# Patient Record
Sex: Female | Born: 1935 | Race: White | Hispanic: No
Health system: Southern US, Community
[De-identification: ages and names within clinical notes are randomized; demographics above are authoritative.]

## PROBLEM LIST (undated history)

## (undated) DIAGNOSIS — Z8719 Personal history of other diseases of the digestive system: Secondary | ICD-10-CM

## (undated) DIAGNOSIS — I4891 Unspecified atrial fibrillation: Secondary | ICD-10-CM

## (undated) DIAGNOSIS — C801 Malignant (primary) neoplasm, unspecified: Secondary | ICD-10-CM

## (undated) DIAGNOSIS — Z860100 Personal history of colon polyps, unspecified: Secondary | ICD-10-CM

## (undated) DIAGNOSIS — R600 Localized edema: Secondary | ICD-10-CM

## (undated) DIAGNOSIS — M1990 Unspecified osteoarthritis, unspecified site: Secondary | ICD-10-CM

## (undated) DIAGNOSIS — K589 Irritable bowel syndrome without diarrhea: Secondary | ICD-10-CM

## (undated) DIAGNOSIS — T7840XA Allergy, unspecified, initial encounter: Secondary | ICD-10-CM

## (undated) DIAGNOSIS — I2510 Atherosclerotic heart disease of native coronary artery without angina pectoris: Secondary | ICD-10-CM

## (undated) DIAGNOSIS — E559 Vitamin D deficiency, unspecified: Secondary | ICD-10-CM

## (undated) DIAGNOSIS — N952 Postmenopausal atrophic vaginitis: Secondary | ICD-10-CM

## (undated) DIAGNOSIS — I340 Nonrheumatic mitral (valve) insufficiency: Secondary | ICD-10-CM

## (undated) DIAGNOSIS — N390 Urinary tract infection, site not specified: Secondary | ICD-10-CM

## (undated) DIAGNOSIS — E119 Type 2 diabetes mellitus without complications: Secondary | ICD-10-CM

## (undated) DIAGNOSIS — R300 Dysuria: Secondary | ICD-10-CM

## (undated) DIAGNOSIS — K529 Noninfective gastroenteritis and colitis, unspecified: Secondary | ICD-10-CM

## (undated) DIAGNOSIS — E876 Hypokalemia: Secondary | ICD-10-CM

## (undated) DIAGNOSIS — I10 Essential (primary) hypertension: Secondary | ICD-10-CM

## (undated) DIAGNOSIS — D510 Vitamin B12 deficiency anemia due to intrinsic factor deficiency: Secondary | ICD-10-CM

## (undated) DIAGNOSIS — C4490 Unspecified malignant neoplasm of skin, unspecified: Secondary | ICD-10-CM

## (undated) DIAGNOSIS — K219 Gastro-esophageal reflux disease without esophagitis: Secondary | ICD-10-CM

## (undated) DIAGNOSIS — R3129 Other microscopic hematuria: Secondary | ICD-10-CM

## (undated) DIAGNOSIS — N6019 Diffuse cystic mastopathy of unspecified breast: Secondary | ICD-10-CM

## (undated) DIAGNOSIS — B3731 Acute candidiasis of vulva and vagina: Secondary | ICD-10-CM

## (undated) DIAGNOSIS — Z7901 Long term (current) use of anticoagulants: Secondary | ICD-10-CM

## (undated) DIAGNOSIS — K649 Unspecified hemorrhoids: Secondary | ICD-10-CM

## (undated) DIAGNOSIS — M722 Plantar fascial fibromatosis: Secondary | ICD-10-CM

## (undated) DIAGNOSIS — L8990 Pressure ulcer of unspecified site, unspecified stage: Secondary | ICD-10-CM

## (undated) DIAGNOSIS — B373 Candidiasis of vulva and vagina: Secondary | ICD-10-CM

## (undated) DIAGNOSIS — E785 Hyperlipidemia, unspecified: Secondary | ICD-10-CM

## (undated) DIAGNOSIS — H409 Unspecified glaucoma: Secondary | ICD-10-CM

## (undated) DIAGNOSIS — G4733 Obstructive sleep apnea (adult) (pediatric): Secondary | ICD-10-CM

## (undated) HISTORY — PX: REFRACTIVE SURGERY: SHX103

## (undated) HISTORY — DX: Irritable bowel syndrome, unspecified: K58.9

## (undated) HISTORY — DX: Vitamin B12 deficiency anemia due to intrinsic factor deficiency: D51.0

## (undated) HISTORY — DX: Essential (primary) hypertension: I10

## (undated) HISTORY — DX: Personal history of other diseases of the digestive system: Z87.19

## (undated) HISTORY — DX: Unspecified glaucoma: H40.9

## (undated) HISTORY — PX: BREAST BIOPSY: SHX20

## (undated) HISTORY — DX: Vitamin D deficiency, unspecified: E55.9

## (undated) HISTORY — DX: Postmenopausal atrophic vaginitis: N95.2

## (undated) HISTORY — DX: Localized edema: R60.0

## (undated) HISTORY — DX: Atherosclerotic heart disease of native coronary artery without angina pectoris: I25.10

## (undated) HISTORY — DX: Hyperlipidemia, unspecified: E78.5

## (undated) HISTORY — DX: Personal history of colon polyps, unspecified: Z86.0100

## (undated) HISTORY — PX: PACEMAKER PLACEMENT: SHX43

## (undated) HISTORY — DX: Malignant (primary) neoplasm, unspecified: C80.1

## (undated) HISTORY — DX: Hypokalemia: E87.6

## (undated) HISTORY — DX: Plantar fascial fibromatosis: M72.2

## (undated) HISTORY — DX: Candidiasis of vulva and vagina: B37.3

## (undated) HISTORY — DX: Nonrheumatic mitral (valve) insufficiency: I34.0

## (undated) HISTORY — DX: Gastro-esophageal reflux disease without esophagitis: K21.9

## (undated) HISTORY — DX: Long term (current) use of anticoagulants: Z79.01

## (undated) HISTORY — PX: OTHER SURGICAL HISTORY: SHX169

## (undated) HISTORY — PX: ABDOMINAL SURGERY: SHX537

## (undated) HISTORY — DX: Acute candidiasis of vulva and vagina: B37.31

## (undated) HISTORY — DX: Personal history of colonic polyps: Z86.010

## (undated) HISTORY — DX: Unspecified osteoarthritis, unspecified site: M19.90

## (undated) HISTORY — DX: Unspecified atrial fibrillation: I48.91

## (undated) HISTORY — PX: ABDOMINAL HYSTERECTOMY: SHX81

## (undated) HISTORY — PX: APPENDECTOMY: SHX54

## (undated) HISTORY — DX: Pressure ulcer of unspecified site, unspecified stage: L89.90

## (undated) HISTORY — DX: Other microscopic hematuria: R31.29

## (undated) HISTORY — DX: Diffuse cystic mastopathy of unspecified breast: N60.19

## (undated) HISTORY — DX: Unspecified hemorrhoids: K64.9

## (undated) HISTORY — DX: Allergy, unspecified, initial encounter: T78.40XA

## (undated) HISTORY — DX: Unspecified malignant neoplasm of skin, unspecified: C44.90

## (undated) HISTORY — DX: Obstructive sleep apnea (adult) (pediatric): G47.33

## (undated) HISTORY — DX: Type 2 diabetes mellitus without complications: E11.9

## (undated) HISTORY — DX: Urinary tract infection, site not specified: N39.0

## (undated) HISTORY — DX: Noninfective gastroenteritis and colitis, unspecified: K52.9

## (undated) HISTORY — PX: COLECTOMY: SHX59

## (undated) HISTORY — DX: Dysuria: R30.0

---
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
## 2004-02-17 ENCOUNTER — Ambulatory Visit: Payer: Self-pay | Admitting: Unknown Physician Specialty

## 2004-03-19 ENCOUNTER — Ambulatory Visit: Payer: Self-pay | Admitting: Unknown Physician Specialty

## 2004-05-10 ENCOUNTER — Ambulatory Visit: Payer: Self-pay | Admitting: Cardiology

## 2004-09-05 ENCOUNTER — Ambulatory Visit: Payer: Self-pay | Admitting: Cardiology

## 2004-09-09 ENCOUNTER — Ambulatory Visit: Payer: Self-pay | Admitting: Cardiology

## 2004-09-20 ENCOUNTER — Ambulatory Visit: Payer: Self-pay | Admitting: Unknown Physician Specialty

## 2004-10-02 ENCOUNTER — Ambulatory Visit: Payer: Self-pay | Admitting: Cardiology

## 2004-12-31 ENCOUNTER — Ambulatory Visit: Payer: Self-pay | Admitting: Internal Medicine

## 2005-10-24 ENCOUNTER — Ambulatory Visit: Payer: Self-pay | Admitting: Unknown Physician Specialty

## 2005-11-28 ENCOUNTER — Other Ambulatory Visit: Payer: Self-pay

## 2005-11-28 ENCOUNTER — Inpatient Hospital Stay: Payer: Self-pay | Admitting: Internal Medicine

## 2005-11-28 HISTORY — PX: OTHER SURGICAL HISTORY: SHX169

## 2005-11-29 ENCOUNTER — Other Ambulatory Visit: Payer: Self-pay

## 2005-11-30 ENCOUNTER — Other Ambulatory Visit: Payer: Self-pay

## 2006-01-14 ENCOUNTER — Encounter: Payer: Self-pay | Admitting: Internal Medicine

## 2006-01-17 ENCOUNTER — Encounter: Payer: Self-pay | Admitting: Internal Medicine

## 2006-01-21 ENCOUNTER — Ambulatory Visit: Payer: Self-pay | Admitting: Internal Medicine

## 2006-01-30 ENCOUNTER — Ambulatory Visit: Payer: Self-pay | Admitting: Internal Medicine

## 2006-01-30 ENCOUNTER — Ambulatory Visit (HOSPITAL_COMMUNITY): Admission: RE | Admit: 2006-01-30 | Discharge: 2006-01-31 | Payer: Self-pay | Admitting: Internal Medicine

## 2006-02-12 ENCOUNTER — Ambulatory Visit: Payer: Self-pay

## 2006-05-15 ENCOUNTER — Encounter: Payer: Self-pay | Admitting: Internal Medicine

## 2006-05-19 ENCOUNTER — Encounter: Payer: Self-pay | Admitting: Internal Medicine

## 2006-06-04 ENCOUNTER — Ambulatory Visit: Payer: Self-pay | Admitting: Unknown Physician Specialty

## 2006-06-08 ENCOUNTER — Ambulatory Visit: Payer: Self-pay | Admitting: Internal Medicine

## 2006-06-19 ENCOUNTER — Encounter: Payer: Self-pay | Admitting: Internal Medicine

## 2006-07-30 ENCOUNTER — Ambulatory Visit: Payer: Self-pay | Admitting: Unknown Physician Specialty

## 2006-08-05 ENCOUNTER — Encounter: Payer: Self-pay | Admitting: Internal Medicine

## 2006-08-19 ENCOUNTER — Encounter: Payer: Self-pay | Admitting: Internal Medicine

## 2006-09-02 ENCOUNTER — Ambulatory Visit: Payer: Self-pay | Admitting: Unknown Physician Specialty

## 2006-09-22 ENCOUNTER — Inpatient Hospital Stay: Payer: Self-pay | Admitting: *Deleted

## 2006-09-22 ENCOUNTER — Other Ambulatory Visit: Payer: Self-pay

## 2006-11-03 ENCOUNTER — Ambulatory Visit: Payer: Self-pay | Admitting: Unknown Physician Specialty

## 2007-01-28 ENCOUNTER — Ambulatory Visit: Payer: Self-pay | Admitting: General Surgery

## 2007-02-04 ENCOUNTER — Ambulatory Visit: Payer: Self-pay | Admitting: General Surgery

## 2007-10-04 ENCOUNTER — Ambulatory Visit: Payer: Self-pay | Admitting: Unknown Physician Specialty

## 2007-10-07 LAB — HM COLONOSCOPY: HM Colonoscopy: NORMAL

## 2008-01-06 ENCOUNTER — Ambulatory Visit: Payer: Self-pay | Admitting: Unknown Physician Specialty

## 2008-12-25 ENCOUNTER — Ambulatory Visit: Payer: Self-pay | Admitting: Internal Medicine

## 2009-01-01 ENCOUNTER — Ambulatory Visit: Payer: Self-pay

## 2009-01-17 ENCOUNTER — Ambulatory Visit: Payer: Self-pay | Admitting: Unknown Physician Specialty

## 2009-01-26 ENCOUNTER — Ambulatory Visit: Payer: Self-pay | Admitting: Urology

## 2010-03-05 ENCOUNTER — Ambulatory Visit: Payer: Self-pay | Admitting: Family Medicine

## 2010-05-19 LAB — HM MAMMOGRAPHY: HM Mammogram: NORMAL

## 2011-03-13 ENCOUNTER — Ambulatory Visit: Payer: Self-pay | Admitting: Unknown Physician Specialty

## 2011-04-08 LAB — BASIC METABOLIC PANEL
BUN: 19 mg/dL (ref 4–21)
Creatinine: 0.8 mg/dL (ref <=1.1)
Glucose: 119 mg/dL
Potassium: 3.7 mmol/L (ref 3.4–5.3)
Sodium: 136 mmol/L — AB (ref 137–147)

## 2011-04-08 LAB — HEMOGLOBIN A1C: Hgb A1c MFr Bld: 6.6 % — AB (ref 4.0–6.0)

## 2011-05-26 ENCOUNTER — Ambulatory Visit: Payer: Self-pay | Admitting: Internal Medicine

## 2011-05-28 DIAGNOSIS — I4891 Unspecified atrial fibrillation: Secondary | ICD-10-CM | POA: Diagnosis not present

## 2011-06-02 ENCOUNTER — Ambulatory Visit: Payer: Self-pay | Admitting: Internal Medicine

## 2011-06-04 DIAGNOSIS — D485 Neoplasm of uncertain behavior of skin: Secondary | ICD-10-CM | POA: Diagnosis not present

## 2011-06-04 DIAGNOSIS — Z85828 Personal history of other malignant neoplasm of skin: Secondary | ICD-10-CM | POA: Diagnosis not present

## 2011-06-04 DIAGNOSIS — L989 Disorder of the skin and subcutaneous tissue, unspecified: Secondary | ICD-10-CM | POA: Diagnosis not present

## 2011-06-13 ENCOUNTER — Ambulatory Visit (INDEPENDENT_AMBULATORY_CARE_PROVIDER_SITE_OTHER): Payer: Medicare Other | Admitting: Internal Medicine

## 2011-06-13 ENCOUNTER — Encounter: Payer: Self-pay | Admitting: Internal Medicine

## 2011-06-13 VITALS — BP 128/62 | HR 76 | Temp 97.8°F | Ht 62.0 in | Wt 148.0 lb

## 2011-06-13 DIAGNOSIS — I4821 Permanent atrial fibrillation: Secondary | ICD-10-CM | POA: Insufficient documentation

## 2011-06-13 DIAGNOSIS — R197 Diarrhea, unspecified: Secondary | ICD-10-CM

## 2011-06-13 DIAGNOSIS — R300 Dysuria: Secondary | ICD-10-CM

## 2011-06-13 DIAGNOSIS — D6869 Other thrombophilia: Secondary | ICD-10-CM | POA: Insufficient documentation

## 2011-06-13 DIAGNOSIS — K529 Noninfective gastroenteritis and colitis, unspecified: Secondary | ICD-10-CM | POA: Insufficient documentation

## 2011-06-13 DIAGNOSIS — I4891 Unspecified atrial fibrillation: Secondary | ICD-10-CM | POA: Diagnosis not present

## 2011-06-13 DIAGNOSIS — Z7901 Long term (current) use of anticoagulants: Secondary | ICD-10-CM | POA: Diagnosis not present

## 2011-06-13 DIAGNOSIS — N390 Urinary tract infection, site not specified: Secondary | ICD-10-CM | POA: Insufficient documentation

## 2011-06-13 DIAGNOSIS — I2510 Atherosclerotic heart disease of native coronary artery without angina pectoris: Secondary | ICD-10-CM | POA: Insufficient documentation

## 2011-06-13 LAB — POCT URINALYSIS DIPSTICK
Bilirubin, UA: NEGATIVE
Glucose, UA: NEGATIVE
Ketones, UA: NEGATIVE
Nitrite, UA: NEGATIVE
Protein, UA: NEGATIVE
Spec Grav, UA: 1.015
Urobilinogen, UA: 0.2
pH, UA: 7

## 2011-06-13 MED ORDER — CIPROFLOXACIN HCL 250 MG PO TABS: 250.0000 mg | ORAL_TABLET | Freq: Two times a day (BID) | ORAL | Status: AC

## 2011-06-13 NOTE — Assessment & Plan Note (Signed)
Patient reports stent placement in the past. Will obtain records from her previous physician. Blood pressure is well-controlled today. Will request records on recent lipid profile.

## 2011-06-13 NOTE — Assessment & Plan Note (Signed)
Symptoms and urinalysis consistent with urinary tract infection. Will send urine for culture. Will treat with Cipro. We discussed the use of antibiotics including Cipro can alter INR level. She will call immediately if any bleeding, or she has persistent dysuria, frequency, fever, or chills.

## 2011-06-13 NOTE — Assessment & Plan Note (Signed)
Symptoms seem most consistent with lactose intolerance. Patient reports extensive workup including colonoscopy and lab testing. Will obtain records from her previous physician.

## 2011-06-13 NOTE — Assessment & Plan Note (Signed)
Chronic anticoagulation with Coumadin secondary to atrial fibrillation. Goal INR 2-3. Will check INR as scheduled next week. We are starting antibiotics for urinary tract infection, and patient was cautioned to look out for bruising or bleeding.

## 2011-06-13 NOTE — Progress Notes (Signed)
Subjective:    Patient ID: Kara [name], female    DOB: September 07, 1922, 78 y.o.   MRN: [id number]  HPI 78 year old female with history of CAD, atrial fibrillation on chronic Coumadin therapy, hypertension, GERD, chronic diarrhea presents to establish care. Her physician recently left the area and she would like to transfer her care. She reports that generally she is feeling well. In regards to her atrial fibrillation, she notes that once or twice per week she has episodes of palpitations. During these episodes, she typically feels fatigued and short of breath. She does not have chest pain. She takes an extra dose of metoprolol at that time. She is currently followed by cardiology. She has a pacemaker in place. She notes that she has a history of coronary artery disease and is status post stent placement in the past. She is not sure which vessel this was in.  In regards to her hypertension, she notes that her blood pressure is generally well controlled. She denies any headache, chest pain.  In regards to her chronic diarrhea, she notes that almost on a daily basis she has loose stools. Her loose stools are made worse when she consumes dairy products. She has tried to eliminate dairy products and is using, and milk. However, she continues to eat a significant amount of cheese. She describes her stools as watery. Her stools are not bloody. She reports extensive GI evaluation including colonoscopy which was normal.  Outpatient Encounter Prescriptions as of 06/13/2011  Medication Sig Dispense Refill  . Calcium Carbonate-Vitamin D (CALCIUM-D) 600-400 MG-UNIT TABS Take 1 tablet by mouth daily.      . cholecalciferol (VITAMIN D) 1000 UNITS tablet Take 1,000 Units by mouth daily.      . cyanocobalamin (,VITAMIN B-12,) 1000 MCG/ML injection Inject 1,000 mcg into the muscle every 30 (thirty) days.      . metoprolol succinate (TOPROL-XL) 25 MG 24 hr tablet Take 25 mg by mouth daily.      . metoprolol tartrate  (LOPRESSOR) 25 MG tablet Take 25 mg by mouth daily as needed. Tachycardia      . pantoprazole (PROTONIX) 20 MG tablet Take 20 mg by mouth daily.      . potassium chloride (MICRO-K) 10 MEQ CR capsule Take 10 mEq by mouth 2 (two) times daily.      Marland Kitchen triamterene-hydrochlorothiazide (MAXZIDE-25) 37.5-25 MG per tablet Take 1 tablet by mouth daily.      Marland Kitchen warfarin (COUMADIN) 2 MG tablet Take 2 mg by mouth daily. 2 mg 4 days a week and 1 mg 3 days a week      . ciprofloxacin (CIPRO) 250 MG tablet Take 1 tablet (250 mg total) by mouth 2 (two) times daily.  14 tablet  0    Review of Systems  Constitutional: Negative for fever, chills, appetite change, fatigue and unexpected weight change.  HENT: Negative for ear pain, congestion, sore throat, trouble swallowing, neck pain, voice change and sinus pressure.   Eyes: Negative for visual disturbance.  Respiratory: Negative for cough, shortness of breath, wheezing and stridor.   Cardiovascular: Negative for chest pain, palpitations and leg swelling.  Gastrointestinal: Positive for diarrhea. Negative for nausea, vomiting, abdominal pain, constipation, blood in stool, abdominal distention and anal bleeding.  Genitourinary: Negative for dysuria and flank pain.  Musculoskeletal: Negative for myalgias, arthralgias and gait problem.  Skin: Negative for color change and rash.  Neurological: Negative for dizziness and headaches.  Hematological: Negative for adenopathy. Does not bruise/bleed easily.  Psychiatric/Behavioral:  Negative for suicidal ideas, sleep disturbance and dysphoric mood. The patient is not nervous/anxious.       BP 128/62  Pulse 76  Temp(Src) 97.8 F (36.6 C) (Oral)  Ht 5\' 2"  (1.575 m)  Wt 148 lb (67.132 kg)  BMI 27.07 kg/m2  SpO2 94%  Objective:   Physical Exam  Constitutional: She is oriented to person, place, and time. She appears well-developed and well-nourished. No distress.  HENT:  Head: Normocephalic and atraumatic.  Right  Ear: External ear normal.  Left Ear: External ear normal.  Nose: Nose normal.  Mouth/Throat: Oropharynx is clear and moist. No oropharyngeal exudate.  Eyes: Conjunctivae are normal. Pupils are equal, round, and reactive to light. Right eye exhibits no discharge. Left eye exhibits no discharge. No scleral icterus.  Neck: Normal range of motion. Neck supple. No tracheal deviation present. No thyromegaly present.  Cardiovascular: Normal rate, regular rhythm, normal heart sounds and intact distal pulses.  Exam reveals no gallop and no friction rub.   No murmur heard. Pulmonary/Chest: Effort normal and breath sounds normal. No respiratory distress. She has no wheezes. She has no rales. She exhibits no tenderness.  Abdominal: Soft. Bowel sounds are normal. She exhibits no distension and no mass. There is no tenderness. There is no rebound and no guarding.  Musculoskeletal: Normal range of motion. She exhibits no edema and no tenderness.  Lymphadenopathy:    She has no cervical adenopathy.  Neurological: She is alert and oriented to person, place, and time. No cranial nerve deficit. She exhibits normal muscle tone. Coordination normal.  Skin: Skin is warm and dry. No rash noted. She is not diaphoretic. No erythema. No pallor.  Psychiatric: She has a normal mood and affect. Her behavior is normal. Judgment and thought content normal.          Assessment & Plan:

## 2011-06-13 NOTE — Assessment & Plan Note (Signed)
Patient with intermittent atrial fibrillation. She reports this occurs once or twice per week. She is currently rate controlled with metoprolol. She is followed by cardiology. She is on warfarin. Will obtain records from her previous physician. Will continue warfarin and plan to check INR as scheduled next week.

## 2011-06-15 LAB — URINE CULTURE: Colony Count: 1000

## 2011-06-19 DIAGNOSIS — I495 Sick sinus syndrome: Secondary | ICD-10-CM | POA: Diagnosis not present

## 2011-06-23 ENCOUNTER — Other Ambulatory Visit (INDEPENDENT_AMBULATORY_CARE_PROVIDER_SITE_OTHER): Payer: Medicare Other | Admitting: *Deleted

## 2011-06-23 DIAGNOSIS — Z7901 Long term (current) use of anticoagulants: Secondary | ICD-10-CM | POA: Diagnosis not present

## 2011-06-23 LAB — PROTIME-INR
INR: 2.1 ratio — ABNORMAL HIGH (ref 0.8–1.0)
Prothrombin Time: 22.7 s — ABNORMAL HIGH (ref 10.2–12.4)

## 2011-06-25 ENCOUNTER — Telehealth: Payer: Self-pay | Admitting: Internal Medicine

## 2011-06-25 NOTE — Telephone Encounter (Signed)
Discussed w/pt, see result note

## 2011-06-25 NOTE — Telephone Encounter (Signed)
Wanting to know when she should come back for her her next pro time. Patient also wants to now her lab results.

## 2011-07-23 ENCOUNTER — Other Ambulatory Visit (INDEPENDENT_AMBULATORY_CARE_PROVIDER_SITE_OTHER): Payer: Medicare Other | Admitting: *Deleted

## 2011-07-23 DIAGNOSIS — I4891 Unspecified atrial fibrillation: Secondary | ICD-10-CM | POA: Diagnosis not present

## 2011-07-23 DIAGNOSIS — Z7901 Long term (current) use of anticoagulants: Secondary | ICD-10-CM

## 2011-07-23 LAB — PROTIME-INR
INR: 2.36 — ABNORMAL HIGH (ref <1.50)
Prothrombin Time: 26.6 seconds — ABNORMAL HIGH (ref 11.6–15.2)

## 2011-07-30 ENCOUNTER — Encounter: Payer: Self-pay | Admitting: Internal Medicine

## 2011-07-30 DIAGNOSIS — E559 Vitamin D deficiency, unspecified: Secondary | ICD-10-CM | POA: Insufficient documentation

## 2011-08-01 ENCOUNTER — Telehealth: Payer: Self-pay | Admitting: Internal Medicine

## 2011-08-01 MED ORDER — CYANOCOBALAMIN 1000 MCG/ML IJ SOLN: 1000.0000 ug | INTRAMUSCULAR | Status: DC

## 2011-08-01 NOTE — Telephone Encounter (Signed)
Done,  Patient informed

## 2011-08-01 NOTE — Telephone Encounter (Signed)
Pt went to pick up her b12 rx and it was not at the drug store.  Please advise when this is called in [facility]   Pt wanted to get the results of the urine test she had done last week [phone]

## 2011-08-14 ENCOUNTER — Other Ambulatory Visit (INDEPENDENT_AMBULATORY_CARE_PROVIDER_SITE_OTHER): Payer: Medicare Other | Admitting: *Deleted

## 2011-08-14 DIAGNOSIS — Z7901 Long term (current) use of anticoagulants: Secondary | ICD-10-CM | POA: Diagnosis not present

## 2011-08-14 LAB — PROTIME-INR
INR: 2.1 ratio — ABNORMAL HIGH (ref 0.8–1.0)
Prothrombin Time: 23.6 s — ABNORMAL HIGH (ref 10.2–12.4)

## 2011-08-20 ENCOUNTER — Telehealth: Payer: Self-pay | Admitting: *Deleted

## 2011-08-20 NOTE — Telephone Encounter (Signed)
Patient never got a call back about her PT/INR from last week. It looks like it was not routed to you. Please advise.

## 2011-08-20 NOTE — Telephone Encounter (Signed)
Patient notified. Lab appt scheduled for one month.

## 2011-08-20 NOTE — Telephone Encounter (Signed)
INR was perfect. 2.1 on 3/28.  Can repeat in 66month. It was not routed to me. Please apologize to pt for me. Not sure why not routed correctly.

## 2011-08-27 DIAGNOSIS — E782 Mixed hyperlipidemia: Secondary | ICD-10-CM | POA: Diagnosis not present

## 2011-08-27 DIAGNOSIS — I059 Rheumatic mitral valve disease, unspecified: Secondary | ICD-10-CM | POA: Diagnosis not present

## 2011-08-27 DIAGNOSIS — I119 Hypertensive heart disease without heart failure: Secondary | ICD-10-CM | POA: Diagnosis not present

## 2011-08-27 DIAGNOSIS — I4891 Unspecified atrial fibrillation: Secondary | ICD-10-CM | POA: Diagnosis not present

## 2011-09-08 ENCOUNTER — Other Ambulatory Visit (INDEPENDENT_AMBULATORY_CARE_PROVIDER_SITE_OTHER): Payer: Medicare Other | Admitting: *Deleted

## 2011-09-08 ENCOUNTER — Telehealth: Payer: Self-pay | Admitting: *Deleted

## 2011-09-08 DIAGNOSIS — Z7901 Long term (current) use of anticoagulants: Secondary | ICD-10-CM

## 2011-09-08 DIAGNOSIS — N390 Urinary tract infection, site not specified: Secondary | ICD-10-CM

## 2011-09-08 LAB — POCT URINALYSIS DIPSTICK
Bilirubin, UA: NEGATIVE
Blood, UA: NEGATIVE
Glucose, UA: NEGATIVE
Ketones, UA: NEGATIVE
Nitrite, UA: NEGATIVE
Protein, UA: NEGATIVE
Spec Grav, UA: 1.015
Urobilinogen, UA: 0.2
pH, UA: 7

## 2011-09-08 LAB — PROTIME-INR
INR: 2.7 ratio — ABNORMAL HIGH (ref 0.8–1.0)
Prothrombin Time: 29.4 s — ABNORMAL HIGH (ref 10.2–12.4)

## 2011-09-08 NOTE — Telephone Encounter (Signed)
Patient came in today for labs, while she was here she complained of having a lot burning with urination. She left urine specimen. Results are in. I sent for culture.

## 2011-09-08 NOTE — Telephone Encounter (Signed)
Please send for culture. Treat with Cipro 250mg  po bid x 5 days (confirm no allergy)

## 2011-09-08 NOTE — Telephone Encounter (Signed)
Left message asking patient to call back

## 2011-09-08 NOTE — Progress Notes (Signed)
Addended by: Jobie Quaker on: 09/08/2011 03:01 PM   Modules accepted: Orders

## 2011-09-09 ENCOUNTER — Telehealth: Payer: Self-pay | Admitting: *Deleted

## 2011-09-09 MED ORDER — CIPROFLOXACIN HCL 250 MG PO TABS: 250.0000 mg | ORAL_TABLET | Freq: Two times a day (BID) | ORAL | Status: DC

## 2011-09-09 NOTE — Telephone Encounter (Signed)
Patient notified- Rx called to pharmacy. 

## 2011-09-09 NOTE — Telephone Encounter (Signed)
Patient called back regarding her protime appt that was scheduled for Friday. She says that she was mistaken and will not be

## 2011-09-09 NOTE — Telephone Encounter (Signed)
She was mistaken and will not be leaving to go out of town until Tuesday of next week. She reschedule pt/inr for Monday of next week.

## 2011-09-10 DIAGNOSIS — I4891 Unspecified atrial fibrillation: Secondary | ICD-10-CM | POA: Diagnosis not present

## 2011-09-10 DIAGNOSIS — I059 Rheumatic mitral valve disease, unspecified: Secondary | ICD-10-CM | POA: Diagnosis not present

## 2011-09-10 LAB — URINE CULTURE: Colony Count: 8000

## 2011-09-11 ENCOUNTER — Ambulatory Visit (INDEPENDENT_AMBULATORY_CARE_PROVIDER_SITE_OTHER): Payer: Medicare Other | Admitting: Internal Medicine

## 2011-09-11 ENCOUNTER — Encounter: Payer: Self-pay | Admitting: Internal Medicine

## 2011-09-11 VITALS — BP 100/60 | HR 68 | Ht 62.0 in | Wt 148.0 lb

## 2011-09-11 DIAGNOSIS — I10 Essential (primary) hypertension: Secondary | ICD-10-CM | POA: Diagnosis not present

## 2011-09-11 DIAGNOSIS — E119 Type 2 diabetes mellitus without complications: Secondary | ICD-10-CM | POA: Diagnosis not present

## 2011-09-11 DIAGNOSIS — I4891 Unspecified atrial fibrillation: Secondary | ICD-10-CM

## 2011-09-11 DIAGNOSIS — N390 Urinary tract infection, site not specified: Secondary | ICD-10-CM

## 2011-09-11 DIAGNOSIS — Z7901 Long term (current) use of anticoagulants: Secondary | ICD-10-CM | POA: Diagnosis not present

## 2011-09-11 DIAGNOSIS — D510 Vitamin B12 deficiency anemia due to intrinsic factor deficiency: Secondary | ICD-10-CM | POA: Diagnosis not present

## 2011-09-11 NOTE — Assessment & Plan Note (Signed)
Recent Coumadin level was therapeutic. We'll plan to repeat INR with labs in May 2013.

## 2011-09-11 NOTE — Progress Notes (Signed)
Subjective:    Patient ID: Kara [name], female    DOB: [birth year], 78 y.o.   MRN: [id number]  HPI 78 year old female with history of atrial fibrillation presents for followup. She reports she is generally doing well. She notes that yesterday she had run of atrial fibrillation and was fatigued. She denies chest pain during that event. She did have palpitations. The episode has since resolved. She continues on anticoagulation with Coumadin. Her last Coumadin level was therapeutic.  Otherwise, she reports she is feeling well. She notes she is planning to travel in the upcoming weeks.  She was seen earlier this week with symptoms of dysuria. Urine culture returned and was negative. We discussed stopping Cipro today.  Outpatient Encounter Prescriptions as of 09/11/2011  Medication Sig Dispense Refill  . Calcium Carbonate-Vitamin D (CALCIUM-D) 600-400 MG-UNIT TABS Take 1 tablet by mouth daily.      . cholecalciferol (VITAMIN D) 1000 UNITS tablet Take 1,000 Units by mouth daily.      . cyanocobalamin (,VITAMIN B-12,) 1000 MCG/ML injection Inject 1 mL (1,000 mcg total) into the muscle every 30 (thirty) days.  10 mL  1  . metoprolol succinate (TOPROL-XL) 25 MG 24 hr tablet Take 25 mg by mouth daily.      . metoprolol tartrate (LOPRESSOR) 25 MG tablet Take 25 mg by mouth daily as needed. Tachycardia      . pantoprazole (PROTONIX) 20 MG tablet Take 20 mg by mouth daily.      . potassium chloride (MICRO-K) 10 MEQ CR capsule Take 10 mEq by mouth 2 (two) times daily.      Marland Kitchen triamterene-hydrochlorothiazide (MAXZIDE-25) 37.5-25 MG per tablet Take 1 tablet by mouth daily.      Marland Kitchen warfarin (COUMADIN) 2 MG tablet Take 2 mg by mouth daily. 2 mg 4 days a week and 1 mg 3 days a week      . DISCONTD: ciprofloxacin (CIPRO) 250 MG tablet Take 1 tablet (250 mg total) by mouth 2 (two) times daily.  10 tablet  0    Review of Systems  Constitutional: Positive for fatigue. Negative for fever, chills, appetite change and  unexpected weight change.  HENT: Negative for ear pain, congestion, sore throat, trouble swallowing, neck pain, voice change and sinus pressure.   Eyes: Negative for visual disturbance.  Respiratory: Negative for cough, shortness of breath, wheezing and stridor.   Cardiovascular: Positive for palpitations. Negative for chest pain and leg swelling.  Gastrointestinal: Negative for nausea, vomiting, abdominal pain, diarrhea, constipation, blood in stool, abdominal distention and anal bleeding.  Genitourinary: Negative for dysuria and flank pain.  Musculoskeletal: Negative for myalgias, arthralgias and gait problem.  Skin: Negative for color change and rash.  Neurological: Negative for dizziness and headaches.  Hematological: Negative for adenopathy. Does not bruise/bleed easily.  Psychiatric/Behavioral: Negative for suicidal ideas, sleep disturbance and dysphoric mood. The patient is not nervous/anxious.    BP 100/60  Pulse 68  Ht 5\' 2"  (1.575 m)  Wt 148 lb (67.132 kg)  BMI 27.07 kg/m2     Objective:   Physical Exam  Constitutional: She is oriented to person, place, and time. She appears well-developed and well-nourished. No distress.  HENT:  Head: Normocephalic and atraumatic.  Right Ear: External ear normal.  Left Ear: External ear normal.  Nose: Nose normal.  Mouth/Throat: Oropharynx is clear and moist. No oropharyngeal exudate.  Eyes: Conjunctivae are normal. Pupils are equal, round, and reactive to light. Right eye exhibits no discharge. Left  eye exhibits no discharge. No scleral icterus.  Neck: Normal range of motion. Neck supple. No tracheal deviation present. No thyromegaly present.  Cardiovascular: Normal rate, regular rhythm, normal heart sounds and intact distal pulses.  Exam reveals no gallop and no friction rub.   No murmur heard. Pulmonary/Chest: Effort normal and breath sounds normal. No respiratory distress. She has no wheezes. She has no rales. She exhibits no  tenderness.  Musculoskeletal: Normal range of motion. She exhibits no edema and no tenderness.  Lymphadenopathy:    She has no cervical adenopathy.  Neurological: She is alert and oriented to person, place, and time. No cranial nerve deficit. She exhibits normal muscle tone. Coordination normal.  Skin: Skin is warm and dry. No rash noted. She is not diaphoretic. No erythema. No pallor.  Psychiatric: She has a normal mood and affect. Her behavior is normal. Judgment and thought content normal.          Assessment & Plan:

## 2011-09-11 NOTE — Assessment & Plan Note (Signed)
Currently appears to be in normal sinus rhythm. However, reports episode of atrial fibrillation yesterday for which she took metoprolol with resolution. She continues on anticoagulation with Coumadin. Last Coumadin level 09/08/2011 was therapeutic. She will repeat Coumadin level in one month.

## 2011-09-11 NOTE — Patient Instructions (Signed)
Plan to recheck coumadin level on 5/22.

## 2011-09-11 NOTE — Assessment & Plan Note (Signed)
Symptoms including minimal dysuria have resolved. Urine culture was negative. Will stop Cipro. Will monitor for any recurrent symptoms.

## 2011-09-12 ENCOUNTER — Other Ambulatory Visit: Payer: Medicare Other

## 2011-09-15 ENCOUNTER — Other Ambulatory Visit: Payer: Medicare Other

## 2011-10-14 ENCOUNTER — Other Ambulatory Visit (INDEPENDENT_AMBULATORY_CARE_PROVIDER_SITE_OTHER): Payer: Medicare Other | Admitting: *Deleted

## 2011-10-14 DIAGNOSIS — I10 Essential (primary) hypertension: Secondary | ICD-10-CM

## 2011-10-14 DIAGNOSIS — D510 Vitamin B12 deficiency anemia due to intrinsic factor deficiency: Secondary | ICD-10-CM

## 2011-10-14 DIAGNOSIS — Z7901 Long term (current) use of anticoagulants: Secondary | ICD-10-CM | POA: Diagnosis not present

## 2011-10-14 DIAGNOSIS — IMO0001 Reserved for inherently not codable concepts without codable children: Secondary | ICD-10-CM | POA: Diagnosis not present

## 2011-10-14 LAB — CBC WITH DIFFERENTIAL/PLATELET
Basophils Absolute: 0 10*3/uL (ref 0.0–0.1)
Basophils Relative: 0.6 % (ref 0.0–3.0)
Eosinophils Absolute: 0.1 10*3/uL (ref 0.0–0.7)
Eosinophils Relative: 1.4 % (ref 0.0–5.0)
HCT: 42.4 % (ref 36.0–46.0)
Hemoglobin: 14.3 g/dL (ref 12.0–15.0)
Lymphocytes Relative: 39.2 % (ref 12.0–46.0)
Lymphs Abs: 2.5 10*3/uL (ref 0.7–4.0)
MCHC: 33.7 g/dL (ref 30.0–36.0)
MCV: 91.3 fl (ref 78.0–100.0)
Monocytes Absolute: 0.3 10*3/uL (ref 0.1–1.0)
Monocytes Relative: 5.5 % (ref 3.0–12.0)
Neutro Abs: 3.4 10*3/uL (ref 1.4–7.7)
Neutrophils Relative %: 53.3 % (ref 43.0–77.0)
Platelets: 194 10*3/uL (ref 150.0–400.0)
RBC: 4.65 Mil/uL (ref 3.87–5.11)
RDW: 13.1 % (ref 11.5–14.6)
WBC: 6.3 10*3/uL (ref 4.5–10.5)

## 2011-10-14 LAB — PROTIME-INR
INR: 2.1 ratio — ABNORMAL HIGH (ref 0.8–1.0)
Prothrombin Time: 23.3 s — ABNORMAL HIGH (ref 10.2–12.4)

## 2011-10-14 LAB — LIPID PANEL
Cholesterol: 218 mg/dL — ABNORMAL HIGH (ref 0–200)
HDL: 48.8 mg/dL (ref >39.00)
Total CHOL/HDL Ratio: 4
Triglycerides: 290 mg/dL — ABNORMAL HIGH (ref 0.0–149.0)
VLDL: 58 mg/dL — ABNORMAL HIGH (ref 0.0–40.0)

## 2011-10-14 LAB — COMPREHENSIVE METABOLIC PANEL
ALT: 14 U/L (ref 0–35)
AST: 21 U/L (ref 0–37)
Albumin: 3.5 g/dL (ref 3.5–5.2)
Alkaline Phosphatase: 46 U/L (ref 39–117)
BUN: 25 mg/dL — ABNORMAL HIGH (ref 6–23)
CO2: 24 mEq/L (ref 19–32)
Calcium: 8.8 mg/dL (ref 8.4–10.5)
Chloride: 103 mEq/L (ref 96–112)
Creatinine, Ser: 0.8 mg/dL (ref 0.4–1.2)
GFR: 69.11 mL/min (ref >60.00)
Glucose, Bld: 133 mg/dL — ABNORMAL HIGH (ref 70–99)
Potassium: 3.4 mEq/L — ABNORMAL LOW (ref 3.5–5.1)
Sodium: 139 mEq/L (ref 135–145)
Total Bilirubin: 1.2 mg/dL (ref 0.3–1.2)
Total Protein: 6.3 g/dL (ref 6.0–8.3)

## 2011-10-14 LAB — LDL CHOLESTEROL, DIRECT: Direct LDL: 113.7 mg/dL

## 2011-10-14 LAB — HEMOGLOBIN A1C: Hgb A1c MFr Bld: 6.4 % (ref 4.6–6.5)

## 2011-10-17 DIAGNOSIS — I10 Essential (primary) hypertension: Secondary | ICD-10-CM | POA: Diagnosis not present

## 2011-10-17 DIAGNOSIS — I739 Peripheral vascular disease, unspecified: Secondary | ICD-10-CM | POA: Diagnosis not present

## 2011-11-11 ENCOUNTER — Other Ambulatory Visit (INDEPENDENT_AMBULATORY_CARE_PROVIDER_SITE_OTHER): Payer: Medicare Other | Admitting: *Deleted

## 2011-11-11 DIAGNOSIS — Z23 Encounter for immunization: Secondary | ICD-10-CM | POA: Diagnosis not present

## 2011-11-11 DIAGNOSIS — Z7901 Long term (current) use of anticoagulants: Secondary | ICD-10-CM

## 2011-11-11 LAB — PROTIME-INR
INR: 2.2 ratio — ABNORMAL HIGH (ref 0.8–1.0)
Prothrombin Time: 24.4 s — ABNORMAL HIGH (ref 10.2–12.4)

## 2011-11-18 DIAGNOSIS — I495 Sick sinus syndrome: Secondary | ICD-10-CM | POA: Diagnosis not present

## 2011-11-24 ENCOUNTER — Other Ambulatory Visit: Payer: Self-pay | Admitting: Internal Medicine

## 2011-11-24 DIAGNOSIS — Z7901 Long term (current) use of anticoagulants: Secondary | ICD-10-CM

## 2011-11-28 ENCOUNTER — Telehealth: Payer: Self-pay | Admitting: Internal Medicine

## 2011-11-28 NOTE — Telephone Encounter (Signed)
Tylenol would be the safest option. If pain severe, then needs to be seen.

## 2011-11-28 NOTE — Telephone Encounter (Signed)
Caller: Juliza/Patient; PCP: Ronna Polio; CB#: [phone]; ; ; Call regarding Patient Twisted Her Back Yesterday and Still in Pain. Patient Is On Coumadin and Has To Be Careful What She Takes; Onset- 11/27/11 Afebrile. Pt states she twisted her back doing something in her closet and is having on/off back pain. Emergent s/s of Back s/s protocol r/o.  Pt to see provider within 2 weeks. Pt took Tylenol earlier and started to feel bad afterwards, she wants to know what she can take for pain since she is on Coumadin. [facility].

## 2011-11-28 NOTE — Telephone Encounter (Signed)
Patient advised as instructed via telephone. 

## 2011-11-29 ENCOUNTER — Emergency Department: Payer: Self-pay | Admitting: Emergency Medicine

## 2011-11-29 DIAGNOSIS — M545 Low back pain, unspecified: Secondary | ICD-10-CM | POA: Diagnosis not present

## 2011-11-29 DIAGNOSIS — R1031 Right lower quadrant pain: Secondary | ICD-10-CM | POA: Diagnosis not present

## 2011-11-29 DIAGNOSIS — K859 Acute pancreatitis without necrosis or infection, unspecified: Secondary | ICD-10-CM | POA: Diagnosis not present

## 2011-11-29 DIAGNOSIS — R197 Diarrhea, unspecified: Secondary | ICD-10-CM | POA: Diagnosis not present

## 2011-11-29 DIAGNOSIS — R112 Nausea with vomiting, unspecified: Secondary | ICD-10-CM | POA: Diagnosis not present

## 2011-11-29 DIAGNOSIS — R6889 Other general symptoms and signs: Secondary | ICD-10-CM | POA: Diagnosis not present

## 2011-11-29 LAB — URINALYSIS, COMPLETE
Bacteria: NONE SEEN
Bilirubin,UR: NEGATIVE
Glucose,UR: NEGATIVE mg/dL (ref 0–75)
Leukocyte Esterase: NEGATIVE
Nitrite: NEGATIVE
Ph: 7 (ref 4.5–8.0)
Protein: NEGATIVE
RBC,UR: 2 /HPF (ref 0–5)
Specific Gravity: 1.006 (ref 1.003–1.030)
Squamous Epithelial: <1
WBC UR: 1 /HPF (ref 0–5)

## 2011-11-29 LAB — CBC
HCT: 41.8 % (ref 35.0–47.0)
HGB: 13.8 g/dL (ref 12.0–16.0)
MCH: 30.4 pg (ref 26.0–34.0)
MCHC: 32.9 g/dL (ref 32.0–36.0)
MCV: 92 fL (ref 80–100)
Platelet: 171 10*3/uL (ref 150–440)
RBC: 4.53 10*6/uL (ref 3.80–5.20)
RDW: 13 % (ref 11.5–14.5)
WBC: 13.8 10*3/uL — ABNORMAL HIGH (ref 3.6–11.0)

## 2011-11-29 LAB — COMPREHENSIVE METABOLIC PANEL
Albumin: 3.3 g/dL — ABNORMAL LOW (ref 3.4–5.0)
Alkaline Phosphatase: 70 U/L (ref 50–136)
Anion Gap: 7 (ref 7–16)
BUN: 15 mg/dL (ref 7–18)
Bilirubin,Total: 1.9 mg/dL — ABNORMAL HIGH (ref 0.2–1.0)
Calcium, Total: 8.7 mg/dL (ref 8.5–10.1)
Chloride: 96 mmol/L — ABNORMAL LOW (ref 98–107)
Co2: 32 mmol/L (ref 21–32)
Creatinine: 0.93 mg/dL (ref 0.60–1.30)
EGFR (African American): >60
EGFR (Non-African Amer.): 55 — ABNORMAL LOW
Glucose: 141 mg/dL — ABNORMAL HIGH (ref 65–99)
Osmolality: 273 (ref 275–301)
Potassium: 3.2 mmol/L — ABNORMAL LOW (ref 3.5–5.1)
SGOT(AST): 23 U/L (ref 15–37)
SGPT (ALT): 21 U/L
Sodium: 135 mmol/L — ABNORMAL LOW (ref 136–145)
Total Protein: 7 g/dL (ref 6.4–8.2)

## 2011-11-29 LAB — LIPASE, BLOOD: Lipase: 802 U/L — ABNORMAL HIGH (ref 73–393)

## 2011-12-01 ENCOUNTER — Telehealth: Payer: Self-pay | Admitting: Internal Medicine

## 2011-12-01 ENCOUNTER — Encounter: Payer: Self-pay | Admitting: Internal Medicine

## 2011-12-01 ENCOUNTER — Ambulatory Visit (INDEPENDENT_AMBULATORY_CARE_PROVIDER_SITE_OTHER): Payer: Medicare Other | Admitting: Internal Medicine

## 2011-12-01 VITALS — BP 124/84 | HR 130 | Temp 98.6°F | Ht 62.0 in | Wt 145.5 lb

## 2011-12-01 DIAGNOSIS — R1031 Right lower quadrant pain: Secondary | ICD-10-CM | POA: Diagnosis not present

## 2011-12-01 DIAGNOSIS — K859 Acute pancreatitis without necrosis or infection, unspecified: Secondary | ICD-10-CM | POA: Diagnosis not present

## 2011-12-01 DIAGNOSIS — R109 Unspecified abdominal pain: Secondary | ICD-10-CM | POA: Diagnosis not present

## 2011-12-01 LAB — COMPREHENSIVE METABOLIC PANEL
Albumin: 2.6 g/dL — ABNORMAL LOW (ref 3.4–5.0)
Alkaline Phosphatase: 68 U/L (ref 50–136)
Anion Gap: 11 (ref 7–16)
BUN: 11 mg/dL (ref 7–18)
Bilirubin,Total: 1.8 mg/dL — ABNORMAL HIGH (ref 0.2–1.0)
Calcium, Total: 8.1 mg/dL — ABNORMAL LOW (ref 8.5–10.1)
Chloride: 100 mmol/L (ref 98–107)
Co2: 26 mmol/L (ref 21–32)
Creatinine: 0.76 mg/dL (ref 0.60–1.30)
EGFR (African American): >60
EGFR (Non-African Amer.): >60
Glucose: 151 mg/dL — ABNORMAL HIGH (ref 65–99)
Osmolality: 276 (ref 275–301)
Potassium: 3.1 mmol/L — ABNORMAL LOW (ref 3.5–5.1)
SGOT(AST): 19 U/L (ref 15–37)
SGPT (ALT): 15 U/L
Sodium: 137 mmol/L (ref 136–145)
Total Protein: 6.6 g/dL (ref 6.4–8.2)

## 2011-12-01 LAB — CBC WITH DIFFERENTIAL/PLATELET
Basophil #: 0 10*3/uL (ref 0.0–0.1)
Basophil %: 0.2 %
Eosinophil #: 0 10*3/uL (ref 0.0–0.7)
Eosinophil %: 0 %
HCT: 39.6 % (ref 35.0–47.0)
HGB: 13 g/dL (ref 12.0–16.0)
Lymphocyte #: 1.8 10*3/uL (ref 1.0–3.6)
Lymphocyte %: 12.5 %
MCH: 30.5 pg (ref 26.0–34.0)
MCHC: 32.9 g/dL (ref 32.0–36.0)
MCV: 93 fL (ref 80–100)
Monocyte #: 1.4 x10 3/mm — ABNORMAL HIGH (ref 0.2–0.9)
Monocyte %: 10 %
Neutrophil #: 10.9 10*3/uL — ABNORMAL HIGH (ref 1.4–6.5)
Neutrophil %: 77.3 %
Platelet: 163 10*3/uL (ref 150–440)
RBC: 4.26 10*6/uL (ref 3.80–5.20)
RDW: 13.2 % (ref 11.5–14.5)
WBC: 14.1 10*3/uL — ABNORMAL HIGH (ref 3.6–11.0)

## 2011-12-01 LAB — URINALYSIS, COMPLETE
Bacteria: NONE SEEN
Bilirubin,UR: NEGATIVE
Glucose,UR: NEGATIVE mg/dL (ref 0–75)
Leukocyte Esterase: NEGATIVE
Nitrite: NEGATIVE
Ph: 6 (ref 4.5–8.0)
Protein: NEGATIVE
RBC,UR: 5 /HPF (ref 0–5)
Specific Gravity: 1.011 (ref 1.003–1.030)
Squamous Epithelial: 1
WBC UR: 2 /HPF (ref 0–5)

## 2011-12-01 LAB — MAGNESIUM: Magnesium: 2 mg/dL

## 2011-12-01 LAB — LIPASE, BLOOD: Lipase: 55 U/L — ABNORMAL LOW

## 2011-12-01 LAB — PROTIME-INR
INR: 1.1
Prothrombin Time: 14.6 secs (ref 11.5–14.7)

## 2011-12-01 LAB — APTT: Activated PTT: 32.7 secs (ref 23.6–35.9)

## 2011-12-01 NOTE — Assessment & Plan Note (Signed)
With concurrent enteritis.  Patient should not have been sent home from ER on Saturday.  Have spoken with Dr. Monte Fantasia war, hospitalist on call who has arranged for immediate ER evaluation followed by admission for iv antibiotics IV fluids resuscitation and bowel rest.

## 2011-12-01 NOTE — Telephone Encounter (Signed)
Caller: Huntley Dec / patient; PCP: Ronna Polio; CB#: [phone]; Call regarding Having Abdominal Pain; Seen in ED 11/29/11.  Dx w/ pancreatic inflammation at ED. Pain up to 6 of 10 with movement.  Has not been monitoring blood glucose.  Advised see in 24 hours per Diabetes:  GI Problems protocol.  Appointment with Dr. Darrick Huntsman on 12/01/11 at 1615.

## 2011-12-01 NOTE — Progress Notes (Signed)
Patient ID: Kara [name], female   DOB: Nov 25, 1922, 78 y.o.   MRN: [id number] Patient Active Problem List  Diagnosis  . Chronic anticoagulation  . Urinary tract infection  . Atrial fibrillation  . CAD (coronary artery disease)  . Chronic diarrhea  . Vitamin d deficiency  . Pancreatitis, acute    Subjective:  CC:   Chief Complaint  Patient presents with  . Abdominal Pain    HPI:   Kara [name] a 78 y.o. female who presents Er follow up for visit on Saturday for sudden onset of nausea and vomiting  Which started on Friday afternoon.  She was Not able to keep anything down all day Friday .  woke up Saturday with RLQ pain and continued nausea and vomiting. .  Had taken tylenol and citrate of magnesium on Friday which she threw up..  Was evaluated on Saturday night in the ER and given 1/2 L NS , Was told by Dr. Shaune Pollack she had pancreatitis.,  Lipase was 806 and CT abd/pelvis showed enteritis.  Given #2 vicodin  and printout for liquid diet and sent home.  Since then has been having severe pain, abdominal distension.  Tolerating liquids.  Liquid black stools.    Past Medical History  Diagnosis Date  . Hypertension   . Atrial fibrillation   . CAD (coronary artery disease)   . Chronic diarrhea   . GERD (gastroesophageal reflux disease)   . Hyperlipidemia   . History of colon polyps   . Glaucoma   . Fibrocystic breast disease   . Cancer     UTERINE  . Allergy   . IBS (irritable bowel syndrome)   . Plantar fasciitis   . Edema of lower extremity   . Sleep apnea, obstructive   . Hematuria     Past Surgical History  Procedure Date  . Breast biopsy 1970's  . Appendectomy 1940's  . Tonsillectomy 1936  . Abdominal hysterectomy 1980's  . Refractive surgery     for bilateral glaumoma  . Pacemaker placement   . Abdominal surgery     for villous polyp,,,many years ago  . Ascad, s/p ptca 11/28/2005    MID LESION          The following portions of the patient's history were  reviewed and updated as appropriate: Allergies, current medications, and problem list.    Review of Systems:  Comprehensive  review of systems was negative except those addressed in the HPI,     History   Social History  . Marital Status: Widowed    Spouse Name: N/A    Number of Children: 3  . Years of Education: N/A   Occupational History  . Retired Estate manager/land agent - primary    Social History Main Topics  . Smoking status: Former Games developer  . Smokeless tobacco: Never Used  . Alcohol Use: Yes     Rarely  . Drug Use: No  . Sexually Active: Not on file   Other Topics Concern  . Not on file   Social History Narrative   Lives at [place] [facility] [place]. Born in [place]. Travels frequently.1 daughter, 2 step childrenRegular Exercise -  NODaily Caffeine Use:  1 coffee    Objective:  BP 124/84  Pulse 130  Temp 98.6 F (37 C) (Oral)  Ht 5\' 2"  (1.575 m)  Wt 145 lb 8 oz (65.998 kg)  BMI 26.61 kg/m2  SpO2 96%  General appearance: in moderate distress secondary to pain, alert, cooperative  and appears stated age Ears: normal TM's and external ear canals both ears Throat: lips, mucosa, and tongue normal; teeth and gums normal Neck: no adenopathy, no carotid bruit, supple, symmetrical, trachea midline and thyroid not enlarged, symmetric, no tenderness/mass/nodules Back: symmetric, no curvature. ROM normal. No CVA tenderness. Lungs: clear to auscultation bilaterally Heart: regular rate and rhythm, S1, S2 normal, no murmur, click, rub or gallop Abdomen: distended, tender diffusely,  Decreased bowel sounds  Pullses: 2+ and symmetric Skin: Skin color, texture, turgor normal. No rashes or lesions Lymph nodes: Cervical, supraclavicular, and axillary nodes normal.  Assessment and Plan:  Pancreatitis, acute With concurrent enteritis.  Patient should not have been sent home from ER on Saturday.  Have spoken with Dr. Monte Fantasia war, hospitalist on call who has arranged for immediate ER  evaluation followed by admission for iv antibiotics IV fluids resuscitation and bowel rest.    Updated Medication List Outpatient Encounter Prescriptions as of 12/01/2011  Medication Sig Dispense Refill  . Calcium Carbonate-Vitamin D (CALCIUM-D) 600-400 MG-UNIT TABS Take 1 tablet by mouth daily.      . cholecalciferol (VITAMIN D) 1000 UNITS tablet Take 1,000 Units by mouth daily.      . cyanocobalamin (,VITAMIN B-12,) 1000 MCG/ML injection Inject 1 mL (1,000 mcg total) into the muscle every 30 (thirty) days.  10 mL  1  . HYDROcodone-acetaminophen (NORCO) 5-325 MG per tablet Take 1 tablet by mouth every 6 (six) hours as needed.      . metoprolol succinate (TOPROL-XL) 25 MG 24 hr tablet Take 25 mg by mouth daily.      . metoprolol tartrate (LOPRESSOR) 25 MG tablet Take 25 mg by mouth daily as needed. Tachycardia      . pantoprazole (PROTONIX) 20 MG tablet Take 20 mg by mouth daily.      . potassium chloride (MICRO-K) 10 MEQ CR capsule Take 10 mEq by mouth 2 (two) times daily.      Marland Kitchen triamterene-hydrochlorothiazide (MAXZIDE-25) 37.5-25 MG per tablet Take 1 tablet by mouth daily.      Marland Kitchen warfarin (COUMADIN) 2 MG tablet Take 2 mg by mouth daily. 2 mg 4 days a week and 1 mg 3 days a week         No orders of the defined types were placed in this encounter.    No Follow-up on file.

## 2011-12-02 ENCOUNTER — Inpatient Hospital Stay: Payer: Self-pay | Admitting: Specialist

## 2011-12-02 DIAGNOSIS — Z452 Encounter for adjustment and management of vascular access device: Secondary | ICD-10-CM | POA: Diagnosis not present

## 2011-12-02 DIAGNOSIS — R918 Other nonspecific abnormal finding of lung field: Secondary | ICD-10-CM | POA: Diagnosis not present

## 2011-12-02 DIAGNOSIS — I2510 Atherosclerotic heart disease of native coronary artery without angina pectoris: Secondary | ICD-10-CM | POA: Diagnosis present

## 2011-12-02 DIAGNOSIS — K5289 Other specified noninfective gastroenteritis and colitis: Secondary | ICD-10-CM | POA: Diagnosis not present

## 2011-12-02 DIAGNOSIS — K219 Gastro-esophageal reflux disease without esophagitis: Secondary | ICD-10-CM | POA: Diagnosis not present

## 2011-12-02 DIAGNOSIS — G4733 Obstructive sleep apnea (adult) (pediatric): Secondary | ICD-10-CM | POA: Diagnosis present

## 2011-12-02 DIAGNOSIS — A09 Infectious gastroenteritis and colitis, unspecified: Secondary | ICD-10-CM | POA: Diagnosis not present

## 2011-12-02 DIAGNOSIS — Z9861 Coronary angioplasty status: Secondary | ICD-10-CM | POA: Diagnosis not present

## 2011-12-02 DIAGNOSIS — Z79899 Other long term (current) drug therapy: Secondary | ICD-10-CM | POA: Diagnosis not present

## 2011-12-02 DIAGNOSIS — Z888 Allergy status to other drugs, medicaments and biological substances status: Secondary | ICD-10-CM | POA: Diagnosis not present

## 2011-12-02 DIAGNOSIS — Z87891 Personal history of nicotine dependence: Secondary | ICD-10-CM | POA: Diagnosis not present

## 2011-12-02 DIAGNOSIS — Z90710 Acquired absence of both cervix and uterus: Secondary | ICD-10-CM | POA: Diagnosis not present

## 2011-12-02 DIAGNOSIS — H409 Unspecified glaucoma: Secondary | ICD-10-CM | POA: Diagnosis present

## 2011-12-02 DIAGNOSIS — K559 Vascular disorder of intestine, unspecified: Secondary | ICD-10-CM | POA: Diagnosis not present

## 2011-12-02 DIAGNOSIS — R062 Wheezing: Secondary | ICD-10-CM | POA: Diagnosis not present

## 2011-12-02 DIAGNOSIS — Z808 Family history of malignant neoplasm of other organs or systems: Secondary | ICD-10-CM | POA: Diagnosis not present

## 2011-12-02 DIAGNOSIS — I10 Essential (primary) hypertension: Secondary | ICD-10-CM | POA: Diagnosis not present

## 2011-12-02 DIAGNOSIS — R1083 Colic: Secondary | ICD-10-CM | POA: Diagnosis not present

## 2011-12-02 DIAGNOSIS — R109 Unspecified abdominal pain: Secondary | ICD-10-CM | POA: Diagnosis not present

## 2011-12-02 DIAGNOSIS — Z950 Presence of cardiac pacemaker: Secondary | ICD-10-CM | POA: Diagnosis not present

## 2011-12-02 DIAGNOSIS — Z8249 Family history of ischemic heart disease and other diseases of the circulatory system: Secondary | ICD-10-CM | POA: Diagnosis not present

## 2011-12-02 DIAGNOSIS — E876 Hypokalemia: Secondary | ICD-10-CM | POA: Diagnosis present

## 2011-12-02 DIAGNOSIS — Z7901 Long term (current) use of anticoagulants: Secondary | ICD-10-CM | POA: Diagnosis not present

## 2011-12-02 DIAGNOSIS — I4891 Unspecified atrial fibrillation: Secondary | ICD-10-CM | POA: Diagnosis not present

## 2011-12-02 DIAGNOSIS — K529 Noninfective gastroenteritis and colitis, unspecified: Secondary | ICD-10-CM | POA: Diagnosis not present

## 2011-12-02 DIAGNOSIS — I059 Rheumatic mitral valve disease, unspecified: Secondary | ICD-10-CM | POA: Diagnosis not present

## 2011-12-02 DIAGNOSIS — R1031 Right lower quadrant pain: Secondary | ICD-10-CM | POA: Diagnosis not present

## 2011-12-02 DIAGNOSIS — E785 Hyperlipidemia, unspecified: Secondary | ICD-10-CM | POA: Diagnosis present

## 2011-12-02 DIAGNOSIS — Z881 Allergy status to other antibiotic agents status: Secondary | ICD-10-CM | POA: Diagnosis not present

## 2011-12-02 DIAGNOSIS — N6019 Diffuse cystic mastopathy of unspecified breast: Secondary | ICD-10-CM | POA: Diagnosis present

## 2011-12-02 DIAGNOSIS — K589 Irritable bowel syndrome without diarrhea: Secondary | ICD-10-CM | POA: Diagnosis present

## 2011-12-02 DIAGNOSIS — R110 Nausea: Secondary | ICD-10-CM | POA: Diagnosis not present

## 2011-12-02 DIAGNOSIS — R1032 Left lower quadrant pain: Secondary | ICD-10-CM | POA: Diagnosis not present

## 2011-12-02 DIAGNOSIS — Z833 Family history of diabetes mellitus: Secondary | ICD-10-CM | POA: Diagnosis not present

## 2011-12-02 DIAGNOSIS — K5090 Crohn's disease, unspecified, without complications: Secondary | ICD-10-CM | POA: Diagnosis not present

## 2011-12-02 DIAGNOSIS — Z8542 Personal history of malignant neoplasm of other parts of uterus: Secondary | ICD-10-CM | POA: Diagnosis not present

## 2011-12-02 DIAGNOSIS — R0602 Shortness of breath: Secondary | ICD-10-CM | POA: Diagnosis not present

## 2011-12-02 LAB — CBC WITH DIFFERENTIAL/PLATELET
Basophil #: 0 10*3/uL (ref 0.0–0.1)
Basophil %: 0.3 %
Eosinophil #: 0 10*3/uL (ref 0.0–0.7)
Eosinophil %: 0.1 %
HCT: 36.6 % (ref 35.0–47.0)
HGB: 11.8 g/dL — ABNORMAL LOW (ref 12.0–16.0)
Lymphocyte #: 1.9 10*3/uL (ref 1.0–3.6)
Lymphocyte %: 12.9 %
MCH: 30.1 pg (ref 26.0–34.0)
MCHC: 32.2 g/dL (ref 32.0–36.0)
MCV: 93 fL (ref 80–100)
Monocyte #: 1.4 x10 3/mm — ABNORMAL HIGH (ref 0.2–0.9)
Monocyte %: 9.3 %
Neutrophil #: 11.6 10*3/uL — ABNORMAL HIGH (ref 1.4–6.5)
Neutrophil %: 77.4 %
Platelet: 175 10*3/uL (ref 150–440)
RBC: 3.93 10*6/uL (ref 3.80–5.20)
RDW: 13.1 % (ref 11.5–14.5)
WBC: 15 10*3/uL — ABNORMAL HIGH (ref 3.6–11.0)

## 2011-12-02 LAB — BASIC METABOLIC PANEL
Anion Gap: 8 (ref 7–16)
BUN: 11 mg/dL (ref 7–18)
Calcium, Total: 7.4 mg/dL — ABNORMAL LOW (ref 8.5–10.1)
Chloride: 103 mmol/L (ref 98–107)
Co2: 26 mmol/L (ref 21–32)
Creatinine: 0.95 mg/dL (ref 0.60–1.30)
EGFR (African American): >60
EGFR (Non-African Amer.): 54 — ABNORMAL LOW
Glucose: 172 mg/dL — ABNORMAL HIGH (ref 65–99)
Osmolality: 277 (ref 275–301)
Potassium: 3.7 mmol/L (ref 3.5–5.1)
Sodium: 137 mmol/L (ref 136–145)

## 2011-12-03 ENCOUNTER — Ambulatory Visit: Payer: Medicare Other | Admitting: Internal Medicine

## 2011-12-03 LAB — CBC WITH DIFFERENTIAL/PLATELET
Basophil #: 0 10*3/uL (ref 0.0–0.1)
Basophil %: 0.1 %
Eosinophil #: 0 10*3/uL (ref 0.0–0.7)
Eosinophil %: 0.2 %
HCT: 32.6 % — ABNORMAL LOW (ref 35.0–47.0)
HGB: 10.5 g/dL — ABNORMAL LOW (ref 12.0–16.0)
Lymphocyte #: 1.1 10*3/uL (ref 1.0–3.6)
Lymphocyte %: 12.7 %
MCH: 30.4 pg (ref 26.0–34.0)
MCHC: 32.2 g/dL (ref 32.0–36.0)
MCV: 94 fL (ref 80–100)
Monocyte #: 0.9 x10 3/mm (ref 0.2–0.9)
Monocyte %: 9.9 %
Neutrophil #: 6.9 10*3/uL — ABNORMAL HIGH (ref 1.4–6.5)
Neutrophil %: 77.1 %
Platelet: 180 10*3/uL (ref 150–440)
RBC: 3.45 10*6/uL — ABNORMAL LOW (ref 3.80–5.20)
RDW: 13.3 % (ref 11.5–14.5)
WBC: 9 10*3/uL (ref 3.6–11.0)

## 2011-12-03 LAB — PROTIME-INR
INR: 1.2
Prothrombin Time: 15.7 secs — ABNORMAL HIGH (ref 11.5–14.7)

## 2011-12-04 LAB — PROTIME-INR
INR: 2.2
Prothrombin Time: 25 secs — ABNORMAL HIGH (ref 11.5–14.7)

## 2011-12-04 LAB — OCCULT BLOOD X 1 CARD TO LAB, STOOL: Occult Blood, Feces: NEGATIVE

## 2011-12-04 LAB — CLOSTRIDIUM DIFFICILE BY PCR

## 2011-12-05 LAB — PROTIME-INR
INR: 3.2
Prothrombin Time: 32.4 secs — ABNORMAL HIGH (ref 11.5–14.7)

## 2011-12-06 LAB — PROTIME-INR
INR: 2.8
Prothrombin Time: 29.6 secs — ABNORMAL HIGH (ref 11.5–14.7)

## 2011-12-06 LAB — STOOL CULTURE

## 2011-12-07 LAB — BASIC METABOLIC PANEL
Anion Gap: 9 (ref 7–16)
BUN: 6 mg/dL — ABNORMAL LOW (ref 7–18)
Calcium, Total: 8.1 mg/dL — ABNORMAL LOW (ref 8.5–10.1)
Chloride: 104 mmol/L (ref 98–107)
Co2: 30 mmol/L (ref 21–32)
Creatinine: 0.72 mg/dL (ref 0.60–1.30)
EGFR (African American): >60
EGFR (Non-African Amer.): >60
Glucose: 122 mg/dL — ABNORMAL HIGH (ref 65–99)
Osmolality: 284 (ref 275–301)
Potassium: 3.6 mmol/L (ref 3.5–5.1)
Sodium: 143 mmol/L (ref 136–145)

## 2011-12-07 LAB — PROTIME-INR
INR: 2.7
Prothrombin Time: 29.1 secs — ABNORMAL HIGH (ref 11.5–14.7)

## 2011-12-07 LAB — HEMOGLOBIN: HGB: 11.4 g/dL — ABNORMAL LOW (ref 12.0–16.0)

## 2011-12-08 LAB — PROTIME-INR
INR: 3
Prothrombin Time: 30.9 secs — ABNORMAL HIGH (ref 11.5–14.7)

## 2011-12-09 ENCOUNTER — Telehealth: Payer: Self-pay | Admitting: Internal Medicine

## 2011-12-09 DIAGNOSIS — I10 Essential (primary) hypertension: Secondary | ICD-10-CM | POA: Diagnosis not present

## 2011-12-09 DIAGNOSIS — K219 Gastro-esophageal reflux disease without esophagitis: Secondary | ICD-10-CM | POA: Diagnosis not present

## 2011-12-09 DIAGNOSIS — K5289 Other specified noninfective gastroenteritis and colitis: Secondary | ICD-10-CM | POA: Diagnosis not present

## 2011-12-09 DIAGNOSIS — K589 Irritable bowel syndrome without diarrhea: Secondary | ICD-10-CM | POA: Diagnosis not present

## 2011-12-09 DIAGNOSIS — I4891 Unspecified atrial fibrillation: Secondary | ICD-10-CM | POA: Diagnosis not present

## 2011-12-09 NOTE — Telephone Encounter (Signed)
[facility] hospital follow up discharged 7/22 appoiintment 12/22/11

## 2011-12-10 ENCOUNTER — Telehealth: Payer: Self-pay | Admitting: Internal Medicine

## 2011-12-10 DIAGNOSIS — G4700 Insomnia, unspecified: Secondary | ICD-10-CM

## 2011-12-10 DIAGNOSIS — N760 Acute vaginitis: Secondary | ICD-10-CM

## 2011-12-10 MED ORDER — FLUCONAZOLE 150 MG PO TABS: 150.0000 mg | ORAL_TABLET | Freq: Every day | ORAL | Status: AC

## 2011-12-10 MED ORDER — ZOLPIDEM TARTRATE 5 MG PO TABS: 5.0000 mg | ORAL_TABLET | Freq: Every evening | ORAL | Status: DC | PRN

## 2011-12-10 NOTE — Telephone Encounter (Signed)
Yes both are now in epic,  The fluconzole was sent.  The zolpidem 5 mg must be phoned in.

## 2011-12-10 NOTE — Telephone Encounter (Signed)
Spoke with patient and she states that she normally get a yeast infection when she is on antibiotic, she is asking for a rx for diflucan, also she states that when she was being discharged from the hospital they told her that they would calling in a rx for ambien, but this was never done. Patient is asking if you would write rx.

## 2011-12-10 NOTE — Telephone Encounter (Signed)
Kara [name] called about Kara [name] Pt is on cypro and started to burn when urinating.  Kara [name] stated she thinks she need diflutan     Also complaining of not being able to sleep and she also told them at hospital they were going to call in rx for ambeim but didn't can you call this in.   [facility]

## 2011-12-11 DIAGNOSIS — I10 Essential (primary) hypertension: Secondary | ICD-10-CM | POA: Diagnosis not present

## 2011-12-11 DIAGNOSIS — K589 Irritable bowel syndrome without diarrhea: Secondary | ICD-10-CM | POA: Diagnosis not present

## 2011-12-11 DIAGNOSIS — K219 Gastro-esophageal reflux disease without esophagitis: Secondary | ICD-10-CM | POA: Diagnosis not present

## 2011-12-11 DIAGNOSIS — K5289 Other specified noninfective gastroenteritis and colitis: Secondary | ICD-10-CM | POA: Diagnosis not present

## 2011-12-11 DIAGNOSIS — I4891 Unspecified atrial fibrillation: Secondary | ICD-10-CM | POA: Diagnosis not present

## 2011-12-11 NOTE — Telephone Encounter (Signed)
Left message notifying patient and also left message notifying Velna Hatchet. Rx for [place] sent to pharmacy.

## 2011-12-11 NOTE — Telephone Encounter (Signed)
Requested ED discharge summary from [facility].

## 2011-12-12 DIAGNOSIS — K5289 Other specified noninfective gastroenteritis and colitis: Secondary | ICD-10-CM | POA: Diagnosis not present

## 2011-12-12 DIAGNOSIS — I4891 Unspecified atrial fibrillation: Secondary | ICD-10-CM | POA: Diagnosis not present

## 2011-12-12 DIAGNOSIS — K219 Gastro-esophageal reflux disease without esophagitis: Secondary | ICD-10-CM | POA: Diagnosis not present

## 2011-12-12 DIAGNOSIS — I10 Essential (primary) hypertension: Secondary | ICD-10-CM | POA: Diagnosis not present

## 2011-12-12 DIAGNOSIS — K589 Irritable bowel syndrome without diarrhea: Secondary | ICD-10-CM | POA: Diagnosis not present

## 2011-12-12 NOTE — Telephone Encounter (Signed)
Spoke with patient via telephone, she stated that she is doing better.  She will see Dr. Dan Humphreys at her appt on 12/22/2011.  Discharge summary left for Dr. Dan Humphreys to review.

## 2011-12-12 NOTE — Telephone Encounter (Signed)
Discharge summary from [facility] is in your red folder.

## 2011-12-15 DIAGNOSIS — K589 Irritable bowel syndrome without diarrhea: Secondary | ICD-10-CM | POA: Diagnosis not present

## 2011-12-15 DIAGNOSIS — I4891 Unspecified atrial fibrillation: Secondary | ICD-10-CM | POA: Diagnosis not present

## 2011-12-15 DIAGNOSIS — I10 Essential (primary) hypertension: Secondary | ICD-10-CM | POA: Diagnosis not present

## 2011-12-15 DIAGNOSIS — K219 Gastro-esophageal reflux disease without esophagitis: Secondary | ICD-10-CM | POA: Diagnosis not present

## 2011-12-15 DIAGNOSIS — K5289 Other specified noninfective gastroenteritis and colitis: Secondary | ICD-10-CM | POA: Diagnosis not present

## 2011-12-16 DIAGNOSIS — I4891 Unspecified atrial fibrillation: Secondary | ICD-10-CM | POA: Diagnosis not present

## 2011-12-16 DIAGNOSIS — I10 Essential (primary) hypertension: Secondary | ICD-10-CM | POA: Diagnosis not present

## 2011-12-16 DIAGNOSIS — K5289 Other specified noninfective gastroenteritis and colitis: Secondary | ICD-10-CM | POA: Diagnosis not present

## 2011-12-16 DIAGNOSIS — K219 Gastro-esophageal reflux disease without esophagitis: Secondary | ICD-10-CM | POA: Diagnosis not present

## 2011-12-16 DIAGNOSIS — K589 Irritable bowel syndrome without diarrhea: Secondary | ICD-10-CM | POA: Diagnosis not present

## 2011-12-17 DIAGNOSIS — Z86010 Personal history of colonic polyps: Secondary | ICD-10-CM | POA: Diagnosis not present

## 2011-12-17 DIAGNOSIS — K219 Gastro-esophageal reflux disease without esophagitis: Secondary | ICD-10-CM | POA: Diagnosis not present

## 2011-12-17 DIAGNOSIS — K5289 Other specified noninfective gastroenteritis and colitis: Secondary | ICD-10-CM | POA: Diagnosis not present

## 2011-12-17 DIAGNOSIS — R109 Unspecified abdominal pain: Secondary | ICD-10-CM | POA: Diagnosis not present

## 2011-12-18 DIAGNOSIS — K219 Gastro-esophageal reflux disease without esophagitis: Secondary | ICD-10-CM | POA: Diagnosis not present

## 2011-12-18 DIAGNOSIS — K589 Irritable bowel syndrome without diarrhea: Secondary | ICD-10-CM | POA: Diagnosis not present

## 2011-12-18 DIAGNOSIS — I4891 Unspecified atrial fibrillation: Secondary | ICD-10-CM | POA: Diagnosis not present

## 2011-12-18 DIAGNOSIS — K5289 Other specified noninfective gastroenteritis and colitis: Secondary | ICD-10-CM | POA: Diagnosis not present

## 2011-12-18 DIAGNOSIS — I10 Essential (primary) hypertension: Secondary | ICD-10-CM | POA: Diagnosis not present

## 2011-12-19 DIAGNOSIS — K5289 Other specified noninfective gastroenteritis and colitis: Secondary | ICD-10-CM

## 2011-12-19 DIAGNOSIS — K219 Gastro-esophageal reflux disease without esophagitis: Secondary | ICD-10-CM

## 2011-12-19 DIAGNOSIS — I4891 Unspecified atrial fibrillation: Secondary | ICD-10-CM

## 2011-12-19 DIAGNOSIS — I10 Essential (primary) hypertension: Secondary | ICD-10-CM | POA: Diagnosis not present

## 2011-12-19 DIAGNOSIS — K589 Irritable bowel syndrome without diarrhea: Secondary | ICD-10-CM

## 2011-12-22 ENCOUNTER — Encounter: Payer: Self-pay | Admitting: Internal Medicine

## 2011-12-22 ENCOUNTER — Telehealth: Payer: Self-pay | Admitting: Internal Medicine

## 2011-12-22 ENCOUNTER — Ambulatory Visit (INDEPENDENT_AMBULATORY_CARE_PROVIDER_SITE_OTHER): Payer: Medicare Other | Admitting: Internal Medicine

## 2011-12-22 VITALS — BP 130/70 | HR 68 | Temp 98.6°F | Ht 62.0 in | Wt 135.5 lb

## 2011-12-22 DIAGNOSIS — K859 Acute pancreatitis without necrosis or infection, unspecified: Secondary | ICD-10-CM | POA: Diagnosis not present

## 2011-12-22 DIAGNOSIS — R197 Diarrhea, unspecified: Secondary | ICD-10-CM

## 2011-12-22 DIAGNOSIS — B373 Candidiasis of vulva and vagina: Secondary | ICD-10-CM | POA: Diagnosis not present

## 2011-12-22 DIAGNOSIS — K529 Noninfective gastroenteritis and colitis, unspecified: Secondary | ICD-10-CM

## 2011-12-22 DIAGNOSIS — Z7901 Long term (current) use of anticoagulants: Secondary | ICD-10-CM | POA: Diagnosis not present

## 2011-12-22 DIAGNOSIS — G4700 Insomnia, unspecified: Secondary | ICD-10-CM | POA: Diagnosis not present

## 2011-12-22 DIAGNOSIS — I4891 Unspecified atrial fibrillation: Secondary | ICD-10-CM

## 2011-12-22 LAB — COMPREHENSIVE METABOLIC PANEL
ALT: 24 U/L (ref 0–35)
AST: 27 U/L (ref 0–37)
Albumin: 3.3 g/dL — ABNORMAL LOW (ref 3.5–5.2)
Alkaline Phosphatase: 51 U/L (ref 39–117)
BUN: 17 mg/dL (ref 6–23)
CO2: 25 mEq/L (ref 19–32)
Calcium: 9.2 mg/dL (ref 8.4–10.5)
Chloride: 100 mEq/L (ref 96–112)
Creatinine, Ser: 0.8 mg/dL (ref 0.4–1.2)
GFR: 69.08 mL/min (ref >60.00)
Glucose, Bld: 163 mg/dL — ABNORMAL HIGH (ref 70–99)
Potassium: 3.5 mEq/L (ref 3.5–5.1)
Sodium: 135 mEq/L (ref 135–145)
Total Bilirubin: 0.8 mg/dL (ref 0.3–1.2)
Total Protein: 6.7 g/dL (ref 6.0–8.3)

## 2011-12-22 LAB — CBC WITH DIFFERENTIAL/PLATELET
Basophils Absolute: 0.1 10*3/uL (ref 0.0–0.1)
Basophils Relative: 0.6 % (ref 0.0–3.0)
Eosinophils Absolute: 0 10*3/uL (ref 0.0–0.7)
Eosinophils Relative: 0.4 % (ref 0.0–5.0)
HCT: 42.9 % (ref 36.0–46.0)
Hemoglobin: 14.3 g/dL (ref 12.0–15.0)
Lymphocytes Relative: 37.2 % (ref 12.0–46.0)
Lymphs Abs: 3.2 10*3/uL (ref 0.7–4.0)
MCHC: 33.5 g/dL (ref 30.0–36.0)
MCV: 90.9 fl (ref 78.0–100.0)
Monocytes Absolute: 0.5 10*3/uL (ref 0.1–1.0)
Monocytes Relative: 5.7 % (ref 3.0–12.0)
Neutro Abs: 4.9 10*3/uL (ref 1.4–7.7)
Neutrophils Relative %: 56.1 % (ref 43.0–77.0)
Platelets: 281 10*3/uL (ref 150.0–400.0)
RBC: 4.71 Mil/uL (ref 3.87–5.11)
RDW: 13.4 % (ref 11.5–14.6)
WBC: 8.7 10*3/uL (ref 4.5–10.5)

## 2011-12-22 LAB — LIPASE: Lipase: 42 U/L (ref 11.0–59.0)

## 2011-12-22 LAB — PROTIME-INR
INR: 3.7 ratio — ABNORMAL HIGH (ref 0.8–1.0)
Prothrombin Time: 40.8 s — ABNORMAL HIGH (ref 10.2–12.4)

## 2011-12-22 MED ORDER — NYSTATIN-TRIAMCINOLONE 100000-0.1 UNIT/GM-% EX OINT: TOPICAL_OINTMENT | Freq: Two times a day (BID) | CUTANEOUS | Status: AC

## 2011-12-22 MED ORDER — HYDROCODONE-ACETAMINOPHEN 5-325 MG PO TABS: 1.0000 | ORAL_TABLET | Freq: Four times a day (QID) | ORAL | Status: DC | PRN

## 2011-12-22 MED ORDER — ZOLPIDEM TARTRATE 5 MG PO TABS: 5.0000 mg | ORAL_TABLET | Freq: Every evening | ORAL | Status: DC | PRN

## 2011-12-22 NOTE — Assessment & Plan Note (Signed)
Symptoms unchanged from baseline. However, improved since recent episode of colitis. Colonoscopy scheduled for next week.

## 2011-12-22 NOTE — Assessment & Plan Note (Signed)
Appears to be in normal sinus rhythm. Rate controlled with metoprolol. On Coumadin for chronic anticoagulation. Will check INR today.

## 2011-12-22 NOTE — Telephone Encounter (Signed)
I have deactivated my chart for the patient.

## 2011-12-22 NOTE — Progress Notes (Signed)
Subjective:    Patient ID: Kara [name], female    DOB: Nov 18, 1922, 78 y.o.   MRN: [id number]  HPI 78 year old female presents for followup after recent hospitalization for pancreatitis and enterocolitis. Kara [name] reports that her energy level is gradually improving. Her abdominal pain is also improving. Kara [name] continues to have some mild right upper quadrant abdominal pain. Kara [name] continues to have occasional loose stools, typically 4 per day. Kara [name] denies any blood in her stool. Kara [name] denies any fever or chills. Kara [name] denies any nausea or vomiting. Kara [name] has resumed some solid foods. Kara [name] reports that Kara [name] is tolerating these well. Kara [name] continues to have some intermittent fatigue.  Regards to her age of fibrillation and chronic anticoagulation, Kara [name] reports Kara [name] started back on Coumadin last week. Kara [name] denies any palpitations, chest pain, shortness of breath.  Outpatient Encounter Prescriptions as of 12/22/2011  Medication Sig Dispense Refill  . Calcium Carbonate-Vitamin D (CALCIUM-D) 600-400 MG-UNIT TABS Take 1 tablet by mouth daily.      . cholecalciferol (VITAMIN D) 1000 UNITS tablet Take 1,000 Units by mouth daily.      . cyanocobalamin (,VITAMIN B-12,) 1000 MCG/ML injection Inject 1 mL (1,000 mcg total) into the muscle every 30 (thirty) days.  10 mL  1  . HYDROcodone-acetaminophen (NORCO/VICODIN) 5-325 MG per tablet Take 1 tablet by mouth every 6 (six) hours as needed.  30 tablet  0  . metoprolol succinate (TOPROL-XL) 25 MG 24 hr tablet Take 25 mg by mouth daily.      . metoprolol tartrate (LOPRESSOR) 25 MG tablet Take 25 mg by mouth daily as needed. Tachycardia      . pantoprazole (PROTONIX) 20 MG tablet Take 20 mg by mouth daily.      . potassium chloride (MICRO-K) 10 MEQ CR capsule Take 10 mEq by mouth 2 (two) times daily.      Marland Kitchen triamterene-hydrochlorothiazide (MAXZIDE-25) 37.5-25 MG per tablet Take 1 tablet by mouth daily.      Marland Kitchen warfarin (COUMADIN) 2 MG tablet Take 2 mg by mouth daily. 2 mg 4 days a week and 1  mg 3 days a week      . zolpidem (AMBIEN) 5 MG tablet Take 1 tablet (5 mg total) by mouth at bedtime as needed for sleep.  30 tablet  0  . DISCONTD: HYDROcodone-acetaminophen (NORCO) 5-325 MG per tablet Take 1 tablet by mouth every 6 (six) hours as needed.      Marland Kitchen DISCONTD: zolpidem (AMBIEN) 5 MG tablet Take 1 tablet (5 mg total) by mouth at bedtime as needed for sleep.  30 tablet  0  . nystatin-triamcinolone ointment (MYCOLOG) Apply topically 2 (two) times daily.  30 g  0   BP 130/70  Pulse 68  Temp 98.6 F (37 C) (Oral)  Ht 5\' 2"  (1.575 m)  Wt 135 lb 8 oz (61.462 kg)  BMI 24.78 kg/m2  SpO2 95%  Review of Systems  Constitutional: Positive for fatigue. Negative for fever, chills, appetite change and unexpected weight change.  HENT: Negative for ear pain, congestion, sore throat, trouble swallowing, neck pain, voice change and sinus pressure.   Eyes: Negative for visual disturbance.  Respiratory: Negative for cough, shortness of breath, wheezing and stridor.   Cardiovascular: Negative for chest pain, palpitations and leg swelling.  Gastrointestinal: Positive for abdominal pain and diarrhea. Negative for nausea, vomiting, constipation, blood in stool, abdominal distention and anal bleeding.  Genitourinary: Negative for dysuria and flank pain.  Musculoskeletal: Negative for myalgias, arthralgias and gait problem.  Skin: Negative for color change and rash.  Neurological: Negative for dizziness and headaches.  Hematological: Negative for adenopathy. Does not bruise/bleed easily.  Psychiatric/Behavioral: Negative for suicidal ideas, disturbed wake/sleep cycle and dysphoric mood. The patient is not nervous/anxious.        Objective:   Physical Exam  Constitutional: Kara [name] is oriented to person, place, and time. Kara [name] appears well-developed and well-nourished. No distress.  HENT:  Head: Normocephalic and atraumatic.  Right Ear: External ear normal.  Left Ear: External ear normal.  Nose: Nose  normal.  Mouth/Throat: Oropharynx is clear and moist. No oropharyngeal exudate.  Eyes: Conjunctivae are normal. Pupils are equal, round, and reactive to light. Right eye exhibits no discharge. Left eye exhibits no discharge. No scleral icterus.  Neck: Normal range of motion. Neck supple. No tracheal deviation present. No thyromegaly present.  Cardiovascular: Normal rate, regular rhythm, normal heart sounds and intact distal pulses.  Exam reveals no gallop and no friction rub.   No murmur heard. Pulmonary/Chest: Effort normal and breath sounds normal. No respiratory distress. Kara [name] has no wheezes. Kara [name] has no rales. Kara [name] exhibits no tenderness.  Abdominal: Soft. Bowel sounds are normal. There is tenderness (RUQ).  Musculoskeletal: Normal range of motion. Kara [name] exhibits no edema and no tenderness.  Lymphadenopathy:    Kara [name] has no cervical adenopathy.  Neurological: Kara [name] is alert and oriented to person, place, and time. No cranial nerve deficit. Kara [name] exhibits normal muscle tone. Coordination normal.  Skin: Skin is warm and dry. No rash noted. Kara [name] is not diaphoretic. No erythema. No pallor.  Psychiatric: Kara [name] has a normal mood and affect. Her behavior is normal. Judgment and thought content normal.          Assessment & Plan:

## 2011-12-22 NOTE — Assessment & Plan Note (Signed)
S/p recent admission for acute pancreatitis and enterocolitis.  Symptoms improving. Will recheck CMP and lipase with labs today. Encouraged pt to advance diet gradually as tolerated. Pt also has follow up with GI for colonoscopy next week. Follow up here in 4 weeks.

## 2011-12-22 NOTE — Telephone Encounter (Signed)
Pt wanted to cancel her mychart.  Pt stated it was to confusing

## 2011-12-22 NOTE — Assessment & Plan Note (Signed)
Goal INR 2-3. Check INR with labs today.

## 2011-12-25 DIAGNOSIS — I10 Essential (primary) hypertension: Secondary | ICD-10-CM | POA: Diagnosis not present

## 2011-12-25 DIAGNOSIS — K5289 Other specified noninfective gastroenteritis and colitis: Secondary | ICD-10-CM | POA: Diagnosis not present

## 2011-12-25 DIAGNOSIS — I4891 Unspecified atrial fibrillation: Secondary | ICD-10-CM | POA: Diagnosis not present

## 2011-12-25 DIAGNOSIS — K219 Gastro-esophageal reflux disease without esophagitis: Secondary | ICD-10-CM | POA: Diagnosis not present

## 2011-12-25 DIAGNOSIS — K589 Irritable bowel syndrome without diarrhea: Secondary | ICD-10-CM | POA: Diagnosis not present

## 2011-12-26 ENCOUNTER — Telehealth: Payer: Self-pay | Admitting: *Deleted

## 2011-12-26 DIAGNOSIS — K589 Irritable bowel syndrome without diarrhea: Secondary | ICD-10-CM | POA: Diagnosis not present

## 2011-12-26 DIAGNOSIS — K5289 Other specified noninfective gastroenteritis and colitis: Secondary | ICD-10-CM | POA: Diagnosis not present

## 2011-12-26 DIAGNOSIS — K219 Gastro-esophageal reflux disease without esophagitis: Secondary | ICD-10-CM | POA: Diagnosis not present

## 2011-12-26 DIAGNOSIS — R109 Unspecified abdominal pain: Secondary | ICD-10-CM | POA: Diagnosis not present

## 2011-12-26 DIAGNOSIS — R112 Nausea with vomiting, unspecified: Secondary | ICD-10-CM | POA: Diagnosis not present

## 2011-12-26 DIAGNOSIS — I4891 Unspecified atrial fibrillation: Secondary | ICD-10-CM | POA: Diagnosis not present

## 2011-12-26 DIAGNOSIS — I10 Essential (primary) hypertension: Secondary | ICD-10-CM | POA: Diagnosis not present

## 2011-12-26 LAB — COMPREHENSIVE METABOLIC PANEL
Albumin: 3.5 g/dL (ref 3.4–5.0)
Alkaline Phosphatase: 70 U/L (ref 50–136)
Anion Gap: 13 (ref 7–16)
BUN: 21 mg/dL — ABNORMAL HIGH (ref 7–18)
Bilirubin,Total: 1.4 mg/dL — ABNORMAL HIGH (ref 0.2–1.0)
Calcium, Total: 10.5 mg/dL — ABNORMAL HIGH (ref 8.5–10.1)
Chloride: 95 mmol/L — ABNORMAL LOW (ref 98–107)
Co2: 28 mmol/L (ref 21–32)
Creatinine: 1.09 mg/dL (ref 0.60–1.30)
EGFR (African American): 53 — ABNORMAL LOW
EGFR (Non-African Amer.): 46 — ABNORMAL LOW
Glucose: 196 mg/dL — ABNORMAL HIGH (ref 65–99)
Osmolality: 280 (ref 275–301)
Potassium: 3.5 mmol/L (ref 3.5–5.1)
SGOT(AST): 27 U/L (ref 15–37)
SGPT (ALT): 30 U/L (ref 12–78)
Sodium: 136 mmol/L (ref 136–145)
Total Protein: 7.6 g/dL (ref 6.4–8.2)

## 2011-12-26 LAB — URINALYSIS, COMPLETE
Bilirubin,UR: NEGATIVE
Blood: NEGATIVE
Glucose,UR: NEGATIVE mg/dL (ref 0–75)
Granular Cast: 4
Hyaline Cast: 9
Ketone: NEGATIVE
Nitrite: NEGATIVE
Ph: 5 (ref 4.5–8.0)
Protein: NEGATIVE
RBC,UR: 1 /HPF (ref 0–5)
Specific Gravity: 1.016 (ref 1.003–1.030)
Squamous Epithelial: 6
Transitional Epi: 1
WBC UR: 6 /HPF (ref 0–5)

## 2011-12-26 LAB — CBC WITH DIFFERENTIAL/PLATELET
Basophil #: 0 10*3/uL (ref 0.0–0.1)
Basophil %: 0.1 %
Eosinophil #: 0 10*3/uL (ref 0.0–0.7)
Eosinophil %: 0.2 %
HCT: 45 % (ref 35.0–47.0)
HGB: 15.1 g/dL (ref 12.0–16.0)
Lymphocyte #: 1.6 10*3/uL (ref 1.0–3.6)
Lymphocyte %: 9.9 %
MCH: 30 pg (ref 26.0–34.0)
MCHC: 33.7 g/dL (ref 32.0–36.0)
MCV: 89 fL (ref 80–100)
Monocyte #: 0.7 x10 3/mm (ref 0.2–0.9)
Monocyte %: 4.6 %
Neutrophil #: 13.7 10*3/uL — ABNORMAL HIGH (ref 1.4–6.5)
Neutrophil %: 85.2 %
Platelet: 271 10*3/uL (ref 150–440)
RBC: 5.04 10*6/uL (ref 3.80–5.20)
RDW: 13.4 % (ref 11.5–14.5)
WBC: 16.1 10*3/uL — ABNORMAL HIGH (ref 3.6–11.0)

## 2011-12-26 LAB — LIPASE, BLOOD: Lipase: 143 U/L (ref 73–393)

## 2011-12-26 NOTE — Telephone Encounter (Signed)
Patient advised via telephone

## 2011-12-26 NOTE — Telephone Encounter (Signed)
Patient called stating that she has vomited several times today, no diarrhea or nausea but she didn't keep the insure down.  She has no fever, the nurse checked her blood sugar and it was 160 and her other vital signs were normal.  Patient stated that she doesn't think she needs to be seen in Urgent Care or ED but wanted to get our advise.  I advised patient not to try any solid foods for a while and try clear fluids like ginger ale or 7up, gatoraid,crackers, and advised of the braty diet.  Advised patient that if her symptoms changed or worsened in any way that she would need to be seen before Monday.  Patient verbalized her understanding.

## 2011-12-26 NOTE — Telephone Encounter (Signed)
Agree with plan, however if patient's nausea persist and/or unable to keep liquids down, then she needs to be seen this weekend, as she recently had pancreatitis. (labs this week were normal though)

## 2011-12-27 ENCOUNTER — Inpatient Hospital Stay: Payer: Self-pay | Admitting: Surgery

## 2011-12-27 DIAGNOSIS — M6281 Muscle weakness (generalized): Secondary | ICD-10-CM | POA: Diagnosis not present

## 2011-12-27 DIAGNOSIS — N6019 Diffuse cystic mastopathy of unspecified breast: Secondary | ICD-10-CM | POA: Diagnosis present

## 2011-12-27 DIAGNOSIS — J90 Pleural effusion, not elsewhere classified: Secondary | ICD-10-CM | POA: Diagnosis not present

## 2011-12-27 DIAGNOSIS — R1115 Cyclical vomiting syndrome unrelated to migraine: Secondary | ICD-10-CM | POA: Diagnosis not present

## 2011-12-27 DIAGNOSIS — K5090 Crohn's disease, unspecified, without complications: Secondary | ICD-10-CM | POA: Diagnosis not present

## 2011-12-27 DIAGNOSIS — R0902 Hypoxemia: Secondary | ICD-10-CM | POA: Diagnosis not present

## 2011-12-27 DIAGNOSIS — Z90710 Acquired absence of both cervix and uterus: Secondary | ICD-10-CM | POA: Diagnosis not present

## 2011-12-27 DIAGNOSIS — Z9861 Coronary angioplasty status: Secondary | ICD-10-CM | POA: Diagnosis not present

## 2011-12-27 DIAGNOSIS — I10 Essential (primary) hypertension: Secondary | ICD-10-CM | POA: Diagnosis not present

## 2011-12-27 DIAGNOSIS — K520 Gastroenteritis and colitis due to radiation: Secondary | ICD-10-CM | POA: Diagnosis not present

## 2011-12-27 DIAGNOSIS — H409 Unspecified glaucoma: Secondary | ICD-10-CM | POA: Diagnosis present

## 2011-12-27 DIAGNOSIS — D72829 Elevated white blood cell count, unspecified: Secondary | ICD-10-CM | POA: Diagnosis not present

## 2011-12-27 DIAGNOSIS — T66XXXS Radiation sickness, unspecified, sequela: Secondary | ICD-10-CM | POA: Diagnosis not present

## 2011-12-27 DIAGNOSIS — I2510 Atherosclerotic heart disease of native coronary artery without angina pectoris: Secondary | ICD-10-CM | POA: Diagnosis present

## 2011-12-27 DIAGNOSIS — J811 Chronic pulmonary edema: Secondary | ICD-10-CM | POA: Diagnosis not present

## 2011-12-27 DIAGNOSIS — K219 Gastro-esophageal reflux disease without esophagitis: Secondary | ICD-10-CM | POA: Diagnosis present

## 2011-12-27 DIAGNOSIS — R112 Nausea with vomiting, unspecified: Secondary | ICD-10-CM | POA: Diagnosis not present

## 2011-12-27 DIAGNOSIS — I059 Rheumatic mitral valve disease, unspecified: Secondary | ICD-10-CM | POA: Diagnosis not present

## 2011-12-27 DIAGNOSIS — Z5189 Encounter for other specified aftercare: Secondary | ICD-10-CM | POA: Diagnosis not present

## 2011-12-27 DIAGNOSIS — Z48815 Encounter for surgical aftercare following surgery on the digestive system: Secondary | ICD-10-CM | POA: Diagnosis not present

## 2011-12-27 DIAGNOSIS — G4733 Obstructive sleep apnea (adult) (pediatric): Secondary | ICD-10-CM | POA: Diagnosis present

## 2011-12-27 DIAGNOSIS — E860 Dehydration: Secondary | ICD-10-CM | POA: Diagnosis present

## 2011-12-27 DIAGNOSIS — E41 Nutritional marasmus: Secondary | ICD-10-CM | POA: Diagnosis present

## 2011-12-27 DIAGNOSIS — Z881 Allergy status to other antibiotic agents status: Secondary | ICD-10-CM | POA: Diagnosis not present

## 2011-12-27 DIAGNOSIS — K56609 Unspecified intestinal obstruction, unspecified as to partial versus complete obstruction: Secondary | ICD-10-CM | POA: Diagnosis not present

## 2011-12-27 DIAGNOSIS — R935 Abnormal findings on diagnostic imaging of other abdominal regions, including retroperitoneum: Secondary | ICD-10-CM | POA: Diagnosis not present

## 2011-12-27 DIAGNOSIS — K551 Chronic vascular disorders of intestine: Secondary | ICD-10-CM | POA: Diagnosis not present

## 2011-12-27 DIAGNOSIS — R188 Other ascites: Secondary | ICD-10-CM | POA: Diagnosis not present

## 2011-12-27 DIAGNOSIS — K5000 Crohn's disease of small intestine without complications: Secondary | ICD-10-CM | POA: Diagnosis not present

## 2011-12-27 DIAGNOSIS — J9819 Other pulmonary collapse: Secondary | ICD-10-CM | POA: Diagnosis present

## 2011-12-27 DIAGNOSIS — Z8542 Personal history of malignant neoplasm of other parts of uterus: Secondary | ICD-10-CM | POA: Diagnosis not present

## 2011-12-27 DIAGNOSIS — K5289 Other specified noninfective gastroenteritis and colitis: Secondary | ICD-10-CM | POA: Diagnosis not present

## 2011-12-27 DIAGNOSIS — Z8544 Personal history of malignant neoplasm of other female genital organs: Secondary | ICD-10-CM | POA: Diagnosis not present

## 2011-12-27 DIAGNOSIS — I5033 Acute on chronic diastolic (congestive) heart failure: Secondary | ICD-10-CM | POA: Diagnosis not present

## 2011-12-27 DIAGNOSIS — E785 Hyperlipidemia, unspecified: Secondary | ICD-10-CM | POA: Diagnosis present

## 2011-12-27 DIAGNOSIS — R0602 Shortness of breath: Secondary | ICD-10-CM | POA: Diagnosis not present

## 2011-12-27 DIAGNOSIS — Z888 Allergy status to other drugs, medicaments and biological substances status: Secondary | ICD-10-CM | POA: Diagnosis not present

## 2011-12-27 DIAGNOSIS — R933 Abnormal findings on diagnostic imaging of other parts of digestive tract: Secondary | ICD-10-CM | POA: Diagnosis not present

## 2011-12-27 DIAGNOSIS — R109 Unspecified abdominal pain: Secondary | ICD-10-CM | POA: Diagnosis not present

## 2011-12-27 DIAGNOSIS — Z950 Presence of cardiac pacemaker: Secondary | ICD-10-CM | POA: Diagnosis not present

## 2011-12-27 DIAGNOSIS — I4891 Unspecified atrial fibrillation: Secondary | ICD-10-CM | POA: Diagnosis not present

## 2011-12-27 DIAGNOSIS — Z452 Encounter for adjustment and management of vascular access device: Secondary | ICD-10-CM | POA: Diagnosis not present

## 2011-12-27 DIAGNOSIS — R1013 Epigastric pain: Secondary | ICD-10-CM | POA: Diagnosis not present

## 2011-12-27 DIAGNOSIS — I509 Heart failure, unspecified: Secondary | ICD-10-CM | POA: Diagnosis present

## 2011-12-27 DIAGNOSIS — M7989 Other specified soft tissue disorders: Secondary | ICD-10-CM | POA: Diagnosis not present

## 2011-12-27 DIAGNOSIS — K589 Irritable bowel syndrome without diarrhea: Secondary | ICD-10-CM | POA: Diagnosis present

## 2011-12-27 DIAGNOSIS — Z923 Personal history of irradiation: Secondary | ICD-10-CM | POA: Diagnosis not present

## 2011-12-27 LAB — PROTIME-INR
INR: 1.3
Prothrombin Time: 16.2 secs — ABNORMAL HIGH (ref 11.5–14.7)

## 2011-12-28 LAB — BASIC METABOLIC PANEL
Anion Gap: 8 (ref 7–16)
BUN: 14 mg/dL (ref 7–18)
Calcium, Total: 8.1 mg/dL — ABNORMAL LOW (ref 8.5–10.1)
Chloride: 103 mmol/L (ref 98–107)
Co2: 30 mmol/L (ref 21–32)
Creatinine: 1.11 mg/dL (ref 0.60–1.30)
EGFR (African American): 52 — ABNORMAL LOW
EGFR (Non-African Amer.): 45 — ABNORMAL LOW
Glucose: 104 mg/dL — ABNORMAL HIGH (ref 65–99)
Osmolality: 282 (ref 275–301)
Potassium: 2.9 mmol/L — ABNORMAL LOW (ref 3.5–5.1)
Sodium: 141 mmol/L (ref 136–145)

## 2011-12-28 LAB — CBC WITH DIFFERENTIAL/PLATELET
Basophil #: 0 10*3/uL (ref 0.0–0.1)
Basophil %: 0.2 %
Eosinophil #: 0.1 10*3/uL (ref 0.0–0.7)
Eosinophil %: 1.3 %
HCT: 35 % (ref 35.0–47.0)
HGB: 12 g/dL (ref 12.0–16.0)
Lymphocyte #: 2.7 10*3/uL (ref 1.0–3.6)
Lymphocyte %: 38.7 %
MCH: 30.8 pg (ref 26.0–34.0)
MCHC: 34.2 g/dL (ref 32.0–36.0)
MCV: 90 fL (ref 80–100)
Monocyte #: 0.7 x10 3/mm (ref 0.2–0.9)
Monocyte %: 9.6 %
Neutrophil #: 3.5 10*3/uL (ref 1.4–6.5)
Neutrophil %: 50.2 %
Platelet: 194 10*3/uL (ref 150–440)
RBC: 3.89 10*6/uL (ref 3.80–5.20)
RDW: 13 % (ref 11.5–14.5)
WBC: 7 10*3/uL (ref 3.6–11.0)

## 2011-12-28 LAB — HEMOGLOBIN A1C: Hemoglobin A1C: 6.9 % — ABNORMAL HIGH (ref 4.2–6.3)

## 2011-12-29 LAB — PROTIME-INR
INR: 1.3
Prothrombin Time: 16.9 secs — ABNORMAL HIGH (ref 11.5–14.7)

## 2011-12-29 LAB — POTASSIUM: Potassium: 3.3 mmol/L — ABNORMAL LOW (ref 3.5–5.1)

## 2011-12-30 LAB — PROTIME-INR
INR: 1.6
Prothrombin Time: 19.7 secs — ABNORMAL HIGH (ref 11.5–14.7)

## 2011-12-31 LAB — BASIC METABOLIC PANEL
Anion Gap: 8 (ref 7–16)
BUN: 4 mg/dL — ABNORMAL LOW (ref 7–18)
Calcium, Total: 7.5 mg/dL — ABNORMAL LOW (ref 8.5–10.1)
Chloride: 110 mmol/L — ABNORMAL HIGH (ref 98–107)
Co2: 24 mmol/L (ref 21–32)
Creatinine: 1.06 mg/dL (ref 0.60–1.30)
EGFR (African American): 55 — ABNORMAL LOW
EGFR (Non-African Amer.): 47 — ABNORMAL LOW
Glucose: 193 mg/dL — ABNORMAL HIGH (ref 65–99)
Osmolality: 285 (ref 275–301)
Potassium: 3.7 mmol/L (ref 3.5–5.1)
Sodium: 142 mmol/L (ref 136–145)

## 2011-12-31 LAB — CBC WITH DIFFERENTIAL/PLATELET
Basophil #: 0 10*3/uL (ref 0.0–0.1)
Basophil %: 0.3 %
Eosinophil #: 0.1 10*3/uL (ref 0.0–0.7)
Eosinophil %: 1 %
HCT: 35.2 % (ref 35.0–47.0)
HGB: 11.7 g/dL — ABNORMAL LOW (ref 12.0–16.0)
Lymphocyte #: 1.9 10*3/uL (ref 1.0–3.6)
Lymphocyte %: 25.4 %
MCH: 30.3 pg (ref 26.0–34.0)
MCHC: 33.1 g/dL (ref 32.0–36.0)
MCV: 91 fL (ref 80–100)
Monocyte #: 0.8 x10 3/mm (ref 0.2–0.9)
Monocyte %: 10.7 %
Neutrophil #: 4.7 10*3/uL (ref 1.4–6.5)
Neutrophil %: 62.6 %
Platelet: 218 10*3/uL (ref 150–440)
RBC: 3.85 10*6/uL (ref 3.80–5.20)
RDW: 13.5 % (ref 11.5–14.5)
WBC: 7.6 10*3/uL (ref 3.6–11.0)

## 2011-12-31 LAB — CREATININE, SERUM
Creatinine: 0.96 mg/dL (ref 0.60–1.30)
EGFR (African American): >60
EGFR (Non-African Amer.): 53 — ABNORMAL LOW

## 2011-12-31 LAB — PLATELET COUNT: Platelet: 198 10*3/uL (ref 150–440)

## 2011-12-31 LAB — PROTIME-INR
INR: 1.4
Prothrombin Time: 17.2 secs — ABNORMAL HIGH (ref 11.5–14.7)

## 2012-01-03 LAB — BASIC METABOLIC PANEL
Anion Gap: 7 (ref 7–16)
BUN: 3 mg/dL — ABNORMAL LOW (ref 7–18)
Calcium, Total: 7.4 mg/dL — ABNORMAL LOW (ref 8.5–10.1)
Chloride: 105 mmol/L (ref 98–107)
Co2: 30 mmol/L (ref 21–32)
Creatinine: 0.86 mg/dL (ref 0.60–1.30)
EGFR (African American): >60
EGFR (Non-African Amer.): >60
Glucose: 154 mg/dL — ABNORMAL HIGH (ref 65–99)
Osmolality: 283 (ref 275–301)
Potassium: 3.3 mmol/L — ABNORMAL LOW (ref 3.5–5.1)
Sodium: 142 mmol/L (ref 136–145)

## 2012-01-03 LAB — CBC WITH DIFFERENTIAL/PLATELET
Basophil #: 0 10*3/uL (ref 0.0–0.1)
Basophil %: 0.3 %
Eosinophil #: 0 10*3/uL (ref 0.0–0.7)
Eosinophil %: 0 %
HCT: 38.2 % (ref 35.0–47.0)
HGB: 12.7 g/dL (ref 12.0–16.0)
Lymphocyte #: 2.1 10*3/uL (ref 1.0–3.6)
Lymphocyte %: 14.8 %
MCH: 30.3 pg (ref 26.0–34.0)
MCHC: 33.2 g/dL (ref 32.0–36.0)
MCV: 91 fL (ref 80–100)
Monocyte #: 1.6 x10 3/mm — ABNORMAL HIGH (ref 0.2–0.9)
Monocyte %: 11.2 %
Neutrophil #: 10.5 10*3/uL — ABNORMAL HIGH (ref 1.4–6.5)
Neutrophil %: 73.7 %
Platelet: 248 10*3/uL (ref 150–440)
RBC: 4.19 10*6/uL (ref 3.80–5.20)
RDW: 13.6 % (ref 11.5–14.5)
WBC: 14.3 10*3/uL — ABNORMAL HIGH (ref 3.6–11.0)

## 2012-01-04 LAB — BASIC METABOLIC PANEL
Anion Gap: 7 (ref 7–16)
BUN: 8 mg/dL (ref 7–18)
Calcium, Total: 6.3 mg/dL — CL (ref 8.5–10.1)
Chloride: 110 mmol/L — ABNORMAL HIGH (ref 98–107)
Co2: 26 mmol/L (ref 21–32)
Creatinine: 0.85 mg/dL (ref 0.60–1.30)
EGFR (African American): >60
EGFR (Non-African Amer.): >60
Glucose: 115 mg/dL — ABNORMAL HIGH (ref 65–99)
Osmolality: 284 (ref 275–301)
Potassium: 3.2 mmol/L — ABNORMAL LOW (ref 3.5–5.1)
Sodium: 143 mmol/L (ref 136–145)

## 2012-01-04 LAB — CBC WITH DIFFERENTIAL/PLATELET
Basophil #: 0 10*3/uL (ref 0.0–0.1)
Basophil %: 0.1 %
Eosinophil #: 0.1 10*3/uL (ref 0.0–0.7)
Eosinophil %: 0.5 %
HCT: 32.8 % — ABNORMAL LOW (ref 35.0–47.0)
HGB: 10.6 g/dL — ABNORMAL LOW (ref 12.0–16.0)
Lymphocyte #: 2.2 10*3/uL (ref 1.0–3.6)
Lymphocyte %: 19.7 %
MCH: 29.5 pg (ref 26.0–34.0)
MCHC: 32.2 g/dL (ref 32.0–36.0)
MCV: 91 fL (ref 80–100)
Monocyte #: 1.6 x10 3/mm — ABNORMAL HIGH (ref 0.2–0.9)
Monocyte %: 14 %
Neutrophil #: 7.4 10*3/uL — ABNORMAL HIGH (ref 1.4–6.5)
Neutrophil %: 65.7 %
Platelet: 199 10*3/uL (ref 150–440)
RBC: 3.59 10*6/uL — ABNORMAL LOW (ref 3.80–5.20)
RDW: 13.9 % (ref 11.5–14.5)
WBC: 11.3 10*3/uL — ABNORMAL HIGH (ref 3.6–11.0)

## 2012-01-04 LAB — PHOSPHORUS: Phosphorus: 2.6 mg/dL (ref 2.5–4.9)

## 2012-01-04 LAB — MAGNESIUM: Magnesium: 0.8 mg/dL — ABNORMAL LOW

## 2012-01-05 LAB — PHOSPHORUS
Phosphorus: 1.2 mg/dL — ABNORMAL LOW (ref 2.5–4.9)
Phosphorus: 1.3 mg/dL — ABNORMAL LOW (ref 2.5–4.9)

## 2012-01-05 LAB — CBC WITH DIFFERENTIAL/PLATELET
Basophil #: 0 10*3/uL (ref 0.0–0.1)
Basophil %: 0.1 %
Eosinophil #: 0.1 10*3/uL (ref 0.0–0.7)
Eosinophil %: 0.7 %
HCT: 29.5 % — ABNORMAL LOW (ref 35.0–47.0)
HGB: 9.7 g/dL — ABNORMAL LOW (ref 12.0–16.0)
Lymphocyte #: 1.3 10*3/uL (ref 1.0–3.6)
Lymphocyte %: 15.1 %
MCH: 29.9 pg (ref 26.0–34.0)
MCHC: 33 g/dL (ref 32.0–36.0)
MCV: 91 fL (ref 80–100)
Monocyte #: 1 x10 3/mm — ABNORMAL HIGH (ref 0.2–0.9)
Monocyte %: 11.3 %
Neutrophil #: 6.4 10*3/uL (ref 1.4–6.5)
Neutrophil %: 72.8 %
Platelet: 175 10*3/uL (ref 150–440)
RBC: 3.25 10*6/uL — ABNORMAL LOW (ref 3.80–5.20)
RDW: 13.7 % (ref 11.5–14.5)
WBC: 8.8 10*3/uL (ref 3.6–11.0)

## 2012-01-05 LAB — BASIC METABOLIC PANEL
Anion Gap: 6 — ABNORMAL LOW (ref 7–16)
BUN: 11 mg/dL (ref 7–18)
Calcium, Total: 7.2 mg/dL — ABNORMAL LOW (ref 8.5–10.1)
Chloride: 104 mmol/L (ref 98–107)
Co2: 29 mmol/L (ref 21–32)
Creatinine: 0.84 mg/dL (ref 0.60–1.30)
EGFR (African American): >60
EGFR (Non-African Amer.): >60
Glucose: 287 mg/dL — ABNORMAL HIGH (ref 65–99)
Osmolality: 287 (ref 275–301)
Potassium: 3.1 mmol/L — ABNORMAL LOW (ref 3.5–5.1)
Sodium: 139 mmol/L (ref 136–145)

## 2012-01-05 LAB — ALBUMIN: Albumin: 1.5 g/dL — ABNORMAL LOW (ref 3.4–5.0)

## 2012-01-05 LAB — MAGNESIUM: Magnesium: 1.7 mg/dL — ABNORMAL LOW

## 2012-01-06 LAB — PHOSPHORUS: Phosphorus: 1.6 mg/dL — ABNORMAL LOW (ref 2.5–4.9)

## 2012-01-06 LAB — SODIUM: Sodium: 140 mmol/L (ref 136–145)

## 2012-01-06 LAB — CALCIUM: Calcium, Total: 7.2 mg/dL — ABNORMAL LOW (ref 8.5–10.1)

## 2012-01-06 LAB — POTASSIUM: Potassium: 2.8 mmol/L — ABNORMAL LOW (ref 3.5–5.1)

## 2012-01-06 LAB — MAGNESIUM: Magnesium: 1.7 mg/dL — ABNORMAL LOW

## 2012-01-07 LAB — CBC WITH DIFFERENTIAL/PLATELET
Basophil #: 0 10*3/uL (ref 0.0–0.1)
Basophil %: 0.5 %
Eosinophil #: 0.2 10*3/uL (ref 0.0–0.7)
Eosinophil %: 2 %
HCT: 32.3 % — ABNORMAL LOW (ref 35.0–47.0)
HGB: 10.5 g/dL — ABNORMAL LOW (ref 12.0–16.0)
Lymphocyte #: 1.8 10*3/uL (ref 1.0–3.6)
Lymphocyte %: 20.9 %
MCH: 29.4 pg (ref 26.0–34.0)
MCHC: 32.6 g/dL (ref 32.0–36.0)
MCV: 90 fL (ref 80–100)
Monocyte #: 1 x10 3/mm — ABNORMAL HIGH (ref 0.2–0.9)
Monocyte %: 12.2 %
Neutrophil #: 5.5 10*3/uL (ref 1.4–6.5)
Neutrophil %: 64.4 %
Platelet: 223 10*3/uL (ref 150–440)
RBC: 3.57 10*6/uL — ABNORMAL LOW (ref 3.80–5.20)
RDW: 13.6 % (ref 11.5–14.5)
WBC: 8.6 10*3/uL (ref 3.6–11.0)

## 2012-01-07 LAB — PHOSPHORUS: Phosphorus: 1.9 mg/dL — ABNORMAL LOW (ref 2.5–4.9)

## 2012-01-07 LAB — PATHOLOGY REPORT

## 2012-01-07 LAB — BASIC METABOLIC PANEL
Anion Gap: 5 — ABNORMAL LOW (ref 7–16)
BUN: 7 mg/dL (ref 7–18)
Calcium, Total: 7.6 mg/dL — ABNORMAL LOW (ref 8.5–10.1)
Chloride: 100 mmol/L (ref 98–107)
Co2: 37 mmol/L — ABNORMAL HIGH (ref 21–32)
Creatinine: 0.67 mg/dL (ref 0.60–1.30)
EGFR (African American): >60
EGFR (Non-African Amer.): >60
Glucose: 199 mg/dL — ABNORMAL HIGH (ref 65–99)
Osmolality: 287 (ref 275–301)
Potassium: 2.9 mmol/L — ABNORMAL LOW (ref 3.5–5.1)
Sodium: 142 mmol/L (ref 136–145)

## 2012-01-07 LAB — MAGNESIUM: Magnesium: 2.2 mg/dL

## 2012-01-08 LAB — PHOSPHORUS: Phosphorus: 3.1 mg/dL (ref 2.5–4.9)

## 2012-01-08 LAB — SODIUM: Sodium: 139 mmol/L (ref 136–145)

## 2012-01-08 LAB — CALCIUM: Calcium, Total: 8 mg/dL — ABNORMAL LOW (ref 8.5–10.1)

## 2012-01-08 LAB — MAGNESIUM: Magnesium: 2.1 mg/dL

## 2012-01-08 LAB — POTASSIUM: Potassium: 3.5 mmol/L (ref 3.5–5.1)

## 2012-01-09 LAB — CALCIUM: Calcium, Total: 7.9 mg/dL — ABNORMAL LOW (ref 8.5–10.1)

## 2012-01-09 LAB — POTASSIUM: Potassium: 3.8 mmol/L (ref 3.5–5.1)

## 2012-01-09 LAB — PHOSPHORUS: Phosphorus: 2.9 mg/dL (ref 2.5–4.9)

## 2012-01-09 LAB — MAGNESIUM: Magnesium: 2 mg/dL

## 2012-01-11 LAB — MAGNESIUM: Magnesium: 2.2 mg/dL

## 2012-01-11 LAB — CALCIUM: Calcium, Total: 8.2 mg/dL — ABNORMAL LOW (ref 8.5–10.1)

## 2012-01-11 LAB — POTASSIUM: Potassium: 4.3 mmol/L (ref 3.5–5.1)

## 2012-01-11 LAB — PHOSPHORUS: Phosphorus: 3.2 mg/dL (ref 2.5–4.9)

## 2012-01-12 DIAGNOSIS — I059 Rheumatic mitral valve disease, unspecified: Secondary | ICD-10-CM

## 2012-01-12 LAB — BASIC METABOLIC PANEL
Anion Gap: 2 — ABNORMAL LOW (ref 7–16)
BUN: 17 mg/dL (ref 7–18)
Calcium, Total: 8.2 mg/dL — ABNORMAL LOW (ref 8.5–10.1)
Chloride: 99 mmol/L (ref 98–107)
Co2: 40 mmol/L (ref 21–32)
Creatinine: 0.75 mg/dL (ref 0.60–1.30)
EGFR (African American): >60
EGFR (Non-African Amer.): >60
Glucose: 179 mg/dL — ABNORMAL HIGH (ref 65–99)
Osmolality: 287 (ref 275–301)
Potassium: 3.9 mmol/L (ref 3.5–5.1)
Sodium: 141 mmol/L (ref 136–145)

## 2012-01-12 LAB — CALCIUM: Calcium, Total: 8.2 mg/dL — ABNORMAL LOW (ref 8.5–10.1)

## 2012-01-12 LAB — CLOSTRIDIUM DIFFICILE BY PCR

## 2012-01-12 LAB — PHOSPHORUS: Phosphorus: 3.4 mg/dL (ref 2.5–4.9)

## 2012-01-12 LAB — POTASSIUM: Potassium: 4 mmol/L (ref 3.5–5.1)

## 2012-01-12 LAB — MAGNESIUM: Magnesium: 2.2 mg/dL

## 2012-01-13 LAB — CBC WITH DIFFERENTIAL/PLATELET
Basophil #: 0 10*3/uL (ref 0.0–0.1)
Basophil %: 0.5 %
Eosinophil #: 0.1 10*3/uL (ref 0.0–0.7)
Eosinophil %: 2.6 %
HCT: 30.6 % — ABNORMAL LOW (ref 35.0–47.0)
HGB: 9.8 g/dL — ABNORMAL LOW (ref 12.0–16.0)
Lymphocyte #: 2 10*3/uL (ref 1.0–3.6)
Lymphocyte %: 34.3 %
MCH: 29.2 pg (ref 26.0–34.0)
MCHC: 32 g/dL (ref 32.0–36.0)
MCV: 91 fL (ref 80–100)
Monocyte #: 0.6 x10 3/mm (ref 0.2–0.9)
Monocyte %: 11 %
Neutrophil #: 3 10*3/uL (ref 1.4–6.5)
Neutrophil %: 51.6 %
Platelet: 247 10*3/uL (ref 150–440)
RBC: 3.35 10*6/uL — ABNORMAL LOW (ref 3.80–5.20)
RDW: 13.6 % (ref 11.5–14.5)
WBC: 5.8 10*3/uL (ref 3.6–11.0)

## 2012-01-13 LAB — BASIC METABOLIC PANEL
Anion Gap: 2 — ABNORMAL LOW (ref 7–16)
BUN: 14 mg/dL (ref 7–18)
Calcium, Total: 8.3 mg/dL — ABNORMAL LOW (ref 8.5–10.1)
Chloride: 98 mmol/L (ref 98–107)
Co2: 41 mmol/L (ref 21–32)
Creatinine: 0.78 mg/dL (ref 0.60–1.30)
EGFR (African American): >60
EGFR (Non-African Amer.): >60
Glucose: 79 mg/dL (ref 65–99)
Osmolality: 281 (ref 275–301)
Potassium: 3.5 mmol/L (ref 3.5–5.1)
Sodium: 141 mmol/L (ref 136–145)

## 2012-01-15 DIAGNOSIS — I5033 Acute on chronic diastolic (congestive) heart failure: Secondary | ICD-10-CM | POA: Diagnosis not present

## 2012-01-15 DIAGNOSIS — R0902 Hypoxemia: Secondary | ICD-10-CM | POA: Diagnosis not present

## 2012-01-15 DIAGNOSIS — K5000 Crohn's disease of small intestine without complications: Secondary | ICD-10-CM | POA: Diagnosis not present

## 2012-01-15 DIAGNOSIS — E039 Hypothyroidism, unspecified: Secondary | ICD-10-CM | POA: Diagnosis not present

## 2012-01-15 DIAGNOSIS — R197 Diarrhea, unspecified: Secondary | ICD-10-CM | POA: Diagnosis not present

## 2012-01-15 DIAGNOSIS — E876 Hypokalemia: Secondary | ICD-10-CM | POA: Diagnosis not present

## 2012-01-15 DIAGNOSIS — K520 Gastroenteritis and colitis due to radiation: Secondary | ICD-10-CM | POA: Diagnosis not present

## 2012-01-15 DIAGNOSIS — Z5189 Encounter for other specified aftercare: Secondary | ICD-10-CM | POA: Diagnosis not present

## 2012-01-15 DIAGNOSIS — Z923 Personal history of irradiation: Secondary | ICD-10-CM | POA: Diagnosis not present

## 2012-01-15 DIAGNOSIS — D510 Vitamin B12 deficiency anemia due to intrinsic factor deficiency: Secondary | ICD-10-CM | POA: Diagnosis not present

## 2012-01-15 DIAGNOSIS — M6281 Muscle weakness (generalized): Secondary | ICD-10-CM | POA: Diagnosis not present

## 2012-01-15 DIAGNOSIS — I2510 Atherosclerotic heart disease of native coronary artery without angina pectoris: Secondary | ICD-10-CM | POA: Diagnosis not present

## 2012-01-15 DIAGNOSIS — Z8544 Personal history of malignant neoplasm of other female genital organs: Secondary | ICD-10-CM | POA: Diagnosis not present

## 2012-01-15 DIAGNOSIS — R5381 Other malaise: Secondary | ICD-10-CM | POA: Diagnosis not present

## 2012-01-15 DIAGNOSIS — D649 Anemia, unspecified: Secondary | ICD-10-CM | POA: Diagnosis not present

## 2012-01-15 DIAGNOSIS — I4891 Unspecified atrial fibrillation: Secondary | ICD-10-CM | POA: Diagnosis not present

## 2012-01-15 DIAGNOSIS — Z48815 Encounter for surgical aftercare following surgery on the digestive system: Secondary | ICD-10-CM | POA: Diagnosis not present

## 2012-01-15 DIAGNOSIS — G4730 Sleep apnea, unspecified: Secondary | ICD-10-CM | POA: Diagnosis not present

## 2012-01-15 DIAGNOSIS — K56609 Unspecified intestinal obstruction, unspecified as to partial versus complete obstruction: Secondary | ICD-10-CM | POA: Diagnosis not present

## 2012-01-15 DIAGNOSIS — I119 Hypertensive heart disease without heart failure: Secondary | ICD-10-CM | POA: Diagnosis not present

## 2012-01-15 DIAGNOSIS — K589 Irritable bowel syndrome without diarrhea: Secondary | ICD-10-CM | POA: Diagnosis not present

## 2012-01-15 DIAGNOSIS — R5383 Other fatigue: Secondary | ICD-10-CM | POA: Diagnosis not present

## 2012-01-15 DIAGNOSIS — D72829 Elevated white blood cell count, unspecified: Secondary | ICD-10-CM | POA: Diagnosis not present

## 2012-01-15 LAB — BASIC METABOLIC PANEL
Anion Gap: 9 (ref 7–16)
BUN: 13 mg/dL (ref 7–18)
Calcium, Total: 8.1 mg/dL — ABNORMAL LOW (ref 8.5–10.1)
Chloride: 105 mmol/L (ref 98–107)
Co2: 26 mmol/L (ref 21–32)
Creatinine: 0.97 mg/dL (ref 0.60–1.30)
EGFR (African American): >60
EGFR (Non-African Amer.): 53 — ABNORMAL LOW
Glucose: 128 mg/dL — ABNORMAL HIGH (ref 65–99)
Osmolality: 281 (ref 275–301)
Potassium: 3.1 mmol/L — ABNORMAL LOW (ref 3.5–5.1)
Sodium: 140 mmol/L (ref 136–145)

## 2012-01-15 LAB — PROTIME-INR
INR: 1
Prothrombin Time: 13.8 secs (ref 11.5–14.7)

## 2012-01-16 ENCOUNTER — Encounter: Payer: Self-pay | Admitting: Internal Medicine

## 2012-01-16 DIAGNOSIS — R197 Diarrhea, unspecified: Secondary | ICD-10-CM | POA: Diagnosis not present

## 2012-01-16 DIAGNOSIS — I4891 Unspecified atrial fibrillation: Secondary | ICD-10-CM | POA: Diagnosis not present

## 2012-01-18 ENCOUNTER — Encounter: Payer: Self-pay | Admitting: Internal Medicine

## 2012-01-20 LAB — PROTIME-INR
INR: 1.2
Prothrombin Time: 15.4 secs — ABNORMAL HIGH (ref 11.5–14.7)

## 2012-01-23 ENCOUNTER — Telehealth: Payer: Self-pay | Admitting: Internal Medicine

## 2012-01-23 NOTE — Telephone Encounter (Signed)
Patient needing hospital follow up scheduled on 9.18.13 @ 10:30.

## 2012-01-23 NOTE — Telephone Encounter (Signed)
Left message asking patient to call.

## 2012-01-27 LAB — PROTIME-INR
INR: 1.3
Prothrombin Time: 16.1 secs — ABNORMAL HIGH (ref 11.5–14.7)

## 2012-01-27 NOTE — Telephone Encounter (Signed)
Message left advising patient of appointment info. Instructed to call with any questions or if she needed to reschedule.

## 2012-01-28 DIAGNOSIS — I2510 Atherosclerotic heart disease of native coronary artery without angina pectoris: Secondary | ICD-10-CM | POA: Diagnosis not present

## 2012-01-28 DIAGNOSIS — I4891 Unspecified atrial fibrillation: Secondary | ICD-10-CM | POA: Diagnosis not present

## 2012-01-28 DIAGNOSIS — I119 Hypertensive heart disease without heart failure: Secondary | ICD-10-CM | POA: Diagnosis not present

## 2012-01-28 DIAGNOSIS — G4730 Sleep apnea, unspecified: Secondary | ICD-10-CM | POA: Diagnosis not present

## 2012-01-30 ENCOUNTER — Encounter: Payer: Medicare Other | Admitting: Internal Medicine

## 2012-02-03 LAB — PROTIME-INR
INR: 1.3
Prothrombin Time: 16.9 secs — ABNORMAL HIGH (ref 11.5–14.7)

## 2012-02-04 ENCOUNTER — Encounter: Payer: Self-pay | Admitting: Internal Medicine

## 2012-02-04 ENCOUNTER — Ambulatory Visit (INDEPENDENT_AMBULATORY_CARE_PROVIDER_SITE_OTHER): Payer: Medicare Other | Admitting: Internal Medicine

## 2012-02-04 VITALS — BP 130/80 | HR 69 | Temp 98.4°F | Ht 62.0 in | Wt 132.0 lb

## 2012-02-04 DIAGNOSIS — Z9889 Other specified postprocedural states: Secondary | ICD-10-CM

## 2012-02-04 DIAGNOSIS — D649 Anemia, unspecified: Secondary | ICD-10-CM

## 2012-02-04 DIAGNOSIS — D510 Vitamin B12 deficiency anemia due to intrinsic factor deficiency: Secondary | ICD-10-CM | POA: Diagnosis not present

## 2012-02-04 DIAGNOSIS — E039 Hypothyroidism, unspecified: Secondary | ICD-10-CM | POA: Diagnosis not present

## 2012-02-04 DIAGNOSIS — R5381 Other malaise: Secondary | ICD-10-CM

## 2012-02-04 DIAGNOSIS — Z7901 Long term (current) use of anticoagulants: Secondary | ICD-10-CM

## 2012-02-04 DIAGNOSIS — R5383 Other fatigue: Secondary | ICD-10-CM | POA: Diagnosis not present

## 2012-02-04 DIAGNOSIS — Z9049 Acquired absence of other specified parts of digestive tract: Secondary | ICD-10-CM | POA: Insufficient documentation

## 2012-02-04 NOTE — Assessment & Plan Note (Addendum)
Symptoms of generalized fatigue after recent hospitalization and bowel resection. Exam is normal today. Will check labs including CBC, CMP, B12, and TSH. Patient has followup with her general surgeon next week. Followup here in 4 weeks.

## 2012-02-04 NOTE — Assessment & Plan Note (Signed)
Chronic anticoagulation for atrial fibrillation. Patient reports that recent INR was drawn yesterday. She is unsure of results. We'll try to obtain results from her rehabilitation facility.

## 2012-02-04 NOTE — Assessment & Plan Note (Signed)
Will obtain notes from recent hospitalization and operative procedure. Patient appears to be recovering well abdominal exam is normal today.

## 2012-02-04 NOTE — Progress Notes (Signed)
Subjective:    Patient ID: Kara [name], female    DOB: 03/28/1923, 78 y.o.   MRN: [id number]  HPI 78 year old female presents for followup after recent hospitalization for colitis requiring bowel resection.(discharge summary and operative note not available at time of visit) she reports she is generally doing well. She notes some ongoing fatigue. She attributes this to her medications. She denies any abdominal pain, fever, change in bowel habits. She reports that she does pass gas frequently. Her stools are described as soft and brown. She has followup with her general surgeon next week. She is working on completing physical therapy and plans to return home once complete.  In regards to age of fibrillation, she reports full compliance with Coumadin. She notes that INR was drawn at her facility yesterday. She is unsure of results. She denies any easy bruising or bleeding. She denies any palpitations, chest pain, shortness of breath.  Outpatient Encounter Prescriptions as of 02/04/2012  Medication Sig Dispense Refill  . acetaZOLAMIDE (DIAMOX) 250 MG tablet Take 250 mg by mouth 4 (four) times daily.      Marland Kitchen ALPRAZolam (XANAX) 0.25 MG tablet Take 0.25 mg by mouth every 8 (eight) hours as needed.      . Calcium Carbonate-Vitamin D (CALCIUM-D) 600-400 MG-UNIT TABS Take 1 tablet by mouth daily.      . cholecalciferol (VITAMIN D) 1000 UNITS tablet Take 1,000 Units by mouth daily.      . cholestyramine (QUESTRAN) 4 G packet Take 1 packet by mouth daily.      . cyanocobalamin (,VITAMIN B-12,) 1000 MCG/ML injection Inject 1 mL (1,000 mcg total) into the muscle every 30 (thirty) days.  10 mL  1  . diltiazem (CARDIZEM CD) 180 MG 24 hr capsule Take 180 mg by mouth daily.      . Hypromellose (GENTEAL) 0.3 % SOLN Apply 1 drop to eye daily.      Marland Kitchen lisinopril (PRINIVIL,ZESTRIL) 5 MG tablet Take 5 mg by mouth daily.      Marland Kitchen loperamide (IMODIUM) 2 MG capsule Take 2 mg by mouth every 8 (eight) hours as needed.      .  metoprolol (LOPRESSOR) 50 MG tablet Take 50 mg by mouth daily.      . pantoprazole (PROTONIX) 20 MG tablet Take 20 mg by mouth daily.      . potassium chloride (MICRO-K) 10 MEQ CR capsule Take 10 mEq by mouth 2 (two) times daily.      . traMADol (ULTRAM) 50 MG tablet Take 50 mg by mouth every 6 (six) hours as needed.      . warfarin (COUMADIN) 1 MG tablet Take 1.5mg  on Monday, Wednesday, and Friday      . warfarin (COUMADIN) 2 MG tablet Take 2 mg by mouth daily. 2 mg 4 days a week and 1 mg 3 days a week      . zolpidem (AMBIEN) 5 MG tablet Take 1 tablet (5 mg total) by mouth at bedtime as needed for sleep.  30 tablet  0  . nystatin-triamcinolone ointment (MYCOLOG) Apply topically 2 (two) times daily.  30 g  0  . triamterene-hydrochlorothiazide (MAXZIDE-25) 37.5-25 MG per tablet Take 1 tablet by mouth daily.       BP 130/80  Pulse 69  Temp 98.4 F (36.9 C) (Oral)  Ht 5\' 2"  (1.575 m)  Wt 132 lb (59.875 kg)  BMI 24.14 kg/m2  SpO2 98%  Review of Systems  Constitutional: Positive for fatigue. Negative for fever, chills,  appetite change and unexpected weight change.  HENT: Negative for ear pain, congestion, sore throat, trouble swallowing, neck pain, voice change and sinus pressure.   Eyes: Negative for visual disturbance.  Respiratory: Negative for cough, shortness of breath, wheezing and stridor.   Cardiovascular: Negative for chest pain, palpitations and leg swelling.  Gastrointestinal: Negative for nausea, vomiting, abdominal pain, diarrhea, constipation, blood in stool, abdominal distention and anal bleeding.  Genitourinary: Negative for dysuria and flank pain.  Musculoskeletal: Negative for myalgias, arthralgias and gait problem.  Skin: Negative for color change and rash.  Neurological: Negative for dizziness and headaches.  Hematological: Negative for adenopathy. Does not bruise/bleed easily.  Psychiatric/Behavioral: Negative for suicidal ideas, disturbed wake/sleep cycle and  dysphoric mood. The patient is not nervous/anxious.        Objective:   Physical Exam  Constitutional: She is oriented to person, place, and time. She appears well-developed and well-nourished. No distress.  HENT:  Head: Normocephalic and atraumatic.  Right Ear: External ear normal.  Left Ear: External ear normal.  Nose: Nose normal.  Mouth/Throat: Oropharynx is clear and moist. No oropharyngeal exudate.  Eyes: Conjunctivae normal are normal. Pupils are equal, round, and reactive to light. Right eye exhibits no discharge. Left eye exhibits no discharge. No scleral icterus.  Neck: Normal range of motion. Neck supple. No tracheal deviation present. No thyromegaly present.  Cardiovascular: Normal rate, regular rhythm, normal heart sounds and intact distal pulses.  Exam reveals no gallop and no friction rub.   No murmur heard. Pulmonary/Chest: Effort normal and breath sounds normal. No respiratory distress. She has no wheezes. She has no rales. She exhibits no tenderness.  Abdominal: Soft. Bowel sounds are normal. She exhibits no distension. There is no tenderness.    Musculoskeletal: Normal range of motion. She exhibits no edema and no tenderness.  Lymphadenopathy:    She has no cervical adenopathy.  Neurological: She is alert and oriented to person, place, and time. No cranial nerve deficit. She exhibits normal muscle tone. Coordination normal.  Skin: Skin is warm and dry. No rash noted. She is not diaphoretic. No erythema. No pallor.  Psychiatric: She has a normal mood and affect. Her behavior is normal. Judgment and thought content normal.          Assessment & Plan:

## 2012-02-05 LAB — COMPREHENSIVE METABOLIC PANEL
Albumin: 2.5 g/dL — ABNORMAL LOW (ref 3.4–5.0)
Alkaline Phosphatase: 76 U/L (ref 50–136)
Anion Gap: 10 (ref 7–16)
BUN: 12 mg/dL (ref 7–18)
Bilirubin,Total: 0.7 mg/dL (ref 0.2–1.0)
Calcium, Total: 8.6 mg/dL (ref 8.5–10.1)
Chloride: 109 mmol/L — ABNORMAL HIGH (ref 98–107)
Co2: 21 mmol/L (ref 21–32)
Creatinine: 0.67 mg/dL (ref 0.60–1.30)
EGFR (African American): >60
EGFR (Non-African Amer.): >60
Glucose: 119 mg/dL — ABNORMAL HIGH (ref 65–99)
Osmolality: 280 (ref 275–301)
Potassium: 2.7 mmol/L — ABNORMAL LOW (ref 3.5–5.1)
SGOT(AST): 17 U/L (ref 15–37)
SGPT (ALT): 19 U/L (ref 12–78)
Sodium: 140 mmol/L (ref 136–145)
Total Protein: 5.7 g/dL — ABNORMAL LOW (ref 6.4–8.2)

## 2012-02-05 LAB — CBC WITH DIFFERENTIAL/PLATELET
Basophil #: 0 10*3/uL (ref 0.0–0.1)
Basophil %: 0.5 %
Eosinophil #: 0.1 10*3/uL (ref 0.0–0.7)
Eosinophil %: 2 %
HCT: 33.9 % — ABNORMAL LOW (ref 35.0–47.0)
HGB: 11.3 g/dL — ABNORMAL LOW (ref 12.0–16.0)
Lymphocyte #: 2.3 10*3/uL (ref 1.0–3.6)
Lymphocyte %: 32.9 %
MCH: 29.7 pg (ref 26.0–34.0)
MCHC: 33.3 g/dL (ref 32.0–36.0)
MCV: 90 fL (ref 80–100)
Monocyte #: 0.6 x10 3/mm (ref 0.2–0.9)
Monocyte %: 7.9 %
Neutrophil #: 4 10*3/uL (ref 1.4–6.5)
Neutrophil %: 56.7 %
Platelet: 205 10*3/uL (ref 150–440)
RBC: 3.79 10*6/uL — ABNORMAL LOW (ref 3.80–5.20)
RDW: 14.1 % (ref 11.5–14.5)
WBC: 7.1 10*3/uL (ref 3.6–11.0)

## 2012-02-05 LAB — TSH: Thyroid Stimulating Horm: 1.94 u[IU]/mL

## 2012-02-06 DIAGNOSIS — E876 Hypokalemia: Secondary | ICD-10-CM | POA: Diagnosis not present

## 2012-02-10 LAB — BASIC METABOLIC PANEL
Anion Gap: 10 (ref 7–16)
BUN: 14 mg/dL (ref 7–18)
Calcium, Total: 8.6 mg/dL (ref 8.5–10.1)
Chloride: 110 mmol/L — ABNORMAL HIGH (ref 98–107)
Co2: 23 mmol/L (ref 21–32)
Creatinine: 0.74 mg/dL (ref 0.60–1.30)
EGFR (African American): >60
EGFR (Non-African Amer.): >60
Glucose: 131 mg/dL — ABNORMAL HIGH (ref 65–99)
Osmolality: 287 (ref 275–301)
Potassium: 3.3 mmol/L — ABNORMAL LOW (ref 3.5–5.1)
Sodium: 143 mmol/L (ref 136–145)

## 2012-02-10 LAB — PROTIME-INR
INR: 1.3
Prothrombin Time: 16.3 secs — ABNORMAL HIGH (ref 11.5–14.7)

## 2012-02-10 LAB — MAGNESIUM: Magnesium: 1.8 mg/dL

## 2012-02-12 LAB — BASIC METABOLIC PANEL
Anion Gap: 9 (ref 7–16)
BUN: 12 mg/dL (ref 7–18)
Calcium, Total: 8.7 mg/dL (ref 8.5–10.1)
Chloride: 110 mmol/L — ABNORMAL HIGH (ref 98–107)
Co2: 23 mmol/L (ref 21–32)
Creatinine: 0.65 mg/dL (ref 0.60–1.30)
EGFR (African American): >60
EGFR (Non-African Amer.): >60
Glucose: 131 mg/dL — ABNORMAL HIGH (ref 65–99)
Osmolality: 285 (ref 275–301)
Potassium: 3.1 mmol/L — ABNORMAL LOW (ref 3.5–5.1)
Sodium: 142 mmol/L (ref 136–145)

## 2012-02-12 LAB — MAGNESIUM: Magnesium: 1.8 mg/dL

## 2012-02-15 DIAGNOSIS — K589 Irritable bowel syndrome without diarrhea: Secondary | ICD-10-CM | POA: Diagnosis not present

## 2012-02-15 DIAGNOSIS — Z48815 Encounter for surgical aftercare following surgery on the digestive system: Secondary | ICD-10-CM | POA: Diagnosis not present

## 2012-02-15 DIAGNOSIS — K219 Gastro-esophageal reflux disease without esophagitis: Secondary | ICD-10-CM | POA: Diagnosis not present

## 2012-02-15 DIAGNOSIS — E789 Disorder of lipoprotein metabolism, unspecified: Secondary | ICD-10-CM | POA: Diagnosis not present

## 2012-02-15 DIAGNOSIS — I2510 Atherosclerotic heart disease of native coronary artery without angina pectoris: Secondary | ICD-10-CM | POA: Diagnosis not present

## 2012-02-15 DIAGNOSIS — I4891 Unspecified atrial fibrillation: Secondary | ICD-10-CM | POA: Diagnosis not present

## 2012-02-16 DIAGNOSIS — K589 Irritable bowel syndrome without diarrhea: Secondary | ICD-10-CM | POA: Diagnosis not present

## 2012-02-16 DIAGNOSIS — I2510 Atherosclerotic heart disease of native coronary artery without angina pectoris: Secondary | ICD-10-CM | POA: Diagnosis not present

## 2012-02-16 DIAGNOSIS — E789 Disorder of lipoprotein metabolism, unspecified: Secondary | ICD-10-CM | POA: Diagnosis not present

## 2012-02-16 DIAGNOSIS — K219 Gastro-esophageal reflux disease without esophagitis: Secondary | ICD-10-CM | POA: Diagnosis not present

## 2012-02-16 DIAGNOSIS — Z48815 Encounter for surgical aftercare following surgery on the digestive system: Secondary | ICD-10-CM | POA: Diagnosis not present

## 2012-02-16 DIAGNOSIS — I4891 Unspecified atrial fibrillation: Secondary | ICD-10-CM | POA: Diagnosis not present

## 2012-02-17 DIAGNOSIS — E789 Disorder of lipoprotein metabolism, unspecified: Secondary | ICD-10-CM | POA: Diagnosis not present

## 2012-02-17 DIAGNOSIS — I4891 Unspecified atrial fibrillation: Secondary | ICD-10-CM | POA: Diagnosis not present

## 2012-02-17 DIAGNOSIS — K219 Gastro-esophageal reflux disease without esophagitis: Secondary | ICD-10-CM | POA: Diagnosis not present

## 2012-02-17 DIAGNOSIS — I2510 Atherosclerotic heart disease of native coronary artery without angina pectoris: Secondary | ICD-10-CM | POA: Diagnosis not present

## 2012-02-17 DIAGNOSIS — Z48815 Encounter for surgical aftercare following surgery on the digestive system: Secondary | ICD-10-CM | POA: Diagnosis not present

## 2012-02-17 DIAGNOSIS — K589 Irritable bowel syndrome without diarrhea: Secondary | ICD-10-CM | POA: Diagnosis not present

## 2012-02-18 DIAGNOSIS — I4891 Unspecified atrial fibrillation: Secondary | ICD-10-CM | POA: Diagnosis not present

## 2012-02-18 DIAGNOSIS — K219 Gastro-esophageal reflux disease without esophagitis: Secondary | ICD-10-CM | POA: Diagnosis not present

## 2012-02-18 DIAGNOSIS — K589 Irritable bowel syndrome without diarrhea: Secondary | ICD-10-CM | POA: Diagnosis not present

## 2012-02-18 DIAGNOSIS — I2510 Atherosclerotic heart disease of native coronary artery without angina pectoris: Secondary | ICD-10-CM | POA: Diagnosis not present

## 2012-02-18 DIAGNOSIS — E789 Disorder of lipoprotein metabolism, unspecified: Secondary | ICD-10-CM | POA: Diagnosis not present

## 2012-02-18 DIAGNOSIS — Z48815 Encounter for surgical aftercare following surgery on the digestive system: Secondary | ICD-10-CM | POA: Diagnosis not present

## 2012-02-20 DIAGNOSIS — Z48815 Encounter for surgical aftercare following surgery on the digestive system: Secondary | ICD-10-CM | POA: Diagnosis not present

## 2012-02-20 DIAGNOSIS — K589 Irritable bowel syndrome without diarrhea: Secondary | ICD-10-CM | POA: Diagnosis not present

## 2012-02-20 DIAGNOSIS — I2510 Atherosclerotic heart disease of native coronary artery without angina pectoris: Secondary | ICD-10-CM | POA: Diagnosis not present

## 2012-02-20 DIAGNOSIS — E789 Disorder of lipoprotein metabolism, unspecified: Secondary | ICD-10-CM | POA: Diagnosis not present

## 2012-02-20 DIAGNOSIS — I4891 Unspecified atrial fibrillation: Secondary | ICD-10-CM | POA: Diagnosis not present

## 2012-02-20 DIAGNOSIS — K219 Gastro-esophageal reflux disease without esophagitis: Secondary | ICD-10-CM | POA: Diagnosis not present

## 2012-02-23 ENCOUNTER — Telehealth: Payer: Self-pay | Admitting: Internal Medicine

## 2012-02-23 DIAGNOSIS — K589 Irritable bowel syndrome without diarrhea: Secondary | ICD-10-CM | POA: Diagnosis not present

## 2012-02-23 DIAGNOSIS — I4891 Unspecified atrial fibrillation: Secondary | ICD-10-CM | POA: Diagnosis not present

## 2012-02-23 DIAGNOSIS — E789 Disorder of lipoprotein metabolism, unspecified: Secondary | ICD-10-CM | POA: Diagnosis not present

## 2012-02-23 DIAGNOSIS — K219 Gastro-esophageal reflux disease without esophagitis: Secondary | ICD-10-CM | POA: Diagnosis not present

## 2012-02-23 DIAGNOSIS — I2510 Atherosclerotic heart disease of native coronary artery without angina pectoris: Secondary | ICD-10-CM | POA: Diagnosis not present

## 2012-02-23 DIAGNOSIS — Z48815 Encounter for surgical aftercare following surgery on the digestive system: Secondary | ICD-10-CM | POA: Diagnosis not present

## 2012-02-23 NOTE — Telephone Encounter (Signed)
Sounds good. Continue current dose coumadin. Repeat INR 1 month.

## 2012-02-23 NOTE — Telephone Encounter (Signed)
Nurse from [facility] says the PT was 32.4 and INR 2.7

## 2012-02-23 NOTE — Telephone Encounter (Signed)
Patient advised as instructed via telephone. 

## 2012-02-25 DIAGNOSIS — R609 Edema, unspecified: Secondary | ICD-10-CM | POA: Diagnosis not present

## 2012-02-25 DIAGNOSIS — R61 Generalized hyperhidrosis: Secondary | ICD-10-CM | POA: Diagnosis not present

## 2012-02-25 DIAGNOSIS — I10 Essential (primary) hypertension: Secondary | ICD-10-CM | POA: Diagnosis not present

## 2012-02-25 DIAGNOSIS — I4891 Unspecified atrial fibrillation: Secondary | ICD-10-CM | POA: Diagnosis not present

## 2012-02-26 DIAGNOSIS — K219 Gastro-esophageal reflux disease without esophagitis: Secondary | ICD-10-CM | POA: Diagnosis not present

## 2012-02-26 DIAGNOSIS — K589 Irritable bowel syndrome without diarrhea: Secondary | ICD-10-CM | POA: Diagnosis not present

## 2012-02-26 DIAGNOSIS — Z48815 Encounter for surgical aftercare following surgery on the digestive system: Secondary | ICD-10-CM | POA: Diagnosis not present

## 2012-02-26 DIAGNOSIS — I2510 Atherosclerotic heart disease of native coronary artery without angina pectoris: Secondary | ICD-10-CM | POA: Diagnosis not present

## 2012-02-26 DIAGNOSIS — E789 Disorder of lipoprotein metabolism, unspecified: Secondary | ICD-10-CM | POA: Diagnosis not present

## 2012-02-26 DIAGNOSIS — I4891 Unspecified atrial fibrillation: Secondary | ICD-10-CM | POA: Diagnosis not present

## 2012-02-27 DIAGNOSIS — K589 Irritable bowel syndrome without diarrhea: Secondary | ICD-10-CM | POA: Diagnosis not present

## 2012-02-27 DIAGNOSIS — I2510 Atherosclerotic heart disease of native coronary artery without angina pectoris: Secondary | ICD-10-CM | POA: Diagnosis not present

## 2012-02-27 DIAGNOSIS — E789 Disorder of lipoprotein metabolism, unspecified: Secondary | ICD-10-CM | POA: Diagnosis not present

## 2012-02-27 DIAGNOSIS — Z48815 Encounter for surgical aftercare following surgery on the digestive system: Secondary | ICD-10-CM | POA: Diagnosis not present

## 2012-02-27 DIAGNOSIS — I4891 Unspecified atrial fibrillation: Secondary | ICD-10-CM | POA: Diagnosis not present

## 2012-02-27 DIAGNOSIS — K219 Gastro-esophageal reflux disease without esophagitis: Secondary | ICD-10-CM | POA: Diagnosis not present

## 2012-03-01 DIAGNOSIS — K219 Gastro-esophageal reflux disease without esophagitis: Secondary | ICD-10-CM | POA: Diagnosis not present

## 2012-03-01 DIAGNOSIS — I4891 Unspecified atrial fibrillation: Secondary | ICD-10-CM | POA: Diagnosis not present

## 2012-03-01 DIAGNOSIS — K589 Irritable bowel syndrome without diarrhea: Secondary | ICD-10-CM | POA: Diagnosis not present

## 2012-03-01 DIAGNOSIS — I2510 Atherosclerotic heart disease of native coronary artery without angina pectoris: Secondary | ICD-10-CM | POA: Diagnosis not present

## 2012-03-01 DIAGNOSIS — Z48815 Encounter for surgical aftercare following surgery on the digestive system: Secondary | ICD-10-CM | POA: Diagnosis not present

## 2012-03-01 DIAGNOSIS — E789 Disorder of lipoprotein metabolism, unspecified: Secondary | ICD-10-CM | POA: Diagnosis not present

## 2012-03-03 DIAGNOSIS — Z48815 Encounter for surgical aftercare following surgery on the digestive system: Secondary | ICD-10-CM | POA: Diagnosis not present

## 2012-03-03 DIAGNOSIS — I2510 Atherosclerotic heart disease of native coronary artery without angina pectoris: Secondary | ICD-10-CM | POA: Diagnosis not present

## 2012-03-03 DIAGNOSIS — E789 Disorder of lipoprotein metabolism, unspecified: Secondary | ICD-10-CM | POA: Diagnosis not present

## 2012-03-03 DIAGNOSIS — K589 Irritable bowel syndrome without diarrhea: Secondary | ICD-10-CM | POA: Diagnosis not present

## 2012-03-03 DIAGNOSIS — K219 Gastro-esophageal reflux disease without esophagitis: Secondary | ICD-10-CM | POA: Diagnosis not present

## 2012-03-03 DIAGNOSIS — I4891 Unspecified atrial fibrillation: Secondary | ICD-10-CM | POA: Diagnosis not present

## 2012-03-03 DIAGNOSIS — Z48814 Encounter for surgical aftercare following surgery on the teeth or oral cavity: Secondary | ICD-10-CM

## 2012-03-04 ENCOUNTER — Ambulatory Visit: Payer: Medicare Other | Admitting: Internal Medicine

## 2012-03-08 ENCOUNTER — Encounter: Payer: Self-pay | Admitting: Internal Medicine

## 2012-03-08 ENCOUNTER — Ambulatory Visit (INDEPENDENT_AMBULATORY_CARE_PROVIDER_SITE_OTHER): Payer: Medicare Other | Admitting: Internal Medicine

## 2012-03-08 VITALS — BP 130/72 | HR 68 | Temp 98.3°F | Ht 62.0 in | Wt 128.8 lb

## 2012-03-08 DIAGNOSIS — R5381 Other malaise: Secondary | ICD-10-CM

## 2012-03-08 DIAGNOSIS — L89109 Pressure ulcer of unspecified part of back, unspecified stage: Secondary | ICD-10-CM

## 2012-03-08 DIAGNOSIS — Z48815 Encounter for surgical aftercare following surgery on the digestive system: Secondary | ICD-10-CM | POA: Diagnosis not present

## 2012-03-08 DIAGNOSIS — E789 Disorder of lipoprotein metabolism, unspecified: Secondary | ICD-10-CM | POA: Diagnosis not present

## 2012-03-08 DIAGNOSIS — Z9049 Acquired absence of other specified parts of digestive tract: Secondary | ICD-10-CM

## 2012-03-08 DIAGNOSIS — I4891 Unspecified atrial fibrillation: Secondary | ICD-10-CM | POA: Diagnosis not present

## 2012-03-08 DIAGNOSIS — R5383 Other fatigue: Secondary | ICD-10-CM

## 2012-03-08 DIAGNOSIS — Z7901 Long term (current) use of anticoagulants: Secondary | ICD-10-CM | POA: Diagnosis not present

## 2012-03-08 DIAGNOSIS — L8991 Pressure ulcer of unspecified site, stage 1: Secondary | ICD-10-CM

## 2012-03-08 DIAGNOSIS — I2510 Atherosclerotic heart disease of native coronary artery without angina pectoris: Secondary | ICD-10-CM | POA: Diagnosis not present

## 2012-03-08 DIAGNOSIS — M1990 Unspecified osteoarthritis, unspecified site: Secondary | ICD-10-CM | POA: Insufficient documentation

## 2012-03-08 DIAGNOSIS — L89151 Pressure ulcer of sacral region, stage 1: Secondary | ICD-10-CM | POA: Insufficient documentation

## 2012-03-08 DIAGNOSIS — K589 Irritable bowel syndrome without diarrhea: Secondary | ICD-10-CM | POA: Diagnosis not present

## 2012-03-08 DIAGNOSIS — K219 Gastro-esophageal reflux disease without esophagitis: Secondary | ICD-10-CM | POA: Diagnosis not present

## 2012-03-08 DIAGNOSIS — Z9889 Other specified postprocedural states: Secondary | ICD-10-CM

## 2012-03-08 DIAGNOSIS — Z1239 Encounter for other screening for malignant neoplasm of breast: Secondary | ICD-10-CM | POA: Insufficient documentation

## 2012-03-08 MED ORDER — CYANOCOBALAMIN 1000 MCG/ML IJ SOLN: 1000.0000 ug | INTRAMUSCULAR | Status: DC

## 2012-03-08 MED ORDER — A & D ZINC OXIDE EX CREA: 1.0000 "application " | TOPICAL_CREAM | Freq: Two times a day (BID) | CUTANEOUS | Status: DC | PRN

## 2012-03-08 MED ORDER — HYDROCODONE-ACETAMINOPHEN 5-325 MG PO TABS: 1.0000 | ORAL_TABLET | Freq: Three times a day (TID) | ORAL | Status: DC | PRN

## 2012-03-08 MED ORDER — NYSTATIN 100000 UNIT/GM EX POWD: Freq: Four times a day (QID) | CUTANEOUS | Status: DC

## 2012-03-08 MED ORDER — DILTIAZEM HCL ER COATED BEADS 120 MG PO CP24: 120.0000 mg | ORAL_CAPSULE | Freq: Every day | ORAL | Status: DC

## 2012-03-08 NOTE — Assessment & Plan Note (Signed)
Symptoms of osteoarthritis in the cervical and lumbar spine recently worsening with decreased physical activity. Patient will resume exercises that she learned with physical therapy. If symptoms are not improving, we discussed setting up another referral to physical therapy. Followup 4 weeks.

## 2012-03-08 NOTE — Assessment & Plan Note (Signed)
Doing very well after colectomy. Tolerating full diet. Occasionally using Imodium to help with loose bowel movements. Will continue to monitor.

## 2012-03-08 NOTE — Assessment & Plan Note (Signed)
Will check INR with labs today. Goal INR between 2 and 3. 

## 2012-03-08 NOTE — Progress Notes (Signed)
Subjective:    Patient ID: Kara [name], female    DOB: 1922-06-10, 78 y.o.   MRN: [id number]  HPI 78 year old female with history of atrial fibrillation, hypertension, congestive heart failure, recent episode of colitis status post bowel resection, and osteoarthritis presents for followup. She reports she is generally doing well. Energy level is improving. She is tolerating a full diet. She occasionally has some watery, nonbloody loose stools for which she uses Imodium periodically. She denies any constipation. She denies abdominal pain. She denies nausea or vomiting.  In regards to osteoarthritis, she notes that after recent hospitalization she had worsening symptoms of pain in her upper and lower back. She occasionally takes Tylenol or Vicodin for this. Pain is described as aching that radiates down her right shoulder and down the back of her right leg. She also underwent physical therapy in the past with some improvement but has not been recently participating in her exercises. Next  She is also concerned about an area of tenderness on her lower spine, upper buttock. This is been present since her prolonged hospitalization. She denies any open wounds. Next  In regards to atrial fibrillation, she denies any palpitations, chest pain, shortness of breath. She notes that her cardiologist recently decreased her diltiazem to 120 mg daily. She also notes that her Maxzide was stopped and she was changed to Aldactone. She is unsure if she needs to stay on her potassium supplement.  Outpatient Encounter Prescriptions as of 03/08/2012  Medication Sig Dispense Refill  . ALPRAZolam (XANAX) 0.25 MG tablet Take 0.25 mg by mouth every 8 (eight) hours as needed.      . Calcium Carbonate-Vitamin D (CALCIUM-D) 600-400 MG-UNIT TABS Take 1 tablet by mouth daily.      . cholecalciferol (VITAMIN D) 1000 UNITS tablet Take 1,000 Units by mouth daily.      . cholestyramine (QUESTRAN) 4 G packet Take 1 packet by mouth  daily.      . cyanocobalamin (,VITAMIN B-12,) 1000 MCG/ML injection Inject 1 mL (1,000 mcg total) into the muscle every 30 (thirty) days.  10 mL  1  . diltiazem (CARDIZEM CD) 120 MG 24 hr capsule Take 1 capsule (120 mg total) by mouth daily.  30 capsule  3  . Hypromellose (GENTEAL) 0.3 % SOLN Apply 1 drop to eye daily.      Marland Kitchen lisinopril (PRINIVIL,ZESTRIL) 5 MG tablet Take 5 mg by mouth daily.      Marland Kitchen loperamide (IMODIUM) 2 MG capsule Take 2 mg by mouth every 8 (eight) hours as needed.      . metoprolol (LOPRESSOR) 50 MG tablet Take 50 mg by mouth daily.      Marland Kitchen nystatin-triamcinolone ointment (MYCOLOG) Apply topically 2 (two) times daily.  30 g  0  . pantoprazole (PROTONIX) 20 MG tablet Take 20 mg by mouth daily.      . potassium chloride (MICRO-K) 10 MEQ CR capsule Take 10 mEq by mouth 2 (two) times daily.      . traMADol (ULTRAM) 50 MG tablet Take 50 mg by mouth every 6 (six) hours as needed.      . warfarin (COUMADIN) 1 MG tablet Take 1.5mg  on Monday, Wednesday, and Friday      . warfarin (COUMADIN) 2 MG tablet Take 2 mg by mouth daily. 2 mg 4 days a week and 1 mg 3 days a week      . zolpidem (AMBIEN) 5 MG tablet Take 1 tablet (5 mg total) by mouth at bedtime  as needed for sleep.  30 tablet  0  . DISCONTD: acetaZOLAMIDE (DIAMOX) 250 MG tablet Take 250 mg by mouth 4 (four) times daily.      Marland Kitchen DISCONTD: cyanocobalamin (,VITAMIN B-12,) 1000 MCG/ML injection Inject 1 mL (1,000 mcg total) into the muscle every 30 (thirty) days.  10 mL  1  . DISCONTD: diltiazem (CARDIZEM CD) 180 MG 24 hr capsule Take 180 mg by mouth daily.      Marland Kitchen DISCONTD: triamterene-hydrochlorothiazide (MAXZIDE-25) 37.5-25 MG per tablet Take 1 tablet by mouth daily.      . Dimethicone-Zinc Oxide-Vit A-D (A & D ZINC OXIDE) CREA Apply 1 application topically 2 (two) times daily as needed.  113 g  2  . HYDROcodone-acetaminophen (NORCO/VICODIN) 5-325 MG per tablet Take 1 tablet by mouth every 8 (eight) hours as needed.  90 tablet  1  .  nystatin (MYCOSTATIN) powder Apply topically 4 (four) times daily.  15 g  0  . potassium chloride SA (K-DUR,KLOR-CON) 20 MEQ tablet       . spironolactone (ALDACTONE) 25 MG tablet        BP 130/72  Pulse 68  Temp 98.3 F (36.8 C) (Oral)  Ht 5\' 2"  (1.575 m)  Wt 128 lb 12 oz (58.401 kg)  BMI 23.55 kg/m2  SpO2 96%  Review of Systems  Constitutional: Negative for fever, chills, appetite change, fatigue and unexpected weight change.  HENT: Negative for ear pain, congestion, sore throat, trouble swallowing, neck pain, voice change and sinus pressure.   Eyes: Negative for visual disturbance.  Respiratory: Negative for cough, shortness of breath, wheezing and stridor.   Cardiovascular: Negative for chest pain, palpitations and leg swelling.  Gastrointestinal: Negative for nausea, vomiting, abdominal pain, diarrhea, constipation, blood in stool, abdominal distention and anal bleeding.  Genitourinary: Negative for dysuria and flank pain.  Musculoskeletal: Positive for myalgias, back pain and arthralgias. Negative for gait problem.  Skin: Positive for color change. Negative for rash.  Neurological: Negative for dizziness and headaches.  Hematological: Negative for adenopathy. Does not bruise/bleed easily.  Psychiatric/Behavioral: Negative for suicidal ideas, disturbed wake/sleep cycle and dysphoric mood. The patient is not nervous/anxious.        Objective:   Physical Exam  Constitutional: She is oriented to person, place, and time. She appears well-developed and well-nourished. No distress.  HENT:  Head: Normocephalic and atraumatic.  Right Ear: External ear normal.  Left Ear: External ear normal.  Nose: Nose normal.  Mouth/Throat: Oropharynx is clear and moist. No oropharyngeal exudate.  Eyes: Conjunctivae normal are normal. Pupils are equal, round, and reactive to light. Right eye exhibits no discharge. Left eye exhibits no discharge. No scleral icterus.  Neck: Normal range of motion.  Neck supple. No tracheal deviation present. No thyromegaly present.  Cardiovascular: Normal rate, regular rhythm, normal heart sounds and intact distal pulses.  Exam reveals no gallop and no friction rub.   No murmur heard. Pulmonary/Chest: Effort normal and breath sounds normal. No respiratory distress. She has no wheezes. She has no rales. She exhibits no tenderness.  Abdominal: Soft. Normal appearance and bowel sounds are normal. There is no tenderness.    Musculoskeletal: Normal range of motion. She exhibits no edema and no tenderness.  Lymphadenopathy:    She has no cervical adenopathy.  Neurological: She is alert and oriented to person, place, and time. No cranial nerve deficit. She exhibits normal muscle tone. Coordination normal.  Skin: Skin is warm and dry. No rash noted. She is not diaphoretic.  There is erythema. No pallor.     Psychiatric: She has a normal mood and affect. Her behavior is normal. Judgment and thought content normal.          Assessment & Plan:  Over spent in face to face contact with patient discussing plan of care.

## 2012-03-08 NOTE — Assessment & Plan Note (Signed)
Rate well-controlled with current medications. On chronic anticoagulation with Coumadin. Doing well. Followup in 4 weeks.

## 2012-03-08 NOTE — Assessment & Plan Note (Signed)
Erythema over the sacral area consistent with early sacral decubitus ulcer. We discussed avoiding pressure to the area and patient will apply topical zinc oxide to protect overlying skin. We also discussed using nystatin powder between the buttocks to help prevent yeast infection. Patient will followup in 4 weeks. She will call sooner if symptoms are worsening.

## 2012-03-08 NOTE — Assessment & Plan Note (Signed)
Symptoms are gradually improving as patient recovers from recent hospitalization and bowel resection. Will continue to monitor. Followup in 4 weeks.

## 2012-03-09 LAB — COMPREHENSIVE METABOLIC PANEL
ALT: 20 U/L (ref 0–35)
AST: 23 U/L (ref 0–37)
Albumin: 3.4 g/dL — ABNORMAL LOW (ref 3.5–5.2)
Alkaline Phosphatase: 91 U/L (ref 39–117)
BUN: 21 mg/dL (ref 6–23)
CO2: 26 mEq/L (ref 19–32)
Calcium: 9.1 mg/dL (ref 8.4–10.5)
Chloride: 103 mEq/L (ref 96–112)
Creatinine, Ser: 1.2 mg/dL (ref 0.4–1.2)
GFR: 47.39 mL/min — ABNORMAL LOW (ref >60.00)
Glucose, Bld: 118 mg/dL — ABNORMAL HIGH (ref 70–99)
Potassium: 4.4 mEq/L (ref 3.5–5.1)
Sodium: 137 mEq/L (ref 135–145)
Total Bilirubin: 0.6 mg/dL (ref 0.3–1.2)
Total Protein: 6.8 g/dL (ref 6.0–8.3)

## 2012-03-09 LAB — CBC WITH DIFFERENTIAL/PLATELET
Basophils Absolute: 0 10*3/uL (ref 0.0–0.1)
Basophils Relative: 0.7 % (ref 0.0–3.0)
Eosinophils Absolute: 0.1 10*3/uL (ref 0.0–0.7)
Eosinophils Relative: 1.1 % (ref 0.0–5.0)
HCT: 40.7 % (ref 36.0–46.0)
Hemoglobin: 13.1 g/dL (ref 12.0–15.0)
Lymphocytes Relative: 43.8 % (ref 12.0–46.0)
Lymphs Abs: 3 10*3/uL (ref 0.7–4.0)
MCHC: 32.1 g/dL (ref 30.0–36.0)
MCV: 88.7 fl (ref 78.0–100.0)
Monocytes Absolute: 0.3 10*3/uL (ref 0.1–1.0)
Monocytes Relative: 4.9 % (ref 3.0–12.0)
Neutro Abs: 3.4 10*3/uL (ref 1.4–7.7)
Neutrophils Relative %: 49.5 % (ref 43.0–77.0)
Platelets: 213 10*3/uL (ref 150.0–400.0)
RBC: 4.58 Mil/uL (ref 3.87–5.11)
RDW: 13.3 % (ref 11.5–14.6)
WBC: 6.9 10*3/uL (ref 4.5–10.5)

## 2012-03-09 LAB — PROTIME-INR
INR: 4.2 ratio — ABNORMAL HIGH (ref 0.8–1.0)
Prothrombin Time: 43 s — ABNORMAL HIGH (ref 10.2–12.4)

## 2012-03-10 ENCOUNTER — Telehealth: Payer: Self-pay | Admitting: Internal Medicine

## 2012-03-10 NOTE — Telephone Encounter (Signed)
Addressed under result note from 03/08/2012 regarding PT/INR.

## 2012-03-10 NOTE — Telephone Encounter (Signed)
Pt is returning a call to [facility] for results.

## 2012-03-11 ENCOUNTER — Telehealth: Payer: Self-pay | Admitting: Internal Medicine

## 2012-03-11 DIAGNOSIS — E789 Disorder of lipoprotein metabolism, unspecified: Secondary | ICD-10-CM | POA: Diagnosis not present

## 2012-03-11 DIAGNOSIS — K219 Gastro-esophageal reflux disease without esophagitis: Secondary | ICD-10-CM | POA: Diagnosis not present

## 2012-03-11 DIAGNOSIS — Z48815 Encounter for surgical aftercare following surgery on the digestive system: Secondary | ICD-10-CM | POA: Diagnosis not present

## 2012-03-11 DIAGNOSIS — I4891 Unspecified atrial fibrillation: Secondary | ICD-10-CM | POA: Diagnosis not present

## 2012-03-11 DIAGNOSIS — K589 Irritable bowel syndrome without diarrhea: Secondary | ICD-10-CM | POA: Diagnosis not present

## 2012-03-11 DIAGNOSIS — I2510 Atherosclerotic heart disease of native coronary artery without angina pectoris: Secondary | ICD-10-CM | POA: Diagnosis not present

## 2012-03-11 NOTE — Telephone Encounter (Signed)
Pt was calling about her PT/INR she was wondering what she should be taking now. Lowella Bandy called her yesterday and told her stop what she was taking and wait to here what she needs to start taking. And She was wondering about her Potassium as well. They were going to tell her how much she will need to to start taking. She says that if someone calls her and she can't pick up to please leave a detail message on her machine.

## 2012-03-12 ENCOUNTER — Telehealth: Payer: Self-pay | Admitting: *Deleted

## 2012-03-12 LAB — VITAMIN B12: Vitamin B-12: 283 pg/mL (ref 211–911)

## 2012-03-12 LAB — TSH: TSH: 2.36 u[IU]/mL (ref 0.35–5.50)

## 2012-03-12 MED ORDER — WARFARIN SODIUM 1 MG PO TABS: ORAL_TABLET | ORAL | Status: DC

## 2012-03-12 NOTE — Telephone Encounter (Signed)
    Notes Recorded by Shelia Media, MD on 03/12/2012 at 8:57 AM This may have already been addressed as I have been out of town. Pt should lower to 1.5mg  coumadin daily. Repeat INR this week if possible or Monday. We don't have INR testing by fingerstick at our clinic, but can we set her up with the home health company that does this?

## 2012-03-12 NOTE — Telephone Encounter (Signed)
Pt called to find out what dosage of comdian she is to take.  Kara [name] told her to hold med Tuesday night.  Pt has not been taking her meds since Tuesday evening Pt is upset that  on one has called her back. About med dosage Pt took  2mg  daily Pt only has a few 2 mg left [employer]  pt also wanted to know dosage of potassium Pt has to get driver to take her to pharmacy They wanted to go today before 12

## 2012-03-12 NOTE — Telephone Encounter (Signed)
Pt stated you can leave detailed message about her meds on the answering machine if she does not answer

## 2012-03-12 NOTE — Telephone Encounter (Signed)
Opened in error

## 2012-03-12 NOTE — Telephone Encounter (Signed)
Called Kara [name] informed her that Dr. Dan Humphreys wants her to take Coumadin 1.5mg  po daily... She needs a script sent to the pharmacy (has no 1mg  tablets). Scheduled for repeat Protime on Monday. Script sent as directed.

## 2012-03-15 ENCOUNTER — Other Ambulatory Visit (INDEPENDENT_AMBULATORY_CARE_PROVIDER_SITE_OTHER): Payer: Medicare Other

## 2012-03-15 ENCOUNTER — Telehealth: Payer: Self-pay

## 2012-03-15 DIAGNOSIS — Z7901 Long term (current) use of anticoagulants: Secondary | ICD-10-CM | POA: Diagnosis not present

## 2012-03-15 LAB — PROTIME-INR
INR: 2.4 ratio — ABNORMAL HIGH (ref 0.8–1.0)
Prothrombin Time: 25.2 s — ABNORMAL HIGH (ref 10.2–12.4)

## 2012-03-15 NOTE — Telephone Encounter (Signed)
Patient requests a call to discuss Coumadin therapy and future appointments.

## 2012-03-15 NOTE — Telephone Encounter (Signed)
Left message on machine at home for patient to return call. 

## 2012-03-15 NOTE — Telephone Encounter (Signed)
Nikki - Can you figure out what she needs?

## 2012-03-16 ENCOUNTER — Telehealth: Payer: Self-pay | Admitting: Internal Medicine

## 2012-03-16 NOTE — Telephone Encounter (Signed)
This is being addressed under result note.

## 2012-03-16 NOTE — Telephone Encounter (Signed)
Spironolactone 25 mg tab Sig: take 1 tablet daily 90 day qty.

## 2012-03-16 NOTE — Telephone Encounter (Signed)
Refill request for metoprolol 50 mg tab Sig: take 1 tablet twice daily for hypertension Qty: 90 day

## 2012-03-16 NOTE — Telephone Encounter (Signed)
Received another fax saying * patient not sure if she is on spironolactone or Maxzide please verify and let the pharmacy know.*

## 2012-03-16 NOTE — Telephone Encounter (Signed)
This is being addressed under result note. 

## 2012-03-18 ENCOUNTER — Other Ambulatory Visit: Payer: Self-pay | Admitting: Internal Medicine

## 2012-03-18 NOTE — Telephone Encounter (Signed)
Pt called stating she is still having problem with getting her medication [facility] dileiazem metoprlol Number 10 meq potassium

## 2012-03-19 ENCOUNTER — Telehealth: Payer: Self-pay | Admitting: Internal Medicine

## 2012-03-19 MED ORDER — DILTIAZEM HCL ER COATED BEADS 120 MG PO CP24: 120.0000 mg | ORAL_CAPSULE | Freq: Every day | ORAL | Status: DC

## 2012-03-19 MED ORDER — METOPROLOL TARTRATE 50 MG PO TABS: 50.0000 mg | ORAL_TABLET | Freq: Two times a day (BID) | ORAL | Status: DC

## 2012-03-19 MED ORDER — POTASSIUM CHLORIDE ER 10 MEQ PO CPCR: 10.0000 meq | ORAL_CAPSULE | Freq: Two times a day (BID) | ORAL | Status: DC

## 2012-03-19 NOTE — Telephone Encounter (Signed)
Addressed in previous phone note.  

## 2012-03-19 NOTE — Telephone Encounter (Signed)
Pt called checking on her refill mryoptolol  [facility] fax request over 1 week   Pt only has 1 pill left  Refill potsium #[id number]

## 2012-03-19 NOTE — Telephone Encounter (Signed)
All three Rx's sent to [facility] pharmacy.

## 2012-03-22 ENCOUNTER — Other Ambulatory Visit (INDEPENDENT_AMBULATORY_CARE_PROVIDER_SITE_OTHER): Payer: Medicare Other

## 2012-03-22 DIAGNOSIS — Z7901 Long term (current) use of anticoagulants: Secondary | ICD-10-CM | POA: Diagnosis not present

## 2012-03-24 ENCOUNTER — Other Ambulatory Visit: Payer: Self-pay | Admitting: Internal Medicine

## 2012-03-24 ENCOUNTER — Ambulatory Visit: Payer: Self-pay | Admitting: Internal Medicine

## 2012-03-24 DIAGNOSIS — Z1231 Encounter for screening mammogram for malignant neoplasm of breast: Secondary | ICD-10-CM | POA: Diagnosis not present

## 2012-03-24 DIAGNOSIS — Z7901 Long term (current) use of anticoagulants: Secondary | ICD-10-CM | POA: Diagnosis not present

## 2012-03-25 ENCOUNTER — Encounter: Payer: Self-pay | Admitting: *Deleted

## 2012-03-26 LAB — PROTIME-INR
INR: 2.3 — ABNORMAL HIGH (ref 0.8–1.2)
Prothrombin Time: 24.3 s — ABNORMAL HIGH (ref 9.1–12.0)

## 2012-03-29 ENCOUNTER — Encounter: Payer: Self-pay | Admitting: Internal Medicine

## 2012-03-31 DIAGNOSIS — Z7901 Long term (current) use of anticoagulants: Secondary | ICD-10-CM | POA: Diagnosis not present

## 2012-03-31 DIAGNOSIS — I10 Essential (primary) hypertension: Secondary | ICD-10-CM | POA: Diagnosis not present

## 2012-03-31 DIAGNOSIS — I4891 Unspecified atrial fibrillation: Secondary | ICD-10-CM | POA: Diagnosis not present

## 2012-04-02 DIAGNOSIS — R197 Diarrhea, unspecified: Secondary | ICD-10-CM | POA: Diagnosis not present

## 2012-04-06 ENCOUNTER — Telehealth: Payer: Self-pay | Admitting: Internal Medicine

## 2012-04-06 DIAGNOSIS — I4891 Unspecified atrial fibrillation: Secondary | ICD-10-CM | POA: Diagnosis not present

## 2012-04-06 LAB — PROTIME-INR: INR: 3.1 — AB (ref 0.9–1.1)

## 2012-04-06 NOTE — Telephone Encounter (Signed)
Coumadin level as checked by [facility] was 3.1.  I would like for her to check again in 1 week, as this is on upper limit of goal range.

## 2012-04-08 LAB — HM MAMMOGRAPHY: HM Mammogram: NORMAL

## 2012-04-08 NOTE — Telephone Encounter (Signed)
Called patient to schedule lab appointment and line was busy. Will call this afternoon.

## 2012-04-09 NOTE — Telephone Encounter (Signed)
Patient will check level as instructed by Dr. Julianne Rice appointment is needed

## 2012-04-10 ENCOUNTER — Other Ambulatory Visit: Payer: Self-pay | Admitting: Internal Medicine

## 2012-04-13 DIAGNOSIS — R197 Diarrhea, unspecified: Secondary | ICD-10-CM | POA: Diagnosis not present

## 2012-04-13 DIAGNOSIS — K520 Gastroenteritis and colitis due to radiation: Secondary | ICD-10-CM | POA: Diagnosis not present

## 2012-04-14 ENCOUNTER — Encounter: Payer: Self-pay | Admitting: Internal Medicine

## 2012-04-14 ENCOUNTER — Ambulatory Visit (INDEPENDENT_AMBULATORY_CARE_PROVIDER_SITE_OTHER): Payer: Medicare Other | Admitting: Internal Medicine

## 2012-04-14 VITALS — BP 130/70 | HR 68 | Temp 97.5°F | Resp 16 | Wt 128.0 lb

## 2012-04-14 DIAGNOSIS — R197 Diarrhea, unspecified: Secondary | ICD-10-CM

## 2012-04-14 DIAGNOSIS — K529 Noninfective gastroenteritis and colitis, unspecified: Secondary | ICD-10-CM

## 2012-04-14 DIAGNOSIS — I4891 Unspecified atrial fibrillation: Secondary | ICD-10-CM | POA: Diagnosis not present

## 2012-04-14 DIAGNOSIS — I10 Essential (primary) hypertension: Secondary | ICD-10-CM | POA: Diagnosis not present

## 2012-04-14 DIAGNOSIS — Z7901 Long term (current) use of anticoagulants: Secondary | ICD-10-CM

## 2012-04-14 MED ORDER — WARFARIN SODIUM 1 MG PO TABS: 1.5000 mg | ORAL_TABLET | Freq: Every day | ORAL | Status: DC

## 2012-04-14 NOTE — Assessment & Plan Note (Signed)
On chronic anticoagulation for atrial fibrillation. Having some difficulty monitoring INR at home using [facility] NR. Will set up another home health evaluation to assist her in learning how to use this equipment. Will check INR today at [facility].

## 2012-04-14 NOTE — Assessment & Plan Note (Signed)
Currently well-controlled on current medications. No changes made to medications today. Continue chronic anticoagulation with Coumadin.

## 2012-04-14 NOTE — Assessment & Plan Note (Signed)
Symptoms of diarrhea and bloating were initially worse after her recent colectomy. However, she was seen by GI and started on probiotics with some improvement in symptoms. We'll continue to monitor for now. Will request notes from GI physician.

## 2012-04-14 NOTE — Progress Notes (Signed)
Subjective:    Patient ID: Kara [name], female    DOB: Feb 18, 1923, 78 y.o.   MRN: [id number]  HPI 78 year old female with history of atrial fibrillation on chronic anticoagulation, recent episode of colitis status post colectomy, chronic diarrhea presents for followup. She reports in the interim since her last visit she has had some persistent symptoms of abdominal distention, gas, and diarrhea. She was ultimately seen by GI physician who started her on antibiotics and then a probiotic with some improvement in her symptoms. She is also taking Imodium up to twice daily with some improvement. She denies any blood in her stool or abdominal pain. She denies any fever or chills.  In regards to chronic anticoagulation, she was recently set up with home health service for monitoring of Coumadin level. She reports difficulty using the machine she was given and has been unable to monitor her Coumadin level. She denies any bruising or bleeding. In regards to atrial fibrillation, she denies any chest pain, shortness of breath. She reports full compliance with medications.  Outpatient Encounter Prescriptions as of 04/14/2012  Medication Sig Dispense Refill  . ALPRAZolam (XANAX) 0.25 MG tablet Take 0.25 mg by mouth every 8 (eight) hours as needed.      . Calcium Carbonate-Vitamin D (CALCIUM-D) 600-400 MG-UNIT TABS Take 1 tablet by mouth daily.      . cholecalciferol (VITAMIN D) 1000 UNITS tablet Take 1,000 Units by mouth daily.      . cholestyramine (QUESTRAN) 4 G packet Take 1 packet by mouth daily.      Marland Kitchen COUMADIN 1 MG tablet TAKE 1.5MG  ( 1 &1/2 TABLET) BY MOUTH DAILY AS DIRECTED  45 tablet  6  . cyanocobalamin (,VITAMIN B-12,) 1000 MCG/ML injection Inject 1 mL (1,000 mcg total) into the muscle every 30 (thirty) days.  10 mL  1  . diltiazem (CARDIZEM CD) 120 MG 24 hr capsule Take 1 capsule (120 mg total) by mouth daily.  30 capsule  6  . Dimethicone-Zinc Oxide-Vit A-D (A & D ZINC OXIDE) CREA Apply 1  application topically 2 (two) times daily as needed.  113 g  2  . HYDROcodone-acetaminophen (NORCO/VICODIN) 5-325 MG per tablet Take 1 tablet by mouth every 8 (eight) hours as needed.  90 tablet  1  . Hypromellose (GENTEAL) 0.3 % SOLN Apply 1 drop to eye daily.      Marland Kitchen lisinopril (PRINIVIL,ZESTRIL) 5 MG tablet Take 5 mg by mouth daily.      Marland Kitchen loperamide (IMODIUM) 2 MG capsule Take 2 mg by mouth every 8 (eight) hours as needed.      . metoprolol (LOPRESSOR) 50 MG tablet Take 1 tablet (50 mg total) by mouth 2 (two) times daily.  60 tablet  6  . nystatin (MYCOSTATIN) powder Apply topically 4 (four) times daily.  15 g  0  . nystatin-triamcinolone ointment (MYCOLOG) Apply topically 2 (two) times daily.  30 g  0  . pantoprazole (PROTONIX) 20 MG tablet Take 20 mg by mouth daily.      Marland Kitchen spironolactone (ALDACTONE) 25 MG tablet       . traMADol (ULTRAM) 50 MG tablet Take 50 mg by mouth every 6 (six) hours as needed.      . warfarin (COUMADIN) 1 MG tablet Take 1.5 tablets (1.5 mg total) by mouth daily. Take 1.5mg  on Monday, Wednesday, and Friday  45 tablet  0   BP 130/70  Pulse 68  Temp 97.5 F (36.4 C) (Oral)  Resp 16  Wt  128 lb (58.06 kg)  SpO2 93%  Review of Systems  Constitutional: Negative for fever, chills, appetite change, fatigue and unexpected weight change.  HENT: Negative for ear pain, congestion, sore throat, trouble swallowing, neck pain, voice change and sinus pressure.   Eyes: Negative for visual disturbance.  Respiratory: Negative for cough, shortness of breath, wheezing and stridor.   Cardiovascular: Negative for chest pain, palpitations and leg swelling.  Gastrointestinal: Positive for diarrhea and abdominal distention. Negative for nausea, vomiting, abdominal pain, constipation, blood in stool and anal bleeding.  Genitourinary: Negative for dysuria and flank pain.  Musculoskeletal: Negative for myalgias, arthralgias and gait problem.  Skin: Negative for color change and rash.    Neurological: Negative for dizziness and headaches.  Hematological: Negative for adenopathy. Does not bruise/bleed easily.  Psychiatric/Behavioral: Negative for suicidal ideas, sleep disturbance and dysphoric mood. The patient is not nervous/anxious.        Objective:   Physical Exam  Constitutional: She is oriented to person, place, and time. She appears well-developed and well-nourished. No distress.  HENT:  Head: Normocephalic and atraumatic.  Right Ear: External ear normal.  Left Ear: External ear normal.  Nose: Nose normal.  Mouth/Throat: Oropharynx is clear and moist. No oropharyngeal exudate.  Eyes: Conjunctivae normal are normal. Pupils are equal, round, and reactive to light. Right eye exhibits no discharge. Left eye exhibits no discharge. No scleral icterus.  Neck: Normal range of motion. Neck supple. No tracheal deviation present. No thyromegaly present.  Cardiovascular: Normal rate, regular rhythm, normal heart sounds and intact distal pulses.  Exam reveals no gallop and no friction rub.   No murmur heard. Pulmonary/Chest: Effort normal and breath sounds normal. No respiratory distress. She has no wheezes. She has no rales. She exhibits no tenderness.  Abdominal: Soft. Bowel sounds are normal. She exhibits no distension. There is no tenderness.  Musculoskeletal: Normal range of motion. She exhibits no edema and no tenderness.  Lymphadenopathy:    She has no cervical adenopathy.  Neurological: She is alert and oriented to person, place, and time. No cranial nerve deficit. She exhibits normal muscle tone. Coordination normal.  Skin: Skin is warm and dry. No rash noted. She is not diaphoretic. No erythema. No pallor.  Psychiatric: She has a normal mood and affect. Her behavior is normal. Judgment and thought content normal.          Assessment & Plan:

## 2012-04-19 ENCOUNTER — Telehealth: Payer: Self-pay | Admitting: Internal Medicine

## 2012-04-19 ENCOUNTER — Encounter: Payer: Self-pay | Admitting: Internal Medicine

## 2012-04-19 NOTE — Telephone Encounter (Signed)
INR 11/27 was perfect at 2.5. Continue coumadin same dose, repeat INR 1 month.

## 2012-04-20 LAB — PROTIME-INR: INR: 2.6 — AB (ref 0.9–1.1)

## 2012-04-20 NOTE — Telephone Encounter (Signed)
Pt.notified

## 2012-04-26 ENCOUNTER — Encounter: Payer: Self-pay | Admitting: Internal Medicine

## 2012-04-30 ENCOUNTER — Encounter: Payer: Self-pay | Admitting: Internal Medicine

## 2012-05-10 ENCOUNTER — Other Ambulatory Visit: Payer: Self-pay | Admitting: Internal Medicine

## 2012-05-10 ENCOUNTER — Other Ambulatory Visit: Payer: Self-pay | Admitting: General Practice

## 2012-05-10 DIAGNOSIS — Z7901 Long term (current) use of anticoagulants: Secondary | ICD-10-CM

## 2012-05-10 MED ORDER — WARFARIN SODIUM 1 MG PO TABS: 1.5000 mg | ORAL_TABLET | Freq: Every day | ORAL | Status: DC

## 2012-05-10 NOTE — Telephone Encounter (Signed)
Coumadin 2 mg tab  Take 1 tablet by mouth as directed   #30

## 2012-05-10 NOTE — Telephone Encounter (Signed)
Pt called to verify dosage.

## 2012-05-10 NOTE — Telephone Encounter (Signed)
Pt takes 1.5 coumadin. Verified that pt would like the 1mg  tablets called in instead.

## 2012-05-14 ENCOUNTER — Encounter: Payer: Self-pay | Admitting: Internal Medicine

## 2012-05-20 LAB — PROTIME-INR

## 2012-05-23 ENCOUNTER — Telehealth: Payer: Self-pay | Admitting: Internal Medicine

## 2012-05-23 NOTE — Telephone Encounter (Signed)
Coumadin level from 05/20/2012 was too high at 3.5.  Can you please discuss with pt and ask: 1. What dose of Coumadin is she taking? 2. Has she recently been on any antibiotics?

## 2012-05-24 NOTE — Telephone Encounter (Signed)
This is not consistent with what we have in our records.  Can we set up a follow up for her? Can we repeat INR this week, preferrably Wednesday

## 2012-05-24 NOTE — Telephone Encounter (Signed)
Patient advised as instructed via telephone, she stated that she is taking 1.5mg  of Coumadin daily, she has not been on any antibiotics and she may have missed a dose last week.  Please advise.

## 2012-05-25 DIAGNOSIS — I495 Sick sinus syndrome: Secondary | ICD-10-CM | POA: Diagnosis not present

## 2012-05-25 DIAGNOSIS — K591 Functional diarrhea: Secondary | ICD-10-CM | POA: Diagnosis not present

## 2012-05-25 NOTE — Telephone Encounter (Signed)
Spoke with patient and she stated that she rather not come in our office to have blood work done because we always have a hard time getting her blood and they have a hard time at [facility] as well.  She would rather check her PT/INR at home on Thursday as scheduled.  Follow up appt scheduled for 06/08/2012 at 10:15 to discuss coumadin.

## 2012-06-01 DIAGNOSIS — I4891 Unspecified atrial fibrillation: Secondary | ICD-10-CM | POA: Diagnosis not present

## 2012-06-01 LAB — PROTIME-INR: INR: 2.8 — AB (ref 0.9–1.1)

## 2012-06-03 DIAGNOSIS — D1801 Hemangioma of skin and subcutaneous tissue: Secondary | ICD-10-CM | POA: Diagnosis not present

## 2012-06-03 DIAGNOSIS — Z85828 Personal history of other malignant neoplasm of skin: Secondary | ICD-10-CM | POA: Diagnosis not present

## 2012-06-03 DIAGNOSIS — L8990 Pressure ulcer of unspecified site, unspecified stage: Secondary | ICD-10-CM | POA: Diagnosis not present

## 2012-06-07 ENCOUNTER — Encounter: Payer: Self-pay | Admitting: Internal Medicine

## 2012-06-08 ENCOUNTER — Encounter: Payer: Self-pay | Admitting: Internal Medicine

## 2012-06-08 ENCOUNTER — Ambulatory Visit (INDEPENDENT_AMBULATORY_CARE_PROVIDER_SITE_OTHER): Payer: Medicare Other | Admitting: Internal Medicine

## 2012-06-08 VITALS — BP 114/60 | HR 74 | Temp 97.9°F | Ht 62.0 in | Wt 126.0 lb

## 2012-06-08 DIAGNOSIS — R42 Dizziness and giddiness: Secondary | ICD-10-CM

## 2012-06-08 DIAGNOSIS — K649 Unspecified hemorrhoids: Secondary | ICD-10-CM | POA: Diagnosis not present

## 2012-06-08 LAB — PROTIME-INR

## 2012-06-08 MED ORDER — HYDROCORTISONE ACE-PRAMOXINE 2.5-1 % RE CREA: TOPICAL_CREAM | Freq: Two times a day (BID) | RECTAL | Status: DC

## 2012-06-08 NOTE — Assessment & Plan Note (Addendum)
One month h/o intermittent lightheadedness and unsteadiness on feet. No falls. However, gait instability noted on exam. Symptoms concerning for carotid stenosis with TIA and/or basilar insufficiency. Will set up carotid dopplers and CT head with contrast (unable to have MRI brain because of pacemaker). Follow up 2 weeks and prn.

## 2012-06-08 NOTE — Assessment & Plan Note (Signed)
Chronic. Non-bleeding. Will try topical Analpram to see if any improvement in symptoms of pain.

## 2012-06-08 NOTE — Progress Notes (Signed)
Subjective:    Patient ID: Kara [name], female    DOB: 1923-01-31, 79 y.o.   MRN: [id number]  HPI 79 year old female with history of atrial fibrillation on chronic anticoagulation, recent episode of colitis status post colectomy presents for followup. Her primary concern today is 2 month history of intermittent episodes of lightheadedness and gait instability. She reports she occasionally feels that she is being "pulled" to one side. She had one fall when she was bending forward to pick something up and fell forward landing on her side. She denies any injury at that time. Aside from that she has not had any falls. However, she reports occasional unsteadiness on her feet and occasional weakness. She denies any numbness change in her speech. Denies chest pain, palpitations. Denies any recent illnesses. Recent INR has been therapeutic.  In regards to chronic diarrhea, she reports symptoms have been gradually improving. She was recently released from her GI physicians care. She continues to have some issues with hemorrhoidal discomfort. She denies any bleeding from her hemorrhoids. She reports she is tolerating a full diet.  Outpatient Encounter Prescriptions as of 06/08/2012  Medication Sig Dispense Refill  . ALPRAZolam (XANAX) 0.25 MG tablet Take 0.25 mg by mouth every 8 (eight) hours as needed.      . cholecalciferol (VITAMIN D) 1000 UNITS tablet Take 1,000 Units by mouth daily.      . cyanocobalamin (,VITAMIN B-12,) 1000 MCG/ML injection Inject 1 mL (1,000 mcg total) into the muscle every 30 (thirty) days.  10 mL  1  . diltiazem (CARDIZEM CD) 120 MG 24 hr capsule Take 1 capsule (120 mg total) by mouth daily.  30 capsule  6  . Dimethicone-Zinc Oxide-Vit A-D (A & D ZINC OXIDE) CREA Apply 1 application topically 2 (two) times daily as needed.  113 g  2  . HYDROcodone-acetaminophen (NORCO/VICODIN) 5-325 MG per tablet Take 1 tablet by mouth every 8 (eight) hours as needed.  90 tablet  1  . Hypromellose  (GENTEAL) 0.3 % SOLN Apply 1 drop to eye daily.      Marland Kitchen loperamide (IMODIUM) 2 MG capsule Take 2 mg by mouth every 8 (eight) hours as needed.      . metoprolol (LOPRESSOR) 50 MG tablet Take 1 tablet (50 mg total) by mouth 2 (two) times daily.  60 tablet  6  . pantoprazole (PROTONIX) 20 MG tablet Take 20 mg by mouth daily.      Marland Kitchen spironolactone (ALDACTONE) 25 MG tablet       . warfarin (COUMADIN) 1 MG tablet Take 1.5 tablets (1.5 mg total) by mouth daily.  45 tablet  2  . Calcium Carbonate-Vitamin D (CALCIUM-D) 600-400 MG-UNIT TABS Take 1 tablet by mouth daily.      . hydrocortisone-pramoxine (ANALPRAM-HC) 2.5-1 % rectal cream Place rectally 2 (two) times daily.  30 g  6  . lisinopril (PRINIVIL,ZESTRIL) 5 MG tablet Take 5 mg by mouth daily.      Marland Kitchen nystatin (MYCOSTATIN) powder Apply topically 4 (four) times daily as needed.      . nystatin-triamcinolone ointment (MYCOLOG) Apply topically 2 (two) times daily.  30 g  0  . traMADol (ULTRAM) 50 MG tablet Take 50 mg by mouth every 6 (six) hours as needed.       BP 114/60  Pulse 74  Temp 97.9 F (36.6 C) (Oral)  Ht 5\' 2"  (1.575 m)  Wt 126 lb (57.153 kg)  BMI 23.05 kg/m2  SpO2 95%  Review of Systems  Constitutional: Negative for fever, chills, appetite change, fatigue and unexpected weight change.  HENT: Negative for ear pain, congestion, sore throat, trouble swallowing, neck pain, voice change and sinus pressure.   Eyes: Negative for visual disturbance.  Respiratory: Negative for cough, shortness of breath, wheezing and stridor.   Cardiovascular: Negative for chest pain, palpitations and leg swelling.  Gastrointestinal: Negative for nausea, vomiting, abdominal pain, diarrhea, constipation, blood in stool, abdominal distention and anal bleeding.  Genitourinary: Negative for dysuria and flank pain.  Musculoskeletal: Negative for myalgias, arthralgias and gait problem.  Skin: Negative for color change and rash.  Neurological: Positive for  dizziness, weakness and light-headedness. Negative for tremors, seizures, syncope, speech difficulty, numbness and headaches.  Hematological: Negative for adenopathy. Does not bruise/bleed easily.  Psychiatric/Behavioral: Negative for suicidal ideas, sleep disturbance and dysphoric mood. The patient is not nervous/anxious.        Objective:   Physical Exam  Constitutional: She is oriented to person, place, and time. She appears well-developed and well-nourished. No distress.  HENT:  Head: Normocephalic and atraumatic.  Right Ear: External ear normal.  Left Ear: External ear normal.  Nose: Nose normal.  Mouth/Throat: Oropharynx is clear and moist. No oropharyngeal exudate.  Eyes: Conjunctivae normal are normal. Pupils are equal, round, and reactive to light. Right eye exhibits no discharge. Left eye exhibits no discharge. No scleral icterus.  Neck: Normal range of motion. Neck supple. Carotid bruit is not present. No tracheal deviation present. No thyromegaly present.  Cardiovascular: Normal rate, regular rhythm, normal heart sounds and intact distal pulses.  Exam reveals no gallop and no friction rub.   No murmur heard. Pulmonary/Chest: Effort normal and breath sounds normal. No respiratory distress. She has no wheezes. She has no rales. She exhibits no tenderness.  Musculoskeletal: Normal range of motion. She exhibits no edema and no tenderness.  Lymphadenopathy:    She has no cervical adenopathy.  Neurological: She is alert and oriented to person, place, and time. No cranial nerve deficit. She exhibits normal muscle tone. Coordination normal.  Skin: Skin is warm and dry. No rash noted. She is not diaphoretic. No erythema. No pallor.  Psychiatric: She has a normal mood and affect. Her behavior is normal. Judgment and thought content normal.          Assessment & Plan:

## 2012-06-09 ENCOUNTER — Ambulatory Visit: Payer: Self-pay | Admitting: Internal Medicine

## 2012-06-09 DIAGNOSIS — R42 Dizziness and giddiness: Secondary | ICD-10-CM | POA: Diagnosis not present

## 2012-06-09 DIAGNOSIS — R930 Abnormal findings on diagnostic imaging of skull and head, not elsewhere classified: Secondary | ICD-10-CM | POA: Diagnosis not present

## 2012-06-09 DIAGNOSIS — I6529 Occlusion and stenosis of unspecified carotid artery: Secondary | ICD-10-CM | POA: Diagnosis not present

## 2012-06-09 DIAGNOSIS — I658 Occlusion and stenosis of other precerebral arteries: Secondary | ICD-10-CM | POA: Diagnosis not present

## 2012-06-10 ENCOUNTER — Telehealth: Payer: Self-pay | Admitting: Internal Medicine

## 2012-06-10 ENCOUNTER — Encounter: Payer: Self-pay | Admitting: Internal Medicine

## 2012-06-10 NOTE — Telephone Encounter (Signed)
CT of the head was normal.

## 2012-06-10 NOTE — Telephone Encounter (Signed)
Patient advised.

## 2012-06-15 LAB — PROTIME-INR

## 2012-06-18 ENCOUNTER — Encounter: Payer: Self-pay | Admitting: Internal Medicine

## 2012-06-21 ENCOUNTER — Encounter: Payer: Self-pay | Admitting: Internal Medicine

## 2012-06-22 ENCOUNTER — Ambulatory Visit: Payer: Medicare Other | Admitting: Internal Medicine

## 2012-06-24 ENCOUNTER — Encounter: Payer: Self-pay | Admitting: Internal Medicine

## 2012-06-25 DIAGNOSIS — L8990 Pressure ulcer of unspecified site, unspecified stage: Secondary | ICD-10-CM | POA: Diagnosis not present

## 2012-06-29 DIAGNOSIS — I4891 Unspecified atrial fibrillation: Secondary | ICD-10-CM | POA: Diagnosis not present

## 2012-06-29 LAB — PROTIME-INR

## 2012-06-30 ENCOUNTER — Ambulatory Visit (INDEPENDENT_AMBULATORY_CARE_PROVIDER_SITE_OTHER): Payer: Medicare Other | Admitting: Internal Medicine

## 2012-06-30 ENCOUNTER — Encounter: Payer: Self-pay | Admitting: Internal Medicine

## 2012-06-30 VITALS — BP 104/60 | HR 72 | Temp 98.2°F | Ht 62.0 in | Wt 125.0 lb

## 2012-06-30 DIAGNOSIS — I4891 Unspecified atrial fibrillation: Secondary | ICD-10-CM

## 2012-06-30 DIAGNOSIS — Z7901 Long term (current) use of anticoagulants: Secondary | ICD-10-CM

## 2012-06-30 DIAGNOSIS — E785 Hyperlipidemia, unspecified: Secondary | ICD-10-CM

## 2012-06-30 DIAGNOSIS — D510 Vitamin B12 deficiency anemia due to intrinsic factor deficiency: Secondary | ICD-10-CM

## 2012-06-30 DIAGNOSIS — R42 Dizziness and giddiness: Secondary | ICD-10-CM

## 2012-06-30 DIAGNOSIS — E039 Hypothyroidism, unspecified: Secondary | ICD-10-CM

## 2012-06-30 DIAGNOSIS — K649 Unspecified hemorrhoids: Secondary | ICD-10-CM

## 2012-06-30 LAB — COMPREHENSIVE METABOLIC PANEL
ALT: 22 U/L (ref 0–35)
AST: 27 U/L (ref 0–37)
Albumin: 3.6 g/dL (ref 3.5–5.2)
Alkaline Phosphatase: 58 U/L (ref 39–117)
BUN: 20 mg/dL (ref 6–23)
CO2: 26 mEq/L (ref 19–32)
Calcium: 9.1 mg/dL (ref 8.4–10.5)
Chloride: 99 mEq/L (ref 96–112)
Creatinine, Ser: 0.8 mg/dL (ref 0.4–1.2)
GFR: 68.05 mL/min (ref >60.00)
Glucose, Bld: 114 mg/dL — ABNORMAL HIGH (ref 70–99)
Potassium: 3 mEq/L — ABNORMAL LOW (ref 3.5–5.1)
Sodium: 136 mEq/L (ref 135–145)
Total Bilirubin: 1.1 mg/dL (ref 0.3–1.2)
Total Protein: 6.9 g/dL (ref 6.0–8.3)

## 2012-06-30 LAB — CBC WITH DIFFERENTIAL/PLATELET
Basophils Absolute: 0 10*3/uL (ref 0.0–0.1)
Basophils Relative: 0.3 % (ref 0.0–3.0)
Eosinophils Absolute: 0.1 10*3/uL (ref 0.0–0.7)
Eosinophils Relative: 1.5 % (ref 0.0–5.0)
HCT: 38.1 % (ref 36.0–46.0)
Hemoglobin: 12.9 g/dL (ref 12.0–15.0)
Lymphocytes Relative: 42.9 % (ref 12.0–46.0)
Lymphs Abs: 3.1 10*3/uL (ref 0.7–4.0)
MCHC: 33.9 g/dL (ref 30.0–36.0)
MCV: 89.4 fl (ref 78.0–100.0)
Monocytes Absolute: 0.4 10*3/uL (ref 0.1–1.0)
Monocytes Relative: 5.2 % (ref 3.0–12.0)
Neutro Abs: 3.7 10*3/uL (ref 1.4–7.7)
Neutrophils Relative %: 50.1 % (ref 43.0–77.0)
Platelets: 215 10*3/uL (ref 150.0–400.0)
RBC: 4.26 Mil/uL (ref 3.87–5.11)
RDW: 13.6 % (ref 11.5–14.6)
WBC: 7.3 10*3/uL (ref 4.5–10.5)

## 2012-06-30 LAB — VITAMIN B12: Vitamin B-12: 456 pg/mL (ref 211–911)

## 2012-06-30 LAB — LIPID PANEL
Cholesterol: 189 mg/dL (ref 0–200)
HDL: 45.9 mg/dL (ref >39.00)
Total CHOL/HDL Ratio: 4
Triglycerides: 372 mg/dL — ABNORMAL HIGH (ref 0.0–149.0)
VLDL: 74.4 mg/dL — ABNORMAL HIGH (ref 0.0–40.0)

## 2012-06-30 LAB — LDL CHOLESTEROL, DIRECT: Direct LDL: 86.3 mg/dL

## 2012-06-30 LAB — TSH: TSH: 2.64 u[IU]/mL (ref 0.35–5.50)

## 2012-06-30 NOTE — Assessment & Plan Note (Signed)
Chronic. Improved with use of hydrocortisone cream. Will continue.

## 2012-06-30 NOTE — Assessment & Plan Note (Signed)
Recent INR therapeutic. Will continue coumadin, goal INR 2-3.

## 2012-06-30 NOTE — Assessment & Plan Note (Signed)
Rate well controlled with current medications. Anticoagulated with coumadin. INR therapeutic. Continue current meds. No changes today. Follow up 1 month.

## 2012-06-30 NOTE — Assessment & Plan Note (Signed)
Symptoms of mild lightheadedness and inbalance persistent.  CT head normal, carotid dopplers showed no significant plaque. Will check CBC, CMP, B12, TSH with labs. Discussed setting up PT for vestibular therapy, however pt has completed this in the past, and would like to just perform exercises on her own for now. Follow up 4 weeks and prn.

## 2012-06-30 NOTE — Progress Notes (Signed)
Subjective:    Patient ID: Kara [name], female    DOB: Mar 28, 1923, 79 y.o.   MRN: [id number]  HPI 79 year old female with history of hypertension, atrial fibrillation presents for followup after recent episodes of lightheadedness and gait instability. She reports that symptoms of mild lightheadedness and some gait instability are persistent. After her last visit, she underwent CT of the head and carotid Dopplers which were normal. She has undergone vestibular therapy in the past with minimal improvement. She denies any focal symptoms of chest pain, palpitations, shortness of breath, change in bowel habits, fever, chills, change in appetite. She is otherwise feeling well. She has not had any recent falls. She reports that hemorrhoids were improved with use of hydrocortisone cream.  Outpatient Encounter Prescriptions as of 06/30/2012  Medication Sig Dispense Refill  . ALPRAZolam (XANAX) 0.25 MG tablet Take 0.25 mg by mouth every 8 (eight) hours as needed.      . Calcium Carbonate-Vitamin D (CALCIUM-D) 600-400 MG-UNIT TABS Take 1 tablet by mouth daily.      . cholecalciferol (VITAMIN D) 1000 UNITS tablet Take 1,000 Units by mouth daily.      . cyanocobalamin (,VITAMIN B-12,) 1000 MCG/ML injection Inject 1 mL (1,000 mcg total) into the muscle every 30 (thirty) days.  10 mL  1  . diltiazem (CARDIZEM CD) 120 MG 24 hr capsule Take 1 capsule (120 mg total) by mouth daily.  30 capsule  6  . Dimethicone-Zinc Oxide-Vit A-D (A & D ZINC OXIDE) CREA Apply 1 application topically 2 (two) times daily as needed.  113 g  2  . hydrocortisone-pramoxine (ANALPRAM-HC) 2.5-1 % rectal cream Place rectally 2 (two) times daily.  30 g  6  . Hypromellose (GENTEAL) 0.3 % SOLN Apply 1 drop to eye daily.      Marland Kitchen lisinopril (PRINIVIL,ZESTRIL) 5 MG tablet Take 5 mg by mouth daily.      Marland Kitchen loperamide (IMODIUM) 2 MG capsule Take 2 mg by mouth every 8 (eight) hours as needed.      . metoprolol (LOPRESSOR) 50 MG tablet Take 1 tablet  (50 mg total) by mouth 2 (two) times daily.  60 tablet  6  . nystatin (MYCOSTATIN) powder Apply topically 4 (four) times daily as needed.      . nystatin-triamcinolone ointment (MYCOLOG) Apply topically 2 (two) times daily.  30 g  0  . pantoprazole (PROTONIX) 20 MG tablet Take 20 mg by mouth daily as needed.       . warfarin (COUMADIN) 1 MG tablet Take 1.5 tablets (1.5 mg total) by mouth daily.  45 tablet  2  . HYDROcodone-acetaminophen (NORCO/VICODIN) 5-325 MG per tablet Take 1 tablet by mouth every 8 (eight) hours as needed.  90 tablet  1  . spironolactone (ALDACTONE) 25 MG tablet       . traMADol (ULTRAM) 50 MG tablet Take 50 mg by mouth every 6 (six) hours as needed.       No facility-administered encounter medications on file as of 06/30/2012.   BP 104/60  Pulse 72  Temp(Src) 98.2 F (36.8 C) (Oral)  Ht 5\' 2"  (1.575 m)  Wt 125 lb (56.7 kg)  BMI 22.86 kg/m2  SpO2 95%  Review of Systems  Constitutional: Negative for fever, chills, appetite change, fatigue and unexpected weight change.  HENT: Negative for ear pain, congestion, sore throat, trouble swallowing, neck pain, voice change and sinus pressure.   Eyes: Negative for visual disturbance.  Respiratory: Negative for cough, shortness of breath, wheezing  and stridor.   Cardiovascular: Negative for chest pain, palpitations and leg swelling.  Gastrointestinal: Negative for nausea, vomiting, abdominal pain, diarrhea, constipation, blood in stool, abdominal distention and anal bleeding.  Genitourinary: Negative for dysuria and flank pain.  Musculoskeletal: Negative for myalgias, arthralgias and gait problem.  Skin: Negative for color change and rash.  Neurological: Positive for light-headedness. Negative for dizziness and headaches.  Hematological: Negative for adenopathy. Does not bruise/bleed easily.  Psychiatric/Behavioral: Negative for suicidal ideas, sleep disturbance and dysphoric mood. The patient is not nervous/anxious.         Objective:   Physical Exam  Constitutional: She is oriented to person, place, and time. She appears well-developed and well-nourished. No distress.  HENT:  Head: Normocephalic and atraumatic.  Right Ear: External ear normal.  Left Ear: External ear normal.  Nose: Nose normal.  Mouth/Throat: Oropharynx is clear and moist. No oropharyngeal exudate.  Eyes: Conjunctivae are normal. Pupils are equal, round, and reactive to light. Right eye exhibits no discharge. Left eye exhibits no discharge. No scleral icterus.  Neck: Normal range of motion. Neck supple. No tracheal deviation present. No thyromegaly present.  Cardiovascular: Normal rate, regular rhythm and intact distal pulses.  Exam reveals no gallop and no friction rub.   Murmur heard. Pulmonary/Chest: Effort normal and breath sounds normal. No respiratory distress. She has no wheezes. She has no rales. She exhibits no tenderness.  Musculoskeletal: Normal range of motion. She exhibits no edema and no tenderness.  Lymphadenopathy:    She has no cervical adenopathy.  Neurological: She is alert and oriented to person, place, and time. No cranial nerve deficit. She exhibits normal muscle tone. Coordination normal.  Skin: Skin is warm and dry. No rash noted. She is not diaphoretic. No erythema. No pallor.  Psychiatric: She has a normal mood and affect. Her behavior is normal. Judgment and thought content normal.          Assessment & Plan:

## 2012-07-02 ENCOUNTER — Encounter: Payer: Medicare Other | Admitting: Internal Medicine

## 2012-07-05 ENCOUNTER — Encounter: Payer: Medicare Other | Admitting: Internal Medicine

## 2012-07-06 LAB — PROTIME-INR

## 2012-07-06 NOTE — Progress Notes (Signed)
Patient verbally informed and agreed understanding.

## 2012-07-13 LAB — PROTIME-INR: INR: 2.8 — AB (ref 0.9–1.1)

## 2012-07-20 ENCOUNTER — Encounter: Payer: Self-pay | Admitting: Internal Medicine

## 2012-07-22 ENCOUNTER — Encounter: Payer: Self-pay | Admitting: Internal Medicine

## 2012-07-27 DIAGNOSIS — I4891 Unspecified atrial fibrillation: Secondary | ICD-10-CM | POA: Diagnosis not present

## 2012-07-27 LAB — PROTIME-INR: INR: 3.3 — AB (ref 0.9–1.1)

## 2012-07-28 ENCOUNTER — Telehealth: Payer: Self-pay | Admitting: Internal Medicine

## 2012-07-28 NOTE — Telephone Encounter (Signed)
Recent INR elevated 3.3. Can you confirm with pt her dose of coumadin? Any recent med changes or use of antibiotics?

## 2012-07-28 NOTE — Telephone Encounter (Signed)
Can we ask her to reduce her dose of warfarin to 1mg  daily and repeat INR on Monday.

## 2012-07-28 NOTE — Telephone Encounter (Signed)
Spoke with patient and informed her of INR levels, she is currently taking 1.5 mg of Coumadin. She has 1mg  and 2mg  tablets at home, she cut the 1 mg in half and the 2mg  in half to give her the dose of 1.5 mg. However she has had a change in her diet, she stayed in the house for over a week so she was cooking for herself rather than going down stairs to eat but when she does go downstairs to eat, she has been a lot more salads. And has not been on any abx.

## 2012-07-28 NOTE — Telephone Encounter (Signed)
LMTCB

## 2012-07-29 ENCOUNTER — Encounter: Payer: Self-pay | Admitting: Internal Medicine

## 2012-07-29 NOTE — Telephone Encounter (Signed)
Patient informed and verbalized understanding. She will have her INR checked on Tuesday, this is the day they usually come to check it

## 2012-08-03 LAB — PROTIME-INR: INR: 2.5 — AB (ref 0.9–1.1)

## 2012-08-04 ENCOUNTER — Ambulatory Visit (INDEPENDENT_AMBULATORY_CARE_PROVIDER_SITE_OTHER): Payer: Medicare Other | Admitting: Internal Medicine

## 2012-08-04 ENCOUNTER — Encounter: Payer: Self-pay | Admitting: Internal Medicine

## 2012-08-04 VITALS — BP 110/60 | HR 60 | Temp 98.3°F | Wt 125.0 lb

## 2012-08-04 DIAGNOSIS — E876 Hypokalemia: Secondary | ICD-10-CM | POA: Diagnosis not present

## 2012-08-04 DIAGNOSIS — I4891 Unspecified atrial fibrillation: Secondary | ICD-10-CM

## 2012-08-04 DIAGNOSIS — R5383 Other fatigue: Secondary | ICD-10-CM

## 2012-08-04 DIAGNOSIS — Z7901 Long term (current) use of anticoagulants: Secondary | ICD-10-CM | POA: Diagnosis not present

## 2012-08-04 DIAGNOSIS — R197 Diarrhea, unspecified: Secondary | ICD-10-CM

## 2012-08-04 DIAGNOSIS — R5381 Other malaise: Secondary | ICD-10-CM | POA: Diagnosis not present

## 2012-08-04 DIAGNOSIS — K529 Noninfective gastroenteritis and colitis, unspecified: Secondary | ICD-10-CM

## 2012-08-04 MED ORDER — POTASSIUM CHLORIDE ER 10 MEQ PO CPCR: 10.0000 meq | ORAL_CAPSULE | Freq: Every day | ORAL | Status: DC

## 2012-08-04 MED ORDER — WARFARIN SODIUM 2 MG PO TABS: 1.0000 mg | ORAL_TABLET | Freq: Every day | ORAL | Status: DC

## 2012-08-04 NOTE — Assessment & Plan Note (Signed)
Doing very well with home health monitoring of INR. Coumadin dose recently decreased to 1mg  daily and pt reports INR yesterday was 2.5. Will continue current dose and repeat next week.

## 2012-08-04 NOTE — Assessment & Plan Note (Signed)
Rate has been well-controlled with current medications. Anticoagulated with Coumadin. INR therapeutic. Followup 3 months or sooner as needed.

## 2012-08-04 NOTE — Assessment & Plan Note (Signed)
Symptoms have recently improved. Likely related to colitis and colectomy last year. Will continue probiotics. Will continue to monitor.

## 2012-08-04 NOTE — Assessment & Plan Note (Signed)
Symptoms continuing to improve. Encouraged patient to continue to participate in exercise activities where she lives. Followup 3 months or sooner as needed.

## 2012-08-04 NOTE — Progress Notes (Signed)
Subjective:    Patient ID: Kara [name], female    DOB: 28-Mar-1923, 79 y.o.   MRN: [id number]  HPI 79 year old female with history of atrial fibrillation on chronic anticoagulation, hypertension, and chronic diarrhea presents for followup. She reports she is generally feeling well. She has been able to monitor her Coumadin level at home and Coumadin level yesterday was 2.5. She is currently taking 1 mg of Coumadin daily. She has not had any recent issues with bleeding or bruising. She reports some mild ongoing fatigue but is trying to increase her physical activity and participate in physical therapy exercises where she lives. No new concerns today.  Outpatient Encounter Prescriptions as of 08/04/2012  Medication Sig Dispense Refill  . Calcium Carbonate-Vitamin D (CALCIUM-D) 600-400 MG-UNIT TABS Take 1 tablet by mouth daily.      . cholecalciferol (VITAMIN D) 1000 UNITS tablet Take 1,000 Units by mouth daily.      . cyanocobalamin (,VITAMIN B-12,) 1000 MCG/ML injection Inject 1 mL (1,000 mcg total) into the muscle every 30 (thirty) days.  10 mL  1  . diltiazem (CARDIZEM CD) 120 MG 24 hr capsule Take 1 capsule (120 mg total) by mouth daily.  30 capsule  6  . hydrocortisone-pramoxine (ANALPRAM-HC) 2.5-1 % rectal cream Place rectally 2 (two) times daily.  30 g  6  . Hypromellose (GENTEAL) 0.3 % SOLN Apply 1 drop to eye daily.      Marland Kitchen loperamide (IMODIUM) 2 MG capsule Take 2 mg by mouth every 8 (eight) hours as needed.      . metoprolol (LOPRESSOR) 50 MG tablet Take 1 tablet (50 mg total) by mouth 2 (two) times daily.  60 tablet  6  . pantoprazole (PROTONIX) 20 MG tablet Take 20 mg by mouth daily as needed.       . triamterene-hydrochlorothiazide (MAXZIDE-25) 37.5-25 MG per tablet       . warfarin (COUMADIN) 1 MG tablet Take 1 mg by mouth daily.      . [DISCONTINUED] warfarin (COUMADIN) 1 MG tablet Take 1.5 tablets (1.5 mg total) by mouth daily.  45 tablet  2  . ALPRAZolam (XANAX) 0.25 MG tablet Take  0.25 mg by mouth every 8 (eight) hours as needed.      . Dimethicone-Zinc Oxide-Vit A-D (A & D ZINC OXIDE) CREA Apply 1 application topically 2 (two) times daily as needed.  113 g  2  . HYDROcodone-acetaminophen (NORCO/VICODIN) 5-325 MG per tablet Take 1 tablet by mouth every 8 (eight) hours as needed.  90 tablet  1  . lisinopril (PRINIVIL,ZESTRIL) 5 MG tablet Take 5 mg by mouth daily.      Marland Kitchen nystatin (MYCOSTATIN) powder Apply topically 4 (four) times daily as needed.      . nystatin-triamcinolone ointment (MYCOLOG) Apply topically 2 (two) times daily.  30 g  0  . potassium chloride (MICRO-K) 10 MEQ CR capsule Take 1 capsule (10 mEq total) by mouth daily.  30 capsule  3  . spironolactone (ALDACTONE) 25 MG tablet       . traMADol (ULTRAM) 50 MG tablet Take 50 mg by mouth every 6 (six) hours as needed.      . warfarin (COUMADIN) 2 MG tablet Take 0.5 tablets (1 mg total) by mouth daily.  30 tablet  3   No facility-administered encounter medications on file as of 08/04/2012.   BP 110/60  Pulse 60  Temp(Src) 98.3 F (36.8 C) (Oral)  Wt 125 lb (56.7 kg)  BMI 22.86 kg/m2  SpO2 95%  Review of Systems  Constitutional: Positive for fatigue. Negative for fever, chills, appetite change and unexpected weight change.  HENT: Negative for ear pain, congestion, sore throat, trouble swallowing, neck pain, voice change and sinus pressure.   Eyes: Negative for visual disturbance.  Respiratory: Negative for cough, shortness of breath, wheezing and stridor.   Cardiovascular: Negative for chest pain, palpitations and leg swelling.  Gastrointestinal: Negative for nausea, vomiting, abdominal pain, diarrhea, constipation, blood in stool, abdominal distention and anal bleeding.  Genitourinary: Negative for dysuria and flank pain.  Musculoskeletal: Negative for myalgias, arthralgias and gait problem.  Skin: Negative for color change and rash.  Neurological: Negative for dizziness and headaches.  Hematological:  Negative for adenopathy. Does not bruise/bleed easily.  Psychiatric/Behavioral: Negative for suicidal ideas, sleep disturbance and dysphoric mood. The patient is not nervous/anxious.        Objective:   Physical Exam  Constitutional: She is oriented to person, place, and time. She appears well-developed and well-nourished. No distress.  HENT:  Head: Normocephalic and atraumatic.  Right Ear: External ear normal.  Left Ear: External ear normal.  Nose: Nose normal.  Mouth/Throat: Oropharynx is clear and moist. No oropharyngeal exudate.  Eyes: Conjunctivae are normal. Pupils are equal, round, and reactive to light. Right eye exhibits no discharge. Left eye exhibits no discharge. No scleral icterus.  Neck: Normal range of motion. Neck supple. No tracheal deviation present. No thyromegaly present.  Cardiovascular: Normal rate, regular rhythm, normal heart sounds and intact distal pulses.  Exam reveals no gallop and no friction rub.   No murmur heard. Pulmonary/Chest: Effort normal and breath sounds normal. No respiratory distress. She has no wheezes. She has no rales. She exhibits no tenderness.  Musculoskeletal: Normal range of motion. She exhibits no edema and no tenderness.  Lymphadenopathy:    She has no cervical adenopathy.  Neurological: She is alert and oriented to person, place, and time. No cranial nerve deficit. She exhibits normal muscle tone. Coordination normal.  Skin: Skin is warm and dry. No rash noted. She is not diaphoretic. No erythema. No pallor.  Psychiatric: She has a normal mood and affect. Her behavior is normal. Judgment and thought content normal.          Assessment & Plan:

## 2012-08-10 LAB — PROTIME-INR

## 2012-08-11 ENCOUNTER — Telehealth: Payer: Self-pay | Admitting: Internal Medicine

## 2012-08-11 DIAGNOSIS — I10 Essential (primary) hypertension: Secondary | ICD-10-CM | POA: Diagnosis not present

## 2012-08-11 DIAGNOSIS — Z7901 Long term (current) use of anticoagulants: Secondary | ICD-10-CM | POA: Diagnosis not present

## 2012-08-11 DIAGNOSIS — I4891 Unspecified atrial fibrillation: Secondary | ICD-10-CM | POA: Diagnosis not present

## 2012-08-11 DIAGNOSIS — R12 Heartburn: Secondary | ICD-10-CM | POA: Diagnosis not present

## 2012-08-11 NOTE — Telephone Encounter (Signed)
Informed patient and she verbally agreed understanding. She wrote it down and will check next week.

## 2012-08-11 NOTE — Telephone Encounter (Signed)
Coumadin level was very low with INR 1.8. Please confirm that patient is taking her medication. If she is taking 1 mg per day, we may need to alternate 1 mg with 1.5 mg every other day. She should repeat INR in one week.

## 2012-08-16 ENCOUNTER — Encounter: Payer: Self-pay | Admitting: Internal Medicine

## 2012-08-16 DIAGNOSIS — E876 Hypokalemia: Secondary | ICD-10-CM | POA: Diagnosis not present

## 2012-08-17 LAB — PROTIME-INR

## 2012-08-18 ENCOUNTER — Telehealth: Payer: Self-pay | Admitting: Internal Medicine

## 2012-08-18 ENCOUNTER — Telehealth: Payer: Self-pay | Admitting: *Deleted

## 2012-08-18 NOTE — Telephone Encounter (Signed)
[facility] called with critical reading of 1.4. Test done on 08-17-12

## 2012-08-18 NOTE — Telephone Encounter (Signed)
Yes, we have asked her to increase Coumadin dose to 1.5mg  daily and repeat INR 1 week.

## 2012-08-18 NOTE — Telephone Encounter (Signed)
Her potassium level was normal on the labs. She could try stopping potassium supplement and we can repeat potassium level in 1-2 weeks.

## 2012-08-18 NOTE — Telephone Encounter (Signed)
Patient informed and verbally agreed understanding.  

## 2012-08-18 NOTE — Telephone Encounter (Signed)
Patient state she was started back on potassium pills but she does not think they are agreeing with her. She has had diarrhea since she began taking them. She did have some labs done yesterday and will wait on those results before she just stop taking them. Would like to know the results once we get them.

## 2012-08-18 NOTE — Telephone Encounter (Signed)
She has been taking her medication as directed. 1 mg and 1.5 mg alternating the days with one another.

## 2012-08-18 NOTE — Telephone Encounter (Signed)
Coumadin level was very low. Has pt been taking her medication?

## 2012-08-18 NOTE — Telephone Encounter (Signed)
Left message to call back  

## 2012-08-18 NOTE — Telephone Encounter (Signed)
We should increase back to 1.5mg  daily and repeat INR in 1 week.

## 2012-08-19 NOTE — Telephone Encounter (Signed)
Called patient this morning and verbally informed her about the potassium. She verbally agreed understanding

## 2012-08-24 ENCOUNTER — Telehealth: Payer: Self-pay | Admitting: *Deleted

## 2012-08-24 ENCOUNTER — Encounter: Payer: Self-pay | Admitting: Internal Medicine

## 2012-08-24 DIAGNOSIS — I4891 Unspecified atrial fibrillation: Secondary | ICD-10-CM | POA: Diagnosis not present

## 2012-08-24 LAB — PROTIME-INR: INR: 2.5 — AB (ref 0.9–1.1)

## 2012-08-24 NOTE — Telephone Encounter (Signed)
Spoke with patient, we received her INR results from [facility] it was 2.5 within range. Note was given to Dr. Lorin Picket, she reviewed it and requested patient stay on the same dose she is currently taking which is 1.5 mg and recheck it on Thursday or Friday. Patient state she can not check it on 2 times in 1 week because her insurance may not pay for it, she is going to wait to check it next Tuesday on her usual schedule.

## 2012-08-26 NOTE — Telephone Encounter (Signed)
That is fine 

## 2012-08-31 ENCOUNTER — Encounter: Payer: Self-pay | Admitting: Internal Medicine

## 2012-09-01 ENCOUNTER — Encounter: Payer: Self-pay | Admitting: Internal Medicine

## 2012-09-01 ENCOUNTER — Telehealth: Payer: Self-pay | Admitting: Internal Medicine

## 2012-09-01 DIAGNOSIS — Z7901 Long term (current) use of anticoagulants: Secondary | ICD-10-CM

## 2012-09-01 LAB — PROTIME-INR

## 2012-09-01 NOTE — Telephone Encounter (Signed)
INR elevated 3.3 today. Can you confirm coumadin dose?

## 2012-09-01 NOTE — Telephone Encounter (Signed)
Spoke with patient and she is taking 1.5 mg every night as she was directed to do. Also she would like to know since she need to have her potassium checked again, could you please give the order to [facility]. Boyd Kerbs is the nurse at her assisted living home

## 2012-09-01 NOTE — Telephone Encounter (Signed)
Fine to send order to [facility] for BMP. I will place order for referral to hematology.

## 2012-09-01 NOTE — Telephone Encounter (Signed)
She is on such a minimal dose of Coumadin and has pronounced a sensitivity to minimal dose. She may have a genetic predisposition to Coumadin sensitivity. I would like to set up evaluation with hematology to see if she might be a candidate for an alternative medication. In the interim, I would prefer to have her alternate 1 mg daily with 1.5 mg daily and plan to repeat INR next week.

## 2012-09-01 NOTE — Telephone Encounter (Signed)
Spoke with patient and she verbalized understanding of instructions to taking her coumadin. Would like the order for her Potassium to be drawn by [facility] sent to [phone]

## 2012-09-02 NOTE — Telephone Encounter (Signed)
Printed, signed and faxed.  

## 2012-09-06 ENCOUNTER — Other Ambulatory Visit: Payer: Self-pay | Admitting: Internal Medicine

## 2012-09-06 NOTE — Telephone Encounter (Signed)
Rx sent to pharmacy by escript  

## 2012-09-07 LAB — PROTIME-INR

## 2012-09-08 DIAGNOSIS — E876 Hypokalemia: Secondary | ICD-10-CM | POA: Diagnosis not present

## 2012-09-10 ENCOUNTER — Telehealth: Payer: Self-pay | Admitting: Internal Medicine

## 2012-09-10 ENCOUNTER — Encounter: Payer: Self-pay | Admitting: *Deleted

## 2012-09-10 NOTE — Telephone Encounter (Signed)
Called and spoke with patient, she verbalized understanding requested letter for nurse Boyd Kerbs to draw her blood. Put letter in mail with a list of foods high in potassium.

## 2012-09-10 NOTE — Telephone Encounter (Signed)
Received labs from [facility]. Labs show that potassium is slightly low. I would recommend increased intake of potassium rich foods such as oranges, bananas. We should recheck in 1-2 weeks.

## 2012-09-13 ENCOUNTER — Ambulatory Visit: Payer: Self-pay | Admitting: Oncology

## 2012-09-13 DIAGNOSIS — Z7901 Long term (current) use of anticoagulants: Secondary | ICD-10-CM | POA: Diagnosis not present

## 2012-09-13 DIAGNOSIS — I10 Essential (primary) hypertension: Secondary | ICD-10-CM | POA: Diagnosis not present

## 2012-09-13 DIAGNOSIS — Z79899 Other long term (current) drug therapy: Secondary | ICD-10-CM | POA: Diagnosis not present

## 2012-09-13 DIAGNOSIS — I4891 Unspecified atrial fibrillation: Secondary | ICD-10-CM | POA: Diagnosis not present

## 2012-09-13 LAB — PROTIME-INR
INR: 2
Prothrombin Time: 22 secs — ABNORMAL HIGH (ref 11.5–14.7)

## 2012-09-15 ENCOUNTER — Encounter: Payer: Self-pay | Admitting: Internal Medicine

## 2012-09-16 ENCOUNTER — Ambulatory Visit: Payer: Self-pay | Admitting: Oncology

## 2012-09-21 ENCOUNTER — Encounter: Payer: Medicare Other | Admitting: Internal Medicine

## 2012-09-21 DIAGNOSIS — I4891 Unspecified atrial fibrillation: Secondary | ICD-10-CM | POA: Diagnosis not present

## 2012-09-21 LAB — PROTIME-INR

## 2012-09-23 ENCOUNTER — Encounter: Payer: Self-pay | Admitting: Internal Medicine

## 2012-09-27 LAB — PROTIME-INR

## 2012-09-29 ENCOUNTER — Telehealth: Payer: Self-pay | Admitting: Internal Medicine

## 2012-09-29 NOTE — Telephone Encounter (Signed)
CMP, CBC, TSH, lipids V70.0

## 2012-09-29 NOTE — Telephone Encounter (Signed)
No. She does not need any labs today that I know of.

## 2012-09-29 NOTE — Telephone Encounter (Signed)
Spoke with patient and she stated she has a wellness visit coming up on the 21st she would like to have her labs drawn because she has not had them drawn in a while. If this is ok then could we please send the order to [facility].

## 2012-09-29 NOTE — Telephone Encounter (Signed)
Left message on the nurse voicemail letting her know patient does not need any labs done at this time.

## 2012-09-29 NOTE — Telephone Encounter (Signed)
Boyd Kerbs from [facility] left a message stating Ms. Sage said she is to have labs done today but there is no order. Does she need any labs done by the nurse there, I did not see anything.

## 2012-09-29 NOTE — Telephone Encounter (Signed)
Pt called stated she has not had her labs done yet wanted to know if she could wait till her cpx 10/06/12.  Also she has not received the list of food with potassium. Please advise pt

## 2012-09-30 ENCOUNTER — Encounter: Payer: Self-pay | Admitting: *Deleted

## 2012-09-30 NOTE — Telephone Encounter (Signed)
Order letter printed to be signed and faxed.

## 2012-09-30 NOTE — Telephone Encounter (Signed)
Signed and faxed to [phone]

## 2012-10-03 ENCOUNTER — Other Ambulatory Visit: Payer: Self-pay | Admitting: Internal Medicine

## 2012-10-04 DIAGNOSIS — Z0000 Encounter for general adult medical examination without abnormal findings: Secondary | ICD-10-CM | POA: Diagnosis not present

## 2012-10-04 DIAGNOSIS — R5381 Other malaise: Secondary | ICD-10-CM | POA: Diagnosis not present

## 2012-10-04 DIAGNOSIS — I4891 Unspecified atrial fibrillation: Secondary | ICD-10-CM | POA: Diagnosis not present

## 2012-10-04 DIAGNOSIS — E876 Hypokalemia: Secondary | ICD-10-CM | POA: Diagnosis not present

## 2012-10-04 DIAGNOSIS — R197 Diarrhea, unspecified: Secondary | ICD-10-CM | POA: Diagnosis not present

## 2012-10-04 DIAGNOSIS — R5383 Other fatigue: Secondary | ICD-10-CM | POA: Diagnosis not present

## 2012-10-04 NOTE — Telephone Encounter (Signed)
Refill denied, Pt has refills, confirmed by pharmacy

## 2012-10-05 ENCOUNTER — Telehealth: Payer: Self-pay | Admitting: Internal Medicine

## 2012-10-05 LAB — PROTIME-INR: INR: 2.4 — AB (ref 0.9–1.1)

## 2012-10-05 NOTE — Telephone Encounter (Signed)
Labs reviewed from 10/04/2012 were essentially normal. Triglycerides were slightly high, we can discuss at visit.

## 2012-10-05 NOTE — Telephone Encounter (Signed)
Informed patient of lab results and she verbally agreed understanding.

## 2012-10-06 ENCOUNTER — Ambulatory Visit (INDEPENDENT_AMBULATORY_CARE_PROVIDER_SITE_OTHER): Payer: Medicare Other | Admitting: Internal Medicine

## 2012-10-06 ENCOUNTER — Encounter: Payer: Self-pay | Admitting: Internal Medicine

## 2012-10-06 VITALS — BP 100/58 | HR 60 | Temp 98.4°F | Ht 61.5 in | Wt 124.0 lb

## 2012-10-06 DIAGNOSIS — Z0000 Encounter for general adult medical examination without abnormal findings: Secondary | ICD-10-CM

## 2012-10-06 DIAGNOSIS — Z7901 Long term (current) use of anticoagulants: Secondary | ICD-10-CM | POA: Insufficient documentation

## 2012-10-06 MED ORDER — HYDROCORTISONE ACE-PRAMOXINE 2.5-1 % RE CREA: TOPICAL_CREAM | Freq: Three times a day (TID) | RECTAL | Status: DC

## 2012-10-06 NOTE — Progress Notes (Signed)
Subjective:    Patient ID: Kara [name], female    DOB: 01-02-1923, 79 y.o.   MRN: [id number]  HPI The patient is here for annual Medicare wellness examination and management of other chronic and acute problems.   The risk factors are reflected in the social history.  The roster of all physicians providing medical care to patient - is listed in the Snapshot section of the chart.  Activities of daily living:  The patient is 100% independent in all ADLs: dressing, toileting, feeding as well as independent mobility. Pt receives one meal per day from ALF.  Home safety : The patient has smoke detectors in the home. They wear seatbelts.  There are no firearms at home. There is no violence in the home.   There is no risks for hepatitis, STDs or HIV. There is no history of blood transfusion. They have no travel history to infectious disease endemic areas of the world.  The patient has seen their dentist in the last six month. Dentist - Dr. Beverely Pace  They have seen their eye doctor in the last year. Slight decline in visual acuity after recent hospitalization. Dr. Fransico Michael  No issues with hearing.  They have deferred audiologic testing in the last year.   They do not  have excessive sun exposure. Discussed the need for sun protection: hats, long sleeves and use of sunscreen if there is significant sun exposure. Dermatologist - Dr. Roseanne Kaufman  Diet: the importance of a healthy diet is discussed. They do have a healthy diet.  The benefits of regular aerobic exercise were discussed. She walks for exercise several times per week.  Depression screen: there are no signs or vegative symptoms of depression- irritability, change in appetite, anhedonia, sadness/tearfullness.  Cognitive assessment: the patient manages all their financial and personal affairs and is actively engaged. They could relate day,date,year and events.  The following portions of the patient's history were reviewed and updated as  appropriate: allergies, current medications, past family history, past medical history,  past surgical history, past social history  and problem list.  Visual acuity was not assessed per patient preference since she has regular follow up with her ophthalmologist. Hearing and body mass index were assessed and reviewed.   During the course of the visit the patient was educated and counseled about appropriate screening and preventive services including : fall prevention , diabetes screening, nutrition counseling, colorectal cancer screening, and recommended immunizations.    Outpatient Encounter Prescriptions as of 10/06/2012  Medication Sig Dispense Refill  . cholecalciferol (VITAMIN D) 1000 UNITS tablet Take 1,000 Units by mouth daily.      . cyanocobalamin (,VITAMIN B-12,) 1000 MCG/ML injection Inject 1 mL (1,000 mcg total) into the muscle every 30 (thirty) days.  10 mL  1  . Dimethicone-Zinc Oxide-Vit A-D (A & D ZINC OXIDE) CREA Apply 1 application topically 2 (two) times daily as needed.  113 g  2  . HYDROcodone-acetaminophen (NORCO/VICODIN) 5-325 MG per tablet Take 1 tablet by mouth every 8 (eight) hours as needed.  90 tablet  1  . hydrocortisone-pramoxine (ANALPRAM-HC) 2.5-1 % rectal cream Place rectally 2 (two) times daily.  30 g  6  . Hypromellose (GENTEAL) 0.3 % SOLN Apply 1 drop to eye daily.      Marland Kitchen lisinopril (PRINIVIL,ZESTRIL) 5 MG tablet Take 5 mg by mouth daily.      Marland Kitchen loperamide (IMODIUM) 2 MG capsule Take 2 mg by mouth every 8 (eight) hours as needed.      Marland Kitchen  metoprolol (LOPRESSOR) 50 MG tablet TAKE 1 TABLET (50 MG TOTAL) BY MOUTH 2 (TWO) TIMES DAILY.  60 tablet  5  . nystatin (MYCOSTATIN) powder Apply topically 4 (four) times daily as needed.      . nystatin-triamcinolone ointment (MYCOLOG) Apply topically 2 (two) times daily.  30 g  0  . triamterene-hydrochlorothiazide (MAXZIDE-25) 37.5-25 MG per tablet       . warfarin (COUMADIN) 1 MG tablet Take 1 mg by mouth daily.      Marland Kitchen  warfarin (COUMADIN) 2 MG tablet Take 0.5 tablets (1 mg total) by mouth daily.  30 tablet  3  . ALPRAZolam (XANAX) 0.25 MG tablet Take 0.25 mg by mouth every 8 (eight) hours as needed.      . Calcium Carbonate-Vitamin D (CALCIUM-D) 600-400 MG-UNIT TABS Take 1 tablet by mouth daily.      Marland Kitchen diltiazem (CARDIZEM CD) 120 MG 24 hr capsule Take 1 capsule (120 mg total) by mouth daily.  30 capsule  6  . pantoprazole (PROTONIX) 20 MG tablet Take 20 mg by mouth daily as needed.       . potassium chloride (MICRO-K) 10 MEQ CR capsule Take 1 capsule (10 mEq total) by mouth daily.  30 capsule  3  . spironolactone (ALDACTONE) 25 MG tablet       . traMADol (ULTRAM) 50 MG tablet Take 50 mg by mouth every 6 (six) hours as needed.       No facility-administered encounter medications on file as of 10/06/2012.   BP 100/58  Pulse 60  Temp(Src) 98.4 F (36.9 C) (Oral)  Ht 5' 1.5" (1.562 m)  Wt 124 lb (56.246 kg)  BMI 23.05 kg/m2  SpO2 97%    Review of Systems  Constitutional: Negative for fever, chills, appetite change, fatigue and unexpected weight change.  HENT: Negative for ear pain, congestion, sore throat, trouble swallowing, neck pain, voice change and sinus pressure.   Eyes: Negative for visual disturbance.  Respiratory: Negative for cough, shortness of breath, wheezing and stridor.   Cardiovascular: Positive for palpitations. Negative for chest pain and leg swelling.  Gastrointestinal: Positive for diarrhea (chronic) and abdominal distention. Negative for nausea, vomiting, abdominal pain, constipation, blood in stool and anal bleeding.  Genitourinary: Negative for dysuria and flank pain.  Musculoskeletal: Negative for myalgias, arthralgias and gait problem.  Skin: Negative for color change and rash.  Neurological: Negative for dizziness and headaches.  Hematological: Negative for adenopathy. Does not bruise/bleed easily.  Psychiatric/Behavioral: Negative for suicidal ideas, sleep disturbance and  dysphoric mood. The patient is not nervous/anxious.        Objective:   Physical Exam  Constitutional: She is oriented to person, place, and time. She appears well-developed and well-nourished. No distress.  HENT:  Head: Normocephalic and atraumatic.  Right Ear: External ear normal.  Left Ear: External ear normal.  Nose: Nose normal.  Mouth/Throat: Oropharynx is clear and moist. No oropharyngeal exudate.  Eyes: Conjunctivae are normal. Pupils are equal, round, and reactive to light. Right eye exhibits no discharge. Left eye exhibits no discharge. No scleral icterus.  Neck: Normal range of motion. Neck supple. No tracheal deviation present. No thyromegaly present.  Cardiovascular: Normal rate, regular rhythm, normal heart sounds and intact distal pulses.  Exam reveals no gallop and no friction rub.   No murmur heard. Pulmonary/Chest: Effort normal and breath sounds normal. No accessory muscle usage. Not tachypneic. No respiratory distress. She has no decreased breath sounds. She has no wheezes. She has no  rhonchi. She has no rales. She exhibits no tenderness. Right breast exhibits no inverted nipple, no mass, no nipple discharge, no skin change and no tenderness. Left breast exhibits no inverted nipple, no mass, no nipple discharge, no skin change and no tenderness. Breasts are symmetrical.  Abdominal: Soft. Bowel sounds are normal. She exhibits no distension and no mass. There is no tenderness. There is no rebound and no guarding.  Musculoskeletal: Normal range of motion. She exhibits no edema and no tenderness.  Lymphadenopathy:    She has no cervical adenopathy.  Neurological: She is alert and oriented to person, place, and time. No cranial nerve deficit. She exhibits normal muscle tone. Coordination normal.  Skin: Skin is warm and dry. No rash noted. She is not diaphoretic. No erythema. No pallor.  Psychiatric: She has a normal mood and affect. Her behavior is normal. Judgment and thought  content normal.          Assessment & Plan:

## 2012-10-06 NOTE — Assessment & Plan Note (Signed)
General medical exam including breast exam normal today. Pelvic exam and PAP deferred given age and h/o normal PAPs in past. Mammogram UTD. Colonoscopy UTD. Immunizations UTD. Appropriate screening performed. No concerns today. Follow up 3 months and prn.

## 2012-10-12 ENCOUNTER — Other Ambulatory Visit: Payer: Self-pay | Admitting: *Deleted

## 2012-10-12 LAB — PROTIME-INR

## 2012-10-12 MED ORDER — WARFARIN SODIUM 1 MG PO TABS: 1.0000 mg | ORAL_TABLET | Freq: Every day | ORAL | Status: DC

## 2012-10-12 NOTE — Telephone Encounter (Signed)
Patient called stating she would like suppositories instead of the cream, new rx called to pharmacy and a refill on her coumadin.

## 2012-10-13 ENCOUNTER — Encounter: Payer: Self-pay | Admitting: Internal Medicine

## 2012-10-14 ENCOUNTER — Telehealth: Payer: Self-pay | Admitting: *Deleted

## 2012-10-14 MED ORDER — WARFARIN SODIUM 1 MG PO TABS: ORAL_TABLET | ORAL | Status: DC

## 2012-10-14 NOTE — Telephone Encounter (Signed)
Pharmacy Note:  Coumadin 1 mg tab  Pt says she take 1 and 1/2 daily  Please verify with new Rx

## 2012-10-14 NOTE — Telephone Encounter (Signed)
Sunshine from [facility] called saying that pt gave them a hand written order and the dx code that was given was invalid    [facility] # ask for Sunshine: [phone]

## 2012-10-14 NOTE — Telephone Encounter (Signed)
[name] - Can you call them and find out who this was regarding?

## 2012-10-15 NOTE — Telephone Encounter (Signed)
Left message to call back  

## 2012-10-19 DIAGNOSIS — I4891 Unspecified atrial fibrillation: Secondary | ICD-10-CM | POA: Diagnosis not present

## 2012-10-19 DIAGNOSIS — I10 Essential (primary) hypertension: Secondary | ICD-10-CM | POA: Diagnosis not present

## 2012-10-19 DIAGNOSIS — I739 Peripheral vascular disease, unspecified: Secondary | ICD-10-CM | POA: Diagnosis not present

## 2012-10-19 DIAGNOSIS — M79609 Pain in unspecified limb: Secondary | ICD-10-CM | POA: Diagnosis not present

## 2012-10-19 LAB — PROTIME-INR: INR: 2.4 — AB (ref 0.9–1.1)

## 2012-10-19 NOTE — Telephone Encounter (Signed)
Left message to call back  

## 2012-10-21 ENCOUNTER — Encounter: Payer: Self-pay | Admitting: Internal Medicine

## 2012-10-21 NOTE — Telephone Encounter (Signed)
No return call 

## 2012-10-25 ENCOUNTER — Encounter: Payer: Self-pay | Admitting: Internal Medicine

## 2012-10-26 LAB — PROTIME-INR

## 2012-10-27 DIAGNOSIS — E119 Type 2 diabetes mellitus without complications: Secondary | ICD-10-CM | POA: Diagnosis not present

## 2012-11-02 ENCOUNTER — Encounter: Payer: Self-pay | Admitting: Internal Medicine

## 2012-11-02 LAB — PROTIME-INR: INR: 2.9 — AB (ref 0.9–1.1)

## 2012-11-04 DIAGNOSIS — I4891 Unspecified atrial fibrillation: Secondary | ICD-10-CM | POA: Diagnosis not present

## 2012-11-09 LAB — PROTIME-INR

## 2012-11-10 ENCOUNTER — Telehealth: Payer: Self-pay | Admitting: Internal Medicine

## 2012-11-10 NOTE — Telephone Encounter (Signed)
INR was 3.7 on 6/24. Has she been on any new medications such as antibiotics? Has she changed her coumadin dose? Can you confirm dose of Coumadin she is taking?

## 2012-11-11 NOTE — Telephone Encounter (Signed)
Patient informed and verbally agreed understanding.  

## 2012-11-11 NOTE — Telephone Encounter (Signed)
No she has not been on anything but she has been taking Metamucil for all the gas she been having, someone told her not to take that within 2 hours of her other medication so that threw her off on her medication schedule. Her pacemaker was adjusted and she has been trying to eat a lot more vegetables like raw vegetables. She is currently taking 1 mg. Alternating with 1.5 mg every other day. She does have 2 mg tablets that she is cutting in half so she can get rid of them but she noticed her number was off and couldn't figure out why.

## 2012-11-11 NOTE — Telephone Encounter (Signed)
Let's have her take 1mg  of coumadin daily and plan to repeat INR next week.

## 2012-11-16 DIAGNOSIS — I4891 Unspecified atrial fibrillation: Secondary | ICD-10-CM | POA: Diagnosis not present

## 2012-11-16 LAB — PROTIME-INR

## 2012-11-17 ENCOUNTER — Encounter: Payer: Self-pay | Admitting: Internal Medicine

## 2012-11-23 LAB — PROTIME-INR

## 2012-11-30 ENCOUNTER — Telehealth: Payer: Self-pay | Admitting: *Deleted

## 2012-11-30 LAB — PROTIME-INR

## 2012-11-30 NOTE — Telephone Encounter (Signed)
MD INR called with INR of 4.5 done today. Spoke with pt, she is currently taking Coumadin 1 mg daily.

## 2012-11-30 NOTE — Telephone Encounter (Signed)
Pt is not on antibiotics, denies any vomiting or bleeding, states eating well. Advised to hold Coumadin for tonight and recheck PT/INR tomorrow.

## 2012-11-30 NOTE — Telephone Encounter (Signed)
Hold coumadin.  Confirm pt not on antibiotics or sick (ie vomiting, not eating, etc).  Make sure no bleeding.  Recheck pt/inr tomorrow.

## 2012-12-01 ENCOUNTER — Encounter: Payer: Self-pay | Admitting: Internal Medicine

## 2012-12-01 ENCOUNTER — Telehealth: Payer: Self-pay | Admitting: *Deleted

## 2012-12-01 LAB — PROTIME-INR

## 2012-12-01 NOTE — Telephone Encounter (Signed)
[facility]R fax shows INR 3.8 today. Dr. Lorin Picket notified. Advised pt to hold Coumadin today and tomorrow. Advised to recheck INR on Friday 12/03/12 and we will notify her then with new regimen. Pt verbalized understanding.

## 2012-12-02 ENCOUNTER — Encounter: Payer: Self-pay | Admitting: Internal Medicine

## 2012-12-03 ENCOUNTER — Telehealth: Payer: Self-pay | Admitting: *Deleted

## 2012-12-03 LAB — PROTIME-INR

## 2012-12-03 NOTE — Telephone Encounter (Signed)
INR was 1.7 today, spoke with patient and per Dr. Lorin Picket she would like her to take 1 mg today and tomorrow, then on Sunday began taking 1/2 mg of the 1 mg tablets daily. Recheck INR on Monday or Tuesday. She will recheck it on Tuesday per patient, she verbalized understanding.

## 2012-12-07 ENCOUNTER — Telehealth: Payer: Self-pay | Admitting: Internal Medicine

## 2012-12-07 LAB — PROTIME-INR

## 2012-12-07 NOTE — Telephone Encounter (Signed)
Per Dr. Lorin Picket orders she took 1/2 mg last night and the night before. Would like to know what to do?

## 2012-12-07 NOTE — Telephone Encounter (Signed)
Per Dr. Dan Humphreys instructions, patient should take 1 mg daily and then recheck on Friday. Patient verbalized understanding.

## 2012-12-07 NOTE — Telephone Encounter (Signed)
Recent INR on 12/07/2012 was low 1.3. Can you please confirm with patient that she is taking her medication as directed.

## 2012-12-09 ENCOUNTER — Encounter: Payer: Self-pay | Admitting: Internal Medicine

## 2012-12-10 ENCOUNTER — Telehealth: Payer: Self-pay | Admitting: *Deleted

## 2012-12-10 LAB — PROTIME-INR: INR: 1.5 — AB (ref 0.9–1.1)

## 2012-12-10 NOTE — Telephone Encounter (Signed)
Patient informed and verbally agreed understanding.  

## 2012-12-10 NOTE — Telephone Encounter (Signed)
Continue 1mg  coumadin daily and repeat INR Tuesday

## 2012-12-10 NOTE — Telephone Encounter (Signed)
Left message patient INR today was out of range, it was 1.5

## 2012-12-14 ENCOUNTER — Telehealth: Payer: Self-pay | Admitting: Internal Medicine

## 2012-12-14 DIAGNOSIS — I4891 Unspecified atrial fibrillation: Secondary | ICD-10-CM | POA: Diagnosis not present

## 2012-12-14 LAB — PROTIME-INR

## 2012-12-14 NOTE — Telephone Encounter (Signed)
Called and spoke with patient appointment scheduled for Thursday

## 2012-12-14 NOTE — Telephone Encounter (Signed)
INR continues to be low at 1.5. Please bring in pt for follow up visit this week to discuss low INR and coumadin therapy.

## 2012-12-15 ENCOUNTER — Encounter: Payer: Self-pay | Admitting: Internal Medicine

## 2012-12-16 ENCOUNTER — Encounter: Payer: Self-pay | Admitting: Internal Medicine

## 2012-12-16 ENCOUNTER — Telehealth: Payer: Self-pay | Admitting: Internal Medicine

## 2012-12-16 ENCOUNTER — Ambulatory Visit (INDEPENDENT_AMBULATORY_CARE_PROVIDER_SITE_OTHER): Payer: Medicare Other | Admitting: Internal Medicine

## 2012-12-16 VITALS — BP 108/58 | HR 61 | Temp 98.2°F | Wt 127.0 lb

## 2012-12-16 DIAGNOSIS — I4891 Unspecified atrial fibrillation: Secondary | ICD-10-CM | POA: Diagnosis not present

## 2012-12-16 DIAGNOSIS — R197 Diarrhea, unspecified: Secondary | ICD-10-CM

## 2012-12-16 DIAGNOSIS — Z7901 Long term (current) use of anticoagulants: Secondary | ICD-10-CM | POA: Diagnosis not present

## 2012-12-16 DIAGNOSIS — K529 Noninfective gastroenteritis and colitis, unspecified: Secondary | ICD-10-CM

## 2012-12-16 DIAGNOSIS — R5383 Other fatigue: Secondary | ICD-10-CM

## 2012-12-16 DIAGNOSIS — R5381 Other malaise: Secondary | ICD-10-CM | POA: Diagnosis not present

## 2012-12-16 LAB — COMPREHENSIVE METABOLIC PANEL
ALT: 15 U/L (ref 0–35)
AST: 21 U/L (ref 0–37)
Albumin: 3.4 g/dL — ABNORMAL LOW (ref 3.5–5.2)
Alkaline Phosphatase: 65 U/L (ref 39–117)
BUN: 17 mg/dL (ref 6–23)
CO2: 28 mEq/L (ref 19–32)
Calcium: 9 mg/dL (ref 8.4–10.5)
Chloride: 100 mEq/L (ref 96–112)
Creatinine, Ser: 0.9 mg/dL (ref 0.4–1.2)
GFR: 66.15 mL/min (ref >60.00)
Glucose, Bld: 143 mg/dL — ABNORMAL HIGH (ref 70–99)
Potassium: 3 mEq/L — ABNORMAL LOW (ref 3.5–5.1)
Sodium: 137 mEq/L (ref 135–145)
Total Bilirubin: 0.5 mg/dL (ref 0.3–1.2)
Total Protein: 6 g/dL (ref 6.0–8.3)

## 2012-12-16 LAB — MICROALBUMIN / CREATININE URINE RATIO
Creatinine,U: 34.5 mg/dL
Microalb Creat Ratio: 0.3 mg/g (ref 0.0–30.0)
Microalb, Ur: 0.1 mg/dL (ref 0.0–1.9)

## 2012-12-16 LAB — CBC WITH DIFFERENTIAL/PLATELET
Basophils Absolute: 0 10*3/uL (ref 0.0–0.1)
Basophils Relative: 0.6 % (ref 0.0–3.0)
Eosinophils Absolute: 0.1 10*3/uL (ref 0.0–0.7)
Eosinophils Relative: 1.3 % (ref 0.0–5.0)
HCT: 37.5 % (ref 36.0–46.0)
Hemoglobin: 12.8 g/dL (ref 12.0–15.0)
Lymphocytes Relative: 42.4 % (ref 12.0–46.0)
Lymphs Abs: 3.2 10*3/uL (ref 0.7–4.0)
MCHC: 34 g/dL (ref 30.0–36.0)
MCV: 91.5 fl (ref 78.0–100.0)
Monocytes Absolute: 0.7 10*3/uL (ref 0.1–1.0)
Monocytes Relative: 8.9 % (ref 3.0–12.0)
Neutro Abs: 3.5 10*3/uL (ref 1.4–7.7)
Neutrophils Relative %: 46.8 % (ref 43.0–77.0)
Platelets: 212 10*3/uL (ref 150.0–400.0)
RBC: 4.1 Mil/uL (ref 3.87–5.11)
RDW: 12.9 % (ref 11.5–14.6)
WBC: 7.6 10*3/uL (ref 4.5–10.5)

## 2012-12-16 LAB — PROTIME-INR
INR: 1.5 ratio — ABNORMAL HIGH (ref 0.8–1.0)
Prothrombin Time: 16 s — ABNORMAL HIGH (ref 10.2–12.4)

## 2012-12-16 LAB — TSH: TSH: 2.62 u[IU]/mL (ref 0.35–5.50)

## 2012-12-16 NOTE — Assessment & Plan Note (Signed)
Rate well controlled. Anticoagulated with coumadin, however INR has been quite variable. Will have her follow up with her cardiologist, Dr. Gwen Pounds. Question if she may be candidate for Xarelto or Eliquis.

## 2012-12-16 NOTE — Progress Notes (Signed)
Subjective:    Patient ID: Kara [name], female    DOB: 1922-08-07, 79 y.o.   MRN: [id number]  HPI 80 year old female with history of atrial fibrillation, chronic diarrhea presents for followup. Over the last several months her Coumadin level has been quite variable, at times supratherapeutic and at times subtherapeutic. She reports compliance with her Coumadin. She reports a consistent diet. However, she continues to have chronic watery diarrhea, typically several episodes per day. She uses Imodium on occasion with improvement in symptoms. This has previously been evaluated by her GI physician with no clear etiology found. She denies any recent fever, chills, nausea, vomiting. She notes some ongoing fatigue. She denies any chest pain, shortness of breath.  Outpatient Encounter Prescriptions as of 12/16/2012  Medication Sig Dispense Refill  . cholecalciferol (VITAMIN D) 1000 UNITS tablet Take 1,000 Units by mouth daily.      . cyanocobalamin (,VITAMIN B-12,) 1000 MCG/ML injection Inject 1 mL (1,000 mcg total) into the muscle every 30 (thirty) days.  10 mL  1  . diltiazem (CARDIZEM CD) 120 MG 24 hr capsule Take 1 capsule (120 mg total) by mouth daily.  30 capsule  6  . Dimethicone-Zinc Oxide-Vit A-D (A & D ZINC OXIDE) CREA Apply 1 application topically 2 (two) times daily as needed.  113 g  2  . hydrocortisone-pramoxine (ANALPRAM-HC SINGLES) 2.5-1 % rectal cream Place rectally 3 (three) times daily.  30 g  0  . Hypromellose (GENTEAL) 0.3 % SOLN Apply 1 drop to eye daily.      . metoprolol (LOPRESSOR) 50 MG tablet TAKE 1 TABLET (50 MG TOTAL) BY MOUTH 2 (TWO) TIMES DAILY.  60 tablet  5  . nystatin (MYCOSTATIN) powder Apply topically 4 (four) times daily as needed.      . nystatin-triamcinolone ointment (MYCOLOG) Apply topically 2 (two) times daily.  30 g  0  . warfarin (COUMADIN) 1 MG tablet Take 1 mg by mouth daily. Take 1 mg daily      . [DISCONTINUED] warfarin (COUMADIN) 1 MG tablet Take 1 mg and  1.5 mg alternating  45 tablet  5  . Calcium Carbonate-Vitamin D (CALCIUM-D) 600-400 MG-UNIT TABS Take 1 tablet by mouth daily.      . hydrocortisone-pramoxine (ANALPRAM-HC) 2.5-1 % rectal cream Place rectally 2 (two) times daily.  30 g  6  . lisinopril (PRINIVIL,ZESTRIL) 5 MG tablet Take 5 mg by mouth daily.      Marland Kitchen loperamide (IMODIUM) 2 MG capsule Take 2 mg by mouth every 8 (eight) hours as needed.      . pantoprazole (PROTONIX) 20 MG tablet Take 20 mg by mouth daily as needed.       . potassium chloride (MICRO-K) 10 MEQ CR capsule Take 1 capsule (10 mEq total) by mouth daily.  30 capsule  3  . spironolactone (ALDACTONE) 25 MG tablet       . triamterene-hydrochlorothiazide (MAXZIDE-25) 37.5-25 MG per tablet        No facility-administered encounter medications on file as of 12/16/2012.   BP 108/58  Pulse 61  Temp(Src) 98.2 F (36.8 C) (Oral)  Wt 127 lb (57.607 kg)  BMI 23.61 kg/m2  SpO2 97%  Review of Systems  Constitutional: Positive for fatigue. Negative for fever, chills, appetite change and unexpected weight change.  HENT: Negative for ear pain, congestion, sore throat, trouble swallowing, neck pain, voice change and sinus pressure.   Eyes: Negative for visual disturbance.  Respiratory: Negative for cough, shortness of breath, wheezing  and stridor.   Cardiovascular: Negative for chest pain, palpitations and leg swelling.  Gastrointestinal: Positive for diarrhea. Negative for nausea, vomiting, abdominal pain, constipation, blood in stool, abdominal distention and anal bleeding.  Genitourinary: Negative for dysuria and flank pain.  Musculoskeletal: Negative for myalgias, arthralgias and gait problem.  Skin: Negative for color change and rash.  Neurological: Negative for dizziness and headaches.  Hematological: Negative for adenopathy. Does not bruise/bleed easily.  Psychiatric/Behavioral: Negative for suicidal ideas, sleep disturbance and dysphoric mood. The patient is not  nervous/anxious.        Objective:   Physical Exam  Constitutional: She is oriented to person, place, and time. She appears well-developed and well-nourished. No distress.  HENT:  Head: Normocephalic and atraumatic.  Right Ear: External ear normal.  Left Ear: External ear normal.  Nose: Nose normal.  Mouth/Throat: Oropharynx is clear and moist. No oropharyngeal exudate.  Eyes: Conjunctivae are normal. Pupils are equal, round, and reactive to light. Right eye exhibits no discharge. Left eye exhibits no discharge. No scleral icterus.  Neck: Normal range of motion. Neck supple. No tracheal deviation present. No thyromegaly present.  Cardiovascular: Normal rate, normal heart sounds and intact distal pulses.  An irregularly irregular rhythm present. Exam reveals no gallop and no friction rub.   No murmur heard. Pulmonary/Chest: Effort normal and breath sounds normal. No accessory muscle usage. Not tachypneic. No respiratory distress. She has no decreased breath sounds. She has no wheezes. She has no rhonchi. She has no rales. She exhibits no tenderness.  Musculoskeletal: Normal range of motion. She exhibits no edema and no tenderness.  Lymphadenopathy:    She has no cervical adenopathy.  Neurological: She is alert and oriented to person, place, and time. No cranial nerve deficit. She exhibits normal muscle tone. Coordination normal.  Skin: Skin is warm and dry. No rash noted. She is not diaphoretic. No erythema. No pallor.  Psychiatric: She has a normal mood and affect. Her behavior is normal. Judgment and thought content normal.          Assessment & Plan:

## 2012-12-16 NOTE — Telephone Encounter (Signed)
In check out screen dr walker wanted Needs follow up with Dr. Gwen Pounds at [facility] asap Appointment with dr Gwen Pounds 12/24/12 @ 11:15 Pt aware of appointment

## 2012-12-16 NOTE — Assessment & Plan Note (Signed)
Persistent symptoms of fatigue. Labs are remarkable for hypokalemia. Suspect that chronic diarrhea is playing a role. Will check to make sure that she is taking potassium supplementation and will have her increase Immodium to 2mg  up to 3 times daily for diarrrhea.

## 2012-12-16 NOTE — Assessment & Plan Note (Signed)
Poor control of INR with coumadin. Will have her follow up with cardiology to see if she may be a candidate for Xarelto or Eliquis.

## 2012-12-16 NOTE — Assessment & Plan Note (Signed)
Persistent symptoms of diarrhea. Previous evaluation by GI physician unremarkable. Encouraged her to take daily probiotic. Encouraged her to use immodium more regularly, up to 2mg  3 times daily. Discussed adding Lomotil if no improvement.

## 2012-12-20 ENCOUNTER — Encounter: Payer: Self-pay | Admitting: *Deleted

## 2012-12-21 LAB — PROTIME-INR

## 2012-12-22 DIAGNOSIS — E876 Hypokalemia: Secondary | ICD-10-CM | POA: Diagnosis not present

## 2012-12-23 ENCOUNTER — Telehealth: Payer: Self-pay | Admitting: Internal Medicine

## 2012-12-23 NOTE — Telephone Encounter (Signed)
Labs show that blood sugars are elevated and potassium is low. This is unusual for her. I would like to bring her to the office to recheck a CMP and an A1c.  I think the labs may be abnormal because of hemolysis in the sample.

## 2012-12-23 NOTE — Telephone Encounter (Signed)
Patient informed and verbally agreed, however she would like to know if she could have her labs drawn at [facility] clinic?

## 2012-12-23 NOTE — Telephone Encounter (Signed)
No. I cannot order labs there. She should have them drawn in our office.

## 2012-12-24 DIAGNOSIS — R197 Diarrhea, unspecified: Secondary | ICD-10-CM | POA: Diagnosis not present

## 2012-12-24 DIAGNOSIS — I119 Hypertensive heart disease without heart failure: Secondary | ICD-10-CM | POA: Diagnosis not present

## 2012-12-24 DIAGNOSIS — I059 Rheumatic mitral valve disease, unspecified: Secondary | ICD-10-CM | POA: Diagnosis not present

## 2012-12-24 DIAGNOSIS — I4891 Unspecified atrial fibrillation: Secondary | ICD-10-CM | POA: Diagnosis not present

## 2012-12-24 NOTE — Telephone Encounter (Signed)
Patient informed and lab appointment scheduled for Monday

## 2012-12-27 ENCOUNTER — Encounter: Payer: Self-pay | Admitting: Internal Medicine

## 2012-12-27 ENCOUNTER — Other Ambulatory Visit: Payer: Medicare Other

## 2012-12-28 ENCOUNTER — Telehealth: Payer: Self-pay | Admitting: Internal Medicine

## 2012-12-28 LAB — PROTIME-INR

## 2012-12-28 NOTE — Telephone Encounter (Signed)
That is fine. Let's have her continue the same dose coumadin and repeat INR in 1 week.

## 2012-12-28 NOTE — Telephone Encounter (Signed)
Patient INR today was 1.9

## 2012-12-29 NOTE — Telephone Encounter (Signed)
Patient informed and verbalized understanding

## 2012-12-31 ENCOUNTER — Encounter: Payer: Self-pay | Admitting: Internal Medicine

## 2013-01-04 LAB — PROTIME-INR

## 2013-01-05 ENCOUNTER — Encounter: Payer: Self-pay | Admitting: Internal Medicine

## 2013-01-05 ENCOUNTER — Telehealth: Payer: Self-pay | Admitting: Internal Medicine

## 2013-01-05 NOTE — Telephone Encounter (Signed)
INR was low at 1.5 on 01/04/2013. Can you please confirm pt dose of coumadin?

## 2013-01-05 NOTE — Telephone Encounter (Signed)
Left message to call back  

## 2013-01-06 ENCOUNTER — Encounter: Payer: Self-pay | Admitting: Internal Medicine

## 2013-01-06 ENCOUNTER — Ambulatory Visit (INDEPENDENT_AMBULATORY_CARE_PROVIDER_SITE_OTHER): Payer: Medicare Other | Admitting: Internal Medicine

## 2013-01-06 VITALS — BP 102/50 | HR 61 | Temp 98.3°F | Wt 126.0 lb

## 2013-01-06 DIAGNOSIS — I4891 Unspecified atrial fibrillation: Secondary | ICD-10-CM

## 2013-01-06 DIAGNOSIS — Z7901 Long term (current) use of anticoagulants: Secondary | ICD-10-CM

## 2013-01-06 DIAGNOSIS — R197 Diarrhea, unspecified: Secondary | ICD-10-CM | POA: Diagnosis not present

## 2013-01-06 DIAGNOSIS — K529 Noninfective gastroenteritis and colitis, unspecified: Secondary | ICD-10-CM

## 2013-01-06 MED ORDER — DIPHENOXYLATE-ATROPINE 2.5-0.025 MG PO TABS: 1.0000 | ORAL_TABLET | Freq: Four times a day (QID) | ORAL | Status: DC | PRN

## 2013-01-06 NOTE — Progress Notes (Signed)
Subjective:    Patient ID: Kara [name], female    DOB: 12-Oct-1922, 79 y.o.   MRN: [id number]  HPI 79 year old female with history of atrial fibrillation, chronic diarrhea secondary to irritable bowel syndrome presents for followup. At her last visit, we discussed changing her chronic anticoagulation from Coumadin to an alternative agent because of wide. He shouldn't and INR levels on minimal dosing of Coumadin. Patient reports she discussed this with her cardiologist and he recommended staying on Coumadin. Last INR, this week was 1.5. She denies any easy bleeding or bruising. She is compliant with her medication.  In regards to history of chronic diarrhea, she reports symptoms are relatively unchanged. She continues to have intermittent episodes of watery stools and crampy abdominal pain. She gets some improvement with use of Imodium. In the past, she had used Lomotil with better control of symptoms. Should like to restart this medication. She denies any recent fever, chills, bloody stools, nausea, change in appetite.  Outpatient Encounter Prescriptions as of 01/06/2013  Medication Sig Dispense Refill  . Calcium Carbonate-Vitamin D (CALCIUM-D) 600-400 MG-UNIT TABS Take 1 tablet by mouth daily.      . cholecalciferol (VITAMIN D) 1000 UNITS tablet Take 1,000 Units by mouth daily.      . cyanocobalamin (,VITAMIN B-12,) 1000 MCG/ML injection Inject 1 mL (1,000 mcg total) into the muscle every 30 (thirty) days.  10 mL  1  . diltiazem (CARDIZEM CD) 120 MG 24 hr capsule Take 1 capsule (120 mg total) by mouth daily.  30 capsule  6  . Dimethicone-Zinc Oxide-Vit A-D (A & D ZINC OXIDE) CREA Apply 1 application topically 2 (two) times daily as needed.  113 g  2  . hydrocortisone-pramoxine (ANALPRAM-HC SINGLES) 2.5-1 % rectal cream Place rectally 3 (three) times daily.  30 g  0  . Hypromellose (GENTEAL) 0.3 % SOLN Apply 1 drop to eye daily.      Marland Kitchen loperamide (IMODIUM) 2 MG capsule Take 2 mg by mouth every 8  (eight) hours as needed.      . metoprolol (LOPRESSOR) 50 MG tablet Take 50 mg by mouth 2 (two) times daily. Take 1/2 tablet by mouth two times a day      . potassium chloride (MICRO-K) 10 MEQ CR capsule Take 1 capsule (10 mEq total) by mouth daily.  30 capsule  3  . triamterene-hydrochlorothiazide (MAXZIDE-25) 37.5-25 MG per tablet       . warfarin (COUMADIN) 1 MG tablet Take 1 mg by mouth daily. Take 1 mg daily      . diphenoxylate-atropine (LOMOTIL) 2.5-0.025 MG per tablet Take 1 tablet by mouth 4 (four) times daily as needed for diarrhea or loose stools.  90 tablet  1  . hydrocortisone-pramoxine (ANALPRAM-HC) 2.5-1 % rectal cream Place rectally 2 (two) times daily.  30 g  6  . lisinopril (PRINIVIL,ZESTRIL) 5 MG tablet Take 5 mg by mouth daily.      Marland Kitchen nystatin (MYCOSTATIN) powder Apply topically 4 (four) times daily as needed.      . pantoprazole (PROTONIX) 20 MG tablet Take 20 mg by mouth daily as needed.       Marland Kitchen spironolactone (ALDACTONE) 25 MG tablet       . [DISCONTINUED] metoprolol (LOPRESSOR) 50 MG tablet TAKE 1 TABLET (50 MG TOTAL) BY MOUTH 2 (TWO) TIMES DAILY.  60 tablet  5   No facility-administered encounter medications on file as of 01/06/2013.   BP 102/50  Pulse 61  Temp(Src) 98.3 F (36.8  C) (Oral)  Wt 126 lb (57.153 kg)  BMI 23.42 kg/m2  SpO2 97%  Review of Systems  Constitutional: Negative for fever, chills, appetite change, fatigue and unexpected weight change.  HENT: Negative for ear pain, congestion, sore throat, trouble swallowing, neck pain, voice change and sinus pressure.   Eyes: Negative for visual disturbance.  Respiratory: Negative for cough, shortness of breath, wheezing and stridor.   Cardiovascular: Negative for chest pain, palpitations and leg swelling.  Gastrointestinal: Positive for diarrhea. Negative for nausea, vomiting, abdominal pain, constipation, blood in stool, abdominal distention and anal bleeding.  Genitourinary: Negative for dysuria and flank  pain.  Musculoskeletal: Negative for myalgias, arthralgias and gait problem.  Skin: Negative for color change and rash.  Neurological: Negative for dizziness and headaches.  Hematological: Negative for adenopathy. Does not bruise/bleed easily.  Psychiatric/Behavioral: Negative for suicidal ideas, sleep disturbance and dysphoric mood. The patient is not nervous/anxious.        Objective:   Physical Exam  Constitutional: She is oriented to person, place, and time. She appears well-developed and well-nourished. No distress.  HENT:  Head: Normocephalic and atraumatic.  Right Ear: External ear normal.  Left Ear: External ear normal.  Nose: Nose normal.  Mouth/Throat: Oropharynx is clear and moist. No oropharyngeal exudate.  Eyes: Conjunctivae are normal. Pupils are equal, round, and reactive to light. Right eye exhibits no discharge. Left eye exhibits no discharge. No scleral icterus.  Neck: Normal range of motion. Neck supple. No tracheal deviation present. No thyromegaly present.  Cardiovascular: Normal rate, regular rhythm, normal heart sounds and intact distal pulses.  Exam reveals no gallop and no friction rub.   No murmur heard. Pulmonary/Chest: Effort normal and breath sounds normal. No accessory muscle usage. Not tachypneic. No respiratory distress. She has no decreased breath sounds. She has no wheezes. She has no rhonchi. She has no rales. She exhibits no tenderness.  Abdominal: Soft. Bowel sounds are normal. She exhibits no distension and no mass. There is no tenderness. There is no rebound and no guarding.  Musculoskeletal: Normal range of motion. She exhibits no edema and no tenderness.  Lymphadenopathy:    She has no cervical adenopathy.  Neurological: She is alert and oriented to person, place, and time. No cranial nerve deficit. She exhibits normal muscle tone. Coordination normal.  Skin: Skin is warm and dry. No rash noted. She is not diaphoretic. No erythema. No pallor.   Psychiatric: She has a normal mood and affect. Her behavior is normal. Judgment and thought content normal.          Assessment & Plan:

## 2013-01-06 NOTE — Patient Instructions (Signed)
Please increase Coumadin to 1mg  alternating with 1.5mg  daily. Recheck INR next week.

## 2013-01-06 NOTE — Assessment & Plan Note (Signed)
Recent INR has been low. Patient reports she discussed with her cardiologist who recommended staying on Coumadin rather than trying Xarelto or Eliquis. Will increase Coumadin to 1 mg alternating with 1.5 mg daily. Repeat INR next week.

## 2013-01-06 NOTE — Telephone Encounter (Signed)
Patient came in for her appointment today, has been taking 1 mg. Please refer to visit notes for further information.

## 2013-01-06 NOTE — Assessment & Plan Note (Signed)
Symptomatically doing well. Rate well controlled with diltiazem and metoprolol. Followed by local cardiologist. Anticoagulated with Coumadin.

## 2013-01-06 NOTE — Assessment & Plan Note (Signed)
Symptoms of chronic diarrhea are unchanged. Will try adding Lomotil to help with symptoms.

## 2013-01-11 DIAGNOSIS — I4891 Unspecified atrial fibrillation: Secondary | ICD-10-CM | POA: Diagnosis not present

## 2013-01-11 LAB — PROTIME-INR

## 2013-01-18 LAB — PROTIME-INR

## 2013-01-24 ENCOUNTER — Telehealth: Payer: Self-pay | Admitting: Internal Medicine

## 2013-01-24 NOTE — Telephone Encounter (Signed)
Left message to call back  

## 2013-01-24 NOTE — Telephone Encounter (Signed)
Pt cannot remember what her dosage is for Coumadin.  Please advise.

## 2013-01-25 ENCOUNTER — Encounter: Payer: Self-pay | Admitting: Internal Medicine

## 2013-01-25 LAB — PROTIME-INR: INR: 2.7 — AB (ref 0.9–1.1)

## 2013-01-25 NOTE — Telephone Encounter (Signed)
Called and spoke with patient again today, verbalized understanding of directions of coumadin. She is suppose to take 1 mg and alternate days with 1.5 mg. She was just a little confused with her medications because of the way it looked.

## 2013-02-01 LAB — PROTIME-INR

## 2013-02-03 ENCOUNTER — Encounter: Payer: Self-pay | Admitting: Internal Medicine

## 2013-02-04 ENCOUNTER — Encounter: Payer: Self-pay | Admitting: Internal Medicine

## 2013-02-08 DIAGNOSIS — I4891 Unspecified atrial fibrillation: Secondary | ICD-10-CM | POA: Diagnosis not present

## 2013-02-08 LAB — PROTIME-INR: INR: 2.7 — AB (ref 0.9–1.1)

## 2013-02-15 ENCOUNTER — Encounter: Payer: Self-pay | Admitting: Internal Medicine

## 2013-02-15 LAB — PROTIME-INR: INR: 2.9 — AB (ref 0.9–1.1)

## 2013-02-21 ENCOUNTER — Encounter: Payer: Self-pay | Admitting: Internal Medicine

## 2013-02-22 LAB — PROTIME-INR

## 2013-02-28 ENCOUNTER — Encounter: Payer: Self-pay | Admitting: Internal Medicine

## 2013-02-28 DIAGNOSIS — Z23 Encounter for immunization: Secondary | ICD-10-CM | POA: Diagnosis not present

## 2013-03-01 LAB — PROTIME-INR: INR: 2.7 — AB (ref 0.9–1.1)

## 2013-03-08 DIAGNOSIS — I4891 Unspecified atrial fibrillation: Secondary | ICD-10-CM | POA: Diagnosis not present

## 2013-03-08 LAB — PROTIME-INR: INR: 1 (ref 0.9–1.1)

## 2013-03-09 ENCOUNTER — Telehealth: Payer: Self-pay | Admitting: *Deleted

## 2013-03-09 NOTE — Telephone Encounter (Signed)
MD INR left a message on voicemail stating the patient INR is out of range. It was 1.0

## 2013-03-09 NOTE — Telephone Encounter (Signed)
Please have them repeat this. It does not make sense based on her recent values.

## 2013-03-10 LAB — PROTIME-INR: INR: 2 — AB (ref 0.9–1.1)

## 2013-03-10 NOTE — Telephone Encounter (Signed)
Patient informed, she will repeat today and call it in as usual.

## 2013-03-12 ENCOUNTER — Other Ambulatory Visit: Payer: Self-pay | Admitting: Internal Medicine

## 2013-03-14 ENCOUNTER — Encounter: Payer: Self-pay | Admitting: Internal Medicine

## 2013-03-15 ENCOUNTER — Encounter: Payer: Self-pay | Admitting: Internal Medicine

## 2013-03-15 LAB — PROTIME-INR: INR: 2.3 — AB (ref 0.9–1.1)

## 2013-03-22 LAB — PROTIME-INR: INR: 2.6 — AB (ref 0.9–1.1)

## 2013-03-23 ENCOUNTER — Other Ambulatory Visit: Payer: Self-pay | Admitting: Internal Medicine

## 2013-03-28 ENCOUNTER — Encounter: Payer: Self-pay | Admitting: Internal Medicine

## 2013-03-29 LAB — PROTIME-INR: INR: 3.2 — AB (ref 0.9–1.1)

## 2013-03-30 ENCOUNTER — Encounter: Payer: Self-pay | Admitting: Internal Medicine

## 2013-03-30 ENCOUNTER — Telehealth: Payer: Self-pay | Admitting: *Deleted

## 2013-03-30 NOTE — Telephone Encounter (Signed)
Received fax from [facility] and patient's INR was 3.2 on 03/29/2013

## 2013-03-30 NOTE — Telephone Encounter (Signed)
Patient informed and verbalized understanding. Will recheck tomorrow.

## 2013-03-30 NOTE — Telephone Encounter (Signed)
Can we just have her continue current dose coumadin and recheck on Thursday? Her INR has been very stable

## 2013-03-31 LAB — PROTIME-INR: INR: 2.7 — AB (ref 0.9–1.1)

## 2013-04-04 ENCOUNTER — Encounter: Payer: Self-pay | Admitting: Internal Medicine

## 2013-04-05 DIAGNOSIS — I4891 Unspecified atrial fibrillation: Secondary | ICD-10-CM | POA: Diagnosis not present

## 2013-04-05 LAB — PROTIME-INR: INR: 2.1 — AB (ref 0.9–1.1)

## 2013-04-08 ENCOUNTER — Encounter: Payer: Self-pay | Admitting: Internal Medicine

## 2013-04-12 ENCOUNTER — Encounter: Payer: Self-pay | Admitting: Internal Medicine

## 2013-04-12 ENCOUNTER — Ambulatory Visit (INDEPENDENT_AMBULATORY_CARE_PROVIDER_SITE_OTHER): Payer: Medicare Other | Admitting: Internal Medicine

## 2013-04-12 ENCOUNTER — Other Ambulatory Visit: Payer: Self-pay | Admitting: Internal Medicine

## 2013-04-12 VITALS — BP 112/60 | HR 62 | Temp 98.2°F | Wt 126.0 lb

## 2013-04-12 DIAGNOSIS — I4891 Unspecified atrial fibrillation: Secondary | ICD-10-CM | POA: Diagnosis not present

## 2013-04-12 DIAGNOSIS — E876 Hypokalemia: Secondary | ICD-10-CM | POA: Diagnosis not present

## 2013-04-12 DIAGNOSIS — Z7901 Long term (current) use of anticoagulants: Secondary | ICD-10-CM | POA: Diagnosis not present

## 2013-04-12 LAB — PROTIME-INR

## 2013-04-12 MED ORDER — CYANOCOBALAMIN 1000 MCG/ML IJ SOLN: 1000.0000 ug | INTRAMUSCULAR | Status: DC

## 2013-04-12 MED ORDER — POTASSIUM CHLORIDE ER 10 MEQ PO CPCR: 10.0000 meq | ORAL_CAPSULE | Freq: Every day | ORAL | Status: DC

## 2013-04-12 NOTE — Progress Notes (Signed)
Pre-visit discussion using our clinic review tool. No additional management support is needed unless otherwise documented below in the visit note.  

## 2013-04-12 NOTE — Assessment & Plan Note (Signed)
Continue home monitoring with [facility]INR. Continue current dose of coumadin. INR check today.

## 2013-04-12 NOTE — Assessment & Plan Note (Signed)
Will check electrolytes with labs today. 

## 2013-04-12 NOTE — Assessment & Plan Note (Signed)
Appears to be back in NSR on exam today. Continue Diltiazem and Metoprolol for rate control. If recurrent episodes of palpitations, would consider increasing dose of Metoprolol. Pt will call if symptoms recurrent. Will continue Warfarin anticoagulation. Pt monitor's INR at home with [facility]. INRs have been therapeutic. Follow up 3 months and prn.

## 2013-04-12 NOTE — Telephone Encounter (Signed)
Will you clarify her directions at her appointment today?

## 2013-04-12 NOTE — Progress Notes (Signed)
Subjective:    Patient ID: Kara [name], female    DOB: 09/17/1922, 79 y.o.   MRN: [id number]  HPI 79 year old female with history of atrial fibrillation on chronic anticoagulation presents for followup. She reports that yesterday with difficulty for her with frequent palpitations. She rested in her symptoms have improved. She denies any palpitations today. She denies any chest pain, shortness of breath. She is compliant with medications. Coumadin levels have been therapeutic. She denies any easy bleeding or bruising. She denies any new concerns today.  Outpatient Encounter Prescriptions as of 04/12/2013  Medication Sig  . Calcium Carbonate-Vitamin D (CALCIUM-D) 600-400 MG-UNIT TABS Take 1 tablet by mouth daily.  . cholecalciferol (VITAMIN D) 1000 UNITS tablet Take 1,000 Units by mouth daily.  Marland Kitchen diltiazem (CARDIZEM CD) 120 MG 24 hr capsule Take 1 capsule (120 mg total) by mouth daily.  . Dimethicone-Zinc Oxide-Vit A-D (A & D ZINC OXIDE) CREA Apply 1 application topically 2 (two) times daily as needed.  . diphenoxylate-atropine (LOMOTIL) 2.5-0.025 MG per tablet TAKE 1 TABLET FOUR TIMES DAILY AS NEEDED FOR DIARRHEA OR LOOSE STOOLS  . hydrocortisone-pramoxine (ANALPRAM-HC SINGLES) 2.5-1 % rectal cream Place rectally 3 (three) times daily.  . hydrocortisone-pramoxine (ANALPRAM-HC) 2.5-1 % rectal cream Place rectally 2 (two) times daily.  . Hypromellose (GENTEAL) 0.3 % SOLN Apply 1 drop to eye daily.  . metoprolol (LOPRESSOR) 50 MG tablet Take 50 mg by mouth 2 (two) times daily. Take 1/2 tablet by mouth two times a day  . nystatin (MYCOSTATIN) powder Apply topically 4 (four) times daily as needed.  . potassium chloride (MICRO-K) 10 MEQ CR capsule Take 1 capsule (10 mEq total) by mouth daily.  Marland Kitchen triamterene-hydrochlorothiazide (MAXZIDE-25) 37.5-25 MG per tablet   . warfarin (COUMADIN) 1 MG tablet Take 1 mg by mouth daily. Take 1 mg every other day and 1.5 mg all other days  . cyanocobalamin (,VITAMIN  B-12,) 1000 MCG/ML injection Inject 1 mL (1,000 mcg total) into the muscle every 30 (thirty) days.  Marland Kitchen lisinopril (PRINIVIL,ZESTRIL) 5 MG tablet Take 5 mg by mouth daily.  Marland Kitchen loperamide (IMODIUM) 2 MG capsule Take 2 mg by mouth every 8 (eight) hours as needed.  . pantoprazole (PROTONIX) 20 MG tablet Take 20 mg by mouth daily as needed.   Marland Kitchen spironolactone (ALDACTONE) 25 MG tablet    BP 112/60  Pulse 62  Temp(Src) 98.2 F (36.8 C) (Oral)  Wt 126 lb (57.153 kg)  SpO2 96%  Review of Systems  Constitutional: Negative for fever, chills, appetite change, fatigue and unexpected weight change.  HENT: Negative for congestion, ear pain, sinus pressure, sore throat, trouble swallowing and voice change.   Eyes: Negative for visual disturbance.  Respiratory: Negative for cough, shortness of breath, wheezing and stridor.   Cardiovascular: Positive for palpitations. Negative for chest pain and leg swelling.  Gastrointestinal: Negative for nausea, vomiting, abdominal pain, diarrhea, constipation, blood in stool, abdominal distention and anal bleeding.  Genitourinary: Negative for dysuria and flank pain.  Musculoskeletal: Negative for arthralgias, gait problem, myalgias and neck pain.  Skin: Negative for color change and rash.  Neurological: Negative for dizziness and headaches.  Hematological: Negative for adenopathy. Does not bruise/bleed easily.  Psychiatric/Behavioral: Negative for suicidal ideas, sleep disturbance and dysphoric mood. The patient is not nervous/anxious.        Objective:   Physical Exam  Constitutional: She is oriented to person, place, and time. She appears well-developed and well-nourished. No distress.  HENT:  Head: Normocephalic and atraumatic.  Right Ear: External ear normal.  Left Ear: External ear normal.  Nose: Nose normal.  Mouth/Throat: Oropharynx is clear and moist. No oropharyngeal exudate.  Eyes: Conjunctivae are normal. Pupils are equal, round, and reactive to  light. Right eye exhibits no discharge. Left eye exhibits no discharge. No scleral icterus.  Neck: Normal range of motion. Neck supple. No tracheal deviation present. No thyromegaly present.  Cardiovascular: Normal rate, regular rhythm, normal heart sounds and intact distal pulses.  Exam reveals no gallop and no friction rub.   No murmur heard. Pulmonary/Chest: Effort normal and breath sounds normal. No accessory muscle usage. Not tachypneic. No respiratory distress. She has no decreased breath sounds. She has no wheezes. She has no rhonchi. She has no rales. She exhibits no tenderness.  Musculoskeletal: Normal range of motion. She exhibits no edema and no tenderness.  Lymphadenopathy:    She has no cervical adenopathy.  Neurological: She is alert and oriented to person, place, and time. No cranial nerve deficit. She exhibits normal muscle tone. Coordination normal.  Skin: Skin is warm and dry. No rash noted. She is not diaphoretic. No erythema. No pallor.  Psychiatric: She has a normal mood and affect. Her behavior is normal. Judgment and thought content normal.          Assessment & Plan:

## 2013-04-12 NOTE — Telephone Encounter (Signed)
She would like to leave the directions as stated even though she is not taking it that way.

## 2013-04-12 NOTE — Telephone Encounter (Signed)
Eprescribed.

## 2013-04-13 ENCOUNTER — Telehealth: Payer: Self-pay | Admitting: *Deleted

## 2013-04-13 NOTE — Telephone Encounter (Signed)
Patient INR is Low, it was  1.9

## 2013-04-13 NOTE — Telephone Encounter (Signed)
That is okay. It is very close to therapeutic and she checks it at home weekly. I would continue current dose and recheck on Tuesday of next week.

## 2013-04-13 NOTE — Telephone Encounter (Signed)
Left message to call back  

## 2013-04-13 NOTE — Telephone Encounter (Signed)
Patient informed and verbalized understanding

## 2013-04-19 LAB — PROTIME-INR: INR: 2.3 — AB (ref 0.9–1.1)

## 2013-04-20 ENCOUNTER — Telehealth: Payer: Self-pay | Admitting: *Deleted

## 2013-04-20 NOTE — Telephone Encounter (Signed)
Patient INR on 04/19/2013 was 2.3.  INR 2.3

## 2013-04-25 ENCOUNTER — Encounter: Payer: Self-pay | Admitting: Internal Medicine

## 2013-04-26 ENCOUNTER — Encounter: Payer: Self-pay | Admitting: Internal Medicine

## 2013-04-26 LAB — PROTIME-INR

## 2013-04-29 ENCOUNTER — Encounter: Payer: Self-pay | Admitting: Internal Medicine

## 2013-05-02 DIAGNOSIS — E785 Hyperlipidemia, unspecified: Secondary | ICD-10-CM | POA: Diagnosis not present

## 2013-05-02 DIAGNOSIS — I10 Essential (primary) hypertension: Secondary | ICD-10-CM | POA: Diagnosis not present

## 2013-05-02 DIAGNOSIS — Z7901 Long term (current) use of anticoagulants: Secondary | ICD-10-CM | POA: Diagnosis not present

## 2013-05-02 DIAGNOSIS — I4891 Unspecified atrial fibrillation: Secondary | ICD-10-CM | POA: Diagnosis not present

## 2013-05-03 DIAGNOSIS — I4891 Unspecified atrial fibrillation: Secondary | ICD-10-CM | POA: Diagnosis not present

## 2013-05-03 LAB — PROTIME-INR

## 2013-05-05 ENCOUNTER — Encounter: Payer: Self-pay | Admitting: Internal Medicine

## 2013-05-05 DIAGNOSIS — I10 Essential (primary) hypertension: Secondary | ICD-10-CM | POA: Diagnosis not present

## 2013-05-05 DIAGNOSIS — I4891 Unspecified atrial fibrillation: Secondary | ICD-10-CM | POA: Diagnosis not present

## 2013-05-10 LAB — PROTIME-INR: INR: 1.4 — AB (ref 0.9–1.1)

## 2013-05-16 ENCOUNTER — Encounter: Payer: Self-pay | Admitting: Internal Medicine

## 2013-05-17 LAB — PROTIME-INR: INR: 1.6 — AB (ref 0.9–1.1)

## 2013-05-19 HISTORY — PX: COLECTOMY: SHX59

## 2013-05-20 ENCOUNTER — Telehealth: Payer: Self-pay | Admitting: Internal Medicine

## 2013-05-20 NOTE — Telephone Encounter (Signed)
Coumadin level from 12/30 was low at 1.6. Can you confirm that she has been taking her medication? We should repeat INR Monday.

## 2013-05-23 LAB — PROTIME-INR

## 2013-05-23 NOTE — Telephone Encounter (Signed)
OK. Let's see what it is today and then decide about changing dose.

## 2013-05-23 NOTE — Telephone Encounter (Signed)
Spoke with patient, informed her INR was low. Per patient she state she has been taking her coumadin 1.5 mg and 1 mg alternating between the two dosage. She has 1 mg tablets at home and she will recheck it today as directed.

## 2013-05-24 NOTE — Telephone Encounter (Signed)
Left message to call back  

## 2013-05-24 NOTE — Telephone Encounter (Signed)
Coumadin level was at goal 05/23/2013. I would like to continue her on same regimen, repeat INR in 1 week.

## 2013-05-24 NOTE — Telephone Encounter (Signed)
Patient informed and verbally agreed.  

## 2013-05-31 DIAGNOSIS — I4891 Unspecified atrial fibrillation: Secondary | ICD-10-CM | POA: Diagnosis not present

## 2013-05-31 LAB — PROTIME-INR: INR: 2.5 — AB (ref 0.9–1.1)

## 2013-06-02 DIAGNOSIS — L821 Other seborrheic keratosis: Secondary | ICD-10-CM | POA: Diagnosis not present

## 2013-06-02 DIAGNOSIS — D485 Neoplasm of uncertain behavior of skin: Secondary | ICD-10-CM | POA: Diagnosis not present

## 2013-06-02 DIAGNOSIS — L570 Actinic keratosis: Secondary | ICD-10-CM | POA: Diagnosis not present

## 2013-06-02 DIAGNOSIS — Z85828 Personal history of other malignant neoplasm of skin: Secondary | ICD-10-CM | POA: Diagnosis not present

## 2013-06-03 ENCOUNTER — Encounter: Payer: Self-pay | Admitting: Internal Medicine

## 2013-06-07 LAB — PROTIME-INR: INR: 2.7 — AB (ref 0.9–1.1)

## 2013-06-14 LAB — PROTIME-INR

## 2013-06-16 ENCOUNTER — Telehealth: Payer: Self-pay | Admitting: *Deleted

## 2013-06-16 DIAGNOSIS — Z7901 Long term (current) use of anticoagulants: Secondary | ICD-10-CM

## 2013-06-16 NOTE — Assessment & Plan Note (Signed)
Her INR is low at 1.8  Current dose is

## 2013-06-16 NOTE — Telephone Encounter (Signed)
Ok,  Since she missed a day,  Continue previous regimen and repeat in 1 week

## 2013-06-16 NOTE — Telephone Encounter (Signed)
Spoke with patient about her coumadin, she realized she skipped a night this week. She did not take her medication on Sunday night. Per patient she has been alternating taking 1.5 mg and 1 mg, Sunday night was supposed to have been 1 mg. Patient will take coumadin as she regular does tonight until I return her call tomorrow since she missed a day.

## 2013-06-16 NOTE — Telephone Encounter (Signed)
This message should have been routed to a provider since Dr. Gilford Rile is out of the office and I am in the lab. Message fwd to Dr. Derrel Nip

## 2013-06-16 NOTE — Telephone Encounter (Signed)
Coumadin level is low.  Please increase dose to 1.5 mg daily and repeat in 1 week

## 2013-06-16 NOTE — Telephone Encounter (Signed)
INR-1.8 on 06/14/13 (should have received a fax also)

## 2013-06-17 NOTE — Telephone Encounter (Signed)
Pt called to inform that she did receive the msg left yesterday.

## 2013-06-20 ENCOUNTER — Encounter: Payer: Self-pay | Admitting: Internal Medicine

## 2013-06-21 LAB — PROTIME-INR

## 2013-06-29 ENCOUNTER — Encounter: Payer: Self-pay | Admitting: Internal Medicine

## 2013-06-29 DIAGNOSIS — I4891 Unspecified atrial fibrillation: Secondary | ICD-10-CM | POA: Diagnosis not present

## 2013-06-29 LAB — PROTIME-INR: INR: 2.6 — AB (ref 0.9–1.1)

## 2013-07-04 ENCOUNTER — Ambulatory Visit (INDEPENDENT_AMBULATORY_CARE_PROVIDER_SITE_OTHER): Payer: Medicare Other | Admitting: Internal Medicine

## 2013-07-04 ENCOUNTER — Encounter: Payer: Self-pay | Admitting: Internal Medicine

## 2013-07-04 VITALS — BP 100/54 | HR 61 | Temp 97.7°F | Wt 125.0 lb

## 2013-07-04 DIAGNOSIS — I4891 Unspecified atrial fibrillation: Secondary | ICD-10-CM | POA: Diagnosis not present

## 2013-07-04 DIAGNOSIS — R5383 Other fatigue: Secondary | ICD-10-CM

## 2013-07-04 DIAGNOSIS — R5381 Other malaise: Secondary | ICD-10-CM

## 2013-07-04 DIAGNOSIS — K529 Noninfective gastroenteritis and colitis, unspecified: Secondary | ICD-10-CM

## 2013-07-04 DIAGNOSIS — R197 Diarrhea, unspecified: Secondary | ICD-10-CM | POA: Diagnosis not present

## 2013-07-04 NOTE — Assessment & Plan Note (Signed)
NSR on exam today, however recent episode of described palpitations earlier today likely AFIB. Will continue Diltiazem and Metoprolol. Continue Warfarin. Will check INR with labs.

## 2013-07-04 NOTE — Progress Notes (Signed)
Pre-visit discussion using our clinic review tool. No additional management support is needed unless otherwise documented below in the visit note.  

## 2013-07-04 NOTE — Assessment & Plan Note (Signed)
Likely multifactorial with chronic diarrhea, AFIB. Will check CBC, CMP with labs.

## 2013-07-04 NOTE — Progress Notes (Signed)
Subjective:    Patient ID: Kara [name], female    DOB: May 13, 1923, 80 y.o.   MRN: [id number]  HPI 80YO female presents for follow up. Feeling "tired" today. Had some irregular palpitations this morning, so took her Diltiazem. Palpitations have stopped. No chest pain or dypsnea.  Stopped taking potassium because concerned about diarrhea being worse with this supplement. Symptoms improved with stopping the medication.  Pt has been monitoring INR at home using [place].  Review of Systems  Constitutional: Positive for fatigue. Negative for fever, chills, appetite change and unexpected weight change.  HENT: Negative for congestion, ear pain, sinus pressure, sore throat, trouble swallowing and voice change.   Eyes: Negative for visual disturbance.  Respiratory: Negative for cough, shortness of breath, wheezing and stridor.   Cardiovascular: Positive for palpitations. Negative for chest pain and leg swelling.  Gastrointestinal: Positive for diarrhea (chronic). Negative for nausea, vomiting, abdominal pain, constipation, blood in stool, abdominal distention and anal bleeding.  Genitourinary: Negative for dysuria and flank pain.  Musculoskeletal: Negative for arthralgias, gait problem, myalgias and neck pain.  Skin: Negative for color change and rash.  Neurological: Negative for dizziness and headaches.  Hematological: Negative for adenopathy. Does not bruise/bleed easily.  Psychiatric/Behavioral: Negative for suicidal ideas, sleep disturbance and dysphoric mood. The patient is not nervous/anxious.        Objective:    BP 100/54  Pulse 61  Temp(Src) 97.7 F (36.5 C) (Oral)  Wt 125 lb (56.7 kg)  SpO2 95% Physical Exam  Constitutional: She is oriented to person, place, and time. She appears well-developed and well-nourished. No distress.  HENT:  Head: Normocephalic and atraumatic.  Right Ear: External ear normal.  Left Ear: External ear normal.  Nose: Nose normal.  Mouth/Throat:  Oropharynx is clear and moist. No oropharyngeal exudate.  Eyes: Conjunctivae are normal. Pupils are equal, round, and reactive to light. Right eye exhibits no discharge. Left eye exhibits no discharge. No scleral icterus.  Neck: Normal range of motion. Neck supple. No tracheal deviation present. No thyromegaly present.  Cardiovascular: Normal rate, regular rhythm, normal heart sounds and intact distal pulses.   Extrasystoles are present. Exam reveals no gallop and no friction rub.   No murmur heard. Pulmonary/Chest: Effort normal and breath sounds normal. No accessory muscle usage. Not tachypneic. No respiratory distress. She has no decreased breath sounds. She has no wheezes. She has no rhonchi. She has no rales. She exhibits no tenderness.  Musculoskeletal: Normal range of motion. She exhibits no edema and no tenderness.  Lymphadenopathy:    She has no cervical adenopathy.  Neurological: She is alert and oriented to person, place, and time. No cranial nerve deficit. She exhibits normal muscle tone. Coordination normal.  Skin: Skin is warm and dry. No rash noted. She is not diaphoretic. No erythema. No pallor.  Psychiatric: She has a normal mood and affect. Her behavior is normal. Judgment and thought content normal.          Assessment & Plan:   Problem List Items Addressed This Visit   Atrial fibrillation - Primary     NSR on exam today, however recent episode of described palpitations earlier today likely AFIB. Will continue Diltiazem and Metoprolol. Continue Warfarin. Will check INR with labs.    Chronic diarrhea     Symptoms recently exacerbated by potassium supplement. Will check electrolytes with labs. Continue prn lomotil.    Fatigue     Likely multifactorial with chronic diarrhea, AFIB. Will check CBC, CMP  with labs.        Return in about 3 months (around 10/01/2013).

## 2013-07-04 NOTE — Assessment & Plan Note (Signed)
Symptoms recently exacerbated by potassium supplement. Will check electrolytes with labs. Continue prn lomotil.

## 2013-07-05 LAB — PROTIME-INR

## 2013-07-06 ENCOUNTER — Telehealth: Payer: Self-pay | Admitting: *Deleted

## 2013-07-06 NOTE — Telephone Encounter (Signed)
There has been some variability with the readings with [facility]. I would recommend repeat INR today or tomorrow

## 2013-07-06 NOTE — Telephone Encounter (Signed)
Dr. Nicki Reaper addressed this, she will recheck it tomorrow and take 2 mg tonight instead of 1.5 mg. Patient informed and verbalized understanding.

## 2013-07-06 NOTE — Telephone Encounter (Signed)
I had talked to Kara [name] about this in the office.  I reviewed her readings and it appears that they vary.  I spoke to pt and she reported doing fine and that she had not missed any coumadin doses recently.  She was due to take 1.5mg  tonight.  I asked her to take 2mg  tonight and recheck her level in the am.  I told her that we would let her know tomorrow what to do about her coumadin dosing.

## 2013-07-06 NOTE — Telephone Encounter (Signed)
INR WAS OUT OF RANGE WHEN SHE TESTED YESTERDAY IT WAS 1.2. PLEASE ADVISE

## 2013-07-07 LAB — PROTIME-INR: INR: 2.2 — AB (ref 0.9–1.1)

## 2013-07-07 NOTE — Telephone Encounter (Signed)
Please have her continue Warfarin 1.5mg  daily and repeat INR on Monday or Tuesday.

## 2013-07-07 NOTE — Telephone Encounter (Signed)
Patient's INR today was 2.2, please advise

## 2013-07-07 NOTE — Telephone Encounter (Signed)
Spoke with patient informed her of the results and medication changes to 1.5 daily. Patient verbalized understanding and will recheck next week.

## 2013-07-10 IMAGING — US ABDOMEN ULTRASOUND
1 series · 13 of 25 positions shown · non-contrast
Comparison: none

REASON FOR EXAM: right sided pain
COMMENTS:

[Series 1: abdomen ultrasound · 0.28mm/px · 13 of 89 slices shown]
[im 1/89]
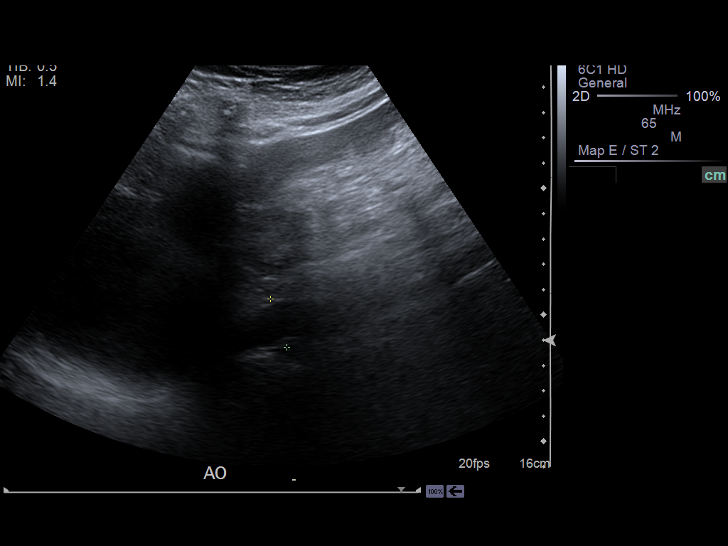
[im 8/89]
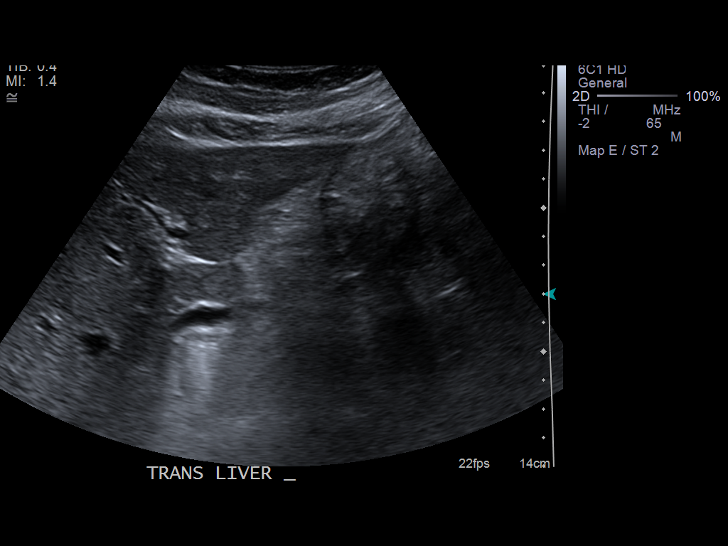
[im 15/89]
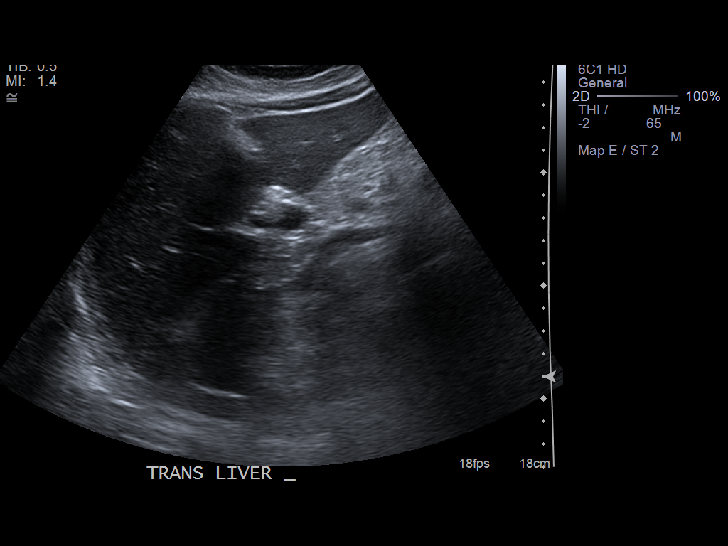
[im 23/89]
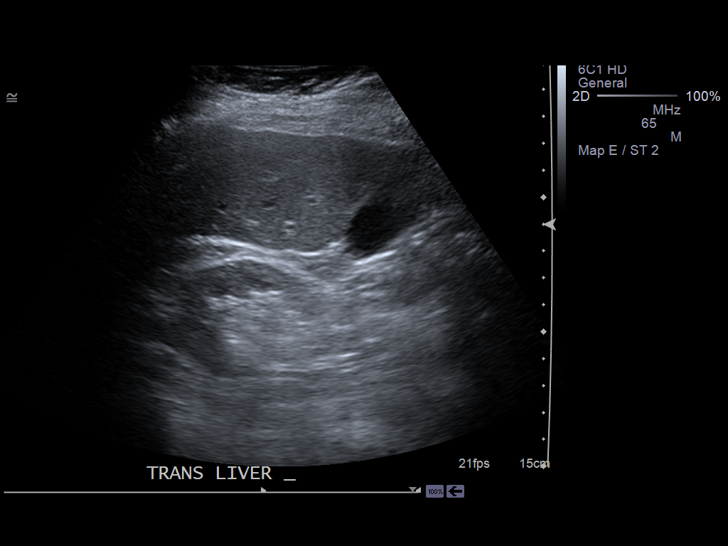
[im 30/89]
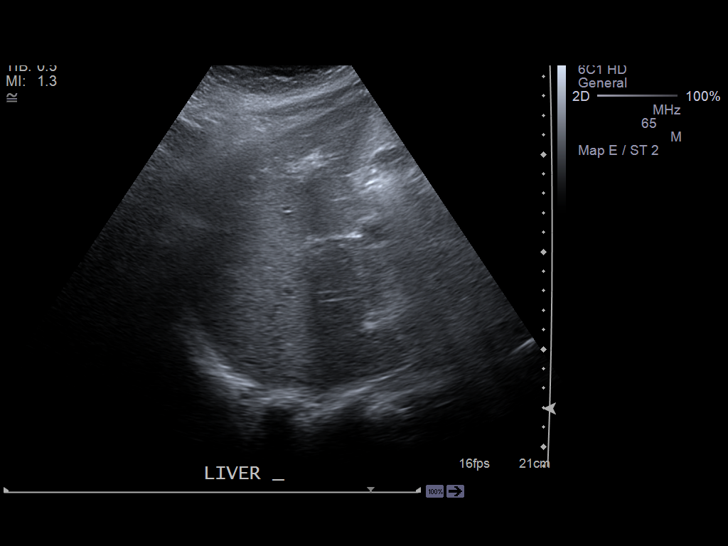
[im 37/89]
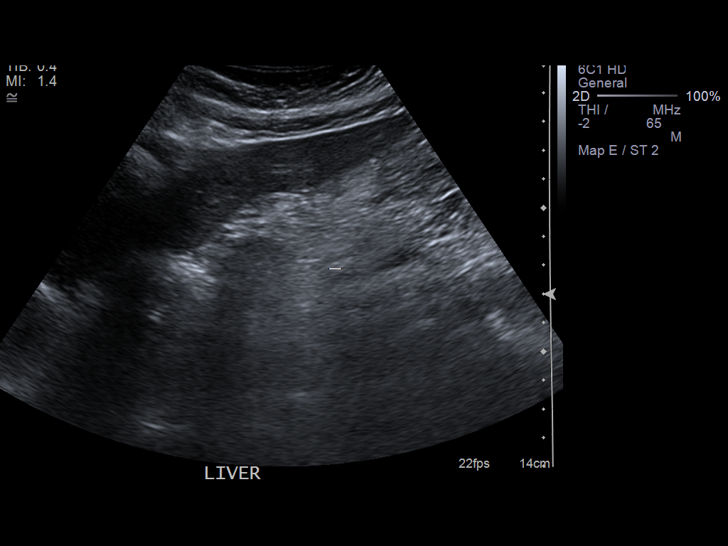
[im 45/89]
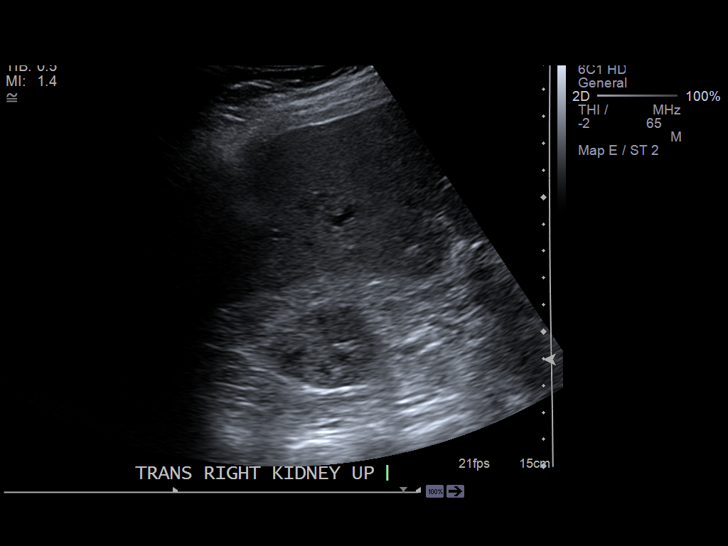
[im 52/89]
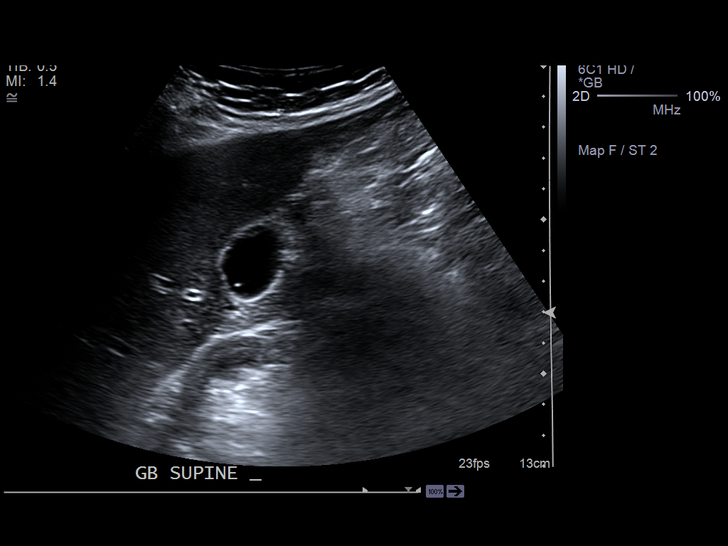
[im 59/89]
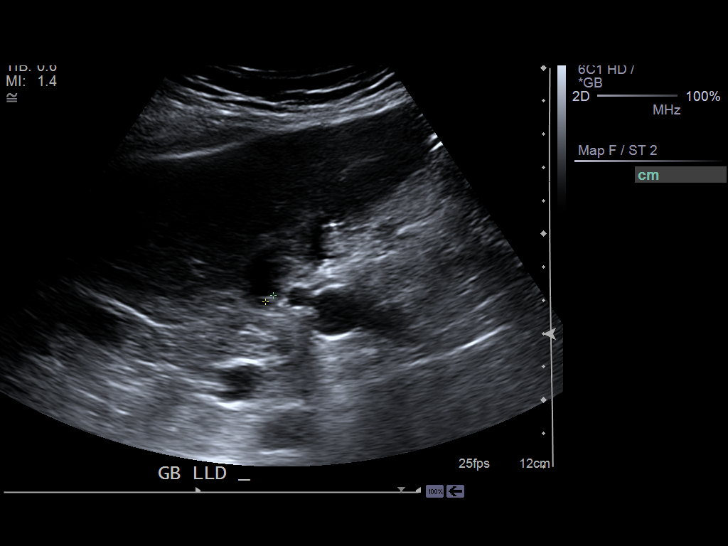
[im 67/89]
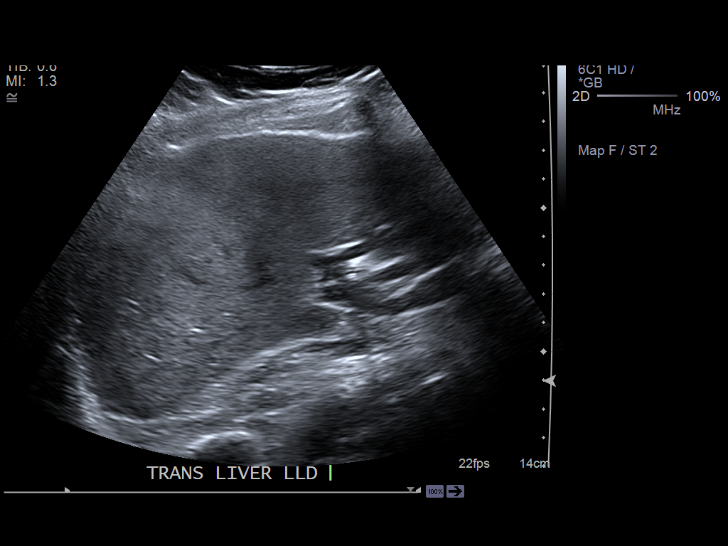
[im 74/89]
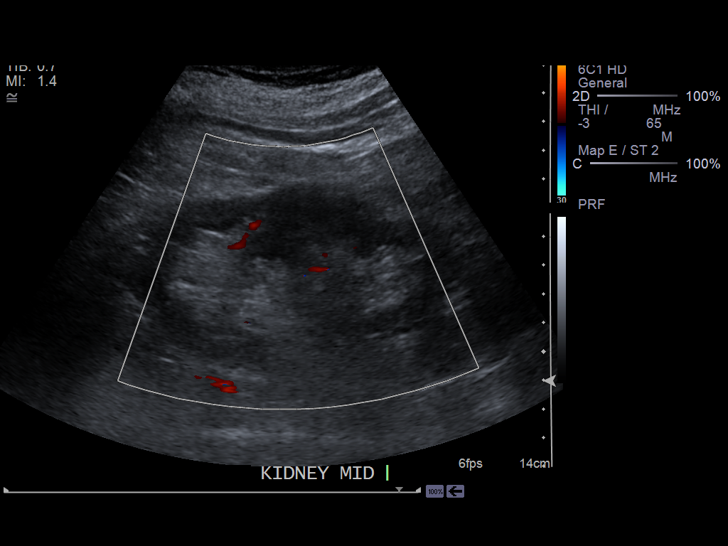
[im 81/89]
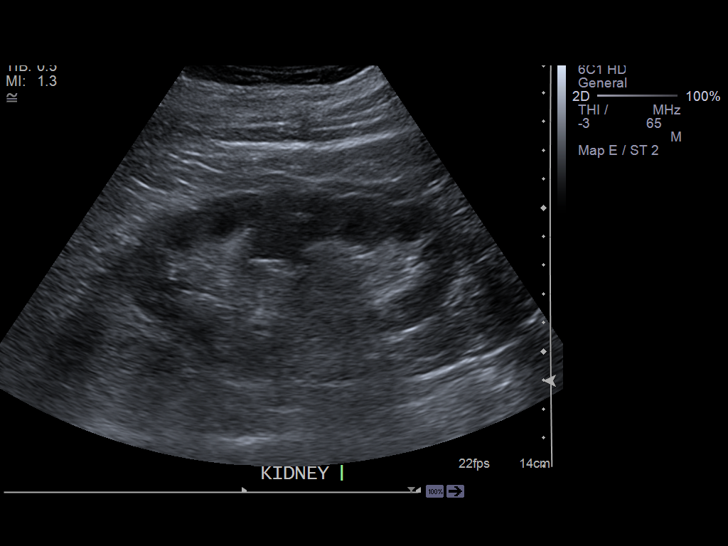
[im 89/89]
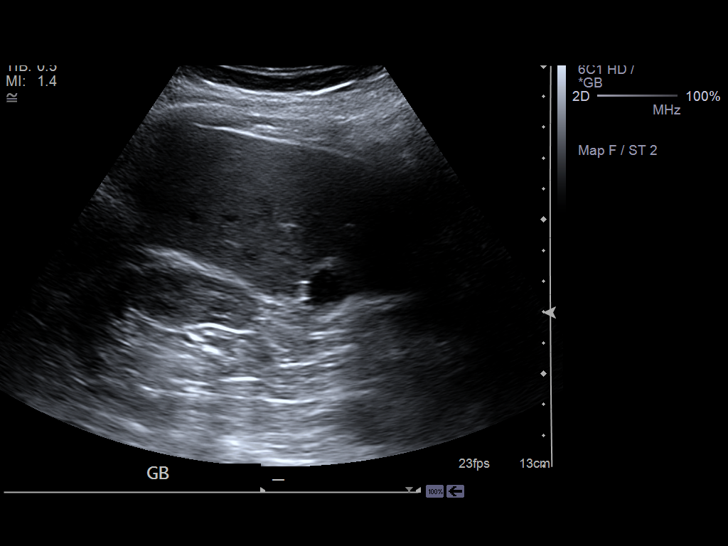

[13 of 25 positions shown; findings below may reference images not displayed]

PROCEDURE:     US  - US ABDOMEN GENERAL SURVEY  - December 28, 2011  [DATE]

RESULT:     Abdominal ultrasound is performed. The aorta proximally and
distally appears to be within normal limits. The visualized pancreas appears
to be within normal limits without mass or ductal dilation. The tail is not
seen. The hepatic echotexture appears to be normal. Portal venous flow is
normal. The proximal inferior vena cava is patent. The inferior vena cava is
patent. Right kidney measures 11.18 x 5.32 x 5.18 cm. There is a trace
amount of ascites adjacent to the spleen. The common bile duct is 5.3 mm
diameter, within normal limits. The gallbladder wall is 2.3 mm thick. There
are no definite stones present. There is a nonmobile echogenic nonshadowing
structure along the gallbladder wall suggestive of a followup. There is some
mobile echogenicity within the gallbladder which could represent sludge or
small stones. The left kidney measures 12.35 x 5.15 x 5.50 cm. Neither
kidney shows obstruction or mass. The splenic cyst seen previously is not
evident today.
IMPRESSION: 1. Debris versus small stones or sludge in the gallbladder. Probable
gallbladder polyp. No evidence of cholecystitis. Nonvisualization of the
pancreatic tail. A trace amount of ascites adjacent to the spleen. The
spleen is unremarkable.

[REDACTED]

## 2013-07-11 ENCOUNTER — Encounter: Payer: Self-pay | Admitting: Internal Medicine

## 2013-07-12 LAB — PROTIME-INR: INR: 4.7 — AB (ref 0.9–1.1)

## 2013-07-13 ENCOUNTER — Telehealth: Payer: Self-pay | Admitting: *Deleted

## 2013-07-13 NOTE — Telephone Encounter (Signed)
Hold coumadin today and recheck pt/inr tomorrow.  Make sure get results tomorrow - to determine her coumadin schedule after today.  See last message.

## 2013-07-13 NOTE — Telephone Encounter (Addendum)
Spoke with patient, she has not been on any antibiotics or had any abnormal bleeding. However patient was not taking 1.5 mg daily she was taking 2 mg daily. This was done on yesterday and she tried calling the On call person but did not receive a return call so she then called Dr. Alveria Apley office. The On-call provider called her back and told her not to take any Coumadin last night or tonight then wait on a return call from our office. Please advise, thank you.

## 2013-07-13 NOTE — Telephone Encounter (Signed)
[facility] called pt inr is 4.7

## 2013-07-13 NOTE — Telephone Encounter (Signed)
Spoke with patient informed her not to take any coumadin today then recheck it tomorrow. Patient stated she was writing this down to avoid making the same error as last week. While on the phone, patient mentioned she received a call about a bill she had given to Dr. Gilford Rile. They informed her she received the bill for labs because it wasn't coded incorrectly. She would like it to be resubmitted and coded differently.

## 2013-07-13 NOTE — Telephone Encounter (Signed)
Dr. Nicki Reaper, this is the same patient you helped me with last week. She has been taking 1.5 mg every night since last week. Please advise

## 2013-07-13 NOTE — Telephone Encounter (Signed)
Have her hold her coumadin today and recheck pt/inr tomorrow. Needs results tomorrow.   Do not take tomorrow until she hears from [place].  Confirm with pt no bleeding, abx, etc.  Show to Dr Gilford Rile.  Hold for results tomorrow to make sure receive.    Thanks.

## 2013-07-14 LAB — PROTIME-INR: INR: 2.2 — AB (ref 0.9–1.1)

## 2013-07-15 ENCOUNTER — Telehealth: Payer: Self-pay | Admitting: Internal Medicine

## 2013-07-15 IMAGING — CR DG CHEST 1V PORT
1 series · 1 of 1 positions shown · non-contrast
Comparison: none

REASON FOR EXAM: central line placement
COMMENTS:   LMP: Post-Menopausal

[portable]
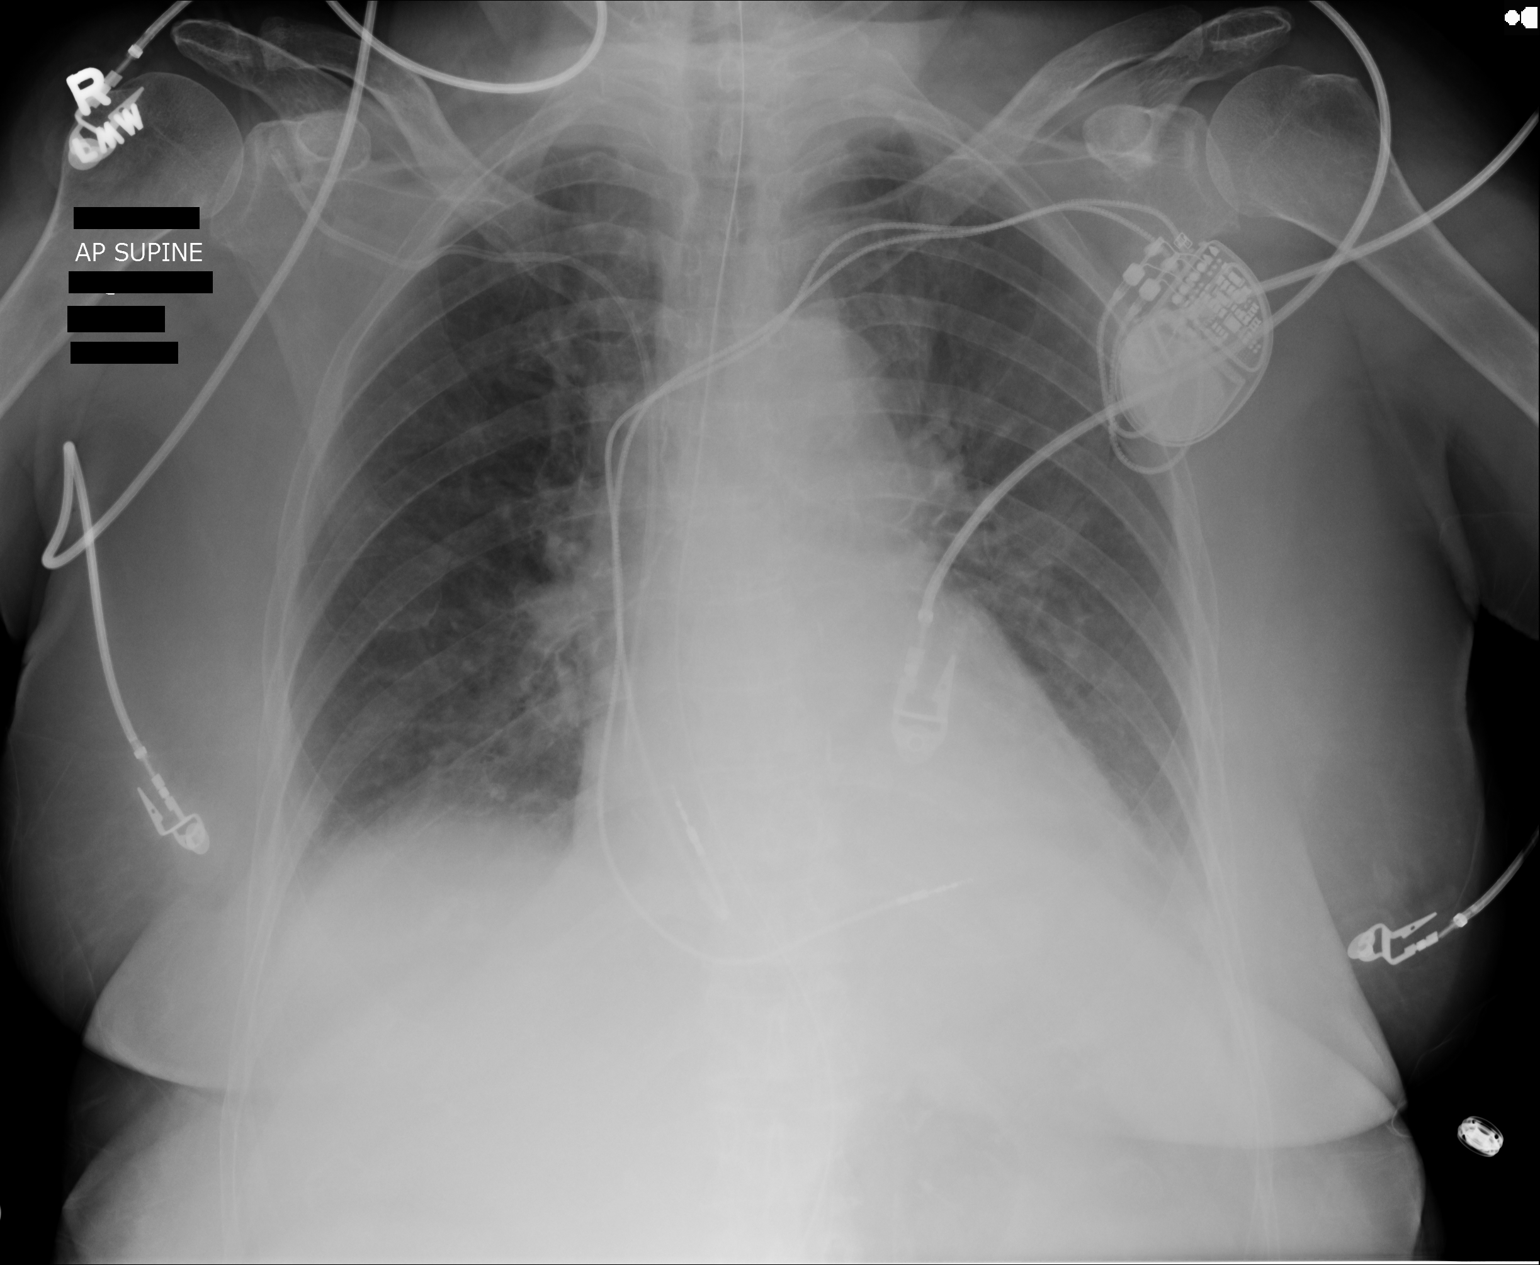

[1 of 1 positions shown; findings below may reference images not displayed]

PROCEDURE:     DXR - DXR PORTABLE CHEST SINGLE VIEW  - January 02, 2012  [DATE]

RESULT:     The patient has undergone interval placement of a right
subclavian venous catheter. The tip of the catheter lies in the region of
the junction of the SVC with the right atrium. There is an esophagogastric
tube present whose tip projects off the film. A permanent pacemaker is in
place.

The cardiac silhouette is normal in size. The retrocardiac region is dense.
Perihilar lung markings are in creased.
IMPRESSION: There is no post procedure complication following placement
of a right internal jugular venous catheter. The appearance of the heart and
pulmonary vascularity lungs suggest low-grade CHF.

[REDACTED]

## 2013-07-15 NOTE — Telephone Encounter (Signed)
Pt left message wanting carol to call her about her meds She wanted to know what she needed to take

## 2013-07-15 NOTE — Telephone Encounter (Signed)
Please advise what patient should do as of today.

## 2013-07-15 NOTE — Telephone Encounter (Signed)
Patient was not taking 1.5 mg she was taking 2 mg daily. Information documented in previous note. I will call patient back to have her come into our lab for INR check for accuracy.

## 2013-07-15 NOTE — Telephone Encounter (Signed)
Resume Coumadin 1.5mg  daily and repeat INR next week.

## 2013-07-15 NOTE — Telephone Encounter (Signed)
INR on 2/26 was 2.2. Can you confirm that she is taking 1.5mg  daily? We should also plan to check an INR here in our office next week to confirm accuracy of the [facility] system.

## 2013-07-15 NOTE — Telephone Encounter (Signed)
Patient informed and verbalized understanding to take 1.5 mg daily. Will recheck next Thursday.

## 2013-07-15 NOTE — Telephone Encounter (Signed)
Message on that voicemail was old, I have already spoken with patient in reference to this. She did state she left a voicemail but thought that was not a good idea so she called back.

## 2013-07-19 NOTE — Telephone Encounter (Signed)
Patient called and left a message on voicemail at 12:11 pm.

## 2013-07-19 NOTE — Telephone Encounter (Signed)
Returned patient call, she stated she gave Dr. Gilford Rile a bill she received for labs that would not be covered by her insurance. She just got another bill from [place], she called them and they have no documentation of anything from our office. But she thinks this happened when she went to [facility] and since he is a heart doctor then they rejected it. Would like to know if anything can be done about this and if you had done anything about the bill she gave you last time at her visit. This bill is over $200.

## 2013-07-21 LAB — PROTIME-INR

## 2013-07-24 IMAGING — US US EXTREM LOW VENOUS*R*
1 series · 14 of 22 positions shown · non-contrast
Comparison: none

REASON FOR EXAM: swelling, sob, r/o DVT
COMMENTS:

[Series 1: us extrem low venous*right* · 0.10mm/px · 14 of 22 slices shown]
[im 1/22]
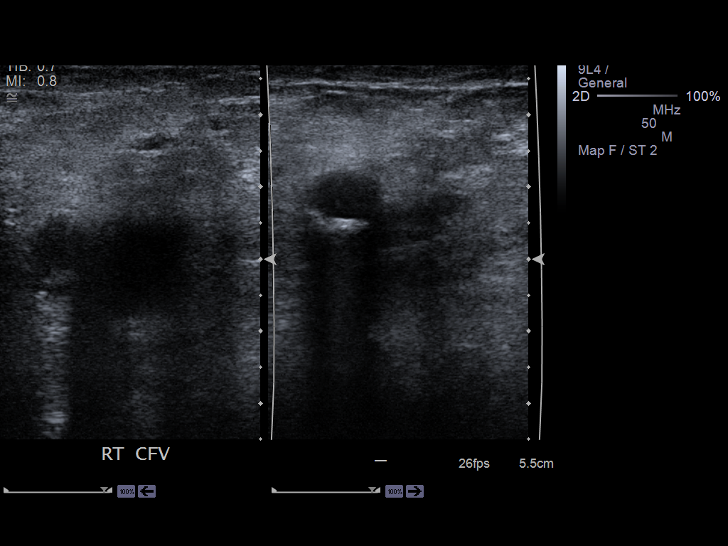
[im 3/22]
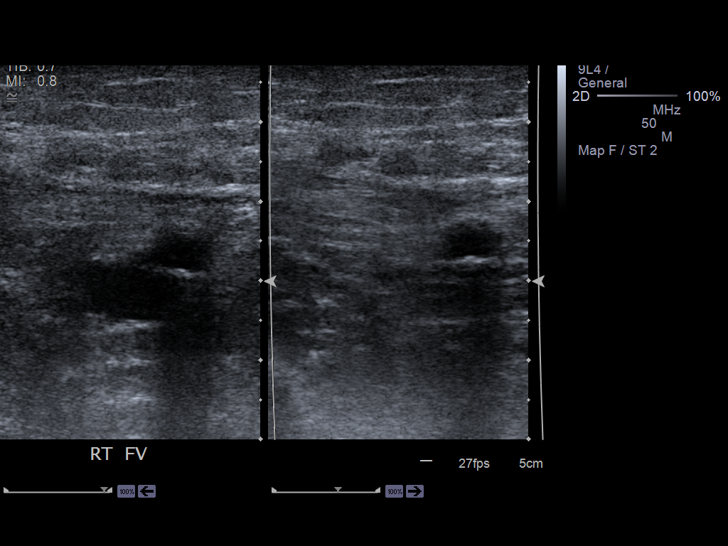
[im 4/22]
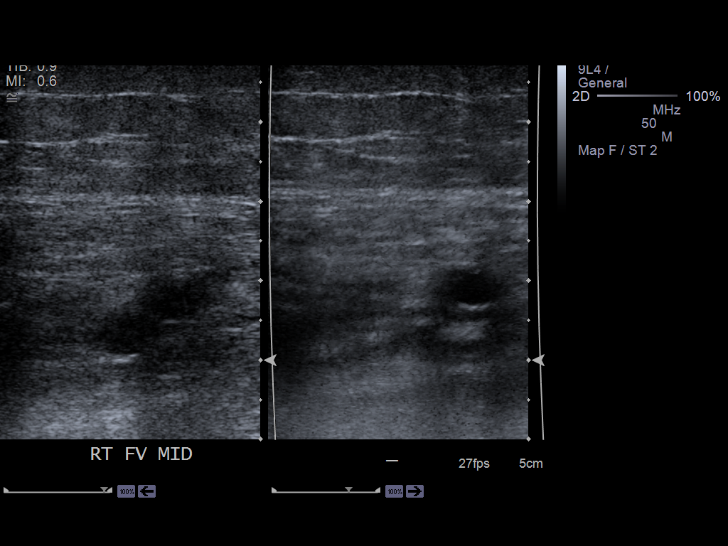
[im 6/22]
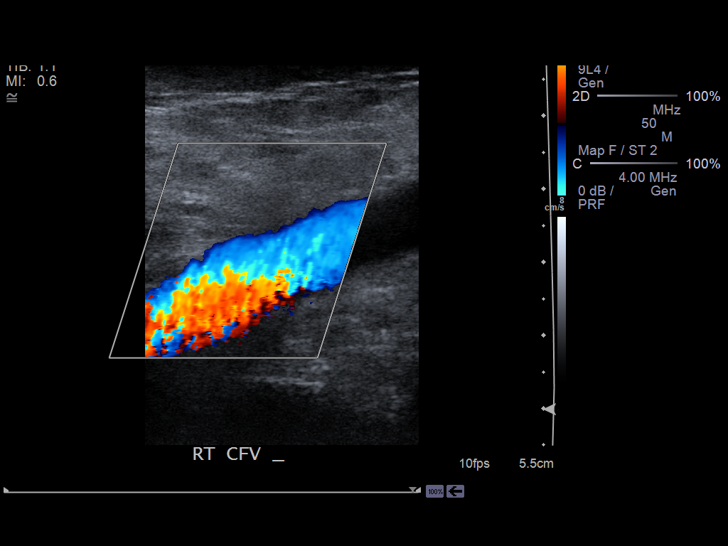
[im 8/22]
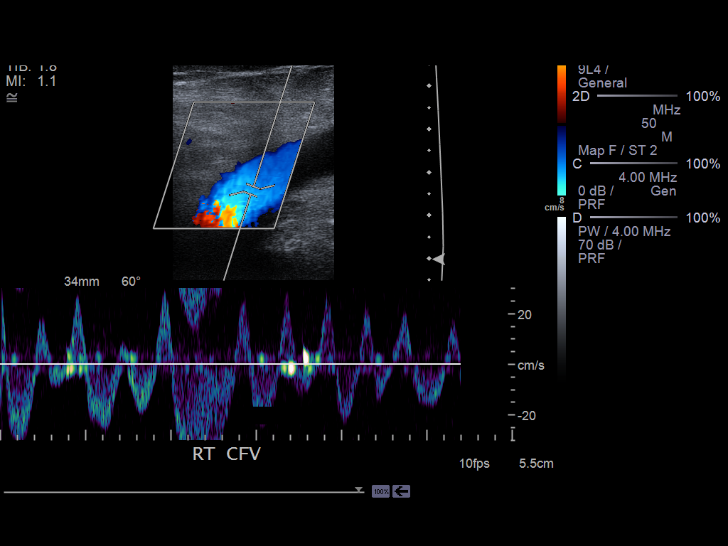
[im 9/22]
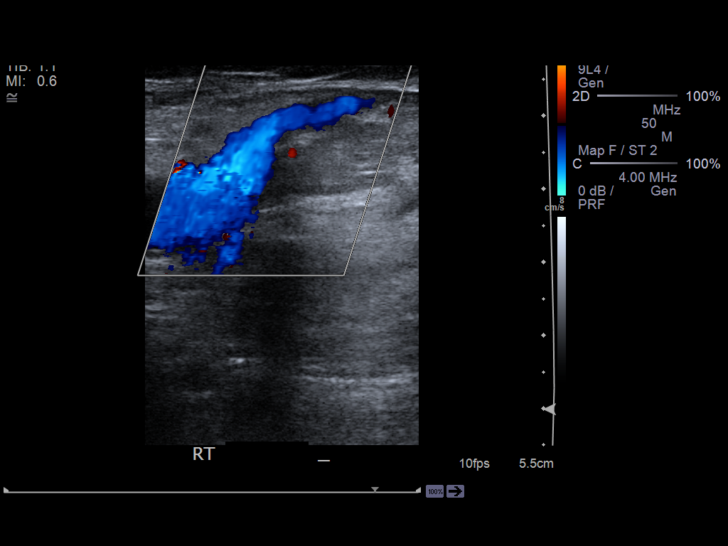
[im 11/22]
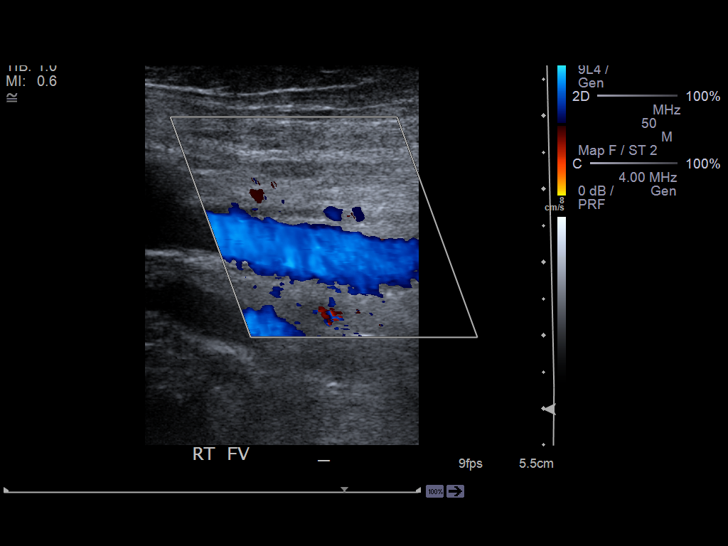
[im 12/22]
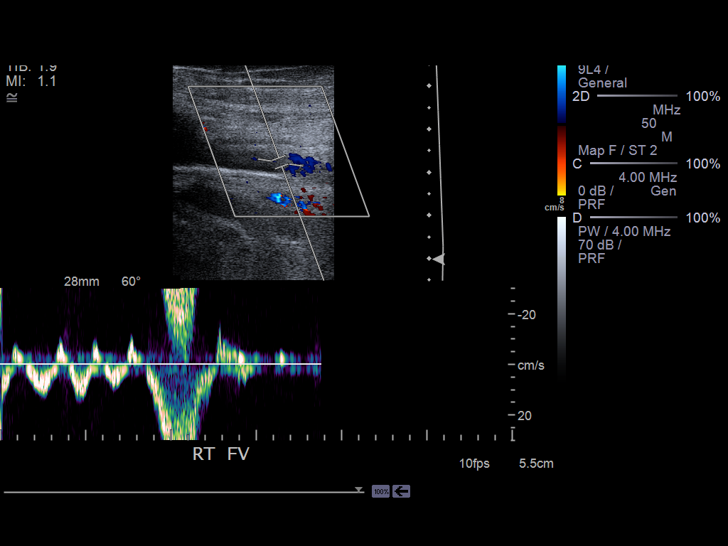
[im 14/22]
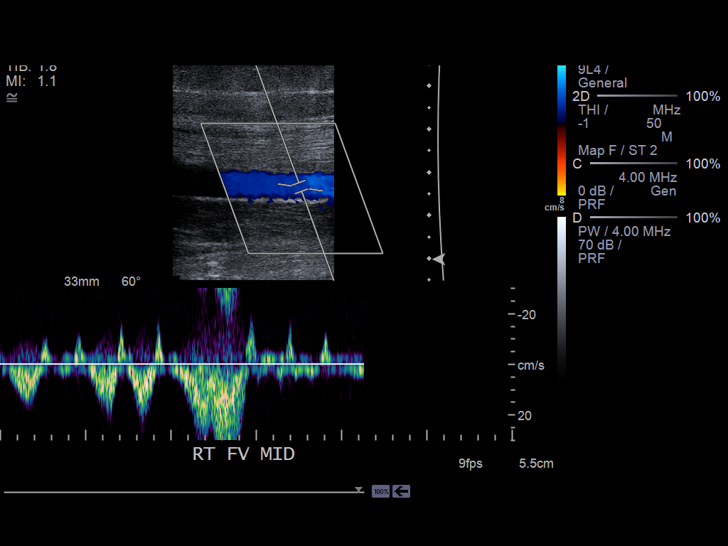
[im 15/22]
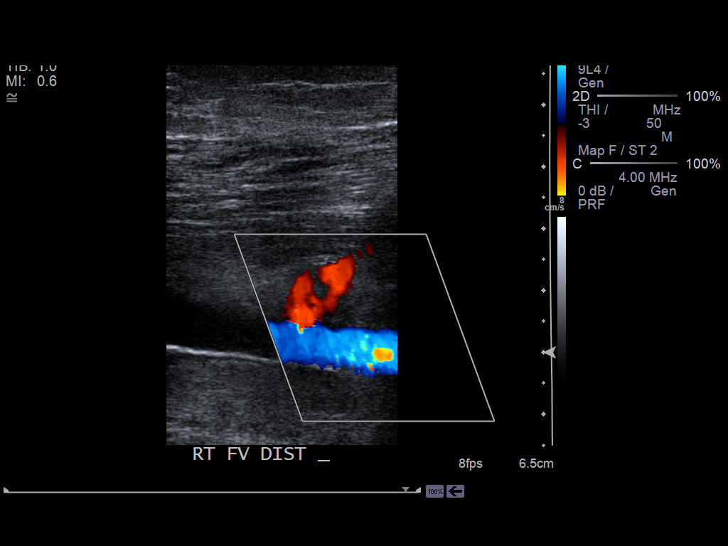
[im 17/22]
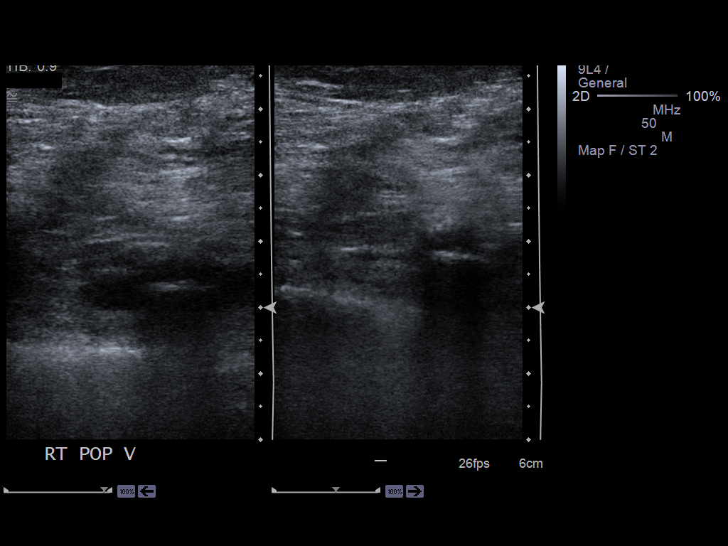
[im 19/22]
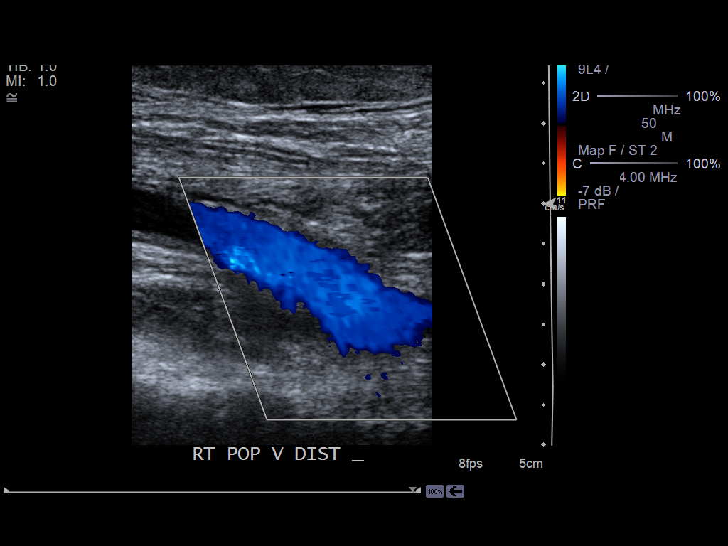
[im 20/22]
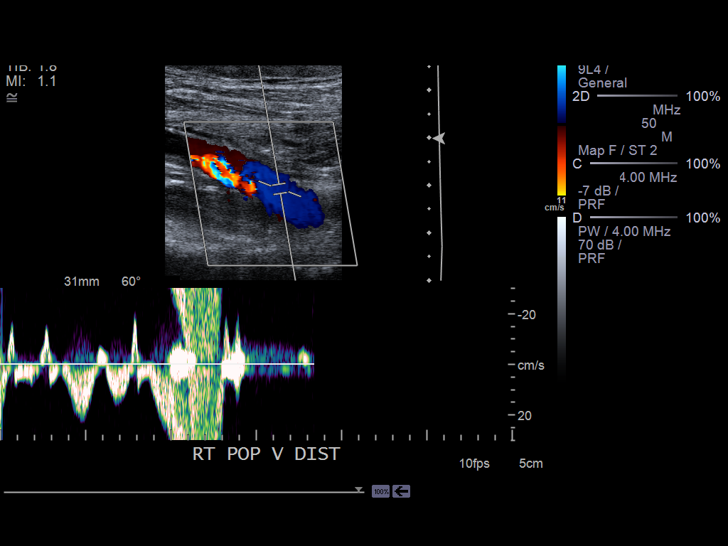
[im 22/22]
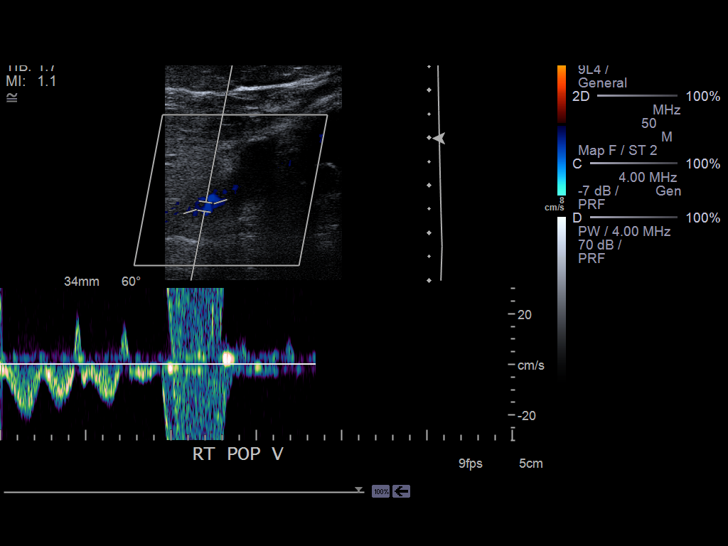

[14 of 22 positions shown; findings below may reference images not displayed]

PROCEDURE:     US  - US DOPPLER LOW EXTR RIGHT  - January 11, 2012  [DATE]

RESULT:     Technique: Gray scale, Duplex color flow and SPECTRAL waveform
imaging was performed of the deep venous structures of the right lower
extremity.

There is not evidence of increased echogenicity, non- compressibility,
abnormal waveform or abnormal grayscale flow with the interrogated deep
venous structures of the RIGHT lower extremity. There is appropriate
response to Valsalva and augmentation within the interrogated vessels.
IMPRESSION: 1. No sonographic evidence of a deep venous thrombus within the interrogated
vessels of the RIGHT lower extremity.

## 2013-07-25 IMAGING — CR DG CHEST 2V
1 series · 3 of 3 positions shown · non-contrast
Comparison: none

REASON FOR EXAM: SOB
COMMENTS:

PROCEDURE:     DXR - DXR CHEST PA (OR AP) AND LATERAL  - January 12, 2012 [DATE]
RESULT:     Comparison is made to a prior study dated 01/11/2012.

[Series 1: pa · 0.17mm/px · 3 of 3 slices shown]
[im 1/3]
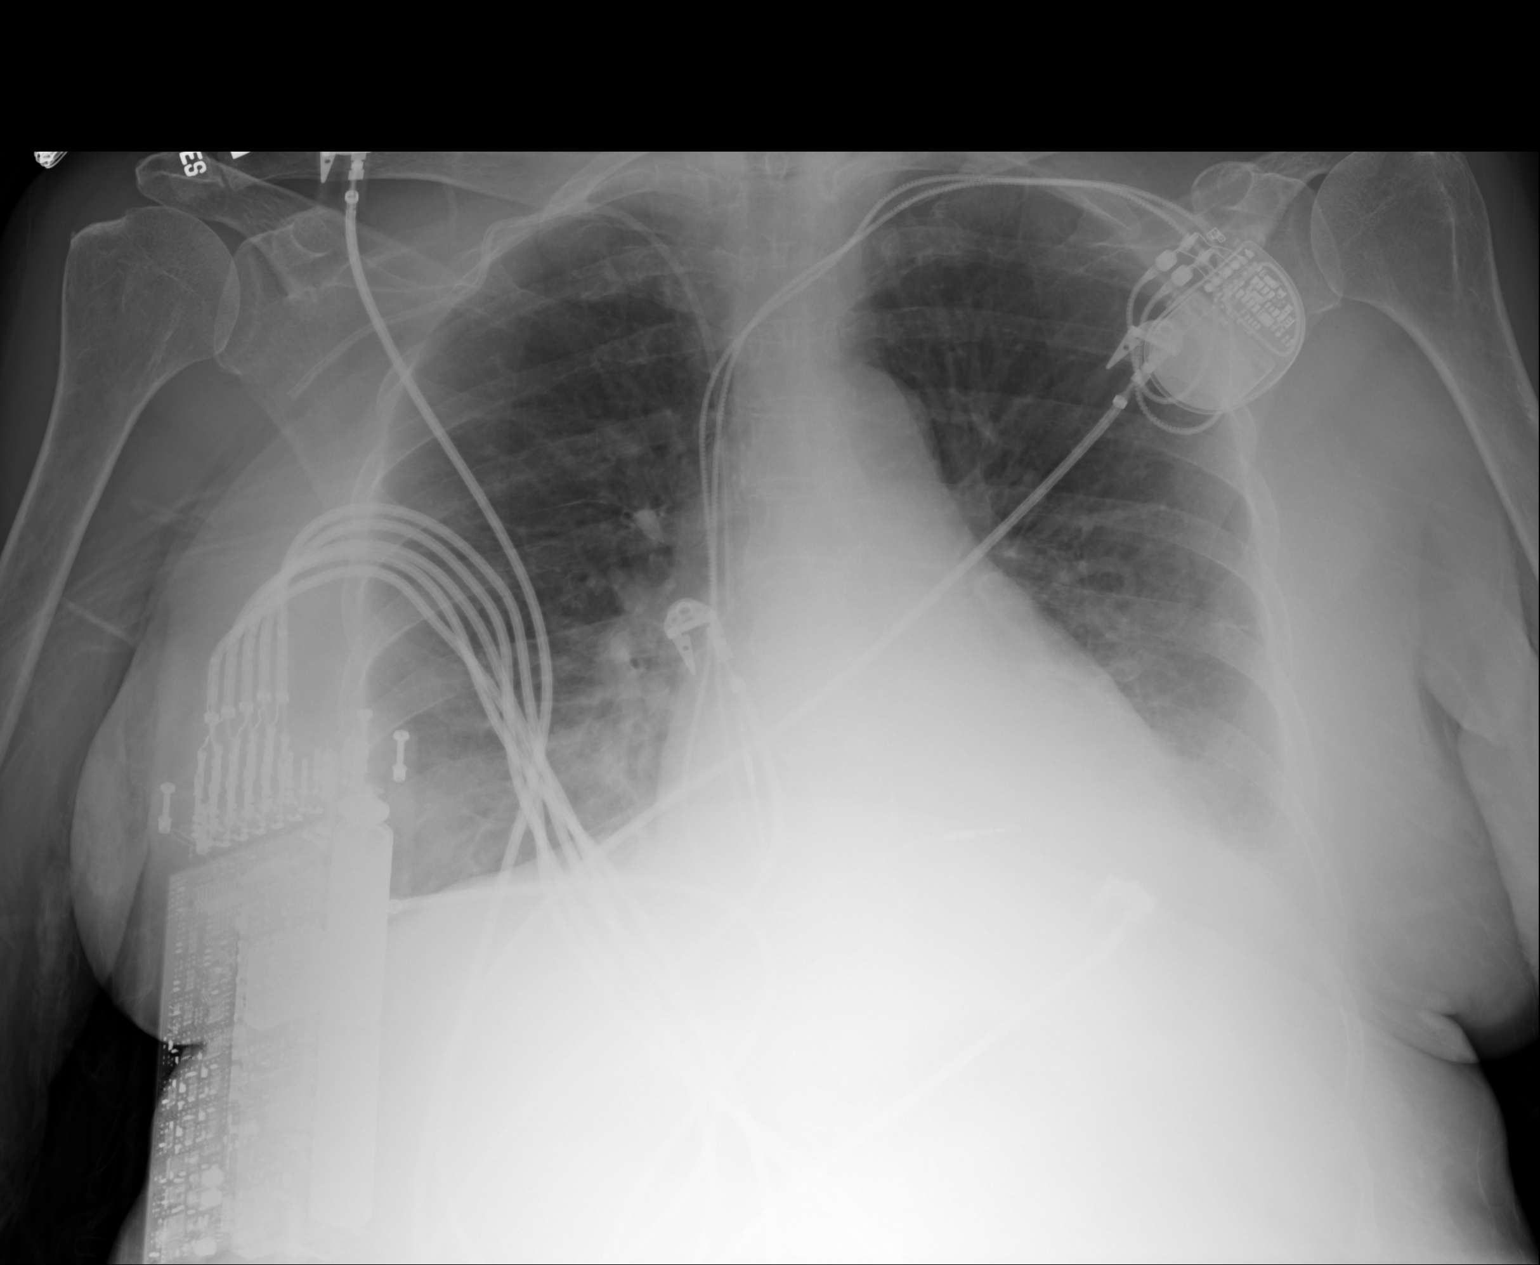
[im 2/3]
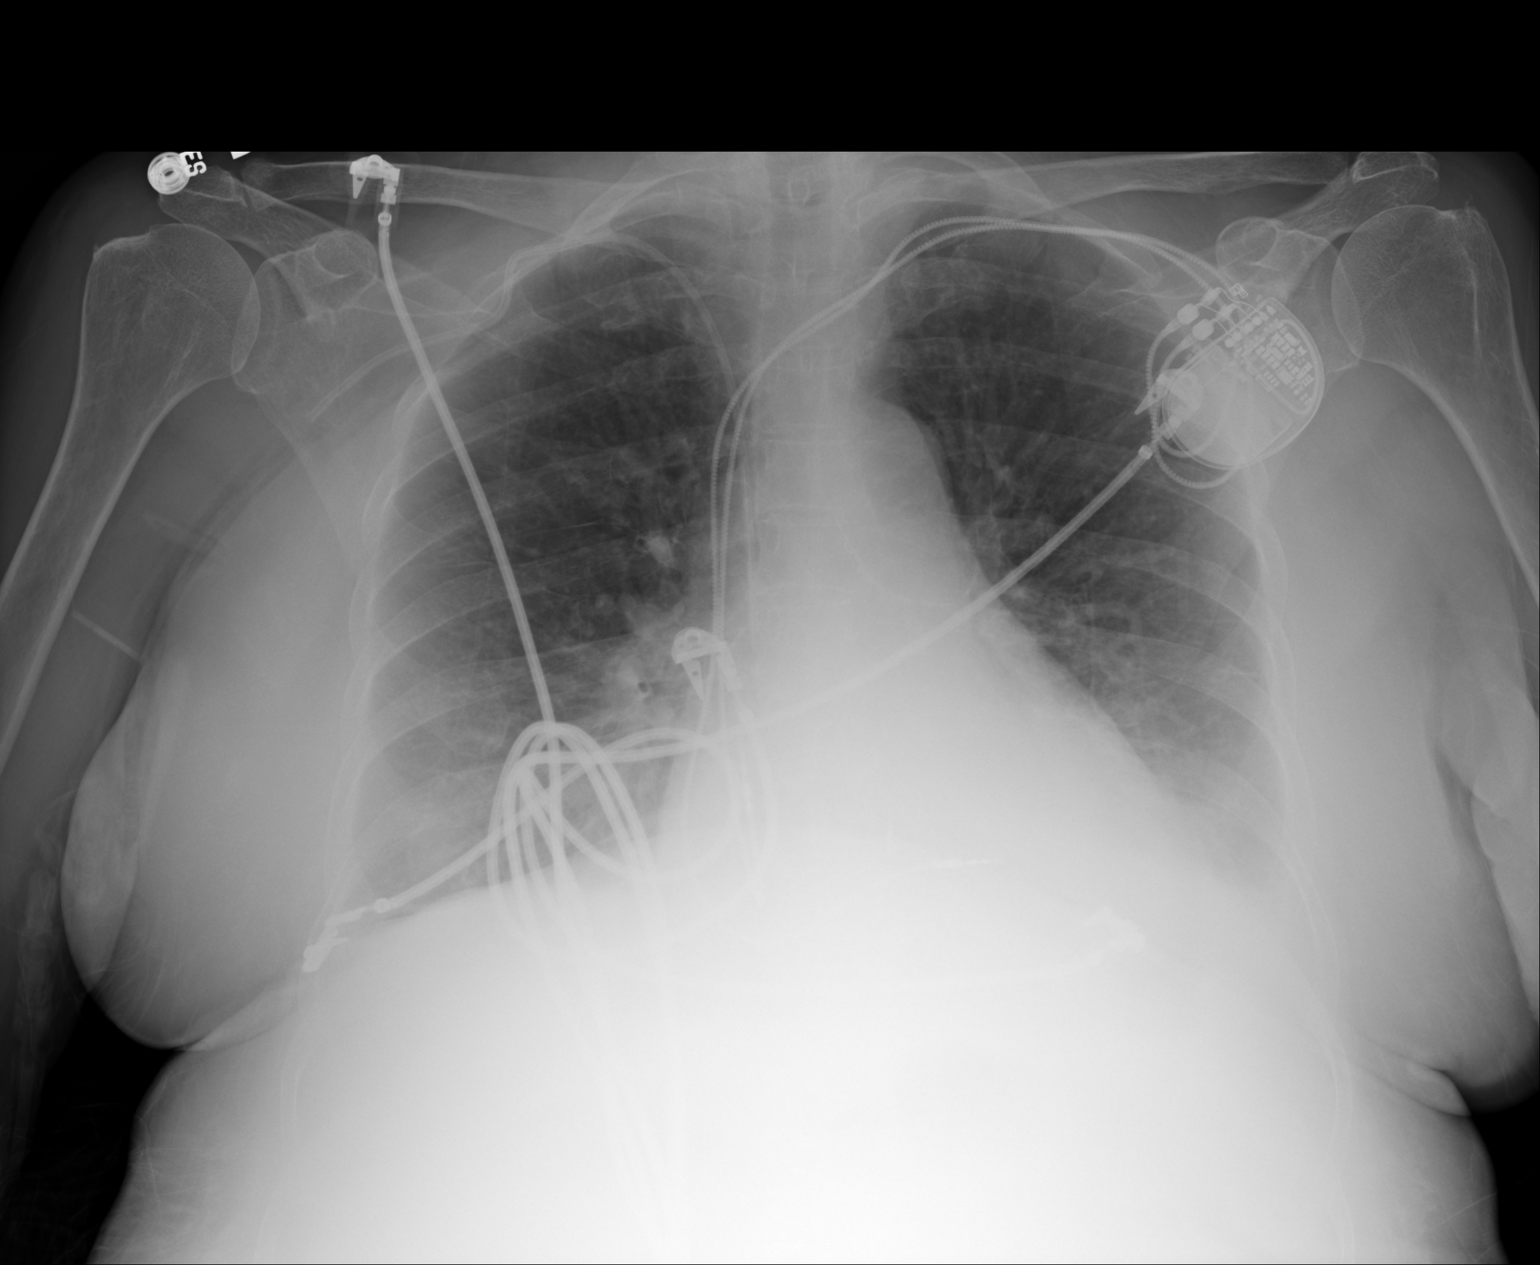
[im 3/3]
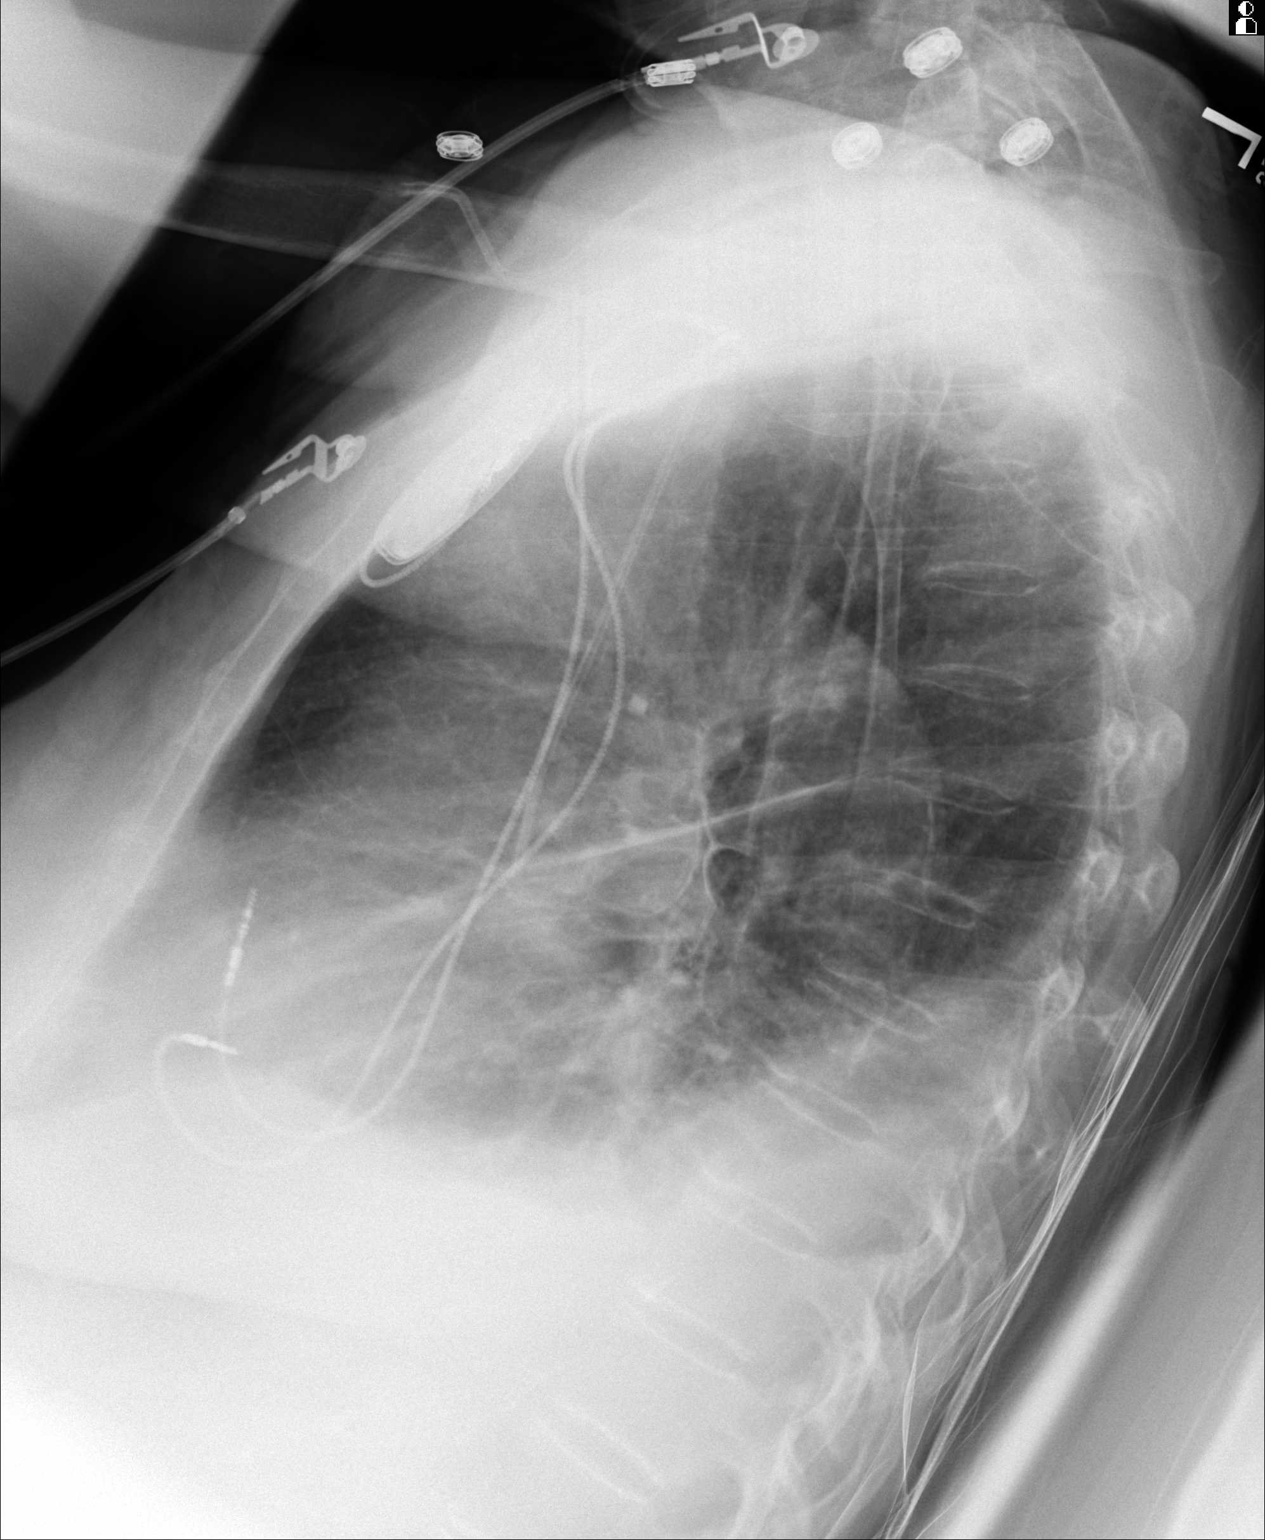

[3 of 3 positions shown; findings below may reference images not displayed]

FINDINGS: An area of increased density projects within the retrocardiac
region on the left. There is blunting of the left costophrenic angle. There
is prominence of the interstitial markings. Perihilar opacities are slightly
less prominent. The cardiac silhouette is moderately enlarged. The
visualized bony skeleton is unremarkable.
IMPRESSION: 1.  Atelectasis versus infiltrate within the left lung base as well as
possibly a left effusion.
2.  Pulmonary vasculature congestion/edema.
3.  Continued surveillance evaluation is recommended.

## 2013-07-27 ENCOUNTER — Encounter: Payer: Self-pay | Admitting: Internal Medicine

## 2013-07-27 IMAGING — CR DG CHEST 2V
1 series · 3 of 3 positions shown · non-contrast
Comparison: none

REASON FOR EXAM: pneumonia?
COMMENTS:

[Series 1: x chest ap · 0.14mm/px · 3 of 3 slices shown]
[im 1/3]
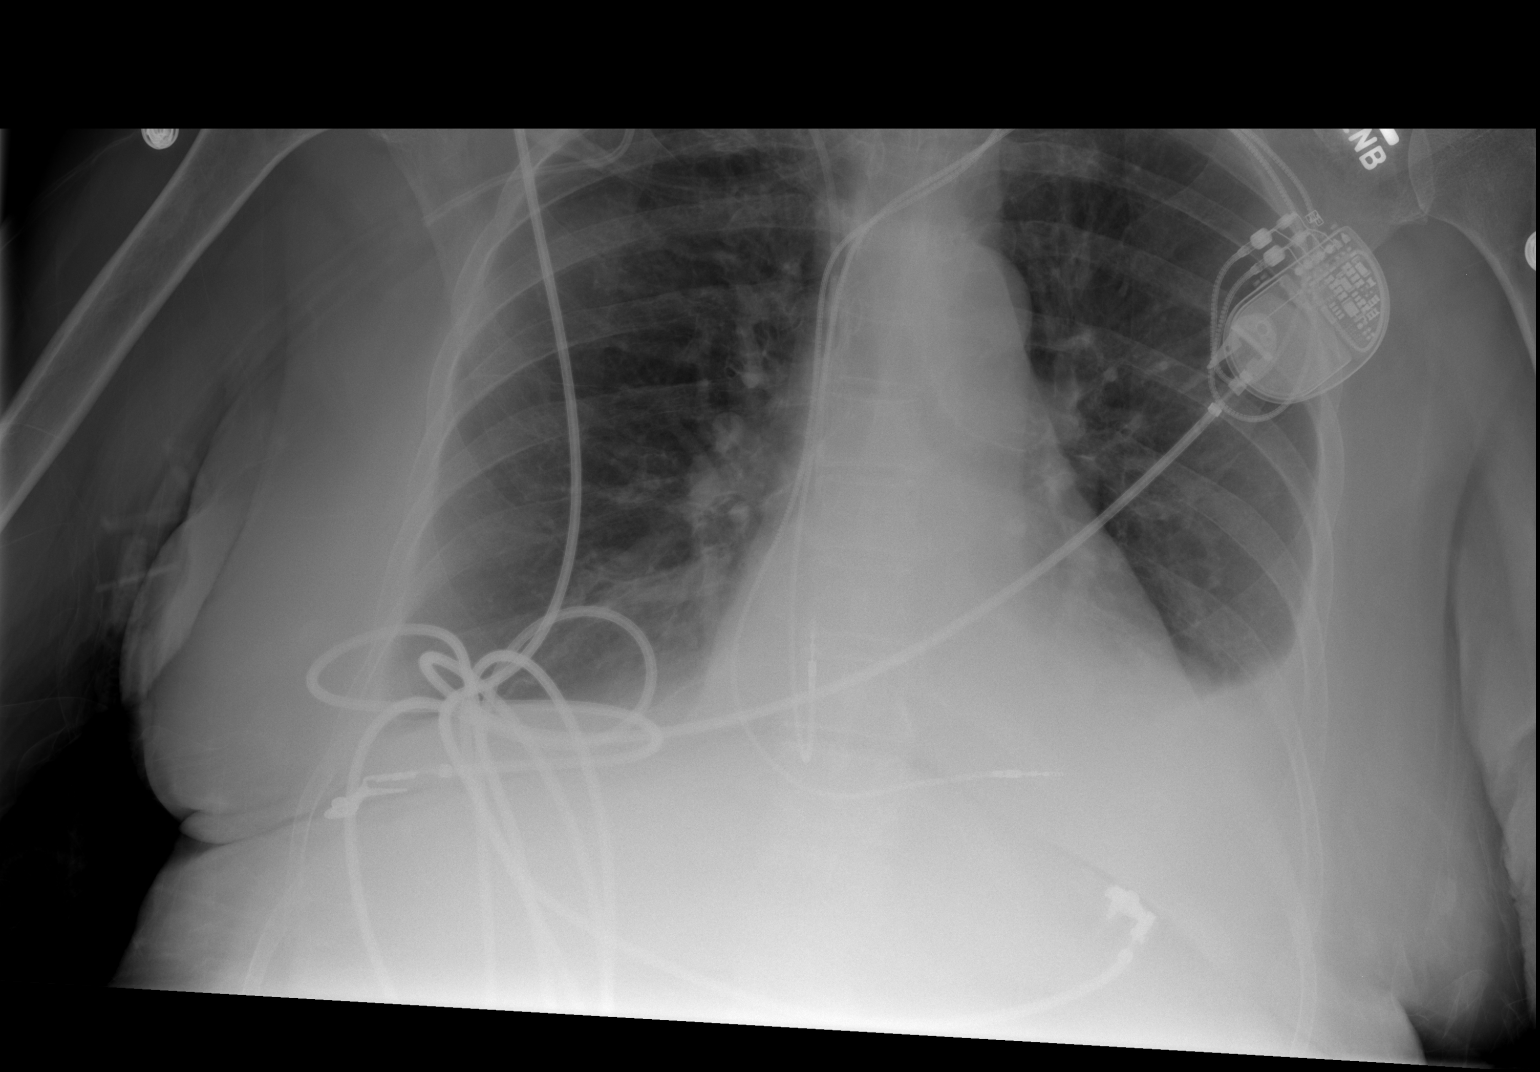
[im 2/3]
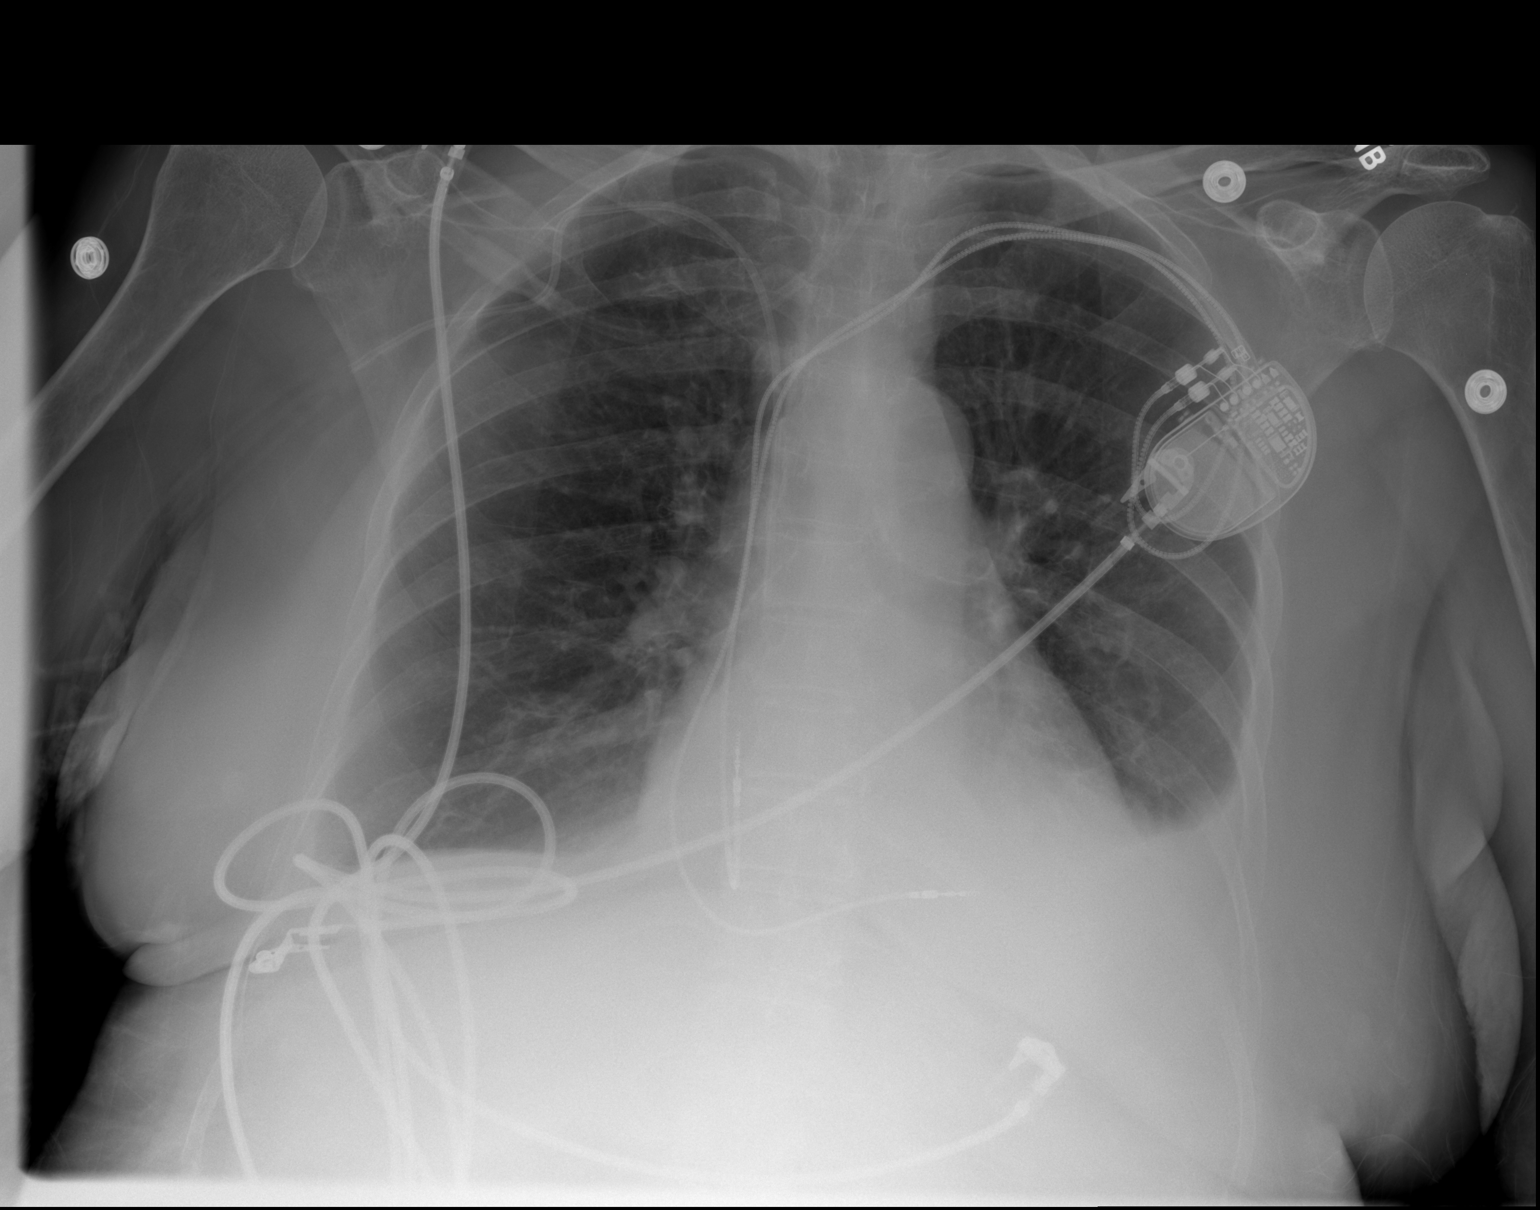
[im 3/3]
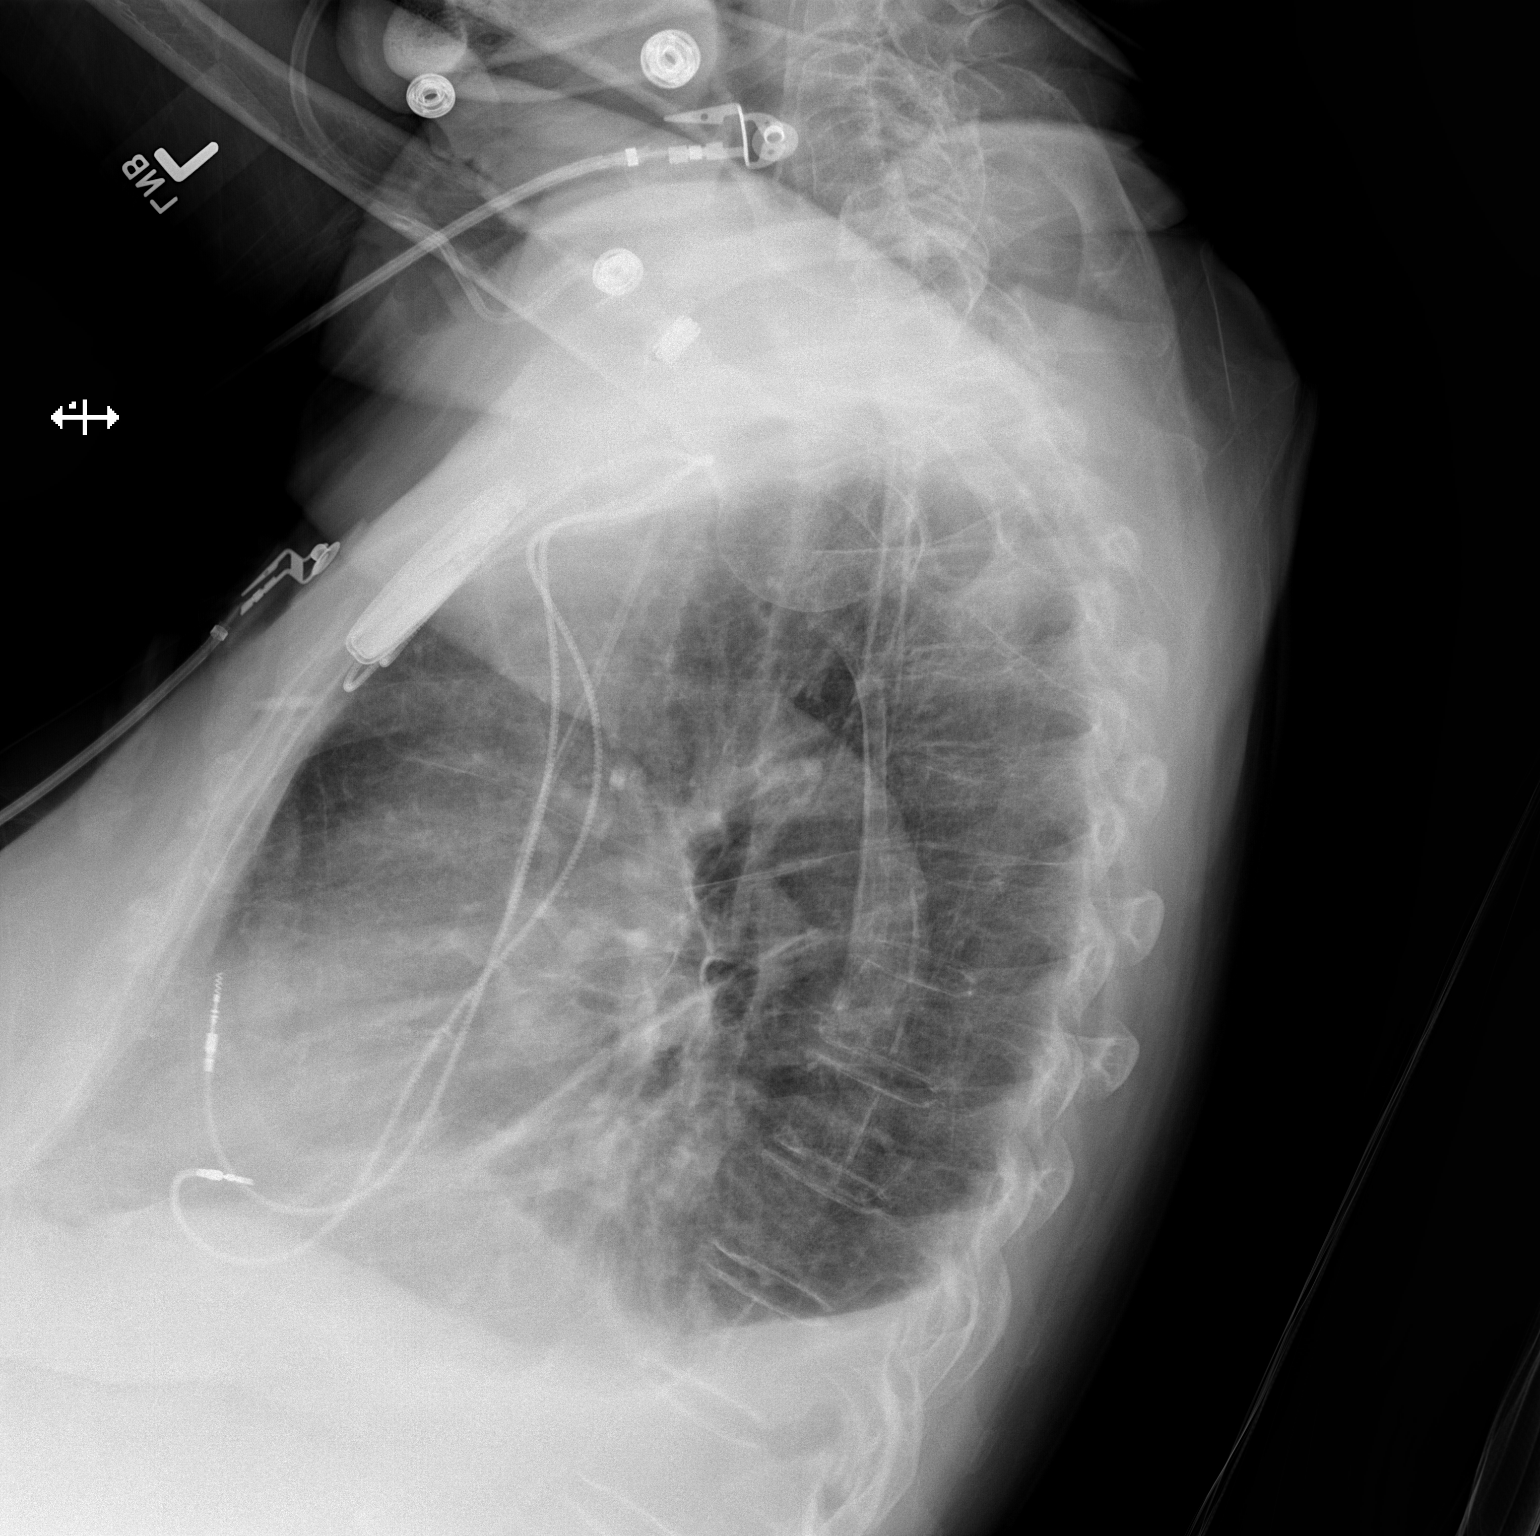

[3 of 3 positions shown; findings below may reference images not displayed]

PROCEDURE:     DXR - DXR CHEST PA (OR AP) AND LATERAL  - January 14, 2012 [DATE]

RESULT:     Comparison is made to the previous exam of 12 January, 2012.

A left-sided pacemaker device is present. The cardiac silhouette appears
normal. There is a right-sided subclavian central venous catheter present
with the tip in the superior vena cava. Cardiac monitoring electrodes are
present. Left pleural effusion is present. Trace right pleural effusion is
suggested. No definite pneumothorax or focal consolidation is seen otherwise.
IMPRESSION: 1. Stable left pleural effusion. Cannot exclude trace right pleural
effusion.
2. Right subclavian central venous catheter is present.

[REDACTED]

## 2013-07-28 ENCOUNTER — Other Ambulatory Visit: Payer: Self-pay | Admitting: Internal Medicine

## 2013-07-28 ENCOUNTER — Telehealth: Payer: Self-pay | Admitting: *Deleted

## 2013-07-28 DIAGNOSIS — I4891 Unspecified atrial fibrillation: Secondary | ICD-10-CM | POA: Diagnosis not present

## 2013-07-28 NOTE — Telephone Encounter (Signed)
Fax from [facility]R for patient self testing results today are listed below:  INR 2.3

## 2013-07-28 NOTE — Telephone Encounter (Signed)
[place] - I put the bill on your desk? Did you forward it to billing?

## 2013-07-28 NOTE — Telephone Encounter (Signed)
Continue current dose coumadin. Repeat weekly INR

## 2013-07-28 NOTE — Telephone Encounter (Signed)
Patient informed and verbalized understanding. She is still concerned about the bill she received from [place], stated she called [facility] and they told her they has not heard anything from this office. Patient would like a return call about this.

## 2013-08-01 ENCOUNTER — Telehealth: Payer: Self-pay | Admitting: Internal Medicine

## 2013-08-01 DIAGNOSIS — I4891 Unspecified atrial fibrillation: Secondary | ICD-10-CM | POA: Diagnosis not present

## 2013-08-01 DIAGNOSIS — I059 Rheumatic mitral valve disease, unspecified: Secondary | ICD-10-CM | POA: Diagnosis not present

## 2013-08-01 DIAGNOSIS — R0602 Shortness of breath: Secondary | ICD-10-CM | POA: Diagnosis not present

## 2013-08-01 DIAGNOSIS — I1310 Hypertensive heart and chronic kidney disease without heart failure, with stage 1 through stage 4 chronic kidney disease, or unspecified chronic kidney disease: Secondary | ICD-10-CM | POA: Diagnosis not present

## 2013-08-01 NOTE — Telephone Encounter (Signed)
Note already opened in reference to this and it has been forwarded to [facility]

## 2013-08-01 NOTE — Telephone Encounter (Signed)
Pt brought in invoice from billing stating it needs to be recoded and sent back through insurance Its labs from [facility] In box

## 2013-08-02 ENCOUNTER — Encounter: Payer: Self-pay | Admitting: Internal Medicine

## 2013-08-02 NOTE — Telephone Encounter (Signed)
I called the patient to inform her that I had contacted [place] and had them add codes to her bill. She was very grateful.

## 2013-08-02 NOTE — Telephone Encounter (Signed)
She states that she can add codes to the patients bill however she can't delete a code unless we fax over that request. She has added the following codes as primary codes for the patient 427.31 atrial fibrillation, ,787.91 chronic diarrhea,780.79 fatigue, and 276.8 hypokalemia. She states the patient should disregard the bill and that it may take Medicare 30-45 days to process the bill.

## 2013-08-10 ENCOUNTER — Encounter: Payer: Self-pay | Admitting: Internal Medicine

## 2013-08-11 ENCOUNTER — Other Ambulatory Visit (INDEPENDENT_AMBULATORY_CARE_PROVIDER_SITE_OTHER): Payer: Medicare Other

## 2013-08-11 ENCOUNTER — Telehealth: Payer: Self-pay | Admitting: Internal Medicine

## 2013-08-11 ENCOUNTER — Telehealth: Payer: Self-pay | Admitting: *Deleted

## 2013-08-11 DIAGNOSIS — I4891 Unspecified atrial fibrillation: Secondary | ICD-10-CM | POA: Diagnosis not present

## 2013-08-11 DIAGNOSIS — Z7901 Long term (current) use of anticoagulants: Secondary | ICD-10-CM | POA: Diagnosis not present

## 2013-08-11 LAB — PROTIME-INR
INR: 4.9 ratio — ABNORMAL HIGH (ref 0.8–1.0)
Prothrombin Time: 50.5 s — ABNORMAL HIGH (ref 10.2–12.4)

## 2013-08-11 NOTE — Telephone Encounter (Signed)
Called and spoke with patient, she will stop coumadin and came back for her labs.

## 2013-08-11 NOTE — Telephone Encounter (Signed)
Called with critical INR of 5.4 PLEASE HAVE PT STOP COUMADIN. COME TO OFFICE NOW FOR STAT REPEAT INR

## 2013-08-11 NOTE — Telephone Encounter (Signed)
The results are from 2/17, should she still recheck in 1 month?

## 2013-08-11 NOTE — Telephone Encounter (Signed)
Message copied by Ronaldo Miyamoto on Thu Aug 11, 2013 10:34 AM ------      Message from: Ronette Deter A      Created: Wed Aug 10, 2013 10:11 AM       Coumadin level was perfect. Please continue Coumadin same dose and repeat INR in 1 month. ------

## 2013-08-11 NOTE — Telephone Encounter (Signed)
The patient called back wanting to speak to [place].

## 2013-08-11 NOTE — Telephone Encounter (Signed)
Patient came in today for repeat labs.

## 2013-08-11 NOTE — Telephone Encounter (Signed)
No. Sorry this is incorrect. She checks weekly.

## 2013-08-12 ENCOUNTER — Telehealth: Payer: Self-pay | Admitting: Internal Medicine

## 2013-08-12 ENCOUNTER — Encounter: Payer: Self-pay | Admitting: Internal Medicine

## 2013-08-12 LAB — PROTIME-INR: INR: 3.7 — AB (ref 0.9–1.1)

## 2013-08-12 NOTE — Telephone Encounter (Signed)
INR was 3.7 today. Please have her hold coumadin today and tomorrow. Start back 1mg  daily on Sunday. Repeat INR Monday.

## 2013-08-12 NOTE — Telephone Encounter (Signed)
Patient informed and verbalized understanding

## 2013-08-12 NOTE — Telephone Encounter (Signed)
Spoke with patient yesterday in reference to her lab results for her INR, please refer to lab results for more information.

## 2013-08-15 ENCOUNTER — Telehealth: Payer: Self-pay | Admitting: *Deleted

## 2013-08-15 LAB — PROTIME-INR

## 2013-08-15 NOTE — Telephone Encounter (Signed)
Fwd to Dr. Walker 

## 2013-08-15 NOTE — Telephone Encounter (Signed)
Debbie from [facility] called pt INR is 1.6

## 2013-08-15 NOTE — Telephone Encounter (Signed)
OK. Is she taking 1mg  Coumadin daily?

## 2013-08-16 NOTE — Telephone Encounter (Signed)
Checked messages on phone, patient left a message at 08:14 stating she was disappointed she did not receive a phone call yesterday about her coumadin. She took her INR early that morning and should have receive a call on yesterday. Called left message for patient to return cal.

## 2013-08-16 NOTE — Telephone Encounter (Signed)
OK. Let's have her continue Coumadin 1 mg daily and repeat INR next week.

## 2013-08-16 NOTE — Telephone Encounter (Signed)
Called and spoke with patient, she stated she did exactly as she was told. She took 1 mg on Monday and then checked it. She also received a letter in the mail about the [facility] IN[facility] machine, she need to come to her doctor office to have it checked. Transferred patient to the front to schedule appointment.

## 2013-08-16 NOTE — Telephone Encounter (Signed)
Called informed patient to continue 1 mg tablet daily, she confirmed and verbalized understanding.

## 2013-08-22 DIAGNOSIS — I4891 Unspecified atrial fibrillation: Secondary | ICD-10-CM | POA: Diagnosis not present

## 2013-08-22 LAB — PROTIME-INR: INR: 1.7 — AB (ref 0.9–1.1)

## 2013-08-23 ENCOUNTER — Telehealth: Payer: Self-pay | Admitting: Internal Medicine

## 2013-08-23 NOTE — Telephone Encounter (Signed)
She is taking 1 mg daily.

## 2013-08-23 NOTE — Telephone Encounter (Signed)
OK. Continue with [place] and recheck INR on Thursday this week. Continue Coumadin 1mg  daily dose.

## 2013-08-23 NOTE — Telephone Encounter (Signed)
What dose is she taking right now? Can you set up evaluation with Benjie Karvonen at [facility]?

## 2013-08-23 NOTE — Telephone Encounter (Signed)
Patient informed to continue with current dose and verbalized understanding.

## 2013-08-23 NOTE — Telephone Encounter (Signed)
INR on 4/6 was 1.7. Given extensive variability in coumadin levels, I would recommend that we set her up at [facility] for the coumadin clinic.
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
## 2013-08-24 ENCOUNTER — Encounter: Payer: Self-pay | Admitting: *Deleted

## 2013-08-25 LAB — PROTIME-INR

## 2013-08-26 ENCOUNTER — Telehealth: Payer: Self-pay | Admitting: Internal Medicine

## 2013-08-26 NOTE — Telephone Encounter (Signed)
INR 1.8 on 08/25/2013. Continue Coumadin same dose and follow up with [facility] Coumadin clinic as scheduled.

## 2013-08-26 NOTE — Telephone Encounter (Signed)
OK. Then repeat INR next week with [facility]

## 2013-08-26 NOTE — Telephone Encounter (Signed)
Called and informed patient of results and continue on same dose of Coumadin. Patient stated she will not be able to go to [place] until the next week she is still moving.

## 2013-08-29 NOTE — Telephone Encounter (Signed)
Patient aware and she has an appointment scheduled for this week.

## 2013-08-31 ENCOUNTER — Encounter: Payer: Self-pay | Admitting: Internal Medicine

## 2013-09-01 ENCOUNTER — Ambulatory Visit (INDEPENDENT_AMBULATORY_CARE_PROVIDER_SITE_OTHER): Payer: Medicare Other | Admitting: Internal Medicine

## 2013-09-01 ENCOUNTER — Telehealth: Payer: Self-pay | Admitting: *Deleted

## 2013-09-01 ENCOUNTER — Encounter: Payer: Self-pay | Admitting: Internal Medicine

## 2013-09-01 ENCOUNTER — Telehealth: Payer: Self-pay | Admitting: Internal Medicine

## 2013-09-01 VITALS — BP 110/62 | HR 63 | Temp 97.9°F | Wt 125.2 lb

## 2013-09-01 DIAGNOSIS — I4891 Unspecified atrial fibrillation: Secondary | ICD-10-CM

## 2013-09-01 DIAGNOSIS — Z7901 Long term (current) use of anticoagulants: Secondary | ICD-10-CM | POA: Diagnosis not present

## 2013-09-01 DIAGNOSIS — R5381 Other malaise: Secondary | ICD-10-CM

## 2013-09-01 DIAGNOSIS — R5383 Other fatigue: Secondary | ICD-10-CM

## 2013-09-01 DIAGNOSIS — Z23 Encounter for immunization: Secondary | ICD-10-CM | POA: Diagnosis not present

## 2013-09-01 MED ORDER — HYDROCORTISONE ACE-PRAMOXINE 2.5-1 % RE CREA: TOPICAL_CREAM | Freq: Three times a day (TID) | RECTAL | Status: DC

## 2013-09-01 NOTE — Telephone Encounter (Signed)
Yes, fine for cream

## 2013-09-01 NOTE — Assessment & Plan Note (Signed)
Symptoms improving, as she is settled back into her home. Will continue to monitor.

## 2013-09-01 NOTE — Assessment & Plan Note (Addendum)
Rate controlled with diltiazem and metoprolol. Anticoagulated with Coumadin. Using [facility] which has been quite variable. Will check serum INR in office today.

## 2013-09-01 NOTE — Assessment & Plan Note (Signed)
Will check INR with labs today. Continue Warfarin.

## 2013-09-01 NOTE — Telephone Encounter (Signed)
INR: 1.4 (checked & resulted today)

## 2013-09-01 NOTE — Addendum Note (Signed)
Addended by: Wynonia Lawman E on: 09/01/2013 04:07 PM   Modules accepted: Orders

## 2013-09-01 NOTE — Telephone Encounter (Signed)
Ok for cream instead of suppository?

## 2013-09-01 NOTE — Progress Notes (Signed)
Pre visit review using our clinic review tool, if applicable. No additional management support is needed unless otherwise documented below in the visit note. 
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
## 2013-09-01 NOTE — Telephone Encounter (Signed)
Rx for suppository received.  They do not have suppository in strength requested but do have a cream in that strength.  Please contact pharmacist with further instructions.

## 2013-09-01 NOTE — Telephone Encounter (Signed)
Pharmacy notified.

## 2013-09-02 ENCOUNTER — Other Ambulatory Visit: Payer: Self-pay | Admitting: Internal Medicine

## 2013-09-02 LAB — COMPREHENSIVE METABOLIC PANEL
ALT: 19 U/L (ref 0–35)
AST: 27 U/L (ref 0–37)
Albumin: 3.5 g/dL (ref 3.5–5.2)
Alkaline Phosphatase: 51 U/L (ref 39–117)
BUN: 22 mg/dL (ref 6–23)
CO2: 30 mEq/L (ref 19–32)
Calcium: 9.1 mg/dL (ref 8.4–10.5)
Chloride: 99 mEq/L (ref 96–112)
Creatinine, Ser: 0.9 mg/dL (ref 0.4–1.2)
GFR: 62.67 mL/min (ref >60.00)
Glucose, Bld: 121 mg/dL — ABNORMAL HIGH (ref 70–99)
Potassium: 2.9 mEq/L — ABNORMAL LOW (ref 3.5–5.1)
Sodium: 139 mEq/L (ref 135–145)
Total Bilirubin: 1.1 mg/dL (ref 0.3–1.2)
Total Protein: 6.5 g/dL (ref 6.0–8.3)

## 2013-09-02 LAB — CBC WITH DIFFERENTIAL/PLATELET
Basophils Absolute: 0.1 10*3/uL (ref 0.0–0.1)
Basophils Relative: 1 % (ref 0.0–3.0)
Eosinophils Absolute: 0.1 10*3/uL (ref 0.0–0.7)
Eosinophils Relative: 1.1 % (ref 0.0–5.0)
HCT: 39.2 % (ref 36.0–46.0)
Hemoglobin: 13.3 g/dL (ref 12.0–15.0)
Lymphocytes Relative: 36.6 % (ref 12.0–46.0)
Lymphs Abs: 3.1 10*3/uL (ref 0.7–4.0)
MCHC: 33.9 g/dL (ref 30.0–36.0)
MCV: 91.2 fl (ref 78.0–100.0)
Monocytes Absolute: 0.5 10*3/uL (ref 0.1–1.0)
Monocytes Relative: 5.7 % (ref 3.0–12.0)
Neutro Abs: 4.7 10*3/uL (ref 1.4–7.7)
Neutrophils Relative %: 55.6 % (ref 43.0–77.0)
Platelets: 235 10*3/uL (ref 150.0–400.0)
RBC: 4.3 Mil/uL (ref 3.87–5.11)
RDW: 13.2 % (ref 11.5–14.6)
WBC: 8.4 10*3/uL (ref 4.5–10.5)

## 2013-09-02 LAB — PROTIME-INR
INR: 1.4 ratio — ABNORMAL HIGH (ref 0.8–1.0)
Prothrombin Time: 15.8 s — ABNORMAL HIGH (ref 10.2–12.4)

## 2013-09-02 NOTE — Telephone Encounter (Signed)
Dr. Gilford Rile was already notified via fax & pt came in the same day to repeat

## 2013-09-07 ENCOUNTER — Telehealth: Payer: Self-pay | Admitting: *Deleted

## 2013-09-07 NOTE — Telephone Encounter (Signed)
Pt is coming in tomorrow what labs and dx?  

## 2013-09-07 NOTE — Telephone Encounter (Signed)
BMP hypokalemia

## 2013-09-08 ENCOUNTER — Telehealth: Payer: Self-pay

## 2013-09-08 ENCOUNTER — Ambulatory Visit: Payer: Medicare Other

## 2013-09-08 ENCOUNTER — Other Ambulatory Visit (INDEPENDENT_AMBULATORY_CARE_PROVIDER_SITE_OTHER): Payer: Medicare Other

## 2013-09-08 VITALS — BP 124/68 | HR 72

## 2013-09-08 DIAGNOSIS — E876 Hypokalemia: Secondary | ICD-10-CM | POA: Diagnosis not present

## 2013-09-08 DIAGNOSIS — I10 Essential (primary) hypertension: Secondary | ICD-10-CM

## 2013-09-08 LAB — BASIC METABOLIC PANEL
BUN: 16 mg/dL (ref 6–23)
CO2: 28 mEq/L (ref 19–32)
Calcium: 8.7 mg/dL (ref 8.4–10.5)
Chloride: 105 mEq/L (ref 96–112)
Creatinine, Ser: 0.9 mg/dL (ref 0.4–1.2)
GFR: 63.48 mL/min (ref >60.00)
Glucose, Bld: 198 mg/dL — ABNORMAL HIGH (ref 70–99)
Potassium: 3.2 mEq/L — ABNORMAL LOW (ref 3.5–5.1)
Sodium: 141 mEq/L (ref 135–145)

## 2013-09-08 LAB — PROTIME-INR: INR: 2.7 — AB (ref 0.9–1.1)

## 2013-09-08 NOTE — Telephone Encounter (Signed)
Patient seen in office today for nurse visit to check blood pressure. The reading is as follows: 124/68 in Right arm at 10:44am. Had patient resting in chair for 72mins before checking blood pressure.

## 2013-09-09 ENCOUNTER — Telehealth: Payer: Self-pay | Admitting: Internal Medicine

## 2013-09-09 NOTE — Telephone Encounter (Signed)
Called and advised patient of results,  verbalized understanding. 

## 2013-09-09 NOTE — Telephone Encounter (Signed)
INR was perfect at 2.7. Continue coumadin same dose and repeat INR in 4 weeks.

## 2013-09-12 ENCOUNTER — Encounter: Payer: Self-pay | Admitting: Internal Medicine

## 2013-09-15 LAB — PROTIME-INR

## 2013-09-16 ENCOUNTER — Telehealth: Payer: Self-pay | Admitting: *Deleted

## 2013-09-16 DIAGNOSIS — E876 Hypokalemia: Secondary | ICD-10-CM

## 2013-09-16 MED ORDER — HYDROCHLOROTHIAZIDE 12.5 MG PO CAPS: 12.5000 mg | ORAL_CAPSULE | Freq: Every day | ORAL | Status: DC

## 2013-09-16 NOTE — Telephone Encounter (Addendum)
Spoke with pt, stopped her Triam-HCTZ 2 weeks ago due to abnormal labs. Since then has noticed bilateral lower leg and foot edema. Denies any calf pain, redness or warmth. Denies shortness of breath. Elevates legs when able to, edema decreased in the evenings

## 2013-09-16 NOTE — Telephone Encounter (Signed)
Per Dr. Gilford Rile, have pt start plain HCTZ 12.5 mg daily and recheck BMP Monday or Tuesday. Ok to call in #30. Pt notified. Rx sent to pharmacy by escript. Lab appt scheduled 09/20/13. Order placed for lab.

## 2013-09-20 ENCOUNTER — Telehealth: Payer: Self-pay | Admitting: *Deleted

## 2013-09-20 ENCOUNTER — Other Ambulatory Visit (INDEPENDENT_AMBULATORY_CARE_PROVIDER_SITE_OTHER): Payer: Medicare Other

## 2013-09-20 DIAGNOSIS — E876 Hypokalemia: Secondary | ICD-10-CM

## 2013-09-20 LAB — BASIC METABOLIC PANEL
BUN: 14 mg/dL (ref 6–23)
CO2: 31 mEq/L (ref 19–32)
Calcium: 8.9 mg/dL (ref 8.4–10.5)
Chloride: 98 mEq/L (ref 96–112)
Creatinine, Ser: 1 mg/dL (ref 0.4–1.2)
GFR: 57.47 mL/min — ABNORMAL LOW (ref >60.00)
Glucose, Bld: 214 mg/dL — ABNORMAL HIGH (ref 70–99)
Potassium: 3 mEq/L — ABNORMAL LOW (ref 3.5–5.1)
Sodium: 140 mEq/L (ref 135–145)

## 2013-09-20 NOTE — Telephone Encounter (Addendum)
Pt would like for someone to call her, both her legs are swelling she says and is having to use the bathroom every 2 hours, she says she started a new medication on Saturday doesn't know what its called

## 2013-09-20 NOTE — Telephone Encounter (Signed)
I spoke with pt in the hallway. She is going to try Compression Hose.

## 2013-09-20 NOTE — Telephone Encounter (Signed)
Pt spoke with Dr. Gilford Rile in the hallway.

## 2013-09-22 DIAGNOSIS — I4891 Unspecified atrial fibrillation: Secondary | ICD-10-CM | POA: Diagnosis not present

## 2013-09-22 LAB — PROTIME-INR

## 2013-09-27 ENCOUNTER — Encounter: Payer: Self-pay | Admitting: Internal Medicine

## 2013-09-27 ENCOUNTER — Ambulatory Visit (INDEPENDENT_AMBULATORY_CARE_PROVIDER_SITE_OTHER): Payer: Medicare Other | Admitting: Internal Medicine

## 2013-09-27 VITALS — BP 126/64 | HR 82 | Temp 98.0°F | Wt 127.5 lb

## 2013-09-27 DIAGNOSIS — R609 Edema, unspecified: Secondary | ICD-10-CM | POA: Diagnosis not present

## 2013-09-27 DIAGNOSIS — I4891 Unspecified atrial fibrillation: Secondary | ICD-10-CM

## 2013-09-27 MED ORDER — DIPHENOXYLATE-ATROPINE 2.5-0.025 MG PO TABS: ORAL_TABLET | ORAL | Status: DC

## 2013-09-27 NOTE — Assessment & Plan Note (Signed)
Symptomatically doing well with only rare palpitations with increased exertion. Continue Metoprolol, Diltiazem and Coumadin for anticoagulation. Continue MDINR for coumadin monitoring.

## 2013-09-27 NOTE — Progress Notes (Signed)
Subjective:    Patient ID: Kara [name], female    DOB: [birth year], 80 y.o.   MRN: [id number]  HPI 80YO female presents for follow up.  Edema - Improved with use of compression hose. Stopped HCTZ.  AFIB - Slightly more palpitations recently. No chest pain. Occasional mild shortness of breath with exertion. Taking Coumadin MWF 1.5mg  and other days 1 mg. Recent INR check have been near 2.5.  No other new concerns today.  Review of Systems  Constitutional: Negative for fever, chills, appetite change, fatigue and unexpected weight change.  HENT: Negative for congestion, ear pain, sinus pressure, sore throat, trouble swallowing and voice change.   Eyes: Negative for visual disturbance.  Respiratory: Positive for shortness of breath. Negative for cough, wheezing and stridor.   Cardiovascular: Positive for palpitations and leg swelling. Negative for chest pain.  Gastrointestinal: Negative for nausea, vomiting, abdominal pain, diarrhea, constipation, blood in stool, abdominal distention and anal bleeding.  Genitourinary: Negative for dysuria and flank pain.  Musculoskeletal: Negative for arthralgias, gait problem, myalgias and neck pain.  Skin: Negative for color change and rash.  Neurological: Negative for dizziness and headaches.  Hematological: Negative for adenopathy. Does not bruise/bleed easily.  Psychiatric/Behavioral: Negative for suicidal ideas, sleep disturbance and dysphoric mood. The patient is not nervous/anxious.        Objective:    BP 126/64  Pulse 82  Temp(Src) 98 F (36.7 C) (Oral)  Wt 127 lb 8 oz (57.834 kg)  SpO2 96% Physical Exam  Constitutional: She is oriented to person, place, and time. She appears well-developed and well-nourished. No distress.  HENT:  Head: Normocephalic and atraumatic.  Right Ear: External ear normal.  Left Ear: External ear normal.  Nose: Nose normal.  Mouth/Throat: Oropharynx is clear and moist. No oropharyngeal exudate.  Eyes:  Conjunctivae are normal. Pupils are equal, round, and reactive to light. Right eye exhibits no discharge. Left eye exhibits no discharge. No scleral icterus.  Neck: Normal range of motion. Neck supple. No tracheal deviation present. No thyromegaly present.  Cardiovascular: Normal rate, normal heart sounds and intact distal pulses.  An irregularly irregular rhythm present. Exam reveals no gallop and no friction rub.   No murmur heard. Pulmonary/Chest: Effort normal and breath sounds normal. No accessory muscle usage. Not tachypneic. No respiratory distress. She has no decreased breath sounds. She has no wheezes. She has no rhonchi. She has no rales. She exhibits no tenderness.  Musculoskeletal: Normal range of motion. She exhibits edema (BLE trace). She exhibits no tenderness.  Lymphadenopathy:    She has no cervical adenopathy.  Neurological: She is alert and oriented to person, place, and time. No cranial nerve deficit. She exhibits normal muscle tone. Coordination normal.  Skin: Skin is warm and dry. No rash noted. She is not diaphoretic. No erythema. No pallor.  Psychiatric: She has a normal mood and affect. Her behavior is normal. Judgment and thought content normal.          Assessment & Plan:   Problem List Items Addressed This Visit   Atrial fibrillation - Primary     Symptomatically doing well with only rare palpitations with increased exertion. Continue Metoprolol, Diltiazem and Coumadin for anticoagulation. Continue MDINR for coumadin monitoring.    Edema     BLE trace edema secondary to CVI. Encouraged her to continue to use compression hose. Discussed risks of diuretic medications including hypokalemia which has been a problem for her. Follow up later this month for recheck.  Return if symptoms worsen or fail to improve.

## 2013-09-27 NOTE — Progress Notes (Signed)
Pre visit review using our clinic review tool, if applicable. No additional management support is needed unless otherwise documented below in the visit note. 

## 2013-09-27 NOTE — Assessment & Plan Note (Signed)
BLE trace edema secondary to CVI. Encouraged her to continue to use compression hose. Discussed risks of diuretic medications including hypokalemia which has been a problem for her. Follow up later this month for recheck.

## 2013-09-28 ENCOUNTER — Encounter: Payer: Self-pay | Admitting: Internal Medicine

## 2013-09-29 LAB — PROTIME-INR

## 2013-10-06 LAB — PROTIME-INR

## 2013-10-10 ENCOUNTER — Other Ambulatory Visit: Payer: Self-pay | Admitting: Internal Medicine

## 2013-10-11 ENCOUNTER — Encounter: Payer: Self-pay | Admitting: Internal Medicine

## 2013-10-11 ENCOUNTER — Ambulatory Visit (INDEPENDENT_AMBULATORY_CARE_PROVIDER_SITE_OTHER): Payer: Medicare Other | Admitting: Internal Medicine

## 2013-10-11 VITALS — BP 132/62 | HR 68 | Temp 98.0°F | Ht 61.5 in | Wt 129.5 lb

## 2013-10-11 DIAGNOSIS — Z1239 Encounter for other screening for malignant neoplasm of breast: Secondary | ICD-10-CM

## 2013-10-11 DIAGNOSIS — Z0000 Encounter for general adult medical examination without abnormal findings: Secondary | ICD-10-CM

## 2013-10-11 DIAGNOSIS — K529 Noninfective gastroenteritis and colitis, unspecified: Secondary | ICD-10-CM

## 2013-10-11 DIAGNOSIS — Z7901 Long term (current) use of anticoagulants: Secondary | ICD-10-CM | POA: Diagnosis not present

## 2013-10-11 DIAGNOSIS — Z136 Encounter for screening for cardiovascular disorders: Secondary | ICD-10-CM | POA: Diagnosis not present

## 2013-10-11 DIAGNOSIS — I4891 Unspecified atrial fibrillation: Secondary | ICD-10-CM

## 2013-10-11 DIAGNOSIS — I2510 Atherosclerotic heart disease of native coronary artery without angina pectoris: Secondary | ICD-10-CM | POA: Insufficient documentation

## 2013-10-11 DIAGNOSIS — R197 Diarrhea, unspecified: Secondary | ICD-10-CM

## 2013-10-11 DIAGNOSIS — Z13220 Encounter for screening for lipoid disorders: Secondary | ICD-10-CM | POA: Diagnosis not present

## 2013-10-11 DIAGNOSIS — I495 Sick sinus syndrome: Secondary | ICD-10-CM | POA: Insufficient documentation

## 2013-10-11 LAB — COMPREHENSIVE METABOLIC PANEL
ALT: 18 U/L (ref 0–35)
AST: 20 U/L (ref 0–37)
Albumin: 3.2 g/dL — ABNORMAL LOW (ref 3.5–5.2)
Alkaline Phosphatase: 47 U/L (ref 39–117)
BUN: 15 mg/dL (ref 6–23)
CO2: 30 mEq/L (ref 19–32)
Calcium: 8.8 mg/dL (ref 8.4–10.5)
Chloride: 104 mEq/L (ref 96–112)
Creatinine, Ser: 0.9 mg/dL (ref 0.4–1.2)
GFR: 59.59 mL/min — ABNORMAL LOW (ref >60.00)
Glucose, Bld: 101 mg/dL — ABNORMAL HIGH (ref 70–99)
Potassium: 3.6 mEq/L (ref 3.5–5.1)
Sodium: 142 mEq/L (ref 135–145)
Total Bilirubin: 1.2 mg/dL (ref 0.2–1.2)
Total Protein: 6 g/dL (ref 6.0–8.3)

## 2013-10-11 LAB — LIPID PANEL
Cholesterol: 142 mg/dL (ref 0–200)
HDL: 43.1 mg/dL (ref >39.00)
LDL Cholesterol: 60 mg/dL (ref 0–99)
Total CHOL/HDL Ratio: 3
Triglycerides: 194 mg/dL — ABNORMAL HIGH (ref 0.0–149.0)
VLDL: 38.8 mg/dL (ref 0.0–40.0)

## 2013-10-11 LAB — PROTIME-INR
INR: 2 ratio — ABNORMAL HIGH (ref 0.8–1.0)
Prothrombin Time: 21.3 s — ABNORMAL HIGH (ref 9.6–13.1)

## 2013-10-11 NOTE — Patient Instructions (Signed)
Consider trying a FODMAP diet to help control diarrhea.  We will set up follow up with Dr. Tiffany Kocher.  Follow up here in 3 months or sooner as needed.
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
## 2013-10-11 NOTE — Assessment & Plan Note (Signed)
General medical exam including breast exam normal today. Will schedule mammogram. Discussed risks/benefits of mammogram at her age. Will set up follow up with GI for possible flex sig given chronic diarrhea. Last colonoscopy 2009. Immunizations UTD. Encouraged healthy diet and exercise such as walking.

## 2013-10-11 NOTE — Assessment & Plan Note (Signed)
Symptomatically doing well. Continue Metoprolol, Diltiazem, and anticoagulation with Coumadin. Will check INR with labs today.

## 2013-10-11 NOTE — Progress Notes (Signed)
Pre visit review using our clinic review tool, if applicable. No additional management support is needed unless otherwise documented below in the visit note. 

## 2013-10-11 NOTE — Assessment & Plan Note (Addendum)
Chronic diarrhea, with recent increase in "foamy" stools. Will set up follow up with GI. Given her age, question if follow up flex sig might be helpful for further evaluation. Will check electrolytes with labs today. Gave information on FODMAP diet.

## 2013-10-13 DIAGNOSIS — I2510 Atherosclerotic heart disease of native coronary artery without angina pectoris: Secondary | ICD-10-CM | POA: Diagnosis not present

## 2013-10-13 DIAGNOSIS — I10 Essential (primary) hypertension: Secondary | ICD-10-CM | POA: Diagnosis not present

## 2013-10-13 DIAGNOSIS — R609 Edema, unspecified: Secondary | ICD-10-CM | POA: Diagnosis not present

## 2013-10-13 DIAGNOSIS — I4891 Unspecified atrial fibrillation: Secondary | ICD-10-CM | POA: Diagnosis not present

## 2013-10-13 DIAGNOSIS — R0602 Shortness of breath: Secondary | ICD-10-CM | POA: Diagnosis not present

## 2013-10-13 LAB — PROTIME-INR

## 2013-10-14 ENCOUNTER — Encounter: Payer: Self-pay | Admitting: *Deleted

## 2013-10-14 ENCOUNTER — Encounter: Payer: Self-pay | Admitting: Internal Medicine

## 2013-10-17 ENCOUNTER — Encounter: Payer: Self-pay | Admitting: Internal Medicine

## 2013-10-18 ENCOUNTER — Encounter: Payer: Self-pay | Admitting: Internal Medicine

## 2013-10-20 DIAGNOSIS — I4891 Unspecified atrial fibrillation: Secondary | ICD-10-CM | POA: Diagnosis not present

## 2013-10-20 LAB — PROTIME-INR

## 2013-10-25 ENCOUNTER — Encounter: Payer: Self-pay | Admitting: Internal Medicine

## 2013-10-25 DIAGNOSIS — I10 Essential (primary) hypertension: Secondary | ICD-10-CM | POA: Diagnosis not present

## 2013-10-25 DIAGNOSIS — I739 Peripheral vascular disease, unspecified: Secondary | ICD-10-CM | POA: Diagnosis not present

## 2013-10-25 DIAGNOSIS — I8310 Varicose veins of unspecified lower extremity with inflammation: Secondary | ICD-10-CM | POA: Diagnosis not present

## 2013-10-25 DIAGNOSIS — I70219 Atherosclerosis of native arteries of extremities with intermittent claudication, unspecified extremity: Secondary | ICD-10-CM | POA: Diagnosis not present

## 2013-10-27 LAB — PROTIME-INR: INR: 2.3 — AB (ref 0.9–1.1)

## 2013-10-31 DIAGNOSIS — H35419 Lattice degeneration of retina, unspecified eye: Secondary | ICD-10-CM | POA: Diagnosis not present

## 2013-11-01 DIAGNOSIS — I459 Conduction disorder, unspecified: Secondary | ICD-10-CM | POA: Diagnosis not present

## 2013-11-03 LAB — PROTIME-INR: INR: 2.6 — AB (ref 0.9–1.1)

## 2013-11-04 ENCOUNTER — Encounter: Payer: Self-pay | Admitting: Internal Medicine

## 2013-11-10 LAB — PROTIME-INR

## 2013-11-22 DIAGNOSIS — R197 Diarrhea, unspecified: Secondary | ICD-10-CM | POA: Diagnosis not present

## 2013-11-24 DIAGNOSIS — I4891 Unspecified atrial fibrillation: Secondary | ICD-10-CM | POA: Diagnosis not present

## 2013-11-24 LAB — PROTIME-INR: INR: 2.5 — AB (ref 0.9–1.1)

## 2013-11-30 ENCOUNTER — Other Ambulatory Visit: Payer: Self-pay | Admitting: Internal Medicine

## 2013-11-30 ENCOUNTER — Encounter: Payer: Self-pay | Admitting: Internal Medicine

## 2013-12-05 ENCOUNTER — Encounter: Payer: Self-pay | Admitting: Internal Medicine

## 2013-12-05 ENCOUNTER — Telehealth: Payer: Self-pay | Admitting: Internal Medicine

## 2013-12-05 NOTE — Telephone Encounter (Signed)
INR 12/01/13 - 2.4.  Notify pt INR stable.  Stay on same coumadin dose and recheck pt/inr in one week.  (it looks like these are being checked weekly).

## 2013-12-07 NOTE — Telephone Encounter (Signed)
Called and advised patient of results,  verbalized understanding. 

## 2013-12-08 ENCOUNTER — Other Ambulatory Visit: Payer: Self-pay | Admitting: Unknown Physician Specialty

## 2013-12-08 DIAGNOSIS — R197 Diarrhea, unspecified: Secondary | ICD-10-CM | POA: Diagnosis not present

## 2013-12-08 LAB — CLOSTRIDIUM DIFFICILE(ARMC)

## 2013-12-08 LAB — PROTIME-INR: INR: 2.9 — AB (ref 0.9–1.1)

## 2013-12-10 LAB — STOOL CULTURE

## 2013-12-15 ENCOUNTER — Encounter: Payer: Self-pay | Admitting: Internal Medicine

## 2013-12-15 LAB — PROTIME-INR
INR: 2.6 — AB (ref 0.9–1.1)
INR: 2.4 — AB (ref 0.9–1.1)

## 2013-12-22 DIAGNOSIS — I4891 Unspecified atrial fibrillation: Secondary | ICD-10-CM | POA: Diagnosis not present

## 2013-12-22 DIAGNOSIS — L821 Other seborrheic keratosis: Secondary | ICD-10-CM | POA: Diagnosis not present

## 2013-12-22 LAB — PROTIME-INR: INR: 2.5 — AB (ref 0.9–1.1)

## 2013-12-26 ENCOUNTER — Other Ambulatory Visit: Payer: Self-pay | Admitting: Internal Medicine

## 2013-12-29 LAB — PROTIME-INR: INR: 2.7 — AB (ref 0.9–1.1)

## 2014-01-05 LAB — PROTIME-INR: INR: 2.3 — AB (ref 0.9–1.1)

## 2014-01-10 ENCOUNTER — Encounter: Payer: Self-pay | Admitting: Internal Medicine

## 2014-01-12 LAB — PROTIME-INR: INR: 3 — AB (ref 0.9–1.1)

## 2014-01-13 ENCOUNTER — Encounter: Payer: Self-pay | Admitting: Internal Medicine

## 2014-01-17 DIAGNOSIS — R197 Diarrhea, unspecified: Secondary | ICD-10-CM | POA: Diagnosis not present

## 2014-01-20 DIAGNOSIS — I4891 Unspecified atrial fibrillation: Secondary | ICD-10-CM | POA: Diagnosis not present

## 2014-01-20 LAB — PROTIME-INR

## 2014-01-27 LAB — PROTIME-INR

## 2014-02-02 LAB — PROTIME-INR: INR: 1.9 — AB (ref 0.9–1.1)

## 2014-02-09 LAB — PROTIME-INR: INR: 2.3 — AB (ref 0.9–1.1)

## 2014-02-16 DIAGNOSIS — I4891 Unspecified atrial fibrillation: Secondary | ICD-10-CM | POA: Diagnosis not present

## 2014-02-16 LAB — PROTIME-INR: INR: 1.8 — AB (ref 0.9–1.1)

## 2014-02-24 LAB — PROTIME-INR: INR: 1.8 — AB (ref 0.9–1.1)

## 2014-02-27 ENCOUNTER — Encounter: Payer: Self-pay | Admitting: Internal Medicine

## 2014-03-01 ENCOUNTER — Encounter: Payer: Self-pay | Admitting: Internal Medicine

## 2014-03-03 ENCOUNTER — Encounter: Payer: Self-pay | Admitting: Internal Medicine

## 2014-03-03 LAB — PROTIME-INR: INR: 2.2 — AB (ref 0.9–1.1)

## 2014-03-08 ENCOUNTER — Telehealth: Payer: Self-pay | Admitting: *Deleted

## 2014-03-08 NOTE — Telephone Encounter (Signed)
Pt was scheduled for Monday with Dr. Nicki Reaper for Fatigue & Nausea. Please advise if patient needs to be seen sooner

## 2014-03-08 NOTE — Telephone Encounter (Signed)
Pt states that she is not having any problems with keeping liquids or solids down, no abdominal pain.  She just doesn't feel well and states that her urine has been dark and she is feeling fatigued. She says that she has not been seen in a while and feels like she needs a follow up with Dr Gilford Rile, she did not see on her last visit that she was supposed to have a follow up in 3 mths, which would have been in Aug.  I had Melissa get an appt at [facility] tomorrow (no availability in our office) to have her urine checked for UTI and schedule her a follow up appt with Dr Gilford Rile.   Cancelled scheduled appt on 03/13/14 with Dr Nicki Reaper

## 2014-03-08 NOTE — Telephone Encounter (Signed)
If having nausea and not able to keep down liquids and solids, or if she has abdominal pain, she needs to be seen today. Has a history of pancreatitis.

## 2014-03-09 ENCOUNTER — Encounter: Payer: Self-pay | Admitting: Family Medicine

## 2014-03-09 ENCOUNTER — Ambulatory Visit (INDEPENDENT_AMBULATORY_CARE_PROVIDER_SITE_OTHER): Payer: Medicare Other | Admitting: Family Medicine

## 2014-03-09 ENCOUNTER — Telehealth: Payer: Self-pay | Admitting: Family Medicine

## 2014-03-09 VITALS — BP 118/62 | HR 78 | Temp 97.5°F | Wt 129.8 lb

## 2014-03-09 DIAGNOSIS — R5383 Other fatigue: Secondary | ICD-10-CM | POA: Diagnosis not present

## 2014-03-09 DIAGNOSIS — N3001 Acute cystitis with hematuria: Secondary | ICD-10-CM

## 2014-03-09 DIAGNOSIS — Z8719 Personal history of other diseases of the digestive system: Secondary | ICD-10-CM

## 2014-03-09 DIAGNOSIS — R339 Retention of urine, unspecified: Secondary | ICD-10-CM | POA: Diagnosis not present

## 2014-03-09 DIAGNOSIS — R5381 Other malaise: Secondary | ICD-10-CM | POA: Insufficient documentation

## 2014-03-09 DIAGNOSIS — N390 Urinary tract infection, site not specified: Secondary | ICD-10-CM | POA: Insufficient documentation

## 2014-03-09 LAB — CBC WITH DIFFERENTIAL/PLATELET
Basophils Absolute: 0 10*3/uL (ref 0.0–0.1)
Basophils Relative: 0.3 % (ref 0.0–3.0)
Eosinophils Absolute: 0.1 10*3/uL (ref 0.0–0.7)
Eosinophils Relative: 1 % (ref 0.0–5.0)
HCT: 40.9 % (ref 36.0–46.0)
Hemoglobin: 13.6 g/dL (ref 12.0–15.0)
Lymphocytes Relative: 36.9 % (ref 12.0–46.0)
Lymphs Abs: 2.6 10*3/uL (ref 0.7–4.0)
MCHC: 33.1 g/dL (ref 30.0–36.0)
MCV: 89.2 fl (ref 78.0–100.0)
Monocytes Absolute: 0.5 10*3/uL (ref 0.1–1.0)
Monocytes Relative: 6.8 % (ref 3.0–12.0)
Neutro Abs: 3.9 10*3/uL (ref 1.4–7.7)
Neutrophils Relative %: 55 % (ref 43.0–77.0)
Platelets: 213 10*3/uL (ref 150.0–400.0)
RBC: 4.59 Mil/uL (ref 3.87–5.11)
RDW: 13.9 % (ref 11.5–15.5)
WBC: 7.1 10*3/uL (ref 4.0–10.5)

## 2014-03-09 LAB — POCT URINALYSIS DIPSTICK
Bilirubin, UA: NEGATIVE
Glucose, UA: NEGATIVE
Ketones, UA: NEGATIVE
Nitrite, UA: NEGATIVE
Spec Grav, UA: 1.02
Urobilinogen, UA: 0.2
pH, UA: 6

## 2014-03-09 MED ORDER — CIPROFLOXACIN HCL 250 MG PO TABS: 250.0000 mg | ORAL_TABLET | Freq: Two times a day (BID) | ORAL | Status: DC

## 2014-03-09 NOTE — Telephone Encounter (Signed)
Please schedule pt (I haven't talked with her to verify if she can come that day/time)

## 2014-03-09 NOTE — Telephone Encounter (Signed)
Pt came to see Dr. Deborra Medina today to rule out UTI and needs to follow up with someone from [facility] next week. I do not see open slots and told pt someone from your office would call to get her scheduled. Thanks!

## 2014-03-09 NOTE — Assessment & Plan Note (Signed)
UA positive for blood, LE and protein. Last urine cx in Epic was from 08/2011- does not have recurrent UTIs but is followed by urology yearly. No h/o kidney stones and history not consistent with kidney stones. Will treat UTI with cipro 250 mg twice daily x 5 days. She is taking coumadin -checks her PT at home weekly. She is aware that antibiotics can thin blood- she will look out for this. Follow up with PCP next week- I will ask front office to reschedule the cancelled appointment.

## 2014-03-09 NOTE — Patient Instructions (Signed)
It was nice to meet you. Please take cipro as directed- 1 tablet twice daily for 5 days. I will call you with your lab results.

## 2014-03-09 NOTE — Progress Notes (Signed)
Subjective:    Patient ID: Kara [name], female    DOB: Sep 10, 1922, 80 y.o.   MRN: [id number]  HPI  Pleasant 80 yo female pt of Dr. Gilford Rile, new to me, with complicated medical history here for several issues.  Did have an appointment with Dr. Nicki Reaper on Monday but pt says it was cancelled by their office.  She would like to address the following today.  Per phone note, she is here for rule out UTI.  She is complaining of fatigue and "feeling bad" for past 2 months. Has noticed some blood in her urine but she is on coumadin (h/o afib) so didn't think much of it.  No dysuria or increased urinary frequency. She is followed by urology.  Past few days, she has had intermittent "faint" nausea.  No vomiting.  No abdominal pain. No new back pain.  No fevers.  No h/o kidney stones.  Does have a h/o pancreatitis.  Current Outpatient Prescriptions on File Prior to Visit  Medication Sig Dispense Refill  . Calcium Carbonate-Vitamin D (CALCIUM-D) 600-400 MG-UNIT TABS Take 1 tablet by mouth daily.      . cholecalciferol (VITAMIN D) 1000 UNITS tablet Take 1,000 Units by mouth daily.      Marland Kitchen COUMADIN 1 MG tablet TAKE 1 MG AND 1.5 MG ALTERNATING  45 tablet  3  . cyanocobalamin (,VITAMIN B-12,) 1000 MCG/ML injection INJECT 1 ML  INTO THE MUSCLE EVERY 30 DAYS.  1 mL  11  . diltiazem (CARDIZEM CD) 120 MG 24 hr capsule Take 1 capsule (120 mg total) by mouth daily.  30 capsule  6  . Dimethicone-Zinc Oxide-Vit A-D (A & D ZINC OXIDE) CREA Apply 1 application topically 2 (two) times daily as needed.  113 g  2  . diphenoxylate-atropine (LOMOTIL) 2.5-0.025 MG per tablet TAKE 1 TABLET FOUR TIMES DAILY AS NEEDED FOR DIARRHEA OR LOOSE STOOLS  90 tablet  1  . hydrocortisone-pramoxine (ANALPRAM-HC SINGLES) 2.5-1 % rectal cream Place rectally 3 (three) times daily.  30 g  0  . Hypromellose (GENTEAL) 0.3 % SOLN Apply 1 drop to eye daily.      Marland Kitchen loperamide (IMODIUM) 2 MG capsule Take 2 mg by mouth every 8 (eight) hours  as needed.      . metoprolol (LOPRESSOR) 50 MG tablet TAKE 1 TABLET BY MOUTH 2  TIMES DAILY.  60 tablet  4  . nystatin (MYCOSTATIN) powder Apply topically 4 (four) times daily as needed.      . warfarin (COUMADIN) 1 MG tablet Take 1 mg by mouth daily. Take 1 mg every other day and 1.5 mg all other days       No current facility-administered medications on file prior to visit.    Allergies  Allergen Reactions  . Baycol [Cerivastatin Sodium]   . Cardizem [Diltiazem Hcl]     LOWER EXTREMITY EDEMA  . Crestor [Rosuvastatin Calcium] Other (See Comments)    INTOLERANT  . Pravachol     INTOLERANT  . Vioxx [Rofecoxib]   . Zocor [Simvastatin]     INTOLERANT    Past Medical History  Diagnosis Date  . Hypertension   . Atrial fibrillation   . CAD (coronary artery disease)   . Chronic diarrhea   . GERD (gastroesophageal reflux disease)   . Hyperlipidemia   . History of colon polyps   . Glaucoma   . Fibrocystic breast disease   . Cancer     UTERINE  . Allergy   .  IBS (irritable bowel syndrome)   . Plantar fasciitis   . Edema of lower extremity   . Sleep apnea, obstructive   . Hematuria     Past Surgical History  Procedure Laterality Date  . Breast biopsy  1970's  . Appendectomy  1940's  . Tonsillectomy  1936  . Abdominal hysterectomy  1980's  . Refractive surgery      for bilateral glaumoma  . Pacemaker placement    . Abdominal surgery      for villous polyp,,,many years ago  . Ascad, s/p ptca  11/28/2005    MID LESION     Family History  Problem Relation Age of Onset  . Cancer    . Diabetes    . Cancer Mother     COLON  . Coronary artery disease Father   . Kidney disease Father     History   Social History  . Marital Status: Married    Spouse Name: N/A    Number of Children: 3  . Years of Education: N/A   Occupational History  . Retired Restaurant manager, fast food - primary    Social History Main Topics  . Smoking status: Former Research scientist (life sciences)  . Smokeless tobacco:  Never Used  . Alcohol Use: Yes     Comment: Rarely  . Drug Use: No  . Sexual Activity: Not on file   Other Topics Concern  . Not on file   Social History Narrative   Lives at [place]. Born in [place]. Travels frequently.      1 daughter, 2 step children      Regular Exercise -  NO   Daily Caffeine Use:  1 coffee            The PMH, PSH, Social History, Family History, Medications, and allergies have been reviewed in [facility], and have been updated if relevant.   Review of Systems  Constitutional: Positive for fatigue. Negative for fever, chills and appetite change.  Gastrointestinal: Negative for nausea, vomiting, abdominal pain, diarrhea, constipation and abdominal distention.  Genitourinary: Positive for frequency. Negative for dysuria, flank pain and difficulty urinating.  Skin: Negative.    See HPI    Objective:   Physical Exam  Nursing note and vitals reviewed. Constitutional: She appears well-developed and well-nourished. No distress.  HENT:  Head: Normocephalic.  Abdominal: Soft. Bowel sounds are normal. She exhibits no distension. There is no rigidity, no rebound, no guarding, no CVA tenderness, no tenderness at McBurney's point and negative Murphy's sign.  Skin: Skin is warm, dry and intact.  Psychiatric: She has a normal mood and affect. Her speech is normal and behavior is normal. Judgment and thought content normal. Cognition and memory are normal.   BP 118/62  Pulse 78  Temp(Src) 97.5 F (36.4 C) (Oral)  Wt 129 lb 12 oz (58.854 kg)  SpO2 97% Wt Readings from Last 3 Encounters:  03/09/14 129 lb 12 oz (58.854 kg)  10/11/13 129 lb 8 oz (58.741 kg)  09/27/13 127 lb 8 oz (57.834 kg)          Assessment & Plan:

## 2014-03-09 NOTE — Assessment & Plan Note (Signed)
Mild epigastric tenderness on exam with mild nausea. Unlikely pancreatitis, but given history, will check lipase today.

## 2014-03-09 NOTE — Assessment & Plan Note (Signed)
New- progressive for past two months. Will start work up with lab work today and she will follow up with PCP next week. The patient indicates understanding of these issues and agrees with the plan. Orders Placed This Encounter  Procedures  . Urine culture  . CBC with Differential  . Comprehensive metabolic panel  . Lipase  . TSH  . Urinalysis Dipstick

## 2014-03-09 NOTE — Addendum Note (Signed)
Addended by: Marchia Bond on: 03/09/2014 03:10 PM   Modules accepted: Orders

## 2014-03-09 NOTE — Telephone Encounter (Signed)
Please see below.

## 2014-03-09 NOTE — Telephone Encounter (Signed)
We can add her at 2:30pm on 10/29 for 31min visit.

## 2014-03-09 NOTE — Progress Notes (Signed)
Pre visit review using our clinic review tool, if applicable. No additional management support is needed unless otherwise documented below in the visit note. 

## 2014-03-10 LAB — COMPREHENSIVE METABOLIC PANEL
ALT: 20 U/L (ref 0–35)
AST: 25 U/L (ref 0–37)
Albumin: 3.3 g/dL — ABNORMAL LOW (ref 3.5–5.2)
Alkaline Phosphatase: 67 U/L (ref 39–117)
BUN: 21 mg/dL (ref 6–23)
CO2: 26 mEq/L (ref 19–32)
Calcium: 9.1 mg/dL (ref 8.4–10.5)
Chloride: 104 mEq/L (ref 96–112)
Creatinine, Ser: 1 mg/dL (ref 0.4–1.2)
GFR: 56.08 mL/min — ABNORMAL LOW (ref >60.00)
Glucose, Bld: 122 mg/dL — ABNORMAL HIGH (ref 70–99)
Potassium: 3.6 mEq/L (ref 3.5–5.1)
Sodium: 142 mEq/L (ref 135–145)
Total Bilirubin: 1.3 mg/dL — ABNORMAL HIGH (ref 0.2–1.2)
Total Protein: 7 g/dL (ref 6.0–8.3)

## 2014-03-10 LAB — PROTIME-INR

## 2014-03-10 LAB — URINE CULTURE: Colony Count: >=100000

## 2014-03-10 LAB — TSH: TSH: 3.49 u[IU]/mL (ref 0.35–4.50)

## 2014-03-10 LAB — LIPASE: Lipase: 34 U/L (ref 11.0–59.0)

## 2014-03-10 NOTE — Telephone Encounter (Signed)
The patient has been scheduled please contact.

## 2014-03-10 NOTE — Telephone Encounter (Signed)
Notified pt. 

## 2014-03-13 ENCOUNTER — Ambulatory Visit: Payer: Medicare Other | Admitting: Internal Medicine

## 2014-03-13 ENCOUNTER — Telehealth: Payer: Self-pay | Admitting: *Deleted

## 2014-03-13 NOTE — Telephone Encounter (Signed)
INR low.  Need to confirm what dose of coumadin she is taking. Skipped any doses?   Is there any other INR between the 01/31/14 INR and this one.  If so, need results.

## 2014-03-13 NOTE — Telephone Encounter (Signed)
Please see below result

## 2014-03-13 NOTE — Telephone Encounter (Signed)
[place] with MD INR   INR: 1.5 (Oct 23rd)

## 2014-03-14 NOTE — Telephone Encounter (Signed)
Pt takes 1.5 mg Monday, wed and fri and takes 1 mg on tues, thurs, sat and sun.  Pt has been on Cipro, yesterday was her last day of this medication.

## 2014-03-14 NOTE — Telephone Encounter (Signed)
Change coumadin to 1mg  on Thursday and Sunday and 1.5mg  all other days.  Recheck pt/inr in one week to confirm increasing.

## 2014-03-14 NOTE — Telephone Encounter (Signed)
Notified pt. 

## 2014-03-16 ENCOUNTER — Ambulatory Visit (INDEPENDENT_AMBULATORY_CARE_PROVIDER_SITE_OTHER): Payer: Medicare Other | Admitting: Internal Medicine

## 2014-03-16 ENCOUNTER — Encounter: Payer: Self-pay | Admitting: Internal Medicine

## 2014-03-16 VITALS — BP 132/82 | HR 103 | Temp 98.0°F | Ht 61.5 in | Wt 133.0 lb

## 2014-03-16 DIAGNOSIS — N3000 Acute cystitis without hematuria: Secondary | ICD-10-CM

## 2014-03-16 DIAGNOSIS — Z7901 Long term (current) use of anticoagulants: Secondary | ICD-10-CM

## 2014-03-16 DIAGNOSIS — I4891 Unspecified atrial fibrillation: Secondary | ICD-10-CM | POA: Diagnosis not present

## 2014-03-16 LAB — POCT URINALYSIS DIPSTICK
Bilirubin, UA: NEGATIVE
Glucose, UA: NEGATIVE
Ketones, UA: NEGATIVE
Protein, UA: NEGATIVE
Spec Grav, UA: 1.01
Urobilinogen, UA: 0.2
pH, UA: 6.5

## 2014-03-16 LAB — PROTIME-INR

## 2014-03-16 MED ORDER — SULFAMETHOXAZOLE-TMP DS 800-160 MG PO TABS: 1.0000 | ORAL_TABLET | Freq: Two times a day (BID) | ORAL | Status: DC

## 2014-03-16 NOTE — Progress Notes (Signed)
Pre visit review using our clinic review tool, if applicable. No additional management support is needed unless otherwise documented below in the visit note. 

## 2014-03-16 NOTE — Patient Instructions (Signed)
Start Bactrim twice daily to help treat urinary tract infection.  We will send the urine for a culture and call you with results.  Check Coumadin level tonight.  Follow up in 2 weeks or sooner as needed.

## 2014-03-16 NOTE — Assessment & Plan Note (Signed)
Starting on Bactrim. Recent INR was low at 1.5, however given new antibiotic, will go back to original dosing of 1mg  daily except MWF 1.5mg  daily. INR today and early next week.

## 2014-03-16 NOTE — Progress Notes (Signed)
Subjective:    Patient ID: Kara [name], female    DOB: August 21, 1922, 80 y.o.   MRN: [id number]  HPI 80YO female presents for follow up.  Recently seen at our [facility] office. Treated for UTI with cipro. Urine culture showed mixed bacteria. Completed Cipro. Continues to have some burning with urination and urgency. No fever or chills. No flank pain.  No other new concerns today. Recent INR was low at 1.5. Her dose of Coumadin was adjusted up to 1.5mg  MTWFS. She denies any easy bruising or bleeding.   Review of Systems  Constitutional: Positive for fatigue. Negative for fever, chills, appetite change and unexpected weight change.  Eyes: Negative for visual disturbance.  Respiratory: Negative for shortness of breath.   Cardiovascular: Negative for chest pain and leg swelling.  Gastrointestinal: Negative for nausea, vomiting, abdominal pain, diarrhea, constipation and blood in stool.  Genitourinary: Positive for dysuria and frequency. Negative for hematuria.  Musculoskeletal: Negative for arthralgias and myalgias.  Skin: Negative for color change and rash.  Hematological: Negative for adenopathy. Does not bruise/bleed easily.  Psychiatric/Behavioral: Negative for dysphoric mood. The patient is not nervous/anxious.        Objective:    BP 132/82  Pulse 103  Temp(Src) 98 F (36.7 C) (Oral)  Ht 5' 1.5" (1.562 m)  Wt 133 lb (60.328 kg)  BMI 24.73 kg/m2  SpO2 92% Physical Exam  Constitutional: She is oriented to person, place, and time. She appears well-developed and well-nourished. No distress.  HENT:  Head: Normocephalic and atraumatic.  Right Ear: External ear normal.  Left Ear: External ear normal.  Nose: Nose normal.  Mouth/Throat: Oropharynx is clear and moist. No oropharyngeal exudate.  Eyes: Conjunctivae are normal. Pupils are equal, round, and reactive to light. Right eye exhibits no discharge. Left eye exhibits no discharge. No scleral icterus.  Neck: Normal range  of motion. Neck supple. No tracheal deviation present. No thyromegaly present.  Cardiovascular: Normal rate, regular rhythm, normal heart sounds and intact distal pulses.  Exam reveals no gallop and no friction rub.   No murmur heard. Pulmonary/Chest: Effort normal and breath sounds normal. No accessory muscle usage. Not tachypneic. No respiratory distress. She has no decreased breath sounds. She has no wheezes. She has no rhonchi. She has no rales. She exhibits no tenderness.  Abdominal: There is no tenderness (no CVA tenderness).  Musculoskeletal: Normal range of motion. She exhibits no edema and no tenderness.  Lymphadenopathy:    She has no cervical adenopathy.  Neurological: She is alert and oriented to person, place, and time. No cranial nerve deficit. She exhibits normal muscle tone. Coordination normal.  Skin: Skin is warm and dry. No rash noted. She is not diaphoretic. No erythema. No pallor.  Psychiatric: She has a normal mood and affect. Her behavior is normal. Judgment and thought content normal.          Assessment & Plan:   Problem List Items Addressed This Visit     Unprioritized   Chronic anticoagulation     Starting on Bactrim. Recent INR was low at 1.5, however given new antibiotic, will go back to original dosing of 1mg  daily except MWF 1.5mg  daily. INR today and early next week.    UTI (urinary tract infection) - Primary     Recent UTI treated with Cipro, now with persistent symptoms. UA pos for leuk and blood. Will send urine for repeat culture. Last culture showed >100K colonies mixed bacteria. Start Bactrim. Discussed risks of  using antibiotics with Coumadin. She will check INR today and early next week.     Relevant Medications      sulfamethoxazole-trimethoprim (BACTRIM/SEPTRA DS) tablet 800-160 mg   Other Relevant Orders      POCT Urinalysis Dipstick (Completed)      CULTURE, URINE COMPREHENSIVE       Return in about 2 weeks (around 03/30/2014).

## 2014-03-16 NOTE — Assessment & Plan Note (Signed)
Recent UTI treated with Cipro, now with persistent symptoms. UA pos for leuk and blood. Will send urine for repeat culture. Last culture showed >100K colonies mixed bacteria. Start Bactrim. Discussed risks of using antibiotics with Coumadin. She will check INR today and early next week.

## 2014-03-19 LAB — CULTURE, URINE COMPREHENSIVE: Colony Count: >=100000

## 2014-03-23 ENCOUNTER — Telehealth: Payer: Self-pay | Admitting: Internal Medicine

## 2014-03-23 LAB — PROTIME-INR: INR: 3.2 — AB (ref 0.9–1.1)

## 2014-03-23 NOTE — Telephone Encounter (Signed)
Pt notified and verbalized understanding.

## 2014-03-23 NOTE — Telephone Encounter (Signed)
Left vm for pt to return my call.  

## 2014-03-23 NOTE — Telephone Encounter (Signed)
Coumadin level was elevated at 3.2. Please ask pt to hold Coumadin tonight and then restart medication same dose. Repeat INR Monday.

## 2014-03-23 NOTE — Telephone Encounter (Signed)
She should complete antibiotics. We can call in Diflucan 150mg  po x 1

## 2014-03-23 NOTE — Telephone Encounter (Signed)
Pt notified and  verbalized understanding. Pt also stated she has been on antibiotics, having itching, symptoms of yeast infection. Nurse where she lives recommended Monistat and probiotic. She took fist dose last night, wanted to know if to continue or needs Rx. Starting to have itching again today.

## 2014-03-24 ENCOUNTER — Other Ambulatory Visit: Payer: Self-pay | Admitting: *Deleted

## 2014-03-24 MED ORDER — FLUCONAZOLE 150 MG PO TABS: 150.0000 mg | ORAL_TABLET | Freq: Once | ORAL | Status: DC

## 2014-03-24 NOTE — Telephone Encounter (Signed)
The patient called and is at [facility], she states they do no have the medication that was suppose to be called in.  She is waiting at [facility] for this to be resolved. Thanks!

## 2014-03-24 NOTE — Telephone Encounter (Signed)
Is she waiting on the Diflucan? Or another medication?

## 2014-03-28 LAB — PROTIME-INR

## 2014-03-31 ENCOUNTER — Ambulatory Visit (INDEPENDENT_AMBULATORY_CARE_PROVIDER_SITE_OTHER): Payer: Medicare Other | Admitting: Internal Medicine

## 2014-03-31 ENCOUNTER — Encounter: Payer: Self-pay | Admitting: Internal Medicine

## 2014-03-31 VITALS — BP 124/80 | HR 85 | Temp 96.7°F | Ht 61.5 in | Wt 131.5 lb

## 2014-03-31 DIAGNOSIS — N390 Urinary tract infection, site not specified: Secondary | ICD-10-CM

## 2014-03-31 DIAGNOSIS — R319 Hematuria, unspecified: Secondary | ICD-10-CM | POA: Diagnosis not present

## 2014-03-31 DIAGNOSIS — N3001 Acute cystitis with hematuria: Secondary | ICD-10-CM | POA: Diagnosis not present

## 2014-03-31 LAB — POCT URINALYSIS DIPSTICK
Bilirubin, UA: NEGATIVE
Glucose, UA: NEGATIVE
Ketones, UA: NEGATIVE
Nitrite, UA: NEGATIVE
Protein, UA: NEGATIVE
Spec Grav, UA: <=1.005
Urobilinogen, UA: 0.2
pH, UA: 5.5

## 2014-03-31 MED ORDER — SULFAMETHOXAZOLE-TRIMETHOPRIM 800-160 MG PO TABS: 1.0000 | ORAL_TABLET | Freq: Two times a day (BID) | ORAL | Status: DC

## 2014-03-31 MED ORDER — FLUCONAZOLE 150 MG PO TABS: 150.0000 mg | ORAL_TABLET | ORAL | Status: DC

## 2014-03-31 NOTE — Assessment & Plan Note (Signed)
Symptoms of persistent UTI with dysuria. UA pos for blood. Will resend urine culture. Restart Bactrim for additional week of treatment, as previous culture showed E. Coli sensitive to bactrim. Will add Diflucan given yeast infection with repeated courses of antibiotics. She understands need to monitor Coumadin dosing closely while on antibiotics. Will continue MDINR. Follow up in 2 weeks.

## 2014-03-31 NOTE — Progress Notes (Signed)
Pre visit review using our clinic review tool, if applicable. No additional management support is needed unless otherwise documented below in the visit note. 

## 2014-03-31 NOTE — Patient Instructions (Addendum)
Start Bactrim twice daily for 1 week to treat urinary tract infection.  Start Diflucan 150mg  weekly for three weeks.  We will call you with the urine culture results.  Continue to monitor Coumadin level weekly.  Follow up in 2 weeks or sooner as needed.

## 2014-03-31 NOTE — Progress Notes (Signed)
Subjective:    Patient ID: Kara [name], female    DOB: Oct 08, 1922, 80 y.o.   MRN: [id number]  HPI 80YO female presents for follow up.  UTI - She does not feel that symptoms have completely resolved. Continues to have a little bit of burning, esp in the mornings. No hematuria. No fever, chills, flank pain.  Review of Systems  Constitutional: Negative for fever, chills and fatigue.  Gastrointestinal: Negative for nausea, vomiting, abdominal pain, diarrhea, constipation and rectal pain.  Genitourinary: Positive for dysuria. Negative for urgency, frequency, hematuria, flank pain, decreased urine volume, vaginal bleeding, vaginal discharge, difficulty urinating, vaginal pain and pelvic pain.       Objective:    BP 124/80 mmHg  Pulse 85  Temp(Src) 96.7 F (35.9 C) (Axillary)  Ht 5' 1.5" (1.562 m)  Wt 131 lb 8 oz (59.648 kg)  BMI 24.45 kg/m2  SpO2 95% Physical Exam  Constitutional: She is oriented to person, place, and time. She appears well-developed and well-nourished. No distress.  HENT:  Head: Normocephalic and atraumatic.  Right Ear: External ear normal.  Left Ear: External ear normal.  Nose: Nose normal.  Mouth/Throat: Oropharynx is clear and moist. No oropharyngeal exudate.  Eyes: Conjunctivae are normal. Pupils are equal, round, and reactive to light. Right eye exhibits no discharge. Left eye exhibits no discharge. No scleral icterus.  Neck: Normal range of motion. Neck supple. No tracheal deviation present. No thyromegaly present.  Cardiovascular: Normal rate, regular rhythm, normal heart sounds and intact distal pulses.  Exam reveals no gallop and no friction rub.   No murmur heard. Pulmonary/Chest: Effort normal and breath sounds normal. No accessory muscle usage. No tachypnea. No respiratory distress. She has no decreased breath sounds. She has no wheezes. She has no rhonchi. She has no rales. She exhibits no tenderness.  Musculoskeletal: Normal range of motion. She  exhibits no edema or tenderness.  Lymphadenopathy:    She has no cervical adenopathy.  Neurological: She is alert and oriented to person, place, and time. No cranial nerve deficit. She exhibits normal muscle tone. Coordination normal.  Skin: Skin is warm and dry. No rash noted. She is not diaphoretic. No erythema. No pallor.  Psychiatric: She has a normal mood and affect. Her behavior is normal. Judgment and thought content normal.          Assessment & Plan:   Problem List Items Addressed This Visit      Unprioritized   UTI (urinary tract infection) - Primary    Symptoms of persistent UTI with dysuria. UA pos for blood. Will resend urine culture. Restart Bactrim for additional week of treatment, as previous culture showed E. Coli sensitive to bactrim. Will add Diflucan given yeast infection with repeated courses of antibiotics. She understands need to monitor Coumadin dosing closely while on antibiotics. Will continue MDINR. Follow up in 2 weeks.    Relevant Medications      fluconazole (DIFLUCAN) tablet 150 mg      sulfamethoxazole-trimethoprim (BACTRIM DS) tablet 800-160 mg   Other Relevant Orders      Urine culture      POCT urinalysis dipstick (Completed)    Other Visit Diagnoses    Urinary tract infection with hematuria, site unspecified        Relevant Medications       fluconazole (DIFLUCAN) tablet 150 mg       sulfamethoxazole-trimethoprim (BACTRIM DS) tablet 800-160 mg    Other Relevant Orders  Urine culture       POCT urinalysis dipstick (Completed)        Return in about 2 weeks (around 04/14/2014).

## 2014-04-02 LAB — URINE CULTURE
Colony Count: NO GROWTH
Organism ID, Bacteria: NO GROWTH

## 2014-04-06 LAB — PROTIME-INR: INR: 2.1 — AB (ref 0.9–1.1)

## 2014-04-11 DIAGNOSIS — R42 Dizziness and giddiness: Secondary | ICD-10-CM | POA: Diagnosis not present

## 2014-04-11 DIAGNOSIS — I10 Essential (primary) hypertension: Secondary | ICD-10-CM | POA: Diagnosis not present

## 2014-04-11 DIAGNOSIS — E782 Mixed hyperlipidemia: Secondary | ICD-10-CM | POA: Diagnosis not present

## 2014-04-11 DIAGNOSIS — R600 Localized edema: Secondary | ICD-10-CM | POA: Diagnosis not present

## 2014-04-13 DIAGNOSIS — I4891 Unspecified atrial fibrillation: Secondary | ICD-10-CM | POA: Diagnosis not present

## 2014-04-13 LAB — PROTIME-INR: INR: 3.1 — AB (ref 0.9–1.1)

## 2014-04-17 ENCOUNTER — Ambulatory Visit (INDEPENDENT_AMBULATORY_CARE_PROVIDER_SITE_OTHER): Payer: Medicare Other | Admitting: Internal Medicine

## 2014-04-17 ENCOUNTER — Encounter (INDEPENDENT_AMBULATORY_CARE_PROVIDER_SITE_OTHER): Payer: Self-pay

## 2014-04-17 ENCOUNTER — Other Ambulatory Visit: Payer: Self-pay | Admitting: Internal Medicine

## 2014-04-17 ENCOUNTER — Encounter: Payer: Self-pay | Admitting: Internal Medicine

## 2014-04-17 VITALS — BP 116/68 | HR 76 | Temp 97.7°F | Ht 61.5 in | Wt 130.5 lb

## 2014-04-17 DIAGNOSIS — K529 Noninfective gastroenteritis and colitis, unspecified: Secondary | ICD-10-CM

## 2014-04-17 DIAGNOSIS — N390 Urinary tract infection, site not specified: Secondary | ICD-10-CM | POA: Diagnosis not present

## 2014-04-17 DIAGNOSIS — I4891 Unspecified atrial fibrillation: Secondary | ICD-10-CM

## 2014-04-17 DIAGNOSIS — R5383 Other fatigue: Secondary | ICD-10-CM

## 2014-04-17 DIAGNOSIS — Z7901 Long term (current) use of anticoagulants: Secondary | ICD-10-CM

## 2014-04-17 MED ORDER — LIDOCAINE 5 % EX PTCH: 1.0000 | MEDICATED_PATCH | CUTANEOUS | Status: DC

## 2014-04-17 NOTE — Progress Notes (Signed)
Subjective:    Patient ID: Kara [name], female    DOB: 1922-09-21, 80 y.o.   MRN: [id number]  HPI 80YO female presents for follow up.  Recently seen for UTI. No current symptoms of dysuria, urgency, frequency. Was not feeling well last week, with general fatigue until about 3 days ago. Now feeling back to normal. Recently seen by her cardiologist. No changes made to medication. She denies any recent chest pain. Occasionally has palpitations, but no recent rapid HR. Continues to have intermittent watery diarrhea. No changes over last few months. Using prn Imodium and Lomotil with some improvement.   No new concerns today.  Review of Systems  Constitutional: Positive for fatigue. Negative for fever, chills, appetite change and unexpected weight change.  Eyes: Negative for visual disturbance.  Respiratory: Negative for cough and shortness of breath.   Cardiovascular: Positive for palpitations. Negative for chest pain and leg swelling.  Gastrointestinal: Positive for diarrhea (chronic). Negative for vomiting, abdominal pain, constipation, blood in stool and rectal pain.  Musculoskeletal: Negative for myalgias and arthralgias.  Skin: Negative for color change and rash.  Hematological: Negative for adenopathy. Does not bruise/bleed easily.  Psychiatric/Behavioral: Negative for sleep disturbance and dysphoric mood. The patient is not nervous/anxious.        Objective:    BP 116/68 mmHg  Pulse 76  Temp(Src) 97.7 F (36.5 C) (Oral)  Ht 5' 1.5" (1.562 m)  Wt 130 lb 8 oz (59.194 kg)  BMI 24.26 kg/m2  SpO2 95% Physical Exam  Constitutional: She is oriented to person, place, and time. She appears well-developed and well-nourished. No distress.  HENT:  Head: Normocephalic and atraumatic.  Right Ear: External ear normal.  Left Ear: External ear normal.  Nose: Nose normal.  Mouth/Throat: Oropharynx is clear and moist. No oropharyngeal exudate.  Eyes: Conjunctivae are normal. Pupils  are equal, round, and reactive to light. Right eye exhibits no discharge. Left eye exhibits no discharge. No scleral icterus.  Neck: Normal range of motion. Neck supple. No tracheal deviation present. No thyromegaly present.  Cardiovascular: Normal rate, normal heart sounds and intact distal pulses.  An irregularly irregular rhythm present. Exam reveals no gallop and no friction rub.   No murmur heard. Pulmonary/Chest: Effort normal and breath sounds normal. No accessory muscle usage. No tachypnea. No respiratory distress. She has no decreased breath sounds. She has no wheezes. She has no rhonchi. She has no rales. She exhibits no tenderness.  Musculoskeletal: Normal range of motion. She exhibits no edema or tenderness.  Lymphadenopathy:    She has no cervical adenopathy.  Neurological: She is alert and oriented to person, place, and time. No cranial nerve deficit. She exhibits normal muscle tone. Coordination normal.  Skin: Skin is warm and dry. No rash noted. She is not diaphoretic. No erythema. No pallor.  Psychiatric: She has a normal mood and affect. Her behavior is normal. Judgment and thought content normal.          Assessment & Plan:   Problem List Items Addressed This Visit      Unprioritized   Atrial fibrillation - Primary    AFIB on Metoprolol and Diltiazem for rate control. Coumadin for anticoagulation. Recent INR well controlled. Will continue to monitor.    Chronic anticoagulation    INR last week 3.1. She will hold 0.5mg  tonight. Recheck INR in 1 week. Uses MDINR.    Chronic diarrhea    Persistent chronic diarrhea. Ongoing for years. Followed by GI. Continue FODMAP diet.  Will continue Imodium and Lomotil prn.    Fatigue    Likely multifactorial. Recent labs were normal. Suspect AFIB playing a roll, however rate well controlled today. Symptoms are improving. Will continue to monitor.    UTI (urinary tract infection)    Symptoms have resolved. Follow up urine culture  was negative. Will monitor for any recurrent symptoms.        Return in about 3 months (around 07/17/2014) for Recheck.

## 2014-04-17 NOTE — Assessment & Plan Note (Signed)
AFIB on Metoprolol and Diltiazem for rate control. Coumadin for anticoagulation. Recent INR well controlled. Will continue to monitor.

## 2014-04-17 NOTE — Assessment & Plan Note (Signed)
Persistent chronic diarrhea. Ongoing for years. Followed by GI. Continue FODMAP diet. Will continue Imodium and Lomotil prn.

## 2014-04-17 NOTE — Progress Notes (Signed)
Pre visit review using our clinic review tool, if applicable. No additional management support is needed unless otherwise documented below in the visit note. 

## 2014-04-17 NOTE — Assessment & Plan Note (Signed)
Symptoms have resolved. Follow up urine culture was negative. Will monitor for any recurrent symptoms.

## 2014-04-17 NOTE — Patient Instructions (Signed)
Follow up in 3 months or sooner as needed. 

## 2014-04-17 NOTE — Assessment & Plan Note (Signed)
Likely multifactorial. Recent labs were normal. Suspect AFIB playing a roll, however rate well controlled today. Symptoms are improving. Will continue to monitor.

## 2014-04-17 NOTE — Assessment & Plan Note (Signed)
INR last week 3.1. She will hold 0.5mg  tonight. Recheck INR in 1 week. Uses MDINR.

## 2014-04-19 ENCOUNTER — Ambulatory Visit: Payer: Medicare Other | Admitting: Internal Medicine

## 2014-04-20 LAB — PROTIME-INR

## 2014-04-21 ENCOUNTER — Encounter: Payer: Self-pay | Admitting: Internal Medicine

## 2014-04-25 ENCOUNTER — Encounter: Payer: Self-pay | Admitting: Internal Medicine

## 2014-04-27 ENCOUNTER — Encounter: Payer: Self-pay | Admitting: Internal Medicine

## 2014-04-27 DIAGNOSIS — I495 Sick sinus syndrome: Secondary | ICD-10-CM | POA: Diagnosis not present

## 2014-04-27 DIAGNOSIS — I10 Essential (primary) hypertension: Secondary | ICD-10-CM | POA: Diagnosis not present

## 2014-04-27 DIAGNOSIS — E782 Mixed hyperlipidemia: Secondary | ICD-10-CM | POA: Diagnosis not present

## 2014-04-27 DIAGNOSIS — R0602 Shortness of breath: Secondary | ICD-10-CM | POA: Diagnosis not present

## 2014-04-27 DIAGNOSIS — I4891 Unspecified atrial fibrillation: Secondary | ICD-10-CM | POA: Diagnosis not present

## 2014-04-28 LAB — PROTIME-INR

## 2014-05-04 ENCOUNTER — Encounter: Payer: Self-pay | Admitting: Internal Medicine

## 2014-05-04 LAB — POCT INR: INR: 2.1 — AB (ref 0.9–1.1)

## 2014-05-08 ENCOUNTER — Telehealth: Payer: Self-pay | Admitting: Internal Medicine

## 2014-05-08 ENCOUNTER — Other Ambulatory Visit: Payer: Self-pay | Admitting: *Deleted

## 2014-05-08 ENCOUNTER — Other Ambulatory Visit (INDEPENDENT_AMBULATORY_CARE_PROVIDER_SITE_OTHER): Payer: Medicare Other

## 2014-05-08 DIAGNOSIS — R319 Hematuria, unspecified: Secondary | ICD-10-CM | POA: Diagnosis not present

## 2014-05-08 DIAGNOSIS — N390 Urinary tract infection, site not specified: Secondary | ICD-10-CM | POA: Diagnosis not present

## 2014-05-08 LAB — POCT URINALYSIS DIPSTICK
Bilirubin, UA: NEGATIVE
Glucose, UA: NEGATIVE
Ketones, UA: NEGATIVE
Nitrite, UA: NEGATIVE
Protein, UA: NEGATIVE
Spec Grav, UA: <=1.005
Urobilinogen, UA: 0.2
pH, UA: 6

## 2014-05-08 MED ORDER — SULFAMETHOXAZOLE-TRIMETHOPRIM 800-160 MG PO TABS: 1.0000 | ORAL_TABLET | Freq: Two times a day (BID) | ORAL | Status: DC

## 2014-05-08 NOTE — Telephone Encounter (Signed)
Ms. Blackston called saying she thinks she has a UTI. She feels like she has to go to the bathroom often with little coming out. Also, there's itching and discomfort. She also mentioned she doesn't "feel right" and isn't sure if that's due to her heart or not. She has shortness of breath and is feeling tired. She's wondering if she can be seen mainly for the UTI because she had one recently. Please call the pt. Pt home ph# [phone]  /  Pt daughter's ph# [phone] Thank you.

## 2014-05-08 NOTE — Telephone Encounter (Signed)
Can we have her come in today to give a urine for urinalysis and culture? Then, can we add her at the 3pm slot tomorrow?

## 2014-05-08 NOTE — Telephone Encounter (Signed)
Spoke to patient, states started with urinary urgency and dysuria yesterday, feels like previous UTI. Also complaining of itching after urination. Advised I would send a message to Dr. Gilford Rile as no appointments currently available. Please advise what you want me to tell her  Also discussed patient's SOB, denies chest pain. Increased fatigue but also states she is up walking a lot more the last week with holidays and family visiting. Had spoken to Dr. Nehemiah Massed last week about SOB and fatigue, she is scheduled for pacemaker replacement next week. PT states SOB and fatigue unchanged from last week.

## 2014-05-08 NOTE — Telephone Encounter (Signed)
Spoke with pt, scheduled appt and  verbalized understanding

## 2014-05-09 ENCOUNTER — Encounter: Payer: Self-pay | Admitting: Internal Medicine

## 2014-05-09 ENCOUNTER — Ambulatory Visit (INDEPENDENT_AMBULATORY_CARE_PROVIDER_SITE_OTHER): Payer: Medicare Other | Admitting: Internal Medicine

## 2014-05-09 VITALS — BP 130/80 | HR 99 | Temp 98.1°F | Ht 61.5 in | Wt 134.5 lb

## 2014-05-09 DIAGNOSIS — N390 Urinary tract infection, site not specified: Secondary | ICD-10-CM | POA: Diagnosis not present

## 2014-05-09 DIAGNOSIS — I340 Nonrheumatic mitral (valve) insufficiency: Secondary | ICD-10-CM | POA: Diagnosis not present

## 2014-05-09 DIAGNOSIS — I4891 Unspecified atrial fibrillation: Secondary | ICD-10-CM

## 2014-05-09 LAB — POCT URINALYSIS DIPSTICK
Bilirubin, UA: NEGATIVE
Glucose, UA: NEGATIVE
Ketones, UA: NEGATIVE
Nitrite, UA: NEGATIVE
Protein, UA: NEGATIVE
Spec Grav, UA: <=1.005
Urobilinogen, UA: 0.2
pH, UA: 7

## 2014-05-09 NOTE — Progress Notes (Signed)
Subjective:    Patient ID: Kara [name], female    DOB: 1923/04/22, 80 y.o.   MRN: [id number]  HPI 80YO female presents for acute visit.  Dysuria - Called clinic yesterday with complaint of dysuria. Urinalysis showed blood and leukocytes. She was started on Bactrim. Has taken two doses. Symptoms of dysuria have improved. No frequency or urgency, as before.  AFIB - planning to have pacer replaced Jan. Continues to have dyspnea with exertion and some fatigue. She is concerned this is related to afib. She questions if she should talk again with her cardiologist about other options for management.   Past medical, surgical, family and social history per today's encounter.  Review of Systems  Constitutional: Negative for fever, chills, appetite change, fatigue and unexpected weight change.  Eyes: Negative for visual disturbance.  Respiratory: Positive for shortness of breath (with exertion).   Cardiovascular: Negative for chest pain, palpitations and leg swelling.  Gastrointestinal: Positive for diarrhea (chronic). Negative for abdominal pain and constipation.  Genitourinary: Negative for dysuria, urgency, frequency, hematuria, flank pain, difficulty urinating and pelvic pain.  Skin: Negative for color change and rash.  Hematological: Negative for adenopathy. Does not bruise/bleed easily.  Psychiatric/Behavioral: Negative for dysphoric mood. The patient is not nervous/anxious.        Objective:    BP 130/80 mmHg  Pulse 99  Temp(Src) 98.1 F (36.7 C) (Oral)  Ht 5' 1.5" (1.562 m)  Wt 134 lb 8 oz (61.009 kg)  BMI 25.01 kg/m2  SpO2 95% Physical Exam  Constitutional: She is oriented to person, place, and time. She appears well-developed and well-nourished. No distress.  HENT:  Head: Normocephalic and atraumatic.  Right Ear: External ear normal.  Left Ear: External ear normal.  Nose: Nose normal.  Mouth/Throat: Oropharynx is clear and moist. No oropharyngeal exudate.  Eyes:  Conjunctivae are normal. Pupils are equal, round, and reactive to light. Right eye exhibits no discharge. Left eye exhibits no discharge. No scleral icterus.  Neck: Normal range of motion. Neck supple. No tracheal deviation present. No thyromegaly present.  Cardiovascular: Normal rate, normal heart sounds and intact distal pulses.  An irregularly irregular rhythm present. Exam reveals no gallop and no friction rub.   No murmur heard. Pulmonary/Chest: Effort normal and breath sounds normal. No accessory muscle usage. No tachypnea. No respiratory distress. She has no decreased breath sounds. She has no wheezes. She has no rhonchi. She has no rales. She exhibits no tenderness.  Musculoskeletal: Normal range of motion. She exhibits no edema or tenderness.  Lymphadenopathy:    She has no cervical adenopathy.  Neurological: She is alert and oriented to person, place, and time. No cranial nerve deficit. She exhibits normal muscle tone. Coordination normal.  Skin: Skin is warm and dry. No rash noted. She is not diaphoretic. No erythema. No pallor.  Psychiatric: She has a normal mood and affect. Her behavior is normal. Judgment and thought content normal.          Assessment & Plan:   Problem List Items Addressed This Visit      Unprioritized   Atrial fibrillation    She is concerned about being in atrial fibrillation for prolonged periods. We discussed rate versus rhythm control and potential risks of trying to get her back into sinus rhythm. I recommended that she set up an earlier visit with her cardiologist to discuss this and referral to specialist in atrial fibrillation if she would like.    Relevant Orders  Urine culture   Mitral valve regurgitation    Reviewed ECHO and notes from Cardiology. Recommended follow up with cardiology as scheduled. We discussed that symptoms of dyspnea on exertion may be related to MVR.    UTI (urinary tract infection) - Primary    Symptoms of UTI have  improved. Will wait on culture results and continue Bactrim. Follow up prn. She will continue to monitor INR weekly.    Relevant Orders      POCT urinalysis dipstick (Completed)      Urine culture       Return in about 4 weeks (around 06/06/2014) for Recheck.

## 2014-05-09 NOTE — Assessment & Plan Note (Addendum)
Symptoms of UTI have improved. Will wait on culture results and continue Bactrim. Follow up prn. She will continue to monitor INR weekly.

## 2014-05-09 NOTE — Assessment & Plan Note (Signed)
She is concerned about being in atrial fibrillation for prolonged periods. We discussed rate versus rhythm control and potential risks of trying to get her back into sinus rhythm. I recommended that she set up an earlier visit with her cardiologist to discuss this and referral to specialist in atrial fibrillation if she would like.

## 2014-05-09 NOTE — Progress Notes (Signed)
Pre visit review using our clinic review tool, if applicable. No additional management support is needed unless otherwise documented below in the visit note. 

## 2014-05-09 NOTE — Patient Instructions (Signed)
Follow up after pacemaker replacement.  Consider referral to Dr. Thompson Grayer.

## 2014-05-09 NOTE — Assessment & Plan Note (Signed)
Reviewed ECHO and notes from Cardiology. Recommended follow up with cardiology as scheduled. We discussed that symptoms of dyspnea on exertion may be related to MVR.

## 2014-05-10 DIAGNOSIS — I4891 Unspecified atrial fibrillation: Secondary | ICD-10-CM | POA: Diagnosis not present

## 2014-05-10 LAB — URINE CULTURE
Colony Count: NO GROWTH
Organism ID, Bacteria: NO GROWTH

## 2014-05-10 LAB — PROTIME-INR

## 2014-05-11 ENCOUNTER — Encounter: Payer: Self-pay | Admitting: Internal Medicine

## 2014-05-11 ENCOUNTER — Telehealth: Payer: Self-pay | Admitting: Internal Medicine

## 2014-05-11 LAB — URINE CULTURE: Colony Count: >=100000

## 2014-05-11 NOTE — Telephone Encounter (Signed)
INR yesterday was elevated 3.4. I would recommend holding coumadin tonight, then restarting same dose. Recheck INR next week.

## 2014-05-11 NOTE — Telephone Encounter (Signed)
Notified pt. 

## 2014-05-13 ENCOUNTER — Other Ambulatory Visit: Payer: Self-pay | Admitting: Internal Medicine

## 2014-05-15 NOTE — Telephone Encounter (Signed)
Ok refill? 

## 2014-05-17 LAB — PROTIME-INR

## 2014-05-22 ENCOUNTER — Ambulatory Visit: Payer: Self-pay | Admitting: Cardiology

## 2014-05-22 DIAGNOSIS — Z01810 Encounter for preprocedural cardiovascular examination: Secondary | ICD-10-CM | POA: Diagnosis not present

## 2014-05-22 DIAGNOSIS — Z950 Presence of cardiac pacemaker: Secondary | ICD-10-CM | POA: Diagnosis not present

## 2014-05-22 DIAGNOSIS — Z01812 Encounter for preprocedural laboratory examination: Secondary | ICD-10-CM | POA: Diagnosis not present

## 2014-05-22 DIAGNOSIS — Z87891 Personal history of nicotine dependence: Secondary | ICD-10-CM | POA: Diagnosis not present

## 2014-05-22 DIAGNOSIS — R0602 Shortness of breath: Secondary | ICD-10-CM | POA: Diagnosis not present

## 2014-05-22 DIAGNOSIS — J449 Chronic obstructive pulmonary disease, unspecified: Secondary | ICD-10-CM | POA: Diagnosis not present

## 2014-05-22 DIAGNOSIS — Z01818 Encounter for other preprocedural examination: Secondary | ICD-10-CM | POA: Diagnosis not present

## 2014-05-22 DIAGNOSIS — R5383 Other fatigue: Secondary | ICD-10-CM | POA: Diagnosis not present

## 2014-05-22 LAB — CBC WITH DIFFERENTIAL/PLATELET
Basophil #: 0 10*3/uL (ref 0.0–0.1)
Basophil %: 0.3 %
Eosinophil #: 0.1 10*3/uL (ref 0.0–0.7)
Eosinophil %: 0.8 %
HCT: 41.1 % (ref 35.0–47.0)
HGB: 13.7 g/dL (ref 12.0–16.0)
Lymphocyte #: 2.6 10*3/uL (ref 1.0–3.6)
Lymphocyte %: 36.2 %
MCH: 30.8 pg (ref 26.0–34.0)
MCHC: 33.5 g/dL (ref 32.0–36.0)
MCV: 92 fL (ref 80–100)
Monocyte #: 0.4 x10 3/mm (ref 0.2–0.9)
Monocyte %: 5.9 %
Neutrophil #: 4 10*3/uL (ref 1.4–6.5)
Neutrophil %: 56.8 %
Platelet: 193 10*3/uL (ref 150–440)
RBC: 4.46 10*6/uL (ref 3.80–5.20)
RDW: 13.7 % (ref 11.5–14.5)
WBC: 7.1 10*3/uL (ref 3.6–11.0)

## 2014-05-22 LAB — BASIC METABOLIC PANEL
Anion Gap: 9 (ref 7–16)
BUN: 18 mg/dL (ref 7–18)
Calcium, Total: 8.7 mg/dL (ref 8.5–10.1)
Chloride: 105 mmol/L (ref 98–107)
Co2: 28 mmol/L (ref 21–32)
Creatinine: 0.99 mg/dL (ref 0.60–1.30)
EGFR (African American): >60
EGFR (Non-African Amer.): 56 — ABNORMAL LOW
Glucose: 135 mg/dL — ABNORMAL HIGH (ref 65–99)
Osmolality: 287 (ref 275–301)
Potassium: 3.2 mmol/L — ABNORMAL LOW (ref 3.5–5.1)
Sodium: 142 mmol/L (ref 136–145)

## 2014-05-22 LAB — PROTIME-INR
INR: 1.6
Prothrombin Time: 18.7 secs — ABNORMAL HIGH (ref 11.5–14.7)

## 2014-05-22 LAB — APTT: Activated PTT: 33.1 secs (ref 23.6–35.9)

## 2014-05-24 DIAGNOSIS — E782 Mixed hyperlipidemia: Secondary | ICD-10-CM | POA: Diagnosis not present

## 2014-05-24 DIAGNOSIS — I2510 Atherosclerotic heart disease of native coronary artery without angina pectoris: Secondary | ICD-10-CM | POA: Diagnosis not present

## 2014-05-24 DIAGNOSIS — I10 Essential (primary) hypertension: Secondary | ICD-10-CM | POA: Diagnosis not present

## 2014-05-24 DIAGNOSIS — I480 Paroxysmal atrial fibrillation: Secondary | ICD-10-CM | POA: Diagnosis not present

## 2014-05-26 LAB — POCT INR: INR: 2.2 — AB (ref <=1.1)

## 2014-05-30 ENCOUNTER — Ambulatory Visit: Payer: Self-pay | Admitting: Cardiology

## 2014-05-30 DIAGNOSIS — I2510 Atherosclerotic heart disease of native coronary artery without angina pectoris: Secondary | ICD-10-CM | POA: Diagnosis not present

## 2014-05-30 DIAGNOSIS — E785 Hyperlipidemia, unspecified: Secondary | ICD-10-CM | POA: Diagnosis not present

## 2014-05-30 DIAGNOSIS — J449 Chronic obstructive pulmonary disease, unspecified: Secondary | ICD-10-CM | POA: Diagnosis not present

## 2014-05-30 DIAGNOSIS — I10 Essential (primary) hypertension: Secondary | ICD-10-CM | POA: Diagnosis not present

## 2014-05-30 DIAGNOSIS — Z7901 Long term (current) use of anticoagulants: Secondary | ICD-10-CM | POA: Diagnosis not present

## 2014-05-30 DIAGNOSIS — I4891 Unspecified atrial fibrillation: Secondary | ICD-10-CM | POA: Diagnosis not present

## 2014-05-30 DIAGNOSIS — G4730 Sleep apnea, unspecified: Secondary | ICD-10-CM | POA: Diagnosis not present

## 2014-05-30 DIAGNOSIS — Z8489 Family history of other specified conditions: Secondary | ICD-10-CM | POA: Diagnosis not present

## 2014-05-30 DIAGNOSIS — Z888 Allergy status to other drugs, medicaments and biological substances status: Secondary | ICD-10-CM | POA: Diagnosis not present

## 2014-05-30 DIAGNOSIS — Z8261 Family history of arthritis: Secondary | ICD-10-CM | POA: Diagnosis not present

## 2014-05-30 DIAGNOSIS — Z79899 Other long term (current) drug therapy: Secondary | ICD-10-CM | POA: Diagnosis not present

## 2014-05-30 DIAGNOSIS — I482 Chronic atrial fibrillation: Secondary | ICD-10-CM | POA: Diagnosis not present

## 2014-05-30 DIAGNOSIS — Z809 Family history of malignant neoplasm, unspecified: Secondary | ICD-10-CM | POA: Diagnosis not present

## 2014-05-30 DIAGNOSIS — E119 Type 2 diabetes mellitus without complications: Secondary | ICD-10-CM | POA: Diagnosis not present

## 2014-05-30 DIAGNOSIS — Z45010 Encounter for checking and testing of cardiac pacemaker pulse generator [battery]: Secondary | ICD-10-CM | POA: Diagnosis not present

## 2014-05-30 DIAGNOSIS — Z8542 Personal history of malignant neoplasm of other parts of uterus: Secondary | ICD-10-CM | POA: Diagnosis not present

## 2014-05-30 DIAGNOSIS — H409 Unspecified glaucoma: Secondary | ICD-10-CM | POA: Diagnosis not present

## 2014-05-30 DIAGNOSIS — I495 Sick sinus syndrome: Secondary | ICD-10-CM | POA: Diagnosis not present

## 2014-05-30 DIAGNOSIS — M1990 Unspecified osteoarthritis, unspecified site: Secondary | ICD-10-CM | POA: Diagnosis not present

## 2014-05-30 DIAGNOSIS — Z881 Allergy status to other antibiotic agents status: Secondary | ICD-10-CM | POA: Diagnosis not present

## 2014-05-30 DIAGNOSIS — Z87891 Personal history of nicotine dependence: Secondary | ICD-10-CM | POA: Diagnosis not present

## 2014-05-30 DIAGNOSIS — R42 Dizziness and giddiness: Secondary | ICD-10-CM | POA: Diagnosis not present

## 2014-05-30 LAB — PROTIME-INR
INR: 1.6
Prothrombin Time: 18.6 secs — ABNORMAL HIGH (ref 11.5–14.7)

## 2014-06-01 ENCOUNTER — Encounter: Payer: Self-pay | Admitting: Internal Medicine

## 2014-06-01 DIAGNOSIS — D1801 Hemangioma of skin and subcutaneous tissue: Secondary | ICD-10-CM | POA: Diagnosis not present

## 2014-06-01 DIAGNOSIS — L821 Other seborrheic keratosis: Secondary | ICD-10-CM | POA: Diagnosis not present

## 2014-06-01 DIAGNOSIS — Z85828 Personal history of other malignant neoplasm of skin: Secondary | ICD-10-CM | POA: Diagnosis not present

## 2014-06-01 LAB — PROTIME-INR

## 2014-06-02 ENCOUNTER — Telehealth: Payer: Self-pay | Admitting: *Deleted

## 2014-06-02 NOTE — Telephone Encounter (Signed)
OK. This is much lower than recent results. Can you call her to confirm that she has not missed any doses of coumadin?

## 2014-06-02 NOTE — Telephone Encounter (Signed)
Pt notified and verbalized understanding.

## 2014-06-02 NOTE — Telephone Encounter (Signed)
Called and advised patient of results. Pt had battery replaced in pacemaker on Tuesday 05/30/14. No Coumadin Sunday, Monday, or Tuesday. Took Coumadin Wednesday, and yesterday (but took it after she checked PT/INR). Has resumed with previous regimen: 1 mg M-W-F, 1.5 mg other 4 days.

## 2014-06-02 NOTE — Telephone Encounter (Signed)
Courtney from [facility] called   INR: 1.4  From yesterday (01.14.2016)

## 2014-06-02 NOTE — Telephone Encounter (Signed)
OK. We will just follow up next week with repeat INR.

## 2014-06-07 DIAGNOSIS — I480 Paroxysmal atrial fibrillation: Secondary | ICD-10-CM | POA: Diagnosis not present

## 2014-06-07 DIAGNOSIS — I495 Sick sinus syndrome: Secondary | ICD-10-CM | POA: Diagnosis not present

## 2014-06-07 DIAGNOSIS — I10 Essential (primary) hypertension: Secondary | ICD-10-CM | POA: Diagnosis not present

## 2014-06-07 DIAGNOSIS — I2510 Atherosclerotic heart disease of native coronary artery without angina pectoris: Secondary | ICD-10-CM | POA: Diagnosis not present

## 2014-06-08 ENCOUNTER — Encounter: Payer: Self-pay | Admitting: Internal Medicine

## 2014-06-09 DIAGNOSIS — I4891 Unspecified atrial fibrillation: Secondary | ICD-10-CM | POA: Diagnosis not present

## 2014-06-09 LAB — POCT INR: INR: 1.7 — AB (ref <=1.1)

## 2014-06-13 ENCOUNTER — Ambulatory Visit (INDEPENDENT_AMBULATORY_CARE_PROVIDER_SITE_OTHER): Payer: Medicare Other | Admitting: Internal Medicine

## 2014-06-13 ENCOUNTER — Encounter: Payer: Self-pay | Admitting: Internal Medicine

## 2014-06-13 VITALS — BP 134/71 | HR 91 | Temp 98.3°F | Ht 61.5 in | Wt 130.5 lb

## 2014-06-13 DIAGNOSIS — J069 Acute upper respiratory infection, unspecified: Secondary | ICD-10-CM

## 2014-06-13 DIAGNOSIS — N3000 Acute cystitis without hematuria: Secondary | ICD-10-CM

## 2014-06-13 DIAGNOSIS — R300 Dysuria: Secondary | ICD-10-CM

## 2014-06-13 DIAGNOSIS — B9789 Other viral agents as the cause of diseases classified elsewhere: Secondary | ICD-10-CM

## 2014-06-13 LAB — POCT URINALYSIS DIPSTICK
Bilirubin, UA: NEGATIVE
Glucose, UA: NEGATIVE
Ketones, UA: NEGATIVE
Nitrite, UA: NEGATIVE
Protein, UA: NEGATIVE
Spec Grav, UA: <=1.005
Urobilinogen, UA: 0.2
pH, UA: 6

## 2014-06-13 MED ORDER — HYDROCOD POLST-CHLORPHEN POLST 10-8 MG/5ML PO LQCR: 5.0000 mL | Freq: Two times a day (BID) | ORAL | Status: DC | PRN

## 2014-06-13 MED ORDER — CIPROFLOXACIN HCL 500 MG PO TABS: 500.0000 mg | ORAL_TABLET | Freq: Two times a day (BID) | ORAL | Status: DC

## 2014-06-13 NOTE — Assessment & Plan Note (Signed)
Symptoms and UA consistent with UTI. Will send urine for culture. Based on previous positive culture results from 04/2014, will start Cipro. Follow up if symptoms are not improving.

## 2014-06-13 NOTE — Assessment & Plan Note (Signed)
Symptoms and exam most c/w viral URI with cough. Encouraged rest, adequate fluids. Will start prn Tussionex for cough at night. Follow up if symptoms are not improving over next week.

## 2014-06-13 NOTE — Progress Notes (Signed)
Subjective:    Patient ID: Kara [name], female    DOB: May 09, 1923, 81 y.o.   MRN: [id number]  HPI 81YO female presents for follow up.  Woke up Saturday with severe sore throat. Has some cough today. No headache, nausea, fever. No dyspnea.  Has some burning and itching with urination. Has some urinary urgency and frequency. Lower back aching at times, but no flank pain. Questions if she may have recurrent UTI.    Past medical, surgical, family and social history per today's encounter.  Review of Systems  Constitutional: Positive for fatigue. Negative for fever, chills and unexpected weight change.  HENT: Positive for congestion, postnasal drip, rhinorrhea and sore throat. Negative for ear discharge, ear pain, facial swelling, hearing loss, mouth sores, nosebleeds, sinus pressure, sneezing, tinnitus, trouble swallowing and voice change.   Eyes: Negative for pain, discharge, redness and visual disturbance.  Respiratory: Positive for cough. Negative for chest tightness, shortness of breath, wheezing and stridor.   Cardiovascular: Negative for chest pain, palpitations and leg swelling.  Gastrointestinal: Negative for nausea, vomiting, diarrhea and constipation.  Genitourinary: Positive for dysuria, urgency and frequency.  Musculoskeletal: Negative for myalgias, arthralgias, neck pain and neck stiffness.  Skin: Negative for color change and rash.  Neurological: Negative for dizziness, weakness, light-headedness and headaches.  Hematological: Negative for adenopathy.       Objective:    BP 134/71 mmHg  Pulse 91  Temp(Src) 98.3 F (36.8 C) (Oral)  Ht 5' 1.5" (1.562 m)  Wt 130 lb 8 oz (59.194 kg)  BMI 24.26 kg/m2  SpO2 96% Physical Exam  Constitutional: She is oriented to person, place, and time. She appears well-developed and well-nourished. No distress.  HENT:  Head: Normocephalic and atraumatic.  Right Ear: External ear normal.  Left Ear: External ear normal.  Nose: Nose  normal.  Mouth/Throat: Posterior oropharyngeal erythema (mild) present. No oropharyngeal exudate.  Eyes: Conjunctivae are normal. Pupils are equal, round, and reactive to light. Right eye exhibits no discharge. Left eye exhibits no discharge. No scleral icterus.  Neck: Normal range of motion. Neck supple. No tracheal deviation present. No thyromegaly present.  Cardiovascular: Normal rate, regular rhythm, normal heart sounds and intact distal pulses.  Exam reveals no gallop and no friction rub.   No murmur heard. Pulmonary/Chest: Effort normal and breath sounds normal. No accessory muscle usage. No tachypnea. No respiratory distress. She has no decreased breath sounds. She has no wheezes. She has no rhonchi. She has no rales. She exhibits no tenderness.  Abdominal: There is no tenderness (no CVA tenderness).  Musculoskeletal: Normal range of motion. She exhibits no edema or tenderness.  Lymphadenopathy:    She has no cervical adenopathy.  Neurological: She is alert and oriented to person, place, and time. No cranial nerve deficit. She exhibits normal muscle tone. Coordination normal.  Skin: Skin is warm and dry. No rash noted. She is not diaphoretic. No erythema. No pallor.  Psychiatric: She has a normal mood and affect. Her behavior is normal. Judgment and thought content normal.          Assessment & Plan:   Problem List Items Addressed This Visit      Unprioritized   UTI (urinary tract infection)    Symptoms and UA consistent with UTI. Will send urine for culture. Based on previous positive culture results from 04/2014, will start Cipro. Follow up if symptoms are not improving.      Viral URI with cough    Symptoms and exam  most c/w viral URI with cough. Encouraged rest, adequate fluids. Will start prn Tussionex for cough at night. Follow up if symptoms are not improving over next week.       Other Visit Diagnoses    Dysuria    -  Primary    Relevant Orders    POCT Urinalysis  Dipstick (Completed)    CULTURE, URINE COMPREHENSIVE        Return in about 4 weeks (around 07/11/2014) for Recheck.

## 2014-06-13 NOTE — Patient Instructions (Addendum)
Please start Cipro 500mg  twice daily for urinary tract infection. We will send a culture and call you with results.  Upper Respiratory Infection, Adult An upper respiratory infection (URI) is also sometimes known as the common cold. The upper respiratory tract includes the nose, sinuses, throat, trachea, and bronchi. Bronchi are the airways leading to the lungs. Most people improve within 1 week, but symptoms can last up to 2 weeks. A residual cough may last even longer.  CAUSES Many different viruses can infect the tissues lining the upper respiratory tract. The tissues become irritated and inflamed and often become very moist. Mucus production is also common. A cold is contagious. You can easily spread the virus to others by oral contact. This includes kissing, sharing a glass, coughing, or sneezing. Touching your mouth or nose and then touching a surface, which is then touched by another person, can also spread the virus. SYMPTOMS  Symptoms typically develop 1 to 3 days after you come in contact with a cold virus. Symptoms vary from person to person. They may include:  Runny nose.  Sneezing.  Nasal congestion.  Sinus irritation.  Sore throat.  Loss of voice (laryngitis).  Cough.  Fatigue.  Muscle aches.  Loss of appetite.  Headache.  Low-grade fever. DIAGNOSIS  You might diagnose your own cold based on familiar symptoms, since most people get a cold 2 to 3 times a year. Your caregiver can confirm this based on your exam. Most importantly, your caregiver can check that your symptoms are not due to another disease such as strep throat, sinusitis, pneumonia, asthma, or epiglottitis. Blood tests, throat tests, and X-rays are not necessary to diagnose a common cold, but they may sometimes be helpful in excluding other more serious diseases. Your caregiver will decide if any further tests are required. RISKS AND COMPLICATIONS  You may be at risk for a more severe case of the common  cold if you smoke cigarettes, have chronic heart disease (such as heart failure) or lung disease (such as asthma), or if you have a weakened immune system. The very young and very old are also at risk for more serious infections. Bacterial sinusitis, middle ear infections, and bacterial pneumonia can complicate the common cold. The common cold can worsen asthma and chronic obstructive pulmonary disease (COPD). Sometimes, these complications can require emergency medical care and may be life-threatening. PREVENTION  The best way to protect against getting a cold is to practice good hygiene. Avoid oral or hand contact with people with cold symptoms. Wash your hands often if contact occurs. There is no clear evidence that vitamin C, vitamin E, echinacea, or exercise reduces the chance of developing a cold. However, it is always recommended to get plenty of rest and practice good nutrition. TREATMENT  Treatment is directed at relieving symptoms. There is no cure. Antibiotics are not effective, because the infection is caused by a virus, not by bacteria. Treatment may include:  Increased fluid intake. Sports drinks offer valuable electrolytes, sugars, and fluids.  Breathing heated mist or steam (vaporizer or shower).  Eating chicken soup or other clear broths, and maintaining good nutrition.  Getting plenty of rest.  Using gargles or lozenges for comfort.  Controlling fevers with ibuprofen or acetaminophen as directed by your caregiver.  Increasing usage of your inhaler if you have asthma. Zinc gel and zinc lozenges, taken in the first 24 hours of the common cold, can shorten the duration and lessen the severity of symptoms. Pain medicines may help with  fever, muscle aches, and throat pain. A variety of non-prescription medicines are available to treat congestion and runny nose. Your caregiver can make recommendations and may suggest nasal or lung inhalers for other symptoms.  HOME CARE INSTRUCTIONS     Only take over-the-counter or prescription medicines for pain, discomfort, or fever as directed by your caregiver.  Use a warm mist humidifier or inhale steam from a shower to increase air moisture. This may keep secretions moist and make it easier to breathe.  Drink enough water and fluids to keep your urine clear or pale yellow.  Rest as needed.  Return to work when your temperature has returned to normal or as your caregiver advises. You may need to stay home longer to avoid infecting others. You can also use a face mask and careful hand washing to prevent spread of the virus. SEEK MEDICAL CARE IF:   After the first few days, you feel you are getting worse rather than better.  You need your caregiver's advice about medicines to control symptoms.  You develop chills, worsening shortness of breath, or brown or red sputum. These may be signs of pneumonia.  You develop yellow or brown nasal discharge or pain in the face, especially when you bend forward. These may be signs of sinusitis.  You develop a fever, swollen neck glands, pain with swallowing, or white areas in the back of your throat. These may be signs of strep throat. SEEK IMMEDIATE MEDICAL CARE IF:   You have a fever.  You develop severe or persistent headache, ear pain, sinus pain, or chest pain.  You develop wheezing, a prolonged cough, cough up blood, or have a change in your usual mucus (if you have chronic lung disease).  You develop sore muscles or a stiff neck. Document Released: 10/29/2000 Document Revised: 07/28/2011 Document Reviewed: 08/10/2013 [facility] Patient Information 2015 [place], [place]. This information is not intended to replace advice given to you by your health care provider. Make sure you discuss any questions you have with your health care provider.

## 2014-06-13 NOTE — Progress Notes (Signed)
Pre visit review using our clinic review tool, if applicable. No additional management support is needed unless otherwise documented below in the visit note. 

## 2014-06-14 ENCOUNTER — Telehealth: Payer: Self-pay

## 2014-06-14 NOTE — Telephone Encounter (Signed)
The patient called and wanted to report that her medication is making her excessively tired.  She is hoping a different medication can be called in.

## 2014-06-14 NOTE — Telephone Encounter (Signed)
What medication

## 2014-06-15 ENCOUNTER — Other Ambulatory Visit: Payer: Self-pay | Admitting: *Deleted

## 2014-06-15 LAB — POCT INR: INR: 2.1 — AB (ref 0.9–1.1)

## 2014-06-15 MED ORDER — BENZONATATE 200 MG PO CAPS: 200.0000 mg | ORAL_CAPSULE | Freq: Two times a day (BID) | ORAL | Status: DC | PRN

## 2014-06-15 NOTE — Telephone Encounter (Signed)
Spoke with Ronna Polio, RN advised of MDs message.  States she will emphasize to pt.

## 2014-06-15 NOTE — Telephone Encounter (Signed)
Tussionex can make her tired.

## 2014-06-15 NOTE — Telephone Encounter (Signed)
Spoke with pt she states the medications are Tussionex and Cipro.

## 2014-06-16 ENCOUNTER — Other Ambulatory Visit: Payer: Self-pay | Admitting: *Deleted

## 2014-06-16 LAB — CULTURE, URINE COMPREHENSIVE: Colony Count: >=100000

## 2014-06-16 MED ORDER — CEPHALEXIN 500 MG PO CAPS: 500.0000 mg | ORAL_CAPSULE | Freq: Two times a day (BID) | ORAL | Status: DC

## 2014-06-21 ENCOUNTER — Encounter: Payer: Self-pay | Admitting: Internal Medicine

## 2014-06-23 ENCOUNTER — Telehealth: Payer: Self-pay | Admitting: *Deleted

## 2014-06-23 ENCOUNTER — Telehealth: Payer: Self-pay | Admitting: Internal Medicine

## 2014-06-23 LAB — POCT INR: INR: 1.7 — AB (ref 0.9–1.1)

## 2014-06-23 MED ORDER — FLUCONAZOLE 150 MG PO TABS: 150.0000 mg | ORAL_TABLET | Freq: Once | ORAL | Status: DC

## 2014-06-23 NOTE — Telephone Encounter (Signed)
OK. We can just plan to recheck next week with [facility]. Her levels have fluctuated some and she is starting Diflucan today. (whenever you reach her)

## 2014-06-23 NOTE — Telephone Encounter (Signed)
Line busy, will try back later. 

## 2014-06-23 NOTE — Telephone Encounter (Signed)
The patient has been on a lot of antibiotics and she thinks she has a yeast infection. She is wanting an antibiotic called to the pharmacy.

## 2014-06-23 NOTE — Telephone Encounter (Signed)
MD/[facility] left VM. INR 1.7. Attempted to reach pt about missing doses or current Coumadin regimen, busy signal.

## 2014-06-23 NOTE — Telephone Encounter (Signed)
Fine to call in Diflucan 150mg  po x1

## 2014-06-23 NOTE — Telephone Encounter (Signed)
Line continues to be busy. 

## 2014-06-23 NOTE — Telephone Encounter (Signed)
Pt notified. Rx sent to pharmacy by escript

## 2014-06-26 NOTE — Telephone Encounter (Signed)
Pt notified and  verbalized understanding. Does not think she has missed any doses. Will recheck INR this week

## 2014-06-29 LAB — POCT INR: INR: 3 — AB (ref 0.9–1.1)

## 2014-07-07 DIAGNOSIS — I4891 Unspecified atrial fibrillation: Secondary | ICD-10-CM | POA: Diagnosis not present

## 2014-07-13 ENCOUNTER — Ambulatory Visit (INDEPENDENT_AMBULATORY_CARE_PROVIDER_SITE_OTHER): Payer: Medicare Other | Admitting: Internal Medicine

## 2014-07-13 ENCOUNTER — Encounter: Payer: Self-pay | Admitting: Internal Medicine

## 2014-07-13 VITALS — BP 117/72 | HR 78 | Temp 97.8°F | Ht 61.5 in | Wt 129.0 lb

## 2014-07-13 DIAGNOSIS — I4891 Unspecified atrial fibrillation: Secondary | ICD-10-CM | POA: Diagnosis not present

## 2014-07-13 DIAGNOSIS — R5382 Chronic fatigue, unspecified: Secondary | ICD-10-CM | POA: Diagnosis not present

## 2014-07-13 DIAGNOSIS — Z7901 Long term (current) use of anticoagulants: Secondary | ICD-10-CM | POA: Diagnosis not present

## 2014-07-13 LAB — COMPREHENSIVE METABOLIC PANEL
ALT: 14 U/L (ref 0–35)
AST: 19 U/L (ref 0–37)
Albumin: 3.8 g/dL (ref 3.5–5.2)
Alkaline Phosphatase: 69 U/L (ref 39–117)
BUN: 19 mg/dL (ref 6–23)
CO2: 29 mEq/L (ref 19–32)
Calcium: 9.2 mg/dL (ref 8.4–10.5)
Chloride: 101 mEq/L (ref 96–112)
Creatinine, Ser: 0.85 mg/dL (ref 0.40–1.20)
GFR: 66.81 mL/min (ref >60.00)
Glucose, Bld: 116 mg/dL — ABNORMAL HIGH (ref 70–99)
Potassium: 3.2 mEq/L — ABNORMAL LOW (ref 3.5–5.1)
Sodium: 140 mEq/L (ref 135–145)
Total Bilirubin: 1.1 mg/dL (ref 0.2–1.2)
Total Protein: 6.8 g/dL (ref 6.0–8.3)

## 2014-07-13 LAB — POCT INR: INR: 3.1 — AB (ref 0.9–1.1)

## 2014-07-13 LAB — CBC WITH DIFFERENTIAL/PLATELET
Basophils Absolute: 0 10*3/uL (ref 0.0–0.1)
Basophils Relative: 0.4 % (ref 0.0–3.0)
Eosinophils Absolute: 0.1 10*3/uL (ref 0.0–0.7)
Eosinophils Relative: 1.3 % (ref 0.0–5.0)
HCT: 41.8 % (ref 36.0–46.0)
Hemoglobin: 14.2 g/dL (ref 12.0–15.0)
Lymphocytes Relative: 43 % (ref 12.0–46.0)
Lymphs Abs: 3.6 10*3/uL (ref 0.7–4.0)
MCHC: 34 g/dL (ref 30.0–36.0)
MCV: 87.6 fl (ref 78.0–100.0)
Monocytes Absolute: 0.5 10*3/uL (ref 0.1–1.0)
Monocytes Relative: 6.2 % (ref 3.0–12.0)
Neutro Abs: 4.1 10*3/uL (ref 1.4–7.7)
Neutrophils Relative %: 49.1 % (ref 43.0–77.0)
Platelets: 180 10*3/uL (ref 150.0–400.0)
RBC: 4.77 Mil/uL (ref 3.87–5.11)
RDW: 13.1 % (ref 11.5–15.5)
WBC: 8.4 10*3/uL (ref 4.0–10.5)

## 2014-07-13 LAB — POCT URINALYSIS DIPSTICK
Bilirubin, UA: NEGATIVE
Glucose, UA: NEGATIVE
Ketones, UA: NEGATIVE
Nitrite, UA: NEGATIVE
Protein, UA: NEGATIVE
Spec Grav, UA: <=1.005
Urobilinogen, UA: 0.2
pH, UA: 6.5

## 2014-07-13 LAB — PROTIME-INR
INR: 2.9 ratio — ABNORMAL HIGH (ref 0.8–1.0)
Prothrombin Time: 31.2 s — ABNORMAL HIGH (ref 9.6–13.1)

## 2014-07-13 LAB — TSH: TSH: 2.25 u[IU]/mL (ref 0.35–4.50)

## 2014-07-13 LAB — VITAMIN B12: Vitamin B-12: 492 pg/mL (ref 211–911)

## 2014-07-13 NOTE — Addendum Note (Signed)
Addended by: Karlene Einstein D on: 07/13/2014 02:11 PM   Modules accepted: Orders

## 2014-07-13 NOTE — Progress Notes (Signed)
Pre visit review using our clinic review tool, if applicable. No additional management support is needed unless otherwise documented below in the visit note. 

## 2014-07-13 NOTE — Patient Instructions (Signed)
Labs today.   Follow up in 3 months.  

## 2014-07-13 NOTE — Assessment & Plan Note (Addendum)
Mild fatigue noted by pt. Likely multifactorial with ongoing atrial fibrillation, IBS. Will check CBC, TSH, B12, CMP and UA with labs.

## 2014-07-13 NOTE — Assessment & Plan Note (Signed)
AFIB on exam today.  Continue rate control with Diltiazem and Metoprolol. Anticoagulated with coumadin. INR check today.

## 2014-07-13 NOTE — Progress Notes (Addendum)
Subjective:    Patient ID: Kara [name], female    DOB: February 27, 1923, 81 y.o.   MRN: [id number]  HPI  81YO female presents for follow up.  Last seen 1/26 for UTI.  Feeling well today, however notes some occasional fatigue. Occasionally has mild low back pain. No chest pain, palpitations. No dyspnea. Compliant with meds.  Past medical, surgical, family and social history per today's encounter.  Review of Systems  Constitutional: Positive for fatigue. Negative for fever, chills, appetite change and unexpected weight change.  Eyes: Negative for visual disturbance.  Respiratory: Negative for shortness of breath.   Cardiovascular: Negative for chest pain, palpitations and leg swelling.  Gastrointestinal: Negative for abdominal pain, diarrhea and constipation.  Musculoskeletal: Positive for back pain and arthralgias. Negative for myalgias.  Skin: Negative for color change and rash.  Hematological: Negative for adenopathy. Does not bruise/bleed easily.  Psychiatric/Behavioral: Negative for sleep disturbance and dysphoric mood. The patient is not nervous/anxious.        Objective:    BP 117/72 mmHg  Pulse 78  Temp(Src) 97.8 F (36.6 C) (Oral)  Ht 5' 1.5" (1.562 m)  Wt 129 lb (58.514 kg)  BMI 23.98 kg/m2  SpO2 96% Physical Exam  Constitutional: She is oriented to person, place, and time. She appears well-developed and well-nourished. No distress.  HENT:  Head: Normocephalic and atraumatic.  Right Ear: External ear normal.  Left Ear: External ear normal.  Nose: Nose normal.  Mouth/Throat: Oropharynx is clear and moist. No oropharyngeal exudate.  Eyes: Conjunctivae are normal. Pupils are equal, round, and reactive to light. Right eye exhibits no discharge. Left eye exhibits no discharge. No scleral icterus.  Neck: Normal range of motion. Neck supple. No tracheal deviation present. No thyromegaly present.  Cardiovascular: Normal rate, normal heart sounds and intact distal pulses.   An irregularly irregular rhythm present. Exam reveals no gallop and no friction rub.   No murmur heard. Pulmonary/Chest: Effort normal and breath sounds normal. No respiratory distress. She has no wheezes. She has no rales. She exhibits no tenderness.  Musculoskeletal: Normal range of motion. She exhibits no edema or tenderness.  Lymphadenopathy:    She has no cervical adenopathy.  Neurological: She is alert and oriented to person, place, and time. No cranial nerve deficit. She exhibits normal muscle tone. Coordination normal.  Skin: Skin is warm and dry. No rash noted. She is not diaphoretic. No erythema. No pallor.  Psychiatric: She has a normal mood and affect. Her behavior is normal. Judgment and thought content normal.          Assessment & Plan:  Over 75min of which >50% spent in face-to-face contact with patient discussing plan of care  Problem List Items Addressed This Visit      Unprioritized   Atrial fibrillation    AFIB on exam today.  Continue rate control with Diltiazem and Metoprolol. Anticoagulated with coumadin. INR check today.      Chronic anticoagulation    Will check INR with labs today. Goal 2-3.      Relevant Orders   Protime-INR   Fatigue - Primary    Mild fatigue noted by pt. Likely multifactorial with ongoing atrial fibrillation, IBS. Will check CBC, TSH, B12, CMP and UA with labs.      Relevant Orders   CBC with Differential/Platelet   Comprehensive metabolic panel   TSH   POCT urinalysis dipstick   B12       Return in about 3 months (around  10/11/2014) for Recheck.

## 2014-07-13 NOTE — Assessment & Plan Note (Signed)
Will check INR with labs today. Goal 2-3.

## 2014-07-16 LAB — CULTURE, URINE COMPREHENSIVE: Colony Count: 80000

## 2014-07-17 ENCOUNTER — Telehealth: Payer: Self-pay | Admitting: Internal Medicine

## 2014-07-17 ENCOUNTER — Other Ambulatory Visit: Payer: Self-pay | Admitting: *Deleted

## 2014-07-17 DIAGNOSIS — N390 Urinary tract infection, site not specified: Secondary | ICD-10-CM

## 2014-07-17 MED ORDER — CEPHALEXIN 500 MG PO CAPS: 500.0000 mg | ORAL_CAPSULE | Freq: Three times a day (TID) | ORAL | Status: DC

## 2014-07-17 NOTE — Telephone Encounter (Signed)
-----   Message from Cecelia Byars, RN sent at 07/17/2014 11:10 AM EST ----- Called and advised patient of results,  verbalized understanding. Rx sent to pharmacy by escript. Due to check PT/INR at home on 07/20/14. Pt states she had discussed with Dr. Gilford Rile previously about seeing urologist since she has had frequent UTIs recently. States she is willing to be referred to one if Dr. Gilford Rile recommends it at this point.

## 2014-07-18 ENCOUNTER — Ambulatory Visit: Payer: Medicare Other | Admitting: Internal Medicine

## 2014-07-19 ENCOUNTER — Other Ambulatory Visit (INDEPENDENT_AMBULATORY_CARE_PROVIDER_SITE_OTHER): Payer: Medicare Other

## 2014-07-19 ENCOUNTER — Telehealth: Payer: Self-pay | Admitting: *Deleted

## 2014-07-19 DIAGNOSIS — E876 Hypokalemia: Secondary | ICD-10-CM | POA: Diagnosis not present

## 2014-07-19 LAB — BASIC METABOLIC PANEL
BUN: 17 mg/dL (ref 6–23)
CO2: 27 mEq/L (ref 19–32)
Calcium: 9 mg/dL (ref 8.4–10.5)
Chloride: 104 mEq/L (ref 96–112)
Creatinine, Ser: 0.82 mg/dL (ref 0.40–1.20)
GFR: 69.64 mL/min (ref >60.00)
Glucose, Bld: 110 mg/dL — ABNORMAL HIGH (ref 70–99)
Potassium: 3.8 mEq/L (ref 3.5–5.1)
Sodium: 138 mEq/L (ref 135–145)

## 2014-07-19 NOTE — Telephone Encounter (Signed)
Labs and dx?  

## 2014-07-19 NOTE — Telephone Encounter (Signed)
BMP for hypokalemia 

## 2014-07-20 LAB — POCT INR: INR: 1.8 — AB (ref <=1.1)

## 2014-07-24 DIAGNOSIS — N952 Postmenopausal atrophic vaginitis: Secondary | ICD-10-CM | POA: Diagnosis not present

## 2014-07-24 DIAGNOSIS — R312 Other microscopic hematuria: Secondary | ICD-10-CM | POA: Diagnosis not present

## 2014-07-24 DIAGNOSIS — N390 Urinary tract infection, site not specified: Secondary | ICD-10-CM | POA: Diagnosis not present

## 2014-07-24 DIAGNOSIS — B373 Candidiasis of vulva and vagina: Secondary | ICD-10-CM | POA: Diagnosis not present

## 2014-07-27 LAB — PROTIME-INR

## 2014-07-28 ENCOUNTER — Telehealth: Payer: Self-pay | Admitting: *Deleted

## 2014-07-28 NOTE — Telephone Encounter (Signed)
Left message, notifying pt.  

## 2014-07-28 NOTE — Telephone Encounter (Signed)
Pt has root canal scheduled 08/01/14. Wants to know if she is supposed to stop her Coumadin. Her dentist's office told her they don't usually tell their patient's to stop, but recommended she call her PCP. What do you suggest?  May leave message with advice

## 2014-07-28 NOTE — Telephone Encounter (Signed)
If they don't normally have pt stop coumadin for this procedure, then I would continue the Coumadin

## 2014-07-31 ENCOUNTER — Telehealth: Payer: Self-pay | Admitting: Internal Medicine

## 2014-07-31 NOTE — Telephone Encounter (Signed)
error 

## 2014-08-01 ENCOUNTER — Encounter: Payer: Self-pay | Admitting: Nurse Practitioner

## 2014-08-01 ENCOUNTER — Ambulatory Visit (INDEPENDENT_AMBULATORY_CARE_PROVIDER_SITE_OTHER): Payer: Medicare Other | Admitting: Nurse Practitioner

## 2014-08-01 VITALS — BP 116/68 | HR 90 | Temp 97.2°F | Resp 14 | Ht 61.5 in | Wt 127.0 lb

## 2014-08-01 DIAGNOSIS — R300 Dysuria: Secondary | ICD-10-CM | POA: Diagnosis not present

## 2014-08-01 DIAGNOSIS — N390 Urinary tract infection, site not specified: Secondary | ICD-10-CM

## 2014-08-01 LAB — POCT URINALYSIS DIPSTICK
Bilirubin, UA: NEGATIVE
Glucose, UA: NEGATIVE
Nitrite, UA: NEGATIVE
Spec Grav, UA: 1.02
Urobilinogen, UA: 0.2
pH, UA: 6

## 2014-08-01 NOTE — Progress Notes (Signed)
   Subjective:    Patient ID: Kara [name], female    DOB: [birth year], 81 y.o.   MRN: [id number]  HPI  Kara [name] is a 81 yo female with a CC of urinary frequency and s/p yeast infection treatment.   1) Recent UTI complaints in last few months. She reports being treated and sent to urology for further evaluation and treatment. She is confused today about who she was supposed to see because she went and the name she was given (Dr. Hollice Espy) was not listed. She saw Kara [name] and reports she thought she would see a doctor. She was sent for an x-ray she reports and could not find the place after 5 attempts and then gave up.   No complaints today about urinary issues or yeast infection.    Review of Systems  Constitutional: Negative for fever, chills, diaphoresis and fatigue.  Respiratory: Negative for chest tightness, shortness of breath and wheezing.   Cardiovascular: Negative for chest pain, palpitations and leg swelling.  Gastrointestinal: Negative for nausea, vomiting and diarrhea.  Genitourinary: Negative for dysuria, urgency, frequency, hematuria, flank pain, vaginal discharge, difficulty urinating and vaginal pain.  Skin: Negative for rash.  Neurological: Negative for dizziness, weakness, numbness and headaches.  Psychiatric/Behavioral: The patient is not nervous/anxious.        Objective:   Physical Exam  Constitutional: She is oriented to person, place, and time. She appears well-developed and well-nourished. No distress.  HENT:  Head: Normocephalic and atraumatic.  Right Ear: External ear normal.  Left Ear: External ear normal.  Cardiovascular: Normal rate, regular rhythm and normal heart sounds.  Exam reveals no gallop and no friction rub.   No murmur heard. Pulmonary/Chest: Effort normal and breath sounds normal. No respiratory distress. She has no wheezes. She has no rales. She exhibits no tenderness.  Neurological: She is alert and oriented to person, place,  and time. No cranial nerve deficit. She exhibits normal muscle tone. Coordination normal.  Skin: Skin is warm and dry. No rash noted. She is not diaphoretic.  Psychiatric: She has a normal mood and affect. Her behavior is normal. Judgment and thought content normal.      Assessment & Plan:

## 2014-08-01 NOTE — Patient Instructions (Signed)
Let us know if you have any more concerns.

## 2014-08-01 NOTE — Progress Notes (Signed)
Pre visit review using our clinic review tool, if applicable. No additional management support is needed unless otherwise documented below in the visit note. 

## 2014-08-03 ENCOUNTER — Other Ambulatory Visit: Payer: Self-pay | Admitting: Nurse Practitioner

## 2014-08-03 ENCOUNTER — Ambulatory Visit: Payer: Self-pay

## 2014-08-03 ENCOUNTER — Telehealth: Payer: Self-pay | Admitting: Nurse Practitioner

## 2014-08-03 DIAGNOSIS — I4891 Unspecified atrial fibrillation: Secondary | ICD-10-CM | POA: Diagnosis not present

## 2014-08-03 DIAGNOSIS — N390 Urinary tract infection, site not specified: Secondary | ICD-10-CM | POA: Diagnosis not present

## 2014-08-03 DIAGNOSIS — B9620 Unspecified Escherichia coli [E. coli] as the cause of diseases classified elsewhere: Secondary | ICD-10-CM | POA: Diagnosis not present

## 2014-08-03 LAB — PROTIME-INR

## 2014-08-03 MED ORDER — CEPHALEXIN 500 MG PO CAPS: 500.0000 mg | ORAL_CAPSULE | Freq: Two times a day (BID) | ORAL | Status: DC

## 2014-08-03 NOTE — Telephone Encounter (Signed)
error 

## 2014-08-04 LAB — CULTURE, URINE COMPREHENSIVE: Colony Count: >=100000

## 2014-08-06 DIAGNOSIS — N390 Urinary tract infection, site not specified: Secondary | ICD-10-CM | POA: Insufficient documentation

## 2014-08-06 NOTE — Assessment & Plan Note (Signed)
Sent to see Kara [name] and pt is here today thinking she was supposed to see someone else. She wanted clarification on who she was sent to for urology consult. Will screen urine again for resolution of UTI.

## 2014-08-07 ENCOUNTER — Other Ambulatory Visit: Payer: Self-pay | Admitting: Internal Medicine

## 2014-08-09 DIAGNOSIS — N390 Urinary tract infection, site not specified: Secondary | ICD-10-CM | POA: Diagnosis not present

## 2014-08-09 DIAGNOSIS — N952 Postmenopausal atrophic vaginitis: Secondary | ICD-10-CM | POA: Diagnosis not present

## 2014-08-10 DIAGNOSIS — I340 Nonrheumatic mitral (valve) insufficiency: Secondary | ICD-10-CM | POA: Diagnosis not present

## 2014-08-10 DIAGNOSIS — R600 Localized edema: Secondary | ICD-10-CM | POA: Diagnosis not present

## 2014-08-10 DIAGNOSIS — I480 Paroxysmal atrial fibrillation: Secondary | ICD-10-CM | POA: Diagnosis not present

## 2014-08-10 DIAGNOSIS — I2510 Atherosclerotic heart disease of native coronary artery without angina pectoris: Secondary | ICD-10-CM | POA: Diagnosis not present

## 2014-08-10 LAB — PROTIME-INR: INR: 3.9 — AB (ref 0.9–1.1)

## 2014-08-17 LAB — POCT INR: INR: 2.4

## 2014-08-23 DIAGNOSIS — N390 Urinary tract infection, site not specified: Secondary | ICD-10-CM | POA: Diagnosis not present

## 2014-08-23 DIAGNOSIS — N952 Postmenopausal atrophic vaginitis: Secondary | ICD-10-CM | POA: Diagnosis not present

## 2014-08-24 LAB — PROTIME-INR

## 2014-08-25 ENCOUNTER — Encounter: Payer: Self-pay | Admitting: Internal Medicine

## 2014-08-29 ENCOUNTER — Encounter: Payer: Self-pay | Admitting: Internal Medicine

## 2014-08-31 DIAGNOSIS — I4891 Unspecified atrial fibrillation: Secondary | ICD-10-CM | POA: Diagnosis not present

## 2014-08-31 LAB — POCT INR: INR: 2.7 — AB (ref 0.9–1.1)

## 2014-09-01 ENCOUNTER — Ambulatory Visit: Payer: Medicare Other | Admitting: Internal Medicine

## 2014-09-04 ENCOUNTER — Encounter: Payer: Self-pay | Admitting: Internal Medicine

## 2014-09-05 NOTE — Consult Note (Signed)
Chief complaint is abd pain. Pt feeling better, abd not tender to cough, very slight tender to deep palpation.  [place] report pending.  K 2.9, wbc 7, hgb 12, discussed possible CT with Hospitalist.  Most likely etiology is ischemic enteritis due to radiation and possible vascular component from atherosclerosis.  Pt wants to try to eat eggs and grits now so will order this.  Electronic Signatures: Manya Silvas (MD)  (Signed on 11-Aug-13 09:22)  Authored  Last Updated: 11-Aug-13 09:22 by Manya Silvas (MD)

## 2014-09-05 NOTE — Discharge Summary (Signed)
PATIENT NAME:  Kara [name], GIARRUSSO MR#:  [id number] DATE OF BIRTH:  1922-09-13  DATE OF ADMISSION:  12/27/2011 DATE OF DISCHARGE:  01/15/2012   FINAL DIAGNOSES:  1. 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  2. Intermittent and now rate controlled atrial fibrillation.  3. History of uterine carcinoma status post treatment with radiation and surgery.  4. Hyperlipidemia.  5. History of mitral regurgitation. 6. History of obstructive sleep apnea.  PRINCIPLE PROCEDURES:  1. Multiple consultations including Medicine, Gastroenterology, Cardiology, and Surgery.  2. Exploratory laparotomy with resection of small bowel and large bowel in the neoterminal ileum and transverse colon. 3. Placement of central line.  4. Total parenteral nutrition. 5. Several CT scans.   HOSPITAL COURSE: The patient was admitted to the medical service with recurrent abdominal pain. Surgical services as well as Gastroenterology became involved. It became obvious that the patient really had no improvement in her previous abdominal pain since her previous admission. In review of all of her CT scans and in conjunction with discussion with Dr. Vira Agar of GI Medicine, it was thought that the patient had either chronic ischemia of her ileum or radiation enteritis. As such after much discussion she was taken to the operating room on August 16th at which point resection of the ileum and portion of the ascending and transverse colon with primary anastomosis was able to be achieved. Pathology demonstrated chronic radiation enteritis. Postoperatively the patient had intermittent problems with atrial fibrillation for which Dr. Nehemiah Massed managed her atrial fibrillation appropriately with calcium channel blocker and beta-blockade. She did well with this. The wound was healing nicely. Nasogastric tube was left in for several days and the patient was immediately started on TPN postoperatively. Over the course of several days,  the patient had resolution of her ileus, had persistent diarrhea for which she was begun on Lomotil and Questran with some improvement. C. difficile toxins were negative. Her laboratory values and hemoglobin remained stable postoperatively. With physical therapy the patient was deemed suitable for rehab candidate on transfer. As such, on August 29th the patient was transferred to local rehabilitation for comprehensive inpatient rehabilitation. She will follow-up with me on 01/22/2012 in the [facility] office.   DISCHARGE MEDICATIONS:  1. Metoprolol 25 mg by mouth b.i.d. 2. Coumadin 1 mg by mouth once a day.  3. Vitamin B12 one injection once a month. 4. GenTeal ophthalmic solution one drop each eye once in the morning. 5. Calcarb with D 600 400 units international by mouth once a day.  6. Vitamin D3 1000 units international by mouth once a day. 7. Protonix 20 mg by mouth delayed release tablet once a day.  8. Potassium chloride 10 mEq by mouth extended-release 1 cap p.o. b.i.d.  9. Tramadol 50 mg by mouth every six hours as needed for pain. 10. Lisinopril 5 mg by mouth daily.  11. Lunesta 2 mg by mouth at bedtime p.r.n. insomnia.  12. Questran 4 grams powder p.o. q.a.c.  13. Metoprolol 50 mg by mouth b.i.d.  14. Diamox 250 mg by mouth q.i.d.  15. Diltiazem extended-release 180 mg by mouth daily.  16. Xanax 0.25 mg by mouth every eight hours as needed for anxiety.   She is to call with any questions or concerns to the office.   TOTAL TIME SPENT: 30 minutes.   ____________________________ Jeannette How Marina Gravel, MD mab:drc D: 01/14/2012 22:56:00 ET T: 01/15/2012 09:33:06 ET JOB#: [id number]  cc: Elta Guadeloupe A. Marina Gravel, MD, <Dictator> Roberts Gaudy. Gorden Harms, MD Manya Silvas, MD  Corey Skains, MD  Kharter Brew Bettina Gavia MD ELECTRONICALLY SIGNED 01/15/2012 14:18

## 2014-09-05 NOTE — Discharge Summary (Signed)
PATIENT NAME:  Kara [name], Kara [name] MR#:  [id number] DATE OF BIRTH:  [birth year]  DATE OF ADMISSION:  12/02/2011 DATE OF DISCHARGE:  12/08/2011  For a detailed note, please see the History and Physical done on admission by Dr. Levonne Hubert.   DISCHARGE DIAGNOSES:  1. Abdominal pain, nausea, vomiting secondary to colitis/enteritis.  2. Enteritis. 3. Atrial fibrillation.  4. Hypertension.  5. Gastroesophageal reflux disease.  6. Hypokalemia.   DIET: The patient is being discharged on a low-residue, low-fat diet.   ACTIVITY: As tolerated.   REFERRALS: She is being referred for Home Health physical therapy and nursing services.   FOLLOWUP: Follow up with Dr. Dionne Milo in the next 1 to 2 weeks.   DISCHARGE MEDICATIONS:  1. Vitamin B12 1000 mcg monthly.  2. Calcium carbonate 600 mg, 1 tab daily.  3. Vitamin D3 1000 international units daily. 4. Tylenol with hydrocodone 5/325, 1 tab q. 6 hours as needed.  5. Protonix 40 mg daily.  6. Potassium 20 mEq b.i.d.  7. Maxzide 25 mg 1 tab daily.  8. Coumadin 1 mg 3 times weekly on Tuesday, Wednesday, Friday.  9. Coumadin 2 mg on Sunday, Monday, Thursday, and Saturday. The patient is to hold her Coumadin at least for the next two days.  10. Metoprolol tartrate 25 mg b.i.d.  11. Ciprofloxacin 500 mg b.i.d. times 7 days.  12. Flagyl 250 mg q.i.d. times 7 days.   [place] COURSE:  1. Dr. Dionne Milo, gastroenterology.  2. Dr. Sherri Rad, general surgery. 3. Dr. Phoebe Perch, general surgery. 4. Dr. Loistine Simas, gastroenterology.   PERTINENT STUDIES:  1. CT scan of the abdomen and pelvis July 15 showing findings consistent with enteritis. No evidence of loculated fluid collections or abscess. No CT evidence of bowel obstruction.  2. Chest x-ray showed right-sided PICC catheter at the region of the SVC. No other acute cardiopulmonary disease.   HOSPITAL COURSE: This is an 81 year old female with medical problems as mentioned above  who presented to the hospital with abdominal pain, nausea, and vomiting. Initial CT scan was consistent with ileal enteritis.  1. Enteritis: The patient was admitted to the hospital on 07/17 and started empirically on IV Cipro and Flagyl, although the Flagyl made her quite nauseous and she was taken off Flagyl and was started on IV ertapenem. A surgical consult was obtained. The patient was seen by Dr. Sherri Rad and also Dr. Leanora Cover.  They did not think the patient required any acute surgical intervention. She was maintained on IV antibiotics for a few days and bowel rest along with antiemetics and fluids. Slowly her diet was advanced from a clear liquid eventually to a full liquid and now to a low-residue diet, which she is able to tolerate without any further loose stools, nausea, or vomiting. She did have stool studies done which were negative for any C. difficile. The patient will likely benefit from a colonoscopy to be done in the near future within the next week, but this is to be arranged as an outpatient by Dr. Dionne Milo. Clinically the patient is tolerating p.o. well with no further exacerbations of her nausea, vomiting, or abdominal pain and  therefore is being discharged home.  2. Atrial fibrillation: The patient did have intermittent episodes of tachycardia during the hospital course. Prior to coming to the hospital the patient was only taking metoprolol as needed. She required both metoprolol and Cardizem to get her heart rate under better control. Currently she is rate controlled in the  60s. She is being discharged on scheduled metoprolol 25 mg b.i.d. She was continued on her Coumadin. INR currently is therapeutic at 3, but since she is going to be on antibiotics I have told her to hold her Coumadin for the next couple of days prior to reinitiating it. She currently does not appear to be in congestive heart failure.  3. Hypertension: The patient remained hemodynamically stable, maintained on her  metoprolol and triamterene/ HCTZ, which she will continue.  4. Gastroesophageal reflux disease: The patient was maintained on Protonix. She will resume that.  5. Hypokalemia: This was likely secondary to her chronic diarrhea. This has since then been supplemented and resolved.  6. The patient was evaluated by physical therapy and thought she would benefit from [facility] and nursing services, which are being arranged for her prior to discharge.   CODE STATUS: The patient is a FULL CODE.  TIME SPENT WITH THE DISCHARGE: 40 minutes.    ____________________________ Belia Heman. Verdell Carmine, MD vjs:bjt D: 12/08/2011 15:12:00 ET T: 12/09/2011 12:27:19 ET JOB#: [id number]  cc: Belia Heman. Verdell Carmine, MD, <Dictator> Jill Side, MD Henreitta Leber MD ELECTRONICALLY SIGNED 12/12/2011 16:18

## 2014-09-05 NOTE — Consult Note (Signed)
PATIENT NAME:  ALIANNA, WURSTER MR#:  [id number] DATE OF BIRTH:  09/18/1922  DATE OF CONSULTATION:  12/28/2011  REFERRING PHYSICIAN:   CONSULTING PHYSICIAN:  Valeriano Bain A. Sy Saintjean, MD  INDICATION FOR CONSULT:  Patient with nausea, vomiting, recurrent abdominal pain, and diarrhea.  HISTORY OF PRESENT ILLNESS:  Mrs. Vultaggio is a pleasant 81 year old female with a complicated past medical history including right hemicolectomy for nonresectable polyp and hysterectomy for uterine cancer requiring postoperative radiation, who presents with one day of nausea, vomiting, and subsequent diarrhea.  She was recently admitted approximately three weeks ago and discharged with the diagnosis of enteritis and abdominal pain.  She was sent home on ciprofloxacin and Flagyl at that time.  Since then she said she has not had nausea and emesis but has not had a lot of appetite and hasn't really been feeling very well or drinking a lot.  She said that she began yesterday acutely with being nauseated and vomiting, nonbilious, nonbloody vomit, and also reports that she has had multiple bowel movements today.  She feels that her belly is bloated as well.  She said that she has a mild, dull pain particularly in her right lower quadrant.  Otherwise  no fevers, chills, night sweats, shortness of breath, chest pain, dysuria, hematuria, difficulty moving, no joint pain or swelling.  REVIEW OF SYSTEMS: A total 14 point review of systems was performed.  Pertinent positives and negatives are stated above.  PAST MEDICAL HISTORY:   1. Recently admitted a month ago with nausea, vomiting, and diarrhea secondary to colitis and enteritis. 2. History of right hemicolectomy for nonresectable polyp, she said, in the 1970s. 3. Status post hysterectomy for uterine cancer with radiation in the 1970s. 4. Paroxysmal atrial fibrillation. 5. Hypertension. 6. Gastroesophageal reflux disease. 7. Hypokalemia. 8. Coronary artery disease status post  stents 2007. 9. History of pacemaker placement. 10. Hyperlipidemia. 11. Mitral regurgitation. 12. Irritable bowel syndrome. 13. Obstructive sleep apnea. 14. Plantar fasciitis. 15. Fibrocystic disease of the breast.  PAST SURGICAL HISTORY: 1. History of appendectomy. 2. History of cataract surgery. 3. History of umbilical hernia repair. 4. History of pacemaker placement. 5. History of tonsillectomy.  SOCIAL HISTORY:  Lives in the [place] at [place].  Per chart, she is widowed, has three children, is a retired Radio producer.  No recent tobacco.  Alcohol only rarely and socially.  No drug use.  FAMILY HISTORY:  Coronary artery disease, family history of heart failure.  ADMISSION MEDICATIONS: 1. Coumadin. 2. Potassium chloride. 3. Vitamin B12. 4. Vitamin D. 5. Protonix. 6. Metoprolol. 7. Maxzide. 8. GenTeal ophthalmic drops. 9. Calcium. 10. Acetaminophen.  ALLERGIES:  Amoxicillin, Crestor, Pravachol, Zocor, Vioxx.  PHYSICAL EXAMINATION: VITAL SIGNS:  Temperature 97.6, pulse 60, blood pressure 105/62, respirations 20, pulse ox 95% on room air.  GENERAL:  No acute distress.  Alert and oriented times three.  HEAD:  Normocephalic, atraumatic.  EYES:  No scleral icterus.  No conjunctivitis.  FACE:  No obvious trauma. Normal external nose and ears.  CHEST:  Lungs clear to auscultation.  ABDOMEN:  Soft, nontender.  HEART:  Regular rate and rhythm.  ABDOMEN:  Soft, nontender except some slight tenderness in the right lower quadrant.  Does appear to be slightly distended.  EXTREMITIES:  Moves all extremities well.  SKIN:  No obvious skin lesions or growths.  NEURO:  Cranial nerves II-XII grossly intact.  LABORATORY, DIAGNOSTIC, AND RADIOLOGICAL DATA:  Current white blood cell count is 7, down from 16.1 on admission.  Hemoglobin 12,  hematocrit 35.1, down from 15.1 and 45 on admission.  Platelets 194.  Differential is 50% neutrophils.   INR is 1.3.  Renal panel is  significant for bicarb of 30, potassium 2.9 currently, creatinine 1.11.  IMAGING:  She had imaging on 08/09 which showed stranding around the terminal ileum and thickening.  She has similar if not possibly more severe finding on CT of the abdomen, which was performed today. There was no pelvic on that, so it is difficult to assess pelvis.  ASSESSMENT AND PLAN:  Mrs. Segall is a pleasant, 81 year old female with history of right hemicolectomy and hysterectomy status post radiation.  She has had nausea and vomiting and diarrhea times one day.  She had a CT scan which is concerning for stranding around her terminal ileum.  Etiology is uncertain, but is possibly due to radiation enteritis per GI consult.  No acute surgery issues.  We will continue to follow with you to ensure patient's symptoms have resolved.       Thank you for allowing me to take part in caring for this interesting patient.   ____________________________ Glena Norfolk. Aleysia Oltmann, MD cal:bjt D: 12/28/2011 19:23:37 ET T: 12/29/2011 10:17:56 ET JOB#: [id number]  cc: Harrell Gave A. Dannia Snook, MD, <Dictator> Floyde Parkins MD ELECTRONICALLY SIGNED 12/30/2011 7:15

## 2014-09-05 NOTE — Consult Note (Signed)
Pt has worse pain and tenderness today.  She has enteritis in a loop of bowel on CT.  Likely related to vascular compromise/radiation therapy.  She likely will not get better without surgery which will carry some risk but is only solution.  Electronic Signatures: Manya Silvas (MD)  (Signed on 12-Aug-13 18:25)  Authored  Last Updated: 12-Aug-13 18:25 by Manya Silvas (MD)

## 2014-09-05 NOTE — Op Note (Signed)
PATIENT NAME:  Kara [name], Kara [name] MR#:  [id number] DATE OF BIRTH:  [birth year]  DATE OF PROCEDURE:  01/02/2012  PREOPERATIVE DIAGNOSIS: Chronic ischemia of terminal ileum.   POSTOPERATIVE DIAGNOSIS: Chronic ischemia of terminal ileum.  PROCEDURE PERFORMED:   1. Exploratory laparotomy with extensive lysis of adhesions.  2. Resection of ileum and portion of ascending and transverse colon with primary anastomosis.  3. Insertion of right subclavian vein triple lumen catheter.  4. Partial omentectomy.  SURGEON: Sherri Rad MD   ASSISTANT: None.   ANESTHESIA: General endotracheal.   FINDINGS: There was a thickened matted loop of small bowel in the right upper quadrant around the area of the previous anastomosis and right hemicolectomy which was thickened and appeared chronically scarified.   SPECIMENS: As described above.   ESTIMATED BLOOD LOSS: 400 mL.   DRAINS: None.   COUNTS: Lap and needle count correct x2.   DESCRIPTION OF PROCEDURE: With the patient in the supine position, general anesthesia was induced. The patient was then positioned in Trendelenburg. The head and neck was turned to the left. The right neck and upper chest was sterilely prepped and draped with ChloraPrep solution. Time out was observed. On first pass, a needle was placed directly into the subclavian vein with return of nonpulsatile venous-appearing blood. A wire was passed. A skin incision was fashioned over the wire. An 8-French percutaneous dilatation was then performed of the skin tract and vein. The catheter had been previously flushed on the back table and was inserted and secured at 18 cm with silk suture, the wire and dilator having been removed. Sterile occlusive dressing was placed. At this point, the abdomen was sterilely prepped and draped with Betadine solution. A second time out was observed.   A primary midline skin incision was fashioned from above the umbilicus down to the pubic symphysis. Adhesiolysis was  undertaken with a combination of sharp dissection and point cautery. There was a large mass effect in the right upper quadrant consistent with loops of bowel stuck to the retroperitoneum. This was all carefully taken down with tedious gentle dissection and sharp dissection exposing the course of the right kidney as well as a portion of Gerota's fascia which was opened in this process. Furthermore, the duodenum was identified and swept inferiorly off this mass. Once this loop of intestine was able to be liberated to the midline, it became quite obvious that this portion of bowel had become very ischemic prior to even mesenteric vessels being divided. A section of intestine measuring approximately 20 cm was removed of the ileum, and this bowel was then divided in what appeared to be normal proximal ileum with a single fire of the GIA-75 stapler. Likewise, the omentum was taken down off the transverse colon in the avascular plane with cautery. The mid transverse colon was divided to the right of the middle colic vessel with a second fire of the GIA-75 stapler. The intervening mesentery was thickened and rather foreshortened and, as such, the mesenteric transection occurred just underneath the bowel line itself with a combination of sharp dissection in the peritoneal layer, clamps and ties of #0 Vicryl suture, as well as the LigaSure apparatus. The specimen was then handed off the field. It was opened on the back table and found to be rather thickened consistent with chronic ischemia. It should be noted that this loop of matted intestine was in no way in the field of the previous uterine pelvic irradiation.   A side-to-side functional end-to-end anastomosis was  then created between the ileum and transverse colon by aligning the two limbs with seromuscular 3-0 silk suture. Enterotomies were fashioned in the tip of the staple line with Mayo scissors. A GIA-75 stapler load was used to create a common channel enterotomy.  A TA-60 stapler was used to close the end of the colon and small bowel. Any traction stitches were placed at the crotch of the incision with 3-0 silk. Staple lines were imbricated with 3-0 silk suture. Intervening epiploic fat was used to cover the anastomosis as well. The right upper quadrant was then copiously irrigated. The patient had been administered indigo carmine by Anesthesia intravenously. There was return of blue urine within the Foley. I packed a laparotomy tape in the right upper quadrant in the area of dissection, and there was no evidence of blue tinge.   With lap and needle counts correct x2, the abdomen was copiously irrigated and aspirated dry. Hemostasis appeared to be adequate on the operative field. A small portion of omentum which had become devascularized during the procedure was excised and submitted as additional specimen. Seprafilm was placed between the omentum and the anterior abdominal wall and cover the anastomosis in its entirety. The abdominal midline fascia was closed with running #1 Vicryl from the extremies  of the wound with interrupted #1 Vicryl as internal retention sutures. Subcutaneous tissues were irrigated and aspirated dry, and point hemostasis was ensured with the cautery device. Skin edges were reapproximated with the skin stapler. The patient was then subsequently extubated and taken to the recovery room in stable and satisfactory condition with sterile dressing in place.   ____________________________ Kara [name]. Kara Gravel, MD mab:cbb D: 01/03/2012 16:01:43 ET T: 01/04/2012 10:50:08 ET JOB#: [id number]  cc: Kara Guadeloupe A. Kara Gravel, MD, <Dictator> Kara [name]. Kara Harms, MD Kara Silvas, MD Kara Skains, MD Kara Ocasio Bettina Gavia MD ELECTRONICALLY SIGNED 01/07/2012 15:07

## 2014-09-05 NOTE — Consult Note (Signed)
PATIENT NAME:  Kara [name], Kara [name] MR#:  [id number] DATE OF BIRTH:  01/05/1923  DATE OF CONSULTATION:  12/27/2011  REFERRING PHYSICIAN:   CONSULTING PHYSICIAN:  Manya Silvas, MD  PRIMARY CARE PHYSICIAN: Dr. Ronette Deter.   CHIEF COMPLAINT: Abdominal pain with vomiting.   HISTORY: Ms. Bahri is an 81 year old white female who was admitted last month with abdominal pain, diarrhea. CT scan was consistent with enteritis. She was seen by Dr. Dionne Milo. She was discharged home after being in the hospital for almost a week, was home for a few weeks and began having abdominal discomfort, nausea and vomiting yesterday. Yesterday the discomfort was fairly continuous. Today it is off and on and she feels better than yesterday but she still has had some vomiting..   She had a CT scan of the abdomen yesterday that showed persistent appearance of a thickening in the wall of a loop of small bowel in the right pelvis and mid abdominal area with some adjacent inflammatory stranding, mildly prominent loops of small bowel with air-fluid levels present. I was asked to see her in consultation.   PAST MEDICAL HISTORY:  1. Paroxysmal atrial fibrillation.  2. Hypertension.  3. Gastroesophageal reflux disease. 4. Coronary artery disease, previous stents.  5. Pacemaker insertion.  6. Previous colon resection for polyps.  7. Hyperlipidemia.  8. Mitral regurgitation.  9. Irritable bowel.  10. Sleep apnea.  11. History of uterine cancer, previous hysterectomy with radiation therapy.   PAST SURGICAL HISTORY: 1. Appendectomy.  2. Cataract surgery. 3. Umbilical hernia repair. 4. Pacemaker placement.  5. Tonsillectomy.  6. Hysterectomy with radiation therapy.   HABITS: Ex-smoker, drinks rarely and socially.   FAMILY HISTORY: Coronary artery disease in father and mother with abdominal cancer. Sister with cancer and congestive heart failure.   MEDICATIONS ON ADMISSION:  1. Coumadin 1 mg a day.  2. Potassium  chloride 10 mEq twice a day. 3. Vitamin B12 one shot a month.  4. Vitamin D3 one a day.  5. Protonix 40 mg a day.  6. Metoprolol 25 mg twice a day.  7. Maxzide 25 mg a day. 8. Genteel ophthalmic drops. 9. Cipro 500 mg twice a day.   ALLERGIES: Amoxicillin, Crestor, Pravachol, Zocor.   REVIEW OF SYSTEMS: Obtained by Dr. Lenore Manner. The patient denies fever. No chills. No blurred vision. No sore throat. No dysphagia. No chest pains. No shortness of breath. No joint pain or swelling. No skin rash.   PHYSICAL EXAMINATION:  GENERAL: Elderly white female in no acute distress, pleasant, awake and alert.   VITAL SIGNS: Temperature 96.2, pulse 60, respirations 18, blood pressure 126/74.   GENERAL: Elderly white female in no acute distress.   HEENT: Sclerae anicteric. Conjunctivae negative. Tongue negative. Head is atraumatic. Trachea is in the midline.   CHEST: Clear.   HEART: Some irregular beats 1/6 systolic murmur.   ABDOMEN: There is fullness in the lower abdomen right mid lower. The upper abdomen is unremarkable.   RECTAL: Exam not done.   LABORATORY, RADIOLOGICAL AND DIAGNOSTIC DATA: Glucose 196, BUN 21, creatinine 1.09, sodium 136, potassium 3.5, chloride 95, CO2 28. Liver panel normal except total bilirubin 1.4, white count 16, hemoglobin 15, platelet count 271. ProTime is 16.2. Urinalysis unremarkable. CT scan of the abdomen: Extensive atherosclerotic calcification, some slightly prominent loops of small bowel. CT scan was done without oral or IV contrast.   ASSESSMENT: The patient has history of radiation therapy to the lower abdomen for uterine cancer as well as surgery.  Her enteritis could well be and most likely is related to radiation therapy. This causes an endarteritis which causes mucosal damage and necrosis.   Because of her atrial fibrillation, ischemic colitis due to other reasons and previous radiation therapy are always possible.   I doubt recurrence of infections,  cannot be sure about that given the white count, but it is much less likely.   RECOMMENDATIONS:  1. Repeat a CT scan with IV and oral contrast given that her creatinine is 1.09. This should be safe to do. This will give a better view and we can do a CT angiogram on the reconstructed films.  2. Consideration for a capsule study could be done after this CT scan study if it was felt necessary, but this should only be done if the patient is a good surgical candidate.   I will follow with you.  ____________________________ Manya Silvas, MD rte:ap D: 12/27/2011 16:57:48 ET           T: 12/28/2011 07:02:21 ET            JOB#: [id number] cc: Manya Silvas, MD, <Dictator> Eduard Clos. Gilford Rile, MD Jill Side, MD Clovis Pu. Lenore Manner, MD Manya Silvas MD ELECTRONICALLY SIGNED 12/29/2011 15:46

## 2014-09-05 NOTE — Consult Note (Signed)
Chief Complaint:   Subjective/Chief Complaint Seeing patient for Dr Dionne Milo. continues with diarrhea, but abdominal pain much improved.  minimal nausea   VITAL SIGNS/ANCILLARY NOTES: **Vital Signs.:   20-Jul-13 13:51   Vital Signs Type Routine   Temperature Temperature (F) 98.2   Celsius 36.7   Temperature Source oral   Pulse Pulse 109   Respirations Respirations 18   Systolic BP Systolic BP 010   Diastolic BP (mmHg) Diastolic BP (mmHg) 83   Mean BP 102   Pulse Ox % Pulse Ox % 90   Pulse Ox Activity Level  At rest   Oxygen Delivery Room Air/ 21 %  *Intake and Output.:   20-Jul-13 00:21   Stool  small loose and watery stool, light brown to yellow in color.    11:51   Stool  small loose    13:52   Stool  med soft    19:25   Stool  medium loose stool   Brief Assessment:   Cardiac Irregular    Respiratory clear BS    Gastrointestinal details normal Soft  Nontender  Nondistended  No masses palpable  Bowel sounds normal   Lab Results: Routine Chem:  20-Jul-13 06:53    Result Comment PT - This specimen was collected through an   - indwelling catheter or arterial line.  - A minimum of 70mls of blood was wasted prior    - to collecting the sample.  Interpret  - results with caution.  Result(s) reported on 06 Dec 2011 at 09:28AM.  Routine Sero:  18-Jul-13 11:12    Occult Blood, Feces NEGATIVE (Result(s) reported on 04 Dec 2011 at 01:25PM.)  Routine Coag:  20-Jul-13 06:53    Prothrombin  29.6   INR 2.8 (INR reference interval applies to patients on anticoagulant therapy. A single INR therapeutic range for coumarins is not optimal for all indications; however, the suggested range for most indications is 2.0 - 3.0. Exceptions to the INR Reference Range may include: Prosthetic heart valves, acute myocardial infarction, prevention of myocardial infarction, and combinations of aspirin and anticoagulant. The need for a higher or lower target INR must be assessed  individually. Reference: The Pharmacology and Management of the Vitamin K  antagonists: the seventh ACCP Conference on Antithrombotic and Thrombolytic Therapy. XNATF.5732 Sept:126 (3suppl): [id number]. A HCT value >55% may artifactually increase the PT.  In one study,  the increase was an average of 25%. Reference:  "Effect on Routine and Special Coagulation Testing Values of Citrate Anticoagulant Adjustment in Patients with High HCT Values." American Journal of Clinical Pathology 2006;126:400-405.)   Assessment/Plan:  Assessment/Plan:   Assessment 1) enteritis/ileitis-unsure etiology, improving on abx.   2) h/o chronic diarrhea    Plan 1) awaiting inflammatory disease serology panel.   2) agree with colonoscopy next week per Dr Dionne Milo if still with sx.  As she is improving, possible to to as o/p.  following.   Electronic Signatures: Loistine Simas (MD)  (Signed 20-Jul-13 20:23)  Authored: Chief Complaint, VITAL SIGNS/ANCILLARY NOTES, Brief Assessment, Lab Results, Assessment/Plan   Last Updated: 20-Jul-13 20:23 by Loistine Simas (MD)

## 2014-09-05 NOTE — Consult Note (Signed)
PATIENT NAME:  Kara [name], Kara [name] MR#:  [id number] DATE OF BIRTH:  06-16-1922  DATE OF CONSULTATION:  12/03/2011  REFERRING PHYSICIAN:   CONSULTING PHYSICIAN:  Jill Side, MD  REASON FOR CONSULTATION: Abdominal pain, diarrhea.   HISTORY OF PRESENT ILLNESS: 81 year old Caucasian female with multiple medical problems. Patient has history of chronic diarrhea as well as paroxysmal atrial fibrillation. She presented with abdominal pain of about five days duration with significant worsening of her diarrhea. It started with nausea, vomiting, and diarrhea as well as diffuse abdominal pain which was later localized mostly into the right lower quadrant area. She has been complaining of multiple small mucousy watery bowel movements every day. Denies any rectal bleeding. Patient's lipase was elevated at 806 although CT scan was more consistent with enteritis with thickening of the wall of the distal small bowel loops. Patient was initially discharged home although came back to the Emergency Room and was subsequently admitted. Patient was also found to be in atrial fibrillation with rapid ventricular heart rate at the time of admission. Patient has been started on IV Flagyl and Cipro for suspected infectious enteritis. Patient denies any prior history of Crohn's disease or any other small bowel problems. Since admission to the hospital in the last 48 hours patient's diarrhea has completely subsided and she has not had a bowel movement. She is passing a small amount of flatus though.    PAST MEDICAL HISTORY:  1. History of syncope in 2008, details are not known. 2. History of paroxysmal atrial fibrillation. 3. Coronary artery disease.  4. Hyperlipidemia.  5. Hypertension.  6. Mitral regurgitation. 7. Gastroesophageal reflux disease. 8. History of uterine cancer. 9. Irritable bowel syndrome. 10. Chronic diarrhea. 11. History of adenomatous polyps.  12. History of glaucoma. 13. Appendicitis. 14. Obstructive  sleep apnea.  15. Fibrocystic breast disease. 16. Plantar fasciitis.    PAST SURGICAL HISTORY:  1. History of colonoscopy in 2008.  2. History of tonsillectomy.  3. Pacemaker placement. 4. Appendectomy.   HOME MEDICATIONS:  1. Percocet. 2. Calcium. 3. Coumadin. 4. Metoprolol. 5. Pantoprazole. 6. Potassium chloride.   ALLERGIES: Amoxicillin, Crestor, Pravachol, Vioxx and Zocor.   SOCIAL HISTORY: She lives at an assisted living facility. Does not smoke or drink.   REVIEW OF SYSTEMS: Grossly negative except for what is mentioned in the History of Present Illness.  PHYSICAL EXAMINATION: GENERAL: Patient does appear to be in some distress due to abdominal pain as well as diarrhea and nausea.   VITAL SIGNS: She is hemodynamically stable since admission with to no fever, heart rate is in 60s, respirations 18 to 20, blood pressure 139/64.   HEENT: Unremarkable. She is not jaundiced or anemic.   NECK: Neck veins are flat.   LUNGS: Grossly clear to auscultation bilaterally with fair air entry and no added sounds.   CARDIOVASCULAR: Regular rate and rhythm. No gallops or murmur.   ABDOMEN: Somewhat distended. On initial examination significant right lower quadrant tenderness was noted although today this tenderness is less pronounced. There is no rebound. Bowel sounds are very infrequent and sluggish.   EXTREMITIES: No edema.   NEUROLOGIC: Examination appears to be unremarkable.   LABORATORY, DIAGNOSTIC, AND RADIOLOGICAL DATA: On initial presentation her white cell count 13,000, it went up to 15,000, although it is now normal today at 9000. Hemoglobin 10.5, hematocrit 32.6, platelet count 180. Chemistries are fine. BUN and creatinine normal. Liver enzymes are unremarkable, although total bilirubin is slightly elevated at 1.8. INR is now normal after withholding  her Coumadin. CT scan of abdomen and pelvis as above. Repeat abdominal x-rays done today showed nonobstructed bowel gas  pattern. No free air.   ASSESSMENT AND PLAN: Patient with what appears to be enteritis as suggested by CT scan. This could either be infectious, inflammatory or less likely ischemic. Patient has been hydrated. She is on IV antibiotics. White cell count has become normal and she is afebrile, although she continues to complain of nausea. Due to that surgical consultation was obtained today. Patient has been evaluated by Dr. Sherri Rad who agrees with current management plan. Will keep her on clear liquid diet for now. Serial abdominal examinations. IBD panel has been requested to rule out Crohn's disease. When patient's overall condition is better and she is able to tolerate liquids she would probably benefit from a colonoscopy plus/minus video capsule endoscopy. Further recommendations to follow depending on the patient's hospital course.   ____________________________ Jill Side, MD si:cms D: 12/03/2011 21:45:36 ET T: 12/04/2011 10:12:28 ET JOB#: [id number]  cc: Jill Side, MD, <Dictator>  Jill Side MD ELECTRONICALLY SIGNED 12/19/2011 13:23

## 2014-09-05 NOTE — H&P (Signed)
PATIENT NAME:  Kara [name], Kara [name] MR#:  [id number] DATE OF BIRTH:  [birth year]  DATE OF ADMISSION:  12/02/2011  CHIEF COMPLAINT: Abdominal pain and diarrhea.   HISTORY OF PRESENT ILLNESS: 81 year old Caucasian female with multiple chronic medical conditions including chronic diarrhea and paroxysmal atrial fibrillation presents with abdominal pain and diarrhea of five days duration. Patient recalls being in her usual state of health until five days ago when she felt "sick". This was characterized by nausea, vomiting, diarrhea and abdominal pain. The diarrhea was described as multiple small mucous watery stools with associated sharp, stabbing abdominal pain in the right lower quadrant that worsens with movement and palpation. She persisted with these symptoms for about two days and then presented to the Emergency Department where she was diagnosed with pancreatitis by lab work with a lipase of 806 and enteritis by CT abdomen. She was discharged home. Followed up with her primary care provider two days later for persistent symptoms, particularly persistent diarrhea and was sent to the Emergency Department for further evaluation. Repeat CAT scan of the abdomen shows persistent enteritis in the distal ileum. Patient denies melena, hematochezia, hematemesis. Patient denies any sick contacts at her assisted living community. She admits to a subjective fever on the day she presented to the Emergency Department a day after her symptoms.    While in the Emergency Department she was also noted to be in atrial fibrillation with rapid ventricular response, heart rate in the 130s. She was asymptomatic with this. She admits that due to her presenting symptoms she has been noncompliant with her medications over the last five days. She notes that she has had increased palpitations over the preceding days as well, however, denies any dizziness or lightheadedness. Patient denies any chest pain, any dyspnea. Denies orthopnea.   In  the Emergency Department she has been managed with IV fluids, IV Lopressor, antiemetics and she received ciprofloxacin and Flagyl.   PAST MEDICAL HISTORY: 1. Hospitalization for syncope in 2008. 2. Paroxysmal atrial fibrillation. 3. Coronary artery disease status post stent placement in 2007.  4. Hyperlipidemia. 5. Hypertension. 6. Mitral regurgitation.  7. Gastroesophageal reflux disease. 8. History of uterine cancer status post hysterectomy and adjuvant radiation.  9. Irritable bowel syndrome.  10. Chronic diarrhea.  11. History of adenomatous polyps.  12. Glaucoma.  13. History of appendicitis status post appendectomy.  14. Obstructive sleep apnea.  15. Fibrocystic breast disease.  16. Plantar fasciitis.   PAST SURGICAL HISTORY:  1. Status post cataract surgery. 2. Status post umbilical hernia repair. 3. Status post appendectomy. 4. Pacemaker placement.  5. Tonsillectomy.  6. Abdominal surgery for villous polyps.  7. Colonoscopy in 2008 showed internal nonbleeding hemorrhoids.   MEDICATIONS: 1. Acetaminophen/hydrocodone 325/5 mg 1 tablet q.6 hours as needed for pain. 2. Calcium carbonate with vitamin D 600 mg/400 international units 1 tablet daily. 3. Coumadin 1 mg nightly on Tuesday, Wednesday and Friday. 4. Coumadin 2 mg four times a week on Sunday, Monday, Thursday and Saturday. 5. Genteel ophthalmic solution 1 drop each eye daily.  6. Metoprolol succinate 25 mg extended release daily. 7. Pantoprazole 20 mg daily (patient says she takes this as needed). 8. Potassium chloride 10 mEq extended release twice a day. 9. Vitamin B12 1000 mcg/mL injectable solution 1 mL injected once a month. 10. Vitamin D3 1000 international units daily.  ALLERGIES: Amoxicillin which causes nausea and vomiting, Crestor reaction unknown, Pravachol reaction unknown, Vioxx, Zocor.   SOCIAL HISTORY: Lives at [facility] which is an  assisted living community, however, she is in the  independent living section. She is widowed with three children. 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 She is a former smoker, quit in 1967. Admits to occasional alcohol and denies illicit drug use.   FAMILY HISTORY: Notable for father with coronary artery disease, mother had abdominal cancer, unknown primary. Sister had cancer and congestive heart failure and another sister had Alzheimer's disease and congestive heart failure. There is also a family history of diabetes mellitus.   REVIEW OF SYSTEMS: CONSTITUTIONAL: Admits to mild subjective fever, one episode, three days ago which has not recurred. Denies fatigue, weight loss. EYES: Denies blurred vision, pain, redness or inflammation. NEUROLOGICAL: She denies numbness, weakness, dizziness, lightheadedness. ENT: Denies tinnitus, ear pain. RESPIRATORY: Admits to dry hacking cough that is chronic. Denies any shortness of breath, pleuritic chest pain. CARDIAC: Denies chest pain, orthopnea, edema. dyspnea on exertion. Admits to palpitations. Denies lightheadedness or dizziness. GASTROINTESTINAL: As noted in history of present illness, she admits to nausea, vomiting, diarrhea and abdominal pain. Denies hematemesis, melena, rectal bleeding. GENITOURINARY: Denies dysuria, hematuria, frequency. ENDOCRINE: Notes very intermittent nocturnal sweating. She attributes this to not turning up air in her home but no significant night sweats. MUSCULOSKELETAL: Admits to arthritis in her fingers. Denies other muscle aches. SKIN: No new skin lesions. PSYCH: Denies any changes in mood.  PHYSICAL EXAMINATION: VITAL SIGNS: Initial vitals on presentation: Temperature 99.6, heart rate 130, respirations 22, blood pressure 118/68, sating 97% on room air. Currently during my evaluation her systolic blood pressure is ranging between 97 to 638 with diastolics ranging from 50 to 52 and heart rate is currently 67.   GENERAL: Well appearing Caucasian female  in no apparent distress, laying comfortably on stretcher. Awake, alert, oriented x3.   HEENT: Normocephalic, atraumatic. Extraocular muscles are intact. Sclera is anicteric with pink conjunctiva. Pinpoint pupils.   NECK: Supple without any lymphadenopathy or thyromegaly.  CARDIAC: Normal S1, S2. Rate is controlled, currently rhythm is regular although on presentation it was irregularly, irregular. No murmurs appreciated.   PULMONARY: Lungs are clear to auscultation bilaterally without any wheezes, rales or rhonchi.  ABDOMEN: Distended with decreased bowel sounds. There is tenderness in the mid and right lower quadrants with some mild rebound tenderness. There is no guarding.   EXTREMITIES: No clubbing, cyanosis or edema. Full strength bilateral upper and lower extremities.   NEUROLOGICAL: Nonfocal.  SKIN: No skin rashes identified.   LABORATORY, DIAGNOSTIC AND RADIOLOGICAL DATA: BMP shows sodium 137, hypokalemia with a potassium of 3.1, chloride 100, bicarbonate 26, BUN 11 and creatinine 0.76 with glucose 151. Anion gap 11, calcium 8.1.  CBC shows leukocytosis at 14.1, hemoglobin 13, hematocrit 39.6, platelets 163 with MCV 93, neutrophils 10.9.   Lipase 55, decreased from a few days ago of 806.   LFTs show total bilirubin 1.8, alkaline phosphatase 68, ALT 15, AST 19, total protein 6.6, albumin 2.6.   Serum osmolality 276.  Magnesium 2.  INR 1.1, PT 14.6, PTT 32.7.  Urinalysis is negative for leukocyte esterase and nitrites. There is 2+ blood, 1+ ketones, 5 red blood cells and 2 WBCs.   CT abdomen shows right lower quadrant bowel wall thickening involving the distal ileum consistent with enteritis.   EKG shows atrial fibrillation with rapid ventricular response at 139 beats per minute.   Clostridium difficile is pending. Fecal leukocytes are pending as well.   ASSESSMENT AND PLAN: 81 year old female presenting with abdominal pain and  diarrhea now with resolved acute  pancreatitis, however, with persistent ileo enteritis, also found to be in atrial fibrillation with rapid ventricular response due to medication noncompliance as a result of the prior two diagnoses.  1. Abdominal pain and diarrhea due to ileum enteritis. Patient is status post ciprofloxacin and metronidazole in the Emergency Department, will continue those at this time. Will follow the Clostridium difficile cultures as well as fecal leukocytes. She will be placed on a clear liquid diet at this time as tolerated. It is unclear at this time if this is a viral or bacterial etiology. Will also check norovirus to rule out viral etiology although we don't have any history of a breakout at her facility.  2. Atrial fibrillation with rapid ventricular response. This is due to medication noncompliance as a result of the enteritis and pancreatitis that patient developed. At this time will continue her beta blocker. She is currently rate controlled and will check repeat EKG to assess if she is in sinus rhythm or persistent atrial fibrillation. Coumadin is subtherapeutic with an INR of 1.1. Will resume her Coumadin as well and with as discussed INR checks.  3. Hypertension, well controlled. Continue her home medications of metoprolol, triamterene and hydrochlorothiazide. 4. Gastroesophageal reflux disease. Stable on Protonix.  5. Hypokalemia. This is most likely as a result of chronic diarrhea. This will be repleted.  6. DVT prophylaxis. Lovenox.  7. Fluids, electrolytes, nutrition. She will be placed on normal saline and a clear liquid diet at this time.   Thank you for involving [place] in the care of this patient.   ____________________________ Samson Frederic, DO aeo:cms D: 12/02/2011 01:39:02 ET T: 12/02/2011 09:44:29 ET JOB#: [id number]  cc: Samson Frederic, DO, <Dictator> Idamae Coccia E Maizie Garno DO ELECTRONICALLY SIGNED 12/07/2011 5:18

## 2014-09-05 NOTE — Consult Note (Signed)
Chief Complaint:   Subjective/Chief Complaint Complaining of feeling sick with nausea. No bowel movements.   VITAL SIGNS/ANCILLARY NOTES: **Vital Signs.:   17-Jul-13 18:13   Vital Signs Type Q 4hr   Temperature Temperature (F) 97.5   Celsius 36.3   Temperature Source Oral   Pulse Pulse 60   Respirations Respirations 20   Systolic BP Systolic BP 311   Diastolic BP (mmHg) Diastolic BP (mmHg) 80   Mean BP 102   Pulse Ox % Pulse Ox % 93   Pulse Ox Activity Level  At rest   Oxygen Delivery Room Air/ 21 %   Brief Assessment:   Additional Physical Exam Abdomen is distended. Bowel sounds are infrequent. No significant tenderness except in RLQ area. No rebound.   Lab Results: Routine Coag:  17-Jul-13 05:37    Prothrombin  15.7   INR 1.2 (INR reference interval applies to patients on anticoagulant therapy. A single INR therapeutic range for coumarins is not optimal for all indications; however, the suggested range for most indications is 2.0 - 3.0. Exceptions to the INR Reference Range may include: Prosthetic heart valves, acute myocardial infarction, prevention of myocardial infarction, and combinations of aspirin and anticoagulant. The need for a higher or lower target INR must be assessed individually. Reference: The Pharmacology and Management of the Vitamin K  antagonists: the seventh ACCP Conference on Antithrombotic and Thrombolytic Therapy. [id number].4695 Sept:126 (3suppl): N9146842. A HCT value >55% may artifactually increase the PT.  In one study,  the increase was an average of 25%. Reference:  "Effect on Routine and Special Coagulation Testing Values of Citrate Anticoagulant Adjustment in Patients with High HCT Values." American Journal of Clinical Pathology 2006;126:400-405.)  Routine Hem:  17-Jul-13 14:52    WBC (CBC) 9.0   RBC (CBC)  3.45   Hemoglobin (CBC)  10.5   Hematocrit (CBC)  32.6   Platelet Count (CBC) 180   MCV 94   MCH 30.4   MCHC 32.2   RDW 13.3    Neutrophil % 77.1   Lymphocyte % 12.7   Monocyte % 9.9   Eosinophil % 0.2   Basophil % 0.1   Neutrophil #  6.9   Lymphocyte # 1.1   Monocyte # 0.9   Eosinophil # 0.0   Basophil # 0.0 (Result(s) reported on 03 Dec 2011 at 03:54PM.)   Assessment/Plan:  Assessment/Plan:   Assessment Enteritis as suggested by nausea, vomiting and diarrhea. Sudden disappearence of diarrhea is somewhat concerning for developing SBO. Leucocytosis has resolved. No fever. Stool sample not available.    Plan Abdominal x-rays. Surgical consult. Will follow.   Electronic Signatures: Jill Side (MD)  (Signed [phone] 18:22)  Authored: Chief Complaint, VITAL SIGNS/ANCILLARY NOTES, Brief Assessment, Lab Results, Assessment/Plan   Last Updated: 17-Jul-13 18:22 by Jill Side (MD)

## 2014-09-05 NOTE — Consult Note (Signed)
   Electronic Signatures: Manya Silvas (MD)  (Signed on 11-Aug-13 09:17)  Authored  Last Updated: 11-Aug-13 09:17 by Manya Silvas (MD)

## 2014-09-05 NOTE — Consult Note (Signed)
Chief complaint is abd pain due to enteritis of small bowel.  PT INR 1.7, pt on coumadin 1mg  a day.  will hold this in case of surgery tomorrow.  If Dr. Marina Gravel does proceed with surgery she should get vit K and maybe a unit of FFP or two.  Discussed with patient that she likely will not improve without surgery.  Electronic Signatures: Manya Silvas (MD)  (Signed on 13-Aug-13 10:24)  Authored  Last Updated: 13-Aug-13 10:24 by Manya Silvas (MD)

## 2014-09-05 NOTE — Consult Note (Signed)
Brief Consult Note: Diagnosis: Diarrhea.   Patient was seen by consultant.   Comments: Patient with diarrhea for 5 days which has now resolved. Positive RLQ tenderness with leucocytosis and CT showing ileal inflammation. History of A. fibb. Unremarkable colonoscopy in 2008.  Impression: Ileitis, most likely infectious vs inflammatory. Patient is tender in RLQ area although there is no rebound. positive leucocytosis.  Recommendations: Agree with IV antibiotics and clear liquids. IBD panel. Stool studies as soon as a sample is available. Will follow.  Electronic Signatures: Jill Side (MD)  (Signed 16-Jul-13 18:46)  Authored: Brief Consult Note   Last Updated: 16-Jul-13 18:46 by Jill Side (MD)

## 2014-09-05 NOTE — Consult Note (Signed)
    General Aspect this is an 81 year old female with history of paroxysmal atrial fibrillation on metoprolol for rate control and Coumadin to reduce risk of stroke.  Patient is currently in the hospital for GI complaints and was planned to have abdominal surgery this morning.  She presented preoperatively with atrial fibrillation.  RvR.  She did receive her 25 mg by mouth Metroprolol this a.m. as well as two additional IV doses of metoprolol 5 mg.  Despite this, she is still in A. fib around 110 bpm.  She is asymptomatic with this.  Usually with her breakthrough rhythms at home, she will be in atrial fibrillation for 12-36 hours.no chest pain or shortness of breath.  She is currently on Lovenox pending surgery.   Physical Exam:   GEN well developed, no acute distress    HEENT pink conjunctivae    RESP normal resp effort  clear BS    CARD Irregular rate and rhythm  No murmur    ABD positive tenderness  soft    EXTR negative edema    SKIN skin turgor decreased    NEURO cranial nerves intact, motor/sensory function intact    PSYCH alert, good insight   Review of Systems:   Subjective/Chief Complaint irregular heartbeat    General: Weakness    Skin: No Complaints    ENT: No Complaints    Eyes: No Complaints    Neck: No Complaints    Respiratory: No Complaints    Cardiovascular: irregular rhythm    Gastrointestinal: issues with nausea, diarrhea    Genitourinary: No Complaints    Vascular: No Complaints    Musculoskeletal: No Complaints    Neurologic: No Complaints    Endocrine: No Complaints    Psychiatric: No Complaints    Amoxicillin: N/V  Crestor: Unknown  Pravachol: Unknown  Zocor: Unknown  Vioxx: Unknown    Impression Paroxysmal atrial fibrillation with RVR this morning on presentation for abdominal surgery with surgery rescheduled pending control of atrial fibrillation.    Plan 1.  Place patient on telemetry for closer evaluation of rhythm and response  to medications. 2.  Continue the metoprolol 25 mg by mouth twice a day but add Cardizem 120 mg by mouth daily. 3.  Further adjustment of medication may be necessary if this does not control the heart rate between 60 and 100. 4.  Continue Lovenox shots for now with surgery pending but return to Coumadin when feasible.  Patient was seen in collaboration with Dr. Nehemiah Massed   Electronic Signatures: Roderic Palau (NP)  (Signed 15-Aug-13 15:43)  Authored: General Aspect/Present Illness, History and Physical Exam, Review of System, Allergies, Impression/Plan   Last Updated: 15-Aug-13 15:43 by Roderic Palau (NP)

## 2014-09-05 NOTE — Consult Note (Signed)
Pt feeling better today with less pain, had frequent vomiting last night with abd pain.  She has been in pain for a month and said she cannot go on living like this and is very agreeable to the surgery tomorrow.  PT elevated but Dr. Marina Gravel aware and is giving vit K for it.  I will sign off, reconsult if needed.  I agree with your plans.  Electronic Signatures: Manya Silvas (MD)  (Signed on 14-Aug-13 17:25)  Authored  Last Updated: 14-Aug-13 17:25 by Manya Silvas (MD)

## 2014-09-05 NOTE — Consult Note (Signed)
Given her good creatinine level and recurrent stomach problems I recommend you get a CT of abdomen WITH iv and oral contrast.  She likely has focal ischemic enteritis related to previous radiation therapy to the abdomen for previous uterine cancer, made worse by atherosclerosis.  The CT can also be reconstructed to give a CT angiogram if IV contrast is given.    Electronic Signatures: Manya Silvas (MD)  (Signed on 10-Aug-13 17:00)  Authored  Last Updated: 10-Aug-13 17:00 by Manya Silvas (MD)

## 2014-09-05 NOTE — H&P (Signed)
PATIENT NAME:  Kara [name], Kara [name] MR#:  [id number] DATE OF BIRTH:  [birth year]  DATE OF ADMISSION:  12/27/2011  PRIMARY CARE PHYSICIAN: Dr. Ronette Deter.   CHIEF COMPLAINT: Repetitive vomiting associated with nausea.   HISTORY OF PRESENT ILLNESS: [name] is an 81 year old pleasant Caucasian female who was admitted last month with abdominal pain associated with diarrhea. The workup was consistent with colitis and enteritis. The patient was seen by Gastroenterology as well. She was discharged home and was doing reasonably well until yesterday when she started to feel unwell, had some nausea so she stopped eating; and she resorted to Ensure for supplementation. However, she started vomiting. Since that point, she continued to have on and off vomiting all the afternoon associated with severe nausea but did not have any diarrhea this time. No hematochezia. No melena, no hematemesis. She also describes mild dull pain at the epigastric area described like bloating feeling to make her feel a little uncomfortable. The severity is mild. Her last bowel movement was yesterday morning. She has just a few little strings.  She reports no fever. The patient is now being admitted for further evaluation and treatment and to start IV hydration.   REVIEW OF SYSTEMS. CONSTITUTIONAL: Denies any fever. No chills. No fatigue. EYES: No blurring of vision. No double vision. ENT: No hearing impairment. No sore throat. No dysphagia. CARDIOVASCULAR: No chest pain. No shortness of breath. No syncope. RESPIRATORY: No shortness of breath. No cough. No sputum production. No chest pain. GASTROINTESTINAL: Reports nausea and repetitive vomiting, no diarrhea. She has mild epigastric abdominal pain. GENITOURINARY: No dysuria. No frequency of urination. MUSCULOSKELETAL: No joint pain or swelling. No muscular pain or swelling. INTEGUMENTARY: No skin rash. No ulcers. NEUROLOGY: No focal weakness. No seizure activity. No headache. PSYCHIATRY: No  anxiety. No depression. ENDOCRINE: No polyuria or polydipsia. No heat or cold intolerance.   PAST MEDICAL HISTORY:  1. Recent admission a month ago with nausea, vomiting, diarrhea, was secondary to colitis and enteritis.  2. History of paroxysmal atrial fibrillation, systemic hypertension, gastroesophageal reflux disease, hypokalemia, coronary artery disease status post stent placement in 2007. She also had pacemaker placement as well.  3. Hyperlipidemia. 4. Mitral regurgitation. 5. Irritable bowel syndrome. 6. Obstructive sleep apnea syndrome.  7. Plantar fasciitis. 8. Fibrocystic disease of the breasts. 9. History of uterine cancer, status post hysterectomy.   PAST SURGICAL HISTORY:  1. Appendectomy.  2. Cataract surgery. 3. Umbilical hernia repair. 4. Pacemaker placement. 5. Tonsillectomy.   SOCIAL HISTORY: The patient lives at the [facility] assisted living facility. She is widowed. She has 3 children. She is a retired Radio producer.   SOCIAL HABITS: Ex chronic smoker. She quit in 1967. She drinks alcohol only occasionally. No history of drug abuse.   FAMILY HISTORY: Her father suffered from coronary artery disease. Mother had abdominal cancer; the primary is unknown. She has a sister who has cancer and congestive heart failure and another sister who suffers from Alzheimer disease and congestive heart failure.   ADMISSION MEDICATIONS:  1. Coumadin recently adjusted to 1 mg a day pending further prothrombin time evaluation.  2. Potassium chloride 10 mEq twice a day. 3. Vitamin B12 once a month intramuscular injection.  4. Vitamin D3 one tablet once a day 1000 units.  5. Pantoprazole or Protonix 20 mg once a day. 6. Metoprolol 25 mg twice a day. 7. Maxzide 25 mg once a day. 8. GenTeal ophthalmic drops 1 drop in each eye once a day. 9. Flagyl  250 mg 4 times a day. 10. Ciprofloxacin or Cipro 500 mg twice a day.  11. Calcium with vitamin D once a day.  12. Acetaminophen  p.r.n.   ALLERGIES: Amoxicillin causing nausea and vomiting, Crestor, Pravachol, Zocor, and Vioxx. She has side effects.   PHYSICAL EXAMINATION:  VITAL SIGNS: Her blood pressure is 119/65, respiratory rate 18, pulse 67, temperature 98.1, oxygen saturation 90%.   GENERAL APPEARANCE: Elderly lady lying in bed in no acute distress.   HEAD: No pallor. No icterus. No cyanosis.  EAR, NOSE, THROAT: Hearing was normal. Nasal mucosa, lips, tongue were normal. Eye examination revealed normal iris and conjunctivae. Pupils are pinpointed. He could not detect reactivity to light.   NECK: Supple. Trachea at midline. No thyromegaly. No cervical lymphadenopathy. No masses.   HEART: Normal S1, S2. No S3, S4. No murmur. No gallop. No carotid bruits.   RESPIRATORY: Normal breathing pattern without use of accessory muscles. No rales. No wheezing.   ABDOMEN: Soft without tenderness. No hepatosplenomegaly. No masses. No hernias.   SKIN: No ulcers. No subcutaneous nodules.   MUSCULOSKELETAL: No joint swelling. No clubbing.   NEUROLOGICAL: Cranial nerves II through XII are intact. No focal motor deficit.   PSYCHIATRIC: The patient is alert, oriented x3. Mood and affect were normal.   LABORATORY FINDINGS: CAT scan of the abdomen showed persistent appearance of thickening in the wall of a loop of small bowel in the right pelvis and also midabdomen with some adjacent inflammatory stranding. There are some mildly prominent loops of small bowel with air fluid levels present without a definite obstruction. Ileus is not excluded. Serum glucose 196, BUN 21, creatinine 1.09, sodium 136, potassium 3.5. Liver function tests were normal with a slight elevation of bilirubin up to 1.4. CBC showed white count of 16,000, hemoglobin 15, hematocrit 45, platelet count 271,000. Urinalysis showed cloudy urine, but has only 6 white blood cells.   ASSESSMENT:  1. Mild abdominal pain associated with nausea and repetitive vomiting,  likely secondary to gastroenteritis.  2. Mild dehydration.  3. Paroxysmal atrial fibrillation. 4. Recent admission with colitis and enteritis and residual finding by CAT scan of small bowel wall thickening and some surrounding inflammatory stranding.  5. Her other medical problems include hypertension, hypokalemia, hyperlipidemia, irritable bowel syndrome, gastroesophageal reflux disease, glaucoma, obstructive sleep apnea, and history of uterine cancer, status post hysterectomy.  6. History of appendectomy and pacemaker insertion.   PLAN: We will admit the patient to the medical floor. Start IV hydration with normal saline. I will change Protonix from 20 mg orally to 40 mg intravenous twice a day. I will hold the oral ciprofloxacin and Flagyl. Also we will hold the Maxzide. The oral potassium supplementation, calcium, and vitamin D will be on hold as well. I will continue IV antibiotics. We are hoping that this is self-limited gastroenteritis. Otherwise it may be extension of her initial problem of last month of enteritis or progression of the problem. We will monitor and follow up on this issue. I would like to check her prothrombin time and INR since there is recent adjustment in her Coumadin, which was dropped down to 1 mg daily. I will place her on a clear-liquid if she can tolerate, otherwise she will stay on intravenous fluids only. If there is no meaningful improvement obviously at this point, we may need to reconsult gastroenterology again.   CODE STATUS: I SPOKE WITH THE PATIENT REGARDING A LIVING WILL. SHE INDICATES THAT SHE HAS A LIVING WILL, AND  SHE ALSO APPOINTED HER DAUGHTER TO BE THE PERSON TO HAVE THE POWER OF ATTORNEY. HER DAUGHTER'S NAME IS ALICIA STEVENS.            TIME SPENT EVALUATING THIS PATIENT: More than 55 minutes.     ____________________________ Clovis Pu. Lenore Manner, MD amd:vtd D: 12/27/2011 00:31:56 ET T: 12/27/2011 06:58:17 ET JOB#: [id number]  cc: Clovis Pu. Lenore Manner,  MD, <Dictator> Eduard Clos. Gilford Rile, MD Clovis Pu Hadar MD ELECTRONICALLY SIGNED 12/27/2011 22:40

## 2014-09-05 NOTE — Consult Note (Signed)
Chief Complaint:   Subjective/Chief Complaint feeling better after eating some solid food.  no n/v, stool forming?   VITAL SIGNS/ANCILLARY NOTES: **Vital Signs.:   21-Jul-13 13:36   Vital Signs Type Routine   Temperature Temperature (F) 98.2   Celsius 36.7   Temperature Source oral   Pulse Pulse 110   Respirations Respirations 20   Systolic BP Systolic BP 301   Diastolic BP (mmHg) Diastolic BP (mmHg) 80   Mean BP 102   Pulse Ox % Pulse Ox % 90   Pulse Ox Activity Level  At rest   Oxygen Delivery Room Air/ 21 %  *Intake and Output.:   21-Jul-13 09:05   Stool  med soft    09:50   Stool  med loose    10:20   Stool  med loose    11:50   Stool  small loose stool    13:36   Stool  small loose   Brief Assessment:   Cardiac Irregular    Respiratory clear BS    Gastrointestinal details normal Soft  Nondistended  No masses palpable  Bowel sounds normal  No rebound tenderness  minimal tenderness in the rlq   Lab Results: Routine Chem:  21-Jul-13 04:56    Glucose, Serum  122   BUN  6   Creatinine (comp) 0.72   Sodium, Serum 143   Potassium, Serum 3.6   Chloride, Serum 104   CO2, Serum 30   Calcium (Total), Serum  8.1   Anion Gap 9   Osmolality (calc) 284   eGFR (African American) >60   eGFR (Non-African American) >60 (eGFR values <42m/min/1.73 m2 may be an indication of chronic kidney disease (CKD). Calculated eGFR is useful in patients with stable renal function. The eGFR calculation will not be reliable in acutely ill patients when serum creatinine is changing rapidly. It is not useful in  patients on dialysis. The eGFR calculation may not be applicable to patients at the low and high extremes of body sizes, pregnant women, and vegetarians.)   Result Comment labs - This specimen was collected through an   - indwelling catheter or arterial line.  - A minimum of 559m of blood was wasted prior    - to collecting the sample.  Interpret  - results with caution.   Result(s) reported on 07 Dec 2011 at 06:01AM.  Routine Coag:  21-Jul-13 07:57    Prothrombin  29.1   INR 2.7 (INR reference interval applies to patients on anticoagulant therapy. A single INR therapeutic range for coumarins is not optimal for all indications; however, the suggested range for most indications is 2.0 - 3.0. Exceptions to the INR Reference Range may include: Prosthetic heart valves, acute myocardial infarction, prevention of myocardial infarction, and combinations of aspirin and anticoagulant. The need for a higher or lower target INR must be assessed individually. Reference: The Pharmacology and Management of the Vitamin K  antagonists: the seventh ACCP Conference on Antithrombotic and Thrombolytic Therapy. [id number].3235ept:126 (3suppl): [id number] HCT value >55% may artifactually increase the PT.  In one study,  the increase was an average of 25%. Reference:  "Effect on Routine and Special Coagulation Testing Values of Citrate Anticoagulant Adjustment in Patients with High HCT Values." American Journal of Clinical Pathology 2006;126:400-405.)  Routine Hem:  21-Jul-13 04:56    Hemoglobin (CBC)  11.4   Assessment/Plan:  Assessment/Plan:   Assessment 1) ileitis.enteritis/abdominal pain.  patietn improving on abx.    Plan 1) agree with change to  po abx.  will need o/p GI followup (Dr Dionne Milo). Continue current.   Electronic Signatures: Loistine Simas (MD)  (Signed 21-Jul-13 17:46)  Authored: Chief Complaint, VITAL SIGNS/ANCILLARY NOTES, Brief Assessment, Lab Results, Assessment/Plan   Last Updated: 21-Jul-13 17:46 by Loistine Simas (MD)

## 2014-09-05 NOTE — Consult Note (Signed)
Chief Complaint:   Subjective/Chief Complaint Feels somewhat better and wants diet advanced. Stool  C. diff negative.   VITAL SIGNS/ANCILLARY NOTES: **Vital Signs.:   18-Jul-13 18:08   Vital Signs Type Q 4hr   Temperature Temperature (F) 98   Celsius 36.6   Temperature Source Oral   Pulse Pulse 63   Respirations Respirations 18   Systolic BP Systolic BP 983   Diastolic BP (mmHg) Diastolic BP (mmHg) 74   Mean BP 99   Pulse Ox % Pulse Ox % 93   Pulse Ox Activity Level  At rest   Oxygen Delivery 1L; Nasal Cannula   Brief Assessment:   Additional Physical Exam Still with RLQ tenderness without rebound. Otherwise benign abdomen.   Lab Results: Routine Micro:  18-Jul-13 11:12    Micro Text Report CLOS.DIFF ASSAY, RT-PCR   COMMENT                   NEGATIVE-CLOS.DIFFICILE TOXIN NOT DETECTED BY PCR   ANTIBIOTIC                        Clostridium Diff Toxin by RT-PCR NEGATIVE-CLOS.DIFFICILE TOXIN NOT DETECTED BY PCR ---------------------------------- Test procedure integrates sample purification, nucleic acid amplification, and detection of the target Clostridium difficile sequence in simple or complex samplesusing real-time PCR and RT-PCR assays.  Routine Chem:  18-Jul-13 05:19    Result Comment labs - This specimen was collected through an   - indwelling catheter or arterial line.  - A minimum of 29mls of blood was wasted prior    - to collecting the sample.  Interpret  - results with caution.  Result(s) reported on 04 Dec 2011 at 06:17AM.  Routine Sero:  18-Jul-13 11:12    Occult Blood, Feces NEGATIVE (Result(s) reported on 04 Dec 2011 at 01:25PM.)  Routine Coag:  18-Jul-13 05:19    Prothrombin  25.0   INR 2.2 (INR reference interval applies to patients on anticoagulant therapy. A single INR therapeutic range for coumarins is not optimal for all indications; however, the suggested range for most indications is 2.0 - 3.0. Exceptions to the INR Reference Range may  include: Prosthetic heart valves, acute myocardial infarction, prevention of myocardial infarction, and combinations of aspirin and anticoagulant. The need for a higher or lower target INR must be assessed individually. Reference: The Pharmacology and Management of the Vitamin K  antagonists: the seventh ACCP Conference on Antithrombotic and Thrombolytic Therapy. JASNK.5397 Sept:126 (3suppl): [id number]. A HCT value >55% may artifactually increase the PT.  In one study,  the increase was an average of 25%. Reference:  "Effect on Routine and Special Coagulation Testing Values of Citrate Anticoagulant Adjustment in Patients with High HCT Values." American Journal of Clinical Pathology 2006;126:400-405.)   Assessment/Plan:  Assessment/Plan:   Assessment Ileitis, ? etiology is not clear. IBD panel pending. Nausea, may be in part secondary to antibioics.    Plan Full liquid diet. Agree with changing antibiotics. Discussed with Dr. Posey Pronto. Dr. Candace Cruise will see her tomorrow and Dr. Donnella Sham will follow her over the weekend.   Electronic Signatures: Jill Side (MD)  (Signed [phone] 20:21)  Authored: Chief Complaint, VITAL SIGNS/ANCILLARY NOTES, Brief Assessment, Lab Results, Assessment/Plan   Last Updated: 18-Jul-13 20:21 by Jill Side (MD)

## 2014-09-05 NOTE — Consult Note (Signed)
PATIENT NAME:  Kara [name], Kara [name] MR#:  [id number] DATE OF BIRTH:  10-20-1922  DATE OF CONSULTATION:  12/03/2011  REFERRING PHYSICIAN:   CONSULTING PHYSICIAN:  Georga Stys A. Marina Gravel, MD  REASON FOR CONSULTATION: Possible small bowel obstruction and regional enteritis.   HISTORY: This is an 81 year old white female with a history of uterine carcinoma status post hysterectomy and pelvic radiation as well as a history of remote appendectomy and right colectomy a number of years ago for a large benign right colon polyp. The patient also has a history of chronic atrial fibrillation for which he has been on long-standing Coumadin therapy. The patient lives at a rest home. One week ago this past Thursday the patient had the rather sudden onset of right lower quadrant and left lower quadrant abdominal pain preceded by mild nausea and several episodes of vomiting. She has a history of chronic diarrhea of unclear etiology. Her last bowel movement was several days ago, last passage of gas was this morning. The patient has had no further nausea and vomiting while in the hospital. She did go to the Emergency Room a week ago and was discharged without specific therapy. She then was seen by Dr. Derrel Nip who referred her to the Emergency Room for admission. Since her admission she has been seen by gastroenterology. Two CT scans have been obtained. I personally reviewed both. There is findings of small bowel enteritis in the area of the right lower quadrant corresponding to the area of pain. The most recent of which dated July 16 demonstrates mesenteric stranding and a small amount of free fluid. There are no signs of bowel obstruction. In fact plain films obtained this morning of the abdomen demonstrates air seen throughout the entire colon. There were multiple surgical clips in the right lower quadrant corresponding to the previous right colectomy done a number of years ago as described above. The patient has been seen by gastroenterology  in addition who recommended surgical consultation. The small bowel appearance does not have the typical appearance for Crohn's. She would be of an extreme age to have this as initial diagnosis at this time as well.   ALLERGIES: Amoxicillin, Crestor, Pravachol, Vioxx and Zocor.   MEDICATIONS AT HOME:  1. Coumadin.  2. Acetaminophen hydrocodone. 3. Calcarb. 4. GenTeal. 5. Maxzide. 6. Metoprolol. 7. Protonix. 8. Potassium chloride.  HOSPITALIZATION MEDICATIONS:  1. Intravenous fluids. 2. Norco. 3. Calcium carbonate. 4. Cipro IV. 5. Lovenox 60 mg subcutaneous daily. 6. Hydrochlorothiazide triamterene once a day. 7. Dilaudid 0.5 mg every four hours as needed.  8. Toprol-XL 25 mg by mouth daily. 9. Flagyl injection 500 mg intravenously every eight hours.  10. Zofran. 11. Protonix.  12. Potassium extended-release tablet.   PAST MEDICAL HISTORY:  1. Atrial fibrillation on Coumadin. 2. Obesity. 3. Hypertension. 4. Uterine carcinoma. 5. Coronary artery disease, status post stent placement 2007. 6. History of syncope 2008. 7. Hyperlipidemia. 8. History of mitral regurgitation. 9. Gastroesophageal reflux disease. 10. Irritable bowel syndrome. 11. Chronic diarrhea. 12. History of colonic adenomatous polyps. 13. Glaucoma. 14. Obstructive sleep apnea. 15. Fibrocystic breast disease.   PAST SURGICAL HISTORY: 1. Repair of umbilical hernia. 2. History of appendectomy. 3. Pacemaker placement.  4. Tonsillectomy.  5. Right colectomy. 6. Colonoscopy. 7. Cataract surgery.   SOCIAL HISTORY: She is widowed. Retired Radio producer. Quit smoking 1967. Denies heavy alcohol use in the past.   FAMILY HISTORY: Significant for coronary artery disease, congestive heart failure, Alzheimer's disease.   REVIEW OF SYSTEMS: As described above and  otherwise unremarkable.   PHYSICAL EXAMINATION:  GENERAL: Temperature 98.4, pulse 60, irregularly irregular, respiratory rate 20, blood pressure  139/64, O2 saturation at rest on 1 liter is 90%.   NECK: Supple. No adenopathy.   LUNGS: Clear bilaterally.   HEART: Irregularly irregular.   ABDOMEN: Mildly distended. There are multiple scars. No obvious hernias. There is a subcutaneous nodule in the lower midline which is nontender. Negative Rovsing's sign. She has moderate tenderness to mild palpation in the right lower quadrant.   RECTAL/GENITOURINARY: Deferred.   NEUROLOGIC/PSYCHIATRIC: Grossly unremarkable.   LABORATORY, DIAGNOSTIC AND RADIOLOGICAL DATA: Admission white count 13.8, 15 yesterday, 9.0 this morning. Hemoglobin 13.8 on admission, 10.5 this morning. Admission platelet count 171,001, 180,000 this morning. Total bilirubin 1.8, alkaline phosphatase 68, AST 19, ALT 15. Admission lipase 802 which normalized to 55 on the 15th. Electrolytes are unremarkable. CT scans are as described above.   IMPRESSION: This is an 81 year old white female with what looks like regional enteritis, infection or inflammatory being in the differential diagnoses, certainly nonocclusive mesenteric ischemia which seems to be resolving as the patient states that she is feeling better but unable to eat at this time.   RECOMMENDATIONS: I would agree with continued intravenous hydration, IV antibiotics as described above and serial abdominal examinations. I suspect that this will resolve without surgical intervention. The patient is at rather high risk for any type of surgical intervention given her age and other medical comorbidities. I will follow with you while in the hospital.  ____________________________ Jeannette How. Marina Gravel, MD mab:cms D: 12/03/2011 21:29:52 ET T: 12/04/2011 10:02:00 ET JOB#: [id number]  cc: Elta Guadeloupe A. Marina Gravel, MD, <Dictator> Eduard Clos. Gilford Rile, MD Deborra Medina, MD Clovis Bettina Gavia MD ELECTRONICALLY SIGNED 12/05/2011 12:34

## 2014-09-05 NOTE — Consult Note (Signed)
Brief Consult Note: Diagnosis: Enteritis ddx infectious vs inflammatory vs. non-occlusive mesenteric ischemia.   Patient was seen by consultant.   Consult note dictated.   Recommend further assessment or treatment.   Comments: agree with hydration empiric antibiotics serial exams,  surgical intervention not indicated yet as patient states she is feeling better although unable to tolerate po as of this time.  Electronic Signatures: Sherri Rad (MD)  (Signed 17-Jul-13 20:46)  Authored: Brief Consult Note   Last Updated: 17-Jul-13 20:46 by Sherri Rad (MD)

## 2014-09-05 NOTE — Consult Note (Signed)
Chief Complaint:   Subjective/Chief Complaint Covering for Dr. Suzette Battiest. Mutiple BM's today. Some LLQ pain. Felt good this AM but feels worse now. Some nausea. Started clears.   VITAL SIGNS/ANCILLARY NOTES: **Vital Signs.:   19-Jul-13 11:26   Vital Signs Type Q 4hr   Temperature Temperature (F) 98.2   Celsius 36.7   Pulse Pulse 107   Respirations Respirations 18   Systolic BP Systolic BP 536   Diastolic BP (mmHg) Diastolic BP (mmHg) 76   Mean BP 91   Pulse Ox % Pulse Ox % 92   Pulse Ox Activity Level  At rest   Oxygen Delivery 1L    12:06   Pulse Ox % Pulse Ox % 92   Oxygen Delivery 1L   Brief Assessment:   Cardiac Regular    Respiratory clear BS    Gastrointestinal LLQ tenderness?   Lab Results: Routine Chem:  19-Jul-13 05:07    Result Comment labs - This specimen was collected through an   - indwelling catheter or arterial line.  - A minimum of 61mls of blood was wasted prior    - to collecting the sample.  Interpret  - results with caution.  Result(s) reported on 05 Dec 2011 at 05:51AM.  Routine Coag:  19-Jul-13 05:07    Prothrombin  32.4   INR 3.2 (INR reference interval applies to patients on anticoagulant therapy. A single INR therapeutic range for coumarins is not optimal for all indications; however, the suggested range for most indications is 2.0 - 3.0. Exceptions to the INR Reference Range may include: Prosthetic heart valves, acute myocardial infarction, prevention of myocardial infarction, and combinations of aspirin and anticoagulant. The need for a higher or lower target INR must be assessed individually. Reference: The Pharmacology and Management of the Vitamin K  antagonists: the seventh ACCP Conference on Antithrombotic and Thrombolytic Therapy. RWERX.5400 Sept:126 (3suppl): [id number]. A HCT value >55% may artifactually increase the PT.  In one study,  the increase was an average of 25%. Reference:  "Effect on Routine and Special Coagulation  Testing Values of Citrate Anticoagulant Adjustment in Patients with High HCT Values." American Journal of Clinical Pathology 2006;126:400-405.)   Radiology Results: XRay:    17-Jul-13 19:04, Abdomen 3 Way Includes PA Chest   Abdomen 3 Way Includes PA Chest    REASON FOR EXAM:    Abdominal pain.  COMMENTS:       PROCEDURE: DXR - DXR ABDOMEN 3-WAY (INCL PA CXR)  - Dec 03 2011  7:04PM     RESULT: Comparison: 12/01/2011    Findings:  Right PICC catheter tip is difficult to visualize, but seen at least to   thelevel of the cavoatrial junction. Slight increased density at the   lung bases likely secondary to overlying soft tissue and mild atelectasis.    No free intraperitoneal air. Air is seen within nondilated small and   large bowel. Surgical clips and bowel suture line are seen in the right   hemiabdomen. There is some residual oral contrast material in the region     of the hepatic flexure.    IMPRESSION:   Nonobstructed bowel gas pattern.      Dictation Site: 8          Verified By: Gregor Hams, M.D., MD   Assessment/Plan:  Assessment/Plan:   Assessment Ileus/enteritis.    Plan Continue Abx for now. Dr. Gustavo Lah to see patient over the weekend. T/C colonoscopy next week if sxs do not improve significantly. Keep  on clears rest of today. Thanks.   Electronic Signatures: Verdie Shire (MD)  (Signed 19-Jul-13 13:11)  Authored: Chief Complaint, VITAL SIGNS/ANCILLARY NOTES, Brief Assessment, Lab Results, Radiology Results, Assessment/Plan   Last Updated: 19-Jul-13 13:11 by Verdie Shire (MD)

## 2014-09-11 LAB — PROTIME-INR

## 2014-09-15 ENCOUNTER — Encounter: Payer: Self-pay | Admitting: Internal Medicine

## 2014-09-15 ENCOUNTER — Other Ambulatory Visit: Payer: Self-pay | Admitting: Internal Medicine

## 2014-09-15 ENCOUNTER — Ambulatory Visit (INDEPENDENT_AMBULATORY_CARE_PROVIDER_SITE_OTHER): Payer: Medicare Other | Admitting: Internal Medicine

## 2014-09-15 VITALS — BP 131/74 | HR 74 | Temp 98.3°F | Ht 61.5 in | Wt 132.0 lb

## 2014-09-15 DIAGNOSIS — K649 Unspecified hemorrhoids: Secondary | ICD-10-CM

## 2014-09-15 DIAGNOSIS — R197 Diarrhea, unspecified: Secondary | ICD-10-CM | POA: Diagnosis not present

## 2014-09-15 DIAGNOSIS — R609 Edema, unspecified: Secondary | ICD-10-CM | POA: Diagnosis not present

## 2014-09-15 DIAGNOSIS — K529 Noninfective gastroenteritis and colitis, unspecified: Secondary | ICD-10-CM | POA: Diagnosis not present

## 2014-09-15 DIAGNOSIS — N390 Urinary tract infection, site not specified: Secondary | ICD-10-CM

## 2014-09-15 DIAGNOSIS — L299 Pruritus, unspecified: Secondary | ICD-10-CM | POA: Diagnosis not present

## 2014-09-15 DIAGNOSIS — M1990 Unspecified osteoarthritis, unspecified site: Secondary | ICD-10-CM | POA: Diagnosis not present

## 2014-09-15 LAB — POCT URINALYSIS DIPSTICK
Bilirubin, UA: NEGATIVE
Glucose, UA: NEGATIVE
Ketones, UA: NEGATIVE
Nitrite, UA: NEGATIVE
Protein, UA: NEGATIVE
Spec Grav, UA: <=1.005
Urobilinogen, UA: 0.2
pH, UA: 6

## 2014-09-15 LAB — COMPREHENSIVE METABOLIC PANEL
ALT: 18 U/L (ref 0–35)
AST: 23 U/L (ref 0–37)
Albumin: 3.6 g/dL (ref 3.5–5.2)
Alkaline Phosphatase: 70 U/L (ref 39–117)
BUN: 17 mg/dL (ref 6–23)
CO2: 24 mEq/L (ref 19–32)
Calcium: 8.7 mg/dL (ref 8.4–10.5)
Chloride: 103 mEq/L (ref 96–112)
Creat: 0.82 mg/dL (ref 0.50–1.10)
Glucose, Bld: 143 mg/dL — ABNORMAL HIGH (ref 70–99)
Potassium: 3.4 mEq/L — ABNORMAL LOW (ref 3.5–5.3)
Sodium: 139 mEq/L (ref 135–145)
Total Bilirubin: 1.1 mg/dL (ref 0.2–1.2)
Total Protein: 6.7 g/dL (ref 6.0–8.3)

## 2014-09-15 MED ORDER — TRIAMCINOLONE ACETONIDE 0.1 % EX CREA: 1.0000 "application " | TOPICAL_CREAM | Freq: Two times a day (BID) | CUTANEOUS | Status: DC

## 2014-09-15 NOTE — Telephone Encounter (Signed)
Please address during pts visit today or advise refills.

## 2014-09-15 NOTE — Progress Notes (Signed)
Pre visit review using our clinic review tool, if applicable. No additional management support is needed unless otherwise documented below in the visit note. 

## 2014-09-15 NOTE — Addendum Note (Signed)
Addended by: Karlene Einstein D on: 09/15/2014 03:36 PM   Modules accepted: Orders

## 2014-09-15 NOTE — Assessment & Plan Note (Signed)
C/w CVI. Keep legs elevated. Limit salt intake. Start low strength compression stockings.

## 2014-09-15 NOTE — Patient Instructions (Addendum)
Try using triamcinolone cream topically to skin for itching.  Keep legs elevated during day. Limit salt intake.  Follow up in 4 weeks.

## 2014-09-15 NOTE — Assessment & Plan Note (Signed)
Continue Analpram.

## 2014-09-15 NOTE — Addendum Note (Signed)
Addended by: Karlene Einstein D on: 09/15/2014 03:12 PM   Modules accepted: Orders

## 2014-09-15 NOTE — Assessment & Plan Note (Signed)
Dermatitis. Start topical triamcinolone to area on back and abdomen with pruritis.

## 2014-09-15 NOTE — Assessment & Plan Note (Signed)
Avoid fried food which trigger chronic diarrhea. Continue prn Lomotil.

## 2014-09-15 NOTE — Assessment & Plan Note (Signed)
Chronic intermittent pain. Will continue prn Tylenol. Follow up if pain is worsening. Discussed referral to ortho or rheum for steroid injection.

## 2014-09-15 NOTE — Progress Notes (Signed)
Subjective:    Patient ID: Kara [name], female    DOB: 24-Feb-1923, 81 y.o.   MRN: [id number]  HPI  81YO female presents for follow up.  Itching - Having some itching across lower abdomen. No rash. Seen by urology and they recommended Benadryl. Pharmacist recommended Zyrtec, but this also caused drowsiness.   Diarrhea was much worse for a few days recently, up to 18 episodes per day. Seemed to be triggered by fried flounder. Lasted 3 days then improved. Used Lomotil with improvement.  Hemorrhoids - having issues with hemorrhoids after episode of diarrhea. Using Anal Pram with some improvement.  OA - Joints in right hand have been aching. Has not taken anything for this. Aching pain occurs off and on.   Edema - Feet and ankles have been swelling more than previous. Not recently wearing compression hose because difficult to get off and on.  Past medical, surgical, family and social history per today's encounter.  Review of Systems  Constitutional: Negative for fever, chills, appetite change, fatigue and unexpected weight change.  Eyes: Negative for visual disturbance.  Respiratory: Negative for shortness of breath.   Cardiovascular: Negative for chest pain and leg swelling.  Gastrointestinal: Positive for diarrhea, anal bleeding and rectal pain. Negative for nausea, vomiting, abdominal pain, constipation and blood in stool.  Musculoskeletal: Positive for myalgias, joint swelling and arthralgias.  Skin: Negative for color change, pallor and rash.  Hematological: Negative for adenopathy. Does not bruise/bleed easily.  Psychiatric/Behavioral: Negative for sleep disturbance and dysphoric mood. The patient is not nervous/anxious.        Objective:    BP 131/74 mmHg  Pulse 74  Temp(Src) 98.3 F (36.8 C) (Oral)  Ht 5' 1.5" (1.562 m)  Wt 132 lb (59.875 kg)  BMI 24.54 kg/m2  SpO2 96% Physical Exam  Constitutional: She is oriented to person, place, and time. She appears  well-developed and well-nourished. No distress.  HENT:  Head: Normocephalic and atraumatic.  Right Ear: External ear normal.  Left Ear: External ear normal.  Nose: Nose normal.  Mouth/Throat: Oropharynx is clear and moist. No oropharyngeal exudate.  Eyes: Conjunctivae are normal. Pupils are equal, round, and reactive to light. Right eye exhibits no discharge. Left eye exhibits no discharge. No scleral icterus.  Neck: Normal range of motion. Neck supple. No tracheal deviation present. No thyromegaly present.  Cardiovascular: Normal rate, regular rhythm, normal heart sounds and intact distal pulses.  Exam reveals no gallop and no friction rub.   No murmur heard. Pulmonary/Chest: Effort normal and breath sounds normal. No respiratory distress. She has no wheezes. She has no rales. She exhibits no tenderness.  Musculoskeletal: Normal range of motion. She exhibits edema (pitting to below knee). She exhibits no tenderness.       Hands: Lymphadenopathy:    She has no cervical adenopathy.  Neurological: She is alert and oriented to person, place, and time. No cranial nerve deficit. She exhibits normal muscle tone. Coordination normal.  Skin: Skin is warm and dry. No rash noted. She is not diaphoretic. No erythema. No pallor.     Psychiatric: She has a normal mood and affect. Her behavior is normal. Judgment and thought content normal.          Assessment & Plan:   Problem List Items Addressed This Visit      Unprioritized   Chronic diarrhea    Avoid fried food which trigger chronic diarrhea. Continue prn Lomotil.      Relevant Orders  Comprehensive metabolic panel   Edema    C/w CVI. Keep legs elevated. Limit salt intake. Start low strength compression stockings.      Hemorrhoids - Primary    Continue Analpram.      Itching    Dermatitis. Start topical triamcinolone to area on back and abdomen with pruritis.       Osteoarthritis    Chronic intermittent pain. Will continue  prn Tylenol. Follow up if pain is worsening. Discussed referral to ortho or rheum for steroid injection.          Return in about 4 weeks (around 10/13/2014) for Recheck.

## 2014-09-17 LAB — URINE CULTURE: Colony Count: 75000

## 2014-09-17 NOTE — Op Note (Signed)
PATIENT NAME:  Kara [name], Kara [name] MR#:  [id number] DATE OF BIRTH:  [birth year]  DATE OF PROCEDURE:  05/30/2014  PRIMARY CARE PHYSICIAN:  Dr. Ronette Deter.   PREPROCEDURE DIAGNOSIS: Sick sinus syndrome.   PROCEDURE: Dual-chamber pacemaker generator change out.   POSTPROCEDURE DIAGNOSIS: Intermittent ventricular pacing.   INDICATION: The patient is an 81 year old female status post dual-chamber pacemaker for sick sinus syndrome. Recent pacemaker interrogation was performed which revealed that the pacemaker generator was at elective replacement indication.   DESCRIPTION OF PROCEDURE: The risks, benefits, and alternatives of pacemaker generator change out were explained to the patient and informed written consent was obtained. She was brought to the operating room in a fasting state. The left pectoral region was prepped and draped in the usual sterile manner. Anesthesia was obtained with 1% Xylocaine locally. A 6 cm incision was performed over the left pectoral region. The old pacemaker generator was retrieved by electrocautery and blunt dissection. The leads were disconnected and connected to a new dual-chamber rate responsive pacemaker generator ([id number] [id number]).  The pacemaker pocket was irrigated with gentamicin solution. The new pacemaker generator was positioned in the pocket. The pocket was closed with 2-0 and 4-0 Vicryl, respectively. Steri-Strips and pressure dressing were applied.      ____________________________ Isaias Cowman, MD ap:bu D: 05/30/2014 13:15:34 ET T: 05/30/2014 13:25:37 ET JOB#: [id number]  cc: Isaias Cowman, MD, <Dictator> Isaias Cowman MD ELECTRONICALLY SIGNED 06/07/2014 18:06

## 2014-09-18 ENCOUNTER — Encounter: Payer: Self-pay | Admitting: Internal Medicine

## 2014-09-19 LAB — PROTIME-INR

## 2014-09-20 ENCOUNTER — Other Ambulatory Visit: Payer: Medicare Other

## 2014-09-20 DIAGNOSIS — Z139 Encounter for screening, unspecified: Secondary | ICD-10-CM | POA: Diagnosis not present

## 2014-09-20 DIAGNOSIS — N390 Urinary tract infection, site not specified: Secondary | ICD-10-CM | POA: Diagnosis not present

## 2014-09-23 LAB — CULTURE, URINE COMPREHENSIVE: Colony Count: >=100000

## 2014-09-25 ENCOUNTER — Other Ambulatory Visit: Payer: Self-pay | Admitting: *Deleted

## 2014-09-25 MED ORDER — CEPHALEXIN 500 MG PO CAPS: 500.0000 mg | ORAL_CAPSULE | Freq: Three times a day (TID) | ORAL | Status: DC

## 2014-09-27 ENCOUNTER — Encounter: Payer: Self-pay | Admitting: *Deleted

## 2014-09-27 ENCOUNTER — Encounter: Payer: Self-pay | Admitting: Internal Medicine

## 2014-09-27 LAB — POCT INR: INR: 2.2 — AB (ref 0.9–1.1)

## 2014-09-28 ENCOUNTER — Encounter: Payer: Self-pay | Admitting: Internal Medicine

## 2014-10-02 ENCOUNTER — Telehealth: Payer: Self-pay

## 2014-10-02 ENCOUNTER — Other Ambulatory Visit: Payer: Self-pay | Admitting: *Deleted

## 2014-10-02 MED ORDER — FLUCONAZOLE 150 MG PO TABS: 150.0000 mg | ORAL_TABLET | Freq: Once | ORAL | Status: DC

## 2014-10-02 NOTE — Telephone Encounter (Signed)
Rx sent to pharmacy, left vm notifying pt

## 2014-10-02 NOTE — Telephone Encounter (Signed)
The patient called and stated she needs an rx for a yeast infection due to taking antibiotics.  She stated she develops a yeast infection with every round of antibiotics she takes.   [facility] - [facility] [address]  Pt callback [phone]

## 2014-10-02 NOTE — Telephone Encounter (Signed)
Fine to call in Diflucan 150mg  po x1

## 2014-10-03 DIAGNOSIS — I4891 Unspecified atrial fibrillation: Secondary | ICD-10-CM | POA: Diagnosis not present

## 2014-10-03 LAB — PROTIME-INR: INR: 2.5 — AB (ref 0.9–1.1)

## 2014-10-04 ENCOUNTER — Encounter: Payer: Self-pay | Admitting: Internal Medicine

## 2014-10-17 ENCOUNTER — Other Ambulatory Visit: Payer: Self-pay | Admitting: Internal Medicine

## 2014-10-19 LAB — PROTIME-INR: INR: 2.3 — AB (ref 0.9–1.1)

## 2014-10-24 ENCOUNTER — Ambulatory Visit: Payer: Medicare Other | Admitting: Internal Medicine

## 2014-10-26 ENCOUNTER — Encounter: Payer: Self-pay | Admitting: Internal Medicine

## 2014-10-26 ENCOUNTER — Ambulatory Visit (INDEPENDENT_AMBULATORY_CARE_PROVIDER_SITE_OTHER): Payer: Medicare Other | Admitting: Internal Medicine

## 2014-10-26 VITALS — BP 118/73 | HR 82 | Temp 97.7°F | Ht 61.5 in | Wt 130.0 lb

## 2014-10-26 DIAGNOSIS — I4891 Unspecified atrial fibrillation: Secondary | ICD-10-CM

## 2014-10-26 DIAGNOSIS — N3000 Acute cystitis without hematuria: Secondary | ICD-10-CM | POA: Diagnosis not present

## 2014-10-26 DIAGNOSIS — K529 Noninfective gastroenteritis and colitis, unspecified: Secondary | ICD-10-CM | POA: Diagnosis not present

## 2014-10-26 LAB — POCT URINALYSIS DIPSTICK
Bilirubin, UA: NEGATIVE
Glucose, UA: NEGATIVE
Ketones, UA: NEGATIVE
Nitrite, UA: NEGATIVE
Protein, UA: NEGATIVE
Spec Grav, UA: 1.02
Urobilinogen, UA: 0.2
pH, UA: 6

## 2014-10-26 LAB — PROTIME-INR

## 2014-10-26 NOTE — Progress Notes (Signed)
Pre visit review using our clinic review tool, if applicable. No additional management support is needed unless otherwise documented below in the visit note. 

## 2014-10-26 NOTE — Assessment & Plan Note (Signed)
Recent E.Coli UTI in 09/2014. Some persistent symptoms of dysuria. UA and culture today.

## 2014-10-26 NOTE — Patient Instructions (Addendum)
We will schedule follow up with Dr. Tiffany Kocher. We may be able to Xifaxan to help with diarrhea.  Urine culture sent today.

## 2014-10-26 NOTE — Assessment & Plan Note (Signed)
Chronic diarrhea. Question if she might benefit from Xifaxan. Will set up follow up with GI, Dr. Tiffany Kocher. Continue Lomotil prn.

## 2014-10-26 NOTE — Assessment & Plan Note (Signed)
Atrial fibrillation. Rate well controlled with metoprolol. Anticoagulated with warfain, monitors at home. Follow up with cardiology as scheduled.

## 2014-10-26 NOTE — Progress Notes (Signed)
Subjective:    Patient ID: Kara [name], female    DOB: [birth year], 81 y.o.   MRN: [id number]  HPI  81YO female presents for follow up.  Last seen 4/29 with several complaints including chronic arthralgia and diarrhea.  Concerned that she may have UTI. Urine is dark yellow. Occasionally has itching or burning. No fever, chills.  Continues to have some itching over her skin. Plans to see her dermatologist for this.  AFIB - Compliant with medications. No dyspnea, palpitations. No recent bleeding, bruising.  Diarrhea - Continues to have watery diarrhea. At times, urgent with leakage of stool. Talked with her GI physician who recommended continuing Lomotil. No fever, abdominal pain.   Past medical, surgical, family and social history per today's encounter.  Review of Systems  Constitutional: Negative for fever, chills, appetite change, fatigue and unexpected weight change.  Eyes: Negative for visual disturbance.  Respiratory: Negative for shortness of breath.   Cardiovascular: Negative for chest pain and leg swelling.  Gastrointestinal: Positive for diarrhea. Negative for nausea, vomiting, abdominal pain, constipation, blood in stool and rectal pain.  Genitourinary: Positive for dysuria. Negative for urgency, frequency, hematuria, decreased urine volume, difficulty urinating and pelvic pain.  Skin: Negative for color change and rash.  Hematological: Negative for adenopathy. Does not bruise/bleed easily.  Psychiatric/Behavioral: Negative for sleep disturbance and dysphoric mood. The patient is not nervous/anxious.        Objective:    BP 118/73 mmHg  Pulse 82  Temp(Src) 97.7 F (36.5 C) (Oral)  Ht 5' 1.5" (1.562 m)  Wt 130 lb (58.968 kg)  BMI 24.17 kg/m2  SpO2 96% Physical Exam  Constitutional: She is oriented to person, place, and time. She appears well-developed and well-nourished. No distress.  HENT:  Head: Normocephalic and atraumatic.  Right Ear: External ear normal.   Left Ear: External ear normal.  Nose: Nose normal.  Mouth/Throat: Oropharynx is clear and moist. No oropharyngeal exudate.  Eyes: Conjunctivae are normal. Pupils are equal, round, and reactive to light. Right eye exhibits no discharge. Left eye exhibits no discharge. No scleral icterus.  Neck: Normal range of motion. Neck supple. No tracheal deviation present. No thyromegaly present.  Cardiovascular: Normal rate, regular rhythm, normal heart sounds and intact distal pulses.  Exam reveals no gallop and no friction rub.   No murmur heard. Pulmonary/Chest: Effort normal and breath sounds normal. No respiratory distress. She has no wheezes. She has no rales. She exhibits no tenderness.  Abdominal: There is no tenderness (no CVA tenderness).  Musculoskeletal: Normal range of motion. She exhibits no edema or tenderness.  Lymphadenopathy:    She has no cervical adenopathy.  Neurological: She is alert and oriented to person, place, and time. No cranial nerve deficit. She exhibits normal muscle tone. Coordination normal.  Skin: Skin is warm and dry. No rash noted. She is not diaphoretic. No erythema. No pallor.  Psychiatric: She has a normal mood and affect. Her behavior is normal. Judgment and thought content normal.          Assessment & Plan:   Problem List Items Addressed This Visit      Unprioritized   Acute cystitis without hematuria    Recent E.Coli UTI in 09/2014. Some persistent symptoms of dysuria. UA and culture today.      Relevant Orders   CULTURE, URINE COMPREHENSIVE   POCT Urinalysis Dipstick   Atrial fibrillation    Atrial fibrillation. Rate well controlled with metoprolol. Anticoagulated with warfain, monitors at home.  Follow up with cardiology as scheduled.      Chronic diarrhea - Primary    Chronic diarrhea. Question if she might benefit from Xifaxan. Will set up follow up with GI, Dr. Tiffany Kocher. Continue Lomotil prn.      Relevant Orders   Ambulatory referral to  Gastroenterology       Return in about 3 months (around 01/26/2015) for Recheck.

## 2014-10-29 LAB — CULTURE, URINE COMPREHENSIVE: Colony Count: >=100000

## 2014-10-30 ENCOUNTER — Other Ambulatory Visit: Payer: Self-pay | Admitting: *Deleted

## 2014-10-30 MED ORDER — FLUCONAZOLE 150 MG PO TABS: 150.0000 mg | ORAL_TABLET | Freq: Once | ORAL | Status: DC

## 2014-10-30 MED ORDER — CIPROFLOXACIN HCL 250 MG PO TABS: 250.0000 mg | ORAL_TABLET | Freq: Two times a day (BID) | ORAL | Status: DC

## 2014-10-31 DIAGNOSIS — E785 Hyperlipidemia, unspecified: Secondary | ICD-10-CM | POA: Diagnosis not present

## 2014-10-31 DIAGNOSIS — E119 Type 2 diabetes mellitus without complications: Secondary | ICD-10-CM | POA: Diagnosis not present

## 2014-10-31 DIAGNOSIS — I739 Peripheral vascular disease, unspecified: Secondary | ICD-10-CM | POA: Diagnosis not present

## 2014-10-31 DIAGNOSIS — I70213 Atherosclerosis of native arteries of extremities with intermittent claudication, bilateral legs: Secondary | ICD-10-CM | POA: Diagnosis not present

## 2014-10-31 DIAGNOSIS — I10 Essential (primary) hypertension: Secondary | ICD-10-CM | POA: Diagnosis not present

## 2014-10-31 DIAGNOSIS — M79609 Pain in unspecified limb: Secondary | ICD-10-CM | POA: Diagnosis not present

## 2014-11-02 DIAGNOSIS — I4891 Unspecified atrial fibrillation: Secondary | ICD-10-CM | POA: Diagnosis not present

## 2014-11-02 LAB — POCT INR: INR: 2.7 — AB (ref 0.9–1.1)

## 2014-11-09 LAB — PROTIME-INR

## 2014-11-10 ENCOUNTER — Other Ambulatory Visit: Payer: Self-pay | Admitting: *Deleted

## 2014-11-10 ENCOUNTER — Encounter: Payer: Self-pay | Admitting: *Deleted

## 2014-11-11 ENCOUNTER — Other Ambulatory Visit: Payer: Self-pay | Admitting: Internal Medicine

## 2014-11-13 ENCOUNTER — Other Ambulatory Visit: Payer: Self-pay | Admitting: *Deleted

## 2014-11-13 MED ORDER — COUMADIN 1 MG PO TABS: ORAL_TABLET | ORAL | Status: DC

## 2014-11-16 ENCOUNTER — Telehealth: Payer: Self-pay | Admitting: Internal Medicine

## 2014-11-16 LAB — PROTIME-INR

## 2014-11-16 NOTE — Telephone Encounter (Signed)
Coumadin level is therapeutic,  Continue current regimen and repeat PT/INR in one week 

## 2014-11-17 NOTE — Telephone Encounter (Signed)
Notified pt. 

## 2014-11-21 ENCOUNTER — Ambulatory Visit (INDEPENDENT_AMBULATORY_CARE_PROVIDER_SITE_OTHER): Payer: Medicare Other | Admitting: Urology

## 2014-11-21 ENCOUNTER — Encounter: Payer: Self-pay | Admitting: Urology

## 2014-11-21 VITALS — BP 122/68 | HR 89 | Ht 62.0 in | Wt 131.2 lb

## 2014-11-21 DIAGNOSIS — N952 Postmenopausal atrophic vaginitis: Secondary | ICD-10-CM | POA: Insufficient documentation

## 2014-11-21 DIAGNOSIS — N390 Urinary tract infection, site not specified: Secondary | ICD-10-CM

## 2014-11-21 DIAGNOSIS — B9629 Other Escherichia coli [E. coli] as the cause of diseases classified elsewhere: Secondary | ICD-10-CM | POA: Insufficient documentation

## 2014-11-21 DIAGNOSIS — B9620 Unspecified Escherichia coli [E. coli] as the cause of diseases classified elsewhere: Secondary | ICD-10-CM

## 2014-11-21 DIAGNOSIS — N362 Urethral caruncle: Secondary | ICD-10-CM | POA: Diagnosis not present

## 2014-11-21 HISTORY — DX: Unspecified Escherichia coli (E. coli) as the cause of diseases classified elsewhere: B96.20

## 2014-11-21 HISTORY — DX: Urinary tract infection, site not specified: N39.0

## 2014-11-21 LAB — MICROSCOPIC EXAMINATION: Bacteria, UA: NONE SEEN

## 2014-11-21 LAB — URINALYSIS, COMPLETE
Bilirubin, UA: NEGATIVE
Glucose, UA: NEGATIVE
Ketones, UA: NEGATIVE
Leukocytes, UA: NEGATIVE
Nitrite, UA: NEGATIVE
Protein, UA: NEGATIVE
Specific Gravity, UA: 1.01 (ref 1.005–1.030)
Urobilinogen, Ur: 0.2 mg/dL (ref 0.2–1.0)
pH, UA: 6 (ref 5.0–7.5)

## 2014-11-21 NOTE — Progress Notes (Signed)
In and Out Catheterization  Patient is present today for a I & O catheterization due to three month recheck for recurrent uti. Patient was cleaned and prepped in a sterile fashion with betadine.  A 14FR cath was inserted with jelly on tip no complications were noted , 264ml of urine return was noted, urine was clear and light yellow in color. A clean urine sample was collected for clean catch urine. Bladder was drained  and catheter was removed with out difficulty.    Preformed by: Lyndee Hensen CMA

## 2014-11-21 NOTE — Progress Notes (Addendum)
11/21/2014 11:27 AM   Kara [name] November 03, 1922 [id number]  Referring provider: Jackolyn Confer, MD [address] [place], [place] [address]  Chief Complaint  Patient presents with  . Follow-up    3 month for recurrent uti    HPI: Kara [name] is a 81 year old white female with a history of atrophic vaginitis, urethral caruncle and recurrent urinary tract infections.  She presents for a 3 month follow-up. She states that she has had episodes of chronic diarrhea and has not used her vaginal estrogen cream as prescribed. She states that she is also forgetful and that contributes to her symptoms of atrophy, such as vaginal itching and burning.  Today, she denies any dysuria, gross hematuria or suprapubic pain. She denies any fevers, chills, nausea or vomiting.  She has had positive urine cultures with Escherichia coli which have been pan sensitive.     PMH: Past Medical History  Diagnosis Date  . Hypertension   . Atrial fibrillation   . CAD (coronary artery disease)   . Chronic diarrhea   . GERD (gastroesophageal reflux disease)   . Hyperlipidemia   . History of colon polyps   . Glaucoma   . Fibrocystic breast disease   . Cancer     UTERINE  . Allergy   . IBS (irritable bowel syndrome)   . Plantar fasciitis   . Edema of lower extremity   . Sleep apnea, obstructive   . Hematuria   . History of pancreatitis   . Hypokalemia   . Hemorrhoids   . Osteoarthritis   . Decubitus ulcer     sacral region  . Pernicious anemia   . Vitamin D deficiency   . Chronic anticoagulation   . Skin cancer   . Dysuria   . Mitral valve regurgitation   . Diabetes   . Glaucoma   . Recurrent UTI   . Vaginal atrophy   . Yeast vaginitis   . Microscopic hematuria     Surgical History: Past Surgical History  Procedure Laterality Date  . Breast biopsy  1970's  . Appendectomy  1940's  . Tonsillectomy  1936  . Abdominal hysterectomy  1980's  . Refractive surgery      for  bilateral glaumoma  . Pacemaker placement    . Abdominal surgery      for villous polyp,,,many years ago  . Ascad, s/p ptca  11/28/2005    MID LESION   . Colectomy      Home Medications:    Medication List       This list is accurate as of: 11/21/14 11:27 AM.  Always use your most recent med list.               Calcium-D 600-400 MG-UNIT Tabs  Take 1 tablet by mouth daily.     chlorhexidine 0.12 % solution  Commonly known as:  PERIDEX     cholecalciferol 1000 UNITS tablet  Commonly known as:  VITAMIN D  Take 1,000 Units by mouth daily.     ciprofloxacin 250 MG tablet  Commonly known as:  CIPRO  Take 1 tablet (250 mg total) by mouth 2 (two) times daily.     COUMADIN 1 MG tablet  Generic drug:  warfarin  TAKE 1 MG AND 1.5 MG ALTERNATING     cyanocobalamin 1000 MCG/ML injection  Commonly known as:  (VITAMIN B-12)  INJECT 1 ML  INTO THE MUSCLE EVERY 30 DAYS.     diltiazem 120 MG 24  hr capsule  Commonly known as:  CARDIZEM CD  Take 1 capsule (120 mg total) by mouth daily.     diphenoxylate-atropine 2.5-0.025 MG per tablet  Commonly known as:  LOMOTIL  TAKE 1 TABLET FOUR TIMES DAILY AS NEEDED FOR DIARRHEA OR LOOSE STOOLS     estradiol 0.1 MG/GM vaginal cream  Commonly known as:  ESTRACE  Place 1 Applicatorful vaginally at bedtime.     fluconazole 150 MG tablet  Commonly known as:  DIFLUCAN  Take 1 tablet (150 mg total) by mouth once.     GENTEAL 0.3 % Soln  Generic drug:  Hypromellose  Apply 1 drop to eye daily.     Hydrocodone-Acetaminophen 5-300 MG Tabs     hydrocortisone-pramoxine 2.5-1 % rectal cream  Commonly known as:  ANALPRAM-HC  APPLY RECTALLY THREE TIMES DAILY     lidocaine 5 %  Commonly known as:  LIDODERM  Place 1 patch onto the skin daily. Remove & Discard patch within 12 hours or as directed by MD     metoprolol 50 MG tablet  Commonly known as:  LOPRESSOR  TAKE 1 TABLET BY MOUTH 2  TIMES DAILY.     metoprolol 50 MG tablet  Commonly  known as:  LOPRESSOR  TAKE 1 TABLET BY MOUTH 2  TIMES DAILY.     multivitamin-iron-minerals-folic acid chewable tablet  Chew 1 tablet by mouth daily.     nystatin powder  Commonly known as:  MYCOSTATIN  Apply topically 4 (four) times daily as needed.     potassium chloride 10 MEQ tablet  Commonly known as:  K-DUR  Take by mouth.     potassium chloride 10 MEQ tablet  Commonly known as:  K-DUR,KLOR-CON  Take by mouth.     triamcinolone cream 0.1 %  Commonly known as:  KENALOG  Apply 1 application topically 2 (two) times daily.        Allergies:  Allergies  Allergen Reactions  . Baycol [Cerivastatin Sodium]   . Cardizem [Diltiazem Hcl]     LOWER EXTREMITY EDEMA  . Crestor [Rosuvastatin Calcium] Other (See Comments)    INTOLERANT  . Dronedarone Other (See Comments)    Fatigue  . Metoprolol Tartrate Other (See Comments)  . Omeprazole Other (See Comments)  . Pravachol     INTOLERANT  . Statins Other (See Comments)  . Vioxx [Rofecoxib]   . Zocor [Simvastatin]     INTOLERANT    Family History: Family History  Problem Relation Age of Onset  . Cancer Sister   . Diabetes    . Cancer Mother     COLON  . Coronary artery disease Father   . Kidney disease Father   . Bladder Cancer Neg Hx     Social History:  reports that she has quit smoking. She has never used smokeless tobacco. She reports that she drinks alcohol. She reports that she uses illicit drugs.  ROS: Urological Symptom Review  Patient is experiencing the following symptoms: Hard to postpone urination Get up at night to urinate Stream starts and stops   Review of Systems  Gastrointestinal (upper)  : Negative for upper GI symptoms  Gastrointestinal (lower) : Diarrhea  Constitutional : Negative for symptoms  Skin: Negative for skin symptoms  Eyes: Negative for eye symptoms  Ear/Nose/Throat : Negative for Ear/Nose/Throat symptoms  Hematologic/Lymphatic: Negative for  Hematologic/Lymphatic symptoms  Cardiovascular : Leg swelling  Respiratory : Negative for respiratory symptoms  Endocrine: Negative for endocrine symptoms  Musculoskeletal: Negative for musculoskeletal  symptoms  Neurological: Negative for neurological symptoms  Psychologic: Negative for psychiatric symptoms   Physical Exam: BP 122/68 mmHg  Pulse 89  Ht 5\' 2"  (1.575 m)  Wt 131 lb 3.2 oz (59.512 kg)  BMI 23.99 kg/m2  GU: Atrophic external genitalia.  Normal urethral meatus.  Urethral carpal is noted. No bladder fullness or masses. No vaginal lesions or discharge. Normal rectal tone, no masses. Normal anus and perineum.   Laboratory Data: Results for orders placed or performed in visit on 11/03/14  POCT INR  Result Value Ref Range   INR 2.7 (A) 0.9 - 1.1   Lab Results  Component Value Date   WBC 8.4 07/13/2014   HGB 14.2 07/13/2014   HCT 41.8 07/13/2014   MCV 87.6 07/13/2014   PLT 180.0 07/13/2014    Lab Results  Component Value Date   CREATININE 0.82 09/15/2014    No results found for: PSA  No results found for: TESTOSTERONE  Lab Results  Component Value Date   HGBA1C 6.4 10/14/2011    Urinalysis    Component Value Date/Time   BILIRUBINUR neg 10/26/2014 1401   PROTEINUR neg 10/26/2014 1401   UROBILINOGEN 0.2 10/26/2014 1401   NITRITE neg 10/26/2014 1401   LEUKOCYTESUR small (1+) 10/26/2014 1401    Pertinent Imaging:   Assessment & Plan:    1. Recurrent UTI:   Patient is currently not having symptoms of urinary tract infection at this time.  2. Vaginal atrophy:   Patient has vaginal estrogen cream at home. She will restart the medication applying it every night for 2 weeks and then Monday Wednesday Friday.  We will reexamine the patient in 3 months.  - Urinalysis, Complete  3. Urethral caruncle:   Patient has a vaginal estrogen cream at home. She will restart that medication. We will reexamine the patient in 3 months.  4. Microscopic  hematuria:  Most likely from the caruncle. We will reexamine the urine when she returns in 3 months time. No Follow-up on file.  [name], [facility] [address] [place], [place] [address] [phone]

## 2014-11-23 LAB — PROTIME-INR

## 2014-11-24 ENCOUNTER — Telehealth: Payer: Self-pay | Admitting: *Deleted

## 2014-11-24 ENCOUNTER — Encounter: Payer: Self-pay | Admitting: Internal Medicine

## 2014-11-24 NOTE — Telephone Encounter (Signed)
Notitified pt, Pt verbalized understanding

## 2014-11-24 NOTE — Telephone Encounter (Signed)
Received PT/INR self testing result.  PT/INR Resutl 3.0. Test date 11/23/14

## 2014-11-24 NOTE — Telephone Encounter (Signed)
OK. No change made to coumadin dosing. She can continue to check weekly INR at home.

## 2014-11-27 ENCOUNTER — Telehealth: Payer: Self-pay | Admitting: *Deleted

## 2014-11-27 NOTE — Telephone Encounter (Signed)
Pt requested handicap parking decal form to be completed.  Form completed, signed and mailed to pt

## 2014-11-30 DIAGNOSIS — I4891 Unspecified atrial fibrillation: Secondary | ICD-10-CM | POA: Diagnosis not present

## 2014-11-30 LAB — POCT INR: INR: 2.6

## 2014-12-01 DIAGNOSIS — E119 Type 2 diabetes mellitus without complications: Secondary | ICD-10-CM | POA: Diagnosis not present

## 2014-12-01 LAB — HM DIABETES EYE EXAM

## 2014-12-04 ENCOUNTER — Encounter: Payer: Self-pay | Admitting: *Deleted

## 2014-12-07 LAB — POCT INR: INR: 2.8

## 2014-12-12 DIAGNOSIS — I2510 Atherosclerotic heart disease of native coronary artery without angina pectoris: Secondary | ICD-10-CM | POA: Diagnosis not present

## 2014-12-12 DIAGNOSIS — E782 Mixed hyperlipidemia: Secondary | ICD-10-CM | POA: Diagnosis not present

## 2014-12-12 DIAGNOSIS — I071 Rheumatic tricuspid insufficiency: Secondary | ICD-10-CM | POA: Diagnosis not present

## 2014-12-14 LAB — POCT INR: INR: 3.1

## 2014-12-26 ENCOUNTER — Encounter: Payer: Self-pay | Admitting: Internal Medicine

## 2014-12-29 DIAGNOSIS — I4891 Unspecified atrial fibrillation: Secondary | ICD-10-CM | POA: Diagnosis not present

## 2014-12-29 LAB — PROTIME-INR

## 2015-01-02 DIAGNOSIS — K529 Noninfective gastroenteritis and colitis, unspecified: Secondary | ICD-10-CM | POA: Diagnosis not present

## 2015-01-04 LAB — PROTIME-INR

## 2015-01-08 ENCOUNTER — Other Ambulatory Visit: Payer: Self-pay | Admitting: *Deleted

## 2015-01-08 ENCOUNTER — Telehealth: Payer: Self-pay | Admitting: *Deleted

## 2015-01-08 MED ORDER — FLUCONAZOLE 150 MG PO TABS: 150.0000 mg | ORAL_TABLET | Freq: Once | ORAL | Status: DC

## 2015-01-08 NOTE — Telephone Encounter (Signed)
Pt called requesting medication for a yeast infection.  I attempted to call pt back to ask symptoms.  No answer, left message on VM to return call.

## 2015-01-08 NOTE — Telephone Encounter (Signed)
Yes, if having classic symptoms of itching, burning, vaginal discharge, then fine to call in Diflucan 150mg  po x1

## 2015-01-10 ENCOUNTER — Ambulatory Visit
Admission: RE | Admit: 2015-01-10 | Discharge: 2015-01-10 | Disposition: A | Discharge status: Home / Self Care | Payer: Medicare Other | Source: Ambulatory Visit | Attending: Family Medicine | Admitting: Family Medicine

## 2015-01-10 ENCOUNTER — Other Ambulatory Visit: Payer: Self-pay | Admitting: Family Medicine

## 2015-01-10 DIAGNOSIS — W19XXXA Unspecified fall, initial encounter: Secondary | ICD-10-CM | POA: Insufficient documentation

## 2015-01-10 DIAGNOSIS — G319 Degenerative disease of nervous system, unspecified: Secondary | ICD-10-CM | POA: Diagnosis not present

## 2015-01-10 DIAGNOSIS — S0990XA Unspecified injury of head, initial encounter: Secondary | ICD-10-CM | POA: Insufficient documentation

## 2015-01-10 DIAGNOSIS — S42202A Unspecified fracture of upper end of left humerus, initial encounter for closed fracture: Secondary | ICD-10-CM | POA: Diagnosis not present

## 2015-01-10 DIAGNOSIS — S4992XA Unspecified injury of left shoulder and upper arm, initial encounter: Secondary | ICD-10-CM | POA: Diagnosis not present

## 2015-01-10 DIAGNOSIS — Z7901 Long term (current) use of anticoagulants: Secondary | ICD-10-CM | POA: Diagnosis not present

## 2015-01-10 DIAGNOSIS — Z5181 Encounter for therapeutic drug level monitoring: Secondary | ICD-10-CM | POA: Diagnosis not present

## 2015-01-11 ENCOUNTER — Encounter: Payer: Self-pay | Admitting: Internal Medicine

## 2015-01-11 LAB — PROTIME-INR: INR: 2.8 — AB (ref 0.9–1.1)

## 2015-01-13 ENCOUNTER — Other Ambulatory Visit: Payer: Self-pay | Admitting: Internal Medicine

## 2015-01-16 DIAGNOSIS — S42202A Unspecified fracture of upper end of left humerus, initial encounter for closed fracture: Secondary | ICD-10-CM | POA: Diagnosis not present

## 2015-01-16 DIAGNOSIS — M25512 Pain in left shoulder: Secondary | ICD-10-CM | POA: Diagnosis not present

## 2015-01-18 ENCOUNTER — Encounter: Payer: Self-pay | Admitting: Internal Medicine

## 2015-01-19 ENCOUNTER — Telehealth: Payer: Self-pay | Admitting: *Deleted

## 2015-01-19 NOTE — Telephone Encounter (Signed)
Pt/INR self testing results received by fax, test completed on 01/18/15 @ 10:35pm with INR 2.3

## 2015-01-25 DIAGNOSIS — I4891 Unspecified atrial fibrillation: Secondary | ICD-10-CM | POA: Diagnosis not present

## 2015-01-29 ENCOUNTER — Ambulatory Visit (INDEPENDENT_AMBULATORY_CARE_PROVIDER_SITE_OTHER): Payer: Medicare Other | Admitting: Internal Medicine

## 2015-01-29 ENCOUNTER — Encounter: Payer: Self-pay | Admitting: Internal Medicine

## 2015-01-29 VITALS — BP 138/82 | HR 87 | Temp 98.4°F | Ht 61.5 in | Wt 129.4 lb

## 2015-01-29 DIAGNOSIS — S42209A Unspecified fracture of upper end of unspecified humerus, initial encounter for closed fracture: Secondary | ICD-10-CM | POA: Insufficient documentation

## 2015-01-29 DIAGNOSIS — L989 Disorder of the skin and subcutaneous tissue, unspecified: Secondary | ICD-10-CM | POA: Diagnosis not present

## 2015-01-29 DIAGNOSIS — Z23 Encounter for immunization: Secondary | ICD-10-CM | POA: Diagnosis not present

## 2015-01-29 DIAGNOSIS — Z7901 Long term (current) use of anticoagulants: Secondary | ICD-10-CM

## 2015-01-29 DIAGNOSIS — K529 Noninfective gastroenteritis and colitis, unspecified: Secondary | ICD-10-CM | POA: Diagnosis not present

## 2015-01-29 DIAGNOSIS — S42202D Unspecified fracture of upper end of left humerus, subsequent encounter for fracture with routine healing: Secondary | ICD-10-CM | POA: Diagnosis not present

## 2015-01-29 LAB — COMPREHENSIVE METABOLIC PANEL
ALT: 14 U/L (ref 0–35)
AST: 18 U/L (ref 0–37)
Albumin: 3.5 g/dL (ref 3.5–5.2)
Alkaline Phosphatase: 97 U/L (ref 39–117)
BUN: 15 mg/dL (ref 6–23)
CO2: 29 mEq/L (ref 19–32)
Calcium: 9.1 mg/dL (ref 8.4–10.5)
Chloride: 101 mEq/L (ref 96–112)
Creatinine, Ser: 0.84 mg/dL (ref 0.40–1.20)
GFR: 67.65 mL/min (ref >60.00)
Glucose, Bld: 161 mg/dL — ABNORMAL HIGH (ref 70–99)
Potassium: 3.2 mEq/L — ABNORMAL LOW (ref 3.5–5.1)
Sodium: 140 mEq/L (ref 135–145)
Total Bilirubin: 1.3 mg/dL — ABNORMAL HIGH (ref 0.2–1.2)
Total Protein: 6.7 g/dL (ref 6.0–8.3)

## 2015-01-29 LAB — PROTIME-INR
INR: 2.4 ratio — ABNORMAL HIGH (ref 0.8–1.0)
Prothrombin Time: 26.3 s — ABNORMAL HIGH (ref 9.6–13.1)

## 2015-01-29 MED ORDER — DIPHENOXYLATE-ATROPINE 2.5-0.025 MG PO TABS: ORAL_TABLET | ORAL | Status: DC

## 2015-01-29 MED ORDER — HYDROCODONE-ACETAMINOPHEN 5-325 MG PO TABS: 1.0000 | ORAL_TABLET | Freq: Three times a day (TID) | ORAL | Status: DC | PRN

## 2015-01-29 NOTE — Assessment & Plan Note (Signed)
Closed fracture of humerus. Reviewed notes from ortho. Continue sling. Hydrocodone for severe pain only.

## 2015-01-29 NOTE — Assessment & Plan Note (Signed)
Symptoms relatively stable and controlled with prn Lomotil. Will continue.

## 2015-01-29 NOTE — Progress Notes (Signed)
Pre visit review using our clinic review tool, if applicable. No additional management support is needed unless otherwise documented below in the visit note. 

## 2015-01-29 NOTE — Patient Instructions (Signed)
Labs today.  Follow up in 3 months or sooner as needed. 

## 2015-01-29 NOTE — Progress Notes (Signed)
Subjective:    Patient ID: Kara [name], female    DOB: April 15, 1923, 81 y.o.   MRN: [id number]  HPI  81YO female presents for follow up.  Three weeks ago fell while walking up stairs and broke left humerous. Seen by Sherren Mocha. [place]. Treated with pain medication, sling. Having some swelling in left hand. Also having some pain in left chest wall.  She has hired some assistance at her assisted living. Has follow up with ortho tomorrow. Has not recently used Hydrocodone for pain, however is out of medication. CT head at time was normal.  Chronic diarrhea - Symptoms well controlled with prn Lomotil. No persistent abdominal pain. No changes in appetite.  Left leg skin lesion - Scabbed area medial left lower leg. Present for a few months. Not painful or changing in size. She is not applying anything to this lesion.  Wt Readings from Last 3 Encounters:  01/29/15 129 lb 6 oz (58.684 kg)  11/21/14 131 lb 3.2 oz (59.512 kg)  10/26/14 130 lb (58.968 kg)   BP Readings from Last 3 Encounters:  01/29/15 138/82  11/21/14 122/68  10/26/14 118/73      Past Medical History  Diagnosis Date  . Hypertension   . Atrial fibrillation   . CAD (coronary artery disease)   . Chronic diarrhea   . GERD (gastroesophageal reflux disease)   . Hyperlipidemia   . History of colon polyps   . Glaucoma   . Fibrocystic breast disease   . Cancer     UTERINE  . Allergy   . IBS (irritable bowel syndrome)   . Plantar fasciitis   . Edema of lower extremity   . Sleep apnea, obstructive   . Hematuria   . History of pancreatitis   . Hypokalemia   . Hemorrhoids   . Osteoarthritis   . Decubitus ulcer     sacral region  . Pernicious anemia   . Vitamin D deficiency   . Chronic anticoagulation   . Skin cancer   . Dysuria   . Mitral valve regurgitation   . Diabetes   . Glaucoma   . Recurrent UTI   . Vaginal atrophy   . Yeast vaginitis   . Microscopic hematuria    Family History  Problem Relation Age of  Onset  . Cancer Sister   . Diabetes    . Cancer Mother     COLON  . Coronary artery disease Father   . Kidney disease Father   . Bladder Cancer Neg Hx    Past Surgical History  Procedure Laterality Date  . Breast biopsy  1970's  . Appendectomy  1940's  . Tonsillectomy  1936  . Abdominal hysterectomy  1980's  . Refractive surgery      for bilateral glaumoma  . Pacemaker placement    . Abdominal surgery      for villous polyp,,,many years ago  . Ascad, s/p ptca  11/28/2005    MID LESION   . Colectomy     Social History   Social History  . Marital Status: Married    Spouse Name: N/A  . Number of Children: 3  . Years of Education: N/A   Occupational History  . Retired Restaurant manager, fast food - primary    Social History Main Topics  . Smoking status: Former Research scientist (life sciences)  . Smokeless tobacco: Never Used     Comment: quit 45 years ago  . Alcohol Use: 0.0 oz/week    0 Standard drinks or equivalent  per week     Comment: Rarely  . Drug Use: Yes  . Sexual Activity: Not Asked   Other Topics Concern  . None   Social History Narrative   Lives at [place]. Born in [place]. Travels frequently.      1 daughter, 2 step children      Regular Exercise -  NO   Daily Caffeine Use:  1 coffee             Review of Systems  Constitutional: Negative for fever, chills, appetite change, fatigue and unexpected weight change.  Eyes: Negative for visual disturbance.  Respiratory: Negative for shortness of breath.   Cardiovascular: Negative for chest pain and leg swelling.  Gastrointestinal: Negative for abdominal pain.  Musculoskeletal: Positive for myalgias and arthralgias.  Skin: Negative for color change and rash.  Neurological: Positive for weakness. Negative for numbness.  Hematological: Negative for adenopathy. Does not bruise/bleed easily.  Psychiatric/Behavioral: Negative for dysphoric mood. The patient is not nervous/anxious.        Objective:    BP 138/82 mmHg   Pulse 87  Temp(Src) 98.4 F (36.9 C) (Oral)  Ht 5' 1.5" (1.562 m)  Wt 129 lb 6 oz (58.684 kg)  BMI 24.05 kg/m2  SpO2 97% Physical Exam  Constitutional: She is oriented to person, place, and time. She appears well-developed and well-nourished. No distress.  HENT:  Head: Normocephalic and atraumatic.  Right Ear: External ear normal.  Left Ear: External ear normal.  Nose: Nose normal.  Mouth/Throat: Oropharynx is clear and moist. No oropharyngeal exudate.  Eyes: Conjunctivae are normal. Pupils are equal, round, and reactive to light. Right eye exhibits no discharge. Left eye exhibits no discharge. No scleral icterus.  Neck: Normal range of motion. Neck supple. No tracheal deviation present. No thyromegaly present.  Cardiovascular: Normal rate, regular rhythm, normal heart sounds and intact distal pulses.  Exam reveals no gallop and no friction rub.   No murmur heard. Pulmonary/Chest: Effort normal and breath sounds normal. No respiratory distress. She has no wheezes. She has no rales. She exhibits no tenderness.  Musculoskeletal: She exhibits no edema or tenderness.       Left shoulder: She exhibits decreased range of motion, swelling, pain and decreased strength.  Left arm in sling and hand is discolored with bluish ecchymoses.  Lymphadenopathy:    She has no cervical adenopathy.  Neurological: She is alert and oriented to person, place, and time. No cranial nerve deficit. She exhibits normal muscle tone. Coordination normal.  Skin: Skin is warm and dry. No rash noted. She is not diaphoretic. No erythema. No pallor.  Psychiatric: She has a normal mood and affect. Her behavior is normal. Judgment and thought content normal.          Assessment & Plan:   Problem List Items Addressed This Visit      Unprioritized   Chronic anticoagulation    Repeat INR with labs today. Goal INR 2-3.      Relevant Orders   Protime-INR   Chronic diarrhea    Symptoms relatively stable and  controlled with prn Lomotil. Will continue.      Relevant Orders   Comprehensive metabolic panel   Closed fracture of part of upper end of humerus - Primary    Closed fracture of humerus. Reviewed notes from ortho. Continue sling. Hydrocodone for severe pain only.      Skin lesion    Skin lesion left lower leg concerning for SCC. Will  set up dermatology for biopsy.      Relevant Orders   Ambulatory referral to Dermatology       Return in about 3 months (around 04/30/2015) for Recheck.

## 2015-01-29 NOTE — Assessment & Plan Note (Signed)
Skin lesion left lower leg concerning for SCC. Will set up dermatology for biopsy.

## 2015-01-29 NOTE — Assessment & Plan Note (Signed)
Repeat INR with labs today. Goal INR 2-3.

## 2015-01-30 DIAGNOSIS — S42202D Unspecified fracture of upper end of left humerus, subsequent encounter for fracture with routine healing: Secondary | ICD-10-CM | POA: Diagnosis not present

## 2015-01-30 DIAGNOSIS — M25512 Pain in left shoulder: Secondary | ICD-10-CM | POA: Diagnosis not present

## 2015-02-06 DIAGNOSIS — M25512 Pain in left shoulder: Secondary | ICD-10-CM | POA: Diagnosis not present

## 2015-02-06 DIAGNOSIS — S42202D Unspecified fracture of upper end of left humerus, subsequent encounter for fracture with routine healing: Secondary | ICD-10-CM | POA: Diagnosis not present

## 2015-02-07 LAB — POCT INR: INR: 3.2 — AB (ref <=1.1)

## 2015-02-12 DIAGNOSIS — M25512 Pain in left shoulder: Secondary | ICD-10-CM | POA: Diagnosis not present

## 2015-02-12 DIAGNOSIS — S42202D Unspecified fracture of upper end of left humerus, subsequent encounter for fracture with routine healing: Secondary | ICD-10-CM | POA: Diagnosis not present

## 2015-02-13 DIAGNOSIS — K529 Noninfective gastroenteritis and colitis, unspecified: Secondary | ICD-10-CM | POA: Diagnosis not present

## 2015-02-15 ENCOUNTER — Telehealth: Payer: Self-pay | Admitting: *Deleted

## 2015-02-15 NOTE — Telephone Encounter (Signed)
Received PT/INR results, 3.5.  Confirmed coumadin dose.  Pt states she is taking 1.5 mg M-W-F and 1 mg T-Th-Sat-Sun.  Pt knew results were high so she took .5 mg last night.  Per Dr Gilford Rile, notified pt to take 1 mg daily and test next week.

## 2015-02-16 DIAGNOSIS — M25512 Pain in left shoulder: Secondary | ICD-10-CM | POA: Diagnosis not present

## 2015-02-16 DIAGNOSIS — S42202D Unspecified fracture of upper end of left humerus, subsequent encounter for fracture with routine healing: Secondary | ICD-10-CM | POA: Diagnosis not present

## 2015-02-17 DIAGNOSIS — S42202D Unspecified fracture of upper end of left humerus, subsequent encounter for fracture with routine healing: Secondary | ICD-10-CM | POA: Diagnosis not present

## 2015-02-17 DIAGNOSIS — M25512 Pain in left shoulder: Secondary | ICD-10-CM | POA: Diagnosis not present

## 2015-02-20 DIAGNOSIS — M25512 Pain in left shoulder: Secondary | ICD-10-CM | POA: Diagnosis not present

## 2015-02-20 DIAGNOSIS — S42202D Unspecified fracture of upper end of left humerus, subsequent encounter for fracture with routine healing: Secondary | ICD-10-CM | POA: Diagnosis not present

## 2015-02-21 ENCOUNTER — Ambulatory Visit: Payer: Self-pay | Admitting: Urology

## 2015-02-21 DIAGNOSIS — S42202D Unspecified fracture of upper end of left humerus, subsequent encounter for fracture with routine healing: Secondary | ICD-10-CM | POA: Diagnosis not present

## 2015-02-21 DIAGNOSIS — M25512 Pain in left shoulder: Secondary | ICD-10-CM | POA: Diagnosis not present

## 2015-02-21 LAB — PROTIME-INR

## 2015-02-23 DIAGNOSIS — M25512 Pain in left shoulder: Secondary | ICD-10-CM | POA: Diagnosis not present

## 2015-02-23 DIAGNOSIS — S42202D Unspecified fracture of upper end of left humerus, subsequent encounter for fracture with routine healing: Secondary | ICD-10-CM | POA: Diagnosis not present

## 2015-02-27 DIAGNOSIS — S42202D Unspecified fracture of upper end of left humerus, subsequent encounter for fracture with routine healing: Secondary | ICD-10-CM | POA: Diagnosis not present

## 2015-02-27 DIAGNOSIS — M25512 Pain in left shoulder: Secondary | ICD-10-CM | POA: Diagnosis not present

## 2015-02-28 LAB — PROTIME-INR: INR: 2.4 — AB (ref 0.9–1.1)

## 2015-03-01 DIAGNOSIS — S42202D Unspecified fracture of upper end of left humerus, subsequent encounter for fracture with routine healing: Secondary | ICD-10-CM | POA: Diagnosis not present

## 2015-03-01 DIAGNOSIS — M25512 Pain in left shoulder: Secondary | ICD-10-CM | POA: Diagnosis not present

## 2015-03-01 DIAGNOSIS — I4891 Unspecified atrial fibrillation: Secondary | ICD-10-CM | POA: Diagnosis not present

## 2015-03-01 LAB — PROTIME-INR: INR: 2.4 — AB (ref 0.9–1.1)

## 2015-03-07 DIAGNOSIS — S42202D Unspecified fracture of upper end of left humerus, subsequent encounter for fracture with routine healing: Secondary | ICD-10-CM | POA: Diagnosis not present

## 2015-03-07 DIAGNOSIS — M25512 Pain in left shoulder: Secondary | ICD-10-CM | POA: Diagnosis not present

## 2015-03-09 DIAGNOSIS — S42202D Unspecified fracture of upper end of left humerus, subsequent encounter for fracture with routine healing: Secondary | ICD-10-CM | POA: Diagnosis not present

## 2015-03-09 DIAGNOSIS — M25512 Pain in left shoulder: Secondary | ICD-10-CM | POA: Diagnosis not present

## 2015-03-13 DIAGNOSIS — M25512 Pain in left shoulder: Secondary | ICD-10-CM | POA: Diagnosis not present

## 2015-03-13 DIAGNOSIS — S42202D Unspecified fracture of upper end of left humerus, subsequent encounter for fracture with routine healing: Secondary | ICD-10-CM | POA: Diagnosis not present

## 2015-03-14 LAB — PROTIME-INR: INR: 1.9 — AB (ref 0.9–1.1)

## 2015-03-15 ENCOUNTER — Telehealth: Payer: Self-pay

## 2015-03-15 DIAGNOSIS — S42202D Unspecified fracture of upper end of left humerus, subsequent encounter for fracture with routine healing: Secondary | ICD-10-CM | POA: Diagnosis not present

## 2015-03-15 DIAGNOSIS — M25512 Pain in left shoulder: Secondary | ICD-10-CM | POA: Diagnosis not present

## 2015-03-15 NOTE — Telephone Encounter (Signed)
Spoke with patient, verbalized understanding of results and plan

## 2015-03-15 NOTE — Telephone Encounter (Signed)
Out of Range INR called in:  1.9 from specimen last evening.  Please advise?

## 2015-03-15 NOTE — Telephone Encounter (Signed)
She can continue Coumadin same dose and repeat INR in 1 week. She does her INR checks at home

## 2015-03-16 ENCOUNTER — Encounter: Payer: Self-pay | Admitting: Internal Medicine

## 2015-03-16 DIAGNOSIS — Z85828 Personal history of other malignant neoplasm of skin: Secondary | ICD-10-CM | POA: Diagnosis not present

## 2015-03-16 DIAGNOSIS — L821 Other seborrheic keratosis: Secondary | ICD-10-CM | POA: Diagnosis not present

## 2015-03-16 DIAGNOSIS — D2272 Melanocytic nevi of left lower limb, including hip: Secondary | ICD-10-CM | POA: Diagnosis not present

## 2015-03-16 DIAGNOSIS — D225 Melanocytic nevi of trunk: Secondary | ICD-10-CM | POA: Diagnosis not present

## 2015-03-19 DIAGNOSIS — S42202D Unspecified fracture of upper end of left humerus, subsequent encounter for fracture with routine healing: Secondary | ICD-10-CM | POA: Diagnosis not present

## 2015-03-19 DIAGNOSIS — M25512 Pain in left shoulder: Secondary | ICD-10-CM | POA: Diagnosis not present

## 2015-03-20 DIAGNOSIS — S42202D Unspecified fracture of upper end of left humerus, subsequent encounter for fracture with routine healing: Secondary | ICD-10-CM | POA: Diagnosis not present

## 2015-03-20 DIAGNOSIS — M25512 Pain in left shoulder: Secondary | ICD-10-CM | POA: Diagnosis not present

## 2015-03-23 DIAGNOSIS — M25512 Pain in left shoulder: Secondary | ICD-10-CM | POA: Diagnosis not present

## 2015-03-23 DIAGNOSIS — S42202D Unspecified fracture of upper end of left humerus, subsequent encounter for fracture with routine healing: Secondary | ICD-10-CM | POA: Diagnosis not present

## 2015-03-26 DIAGNOSIS — M25512 Pain in left shoulder: Secondary | ICD-10-CM | POA: Diagnosis not present

## 2015-03-26 DIAGNOSIS — S42202D Unspecified fracture of upper end of left humerus, subsequent encounter for fracture with routine healing: Secondary | ICD-10-CM | POA: Diagnosis not present

## 2015-03-28 DIAGNOSIS — I4891 Unspecified atrial fibrillation: Secondary | ICD-10-CM | POA: Diagnosis not present

## 2015-03-28 LAB — PROTIME-INR: INR: 2.5 — AB (ref 0.9–1.1)

## 2015-04-03 DIAGNOSIS — S42202D Unspecified fracture of upper end of left humerus, subsequent encounter for fracture with routine healing: Secondary | ICD-10-CM | POA: Diagnosis not present

## 2015-04-03 DIAGNOSIS — M25512 Pain in left shoulder: Secondary | ICD-10-CM | POA: Diagnosis not present

## 2015-04-05 DIAGNOSIS — M25512 Pain in left shoulder: Secondary | ICD-10-CM | POA: Diagnosis not present

## 2015-04-05 DIAGNOSIS — S42202D Unspecified fracture of upper end of left humerus, subsequent encounter for fracture with routine healing: Secondary | ICD-10-CM | POA: Diagnosis not present

## 2015-04-11 DIAGNOSIS — M25512 Pain in left shoulder: Secondary | ICD-10-CM | POA: Diagnosis not present

## 2015-04-11 DIAGNOSIS — S42202D Unspecified fracture of upper end of left humerus, subsequent encounter for fracture with routine healing: Secondary | ICD-10-CM | POA: Diagnosis not present

## 2015-04-12 LAB — POCT INR: INR: 2.5 — AB (ref <=1.1)

## 2015-04-13 ENCOUNTER — Other Ambulatory Visit: Payer: Self-pay | Admitting: Internal Medicine

## 2015-04-13 DIAGNOSIS — S42202D Unspecified fracture of upper end of left humerus, subsequent encounter for fracture with routine healing: Secondary | ICD-10-CM | POA: Diagnosis not present

## 2015-04-13 DIAGNOSIS — M25512 Pain in left shoulder: Secondary | ICD-10-CM | POA: Diagnosis not present

## 2015-04-17 DIAGNOSIS — I480 Paroxysmal atrial fibrillation: Secondary | ICD-10-CM | POA: Diagnosis not present

## 2015-04-17 DIAGNOSIS — R609 Edema, unspecified: Secondary | ICD-10-CM | POA: Diagnosis not present

## 2015-04-17 DIAGNOSIS — I071 Rheumatic tricuspid insufficiency: Secondary | ICD-10-CM | POA: Diagnosis not present

## 2015-04-17 DIAGNOSIS — R600 Localized edema: Secondary | ICD-10-CM | POA: Diagnosis not present

## 2015-04-17 DIAGNOSIS — R0602 Shortness of breath: Secondary | ICD-10-CM | POA: Diagnosis not present

## 2015-04-17 DIAGNOSIS — I2510 Atherosclerotic heart disease of native coronary artery without angina pectoris: Secondary | ICD-10-CM | POA: Diagnosis not present

## 2015-04-17 DIAGNOSIS — I495 Sick sinus syndrome: Secondary | ICD-10-CM | POA: Diagnosis not present

## 2015-04-17 DIAGNOSIS — R42 Dizziness and giddiness: Secondary | ICD-10-CM | POA: Diagnosis not present

## 2015-04-17 DIAGNOSIS — I10 Essential (primary) hypertension: Secondary | ICD-10-CM | POA: Diagnosis not present

## 2015-04-17 DIAGNOSIS — I340 Nonrheumatic mitral (valve) insufficiency: Secondary | ICD-10-CM | POA: Diagnosis not present

## 2015-04-17 DIAGNOSIS — I4891 Unspecified atrial fibrillation: Secondary | ICD-10-CM | POA: Diagnosis not present

## 2015-04-17 DIAGNOSIS — E782 Mixed hyperlipidemia: Secondary | ICD-10-CM | POA: Diagnosis not present

## 2015-04-18 DIAGNOSIS — M25512 Pain in left shoulder: Secondary | ICD-10-CM | POA: Diagnosis not present

## 2015-04-18 DIAGNOSIS — S42202D Unspecified fracture of upper end of left humerus, subsequent encounter for fracture with routine healing: Secondary | ICD-10-CM | POA: Diagnosis not present

## 2015-04-18 LAB — PROTIME-INR: Protime: 2.4 seconds — AB (ref 10.0–13.8)

## 2015-04-19 ENCOUNTER — Encounter: Payer: Self-pay | Admitting: Internal Medicine

## 2015-04-19 DIAGNOSIS — M25512 Pain in left shoulder: Secondary | ICD-10-CM | POA: Diagnosis not present

## 2015-04-19 DIAGNOSIS — S42202D Unspecified fracture of upper end of left humerus, subsequent encounter for fracture with routine healing: Secondary | ICD-10-CM | POA: Diagnosis not present

## 2015-04-20 DIAGNOSIS — M25512 Pain in left shoulder: Secondary | ICD-10-CM | POA: Diagnosis not present

## 2015-04-20 DIAGNOSIS — S42202D Unspecified fracture of upper end of left humerus, subsequent encounter for fracture with routine healing: Secondary | ICD-10-CM | POA: Diagnosis not present

## 2015-04-24 DIAGNOSIS — M25512 Pain in left shoulder: Secondary | ICD-10-CM | POA: Diagnosis not present

## 2015-04-24 DIAGNOSIS — S42202D Unspecified fracture of upper end of left humerus, subsequent encounter for fracture with routine healing: Secondary | ICD-10-CM | POA: Diagnosis not present

## 2015-04-26 DIAGNOSIS — I4891 Unspecified atrial fibrillation: Secondary | ICD-10-CM | POA: Diagnosis not present

## 2015-04-27 DIAGNOSIS — S42202D Unspecified fracture of upper end of left humerus, subsequent encounter for fracture with routine healing: Secondary | ICD-10-CM | POA: Diagnosis not present

## 2015-04-27 DIAGNOSIS — M25512 Pain in left shoulder: Secondary | ICD-10-CM | POA: Diagnosis not present

## 2015-05-01 ENCOUNTER — Encounter: Payer: Self-pay | Admitting: Internal Medicine

## 2015-05-01 ENCOUNTER — Ambulatory Visit (INDEPENDENT_AMBULATORY_CARE_PROVIDER_SITE_OTHER): Payer: Medicare Other | Admitting: Internal Medicine

## 2015-05-01 VITALS — BP 115/66 | HR 87 | Temp 97.7°F | Ht 61.5 in | Wt 130.4 lb

## 2015-05-01 DIAGNOSIS — M79605 Pain in left leg: Secondary | ICD-10-CM | POA: Insufficient documentation

## 2015-05-01 DIAGNOSIS — R531 Weakness: Secondary | ICD-10-CM

## 2015-05-01 DIAGNOSIS — M79609 Pain in unspecified limb: Secondary | ICD-10-CM | POA: Diagnosis not present

## 2015-05-01 DIAGNOSIS — E559 Vitamin D deficiency, unspecified: Secondary | ICD-10-CM

## 2015-05-01 DIAGNOSIS — I4891 Unspecified atrial fibrillation: Secondary | ICD-10-CM | POA: Diagnosis not present

## 2015-05-01 DIAGNOSIS — Z79899 Other long term (current) drug therapy: Secondary | ICD-10-CM

## 2015-05-01 DIAGNOSIS — R5382 Chronic fatigue, unspecified: Secondary | ICD-10-CM

## 2015-05-01 DIAGNOSIS — E876 Hypokalemia: Secondary | ICD-10-CM

## 2015-05-01 LAB — POCT URINALYSIS DIPSTICK
Bilirubin, UA: NEGATIVE
Glucose, UA: NEGATIVE
Ketones, UA: NEGATIVE
Nitrite, UA: NEGATIVE
Protein, UA: NEGATIVE
Spec Grav, UA: <=1.005
Urobilinogen, UA: 0.2
pH, UA: 5

## 2015-05-01 LAB — VITAMIN D 25 HYDROXY (VIT D DEFICIENCY, FRACTURES): VITD: 17.22 ng/mL — ABNORMAL LOW (ref 30.00–100.00)

## 2015-05-01 LAB — COMPREHENSIVE METABOLIC PANEL
ALT: 14 U/L (ref 0–35)
AST: 18 U/L (ref 0–37)
Albumin: 3.6 g/dL (ref 3.5–5.2)
Alkaline Phosphatase: 67 U/L (ref 39–117)
BUN: 18 mg/dL (ref 6–23)
CO2: 28 mEq/L (ref 19–32)
Calcium: 8.8 mg/dL (ref 8.4–10.5)
Chloride: 102 mEq/L (ref 96–112)
Creatinine, Ser: 0.9 mg/dL (ref 0.40–1.20)
GFR: 62.43 mL/min (ref >60.00)
Glucose, Bld: 160 mg/dL — ABNORMAL HIGH (ref 70–99)
Potassium: 3.1 mEq/L — ABNORMAL LOW (ref 3.5–5.1)
Sodium: 140 mEq/L (ref 135–145)
Total Bilirubin: 1.4 mg/dL — ABNORMAL HIGH (ref 0.2–1.2)
Total Protein: 6.2 g/dL (ref 6.0–8.3)

## 2015-05-01 LAB — CBC WITH DIFFERENTIAL/PLATELET
Basophils Absolute: 0 10*3/uL (ref 0.0–0.1)
Basophils Relative: 0.4 % (ref 0.0–3.0)
Eosinophils Absolute: 0.1 10*3/uL (ref 0.0–0.7)
Eosinophils Relative: 1.1 % (ref 0.0–5.0)
HCT: 41.6 % (ref 36.0–46.0)
Hemoglobin: 13.8 g/dL (ref 12.0–15.0)
Lymphocytes Relative: 37.6 % (ref 12.0–46.0)
Lymphs Abs: 2.7 10*3/uL (ref 0.7–4.0)
MCHC: 33.1 g/dL (ref 30.0–36.0)
MCV: 88.2 fl (ref 78.0–100.0)
Monocytes Absolute: 0.4 10*3/uL (ref 0.1–1.0)
Monocytes Relative: 6 % (ref 3.0–12.0)
Neutro Abs: 4 10*3/uL (ref 1.4–7.7)
Neutrophils Relative %: 54.9 % (ref 43.0–77.0)
Platelets: 191 10*3/uL (ref 150.0–400.0)
RBC: 4.72 Mil/uL (ref 3.87–5.11)
RDW: 14.4 % (ref 11.5–15.5)
WBC: 7.2 10*3/uL (ref 4.0–10.5)

## 2015-05-01 LAB — TSH: TSH: 2.52 u[IU]/mL (ref 0.35–4.50)

## 2015-05-01 LAB — VITAMIN B12: Vitamin B-12: 604 pg/mL (ref 211–911)

## 2015-05-01 MED ORDER — POTASSIUM CHLORIDE CRYS ER 10 MEQ PO TBCR: 10.0000 meq | EXTENDED_RELEASE_TABLET | Freq: Every day | ORAL | Status: DC

## 2015-05-01 NOTE — Addendum Note (Signed)
Addended by: Ronette Deter A on: 05/01/2015 07:23 PM   Modules accepted: Orders

## 2015-05-01 NOTE — Progress Notes (Signed)
Pre visit review using our clinic review tool, if applicable. No additional management support is needed unless otherwise documented below in the visit note. 

## 2015-05-01 NOTE — Patient Instructions (Addendum)
Labs today.  We will set up physical therapy at home to help with balance and strengthening.  Please let us know if left thigh pain is not improving.  Follow up in 4 weeks.

## 2015-05-01 NOTE — Assessment & Plan Note (Signed)
AFIB on exam today. Dose of metoprolol recently reduced. Reviewed notes from Dr. Nehemiah Massed. Continue Coumadin. Goal INR 2-3.

## 2015-05-01 NOTE — Assessment & Plan Note (Signed)
Left lateral thigh pain. No palpable or visible abnormalities. Suspect she bruised this area. Will monitor for now. If symptoms not improving, discussed ordering a plain xray in the next few weeks.

## 2015-05-01 NOTE — Assessment & Plan Note (Signed)
Generalized weakness and fatigue. Evaluation in process, however likely multifactorial with recent sedentary state after left shoulder fracture. Will set up home PT to help with strengthening and balance training.

## 2015-05-01 NOTE — Assessment & Plan Note (Signed)
Chronic fatigue which is recently worsening. Exam normal except as noted. Fatigue may be multifactorial with recent left shoulder fracture and being more sedentary, as well as ongoing atrial fibrillation. Dose of Metoprolol was reduced. Will check CMP, CBC, TSH, and B12 with labs today.

## 2015-05-01 NOTE — Progress Notes (Signed)
Subjective:    Patient ID: Kara [name], female    DOB: [birth year], 81 y.o.   MRN: [id number]  HPI  80YO female presents for follow up.  Fatigue - Feeling very tired over last few weeks.  No focal symptoms, just fatigue. Seen by cardiology and dose of Metoprolol was decreased.  Appetite is good. No NVD. Has been more sedentary since left shoulder injury. No chest pain, dyspnea.  Has some darker urine and questions if she has UTI. No burning with urination, urgency or frequency. No fever, chills or flank pain.  Also having some pain left upper thigh. No injury noted. Sore over last few days. No weakness or numbness noted. No overlying skin changes. Not taking anything for this.  Compliant with medication.  Wt Readings from Last 3 Encounters:  05/01/15 130 lb 6 oz (59.138 kg)  01/29/15 129 lb 6 oz (58.684 kg)  11/21/14 131 lb 3.2 oz (59.512 kg)   BP Readings from Last 3 Encounters:  05/01/15 115/66  01/29/15 138/82  11/21/14 122/68    Past Medical History  Diagnosis Date  . Hypertension   . Atrial fibrillation ([place])   . CAD (coronary artery disease)   . Chronic diarrhea   . GERD (gastroesophageal reflux disease)   . Hyperlipidemia   . History of colon polyps   . Glaucoma   . Fibrocystic breast disease   . Cancer (HCC)     UTERINE  . Allergy   . IBS (irritable bowel syndrome)   . Plantar fasciitis   . Edema of lower extremity   . Sleep apnea, obstructive   . Hematuria   . History of pancreatitis   . Hypokalemia   . Hemorrhoids   . Osteoarthritis   . Decubitus ulcer     sacral region  . Pernicious anemia   . Vitamin D deficiency   . Chronic anticoagulation   . Skin cancer   . Dysuria   . Mitral valve regurgitation   . Diabetes ([place])   . Glaucoma   . Recurrent UTI   . Vaginal atrophy   . Yeast vaginitis   . Microscopic hematuria    Family History  Problem Relation Age of Onset  . Cancer Sister   . Diabetes    . Cancer Mother     COLON  . Coronary  artery disease Father   . Kidney disease Father   . Bladder Cancer Neg Hx    Past Surgical History  Procedure Laterality Date  . Breast biopsy  1970's  . Appendectomy  1940's  . Tonsillectomy  1936  . Abdominal hysterectomy  1980's  . Refractive surgery      for bilateral glaumoma  . Pacemaker placement    . Abdominal surgery      for villous polyp,,,many years ago  . Ascad, s/p ptca  11/28/2005    MID LESION   . Colectomy     Social History   Social History  . Marital Status: Married    Spouse Name: N/A  . Number of Children: 3  . Years of Education: N/A   Occupational History  . Retired Restaurant manager, fast food - primary    Social History Main Topics  . Smoking status: Former Research scientist (life sciences)  . Smokeless tobacco: Never Used     Comment: quit 45 years ago  . Alcohol Use: 0.0 oz/week    0 Standard drinks or equivalent per week     Comment: Rarely  . Drug Use: Yes  .  Sexual Activity: Not Asked   Other Topics Concern  . None   Social History Narrative   Lives at [place]. Born in [place]. Travels frequently.      1 daughter, 2 step children      Regular Exercise -  NO   Daily Caffeine Use:  1 coffee             Review of Systems  Constitutional: Positive for fatigue. Negative for fever, chills, appetite change and unexpected weight change.  HENT: Negative for congestion, sinus pressure, sneezing and sore throat.   Eyes: Negative for visual disturbance.  Respiratory: Negative for chest tightness and shortness of breath.   Cardiovascular: Negative for chest pain, palpitations and leg swelling.  Gastrointestinal: Negative for nausea, vomiting, abdominal pain, diarrhea, constipation and blood in stool.  Genitourinary: Negative for dysuria, urgency, frequency, hematuria, flank pain, difficulty urinating, vaginal pain and pelvic pain.  Musculoskeletal: Positive for myalgias and arthralgias.  Skin: Negative for color change and rash.  Neurological: Positive for  weakness. Negative for headaches.  Hematological: Negative for adenopathy. Does not bruise/bleed easily.  Psychiatric/Behavioral: Negative for sleep disturbance and dysphoric mood. The patient is not nervous/anxious.        Objective:    BP 115/66 mmHg  Pulse 87  Temp(Src) 97.7 F (36.5 C) (Oral)  Ht 5' 1.5" (1.562 m)  Wt 130 lb 6 oz (59.138 kg)  BMI 24.24 kg/m2  SpO2 96% Physical Exam  Constitutional: She is oriented to person, place, and time. She appears well-developed and well-nourished. No distress.  HENT:  Head: Normocephalic and atraumatic.  Right Ear: External ear normal.  Left Ear: External ear normal.  Nose: Nose normal.  Mouth/Throat: Oropharynx is clear and moist. No oropharyngeal exudate.  Eyes: Conjunctivae are normal. Pupils are equal, round, and reactive to light. Right eye exhibits no discharge. Left eye exhibits no discharge. No scleral icterus.  Neck: Normal range of motion. Neck supple. No tracheal deviation present. No thyromegaly present.  Cardiovascular: Normal rate, normal heart sounds and intact distal pulses.  An irregularly irregular rhythm present. Exam reveals no gallop and no friction rub.   No murmur heard. Pulmonary/Chest: Effort normal and breath sounds normal. No respiratory distress. She has no wheezes. She has no rales. She exhibits no tenderness.  Musculoskeletal: Normal range of motion. She exhibits no edema or tenderness.       Left hip: She exhibits normal range of motion, normal strength, no tenderness, no bony tenderness and no swelling.       Legs: Lymphadenopathy:    She has no cervical adenopathy.  Neurological: She is alert and oriented to person, place, and time. No cranial nerve deficit. She exhibits normal muscle tone. Coordination normal.  Skin: Skin is warm and dry. No rash noted. She is not diaphoretic. No erythema. No pallor.  Psychiatric: She has a normal mood and affect. Her behavior is normal. Judgment and thought content  normal.          Assessment & Plan:   Problem List Items Addressed This Visit      Unprioritized   Atrial fibrillation ([place]) - Primary    AFIB on exam today. Dose of metoprolol recently reduced. Reviewed notes from Dr. Nehemiah Massed. Continue Coumadin. Goal INR 2-3.      Chronic fatigue    Chronic fatigue which is recently worsening. Exam normal except as noted. Fatigue may be multifactorial with recent left shoulder fracture and being more sedentary, as well as ongoing  atrial fibrillation. Dose of Metoprolol was reduced. Will check CMP, CBC, TSH, and B12 with labs today.       Relevant Orders   CBC with Differential/Platelet   Comprehensive metabolic panel   VITAMIN D 25 Hydroxy (Vit-D Deficiency, Fractures)   TSH   B12   POCT Urinalysis Dipstick   Left leg pain    Left lateral thigh pain. No palpable or visible abnormalities. Suspect she bruised this area. Will monitor for now. If symptoms not improving, discussed ordering a plain xray in the next few weeks.      Weakness generalized    Generalized weakness and fatigue. Evaluation in process, however likely multifactorial with recent sedentary state after left shoulder fracture. Will set up home PT to help with strengthening and balance training.      Relevant Orders   Ambulatory referral to Physical Therapy       Return in about 4 weeks (around 05/29/2015) for Recheck.

## 2015-05-01 NOTE — Addendum Note (Signed)
Addended by: Karlene Einstein D on: 05/01/2015 10:38 AM   Modules accepted: Orders

## 2015-05-02 DIAGNOSIS — S42202D Unspecified fracture of upper end of left humerus, subsequent encounter for fracture with routine healing: Secondary | ICD-10-CM | POA: Diagnosis not present

## 2015-05-02 DIAGNOSIS — M25512 Pain in left shoulder: Secondary | ICD-10-CM | POA: Diagnosis not present

## 2015-05-02 LAB — POCT INR: INR: 2 — AB (ref <=1.1)

## 2015-05-04 ENCOUNTER — Telehealth: Payer: Self-pay | Admitting: *Deleted

## 2015-05-04 DIAGNOSIS — M25512 Pain in left shoulder: Secondary | ICD-10-CM | POA: Diagnosis not present

## 2015-05-04 DIAGNOSIS — S42202D Unspecified fracture of upper end of left humerus, subsequent encounter for fracture with routine healing: Secondary | ICD-10-CM | POA: Diagnosis not present

## 2015-05-04 LAB — URINE CULTURE: Colony Count: >=100000

## 2015-05-07 ENCOUNTER — Telehealth: Payer: Self-pay | Admitting: *Deleted

## 2015-05-07 ENCOUNTER — Other Ambulatory Visit: Payer: Self-pay | Admitting: *Deleted

## 2015-05-07 ENCOUNTER — Encounter: Payer: Self-pay | Admitting: Internal Medicine

## 2015-05-07 DIAGNOSIS — N390 Urinary tract infection, site not specified: Secondary | ICD-10-CM

## 2015-05-07 DIAGNOSIS — Z139 Encounter for screening, unspecified: Secondary | ICD-10-CM

## 2015-05-07 NOTE — Telephone Encounter (Signed)
Patient stated that she received a call, on Friday, however she missed the call. Patient stated that the call may have been in refrence to her urine culture. Please advise. A detail message can be left on voicemail

## 2015-05-08 ENCOUNTER — Telehealth: Payer: Self-pay | Admitting: Internal Medicine

## 2015-05-08 DIAGNOSIS — S42202D Unspecified fracture of upper end of left humerus, subsequent encounter for fracture with routine healing: Secondary | ICD-10-CM | POA: Diagnosis not present

## 2015-05-08 DIAGNOSIS — M25512 Pain in left shoulder: Secondary | ICD-10-CM | POA: Diagnosis not present

## 2015-05-08 NOTE — Telephone Encounter (Signed)
Left detailed message on her VM yesterday per her request, see result note.

## 2015-05-08 NOTE — Telephone Encounter (Signed)
Pt called about the potassium she is not tolerable of it. It kept her going to the bathroom. Medication is potassium chloride (K-DUR,KLOR-CON) 10 MEQ tablet. Pt wants to know if she can take something else? And pt needs to know her results for her urine culture on 05/07/2015. Thank You!

## 2015-05-08 NOTE — Telephone Encounter (Signed)
Please advise 

## 2015-05-09 ENCOUNTER — Other Ambulatory Visit: Payer: Self-pay

## 2015-05-09 DIAGNOSIS — E876 Hypokalemia: Secondary | ICD-10-CM

## 2015-05-09 LAB — URINE CULTURE: Colony Count: 15000

## 2015-05-09 LAB — PROTIME-INR: INR: 1.7 — AB (ref 0.9–1.1)

## 2015-05-09 MED ORDER — POTASSIUM CHLORIDE CRYS ER 10 MEQ PO TBCR
10.0000 meq | EXTENDED_RELEASE_TABLET | Freq: Every day | ORAL | Status: DC
Start: 2015-05-09 — End: 2015-05-29

## 2015-05-09 NOTE — Telephone Encounter (Signed)
Spoke with the patient, reviewed her labs again with her and then discussed the Potassium supplements.  Patient was prescribed these same supplements two other times and both times it made her stomach a mess.  She is requesting an alternative.  Currently she has the large horse pills, can we get her either there enteric coated ones or liquid form to help her stomach tolerate?  She is refusing to take the ones that she was given.  Return call to [phone] as she is out with family today.    Please advise.

## 2015-05-09 NOTE — Telephone Encounter (Signed)
Spoke with patient, she will pick up the medication and she understands that all Potassium supplements will upset her stomach.

## 2015-05-09 NOTE — Telephone Encounter (Signed)
OK. All oral potassium supplements can upset the stomach. I don't know of an enteric one. She can try increasing potassium in her diet and we can recheck BMP in 1-2 weeks.

## 2015-05-10 DIAGNOSIS — S42202D Unspecified fracture of upper end of left humerus, subsequent encounter for fracture with routine healing: Secondary | ICD-10-CM | POA: Diagnosis not present

## 2015-05-10 DIAGNOSIS — M25512 Pain in left shoulder: Secondary | ICD-10-CM | POA: Diagnosis not present

## 2015-05-11 ENCOUNTER — Encounter: Payer: Self-pay | Admitting: Internal Medicine

## 2015-05-15 ENCOUNTER — Telehealth: Payer: Self-pay | Admitting: Internal Medicine

## 2015-05-15 DIAGNOSIS — S42202D Unspecified fracture of upper end of left humerus, subsequent encounter for fracture with routine healing: Secondary | ICD-10-CM | POA: Diagnosis not present

## 2015-05-15 DIAGNOSIS — M25512 Pain in left shoulder: Secondary | ICD-10-CM | POA: Diagnosis not present

## 2015-05-15 NOTE — Telephone Encounter (Signed)
Out of range INR of 1.7. Please advise?

## 2015-05-16 ENCOUNTER — Encounter: Payer: Self-pay | Admitting: *Deleted

## 2015-05-16 NOTE — Telephone Encounter (Signed)
Just continue coumadin same dose repeat INR in 1 week. She checks home INR and there can be some variability

## 2015-05-17 ENCOUNTER — Telehealth: Payer: Self-pay | Admitting: *Deleted

## 2015-05-17 NOTE — Telephone Encounter (Signed)
i have sent this to you already. Patient told me of this number.

## 2015-05-17 NOTE — Telephone Encounter (Signed)
Patient will due it next Wednesday. She stated she did it 05/16/15 and stated her number was 1.6

## 2015-05-17 NOTE — Telephone Encounter (Signed)
[facility] called with patient's INR results; 1.6

## 2015-05-17 NOTE — Telephone Encounter (Signed)
LVTCB

## 2015-05-20 DIAGNOSIS — S42202D Unspecified fracture of upper end of left humerus, subsequent encounter for fracture with routine healing: Secondary | ICD-10-CM | POA: Diagnosis not present

## 2015-05-20 DIAGNOSIS — M25512 Pain in left shoulder: Secondary | ICD-10-CM | POA: Diagnosis not present

## 2015-05-21 ENCOUNTER — Other Ambulatory Visit: Payer: Self-pay | Admitting: Internal Medicine

## 2015-05-22 NOTE — Telephone Encounter (Signed)
Please advise refill, was given #90 at end of November? This is a PRN drug.

## 2015-05-23 ENCOUNTER — Ambulatory Visit: Payer: Medicare Other

## 2015-05-23 DIAGNOSIS — I4891 Unspecified atrial fibrillation: Secondary | ICD-10-CM | POA: Diagnosis not present

## 2015-05-23 LAB — POCT INR: INR: 1.6 — AB (ref 0.9–1.1)

## 2015-05-25 DIAGNOSIS — M25512 Pain in left shoulder: Secondary | ICD-10-CM | POA: Diagnosis not present

## 2015-05-25 DIAGNOSIS — S42202D Unspecified fracture of upper end of left humerus, subsequent encounter for fracture with routine healing: Secondary | ICD-10-CM | POA: Diagnosis not present

## 2015-05-29 ENCOUNTER — Encounter: Payer: Self-pay | Admitting: Internal Medicine

## 2015-05-29 ENCOUNTER — Ambulatory Visit (INDEPENDENT_AMBULATORY_CARE_PROVIDER_SITE_OTHER): Payer: Medicare Other | Admitting: Internal Medicine

## 2015-05-29 VITALS — BP 127/71 | HR 86 | Temp 97.6°F | Ht 61.5 in | Wt 130.0 lb

## 2015-05-29 DIAGNOSIS — M79605 Pain in left leg: Secondary | ICD-10-CM

## 2015-05-29 DIAGNOSIS — E876 Hypokalemia: Secondary | ICD-10-CM

## 2015-05-29 DIAGNOSIS — R739 Hyperglycemia, unspecified: Secondary | ICD-10-CM

## 2015-05-29 DIAGNOSIS — R7309 Other abnormal glucose: Secondary | ICD-10-CM | POA: Diagnosis not present

## 2015-05-29 DIAGNOSIS — E119 Type 2 diabetes mellitus without complications: Secondary | ICD-10-CM | POA: Insufficient documentation

## 2015-05-29 DIAGNOSIS — G5601 Carpal tunnel syndrome, right upper limb: Secondary | ICD-10-CM | POA: Insufficient documentation

## 2015-05-29 DIAGNOSIS — I4891 Unspecified atrial fibrillation: Secondary | ICD-10-CM | POA: Diagnosis not present

## 2015-05-29 LAB — BASIC METABOLIC PANEL
BUN: 16 mg/dL (ref 6–23)
CO2: 28 mEq/L (ref 19–32)
Calcium: 9.2 mg/dL (ref 8.4–10.5)
Chloride: 103 mEq/L (ref 96–112)
Creatinine, Ser: 0.83 mg/dL (ref 0.40–1.20)
GFR: 68.54 mL/min (ref >60.00)
Glucose, Bld: 148 mg/dL — ABNORMAL HIGH (ref 70–99)
Potassium: 3.6 mEq/L (ref 3.5–5.1)
Sodium: 140 mEq/L (ref 135–145)

## 2015-05-29 LAB — PROTIME-INR
INR: 2.3 ratio — ABNORMAL HIGH (ref 0.8–1.0)
Prothrombin Time: 24.4 s — ABNORMAL HIGH (ref 9.6–13.1)

## 2015-05-29 LAB — HEMOGLOBIN A1C: Hgb A1c MFr Bld: 6.4 % (ref 4.6–6.5)

## 2015-05-29 LAB — MAGNESIUM: Magnesium: 1.9 mg/dL (ref 1.5–2.5)

## 2015-05-29 MED ORDER — POTASSIUM CHLORIDE CRYS ER 10 MEQ PO TBCR: 10.0000 meq | EXTENDED_RELEASE_TABLET | Freq: Every day | ORAL | Status: DC

## 2015-05-29 NOTE — Assessment & Plan Note (Signed)
Will check BMP and Mag with labs.

## 2015-05-29 NOTE — Progress Notes (Signed)
Pre visit review using our clinic review tool, if applicable. No additional management support is needed unless otherwise documented below in the visit note. 

## 2015-05-29 NOTE — Patient Instructions (Addendum)
Repeat INR as scheduled tomorrow.  Labs today.  We will schedule ultrasound of left leg.  Please let us know if you would like to proceed with further evaluation of right hand weakness.

## 2015-05-29 NOTE — Addendum Note (Signed)
Addended by: Ronette Deter A on: 05/29/2015 11:58 AM   Modules accepted: Orders

## 2015-05-29 NOTE — Assessment & Plan Note (Signed)
Afib on exam. Rate controlled with Diltiazem. Anticoagulated with coumadin. Will check INR with labs today. She also monitors INR at home with [facility].

## 2015-05-29 NOTE — Assessment & Plan Note (Signed)
Left lateral lower thigh pain and palpable nodular area. Most consistent with scar tissue. Question if she may have had a hematoma with fall in August. Will get [place] evaluation.

## 2015-05-29 NOTE — Assessment & Plan Note (Signed)
Symptoms are most consistent with carpal tunnel syndrome right hand. Discussed referral for further testing and evaluation. She prefers to hold off for now.

## 2015-05-29 NOTE — Progress Notes (Signed)
Subjective:    Patient ID: Kara [name], female    DOB: 1922/12/13, 82 y.o.   MRN: [id number]  HPI  82YO female presents for follow up.  Left lateral thigh pain - Started after fall in August. Sore, nodular area in left lateral lower thigh. Not taking anything for pain. No change in size of area.  Diarrhea - no major changes recently. Taking Lomotil with improvement.  Afib - compliant with medications. Recent INR has been on lower side.   Right hand tingling and decreased grip strength - Noted over last several months. Worried this may be Carpal Tunnel. Not ready to consider surgery.  Wt Readings from Last 3 Encounters:  05/29/15 130 lb (58.968 kg)  05/01/15 130 lb 6 oz (59.138 kg)  01/29/15 129 lb 6 oz (58.684 kg)   BP Readings from Last 3 Encounters:  05/29/15 127/71  05/01/15 115/66  01/29/15 138/82    Past Medical History  Diagnosis Date  . Hypertension   . Atrial fibrillation ([place])   . CAD (coronary artery disease)   . Chronic diarrhea   . GERD (gastroesophageal reflux disease)   . Hyperlipidemia   . History of colon polyps   . Glaucoma   . Fibrocystic breast disease   . Cancer (HCC)     UTERINE  . Allergy   . IBS (irritable bowel syndrome)   . Plantar fasciitis   . Edema of lower extremity   . Sleep apnea, obstructive   . Hematuria   . History of pancreatitis   . Hypokalemia   . Hemorrhoids   . Osteoarthritis   . Decubitus ulcer     sacral region  . Pernicious anemia   . Vitamin D deficiency   . Chronic anticoagulation   . Skin cancer   . Dysuria   . Mitral valve regurgitation   . Diabetes ([place])   . Glaucoma   . Recurrent UTI   . Vaginal atrophy   . Yeast vaginitis   . Microscopic hematuria    Family History  Problem Relation Age of Onset  . Cancer Sister   . Diabetes    . Cancer Mother     COLON  . Coronary artery disease Father   . Kidney disease Father   . Bladder Cancer Neg Hx    Past Surgical History  Procedure Laterality Date    . Breast biopsy  1970's  . Appendectomy  1940's  . Tonsillectomy  1936  . Abdominal hysterectomy  1980's  . Refractive surgery      for bilateral glaumoma  . Pacemaker placement    . Abdominal surgery      for villous polyp,,,many years ago  . Ascad, s/p ptca  11/28/2005    MID LESION   . Colectomy     Social History   Social History  . Marital Status: Married    Spouse Name: N/A  . Number of Children: 3  . Years of Education: N/A   Occupational History  . Retired Restaurant manager, fast food - primary    Social History Main Topics  . Smoking status: Former Research scientist (life sciences)  . Smokeless tobacco: Never Used     Comment: quit 45 years ago  . Alcohol Use: 0.0 oz/week    0 Standard drinks or equivalent per week     Comment: Rarely  . Drug Use: Yes  . Sexual Activity: Not Asked   Other Topics Concern  . None   Social History Narrative   Lives at [facility]  of [place]. Born in [place]. Travels frequently.      1 daughter, 2 step children      Regular Exercise -  NO   Daily Caffeine Use:  1 coffee             Review of Systems  Constitutional: Positive for fatigue. Negative for fever, chills, appetite change and unexpected weight change.  Eyes: Negative for visual disturbance.  Respiratory: Negative for cough and shortness of breath.   Cardiovascular: Positive for palpitations. Negative for chest pain and leg swelling.  Gastrointestinal: Positive for diarrhea. Negative for nausea, vomiting, abdominal pain and constipation.  Musculoskeletal: Positive for myalgias and arthralgias.  Skin: Negative for color change and rash.  Neurological: Positive for weakness and numbness.  Hematological: Negative for adenopathy. Does not bruise/bleed easily.  Psychiatric/Behavioral: Negative for sleep disturbance and dysphoric mood. The patient is not nervous/anxious.        Objective:    BP 127/71 mmHg  Pulse 86  Temp(Src) 97.6 F (36.4 C) (Oral)  Ht 5' 1.5" (1.562 m)  Wt 130 lb (58.968  kg)  BMI 24.17 kg/m2  SpO2 97% Physical Exam  Constitutional: She is oriented to person, place, and time. She appears well-developed and well-nourished. No distress.  HENT:  Head: Normocephalic and atraumatic.  Right Ear: External ear normal.  Left Ear: External ear normal.  Nose: Nose normal.  Mouth/Throat: Oropharynx is clear and moist. No oropharyngeal exudate.  Eyes: Conjunctivae are normal. Pupils are equal, round, and reactive to light. Right eye exhibits no discharge. Left eye exhibits no discharge. No scleral icterus.  Neck: Normal range of motion. Neck supple. No tracheal deviation present. No thyromegaly present.  Cardiovascular: Normal rate, normal heart sounds and intact distal pulses.  An irregularly irregular rhythm present. Exam reveals no gallop and no friction rub.   No murmur heard. Pulmonary/Chest: Effort normal and breath sounds normal. No respiratory distress. She has no wheezes. She has no rales. She exhibits no tenderness.  Musculoskeletal: Normal range of motion. She exhibits no edema or tenderness.  Lymphadenopathy:    She has no cervical adenopathy.  Neurological: She is alert and oriented to person, place, and time. She displays no atrophy. No cranial nerve deficit or sensory deficit. She exhibits normal muscle tone. Coordination and gait normal.  Described numbness over right thumb and first two fingers. No abnormalities on current exam.  Skin: Skin is warm and dry. No rash noted. She is not diaphoretic. No erythema. No pallor.     Psychiatric: She has a normal mood and affect. Her behavior is normal. Judgment and thought content normal.          Assessment & Plan:   Problem List Items Addressed This Visit      Unprioritized   Atrial fibrillation ([place])    Afib on exam. Rate controlled with Diltiazem. Anticoagulated with coumadin. Will check INR with labs today. She also monitors INR at home with [facility].      Relevant Orders   Protime-INR   Carpal  tunnel syndrome of right wrist    Symptoms are most consistent with carpal tunnel syndrome right hand. Discussed referral for further testing and evaluation. She prefers to hold off for now.       Elevated blood sugar    BG non-fasting 140-160s on labs. Will check A1c with labs today.      Relevant Orders   Hemoglobin A1c   Hypokalemia    Will check BMP and Mag with labs.  Relevant Medications   potassium chloride (K-DUR,KLOR-CON) 10 MEQ tablet   Other Relevant Orders   Basic Metabolic Panel (BMET)   Magnesium   Left leg pain - Primary    Left lateral lower thigh pain and palpable nodular area. Most consistent with scar tissue. Question if she may have had a hematoma with fall in August. Will get [place] evaluation.      Relevant Orders   [place] Misc Soft Tissue       Return in about 4 weeks (around 06/26/2015) for Recheck.

## 2015-05-29 NOTE — Assessment & Plan Note (Signed)
BG non-fasting 140-160s on labs. Will check A1c with labs today.

## 2015-05-30 ENCOUNTER — Other Ambulatory Visit: Payer: Self-pay | Admitting: *Deleted

## 2015-05-30 DIAGNOSIS — E876 Hypokalemia: Secondary | ICD-10-CM

## 2015-05-30 DIAGNOSIS — M25512 Pain in left shoulder: Secondary | ICD-10-CM | POA: Diagnosis not present

## 2015-05-30 DIAGNOSIS — S42202D Unspecified fracture of upper end of left humerus, subsequent encounter for fracture with routine healing: Secondary | ICD-10-CM | POA: Diagnosis not present

## 2015-05-30 LAB — PROTIME-INR: INR: 1.9 — AB (ref 0.9–1.1)

## 2015-05-30 MED ORDER — POTASSIUM CHLORIDE CRYS ER 10 MEQ PO TBCR: 10.0000 meq | EXTENDED_RELEASE_TABLET | Freq: Every day | ORAL | Status: DC

## 2015-05-30 NOTE — Telephone Encounter (Signed)
Reprinted Rx to change refills to 6, in lieu of 0.

## 2015-05-31 ENCOUNTER — Telehealth: Payer: Self-pay | Admitting: *Deleted

## 2015-05-31 NOTE — Telephone Encounter (Signed)
Patient has requested her lab results from 05/29/15 Contact [phone]

## 2015-05-31 NOTE — Telephone Encounter (Signed)
Tiffany with [facility] called with INR result of 1.9 test that was done yesterday.

## 2015-05-31 NOTE — Telephone Encounter (Signed)
Spoke with patient, see result note

## 2015-06-01 DIAGNOSIS — M25512 Pain in left shoulder: Secondary | ICD-10-CM | POA: Diagnosis not present

## 2015-06-01 DIAGNOSIS — S42202D Unspecified fracture of upper end of left humerus, subsequent encounter for fracture with routine healing: Secondary | ICD-10-CM | POA: Diagnosis not present

## 2015-06-05 DIAGNOSIS — S42202D Unspecified fracture of upper end of left humerus, subsequent encounter for fracture with routine healing: Secondary | ICD-10-CM | POA: Diagnosis not present

## 2015-06-05 DIAGNOSIS — M25512 Pain in left shoulder: Secondary | ICD-10-CM | POA: Diagnosis not present

## 2015-06-06 ENCOUNTER — Ambulatory Visit
Admission: RE | Admit: 2015-06-06 | Discharge: 2015-06-06 | Disposition: A | Discharge status: Home / Self Care | Payer: Medicare Other | Source: Ambulatory Visit | Attending: Internal Medicine | Admitting: Internal Medicine

## 2015-06-06 DIAGNOSIS — M79605 Pain in left leg: Secondary | ICD-10-CM | POA: Insufficient documentation

## 2015-06-06 DIAGNOSIS — R109 Unspecified abdominal pain: Secondary | ICD-10-CM | POA: Diagnosis not present

## 2015-06-08 DIAGNOSIS — M25512 Pain in left shoulder: Secondary | ICD-10-CM | POA: Diagnosis not present

## 2015-06-08 DIAGNOSIS — S42202D Unspecified fracture of upper end of left humerus, subsequent encounter for fracture with routine healing: Secondary | ICD-10-CM | POA: Diagnosis not present

## 2015-06-13 DIAGNOSIS — M25512 Pain in left shoulder: Secondary | ICD-10-CM | POA: Diagnosis not present

## 2015-06-13 DIAGNOSIS — S42202D Unspecified fracture of upper end of left humerus, subsequent encounter for fracture with routine healing: Secondary | ICD-10-CM | POA: Diagnosis not present

## 2015-06-14 LAB — PROTIME-INR: INR: 2.1 — AB (ref 0.9–1.1)

## 2015-06-15 ENCOUNTER — Encounter: Payer: Self-pay | Admitting: Family Medicine

## 2015-06-15 ENCOUNTER — Ambulatory Visit (INDEPENDENT_AMBULATORY_CARE_PROVIDER_SITE_OTHER): Payer: Medicare Other | Admitting: Family Medicine

## 2015-06-15 DIAGNOSIS — R82998 Other abnormal findings in urine: Secondary | ICD-10-CM | POA: Insufficient documentation

## 2015-06-15 DIAGNOSIS — R8299 Other abnormal findings in urine: Secondary | ICD-10-CM | POA: Diagnosis not present

## 2015-06-15 LAB — POC URINALSYSI DIPSTICK (AUTOMATED)
Bilirubin, UA: NEGATIVE
Glucose, UA: NEGATIVE
Ketones, UA: NEGATIVE
Nitrite, UA: NEGATIVE
Protein, UA: 100
Spec Grav, UA: 1.015
Urobilinogen, UA: 0.2
pH, UA: 6.5

## 2015-06-15 NOTE — Assessment & Plan Note (Signed)
New problem. Urine dipstick with large blood and small leukocytes. Patient asymptomatic. Sending for micro and culture. Holding off on antibiotic therapy until culture returns given that patient is well-appearing and asymptomatic.

## 2015-06-15 NOTE — Progress Notes (Signed)
Subjective:  Patient ID: Kara [name], female    DOB: June 30, 1922  Age: 82 y.o. MRN: [id number]  CC: Dark urine  HPI:  82 year old female with chronic afib on anticoagulation and history of UTI presents with complaints of dark urine.  Patient states that she woke up this morning and had dark orange colored urine. She denies any associated dysuria, frequency, urgency. She states that she has had some low back pain but this is been mild and she is unsure of his related. No associated fevers or chills. She feels fine otherwise. No exacerbating or relieving factors.  Social Hx   Social History   Social History  . Marital Status: Married    Spouse Name: N/A  . Number of Children: 3  . Years of Education: N/A   Occupational History  . Retired Restaurant manager, fast food - primary    Social History Main Topics  . Smoking status: Former Research scientist (life sciences)  . Smokeless tobacco: Never Used     Comment: quit 45 years ago  . Alcohol Use: 0.0 oz/week    0 Standard drinks or equivalent per week     Comment: Rarely  . Drug Use: Yes  . Sexual Activity: Not Asked   Other Topics Concern  . None   Social History Narrative   Lives at [place]. Born in [place]. Travels frequently.      1 daughter, 2 step children      Regular Exercise -  NO   Daily Caffeine Use:  1 coffee            Review of Systems  Constitutional: Negative.   Genitourinary: Negative for dysuria, urgency and frequency.   Objective:  BP 124/78 mmHg  Pulse 82  Temp(Src) 97.8 F (36.6 C) (Oral)  Ht 5' 1.5" (1.562 m)  Wt 131 lb 6 oz (59.591 kg)  BMI 24.42 kg/m2  SpO2 97%  BP/Weight 06/15/2015 05/29/2015 0000000  Systolic BP [id number]  Diastolic BP 78 71 66  Wt. (Lbs) 131.38 130 130.38  BMI 24.42 24.17 24.24   Physical Exam  Constitutional: She appears well-developed. No distress.  Cardiovascular: An irregularly irregular rhythm present.  Pulmonary/Chest: Effort normal and breath sounds normal.  Abdominal:  Soft. She exhibits no distension. There is no tenderness. There is no rebound and no guarding.  Neurological: She is alert.  Psychiatric: She has a normal mood and affect.  Vitals reviewed.  Lab Results  Component Value Date   WBC 7.2 05/01/2015   HGB 13.8 05/01/2015   HCT 41.6 05/01/2015   PLT 191.0 05/01/2015   GLUCOSE 148* 05/29/2015   CHOL 142 10/11/2013   TRIG 194.0* 10/11/2013   HDL 43.10 10/11/2013   LDLDIRECT 86.3 06/30/2012   LDLCALC 60 10/11/2013   ALT 14 05/01/2015   AST 18 05/01/2015   NA 140 05/29/2015   K 3.6 05/29/2015   CL 103 05/29/2015   CREATININE 0.83 05/29/2015   BUN 16 05/29/2015   CO2 28 05/29/2015   TSH 2.52 05/01/2015   INR 1.9* 05/30/2015   HGBA1C 6.4 05/29/2015   MICROALBUR 0.1 12/16/2012   Results for orders placed or performed in visit on 06/15/15 (from the past 24 hour(s))  POCT Urinalysis Dipstick (Automated)     Status: Abnormal   Collection Time: 06/15/15  2:51 PM  Result Value Ref Range   Color, UA dark orange    Clarity, UA cloudy    Glucose, UA neg    Bilirubin, UA neg  Ketones, UA neg    Spec Grav, UA 1.015    Blood, UA large    pH, UA 6.5    Protein, UA 100    Urobilinogen, UA 0.2    Nitrite, UA neg    Leukocytes, UA small (1+) (A) Negative   Assessment & Plan:   Problem List Items Addressed This Visit    Dark urine    New problem. Urine dipstick with large blood and small leukocytes. Patient asymptomatic. Sending for micro and culture. Holding off on antibiotic therapy until culture returns given that patient is well-appearing and asymptomatic.      Relevant Orders   POCT Urinalysis Dipstick (Automated) (Completed)   Urine Microscopic Only   Urine culture     Follow-up: PRN  [place]

## 2015-06-15 NOTE — Addendum Note (Signed)
Addended by: Karlene Einstein D on: 06/15/2015 04:15 PM   Modules accepted: Orders

## 2015-06-15 NOTE — Patient Instructions (Signed)
No antibiotics until the culture reports.  Call if you worsen (fever, abdominal pain, etc).  We will call with the results.  Stay hydrated.  Take care  Dr. Lacinda Axon

## 2015-06-16 LAB — URINALYSIS, MICROSCOPIC ONLY
Bacteria, UA: NONE SEEN [HPF]
Casts: NONE SEEN [LPF]
Crystals: NONE SEEN [HPF]
RBC / HPF: >60 RBC/HPF — AB (ref <=2)
Yeast: NONE SEEN [HPF]

## 2015-06-17 LAB — URINE CULTURE
Colony Count: NO GROWTH
Organism ID, Bacteria: NO GROWTH

## 2015-06-18 DIAGNOSIS — M25512 Pain in left shoulder: Secondary | ICD-10-CM | POA: Diagnosis not present

## 2015-06-18 DIAGNOSIS — S42202D Unspecified fracture of upper end of left humerus, subsequent encounter for fracture with routine healing: Secondary | ICD-10-CM | POA: Diagnosis not present

## 2015-06-20 DIAGNOSIS — I2510 Atherosclerotic heart disease of native coronary artery without angina pectoris: Secondary | ICD-10-CM | POA: Diagnosis not present

## 2015-06-20 DIAGNOSIS — I340 Nonrheumatic mitral (valve) insufficiency: Secondary | ICD-10-CM | POA: Diagnosis not present

## 2015-06-20 DIAGNOSIS — M25512 Pain in left shoulder: Secondary | ICD-10-CM | POA: Diagnosis not present

## 2015-06-20 DIAGNOSIS — S42202D Unspecified fracture of upper end of left humerus, subsequent encounter for fracture with routine healing: Secondary | ICD-10-CM | POA: Diagnosis not present

## 2015-06-20 DIAGNOSIS — R0602 Shortness of breath: Secondary | ICD-10-CM | POA: Diagnosis not present

## 2015-06-20 DIAGNOSIS — I495 Sick sinus syndrome: Secondary | ICD-10-CM | POA: Diagnosis not present

## 2015-06-21 ENCOUNTER — Other Ambulatory Visit: Payer: Self-pay | Admitting: Internal Medicine

## 2015-06-21 DIAGNOSIS — M25512 Pain in left shoulder: Secondary | ICD-10-CM | POA: Diagnosis not present

## 2015-06-21 DIAGNOSIS — S42202D Unspecified fracture of upper end of left humerus, subsequent encounter for fracture with routine healing: Secondary | ICD-10-CM | POA: Diagnosis not present

## 2015-06-22 DIAGNOSIS — I4891 Unspecified atrial fibrillation: Secondary | ICD-10-CM | POA: Diagnosis not present

## 2015-06-22 LAB — PROTIME-INR

## 2015-06-26 ENCOUNTER — Encounter: Payer: Self-pay | Admitting: *Deleted

## 2015-06-26 ENCOUNTER — Ambulatory Visit (INDEPENDENT_AMBULATORY_CARE_PROVIDER_SITE_OTHER): Payer: Medicare Other | Admitting: Internal Medicine

## 2015-06-26 ENCOUNTER — Encounter: Payer: Self-pay | Admitting: Internal Medicine

## 2015-06-26 VITALS — BP 123/68 | HR 82 | Temp 98.1°F | Ht 61.5 in | Wt 131.0 lb

## 2015-06-26 DIAGNOSIS — M25512 Pain in left shoulder: Secondary | ICD-10-CM | POA: Diagnosis not present

## 2015-06-26 DIAGNOSIS — S42202D Unspecified fracture of upper end of left humerus, subsequent encounter for fracture with routine healing: Secondary | ICD-10-CM | POA: Diagnosis not present

## 2015-06-26 DIAGNOSIS — I4891 Unspecified atrial fibrillation: Secondary | ICD-10-CM | POA: Diagnosis not present

## 2015-06-26 DIAGNOSIS — R7309 Other abnormal glucose: Secondary | ICD-10-CM | POA: Diagnosis not present

## 2015-06-26 DIAGNOSIS — R739 Hyperglycemia, unspecified: Secondary | ICD-10-CM

## 2015-06-26 DIAGNOSIS — K429 Umbilical hernia without obstruction or gangrene: Secondary | ICD-10-CM

## 2015-06-26 DIAGNOSIS — M79605 Pain in left leg: Secondary | ICD-10-CM

## 2015-06-26 HISTORY — DX: Umbilical hernia without obstruction or gangrene: K42.9

## 2015-06-26 MED ORDER — HYDROCODONE-ACETAMINOPHEN 5-325 MG PO TABS: 1.0000 | ORAL_TABLET | Freq: Three times a day (TID) | ORAL | Status: DC | PRN

## 2015-06-26 NOTE — Progress Notes (Signed)
Subjective:    Patient ID: Kara [name], female    DOB: 1922-10-13, 82 y.o.   MRN: [id number]  HPI  82YO female presents for follow up.  Last seen in 05/2015 for left leg pain.  Still having some days in which she feels tired.  Seen by Dr. Lacinda Axon last week for "dark urine." Urine culture was negative. Symptoms improved with increased fluid intake.  Worried she may have peripheral neuropathy. Saw an ad for Chiropractic care for this. Occasionally has pain in her feet. Described as sharp, like glass. This pain resolved. She did not take anything for pain. She has h/o pre-diabetes.  Recent B12 was normal and she continues on B12 injections.  Left lateral thigh pain - Improving. Only hurts with deep palpation at times.  Umbilical hernia - Recurrent. Repaired in past by Dr. Jamal Collin. Not painful, just notes a bulge.  AFIB - Scheduled for ECHO next week with cardiology  Wt Readings from Last 3 Encounters:  06/26/15 131 lb (59.421 kg)  06/15/15 131 lb 6 oz (59.591 kg)  05/29/15 130 lb (58.968 kg)   BP Readings from Last 3 Encounters:  06/26/15 123/68  06/15/15 124/78  05/29/15 127/71    Past Medical History  Diagnosis Date  . Hypertension   . Atrial fibrillation ([place])   . CAD (coronary artery disease)   . Chronic diarrhea   . GERD (gastroesophageal reflux disease)   . Hyperlipidemia   . History of colon polyps   . Glaucoma   . Fibrocystic breast disease   . Cancer (HCC)     UTERINE  . Allergy   . IBS (irritable bowel syndrome)   . Plantar fasciitis   . Edema of lower extremity   . Sleep apnea, obstructive   . Hematuria   . History of pancreatitis   . Hypokalemia   . Hemorrhoids   . Osteoarthritis   . Decubitus ulcer     sacral region  . Pernicious anemia   . Vitamin D deficiency   . Chronic anticoagulation   . Skin cancer   . Dysuria   . Mitral valve regurgitation   . Diabetes ([place])   . Glaucoma   . Recurrent UTI   . Vaginal atrophy   . Yeast vaginitis     . Microscopic hematuria    Family History  Problem Relation Age of Onset  . Cancer Sister   . Diabetes    . Cancer Mother     COLON  . Coronary artery disease Father   . Kidney disease Father   . Bladder Cancer Neg Hx    Past Surgical History  Procedure Laterality Date  . Breast biopsy  1970's  . Appendectomy  1940's  . Tonsillectomy  1936  . Abdominal hysterectomy  1980's  . Refractive surgery      for bilateral glaumoma  . Pacemaker placement    . Abdominal surgery      for villous polyp,,,many years ago  . Ascad, s/p ptca  11/28/2005    MID LESION   . Colectomy     Social History   Social History  . Marital Status: Married    Spouse Name: N/A  . Number of Children: 3  . Years of Education: N/A   Occupational History  . Retired Restaurant manager, fast food - primary    Social History Main Topics  . Smoking status: Former Research scientist (life sciences)  . Smokeless tobacco: Never Used     Comment: quit 45 years ago  .  Alcohol Use: 0.0 oz/week    0 Standard drinks or equivalent per week     Comment: Rarely  . Drug Use: Yes  . Sexual Activity: Not Asked   Other Topics Concern  . None   Social History Narrative   Lives at [place]. Born in [place]. Travels frequently.      1 daughter, 2 step children      Regular Exercise -  NO   Daily Caffeine Use:  1 coffee             Review of Systems  Constitutional: Positive for fatigue. Negative for fever, chills, appetite change and unexpected weight change.  Eyes: Negative for visual disturbance.  Respiratory: Negative for shortness of breath.   Cardiovascular: Negative for chest pain, palpitations and leg swelling.  Gastrointestinal: Positive for diarrhea and abdominal distention. Negative for nausea, vomiting, abdominal pain and constipation.  Musculoskeletal: Positive for myalgias and arthralgias.  Skin: Negative for color change and rash.  Hematological: Negative for adenopathy. Does not bruise/bleed easily.   Psychiatric/Behavioral: Negative for sleep disturbance and dysphoric mood. The patient is not nervous/anxious.        Objective:    BP 123/68 mmHg  Pulse 82  Temp(Src) 98.1 F (36.7 C) (Oral)  Ht 5' 1.5" (1.562 m)  Wt 131 lb (59.421 kg)  BMI 24.35 kg/m2  SpO2 95% Physical Exam  Constitutional: She is oriented to person, place, and time. She appears well-developed and well-nourished. No distress.  HENT:  Head: Normocephalic and atraumatic.  Right Ear: External ear normal.  Left Ear: External ear normal.  Nose: Nose normal.  Mouth/Throat: Oropharynx is clear and moist. No oropharyngeal exudate.  Eyes: Conjunctivae and EOM are normal. Pupils are equal, round, and reactive to light. Right eye exhibits no discharge.  Neck: Normal range of motion. Neck supple. No thyromegaly present.  Cardiovascular: Normal rate, regular rhythm, normal heart sounds and intact distal pulses.  Frequent extrasystoles are present. Exam reveals no gallop and no friction rub.   No murmur heard. Pulmonary/Chest: Effort normal. No respiratory distress. She has no wheezes. She has no rales.  Abdominal: Soft. Bowel sounds are normal. She exhibits no distension and no mass. There is no tenderness. There is no rebound and no guarding. A hernia is present. Hernia confirmed positive in the ventral area.    Musculoskeletal: Normal range of motion. She exhibits no edema or tenderness.  Lymphadenopathy:    She has no cervical adenopathy.  Neurological: She is alert and oriented to person, place, and time. No cranial nerve deficit. Coordination normal.  Skin: Skin is warm and dry. No rash noted. She is not diaphoretic. No erythema. No pallor.  Psychiatric: She has a normal mood and affect. Her behavior is normal. Judgment and thought content normal.          Assessment & Plan:   Problem List Items Addressed This Visit      Unprioritized   Atrial fibrillation [facility])    Reviewed recent cardiology notes. ECHO  scheduled. Continues on Metoprolol and has pacemaker in place. Anticoagulated with Coumadin.      Elevated blood sugar    Reviewed previous labs showing mild elevation of BG. Last a1c 6.4%. Encouraged healthy diet.  Hold on medication for now.      Left leg pain - Primary    Left lateral leg pain, likely secondary to contusion with fall. Reviewed [place] which was normal. Symptoms improving. Will monitor for now.  Umbilical hernia without obstruction and without gangrene    Exam c/w ventral wall hernia at site of previous surgery. This is large and reducible. Discussed option of surgical referral but she prefers to wait for now.          Return in about 3 months (around 09/23/2015) for Recheck.

## 2015-06-26 NOTE — Patient Instructions (Signed)
Continue current medications    Follow up in 3 months

## 2015-06-26 NOTE — Assessment & Plan Note (Signed)
Exam c/w ventral wall hernia at site of previous surgery. This is large and reducible. Discussed option of surgical referral but she prefers to wait for now.

## 2015-06-26 NOTE — Assessment & Plan Note (Signed)
Reviewed previous labs showing mild elevation of BG. Last a1c 6.4%. Encouraged healthy diet.  Hold on medication for now.

## 2015-06-26 NOTE — Assessment & Plan Note (Signed)
Left lateral leg pain, likely secondary to contusion with fall. Reviewed [place] which was normal. Symptoms improving. Will monitor for now.

## 2015-06-26 NOTE — Assessment & Plan Note (Signed)
Reviewed recent cardiology notes. ECHO scheduled. Continues on Metoprolol and has pacemaker in place. Anticoagulated with Coumadin.

## 2015-06-26 NOTE — Progress Notes (Signed)
Pre visit review using our clinic review tool, if applicable. No additional management support is needed unless otherwise documented below in the visit note. 

## 2015-06-28 ENCOUNTER — Encounter: Payer: Self-pay | Admitting: Internal Medicine

## 2015-06-28 DIAGNOSIS — S42202D Unspecified fracture of upper end of left humerus, subsequent encounter for fracture with routine healing: Secondary | ICD-10-CM | POA: Diagnosis not present

## 2015-06-28 DIAGNOSIS — M25512 Pain in left shoulder: Secondary | ICD-10-CM | POA: Diagnosis not present

## 2015-07-04 DIAGNOSIS — I10 Essential (primary) hypertension: Secondary | ICD-10-CM | POA: Diagnosis not present

## 2015-07-04 DIAGNOSIS — I071 Rheumatic tricuspid insufficiency: Secondary | ICD-10-CM | POA: Diagnosis not present

## 2015-07-04 DIAGNOSIS — I340 Nonrheumatic mitral (valve) insufficiency: Secondary | ICD-10-CM | POA: Diagnosis not present

## 2015-07-04 DIAGNOSIS — I480 Paroxysmal atrial fibrillation: Secondary | ICD-10-CM | POA: Diagnosis not present

## 2015-07-04 DIAGNOSIS — R600 Localized edema: Secondary | ICD-10-CM | POA: Diagnosis not present

## 2015-07-04 DIAGNOSIS — I495 Sick sinus syndrome: Secondary | ICD-10-CM | POA: Diagnosis not present

## 2015-07-04 DIAGNOSIS — R0602 Shortness of breath: Secondary | ICD-10-CM | POA: Diagnosis not present

## 2015-07-11 LAB — POCT INR: INR: 2.5 — AB (ref 0.9–1.1)

## 2015-07-18 ENCOUNTER — Telehealth: Payer: Self-pay | Admitting: Internal Medicine

## 2015-07-18 NOTE — Telephone Encounter (Signed)
Pt called to sch for a AWV. Call pt @ [phone]. Best times Mon am,Tues anytime, Wed anytime, Fri anytime. Thank you!

## 2015-07-19 LAB — POCT INR: INR: 2.4

## 2015-07-20 ENCOUNTER — Encounter: Payer: Self-pay | Admitting: Internal Medicine

## 2015-07-24 ENCOUNTER — Other Ambulatory Visit: Payer: Self-pay | Admitting: Internal Medicine

## 2015-07-24 NOTE — Telephone Encounter (Signed)
Last OV 07/06/15, Last filled on 05/22/15 #90 with 1 refill... Okay to refill?

## 2015-07-26 DIAGNOSIS — I4891 Unspecified atrial fibrillation: Secondary | ICD-10-CM | POA: Diagnosis not present

## 2015-07-26 LAB — POCT INR: INR: 2.4 — AB (ref 0.9–1.1)

## 2015-07-27 ENCOUNTER — Encounter: Payer: Self-pay | Admitting: Internal Medicine

## 2015-07-30 ENCOUNTER — Ambulatory Visit: Payer: Medicare Other

## 2015-07-31 ENCOUNTER — Other Ambulatory Visit: Payer: Self-pay | Admitting: Internal Medicine

## 2015-08-01 ENCOUNTER — Ambulatory Visit (INDEPENDENT_AMBULATORY_CARE_PROVIDER_SITE_OTHER): Payer: Medicare Other

## 2015-08-01 VITALS — BP 118/62 | HR 77 | Temp 98.1°F | Resp 14 | Ht 62.0 in | Wt 130.1 lb

## 2015-08-01 DIAGNOSIS — Z0000 Encounter for general adult medical examination without abnormal findings: Secondary | ICD-10-CM

## 2015-08-01 DIAGNOSIS — E2839 Other primary ovarian failure: Secondary | ICD-10-CM

## 2015-08-01 DIAGNOSIS — Z1239 Encounter for other screening for malignant neoplasm of breast: Secondary | ICD-10-CM

## 2015-08-01 NOTE — Progress Notes (Signed)
Annual Wellness Visit as completed by Health Coach was reviewed in full.  

## 2015-08-01 NOTE — Progress Notes (Signed)
Subjective:   Kara [name] is a 82 y.o. female who presents for Medicare Annual (Subsequent) preventive examination.  Review of Systems:  No ROS.  Medicare Wellness Visit.  Cardiac Risk Factors include: advanced age (>67men, >13 women)     Objective:     Vitals: BP 118/62 mmHg  Pulse 77  Temp(Src) 98.1 F (36.7 C) (Oral)  Resp 14  Ht 5\' 2"  (1.575 m)  Wt 130 lb 1.9 oz (59.022 kg)  BMI 23.79 kg/m2  SpO2 98%  Tobacco History  Smoking status  . Former Smoker  Smokeless tobacco  . Never Used    Comment: quit 45 years ago     Counseling given: Not Answered   Past Medical History  Diagnosis Date  . Hypertension   . Atrial fibrillation ([place])   . CAD (coronary artery disease)   . Chronic diarrhea   . GERD (gastroesophageal reflux disease)   . Hyperlipidemia   . History of colon polyps   . Glaucoma   . Fibrocystic breast disease   . Cancer (HCC)     UTERINE  . Allergy   . IBS (irritable bowel syndrome)   . Plantar fasciitis   . Edema of lower extremity   . Sleep apnea, obstructive   . Hematuria   . History of pancreatitis   . Hypokalemia   . Hemorrhoids   . Osteoarthritis   . Decubitus ulcer     sacral region  . Pernicious anemia   . Vitamin D deficiency   . Chronic anticoagulation   . Skin cancer   . Dysuria   . Mitral valve regurgitation   . Diabetes ([place])   . Glaucoma   . Recurrent UTI   . Vaginal atrophy   . Yeast vaginitis   . Microscopic hematuria    Past Surgical History  Procedure Laterality Date  . Breast biopsy  1970's  . Appendectomy  1940's  . Tonsillectomy  1936  . Abdominal hysterectomy  1980's  . Refractive surgery      for bilateral glaumoma  . Pacemaker placement    . Abdominal surgery      for villous polyp,,,many years ago  . Ascad, s/p ptca  11/28/2005    MID LESION   . Colectomy     Family History  Problem Relation Age of Onset  . Cancer Sister   . Diabetes    . Cancer Mother     COLON  . Coronary artery  disease Father   . Kidney disease Father   . Bladder Cancer Neg Hx    History  Sexual Activity  . Sexual Activity: No    Outpatient Encounter Prescriptions as of 08/01/2015  Medication Sig  . Calcium Carbonate-Vitamin D (CALCIUM-D) 600-400 MG-UNIT TABS Take 1 tablet by mouth daily.  . chlorhexidine (PERIDEX) 0.12 % solution   . cholecalciferol (VITAMIN D) 1000 UNITS tablet Take 1,000 Units by mouth daily.  Marland Kitchen COUMADIN 1 MG tablet TAKE 1 MG AND 1.5 MG ALTERNATING  . cyanocobalamin (,VITAMIN B-12,) 1000 MCG/ML injection INJECT 1 ML  INTO THE MUSCLE EVERY 30 DAYS.  Marland Kitchen diltiazem (CARDIZEM CD) 120 MG 24 hr capsule Take 1 capsule (120 mg total) by mouth daily.  . diphenoxylate-atropine (LOMOTIL) 2.5-0.025 MG tablet TAKE 1 TABLET FOUR TIMES DAILY AS NEEDED FOR DIARHREA  . estradiol (ESTRACE) 0.1 MG/GM vaginal cream Place 1 Applicatorful vaginally at bedtime.  Marland Kitchen HYDROcodone-acetaminophen (NORCO/VICODIN) 5-325 MG tablet Take 1-2 tablets by mouth 3 (three) times daily as needed for  moderate pain.  . hydrocortisone-pramoxine (ANALPRAM-HC) 2.5-1 % rectal cream APPLY RECTALLY THREE TIMES DAILY  . Hypromellose (GENTEAL) 0.3 % SOLN Apply 1 drop to eye daily.  Marland Kitchen lidocaine (LIDODERM) 5 % Place 1 patch onto the skin daily. Remove & Discard patch within 12 hours or as directed by MD  . metoprolol (LOPRESSOR) 50 MG tablet TAKE 1 TABLET BY MOUTH 2  TIMES DAILY.  . multivitamin-iron-minerals-folic acid (CENTRUM) chewable tablet Chew 1 tablet by mouth daily.  . potassium chloride (K-DUR,KLOR-CON) 10 MEQ tablet Take 1 tablet (10 mEq total) by mouth daily.  Marland Kitchen triamcinolone cream (KENALOG) 0.1 % Apply 1 application topically 2 (two) times daily.   No facility-administered encounter medications on file as of 08/01/2015.    Activities of Daily Living In your present state of health, do you have any difficulty performing the following activities: 08/01/2015  Hearing? N  Vision? N  Difficulty concentrating or making  decisions? N  Walking or climbing stairs? Y  Dressing or bathing? N  Doing errands, shopping? N  Preparing Food and eating ? Y  Using the Toilet? N  In the past six months, have you accidently leaked urine? Y  Do you have problems with loss of bowel control? N  Managing your Medications? N  Managing your Finances? Y  Housekeeping or managing your Housekeeping? Y    Patient Care Team: Jackolyn Confer, MD as PCP - General (Internal Medicine)    Assessment:   This is a routine wellness examination for Kara [name]. The goal of the wellness visit is to assist the patient how to close the gaps in care and create a preventative care plan for the patient.   Taking VIT D Calcium as appropriate/Osteoporosis risk reviewed.  DEXA Scan ordered and scheduled.  Medications reviewed; taking without issues or barriers.  Safety issues reviewed; smoke detectors in the home. No firearms in the home.  Wears seatbelts when driving or riding with others. No violence in the home.  No identified risk were noted; The patient was oriented x 3; appropriate in dress and manner and no objective failures at ADL's or IADL's.   Patient Concerns:  Requested mammogram; ordered and scheduled.  Follow up with PCP as needed.    Exercise Activities and Dietary recommendations Current Exercise Habits: The patient does not participate in regular exercise at present  Goals    . Increase physical activity     Walk around the pond 1 time, once a week.  Increase as tolerated.    . Increase water intake     Stay hydrated! Drink plenty of water.      Fall Risk Fall Risk  08/01/2015 10/11/2013 10/06/2012 04/14/2012  Falls in the past year? Yes Yes Yes No  Number falls in past yr: 1 1 1  -  Injury with Fall? Yes No No -  Follow up Education provided;Falls prevention discussed - - -   Depression Screen PHQ 2/9 Scores 08/01/2015 10/11/2013 10/06/2012 04/14/2012  PHQ - 2 Score 0 0 0 0     Cognitive Testing MMSE - Mini  Mental State Exam 08/01/2015  Orientation to time 5  Orientation to Place 5  Registration 3  Attention/ Calculation 5  Recall 3  Language- name 2 objects 2  Language- repeat 1  Language- follow 3 step command 3  Language- read & follow direction 1  Write a sentence 1  Copy design 1  Total score 30    Immunization History  Administered Date(s) Administered  . Influenza  Whole 02/28/2013  . Influenza,inj,Quad PF,36+ Mos 01/29/2015  . Influenza-Unspecified 03/17/2012, 03/02/2014  . Pneumococcal Conjugate-13 09/01/2013  . Pneumococcal-Unspecified 04/15/2007  . Tdap 11/11/2011  . Zoster 10/12/2011   Screening Tests Health Maintenance  Topic Date Due  . DEXA SCAN  09/23/1989  . INFLUENZA VACCINE  12/18/2015  . TETANUS/TDAP  11/10/2021  . ZOSTAVAX  Completed  . PNA vac Low Risk Adult  Completed      Plan:   End of life planning; Advance aging; Advanced directives discussed. Copy of current HCPOA/Living Will requested.   During the course of the visit the patient was educated and counseled about the following appropriate screening and preventive services:   Vaccines to include Pneumoccal, Influenza, Hepatitis B, Td, Zostavax, HCV  Electrocardiogram  Cardiovascular Disease  Colorectal cancer screening  Bone density screening  Diabetes screening  Glaucoma screening  Mammography/PAP  Nutrition counseling   Patient Instructions (the written plan) was given to the patient.   Varney Biles, LPN  [id number]

## 2015-08-01 NOTE — Patient Instructions (Addendum)
Kara [name] , Thank you for taking time to come for your Medicare Wellness Visit. I appreciate your ongoing commitment to your health goals. Please review the following plan we discussed and let me know if I can assist you in the future.     This is a list of the screening recommended for you and due dates:  Health Maintenance  Topic Date Due  . DEXA scan (bone density measurement)  09/23/1989  . Flu Shot  12/18/2015  . Tetanus Vaccine  11/10/2021  . Shingles Vaccine  Completed  . Pneumonia vaccines  Completed    Bone Densitometry Bone densitometry is an imaging test that uses a special X-ray to measure the amount of calcium and other minerals in your bones (bone density). This test is also known as a bone mineral density test or dual-energy X-ray absorptiometry (DXA). The test can measure bone density at your hip and your spine. It is similar to having a regular X-ray. You may have this test to:  Diagnose a condition that causes weak or thin bones (osteoporosis).  Predict your risk of a broken bone (fracture).  Determine how well osteoporosis treatment is working. LET [facility] CARE PROVIDER KNOW ABOUT:  Any allergies you have.  All medicines you are taking, including vitamins, herbs, eye drops, creams, and over-the-counter medicines.  Previous problems you or members of your family have had with the use of anesthetics.  Any blood disorders you have.  Previous surgeries you have had.  Medical conditions you have.  Possibility of pregnancy.  Any other medical test you had within the previous 14 days that used contrast material. RISKS AND COMPLICATIONS Generally, this is a safe procedure. However, problems can occur and may include the following:  This test exposes you to a very small amount of radiation.  The risks of radiation exposure may be greater to unborn children. BEFORE THE PROCEDURE  Do not take any calcium supplements for 24 hours before having the test.  You can otherwise eat and drink what you usually do.  Take off all metal jewelry, eyeglasses, dental appliances, and any other metal objects. PROCEDURE  You may lie on an exam table. There will be an X-ray generator below you and an imaging device above you.  Other devices, such as boxes or braces, may be used to position your body properly for the scan.  You will need to lie still while the machine slowly scans your body.  The images will show up on a computer monitor. AFTER THE PROCEDURE You may need more testing at a later time.   This information is not intended to replace advice given to you by your health care provider. Make sure you discuss any questions you have with your health care provider.   Document Released: 05/27/2004 Document Revised: 05/26/2014 Document Reviewed: 10/13/2013 Elsevier Interactive Patient Education 2016 [place] A mammogram is an X-ray of the breasts that is done to check for abnormal changes. This procedure can screen for and detect any changes that may suggest breast cancer. A mammogram can also identify other changes and variations in the breast, such as:  Inflammation of the breast tissue (mastitis).  An infected area that contains a collection of pus (abscess).  A fluid-filled sac (cyst).  Fibrocystic changes. This is when breast tissue becomes denser, which can make the tissue feel rope-like or uneven under the skin.  Tumors that are not cancerous (benign). LET [facility] CARE PROVIDER KNOW ABOUT:  Any allergies you  have.  If you have breast implants.  If you have had previous breast disease, biopsy, or surgery.  If you are breastfeeding.  Any possibility that you could be pregnant, if this applies.  If you are younger than age 29.  If you have a family history of breast cancer. RISKS AND COMPLICATIONS Generally, this is a safe procedure. However, problems may occur, including:  Exposure to radiation. Radiation  levels are very low with this test.  The results being misinterpreted.  The need for further tests.  The inability of the mammogram to detect certain cancers. BEFORE THE PROCEDURE  Schedule your test about 1-2 weeks after your menstrual period. This is usually when your breasts are the least tender.  If you have had a mammogram done at a different facility in the past, get the mammogram X-rays or have them sent to your current exam facility in order to compare them.  Wash your breasts and under your arms the day of the test.  Do not wear deodorants, perfumes, lotions, or powders anywhere on your body on the day of the test.  Remove any jewelry from your neck.  Wear clothes that you can change into and out of easily. PROCEDURE  You will undress from the waist up and put on a gown.  You will stand in front of the X-ray machine.  Each breast will be placed between two plastic or glass plates. The plates will compress your breast for a few seconds. Try to stay as relaxed as possible during the procedure. This does not cause any harm to your breasts and any discomfort you feel will be very brief.  X-rays will be taken from different angles of each breast. The procedure may vary among health care providers and hospitals. AFTER THE PROCEDURE  The mammogram will be examined by a specialist (radiologist).  You may need to repeat certain parts of the test, depending on the quality of the images. This is commonly done if the radiologist needs a better view of the breast tissue.  Ask when your test results will be ready. Make sure you get your test results.  You may resume your normal activities.   This information is not intended to replace advice given to you by your health care provider. Make sure you discuss any questions you have with your health care provider.   Document Released: 05/02/2000 Document Revised: 01/24/2015 Document Reviewed: 07/14/2014 Elsevier Interactive Patient  Education [employer].

## 2015-08-09 ENCOUNTER — Telehealth: Payer: Self-pay

## 2015-08-09 NOTE — Telephone Encounter (Signed)
I spoke with him. She states that she missed one of her doses of Coumadin. Will not make any further changes. Patient to continue 1 mg daily.

## 2015-08-09 NOTE — Telephone Encounter (Signed)
Dr. Lacinda Axon these results are for Dr. Gilford Rile but she left for the evening can you take this result. Lab called and stated that the  pt's INR is 1.8. Please advise, thanks

## 2015-08-13 ENCOUNTER — Encounter: Payer: Self-pay | Admitting: Internal Medicine

## 2015-08-16 DIAGNOSIS — I480 Paroxysmal atrial fibrillation: Secondary | ICD-10-CM | POA: Diagnosis not present

## 2015-08-16 LAB — PROTIME-INR: INR: 2.4 — AB (ref 0.9–1.1)

## 2015-08-17 LAB — PROTIME-INR: INR: 2.4 — AB (ref 0.9–1.1)

## 2015-08-20 ENCOUNTER — Other Ambulatory Visit: Payer: Self-pay | Admitting: Internal Medicine

## 2015-08-22 ENCOUNTER — Other Ambulatory Visit: Payer: Medicare Other

## 2015-08-22 ENCOUNTER — Ambulatory Visit: Payer: Medicare Other

## 2015-08-23 DIAGNOSIS — I4891 Unspecified atrial fibrillation: Secondary | ICD-10-CM | POA: Diagnosis not present

## 2015-08-27 ENCOUNTER — Other Ambulatory Visit: Payer: Self-pay | Admitting: Internal Medicine

## 2015-08-27 NOTE — Telephone Encounter (Signed)
This was filled last on 07/24/2015, please advise refill. thanks

## 2015-08-27 NOTE — Telephone Encounter (Signed)
Pt called to check the status of her Rx. Call pt @ [phone]. Thank you!

## 2015-08-31 LAB — POCT INR: INR: 2.3 — AB (ref 0.9–1.1)

## 2015-09-04 ENCOUNTER — Other Ambulatory Visit: Payer: Self-pay | Admitting: Internal Medicine

## 2015-09-04 ENCOUNTER — Ambulatory Visit
Admission: RE | Admit: 2015-09-04 | Discharge: 2015-09-04 | Disposition: A | Discharge status: Home / Self Care | Payer: Medicare Other | Source: Ambulatory Visit | Attending: Internal Medicine | Admitting: Internal Medicine

## 2015-09-04 DIAGNOSIS — M85852 Other specified disorders of bone density and structure, left thigh: Secondary | ICD-10-CM | POA: Insufficient documentation

## 2015-09-04 DIAGNOSIS — E2839 Other primary ovarian failure: Secondary | ICD-10-CM | POA: Diagnosis not present

## 2015-09-04 DIAGNOSIS — Z780 Asymptomatic menopausal state: Secondary | ICD-10-CM | POA: Diagnosis not present

## 2015-09-04 DIAGNOSIS — M85832 Other specified disorders of bone density and structure, left forearm: Secondary | ICD-10-CM | POA: Insufficient documentation

## 2015-09-04 DIAGNOSIS — Z1239 Encounter for other screening for malignant neoplasm of breast: Secondary | ICD-10-CM

## 2015-09-04 DIAGNOSIS — Z1231 Encounter for screening mammogram for malignant neoplasm of breast: Secondary | ICD-10-CM | POA: Diagnosis not present

## 2015-09-10 ENCOUNTER — Encounter: Payer: Self-pay | Admitting: Internal Medicine

## 2015-09-11 ENCOUNTER — Other Ambulatory Visit: Payer: Self-pay | Admitting: Internal Medicine

## 2015-09-20 DIAGNOSIS — I4891 Unspecified atrial fibrillation: Secondary | ICD-10-CM | POA: Diagnosis not present

## 2015-09-26 ENCOUNTER — Encounter: Payer: Self-pay | Admitting: Internal Medicine

## 2015-09-26 ENCOUNTER — Ambulatory Visit (INDEPENDENT_AMBULATORY_CARE_PROVIDER_SITE_OTHER): Payer: Medicare Other | Admitting: Internal Medicine

## 2015-09-26 VITALS — BP 104/62 | HR 89 | Temp 98.0°F | Ht 61.5 in | Wt 128.5 lb

## 2015-09-26 DIAGNOSIS — I4891 Unspecified atrial fibrillation: Secondary | ICD-10-CM | POA: Diagnosis not present

## 2015-09-26 DIAGNOSIS — G5601 Carpal tunnel syndrome, right upper limb: Secondary | ICD-10-CM

## 2015-09-26 DIAGNOSIS — N952 Postmenopausal atrophic vaginitis: Secondary | ICD-10-CM

## 2015-09-26 DIAGNOSIS — G609 Hereditary and idiopathic neuropathy, unspecified: Secondary | ICD-10-CM | POA: Diagnosis not present

## 2015-09-26 LAB — COMPREHENSIVE METABOLIC PANEL
ALT: 15 U/L (ref 0–35)
AST: 19 U/L (ref 0–37)
Albumin: 3.8 g/dL (ref 3.5–5.2)
Alkaline Phosphatase: 67 U/L (ref 39–117)
BUN: 16 mg/dL (ref 6–23)
CO2: 27 mEq/L (ref 19–32)
Calcium: 9 mg/dL (ref 8.4–10.5)
Chloride: 103 mEq/L (ref 96–112)
Creatinine, Ser: 0.84 mg/dL (ref 0.40–1.20)
GFR: 67.55 mL/min (ref >60.00)
Glucose, Bld: 162 mg/dL — ABNORMAL HIGH (ref 70–99)
Potassium: 3.3 mEq/L — ABNORMAL LOW (ref 3.5–5.1)
Sodium: 140 mEq/L (ref 135–145)
Total Bilirubin: 1.1 mg/dL (ref 0.2–1.2)
Total Protein: 6.5 g/dL (ref 6.0–8.3)

## 2015-09-26 LAB — CBC WITH DIFFERENTIAL/PLATELET
Basophils Absolute: 0 10*3/uL (ref 0.0–0.1)
Basophils Relative: 0.4 % (ref 0.0–3.0)
Eosinophils Absolute: 0.1 10*3/uL (ref 0.0–0.7)
Eosinophils Relative: 0.9 % (ref 0.0–5.0)
HCT: 41 % (ref 36.0–46.0)
Hemoglobin: 13.8 g/dL (ref 12.0–15.0)
Lymphocytes Relative: 41.2 % (ref 12.0–46.0)
Lymphs Abs: 3.4 10*3/uL (ref 0.7–4.0)
MCHC: 33.7 g/dL (ref 30.0–36.0)
MCV: 89.5 fl (ref 78.0–100.0)
Monocytes Absolute: 0.5 10*3/uL (ref 0.1–1.0)
Monocytes Relative: 5.9 % (ref 3.0–12.0)
Neutro Abs: 4.2 10*3/uL (ref 1.4–7.7)
Neutrophils Relative %: 51.6 % (ref 43.0–77.0)
Platelets: 191 10*3/uL (ref 150.0–400.0)
RBC: 4.58 Mil/uL (ref 3.87–5.11)
RDW: 13.5 % (ref 11.5–15.5)
WBC: 8.1 10*3/uL (ref 4.0–10.5)

## 2015-09-26 MED ORDER — DILTIAZEM HCL ER COATED BEADS 120 MG PO CP24: 120.0000 mg | ORAL_CAPSULE | Freq: Every day | ORAL | Status: DC

## 2015-09-26 NOTE — Assessment & Plan Note (Signed)
Encouraged use of Estrace as prescribed.

## 2015-09-26 NOTE — Addendum Note (Signed)
Addended by: Carmin Muskrat on: 09/26/2015 04:29 PM   Modules accepted: Medications

## 2015-09-26 NOTE — Assessment & Plan Note (Signed)
Rate well controlled with diltiazem. Anticoagulated with Coumadin. Regular rhythm on exam today. Will continue to monitor.

## 2015-09-26 NOTE — Assessment & Plan Note (Signed)
Progressive symptoms of right thumb and first finger numbness and weakened grip strength. Will set up orthopedics evaluation discuss options for surgical intervention.

## 2015-09-26 NOTE — Progress Notes (Signed)
Pre visit review using our clinic review tool, if applicable. No additional management support is needed unless otherwise documented below in the visit note. 

## 2015-09-26 NOTE — Progress Notes (Signed)
Subjective:    Patient ID: Kara [name], female    DOB: 1922-07-22, 82 y.o.   MRN: [id number]  HPI  82YO female presents for follow up.  Carpal tunnel - Right-sided. Feels that this is getting worse. Strength in hand is diminished. Numbness noted in distal fingers. She is right handed. She has not yet seen orthopedics.  Vaginal itching - off and on. No vaginal discharge. Seen by urologist for this and started on Estrace cream.  Seen by cardiology in 06/2015. No chest pain, dyspnea, palpitations. Feels tired at times. This is unchanged. She has been monitoring INR at home. She questions if she should be taking a plant supplement.  Some chronic left leg pain at site of previous leg injury. Pain is only present with palpitation of lateral left thigh at times.  Wt Readings from Last 3 Encounters:  09/26/15 128 lb 8 oz (58.287 kg)  08/01/15 130 lb 1.9 oz (59.022 kg)  06/26/15 131 lb (59.421 kg)   BP Readings from Last 3 Encounters:  09/26/15 104/62  08/01/15 118/62  06/26/15 123/68    Past Medical History  Diagnosis Date  . Hypertension   . Atrial fibrillation ([place])   . CAD (coronary artery disease)   . Chronic diarrhea   . GERD (gastroesophageal reflux disease)   . Hyperlipidemia   . History of colon polyps   . Glaucoma   . Fibrocystic breast disease   . Cancer (HCC)     UTERINE  . Allergy   . IBS (irritable bowel syndrome)   . Plantar fasciitis   . Edema of lower extremity   . Sleep apnea, obstructive   . Hematuria   . History of pancreatitis   . Hypokalemia   . Hemorrhoids   . Osteoarthritis   . Decubitus ulcer     sacral region  . Pernicious anemia   . Vitamin D deficiency   . Chronic anticoagulation   . Skin cancer   . Dysuria   . Mitral valve regurgitation   . Diabetes ([place])   . Glaucoma   . Recurrent UTI   . Vaginal atrophy   . Yeast vaginitis   . Microscopic hematuria    Family History  Problem Relation Age of Onset  . Cancer Sister   . Diabetes     . Cancer Mother     COLON  . Coronary artery disease Father   . Kidney disease Father   . Bladder Cancer Neg Hx   . Breast cancer Neg Hx    Past Surgical History  Procedure Laterality Date  . Appendectomy  1940's  . Tonsillectomy  1936  . Abdominal hysterectomy  1980's  . Refractive surgery      for bilateral glaumoma  . Pacemaker placement    . Abdominal surgery      for villous polyp,,,many years ago  . Ascad, s/p ptca  11/28/2005    MID LESION   . Colectomy    . Breast biopsy Left 1970's   Social History   Social History  . Marital Status: Married    Spouse Name: N/A  . Number of Children: 3  . Years of Education: N/A   Occupational History  . Retired Restaurant manager, fast food - primary    Social History Main Topics  . Smoking status: Former Research scientist (life sciences)  . Smokeless tobacco: Never Used     Comment: quit 45 years ago  . Alcohol Use: 0.0 oz/week    0 Standard drinks or equivalent per  week     Comment: Rarely  . Drug Use: Yes  . Sexual Activity: No   Other Topics Concern  . None   Social History Narrative   Lives at [place]. Born in [place]. Travels frequently.      1 daughter, 2 step children      Regular Exercise -  NO   Daily Caffeine Use:  1 coffee             Review of Systems  Constitutional: Positive for fatigue. Negative for fever, chills, appetite change and unexpected weight change.  Eyes: Negative for visual disturbance.  Respiratory: Negative for cough and shortness of breath.   Cardiovascular: Negative for chest pain, palpitations and leg swelling.  Gastrointestinal: Negative for nausea, vomiting, abdominal pain, diarrhea and constipation.  Musculoskeletal: Positive for myalgias and arthralgias.  Skin: Negative for color change and rash.  Neurological: Positive for weakness and numbness. Negative for dizziness, facial asymmetry, light-headedness and headaches.  Hematological: Negative for adenopathy. Does not bruise/bleed easily.    Psychiatric/Behavioral: Negative for dysphoric mood. The patient is not nervous/anxious.        Objective:    BP 104/62 mmHg  Pulse 89  Temp(Src) 98 F (36.7 C) (Oral)  Ht 5' 1.5" (1.562 m)  Wt 128 lb 8 oz (58.287 kg)  BMI 23.89 kg/m2  SpO2 97% Physical Exam  Constitutional: She is oriented to person, place, and time. She appears well-developed and well-nourished. No distress.  HENT:  Head: Normocephalic and atraumatic.  Right Ear: External ear normal.  Left Ear: External ear normal.  Nose: Nose normal.  Mouth/Throat: Oropharynx is clear and moist. No oropharyngeal exudate.  Eyes: Conjunctivae are normal. Pupils are equal, round, and reactive to light. Right eye exhibits no discharge. Left eye exhibits no discharge. No scleral icterus.  Neck: Normal range of motion. Neck supple. No tracheal deviation present. No thyromegaly present.  Cardiovascular: Normal rate, regular rhythm, normal heart sounds and intact distal pulses.  Exam reveals no gallop and no friction rub.   No murmur heard. Pulmonary/Chest: Effort normal and breath sounds normal. No accessory muscle usage. No tachypnea. No respiratory distress. She has no decreased breath sounds. She has no wheezes. She has no rhonchi. She has no rales. She exhibits no tenderness.  Musculoskeletal: Normal range of motion. She exhibits no edema or tenderness.  Lymphadenopathy:    She has no cervical adenopathy.  Neurological: She is alert and oriented to person, place, and time. No cranial nerve deficit. She exhibits normal muscle tone. Coordination normal.  Skin: Skin is warm and dry. No rash noted. She is not diaphoretic. No erythema. No pallor.  Psychiatric: She has a normal mood and affect. Her behavior is normal. Judgment and thought content normal.          Assessment & Plan:   Problem List Items Addressed This Visit      Unprioritized   Atrial fibrillation ([place])    Rate well controlled with diltiazem. Anticoagulated  with Coumadin. Regular rhythm on exam today. Will continue to monitor.      Relevant Medications   diltiazem (CARDIZEM CD) 120 MG 24 hr capsule   Atrophic vaginitis    Encouraged use of Estrace as prescribed.      Carpal tunnel syndrome of right wrist - Primary    Progressive symptoms of right thumb and first finger numbness and weakened grip strength. Will set up orthopedics evaluation discuss options for surgical intervention.  Relevant Orders   Ambulatory referral to Orthopedic Surgery   RESOLVED: Hereditary and idiopathic peripheral neuropathy   Relevant Orders   Comprehensive metabolic panel   CBC with Differential/Platelet       Return in about 3 months (around 12/27/2015) for Recheck.  Ronette Deter, MD Internal Medicine [facility]

## 2015-09-26 NOTE — Patient Instructions (Signed)
We will set up evaluation with orthopedics for carpal tunnel.  Labs today.  Follow up in 3 months.

## 2015-09-27 LAB — POCT INR: INR: 1.5 — AB (ref 0.9–1.1)

## 2015-09-28 ENCOUNTER — Telehealth: Payer: Self-pay

## 2015-09-28 NOTE — Telephone Encounter (Signed)
MD INR called to report an out of range INR on patient.   Results were INR: 1.5 which was tested yesterday 09/27/15.  Please advise. thanks

## 2015-09-28 NOTE — Telephone Encounter (Signed)
She can continue to monitor for now. She checks her INR weekly using a home machine and most readings have been in the goal range.

## 2015-09-28 NOTE — Telephone Encounter (Signed)
Spoke with the patient, she verbalized understanding, she will check it next week.

## 2015-10-04 DIAGNOSIS — G5601 Carpal tunnel syndrome, right upper limb: Secondary | ICD-10-CM | POA: Diagnosis not present

## 2015-10-04 LAB — POCT INR: INR: 2.2 — AB (ref 0.9–1.1)

## 2015-10-10 DIAGNOSIS — G5601 Carpal tunnel syndrome, right upper limb: Secondary | ICD-10-CM | POA: Diagnosis not present

## 2015-10-12 LAB — POCT INR: INR: 2.7 — AB (ref <=1.1)

## 2015-10-16 ENCOUNTER — Encounter: Payer: Self-pay | Admitting: Internal Medicine

## 2015-10-17 ENCOUNTER — Encounter: Payer: Self-pay | Admitting: Internal Medicine

## 2015-10-18 DIAGNOSIS — I4891 Unspecified atrial fibrillation: Secondary | ICD-10-CM | POA: Diagnosis not present

## 2015-10-29 ENCOUNTER — Other Ambulatory Visit: Payer: Self-pay | Admitting: Orthopedic Surgery

## 2015-10-30 ENCOUNTER — Other Ambulatory Visit: Payer: Self-pay | Admitting: Internal Medicine

## 2015-10-30 NOTE — Telephone Encounter (Signed)
Refill request for Lomotil, last [id number], last filled [id number].  Please advise.

## 2015-11-06 DIAGNOSIS — E119 Type 2 diabetes mellitus without complications: Secondary | ICD-10-CM | POA: Diagnosis not present

## 2015-11-06 DIAGNOSIS — E785 Hyperlipidemia, unspecified: Secondary | ICD-10-CM | POA: Diagnosis not present

## 2015-11-06 DIAGNOSIS — M79609 Pain in unspecified limb: Secondary | ICD-10-CM | POA: Diagnosis not present

## 2015-11-06 DIAGNOSIS — I10 Essential (primary) hypertension: Secondary | ICD-10-CM | POA: Diagnosis not present

## 2015-11-06 DIAGNOSIS — I70213 Atherosclerosis of native arteries of extremities with intermittent claudication, bilateral legs: Secondary | ICD-10-CM | POA: Diagnosis not present

## 2015-11-06 DIAGNOSIS — I739 Peripheral vascular disease, unspecified: Secondary | ICD-10-CM | POA: Diagnosis not present

## 2015-11-08 LAB — POCT INR: INR: 3.3 — AB (ref <=1.1)

## 2015-11-14 DIAGNOSIS — I071 Rheumatic tricuspid insufficiency: Secondary | ICD-10-CM | POA: Diagnosis not present

## 2015-11-14 DIAGNOSIS — Z01818 Encounter for other preprocedural examination: Secondary | ICD-10-CM | POA: Diagnosis not present

## 2015-11-14 DIAGNOSIS — I480 Paroxysmal atrial fibrillation: Secondary | ICD-10-CM | POA: Diagnosis not present

## 2015-11-14 DIAGNOSIS — I2510 Atherosclerotic heart disease of native coronary artery without angina pectoris: Secondary | ICD-10-CM | POA: Diagnosis not present

## 2015-11-14 DIAGNOSIS — I495 Sick sinus syndrome: Secondary | ICD-10-CM | POA: Diagnosis not present

## 2015-11-15 DIAGNOSIS — I4891 Unspecified atrial fibrillation: Secondary | ICD-10-CM | POA: Diagnosis not present

## 2015-11-15 LAB — POCT INR: INR: 2.6 — AB (ref 0.9–1.1)

## 2015-11-22 ENCOUNTER — Ambulatory Visit (INDEPENDENT_AMBULATORY_CARE_PROVIDER_SITE_OTHER): Payer: Medicare Other | Admitting: Internal Medicine

## 2015-11-22 ENCOUNTER — Encounter: Payer: Self-pay | Admitting: Internal Medicine

## 2015-11-22 VITALS — BP 122/62 | HR 83 | Ht 62.0 in | Wt 130.4 lb

## 2015-11-22 DIAGNOSIS — Z7901 Long term (current) use of anticoagulants: Secondary | ICD-10-CM | POA: Diagnosis not present

## 2015-11-22 DIAGNOSIS — G5601 Carpal tunnel syndrome, right upper limb: Secondary | ICD-10-CM

## 2015-11-22 DIAGNOSIS — I4891 Unspecified atrial fibrillation: Secondary | ICD-10-CM | POA: Diagnosis not present

## 2015-11-22 DIAGNOSIS — K529 Noninfective gastroenteritis and colitis, unspecified: Secondary | ICD-10-CM

## 2015-11-22 LAB — CBC WITH DIFFERENTIAL/PLATELET
Basophils Absolute: 0 10*3/uL (ref 0.0–0.1)
Basophils Relative: 0.4 % (ref 0.0–3.0)
Eosinophils Absolute: 0.1 10*3/uL (ref 0.0–0.7)
Eosinophils Relative: 1 % (ref 0.0–5.0)
HCT: 39.9 % (ref 36.0–46.0)
Hemoglobin: 13.5 g/dL (ref 12.0–15.0)
Lymphocytes Relative: 35.9 % (ref 12.0–46.0)
Lymphs Abs: 2.9 10*3/uL (ref 0.7–4.0)
MCHC: 33.8 g/dL (ref 30.0–36.0)
MCV: 89.3 fl (ref 78.0–100.0)
Monocytes Absolute: 0.8 10*3/uL (ref 0.1–1.0)
Monocytes Relative: 9.5 % (ref 3.0–12.0)
Neutro Abs: 4.2 10*3/uL (ref 1.4–7.7)
Neutrophils Relative %: 53.2 % (ref 43.0–77.0)
Platelets: 180 10*3/uL (ref 150.0–400.0)
RBC: 4.47 Mil/uL (ref 3.87–5.11)
RDW: 13.7 % (ref 11.5–15.5)
WBC: 7.9 10*3/uL (ref 4.0–10.5)

## 2015-11-22 LAB — PROTIME-INR
INR: 2.2 ratio — ABNORMAL HIGH (ref 0.8–1.0)
Prothrombin Time: 24.1 s — ABNORMAL HIGH (ref 9.6–13.1)

## 2015-11-22 LAB — COMPREHENSIVE METABOLIC PANEL
ALT: 13 U/L (ref 0–35)
AST: 15 U/L (ref 0–37)
Albumin: 3.6 g/dL (ref 3.5–5.2)
Alkaline Phosphatase: 70 U/L (ref 39–117)
BUN: 15 mg/dL (ref 6–23)
CO2: 26 mEq/L (ref 19–32)
Calcium: 9 mg/dL (ref 8.4–10.5)
Chloride: 106 mEq/L (ref 96–112)
Creatinine, Ser: 0.78 mg/dL (ref 0.40–1.20)
GFR: 73.55 mL/min (ref >60.00)
Glucose, Bld: 111 mg/dL — ABNORMAL HIGH (ref 70–99)
Potassium: 3.2 mEq/L — ABNORMAL LOW (ref 3.5–5.1)
Sodium: 140 mEq/L (ref 135–145)
Total Bilirubin: 1.2 mg/dL (ref 0.2–1.2)
Total Protein: 6.5 g/dL (ref 6.0–8.3)

## 2015-11-22 MED ORDER — ESTRADIOL 0.1 MG/GM VA CREA: 1.0000 | TOPICAL_CREAM | Freq: Every day | VAGINAL | Status: DC

## 2015-11-22 NOTE — Assessment & Plan Note (Signed)
Symptoms relatively stable. Chronic diarrhea after colectomy in the past. Stable for several years. Continue prn Lomotil.

## 2015-11-22 NOTE — Patient Instructions (Signed)
Labs today

## 2015-11-22 NOTE — Assessment & Plan Note (Signed)
Encouraged her to consider local anesthesia given risks of GA at her age.

## 2015-11-22 NOTE — Assessment & Plan Note (Signed)
INR with labs today. Goal INR 2-3.

## 2015-11-22 NOTE — Assessment & Plan Note (Signed)
Rate well controlled on Diltiazem. Anticoagulated with Coumadin. INR today. Continue home checks with MD-INR weekly. Follow up with cardiology as scheduled.

## 2015-11-22 NOTE — Progress Notes (Signed)
Subjective:    Patient ID: Kara [name], female    DOB: Sep 27, 1922, 82 y.o.   MRN: [id number]  HPI  82YO female presents for follow up.  AFIB - She continues to follow INR at home. Last week 2.6. No CP or palpitations.  Diarrhea - Chronic diarrhea symptoms have been stable. Carries Lomotil with her on a regular basis. No recent abdominal pain. Typically has several BMs per day.  Planning to have carpal tunnel surgery. May have block instead of GA.  Wt Readings from Last 3 Encounters:  11/22/15 130 lb 6.4 oz (59.149 kg)  09/26/15 128 lb 8 oz (58.287 kg)  08/01/15 130 lb 1.9 oz (59.022 kg)   BP Readings from Last 3 Encounters:  11/22/15 122/62  09/26/15 104/62  08/01/15 118/62    Past Medical History  Diagnosis Date  . Hypertension   . Atrial fibrillation ([place])   . CAD (coronary artery disease)   . Chronic diarrhea   . GERD (gastroesophageal reflux disease)   . Hyperlipidemia   . History of colon polyps   . Glaucoma   . Fibrocystic breast disease   . Cancer (HCC)     UTERINE  . Allergy   . IBS (irritable bowel syndrome)   . Plantar fasciitis   . Edema of lower extremity   . Sleep apnea, obstructive   . Hematuria   . History of pancreatitis   . Hypokalemia   . Hemorrhoids   . Osteoarthritis   . Decubitus ulcer     sacral region  . Pernicious anemia   . Vitamin D deficiency   . Chronic anticoagulation   . Skin cancer   . Dysuria   . Mitral valve regurgitation   . Diabetes ([place])   . Glaucoma   . Recurrent UTI   . Vaginal atrophy   . Yeast vaginitis   . Microscopic hematuria    Family History  Problem Relation Age of Onset  . Cancer Sister   . Diabetes    . Cancer Mother     COLON  . Coronary artery disease Father   . Kidney disease Father   . Bladder Cancer Neg Hx   . Breast cancer Neg Hx    Past Surgical History  Procedure Laterality Date  . Appendectomy  1940's  . Tonsillectomy  1936  . Abdominal hysterectomy  1980's  . Refractive surgery       for bilateral glaumoma  . Pacemaker placement    . Abdominal surgery      for villous polyp,,,many years ago  . Ascad, s/p ptca  11/28/2005    MID LESION   . Colectomy    . Breast biopsy Left 1970's   Social History   Social History  . Marital Status: Married    Spouse Name: N/A  . Number of Children: 3  . Years of Education: N/A   Occupational History  . Retired Restaurant manager, fast food - primary    Social History Main Topics  . Smoking status: Former Research scientist (life sciences)  . Smokeless tobacco: Never Used     Comment: quit 45 years ago  . Alcohol Use: 0.0 oz/week    0 Standard drinks or equivalent per week     Comment: Rarely  . Drug Use: Yes  . Sexual Activity: No   Other Topics Concern  . None   Social History Narrative   Lives at [place]. Born in [place]. Travels frequently.      1 daughter, 2  step children      Regular Exercise -  NO   Daily Caffeine Use:  1 coffee             Review of Systems  Constitutional: Negative for fever, chills, appetite change, fatigue and unexpected weight change.  Eyes: Negative for visual disturbance.  Respiratory: Negative for shortness of breath.   Cardiovascular: Negative for chest pain, palpitations and leg swelling.  Gastrointestinal: Positive for diarrhea. Negative for nausea, vomiting, abdominal pain and constipation.  Musculoskeletal: Negative for myalgias and arthralgias.  Skin: Negative for color change and rash.  Hematological: Negative for adenopathy. Does not bruise/bleed easily.  Psychiatric/Behavioral: Negative for sleep disturbance and dysphoric mood. The patient is not nervous/anxious.        Objective:    BP 122/62 mmHg  Pulse 83  Ht 5\' 2"  (1.575 m)  Wt 130 lb 6.4 oz (59.149 kg)  BMI 23.84 kg/m2  SpO2 95% Physical Exam  Constitutional: She is oriented to person, place, and time. She appears well-developed and well-nourished. No distress.  HENT:  Head: Normocephalic and atraumatic.  Right Ear:  External ear normal.  Left Ear: External ear normal.  Nose: Nose normal.  Mouth/Throat: Oropharynx is clear and moist. No oropharyngeal exudate.  Eyes: Conjunctivae are normal. Pupils are equal, round, and reactive to light. Right eye exhibits no discharge. Left eye exhibits no discharge. No scleral icterus.  Neck: Normal range of motion. Neck supple. No tracheal deviation present. No thyromegaly present.  Cardiovascular: Normal rate, normal heart sounds and intact distal pulses.  An irregularly irregular rhythm present. Exam reveals no gallop and no friction rub.   No murmur heard. Pulmonary/Chest: Effort normal and breath sounds normal. No respiratory distress. She has no wheezes. She has no rales. She exhibits no tenderness.  Musculoskeletal: Normal range of motion. She exhibits no edema or tenderness.  Lymphadenopathy:    She has no cervical adenopathy.  Neurological: She is alert and oriented to person, place, and time. No cranial nerve deficit. She exhibits normal muscle tone. Coordination normal.  Skin: Skin is warm and dry. No rash noted. She is not diaphoretic. No erythema. No pallor.  Psychiatric: She has a normal mood and affect. Her behavior is normal. Judgment and thought content normal.          Assessment & Plan:   Problem List Items Addressed This Visit      Unprioritized   Atrial fibrillation ([place]) - Primary (Chronic)    Rate well controlled on Diltiazem. Anticoagulated with Coumadin. INR today. Continue home checks with MD-INR weekly. Follow up with cardiology as scheduled.      Carpal tunnel syndrome of right wrist    Encouraged her to consider local anesthesia given risks of GA at her age.      Chronic anticoagulation    INR with labs today. Goal INR 2-3.      Relevant Orders   INR/PT   Chronic diarrhea (Chronic)    Symptoms relatively stable. Chronic diarrhea after colectomy in the past. Stable for several years. Continue prn Lomotil.      Relevant  Orders   CBC with Differential/Platelet   Comprehensive metabolic panel       Return in about 4 weeks (around 12/20/2015) for New Patient.  Ronette Deter, MD Internal Medicine [facility]

## 2015-11-22 NOTE — Progress Notes (Signed)
Pre visit review using our clinic review tool, if applicable. No additional management support is needed unless otherwise documented below in the visit note. 

## 2015-11-23 LAB — POCT INR: INR: 2.5 — AB (ref <=1.1)

## 2015-11-29 LAB — POCT INR: INR: 2.7 — AB (ref 0.9–1.1)

## 2015-12-04 ENCOUNTER — Encounter
Admission: RE | Admit: 2015-12-04 | Discharge: 2015-12-04 | Disposition: A | Discharge status: Home / Self Care | Payer: Medicare Other | Source: Ambulatory Visit | Attending: Orthopedic Surgery | Admitting: Orthopedic Surgery

## 2015-12-04 ENCOUNTER — Other Ambulatory Visit: Payer: Medicare Other

## 2015-12-04 DIAGNOSIS — I10 Essential (primary) hypertension: Secondary | ICD-10-CM | POA: Insufficient documentation

## 2015-12-04 DIAGNOSIS — I4891 Unspecified atrial fibrillation: Secondary | ICD-10-CM | POA: Diagnosis not present

## 2015-12-04 DIAGNOSIS — E119 Type 2 diabetes mellitus without complications: Secondary | ICD-10-CM | POA: Insufficient documentation

## 2015-12-04 DIAGNOSIS — I2510 Atherosclerotic heart disease of native coronary artery without angina pectoris: Secondary | ICD-10-CM | POA: Diagnosis not present

## 2015-12-04 DIAGNOSIS — Z01812 Encounter for preprocedural laboratory examination: Secondary | ICD-10-CM | POA: Insufficient documentation

## 2015-12-04 DIAGNOSIS — K219 Gastro-esophageal reflux disease without esophagitis: Secondary | ICD-10-CM | POA: Diagnosis not present

## 2015-12-04 DIAGNOSIS — E785 Hyperlipidemia, unspecified: Secondary | ICD-10-CM | POA: Diagnosis not present

## 2015-12-04 DIAGNOSIS — K589 Irritable bowel syndrome without diarrhea: Secondary | ICD-10-CM | POA: Diagnosis not present

## 2015-12-04 LAB — CBC WITH DIFFERENTIAL/PLATELET
Basophils Absolute: 0 10*3/uL (ref 0–0.1)
Basophils Relative: 0 %
Eosinophils Absolute: 0.1 10*3/uL (ref 0–0.7)
Eosinophils Relative: 1 %
HCT: 43.6 % (ref 35.0–47.0)
Hemoglobin: 14.9 g/dL (ref 12.0–16.0)
Lymphocytes Relative: 28 %
Lymphs Abs: 2.4 10*3/uL (ref 1.0–3.6)
MCH: 30.7 pg (ref 26.0–34.0)
MCHC: 34.2 g/dL (ref 32.0–36.0)
MCV: 89.8 fL (ref 80.0–100.0)
Monocytes Absolute: 0.5 10*3/uL (ref 0.2–0.9)
Monocytes Relative: 6 %
Neutro Abs: 5.4 10*3/uL (ref 1.4–6.5)
Neutrophils Relative %: 65 %
Platelets: 162 10*3/uL (ref 150–440)
RBC: 4.85 MIL/uL (ref 3.80–5.20)
RDW: 13.3 % (ref 11.5–14.5)
WBC: 8.5 10*3/uL (ref 3.6–11.0)

## 2015-12-04 LAB — BASIC METABOLIC PANEL
Anion gap: 8 (ref 5–15)
BUN: 15 mg/dL (ref 6–20)
CO2: 29 mmol/L (ref 22–32)
Calcium: 9.1 mg/dL (ref 8.9–10.3)
Chloride: 104 mmol/L (ref 101–111)
Creatinine, Ser: 0.87 mg/dL (ref 0.44–1.00)
GFR calc Af Amer: >60 mL/min (ref >60)
GFR calc non Af Amer: 57 mL/min — ABNORMAL LOW (ref >60)
Glucose, Bld: 177 mg/dL — ABNORMAL HIGH (ref 65–99)
Potassium: 2.8 mmol/L — ABNORMAL LOW (ref 3.5–5.1)
Sodium: 141 mmol/L (ref 135–145)

## 2015-12-04 LAB — PROTIME-INR
INR: 2.43
Prothrombin Time: 26.1 seconds — ABNORMAL HIGH (ref 11.4–15.0)

## 2015-12-04 LAB — APTT: aPTT: 39 seconds — ABNORMAL HIGH (ref 24–36)

## 2015-12-04 NOTE — Patient Instructions (Signed)
  Your procedure is scheduled on:Wednesday July 26 , 2017. Report to Same Day Surgery. To find out your arrival time please call [phone] between 1PM - 3PM on Tuesday December 11, 2015.  Remember: Instructions that are not followed completely may result in serious medical risk, up to and including death, or upon the discretion of your surgeon and anesthesiologist your surgery may need to be rescheduled.    _x___ 1. Do not eat food or drink liquids after midnight. No gum chewing or hard candies.     ____ 2. No Alcohol for 24 hours before or after surgery.   ____ 3. Bring all medications with you on the day of surgery if instructed.    __x__ 4. Notify your doctor if there is any change in your medical condition     (cold, fever, infections).     Do not wear jewelry, make-up, hairpins, clips or nail polish.  Do not wear lotions, powders, or perfumes. You may wear deodorant.  Do not shave 48 hours prior to surgery. Men may shave face and neck.  Do not bring valuables to the hospital.    [facility] is not responsible for any belongings or valuables.               Contacts, dentures or bridgework may not be worn into surgery.  Leave your suitcase in the car. After surgery it may be brought to your room.  For patients admitted to the hospital, discharge time is determined by your treatment team.   Patients discharged the day of surgery will not be allowed to drive home.    Please read over the following fact sheets that you were given:   [facility] Preparing for Surgery  _x___ Take these medicines the morning of surgery with A SIP OF WATER:    1. diltiazem (CARDIZEM SR)   2. metoprolol (LOPRESSOR)  3. diphenoxylate-atropine (LOMOTIL) if needed.  ____ Fleet Enema (as directed)   _x___ Use CHG Soap as directed on instruction sheet  ____ Use inhalers on the day of surgery and bring to hospital day of surgery  ____ Stop metformin 2 days prior to surgery    ____ Take 1/2 of usual  insulin dose the night before surgery and none on the morning of surgery.   __x__ Stop Coumadin on per Dr. Harden Mo instructions.  ____ Stop Anti-inflammatories such as Advil, Aleve, Ibuprofen, Motrin, Naproxen,  Naprosyn, Goodies powders or aspirin products.   __x__ Stop supplements: Vitamins B and D until after surgery.    ____ Bring C-Pap to the hospital.

## 2015-12-04 NOTE — Pre-Procedure Instructions (Signed)
Noted Tylenol 1000 mg was ordered orally.  Left message for clarification of this order at Dr. Harden Mo office if Tylenol is to be orally or IVPB.

## 2015-12-04 NOTE — Pre-Procedure Instructions (Signed)
Sinus rhythm with short PR with frequent ventricular-paced complexes and premature supraventricular complexes Left axis deviation Right bundle branch block Abnormal ECG When compared with ECG of 02-Dec-2005 16:23, PREVIOUS ECG IS PRESENT I reviewed and concur with this report. Electronically signed [id number] MD, Darnell Level [phone]) on 11/15/2015 5:13:43 PM

## 2015-12-04 NOTE — Pre-Procedure Instructions (Signed)
Potassium result faxed to Dr. Mack Guise with request for potassium supplement. Will recheck potassium day of surgery.

## 2015-12-06 ENCOUNTER — Encounter: Payer: Self-pay | Admitting: *Deleted

## 2015-12-06 ENCOUNTER — Telehealth: Payer: Self-pay | Admitting: Internal Medicine

## 2015-12-06 NOTE — Pre-Procedure Instructions (Addendum)
SPOKE WITH DR Gildardo Griffes AND WILL NEED TO GIVE IV Kt AM SURGERY. ALSO NOTIFIED [place]. WILL PLACE NOTE FOR ANESTHESIA ON CHART. PATIENT UNABLE TO TAKE PO SUPP DUE TO COLECTOMY AND CHRONIC DIARRHEA. SEE TELEPHONE NOTE FROM DR Ronette Deter.  CALL BACK FROM DR Marcello Moores WHO RECOMMENDS PATIENT BE ADMITTED DAY BEFORE SURGERY BY SURGEON AND CONTACT HOSPITALIST TO ORDER IV KT DEPENDING ON RECHECK RESULT SO BE GIVEN SLOWLY OVERNIGHT WITH RECHECK KT EARLY AM. SPOKE WITH Jomar Denz AT DR Mack Guise, HE IS OUT OF OFFICE UNTIL AFTERNOON.

## 2015-12-06 NOTE — Telephone Encounter (Signed)
The only other option besides oral potassium would be IV potassium. These labs were not ordered by me. I assume they were ordered by anesthesiology, and she should talk to them about her concerns and see if they are planning to give her IV potassium.

## 2015-12-06 NOTE — Telephone Encounter (Signed)
Spoke with Judeen Hammans, they plan to give IV morning of to see if it comes up, thanks

## 2015-12-06 NOTE — Telephone Encounter (Signed)
Kara [name] [phone] called from [facility] regarding pt having a low potassium level. Pt states she cannot take any potassium supplements due to getting diarrhea. Pt also had a colectomy done. So if pt takes a supplement she will have diarrhea for about 16 or 18 times a day.   Please advise?

## 2015-12-06 NOTE — Telephone Encounter (Signed)
Per labs in chart, K is 2.8, please advise for options as patient has a procedure today, thanks

## 2015-12-10 ENCOUNTER — Telehealth: Payer: Self-pay | Admitting: Internal Medicine

## 2015-12-10 NOTE — Telephone Encounter (Signed)
Patient needs to be admitted night before, but I know we don't have admitting privelages, do you know how to accomplish this?

## 2015-12-10 NOTE — Pre-Procedure Instructions (Signed)
CALL FROM DR Harden Mo OFFICE AND THEY WILL TRY TO ADMIT DAY BEFORE SURGERY AND HAVE HOSPITALIST MANAGE IV KT REPLACEMENT

## 2015-12-10 NOTE — Telephone Encounter (Signed)
The admitting physician or anesthesiologist can write for IV potassium

## 2015-12-10 NOTE — Telephone Encounter (Signed)
They are wanting to know how to go about admitting her, so you have any ideas?

## 2015-12-10 NOTE — Telephone Encounter (Signed)
No. If she cannot tolerate oral potassium, then she will need to receive IV potassium, which should be completed when admitted for surgery

## 2015-12-10 NOTE — Telephone Encounter (Signed)
I have left a message for Kara [name], I have no idea how to get her admitted, so they will need to contact a hospitalist to assist at the hospital.  thanks

## 2015-12-10 NOTE — Telephone Encounter (Signed)
Judeen Hammans [phone] called from [facility] regarding pt needing to be admitted the night before her surgery on 07/26 due to her potassium level at 2.8. Admit her night before or Hospialist admission for treatment for Hypokalemia (IV) the doctor is Dr Mack Guise. Thank you!

## 2015-12-12 ENCOUNTER — Observation Stay
Admission: RE | Admit: 2015-12-12 | Discharge: 2015-12-13 | Disposition: A | Discharge status: Home / Self Care | Payer: Medicare Other | Source: Ambulatory Visit | Attending: Orthopedic Surgery | Admitting: Orthopedic Surgery

## 2015-12-12 DIAGNOSIS — Z950 Presence of cardiac pacemaker: Secondary | ICD-10-CM | POA: Insufficient documentation

## 2015-12-12 DIAGNOSIS — Z86010 Personal history of colonic polyps: Secondary | ICD-10-CM | POA: Insufficient documentation

## 2015-12-12 DIAGNOSIS — Z85828 Personal history of other malignant neoplasm of skin: Secondary | ICD-10-CM | POA: Insufficient documentation

## 2015-12-12 DIAGNOSIS — I10 Essential (primary) hypertension: Secondary | ICD-10-CM | POA: Diagnosis not present

## 2015-12-12 DIAGNOSIS — K529 Noninfective gastroenteritis and colitis, unspecified: Secondary | ICD-10-CM | POA: Diagnosis not present

## 2015-12-12 DIAGNOSIS — I2510 Atherosclerotic heart disease of native coronary artery without angina pectoris: Secondary | ICD-10-CM | POA: Diagnosis not present

## 2015-12-12 DIAGNOSIS — D510 Vitamin B12 deficiency anemia due to intrinsic factor deficiency: Secondary | ICD-10-CM | POA: Diagnosis not present

## 2015-12-12 DIAGNOSIS — E559 Vitamin D deficiency, unspecified: Secondary | ICD-10-CM | POA: Insufficient documentation

## 2015-12-12 DIAGNOSIS — G5601 Carpal tunnel syndrome, right upper limb: Principal | ICD-10-CM | POA: Insufficient documentation

## 2015-12-12 DIAGNOSIS — R3129 Other microscopic hematuria: Secondary | ICD-10-CM | POA: Diagnosis not present

## 2015-12-12 DIAGNOSIS — Z809 Family history of malignant neoplasm, unspecified: Secondary | ICD-10-CM | POA: Insufficient documentation

## 2015-12-12 DIAGNOSIS — G5600 Carpal tunnel syndrome, unspecified upper limb: Secondary | ICD-10-CM | POA: Diagnosis not present

## 2015-12-12 DIAGNOSIS — Z8542 Personal history of malignant neoplasm of other parts of uterus: Secondary | ICD-10-CM | POA: Diagnosis not present

## 2015-12-12 DIAGNOSIS — E876 Hypokalemia: Secondary | ICD-10-CM | POA: Diagnosis not present

## 2015-12-12 DIAGNOSIS — N6019 Diffuse cystic mastopathy of unspecified breast: Secondary | ICD-10-CM | POA: Diagnosis not present

## 2015-12-12 DIAGNOSIS — I4891 Unspecified atrial fibrillation: Secondary | ICD-10-CM | POA: Insufficient documentation

## 2015-12-12 DIAGNOSIS — M1990 Unspecified osteoarthritis, unspecified site: Secondary | ICD-10-CM | POA: Diagnosis not present

## 2015-12-12 DIAGNOSIS — G4733 Obstructive sleep apnea (adult) (pediatric): Secondary | ICD-10-CM | POA: Diagnosis not present

## 2015-12-12 DIAGNOSIS — H409 Unspecified glaucoma: Secondary | ICD-10-CM | POA: Diagnosis not present

## 2015-12-12 DIAGNOSIS — I340 Nonrheumatic mitral (valve) insufficiency: Secondary | ICD-10-CM | POA: Diagnosis not present

## 2015-12-12 DIAGNOSIS — Z8249 Family history of ischemic heart disease and other diseases of the circulatory system: Secondary | ICD-10-CM | POA: Insufficient documentation

## 2015-12-12 DIAGNOSIS — Z87891 Personal history of nicotine dependence: Secondary | ICD-10-CM | POA: Diagnosis not present

## 2015-12-12 DIAGNOSIS — Z888 Allergy status to other drugs, medicaments and biological substances status: Secondary | ICD-10-CM | POA: Insufficient documentation

## 2015-12-12 DIAGNOSIS — K219 Gastro-esophageal reflux disease without esophagitis: Secondary | ICD-10-CM | POA: Diagnosis not present

## 2015-12-12 DIAGNOSIS — Z87440 Personal history of urinary (tract) infections: Secondary | ICD-10-CM | POA: Diagnosis not present

## 2015-12-12 DIAGNOSIS — Z833 Family history of diabetes mellitus: Secondary | ICD-10-CM | POA: Insufficient documentation

## 2015-12-12 DIAGNOSIS — E785 Hyperlipidemia, unspecified: Secondary | ICD-10-CM | POA: Insufficient documentation

## 2015-12-12 DIAGNOSIS — K589 Irritable bowel syndrome without diarrhea: Secondary | ICD-10-CM | POA: Insufficient documentation

## 2015-12-12 DIAGNOSIS — E119 Type 2 diabetes mellitus without complications: Secondary | ICD-10-CM | POA: Insufficient documentation

## 2015-12-12 DIAGNOSIS — D649 Anemia, unspecified: Secondary | ICD-10-CM | POA: Insufficient documentation

## 2015-12-12 DIAGNOSIS — F419 Anxiety disorder, unspecified: Secondary | ICD-10-CM | POA: Insufficient documentation

## 2015-12-12 LAB — BASIC METABOLIC PANEL
Anion gap: 9 (ref 5–15)
BUN: 16 mg/dL (ref 6–20)
CO2: 28 mmol/L (ref 22–32)
Calcium: 9 mg/dL (ref 8.9–10.3)
Chloride: 105 mmol/L (ref 101–111)
Creatinine, Ser: 0.79 mg/dL (ref 0.44–1.00)
GFR calc Af Amer: >60 mL/min (ref >60)
GFR calc non Af Amer: >60 mL/min (ref >60)
Glucose, Bld: 116 mg/dL — ABNORMAL HIGH (ref 65–99)
Potassium: 3.5 mmol/L (ref 3.5–5.1)
Sodium: 142 mmol/L (ref 135–145)

## 2015-12-12 LAB — CBC
HCT: 41.8 % (ref 35.0–47.0)
Hemoglobin: 14.1 g/dL (ref 12.0–16.0)
MCH: 30.6 pg (ref 26.0–34.0)
MCHC: 33.8 g/dL (ref 32.0–36.0)
MCV: 90.5 fL (ref 80.0–100.0)
Platelets: 164 10*3/uL (ref 150–440)
RBC: 4.63 MIL/uL (ref 3.80–5.20)
RDW: 13.5 % (ref 11.5–14.5)
WBC: 7.5 10*3/uL (ref 3.6–11.0)

## 2015-12-12 MED ORDER — POTASSIUM CHLORIDE 10 MEQ/100ML IV SOLN
10.0000 meq | INTRAVENOUS | Status: DC
  Administered 2015-12-12: 10 meq via INTRAVENOUS
  Filled 2015-12-12 (×6): qty 100

## 2015-12-12 MED ORDER — POTASSIUM CHLORIDE 10 MEQ/50ML IV SOLN
10.0000 meq | INTRAVENOUS | Status: DC
  Filled 2015-12-12 (×6): qty 50

## 2015-12-12 MED ORDER — DIPHENOXYLATE-ATROPINE 2.5-0.025 MG/5ML PO LIQD: 5.0000 mL | Freq: Four times a day (QID) | ORAL | Status: DC | PRN

## 2015-12-12 MED ORDER — DIPHENOXYLATE-ATROPINE 2.5-0.025 MG PO TABS: 1.0000 | ORAL_TABLET | Freq: Four times a day (QID) | ORAL | Status: DC | PRN

## 2015-12-12 MED ORDER — POTASSIUM CHLORIDE 10 MEQ/100ML IV SOLN
10.0000 meq | Freq: Once | INTRAVENOUS | Status: DC
  Filled 2015-12-12: qty 100

## 2015-12-12 MED ORDER — METOPROLOL TARTRATE 25 MG PO TABS
25.0000 mg | ORAL_TABLET | Freq: Two times a day (BID) | ORAL | Status: DC
  Administered 2015-12-12 – 2015-12-13 (×2): 25 mg via ORAL
  Filled 2015-12-12 (×2): qty 1

## 2015-12-12 MED ORDER — DILTIAZEM HCL 60 MG PO TABS
120.0000 mg | ORAL_TABLET | Freq: Every day | ORAL | Status: DC
  Filled 2015-12-12: qty 2

## 2015-12-12 MED ORDER — POTASSIUM CHLORIDE 20 MEQ PO PACK: 40.0000 meq | PACK | ORAL | Status: DC

## 2015-12-12 MED ORDER — POTASSIUM CHLORIDE 10 MEQ/100ML IV SOLN
10.0000 meq | Freq: Once | INTRAVENOUS | Status: AC
  Administered 2015-12-12: 10 meq via INTRAVENOUS
  Filled 2015-12-12: qty 100

## 2015-12-12 MED ORDER — VITAMIN D 1000 UNITS PO TABS: 1000.0000 [IU] | ORAL_TABLET | Freq: Every day | ORAL | Status: DC

## 2015-12-12 NOTE — Progress Notes (Signed)
Patient home med list obtained, orders placed per dr Mack Guise.

## 2015-12-12 NOTE — H&P (Signed)
PREOPERATIVE H&P  Chief Complaint: G56.01: Carpal tunnel syndrome, right upper limb  HPI: Kara [name] is a 82 y.o. female who presents for preoperative history and physical with a diagnosis of G56.01: Carpal tunnel syndrome, right upper limb. Symptoms are rated as moderate to severe, and have been worsening.  This is significantly impairing activities of daily living.  She has elected for surgical management.  Patient was seen by preop testing. The anesthesiologist noted hypokalemia. Patient is unable to take oral formulations of potassium due to severe GI upset. Patient is being admitted preoperatively for potassium IV repletion.  Past Medical History:  Diagnosis Date  . Allergy   . Atrial fibrillation ([place])   . CAD (coronary artery disease)   . Cancer (HCC)    UTERINE  . Chronic anticoagulation   . Chronic diarrhea   . Decubitus ulcer    sacral region  . Diabetes ([place])    diet controlled  . Dysuria   . Edema of lower extremity    mainly right foot, slightly in left foot.  . Fibrocystic breast disease   . GERD (gastroesophageal reflux disease)   . Glaucoma   . Glaucoma   . Hematuria   . Hemorrhoids   . History of colon polyps   . History of pancreatitis   . Hyperlipidemia   . Hypertension   . Hypokalemia   . IBS (irritable bowel syndrome)   . Microscopic hematuria   . Mitral valve regurgitation   . Osteoarthritis    fingers  . Pernicious anemia   . Plantar fasciitis   . Recurrent UTI   . Skin cancer   . Sleep apnea, obstructive   . Vaginal atrophy   . Vitamin D deficiency   . Yeast vaginitis    Past Surgical History:  Procedure Laterality Date  . ABDOMINAL HYSTERECTOMY  1980's  . ABDOMINAL SURGERY     for villous polyp,,,many years ago  . APPENDECTOMY  1940's  . ASCAD, s/p PTCA  11/28/2005   MID LESION   . BREAST BIOPSY Left 1970's  . COLECTOMY    . PACEMAKER PLACEMENT    . radation     for uterine cance  . REFRACTIVE SURGERY     for bilateral glaumoma   . TONSILLECTOMY  1936   Social History   Social History  . Marital status: Married    Spouse name: N/A  . Number of children: 3  . Years of education: N/A   Occupational History  . Retired Restaurant manager, fast food - primary    Social History Main Topics  . Smoking status: Former Research scientist (life sciences)  . Smokeless tobacco: Never Used     Comment: quit 45 years ago  . Alcohol use 0.0 oz/week     Comment: Rarely, 1-2 times a year  . Drug use: No  . Sexual activity: No   Other Topics Concern  . None   Social History Narrative   Lives at [facility]. Born in [place]. Travels frequently.      1 daughter, 2 step children      Regular Exercise -  NO   Daily Caffeine Use:  1 coffee            Family History  Problem Relation Age of Onset  . Coronary artery disease Father   . Kidney disease Father   . Cancer Sister   . Diabetes    . Cancer Mother     COLON  . Bladder Cancer Neg Hx   .  Breast cancer Neg Hx    Allergies  Allergen Reactions  . Baycol [Cerivastatin Sodium]   . Cardizem [Diltiazem Hcl]     LOWER EXTREMITY EDEMA  . Crestor [Rosuvastatin Calcium] Other (See Comments)    INTOLERANT  . Dronedarone Other (See Comments)    Fatigue  . Metoprolol Tartrate Other (See Comments)  . Omeprazole Other (See Comments)  . Pravachol     INTOLERANT  . Statins Other (See Comments)  . Vioxx [Rofecoxib]   . Zocor [Simvastatin]     INTOLERANT   Prior to Admission medications   Medication Sig Start Date End Date Taking? Authorizing Provider  cholecalciferol (VITAMIN D) 1000 UNITS tablet Take 1,000 Units by mouth daily.   Yes Historical Provider, MD  cyanocobalamin (,VITAMIN B-12,) 1000 MCG/ML injection INJECT 1 ML  INTO THE MUSCLE EVERY 30 DAYS. 09/15/14  Yes Jackolyn Confer, MD  diphenoxylate-atropine (LOMOTIL) 2.5-0.025 MG tablet TAKE 1 TABLET FOUR TIMES DAILY AS NEEDED FOR DIARHREA 10/30/15  Yes Jackolyn Confer, MD  estradiol (ESTRACE) 0.1 MG/GM vaginal cream Place 1  Applicatorful vaginally at bedtime. Patient taking differently: Place 1 Applicatorful vaginally daily as needed.  11/22/15  Yes Jackolyn Confer, MD  HYDROcodone-acetaminophen (NORCO/VICODIN) 5-325 MG tablet Take 1-2 tablets by mouth 3 (three) times daily as needed for moderate pain. 06/26/15  Yes Jackolyn Confer, MD  hydrocortisone-pramoxine [facility]) 2.5-1 % rectal cream APPLY RECTALLY THREE TIMES DAILY Patient taking differently: APPLY RECTALLY THREE TIMES DAILY AS NEEDED 09/15/14  Yes Jackolyn Confer, MD  Hypromellose (GENTEAL) 0.3 % SOLN Place 1 drop into both eyes daily as needed (dryness).    Yes Historical Provider, MD  metoprolol (LOPRESSOR) 50 MG tablet TAKE 1 TABLET BY MOUTH 2  TIMES DAILY. Patient taking differently: 25mg  in the morning and 25mg  in the evening 06/21/15  Yes Jackolyn Confer, MD  triamcinolone cream (KENALOG) 0.1 % Apply 1 application topically 2 (two) times daily. Patient taking differently: Apply 1 application topically 2 (two) times daily as needed.  09/15/14  Yes Jackolyn Confer, MD  diltiazem (CARDIZEM SR) 120 MG 12 hr capsule Take 120 mg by mouth every morning.    Historical Provider, MD  lidocaine (LIDODERM) 5 % Place 1 patch onto the skin daily. Remove & Discard patch within 12 hours or as directed by MD 04/17/14   Jackolyn Confer, MD  warfarin (COUMADIN) 1 MG tablet Take 1 mg by mouth daily at 6 PM.    Historical Provider, MD     Positive ROS: All other systems have been reviewed and were otherwise negative with the exception of those mentioned in the HPI and as above.  Physical Exam: General: Alert, no acute distress Cardiovascular: Regular rate and rhythm, no murmurs rubs or gallops.  No pedal edema Respiratory: Clear to auscultation bilaterally, no wheezes rales or rhonchi. No cyanosis, no use of accessory musculature GI: No organomegaly, abdomen is soft and non-tender nondistended with positive bowel sounds. Skin: Skin intact, no lesions within  the operative field. Neurologic: Sensation intact distally Psychiatric: Patient is competent for consent with normal mood and affect Lymphatic: No axillary or cervical lymphadenopathy  MUSCULOSKELETAL: Right hand: Patient with numbness in the thumb index and middle finger. There is moderate hyperthenar eminence atrophy. Patient can flex and extend all 5 digits of the right hand. Her fingers are well-perfused and she has a palpable radial pulse.  Assessment: G56.01: Carpal tunnel syndrome, right upper limb Hypokalemia  Plan: Plan for Procedure(s): CARPAL TUNNEL  RELEASE. RIGHT HAND scheduled for tomorrow  Patient is been admitted for IV treatment of hyperkalemia. I will consult the hospitalist service for preoperative evaluation and assistance with hypokalemia treatment. Surgical is scheduled for tomorrow morning pending normal potassium levels.  I discussed the risks and benefits of surgery. The risks include but are not limited to infection, bleeding requiring blood transfusion, nerve or blood vessel injury, joint stiffness or loss of motion, persistent pain, weakness or instability, malunion, nonunion and hardware failure and the need for further surgery. Medical risks include but are not limited to DVT and pulmonary embolism, myocardial infarction, stroke, pneumonia, respiratory failure and death. Patient understood these risks and wished to proceed.   Thornton Park, MD   12/12/2015 12:48 PM

## 2015-12-12 NOTE — Consult Note (Signed)
[facility] [place] NAME: Avani Ash    MR#:  [id number]  DATE OF BIRTH:  August 30, 1922  DATE OF ADMISSION:  12/12/2015  PRIMARY CARE PHYSICIAN: Rica Mast, MD   REQUESTING/REFERRING PHYSICIAN: Dr. Mack Guise  CHIEF COMPLAINT:  No chief complaint on file.   HISTORY OF PRESENT ILLNESS:  Cairo Coccaro  is a 82 y.o. female with a known history ofAtrial fibrillations, coronary artery disease, chronic hypokalemia admitted to orthopedic service for right carpal tunnel surgery. We're asked to see the patient for preoperative clearance. Also management of hypokalemia. Patient says he cannot take potassium  capsules. Because of significant diarrhea. But she never tried the KCl powder form. Now she is getting  kcl 10 Meq IV for 2 bags, he complains that IV is burning. Patient denies any other complaints.  PAST MEDICAL HISTORY:   Past Medical History:  Diagnosis Date  . Allergy   . Atrial fibrillation ([place])   . CAD (coronary artery disease)   . Cancer (HCC)    UTERINE  . Chronic anticoagulation   . Chronic diarrhea   . Decubitus ulcer    sacral region  . Diabetes ([place])    diet controlled  . Dysuria   . Edema of lower extremity    mainly right foot, slightly in left foot.  . Fibrocystic breast disease   . GERD (gastroesophageal reflux disease)   . Glaucoma   . Glaucoma   . Hematuria   . Hemorrhoids   . History of colon polyps   . History of pancreatitis   . Hyperlipidemia   . Hypertension   . Hypokalemia   . IBS (irritable bowel syndrome)   . Microscopic hematuria   . Mitral valve regurgitation   . Osteoarthritis    fingers  . Pernicious anemia   . Plantar fasciitis   . Recurrent UTI   . Skin cancer   . Sleep apnea, obstructive   . Vaginal atrophy   . Vitamin D deficiency   . Yeast vaginitis     PAST SURGICAL HISTOIRY:   Past Surgical History:  Procedure Laterality Date  . ABDOMINAL HYSTERECTOMY  1980's  .  ABDOMINAL SURGERY     for villous polyp,,,many years ago  . APPENDECTOMY  1940's  . ASCAD, s/p PTCA  11/28/2005   MID LESION   . BREAST BIOPSY Left 1970's  . COLECTOMY    . PACEMAKER PLACEMENT    . radation     for uterine cance  . REFRACTIVE SURGERY     for bilateral glaumoma  . TONSILLECTOMY  1936    SOCIAL HISTORY:   Social History  Substance Use Topics  . Smoking status: Former Research scientist (life sciences)  . Smokeless tobacco: Never Used     Comment: quit 45 years ago  . Alcohol use 0.0 oz/week     Comment: Rarely, 1-2 times a year    FAMILY HISTORY:   Family History  Problem Relation Age of Onset  . Coronary artery disease Father   . Kidney disease Father   . Cancer Sister   . Diabetes    . Cancer Mother     COLON  . Bladder Cancer Neg Hx   . Breast cancer Neg Hx     DRUG ALLERGIES:   Allergies  Allergen Reactions  . Baycol [Cerivastatin Sodium]   . Cardizem [Diltiazem Hcl]     LOWER EXTREMITY EDEMA  . Crestor [Rosuvastatin Calcium] Other (See Comments)    INTOLERANT  .  Dronedarone Other (See Comments)    Fatigue  . Metoprolol Tartrate Other (See Comments)  . Omeprazole Other (See Comments)  . Pravachol     INTOLERANT  . Statins Other (See Comments)  . Vioxx [Rofecoxib]   . Zocor [Simvastatin]     INTOLERANT    REVIEW OF SYSTEMS:  CONSTITUTIONAL: No fever, fatigue or weakness.  EYES: No blurred or double vision.  EARS, NOSE, AND THROAT: No tinnitus or ear pain.  RESPIRATORY: No cough, shortness of breath, wheezing or hemoptysis.  CARDIOVASCULAR: No chest pain, orthopnea, edema.  GASTROINTESTINAL: No nausea, vomiting, diarrhea or abdominal pain.  GENITOURINARY: No dysuria, hematuria.  ENDOCRINE: No polyuria, nocturia,  HEMATOLOGY: No anemia, easy bruising or bleeding SKIN: No rash or lesion. MUSCULOSKELETAL:Patient is here for right carpal surgery. NEUROLOGIC: No tingling, numbness, weakness.  PSYCHIATRY: No anxiety or depression.   MEDICATIONS AT HOME:    Prior to Admission medications   Medication Sig Start Date End Date Taking? Authorizing Provider  cholecalciferol (VITAMIN D) 1000 UNITS tablet Take 1,000 Units by mouth daily.   Yes Historical Provider, MD  cyanocobalamin (,VITAMIN B-12,) 1000 MCG/ML injection INJECT 1 ML  INTO THE MUSCLE EVERY 30 DAYS. 09/15/14  Yes Jackolyn Confer, MD  diphenoxylate-atropine (LOMOTIL) 2.5-0.025 MG tablet TAKE 1 TABLET FOUR TIMES DAILY AS NEEDED FOR DIARHREA 10/30/15  Yes Jackolyn Confer, MD  estradiol (ESTRACE) 0.1 MG/GM vaginal cream Place 1 Applicatorful vaginally at bedtime. Patient taking differently: Place 1 Applicatorful vaginally daily as needed.  11/22/15  Yes Jackolyn Confer, MD  HYDROcodone-acetaminophen (NORCO/VICODIN) 5-325 MG tablet Take 1-2 tablets by mouth 3 (three) times daily as needed for moderate pain. 06/26/15  Yes Jackolyn Confer, MD  hydrocortisone-pramoxine [facility]) 2.5-1 % rectal cream APPLY RECTALLY THREE TIMES DAILY Patient taking differently: APPLY RECTALLY THREE TIMES DAILY AS NEEDED 09/15/14  Yes Jackolyn Confer, MD  Hypromellose (GENTEAL) 0.3 % SOLN Place 1 drop into both eyes daily as needed (dryness).    Yes Historical Provider, MD  metoprolol (LOPRESSOR) 50 MG tablet TAKE 1 TABLET BY MOUTH 2  TIMES DAILY. Patient taking differently: 25mg  in the morning and 25mg  in the evening 06/21/15  Yes Jackolyn Confer, MD  triamcinolone cream (KENALOG) 0.1 % Apply 1 application topically 2 (two) times daily. Patient taking differently: Apply 1 application topically 2 (two) times daily as needed.  09/15/14  Yes Jackolyn Confer, MD  diltiazem (CARDIZEM SR) 120 MG 12 hr capsule Take 120 mg by mouth every morning.    Historical Provider, MD  lidocaine (LIDODERM) 5 % Place 1 patch onto the skin daily. Remove & Discard patch within 12 hours or as directed by MD 04/17/14   Jackolyn Confer, MD  warfarin (COUMADIN) 1 MG tablet Take 1 mg by mouth daily at 6 PM.    Historical Provider, MD       VITAL SIGNS:  Blood pressure (!) 149/75, pulse 92, temperature 97.7 F (36.5 C), temperature source Oral, resp. rate 18, SpO2 97 %.  PHYSICAL EXAMINATION:  GENERAL:  82 y.o.-year-old patient lying in the bed with no acute distress.  EYES: Pupils equal, round, reactive to light and accommodation. No scleral icterus. Extraocular muscles intact.  HEENT: Head atraumatic, normocephalic. Oropharynx and nasopharynx clear.  NECK:  Supple, no jugular venous distention. No thyroid enlargement, no tenderness.  LUNGS: Normal breath sounds bilaterally, no wheezing, rales,rhonchi or crepitation. No use of accessory muscles of respiration.  CARDIOVASCULAR: S1, S2 normal. No murmurs, rubs, or gallops.  ABDOMEN: Soft, nontender, nondistended. Bowel sounds present. No organomegaly or mass.  EXTREMITIES: No pedal edema, cyanosis, or clubbing.  NEUROLOGIC: Cranial nerves II through XII are intact. Muscle strength 5/5 in all extremities. Sensation intact. Gait not checked.  PSYCHIATRIC: The patient is alert and oriented x 3.  SKIN: No obvious rash, lesion, or ulcer.   LABORATORY PANEL:   CBC  Recent Labs Lab 12/12/15 1255  WBC 7.5  HGB 14.1  HCT 41.8  PLT 164   ------------------------------------------------------------------------------------------------------------------  Chemistries   Recent Labs Lab 12/12/15 1255  NA 142  K 3.5  CL 105  CO2 28  GLUCOSE 116*  BUN 16  CREATININE 0.79  CALCIUM 9.0   ------------------------------------------------------------------------------------------------------------------  Cardiac Enzymes No results for input(s): TROPONINI in the last 168 hours. ------------------------------------------------------------------------------------------------------------------  RADIOLOGY:  No results found.  EKG:   Orders placed or performed in visit on 10/06/12  . EKG 12-Lead    IMPRESSION AND PLAN:   #1. Hypokalemia: Patient was severely  hypokalemic a week ago but now potassium is now normal. Getting IV potassium chloride 10 ME Q IV to 2 bags. Recommend continuing that. And recheck potassium tomorrow .pt has tendency for recurrent hypokalemia.. #2. history of atrial fibrillation: Rate controlled. Continue Cardizem. Patient on Cardizem CD 120 mg at home and metoprolol.restart them.hold warfarin for surgery, resume after surgery, Patient is at moderate risk for surgery because of advanced age and history of atrial fibrillation. But certainly can be performed in the potassium improves and if in between 3.7 to 4,    l the records are reviewed and case discussed with ED provider. Management plans discussed with the patient, family and they are in agreement.  CODE STATUS: full  TOTAL TIME TAKING CARE OF THIS PATIENT: 55 min minutes.    Epifanio Lesches M.D on 12/12/2015 at 2:49 PM  Between 7am to 6pm - Pager - [phone]  After 6pm go to [email or web address] - password [email or web address] [facility]  Office  [phone]  CC: Primary care physician; Rica Mast, MD  Note: This dictation was prepared with Dragon dictation along with smaller phrase technology. Any transcriptional errors that result from this process are unintentional.

## 2015-12-12 NOTE — Care Management Note (Signed)
Case Management Note  Patient Details  Name: Kara [name] MRN: [id number] Date of Birth: Jul 29, 1922  Subjective/Objective:   Spoke with patient who is very alert and oriented from independent living at [facility] [place].  Still drives self and uses no DME. Here for low potassium and has scheduled carpal tunnel surgery for tomorrow as OP. Continue to follow but no needs identified.                 Action/Plan: Anticipated discharge plan Is is home with self care.  Expected Discharge Date:                  Expected Discharge Plan:  Home/Self Care  In-House Referral:     Discharge planning Services  CM Consult  Post Acute Care Choice:  NA Choice offered to:     DME Arranged:    DME Agency:     HH Arranged:  NA HH Agency:     Status of Service:  Completed, signed off  If discussed at [place] of Stay Meetings, dates discussed:    Additional Comments:  Alvie Heidelberg, RN 12/12/2015, 3:05 PM

## 2015-12-12 NOTE — Progress Notes (Signed)
Pt tolerated sitting on side of bed 

## 2015-12-12 NOTE — Care Management Obs Status (Signed)
[place] NOTIFICATION   Patient Details  Name: Kara [name] MRN: [id number] Date of Birth: September 02, 1922   Medicare Observation Status Notification Given:  Yes    Alvie Heidelberg, RN 12/12/2015, 3:04 PM

## 2015-12-12 NOTE — Progress Notes (Signed)
Patient potassium 3.5, Dr Mack Guise notified. Orders to only give 2 bags of potassium. One bag already infusing, order placed for one more bag.

## 2015-12-12 NOTE — Progress Notes (Signed)
Patient is now in her hospital room. She is currently comfortable. Labs on admission revealed that the patient's potassium was 3.5. She was ordered for 20 mEq IV to insure her potassium is within normal limits by the time of surgery tomorrow morning.  Patient was seen by Dr. Vianne Bulls from the hospitalist service who has cleared her for surgery.  Patient has also been cleared by Dr. Nehemiah Massed, her cardiologist.  Patient is off of her coumadin in preparation for surgery per Dr. Alveria Apley recommendations.  It will be restarted after surgery.  BMP will be rechecked in the AM to ensure potassium is WNL prior to surgery.  Patient is likely to be discharged home tomorrow after surgery.

## 2015-12-13 ENCOUNTER — Encounter
Admission: RE | Disposition: A | Discharge status: Home / Self Care | Payer: Self-pay | Source: Ambulatory Visit | Attending: Orthopedic Surgery

## 2015-12-13 ENCOUNTER — Observation Stay: Payer: Medicare Other | Admitting: Certified Registered Nurse Anesthetist

## 2015-12-13 ENCOUNTER — Encounter: Payer: Self-pay | Admitting: Anesthesiology

## 2015-12-13 DIAGNOSIS — E876 Hypokalemia: Secondary | ICD-10-CM | POA: Diagnosis not present

## 2015-12-13 DIAGNOSIS — G5601 Carpal tunnel syndrome, right upper limb: Secondary | ICD-10-CM | POA: Diagnosis not present

## 2015-12-13 DIAGNOSIS — I4891 Unspecified atrial fibrillation: Secondary | ICD-10-CM | POA: Diagnosis not present

## 2015-12-13 DIAGNOSIS — G5600 Carpal tunnel syndrome, unspecified upper limb: Secondary | ICD-10-CM | POA: Diagnosis not present

## 2015-12-13 DIAGNOSIS — Z8542 Personal history of malignant neoplasm of other parts of uterus: Secondary | ICD-10-CM | POA: Diagnosis not present

## 2015-12-13 DIAGNOSIS — D649 Anemia, unspecified: Secondary | ICD-10-CM | POA: Diagnosis not present

## 2015-12-13 DIAGNOSIS — I2510 Atherosclerotic heart disease of native coronary artery without angina pectoris: Secondary | ICD-10-CM | POA: Diagnosis not present

## 2015-12-13 DIAGNOSIS — I10 Essential (primary) hypertension: Secondary | ICD-10-CM | POA: Diagnosis not present

## 2015-12-13 DIAGNOSIS — K529 Noninfective gastroenteritis and colitis, unspecified: Secondary | ICD-10-CM | POA: Diagnosis not present

## 2015-12-13 DIAGNOSIS — Z85828 Personal history of other malignant neoplasm of skin: Secondary | ICD-10-CM | POA: Diagnosis not present

## 2015-12-13 HISTORY — PX: CARPAL TUNNEL RELEASE: SHX101

## 2015-12-13 LAB — GLUCOSE, CAPILLARY
Glucose-Capillary: 120 mg/dL — ABNORMAL HIGH (ref 65–99)
Glucose-Capillary: 140 mg/dL — ABNORMAL HIGH (ref 65–99)

## 2015-12-13 LAB — SURGICAL PCR SCREEN
MRSA, PCR: NEGATIVE
Staphylococcus aureus: NEGATIVE

## 2015-12-13 LAB — BASIC METABOLIC PANEL
Anion gap: 9 (ref 5–15)
BUN: 18 mg/dL (ref 6–20)
CO2: 30 mmol/L (ref 22–32)
Calcium: 8.5 mg/dL — ABNORMAL LOW (ref 8.9–10.3)
Chloride: 104 mmol/L (ref 101–111)
Creatinine, Ser: 0.86 mg/dL (ref 0.44–1.00)
GFR calc Af Amer: >60 mL/min (ref >60)
GFR calc non Af Amer: 57 mL/min — ABNORMAL LOW (ref >60)
Glucose, Bld: 128 mg/dL — ABNORMAL HIGH (ref 65–99)
Potassium: 3.2 mmol/L — ABNORMAL LOW (ref 3.5–5.1)
Sodium: 143 mmol/L (ref 135–145)

## 2015-12-13 LAB — MAGNESIUM: Magnesium: 1.9 mg/dL (ref 1.7–2.4)

## 2015-12-13 LAB — POTASSIUM: Potassium: 3.4 mmol/L — ABNORMAL LOW (ref 3.5–5.1)

## 2015-12-13 SURGERY — CARPAL TUNNEL RELEASE
Anesthesia: General | Laterality: Right | Wound class: Clean

## 2015-12-13 MED ORDER — FENTANYL CITRATE (PF) 100 MCG/2ML IJ SOLN
INTRAMUSCULAR | Status: DC | PRN
  Administered 2015-12-13 (×3): 25 ug via INTRAVENOUS

## 2015-12-13 MED ORDER — FENTANYL CITRATE (PF) 100 MCG/2ML IJ SOLN
INTRAMUSCULAR | Status: AC
  Administered 2015-12-13: 25 ug via INTRAVENOUS
  Filled 2015-12-13: qty 2

## 2015-12-13 MED ORDER — ONDANSETRON HCL 4 MG/2ML IJ SOLN
INTRAMUSCULAR | Status: DC | PRN
  Administered 2015-12-13: 4 mg via INTRAVENOUS

## 2015-12-13 MED ORDER — GLYCOPYRROLATE 0.2 MG/ML IJ SOLN
INTRAMUSCULAR | Status: DC | PRN
  Administered 2015-12-13: 0.1 mg via INTRAVENOUS

## 2015-12-13 MED ORDER — DEXAMETHASONE SODIUM PHOSPHATE 10 MG/ML IJ SOLN
INTRAMUSCULAR | Status: DC | PRN
  Administered 2015-12-13: 5 mg via INTRAVENOUS

## 2015-12-13 MED ORDER — POTASSIUM CHLORIDE 10 MEQ/100ML IV SOLN
10.0000 meq | INTRAVENOUS | Status: AC
  Administered 2015-12-13: 10 meq via INTRAVENOUS
  Filled 2015-12-13 (×2): qty 100

## 2015-12-13 MED ORDER — CEFAZOLIN SODIUM-DEXTROSE 2-4 GM/100ML-% IV SOLN
2.0000 g | INTRAVENOUS | Status: AC
  Administered 2015-12-13: 2 g via INTRAVENOUS
  Filled 2015-12-13: qty 100

## 2015-12-13 MED ORDER — SODIUM CHLORIDE 0.9 % IV SOLN
INTRAVENOUS | Status: DC | PRN
  Administered 2015-12-13: 11:00:00 via INTRAVENOUS

## 2015-12-13 MED ORDER — ONDANSETRON HCL 4 MG PO TABS: 4.0000 mg | ORAL_TABLET | Freq: Three times a day (TID) | ORAL | 0 refills | Status: DC | PRN

## 2015-12-13 MED ORDER — PHENYLEPHRINE HCL 10 MG/ML IJ SOLN
INTRAMUSCULAR | Status: DC | PRN
  Administered 2015-12-13: 100 ug via INTRAVENOUS
  Administered 2015-12-13: 50 ug via INTRAVENOUS

## 2015-12-13 MED ORDER — POTASSIUM CHLORIDE 10 MEQ/100ML IV SOLN
10.0000 meq | Freq: Once | INTRAVENOUS | Status: AC
  Administered 2015-12-13: 10 meq via INTRAVENOUS
  Filled 2015-12-13: qty 100

## 2015-12-13 MED ORDER — LIDOCAINE HCL (CARDIAC) 20 MG/ML IV SOLN
INTRAVENOUS | Status: DC | PRN
  Administered 2015-12-13: 30 mg via INTRAVENOUS

## 2015-12-13 MED ORDER — FENTANYL CITRATE (PF) 100 MCG/2ML IJ SOLN
25.0000 ug | INTRAMUSCULAR | Status: DC | PRN
  Administered 2015-12-13 (×4): 25 ug via INTRAVENOUS

## 2015-12-13 MED ORDER — PROPOFOL 10 MG/ML IV BOLUS
INTRAVENOUS | Status: DC | PRN
  Administered 2015-12-13: 80 mg via INTRAVENOUS

## 2015-12-13 MED ORDER — HYDROCODONE-ACETAMINOPHEN 5-325 MG PO TABS: 1.0000 | ORAL_TABLET | ORAL | 0 refills | Status: DC | PRN

## 2015-12-13 MED ORDER — POTASSIUM CHLORIDE 10 MEQ/100ML IV SOLN: 10.0000 meq | INTRAVENOUS | Status: DC

## 2015-12-13 MED ORDER — ONDANSETRON HCL 4 MG/2ML IJ SOLN: 4.0000 mg | Freq: Once | INTRAMUSCULAR | Status: DC | PRN

## 2015-12-13 MED ORDER — NEOMYCIN-POLYMYXIN B GU 40-200000 IR SOLN
Status: DC | PRN
  Administered 2015-12-13: 2 mL

## 2015-12-13 SURGICAL SUPPLY — 43 items
BANDAGE ELASTIC 4 LF NS (GAUZE/BANDAGES/DRESSINGS) ×4 IMPLANT
BLADE SURG MINI STRL (BLADE) IMPLANT
BNDG ESMARK 4X12 TAN STRL LF (GAUZE/BANDAGES/DRESSINGS) ×2 IMPLANT
CANISTER SUCT 1200ML W/VALVE (MISCELLANEOUS) ×2 IMPLANT
CORD BIP STRL DISP 12FT (MISCELLANEOUS) ×2 IMPLANT
CUFF TOURN 18 STER (MISCELLANEOUS) ×2 IMPLANT
DRAPE IMP U-DRAPE 54X76 (DRAPES) ×2 IMPLANT
DRAPE SURG 17X11 SM STRL (DRAPES) ×2 IMPLANT
DURAPREP 26ML APPLICATOR (WOUND CARE) ×2 IMPLANT
ELECT REM PT RETURN 9FT ADLT (ELECTROSURGICAL) ×2
ELECTRODE REM PT RTRN 9FT ADLT (ELECTROSURGICAL) ×1 IMPLANT
FORCEPS JEWEL BIP 4-3/4 STR (INSTRUMENTS) ×2 IMPLANT
GAUZE FLUFF 18X24 1PLY STRL (GAUZE/BANDAGES/DRESSINGS) ×2 IMPLANT
GAUZE PETRO XEROFOAM 1X8 (MISCELLANEOUS) ×2 IMPLANT
GAUZE SPONGE 4X4 12PLY STRL (GAUZE/BANDAGES/DRESSINGS) ×2 IMPLANT
GLOVE BIOGEL PI IND STRL 9 (GLOVE) ×1 IMPLANT
GLOVE BIOGEL PI INDICATOR 9 (GLOVE) ×1
GLOVE SURG 9.0 ORTHO LTXF (GLOVE) ×4 IMPLANT
GOWN STRL REUS TWL 2XL XL LVL4 (GOWN DISPOSABLE) ×2 IMPLANT
GOWN STRL REUS W/ TWL LRG LVL3 (GOWN DISPOSABLE) ×1 IMPLANT
GOWN STRL REUS W/TWL LRG LVL3 (GOWN DISPOSABLE) ×1
KIT RM TURNOVER STRD PROC AR (KITS) ×2 IMPLANT
NEEDLE FILTER BLUNT 18X 1/2SAF (NEEDLE) ×1
NEEDLE FILTER BLUNT 18X1 1/2 (NEEDLE) ×1 IMPLANT
NS IRRIG 500ML POUR BTL (IV SOLUTION) ×2 IMPLANT
PACK EXTREMITY ARMC (MISCELLANEOUS) ×2 IMPLANT
PAD CAST CTTN 4X4 STRL (SOFTGOODS) ×2 IMPLANT
PADDING CAST 4IN STRL (MISCELLANEOUS) ×3
PADDING CAST BLEND 4X4 STRL (MISCELLANEOUS) ×3 IMPLANT
PADDING CAST COTTON 4X4 STRL (SOFTGOODS) ×2
SLING ARM LRG DEEP (SOFTGOODS) IMPLANT
SLING ARM M TX990204 (SOFTGOODS) ×2 IMPLANT
SPLINT CAST 1 STEP 3X12 (MISCELLANEOUS) ×2 IMPLANT
STOCKINETTE STRL 4IN 9604848 (GAUZE/BANDAGES/DRESSINGS) ×2 IMPLANT
STRIP CLOSURE SKIN 1/2X4 (GAUZE/BANDAGES/DRESSINGS) ×2 IMPLANT
SUT ETHILON 3-0 FS-10 30 BLK (SUTURE) ×2
SUT ETHILON 4 0 P 3 18 (SUTURE) ×2 IMPLANT
SUT ETHILON 4-0 (SUTURE) ×1
SUT ETHILON 4-0 FS2 18XMFL BLK (SUTURE) ×1
SUT ETHILON 5-0 FS-2 18 BLK (SUTURE) ×2 IMPLANT
SUTURE EHLN 3-0 FS-10 30 BLK (SUTURE) ×1 IMPLANT
SUTURE ETHLN 4-0 FS2 18XMF BLK (SUTURE) ×1 IMPLANT
SYR 3ML LL SCALE MARK (SYRINGE) ×2 IMPLANT

## 2015-12-13 NOTE — OR Nursing (Signed)
Pt arrived from inpt room with IV left arm infusing NS via pump, and SL to right lower arm - Per Dr. Marcello Moores, do not remove SL from right arm

## 2015-12-13 NOTE — Anesthesia Procedure Notes (Signed)
Procedure Name: LMA Insertion Date/Time: 12/13/2015 11:19 AM Performed by: Johnna Acosta Pre-anesthesia Checklist: Patient identified, Emergency Drugs available, Suction available, Patient being monitored and Timeout performed Patient Re-evaluated:Patient Re-evaluated prior to inductionOxygen Delivery Method: Circle system utilized Preoxygenation: Pre-oxygenation with 100% oxygen Intubation Type: IV induction Ventilation: Mask ventilation without difficulty LMA: LMA inserted LMA Size: 3.0 Tube type: Oral Number of attempts: 2

## 2015-12-13 NOTE — Progress Notes (Signed)
Pharmacy consulted to monitor and replete electrolytes if needed. Per MD - replacement should be IV formulation due to absorption issues with PO.  Follow up Mg 1.9 (WNL), K 3.4 (slightly low) Will order KCl 10 mEq IV x 1 dose  Follow up with labs tomorrow AM.  Lenis Noon, PharmD, BCPS Clinical Pharmacist 12/13/15 4:47 PM

## 2015-12-13 NOTE — OR Nursing (Signed)
Dr. Mack Guise in to see pt 1040 am - SL to right arm removed as instructed - Dr. Marcello Moores notified of same

## 2015-12-13 NOTE — Transfer of Care (Signed)
Immediate Anesthesia Transfer of Care Note  Patient: Kara [name]  Procedure(s) Performed: Procedure(s): CARPAL TUNNEL RELEASE (Right)  Patient Location: PACU  Anesthesia Type:General  Level of Consciousness: awake  Airway & Oxygen Therapy: Patient Spontanous Breathing and Patient connected to face mask oxygen  Post-op Assessment: Report given to RN and Post -op Vital signs reviewed and stable  Post vital signs: Reviewed and stable  Last Vitals:  Vitals:   12/13/15 1209 12/13/15 1210  BP: (P) 118/86 118/86  Pulse:  80  Resp:  14  Temp: (P) 36.6 C 36.6 C    Last Pain:  Vitals:   12/13/15 1036  TempSrc: Tympanic  PainSc:          Complications: No apparent anesthesia complications

## 2015-12-13 NOTE — Progress Notes (Signed)
Pt runs low K chronically so I m not concerned about K 3.2 (we r still agressively repleting it). She is ok to go for planned surgery today.

## 2015-12-13 NOTE — Progress Notes (Signed)
DISCHARGE NOTE:  Pt given discharge instruction and prescriptions. Pt verbalized understanding. Pt wheeled to car by staff.

## 2015-12-13 NOTE — Op Note (Signed)
  12/12/2015 - 12/13/2015  12:27 PM  PATIENT:  Kara [name]    PRE-OPERATIVE DIAGNOSIS:  G56.01: Carpal tunnel syndrome, right upper limb  POST-OPERATIVE DIAGNOSIS:  Same  PROCEDURE:  RIGHT OPEN CARPAL TUNNEL RELEASE  SURGEON:  Thornton Park, MD  ANESTHESIA:   General  PREOPERATIVE INDICATIONS:  Kara [name] is a  82 y.o. female with a diagnosis of G56.01: Carpal tunnel syndrome, right upper limb who failed conservative measures and elected for surgical management.    I discussed the risks and benefits of surgery. The risks include but are not limited to infection, bleeding, nerve or blood vessel injury, joint stiffness or loss of motion, persistent pain, weakness, recurrence of symptoms and the need for further surgery. Medical risks include but are not limited to DVT and pulmonary embolism, myocardial infarction, stroke, pneumonia, respiratory failure and death. Patient understood these risks and wished to proceed.   OPERATIVE FINDINGS: Significant median nerve compression at the carpal tunnel, right upper extremity  OPERATIVE PROCEDURE: Patient was met in the preoperative area. I signed the right wrist with my initials and the word yes according the hospital's correct site of surgery protocol. The H&P was updated. I answered all the patient's questions. She was then brought to the operating room where she underwent general with anesthesia with an LMA.  She was positioned supine on the operative table. The right arm was placed on a hand table. A tourniquet was applied to the right upper extremity. The right upper extremity was prepped and draped in a sterile fashion. A timeout was performed to verify the patient's name, date of birth, medical record number, correct site of surgery correct procedure to be performed. The time out was also used to confirm the patient received antibiotics that all necessary instruments were available in the room. The right upper extremity was then  exsanguinated with an Esmarch and the tourniquet inflated to 250 mmHg.   An incision following the palmar crease was made. This was made in line with the web space between the middle and ring fingers and the distal extent of the incision was where it intersected Kaplan's cardinal line. The subcutaneous tissue was carefully dissected out with a Metzenbaum scissor and pickup until the palmar fascia was encountered. The distal extent of the transverse carpal ligament was then identified. A Freer elevator was placed under the transverse carpal ligament running distally to proximally. A micro-Beaver blade was then used to incise the transverse carpal ligament taking care to avoid injury to any neurovascular structures. The carpal tunnel was found to be extremely constricted. There was significant compression on the median nerve. The transverse carpal ligament was completely released. The nerve was visualized in its entirety after the carpal tunnel release.   The wound was copiously irrigated. The skin was then approximated with 4-0 nylon. Xeroform was placed over the incision. A dry sterile dressing was applied along with a volar fiberglass splint. Patient was overwrapped with an Ace wrap. The tourniquet was deflated at 26 minutes.   She was extubated and brought to the PACU in stable condition. I was scrubbed and present the entire case and all sharp and instrument counts were correct at the conclusion the case.  I  Spoke with the patient's daughter in the post-op consultation room to let her know the case was performed without complication and the patient was stable in the recovery room.     Timoteo Gaul, MD

## 2015-12-13 NOTE — Progress Notes (Signed)
[facility] NAME: Bibi Sise    MR#:  [id number]  DATE OF BIRTH:  10-Mar-1923  SUBJECTIVE:  CHIEF COMPLAINT:  No chief complaint on file. waiting for surgery. No complaints, K 3.2 this am REVIEW OF SYSTEMS:  Review of Systems  Constitutional: Negative for chills, fever and weight loss.  HENT: Negative for nosebleeds and sore throat.   Eyes: Negative for blurred vision.  Respiratory: Negative for cough, shortness of breath and wheezing.   Cardiovascular: Negative for chest pain, orthopnea, leg swelling and PND.  Gastrointestinal: Negative for abdominal pain, constipation, diarrhea, heartburn, nausea and vomiting.  Genitourinary: Negative for dysuria and urgency.  Musculoskeletal: Negative for back pain.       RUE pain  Skin: Negative for rash.  Neurological: Negative for dizziness, speech change, focal weakness and headaches.  Endo/Heme/Allergies: Does not bruise/bleed easily.  Psychiatric/Behavioral: Negative for depression.   DRUG ALLERGIES:   Allergies  Allergen Reactions  . Baycol [Cerivastatin Sodium]   . Cardizem [Diltiazem Hcl]     LOWER EXTREMITY EDEMA  . Crestor [Rosuvastatin Calcium] Other (See Comments)    INTOLERANT  . Dronedarone Other (See Comments)    Fatigue  . Metoprolol Tartrate Other (See Comments)  . Omeprazole Other (See Comments)  . Pravachol     INTOLERANT  . Statins Other (See Comments)  . Vioxx [Rofecoxib]   . Zocor [Simvastatin]     INTOLERANT   VITALS:  Blood pressure 94/67, pulse 70, temperature 97.7 F (36.5 C), resp. rate 19, height 5\' 2"  (1.575 m), weight 59.3 kg (130 lb 11.7 oz), SpO2 100 %. PHYSICAL EXAMINATION:  Physical Exam  Musculoskeletal:       Right hand: Decreased sensation noted. Decreased sensation is present in the radial distribution.  Right hand: Patient with numbness in the thumb index and middle finger. There is moderate hyperthenar eminence atrophy. Patient can  flex and extend all 5 digits of the right hand. Her fingers are well-perfused and she has a palpable radial pulse.   LABORATORY PANEL:   CBC  Recent Labs Lab 12/12/15 1255  WBC 7.5  HGB 14.1  HCT 41.8  PLT 164   ------------------------------------------------------------------------------------------------------------------ Chemistries   Recent Labs Lab 12/13/15 0412  NA 143  K 3.2*  CL 104  CO2 30  GLUCOSE 128*  BUN 18  CREATININE 0.86  CALCIUM 8.5*   RADIOLOGY:  No results found. ASSESSMENT AND PLAN:   #1. Hypokalemia - repleting agressively IV, will recheck, check mg - can consider potassium powder on D/C - may be overall difficult as may not get absorption due to bowel surgery  #2. history of atrial fibrillation:  continue Cardizem and metoprolol - rate controlled Resume coumadin post surgery per ortho  #3 Carpal tunnel syndrome - mgmt per ortho   All the records are reviewed and case discussed with Care Management/Social Worker. Management plans discussed with the patient, nursing and they are in agreement.  CODE STATUS: full code  TOTAL TIME TAKING CARE OF THIS PATIENT: 15 minutes.   More than 50% of the time was spent in counseling/coordination of care: Augustina Mood, Clarrisa Kaylor M.D on 12/13/2015 at 2:11 PM  Between 7am to 6pm - Pager - [phone]  After 6pm go to [email or web address] - Proofreader  [facility]  Office  [phone]  CC: Primary care physician; Rica Mast, MD  Note: This dictation was prepared with Dragon dictation along with smaller  Company secretary. Any transcriptional errors that result from this process are unintentional.

## 2015-12-13 NOTE — Anesthesia Preprocedure Evaluation (Addendum)
Anesthesia Evaluation  Patient identified by MRN, date of birth, ID band Patient awake    Reviewed: Allergy & Precautions, NPO status , Patient's Chart, lab work & pertinent test results, reviewed documented beta blocker date and time   Airway Mallampati: II  TM Distance: >3 FB     Dental  (+) Chipped   Pulmonary sleep apnea , former smoker,           Cardiovascular hypertension, + CAD       Neuro/Psych Anxiety  Neuromuscular disease    GI/Hepatic GERD  Controlled,  Endo/Other  diabetes, Type 2  Renal/GU      Musculoskeletal  (+) Arthritis ,   Abdominal   Peds  Hematology  (+) anemia ,   Anesthesia Other Findings Pacemaker. On coumadin. K 3.2, !) meq of K running. Will proceed. MVR. Pt denies severe regurg. Does not use CPAP.  Reproductive/Obstetrics                            Anesthesia Physical Anesthesia Plan  ASA: III  Anesthesia Plan: General   Post-op Pain Management:    Induction: Intravenous  Airway Management Planned: LMA  Additional Equipment:   Intra-op Plan:   Post-operative Plan:   Informed Consent: I have reviewed the patients History and Physical, chart, labs and discussed the procedure including the risks, benefits and alternatives for the proposed anesthesia with the patient or authorized representative who has indicated his/her understanding and acceptance.     Plan Discussed with: CRNA  Anesthesia Plan Comments:         Anesthesia Quick Evaluation

## 2015-12-13 NOTE — Anesthesia Postprocedure Evaluation (Signed)
Anesthesia Post Note  Patient: Kara [name]  Procedure(s) Performed: Procedure(s) (LRB): CARPAL TUNNEL RELEASE (Right)  Patient location during evaluation: PACU Anesthesia Type: General Level of consciousness: awake and alert Pain management: pain level controlled Vital Signs Assessment: post-procedure vital signs reviewed and stable Respiratory status: spontaneous breathing, nonlabored ventilation, respiratory function stable and patient connected to nasal cannula oxygen Cardiovascular status: blood pressure returned to baseline and stable Postop Assessment: no signs of nausea or vomiting Anesthetic complications: no    Last Vitals:  Vitals:   12/13/15 1737 12/13/15 1819  BP:  139/79  Pulse:  72  Resp:  18  Temp: 36.6 C 36.2 C    Last Pain:  Vitals:   12/13/15 1819  TempSrc: Oral  PainSc:                  [name] S

## 2015-12-13 NOTE — OR Nursing (Signed)
abx taken to OR by CRNA

## 2015-12-14 ENCOUNTER — Telehealth: Payer: Self-pay | Admitting: Internal Medicine

## 2015-12-14 NOTE — Telephone Encounter (Signed)
I just tell patients that we will ask Dr. Nicki Reaper or Dr. Derrel Nip if they request them. Will have to ask Dr. Nicki Reaper.

## 2015-12-14 NOTE — Telephone Encounter (Signed)
Has this been discussed? Please advise thanks

## 2015-12-14 NOTE — Telephone Encounter (Signed)
Okay; thanks.

## 2015-12-14 NOTE — Telephone Encounter (Signed)
Pt called about wanting to know if Dr Nicki Reaper was notified about her being her pt. Pt stated that Dr Gilford Rile stated to pt that Dr Gilford Rile would set her up with a appt with Dr Nicki Reaper. I do know that Dr Nicki Reaper is not taking any pt's. :)  Please advise?  Call pt @ [phone]. Thank you!

## 2015-12-20 ENCOUNTER — Telehealth: Payer: Self-pay | Admitting: *Deleted

## 2015-12-20 DIAGNOSIS — I4891 Unspecified atrial fibrillation: Secondary | ICD-10-CM | POA: Diagnosis not present

## 2015-12-20 DIAGNOSIS — Z7901 Long term (current) use of anticoagulants: Secondary | ICD-10-CM

## 2015-12-20 NOTE — Telephone Encounter (Signed)
MDINR stated that pt had a out of range INR of 1.5

## 2015-12-20 NOTE — Telephone Encounter (Signed)
Please advise INR 1.5 thanks

## 2015-12-20 NOTE — Telephone Encounter (Signed)
Please have her come to office to check INR, as the MD INR may be inaccurate at times.

## 2015-12-21 DIAGNOSIS — R601 Generalized edema: Secondary | ICD-10-CM | POA: Diagnosis not present

## 2015-12-21 DIAGNOSIS — I2510 Atherosclerotic heart disease of native coronary artery without angina pectoris: Secondary | ICD-10-CM | POA: Diagnosis not present

## 2015-12-21 DIAGNOSIS — E876 Hypokalemia: Secondary | ICD-10-CM | POA: Diagnosis not present

## 2015-12-21 DIAGNOSIS — I495 Sick sinus syndrome: Secondary | ICD-10-CM | POA: Diagnosis not present

## 2015-12-21 NOTE — Telephone Encounter (Signed)
Please schedule a INR/ Lab draw, thanks

## 2015-12-21 NOTE — Telephone Encounter (Signed)
Scheduled

## 2015-12-24 ENCOUNTER — Other Ambulatory Visit (INDEPENDENT_AMBULATORY_CARE_PROVIDER_SITE_OTHER): Payer: Medicare Other

## 2015-12-24 ENCOUNTER — Telehealth: Payer: Self-pay | Admitting: *Deleted

## 2015-12-24 DIAGNOSIS — Z7901 Long term (current) use of anticoagulants: Secondary | ICD-10-CM

## 2015-12-24 DIAGNOSIS — E876 Hypokalemia: Secondary | ICD-10-CM | POA: Diagnosis not present

## 2015-12-24 LAB — BASIC METABOLIC PANEL
BUN: 15 mg/dL (ref 6–23)
CO2: 27 mEq/L (ref 19–32)
Calcium: 9.2 mg/dL (ref 8.4–10.5)
Chloride: 103 mEq/L (ref 96–112)
Creatinine, Ser: 0.85 mg/dL (ref 0.40–1.20)
GFR: 66.59 mL/min (ref >60.00)
Glucose, Bld: 173 mg/dL — ABNORMAL HIGH (ref 70–99)
Potassium: 3.6 mEq/L (ref 3.5–5.1)
Sodium: 140 mEq/L (ref 135–145)

## 2015-12-24 LAB — PROTIME-INR
INR: 1.5 ratio — ABNORMAL HIGH (ref 0.8–1.0)
Prothrombin Time: 16.4 s — ABNORMAL HIGH (ref 9.6–13.1)

## 2015-12-24 NOTE — Addendum Note (Signed)
Addended by: Leeanne Rio on: 12/24/2015 03:33 PM   Modules accepted: Orders

## 2015-12-24 NOTE — Telephone Encounter (Signed)
This patient was under the impression that she would be a patient of Dr. Bary Leriche now, please advise if you are okay with this and if so she is wanting to add labs to her scheduled ones today, thanks

## 2015-12-24 NOTE — Telephone Encounter (Signed)
Dr. Nicki Reaper unable to accommodate her appt needs at this point, can she establish with either Joycelyn Schmid or one of the other fmaily PCPs? Can you assist with this?

## 2015-12-24 NOTE — Telephone Encounter (Signed)
Patient recently hospitalized for carpal tunnel syndrome and surgery. At that time her potassium is 3.4. Rechecking today.

## 2015-12-24 NOTE — Telephone Encounter (Signed)
Pt came in today to get labs & asked about the Potassium. Pt states that she needed her Potassium rechecked from her hospital visit. I need an order placed soon if you are okay with this test (place lab as "normal" not "future")

## 2015-12-24 NOTE — Telephone Encounter (Signed)
The patient has been scheduled with Vidal Schwalbe , NP.

## 2015-12-24 NOTE — Telephone Encounter (Signed)
To clarify, is patient establishing with me?   Or Dr. Nicki Reaper?   Happy to see her and have her est with me.

## 2015-12-24 NOTE — Telephone Encounter (Signed)
Called the patient her line is busy. Will try again later

## 2015-12-24 NOTE — Telephone Encounter (Signed)
Was not aware.  Per review of note, wanted a f/u in four weeks from last visit.  See me about this - with scheduling.

## 2015-12-24 NOTE — Telephone Encounter (Addendum)
Patient requested to have a potassium lab added to her lab appt this afternoon.   She also requested to have a call in regards to a new medication, she wanted direction for this medication.  Pt contact [phone]

## 2015-12-25 NOTE — Telephone Encounter (Signed)
Given need for soon f/u , etc - per note, Kara [name] scheduled establish care appt with you.  Let me know if problems.

## 2015-12-25 NOTE — Telephone Encounter (Signed)
I think this should have been sent to you 

## 2015-12-25 NOTE — Telephone Encounter (Signed)
Sounds great. Patient establishes with me.   Kara [name], which she change her PCP to me?

## 2015-12-26 ENCOUNTER — Encounter: Payer: Self-pay | Admitting: Occupational Therapy

## 2015-12-26 ENCOUNTER — Ambulatory Visit: Payer: Medicare Other | Attending: Orthopedic Surgery | Admitting: Occupational Therapy

## 2015-12-26 DIAGNOSIS — R208 Other disturbances of skin sensation: Secondary | ICD-10-CM

## 2015-12-26 DIAGNOSIS — M6281 Muscle weakness (generalized): Secondary | ICD-10-CM | POA: Diagnosis not present

## 2015-12-26 DIAGNOSIS — M25641 Stiffness of right hand, not elsewhere classified: Secondary | ICD-10-CM | POA: Diagnosis not present

## 2015-12-26 NOTE — Therapy (Signed)
[facility] [address] [place], [address] Phone: [phone]   Fax:  [phone]  Occupational Therapy Treatment  Patient Details  Name: Kara [name] MRN: [id number] Date of Birth: May 03, 1923 Referring Provider: Mack Guise  Encounter Date: 12/26/2015      OT End of Session - 12/26/15 1422    Visit Number 1   Number of Visits 6   Date for OT Re-Evaluation 02/06/16   OT Start Time 1331   OT Stop Time 1418   OT Time Calculation (min) 47 min   Activity Tolerance Patient tolerated treatment well   Behavior During Therapy [facility] for tasks assessed/performed      Past Medical History:  Diagnosis Date  . Allergy   . Atrial fibrillation ([place])   . CAD (coronary artery disease)   . Cancer ([facility])    UTERINE  . Chronic anticoagulation   . Chronic diarrhea   . Decubitus ulcer    sacral region  . Diabetes ([place])    diet controlled  . Dysuria   . Edema of lower extremity    mainly right foot, slightly in left foot.  . Fibrocystic breast disease   . GERD (gastroesophageal reflux disease)   . Glaucoma   . Glaucoma   . Hematuria   . Hemorrhoids   . History of colon polyps   . History of pancreatitis   . Hyperlipidemia   . Hypertension   . Hypokalemia   . IBS (irritable bowel syndrome)   . Microscopic hematuria   . Mitral valve regurgitation   . Osteoarthritis    fingers  . Pernicious anemia   . Plantar fasciitis   . Recurrent UTI   . Skin cancer   . Sleep apnea, obstructive   . Vaginal atrophy   . Vitamin D deficiency   . Yeast vaginitis     Past Surgical History:  Procedure Laterality Date  . ABDOMINAL HYSTERECTOMY  1980's  . ABDOMINAL SURGERY     for villous polyp,,,many years ago  . APPENDECTOMY  1940's  . ASCAD, s/p PTCA  11/28/2005   MID LESION   . BREAST BIOPSY Left 1970's  . CARPAL TUNNEL RELEASE Right 12/13/2015   Procedure: CARPAL TUNNEL RELEASE;  Surgeon: Thornton Park, MD;   Location: [facility] ORS;  Service: Orthopedics;  Laterality: Right;  . COLECTOMY    . PACEMAKER PLACEMENT    . radation     for uterine cance  . REFRACTIVE SURGERY     for bilateral glaumoma  . TONSILLECTOMY  1936    There were no vitals filed for this visit.      Subjective Assessment - 12/26/15 1341    Subjective  Symptoms prior to surgery was just some pins and needles in fingers - and now I have hard time picking up small objects, hand strength  not as good , writing, clipping nails    Patient Stated Goals I want my hand better - like it was before - not to drop objects , pick up small objects    Currently in Pain? No/denies            [facility] OT Assessment - 12/26/15 0001      Assessment   Diagnosis R CTR   Referring Provider Mack Guise   Onset Date 12/13/15     Precautions   Required Braces or Orthoses Other Brace/Splint     Home  Environment   Lives With Other (Comment)  [place]  apartments     Prior Function   Vocation Retired   Leisure likes to play cards, read , visit with friencs - R hand dominant      AROM   Right Wrist Extension 60 Degrees   Right Wrist Flexion 75 Degrees   Left Wrist Extension 65 Degrees   Left Wrist Flexion 70 Degrees     Right Hand AROM   R Thumb Opposition to Index --  Opposition to base of 5th    R Index  MCP 0-90 90 Degrees   R Index PIP 0-100 95 Degrees   R Long  MCP 0-90 80 Degrees   R Long PIP 0-100 95 Degrees   R Ring  MCP 0-90 80 Degrees   R Ring PIP 0-100 100 Degrees   R Little  MCP 0-90 80 Degrees   R Little PIP 0-100 100 Degrees       Reviewed HEP for ice , tendon glides  8 reps , opposition  and Med N glides3-5 reps   2-3 x day                     OT Education - 12/26/15 1421    Education provided Yes   Education Details HEP   Person(s) Educated Patient   Methods Explanation;Demonstration;Verbal cues;Tactile cues;Handout   Comprehension Verbalized understanding;Returned  demonstration;Verbal cues required          OT Short Term Goals - 12/26/15 1426      OT SHORT TERM GOAL #1   Title Pt to be independent in HEP to increase ROM, functional use , and strength   Baseline very little knowledge of HEP    Time 3   Period Weeks   Status New     OT SHORT TERM GOAL #2   Title AROM in digits and wrist improve to WNL to write, bathing and dressing with no issues   Baseline See flowsheet for wrist and digits - impaired    Time 3   Period Weeks   Status New           OT Long Term Goals - 12/26/15 1433      OT LONG TERM GOAL #1   Title R Grip strength improve to at least 50% compare to L to turn doorknob , carry 5 lbs    Baseline NT - stitches still in    Time 4   Period Weeks   Status New     OT LONG TERM GOAL #2   Title Function on PRWHE improve with at least 20 points    Baseline Function at Eval 28.5/50    Time 6   Period Weeks   Status New     OT LONG TERM GOAL #3   Title Pt report improvement in sensory issues - to tell textures, write, do buttons and play cards    Baseline Pt report pins and needles and numb feeling thumb thru 4th    Time 6   Period Weeks   Status New               Plan - 12/26/15 1423    Clinical Impression Statement Pt present 2 wks s/p R CTR - pt report still having some sensation issues at thumb thru 4th - trouble with grip , FMC and using hand - pt still have stitches in and bandages over - pt has appt with MD on Monday to get stitches out - pt show decrease AROM for composite  fist, wrist AROM  - pt can benefit from OT services    Rehab Potential Good   OT Frequency 1x / week   OT Duration 6 weeks   OT Treatment/Interventions Self-care/ADL training;Cryotherapy;Ultrasound;Fluidtherapy;Patient/family education;Therapeutic exercises;Contrast Bath;Scar mobilization;Passive range of motion;Manual Therapy   Plan assess scar healing- HEP    OT Home Exercise Plan see pt instruction    Consulted and Agree with  Plan of Care Patient      Patient will benefit from skilled therapeutic intervention in order to improve the following deficits and impairments:  Decreased coordination, Decreased range of motion, Impaired flexibility, Impaired sensation, Decreased knowledge of precautions, Pain, Impaired UE functional use, Decreased scar mobility, Decreased strength  Visit Diagnosis: Stiffness of right hand, not elsewhere classified - Plan: Ot plan of care cert/re-cert  Muscle weakness (generalized) - Plan: Ot plan of care cert/re-cert  Other disturbances of skin sensation - Plan: Ot plan of care cert/re-cert    Problem List Patient Active Problem List   Diagnosis Date Noted  . Hypokalemia 12/12/2015  . Atrophic vaginitis 09/26/2015  . Umbilical hernia without obstruction and without gangrene 06/26/2015  . Elevated blood sugar 05/29/2015  . Carpal tunnel syndrome of right wrist 05/29/2015  . Chronic fatigue 05/01/2015  . Left leg pain 05/01/2015  . Closed fracture of part of upper end of humerus 01/29/2015  . Skin lesion 01/29/2015  . Recurrent UTI 11/21/2014  . Vaginal atrophy 11/21/2014  . Urethral caruncle 11/21/2014  . Mitral valve regurgitation 05/09/2014  . History of pancreatitis 03/09/2014  . Medicare annual wellness visit, subsequent 10/06/2012  . Hemorrhoids 06/08/2012  . Osteoarthritis 03/08/2012  . Screening for breast cancer 03/08/2012  . Pernicious anemia 02/04/2012  . History of colectomy 02/04/2012  . Vitamin D deficiency 07/30/2011  . Chronic anticoagulation 06/13/2011  . Atrial fibrillation ([place]) 06/13/2011  . CAD (coronary artery disease) 06/13/2011  . Chronic diarrhea 06/13/2011    Rosalyn Gess OTR/L,CLT  12/26/2015, 2:40 PM  [facility] [address], [place], [address] Phone: [phone]   Fax:  [phone]  Name: LEILYN JURKOWSKI MRN: [id number] Date of Birth: [birth year]

## 2015-12-26 NOTE — Patient Instructions (Signed)
Tendon glides  Med N glides  2-3 x day -and can do ice as needed at end

## 2015-12-27 ENCOUNTER — Telehealth: Payer: Self-pay | Admitting: *Deleted

## 2015-12-27 ENCOUNTER — Ambulatory Visit: Payer: Medicare Other | Admitting: Internal Medicine

## 2015-12-27 ENCOUNTER — Encounter: Payer: Self-pay | Admitting: Family

## 2015-12-27 ENCOUNTER — Ambulatory Visit (INDEPENDENT_AMBULATORY_CARE_PROVIDER_SITE_OTHER): Payer: Medicare Other | Admitting: Family

## 2015-12-27 VITALS — BP 110/68 | HR 84 | Temp 97.5°F | Ht 62.0 in | Wt 128.6 lb

## 2015-12-27 DIAGNOSIS — R350 Frequency of micturition: Secondary | ICD-10-CM

## 2015-12-27 DIAGNOSIS — B379 Candidiasis, unspecified: Secondary | ICD-10-CM

## 2015-12-27 DIAGNOSIS — R300 Dysuria: Secondary | ICD-10-CM

## 2015-12-27 DIAGNOSIS — T3695XA Adverse effect of unspecified systemic antibiotic, initial encounter: Principal | ICD-10-CM

## 2015-12-27 DIAGNOSIS — N3941 Urge incontinence: Secondary | ICD-10-CM | POA: Insufficient documentation

## 2015-12-27 LAB — POCT URINALYSIS DIPSTICK
Bilirubin, UA: NEGATIVE
Glucose, UA: NEGATIVE
Ketones, UA: NEGATIVE
Nitrite, UA: NEGATIVE
Spec Grav, UA: <=1.005
Urobilinogen, UA: 0.2
pH, UA: 5.5

## 2015-12-27 MED ORDER — CEFDINIR 300 MG PO CAPS: 300.0000 mg | ORAL_CAPSULE | Freq: Two times a day (BID) | ORAL | 0 refills | Status: AC

## 2015-12-27 NOTE — Assessment & Plan Note (Addendum)
Urinalysis is positive for leukocytes and blood. Will treat empirically for urinary tract infection. Pending culture. Advised patient to check her INR in the next 2 and 3 days after she checked it today to ensure the antibiotic is not affecting the therapeutic level.

## 2015-12-27 NOTE — Telephone Encounter (Signed)
Please advise with INR reading below, thanks

## 2015-12-27 NOTE — Progress Notes (Signed)
Subjective:    Patient ID: Kara [name], female    DOB: [birth year], 82 y.o.   MRN: [id number]  CC: Kara [name] is a 82 y.o. female who presents today for an acute visit.    HPI: Patient here to establish care and for an acute visit, she complains of urinary frequency and burning for one week. Per patient, her cardiologist started her on a diuretic which she cannot recall the name. This medication is not listed in Epic.   Patient's INR was last subtherapeutic 1.5, she will do INR today at home which is called into our office. Stopped taking coumadin for 4 days prior to surgery. Carpal tunnel release 2 weeks ago.    Checks INR weekly at home. Coumadin managed by PCP. No bleeding from gums, nose. No blood in stool.  Follows with cardiologist, vascular ( for chronic RLE swelling for years), dermatologist.   No longer does Pap smear or colonoscopy; mammogram this year, normal.  DEXA 2017 shows osteopenia. Patient on calcium.     HISTORY:  Past Medical History:  Diagnosis Date  . Allergy   . Atrial fibrillation ([place])   . CAD (coronary artery disease)   . Cancer (HCC)    UTERINE  . Chronic anticoagulation   . Chronic diarrhea   . Decubitus ulcer    sacral region  . Diabetes ([place])    diet controlled  . Dysuria   . Edema of lower extremity    mainly right foot, slightly in left foot.  . Fibrocystic breast disease   . GERD (gastroesophageal reflux disease)   . Glaucoma   . Glaucoma   . Hematuria   . Hemorrhoids   . History of colon polyps   . History of pancreatitis   . Hyperlipidemia   . Hypertension   . Hypokalemia   . IBS (irritable bowel syndrome)   . Microscopic hematuria   . Mitral valve regurgitation   . Osteoarthritis    fingers  . Pernicious anemia   . Plantar fasciitis   . Recurrent UTI   . Skin cancer   . Sleep apnea, obstructive   . Vaginal atrophy   . Vitamin D deficiency   . Yeast vaginitis    Past Surgical History:  Procedure Laterality Date  .  ABDOMINAL HYSTERECTOMY  1980's  . ABDOMINAL SURGERY     for villous polyp,,,many years ago  . APPENDECTOMY  1940's  . ASCAD, s/p PTCA  11/28/2005   MID LESION   . BREAST BIOPSY Left 1970's  . CARPAL TUNNEL RELEASE Right 12/13/2015   Procedure: CARPAL TUNNEL RELEASE;  Surgeon: Thornton Park, MD;  Location: [facility] ORS;  Service: Orthopedics;  Laterality: Right;  . COLECTOMY    . PACEMAKER PLACEMENT    . radation     for uterine cance  . REFRACTIVE SURGERY     for bilateral glaumoma  . TONSILLECTOMY  1936   Family History  Problem Relation Age of Onset  . Coronary artery disease Father   . Kidney disease Father   . Cancer Sister   . Diabetes    . Cancer Mother     COLON  . Bladder Cancer Neg Hx   . Breast cancer Neg Hx     Allergies: Baycol [cerivastatin sodium]; Cardizem [diltiazem hcl]; Crestor [rosuvastatin calcium]; Dronedarone; Metoprolol tartrate; Omeprazole; Pravachol; Statins; Vioxx [rofecoxib]; and Zocor [simvastatin] Current Outpatient Prescriptions on File Prior to Visit  Medication Sig Dispense Refill  . cholecalciferol (VITAMIN D) 1000 UNITS tablet  Take 1,000 Units by mouth daily.    . cyanocobalamin (,VITAMIN B-12,) 1000 MCG/ML injection INJECT 1 ML  INTO THE MUSCLE EVERY 30 DAYS. 1 mL 10  . diltiazem (CARDIZEM SR) 120 MG 12 hr capsule Take 120 mg by mouth every morning.    . diphenoxylate-atropine (LOMOTIL) 2.5-0.025 MG tablet TAKE 1 TABLET FOUR TIMES DAILY AS NEEDED FOR DIARHREA 90 tablet 0  . estradiol (ESTRACE) 0.1 MG/GM vaginal cream Place 1 Applicatorful vaginally at bedtime. (Patient taking differently: Place 1 Applicatorful vaginally daily as needed. ) 42.5 g 0  . HYDROcodone-acetaminophen (NORCO) 5-325 MG tablet Take 1-2 tablets by mouth every 4 (four) hours as needed for moderate pain. MAXIMUM TOTAL ACETAMINOPHEN DOSE IS 4000 MG PER DAY 40 tablet 0  . hydrocortisone-pramoxine (ANALPRAM-HC) 2.5-1 % rectal cream APPLY RECTALLY THREE TIMES DAILY (Patient taking  differently: APPLY RECTALLY THREE TIMES DAILY AS NEEDED) 30 g 0  . Hypromellose (GENTEAL) 0.3 % SOLN Place 1 drop into both eyes daily as needed (dryness).     Marland Kitchen lidocaine (LIDODERM) 5 % Place 1 patch onto the skin daily. Remove & Discard patch within 12 hours or as directed by MD 30 patch 0  . metoprolol (LOPRESSOR) 50 MG tablet TAKE 1 TABLET BY MOUTH 2  TIMES DAILY. (Patient taking differently: 25mg  in the morning and 25mg  in the evening) 60 tablet 4  . ondansetron (ZOFRAN) 4 MG tablet Take 1 tablet (4 mg total) by mouth every 8 (eight) hours as needed for nausea or vomiting. 30 tablet 0  . triamcinolone cream (KENALOG) 0.1 % Apply 1 application topically 2 (two) times daily. (Patient taking differently: Apply 1 application topically 2 (two) times daily as needed. ) 30 g 0  . warfarin (COUMADIN) 1 MG tablet Take 1 mg by mouth daily at 6 PM.     No current facility-administered medications on file prior to visit.     Social History  Substance Use Topics  . Smoking status: Former Research scientist (life sciences)  . Smokeless tobacco: Never Used     Comment: quit 45 years ago  . Alcohol use 0.0 oz/week     Comment: Rarely, 1-2 times a year    Review of Systems  Constitutional: Negative for chills and fever.  Respiratory: Negative for cough.   Cardiovascular: Negative for chest pain and palpitations.  Gastrointestinal: Negative for nausea and vomiting.      Objective:    BP 110/68 (BP Location: Left Arm, Patient Position: Sitting, Cuff Size: Normal)   Pulse 84   Temp 97.5 F (36.4 C) (Oral)   Ht 5\' 2"  (1.575 m)   Wt 128 lb 9.6 oz (58.3 kg)   SpO2 95%   BMI 23.52 kg/m    Physical Exam  Constitutional: She appears well-developed and well-nourished.  Cardiovascular: Normal rate, regular rhythm, normal heart sounds and normal pulses.   Pulmonary/Chest: Effort normal and breath sounds normal. She has no wheezes. She has no rhonchi. She has no rales.  Abdominal: There is no CVA tenderness.  Neurological:  She is alert.  Skin: Skin is warm and dry.  Psychiatric: She has a normal mood and affect. Her speech is normal and behavior is normal. Thought content normal.  Vitals reviewed.      Assessment & Plan:   Problem List Items Addressed This Visit      Other   Urinary frequency    Urinalysis is positive for leukocytes and blood. Will treat empirically for urinary tract infection. Pending culture. Advised patient to check  her INR in the next 2 and 3 days after she checked it today to ensure the antibiotic is not affecting the therapeutic level.       Other Visit Diagnoses    Dysuria    -  Primary   Relevant Medications   cefdinir (OMNICEF) 300 MG capsule   Other Relevant Orders   POCT urinalysis dipstick (Completed)   Urine culture        I am having Kara [name] start on cefdinir. I am also having her maintain her cholecalciferol, Hypromellose, lidocaine, cyanocobalamin, hydrocortisone-pramoxine, triamcinolone cream, metoprolol, diphenoxylate-atropine, estradiol, warfarin, diltiazem, HYDROcodone-acetaminophen, and ondansetron.   Meds ordered this encounter  Medications  . cefdinir (OMNICEF) 300 MG capsule    Sig: Take 1 capsule (300 mg total) by mouth 2 (two) times daily.    Dispense:  14 capsule    Refill:  0    Order Specific Question:   Supervising Provider    Answer:   Crecencio Mc [[id number]]    Return precautions given.   Risks, benefits, and alternatives of the medications and treatment plan prescribed today were discussed, and patient expressed understanding.   Education regarding symptom management and diagnosis given to patient on AVS.  Continue to follow with Mable Paris, FNP for routine health maintenance.   [name] and I agreed with plan.   Mable Paris, FNP

## 2015-12-27 NOTE — Telephone Encounter (Signed)
Please call patient  INR goal 2-3.  She is very close.   I would like for her to recheck on Monday especially since I have started an antibiotic.  thanks

## 2015-12-27 NOTE — Patient Instructions (Addendum)
Pleasure meeting you.    Drink plenty of water and take antibiotic as prescribed.   We are pending the urine culture to know the organism causing the infection and our office will call you with results. If the particular organism requires a different antibiotic than the on prescribed, we will place an order for a new prescription at that time.   If you symptoms worsen or you have new symptoms, please contact our office, or return to clinic for re evaluation.  Urinary Tract Infection Urinary tract infections (UTIs) can develop anywhere along your urinary tract. Your urinary tract is your body's drainage system for removing wastes and extra water. Your urinary tract includes two kidneys, two ureters, a bladder, and a urethra. Your kidneys are a pair of bean-shaped organs. Each kidney is about the size of your fist. They are located below your ribs, one on each side of your spine. CAUSES Infections are caused by microbes, which are microscopic organisms, including fungi, viruses, and bacteria. These organisms are so small that they can only be seen through a microscope. Bacteria are the microbes that most commonly cause UTIs. SYMPTOMS  Symptoms of UTIs may vary by age and gender of the patient and by the location of the infection. Symptoms in young women typically include a frequent and intense urge to urinate and a painful, burning feeling in the bladder or urethra during urination. Older women and men are more likely to be tired, shaky, and weak and have muscle aches and abdominal pain. A fever may mean the infection is in your kidneys. Other symptoms of a kidney infection include pain in your back or sides below the ribs, nausea, and vomiting. DIAGNOSIS To diagnose a UTI, your caregiver will ask you about your symptoms. Your caregiver will also ask you to provide a urine sample. The urine sample will be tested for bacteria and white blood cells. White blood cells are made by your body to help fight  infection. TREATMENT  Typically, UTIs can be treated with medication. Because most UTIs are caused by a bacterial infection, they usually can be treated with the use of antibiotics. The choice of antibiotic and length of treatment depend on your symptoms and the type of bacteria causing your infection. HOME CARE INSTRUCTIONS  If you were prescribed antibiotics, take them exactly as your caregiver instructs you. Finish the medication even if you feel better after you have only taken some of the medication.  Drink enough water and fluids to keep your urine clear or pale yellow.  Avoid caffeine, tea, and carbonated beverages. They tend to irritate your bladder.  Empty your bladder often. Avoid holding urine for long periods of time.  Empty your bladder before and after sexual intercourse.  After a bowel movement, women should cleanse from front to back. Use each tissue only once. SEEK MEDICAL CARE IF:   You have back pain.  You develop a fever.  Your symptoms do not begin to resolve within 3 days. SEEK IMMEDIATE MEDICAL CARE IF:   You have severe back pain or lower abdominal pain.  You develop chills.  You have nausea or vomiting.  You have continued burning or discomfort with urination. MAKE SURE YOU:   Understand these instructions.  Will watch your condition.  Will get help right away if you are not doing well or get worse.   This information is not intended to replace advice given to you by your health care provider. Make sure you discuss any questions you  have with your health care provider.   Document Released: 02/12/2005 Document Revised: 01/24/2015 Document Reviewed: 06/13/2011 Elsevier Interactive Patient Education [employer].

## 2015-12-27 NOTE — Telephone Encounter (Signed)
IBMINR reported a out of range INR of 1.9 from 08/10

## 2015-12-27 NOTE — Progress Notes (Signed)
Pre visit review using our clinic review tool, if applicable. No additional management support is needed unless otherwise documented below in the visit note. 

## 2015-12-28 NOTE — Telephone Encounter (Signed)
Patient stated she does them every week on Thursday.  Patient will send another once completed on Monday.

## 2015-12-29 LAB — URINE CULTURE

## 2015-12-31 LAB — POCT INR: INR: 2 — AB (ref <=1.1)

## 2016-01-02 MED ORDER — FLUCONAZOLE 150 MG PO TABS: 150.0000 mg | ORAL_TABLET | Freq: Once | ORAL | 1 refills | Status: AC

## 2016-01-02 NOTE — Telephone Encounter (Signed)
Please call patient and ask her what her INR is. I have not heard from her and concerned antibiotic may have  Affected her INR.

## 2016-01-02 NOTE — Telephone Encounter (Signed)
Done.   INR therapeutic.

## 2016-01-02 NOTE — Telephone Encounter (Signed)
Patient stated her INR was 2.0.  Patient also wanted to know if she could receive medication for a yeast infection due to every time she gets a UTI a yeast infection follows. Please advise.

## 2016-01-04 ENCOUNTER — Ambulatory Visit: Payer: Medicare Other | Admitting: Occupational Therapy

## 2016-01-04 DIAGNOSIS — M6281 Muscle weakness (generalized): Secondary | ICD-10-CM | POA: Diagnosis not present

## 2016-01-04 DIAGNOSIS — R208 Other disturbances of skin sensation: Secondary | ICD-10-CM

## 2016-01-04 DIAGNOSIS — M25641 Stiffness of right hand, not elsewhere classified: Secondary | ICD-10-CM

## 2016-01-07 ENCOUNTER — Ambulatory Visit: Payer: Medicare Other | Admitting: Occupational Therapy

## 2016-01-09 ENCOUNTER — Ambulatory Visit: Payer: Medicare Other | Admitting: Occupational Therapy

## 2016-01-09 ENCOUNTER — Ambulatory Visit: Payer: Medicare Other | Admitting: Family

## 2016-01-09 DIAGNOSIS — M6281 Muscle weakness (generalized): Secondary | ICD-10-CM

## 2016-01-09 DIAGNOSIS — R208 Other disturbances of skin sensation: Secondary | ICD-10-CM

## 2016-01-09 DIAGNOSIS — M25641 Stiffness of right hand, not elsewhere classified: Secondary | ICD-10-CM

## 2016-01-09 NOTE — Therapy (Signed)
[facility] [address], [place], [address] Phone: [phone]   Fax:  [phone]  Occupational Therapy Treatment  Patient Details  Name: Kara [name] MRN: [id number] Date of Birth: 07-05-1922 Referring Provider: Mack Guise  Encounter Date: 01/04/2016      OT End of Session - 01/09/16 1448    Visit Number 3   Number of Visits 6   Date for OT Re-Evaluation 02/06/16   OT Start Time [id number]   OT Stop Time 1247   OT Time Calculation (min) 25 min   Activity Tolerance Patient tolerated treatment well   Behavior During Therapy [facility] for tasks assessed/performed      Past Medical History:  Diagnosis Date  . Allergy   . Atrial fibrillation ([place])   . CAD (coronary artery disease)   . Cancer (HCC)    UTERINE  . Chronic anticoagulation   . Chronic diarrhea   . Decubitus ulcer    sacral region  . Diabetes ([place])    diet controlled  . Dysuria   . Edema of lower extremity    mainly right foot, slightly in left foot.  . Fibrocystic breast disease   . GERD (gastroesophageal reflux disease)   . Glaucoma   . Glaucoma   . Hematuria   . Hemorrhoids   . History of colon polyps   . History of pancreatitis   . Hyperlipidemia   . Hypertension   . Hypokalemia   . IBS (irritable bowel syndrome)   . Microscopic hematuria   . Mitral valve regurgitation   . Osteoarthritis    fingers  . Pernicious anemia   . Plantar fasciitis   . Recurrent UTI   . Skin cancer   . Sleep apnea, obstructive   . Vaginal atrophy   . Vitamin D deficiency   . Yeast vaginitis     Past Surgical History:  Procedure Laterality Date  . ABDOMINAL HYSTERECTOMY  1980's  . ABDOMINAL SURGERY     for villous polyp,,,many years ago  . APPENDECTOMY  1940's  . ASCAD, s/p PTCA  11/28/2005   MID LESION   . BREAST BIOPSY Left 1970's  . CARPAL TUNNEL RELEASE Right 12/13/2015   Procedure: CARPAL TUNNEL RELEASE;  Surgeon: Thornton Park, MD;   Location: [facility] ORS;  Service: Orthopedics;  Laterality: Right;  . COLECTOMY    . PACEMAKER PLACEMENT    . radation     for uterine cance  . REFRACTIVE SURGERY     for bilateral glaumoma  . TONSILLECTOMY  1936    There were no vitals filed for this visit.      Subjective Assessment - 01/09/16 1222    Subjective  My hand hurting some - but using it more -  I did the exercises 3 x yesterday - and ice the day before - had some swelling    Patient Stated Goals I want my hand better - like it was before - not to drop objects , pick up small objects    Currently in Pain? Yes   Pain Score 1    Pain Location Hand   Pain Orientation Right   Pain Descriptors / Indicators Aching            [facility] OT Assessment - 01/09/16 0001      AROM   Right Wrist Extension 65 Degrees   Right Wrist Flexion 75 Degrees   Left Wrist Extension 65 Degrees   Left Wrist Flexion 75  Degrees     Right Hand AROM   R Thumb Opposition to Index --  Opposition to base of 5th   R Index  MCP 0-90 90 Degrees   R Index PIP 0-100 100 Degrees   R Long  MCP 0-90 90 Degrees   R Long PIP 0-100 100 Degrees   R Ring  MCP 0-90 90 Degrees   R Ring PIP 0-100 100 Degrees   R Little  MCP 0-90 90 Degrees   R Little PIP 0-100 100 Degrees                  OT Treatments/Exercises (OP) - 01/09/16 0001      RUE Fluidotherapy   Number Minutes Fluidotherapy 10 Minutes   RUE Fluidotherapy Location Hand;Wrist   Comments At [facility] to decrease pain and increase  ROM      Pt ed on scar massage in all direction  cica scar pad provided for night time  One stitch come out   sterri stips removed prior  Tendon glides done - with min A PA and RA of thumb - with less pain  Opposition to all digits done             OT Education - 01/09/16 1243    Education provided Yes   Education Details HEP   Person(s) Educated Patient   Methods Explanation;Demonstration;Tactile cues;Verbal cues;Handout   Comprehension  Verbal cues required;Returned demonstration;Verbalized understanding          OT Short Term Goals - 12/26/15 1426      OT SHORT TERM GOAL #1   Title Pt to be independent in HEP to increase ROM, functional use , and strength   Baseline very little knowledge of HEP    Time 3   Period Weeks   Status New     OT SHORT TERM GOAL #2   Title AROM in digits and wrist improve to WNL to write, bathing and dressing with no issues   Baseline See flowsheet for wrist and digits - impaired    Time 3   Period Weeks   Status New           OT Long Term Goals - 12/26/15 1433      OT LONG TERM GOAL #1   Title R Grip strength improve to at least 50% compare to L to turn doorknob , carry 5 lbs    Baseline NT - stitches still in    Time 4   Period Weeks   Status New     OT LONG TERM GOAL #2   Title Function on PRWHE improve with at least 20 points    Baseline Function at Eval 28.5/50    Time 6   Period Weeks   Status New     OT LONG TERM GOAL #3   Title Pt report improvement in sensory issues - to tell textures, write, do buttons and play cards    Baseline Pt report pins and needles and numb feeling thumb thru 4th    Time 6   Period Weeks   Status New               Plan - 01/09/16 1449    Clinical Impression Statement Pt scar healing very well but adhere and causing some pain with thumb AROM , opposition , and tendon glides- tight scar - reviewed again with pt scar care and and HEP    Rehab Potential Good   OT Frequency 2x /  week   OT Duration 4 weeks   OT Treatment/Interventions Self-care/ADL training;Cryotherapy;Ultrasound;Fluidtherapy;Patient/family education;Therapeutic exercises;Contrast Bath;Scar mobilization;Passive range of motion;Manual Therapy   Plan assess progress and  how doing with HEP    OT Home Exercise Plan see pt instruction    Consulted and Agree with Plan of Care Patient      Patient will benefit from skilled therapeutic intervention in order to  improve the following deficits and impairments:  Decreased coordination, Decreased range of motion, Impaired flexibility, Impaired sensation, Decreased knowledge of precautions, Pain, Impaired UE functional use, Decreased scar mobility, Decreased strength  Visit Diagnosis: Stiffness of right hand, not elsewhere classified  Muscle weakness (generalized)  Other disturbances of skin sensation    Problem List Patient Active Problem List   Diagnosis Date Noted  . Urinary frequency 12/27/2015  . Atrophic vaginitis 09/26/2015  . Umbilical hernia without obstruction and without gangrene 06/26/2015  . Elevated blood sugar 05/29/2015  . Carpal tunnel syndrome of right wrist 05/29/2015  . Chronic fatigue 05/01/2015  . Left leg pain 05/01/2015  . Closed fracture of part of upper end of humerus 01/29/2015  . Skin lesion 01/29/2015  . Recurrent UTI 11/21/2014  . Vaginal atrophy 11/21/2014  . Urethral caruncle 11/21/2014  . Mitral valve regurgitation 05/09/2014  . History of pancreatitis 03/09/2014  . Medicare annual wellness visit, subsequent 10/06/2012  . Hemorrhoids 06/08/2012  . Osteoarthritis 03/08/2012  . Screening for breast cancer 03/08/2012  . Pernicious anemia 02/04/2012  . History of colectomy 02/04/2012  . Vitamin D deficiency 07/30/2011  . Chronic anticoagulation 06/13/2011  . Atrial fibrillation ([place]) 06/13/2011  . CAD (coronary artery disease) 06/13/2011  . Chronic diarrhea 06/13/2011    Rosalyn Gess OTR/L,CLT  01/04/2016, 3:00 PM  [facility] [address], [place], [address] Phone: [phone]   Fax:  [phone]  Name: BRACIE KNEIP MRN: [id number] Date of Birth: 09/29/1922

## 2016-01-09 NOTE — Patient Instructions (Signed)
Contrast  Tendon glides   Med N glide Opposition to all digits  PA and RA of thumb   Scar massage ed on - and Cica care scar pad provided to use at night time

## 2016-01-09 NOTE — Therapy (Signed)
[facility] [address] [place], [address] Phone: [phone]   Fax:  [phone]  Occupational Therapy Treatment  Patient Details  Name: Kara [name] MRN: [id number] Date of Birth: 1923-05-17 Referring Provider: Mack Guise  Encounter Date: 01/09/2016      OT End of Session - 01/09/16 1448    Visit Number 3   Number of Visits 6   Date for OT Re-Evaluation 02/06/16   OT Start Time [id number]   OT Stop Time 1247   OT Time Calculation (min) 25 min   Activity Tolerance Patient tolerated treatment well   Behavior During Therapy [facility] for tasks assessed/performed      Past Medical History:  Diagnosis Date  . Allergy   . Atrial fibrillation ([place])   . CAD (coronary artery disease)   . Cancer (HCC)    UTERINE  . Chronic anticoagulation   . Chronic diarrhea   . Decubitus ulcer    sacral region  . Diabetes ([place])    diet controlled  . Dysuria   . Edema of lower extremity    mainly right foot, slightly in left foot.  . Fibrocystic breast disease   . GERD (gastroesophageal reflux disease)   . Glaucoma   . Glaucoma   . Hematuria   . Hemorrhoids   . History of colon polyps   . History of pancreatitis   . Hyperlipidemia   . Hypertension   . Hypokalemia   . IBS (irritable bowel syndrome)   . Microscopic hematuria   . Mitral valve regurgitation   . Osteoarthritis    fingers  . Pernicious anemia   . Plantar fasciitis   . Recurrent UTI   . Skin cancer   . Sleep apnea, obstructive   . Vaginal atrophy   . Vitamin D deficiency   . Yeast vaginitis     Past Surgical History:  Procedure Laterality Date  . ABDOMINAL HYSTERECTOMY  1980's  . ABDOMINAL SURGERY     for villous polyp,,,many years ago  . APPENDECTOMY  1940's  . ASCAD, s/p PTCA  11/28/2005   MID LESION   . BREAST BIOPSY Left 1970's  . CARPAL TUNNEL RELEASE Right 12/13/2015   Procedure: CARPAL TUNNEL RELEASE;  Surgeon: Thornton Park, MD;   Location: [facility] ORS;  Service: Orthopedics;  Laterality: Right;  . COLECTOMY    . PACEMAKER PLACEMENT    . radation     for uterine cance  . REFRACTIVE SURGERY     for bilateral glaumoma  . TONSILLECTOMY  1936    There were no vitals filed for this visit.      Subjective Assessment - 01/09/16 1222    Subjective  My hand hurting some - but using it more -  I did the exercises 3 x yesterday - and ice the day before - had some swelling    Patient Stated Goals I want my hand better - like it was before - not to drop objects , pick up small objects    Currently in Pain? Yes   Pain Score 1    Pain Location Hand   Pain Orientation Right   Pain Descriptors / Indicators Aching            [facility] OT Assessment - 01/09/16 0001      AROM   Right Wrist Extension 65 Degrees   Right Wrist Flexion 75 Degrees   Left Wrist Extension 65 Degrees   Left Wrist Flexion 75  Degrees     Right Hand AROM   R Thumb Opposition to Index --  Opposition to base of 5th   R Index  MCP 0-90 90 Degrees   R Index PIP 0-100 100 Degrees   R Long  MCP 0-90 90 Degrees   R Long PIP 0-100 100 Degrees   R Ring  MCP 0-90 90 Degrees   R Ring PIP 0-100 100 Degrees   R Little  MCP 0-90 90 Degrees   R Little PIP 0-100 100 Degrees                  OT Treatments/Exercises (OP) - 01/09/16 0001      RUE Fluidotherapy   Number Minutes Fluidotherapy 10 Minutes   RUE Fluidotherapy Location Hand;Wrist   Comments At [facility] to decrease pain and increase  ROM       After fluido  Did Graston tools for soft tissue mobs and scar mobs over CT , and scar - brushing , sweeping and scooping with tool nr 2 and 4 - to decrease scar tissue, increase ROM and decrease pain  CT spreads done 10 x   Pt ed again on scar massage - pt report not doing correctly - as well as reviewed wearing of scar pad  Tendon glides done  Thumb PA and RA - pt needed min A for position Opposition to all digits   Med N glide - needed mod A  for last 3 steps              OT Education - 01/09/16 1243    Education provided Yes   Education Details HEP   Person(s) Educated Patient   Methods Explanation;Demonstration;Tactile cues;Verbal cues;Handout   Comprehension Verbal cues required;Returned demonstration;Verbalized understanding          OT Short Term Goals - 12/26/15 1426      OT SHORT TERM GOAL #1   Title Pt to be independent in HEP to increase ROM, functional use , and strength   Baseline very little knowledge of HEP    Time 3   Period Weeks   Status New     OT SHORT TERM GOAL #2   Title AROM in digits and wrist improve to WNL to write, bathing and dressing with no issues   Baseline See flowsheet for wrist and digits - impaired    Time 3   Period Weeks   Status New           OT Long Term Goals - 12/26/15 1433      OT LONG TERM GOAL #1   Title R Grip strength improve to at least 50% compare to L to turn doorknob , carry 5 lbs    Baseline NT - stitches still in    Time 4   Period Weeks   Status New     OT LONG TERM GOAL #2   Title Function on PRWHE improve with at least 20 points    Baseline Function at Eval 28.5/50    Time 6   Period Weeks   Status New     OT LONG TERM GOAL #3   Title Pt report improvement in sensory issues - to tell textures, write, do buttons and play cards    Baseline Pt report pins and needles and numb feeling thumb thru 4th    Time 6   Period Weeks   Status New  Plan - 01/09/16 1449    Clinical Impression Statement Pt scar healing very well but adhere and causing some pain with thumb AROM , opposition , and tendon glides- tight scar - reviewed again with pt scar care and and HEP    Rehab Potential Good   OT Frequency 2x / week   OT Duration 4 weeks   OT Treatment/Interventions Self-care/ADL training;Cryotherapy;Ultrasound;Fluidtherapy;Patient/family education;Therapeutic exercises;Contrast Bath;Scar mobilization;Passive range of  motion;Manual Therapy   Plan assess progress and  how doing with HEP    OT Home Exercise Plan see pt instruction    Consulted and Agree with Plan of Care Patient      Patient will benefit from skilled therapeutic intervention in order to improve the following deficits and impairments:  Decreased coordination, Decreased range of motion, Impaired flexibility, Impaired sensation, Decreased knowledge of precautions, Pain, Impaired UE functional use, Decreased scar mobility, Decreased strength  Visit Diagnosis: Stiffness of right hand, not elsewhere classified  Muscle weakness (generalized)  Other disturbances of skin sensation    Problem List Patient Active Problem List   Diagnosis Date Noted  . Urinary frequency 12/27/2015  . Atrophic vaginitis 09/26/2015  . Umbilical hernia without obstruction and without gangrene 06/26/2015  . Elevated blood sugar 05/29/2015  . Carpal tunnel syndrome of right wrist 05/29/2015  . Chronic fatigue 05/01/2015  . Left leg pain 05/01/2015  . Closed fracture of part of upper end of humerus 01/29/2015  . Skin lesion 01/29/2015  . Recurrent UTI 11/21/2014  . Vaginal atrophy 11/21/2014  . Urethral caruncle 11/21/2014  . Mitral valve regurgitation 05/09/2014  . History of pancreatitis 03/09/2014  . Medicare annual wellness visit, subsequent 10/06/2012  . Hemorrhoids 06/08/2012  . Osteoarthritis 03/08/2012  . Screening for breast cancer 03/08/2012  . Pernicious anemia 02/04/2012  . History of colectomy 02/04/2012  . Vitamin D deficiency 07/30/2011  . Chronic anticoagulation 06/13/2011  . Atrial fibrillation ([place]) 06/13/2011  . CAD (coronary artery disease) 06/13/2011  . Chronic diarrhea 06/13/2011    Rosalyn Gess OTR/L,CLT  01/09/2016, 2:51 PM  [facility] [address], [place], [address] Phone: [phone]   Fax:  [phone]  Name: YESSENIA [place] MRN:  [id number] Date of Birth: [birth year]

## 2016-01-09 NOTE — Patient Instructions (Signed)
Pt to do same HEP  For scar , ROM

## 2016-01-11 ENCOUNTER — Ambulatory Visit: Payer: Medicare Other | Admitting: Occupational Therapy

## 2016-01-11 ENCOUNTER — Telehealth: Payer: Self-pay | Admitting: *Deleted

## 2016-01-11 DIAGNOSIS — M6281 Muscle weakness (generalized): Secondary | ICD-10-CM

## 2016-01-11 DIAGNOSIS — M25641 Stiffness of right hand, not elsewhere classified: Secondary | ICD-10-CM

## 2016-01-11 DIAGNOSIS — R208 Other disturbances of skin sensation: Secondary | ICD-10-CM

## 2016-01-11 NOTE — Telephone Encounter (Signed)
Left message for patient to return call back.  Patient needs to be informed that she needs to go to Urgent care due to M[place] says for her to go.  We have no available appointments today.

## 2016-01-11 NOTE — Therapy (Signed)
[facility] [address] [place], [address] Phone: [phone]   Fax:  [phone]  Occupational Therapy Treatment  Patient Details  Name: Kara [name] MRN: [id number] Date of Birth: [birth year] Referring Provider: Mack Guise  Encounter Date: 01/11/2016      OT End of Session - 01/11/16 1047    Visit Number 4   Number of Visits 6   Date for OT Re-Evaluation 02/06/16   OT Start Time 1042   OT Stop Time 1130   OT Time Calculation (min) 48 min   Activity Tolerance Patient tolerated treatment well   Behavior During Therapy [facility] for tasks assessed/performed      Past Medical History:  Diagnosis Date  . Allergy   . Atrial fibrillation ([place])   . CAD (coronary artery disease)   . Cancer (HCC)    UTERINE  . Chronic anticoagulation   . Chronic diarrhea   . Decubitus ulcer    sacral region  . Diabetes ([place])    diet controlled  . Dysuria   . Edema of lower extremity    mainly right foot, slightly in left foot.  . Fibrocystic breast disease   . GERD (gastroesophageal reflux disease)   . Glaucoma   . Glaucoma   . Hematuria   . Hemorrhoids   . History of colon polyps   . History of pancreatitis   . Hyperlipidemia   . Hypertension   . Hypokalemia   . IBS (irritable bowel syndrome)   . Microscopic hematuria   . Mitral valve regurgitation   . Osteoarthritis    fingers  . Pernicious anemia   . Plantar fasciitis   . Recurrent UTI   . Skin cancer   . Sleep apnea, obstructive   . Vaginal atrophy   . Vitamin D deficiency   . Yeast vaginitis     Past Surgical History:  Procedure Laterality Date  . ABDOMINAL HYSTERECTOMY  1980's  . ABDOMINAL SURGERY     for villous polyp,,,many years ago  . APPENDECTOMY  1940's  . ASCAD, s/p PTCA  11/28/2005   MID LESION   . BREAST BIOPSY Left 1970's  . CARPAL TUNNEL RELEASE Right 12/13/2015   Procedure: CARPAL TUNNEL RELEASE;  Surgeon: Thornton Park, MD;   Location: [facility] ORS;  Service: Orthopedics;  Laterality: Right;  . COLECTOMY    . PACEMAKER PLACEMENT    . radation     for uterine cance  . REFRACTIVE SURGERY     for bilateral glaumoma  . TONSILLECTOMY  1936    There were no vitals filed for this visit.      Subjective Assessment - 01/11/16 1044    Subjective  Doing good since last time - less pain at the scar - like last time - other wise about the same    Patient Stated Goals I want my hand better - like it was before - not to drop objects , pick up small objects    Currently in Pain? Yes   Pain Score 1    Pain Location Hand   Pain Orientation Right   Pain Descriptors / Indicators Aching            [facility] OT Assessment - 01/11/16 0001      Strength   Right Hand Grip (lbs) 10   Right Hand Lateral Pinch 10 lbs   Right Hand 3 Point Pinch 4 lbs   Left Hand Grip (lbs) 15   Left  Hand Lateral Pinch 10 lbs   Left Hand 3 Point Pinch 9 lbs                  OT Treatments/Exercises (OP) - 01/11/16 0001      RUE Paraffin   Number Minutes Paraffin 10 Minutes   RUE Paraffin Location Hand   Comments Decrease scar tissue and pain - at [facility]       Paraffin to R hand at [facility]  Grip strength and prehension assess - see flowsheet   Did Graston tools for soft tissue mobs and scar mobs over CT , and scar - brushing , sweeping and scooping with tool nr 2 and 4 - to decrease scar tissue, increase ROM and decrease pain  CT spreads done 10 x   Pt ed again on scar massage - pt report not doing correctly - as well as reviewed wearing of scar pad  Tendon glides done  Thumb PA and RA - pt needed min A for position Opposition to all digits  Wrist extention stretch - prayer stretch  - needed min A  Med N glide - needed mod A for last step- 5 reps Light blue putty add for HEP grip and 3 point grip - 15 reps  No increase pain    [place] at 20% at 3.3MHZ, 1.0 intensity,  for 5 min over CT at the end of session               OT Education - 01/11/16 1047    Education provided Yes   Education Details HEP update    Person(s) Educated Patient   Methods Explanation;Demonstration;Tactile cues;Verbal cues   Comprehension Verbalized understanding;Returned demonstration          OT Short Term Goals - 12/26/15 1426      OT SHORT TERM GOAL #1   Title Pt to be independent in HEP to increase ROM, functional use , and strength   Baseline very little knowledge of HEP    Time 3   Period Weeks   Status New     OT SHORT TERM GOAL #2   Title AROM in digits and wrist improve to WNL to write, bathing and dressing with no issues   Baseline See flowsheet for wrist and digits - impaired    Time 3   Period Weeks   Status New           OT Long Term Goals - 12/26/15 1433      OT LONG TERM GOAL #1   Title R Grip strength improve to at least 50% compare to L to turn doorknob , carry 5 lbs    Baseline NT - stitches still in    Time 4   Period Weeks   Status New     OT LONG TERM GOAL #2   Title Function on PRWHE improve with at least 20 points    Baseline Function at Eval 28.5/50    Time 6   Period Weeks   Status New     OT LONG TERM GOAL #3   Title Pt report improvement in sensory issues - to tell textures, write, do buttons and play cards    Baseline Pt report pins and needles and numb feeling thumb thru 4th    Time 6   Period Weeks   Status New               Plan - 01/11/16 1048    Clinical Impression Statement Pt making  progress in scar healing - pain and functional use - still decrease grip - report she drops objects - but L also weak since shoulder fx in past - add putty this date    Rehab Potential Good   OT Frequency 2x / week   OT Duration 4 weeks   OT Treatment/Interventions Self-care/ADL training;Cryotherapy;Ultrasound;Fluidtherapy;Patient/family education;Therapeutic exercises;Contrast Bath;Scar mobilization;Passive range of motion;Manual Therapy   Plan assess how HEP doing - numbness  progress   OT Home Exercise Plan see pt instruction    Consulted and Agree with Plan of Care Patient      Patient will benefit from skilled therapeutic intervention in order to improve the following deficits and impairments:  Decreased coordination, Decreased range of motion, Impaired flexibility, Impaired sensation, Decreased knowledge of precautions, Pain, Impaired UE functional use, Decreased scar mobility, Decreased strength  Visit Diagnosis: Stiffness of right hand, not elsewhere classified  Muscle weakness (generalized)  Other disturbances of skin sensation    Problem List Patient Active Problem List   Diagnosis Date Noted  . Urinary frequency 12/27/2015  . Atrophic vaginitis 09/26/2015  . Umbilical hernia without obstruction and without gangrene 06/26/2015  . Elevated blood sugar 05/29/2015  . Carpal tunnel syndrome of right wrist 05/29/2015  . Chronic fatigue 05/01/2015  . Left leg pain 05/01/2015  . Closed fracture of part of upper end of humerus 01/29/2015  . Skin lesion 01/29/2015  . Recurrent UTI 11/21/2014  . Vaginal atrophy 11/21/2014  . Urethral caruncle 11/21/2014  . Mitral valve regurgitation 05/09/2014  . History of pancreatitis 03/09/2014  . Medicare annual wellness visit, subsequent 10/06/2012  . Hemorrhoids 06/08/2012  . Osteoarthritis 03/08/2012  . Screening for breast cancer 03/08/2012  . Pernicious anemia 02/04/2012  . History of colectomy 02/04/2012  . Vitamin D deficiency 07/30/2011  . Chronic anticoagulation 06/13/2011  . Atrial fibrillation ([place]) 06/13/2011  . CAD (coronary artery disease) 06/13/2011  . Chronic diarrhea 06/13/2011    Rosalyn Gess OTR/L,CLT  01/11/2016, 11:32 AM  [facility] [address], [place], [address] Phone: [phone]   Fax:  [phone]  Name: Kara [name] MRN: [id number] Date of Birth: 05/23/1922

## 2016-01-11 NOTE — Telephone Encounter (Signed)
Please call patient-   Her last urine culture 8/10 actually did not indicate UTI.   I would advise her to be seen at an Urgent care based on her age.  I have concerns being a Friday and certainly do not want this worsening over weekend.

## 2016-01-11 NOTE — Patient Instructions (Addendum)
Same HEP - but add light blue putty for grip and  3 point grip  15 reps 2 x day

## 2016-01-11 NOTE — Telephone Encounter (Signed)
SX are returning of UTI while she is taking medication for yeast infection. Please advise.

## 2016-01-11 NOTE — Telephone Encounter (Signed)
Patient has requested a medication to help with her vaginal burning and itching in her folds. She did not want another Rx for estradiol. Pt contact [phone]

## 2016-01-14 ENCOUNTER — Encounter: Payer: Self-pay | Admitting: Internal Medicine

## 2016-01-14 ENCOUNTER — Ambulatory Visit: Payer: Medicare Other | Admitting: Occupational Therapy

## 2016-01-14 DIAGNOSIS — I4891 Unspecified atrial fibrillation: Secondary | ICD-10-CM | POA: Diagnosis not present

## 2016-01-14 DIAGNOSIS — M25641 Stiffness of right hand, not elsewhere classified: Secondary | ICD-10-CM

## 2016-01-14 DIAGNOSIS — M6281 Muscle weakness (generalized): Secondary | ICD-10-CM | POA: Diagnosis not present

## 2016-01-14 DIAGNOSIS — R208 Other disturbances of skin sensation: Secondary | ICD-10-CM | POA: Diagnosis not present

## 2016-01-14 LAB — PROTIME-INR: INR: 3.3 — AB (ref 0.9–1.1)

## 2016-01-14 NOTE — Patient Instructions (Addendum)
Cont  scar massage where to focus Tendon glides done - verbal cues and min A to stabilize wrist to not flex Thumb PA and RA -  Opposition to all digits  Wrist AROM extention WNL compare to L   Med N glide - needed min A - 5 reps Light blue putty  HEP grip and 3 point grip - 15 reps  No increase pain - needed min A   Not to over do putty

## 2016-01-14 NOTE — Therapy (Signed)
[facility] [address], [place], [address] Phone: [phone]   Fax:  [phone]  Occupational Therapy Treatment  Patient Details  Name: Kara [name] MRN: [id number] Date of Birth: 02-18-1923 Referring Provider: Mack Guise  Encounter Date: 01/14/2016      OT End of Session - 01/14/16 1237    Visit Number 5   Number of Visits 6   Date for OT Re-Evaluation 02/06/16   OT Start Time 1045   OT Stop Time 1129   OT Time Calculation (min) 44 min   Activity Tolerance Patient tolerated treatment well   Behavior During Therapy [facility] for tasks assessed/performed      Past Medical History:  Diagnosis Date  . Allergy   . Atrial fibrillation ([place])   . CAD (coronary artery disease)   . Cancer (HCC)    UTERINE  . Chronic anticoagulation   . Chronic diarrhea   . Decubitus ulcer    sacral region  . Diabetes ([place])    diet controlled  . Dysuria   . Edema of lower extremity    mainly right foot, slightly in left foot.  . Fibrocystic breast disease   . GERD (gastroesophageal reflux disease)   . Glaucoma   . Glaucoma   . Hematuria   . Hemorrhoids   . History of colon polyps   . History of pancreatitis   . Hyperlipidemia   . Hypertension   . Hypokalemia   . IBS (irritable bowel syndrome)   . Microscopic hematuria   . Mitral valve regurgitation   . Osteoarthritis    fingers  . Pernicious anemia   . Plantar fasciitis   . Recurrent UTI   . Skin cancer   . Sleep apnea, obstructive   . Vaginal atrophy   . Vitamin D deficiency   . Yeast vaginitis     Past Surgical History:  Procedure Laterality Date  . ABDOMINAL HYSTERECTOMY  1980's  . ABDOMINAL SURGERY     for villous polyp,,,many years ago  . APPENDECTOMY  1940's  . ASCAD, s/p PTCA  11/28/2005   MID LESION   . BREAST BIOPSY Left 1970's  . CARPAL TUNNEL RELEASE Right 12/13/2015   Procedure: CARPAL TUNNEL RELEASE;  Surgeon: Thornton Park, MD;   Location: [facility] ORS;  Service: Orthopedics;  Laterality: Right;  . COLECTOMY    . PACEMAKER PLACEMENT    . radation     for uterine cance  . REFRACTIVE SURGERY     for bilateral glaumoma  . TONSILLECTOMY  1936    There were no vitals filed for this visit.      Subjective Assessment - 01/14/16 1048    Subjective  Had some burning pain since yesterday AM - the only thing we added was putty - and I did that Sat  - still hard time picking my cards up    Patient Stated Goals I want my hand better - like it was before - not to drop objects , pick up small objects    Currently in Pain? Yes   Pain Score 1    Pain Location Wrist   Pain Orientation Right   Pain Descriptors / Indicators Aching                      OT Treatments/Exercises (OP) - 01/14/16 0001      RUE Paraffin   Number Minutes Paraffin 10 Minutes   RUE Paraffin Location Hand  Comments decrease scar tissue and pain - prior to manual       Paraffin to R hand at [facility]  CUS  at 3.3MHZ, .8 intensity,  for 5 min over CT prior to manual therpy  Did Graston tools for soft tissue mobs and scar mobs over CT , and scar - brushing , sweeping and scooping with tool nr 2 and 4 - to decrease scar tissue, increase ROM and decrease pain  CT spreads done 10 x  xtractor at proximal scar - tender pin size scar adhesion - and tender   Pt ed again on scar massage where to focus Tendon glides done - verbal cues and min A to stabilize wrist to not flex Thumb PA and RA -  Opposition to all digits  Wrist AROM extention WNL compare to L   Med N glide - needed min A - 5 reps Light blue putty  HEP grip and 3 point grip - 15 reps  No increase pain - needed min A               OT Education - 01/14/16 1237    Education provided Yes   Education Details HEP update   Person(s) Educated Patient   Methods Explanation;Demonstration;Tactile cues;Verbal cues;Handout   Comprehension Verbal cues required;Returned  demonstration;Verbalized understanding          OT Short Term Goals - 12/26/15 1426      OT SHORT TERM GOAL #1   Title Pt to be independent in HEP to increase ROM, functional use , and strength   Baseline very little knowledge of HEP    Time 3   Period Weeks   Status New     OT SHORT TERM GOAL #2   Title AROM in digits and wrist improve to WNL to write, bathing and dressing with no issues   Baseline See flowsheet for wrist and digits - impaired    Time 3   Period Weeks   Status New           OT Long Term Goals - 12/26/15 1433      OT LONG TERM GOAL #1   Title R Grip strength improve to at least 50% compare to L to turn doorknob , carry 5 lbs    Baseline NT - stitches still in    Time 4   Period Weeks   Status New     OT LONG TERM GOAL #2   Title Function on PRWHE improve with at least 20 points    Baseline Function at Eval 28.5/50    Time 6   Period Weeks   Status New     OT LONG TERM GOAL #3   Title Pt report improvement in sensory issues - to tell textures, write, do buttons and play cards    Baseline Pt report pins and needles and numb feeling thumb thru 4th    Time 6   Period Weeks   Status New               Plan - 01/14/16 1238    Clinical Impression Statement Pt made progress in scar tissue, healing and pain - but still sensory issues at thumb thru 3rd - pt to not over do putty for gripping  and doing better with independency with HEP for  ROM and Med N glide    OT Frequency 2x / week   OT Duration 2 weeks   OT Treatment/Interventions Self-care/ADL training;Cryotherapy;Ultrasound;Fluidtherapy;Patient/family education;Therapeutic exercises;Contrast Bath;Scar mobilization;Passive range  of motion;Manual Therapy   Plan assess progress    OT Home Exercise Plan see pt instruction    Consulted and Agree with Plan of Care Patient      Patient will benefit from skilled therapeutic intervention in order to improve the following deficits and  impairments:  Decreased coordination, Decreased range of motion, Impaired flexibility, Impaired sensation, Decreased knowledge of precautions, Pain, Impaired UE functional use, Decreased scar mobility, Decreased strength  Visit Diagnosis: Stiffness of right hand, not elsewhere classified  Muscle weakness (generalized)    Problem List Patient Active Problem List   Diagnosis Date Noted  . Urinary frequency 12/27/2015  . Atrophic vaginitis 09/26/2015  . Umbilical hernia without obstruction and without gangrene 06/26/2015  . Elevated blood sugar 05/29/2015  . Carpal tunnel syndrome of right wrist 05/29/2015  . Chronic fatigue 05/01/2015  . Left leg pain 05/01/2015  . Closed fracture of part of upper end of humerus 01/29/2015  . Skin lesion 01/29/2015  . Recurrent UTI 11/21/2014  . Vaginal atrophy 11/21/2014  . Urethral caruncle 11/21/2014  . Mitral valve regurgitation 05/09/2014  . History of pancreatitis 03/09/2014  . Medicare annual wellness visit, subsequent 10/06/2012  . Hemorrhoids 06/08/2012  . Osteoarthritis 03/08/2012  . Screening for breast cancer 03/08/2012  . Pernicious anemia 02/04/2012  . History of colectomy 02/04/2012  . Vitamin D deficiency 07/30/2011  . Chronic anticoagulation 06/13/2011  . Atrial fibrillation ([place]) 06/13/2011  . CAD (coronary artery disease) 06/13/2011  . Chronic diarrhea 06/13/2011    Rosalyn Gess OTR/L,CLT 01/14/2016, 12:41 PM  [facility] [address] [place], [address] Phone: [phone]   Fax:  [phone]  Name: Kara [name] MRN: [id number] Date of Birth: 1922/09/03

## 2016-01-14 NOTE — Telephone Encounter (Signed)
Pt called back and the information was given to pt. Pt stated she is not going to a Urgent care unless it was a emergency. Please advise?  Thank you!

## 2016-01-15 ENCOUNTER — Telehealth: Payer: Self-pay | Admitting: Family

## 2016-01-15 NOTE — Telephone Encounter (Signed)
Please call patient Kara [name] INR is 3.3 which is above range. The goal is between 2 and 3. Please ask Kara [name] to recheck INR.   Please also ask Kara [name] if any dietary changes or medication changes which may have affected this level.

## 2016-01-15 NOTE — Telephone Encounter (Signed)
Patient has been informed.

## 2016-01-18 ENCOUNTER — Ambulatory Visit: Payer: Medicare Other | Attending: Orthopedic Surgery | Admitting: Occupational Therapy

## 2016-01-18 DIAGNOSIS — R208 Other disturbances of skin sensation: Secondary | ICD-10-CM

## 2016-01-18 DIAGNOSIS — M25641 Stiffness of right hand, not elsewhere classified: Secondary | ICD-10-CM | POA: Diagnosis not present

## 2016-01-18 DIAGNOSIS — M6281 Muscle weakness (generalized): Secondary | ICD-10-CM

## 2016-01-18 NOTE — Therapy (Signed)
[place] [facility] [address], [place], [address] Phone: [phone]   Fax:  [phone]  Occupational Therapy Treatment  Patient Details  Name: Kara [name] MRN: [id number] Date of Birth: 1923-05-06 Referring Provider: Mack Guise  Encounter Date: 01/18/2016      OT End of Session - 01/18/16 1426    Visit Number 6   Number of Visits 8   Date for OT Re-Evaluation 02/06/16   OT Start Time 1115   OT Stop Time 1155   OT Time Calculation (min) 40 min   Activity Tolerance Patient tolerated treatment well   Behavior During Therapy [facility] for tasks assessed/performed      Past Medical History:  Diagnosis Date  . Allergy   . Atrial fibrillation ([place])   . CAD (coronary artery disease)   . Cancer (HCC)    UTERINE  . Chronic anticoagulation   . Chronic diarrhea   . Decubitus ulcer    sacral region  . Diabetes ([place])    diet controlled  . Dysuria   . Edema of lower extremity    mainly right foot, slightly in left foot.  . Fibrocystic breast disease   . GERD (gastroesophageal reflux disease)   . Glaucoma   . Glaucoma   . Hematuria   . Hemorrhoids   . History of colon polyps   . History of pancreatitis   . Hyperlipidemia   . Hypertension   . Hypokalemia   . IBS (irritable bowel syndrome)   . Microscopic hematuria   . Mitral valve regurgitation   . Osteoarthritis    fingers  . Pernicious anemia   . Plantar fasciitis   . Recurrent UTI   . Skin cancer   . Sleep apnea, obstructive   . Vaginal atrophy   . Vitamin D deficiency   . Yeast vaginitis     Past Surgical History:  Procedure Laterality Date  . ABDOMINAL HYSTERECTOMY  1980's  . ABDOMINAL SURGERY     for villous polyp,,,many years ago  . APPENDECTOMY  1940's  . ASCAD, s/p PTCA  11/28/2005   MID LESION   . BREAST BIOPSY Left 1970's  . CARPAL TUNNEL RELEASE Right 12/13/2015   Procedure: CARPAL TUNNEL RELEASE;  Surgeon: Thornton Park, MD;   Location: [facility] ORS;  Service: Orthopedics;  Laterality: Right;  . COLECTOMY    . PACEMAKER PLACEMENT    . radation     for uterine cance  . REFRACTIVE SURGERY     for bilateral glaumoma  . TONSILLECTOMY  1936    There were no vitals filed for this visit.      Subjective Assessment - 01/18/16 1424    Subjective  Had some pain over carpal tunner scar at times- about 3-4/10 - I am constant rubbing that scar - still some numbness in thumb thru middle finger - and then making fist - I do have pain in fingers  mostly middle finger    Patient Stated Goals I want my hand better - like it was before - not to drop objects , pick up small objects    Currently in Pain? Yes   Pain Score 3    Pain Location Hand   Pain Orientation Right   Pain Descriptors / Indicators Aching   Pain Type Surgical pain                      OT Treatments/Exercises (OP) - 01/18/16 0001  RUE Paraffin   Number Minutes Paraffin 10 Minutes   RUE Paraffin Location Hand   Comments at [facility] to decrease pain and scar tissue     Paraffin to R hand at [facility]  CUS  at 3.3MHZ, .8 intensity, for 5 min over CT prior to manual therpy  Did Graston tools for soft tissue mobs and scar mobs over CT , and scar - brushing , sweeping and scooping with tool nr 2 and 4 - to decrease scar tissue, increase ROM and decrease pain  CT spreads done 10 x  -  proximal scar - tender pin size scar adhesion Reinforce for pt to only do scar massage 3 x day - not constant rub it   Tendon glides done - verbal cue  Med N glide - needed min v/c - 5 reps Not to force range - but easy light range - hold off on putty               OT Education - 01/18/16 1426    Education provided Yes   Education Details HEP changes   Person(s) Educated Patient   Methods Explanation;Demonstration;Tactile cues;Verbal cues   Comprehension Returned demonstration;Verbalized understanding;Verbal cues required          OT Short Term  Goals - 12/26/15 1426      OT SHORT TERM GOAL #1   Title Pt to be independent in HEP to increase ROM, functional use , and strength   Baseline very little knowledge of HEP    Time 3   Period Weeks   Status New     OT SHORT TERM GOAL #2   Title AROM in digits and wrist improve to WNL to write, bathing and dressing with no issues   Baseline See flowsheet for wrist and digits - impaired    Time 3   Period Weeks   Status New           OT Long Term Goals - 12/26/15 1433      OT LONG TERM GOAL #1   Title R Grip strength improve to at least 50% compare to L to turn doorknob , carry 5 lbs    Baseline NT - stitches still in    Time 4   Period Weeks   Status New     OT LONG TERM GOAL #2   Title Function on PRWHE improve with at least 20 points    Baseline Function at Eval 28.5/50    Time 6   Period Weeks   Status New     OT LONG TERM GOAL #3   Title Pt report improvement in sensory issues - to tell textures, write, do buttons and play cards    Baseline Pt report pins and needles and numb feeling thumb thru 4th    Time 6   Period Weeks   Status New               Plan - 01/18/16 1427    Clinical Impression Statement Pt cont to have sensory issues at thumb thru 3rd - report some pain at times over palm and scar - appear pt constant rubbing scar - and then putty  put on hold this date - pt to only do scar massage 3 x day - scar pad at night time - and only  tendon glides , Med N glide  to do at home - ice as needed    Rehab Potential Good   OT Frequency 2x / week  OT Duration 2 weeks   OT Treatment/Interventions Self-care/ADL training;Cryotherapy;Ultrasound;Fluidtherapy;Patient/family education;Therapeutic exercises;Contrast Bath;Scar mobilization;Passive range of motion;Manual Therapy   Plan assess if pain in palm and scar better    OT Home Exercise Plan see pt instruction    Consulted and Agree with Plan of Care Patient      Patient will benefit from skilled  therapeutic intervention in order to improve the following deficits and impairments:  Decreased coordination, Decreased range of motion, Impaired flexibility, Impaired sensation, Decreased knowledge of precautions, Pain, Impaired UE functional use, Decreased scar mobility, Decreased strength  Visit Diagnosis: Stiffness of right hand, not elsewhere classified  Muscle weakness (generalized)  Other disturbances of skin sensation    Problem List Patient Active Problem List   Diagnosis Date Noted  . Urinary frequency 12/27/2015  . Atrophic vaginitis 09/26/2015  . Umbilical hernia without obstruction and without gangrene 06/26/2015  . Elevated blood sugar 05/29/2015  . Carpal tunnel syndrome of right wrist 05/29/2015  . Chronic fatigue 05/01/2015  . Left leg pain 05/01/2015  . Closed fracture of part of upper end of humerus 01/29/2015  . Skin lesion 01/29/2015  . Recurrent UTI 11/21/2014  . Vaginal atrophy 11/21/2014  . Urethral caruncle 11/21/2014  . Mitral valve regurgitation 05/09/2014  . History of pancreatitis 03/09/2014  . Medicare annual wellness visit, subsequent 10/06/2012  . Hemorrhoids 06/08/2012  . Osteoarthritis 03/08/2012  . Screening for breast cancer 03/08/2012  . Pernicious anemia 02/04/2012  . History of colectomy 02/04/2012  . Vitamin D deficiency 07/30/2011  . Chronic anticoagulation 06/13/2011  . Atrial fibrillation ([place]) 06/13/2011  . CAD (coronary artery disease) 06/13/2011  . Chronic diarrhea 06/13/2011    Rosalyn Gess OTR/L,CLT  01/18/2016, 2:35 PM  [place] [facility] [address], [place], [address] Phone: [phone]   Fax:  [phone]  Name: Kara [name] MRN: [id number] Date of Birth: 10/18/1922

## 2016-01-18 NOTE — Patient Instructions (Signed)
Contrast  Scar massage  3 x day Did Graston tools for soft tissue mobs and scar mobs over CT , and scar - brushing , sweeping and scooping with tool nr 2 and 4 - to decrease scar tissue, increase Tendon glides done - 10 reps  Med N glide  - 5 reps Not to force range - but easy light range - hold off on putty  Ice as needed

## 2016-01-21 LAB — POCT INR: INR: 2.5 — AB (ref 0.9–1.1)

## 2016-01-22 ENCOUNTER — Ambulatory Visit: Payer: Medicare Other | Admitting: Occupational Therapy

## 2016-01-22 DIAGNOSIS — M25641 Stiffness of right hand, not elsewhere classified: Secondary | ICD-10-CM | POA: Diagnosis not present

## 2016-01-22 DIAGNOSIS — R208 Other disturbances of skin sensation: Secondary | ICD-10-CM

## 2016-01-22 DIAGNOSIS — M6281 Muscle weakness (generalized): Secondary | ICD-10-CM | POA: Diagnosis not present

## 2016-01-22 NOTE — Therapy (Signed)
[place] [facility] [address] [place], [address] Phone: [phone]   Fax:  [phone]  Occupational Therapy Treatment  Patient Details  Name: Kara [name] MRN: [id number] Date of Birth: 16-Jun-1922 Referring Provider: Mack Guise  Encounter Date: 01/22/2016      OT End of Session - 01/22/16 1904    Visit Number 7   Number of Visits 8   Date for OT Re-Evaluation 02/06/16   OT Start Time 1127   OT Stop Time 1211   OT Time Calculation (min) 44 min   Activity Tolerance Patient tolerated treatment well   Behavior During Therapy [facility] for tasks assessed/performed      Past Medical History:  Diagnosis Date  . Allergy   . Atrial fibrillation ([place])   . CAD (coronary artery disease)   . Cancer (HCC)    UTERINE  . Chronic anticoagulation   . Chronic diarrhea   . Decubitus ulcer    sacral region  . Diabetes ([place])    diet controlled  . Dysuria   . Edema of lower extremity    mainly right foot, slightly in left foot.  . Fibrocystic breast disease   . GERD (gastroesophageal reflux disease)   . Glaucoma   . Glaucoma   . Hematuria   . Hemorrhoids   . History of colon polyps   . History of pancreatitis   . Hyperlipidemia   . Hypertension   . Hypokalemia   . IBS (irritable bowel syndrome)   . Microscopic hematuria   . Mitral valve regurgitation   . Osteoarthritis    fingers  . Pernicious anemia   . Plantar fasciitis   . Recurrent UTI   . Skin cancer   . Sleep apnea, obstructive   . Vaginal atrophy   . Vitamin D deficiency   . Yeast vaginitis     Past Surgical History:  Procedure Laterality Date  . ABDOMINAL HYSTERECTOMY  1980's  . ABDOMINAL SURGERY     for villous polyp,,,many years ago  . APPENDECTOMY  1940's  . ASCAD, s/p PTCA  11/28/2005   MID LESION   . BREAST BIOPSY Left 1970's  . CARPAL TUNNEL RELEASE Right 12/13/2015   Procedure: CARPAL TUNNEL RELEASE;  Surgeon: Thornton Park, MD;   Location: [facility] ORS;  Service: Orthopedics;  Laterality: Right;  . COLECTOMY    . PACEMAKER PLACEMENT    . radation     for uterine cance  . REFRACTIVE SURGERY     for bilateral glaumoma  . TONSILLECTOMY  1936    There were no vitals filed for this visit.      Subjective Assessment - 01/22/16 1211    Subjective  My hand felt better after I left last time - but then Sunday I felt some shooting pains - still some numbness or pins and needles in thumb , index and middle finger   Patient Stated Goals I want my hand better - like it was before - not to drop objects , pick up small objects    Currently in Pain? No/denies                      OT Treatments/Exercises (OP) - 01/22/16 0001      RUE Paraffin   Number Minutes Paraffin 10 Minutes   RUE Paraffin Location Hand   Comments At [facility] to decrease scar tissue , pain and numbness        Paraffin to  R hand at [facility]  CUS at 3.3MHZ, .8 intensity, for 4 min over CT prior to manual therpy  Did Graston tools for soft tissue mobs and scar mobs over CT , and scar - brushing , sweeping and scooping with tool nr 2 and 4 - to decrease scar tissue, increase ROM and decrease pain  CT spreads done 10 x  -  proximal scar - tender pin size scar adhesion Reinforce for pt to only do scar massage 3 x day - not constant rub it   Tendon glides done - verbal cue  Med N glide - needed min v/c - 5 reps Not to force range - but easy light range - hold off on putty  - pain was better after last time             OT Education - 01/22/16 1904    Education provided Yes   Education Details HEP same than last time   [employer]) Educated Patient   Methods Explanation;Demonstration;Tactile cues;Verbal cues   Comprehension Verbal cues required;Verbalized understanding;Returned demonstration          OT Short Term Goals - 12/26/15 1426      OT SHORT TERM GOAL #1   Title Pt to be independent in HEP to increase ROM, functional use  , and strength   Baseline very little knowledge of HEP    Time 3   Period Weeks   Status New     OT SHORT TERM GOAL #2   Title AROM in digits and wrist improve to WNL to write, bathing and dressing with no issues   Baseline See flowsheet for wrist and digits - impaired    Time 3   Period Weeks   Status New           OT Long Term Goals - 12/26/15 1433      OT LONG TERM GOAL #1   Title R Grip strength improve to at least 50% compare to L to turn doorknob , carry 5 lbs    Baseline NT - stitches still in    Time 4   Period Weeks   Status New     OT LONG TERM GOAL #2   Title Function on PRWHE improve with at least 20 points    Baseline Function at Eval 28.5/50    Time 6   Period Weeks   Status New     OT LONG TERM GOAL #3   Title Pt report improvement in sensory issues - to tell textures, write, do buttons and play cards    Baseline Pt report pins and needles and numb feeling thumb thru 4th    Time 6   Period Weeks   Status New               Plan - 01/22/16 1905    Clinical Impression Statement Pt cont to have sensory issue at thumb thru3rd - report pain at times on Sunday but better since last time - pts still to work on scar massage , tendon glides and Med N glide only 2-3 x day    Rehab Potential Good   OT Frequency 2x / week   OT Duration 2 weeks   OT Treatment/Interventions Self-care/ADL training;Cryotherapy;Ultrasound;Fluidtherapy;Patient/family education;Therapeutic exercises;Contrast Bath;Scar mobilization;Passive range of motion;Manual Therapy   Plan assess progress in sensation and pain    OT Home Exercise Plan see pt instruction    Consulted and Agree with Plan of Care Patient      Patient  will benefit from skilled therapeutic intervention in order to improve the following deficits and impairments:  Decreased coordination, Decreased range of motion, Impaired flexibility, Impaired sensation, Decreased knowledge of precautions, Pain, Impaired UE  functional use, Decreased scar mobility, Decreased strength  Visit Diagnosis: Stiffness of right hand, not elsewhere classified  Muscle weakness (generalized)  Other disturbances of skin sensation    Problem List Patient Active Problem List   Diagnosis Date Noted  . Urinary frequency 12/27/2015  . Atrophic vaginitis 09/26/2015  . Umbilical hernia without obstruction and without gangrene 06/26/2015  . Elevated blood sugar 05/29/2015  . Carpal tunnel syndrome of right wrist 05/29/2015  . Chronic fatigue 05/01/2015  . Left leg pain 05/01/2015  . Closed fracture of part of upper end of humerus 01/29/2015  . Skin lesion 01/29/2015  . Recurrent UTI 11/21/2014  . Vaginal atrophy 11/21/2014  . Urethral caruncle 11/21/2014  . Mitral valve regurgitation 05/09/2014  . History of pancreatitis 03/09/2014  . Medicare annual wellness visit, subsequent 10/06/2012  . Hemorrhoids 06/08/2012  . Osteoarthritis 03/08/2012  . Screening for breast cancer 03/08/2012  . Pernicious anemia 02/04/2012  . History of colectomy 02/04/2012  . Vitamin D deficiency 07/30/2011  . Chronic anticoagulation 06/13/2011  . Atrial fibrillation ([place]) 06/13/2011  . CAD (coronary artery disease) 06/13/2011  . Chronic diarrhea 06/13/2011    Rosalyn Gess OTR/L,CLT  01/22/2016, 7:08 PM  [place] [facility] [address], [place], [address] Phone: [phone]   Fax:  [phone]  Name: Kara [name] MRN: [id number] Date of Birth: Dec 07, 1922

## 2016-01-22 NOTE — Patient Instructions (Addendum)
Reinforce for pt to only do scar massage 3 x day - not constant rub it   Tendon glides done - verbal cue  Med N glide -  - 5 reps Not to force range - but easy light range - hold off on putty  - pain was better after last time

## 2016-01-24 ENCOUNTER — Encounter: Payer: Medicare Other | Admitting: Occupational Therapy

## 2016-01-25 ENCOUNTER — Ambulatory Visit: Payer: Medicare Other | Admitting: Occupational Therapy

## 2016-01-25 DIAGNOSIS — R208 Other disturbances of skin sensation: Secondary | ICD-10-CM | POA: Diagnosis not present

## 2016-01-25 DIAGNOSIS — M6281 Muscle weakness (generalized): Secondary | ICD-10-CM | POA: Diagnosis not present

## 2016-01-25 DIAGNOSIS — M25641 Stiffness of right hand, not elsewhere classified: Secondary | ICD-10-CM | POA: Diagnosis not present

## 2016-01-25 NOTE — Patient Instructions (Signed)
  Reinforce for pt to only do scar massage 3 x day - not constant rub it  Focus on proximal scar - and scar pad for night time   Can cont with  Tendon glides  Med N glide- 5 reps Not to force range - but easy light range

## 2016-01-25 NOTE — Therapy (Signed)
[place] [facility] [address], [place], [address] Phone: [phone]   Fax:  [phone]  Occupational Therapy Treatment  Patient Details  Name: Kara [name] MRN: [id number] Date of Birth: [birth year] Referring Provider: Mack Guise  Encounter Date: 01/25/2016      OT End of Session - 01/25/16 1252    Visit Number 8   Number of Visits 8   Date for OT Re-Evaluation 01/25/16   OT Start Time 1104   OT Stop Time 1148   OT Time Calculation (min) 44 min   Activity Tolerance Patient tolerated treatment well   Behavior During Therapy [facility] for tasks assessed/performed      Past Medical History:  Diagnosis Date  . Allergy   . Atrial fibrillation ([place])   . CAD (coronary artery disease)   . Cancer (HCC)    UTERINE  . Chronic anticoagulation   . Chronic diarrhea   . Decubitus ulcer    sacral region  . Diabetes ([place])    diet controlled  . Dysuria   . Edema of lower extremity    mainly right foot, slightly in left foot.  . Fibrocystic breast disease   . GERD (gastroesophageal reflux disease)   . Glaucoma   . Glaucoma   . Hematuria   . Hemorrhoids   . History of colon polyps   . History of pancreatitis   . Hyperlipidemia   . Hypertension   . Hypokalemia   . IBS (irritable bowel syndrome)   . Microscopic hematuria   . Mitral valve regurgitation   . Osteoarthritis    fingers  . Pernicious anemia   . Plantar fasciitis   . Recurrent UTI   . Skin cancer   . Sleep apnea, obstructive   . Vaginal atrophy   . Vitamin D deficiency   . Yeast vaginitis     Past Surgical History:  Procedure Laterality Date  . ABDOMINAL HYSTERECTOMY  1980's  . ABDOMINAL SURGERY     for villous polyp,,,many years ago  . APPENDECTOMY  1940's  . ASCAD, s/p PTCA  11/28/2005   MID LESION   . BREAST BIOPSY Left 1970's  . CARPAL TUNNEL RELEASE Right 12/13/2015   Procedure: CARPAL TUNNEL RELEASE;  Surgeon: Thornton Park, MD;   Location: [facility] ORS;  Service: Orthopedics;  Laterality: Right;  . COLECTOMY    . PACEMAKER PLACEMENT    . radation     for uterine cance  . REFRACTIVE SURGERY     for bilateral glaumoma  . TONSILLECTOMY  1936    There were no vitals filed for this visit.      Subjective Assessment - 01/25/16 1108    Subjective  Had some pain yesterday and did not do a lot - watch tv - some tightness in palm - and then the numbness/pins and needles - it is hard to tell if it is my arthritis or the CT picking up small ojbects    Patient Stated Goals I want my hand better - like it was before - not to drop objects , pick up small objects    Currently in Pain? Yes   Pain Score 1    Pain Location Hand   Pain Orientation Right   Pain Descriptors / Indicators Aching   Pain Type Surgical pain            [facility] OT Assessment - 01/25/16 0001      Strength   Right Hand Grip (lbs)  14   Right Hand Lateral Pinch 10 lbs   Left Hand Grip (lbs) 25   Left Hand Lateral Pinch 10 lbs   Left Hand 3 Point Pinch 11 lbs      Measurements taken see flowsheets   PRWHE done - simulate some of activities - pain 9/50 and function 12.5/50  Paraffin to R hand prior to manual therapy  Did Graston tools for soft tissue mobs and scar mobs over CT scar  - brushing , sweeping with tool nr 2 and 4 - to decrease scar tissue,  Focus on proximal adhesion - tender - pt to focus at home on that - to decrease pain  CT spreads done 10 x - proximal scar - tender pin size scar adhesion Reinforce for pt to only do scar massage 3 x day - not constant rub it   Can cont with  Tendon glides done  Med N glide - needed min v/c - 5 reps Not to force range - but easy light range              OT Treatments/Exercises (OP) - 01/25/16 0001      RUE Paraffin   Number Minutes Paraffin 10 Minutes   RUE Paraffin Location Hand   Comments prior to manual therapy to decrease scar tissue                 OT Education -  01/25/16 1252    Education provided Yes   Education Details HEP review - discuss progress and nerve healing    Person(s) Educated Patient   Methods Explanation;Demonstration;Tactile cues;Verbal cues   Comprehension Verbal cues required;Returned demonstration;Verbalized understanding          OT Short Term Goals - 01/25/16 1255      OT SHORT TERM GOAL #1   Title Pt to be independent in HEP to increase ROM, functional use , and strength   Baseline grip and 3 point grip improve, functional use in crease on PRWHE , ROM    Status Achieved     OT SHORT TERM GOAL #2   Title AROM in digits and wrist improve to WNL to write, bathing and dressing with no issues   Baseline still trouble with writing and buttons because of sensation issues   Status Partially Met           OT Long Term Goals - 01/25/16 1256      OT LONG TERM GOAL #1   Title R Grip strength improve to at least 50% compare to L to turn doorknob , carry 5 lbs    Baseline Grip 14 R , 25 on R - can carry 10 lbs with not problem   Status Achieved     OT LONG TERM GOAL #2   Title Function on PRWHE improve with at least 20 points    Baseline Function at Eval 28.5/50  and now 12.5/50   Status Partially Met     OT LONG TERM GOAL #3   Title Pt report improvement in sensory issues - to tell textures, write, do buttons and play cards    Baseline Pt report pins and needles and numb feeling thumb thru 3rd    Status Not Met               Plan - 01/25/16 1253    Clinical Impression Statement Pt made progress in grip strength and 3 poitn grip , increase functional use , and scar tissue - but cont to  have sensory issues at thumb thru 3rd , and some pain at times in palm - but not constant - pt do have arthritis in R 3rd digit - pt to see MD on MOnday and discuss progress and continues  sensory changes and  expectations of healing  - pt can cont at home with HEP - focus on scar tissue proximal more than distal    Rehab  Potential Good   OT Treatment/Interventions Self-care/ADL training;Cryotherapy;Ultrasound;Fluidtherapy;Patient/family education;Therapeutic exercises;Contrast Bath;Scar mobilization;Passive range of motion;Manual Therapy   Plan Pt to see MD on [place] see pt instruction    Consulted and Agree with Plan of Care Patient      Patient will benefit from skilled therapeutic intervention in order to improve the following deficits and impairments:  Decreased coordination, Decreased range of motion, Impaired flexibility, Impaired sensation, Decreased knowledge of precautions, Pain, Impaired UE functional use, Decreased scar mobility, Decreased strength  Visit Diagnosis: Stiffness of right hand, not elsewhere classified  Muscle weakness (generalized)  Other disturbances of skin sensation    Problem List Patient Active Problem List   Diagnosis Date Noted  . Urinary frequency 12/27/2015  . Atrophic vaginitis 09/26/2015  . Umbilical hernia without obstruction and without gangrene 06/26/2015  . Elevated blood sugar 05/29/2015  . Carpal tunnel syndrome of right wrist 05/29/2015  . Chronic fatigue 05/01/2015  . Left leg pain 05/01/2015  . Closed fracture of part of upper end of humerus 01/29/2015  . Skin lesion 01/29/2015  . Recurrent UTI 11/21/2014  . Vaginal atrophy 11/21/2014  . Urethral caruncle 11/21/2014  . Mitral valve regurgitation 05/09/2014  . History of pancreatitis 03/09/2014  . Medicare annual wellness visit, subsequent 10/06/2012  . Hemorrhoids 06/08/2012  . Osteoarthritis 03/08/2012  . Screening for breast cancer 03/08/2012  . Pernicious anemia 02/04/2012  . History of colectomy 02/04/2012  . Vitamin D deficiency 07/30/2011  . Chronic anticoagulation 06/13/2011  . Atrial fibrillation ([place]) 06/13/2011  . CAD (coronary artery disease) 06/13/2011  . Chronic diarrhea 06/13/2011    Rosalyn Gess OTR/L,CLT  01/25/2016, 1:02 PM  [place] [facility] [address], [place], [address] Phone: [phone]   Fax:  [phone]  Name: LEAHANNA BUSER MRN: [id number] Date of Birth: 05/03/1923

## 2016-01-28 LAB — POCT INR
INR: 2.1 — AB (ref 0.9–1.1)
INR: 2.1 — AB (ref <=1.1)

## 2016-01-30 ENCOUNTER — Ambulatory Visit: Payer: Medicare Other | Admitting: Occupational Therapy

## 2016-01-30 DIAGNOSIS — M6281 Muscle weakness (generalized): Secondary | ICD-10-CM | POA: Diagnosis not present

## 2016-01-30 DIAGNOSIS — R208 Other disturbances of skin sensation: Secondary | ICD-10-CM

## 2016-01-30 DIAGNOSIS — M25641 Stiffness of right hand, not elsewhere classified: Secondary | ICD-10-CM

## 2016-01-30 NOTE — Patient Instructions (Signed)
  Same HEP but add  Light blue putty gripping - but not tight with 3rd - stop when pain felt  And 3 point grip - 10 reps  No pain with last  2 x day for 10 reps

## 2016-01-30 NOTE — Therapy (Signed)
[facility] [address], [place], [address] Phone: [phone]   Fax:  [phone]  Occupational Therapy Treatment  Patient Details  Name: Kara [name] MRN: [id number] Date of Birth: 10-27-1922 Referring Provider: Mack Guise  Encounter Date: 01/30/2016      OT End of Session - 01/30/16 1059    Visit Number 9   Number of Visits 20   Date for OT Re-Evaluation 03/12/16   OT Start Time 0914   OT Stop Time 1002   OT Time Calculation (min) 48 min   Activity Tolerance Patient tolerated treatment well   Behavior During Therapy [place] [facility] for tasks assessed/performed      Past Medical History:  Diagnosis Date  . Allergy   . Atrial fibrillation ([place])   . CAD (coronary artery disease)   . Cancer (HCC)    UTERINE  . Chronic anticoagulation   . Chronic diarrhea   . Decubitus ulcer    sacral region  . Diabetes ([place])    diet controlled  . Dysuria   . Edema of lower extremity    mainly right foot, slightly in left foot.  . Fibrocystic breast disease   . GERD (gastroesophageal reflux disease)   . Glaucoma   . Glaucoma   . Hematuria   . Hemorrhoids   . History of colon polyps   . History of pancreatitis   . Hyperlipidemia   . Hypertension   . Hypokalemia   . IBS (irritable bowel syndrome)   . Microscopic hematuria   . Mitral valve regurgitation   . Osteoarthritis    fingers  . Pernicious anemia   . Plantar fasciitis   . Recurrent UTI   . Skin cancer   . Sleep apnea, obstructive   . Vaginal atrophy   . Vitamin D deficiency   . Yeast vaginitis     Past Surgical History:  Procedure Laterality Date  . ABDOMINAL HYSTERECTOMY  1980's  . ABDOMINAL SURGERY     for villous polyp,,,many years ago  . APPENDECTOMY  1940's  . ASCAD, s/p PTCA  11/28/2005   MID LESION   . BREAST BIOPSY Left 1970's  . CARPAL TUNNEL RELEASE Right 12/13/2015   Procedure: CARPAL TUNNEL RELEASE;  Surgeon: Thornton Park, MD;   Location: [facility] ORS;  Service: Orthopedics;  Laterality: Right;  . COLECTOMY    . PACEMAKER PLACEMENT    . radation     for uterine cance  . REFRACTIVE SURGERY     for bilateral glaumoma  . TONSILLECTOMY  1936    There were no vitals filed for this visit.      Subjective Assessment - 01/30/16 1056    Subjective  I seen Dr Mack Guise - he did not gaurenteed that numbness will get better - but the last few days more pins and needles - pain some last night but more my arthritis - but need little more strength needed - I know you tried the puttty before    Patient Stated Goals I want my hand better - like it was before - not to drop objects , pick up small objects    Currently in Pain? Yes   Pain Score 2    Pain Location Hand   Pain Orientation Right   Pain Descriptors / Indicators Aching   Pain Type Surgical pain   Pain Onset More than a month ago  OT Treatments/Exercises (OP) - 01/30/16 0001      RUE Paraffin   Number Minutes Paraffin 10 Minutes   RUE Paraffin Location Hand   Comments At [facility] to decrease pain , increase ROM and decrease scartissue     Paraffin to R hand prior to manual therapy  Did Graston tools for soft tissue mobs and scar mobs over CT scar  - brushing with tool nr 2 and 4 - to decrease scar tissue,  Focus on proximal adhesion - tender - pt to focus at home on that - to decrease pain  - also did some vibration and  xtractor this date - with less pain but still tender with manual and Graston tools  CT spreads done 10 x  Reinforce again to  only do scar massage 3 x day - not constant rub it   Tendon glides  Med N glide - needed min v/c - 5 reps Light blue putty gripping - but not tight with 3rd - stop when pain felt  And 3 point grip - 10 reps  No pain with last  2 x day for 10 reps               OT Education - 01/30/16 1059    Education provided Yes   Education Details HEP and putty added   Person(s)  Educated Patient   Methods Explanation;Demonstration;Tactile cues;Verbal cues;Handout   Comprehension Verbal cues required;Returned demonstration;Verbalized understanding          OT Short Term Goals - 01/30/16 1102      OT SHORT TERM GOAL #1   Title Pt to be independent in HEP to increase ROM, functional use , and strength   Baseline grip and 3 point grip improve, functional use in crease on PRWHE , ROM    Status Achieved     OT SHORT TERM GOAL #2   Title AROM in digits and wrist improve to WNL to write, bathing and dressing with no issues   Baseline still trouble with writing and buttons because of sensation issues   Time 3   Period Weeks   Status On-going           OT Long Term Goals - 01/30/16 1103      OT LONG TERM GOAL #1   Title R Grip strength improve to at least 50% compare to L to turn doorknob , carry 5 lbs    Baseline Grip 14 R , 25 on R - can carry 10 lbs with not problem   Status Achieved     OT LONG TERM GOAL #2   Title Function on PRWHE improve with at least 20 points    Baseline Function at Eval 28.5/50  and now 12.5/50   Time 6   Period Weeks   Status On-going     OT LONG TERM GOAL #3   Title Pt report improvement in sensory issues - to tell textures, write, do buttons and play cards    Baseline Pt report pins and needles and numb feeling thumb thru 3rd    Time 6   Period Weeks   Status On-going               Plan - 01/30/16 1100    Clinical Impression Statement Pt return this date after appt with surgeon - new order to cont OT for post CTR therapy - increase strength, decrease scar tissue and pain  - pt report that still numbness but the last couple of  days more pins and needles - 3rd digit still bother her with arthritis    Rehab Potential Good   OT Frequency 2x / week   OT Duration 6 weeks   OT Treatment/Interventions Self-care/ADL training;Cryotherapy;Ultrasound;Fluidtherapy;Patient/family education;Therapeutic exercises;Contrast  Bath;Scar mobilization;Passive range of motion;Manual Therapy   Plan Assess how doing with putty added to HEP   OT Home Exercise Plan see pt instruction    Consulted and Agree with Plan of Care Patient      Patient will benefit from skilled therapeutic intervention in order to improve the following deficits and impairments:  Decreased coordination, Decreased range of motion, Impaired flexibility, Impaired sensation, Decreased knowledge of precautions, Pain, Impaired UE functional use, Decreased scar mobility, Decreased strength  Visit Diagnosis: Stiffness of right hand, not elsewhere classified  Muscle weakness (generalized)  Other disturbances of skin sensation    Problem List Patient Active Problem List   Diagnosis Date Noted  . Urinary frequency 12/27/2015  . Atrophic vaginitis 09/26/2015  . Umbilical hernia without obstruction and without gangrene 06/26/2015  . Elevated blood sugar 05/29/2015  . Carpal tunnel syndrome of right wrist 05/29/2015  . Chronic fatigue 05/01/2015  . Left leg pain 05/01/2015  . Closed fracture of part of upper end of humerus 01/29/2015  . Skin lesion 01/29/2015  . Recurrent UTI 11/21/2014  . Vaginal atrophy 11/21/2014  . Urethral caruncle 11/21/2014  . Mitral valve regurgitation 05/09/2014  . History of pancreatitis 03/09/2014  . Medicare annual wellness visit, subsequent 10/06/2012  . Hemorrhoids 06/08/2012  . Osteoarthritis 03/08/2012  . Screening for breast cancer 03/08/2012  . Pernicious anemia 02/04/2012  . History of colectomy 02/04/2012  . Vitamin D deficiency 07/30/2011  . Chronic anticoagulation 06/13/2011  . Atrial fibrillation ([place]) 06/13/2011  . CAD (coronary artery disease) 06/13/2011  . Chronic diarrhea 06/13/2011    Rosalyn Gess 01/30/2016, 11:09 AM  [facility] [address], [place], [address] Phone: [phone]   Fax:  [phone]  Name:  Kara [name] MRN: [id number] Date of Birth: 13-Mar-1923

## 2016-02-02 NOTE — Discharge Summary (Signed)
Physician Discharge Summary  Patient ID: Kara [name] MRN: [id number] DOB/AGE: December 12, 1922 82 y.o.  Admit date: 12/12/2015 Discharge date: 02/02/2016  Admission Diagnoses:  G56.01: Carpal tunnel syndrome, right upper limb Hypokalemia  Discharge Diagnoses:  G56.01: Carpal tunnel syndrome, right upper limb   Past Medical History:  Diagnosis Date  . Allergy   . Atrial fibrillation ([place])   . CAD (coronary artery disease)   . Cancer (HCC)    UTERINE  . Chronic anticoagulation   . Chronic diarrhea   . Decubitus ulcer    sacral region  . Diabetes ([place])    diet controlled  . Dysuria   . Edema of lower extremity    mainly right foot, slightly in left foot.  . Fibrocystic breast disease   . GERD (gastroesophageal reflux disease)   . Glaucoma   . Glaucoma   . Hematuria   . Hemorrhoids   . History of colon polyps   . History of pancreatitis   . Hyperlipidemia   . Hypertension   . Hypokalemia   . IBS (irritable bowel syndrome)   . Microscopic hematuria   . Mitral valve regurgitation   . Osteoarthritis    fingers  . Pernicious anemia   . Plantar fasciitis   . Recurrent UTI   . Skin cancer   . Sleep apnea, obstructive   . Vaginal atrophy   . Vitamin D deficiency   . Yeast vaginitis     Surgeries: Procedure(s): CARPAL TUNNEL RELEASE on 12/13/2015   Consultants (if any): Treatment Team:  Epifanio Lesches, MD  Discharged Condition: Improved  Hospital Course: Kara [name] is an 82 y.o. female who was admitted 12/12/2015 with a diagnosis of  G56.01: Carpal tunnel syndrome, right upper limb and hypokalemia was admitted pre-op for IV potassium repletion <principal problem not specified> and went to the operating room on 12/13/2015 and underwent the above named procedures.    She was given perioperative antibiotics:  Anti-infectives    Start     Dose/Rate Route Frequency Ordered Stop   12/13/15 0448  ceFAZolin (ANCEF) IVPB 2g/100 mL premix     2 g 200 mL/hr over 30  Minutes Intravenous On call to O.R. 12/13/15 0448 12/13/15 1115    .   She benefited maximally from the hospital stay and there were no complications.    Recent vital signs:  Vitals:   12/13/15 1737 12/13/15 1819  BP:  139/79  Pulse:  72  Resp:  18  Temp: 97.9 F (36.6 C) 97.2 F (36.2 C)    Recent laboratory studies:  Lab Results  Component Value Date   HGB 14.1 12/12/2015   HGB 14.9 12/04/2015   HGB 13.5 11/22/2015   Lab Results  Component Value Date   WBC 7.5 12/12/2015   PLT 164 12/12/2015   Lab Results  Component Value Date   INR 2.5 (A) 01/21/2016   Lab Results  Component Value Date   NA 140 12/24/2015   K 3.6 12/24/2015   CL 103 12/24/2015   CO2 27 12/24/2015   BUN 15 12/24/2015   CREATININE 0.85 12/24/2015   GLUCOSE 173 (H) 12/24/2015    Discharge Medications:     Medication List    TAKE these medications   cholecalciferol 1000 units tablet Commonly known as:  VITAMIN D Take 1,000 Units by mouth daily.   cyanocobalamin 1000 MCG/ML injection Commonly known as:  (VITAMIN B-12) INJECT 1 ML  INTO THE MUSCLE EVERY 30 DAYS.   diltiazem 120  MG 12 hr capsule Commonly known as:  CARDIZEM SR Take 120 mg by mouth every morning.   diphenoxylate-atropine 2.5-0.025 MG tablet Commonly known as:  LOMOTIL TAKE 1 TABLET FOUR TIMES DAILY AS NEEDED FOR DIARHREA   estradiol 0.1 MG/GM vaginal cream Commonly known as:  ESTRACE Place 1 Applicatorful vaginally at bedtime. What changed:  when to take this  reasons to take this   GENTEAL 0.3 % Soln Generic drug:  Hypromellose Place 1 drop into both eyes daily as needed (dryness).   HYDROcodone-acetaminophen 5-325 MG tablet Commonly known as:  NORCO Take 1-2 tablets by mouth every 4 (four) hours as needed for moderate pain. MAXIMUM TOTAL ACETAMINOPHEN DOSE IS 4000 MG PER DAY What changed:  when to take this  additional instructions   hydrocortisone-pramoxine 2.5-1 % rectal cream Commonly known as:   ANALPRAM-HC APPLY RECTALLY THREE TIMES DAILY What changed:  See the new instructions.   lidocaine 5 % Commonly known as:  LIDODERM Place 1 patch onto the skin daily. Remove & Discard patch within 12 hours or as directed by MD   metoprolol 50 MG tablet Commonly known as:  LOPRESSOR TAKE 1 TABLET BY MOUTH 2  TIMES DAILY. What changed:  See the new instructions.   ondansetron 4 MG tablet Commonly known as:  ZOFRAN Take 1 tablet (4 mg total) by mouth every 8 (eight) hours as needed for nausea or vomiting.   triamcinolone cream 0.1 % Commonly known as:  KENALOG Apply 1 application topically 2 (two) times daily. What changed:  when to take this  reasons to take this   warfarin 1 MG tablet Commonly known as:  COUMADIN Take 1 mg by mouth daily at 6 PM.       Diagnostic Studies: No results found.  Disposition: 01-Home or Self Care  Discharge Instructions    Call MD / Call 911    Complete by:  As directed    If you experience chest pain or shortness of breath, CALL 911 and be transported to the hospital emergency room.  If you develope a fever above 101 F, pus (white drainage) or increased drainage or redness at the wound, or calf pain, call your surgeon's office.   Constipation Prevention    Complete by:  As directed    Drink plenty of fluids.  Prune juice may be helpful.  You may use a stool softener, such as Colace (over the counter) 100 mg twice a day.  Use MiraLax (over the counter) for constipation as needed.   Diet - low sodium heart healthy    Complete by:  As directed    Discharge instructions    Complete by:  As directed    Patient should elevate right upper extremity at all times over next 3-4 days.  She may use a sling for comfort.  She may apply ice to right hand to reduce swelling.  Keep dressing on and clean and dry until follow up next week.   Patient has appointment for next Monday.  Call Dr. Mack Guise if you lose feeling or motion of your fingers.   Driving  restrictions    Complete by:  As directed    No driving for until follow up next week   Increase activity slowly as tolerated    Complete by:  As directed          Signed: Thornton Park ,MD 02/02/2016, 4:27 PM

## 2016-02-02 NOTE — Discharge Summary (Signed)
Physician Discharge Summary  Patient ID: Kara [name] MRN: [id number] DOB/AGE: [birth year] 80 y.o.  Admit date: 12/12/2015 Discharge date: 12/13/15  Admission Diagnoses:  G56.01: Carpal tunnel syndrome, right upper limb Hypokalemia  Discharge Diagnoses:  G56.01: Carpal tunnel syndrome, right upper limb   Past Medical History:  Diagnosis Date  . Allergy   . Atrial fibrillation ([place])   . CAD (coronary artery disease)   . Cancer (HCC)    UTERINE  . Chronic anticoagulation   . Chronic diarrhea   . Decubitus ulcer    sacral region  . Diabetes ([place])    diet controlled  . Dysuria   . Edema of lower extremity    mainly right foot, slightly in left foot.  . Fibrocystic breast disease   . GERD (gastroesophageal reflux disease)   . Glaucoma   . Glaucoma   . Hematuria   . Hemorrhoids   . History of colon polyps   . History of pancreatitis   . Hyperlipidemia   . Hypertension   . Hypokalemia   . IBS (irritable bowel syndrome)   . Microscopic hematuria   . Mitral valve regurgitation   . Osteoarthritis    fingers  . Pernicious anemia   . Plantar fasciitis   . Recurrent UTI   . Skin cancer   . Sleep apnea, obstructive   . Vaginal atrophy   . Vitamin D deficiency   . Yeast vaginitis     Surgeries: Procedure(s): CARPAL TUNNEL RELEASE on 12/12/2015 - 12/13/2015   Consultants (if any): Treatment Team:  Epifanio Lesches, MD  Discharged Condition: Improved  Hospital Course: Kara [name] is an 82 y.o. female who was admitted 12/12/2015 with a diagnosis of  G56.01: Carpal tunnel syndrome, right upper limb and hypokalemia.  She was admitted pre-op for potassium repletion and went to the operating room on 12/13/2015 for an open carpal tunnel release.    She was given perioperative antibiotics:  Anti-infectives    Start     Dose/Rate Route Frequency Ordered Stop   12/13/15 0448  ceFAZolin (ANCEF) IVPB 2g/100 mL premix     2 g 200 mL/hr over 30 Minutes Intravenous On call to  O.R. 12/13/15 0448 12/13/15 1115    .   She benefited maximally from the hospital stay and there were no complications.    Recent vital signs:  Vitals:   12/13/15 1737 12/13/15 1819  BP:  139/79  Pulse:  72  Resp:  18  Temp: 97.9 F (36.6 C) 97.2 F (36.2 C)    Recent laboratory studies:  Lab Results  Component Value Date   HGB 14.1 12/12/2015   HGB 14.9 12/04/2015   HGB 13.5 11/22/2015   Lab Results  Component Value Date   WBC 7.5 12/12/2015   PLT 164 12/12/2015   Lab Results  Component Value Date   INR 2.5 (A) 01/21/2016   Lab Results  Component Value Date   NA 140 12/24/2015   K 3.6 12/24/2015   CL 103 12/24/2015   CO2 27 12/24/2015   BUN 15 12/24/2015   CREATININE 0.85 12/24/2015   GLUCOSE 173 (H) 12/24/2015    Discharge Medications:     Medication List    TAKE these medications   cholecalciferol 1000 units tablet Commonly known as:  VITAMIN D Take 1,000 Units by mouth daily.   cyanocobalamin 1000 MCG/ML injection Commonly known as:  (VITAMIN B-12) INJECT 1 ML  INTO THE MUSCLE EVERY 30 DAYS.   diltiazem 120 MG  12 hr capsule Commonly known as:  CARDIZEM SR Take 120 mg by mouth every morning.   diphenoxylate-atropine 2.5-0.025 MG tablet Commonly known as:  LOMOTIL TAKE 1 TABLET FOUR TIMES DAILY AS NEEDED FOR DIARHREA   estradiol 0.1 MG/GM vaginal cream Commonly known as:  ESTRACE Place 1 Applicatorful vaginally at bedtime. What changed:  when to take this  reasons to take this   GENTEAL 0.3 % Soln Generic drug:  Hypromellose Place 1 drop into both eyes daily as needed (dryness).   HYDROcodone-acetaminophen 5-325 MG tablet Commonly known as:  NORCO Take 1-2 tablets by mouth every 4 (four) hours as needed for moderate pain. MAXIMUM TOTAL ACETAMINOPHEN DOSE IS 4000 MG PER DAY What changed:  when to take this  additional instructions   hydrocortisone-pramoxine 2.5-1 % rectal cream Commonly known as:  ANALPRAM-HC APPLY RECTALLY  THREE TIMES DAILY What changed:  See the new instructions.   lidocaine 5 % Commonly known as:  LIDODERM Place 1 patch onto the skin daily. Remove & Discard patch within 12 hours or as directed by MD   metoprolol 50 MG tablet Commonly known as:  LOPRESSOR TAKE 1 TABLET BY MOUTH 2  TIMES DAILY. What changed:  See the new instructions.   ondansetron 4 MG tablet Commonly known as:  ZOFRAN Take 1 tablet (4 mg total) by mouth every 8 (eight) hours as needed for nausea or vomiting.   triamcinolone cream 0.1 % Commonly known as:  KENALOG Apply 1 application topically 2 (two) times daily. What changed:  when to take this  reasons to take this   warfarin 1 MG tablet Commonly known as:  COUMADIN Take 1 mg by mouth daily at 6 PM.       Diagnostic Studies: No results found.  Disposition: 01-Home or Self Care  Discharge Instructions    Call MD / Call 911    Complete by:  As directed    If you experience chest pain or shortness of breath, CALL 911 and be transported to the hospital emergency room.  If you develope a fever above 101 F, pus (white drainage) or increased drainage or redness at the wound, or calf pain, call your surgeon's office.   Constipation Prevention    Complete by:  As directed    Drink plenty of fluids.  Prune juice may be helpful.  You may use a stool softener, such as Colace (over the counter) 100 mg twice a day.  Use MiraLax (over the counter) for constipation as needed.   Diet - low sodium heart healthy    Complete by:  As directed    Discharge instructions    Complete by:  As directed    Patient should elevate right upper extremity at all times over next 3-4 days.  She may use a sling for comfort.  She may apply ice to right hand to reduce swelling.  Keep dressing on and clean and dry until follow up next week.   Patient has appointment for next Monday.  Call Dr. Mack Guise if you lose feeling or motion of your fingers.   Driving restrictions    Complete by:   As directed    No driving for until follow up next week   Increase activity slowly as tolerated    Complete by:  As directed          Signed: Thornton Park ,MD 02/02/2016, 4:20 PM

## 2016-02-04 ENCOUNTER — Encounter: Payer: Medicare Other | Admitting: Occupational Therapy

## 2016-02-05 ENCOUNTER — Ambulatory Visit: Payer: Medicare Other | Admitting: Occupational Therapy

## 2016-02-05 DIAGNOSIS — M6281 Muscle weakness (generalized): Secondary | ICD-10-CM

## 2016-02-05 DIAGNOSIS — R208 Other disturbances of skin sensation: Secondary | ICD-10-CM | POA: Diagnosis not present

## 2016-02-05 DIAGNOSIS — M25641 Stiffness of right hand, not elsewhere classified: Secondary | ICD-10-CM | POA: Diagnosis not present

## 2016-02-05 LAB — POCT INR: INR: 2.5 — AB (ref 0.9–1.1)

## 2016-02-05 NOTE — Patient Instructions (Signed)
HEat , or contrast  Scar massage and cica scar pad  Tendon glides  Med N glide Light blue putty gripping, 3 point grip done 12 reps  Add lat grip 12 reps  To do at home - should not have increase pain

## 2016-02-05 NOTE — Therapy (Signed)
[facility] [address] [place], [address] Phone: [phone]   Fax:  [phone]  Occupational Therapy Treatment  Patient Details  Name: Kara [name] MRN: [id number] Date of Birth: 27-Jan-1923 Referring Provider: Mack Guise  Encounter Date: 02/05/2016      OT End of Session - 02/05/16 1428    Visit Number 10   Number of Visits 20   Date for OT Re-Evaluation 03/12/16   OT Start Time 1134   OT Stop Time 1217   OT Time Calculation (min) 43 min   Activity Tolerance Patient tolerated treatment well   Behavior During Therapy [facility] for tasks assessed/performed      Past Medical History:  Diagnosis Date  . Allergy   . Atrial fibrillation ([place])   . CAD (coronary artery disease)   . Cancer (HCC)    UTERINE  . Chronic anticoagulation   . Chronic diarrhea   . Decubitus ulcer    sacral region  . Diabetes ([place])    diet controlled  . Dysuria   . Edema of lower extremity    mainly right foot, slightly in left foot.  . Fibrocystic breast disease   . GERD (gastroesophageal reflux disease)   . Glaucoma   . Glaucoma   . Hematuria   . Hemorrhoids   . History of colon polyps   . History of pancreatitis   . Hyperlipidemia   . Hypertension   . Hypokalemia   . IBS (irritable bowel syndrome)   . Microscopic hematuria   . Mitral valve regurgitation   . Osteoarthritis    fingers  . Pernicious anemia   . Plantar fasciitis   . Recurrent UTI   . Skin cancer   . Sleep apnea, obstructive   . Vaginal atrophy   . Vitamin D deficiency   . Yeast vaginitis     Past Surgical History:  Procedure Laterality Date  . ABDOMINAL HYSTERECTOMY  1980's  . ABDOMINAL SURGERY     for villous polyp,,,many years ago  . APPENDECTOMY  1940's  . ASCAD, s/p PTCA  11/28/2005   MID LESION   . BREAST BIOPSY Left 1970's  . CARPAL TUNNEL RELEASE Right 12/13/2015   Procedure: CARPAL TUNNEL RELEASE;  Surgeon: Thornton Park, MD;   Location: [facility] ORS;  Service: Orthopedics;  Laterality: Right;  . COLECTOMY    . PACEMAKER PLACEMENT    . radation     for uterine cance  . REFRACTIVE SURGERY     for bilateral glaumoma  . TONSILLECTOMY  1936    There were no vitals filed for this visit.      Subjective Assessment - 02/05/16 1426    Subjective  Doing okay - some pain but not all the time - here and there - maybe about 1-2/10  - putty doing okay    Patient Stated Goals I want my hand better - like it was before - not to drop objects , pick up small objects    Currently in Pain? Yes   Pain Score 1    Pain Location Hand   Pain Orientation Right   Pain Descriptors / Indicators Aching   Pain Type Surgical pain   Pain Onset More than a month ago                      OT Treatments/Exercises (OP) - 02/05/16 0001      RUE Paraffin   Number Minutes Paraffin 10  Minutes   RUE Paraffin Location Hand   Comments At [facility] to decreaes pain and scar tissue       Paraffin to R hand prior to manual therapy  Did [place] tools for soft tissue mobs and scar mobs over CT scar - brushing with tool nr 2 and 4 - to decrease scar tissue, Focus on proximal adhesion -not as  Tender this date - pt to focus at home on that - to decrease pain    CT spreads done 10 x  Composite extention stretch of digits and wrist done by OT   Tendon glides  Med N glide Light blue putty gripping, 3 point grip done 12 reps  Add lat grip 12 reps  To do at home - should not have increase pain  [place] at 3.3MHZ for 1.0 intensity , 20% over CT scar and thenar eminence  at end of session to decrease pain           OT Education - 02/05/16 1428    Education provided Yes   Education Details HEP    Person(s) Educated Patient   Methods Explanation;Tactile cues;Verbal cues;Demonstration   Comprehension Verbalized understanding;Returned demonstration;Verbal cues required          OT Short Term Goals - 01/30/16 1102      OT SHORT  TERM GOAL #1   Title Pt to be independent in HEP to increase ROM, functional use , and strength   Baseline grip and 3 point grip improve, functional use in crease on PRWHE , ROM    Status Achieved     OT SHORT TERM GOAL #2   Title AROM in digits and wrist improve to WNL to write, bathing and dressing with no issues   Baseline still trouble with writing and buttons because of sensation issues   Time 3   Period Weeks   Status On-going           OT Long Term Goals - 01/30/16 1103      OT LONG TERM GOAL #1   Title R Grip strength improve to at least 50% compare to L to turn doorknob , carry 5 lbs    Baseline Grip 14 R , 25 on R - can carry 10 lbs with not problem   Status Achieved     OT LONG TERM GOAL #2   Title Function on PRWHE improve with at least 20 points    Baseline Function at Eval 28.5/50  and now 12.5/50   Time 6   Period Weeks   Status On-going     OT LONG TERM GOAL #3   Title Pt report improvement in sensory issues - to tell textures, write, do buttons and play cards    Baseline Pt report pins and needles and numb feeling thumb thru 3rd    Time 6   Period Weeks   Status On-going               Plan - 02/05/16 1429    Clinical Impression Statement Pt report did okay with putty - have some pain 1-2/10 but mostly over thenar - appear pt do have some CMC arthritis - add this date lat grip , scar tissue still tight and adhere proximal  - numbness cont in digis    Rehab Potential Good   OT Frequency 2x / week   OT Duration 6 weeks   OT Treatment/Interventions Self-care/ADL training;Cryotherapy;Ultrasound;Fluidtherapy;Patient/family education;Therapeutic exercises;Contrast Bath;Scar mobilization;Passive range of motion;Manual Therapy   Plan assess PRWHE ,  grip and prehension again    OT Home Exercise Plan see pt instruction    Consulted and Agree with Plan of Care Patient      Patient will benefit from skilled therapeutic intervention in order to improve  the following deficits and impairments:  Decreased coordination, Decreased range of motion, Impaired flexibility, Impaired sensation, Decreased knowledge of precautions, Pain, Impaired UE functional use, Decreased scar mobility, Decreased strength  Visit Diagnosis: Stiffness of right hand, not elsewhere classified  Muscle weakness (generalized)  Other disturbances of skin sensation    Problem List Patient Active Problem List   Diagnosis Date Noted  . Urinary frequency 12/27/2015  . Atrophic vaginitis 09/26/2015  . Umbilical hernia without obstruction and without gangrene 06/26/2015  . Elevated blood sugar 05/29/2015  . Carpal tunnel syndrome of right wrist 05/29/2015  . Chronic fatigue 05/01/2015  . Left leg pain 05/01/2015  . Closed fracture of part of upper end of humerus 01/29/2015  . Skin lesion 01/29/2015  . Recurrent UTI 11/21/2014  . Vaginal atrophy 11/21/2014  . Urethral caruncle 11/21/2014  . Mitral valve regurgitation 05/09/2014  . History of pancreatitis 03/09/2014  . Medicare annual wellness visit, subsequent 10/06/2012  . Hemorrhoids 06/08/2012  . Osteoarthritis 03/08/2012  . Screening for breast cancer 03/08/2012  . Pernicious anemia 02/04/2012  . History of colectomy 02/04/2012  . Vitamin D deficiency 07/30/2011  . Chronic anticoagulation 06/13/2011  . Atrial fibrillation ([place]) 06/13/2011  . CAD (coronary artery disease) 06/13/2011  . Chronic diarrhea 06/13/2011    Rosalyn Gess OTR/L,CLT  02/05/2016, 2:35 PM  [facility] [address] [place], [address] Phone: [phone]   Fax:  [phone]  Name: EMAREE MANZELLA MRN: [id number] Date of Birth: 1922-10-19

## 2016-02-08 ENCOUNTER — Ambulatory Visit: Payer: Medicare Other | Admitting: Occupational Therapy

## 2016-02-08 DIAGNOSIS — R208 Other disturbances of skin sensation: Secondary | ICD-10-CM

## 2016-02-08 DIAGNOSIS — M25641 Stiffness of right hand, not elsewhere classified: Secondary | ICD-10-CM

## 2016-02-08 DIAGNOSIS — M6281 Muscle weakness (generalized): Secondary | ICD-10-CM | POA: Diagnosis not present

## 2016-02-08 NOTE — Therapy (Signed)
[facility] [address] [place], [address] Phone: [phone]   Fax:  [phone]  Occupational Therapy Treatment  Patient Details  Name: Kara [name] MRN: [id number] Date of Birth: 05-14-1923 Referring Provider: Mack Guise  Encounter Date: 02/08/2016      OT End of Session - 02/08/16 1418    Visit Number 11   Number of Visits 20   Date for OT Re-Evaluation 03/12/16   OT Start Time 1033   OT Stop Time 1118   OT Time Calculation (min) 45 min   Activity Tolerance Patient tolerated treatment well   Behavior During Therapy [facility] for tasks assessed/performed      Past Medical History:  Diagnosis Date  . Allergy   . Atrial fibrillation ([place])   . CAD (coronary artery disease)   . Cancer (HCC)    UTERINE  . Chronic anticoagulation   . Chronic diarrhea   . Decubitus ulcer    sacral region  . Diabetes ([place])    diet controlled  . Dysuria   . Edema of lower extremity    mainly right foot, slightly in left foot.  . Fibrocystic breast disease   . GERD (gastroesophageal reflux disease)   . Glaucoma   . Glaucoma   . Hematuria   . Hemorrhoids   . History of colon polyps   . History of pancreatitis   . Hyperlipidemia   . Hypertension   . Hypokalemia   . IBS (irritable bowel syndrome)   . Microscopic hematuria   . Mitral valve regurgitation   . Osteoarthritis    fingers  . Pernicious anemia   . Plantar fasciitis   . Recurrent UTI   . Skin cancer   . Sleep apnea, obstructive   . Vaginal atrophy   . Vitamin D deficiency   . Yeast vaginitis     Past Surgical History:  Procedure Laterality Date  . ABDOMINAL HYSTERECTOMY  1980's  . ABDOMINAL SURGERY     for villous polyp,,,many years ago  . APPENDECTOMY  1940's  . ASCAD, s/p PTCA  11/28/2005   MID LESION   . BREAST BIOPSY Left 1970's  . CARPAL TUNNEL RELEASE Right 12/13/2015   Procedure: CARPAL TUNNEL RELEASE;  Surgeon: Thornton Park, MD;   Location: [facility] ORS;  Service: Orthopedics;  Laterality: Right;  . COLECTOMY    . PACEMAKER PLACEMENT    . radation     for uterine cance  . REFRACTIVE SURGERY     for bilateral glaumoma  . TONSILLECTOMY  1936    There were no vitals filed for this visit.      Subjective Assessment - 02/08/16 1417    Subjective  Doing better - still some pins and needles- pain comes and goes - but not bad - do you think using the mouse at my computer bother my hand   Patient Stated Goals I want my hand better - like it was before - not to drop objects , pick up small objects    Currently in Pain? Yes   Pain Score 1    Pain Location Hand   Pain Orientation Right   Pain Descriptors / Indicators Aching   Pain Type Surgical pain                      OT Treatments/Exercises (OP) - 02/08/16 0001      RUE Paraffin   Number Minutes Paraffin 10 Minutes   RUE  Paraffin Location Hand   Comments AT [facility] to decrease pain and scar tissue      Paraffin to R hand prior to manual therapy  Did vibration this date  for soft tissue mobs and scar mobs over CT scar and thenar eminence -to  decrease scar tissue, Focus on proximal adhesion -not as  Tender this date and moving better   - pt to cont to  focus at home on that - to decrease pain    CT spreads done 10 x  Composite extention stretch of digits and wrist done by OT   Light blue putty gripping, 3 point grip done 12 reps  And  lat grip 12 reps  - needed mod A for lat grip  AROM for PA and RA of thumb  Add rubber band for resistance for RA , needed place and hold for PA - fatigue after 8 -10 reps  Pt to do 2 sets at home - needed mod A and v/c  SHould not have pain with HEP  [place] at 3.3MHZ for 1.0 intensity , 20%  For 5 min over CT scar and thenar eminence  at end of session to decrease pain             OT Education - 02/08/16 1418    Education provided Yes   Education Details HEP change   Person(s) Educated Patient    Methods Explanation;Demonstration;Tactile cues;Handout   Comprehension Verbal cues required;Returned demonstration;Verbalized understanding          OT Short Term Goals - 01/30/16 1102      OT SHORT TERM GOAL #1   Title Pt to be independent in HEP to increase ROM, functional use , and strength   Baseline grip and 3 point grip improve, functional use in crease on PRWHE , ROM    Status Achieved     OT SHORT TERM GOAL #2   Title AROM in digits and wrist improve to WNL to write, bathing and dressing with no issues   Baseline still trouble with writing and buttons because of sensation issues   Time 3   Period Weeks   Status On-going           OT Long Term Goals - 01/30/16 1103      OT LONG TERM GOAL #1   Title R Grip strength improve to at least 50% compare to L to turn doorknob , carry 5 lbs    Baseline Grip 14 R , 25 on R - can carry 10 lbs with not problem   Status Achieved     OT LONG TERM GOAL #2   Title Function on PRWHE improve with at least 20 points    Baseline Function at Eval 28.5/50  and now 12.5/50   Time 6   Period Weeks   Status On-going     OT LONG TERM GOAL #3   Title Pt report improvement in sensory issues - to tell textures, write, do buttons and play cards    Baseline Pt report pins and needles and numb feeling thumb thru 3rd    Time 6   Period Weeks   Status On-going               Plan - 02/08/16 1419    Clinical Impression Statement Pt report some pain but not bad , pins and needles still cont - pt show decrease strength in PA of thumb - add some strengthening this date - and reviewed again lat grip -  pt to cont with scar massage    Rehab Potential Good   OT Frequency 2x / week   OT Duration 6 weeks   OT Treatment/Interventions Self-care/ADL training;Cryotherapy;Ultrasound;Fluidtherapy;Patient/family education;Therapeutic exercises;Contrast Bath;Scar mobilization;Passive range of motion;Manual Therapy   Plan assess PRWHE , grip and  prehension    OT Home Exercise Plan see pt instruction    Consulted and Agree with Plan of Care Patient      Patient will benefit from skilled therapeutic intervention in order to improve the following deficits and impairments:  Decreased coordination, Decreased range of motion, Impaired flexibility, Impaired sensation, Decreased knowledge of precautions, Pain, Impaired UE functional use, Decreased scar mobility, Decreased strength  Visit Diagnosis: Stiffness of right hand, not elsewhere classified  Muscle weakness (generalized)  Other disturbances of skin sensation      G-Codes - February 16, 2016 1429    Functional Assessment Tool Used ROM , pain , PRWHE , grip and prehension - clinical judgement   Functional Limitation Self care   Self Care Current Status [id number]) At least 20 percent but less than 40 percent impaired, limited or restricted   Self Care Goal Status [id number]) At least 1 percent but less than 20 percent impaired, limited or restricted      Problem List Patient Active Problem List   Diagnosis Date Noted  . Urinary frequency 12/27/2015  . Atrophic vaginitis 09/26/2015  . Umbilical hernia without obstruction and without gangrene 06/26/2015  . Elevated blood sugar 05/29/2015  . Carpal tunnel syndrome of right wrist 05/29/2015  . Chronic fatigue 05/01/2015  . Left leg pain 05/01/2015  . Closed fracture of part of upper end of humerus 01/29/2015  . Skin lesion 01/29/2015  . Recurrent UTI 11/21/2014  . Vaginal atrophy 11/21/2014  . Urethral caruncle 11/21/2014  . Mitral valve regurgitation 05/09/2014  . History of pancreatitis 03/09/2014  . Medicare annual wellness visit, subsequent 10/06/2012  . Hemorrhoids 06/08/2012  . Osteoarthritis 03/08/2012  . Screening for breast cancer 03/08/2012  . Pernicious anemia 02/04/2012  . History of colectomy 02/04/2012  . Vitamin D deficiency 07/30/2011  . Chronic anticoagulation 06/13/2011  . Atrial fibrillation ([place]) 06/13/2011  .  CAD (coronary artery disease) 06/13/2011  . Chronic diarrhea 06/13/2011    Rosalyn Gess OTR/L,CLT  02/16/2016, 2:30 PM  [facility] [address] [place], [address] Phone: [phone]   Fax:  [phone]  Name: LASHAUNE FREIMARK MRN: [id number] Date of Birth: Aug 13, 1922

## 2016-02-08 NOTE — Patient Instructions (Signed)
Cont with heat  scar mobs and scar pad  Med N glide Tendon glide  Light blue putty gripping, 3 point grip  lat grip 12 reps  AROM for PA and RA of thumb  Add rubber band for resistance for RA , needed place and hold for PA - fatigue after 8 -10 reps  Pt to do 2 sets at home - needed mod A and v/c  SHould not have pain with HEP

## 2016-02-09 DIAGNOSIS — Z23 Encounter for immunization: Secondary | ICD-10-CM | POA: Diagnosis not present

## 2016-02-12 ENCOUNTER — Ambulatory Visit: Payer: Medicare Other | Admitting: Occupational Therapy

## 2016-02-12 DIAGNOSIS — R208 Other disturbances of skin sensation: Secondary | ICD-10-CM | POA: Diagnosis not present

## 2016-02-12 DIAGNOSIS — M6281 Muscle weakness (generalized): Secondary | ICD-10-CM

## 2016-02-12 DIAGNOSIS — I4891 Unspecified atrial fibrillation: Secondary | ICD-10-CM | POA: Diagnosis not present

## 2016-02-12 DIAGNOSIS — M25641 Stiffness of right hand, not elsewhere classified: Secondary | ICD-10-CM

## 2016-02-12 LAB — POCT INR: INR: 2 — AB (ref <=1.1)

## 2016-02-12 NOTE — Therapy (Signed)
[place] [facility] [address] [place], [address] Phone: [phone]   Fax:  [phone]  Occupational Therapy Treatment  Patient Details  Name: Kara [name] MRN: [id number] Date of Birth: [birth year] Referring Provider: Mack Guise  Encounter Date: 02/12/2016      OT End of Session - 02/12/16 1130    Visit Number 12   Number of Visits 20   Date for OT Re-Evaluation 03/12/16   OT Start Time 1116   OT Stop Time 1205   OT Time Calculation (min) 49 min   Activity Tolerance Patient tolerated treatment well   Behavior During Therapy [facility] for tasks assessed/performed      Past Medical History:  Diagnosis Date  . Allergy   . Atrial fibrillation ([place])   . CAD (coronary artery disease)   . Cancer (HCC)    UTERINE  . Chronic anticoagulation   . Chronic diarrhea   . Decubitus ulcer    sacral region  . Diabetes ([place])    diet controlled  . Dysuria   . Edema of lower extremity    mainly right foot, slightly in left foot.  . Fibrocystic breast disease   . GERD (gastroesophageal reflux disease)   . Glaucoma   . Glaucoma   . Hematuria   . Hemorrhoids   . History of colon polyps   . History of pancreatitis   . Hyperlipidemia   . Hypertension   . Hypokalemia   . IBS (irritable bowel syndrome)   . Microscopic hematuria   . Mitral valve regurgitation   . Osteoarthritis    fingers  . Pernicious anemia   . Plantar fasciitis   . Recurrent UTI   . Skin cancer   . Sleep apnea, obstructive   . Vaginal atrophy   . Vitamin D deficiency   . Yeast vaginitis     Past Surgical History:  Procedure Laterality Date  . ABDOMINAL HYSTERECTOMY  1980's  . ABDOMINAL SURGERY     for villous polyp,,,many years ago  . APPENDECTOMY  1940's  . ASCAD, s/p PTCA  11/28/2005   MID LESION   . BREAST BIOPSY Left 1970's  . CARPAL TUNNEL RELEASE Right 12/13/2015   Procedure: CARPAL TUNNEL RELEASE;  Surgeon: Thornton Park, MD;   Location: [facility] ORS;  Service: Orthopedics;  Laterality: Right;  . COLECTOMY    . PACEMAKER PLACEMENT    . radation     for uterine cance  . REFRACTIVE SURGERY     for bilateral glaumoma  . TONSILLECTOMY  1936    There were no vitals filed for this visit.      Subjective Assessment - 02/12/16 1116    Subjective  You need to check how I did the new exercises - I think I did wrong - pain was little worse - at times    Patient Stated Goals I want my hand better - like it was before - not to drop objects , pick up small objects    Currently in Pain? Yes   Pain Score 2    Pain Location Hand   Pain Orientation Right   Pain Descriptors / Indicators Aching                      OT Treatments/Exercises (OP) - 02/12/16 0001      RUE Paraffin   Number Minutes Paraffin 10 Minutes   RUE Paraffin Location Hand   Comments at [facility] to decrease  pain and scar tissue       Paraffin to R hand prior to manual therapy  Vibration   for soft tissue mobs and scar mobs over CT scar and thenar eminence -to  decrease scar tissue, Focus on proximal adhesion - moving better and less tenderness   - pt to cont to  focus at home on that - to decrease pain   CT spreads done 10 x  Composite extention stretch of digits and wrist done by OT  Reviewed HEP with pt - she was not sure if doing correct - and what one causing increase pain since last time  Light blue putty gripping, 3 point grip done 12 reps  And  lat grip 12 reps  - needed min A for lat grip  AROM for PA and RA of thumb  Rubber band for resistance for RA  10 reps , needed place and hold for PA -needed mod A - but has some pain - pt to hold off on PA    [place] at 3.3MHZ for 1.0 intensity , 20%  For 5 min over CT scar and thenar eminence at end of session to decrease pain             OT Education - 02/12/16 1130    Education provided Yes   Education Details HEP review   Person(s) Educated Patient   Methods  Demonstration;Tactile cues;Verbal cues;Explanation   Comprehension Verbalized understanding;Verbal cues required          OT Short Term Goals - 01/30/16 1102      OT SHORT TERM GOAL #1   Title Pt to be independent in HEP to increase ROM, functional use , and strength   Baseline grip and 3 point grip improve, functional use in crease on PRWHE , ROM    Status Achieved     OT SHORT TERM GOAL #2   Title AROM in digits and wrist improve to WNL to write, bathing and dressing with no issues   Baseline still trouble with writing and buttons because of sensation issues   Time 3   Period Weeks   Status On-going           OT Long Term Goals - 01/30/16 1103      OT LONG TERM GOAL #1   Title R Grip strength improve to at least 50% compare to L to turn doorknob , carry 5 lbs    Baseline Grip 14 R , 25 on R - can carry 10 lbs with not problem   Status Achieved     OT LONG TERM GOAL #2   Title Function on PRWHE improve with at least 20 points    Baseline Function at Eval 28.5/50  and now 12.5/50   Time 6   Period Weeks   Status On-going     OT LONG TERM GOAL #3   Title Pt report improvement in sensory issues - to tell textures, write, do buttons and play cards    Baseline Pt report pins and needles and numb feeling thumb thru 3rd    Time 6   Period Weeks   Status On-going               Plan - 02/12/16 1130    Clinical Impression Statement Pt had less pain last time and for day after last session - but then some increase pain - not bad - pt to hold off on PA with resistance - compensate and strain to much -  scar adhesion improving -  pt at end of session no pain - excpet arthritis pain at 3rd diigt -    Rehab Potential Good   OT Frequency 2x / week   OT Duration 4 weeks   OT Treatment/Interventions Self-care/ADL training;Cryotherapy;Ultrasound;Fluidtherapy;Patient/family education;Therapeutic exercises;Contrast Bath;Scar mobilization;Passive range of motion;Manual Therapy    Plan assess PRWHE and girp /prehension    OT Home Exercise Plan see pt instruction    Consulted and Agree with Plan of Care Patient      Patient will benefit from skilled therapeutic intervention in order to improve the following deficits and impairments:  Decreased coordination, Decreased range of motion, Impaired flexibility, Impaired sensation, Decreased knowledge of precautions, Pain, Impaired UE functional use, Decreased scar mobility, Decreased strength  Visit Diagnosis: Stiffness of right hand, not elsewhere classified  Other disturbances of skin sensation  Muscle weakness (generalized)    Problem List Patient Active Problem List   Diagnosis Date Noted  . Urinary frequency 12/27/2015  . Atrophic vaginitis 09/26/2015  . Umbilical hernia without obstruction and without gangrene 06/26/2015  . Elevated blood sugar 05/29/2015  . Carpal tunnel syndrome of right wrist 05/29/2015  . Chronic fatigue 05/01/2015  . Left leg pain 05/01/2015  . Closed fracture of part of upper end of humerus 01/29/2015  . Skin lesion 01/29/2015  . Recurrent UTI 11/21/2014  . Vaginal atrophy 11/21/2014  . Urethral caruncle 11/21/2014  . Mitral valve regurgitation 05/09/2014  . History of pancreatitis 03/09/2014  . Medicare annual wellness visit, subsequent 10/06/2012  . Hemorrhoids 06/08/2012  . Osteoarthritis 03/08/2012  . Screening for breast cancer 03/08/2012  . Pernicious anemia 02/04/2012  . History of colectomy 02/04/2012  . Vitamin D deficiency 07/30/2011  . Chronic anticoagulation 06/13/2011  . Atrial fibrillation ([place]) 06/13/2011  . CAD (coronary artery disease) 06/13/2011  . Chronic diarrhea 06/13/2011    Rosalyn Gess OTR/L,CLT  02/12/2016, 12:26 PM  [place] [facility] [address], [place], [address] Phone: [phone]   Fax:  [phone]  Name: Kara [name] MRN: [id number] Date of Birth: May 26, 1922

## 2016-02-12 NOTE — Patient Instructions (Addendum)
  Reviewed HEP with pt - she was not sure if doing correct - and what one causing increase pain since last time  Light blue putty gripping, 3 point grip done 12 reps  And  lat grip 12 reps  - needed min A for lat grip  AROM for PA and RA of thumb  Rubber band for resistance for RA  10 reps , needed place and hold for PA -needed mod A - but has some pain - pt to hold off on PA

## 2016-02-15 ENCOUNTER — Ambulatory Visit: Payer: Medicare Other | Admitting: Occupational Therapy

## 2016-02-15 DIAGNOSIS — M6281 Muscle weakness (generalized): Secondary | ICD-10-CM

## 2016-02-15 DIAGNOSIS — R208 Other disturbances of skin sensation: Secondary | ICD-10-CM

## 2016-02-15 DIAGNOSIS — M25641 Stiffness of right hand, not elsewhere classified: Secondary | ICD-10-CM | POA: Diagnosis not present

## 2016-02-15 NOTE — Patient Instructions (Addendum)
Same HEP  

## 2016-02-15 NOTE — Therapy (Signed)
[place] [facility] [address] [place], [address] Phone: [phone]   Fax:  [phone]  Occupational Therapy Treatment  Patient Details  Name: Kara [name] MRN: [id number] Date of Birth: 1923-01-26 Referring Provider: Mack Guise  Encounter Date: 02/15/2016      OT End of Session - 02/15/16 1430    Visit Number 13   Number of Visits 20   Date for OT Re-Evaluation 03/12/16   OT Start Time 1107   OT Stop Time 1150   OT Time Calculation (min) 43 min   Activity Tolerance Patient tolerated treatment well   Behavior During Therapy [facility] for tasks assessed/performed      Past Medical History:  Diagnosis Date  . Allergy   . Atrial fibrillation ([place])   . CAD (coronary artery disease)   . Cancer (HCC)    UTERINE  . Chronic anticoagulation   . Chronic diarrhea   . Decubitus ulcer    sacral region  . Diabetes ([place])    diet controlled  . Dysuria   . Edema of lower extremity    mainly right foot, slightly in left foot.  . Fibrocystic breast disease   . GERD (gastroesophageal reflux disease)   . Glaucoma   . Glaucoma   . Hematuria   . Hemorrhoids   . History of colon polyps   . History of pancreatitis   . Hyperlipidemia   . Hypertension   . Hypokalemia   . IBS (irritable bowel syndrome)   . Microscopic hematuria   . Mitral valve regurgitation   . Osteoarthritis    fingers  . Pernicious anemia   . Plantar fasciitis   . Recurrent UTI   . Skin cancer   . Sleep apnea, obstructive   . Vaginal atrophy   . Vitamin D deficiency   . Yeast vaginitis     Past Surgical History:  Procedure Laterality Date  . ABDOMINAL HYSTERECTOMY  1980's  . ABDOMINAL SURGERY     for villous polyp,,,many years ago  . APPENDECTOMY  1940's  . ASCAD, s/p PTCA  11/28/2005   MID LESION   . BREAST BIOPSY Left 1970's  . CARPAL TUNNEL RELEASE Right 12/13/2015   Procedure: CARPAL TUNNEL RELEASE;  Surgeon: Thornton Park, MD;   Location: [facility] ORS;  Service: Orthopedics;  Laterality: Right;  . COLECTOMY    . PACEMAKER PLACEMENT    . radation     for uterine cance  . REFRACTIVE SURGERY     for bilateral glaumoma  . TONSILLECTOMY  1936    There were no vitals filed for this visit.      Subjective Assessment - 02/15/16 1116    Subjective  About the same - maybe little more strenght - but pins and needles about the same - and some tightness or pain at thumb    Patient Stated Goals I want my hand better - like it was before - not to drop objects , pick up small objects    Currently in Pain? Yes   Pain Score 2    Pain Location Hand   Pain Orientation Right   Pain Descriptors / Indicators Aching            [facility] OT Assessment - 02/15/16 0001      Strength   Right Hand Grip (lbs) 20   Right Hand Lateral Pinch 9 lbs   Right Hand 3 Point Pinch 5 lbs   Left Hand Grip (lbs) 26  Left Hand Lateral Pinch 11 lbs   Left Hand 3 Point Pinch 10 lbs                  OT Treatments/Exercises (OP) - 02/15/16 0001      RUE Paraffin   Number Minutes Paraffin 10 Minutes   RUE Paraffin Location Hand   Comments at [facility] to decrease pain and scar tissue      Grip and prehension strength assess See flow sheet   Paraffin to R hand prior to manual therapy  Vibration  for soft tissue mobs and scar mobs over CT scar and thenar eminence -to decrease scar tissue, Focus on proximal adhesion - moving better and less tenderness -xtractor use  5x   CT spreads done 10 x  Composite extention stretch of digits and wrist done by OT   Light blue putty gripping, 3 point  And Lat grip Needed only min v/c  12 reps  AROM for PA and RA of thumb  Rubber band for resistance for RA  10 reps , needed place and hold for PA -needed min A - but should be pain free    [place] at 3.3MHZ for 1.0 intensity , 20% For 5 min over CT scar and thenar eminence at end of session to decrease pain              OT  Education - 02/15/16 1429    Education provided Yes   Education Details HEP   Person(s) Educated Patient   Methods Explanation;Demonstration;Tactile cues;Verbal cues   Comprehension Verbal cues required;Returned demonstration;Verbalized understanding          OT Short Term Goals - 01/30/16 1102      OT SHORT TERM GOAL #1   Title Pt to be independent in HEP to increase ROM, functional use , and strength   Baseline grip and 3 point grip improve, functional use in crease on PRWHE , ROM    Status Achieved     OT SHORT TERM GOAL #2   Title AROM in digits and wrist improve to WNL to write, bathing and dressing with no issues   Baseline still trouble with writing and buttons because of sensation issues   Time 3   Period Weeks   Status On-going           OT Long Term Goals - 01/30/16 1103      OT LONG TERM GOAL #1   Title R Grip strength improve to at least 50% compare to L to turn doorknob , carry 5 lbs    Baseline Grip 14 R , 25 on R - can carry 10 lbs with not problem   Status Achieved     OT LONG TERM GOAL #2   Title Function on PRWHE improve with at least 20 points    Baseline Function at Eval 28.5/50  and now 12.5/50   Time 6   Period Weeks   Status On-going     OT LONG TERM GOAL #3   Title Pt report improvement in sensory issues - to tell textures, write, do buttons and play cards    Baseline Pt report pins and needles and numb feeling thumb thru 3rd    Time 6   Period Weeks   Status On-going               Plan - 02/15/16 1430    Clinical Impression Statement Pt made great progress in grip strength in R hand since month ago -  pt report still some pain in thumb and palm at times - and pins and needles - pt to cont with HEP - pt do have arthritis in 3rd digit with some pain    Rehab Potential Good   OT Frequency 2x / week   OT Duration 4 weeks   OT Treatment/Interventions Self-care/ADL training;Cryotherapy;Ultrasound;Fluidtherapy;Patient/family  education;Therapeutic exercises;Contrast Bath;Scar mobilization;Passive range of motion;Manual Therapy   Plan PRWHE to be done    OT Home Exercise Plan see pt instruction    Consulted and Agree with Plan of Care Patient      Patient will benefit from skilled therapeutic intervention in order to improve the following deficits and impairments:  Decreased coordination, Decreased range of motion, Impaired flexibility, Impaired sensation, Decreased knowledge of precautions, Pain, Impaired UE functional use, Decreased scar mobility, Decreased strength  Visit Diagnosis: Stiffness of right hand, not elsewhere classified  Other disturbances of skin sensation  Muscle weakness (generalized)    Problem List Patient Active Problem List   Diagnosis Date Noted  . Urinary frequency 12/27/2015  . Atrophic vaginitis 09/26/2015  . Umbilical hernia without obstruction and without gangrene 06/26/2015  . Elevated blood sugar 05/29/2015  . Carpal tunnel syndrome of right wrist 05/29/2015  . Chronic fatigue 05/01/2015  . Left leg pain 05/01/2015  . Closed fracture of part of upper end of humerus 01/29/2015  . Skin lesion 01/29/2015  . Recurrent UTI 11/21/2014  . Vaginal atrophy 11/21/2014  . Urethral caruncle 11/21/2014  . Mitral valve regurgitation 05/09/2014  . History of pancreatitis 03/09/2014  . Medicare annual wellness visit, subsequent 10/06/2012  . Hemorrhoids 06/08/2012  . Osteoarthritis 03/08/2012  . Screening for breast cancer 03/08/2012  . Pernicious anemia 02/04/2012  . History of colectomy 02/04/2012  . Vitamin D deficiency 07/30/2011  . Chronic anticoagulation 06/13/2011  . Atrial fibrillation ([place]) 06/13/2011  . CAD (coronary artery disease) 06/13/2011  . Chronic diarrhea 06/13/2011    Rosalyn Gess OTR/L,CLT 02/15/2016, 2:32 PM  [place] [facility] [address] [place], [address] Phone: [phone]    Fax:  [phone]  Name: Kara [name] MRN: [id number] Date of Birth: [birth year]

## 2016-02-19 ENCOUNTER — Ambulatory Visit: Payer: Medicare Other | Attending: Orthopedic Surgery | Admitting: Occupational Therapy

## 2016-02-19 DIAGNOSIS — M25641 Stiffness of right hand, not elsewhere classified: Secondary | ICD-10-CM | POA: Insufficient documentation

## 2016-02-19 DIAGNOSIS — R208 Other disturbances of skin sensation: Secondary | ICD-10-CM | POA: Diagnosis not present

## 2016-02-19 DIAGNOSIS — M6281 Muscle weakness (generalized): Secondary | ICD-10-CM

## 2016-02-19 LAB — POCT INR: INR: 2.3 — AB (ref <=1.1)

## 2016-02-19 NOTE — Therapy (Signed)
[place] [facility] [address], [place], [address] Phone: [phone]   Fax:  [phone]  Occupational Therapy Treatment  Patient Details  Name: Kara [name] MRN: [id number] Date of Birth: [birth year] Referring Provider: Mack Guise  Encounter Date: 02/19/2016      OT End of Session - 02/19/16 1203    Visit Number 14   Number of Visits 20   Date for OT Re-Evaluation 03/12/16   OT Start Time 1135   OT Stop Time 1225   OT Time Calculation (min) 50 min   Activity Tolerance Patient tolerated treatment well   Behavior During Therapy [facility] for tasks assessed/performed      Past Medical History:  Diagnosis Date  . Allergy   . Atrial fibrillation ([place])   . CAD (coronary artery disease)   . Cancer (HCC)    UTERINE  . Chronic anticoagulation   . Chronic diarrhea   . Decubitus ulcer    sacral region  . Diabetes ([place])    diet controlled  . Dysuria   . Edema of lower extremity    mainly right foot, slightly in left foot.  . Fibrocystic breast disease   . GERD (gastroesophageal reflux disease)   . Glaucoma   . Glaucoma   . Hematuria   . Hemorrhoids   . History of colon polyps   . History of pancreatitis   . Hyperlipidemia   . Hypertension   . Hypokalemia   . IBS (irritable bowel syndrome)   . Microscopic hematuria   . Mitral valve regurgitation   . Osteoarthritis    fingers  . Pernicious anemia   . Plantar fasciitis   . Recurrent UTI   . Skin cancer   . Sleep apnea, obstructive   . Vaginal atrophy   . Vitamin D deficiency   . Yeast vaginitis     Past Surgical History:  Procedure Laterality Date  . ABDOMINAL HYSTERECTOMY  1980's  . ABDOMINAL SURGERY     for villous polyp,,,many years ago  . APPENDECTOMY  1940's  . ASCAD, s/p PTCA  11/28/2005   MID LESION   . BREAST BIOPSY Left 1970's  . CARPAL TUNNEL RELEASE Right 12/13/2015   Procedure: CARPAL TUNNEL RELEASE;  Surgeon: Thornton Park, MD;   Location: [facility] ORS;  Service: Orthopedics;  Laterality: Right;  . COLECTOMY    . PACEMAKER PLACEMENT    . radation     for uterine cance  . REFRACTIVE SURGERY     for bilateral glaumoma  . TONSILLECTOMY  1936    There were no vitals filed for this visit.      Subjective Assessment - 02/19/16 1450    Subjective  Doing okay - not feeling those pains anymore in my hand and wrist - but still pull over my thumb to scar and tingling in my fingers - feels like the same than before surgery    Patient Stated Goals I want my hand better - like it was before - not to drop objects , pick up small objects    Currently in Pain? Yes   Pain Score 1    Pain Location Hand   Pain Orientation Right   Pain Descriptors / Indicators Tingling   Pain Type Surgical pain                      OT Treatments/Exercises (OP) - 02/19/16 0001      RUE Paraffin   Number  Minutes Paraffin 10 Minutes   RUE Paraffin Location Hand   Comments at [facility] to decrease scar tissue and pain       PRWHE for pain  6/50; function 18/50 ( function worse than taken last time - but because of pins and needles )   Paraffin to R hand prior to manual therapy  Vibration for soft tissue mobs and scar mobs over CT scar and thenar eminence -to decrease scar tissue, Focus on proximal adhesion - moving better and less tenderness - CT spreads done 10 x  Composite extention stretch of digits and wrist done by OT   Light blue putty gripping, 3 point  And Lat grip- needed min A for lat grip  12 reps  AROM for PA and RA of thumb - hard time for PA - pt to do place and hold AROm Rubber band for resistance for RA 10 reps only  [place] at 3.3MHZ for 1.0 intensity , 20% For 5 min over CT scar and thenar eminence at end of session to decrease pain              OT Education - 02/19/16 1451    Education provided Yes   Education Details Findings of reassessment and HEP   Person(s) Educated Patient   Methods  Explanation;Demonstration;Tactile cues;Verbal cues   Comprehension Returned demonstration;Verbalized understanding;Verbal cues required          OT Short Term Goals - 01/30/16 1102      OT SHORT TERM GOAL #1   Title Pt to be independent in HEP to increase ROM, functional use , and strength   Baseline grip and 3 point grip improve, functional use in crease on PRWHE , ROM    Status Achieved     OT SHORT TERM GOAL #2   Title AROM in digits and wrist improve to WNL to write, bathing and dressing with no issues   Baseline still trouble with writing and buttons because of sensation issues   Time 3   Period Weeks   Status On-going           OT Long Term Goals - 01/30/16 1103      OT LONG TERM GOAL #1   Title R Grip strength improve to at least 50% compare to L to turn doorknob , carry 5 lbs    Baseline Grip 14 R , 25 on R - can carry 10 lbs with not problem   Status Achieved     OT LONG TERM GOAL #2   Title Function on PRWHE improve with at least 20 points    Baseline Function at Eval 28.5/50  and now 12.5/50   Time 6   Period Weeks   Status On-going     OT LONG TERM GOAL #3   Title Pt report improvement in sensory issues - to tell textures, write, do buttons and play cards    Baseline Pt report pins and needles and numb feeling thumb thru 3rd    Time 6   Period Weeks   Status On-going               Plan - 02/19/16 1204    Clinical Impression Statement Pt report still having trouble with carrying tray , doing buttons and jelewery , using bathroom paper, cutting, writing - but most of them because of sensory issues - pt do show atrophy at thenar eminence with decrease AROM and strength in PA - some pulling and tightness with scar tissue  Rehab Potential Good   OT Frequency 2x / week   OT Duration 4 weeks   OT Treatment/Interventions Self-care/ADL training;Cryotherapy;Ultrasound;Fluidtherapy;Patient/family education;Therapeutic exercises;Contrast Bath;Scar  mobilization;Passive range of motion;Manual Therapy   OT Home Exercise Plan see pt instruction    Consulted and Agree with Plan of Care Patient      Patient will benefit from skilled therapeutic intervention in order to improve the following deficits and impairments:  Decreased coordination, Decreased range of motion, Impaired flexibility, Impaired sensation, Decreased knowledge of precautions, Pain, Impaired UE functional use, Decreased scar mobility, Decreased strength  Visit Diagnosis: Stiffness of right hand, not elsewhere classified  Other disturbances of skin sensation  Muscle weakness (generalized)    Problem List Patient Active Problem List   Diagnosis Date Noted  . Urinary frequency 12/27/2015  . Atrophic vaginitis 09/26/2015  . Umbilical hernia without obstruction and without gangrene 06/26/2015  . Elevated blood sugar 05/29/2015  . Carpal tunnel syndrome of right wrist 05/29/2015  . Chronic fatigue 05/01/2015  . Left leg pain 05/01/2015  . Closed fracture of part of upper end of humerus 01/29/2015  . Skin lesion 01/29/2015  . Recurrent UTI 11/21/2014  . Vaginal atrophy 11/21/2014  . Urethral caruncle 11/21/2014  . Mitral valve regurgitation 05/09/2014  . History of pancreatitis 03/09/2014  . Medicare annual wellness visit, subsequent 10/06/2012  . Hemorrhoids 06/08/2012  . Osteoarthritis 03/08/2012  . Screening for breast cancer 03/08/2012  . Pernicious anemia 02/04/2012  . History of colectomy 02/04/2012  . Vitamin D deficiency 07/30/2011  . Chronic anticoagulation 06/13/2011  . Atrial fibrillation ([place]) 06/13/2011  . CAD (coronary artery disease) 06/13/2011  . Chronic diarrhea 06/13/2011    Rosalyn Gess OTR/L,CLT 02/19/2016, 2:59 PM  [place] [facility] [address], [place], [address] Phone: [phone]   Fax:  [phone]  Name: HEWAN STUHL MRN: [id number] Date of Birth:  [birth year]

## 2016-02-19 NOTE — Patient Instructions (Addendum)
Same HEP  

## 2016-02-22 ENCOUNTER — Ambulatory Visit: Payer: Medicare Other | Admitting: Occupational Therapy

## 2016-02-22 DIAGNOSIS — M6281 Muscle weakness (generalized): Secondary | ICD-10-CM | POA: Diagnosis not present

## 2016-02-22 DIAGNOSIS — R208 Other disturbances of skin sensation: Secondary | ICD-10-CM | POA: Diagnosis not present

## 2016-02-22 DIAGNOSIS — M25641 Stiffness of right hand, not elsewhere classified: Secondary | ICD-10-CM

## 2016-02-22 NOTE — Therapy (Signed)
[place] [facility] [address] [place], [address] Phone: [phone]   Fax:  [phone]  Occupational Therapy Treatment  Patient Details  Name: Kara [name] MRN: [id number] Date of Birth: 12-08-1922 Referring Provider: Mack Guise  Encounter Date: 02/22/2016      OT End of Session - 02/22/16 1327    Visit Number 15   Number of Visits 20   Date for OT Re-Evaluation 03/12/16   OT Start Time 1105   OT Stop Time 1155   OT Time Calculation (min) 50 min   Activity Tolerance Patient tolerated treatment well   Behavior During Therapy [facility] for tasks assessed/performed      Past Medical History:  Diagnosis Date  . Allergy   . Atrial fibrillation ([place])   . CAD (coronary artery disease)   . Cancer (HCC)    UTERINE  . Chronic anticoagulation   . Chronic diarrhea   . Decubitus ulcer    sacral region  . Diabetes ([place])    diet controlled  . Dysuria   . Edema of lower extremity    mainly right foot, slightly in left foot.  . Fibrocystic breast disease   . GERD (gastroesophageal reflux disease)   . Glaucoma   . Glaucoma   . Hematuria   . Hemorrhoids   . History of colon polyps   . History of pancreatitis   . Hyperlipidemia   . Hypertension   . Hypokalemia   . IBS (irritable bowel syndrome)   . Microscopic hematuria   . Mitral valve regurgitation   . Osteoarthritis    fingers  . Pernicious anemia   . Plantar fasciitis   . Recurrent UTI   . Skin cancer   . Sleep apnea, obstructive   . Vaginal atrophy   . Vitamin D deficiency   . Yeast vaginitis     Past Surgical History:  Procedure Laterality Date  . ABDOMINAL HYSTERECTOMY  1980's  . ABDOMINAL SURGERY     for villous polyp,,,many years ago  . APPENDECTOMY  1940's  . ASCAD, s/p PTCA  11/28/2005   MID LESION   . BREAST BIOPSY Left 1970's  . CARPAL TUNNEL RELEASE Right 12/13/2015   Procedure: CARPAL TUNNEL RELEASE;  Surgeon: Thornton Park, MD;   Location: [facility] ORS;  Service: Orthopedics;  Laterality: Right;  . COLECTOMY    . PACEMAKER PLACEMENT    . radation     for uterine cance  . REFRACTIVE SURGERY     for bilateral glaumoma  . TONSILLECTOMY  1936    There were no vitals filed for this visit.      Subjective Assessment - 02/22/16 1110    Subjective  Was hurting some what after I left last time - but still feeling pull on side of scar - and can you go over the exercises for my thumb - scar and tenderness at base of scar better    Patient Stated Goals I want my hand better - like it was before - not to drop objects , pick up small objects    Currently in Pain? No/denies                      OT Treatments/Exercises (OP) - 02/22/16 0001      RUE Fluidotherapy   Number Minutes Fluidotherapy 10 Minutes   RUE Fluidotherapy Location Hand;Wrist   Comments Tendon glides and wrist AROM at [facility] to decrease pain  Fluido therapy  Graston tool nr 2 and 4 to do brushing and sweeping - after heat - prior to ROM - done in the middle of scar , proximal - to decrease scar tissue,   moving better and less tenderness - CT spreads done 10 x  Composite extention stretch of digits and wrist done by OT   Light blue putty gripping, 3 point And Lat grip-  AROM for PA and RA of thumb - hard time for PA  Rubber band for resistance for RA and PA this date - place and hold for RA 10 reps only Redone HEP this date for strengthening - pt ask to review   [place] at 3.3MHZ for 1.0 intensity , 20% For 5 min over CT scar and thenar eminence at end of session to decrease pain             OT Education - 02/22/16 1326    Education provided Yes   Education Details reed on HEP with new hand out    Person(s) Educated Patient   Methods Explanation;Demonstration;Tactile cues;Verbal cues;Handout   Comprehension Returned demonstration;Verbalized understanding;Verbal cues required          OT Short Term Goals  - 01/30/16 1102      OT SHORT TERM GOAL #1   Title Pt to be independent in HEP to increase ROM, functional use , and strength   Baseline grip and 3 point grip improve, functional use in crease on PRWHE , ROM    Status Achieved     OT SHORT TERM GOAL #2   Title AROM in digits and wrist improve to WNL to write, bathing and dressing with no issues   Baseline still trouble with writing and buttons because of sensation issues   Time 3   Period Weeks   Status On-going           OT Long Term Goals - 01/30/16 1103      OT LONG TERM GOAL #1   Title R Grip strength improve to at least 50% compare to L to turn doorknob , carry 5 lbs    Baseline Grip 14 R , 25 on R - can carry 10 lbs with not problem   Status Achieved     OT LONG TERM GOAL #2   Title Function on PRWHE improve with at least 20 points    Baseline Function at Eval 28.5/50  and now 12.5/50   Time 6   Period Weeks   Status On-going     OT LONG TERM GOAL #3   Title Pt report improvement in sensory issues - to tell textures, write, do buttons and play cards    Baseline Pt report pins and needles and numb feeling thumb thru 3rd    Time 6   Period Weeks   Status On-going               Plan - 02/22/16 1328    Clinical Impression Statement Pt scar tissue proximal not as tender but cont to have some pain - decrease AROM and strength in thumb PA - redone pt's home program to do correctly    Rehab Potential Good   OT Frequency 2x / week   OT Duration 4 weeks   OT Treatment/Interventions Self-care/ADL training;Cryotherapy;Ultrasound;Fluidtherapy;Patient/family education;Therapeutic exercises;Contrast Bath;Scar mobilization;Passive range of motion;Manual Therapy   Plan Grip assess and cont scar tissue in middle    OT Home Exercise Plan see pt instruction    Consulted and Agree with Plan  of Care Patient      Patient will benefit from skilled therapeutic intervention in order to improve the following deficits and  impairments:  Decreased coordination, Decreased range of motion, Impaired flexibility, Impaired sensation, Decreased knowledge of precautions, Pain, Impaired UE functional use, Decreased scar mobility, Decreased strength  Visit Diagnosis: Stiffness of right hand, not elsewhere classified  Other disturbances of skin sensation  Muscle weakness (generalized)    Problem List Patient Active Problem List   Diagnosis Date Noted  . Urinary frequency 12/27/2015  . Atrophic vaginitis 09/26/2015  . Umbilical hernia without obstruction and without gangrene 06/26/2015  . Elevated blood sugar 05/29/2015  . Carpal tunnel syndrome of right wrist 05/29/2015  . Chronic fatigue 05/01/2015  . Left leg pain 05/01/2015  . Closed fracture of part of upper end of humerus 01/29/2015  . Skin lesion 01/29/2015  . Recurrent UTI 11/21/2014  . Vaginal atrophy 11/21/2014  . Urethral caruncle 11/21/2014  . Mitral valve regurgitation 05/09/2014  . History of pancreatitis 03/09/2014  . Medicare annual wellness visit, subsequent 10/06/2012  . Hemorrhoids 06/08/2012  . Osteoarthritis 03/08/2012  . Screening for breast cancer 03/08/2012  . Pernicious anemia 02/04/2012  . History of colectomy 02/04/2012  . Vitamin D deficiency 07/30/2011  . Chronic anticoagulation 06/13/2011  . Atrial fibrillation ([place]) 06/13/2011  . CAD (coronary artery disease) 06/13/2011  . Chronic diarrhea 06/13/2011    Rosalyn Gess OTR/L,CLT 02/22/2016, 1:43 PM  [place] [facility] [address], [place], [address] Phone: [phone]   Fax:  [phone]  Name: Kara [name] MRN: [id number] Date of Birth: 07-07-1922

## 2016-02-22 NOTE — Patient Instructions (Addendum)
Fluido therapy  Graston tool nr 2 and 4 to do brushing and sweeping - after heat - prior to ROM - done in the middle of scar , proximal - to decrease scar tissue,   moving better and less tenderness - CT spreads done 10 x  Composite extention stretch of digits and wrist done by OT   Light blue putty gripping, 3 point And Lat grip-  AROM for PA and RA of thumb - hard time for PA  Rubber band for resistance for RA and PA this date - place and hold for RA 10 reps only Redone HEP this date for strengthening - pt ask to review   [place] at 3.3MHZ for 1.0 intensity , 20% For 5 min over CT scar and thenar eminence at end of session to decrease pain

## 2016-02-26 ENCOUNTER — Ambulatory Visit: Payer: Medicare Other | Admitting: Occupational Therapy

## 2016-02-26 DIAGNOSIS — M25641 Stiffness of right hand, not elsewhere classified: Secondary | ICD-10-CM

## 2016-02-26 DIAGNOSIS — M6281 Muscle weakness (generalized): Secondary | ICD-10-CM

## 2016-02-26 DIAGNOSIS — R208 Other disturbances of skin sensation: Secondary | ICD-10-CM | POA: Diagnosis not present

## 2016-02-26 NOTE — Therapy (Signed)
[facility] [address] [place], [address] Phone: [phone]   Fax:  [phone]  Occupational Therapy Treatment  Patient Details  Name: Kara [name] MRN: [id number] Date of Birth: 11-01-1922 Referring Provider: Mack Guise  Encounter Date: 02/26/2016      OT End of Session - 02/26/16 1238    Visit Number 16   Number of Visits 20   Date for OT Re-Evaluation 03/12/16   OT Start Time 1228   OT Stop Time 1302   OT Time Calculation (min) 34 min   Activity Tolerance Patient tolerated treatment well   Behavior During Therapy [facility] for tasks assessed/performed      Past Medical History:  Diagnosis Date  . Allergy   . Atrial fibrillation ([place])   . CAD (coronary artery disease)   . Cancer (HCC)    UTERINE  . Chronic anticoagulation   . Chronic diarrhea   . Decubitus ulcer    sacral region  . Diabetes ([place])    diet controlled  . Dysuria   . Edema of lower extremity    mainly right foot, slightly in left foot.  . Fibrocystic breast disease   . GERD (gastroesophageal reflux disease)   . Glaucoma   . Glaucoma   . Hematuria   . Hemorrhoids   . History of colon polyps   . History of pancreatitis   . Hyperlipidemia   . Hypertension   . Hypokalemia   . IBS (irritable bowel syndrome)   . Microscopic hematuria   . Mitral valve regurgitation   . Osteoarthritis    fingers  . Pernicious anemia   . Plantar fasciitis   . Recurrent UTI   . Skin cancer   . Sleep apnea, obstructive   . Vaginal atrophy   . Vitamin D deficiency   . Yeast vaginitis     Past Surgical History:  Procedure Laterality Date  . ABDOMINAL HYSTERECTOMY  1980's  . ABDOMINAL SURGERY     for villous polyp,,,many years ago  . APPENDECTOMY  1940's  . ASCAD, s/p PTCA  11/28/2005   MID LESION   . BREAST BIOPSY Left 1970's  . CARPAL TUNNEL RELEASE Right 12/13/2015   Procedure: CARPAL TUNNEL RELEASE;  Surgeon: Thornton Park, MD;   Location: [facility] ORS;  Service: Orthopedics;  Laterality: Right;  . COLECTOMY    . PACEMAKER PLACEMENT    . radation     for uterine cance  . REFRACTIVE SURGERY     for bilateral glaumoma  . TONSILLECTOMY  1936    There were no vitals filed for this visit.      Subjective Assessment - 02/26/16 1234    Subjective  Was little sore after last time - but doing better - slow but steady - scar tissue still feels like it is one the side of the scar - pulling when opening thumb    Patient Stated Goals I want my hand better - like it was before - not to drop objects , pick up small objects    Currently in Pain? Yes   Pain Score 1    Pain Location Hand   Pain Orientation Right   Pain Descriptors / Indicators Tingling   Pain Type Surgical pain                      OT Treatments/Exercises (OP) - 02/26/16 0001      RUE Fluidotherapy   Number Minutes Fluidotherapy  10 Minutes   RUE Fluidotherapy Location Hand;Wrist   Comments tendon glides , wrist AROM at [place] therapy  [place]aston tool nr 2 and 4 to do brushing and sweeping - after heat - prior to ROM - done in the middle of scar , proximal - to decrease scar tissue,   moving better and less tenderness - CT spreads done 10 x  Composite extention stretch of digits and wrist done by OT      [place] at 3.3MHZ for 1.0 intensity , 20% For 5 min over CT scar and thenar eminence at end of session to decrease pain             OT Education - 02/26/16 1238    Education provided Yes   Education Details HEP   Person(s) Educated Patient   Methods Explanation;Demonstration;Tactile cues;Verbal cues   Comprehension Verbal cues required;Returned demonstration;Verbalized understanding          OT Short Term Goals - 01/30/16 1102      OT SHORT TERM GOAL #1   Title Pt to be independent in HEP to increase ROM, functional use , and strength   Baseline grip and 3 point grip improve, functional use in crease  on PRWHE , ROM    Status Achieved     OT SHORT TERM GOAL #2   Title AROM in digits and wrist improve to WNL to write, bathing and dressing with no issues   Baseline still trouble with writing and buttons because of sensation issues   Time 3   Period Weeks   Status On-going           OT Long Term Goals - 01/30/16 1103      OT LONG TERM GOAL #1   Title R Grip strength improve to at least 50% compare to L to turn doorknob , carry 5 lbs    Baseline Grip 14 R , 25 on R - can carry 10 lbs with not problem   Status Achieved     OT LONG TERM GOAL #2   Title Function on PRWHE improve with at least 20 points    Baseline Function at Eval 28.5/50  and now 12.5/50   Time 6   Period Weeks   Status On-going     OT LONG TERM GOAL #3   Title Pt report improvement in sensory issues - to tell textures, write, do buttons and play cards    Baseline Pt report pins and needles and numb feeling thumb thru 3rd    Time 6   Period Weeks   Status On-going               Plan - 02/26/16 1239    Clinical Impression Statement Scar tissue feels better in the middle than last session - pt report know what to do for HEP - feels like scar getting better and tightness - still feel pain shooting at times and when pushing down on palm feels pain - ed pt that pressure still will hurt pushing thru palm  -    Rehab Potential Good   OT Frequency 2x / week   OT Duration 4 weeks   OT Treatment/Interventions Self-care/ADL training;Cryotherapy;Ultrasound;Fluidtherapy;Patient/family education;Therapeutic exercises;Contrast Bath;Scar mobilization;Passive range of motion;Manual Therapy   Plan assess grip    OT Home Exercise Plan see pt instruction    Consulted and Agree with Plan of Care Patient      Patient will benefit from skilled therapeutic intervention  in order to improve the following deficits and impairments:  Decreased coordination, Decreased range of motion, Impaired flexibility, Impaired sensation,  Decreased knowledge of precautions, Pain, Impaired UE functional use, Decreased scar mobility, Decreased strength  Visit Diagnosis: Stiffness of right hand, not elsewhere classified  Other disturbances of skin sensation  Muscle weakness (generalized)    Problem List Patient Active Problem List   Diagnosis Date Noted  . Urinary frequency 12/27/2015  . Atrophic vaginitis 09/26/2015  . Umbilical hernia without obstruction and without gangrene 06/26/2015  . Elevated blood sugar 05/29/2015  . Carpal tunnel syndrome of right wrist 05/29/2015  . Chronic fatigue 05/01/2015  . Left leg pain 05/01/2015  . Closed fracture of part of upper end of humerus 01/29/2015  . Skin lesion 01/29/2015  . Recurrent UTI 11/21/2014  . Vaginal atrophy 11/21/2014  . Urethral caruncle 11/21/2014  . Mitral valve regurgitation 05/09/2014  . History of pancreatitis 03/09/2014  . Medicare annual wellness visit, subsequent 10/06/2012  . Hemorrhoids 06/08/2012  . Osteoarthritis 03/08/2012  . Screening for breast cancer 03/08/2012  . Pernicious anemia 02/04/2012  . History of colectomy 02/04/2012  . Vitamin D deficiency 07/30/2011  . Chronic anticoagulation 06/13/2011  . Atrial fibrillation ([place]) 06/13/2011  . CAD (coronary artery disease) 06/13/2011  . Chronic diarrhea 06/13/2011    Rosalyn Gess 02/26/2016, 2:53 PM  [facility] [address], [place], [address] Phone: [phone]   Fax:  [phone]  Name: Kara [name] MRN: [id number] Date of Birth: [birth year]

## 2016-02-26 NOTE — Patient Instructions (Addendum)
Same HEP as last time  

## 2016-02-27 ENCOUNTER — Telehealth: Payer: Self-pay

## 2016-02-27 NOTE — Telephone Encounter (Signed)
Spoken to patient about INR results from [facility]  Levels where out of range. Mable Paris FNP stated patient would need to repeat INR.  Patient stated she will and that its low due to forgetting to take medication.

## 2016-02-29 ENCOUNTER — Ambulatory Visit: Payer: Medicare Other | Admitting: Occupational Therapy

## 2016-02-29 DIAGNOSIS — M25641 Stiffness of right hand, not elsewhere classified: Secondary | ICD-10-CM | POA: Diagnosis not present

## 2016-02-29 DIAGNOSIS — M6281 Muscle weakness (generalized): Secondary | ICD-10-CM | POA: Diagnosis not present

## 2016-02-29 DIAGNOSIS — R208 Other disturbances of skin sensation: Secondary | ICD-10-CM

## 2016-02-29 NOTE — Therapy (Signed)
[facility] [address], [place], [address] Phone: [phone]   Fax:  [phone]  Occupational Therapy Treatment  Patient Details  Name: Kara [name] MRN: [id number] Date of Birth: 09/29/1922 Referring Provider: Mack Guise  Encounter Date: 02/29/2016      OT End of Session - 02/29/16 1427    Visit Number 17   Number of Visits 20   Date for OT Re-Evaluation 03/12/16   OT Start Time 1036   OT Stop Time 1114   OT Time Calculation (min) 38 min   Activity Tolerance Patient tolerated treatment well   Behavior During Therapy [facility] for tasks assessed/performed      Past Medical History:  Diagnosis Date  . Allergy   . Atrial fibrillation ([place])   . CAD (coronary artery disease)   . Cancer (HCC)    UTERINE  . Chronic anticoagulation   . Chronic diarrhea   . Decubitus ulcer    sacral region  . Diabetes ([place])    diet controlled  . Dysuria   . Edema of lower extremity    mainly right foot, slightly in left foot.  . Fibrocystic breast disease   . GERD (gastroesophageal reflux disease)   . Glaucoma   . Glaucoma   . Hematuria   . Hemorrhoids   . History of colon polyps   . History of pancreatitis   . Hyperlipidemia   . Hypertension   . Hypokalemia   . IBS (irritable bowel syndrome)   . Microscopic hematuria   . Mitral valve regurgitation   . Osteoarthritis    fingers  . Pernicious anemia   . Plantar fasciitis   . Recurrent UTI   . Skin cancer   . Sleep apnea, obstructive   . Vaginal atrophy   . Vitamin D deficiency   . Yeast vaginitis     Past Surgical History:  Procedure Laterality Date  . ABDOMINAL HYSTERECTOMY  1980's  . ABDOMINAL SURGERY     for villous polyp,,,many years ago  . APPENDECTOMY  1940's  . ASCAD, s/p PTCA  11/28/2005   MID LESION   . BREAST BIOPSY Left 1970's  . CARPAL TUNNEL RELEASE Right 12/13/2015   Procedure: CARPAL TUNNEL RELEASE;  Surgeon: Thornton Park, MD;   Location: [facility] ORS;  Service: Orthopedics;  Laterality: Right;  . COLECTOMY    . PACEMAKER PLACEMENT    . radation     for uterine cance  . REFRACTIVE SURGERY     for bilateral glaumoma  . TONSILLECTOMY  1936    There were no vitals filed for this visit.      Subjective Assessment - 02/29/16 1038    Subjective  It is better- do not hurt like it did - except if I mash on it her on side of scar   Patient Stated Goals I want my hand better - like it was before - not to drop objects , pick up small objects    Currently in Pain? No/denies            [facility] OT Assessment - 02/29/16 0001      Strength   Right Hand Grip (lbs) 20   Right Hand Lateral Pinch 9 lbs   Right Hand 3 Point Pinch 5 lbs   Left Hand Grip (lbs) 26   Left Hand Lateral Pinch 11 lbs   Left Hand 3 Point Pinch 10 lbs      Grip and prehension strength assess -  see flow sheet  Fluido therapy done to R hand  Vibration done on radial side of scar - - after heat  - to decrease scar tissue,  moving better and less tenderness - and scar tissue afterwards per pt  CT spreads done 10 x  Composite extention stretch of digits and wrist done by OT   Add  1/2 teal putty to light blue to increase resistance - teal alone pt had pain in 3rd digit   but  With 1/2 added to increase resistance some - pt could do grip and lat/3 point with only minimal discomfort at 3rd  10 reps     [place] at 3.3MHZ for 1.0 intensity , 20% For 5 min over CT scar and thenar eminence at end of session to decrease pain               OT Treatments/Exercises (OP) - 02/29/16 0001      RUE Fluidotherapy   Number Minutes Fluidotherapy 10 Minutes   RUE Fluidotherapy Location Hand;Wrist   Comments tendon glides                 OT Education - 02/29/16 1426    Education provided Yes   Education Details HEP   Person(s) Educated Patient   Methods Explanation;Demonstration;Tactile cues;Verbal cues   Comprehension  Returned demonstration;Verbalized understanding          OT Short Term Goals - 01/30/16 1102      OT SHORT TERM GOAL #1   Title Pt to be independent in HEP to increase ROM, functional use , and strength   Baseline grip and 3 point grip improve, functional use in crease on PRWHE , ROM    Status Achieved     OT SHORT TERM GOAL #2   Title AROM in digits and wrist improve to WNL to write, bathing and dressing with no issues   Baseline still trouble with writing and buttons because of sensation issues   Time 3   Period Weeks   Status On-going           OT Long Term Goals - 01/30/16 1103      OT LONG TERM GOAL #1   Title R Grip strength improve to at least 50% compare to L to turn doorknob , carry 5 lbs    Baseline Grip 14 R , 25 on R - can carry 10 lbs with not problem   Status Achieved     OT LONG TERM GOAL #2   Title Function on PRWHE improve with at least 20 points    Baseline Function at Eval 28.5/50  and now 12.5/50   Time 6   Period Weeks   Status On-going     OT LONG TERM GOAL #3   Title Pt report improvement in sensory issues - to tell textures, write, do buttons and play cards    Baseline Pt report pins and needles and numb feeling thumb thru 3rd    Time 6   Period Weeks   Status On-going               Plan - 02/29/16 1427    Clinical Impression Statement Pt's scar tissue improving - and pain - but still numbness /pins and needles present - grip and prehension was the same than 2 wks ago - increase putty resistance   Rehab Potential Good   OT Frequency 2x / week   OT Duration 2 weeks   OT Treatment/Interventions Self-care/ADL training;Cryotherapy;Ultrasound;Fluidtherapy;Patient/family education;Therapeutic  exercises;Contrast Bath;Scar mobilization;Passive range of motion;Manual Therapy   Plan assess if no increase pain with upgrade on resistance    OT Home Exercise Plan see pt instruction    Consulted and Agree with Plan of Care Patient       Patient will benefit from skilled therapeutic intervention in order to improve the following deficits and impairments:  Decreased coordination, Decreased range of motion, Impaired flexibility, Impaired sensation, Decreased knowledge of precautions, Pain, Impaired UE functional use, Decreased scar mobility, Decreased strength  Visit Diagnosis: Stiffness of right hand, not elsewhere classified  Other disturbances of skin sensation  Muscle weakness (generalized)    Problem List Patient Active Problem List   Diagnosis Date Noted  . Urinary frequency 12/27/2015  . Atrophic vaginitis 09/26/2015  . Umbilical hernia without obstruction and without gangrene 06/26/2015  . Elevated blood sugar 05/29/2015  . Carpal tunnel syndrome of right wrist 05/29/2015  . Chronic fatigue 05/01/2015  . Left leg pain 05/01/2015  . Closed fracture of part of upper end of humerus 01/29/2015  . Skin lesion 01/29/2015  . Recurrent UTI 11/21/2014  . Vaginal atrophy 11/21/2014  . Urethral caruncle 11/21/2014  . Mitral valve regurgitation 05/09/2014  . History of pancreatitis 03/09/2014  . Medicare annual wellness visit, subsequent 10/06/2012  . Hemorrhoids 06/08/2012  . Osteoarthritis 03/08/2012  . Screening for breast cancer 03/08/2012  . Pernicious anemia 02/04/2012  . History of colectomy 02/04/2012  . Vitamin D deficiency 07/30/2011  . Chronic anticoagulation 06/13/2011  . Atrial fibrillation ([place]) 06/13/2011  . CAD (coronary artery disease) 06/13/2011  . Chronic diarrhea 06/13/2011    Rosalyn Gess OTR/L,CLT  02/29/2016, 2:29 PM  [facility] [address] [place], [address] Phone: [phone]   Fax:  [phone]  Name: Kara [name] MRN: [id number] Date of Birth: 1922/06/01

## 2016-02-29 NOTE — Patient Instructions (Signed)
Same HEP but Add  1/2 teal putty to light blue to increase resistance -- pt could do grip and lat/3 point with only minimal discomfort at 3rd  10 reps

## 2016-03-03 ENCOUNTER — Ambulatory Visit: Payer: Medicare Other | Admitting: Occupational Therapy

## 2016-03-03 DIAGNOSIS — M25641 Stiffness of right hand, not elsewhere classified: Secondary | ICD-10-CM

## 2016-03-03 DIAGNOSIS — M6281 Muscle weakness (generalized): Secondary | ICD-10-CM

## 2016-03-03 DIAGNOSIS — R208 Other disturbances of skin sensation: Secondary | ICD-10-CM | POA: Diagnosis not present

## 2016-03-03 NOTE — Patient Instructions (Signed)
Same HEP  

## 2016-03-03 NOTE — Therapy (Signed)
[facility] [address] [place], [address] Phone: [phone]   Fax:  [phone]  Occupational Therapy Treatment  Patient Details  Name: Kara [name] MRN: [id number] Date of Birth: 03-30-1923 Referring Provider: Mack Guise  Encounter Date: 03/03/2016      OT End of Session - 03/03/16 1621    Visit Number 18   Number of Visits 20   Date for OT Re-Evaluation 03/12/16   OT Start Time 1359   OT Stop Time 1444   OT Time Calculation (min) 45 min   Activity Tolerance Patient tolerated treatment well   Behavior During Therapy [facility] for tasks assessed/performed      Past Medical History:  Diagnosis Date  . Allergy   . Atrial fibrillation ([place])   . CAD (coronary artery disease)   . Cancer (HCC)    UTERINE  . Chronic anticoagulation   . Chronic diarrhea   . Decubitus ulcer    sacral region  . Diabetes ([place])    diet controlled  . Dysuria   . Edema of lower extremity    mainly right foot, slightly in left foot.  . Fibrocystic breast disease   . GERD (gastroesophageal reflux disease)   . Glaucoma   . Glaucoma   . Hematuria   . Hemorrhoids   . History of colon polyps   . History of pancreatitis   . Hyperlipidemia   . Hypertension   . Hypokalemia   . IBS (irritable bowel syndrome)   . Microscopic hematuria   . Mitral valve regurgitation   . Osteoarthritis    fingers  . Pernicious anemia   . Plantar fasciitis   . Recurrent UTI   . Skin cancer   . Sleep apnea, obstructive   . Vaginal atrophy   . Vitamin D deficiency   . Yeast vaginitis     Past Surgical History:  Procedure Laterality Date  . ABDOMINAL HYSTERECTOMY  1980's  . ABDOMINAL SURGERY     for villous polyp,,,many years ago  . APPENDECTOMY  1940's  . ASCAD, s/p PTCA  11/28/2005   MID LESION   . BREAST BIOPSY Left 1970's  . CARPAL TUNNEL RELEASE Right 12/13/2015   Procedure: CARPAL TUNNEL RELEASE;  Surgeon: Thornton Park, MD;   Location: [facility] ORS;  Service: Orthopedics;  Laterality: Right;  . COLECTOMY    . PACEMAKER PLACEMENT    . radation     for uterine cance  . REFRACTIVE SURGERY     for bilateral glaumoma  . TONSILLECTOMY  1936    There were no vitals filed for this visit.      Subjective Assessment - 03/03/16 1617    Subjective  Had little more pain since last time - but do not stay , comes and goes    Patient Stated Goals I want my hand better - like it was before - not to drop objects , pick up small objects                       OT Treatments/Exercises (OP) - 03/03/16 0001      RUE Fluidotherapy   Number Minutes Fluidotherapy 10 Minutes   RUE Fluidotherapy Location Hand;Wrist   Comments Tendon glides and wrist AROM at [facility] to decrease pain       Fluido therapy done to R hand at [facility]  Vibration done on radial side of scar - - after heat  - to decrease scar  tissue,  moving better and less tenderness - and scar tissue afterwards per pt  CT spreads done 10 x  Composite extention stretch of digits and wrist done by OT    Reviewed HEP with pt for  1/2 teal putty mix with  light blue to increase resistance - to not grip tight with 3rd     also done Lat/3 point with only minimal discomfort at 3rd - stop prior to pain  Needed min A  Rubber band for PA and RA - still needed min A to do PA correctly  10 reps     [place] at 3.3MHZ for 1.0 intensity , 20% For 5 min over CT scar and thenar eminence at end of session to [place] - 03/03/16 1620    Education provided Yes   Education Details HEP reviewed    Person(s) Educated Patient   Methods Explanation;Demonstration;Tactile cues;Verbal cues   Comprehension Verbal cues required;Returned demonstration;Verbalized understanding          OT Short Term Goals - 01/30/16 1102      OT SHORT TERM GOAL #1   Title Pt to be independent in HEP to increase ROM, functional use , and strength   Baseline  grip and 3 point grip improve, functional use in crease on PRWHE , ROM    Status Achieved     OT SHORT TERM GOAL #2   Title AROM in digits and wrist improve to WNL to write, bathing and dressing with no issues   Baseline still trouble with writing and buttons because of sensation issues   Time 3   Period Weeks   Status On-going           OT Long Term Goals - 01/30/16 1103      OT LONG TERM GOAL #1   Title R Grip strength improve to at least 50% compare to L to turn doorknob , carry 5 lbs    Baseline Grip 14 R , 25 on R - can carry 10 lbs with not problem   Status Achieved     OT LONG TERM GOAL #2   Title Function on PRWHE improve with at least 20 points    Baseline Function at Eval 28.5/50  and now 12.5/50   Time 6   Period Weeks   Status On-going     OT LONG TERM GOAL #3   Title Pt report improvement in sensory issues - to tell textures, write, do buttons and play cards    Baseline Pt report pins and needles and numb feeling thumb thru 3rd    Time 6   Period Weeks   Status On-going               Plan - 03/03/16 1621    Clinical Impression Statement Pt cont to make progress in scar tissue - pt had some increase pain since last time but it was because we increased putty resistance - pt to see MD on Monday - will reassess grip next time    Rehab Potential Good   OT Frequency 2x / week   OT Duration 2 weeks   OT Treatment/Interventions Self-care/ADL training;Cryotherapy;Ultrasound;Fluidtherapy;Patient/family education;Therapeutic exercises;Contrast Bath;Scar mobilization;Passive range of motion;Manual Therapy   Plan reassess grip and reviewed discharge instruction    OT Home Exercise Plan see pt instruction    Consulted and Agree with Plan of Care Patient      Patient will benefit from skilled  therapeutic intervention in order to improve the following deficits and impairments:  Decreased coordination, Decreased range of motion, Impaired flexibility, Impaired  sensation, Decreased knowledge of precautions, Pain, Impaired UE functional use, Decreased scar mobility, Decreased strength  Visit Diagnosis: Stiffness of right hand, not elsewhere classified  Other disturbances of skin sensation  Muscle weakness (generalized)    Problem List Patient Active Problem List   Diagnosis Date Noted  . Urinary frequency 12/27/2015  . Atrophic vaginitis 09/26/2015  . Umbilical hernia without obstruction and without gangrene 06/26/2015  . Elevated blood sugar 05/29/2015  . Carpal tunnel syndrome of right wrist 05/29/2015  . Chronic fatigue 05/01/2015  . Left leg pain 05/01/2015  . Closed fracture of part of upper end of humerus 01/29/2015  . Skin lesion 01/29/2015  . Recurrent UTI 11/21/2014  . Vaginal atrophy 11/21/2014  . Urethral caruncle 11/21/2014  . Mitral valve regurgitation 05/09/2014  . History of pancreatitis 03/09/2014  . Medicare annual wellness visit, subsequent 10/06/2012  . Hemorrhoids 06/08/2012  . Osteoarthritis 03/08/2012  . Screening for breast cancer 03/08/2012  . Pernicious anemia 02/04/2012  . History of colectomy 02/04/2012  . Vitamin D deficiency 07/30/2011  . Chronic anticoagulation 06/13/2011  . Atrial fibrillation ([place]) 06/13/2011  . CAD (coronary artery disease) 06/13/2011  . Chronic diarrhea 06/13/2011    Rosalyn Gess OTR/L,CLT 03/03/2016, 4:23 PM  [facility] [address] [place], [address] Phone: [phone]   Fax:  [phone]  Name: CODI LANSDEN MRN: [id number] Date of Birth: February 04, 1923

## 2016-03-04 LAB — POCT INR: INR: 2.3 — AB (ref <=1.1)

## 2016-03-06 ENCOUNTER — Ambulatory Visit: Payer: Medicare Other | Admitting: Occupational Therapy

## 2016-03-06 DIAGNOSIS — R208 Other disturbances of skin sensation: Secondary | ICD-10-CM | POA: Diagnosis not present

## 2016-03-06 DIAGNOSIS — M25641 Stiffness of right hand, not elsewhere classified: Secondary | ICD-10-CM | POA: Diagnosis not present

## 2016-03-06 DIAGNOSIS — M6281 Muscle weakness (generalized): Secondary | ICD-10-CM | POA: Diagnosis not present

## 2016-03-06 NOTE — Patient Instructions (Signed)
Reviewed HEP with pt for 1/2 teal putty mix with  light blue to increase resistance - to not grip tight with 3rd    also done Lat/3 point with only minimal discomfort at 3rd - stop prior to pain  Needed min A  Rubber band for PA and RA - still needed min A to do PA correctly  10 reps

## 2016-03-06 NOTE — Therapy (Signed)
[facility] [address], [place], [address] Phone: [phone]   Fax:  [phone]  Occupational Therapy Treatment/discharge  Patient Details  Name: Kara [name] MRN: [id number] Date of Birth: 1922/05/23 Referring Provider: Mack Guise  Encounter Date: 03/06/2016      OT End of Session - 03/06/16 1332    Visit Number 19   Number of Visits 19   Date for OT Re-Evaluation 03/06/16   OT Start Time 0947   OT Stop Time 1031   OT Time Calculation (min) 44 min   Activity Tolerance Patient tolerated treatment well   Behavior During Therapy [facility] for tasks assessed/performed      Past Medical History:  Diagnosis Date  . Allergy   . Atrial fibrillation ([place])   . CAD (coronary artery disease)   . Cancer (HCC)    UTERINE  . Chronic anticoagulation   . Chronic diarrhea   . Decubitus ulcer    sacral region  . Diabetes ([place])    diet controlled  . Dysuria   . Edema of lower extremity    mainly right foot, slightly in left foot.  . Fibrocystic breast disease   . GERD (gastroesophageal reflux disease)   . Glaucoma   . Glaucoma   . Hematuria   . Hemorrhoids   . History of colon polyps   . History of pancreatitis   . Hyperlipidemia   . Hypertension   . Hypokalemia   . IBS (irritable bowel syndrome)   . Microscopic hematuria   . Mitral valve regurgitation   . Osteoarthritis    fingers  . Pernicious anemia   . Plantar fasciitis   . Recurrent UTI   . Skin cancer   . Sleep apnea, obstructive   . Vaginal atrophy   . Vitamin D deficiency   . Yeast vaginitis     Past Surgical History:  Procedure Laterality Date  . ABDOMINAL HYSTERECTOMY  1980's  . ABDOMINAL SURGERY     for villous polyp,,,many years ago  . APPENDECTOMY  1940's  . ASCAD, s/p PTCA  11/28/2005   MID LESION   . BREAST BIOPSY Left 1970's  . CARPAL TUNNEL RELEASE Right 12/13/2015   Procedure: CARPAL TUNNEL RELEASE;  Surgeon: Thornton Park, MD;  Location: [facility] ORS;  Service: Orthopedics;  Laterality: Right;  . COLECTOMY    . PACEMAKER PLACEMENT    . radation     for uterine cance  . REFRACTIVE SURGERY     for bilateral glaumoma  . TONSILLECTOMY  1936    There were no vitals filed for this visit.      Subjective Assessment - 03/06/16 0954    Subjective  I have list of questions for the Dr - this bone down, here , pain down at thumb , tight spot next to scar and in webspace -    Patient Stated Goals I want my hand better - like it was before - not to drop objects , pick up small objects    Currently in Pain? No/denies            [facility] OT Assessment - 03/06/16 0001      Strength   Right Hand Grip (lbs) 20   Right Hand Lateral Pinch 11 lbs   Right Hand 3 Point Pinch 7 lbs   Left Hand Grip (lbs) 26   Left Hand Lateral Pinch 11 lbs   Left Hand 3 Point Pinch 10 lbs  Right Hand AROM   R Thumb Opposition to Index --  Opposition WNL    R Index  MCP 0-90 90 Degrees   R Index PIP 0-100 100 Degrees   R Long  MCP 0-90 85 Degrees   R Long PIP 0-100 95 Degrees   R Ring  MCP 0-90 95 Degrees   R Ring PIP 0-100 100 Degrees   R Little  MCP 0-90 95 Degrees   R Little PIP 0-100 100 Degrees       Measurements taken for digits and wrist AROM  Grip and prehension  As wel las PRWHE function - simulated and pt ed on buttons hook use, penagain for writing and when pick up small object - stabilize with thumb and pick up with 2nd  That has better sensation   Manual done - pt had some questions on CMC - scar tissue, and webspace athropy  Also did Semmes Weinstein  Normal sensation - except thumb - 3.61- diminished light touch    Reviewed with pt HEP to cont with    Reviewed HEP with pt for 1/2 teal putty mix with  light blue to increase resistance - to not grip tight with 3rd    also done Lat/3 point with only minimal discomfort at 3rd - stop prior to pain  Needed min A  Rubber band for PA and RA - still  needed min A to do PA correctly  10 reps                      OT Education - 03/06/16 1332    Education provided Yes   Education Details discharge instruction    Person(s) Educated Patient   Methods Explanation;Demonstration;Tactile cues;Verbal cues   Comprehension Verbal cues required;Returned demonstration;Verbalized understanding          OT Short Term Goals - 03/06/16 1335      OT SHORT TERM GOAL #1   Title Pt to be independent in HEP to increase ROM, functional use , and strength   Status Achieved     OT SHORT TERM GOAL #2   Title AROM in digits and wrist improve to WNL to write, bathing and dressing with no issues   Baseline ed on use of button hook, pen again pen , some arthritis in 3rd digit    Status Achieved           OT Long Term Goals - 03/06/16 1335      OT LONG TERM GOAL #1   Title R Grip strength improve to at least 50% compare to L to turn doorknob , carry 5 lbs    Baseline Grip 20 R, L 26- and she can lift and carry 10 lbs    Status Achieved     OT LONG TERM GOAL #2   Title Function on PRWHE improve with at least 20 points    Baseline Function at Eval 28.5/50  and now 7/50   Status Achieved     OT LONG TERM GOAL #3   Title Pt report improvement in sensory issues - to tell textures, write, do buttons and play cards    Baseline semmes weinstein normal sensation in all digits - except thumb distal to MP 3.61 - diminished light touch    Status Achieved               Plan - 03/06/16 1332    Clinical Impression Statement Pt made great gains in pain, scar tissue, functional  use , grip and ROM at wrist - pt has some arthritis in 3rd digit and thumb CMC - but report pain not always every day anymore - still show athropy in webspace - decrease light touch in thumb - but test normal sensation in other digits - pt met all goals and ed on use of  AE and other modifications - pt discharge at this time with HEP    OT  Treatment/Interventions Self-care/ADL training;Cryotherapy;Ultrasound;Fluidtherapy;Patient/family education;Therapeutic exercises;Contrast Bath;Scar mobilization;Passive range of motion;Manual Therapy   Plan discharge with HEP    OT Home Exercise Plan see pt instruction    Consulted and Agree with Plan of Care Patient      Patient will benefit from skilled therapeutic intervention in order to improve the following deficits and impairments:     Visit Diagnosis: Stiffness of right hand, not elsewhere classified  Other disturbances of skin sensation  Muscle weakness (generalized)    Problem List Patient Active Problem List   Diagnosis Date Noted  . Urinary frequency 12/27/2015  . Atrophic vaginitis 09/26/2015  . Umbilical hernia without obstruction and without gangrene 06/26/2015  . Elevated blood sugar 05/29/2015  . Carpal tunnel syndrome of right wrist 05/29/2015  . Chronic fatigue 05/01/2015  . Left leg pain 05/01/2015  . Closed fracture of part of upper end of humerus 01/29/2015  . Skin lesion 01/29/2015  . Recurrent UTI 11/21/2014  . Vaginal atrophy 11/21/2014  . Urethral caruncle 11/21/2014  . Mitral valve regurgitation 05/09/2014  . History of pancreatitis 03/09/2014  . Medicare annual wellness visit, subsequent 10/06/2012  . Hemorrhoids 06/08/2012  . Osteoarthritis 03/08/2012  . Screening for breast cancer 03/08/2012  . Pernicious anemia 02/04/2012  . History of colectomy 02/04/2012  . Vitamin D deficiency 07/30/2011  . Chronic anticoagulation 06/13/2011  . Atrial fibrillation ([place]) 06/13/2011  . CAD (coronary artery disease) 06/13/2011  . Chronic diarrhea 06/13/2011    Rosalyn Gess OTR/L,CLT 03/06/2016, 1:38 PM  [facility] [address], [place], [address] Phone: [phone]   Fax:  [phone]  Name: Kara [name] MRN: [id number] Date of Birth: 1922-07-03

## 2016-03-11 DIAGNOSIS — I480 Paroxysmal atrial fibrillation: Secondary | ICD-10-CM | POA: Diagnosis not present

## 2016-03-11 DIAGNOSIS — I482 Chronic atrial fibrillation: Secondary | ICD-10-CM | POA: Diagnosis not present

## 2016-03-11 LAB — POCT INR: INR: 1.8 — AB (ref 0.9–1.1)

## 2016-03-14 DIAGNOSIS — Z85828 Personal history of other malignant neoplasm of skin: Secondary | ICD-10-CM | POA: Diagnosis not present

## 2016-03-14 DIAGNOSIS — Z08 Encounter for follow-up examination after completed treatment for malignant neoplasm: Secondary | ICD-10-CM | POA: Diagnosis not present

## 2016-03-14 DIAGNOSIS — B078 Other viral warts: Secondary | ICD-10-CM | POA: Diagnosis not present

## 2016-03-14 DIAGNOSIS — R58 Hemorrhage, not elsewhere classified: Secondary | ICD-10-CM | POA: Diagnosis not present

## 2016-03-19 DIAGNOSIS — E119 Type 2 diabetes mellitus without complications: Secondary | ICD-10-CM | POA: Diagnosis not present

## 2016-03-19 LAB — HM DIABETES EYE EXAM

## 2016-03-26 ENCOUNTER — Telehealth: Payer: Self-pay

## 2016-03-26 LAB — POCT INR: INR: 1.7 — AB (ref <=1.1)

## 2016-03-26 NOTE — Telephone Encounter (Signed)
Spoke to patient she is on Coumadin 1 mg daily. She missed her dose last night .

## 2016-03-26 NOTE — Telephone Encounter (Signed)
MD INR Lab   [phone] [place] calls to report out of range  INR  03/26/16 of  1.7  Please advise .

## 2016-03-26 NOTE — Telephone Encounter (Signed)
have her take 2 mg tonight  Then resume 1 mg daily tomorrow .  Recheck in one week

## 2016-03-26 NOTE — Telephone Encounter (Signed)
Patient advised of below and verbalized and understanding  

## 2016-03-31 ENCOUNTER — Other Ambulatory Visit: Payer: Self-pay

## 2016-04-02 ENCOUNTER — Encounter: Payer: Self-pay | Admitting: Internal Medicine

## 2016-04-02 LAB — PROTIME-INR: INR: 2.4 — AB (ref 0.9–1.1)

## 2016-04-10 DIAGNOSIS — I482 Chronic atrial fibrillation: Secondary | ICD-10-CM | POA: Diagnosis not present

## 2016-04-18 ENCOUNTER — Other Ambulatory Visit: Payer: Self-pay

## 2016-04-18 MED ORDER — WARFARIN SODIUM 1 MG PO TABS: 1.0000 mg | ORAL_TABLET | Freq: Every day | ORAL | 0 refills | Status: DC

## 2016-04-18 NOTE — Telephone Encounter (Signed)
Medication has been refilled. Patient stated her INR was 2.5 and patient will be having mdINR resend results.

## 2016-04-23 DIAGNOSIS — I10 Essential (primary) hypertension: Secondary | ICD-10-CM | POA: Diagnosis not present

## 2016-04-23 DIAGNOSIS — I340 Nonrheumatic mitral (valve) insufficiency: Secondary | ICD-10-CM | POA: Diagnosis not present

## 2016-04-23 DIAGNOSIS — I2510 Atherosclerotic heart disease of native coronary artery without angina pectoris: Secondary | ICD-10-CM | POA: Diagnosis not present

## 2016-04-23 DIAGNOSIS — I495 Sick sinus syndrome: Secondary | ICD-10-CM | POA: Diagnosis not present

## 2016-04-23 DIAGNOSIS — E782 Mixed hyperlipidemia: Secondary | ICD-10-CM | POA: Diagnosis not present

## 2016-04-23 DIAGNOSIS — I480 Paroxysmal atrial fibrillation: Secondary | ICD-10-CM | POA: Diagnosis not present

## 2016-04-23 DIAGNOSIS — I071 Rheumatic tricuspid insufficiency: Secondary | ICD-10-CM | POA: Diagnosis not present

## 2016-04-23 DIAGNOSIS — R5383 Other fatigue: Secondary | ICD-10-CM | POA: Diagnosis not present

## 2016-04-23 DIAGNOSIS — R600 Localized edema: Secondary | ICD-10-CM | POA: Diagnosis not present

## 2016-05-01 LAB — POCT INR: INR: 2.3 — AB (ref <=1.1)

## 2016-05-05 ENCOUNTER — Telehealth: Payer: Self-pay | Admitting: *Deleted

## 2016-05-05 NOTE — Telephone Encounter (Signed)
Call pt  I would advise NOT to take tumeric as may cause INR to rise. Coumadin  is quite sensitive and there is some literature that suggests it also causes blood to thin.

## 2016-05-05 NOTE — Telephone Encounter (Signed)
Is patient able to do so.

## 2016-05-05 NOTE — Telephone Encounter (Signed)
Patient requested a call in reference to her taking tumeric along with her coumadin  Pt contact  [phone]

## 2016-05-06 NOTE — Telephone Encounter (Signed)
Patient has been informed.

## 2016-05-08 ENCOUNTER — Telehealth: Payer: Self-pay | Admitting: Radiology

## 2016-05-08 DIAGNOSIS — I482 Chronic atrial fibrillation: Secondary | ICD-10-CM | POA: Diagnosis not present

## 2016-05-08 LAB — POCT INR: INR: 1.9 — AB (ref <=1.1)

## 2016-05-08 NOTE — Telephone Encounter (Signed)
Please advise on INR

## 2016-05-08 NOTE — Telephone Encounter (Signed)
Pt's inr was 1.9.  Talked to pt and confirmed.  She thinks she missed dose.  Will continue on her current dose she is taking now and recheck next week.  Pt in agreement and is doing fine.

## 2016-05-08 NOTE — Telephone Encounter (Signed)
Critical lab result called by [facility] 05/08/16 @ 1640. Pt INR result was 1.9. Sending to Dr. Nicki Reaper in Margaret's absence.

## 2016-05-09 NOTE — Telephone Encounter (Signed)
Noted  

## 2016-05-14 LAB — POCT INR: INR: 2.2 — AB (ref 0.9–1.1)

## 2016-05-20 ENCOUNTER — Encounter: Payer: Self-pay | Admitting: Family

## 2016-05-20 ENCOUNTER — Telehealth: Payer: Self-pay | Admitting: Family

## 2016-05-20 DIAGNOSIS — K529 Noninfective gastroenteritis and colitis, unspecified: Secondary | ICD-10-CM

## 2016-05-20 NOTE — Telephone Encounter (Signed)
Please call pt-  Clarify why she takes lomotil as she is asking for refill.   How long has she had diarrhea?  No blood?   How many times per week does she use?

## 2016-05-20 NOTE — Telephone Encounter (Signed)
fyi

## 2016-05-20 NOTE — Telephone Encounter (Signed)
Left message to return call 

## 2016-05-20 NOTE — Telephone Encounter (Signed)
Patient stated that she continues to take lomotil. Pt has had diarrhea for years, due to two stomach surgeries. Pt has had intestine removed and has caused some urgency. No blood reported. The medication is PRN depending on stool looseness  Pt contact [phone]

## 2016-05-20 NOTE — Telephone Encounter (Signed)
Pt called back returning your call. Thank you!  Call pt @ [phone]

## 2016-05-21 MED ORDER — DIPHENOXYLATE-ATROPINE 2.5-0.025 MG PO TABS: ORAL_TABLET | ORAL | 0 refills | Status: DC

## 2016-05-21 NOTE — Telephone Encounter (Signed)
Noted / refilled

## 2016-05-22 ENCOUNTER — Telehealth: Payer: Self-pay

## 2016-05-22 NOTE — Telephone Encounter (Signed)
Thank you Please have her check again in one week  Please ensure no SOB, leg swelling when you call

## 2016-05-22 NOTE — Telephone Encounter (Signed)
MD INR [phone] Critical Lab Result INR 1.9

## 2016-05-22 NOTE — Telephone Encounter (Signed)
Left message for patient to return call back.  

## 2016-05-22 NOTE — Telephone Encounter (Signed)
Spoke with patient and she thinks she may have missed a dose a last week and also took cold medication for cold.

## 2016-05-23 NOTE — Telephone Encounter (Signed)
Patient stated that she does not have SOB.  Patient did however have swelling in one leg, but she says its very chronic.  Been happening for years.  Patient will be repeating INR next week.

## 2016-05-28 LAB — POCT INR: INR: 1.9 — AB (ref <=1.1)

## 2016-05-29 ENCOUNTER — Telehealth: Payer: Self-pay | Admitting: Family

## 2016-05-29 NOTE — Telephone Encounter (Signed)
Okay. We will recheck next week

## 2016-05-29 NOTE — Telephone Encounter (Signed)
Call pt  inr 1.9  Did she miss a dose last week?

## 2016-05-29 NOTE — Telephone Encounter (Signed)
Patient has been informed and she has not missed a dose.

## 2016-06-04 DIAGNOSIS — I482 Chronic atrial fibrillation: Secondary | ICD-10-CM | POA: Diagnosis not present

## 2016-06-04 LAB — POCT INR: INR: 2.3 — AB (ref <=1.1)

## 2016-06-11 LAB — POCT INR: INR: 1.5 — AB (ref 0.9–1.1)

## 2016-06-12 ENCOUNTER — Telehealth: Payer: Self-pay | Admitting: Radiology

## 2016-06-12 NOTE — Telephone Encounter (Signed)
Left message for patient to return call back.  

## 2016-06-12 NOTE — Telephone Encounter (Signed)
Call pt  Did she miss a dose?

## 2016-06-12 NOTE — Telephone Encounter (Signed)
[place] from [facility] called with critical lab results. Stated pt INR results was 1.5 at 8:26Pm on 06/11/16. PCP notified via telephone call.

## 2016-06-13 DIAGNOSIS — M6588 Other synovitis and tenosynovitis, other site: Secondary | ICD-10-CM | POA: Diagnosis not present

## 2016-06-13 NOTE — Telephone Encounter (Signed)
Pt called back returning your call. Thank you!  Call pt @ [phone]

## 2016-06-13 NOTE — Telephone Encounter (Signed)
Patient was informed of results.  Patient understood and no questions, comments, or concerns at this time. Patient forgot to take dose then took half dose the next day.

## 2016-06-17 ENCOUNTER — Encounter: Payer: Self-pay | Admitting: Family

## 2016-06-17 ENCOUNTER — Ambulatory Visit (INDEPENDENT_AMBULATORY_CARE_PROVIDER_SITE_OTHER): Payer: Medicare Other | Admitting: Family

## 2016-06-17 ENCOUNTER — Telehealth: Payer: Self-pay | Admitting: *Deleted

## 2016-06-17 VITALS — BP 140/86 | HR 89 | Temp 97.5°F | Ht 62.0 in | Wt 132.4 lb

## 2016-06-17 DIAGNOSIS — R200 Anesthesia of skin: Secondary | ICD-10-CM

## 2016-06-17 DIAGNOSIS — M7989 Other specified soft tissue disorders: Secondary | ICD-10-CM | POA: Insufficient documentation

## 2016-06-17 DIAGNOSIS — E1140 Type 2 diabetes mellitus with diabetic neuropathy, unspecified: Secondary | ICD-10-CM | POA: Insufficient documentation

## 2016-06-17 DIAGNOSIS — R195 Other fecal abnormalities: Secondary | ICD-10-CM | POA: Diagnosis not present

## 2016-06-17 DIAGNOSIS — R197 Diarrhea, unspecified: Secondary | ICD-10-CM | POA: Insufficient documentation

## 2016-06-17 LAB — CBC WITH DIFFERENTIAL/PLATELET
Basophils Absolute: 0 10*3/uL (ref 0.0–0.1)
Basophils Relative: 0.1 % (ref 0.0–3.0)
Eosinophils Absolute: 0 10*3/uL (ref 0.0–0.7)
Eosinophils Relative: 0 % (ref 0.0–5.0)
HCT: 42.3 % (ref 36.0–46.0)
Hemoglobin: 14.2 g/dL (ref 12.0–15.0)
Lymphocytes Relative: 17.5 % (ref 12.0–46.0)
Lymphs Abs: 2.4 10*3/uL (ref 0.7–4.0)
MCHC: 33.7 g/dL (ref 30.0–36.0)
MCV: 91.8 fl (ref 78.0–100.0)
Monocytes Absolute: 0.5 10*3/uL (ref 0.1–1.0)
Monocytes Relative: 3.3 % (ref 3.0–12.0)
Neutro Abs: 10.7 10*3/uL — ABNORMAL HIGH (ref 1.4–7.7)
Neutrophils Relative %: 79.1 % — ABNORMAL HIGH (ref 43.0–77.0)
Platelets: 212 10*3/uL (ref 150.0–400.0)
RBC: 4.6 Mil/uL (ref 3.87–5.11)
RDW: 13.8 % (ref 11.5–15.5)
WBC: 13.5 10*3/uL — ABNORMAL HIGH (ref 4.0–10.5)

## 2016-06-17 LAB — HEMOGLOBIN A1C: Hgb A1c MFr Bld: 6.4 % (ref 4.6–6.5)

## 2016-06-17 LAB — COMPREHENSIVE METABOLIC PANEL
ALT: 18 U/L (ref 0–35)
AST: 14 U/L (ref 0–37)
Albumin: 4.1 g/dL (ref 3.5–5.2)
Alkaline Phosphatase: 61 U/L (ref 39–117)
BUN: 23 mg/dL (ref 6–23)
CO2: 26 mEq/L (ref 19–32)
Calcium: 9.2 mg/dL (ref 8.4–10.5)
Chloride: 105 mEq/L (ref 96–112)
Creatinine, Ser: 0.93 mg/dL (ref 0.40–1.20)
GFR: 59.96 mL/min — ABNORMAL LOW (ref >60.00)
Glucose, Bld: 207 mg/dL — ABNORMAL HIGH (ref 70–99)
Potassium: 3.4 mEq/L — ABNORMAL LOW (ref 3.5–5.1)
Sodium: 140 mEq/L (ref 135–145)
Total Bilirubin: 1 mg/dL (ref 0.2–1.2)
Total Protein: 6.8 g/dL (ref 6.0–8.3)

## 2016-06-17 LAB — B12 AND FOLATE PANEL
Folate: 19.3 ng/mL (ref >5.9)
Vitamin B-12: 584 pg/mL (ref 211–911)

## 2016-06-17 NOTE — Progress Notes (Signed)
Pre visit review using our clinic review tool, if applicable. No additional management support is needed unless otherwise documented below in the visit note. 

## 2016-06-17 NOTE — Assessment & Plan Note (Signed)
New. Occasionally light colored and then normal, brown. No blood. No abdominal pain, N, V to suggest acute hepatitis. Pending CMP. Will follow.

## 2016-06-17 NOTE — Patient Instructions (Addendum)
Labs today  Try OTC capsaicin cream for burning in legs.   Follow up 3 months

## 2016-06-17 NOTE — Assessment & Plan Note (Addendum)
New. Sensation intake on exam. Pending folate, B12, A1c.trial capsaicin. Will follow.

## 2016-06-17 NOTE — Progress Notes (Signed)
Subjective:    Patient ID: Kara [name], female    DOB: 1922-08-01, 83 y.o.   MRN: [id number]  CC: Kara [name] is a 83 y.o. female who presents today for follow up.   HPI: Here for follow up with multiple complaints  Since last visit- On prednisone for right hand tendinitis , improving.   Described intermittent 'light brown' colored stools over past couple of months. No blood. Has normal stools as well. No dysuria, abdominal pain, pain with meals, N, V.   C/o RLE numbness, tingling , for past several months. Unchanged. No wounds on feet.   Chronic right le swelling 'for years'. Worsens with salt. Wear compression stocking from time to time with relief. Follows with Dr. Lucky Cowboy. No SOB, CP, orthopnea, fever. Doesn't improve with elevation.     Dr. Nehemiah Massed, decreased beta blocker due to fatigue. Consider reinstatement of aldactone for hypokalemia Has pacemaker  Dr. Nicola Police orthopedic after carpal tunnel surgery.  HISTORY:  Past Medical History:  Diagnosis Date  . Allergy   . Atrial fibrillation ([place])   . CAD (coronary artery disease)   . Cancer (HCC)    UTERINE  . Chronic anticoagulation   . Chronic diarrhea   . Decubitus ulcer    sacral region  . Diabetes ([place])    diet controlled  . Dysuria   . Edema of lower extremity    mainly right foot, slightly in left foot.  . Fibrocystic breast disease   . GERD (gastroesophageal reflux disease)   . Glaucoma   . Glaucoma   . Hematuria   . Hemorrhoids   . History of colon polyps   . History of pancreatitis   . Hyperlipidemia   . Hypertension   . Hypokalemia   . IBS (irritable bowel syndrome)   . Microscopic hematuria   . Mitral valve regurgitation   . Osteoarthritis    fingers  . Pernicious anemia   . Plantar fasciitis   . Recurrent UTI   . Skin cancer   . Sleep apnea, obstructive   . Vaginal atrophy   . Vitamin D deficiency   . Yeast vaginitis    Past Surgical History:  Procedure Laterality Date  . ABDOMINAL  HYSTERECTOMY  1980's  . ABDOMINAL SURGERY     for villous polyp,,,many years ago  . APPENDECTOMY  1940's  . ASCAD, s/p PTCA  11/28/2005   MID LESION   . BREAST BIOPSY Left 1970's  . CARPAL TUNNEL RELEASE Right 12/13/2015   Procedure: CARPAL TUNNEL RELEASE;  Surgeon: Thornton Park, MD;  Location: [facility] ORS;  Service: Orthopedics;  Laterality: Right;  . COLECTOMY    . PACEMAKER PLACEMENT    . radation     for uterine cance  . REFRACTIVE SURGERY     for bilateral glaumoma  . TONSILLECTOMY  1936   Family History  Problem Relation Age of Onset  . Coronary artery disease Father   . Kidney disease Father   . Cancer Sister   . Diabetes    . Cancer Mother     COLON  . Bladder Cancer Neg Hx   . Breast cancer Neg Hx     Allergies: Baycol [cerivastatin sodium]; Cardizem [diltiazem hcl]; Crestor [rosuvastatin calcium]; Dronedarone; Metoprolol tartrate; Omeprazole; Pravachol; Statins; Vioxx [rofecoxib]; and Zocor [simvastatin] Current Outpatient Prescriptions on File Prior to Visit  Medication Sig Dispense Refill  . cholecalciferol (VITAMIN D) 1000 UNITS tablet Take 1,000 Units by mouth daily.    . cyanocobalamin (,VITAMIN B-12,)  1000 MCG/ML injection INJECT 1 ML  INTO THE MUSCLE EVERY 30 DAYS. 1 mL 10  . diltiazem (CARDIZEM SR) 120 MG 12 hr capsule Take 120 mg by mouth every morning.    . diphenoxylate-atropine (LOMOTIL) 2.5-0.025 MG tablet TAKE 1 TABLET TWO TIMES DAILY AS NEEDED FOR DIARHREA 90 tablet 0  . estradiol (ESTRACE) 0.1 MG/GM vaginal cream Place 1 Applicatorful vaginally at bedtime. (Patient taking differently: Place 1 Applicatorful vaginally daily as needed. ) 42.5 g 0  . HYDROcodone-acetaminophen (NORCO) 5-325 MG tablet Take 1-2 tablets by mouth every 4 (four) hours as needed for moderate pain. MAXIMUM TOTAL ACETAMINOPHEN DOSE IS 4000 MG PER DAY 40 tablet 0  . hydrocortisone-pramoxine (ANALPRAM-HC) 2.5-1 % rectal cream APPLY RECTALLY THREE TIMES DAILY (Patient taking differently:  APPLY RECTALLY THREE TIMES DAILY AS NEEDED) 30 g 0  . Hypromellose (GENTEAL) 0.3 % SOLN Place 1 drop into both eyes daily as needed (dryness).     Marland Kitchen lidocaine (LIDODERM) 5 % Place 1 patch onto the skin daily. Remove & Discard patch within 12 hours or as directed by MD 30 patch 0  . metoprolol (LOPRESSOR) 50 MG tablet TAKE 1 TABLET BY MOUTH 2  TIMES DAILY. (Patient taking differently: 25mg  in the morning and 25mg  in the evening) 60 tablet 4  . ondansetron (ZOFRAN) 4 MG tablet Take 1 tablet (4 mg total) by mouth every 8 (eight) hours as needed for nausea or vomiting. 30 tablet 0  . triamcinolone cream (KENALOG) 0.1 % Apply 1 application topically 2 (two) times daily. (Patient taking differently: Apply 1 application topically 2 (two) times daily as needed. ) 30 g 0  . warfarin (COUMADIN) 1 MG tablet Take 1 tablet (1 mg total) by mouth daily at 6 PM. 45 tablet 0   No current facility-administered medications on file prior to visit.     Social History  Substance Use Topics  . Smoking status: Former Research scientist (life sciences)  . Smokeless tobacco: Never Used     Comment: quit 45 years ago  . Alcohol use 0.0 oz/week     Comment: Rarely, 1-2 times a year    Review of Systems  Constitutional: Negative for chills and fever.  Respiratory: Negative for cough and shortness of breath.   Cardiovascular: Positive for leg swelling (chronic right). Negative for chest pain and palpitations.  Gastrointestinal: Negative for nausea and vomiting.      Objective:    BP 140/86   Pulse 89   Temp 97.5 F (36.4 C) (Oral)   Ht 5\' 2"  (1.575 m)   Wt 132 lb 6.4 oz (60.1 kg)   SpO2 96%   BMI 24.22 kg/m  BP Readings from Last 3 Encounters:  06/17/16 140/86  12/27/15 110/68  12/13/15 139/79   Wt Readings from Last 3 Encounters:  06/17/16 132 lb 6.4 oz (60.1 kg)  12/27/15 128 lb 9.6 oz (58.3 kg)  12/12/15 130 lb 11.7 oz (59.3 kg)    Physical Exam  Constitutional: She appears well-developed and well-nourished.  Eyes:  Conjunctivae are normal.  Cardiovascular: Normal rate, regular rhythm, normal heart sounds and normal pulses.    RLE edema, +1. No  palpable cords or masses. No erythema or increased warmth. asymmetry in right calf size when compared bilaterally LE hair growth symmetric and present. No discoloration of varicosities noted. LE warm and palpable pedal pulses.   Pulmonary/Chest: Effort normal and breath sounds normal. She has no wheezes. She has no rhonchi. She has no rales.  Neurological: She is alert.  Sensation intact BLE with microfilament.   Skin: Skin is warm and dry.  Psychiatric: She has a normal mood and affect. Her speech is normal and behavior is normal. Thought content normal.  Vitals reviewed.      Assessment & Plan:   Problem List Items Addressed This Visit      Other   Leg swelling    Chronic. On coumadin. No tenderness or erythema. Offered LUE [place] and patient politely declined.       Leg numbness - Primary    New. Sensation intake on exam. Pending folate, B12, A1c.trial capsaicin. Will follow.       Relevant Orders   Comprehensive metabolic panel   CBC with Differential/Platelet   B12 and Folate Panel   Hemoglobin A1c   Change in stool    New. Occasionally light colored and then normal, brown. No blood. No abdominal pain, N, V to suggest acute hepatitis. Pending CMP. Will follow.           I am having Ms. Harmsen maintain her cholecalciferol, Hypromellose, lidocaine, cyanocobalamin, hydrocortisone-pramoxine, triamcinolone cream, metoprolol, estradiol, diltiazem, HYDROcodone-acetaminophen, ondansetron, warfarin, diphenoxylate-atropine, and methylPREDNISolone.   Meds ordered this encounter  Medications  . methylPREDNISolone (MEDROL DOSEPAK) 4 MG TBPK tablet    Refill:  0    Return precautions given.   Risks, benefits, and alternatives of the medications and treatment plan prescribed today were discussed, and patient expressed understanding.   Education  regarding symptom management and diagnosis given to patient on AVS.  Continue to follow with Mable Paris, FNP for routine health maintenance.   [name] and I agreed with plan.   Mable Paris, FNP

## 2016-06-17 NOTE — Telephone Encounter (Signed)
Pt was given a OTC medication to use for her legs and feet, pt can not remember the name of the medication  Pt [phone]

## 2016-06-17 NOTE — Telephone Encounter (Signed)
OTC capsaicin.  Please let patient know

## 2016-06-17 NOTE — Telephone Encounter (Signed)
Please advise 

## 2016-06-17 NOTE — Assessment & Plan Note (Addendum)
Chronic. On coumadin. No tenderness or erythema. Offered LUE [place] and patient politely declined.

## 2016-06-18 NOTE — Telephone Encounter (Signed)
Patient has been been notified.

## 2016-06-19 ENCOUNTER — Other Ambulatory Visit: Payer: Self-pay | Admitting: Family

## 2016-06-19 ENCOUNTER — Telehealth: Payer: Self-pay | Admitting: Family

## 2016-06-19 DIAGNOSIS — E876 Hypokalemia: Secondary | ICD-10-CM

## 2016-06-19 MED ORDER — WARFARIN SODIUM 1 MG PO TABS: 1.0000 mg | ORAL_TABLET | Freq: Every day | ORAL | 0 refills | Status: DC

## 2016-06-19 NOTE — Telephone Encounter (Signed)
Medication has been refilled.

## 2016-06-19 NOTE — Telephone Encounter (Signed)
warfarin (COUMADIN) 1 MG tablet take 1 mg and 1.5 mg alternating . Qty 37.

## 2016-06-20 ENCOUNTER — Telehealth: Payer: Self-pay | Admitting: Family

## 2016-06-20 NOTE — Telephone Encounter (Signed)
See result note.  

## 2016-06-20 NOTE — Telephone Encounter (Signed)
PT called back returning your call in regards to labs. Thank you!  Call pt @ [phone]

## 2016-06-25 ENCOUNTER — Other Ambulatory Visit: Payer: Medicare Other

## 2016-06-26 LAB — POCT INR: INR: 3 — AB (ref <=1.1)

## 2016-07-02 DIAGNOSIS — Z7901 Long term (current) use of anticoagulants: Secondary | ICD-10-CM | POA: Diagnosis not present

## 2016-07-02 LAB — POCT INR: INR: 2.4 — AB (ref <=1.1)

## 2016-07-16 LAB — POCT INR: INR: 2.3 — AB (ref 0.9–1.1)

## 2016-07-18 ENCOUNTER — Telehealth: Payer: Self-pay

## 2016-07-18 NOTE — Telephone Encounter (Signed)
Received PT/INR result. Test date 07/16/2016. Pt's INR range is 2.0-3.0. Result 2.3.   Please advise.

## 2016-07-21 ENCOUNTER — Other Ambulatory Visit: Payer: Self-pay

## 2016-07-21 NOTE — Telephone Encounter (Signed)
noted 

## 2016-07-23 ENCOUNTER — Telehealth: Payer: Self-pay | Admitting: Radiology

## 2016-07-23 LAB — PROTIME-INR

## 2016-07-23 NOTE — Telephone Encounter (Signed)
Critical INR called by Jackelyn Poling of [facility]. Debbie states pt INR is 1.6

## 2016-07-23 NOTE — Telephone Encounter (Signed)
Spoke with daughter who was going to try to contact the patient as well and contact me back.

## 2016-07-23 NOTE — Telephone Encounter (Addendum)
Spoke with patient states she missed her Sunday night dose of Coumadin.  Erik Obey of this.  Per verbal orders from Mable Paris needs needs to check PT/INR on Friday.   Called patient back advised her to re-check PT/INR and advised her to continue current Coumadin regimen.  Patient verbalized an understanding

## 2016-07-23 NOTE — Telephone Encounter (Signed)
Left message for patient to call.

## 2016-07-24 NOTE — Telephone Encounter (Signed)
Noted. As discussed , advised patient to check INR Friday or Monday latest.   Thank you

## 2016-07-25 ENCOUNTER — Telehealth: Payer: Self-pay | Admitting: Family

## 2016-07-25 ENCOUNTER — Telehealth: Payer: Self-pay

## 2016-07-25 LAB — POCT INR: INR: 1.7 — AB (ref 0.9–1.1)

## 2016-07-25 NOTE — Telephone Encounter (Signed)
CRITICAL LAB Patients INR- 1.7. Please advise.

## 2016-07-25 NOTE — Telephone Encounter (Signed)
Spoke with pt-  She will take an extra half tablet of coumadin and recheck inr next week. She has done this in the past when she forget a coumadin.

## 2016-07-25 NOTE — Telephone Encounter (Signed)
Call pt  Please advise her to recheck again on Monday. She missed a dose this week.  Any signs of stroke from afib, she must go to ED

## 2016-07-28 NOTE — Telephone Encounter (Signed)
Patient stated she understands but she will do her INR wednsday.

## 2016-07-29 DIAGNOSIS — R208 Other disturbances of skin sensation: Secondary | ICD-10-CM | POA: Diagnosis not present

## 2016-07-29 DIAGNOSIS — L820 Inflamed seborrheic keratosis: Secondary | ICD-10-CM | POA: Diagnosis not present

## 2016-07-29 DIAGNOSIS — L538 Other specified erythematous conditions: Secondary | ICD-10-CM | POA: Diagnosis not present

## 2016-07-30 DIAGNOSIS — Z7901 Long term (current) use of anticoagulants: Secondary | ICD-10-CM | POA: Diagnosis not present

## 2016-07-30 LAB — POCT INR: INR: 2.3 — AB (ref 0.9–1.1)

## 2016-07-31 ENCOUNTER — Telehealth: Payer: Self-pay

## 2016-07-31 NOTE — Telephone Encounter (Signed)
I abstracted INR DOS 07/30/16 2.3 .  It is in your folder for review .  Just wanted to let you know.

## 2016-08-01 ENCOUNTER — Ambulatory Visit: Payer: Medicare Other

## 2016-08-01 ENCOUNTER — Other Ambulatory Visit: Payer: Self-pay | Admitting: Family

## 2016-08-01 NOTE — Telephone Encounter (Signed)
Thank you, noted.

## 2016-08-04 NOTE — Telephone Encounter (Signed)
Patient advised of below to continue current dose verbalized understanding

## 2016-08-06 LAB — POCT INR: INR: 2.9 — AB (ref 0.9–1.1)

## 2016-08-06 NOTE — Telephone Encounter (Signed)
Pt called requesting this refill. Please advise, thank you!  Call pt @ [phone]

## 2016-08-11 ENCOUNTER — Other Ambulatory Visit: Payer: Self-pay

## 2016-08-11 ENCOUNTER — Telehealth: Payer: Self-pay | Admitting: Family

## 2016-08-11 MED ORDER — METOPROLOL TARTRATE 50 MG PO TABS: ORAL_TABLET | ORAL | 4 refills | Status: DC

## 2016-08-11 NOTE — Telephone Encounter (Signed)
Medication has been refilled.

## 2016-08-11 NOTE — Telephone Encounter (Signed)
Pt called and is requesting a refill on her metoprolol (LOPRESSOR) 50 MG tablet. Please advise, thank you!  [place], [place]  Call pt @ [phone]

## 2016-08-13 LAB — PROTIME-INR

## 2016-08-20 LAB — POCT INR: INR: 2.4 — AB (ref 0.9–1.1)

## 2016-08-22 NOTE — Progress Notes (Signed)
Pre visit review using our clinic review tool, if applicable. No additional management support is needed unless otherwise documented below in the visit note. 

## 2016-08-25 ENCOUNTER — Telehealth: Payer: Self-pay

## 2016-08-25 DIAGNOSIS — K529 Noninfective gastroenteritis and colitis, unspecified: Secondary | ICD-10-CM

## 2016-08-25 MED ORDER — DIPHENOXYLATE-ATROPINE 2.5-0.025 MG PO TABS: ORAL_TABLET | ORAL | 0 refills | Status: DC

## 2016-08-25 NOTE — Telephone Encounter (Signed)
Refill request for Lomotil, last seen [id number], last filled [id number].  Please advise. [employer]

## 2016-08-27 DIAGNOSIS — Z7901 Long term (current) use of anticoagulants: Secondary | ICD-10-CM | POA: Diagnosis not present

## 2016-08-27 LAB — POCT INR: INR: 2.5 — AB (ref <=1.1)

## 2016-08-28 DIAGNOSIS — I480 Paroxysmal atrial fibrillation: Secondary | ICD-10-CM | POA: Diagnosis not present

## 2016-08-29 NOTE — Progress Notes (Unsigned)
Pre visit review using our clinic review tool, if applicable. No additional management support is needed unless otherwise documented below in the visit note. 

## 2016-09-03 LAB — POCT INR: INR: 2.8 — AB (ref 0.9–1.1)

## 2016-09-04 NOTE — Progress Notes (Unsigned)
Pre visit review using our clinic review tool, if applicable. No additional management support is needed unless otherwise documented below in the visit note. 

## 2016-09-10 LAB — POCT INR: INR: 2.5 — AB (ref 0.9–1.1)

## 2016-09-15 ENCOUNTER — Encounter: Payer: Self-pay | Admitting: Family

## 2016-09-15 ENCOUNTER — Ambulatory Visit (INDEPENDENT_AMBULATORY_CARE_PROVIDER_SITE_OTHER): Payer: Medicare Other | Admitting: Family

## 2016-09-15 VITALS — BP 130/64 | HR 92 | Temp 97.7°F | Wt 133.2 lb

## 2016-09-15 DIAGNOSIS — K529 Noninfective gastroenteritis and colitis, unspecified: Secondary | ICD-10-CM | POA: Diagnosis not present

## 2016-09-15 DIAGNOSIS — I4891 Unspecified atrial fibrillation: Secondary | ICD-10-CM

## 2016-09-15 DIAGNOSIS — E876 Hypokalemia: Secondary | ICD-10-CM

## 2016-09-15 DIAGNOSIS — R200 Anesthesia of skin: Secondary | ICD-10-CM | POA: Diagnosis not present

## 2016-09-15 LAB — COMPREHENSIVE METABOLIC PANEL
ALT: 14 U/L (ref 0–35)
AST: 18 U/L (ref 0–37)
Albumin: 3.7 g/dL (ref 3.5–5.2)
Alkaline Phosphatase: 62 U/L (ref 39–117)
BUN: 15 mg/dL (ref 6–23)
CO2: 31 mEq/L (ref 19–32)
Calcium: 9.4 mg/dL (ref 8.4–10.5)
Chloride: 101 mEq/L (ref 96–112)
Creatinine, Ser: 0.87 mg/dL (ref 0.40–1.20)
GFR: 64.73 mL/min (ref >60.00)
Glucose, Bld: 165 mg/dL — ABNORMAL HIGH (ref 70–99)
Potassium: 3.4 mEq/L — ABNORMAL LOW (ref 3.5–5.1)
Sodium: 141 mEq/L (ref 135–145)
Total Bilirubin: 1.2 mg/dL (ref 0.2–1.2)
Total Protein: 6.5 g/dL (ref 6.0–8.3)

## 2016-09-15 NOTE — Progress Notes (Signed)
Pre visit review using our clinic review tool, if applicable. No additional management support is needed unless otherwise documented below in the visit note. 

## 2016-09-15 NOTE — Assessment & Plan Note (Addendum)
HR today 92 ( prior 89). Advised may continue on 25mg  metoprolol as long as HR stays stable. ON coumadin. INR has been at goal. Follows with cardiology for a fib and pacemaker surveillance. Will continue to follow.

## 2016-09-15 NOTE — Assessment & Plan Note (Signed)
History of. Unable to tolerate PO KCl. Pending cmp today

## 2016-09-15 NOTE — Assessment & Plan Note (Signed)
Stable  Continue current regimen  

## 2016-09-15 NOTE — Patient Instructions (Addendum)
Pleasure seeing you  Labs today  Capsacin gel or cream - may try for feet numbness, tingling.

## 2016-09-15 NOTE — Assessment & Plan Note (Signed)
Would like to try topical therapy at this time. Folate and b12 normal. Advised capsacin. Will let me know if not better

## 2016-09-15 NOTE — Progress Notes (Signed)
Subjective:    Patient ID: Kara [name], female    DOB: July 15, 1922, 83 y.o.   MRN: [id number]  CC: Kara [name] is a 83 y.o. female who presents today for follow up.   HPI: Afib- taking half of metoprolol , taking 25mg  at night. This has helped with being 'so tired.' Compliant with cardizem. 'very occasionally' will feel a palpitations at night. Denies exertional chest pain or pressure, numbness or tingling radiating to left arm or jaw,  dizziness, frequent headaches, changes in vision, or shortness of breath.   Pacemaker- had checked with [facility] 2 weeks ago; normal per patient.   Hypokalemia- started 2-3 years ago after intestinal surgery for 'blockage' and pancreatitis; tried to KCl however made diarrhea worse. Tries to incorporate potasium rich foods.   Chronic diarrhea- stable. Takes lomotil with resolve.  Numbness on feet- unchanged from last visit. Doesn't want to take medication       HISTORY:  Past Medical History:  Diagnosis Date  . Allergy   . Atrial fibrillation ([place])   . CAD (coronary artery disease)   . Cancer (HCC)    UTERINE  . Chronic anticoagulation   . Chronic diarrhea   . Decubitus ulcer    sacral region  . Diabetes ([place])    diet controlled  . Dysuria   . Edema of lower extremity    mainly right foot, slightly in left foot.  . Fibrocystic breast disease   . GERD (gastroesophageal reflux disease)   . Glaucoma   . Glaucoma   . Hematuria   . Hemorrhoids   . History of colon polyps   . History of pancreatitis   . Hyperlipidemia   . Hypertension   . Hypokalemia   . IBS (irritable bowel syndrome)   . Microscopic hematuria   . Mitral valve regurgitation   . Osteoarthritis    fingers  . Pernicious anemia   . Plantar fasciitis   . Recurrent UTI   . Skin cancer   . Sleep apnea, obstructive   . Vaginal atrophy   . Vitamin D deficiency   . Yeast vaginitis    Past Surgical History:  Procedure Laterality Date  . ABDOMINAL HYSTERECTOMY   1980's  . ABDOMINAL SURGERY     for villous polyp,,,many years ago  . APPENDECTOMY  1940's  . ASCAD, s/p PTCA  11/28/2005   MID LESION   . BREAST BIOPSY Left 1970's  . CARPAL TUNNEL RELEASE Right 12/13/2015   Procedure: CARPAL TUNNEL RELEASE;  Surgeon: Thornton Park, MD;  Location: [facility] ORS;  Service: Orthopedics;  Laterality: Right;  . COLECTOMY    . PACEMAKER PLACEMENT    . radation     for uterine cance  . REFRACTIVE SURGERY     for bilateral glaumoma  . TONSILLECTOMY  1936   Family History  Problem Relation Age of Onset  . Coronary artery disease Father   . Kidney disease Father   . Cancer Sister   . Diabetes    . Cancer Mother     COLON  . Bladder Cancer Neg Hx   . Breast cancer Neg Hx     Allergies: Baycol [cerivastatin sodium]; Cardizem [diltiazem hcl]; Crestor [rosuvastatin calcium]; Dronedarone; Metoprolol tartrate; Omeprazole; Pravachol; Statins; Vioxx [rofecoxib]; and Zocor [simvastatin] Current Outpatient Prescriptions on File Prior to Visit  Medication Sig Dispense Refill  . cholecalciferol (VITAMIN D) 1000 UNITS tablet Take 1,000 Units by mouth daily.    Marland Kitchen COUMADIN 1 MG tablet TAKE ONE TABLET  BY MOUTH DAILY AT 6PM 45 tablet 0  . cyanocobalamin (,VITAMIN B-12,) 1000 MCG/ML injection INJECT 1 ML  INTO THE MUSCLE EVERY 30 DAYS. 1 mL 10  . diltiazem (CARDIZEM SR) 120 MG 12 hr capsule Take 120 mg by mouth every morning.    . diphenoxylate-atropine (LOMOTIL) 2.5-0.025 MG tablet TAKE 1 TABLET TWO TIMES DAILY AS NEEDED FOR DIARHREA 90 tablet 0  . estradiol (ESTRACE) 0.1 MG/GM vaginal cream Place 1 Applicatorful vaginally at bedtime. (Patient taking differently: Place 1 Applicatorful vaginally daily as needed. ) 42.5 g 0  . HYDROcodone-acetaminophen (NORCO) 5-325 MG tablet Take 1-2 tablets by mouth every 4 (four) hours as needed for moderate pain. MAXIMUM TOTAL ACETAMINOPHEN DOSE IS 4000 MG PER DAY 40 tablet 0  . hydrocortisone-pramoxine (ANALPRAM-HC) 2.5-1 % rectal cream  APPLY RECTALLY THREE TIMES DAILY (Patient taking differently: APPLY RECTALLY THREE TIMES DAILY AS NEEDED) 30 g 0  . Hypromellose (GENTEAL) 0.3 % SOLN Place 1 drop into both eyes daily as needed (dryness).     Marland Kitchen lidocaine (LIDODERM) 5 % Place 1 patch onto the skin daily. Remove & Discard patch within 12 hours or as directed by MD 30 patch 0  . methylPREDNISolone (MEDROL DOSEPAK) 4 MG TBPK tablet   0  . metoprolol (LOPRESSOR) 50 MG tablet 25mg  in the morning and 25mg  in the evening 60 tablet 4  . ondansetron (ZOFRAN) 4 MG tablet Take 1 tablet (4 mg total) by mouth every 8 (eight) hours as needed for nausea or vomiting. 30 tablet 0  . triamcinolone cream (KENALOG) 0.1 % Apply 1 application topically 2 (two) times daily. (Patient taking differently: Apply 1 application topically 2 (two) times daily as needed. ) 30 g 0   No current facility-administered medications on file prior to visit.     Social History  Substance Use Topics  . Smoking status: Former Research scientist (life sciences)  . Smokeless tobacco: Never Used     Comment: quit 45 years ago  . Alcohol use 0.0 oz/week     Comment: Rarely, 1-2 times a year    Review of Systems  Constitutional: Negative for chills and fever.  Eyes: Negative for visual disturbance.  Respiratory: Negative for cough and shortness of breath.   Cardiovascular: Positive for leg swelling (chronic). Negative for chest pain and palpitations.  Gastrointestinal: Positive for diarrhea. Negative for abdominal pain, nausea and vomiting.  Neurological: Positive for numbness. Negative for dizziness and weakness.      Objective:    BP 130/64   Pulse 92   Temp 97.7 F (36.5 C)   Wt 133 lb 3.2 oz (60.4 kg)   SpO2 95%   BMI 24.36 kg/m  BP Readings from Last 3 Encounters:  09/15/16 130/64  06/17/16 140/86  12/27/15 110/68   Wt Readings from Last 3 Encounters:  09/15/16 133 lb 3.2 oz (60.4 kg)  06/17/16 132 lb 6.4 oz (60.1 kg)  12/27/15 128 lb 9.6 oz (58.3 kg)    Physical Exam    Constitutional: She appears well-developed and well-nourished.  Eyes: Conjunctivae are normal.  Cardiovascular: Normal rate, regular rhythm, normal heart sounds and normal pulses.   Pulmonary/Chest: Effort normal and breath sounds normal. She has no wheezes. She has no rhonchi. She has no rales.  Neurological: She is alert.  Skin: Skin is warm and dry.  Psychiatric: She has a normal mood and affect. Her speech is normal and behavior is normal. Thought content normal.  Vitals reviewed.      Assessment & Plan:  Problem List Items Addressed This Visit      Cardiovascular and Mediastinum   Atrial fibrillation (HCC) (Chronic)    HR today 92 ( prior 89). Advised may continue on 25mg  metoprolol as long as HR stays stable. ON coumadin. INR has been at goal. Follows with cardiology for a fib and pacemaker surveillance. Will continue to follow.         Digestive   Chronic diarrhea (Chronic)    Stable. Continue current regimen        Other   Hypokalemia - Primary    History of. Unable to tolerate PO KCl. Pending cmp today      Relevant Orders   Comprehensive metabolic panel   Leg numbness    Would like to try topical therapy at this time. Folate and b12 normal. Advised capsacin. Will let me know if not better          I am having Ms. Plemmons maintain her cholecalciferol, Hypromellose, lidocaine, cyanocobalamin, hydrocortisone-pramoxine, triamcinolone cream, estradiol, diltiazem, HYDROcodone-acetaminophen, ondansetron, methylPREDNISolone, COUMADIN, metoprolol, and diphenoxylate-atropine.   No orders of the defined types were placed in this encounter.   Return precautions given.   Risks, benefits, and alternatives of the medications and treatment plan prescribed today were discussed, and patient expressed understanding.   Education regarding symptom management and diagnosis given to patient on AVS.  Continue to follow with Mable Paris, FNP for routine health  maintenance.   [name] and I agreed with plan.   Mable [place], FNP

## 2016-09-17 ENCOUNTER — Telehealth: Payer: Self-pay | Admitting: *Deleted

## 2016-09-17 LAB — POCT INR: INR: 2.3 — AB (ref <=1.1)

## 2016-09-17 NOTE — Telephone Encounter (Signed)
Please see lab result note.

## 2016-09-17 NOTE — Telephone Encounter (Signed)
Patient requested lab results  Pt contact [phone]

## 2016-09-23 ENCOUNTER — Telehealth: Payer: Self-pay | Admitting: *Deleted

## 2016-09-23 NOTE — Telephone Encounter (Addendum)
Reason for call: UTI Symptoms: decreased urine output yesterday am 3-4, urinary frequency last night, dysuria, no back pain Duration 1 day Medications: Last seen for this problem: Seen by: Encouraged to push fluids today appointment scheduled for Wednesday.

## 2016-09-23 NOTE — Telephone Encounter (Signed)
Patient feels as if she has a UTI, she requested to drop off a urine sample. Please advise Pt contact [phone]

## 2016-09-24 ENCOUNTER — Ambulatory Visit (INDEPENDENT_AMBULATORY_CARE_PROVIDER_SITE_OTHER): Payer: Medicare Other | Admitting: Family

## 2016-09-24 ENCOUNTER — Encounter: Payer: Self-pay | Admitting: Family

## 2016-09-24 VITALS — BP 130/70 | HR 88 | Temp 97.8°F | Ht 62.0 in | Wt 133.8 lb

## 2016-09-24 DIAGNOSIS — R300 Dysuria: Secondary | ICD-10-CM | POA: Diagnosis not present

## 2016-09-24 DIAGNOSIS — K529 Noninfective gastroenteritis and colitis, unspecified: Secondary | ICD-10-CM | POA: Diagnosis not present

## 2016-09-24 LAB — POCT URINALYSIS DIP (MANUAL ENTRY)
Bilirubin, UA: NEGATIVE
Glucose, UA: NEGATIVE mg/dL
Ketones, POC UA: NEGATIVE mg/dL
Nitrite, UA: NEGATIVE
Protein Ur, POC: 30 mg/dL — AB
Spec Grav, UA: 1.01 (ref 1.010–1.025)
Urobilinogen, UA: 0.2 E.U./dL
pH, UA: 5.5 (ref 5.0–8.0)

## 2016-09-24 LAB — URINALYSIS, MICROSCOPIC ONLY

## 2016-09-24 MED ORDER — DIPHENOXYLATE-ATROPINE 2.5-0.025 MG PO TABS: ORAL_TABLET | ORAL | 0 refills | Status: DC

## 2016-09-24 NOTE — Progress Notes (Signed)
Subjective:    Patient ID: DATRA CLARY, female    DOB: 09-15-1922, 83 y.o.   MRN: [id number]  CC: Kara [name] is a 83 y.o. female who presents today for an acute visit.    HPI: CC: dysuria intermittent for 3-4 days, waxing and waning.  Notes frequency.  No fever, hematuria, flank pain, N, V.   No recent UTI.   Would like refill of lomotil    HISTORY:  Past Medical History:  Diagnosis Date  . Allergy   . Atrial fibrillation ([place])   . CAD (coronary artery disease)   . Cancer (HCC)    UTERINE  . Chronic anticoagulation   . Chronic diarrhea   . Decubitus ulcer    sacral region  . Diabetes ([place])    diet controlled  . Dysuria   . Edema of lower extremity    mainly right foot, slightly in left foot.  . Fibrocystic breast disease   . GERD (gastroesophageal reflux disease)   . Glaucoma   . Glaucoma   . Hematuria   . Hemorrhoids   . History of colon polyps   . History of pancreatitis   . Hyperlipidemia   . Hypertension   . Hypokalemia   . IBS (irritable bowel syndrome)   . Microscopic hematuria   . Mitral valve regurgitation   . Osteoarthritis    fingers  . Pernicious anemia   . Plantar fasciitis   . Recurrent UTI   . Skin cancer   . Sleep apnea, obstructive   . Vaginal atrophy   . Vitamin D deficiency   . Yeast vaginitis    Past Surgical History:  Procedure Laterality Date  . ABDOMINAL HYSTERECTOMY  1980's  . ABDOMINAL SURGERY     for villous polyp,,,many years ago  . APPENDECTOMY  1940's  . ASCAD, s/p PTCA  11/28/2005   MID LESION   . BREAST BIOPSY Left 1970's  . CARPAL TUNNEL RELEASE Right 12/13/2015   Procedure: CARPAL TUNNEL RELEASE;  Surgeon: Thornton Park, MD;  Location: [facility] ORS;  Service: Orthopedics;  Laterality: Right;  . COLECTOMY    . PACEMAKER PLACEMENT    . radation     for uterine cance  . REFRACTIVE SURGERY     for bilateral glaumoma  . TONSILLECTOMY  1936   Family History  Problem Relation Age of Onset  . Coronary artery  disease Father   . Kidney disease Father   . Cancer Sister   . Diabetes    . Cancer Mother     COLON  . Bladder Cancer Neg Hx   . Breast cancer Neg Hx     Allergies: Baycol [cerivastatin sodium]; Cardizem [diltiazem hcl]; Crestor [rosuvastatin calcium]; Dronedarone; Metoprolol tartrate; Omeprazole; Pravachol; Statins; Vioxx [rofecoxib]; and Zocor [simvastatin] Current Outpatient Prescriptions on File Prior to Visit  Medication Sig Dispense Refill  . cholecalciferol (VITAMIN D) 1000 UNITS tablet Take 1,000 Units by mouth daily.    Marland Kitchen COUMADIN 1 MG tablet TAKE ONE TABLET BY MOUTH DAILY AT 6PM 45 tablet 0  . cyanocobalamin (,VITAMIN B-12,) 1000 MCG/ML injection INJECT 1 ML  INTO THE MUSCLE EVERY 30 DAYS. 1 mL 10  . diltiazem (CARDIZEM SR) 120 MG 12 hr capsule Take 120 mg by mouth every morning.    . diphenoxylate-atropine (LOMOTIL) 2.5-0.025 MG tablet TAKE 1 TABLET TWO TIMES DAILY AS NEEDED FOR DIARHREA 90 tablet 0  . estradiol (ESTRACE) 0.1 MG/GM vaginal cream Place 1 Applicatorful vaginally at bedtime. (Patient taking differently:  Place 1 Applicatorful vaginally daily as needed. ) 42.5 g 0  . HYDROcodone-acetaminophen (NORCO) 5-325 MG tablet Take 1-2 tablets by mouth every 4 (four) hours as needed for moderate pain. MAXIMUM TOTAL ACETAMINOPHEN DOSE IS 4000 MG PER DAY 40 tablet 0  . hydrocortisone-pramoxine (ANALPRAM-HC) 2.5-1 % rectal cream APPLY RECTALLY THREE TIMES DAILY (Patient taking differently: APPLY RECTALLY THREE TIMES DAILY AS NEEDED) 30 g 0  . Hypromellose (GENTEAL) 0.3 % SOLN Place 1 drop into both eyes daily as needed (dryness).     Marland Kitchen lidocaine (LIDODERM) 5 % Place 1 patch onto the skin daily. Remove & Discard patch within 12 hours or as directed by MD 30 patch 0  . methylPREDNISolone (MEDROL DOSEPAK) 4 MG TBPK tablet   0  . metoprolol (LOPRESSOR) 50 MG tablet 25mg  in the morning and 25mg  in the evening 60 tablet 4  . ondansetron (ZOFRAN) 4 MG tablet Take 1 tablet (4 mg total) by  mouth every 8 (eight) hours as needed for nausea or vomiting. 30 tablet 0  . triamcinolone cream (KENALOG) 0.1 % Apply 1 application topically 2 (two) times daily. (Patient taking differently: Apply 1 application topically 2 (two) times daily as needed. ) 30 g 0   No current facility-administered medications on file prior to visit.     Social History  Substance Use Topics  . Smoking status: Former Research scientist (life sciences)  . Smokeless tobacco: Never Used     Comment: quit 45 years ago  . Alcohol use 0.0 oz/week     Comment: Rarely, 1-2 times a year    Review of Systems  Constitutional: Negative for chills and fever.  Respiratory: Negative for cough.   Cardiovascular: Negative for chest pain and palpitations.  Gastrointestinal: Negative for nausea and vomiting.  Genitourinary: Positive for dysuria and frequency. Negative for hematuria and urgency.      Objective:    BP 130/70   Pulse 88   Temp 97.8 F (36.6 C) (Oral)   Ht 5\' 2"  (1.575 m)   Wt 133 lb 12.8 oz (60.7 kg)   SpO2 94%   BMI 24.47 kg/m    Physical Exam  Constitutional: She appears well-developed and well-nourished.  Cardiovascular: Normal rate, regular rhythm, normal heart sounds and normal pulses.   Pulmonary/Chest: Effort normal and breath sounds normal. She has no wheezes. She has no rhonchi. She has no rales.  Abdominal: There is no CVA tenderness.  Neurological: She is alert.  Skin: Skin is warm and dry.  Psychiatric: She has a normal mood and affect. Her speech is normal and behavior is normal. Thought content normal.  Vitals reviewed.      Assessment & Plan:   1. Dysuria Blood and leukocytes seen in urine. Patient and I jointly agreed to await urine culture. - POCT urinalysis dipstick - Urine Culture - Urine Microscopic Only  2. Chronic diarrhea Refilled as requested.   - diphenoxylate-atropine (LOMOTIL) 2.5-0.025 MG tablet; TAKE 1 TABLET TWO TIMES DAILY AS NEEDED FOR DIARHREA  Dispense: 90 tablet; Refill:  0    I am having Ms. Swartzendruber maintain her cholecalciferol, Hypromellose, lidocaine, cyanocobalamin, hydrocortisone-pramoxine, triamcinolone cream, estradiol, diltiazem, HYDROcodone-acetaminophen, ondansetron, methylPREDNISolone, COUMADIN, metoprolol, and diphenoxylate-atropine.   No orders of the defined types were placed in this encounter.   Return precautions given.   Risks, benefits, and alternatives of the medications and treatment plan prescribed today were discussed, and patient expressed understanding.   Education regarding symptom management and diagnosis given to patient on AVS.  Continue to follow  with Burnard Hawthorne, FNP for routine health maintenance.   [name] and I agreed with plan.   Mable [place], FNP

## 2016-09-24 NOTE — Progress Notes (Signed)
Pre visit review using our clinic review tool, if applicable. No additional management support is needed unless otherwise documented below in the visit note. 

## 2016-09-24 NOTE — Patient Instructions (Addendum)
Capsacin gel or cream - may try for feet numbness, tingling. -- this is from prior note   Plenty of water  Lets await urine culture

## 2016-09-25 DIAGNOSIS — Z7901 Long term (current) use of anticoagulants: Secondary | ICD-10-CM | POA: Diagnosis not present

## 2016-09-25 LAB — POCT INR: INR: 2.4 — AB (ref 0.9–1.1)

## 2016-09-26 LAB — URINE CULTURE

## 2016-09-27 ENCOUNTER — Telehealth: Payer: Self-pay | Admitting: Family

## 2016-09-27 DIAGNOSIS — N3000 Acute cystitis without hematuria: Secondary | ICD-10-CM

## 2016-09-27 MED ORDER — AMOXICILLIN-POT CLAVULANATE 875-125 MG PO TABS: 1.0000 | ORAL_TABLET | Freq: Two times a day (BID) | ORAL | 0 refills | Status: AC

## 2016-09-27 NOTE — Telephone Encounter (Signed)
Spoke with pt Informed her of uti and new rx  Afebrile. No dysuria

## 2016-09-29 NOTE — Telephone Encounter (Signed)
Patient calls stating started augmentin yesterday just had 3 doses as today.   She states forehead feels warm and legs ache and low back is bothering her just came on suddenly, still some dysuria.   Please advise.

## 2016-09-29 NOTE — Telephone Encounter (Signed)
Call  Please take temperature with a thermometer  Low back pain can be sign of UTI. Is pain severe or one sided?   I do not think these are side effects of augmentin.Marland Kitchen However may be sign of worsening  Uti.   Does she want to come in tomorrow to be seen?

## 2016-09-29 NOTE — Telephone Encounter (Signed)
Spoke with patient states her thermometers are old , probably don't work, pain in back isn't on one specific side of back just low back.   Back pain has eased of some.   Appointment scheduled for tomorrow If symptoms worsen she will go to ER or go to acute care.

## 2016-09-29 NOTE — Telephone Encounter (Signed)
Pt called and stated that she started taking antibiotics yesterday at noon. Pt states that she is feeling bad and that her lower back hurts. Please advise, thank you!  Call pt @ [phone]

## 2016-09-30 ENCOUNTER — Encounter: Payer: Self-pay | Admitting: Family

## 2016-09-30 ENCOUNTER — Ambulatory Visit (INDEPENDENT_AMBULATORY_CARE_PROVIDER_SITE_OTHER): Payer: Medicare Other | Admitting: Family

## 2016-09-30 VITALS — BP 134/68 | HR 86 | Temp 98.0°F | Ht 62.0 in | Wt 134.0 lb

## 2016-09-30 DIAGNOSIS — R300 Dysuria: Secondary | ICD-10-CM

## 2016-09-30 LAB — POCT URINALYSIS DIPSTICK
Bilirubin, UA: NEGATIVE
Glucose, UA: NEGATIVE
Ketones, UA: NEGATIVE
Nitrite, UA: NEGATIVE
Protein, UA: NEGATIVE
Spec Grav, UA: <=1.005 — AB (ref 1.010–1.025)
Urobilinogen, UA: 0.2 E.U./dL
pH, UA: 6.5 (ref 5.0–8.0)

## 2016-09-30 LAB — URINALYSIS, MICROSCOPIC ONLY

## 2016-09-30 LAB — POCT INR: INR: 2 — AB (ref 0.9–1.1)

## 2016-09-30 NOTE — Progress Notes (Signed)
Pre visit review using our clinic review tool, if applicable. No additional management support is needed unless otherwise documented below in the visit note. 

## 2016-09-30 NOTE — Patient Instructions (Addendum)
Check INR today  Pleased to see you are doing better  Let me know if symptoms recur or you don't conitnue to feel better.   p

## 2016-09-30 NOTE — Progress Notes (Signed)
Subjective:    Patient ID: Kara [name], female    DOB: 13-Nov-1922, 83 y.o.   MRN: [id number]  CC: Kara [name] is a 83 y.o. female who presents today for an acute visit.    HPI: CC: low back pain x one day.  'not excuriating but 'uncomfortable' however improved today. She continues to have dysuria. She also describes 'pain in legs.'  Back pain improved when laying down. Does not a lot of walking these past several days with daughter visiting.  Started augmentin 2 days ago and had 4 doses.   No fever, chills.   'almost canceled appointment' this morning. Back pain has resolved and urine 'looks clearer' than it had been.    Checks INR on Wednesday or Thursday. No bleeding.     HISTORY:  Past Medical History:  Diagnosis Date  . Allergy   . Atrial fibrillation ([place])   . CAD (coronary artery disease)   . Cancer (HCC)    UTERINE  . Chronic anticoagulation   . Chronic diarrhea   . Decubitus ulcer    sacral region  . Diabetes ([place])    diet controlled  . Dysuria   . Edema of lower extremity    mainly right foot, slightly in left foot.  . Fibrocystic breast disease   . GERD (gastroesophageal reflux disease)   . Glaucoma   . Glaucoma   . Hematuria   . Hemorrhoids   . History of colon polyps   . History of pancreatitis   . Hyperlipidemia   . Hypertension   . Hypokalemia   . IBS (irritable bowel syndrome)   . Microscopic hematuria   . Mitral valve regurgitation   . Osteoarthritis    fingers  . Pernicious anemia   . Plantar fasciitis   . Recurrent UTI   . Skin cancer   . Sleep apnea, obstructive   . Vaginal atrophy   . Vitamin D deficiency   . Yeast vaginitis    Past Surgical History:  Procedure Laterality Date  . ABDOMINAL HYSTERECTOMY  1980's  . ABDOMINAL SURGERY     for villous polyp,,,many years ago  . APPENDECTOMY  1940's  . ASCAD, s/p PTCA  11/28/2005   MID LESION   . BREAST BIOPSY Left 1970's  . CARPAL TUNNEL RELEASE Right 12/13/2015   Procedure: CARPAL  TUNNEL RELEASE;  Surgeon: Thornton Park, MD;  Location: [facility] ORS;  Service: Orthopedics;  Laterality: Right;  . COLECTOMY    . PACEMAKER PLACEMENT    . radation     for uterine cance  . REFRACTIVE SURGERY     for bilateral glaumoma  . TONSILLECTOMY  1936   Family History  Problem Relation Age of Onset  . Coronary artery disease Father   . Kidney disease Father   . Cancer Sister   . Diabetes Unknown   . Cancer Mother        COLON  . Bladder Cancer Neg Hx   . Breast cancer Neg Hx     Allergies: Baycol [cerivastatin sodium]; Cardizem [diltiazem hcl]; Crestor [rosuvastatin calcium]; Dronedarone; Metoprolol tartrate; Omeprazole; Pravachol; Statins; Vioxx [rofecoxib]; and Zocor [simvastatin] Current Outpatient Prescriptions on File Prior to Visit  Medication Sig Dispense Refill  . amoxicillin-clavulanate (AUGMENTIN) 875-125 MG tablet Take 1 tablet by mouth 2 (two) times daily. 14 tablet 0  . cholecalciferol (VITAMIN D) 1000 UNITS tablet Take 1,000 Units by mouth daily.    Marland Kitchen COUMADIN 1 MG tablet TAKE ONE TABLET BY MOUTH DAILY  AT 6PM 45 tablet 0  . cyanocobalamin (,VITAMIN B-12,) 1000 MCG/ML injection INJECT 1 ML  INTO THE MUSCLE EVERY 30 DAYS. 1 mL 10  . diltiazem (CARDIZEM SR) 120 MG 12 hr capsule Take 120 mg by mouth every morning.    . diphenoxylate-atropine (LOMOTIL) 2.5-0.025 MG tablet TAKE 1 TABLET TWO TIMES DAILY AS NEEDED FOR DIARHREA 90 tablet 0  . estradiol (ESTRACE) 0.1 MG/GM vaginal cream Place 1 Applicatorful vaginally at bedtime. (Patient taking differently: Place 1 Applicatorful vaginally daily as needed. ) 42.5 g 0  . HYDROcodone-acetaminophen (NORCO) 5-325 MG tablet Take 1-2 tablets by mouth every 4 (four) hours as needed for moderate pain. MAXIMUM TOTAL ACETAMINOPHEN DOSE IS 4000 MG PER DAY 40 tablet 0  . hydrocortisone-pramoxine (ANALPRAM-HC) 2.5-1 % rectal cream APPLY RECTALLY THREE TIMES DAILY (Patient taking differently: APPLY RECTALLY THREE TIMES DAILY AS NEEDED) 30  g 0  . Hypromellose (GENTEAL) 0.3 % SOLN Place 1 drop into both eyes daily as needed (dryness).     Marland Kitchen lidocaine (LIDODERM) 5 % Place 1 patch onto the skin daily. Remove & Discard patch within 12 hours or as directed by MD 30 patch 0  . methylPREDNISolone (MEDROL DOSEPAK) 4 MG TBPK tablet   0  . metoprolol (LOPRESSOR) 50 MG tablet 25mg  in the morning and 25mg  in the evening 60 tablet 4  . ondansetron (ZOFRAN) 4 MG tablet Take 1 tablet (4 mg total) by mouth every 8 (eight) hours as needed for nausea or vomiting. 30 tablet 0  . triamcinolone cream (KENALOG) 0.1 % Apply 1 application topically 2 (two) times daily. (Patient taking differently: Apply 1 application topically 2 (two) times daily as needed. ) 30 g 0   No current facility-administered medications on file prior to visit.     Social History  Substance Use Topics  . Smoking status: Former Research scientist (life sciences)  . Smokeless tobacco: Never Used     Comment: quit 45 years ago  . Alcohol use 0.0 oz/week     Comment: Rarely, 1-2 times a year    Review of Systems  Constitutional: Negative for chills and fever.  Respiratory: Negative for cough.   Cardiovascular: Negative for chest pain and palpitations.  Gastrointestinal: Negative for nausea and vomiting.  Genitourinary: Positive for dysuria.  Musculoskeletal: Positive for back pain.      Objective:    There were no vitals taken for this visit.   Physical Exam  Constitutional: She appears well-developed and well-nourished.  Eyes: Conjunctivae are normal.  Cardiovascular: Normal rate, regular rhythm, normal heart sounds and normal pulses.   Pulmonary/Chest: Effort normal and breath sounds normal. She has no wheezes. She has no rhonchi. She has no rales.  Musculoskeletal:       Lumbar back: She exhibits normal range of motion, no tenderness, no bony tenderness, no swelling, no edema, no pain and no spasm.       Back:  Full range of motion with flexion, tension, lateral side bends. No bony  tenderness. No pain, numbness, tingling elicited with single leg raise bilaterally.   Area noted on diagram where patient reports pain.   Neurological: She is alert. She has normal strength. No sensory deficit.  Reflex Scores:      Patellar reflexes are 2+ on the right side and 2+ on the left side. Sensation and strength intact bilateral lower extremities.  Skin: Skin is warm and dry.  Psychiatric: She has a normal mood and affect. Her speech is normal and behavior is normal. Thought  content normal.  Vitals reviewed.      Assessment & Plan:   1. Dysuria Afebrile. Patient is well-appearing and back pain has largely resolved today.UA continues to show leukocytes. Pending UC.  We jointly agreed the symptoms do appear to be improving and she will stay on Augmentin and keep me informed of any new symptoms or symptoms ( such as low back pain) recur.   She will also check her INR today.   - POCT Urinalysis Dipstick - Urine Culture - Urine Microscopic; Future - Urine Microscopic    I am having Ms. Boehme maintain her cholecalciferol, Hypromellose, lidocaine, cyanocobalamin, hydrocortisone-pramoxine, triamcinolone cream, estradiol, diltiazem, HYDROcodone-acetaminophen, ondansetron, methylPREDNISolone, COUMADIN, metoprolol tartrate, diphenoxylate-atropine, and amoxicillin-clavulanate.   No orders of the defined types were placed in this encounter.   Return precautions given.   Risks, benefits, and alternatives of the medications and treatment plan prescribed today were discussed, and patient expressed understanding.   Education regarding symptom management and diagnosis given to patient on AVS.  Continue to follow with Burnard Hawthorne, FNP for routine health maintenance.   [name] and I agreed with plan.   Mable Paris, FNP

## 2016-10-01 LAB — URINE CULTURE: Organism ID, Bacteria: NO GROWTH

## 2016-10-01 LAB — POCT INR: INR: 1.5 — AB (ref <=1.1)

## 2016-10-03 ENCOUNTER — Telehealth: Payer: Self-pay | Admitting: *Deleted

## 2016-10-03 DIAGNOSIS — I4891 Unspecified atrial fibrillation: Secondary | ICD-10-CM

## 2016-10-03 DIAGNOSIS — B379 Candidiasis, unspecified: Secondary | ICD-10-CM

## 2016-10-03 NOTE — Telephone Encounter (Signed)
Patient will complete antibiotic on 10/04/16 and has requested diflucan . Patient stated that she has a slight itch that is resulting a yeast infection from the antibiotic.  Pharmacy [facility]

## 2016-10-03 NOTE — Telephone Encounter (Signed)
Yes, okay to send diflucan

## 2016-10-06 ENCOUNTER — Other Ambulatory Visit: Payer: Self-pay | Admitting: Family

## 2016-10-06 MED ORDER — FLUCONAZOLE 150 MG PO TABS: 150.0000 mg | ORAL_TABLET | Freq: Once | ORAL | 1 refills | Status: AC

## 2016-10-06 MED ORDER — DILTIAZEM HCL ER 120 MG PO CP12: 120.0000 mg | ORAL_CAPSULE | ORAL | 1 refills | Status: DC

## 2016-10-06 MED ORDER — WARFARIN SODIUM 1 MG PO TABS: ORAL_TABLET | ORAL | 2 refills | Status: DC

## 2016-10-06 NOTE — Telephone Encounter (Signed)
Pt called requesting an update on this medication along with needing a refill on her diltiazem (CARDIZEM SR) 120 MG 12 hr capsule and COUMADIN 1 MG tablet. Please advise, thank you!  [place], [place]  Call pt @ [phone]

## 2016-10-06 NOTE — Telephone Encounter (Signed)
Patient notified and voiced understanding.

## 2016-10-06 NOTE — Telephone Encounter (Signed)
Call pt  Diflucan sent.   This may cause INR to increase on coumadin. We need to be very vigilant.   Any signs of bleeding , let me know

## 2016-10-06 NOTE — Telephone Encounter (Signed)
I sent prescriptions in to [facility]  Please let pt know

## 2016-10-08 ENCOUNTER — Encounter: Payer: Self-pay | Admitting: Family

## 2016-10-08 LAB — PROTIME-INR

## 2016-10-09 ENCOUNTER — Telehealth: Payer: Self-pay | Admitting: Radiology

## 2016-10-09 ENCOUNTER — Telehealth: Payer: Self-pay | Admitting: Family

## 2016-10-09 DIAGNOSIS — L708 Other acne: Secondary | ICD-10-CM | POA: Diagnosis not present

## 2016-10-09 DIAGNOSIS — L218 Other seborrheic dermatitis: Secondary | ICD-10-CM | POA: Diagnosis not present

## 2016-10-09 DIAGNOSIS — L0889 Other specified local infections of the skin and subcutaneous tissue: Secondary | ICD-10-CM | POA: Diagnosis not present

## 2016-10-09 DIAGNOSIS — L658 Other specified nonscarring hair loss: Secondary | ICD-10-CM | POA: Diagnosis not present

## 2016-10-09 DIAGNOSIS — L821 Other seborrheic keratosis: Secondary | ICD-10-CM | POA: Diagnosis not present

## 2016-10-09 DIAGNOSIS — L650 Telogen effluvium: Secondary | ICD-10-CM | POA: Diagnosis not present

## 2016-10-09 DIAGNOSIS — B078 Other viral warts: Secondary | ICD-10-CM | POA: Diagnosis not present

## 2016-10-09 NOTE — Telephone Encounter (Signed)
See prev message

## 2016-10-09 NOTE — Telephone Encounter (Signed)
Known. Pt notified

## 2016-10-09 NOTE — Telephone Encounter (Signed)
Call pt  inr low  Did she miss dose?  In past she has taken HALF tablet if low and she missed dose.   She would need to be vigilant for bleeding if takes half tab  Repeat inr next week

## 2016-10-09 NOTE — Telephone Encounter (Signed)
Patient missed a dose and will take 0.5 mg coumadin extra one time dose and repeat inr next Wednesday.

## 2016-10-09 NOTE — Telephone Encounter (Signed)
Michael for [facility] called for critical INR. Pt INR was done yesterday 10/08/16 evening and resulted at 1.5

## 2016-10-14 ENCOUNTER — Ambulatory Visit (INDEPENDENT_AMBULATORY_CARE_PROVIDER_SITE_OTHER): Payer: Medicare Other

## 2016-10-14 ENCOUNTER — Other Ambulatory Visit: Payer: Self-pay | Admitting: Family

## 2016-10-14 VITALS — HR 78 | Temp 98.1°F | Resp 12 | Ht 62.0 in | Wt 129.8 lb

## 2016-10-14 DIAGNOSIS — Z0000 Encounter for general adult medical examination without abnormal findings: Secondary | ICD-10-CM

## 2016-10-14 DIAGNOSIS — Z1331 Encounter for screening for depression: Secondary | ICD-10-CM

## 2016-10-14 DIAGNOSIS — Z1389 Encounter for screening for other disorder: Secondary | ICD-10-CM | POA: Diagnosis not present

## 2016-10-14 DIAGNOSIS — B379 Candidiasis, unspecified: Secondary | ICD-10-CM

## 2016-10-14 MED ORDER — FLUCONAZOLE 150 MG PO TABS: 150.0000 mg | ORAL_TABLET | Freq: Once | ORAL | 1 refills | Status: AC

## 2016-10-14 NOTE — Progress Notes (Signed)
Subjective:   Kara [name] is a 83 y.o. female who presents for Medicare Annual (Subsequent) preventive examination.  Review of Systems:  No ROS.  Medicare Wellness Visit.  Cardiac Risk Factors include: advanced age (>62men, >74 women);diabetes mellitus;hypertension     Objective:     Vitals: Pulse 78   Temp 98.1 F (36.7 C) (Oral)   Resp 12   Ht 5\' 2"  (1.575 m)   Wt 129 lb 12.8 oz (58.9 kg)   SpO2 97%   BMI 23.74 kg/m   Body mass index is 23.74 kg/m.   Tobacco History  Smoking Status  . Former Smoker  Smokeless Tobacco  . Never Used    Comment: quit 45 years ago     Counseling given: Not Answered   Past Medical History:  Diagnosis Date  . Allergy   . Atrial fibrillation ([place])   . CAD (coronary artery disease)   . Cancer (HCC)    UTERINE  . Chronic anticoagulation   . Chronic diarrhea   . Decubitus ulcer    sacral region  . Diabetes ([place])    diet controlled  . Dysuria   . Edema of lower extremity    mainly right foot, slightly in left foot.  . Fibrocystic breast disease   . GERD (gastroesophageal reflux disease)   . Glaucoma   . Glaucoma   . Hematuria   . Hemorrhoids   . History of colon polyps   . History of pancreatitis   . Hyperlipidemia   . Hypertension   . Hypokalemia   . IBS (irritable bowel syndrome)   . Microscopic hematuria   . Mitral valve regurgitation   . Osteoarthritis    fingers  . Pernicious anemia   . Plantar fasciitis   . Recurrent UTI   . Skin cancer   . Sleep apnea, obstructive   . Vaginal atrophy   . Vitamin D deficiency   . Yeast vaginitis    Past Surgical History:  Procedure Laterality Date  . ABDOMINAL HYSTERECTOMY  1980's  . ABDOMINAL SURGERY     for villous polyp,,,many years ago  . APPENDECTOMY  1940's  . ASCAD, s/p PTCA  11/28/2005   MID LESION   . BREAST BIOPSY Left 1970's  . CARPAL TUNNEL RELEASE Right 12/13/2015   Procedure: CARPAL TUNNEL RELEASE;  Surgeon: Thornton Park, MD;  Location: [facility] ORS;   Service: Orthopedics;  Laterality: Right;  . COLECTOMY    . PACEMAKER PLACEMENT    . radation     for uterine cance  . REFRACTIVE SURGERY     for bilateral glaumoma  . TONSILLECTOMY  1936   Family History  Problem Relation Age of Onset  . Coronary artery disease Father   . Kidney disease Father   . Cancer Sister   . Diabetes Unknown   . Cancer Mother        COLON  . Bladder Cancer Neg Hx   . Breast cancer Neg Hx    History  Sexual Activity  . Sexual activity: No    Outpatient Encounter Prescriptions as of 10/14/2016  Medication Sig  . cholecalciferol (VITAMIN D) 1000 UNITS tablet Take 1,000 Units by mouth daily.  . cyanocobalamin (,VITAMIN B-12,) 1000 MCG/ML injection INJECT 1 ML  INTO THE MUSCLE EVERY 30 DAYS.  Marland Kitchen diltiazem (CARDIZEM SR) 120 MG 12 hr capsule Take 1 capsule (120 mg total) by mouth every morning.  . diphenoxylate-atropine (LOMOTIL) 2.5-0.025 MG tablet TAKE 1 TABLET TWO TIMES DAILY AS  NEEDED FOR DIARHREA  . estradiol (ESTRACE) 0.1 MG/GM vaginal cream Place 1 Applicatorful vaginally at bedtime. (Patient taking differently: Place 1 Applicatorful vaginally daily as needed. )  . HYDROcodone-acetaminophen (NORCO) 5-325 MG tablet Take 1-2 tablets by mouth every 4 (four) hours as needed for moderate pain. MAXIMUM TOTAL ACETAMINOPHEN DOSE IS 4000 MG PER DAY  . hydrocortisone-pramoxine (ANALPRAM-HC) 2.5-1 % rectal cream APPLY RECTALLY THREE TIMES DAILY (Patient taking differently: APPLY RECTALLY THREE TIMES DAILY AS NEEDED)  . Hypromellose (GENTEAL) 0.3 % SOLN Place 1 drop into both eyes daily as needed (dryness).   Marland Kitchen lidocaine (LIDODERM) 5 % Place 1 patch onto the skin daily. Remove & Discard patch within 12 hours or as directed by MD  . methylPREDNISolone (MEDROL DOSEPAK) 4 MG TBPK tablet   . metoprolol (LOPRESSOR) 50 MG tablet 25mg  in the morning and 25mg  in the evening  . ondansetron (ZOFRAN) 4 MG tablet Take 1 tablet (4 mg total) by mouth every 8 (eight) hours as needed  for nausea or vomiting.  . triamcinolone cream (KENALOG) 0.1 % Apply 1 application topically 2 (two) times daily. (Patient taking differently: Apply 1 application topically 2 (two) times daily as needed. )  . warfarin (COUMADIN) 1 MG tablet TAKE ONE TABLET BY MOUTH DAILY AT 6PM   No facility-administered encounter medications on file as of 10/14/2016.     Activities of Daily Living In your present state of health, do you have any difficulty performing the following activities: 10/14/2016 12/12/2015  Hearing? N N  Vision? N N  Difficulty concentrating or making decisions? Y N  Walking or climbing stairs? Y Y  Dressing or bathing? N N  Doing errands, shopping? N N  Preparing Food and eating ? N -  Using the Toilet? N -  In the past six months, have you accidently leaked urine? Y -  Do you have problems with loss of bowel control? Y -  Managing your Medications? N -  Managing your Finances? N -  Housekeeping or managing your Housekeeping? Y -  Some recent data might be hidden    Patient Care Team: Burnard Hawthorne, FNP as PCP - General (Family Medicine)    Assessment:    This is a routine wellness examination for Kara [name]. The goal of the wellness visit is to assist the patient how to close the gaps in care and create a preventative care plan for the patient.   Taking calcium VIT D as appropriate/Osteoporosis risk reviewed.  Medications reviewed; taking without issues or barriers.  Safety issues reviewed; smoke detectors in the home. No firearms in the home.  Wears seatbelts when driving or riding with others. Patient does wear sunscreen or protective clothing when in direct sunlight. No violence in the home.  Depression- PHQ 2 &9 complete.  No signs/symptoms or verbal communication regarding little pleasure in doing things, feeling down, depressed or hopeless. No changes in sleeping, energy, eating, concentrating.  No thoughts of self harm or harm towards others.  Time spent on  this topic is 8 minutes.   Patient is alert, normal appearance, oriented to person/place/and time. Correctly identified the president of the [place], recall of 3/3 words, and performing simple calculations.  Patient displays appropriate judgement and can read correct time from watch face.  No new identified risk were noted.  No failures at ADL's or IADL's.   BMI- discussed the importance of a healthy diet, water intake and exercise. Educational material provided.   Daily fluid intake: 1 cups of  caffeine,  2 cups of water  HTN- followed by PCP.  Dental- every six months. Dr. Lynelle Smoke.  Eye- Visual acuity not assessed per patient preference since they have regular follow up with the ophthalmologist.  Wears corrective lenses.  Sleep patterns- Sleeps 6 hours at night.  Nocturia x3. Wakes feeling rested. Naps during the day.  Health maintenance gaps- closed.  Patient Concerns: Continued vaginal itching.  Last seen for UTI 09/30/16.  Deferred to PCP for follow up, medication sent to pharmacy. Verbalized understanding of the need to monitor INR as directed.  Contact PCP if symptoms persist or worsen.    Exercise Activities and Dietary recommendations Current Exercise Habits: Home exercise routine, Type of exercise: walking, Time (Minutes): 15, Frequency (Times/Week): 4, Weekly Exercise (Minutes/Week): 60, Intensity: Mild  Goals    . Increase physical activity          Stay active and walk for exercises     . Increase water intake          Stay hydrated! Drink plenty of water.      Fall Risk Fall Risk  10/14/2016 09/15/2016 06/17/2016 12/27/2015 11/22/2015  Falls in the past year? Yes Yes No No No  Number falls in past yr: 1 1 - - -  Injury with Fall? No No - - -  Follow up Falls prevention discussed;Education provided - - - -   Depression Screen PHQ 2/9 Scores 10/14/2016 09/15/2016 06/17/2016 12/27/2015  PHQ - 2 Score 0 0 0 0  PHQ- 9 Score 0 - - -     Cognitive Function MMSE - Mini Mental  State Exam 08/01/2015  Orientation to time 5  Orientation to Place 5  Registration 3  Attention/ Calculation 5  Recall 3  Language- name 2 objects 2  Language- repeat 1  Language- follow 3 step command 3  Language- read & follow direction 1  Write a sentence 1  Copy design 1  Total score 30     6CIT Screen 10/14/2016  What Year? 0 points  What month? 0 points  What time? 0 points  Count back from 20 0 points  Months in reverse 0 points  Repeat phrase 0 points  Total Score 0    Immunization History  Administered Date(s) Administered  . Influenza Whole 02/28/2013  . Influenza,inj,Quad PF,36+ Mos 01/29/2015  . Influenza-Unspecified 03/17/2012, 03/02/2014, 02/09/2016  . Pneumococcal Conjugate-13 09/01/2013  . Pneumococcal-Unspecified 04/15/2007  . Tdap 11/11/2011  . Zoster 10/12/2011   Screening Tests Health Maintenance  Topic Date Due  . INFLUENZA VACCINE  12/17/2016  . TETANUS/TDAP  11/10/2021  . DEXA SCAN  Completed  . PNA vac Low Risk Adult  Completed      Plan:    End of life planning; Advance aging; Advanced directives discussed. Copy of current HCPOA/Living Will requested.    I have personally reviewed and noted the following in the patient's chart:   . Medical and social history . Use of alcohol, tobacco or illicit drugs  . Current medications and supplements . Functional ability and status . Nutritional status . Physical activity . Advanced directives . List of other physicians . Hospitalizations, surgeries, and ER visits in previous 12 months . Vitals . Screenings to include cognitive, depression, and falls . Referrals and appointments  In addition, I have reviewed and discussed with patient certain preventive protocols, quality metrics, and best practice recommendations. A written personalized care plan for preventive services as well as general preventive health recommendations were provided to  patient.     Varney Biles,  LPN  [id number]    Agree with plan and advise regarding empiric treatment for candidiasis.    Mable Paris, NP

## 2016-10-14 NOTE — Patient Instructions (Addendum)
  Ms. Funderburk , Thank you for taking time to come for your Medicare Wellness Visit. I appreciate your ongoing commitment to your health goals. Please review the following plan we discussed and let me know if I can assist you in the future.   Follow up with Mable Paris  FNP, as needed.    Bring a copy of your [place] and/or Living Will to be scanned into chart.  Have a great day!  These are the goals we discussed: Goals    . Increase physical activity          Stay active and walk for exercises     . Increase water intake          Stay hydrated! Drink plenty of water.       This is a list of the screening recommended for you and due dates:  Health Maintenance  Topic Date Due  . Flu Shot  12/17/2016  . Tetanus Vaccine  11/10/2021  . DEXA scan (bone density measurement)  Completed  . Pneumonia vaccines  Completed

## 2016-10-15 LAB — POCT INR: INR: 2.3 — AB (ref 0.9–1.1)

## 2016-10-20 DIAGNOSIS — I495 Sick sinus syndrome: Secondary | ICD-10-CM | POA: Diagnosis not present

## 2016-10-20 DIAGNOSIS — I480 Paroxysmal atrial fibrillation: Secondary | ICD-10-CM | POA: Diagnosis not present

## 2016-10-20 DIAGNOSIS — I272 Pulmonary hypertension, unspecified: Secondary | ICD-10-CM | POA: Diagnosis not present

## 2016-10-20 DIAGNOSIS — I10 Essential (primary) hypertension: Secondary | ICD-10-CM | POA: Diagnosis not present

## 2016-10-22 DIAGNOSIS — Z7901 Long term (current) use of anticoagulants: Secondary | ICD-10-CM | POA: Diagnosis not present

## 2016-10-22 LAB — POCT INR: INR: 3.1 — AB (ref 0.9–1.1)

## 2016-10-24 ENCOUNTER — Telehealth: Payer: Self-pay | Admitting: Family

## 2016-10-24 NOTE — Telephone Encounter (Signed)
Call pt  INR slightly increased - may have been dose of diflucan.   Very vigilant for bleeding and advise to recheck earlier ( than her usual) next week to ensure back in normal range

## 2016-10-24 NOTE — Telephone Encounter (Signed)
Patient was informed and had question, comments, or concerns at this time.

## 2016-10-29 ENCOUNTER — Encounter: Payer: Self-pay | Admitting: Family

## 2016-10-29 LAB — PROTIME-INR

## 2016-10-30 ENCOUNTER — Telehealth: Payer: Self-pay | Admitting: Family

## 2016-10-30 NOTE — Telephone Encounter (Signed)
Call pt  INR is 3.4  Has she done anything different? More leafy greens?  HOLD for 2 days.  Then start back at every other day 1 mg coumadin for ONE week.   Then recheck and resume normal dosing of 1mg  QD.  Advise to very viglant for bleeding risks.

## 2016-10-30 NOTE — Telephone Encounter (Signed)
Left message for patient to return call back.  

## 2016-10-30 NOTE — Telephone Encounter (Signed)
Spoken to patient, she stated she may have taken and extra half a pill.  Patient did say that she will comply with directions and understands the risks

## 2016-10-30 NOTE — Telephone Encounter (Signed)
Pt called back returning your call. Please advise, thank you!  Call pt @ [phone]

## 2016-11-05 ENCOUNTER — Encounter: Payer: Self-pay | Admitting: Family

## 2016-11-05 LAB — PROTIME-INR: INR: 1.4 — AB (ref 0.9–1.1)

## 2016-11-10 ENCOUNTER — Telehealth: Payer: Self-pay | Admitting: Family

## 2016-11-10 ENCOUNTER — Other Ambulatory Visit (INDEPENDENT_AMBULATORY_CARE_PROVIDER_SITE_OTHER): Payer: Self-pay | Admitting: Vascular Surgery

## 2016-11-10 DIAGNOSIS — I739 Peripheral vascular disease, unspecified: Secondary | ICD-10-CM

## 2016-11-10 NOTE — Telephone Encounter (Signed)
Call pt  INR too low and increased risk for blood clots  Since elevated we made temporary changes to coumadin regimen  Is she back to 1mg  per day dosing?  If YES, then advise her to take one HALF tablet as she has done in the past.   Hopefully we will be back on the right track at that point.

## 2016-11-11 ENCOUNTER — Ambulatory Visit (INDEPENDENT_AMBULATORY_CARE_PROVIDER_SITE_OTHER): Payer: Medicare Other | Admitting: Vascular Surgery

## 2016-11-11 ENCOUNTER — Ambulatory Visit (INDEPENDENT_AMBULATORY_CARE_PROVIDER_SITE_OTHER): Payer: Medicare Other

## 2016-11-11 ENCOUNTER — Encounter (INDEPENDENT_AMBULATORY_CARE_PROVIDER_SITE_OTHER): Payer: Self-pay | Admitting: Vascular Surgery

## 2016-11-11 VITALS — BP 136/77 | HR 84 | Resp 15 | Ht 62.0 in | Wt 130.0 lb

## 2016-11-11 DIAGNOSIS — I739 Peripheral vascular disease, unspecified: Secondary | ICD-10-CM

## 2016-11-11 DIAGNOSIS — I10 Essential (primary) hypertension: Secondary | ICD-10-CM

## 2016-11-11 NOTE — Progress Notes (Signed)
Subjective:    Patient ID: Kara [name], female    DOB: 02-16-1923, 83 y.o.   MRN: [id number] Chief Complaint  Patient presents with  . Re-evaluation    1 year ABI   Patient presents for an annual peripheral artery disease follow-up. She presents today without complaint. She denies any claudication, rest pain or ulcerations to her lower extremity. The patient underwent a bilateral lower extremity ABI which was notable for mild right lower extremity arterial disease and left moderate lower extremity arterial disease. When compared to a study conducted a year ago there has been a slight decrease in bilateral ankle-brachial indices. Patient denies any fever, nausea or vomiting.    Review of Systems  Constitutional: Negative.   HENT: Negative.   Eyes: Negative.   Respiratory: Negative.   Cardiovascular: Negative.   Gastrointestinal: Negative.   Endocrine: Negative.   Genitourinary: Negative.   Musculoskeletal: Negative.   Skin: Negative.   Allergic/Immunologic: Negative.   Neurological: Negative.   Hematological: Negative.   Psychiatric/Behavioral: Negative.       Objective:   Physical Exam  Constitutional: She is oriented to person, place, and time. She appears well-developed and well-nourished. No distress.  HENT:  Head: Normocephalic and atraumatic.  Eyes: Conjunctivae are normal. Pupils are equal, round, and reactive to light.  Neck: Normal range of motion.  Cardiovascular: Normal rate, regular rhythm, normal heart sounds and intact distal pulses.   Pulses:      Radial pulses are 2+ on the right side, and 2+ on the left side.       Dorsalis pedis pulses are 2+ on the right side, and 2+ on the left side.       Posterior tibial pulses are 2+ on the right side, and 2+ on the left side.  Pulmonary/Chest: Effort normal.  Musculoskeletal: Normal range of motion. She exhibits no edema.  Neurological: She is alert and oriented to person, place, and time.  Skin: Skin is warm and  dry. She is not diaphoretic.  Psychiatric: She has a normal mood and affect. Her behavior is normal. Judgment and thought content normal.  Vitals reviewed.  BP 136/77 (BP Location: Right Arm)   Pulse 84   Resp 15   Ht 5\' 2"  (1.575 m)   Wt 130 lb (59 kg)   BMI 23.78 kg/m   Past Medical History:  Diagnosis Date  . Allergy   . Atrial fibrillation ([place])   . CAD (coronary artery disease)   . Cancer (HCC)    UTERINE  . Chronic anticoagulation   . Chronic diarrhea   . Decubitus ulcer    sacral region  . Diabetes ([place])    diet controlled  . Dysuria   . Edema of lower extremity    mainly right foot, slightly in left foot.  . Fibrocystic breast disease   . GERD (gastroesophageal reflux disease)   . Glaucoma   . Glaucoma   . Hematuria   . Hemorrhoids   . History of colon polyps   . History of pancreatitis   . Hyperlipidemia   . Hypertension   . Hypokalemia   . IBS (irritable bowel syndrome)   . Microscopic hematuria   . Mitral valve regurgitation   . Osteoarthritis    fingers  . Pernicious anemia   . Plantar fasciitis   . Recurrent UTI   . Skin cancer   . Sleep apnea, obstructive   . Vaginal atrophy   . Vitamin D deficiency   .  Yeast vaginitis    Social History   Social History  . Marital status: Married    Spouse name: N/A  . Number of children: 3  . Years of education: N/A   Occupational History  . Retired Restaurant manager, fast food - primary    Social History Main Topics  . Smoking status: Former Research scientist (life sciences)  . Smokeless tobacco: Never Used     Comment: quit 45 years ago  . Alcohol use 0.0 oz/week     Comment: Rarely, 1-2 times a year  . Drug use: No  . Sexual activity: No   Other Topics Concern  . Not on file   Social History Narrative   Lives at [place]. Born in [place]. Travels frequently.      1 daughter, 2 step children      Regular Exercise -  NO   Daily Caffeine Use:  1 coffee            Past Surgical History:  Procedure  Laterality Date  . ABDOMINAL HYSTERECTOMY  1980's  . ABDOMINAL SURGERY     for villous polyp,,,many years ago  . APPENDECTOMY  1940's  . ASCAD, s/p PTCA  11/28/2005   MID LESION   . BREAST BIOPSY Left 1970's  . CARPAL TUNNEL RELEASE Right 12/13/2015   Procedure: CARPAL TUNNEL RELEASE;  Surgeon: Thornton Park, MD;  Location: [facility] ORS;  Service: Orthopedics;  Laterality: Right;  . COLECTOMY    . PACEMAKER PLACEMENT    . radation     for uterine cance  . REFRACTIVE SURGERY     for bilateral glaumoma  . TONSILLECTOMY  1936   Family History  Problem Relation Age of Onset  . Coronary artery disease Father   . Kidney disease Father   . Cancer Sister   . Diabetes Unknown   . Cancer Mother        COLON  . Bladder Cancer Neg Hx   . Breast cancer Neg Hx    Allergies  Allergen Reactions  . Baycol [Cerivastatin Sodium]   . Cardizem [Diltiazem Hcl]     LOWER EXTREMITY EDEMA  . Crestor [Rosuvastatin Calcium] Other (See Comments)    INTOLERANT  . Dronedarone Other (See Comments)    Fatigue  . Metoprolol Tartrate Other (See Comments)  . Omeprazole Other (See Comments)  . Pravachol     INTOLERANT  . Statins Other (See Comments)  . Vioxx [Rofecoxib]   . Zocor [Simvastatin]     INTOLERANT      Assessment & Plan:  Patient presents for an annual peripheral artery disease follow-up. She presents today without complaint. She denies any claudication, rest pain or ulcerations to her lower extremity. The patient underwent a bilateral lower extremity ABI which was notable for mild right lower extremity arterial disease and left moderate lower extremity arterial disease. When compared to a study conducted a year ago there has been a slight decrease in bilateral ankle-brachial indices. Patient denies any fever, nausea or vomiting.   1. Peripheral artery disease ([place]) - Stable Patient without complaint Physical exam unremarkable Relatively stable arterial studies No indication for  intervention at this time Patient to follow up in 1 year  - VAS [place] ABI WITH/WO TBI; Future  2. Essential hypertension - stable Encouraged good control as its slows the progression of atherosclerotic disease  Current Outpatient Prescriptions on File Prior to Visit  Medication Sig Dispense Refill  . cholecalciferol (VITAMIN D) 1000 UNITS tablet Take 1,000 Units by mouth  daily.    . cyanocobalamin (,VITAMIN B-12,) 1000 MCG/ML injection INJECT 1 ML  INTO THE MUSCLE EVERY 30 DAYS. 1 mL 10  . diltiazem (CARDIZEM SR) 120 MG 12 hr capsule Take 1 capsule (120 mg total) by mouth every morning. 90 capsule 1  . diphenoxylate-atropine (LOMOTIL) 2.5-0.025 MG tablet TAKE 1 TABLET TWO TIMES DAILY AS NEEDED FOR DIARHREA 90 tablet 0  . estradiol (ESTRACE) 0.1 MG/GM vaginal cream Place 1 Applicatorful vaginally at bedtime. (Patient taking differently: Place 1 Applicatorful vaginally daily as needed. ) 42.5 g 0  . HYDROcodone-acetaminophen (NORCO) 5-325 MG tablet Take 1-2 tablets by mouth every 4 (four) hours as needed for moderate pain. MAXIMUM TOTAL ACETAMINOPHEN DOSE IS 4000 MG PER DAY 40 tablet 0  . hydrocortisone-pramoxine (ANALPRAM-HC) 2.5-1 % rectal cream APPLY RECTALLY THREE TIMES DAILY (Patient taking differently: APPLY RECTALLY THREE TIMES DAILY AS NEEDED) 30 g 0  . Hypromellose (GENTEAL) 0.3 % SOLN Place 1 drop into both eyes daily as needed (dryness).     Marland Kitchen lidocaine (LIDODERM) 5 % Place 1 patch onto the skin daily. Remove & Discard patch within 12 hours or as directed by MD 30 patch 0  . methylPREDNISolone (MEDROL DOSEPAK) 4 MG TBPK tablet   0  . metoprolol (LOPRESSOR) 50 MG tablet 25mg  in the morning and 25mg  in the evening 60 tablet 4  . ondansetron (ZOFRAN) 4 MG tablet Take 1 tablet (4 mg total) by mouth every 8 (eight) hours as needed for nausea or vomiting. 30 tablet 0  . triamcinolone cream (KENALOG) 0.1 % Apply 1 application topically 2 (two) times daily. (Patient taking differently: Apply 1  application topically 2 (two) times daily as needed. ) 30 g 0  . warfarin (COUMADIN) 1 MG tablet TAKE ONE TABLET BY MOUTH DAILY AT 6PM 45 tablet 2   No current facility-administered medications on file prior to visit.     There are no Patient Instructions on file for this visit. No Follow-up on file.   Anthony Tamburo A Adiya Selmer, PA-C

## 2016-11-11 NOTE — Telephone Encounter (Signed)
Patient has been informed.

## 2016-11-12 ENCOUNTER — Telehealth: Payer: Self-pay | Admitting: Family

## 2016-11-12 ENCOUNTER — Encounter: Payer: Self-pay | Admitting: Family

## 2016-11-12 LAB — PROTIME-INR

## 2016-11-12 NOTE — Telephone Encounter (Signed)
Per margaret's last note, patient was to take 1/2 tablet of coumadin daily.  Patient has MVR. Please advise for new dosing?

## 2016-11-12 NOTE — Telephone Encounter (Signed)
HER GOALINR IS  2.0 TO 3.0 SO  No changes are needed.  Continue 1/2 tablet daily

## 2016-11-12 NOTE — Telephone Encounter (Signed)
Pt called and stated that she had her pt/inr done today and it was 2.0. Please advise on dosage, thank you!  Call pt @ [phone]

## 2016-11-13 NOTE — Telephone Encounter (Signed)
Reviewed with patient, last night was incorrect, patient was advised to take 1 mg and then add a 1/2 which she didn't do.  She has been taking the 1mg  only.  Due to Providers response (no change indicated) , patient is going to stay on 1mg  and check INR at home on Wednesday and call with results.  Thanks

## 2016-11-13 NOTE — Telephone Encounter (Signed)
Kara [name],  Thanks for jumping in. I reviewed notes and was concerned there was confusion.  Yes, advise patient to stay on regular scheduled dose as prescribed

## 2016-11-13 NOTE — Telephone Encounter (Signed)
Noted and advised patient. thanks

## 2016-11-19 DIAGNOSIS — Z7901 Long term (current) use of anticoagulants: Secondary | ICD-10-CM | POA: Diagnosis not present

## 2016-11-19 LAB — POCT INR: INR: 2.5 — AB (ref 0.9–1.1)

## 2016-11-20 NOTE — Progress Notes (Signed)
Pre visit review using our clinic review tool, if applicable. No additional management support is needed unless otherwise documented below in the visit note. 

## 2016-11-24 DIAGNOSIS — I480 Paroxysmal atrial fibrillation: Secondary | ICD-10-CM | POA: Diagnosis not present

## 2016-11-24 DIAGNOSIS — I2510 Atherosclerotic heart disease of native coronary artery without angina pectoris: Secondary | ICD-10-CM | POA: Diagnosis not present

## 2016-11-24 DIAGNOSIS — I872 Venous insufficiency (chronic) (peripheral): Secondary | ICD-10-CM | POA: Diagnosis not present

## 2016-11-24 DIAGNOSIS — I495 Sick sinus syndrome: Secondary | ICD-10-CM | POA: Diagnosis not present

## 2016-11-25 ENCOUNTER — Telehealth: Payer: Self-pay | Admitting: Family

## 2016-11-25 DIAGNOSIS — K529 Noninfective gastroenteritis and colitis, unspecified: Secondary | ICD-10-CM

## 2016-11-25 MED ORDER — DIPHENOXYLATE-ATROPINE 2.5-0.025 MG PO TABS: ORAL_TABLET | ORAL | 1 refills | Status: DC

## 2016-11-25 NOTE — Telephone Encounter (Signed)
I looked up patient on [place] Controlled Substances Reporting System and saw no activity that raised concern of inappropriate use.   

## 2016-11-26 LAB — POCT INR: INR: 3.1 — AB (ref <=1.1)

## 2016-11-27 ENCOUNTER — Telehealth: Payer: Self-pay | Admitting: Family

## 2016-11-27 NOTE — Telephone Encounter (Signed)
Patient has no idea why its different this time.  Patient was instructed to skip one day and start back normal on her medication.  She stated she will comply.

## 2016-11-27 NOTE — Telephone Encounter (Signed)
Call pt  inr 3.1 slightly above range  Any new medications that pt has started?  No new antibiotics?  Advise her to skip ONE day of coumadin and then take as normal rest of days  Monitor for bleeding  Check again next week

## 2016-12-03 ENCOUNTER — Encounter: Payer: Self-pay | Admitting: Family

## 2016-12-03 LAB — PROTIME-INR: INR: 2.1 — AB (ref 0.9–1.1)

## 2016-12-04 DIAGNOSIS — L820 Inflamed seborrheic keratosis: Secondary | ICD-10-CM | POA: Diagnosis not present

## 2016-12-04 DIAGNOSIS — L538 Other specified erythematous conditions: Secondary | ICD-10-CM | POA: Diagnosis not present

## 2016-12-11 LAB — PROTIME-INR: INR: 2.3 — AB (ref 0.9–1.1)

## 2016-12-15 ENCOUNTER — Telehealth: Payer: Self-pay | Admitting: Family

## 2016-12-15 ENCOUNTER — Encounter: Payer: Self-pay | Admitting: Family

## 2016-12-15 ENCOUNTER — Ambulatory Visit (INDEPENDENT_AMBULATORY_CARE_PROVIDER_SITE_OTHER): Payer: Medicare Other | Admitting: Family

## 2016-12-15 VITALS — BP 122/66 | HR 86 | Temp 98.6°F | Ht 62.0 in | Wt 134.6 lb

## 2016-12-15 DIAGNOSIS — I10 Essential (primary) hypertension: Secondary | ICD-10-CM

## 2016-12-15 DIAGNOSIS — Z1231 Encounter for screening mammogram for malignant neoplasm of breast: Secondary | ICD-10-CM

## 2016-12-15 DIAGNOSIS — R350 Frequency of micturition: Secondary | ICD-10-CM

## 2016-12-15 DIAGNOSIS — Z1239 Encounter for other screening for malignant neoplasm of breast: Secondary | ICD-10-CM

## 2016-12-15 DIAGNOSIS — R300 Dysuria: Secondary | ICD-10-CM

## 2016-12-15 LAB — URINALYSIS, MICROSCOPIC ONLY

## 2016-12-15 LAB — BASIC METABOLIC PANEL
BUN: 16 mg/dL (ref 6–23)
CO2: 30 mEq/L (ref 19–32)
Calcium: 9 mg/dL (ref 8.4–10.5)
Chloride: 102 mEq/L (ref 96–112)
Creatinine, Ser: 0.78 mg/dL (ref 0.40–1.20)
GFR: 73.38 mL/min (ref >60.00)
Glucose, Bld: 155 mg/dL — ABNORMAL HIGH (ref 70–99)
Potassium: 3.2 mEq/L — ABNORMAL LOW (ref 3.5–5.1)
Sodium: 142 mEq/L (ref 135–145)

## 2016-12-15 LAB — POCT URINALYSIS DIPSTICK
Bilirubin, UA: NEGATIVE
Glucose, UA: NEGATIVE
Ketones, UA: NEGATIVE
Nitrite, UA: NEGATIVE
Protein, UA: 30
Spec Grav, UA: 1.02 (ref 1.010–1.025)
Urobilinogen, UA: 1 E.U./dL
pH, UA: 6.5 (ref 5.0–8.0)

## 2016-12-15 NOTE — Assessment & Plan Note (Signed)
At goal. Will continue current regimen. Pending BMP

## 2016-12-15 NOTE — Progress Notes (Signed)
Pre visit review using our clinic review tool, if applicable. No additional management support is needed unless otherwise documented below in the visit note. 

## 2016-12-15 NOTE — Telephone Encounter (Signed)
Call pt  inr in goal

## 2016-12-15 NOTE — Assessment & Plan Note (Signed)
Afebrile. Patient is well-appearing. Pending urine culture

## 2016-12-15 NOTE — Progress Notes (Signed)
Subjective:    Patient ID: Kara [name], female    DOB: 02-17-1923, 83 y.o.   MRN: [id number]  CC: Kara [name] is a 83 y.o. female who presents today for follow up.   HPI: Concern for urinary tract infection  Urinary frequency x one week  No fever, flank pain, N, V, chest pain'    11/2016 - cardiology for venous insufficiency, pacemaker showed normal battery status Had decreased metoprolol however went back to regular dose due to palpitations.   Note- no longer does colonoscopy. Due for mammogram.   HISTORY:  Past Medical History:  Diagnosis Date  . Allergy   . Atrial fibrillation ([place])   . CAD (coronary artery disease)   . Cancer (HCC)    UTERINE  . Chronic anticoagulation   . Chronic diarrhea   . Decubitus ulcer    sacral region  . Diabetes ([place])    diet controlled  . Dysuria   . Edema of lower extremity    mainly right foot, slightly in left foot.  . Fibrocystic breast disease   . GERD (gastroesophageal reflux disease)   . Glaucoma   . Glaucoma   . Hematuria   . Hemorrhoids   . History of colon polyps   . History of pancreatitis   . Hyperlipidemia   . Hypertension   . Hypokalemia   . IBS (irritable bowel syndrome)   . Microscopic hematuria   . Mitral valve regurgitation   . Osteoarthritis    fingers  . Pernicious anemia   . Plantar fasciitis   . Recurrent UTI   . Skin cancer   . Sleep apnea, obstructive   . Vaginal atrophy   . Vitamin D deficiency   . Yeast vaginitis    Past Surgical History:  Procedure Laterality Date  . ABDOMINAL HYSTERECTOMY  1980's  . ABDOMINAL SURGERY     for villous polyp,,,many years ago  . APPENDECTOMY  1940's  . ASCAD, s/p PTCA  11/28/2005   MID LESION   . BREAST BIOPSY Left 1970's  . CARPAL TUNNEL RELEASE Right 12/13/2015   Procedure: CARPAL TUNNEL RELEASE;  Surgeon: Thornton Park, MD;  Location: [facility] ORS;  Service: Orthopedics;  Laterality: Right;  . COLECTOMY    . PACEMAKER PLACEMENT    . radation     for  uterine cance  . REFRACTIVE SURGERY     for bilateral glaumoma  . TONSILLECTOMY  1936   Family History  Problem Relation Age of Onset  . Coronary artery disease Father   . Kidney disease Father   . Cancer Sister   . Diabetes Unknown   . Cancer Mother        COLON  . Bladder Cancer Neg Hx   . Breast cancer Neg Hx     Allergies: Baycol [cerivastatin sodium]; Cardizem [diltiazem hcl]; Crestor [rosuvastatin calcium]; Dronedarone; Metoprolol tartrate; Omeprazole; Pravachol; Statins; Vioxx [rofecoxib]; and Zocor [simvastatin] Current Outpatient Prescriptions on File Prior to Visit  Medication Sig Dispense Refill  . CARTIA XT 120 MG 24 hr capsule   0  . cholecalciferol (VITAMIN D) 1000 UNITS tablet Take 1,000 Units by mouth daily.    . cyanocobalamin (,VITAMIN B-12,) 1000 MCG/ML injection INJECT 1 ML  INTO THE MUSCLE EVERY 30 DAYS. 1 mL 10  . diltiazem (CARDIZEM SR) 120 MG 12 hr capsule Take 1 capsule (120 mg total) by mouth every morning. 90 capsule 1  . diphenoxylate-atropine (LOMOTIL) 2.5-0.025 MG tablet TAKE 1 TABLET TWO TIMES DAILY AS  NEEDED FOR DIARHREA 60 tablet 1  . estradiol (ESTRACE) 0.1 MG/GM vaginal cream Place 1 Applicatorful vaginally at bedtime. (Patient taking differently: Place 1 Applicatorful vaginally daily as needed. ) 42.5 g 0  . HYDROcodone-acetaminophen (NORCO) 5-325 MG tablet Take 1-2 tablets by mouth every 4 (four) hours as needed for moderate pain. MAXIMUM TOTAL ACETAMINOPHEN DOSE IS 4000 MG PER DAY 40 tablet 0  . hydrocortisone-pramoxine (ANALPRAM-HC) 2.5-1 % rectal cream APPLY RECTALLY THREE TIMES DAILY (Patient taking differently: APPLY RECTALLY THREE TIMES DAILY AS NEEDED) 30 g 0  . Hypromellose (GENTEAL) 0.3 % SOLN Place 1 drop into both eyes daily as needed (dryness).     Marland Kitchen ketoconazole (NIZORAL) 2 % cream   1  . lidocaine (LIDODERM) 5 % Place 1 patch onto the skin daily. Remove & Discard patch within 12 hours or as directed by MD 30 patch 0  .  methylPREDNISolone (MEDROL DOSEPAK) 4 MG TBPK tablet   0  . metoprolol (LOPRESSOR) 50 MG tablet 25mg  in the morning and 25mg  in the evening 60 tablet 4  . ondansetron (ZOFRAN) 4 MG tablet Take 1 tablet (4 mg total) by mouth every 8 (eight) hours as needed for nausea or vomiting. 30 tablet 0  . triamcinolone cream (KENALOG) 0.1 % Apply 1 application topically 2 (two) times daily. (Patient taking differently: Apply 1 application topically 2 (two) times daily as needed. ) 30 g 0  . warfarin (COUMADIN) 1 MG tablet TAKE ONE TABLET BY MOUTH DAILY AT 6PM 45 tablet 2   No current facility-administered medications on file prior to visit.     Social History  Substance Use Topics  . Smoking status: Former Research scientist (life sciences)  . Smokeless tobacco: Never Used     Comment: quit 45 years ago  . Alcohol use 0.0 oz/week     Comment: Rarely, 1-2 times a year    Review of Systems  Constitutional: Negative for chills and fever.  Respiratory: Negative for cough.   Cardiovascular: Negative for chest pain and palpitations.  Gastrointestinal: Negative for nausea and vomiting.  Genitourinary: Positive for dysuria and frequency.      Objective:    BP 122/66   Pulse 86   Temp 98.6 F (37 C) (Oral)   Ht 5\' 2"  (1.575 m)   Wt 134 lb 9.6 oz (61.1 kg)   SpO2 94%   BMI 24.62 kg/m  BP Readings from Last 3 Encounters:  12/15/16 122/66  11/11/16 136/77  10/01/16 134/68   Wt Readings from Last 3 Encounters:  12/15/16 134 lb 9.6 oz (61.1 kg)  11/11/16 130 lb (59 kg)  10/14/16 129 lb 12.8 oz (58.9 kg)    Physical Exam  Constitutional: She appears well-developed and well-nourished.  Cardiovascular: Normal rate, regular rhythm, normal heart sounds and normal pulses.   Pulmonary/Chest: Effort normal and breath sounds normal. She has no wheezes. She has no rhonchi. She has no rales.  Abdominal: There is no CVA tenderness.  Neurological: She is alert.  Skin: Skin is warm and dry.  Psychiatric: She has a normal mood  and affect. Her speech is normal and behavior is normal. Thought content normal.  Vitals reviewed.      Assessment & Plan:   Problem List Items Addressed This Visit      Cardiovascular and Mediastinum   Essential hypertension    At goal. Will continue current regimen. Pending BMP      Relevant Orders   Basic metabolic panel     Other  Urinary frequency    Afebrile. Patient is well-appearing. Pending urine culture       Other Visit Diagnoses    Dysuria    -  Primary   Relevant Orders   POCT urinalysis dipstick (Completed)   CULTURE, URINE COMPREHENSIVE   Urine Microscopic Only       I am having Ms. Reamer maintain her cholecalciferol, Hypromellose, lidocaine, cyanocobalamin, hydrocortisone-pramoxine, triamcinolone cream, estradiol, HYDROcodone-acetaminophen, ondansetron, methylPREDNISolone, metoprolol tartrate, diltiazem, warfarin, CARTIA XT, ketoconazole, and diphenoxylate-atropine.   No orders of the defined types were placed in this encounter.   Return precautions given.   Risks, benefits, and alternatives of the medications and treatment plan prescribed today were discussed, and patient expressed understanding.   Education regarding symptom management and diagnosis given to patient on AVS.  Continue to follow with Burnard Hawthorne, FNP for routine health maintenance.   [name] and I agreed with plan.   Mable Paris, FNP

## 2016-12-15 NOTE — Patient Instructions (Addendum)
Labs today  [place] of water  Will culture you with urine culture  Pleasure seeing you always!

## 2016-12-16 ENCOUNTER — Other Ambulatory Visit: Payer: Self-pay | Admitting: Family

## 2016-12-16 DIAGNOSIS — E876 Hypokalemia: Secondary | ICD-10-CM

## 2016-12-16 DIAGNOSIS — R319 Hematuria, unspecified: Secondary | ICD-10-CM

## 2016-12-18 ENCOUNTER — Other Ambulatory Visit: Payer: Self-pay | Admitting: Family

## 2016-12-18 ENCOUNTER — Encounter: Payer: Self-pay | Admitting: Family

## 2016-12-18 DIAGNOSIS — N390 Urinary tract infection, site not specified: Secondary | ICD-10-CM

## 2016-12-18 DIAGNOSIS — Z7901 Long term (current) use of anticoagulants: Secondary | ICD-10-CM | POA: Diagnosis not present

## 2016-12-18 LAB — CULTURE, URINE COMPREHENSIVE

## 2016-12-18 LAB — PROTIME-INR: INR: 1.9 — AB (ref 0.9–1.1)

## 2016-12-18 MED ORDER — AMOXICILLIN-POT CLAVULANATE 875-125 MG PO TABS: 1.0000 | ORAL_TABLET | Freq: Two times a day (BID) | ORAL | 0 refills | Status: AC

## 2016-12-19 NOTE — Progress Notes (Signed)
Left message for patient to return call back.  

## 2016-12-19 NOTE — Progress Notes (Signed)
Patient has been informed.

## 2016-12-22 ENCOUNTER — Telehealth: Payer: Self-pay | Admitting: *Deleted

## 2016-12-22 ENCOUNTER — Telehealth: Payer: Self-pay | Admitting: Family

## 2016-12-22 NOTE — Telephone Encounter (Signed)
Patient stated she didn't miss a dose last week. She missed one yesterday. Please advise.

## 2016-12-22 NOTE — Telephone Encounter (Signed)
Call pt  INR is low - I suspect that holding a dose last week while on antibiotic has caused  Would you advise her to take a half tablet today in addition to her regular 1mg  ?   This is for today only and then she should be back on regular 1mg  dose  Ensure also that she has SOB, CP, palpitations.

## 2016-12-22 NOTE — Telephone Encounter (Signed)
Please advise 

## 2016-12-22 NOTE — Telephone Encounter (Signed)
Patient requested a call in regards to her antibiotic and having a yeast medication called into the pharmacy  Pt contact [phone]

## 2016-12-23 ENCOUNTER — Telehealth: Payer: Self-pay | Admitting: Family

## 2016-12-23 ENCOUNTER — Ambulatory Visit: Payer: Medicare Other | Admitting: Family

## 2016-12-23 NOTE — Telephone Encounter (Signed)
Kara [name] please call this patient

## 2016-12-23 NOTE — Telephone Encounter (Signed)
Pt states she has a UTI and is passing some blood color pinkish urine. Pt went to the bathroom and stating it is now a rust color. With pain and not feeling well. Pt is on antibiotic. Pt was transferred to [facility]. Thank you!

## 2016-12-23 NOTE — Telephone Encounter (Signed)
Call pt  Advise her to start a monistat vaginal 7 day. I would prefer her to use a topical verus oral as will interfere less with coumadin.   If she missed a dose yesterday, then I would advise taking additional HALF tablet today then resume normal.

## 2016-12-23 NOTE — Telephone Encounter (Signed)
Patient stated that she would like to come in and be re evaluated. Scheduled for tomorrow at 11:30.

## 2016-12-23 NOTE — Telephone Encounter (Signed)
FYI

## 2016-12-23 NOTE — Telephone Encounter (Signed)
Left message for patient to return call back.  

## 2016-12-23 NOTE — Telephone Encounter (Signed)
Pt called back returning your call. Pt would like for you to call her early in the am. Please and Thank you!  Call pt @ [phone].

## 2016-12-24 NOTE — Telephone Encounter (Signed)
Spoken to patient.  Patientt will drop off urine Friday and will take the .5 extra of cumadin and the monostat.

## 2016-12-24 NOTE — Telephone Encounter (Signed)
fyi

## 2016-12-25 ENCOUNTER — Encounter: Payer: Self-pay | Admitting: Family

## 2016-12-25 LAB — PROTIME-INR

## 2016-12-26 ENCOUNTER — Other Ambulatory Visit (INDEPENDENT_AMBULATORY_CARE_PROVIDER_SITE_OTHER): Payer: Medicare Other

## 2016-12-26 ENCOUNTER — Telehealth: Payer: Self-pay | Admitting: Family

## 2016-12-26 DIAGNOSIS — R319 Hematuria, unspecified: Secondary | ICD-10-CM

## 2016-12-26 DIAGNOSIS — R300 Dysuria: Secondary | ICD-10-CM | POA: Diagnosis not present

## 2016-12-26 DIAGNOSIS — A499 Bacterial infection, unspecified: Secondary | ICD-10-CM | POA: Diagnosis not present

## 2016-12-26 DIAGNOSIS — N390 Urinary tract infection, site not specified: Secondary | ICD-10-CM | POA: Diagnosis not present

## 2016-12-26 LAB — URINALYSIS, ROUTINE W REFLEX MICROSCOPIC
Bilirubin Urine: NEGATIVE
Ketones, ur: NEGATIVE
Nitrite: NEGATIVE
Specific Gravity, Urine: 1.01 (ref 1.000–1.030)
Total Protein, Urine: NEGATIVE
Urine Glucose: NEGATIVE
Urobilinogen, UA: 0.2 (ref 0.0–1.0)
pH: 6 (ref 5.0–8.0)

## 2016-12-26 NOTE — Telephone Encounter (Signed)
Left message for patient to return call back.  

## 2016-12-26 NOTE — Telephone Encounter (Signed)
Please advise 

## 2016-12-26 NOTE — Telephone Encounter (Signed)
What does the medication all do to her Blood?

## 2016-12-26 NOTE — Telephone Encounter (Signed)
Coumadin Thins it  ( brock, happen to be online today however in future, these are questions you can ask Lucien Mons)

## 2016-12-26 NOTE — Telephone Encounter (Signed)
Pt called stating that she is still having issues. She finished antiobiotics and she is still having low back pain, and she gets waves of nausea. Reminded pt that she was to come in today to leave a urine, she forgot. She is going to try and get transportation. Please advise, thank you!  Call pt @ [phone]

## 2016-12-26 NOTE — Telephone Encounter (Signed)
Please call and triage pt-  I would advise she is seen at urgent care so symptoms do not worsen over weekend

## 2016-12-26 NOTE — Telephone Encounter (Signed)
Patient stated she will go to urgent care.  She would also like to know what the number of her INR mean and which way does the blood become thick or thin while on coumadin. Please advise

## 2016-12-26 NOTE — Telephone Encounter (Signed)
Thank you HIgher the INR- the THINNER the blood

## 2016-12-27 ENCOUNTER — Telehealth: Payer: Self-pay | Admitting: Family

## 2016-12-27 NOTE — Telephone Encounter (Signed)
Called patient to check on her after urine result. Explained there was a lot of blood, leukocytes. She informed me and that she been in urgent care earlier that day and then started on Levaquin. She finished the course of Augmentin this week and actually was feeling some better but still not feeling completely well. No fever, nausea, vomiting, hematuria. Advised INR may raise on levaquin; she will recheck INR on Monday Advised f/u next week and we could recheck urine

## 2016-12-27 NOTE — Telephone Encounter (Signed)
Close error

## 2016-12-29 ENCOUNTER — Telehealth: Payer: Self-pay | Admitting: Family

## 2016-12-29 NOTE — Telephone Encounter (Signed)
Call pt   Her inr last week was 1.4  I spoke with her on Friday however was unaware how low it was  She is on an anitbiotic so likely INR has increased  Please have her check TODAY

## 2016-12-30 ENCOUNTER — Telehealth: Payer: Self-pay | Admitting: *Deleted

## 2016-12-30 LAB — PROTIME-INR: Protime: 1.6 — AB (ref 10.0–13.8)

## 2016-12-30 NOTE — Telephone Encounter (Signed)
We will be having a fax sent to [place]. Pleas advise.

## 2016-12-30 NOTE — Telephone Encounter (Signed)
Patient has been informed, she stated she will have it done today.

## 2016-12-30 NOTE — Telephone Encounter (Signed)
FYI Patient currently has a out of range INR , this call was made by drawing lab however they hung up

## 2016-12-31 ENCOUNTER — Encounter: Payer: Self-pay | Admitting: Family

## 2016-12-31 ENCOUNTER — Ambulatory Visit (INDEPENDENT_AMBULATORY_CARE_PROVIDER_SITE_OTHER): Payer: Medicare Other | Admitting: Family

## 2016-12-31 ENCOUNTER — Other Ambulatory Visit: Payer: Medicare Other

## 2016-12-31 ENCOUNTER — Telehealth: Payer: Self-pay | Admitting: Family

## 2016-12-31 VITALS — BP 128/58 | HR 84 | Temp 98.3°F | Ht 62.0 in | Wt 132.4 lb

## 2016-12-31 DIAGNOSIS — T3695XA Adverse effect of unspecified systemic antibiotic, initial encounter: Secondary | ICD-10-CM

## 2016-12-31 DIAGNOSIS — B379 Candidiasis, unspecified: Secondary | ICD-10-CM | POA: Diagnosis not present

## 2016-12-31 DIAGNOSIS — R300 Dysuria: Secondary | ICD-10-CM

## 2016-12-31 DIAGNOSIS — R197 Diarrhea, unspecified: Secondary | ICD-10-CM | POA: Diagnosis not present

## 2016-12-31 LAB — POCT URINALYSIS DIPSTICK
Bilirubin, UA: NEGATIVE
Glucose, UA: NEGATIVE
Ketones, UA: NEGATIVE
Nitrite, UA: NEGATIVE
Protein, UA: NEGATIVE
Spec Grav, UA: <=1.005 — AB (ref 1.010–1.025)
Urobilinogen, UA: 0.2 E.U./dL
pH, UA: 6.5 (ref 5.0–8.0)

## 2016-12-31 LAB — URINALYSIS, MICROSCOPIC ONLY: RBC / HPF: NONE SEEN (ref 0–?)

## 2016-12-31 MED ORDER — FLUCONAZOLE 150 MG PO TABS: 150.0000 mg | ORAL_TABLET | Freq: Once | ORAL | 1 refills | Status: AC

## 2016-12-31 NOTE — Patient Instructions (Addendum)
As discussed-  INR Goal is between 2-3   If less than 2, its LOW and your blood is technically THICK  If greater than 3, it's to thin.   Continue probiotics  Lets check urine and stool.   Let me know if symptoms recur.

## 2016-12-31 NOTE — Telephone Encounter (Signed)
Patient comes in today for appointment will inform her then.

## 2016-12-31 NOTE — Telephone Encounter (Signed)
Call pt  INR low - had been 2.3 last week.   She needs to take half tablet EXTRA today to try to bring INR back up

## 2016-12-31 NOTE — Assessment & Plan Note (Signed)
Chronic. 

## 2016-12-31 NOTE — Progress Notes (Signed)
Subjective:    Patient ID: Kara [name], female    DOB: September 04, 1922, 83 y.o.   MRN: [id number]  CC: Kara [name] is a 83 y.o. female who presents today for follow up.   HPI: Follow up UTI.   Feeling better today.   Completed levaquin; and also used OTC monistat.  No fever, dysuria, abdominal pain.  Still complains of some vaginal itching. Has taken one dose of difflucan, with improvement.  Would like refill of diflucan.   Took half tab extra of coumadin yesterday when INR was low.    Chronic diarrhea, not bloody, but worse since two antibiotics.       HISTORY:  Past Medical History:  Diagnosis Date  . Allergy   . Atrial fibrillation ([place])   . CAD (coronary artery disease)   . Cancer (HCC)    UTERINE  . Chronic anticoagulation   . Chronic diarrhea   . Decubitus ulcer    sacral region  . Diabetes ([place])    diet controlled  . Dysuria   . Edema of lower extremity    mainly right foot, slightly in left foot.  . Fibrocystic breast disease   . GERD (gastroesophageal reflux disease)   . Glaucoma   . Glaucoma   . Hematuria   . Hemorrhoids   . History of colon polyps   . History of pancreatitis   . Hyperlipidemia   . Hypertension   . Hypokalemia   . IBS (irritable bowel syndrome)   . Microscopic hematuria   . Mitral valve regurgitation   . Osteoarthritis    fingers  . Pernicious anemia   . Plantar fasciitis   . Recurrent UTI   . Skin cancer   . Sleep apnea, obstructive   . Vaginal atrophy   . Vitamin D deficiency   . Yeast vaginitis    Past Surgical History:  Procedure Laterality Date  . ABDOMINAL HYSTERECTOMY  1980's  . ABDOMINAL SURGERY     for villous polyp,,,many years ago  . APPENDECTOMY  1940's  . ASCAD, s/p PTCA  11/28/2005   MID LESION   . BREAST BIOPSY Left 1970's  . CARPAL TUNNEL RELEASE Right 12/13/2015   Procedure: CARPAL TUNNEL RELEASE;  Surgeon: Thornton Park, MD;  Location: [facility] ORS;  Service: Orthopedics;  Laterality: Right;  .  COLECTOMY    . PACEMAKER PLACEMENT    . radation     for uterine cance  . REFRACTIVE SURGERY     for bilateral glaumoma  . TONSILLECTOMY  1936   Family History  Problem Relation Age of Onset  . Coronary artery disease Father   . Kidney disease Father   . Cancer Sister   . Diabetes Unknown   . Cancer Mother        COLON  . Bladder Cancer Neg Hx   . Breast cancer Neg Hx     Allergies: Baycol [cerivastatin sodium]; Cardizem [diltiazem hcl]; Crestor [rosuvastatin calcium]; Dronedarone; Metoprolol tartrate; Omeprazole; Pravachol; Statins; Vioxx [rofecoxib]; and Zocor [simvastatin] Current Outpatient Prescriptions on File Prior to Visit  Medication Sig Dispense Refill  . CARTIA XT 120 MG 24 hr capsule   0  . cholecalciferol (VITAMIN D) 1000 UNITS tablet Take 1,000 Units by mouth daily.    . cyanocobalamin (,VITAMIN B-12,) 1000 MCG/ML injection INJECT 1 ML  INTO THE MUSCLE EVERY 30 DAYS. 1 mL 10  . diltiazem (CARDIZEM SR) 120 MG 12 hr capsule Take 1 capsule (120 mg total) by mouth every morning.  90 capsule 1  . diphenoxylate-atropine (LOMOTIL) 2.5-0.025 MG tablet TAKE 1 TABLET TWO TIMES DAILY AS NEEDED FOR DIARHREA 60 tablet 1  . estradiol (ESTRACE) 0.1 MG/GM vaginal cream Place 1 Applicatorful vaginally at bedtime. (Patient taking differently: Place 1 Applicatorful vaginally daily as needed. ) 42.5 g 0  . HYDROcodone-acetaminophen (NORCO) 5-325 MG tablet Take 1-2 tablets by mouth every 4 (four) hours as needed for moderate pain. MAXIMUM TOTAL ACETAMINOPHEN DOSE IS 4000 MG PER DAY 40 tablet 0  . hydrocortisone-pramoxine (ANALPRAM-HC) 2.5-1 % rectal cream APPLY RECTALLY THREE TIMES DAILY (Patient taking differently: APPLY RECTALLY THREE TIMES DAILY AS NEEDED) 30 g 0  . Hypromellose (GENTEAL) 0.3 % SOLN Place 1 drop into both eyes daily as needed (dryness).     Marland Kitchen ketoconazole (NIZORAL) 2 % cream   1  . lidocaine (LIDODERM) 5 % Place 1 patch onto the skin daily. Remove & Discard patch within  12 hours or as directed by MD 30 patch 0  . methylPREDNISolone (MEDROL DOSEPAK) 4 MG TBPK tablet   0  . metoprolol (LOPRESSOR) 50 MG tablet 25mg  in the morning and 25mg  in the evening 60 tablet 4  . ondansetron (ZOFRAN) 4 MG tablet Take 1 tablet (4 mg total) by mouth every 8 (eight) hours as needed for nausea or vomiting. 30 tablet 0  . triamcinolone cream (KENALOG) 0.1 % Apply 1 application topically 2 (two) times daily. (Patient taking differently: Apply 1 application topically 2 (two) times daily as needed. ) 30 g 0  . warfarin (COUMADIN) 1 MG tablet TAKE ONE TABLET BY MOUTH DAILY AT 6PM 45 tablet 2   No current facility-administered medications on file prior to visit.     Social History  Substance Use Topics  . Smoking status: Former Research scientist (life sciences)  . Smokeless tobacco: Never Used     Comment: quit 45 years ago  . Alcohol use 0.0 oz/week     Comment: Rarely, 1-2 times a year    Review of Systems  Constitutional: Negative for chills and fever.  Respiratory: Negative for cough.   Cardiovascular: Negative for chest pain and palpitations.  Gastrointestinal: Positive for diarrhea (chronic). Negative for abdominal pain, nausea and vomiting.  Genitourinary: Negative for dysuria and frequency.      Objective:    BP (!) 128/58   Pulse 84   Temp 98.3 F (36.8 C) (Oral)   Ht 5\' 2"  (1.575 m)   Wt 132 lb 6.4 oz (60.1 kg)   SpO2 94%   BMI 24.22 kg/m  BP Readings from Last 3 Encounters:  12/31/16 (!) 128/58  12/15/16 122/66  11/11/16 136/77   Wt Readings from Last 3 Encounters:  12/31/16 132 lb 6.4 oz (60.1 kg)  12/15/16 134 lb 9.6 oz (61.1 kg)  11/11/16 130 lb (59 kg)    Physical Exam  Constitutional: She appears well-developed and well-nourished.  Cardiovascular: Normal rate, regular rhythm, normal heart sounds and normal pulses.   Pulmonary/Chest: Effort normal and breath sounds normal. She has no wheezes. She has no rhonchi. She has no rales.  Abdominal: There is no CVA  tenderness.  Neurological: She is alert.  Skin: Skin is warm and dry.  Psychiatric: She has a normal mood and affect. Her speech is normal and behavior is normal. Thought content normal.  Vitals reviewed.      Assessment & Plan:   1. Diarrhea, unspecified type Acute on chronic. Worsened by recent antibiotics. No abdominal pain, bloody stool. Patient is afebrile today. Pending stool cultures  to ensure the bacterial coinfection since 2 rounds of antibiotics.  - Gastrointestinal Pathogen Panel PCR; Future  2. Antibiotic-induced yeast infection Refilled as requested. Advised patient may increase her INR and check INR if she takes diflucan.  - fluconazole (DIFLUCAN) 150 MG tablet; Take 1 tablet (150 mg total) by mouth once. Take one tablet PO once. If continue to have symptoms, may take one tablet PO 3 days later.  Dispense: 2 tablet; Refill: 1  3. Dysuria Afebrile today. Reassured as patient is feeling better now. Small amount leukocytes seen on dipstick. No blood seen on micro. Pending UC.   - POCT urinalysis dipstick - CULTURE, URINE COMPREHENSIVE - Urine Microscopic Only   I am having Ms. Gang maintain her cholecalciferol, Hypromellose, lidocaine, cyanocobalamin, hydrocortisone-pramoxine, triamcinolone cream, estradiol, HYDROcodone-acetaminophen, ondansetron, methylPREDNISolone, metoprolol tartrate, diltiazem, warfarin, CARTIA XT, ketoconazole, and diphenoxylate-atropine.   No orders of the defined types were placed in this encounter.   Return precautions given.   Risks, benefits, and alternatives of the medications and treatment plan prescribed today were discussed, and patient expressed understanding.   Education regarding symptom management and diagnosis given to patient on AVS.  Continue to follow with Burnard Hawthorne, FNP for routine health maintenance.   [name] and I agreed with plan.   Mable Paris, FNP

## 2016-12-31 NOTE — Progress Notes (Signed)
Pre visit review using our clinic review tool, if applicable. No additional management support is needed unless otherwise documented below in the visit note. 

## 2017-01-02 LAB — CULTURE, URINE COMPREHENSIVE

## 2017-01-05 ENCOUNTER — Encounter: Payer: Self-pay | Admitting: Family

## 2017-01-05 ENCOUNTER — Other Ambulatory Visit: Payer: Medicare Other

## 2017-01-05 DIAGNOSIS — R197 Diarrhea, unspecified: Secondary | ICD-10-CM

## 2017-01-06 ENCOUNTER — Telehealth: Payer: Self-pay | Admitting: Family

## 2017-01-06 DIAGNOSIS — X32XXXA Exposure to sunlight, initial encounter: Secondary | ICD-10-CM | POA: Diagnosis not present

## 2017-01-06 DIAGNOSIS — L538 Other specified erythematous conditions: Secondary | ICD-10-CM | POA: Diagnosis not present

## 2017-01-06 DIAGNOSIS — B078 Other viral warts: Secondary | ICD-10-CM | POA: Diagnosis not present

## 2017-01-06 DIAGNOSIS — L570 Actinic keratosis: Secondary | ICD-10-CM | POA: Diagnosis not present

## 2017-01-06 LAB — POCT INR: INR: 3.1 — AB (ref <=1.1)

## 2017-01-06 NOTE — Telephone Encounter (Signed)
noted 

## 2017-01-06 NOTE — Telephone Encounter (Signed)
Patient states she has some nausea without vomiting and back pain with some itching . No complaints of any other symptoms. States she feels better since finishing the second round of antibiotics. Spoke with NP, and she says that pt is on a second dose of diflucan. If itching symptoms persist after second dose of diflucan pt should call us back. Back pain may be musculoskeletal if those symptoms persist. Will call patient back and make her aware.

## 2017-01-06 NOTE — Telephone Encounter (Signed)
Spoke with patient again to clarify symptoms. She states that diflucan is working, she thinks it will be fine after the second pill. She also said that the back pain was mild across her lower back when all of the symptoms started with the UTI. She noticed it had worsened on Saturday and stated that her pain was an 8-10 on Monday and more to the left of her back. Patient took tylenol and stated it was effective. Patient stated on the phone that she would say her pain was about a 3 or a 4 but feels better today than it has in the last 3 days. Instructed patient that we would be in touch to follow up and give stool culture results and she to let us know if symptoms worsened before then.

## 2017-01-06 NOTE — Telephone Encounter (Signed)
Pt called and stated that she finished her second round of antibiotics last Wednesday for a UTI. Pt states that is till having issues, with back pain, and some itching and is starting a second round of yeast medication. Please advise, thank you!  Call pt @ [phone]

## 2017-01-08 ENCOUNTER — Telehealth: Payer: Self-pay | Admitting: Family

## 2017-01-08 DIAGNOSIS — B379 Candidiasis, unspecified: Secondary | ICD-10-CM

## 2017-01-08 DIAGNOSIS — T3695XA Adverse effect of unspecified systemic antibiotic, initial encounter: Principal | ICD-10-CM

## 2017-01-08 NOTE — Telephone Encounter (Signed)
Call pt  INR 3.1 which is slightly elevated from goal.   No changes at this time.   Recheck next week

## 2017-01-08 NOTE — Telephone Encounter (Signed)
Left message for patient to return call back.  

## 2017-01-09 MED ORDER — FLUCONAZOLE 150 MG PO TABS: 150.0000 mg | ORAL_TABLET | Freq: Once | ORAL | 1 refills | Status: AC

## 2017-01-09 NOTE — Telephone Encounter (Signed)
Pt requested a call  Pt contact [phone]

## 2017-01-09 NOTE — Telephone Encounter (Signed)
Sent in Please have pt follow up with me- wandering something else like BV.  She should be adequately treated by now and may need pelvic exam

## 2017-01-09 NOTE — Telephone Encounter (Signed)
Left message for patient to return call back.  

## 2017-01-09 NOTE — Telephone Encounter (Signed)
Patient has been informed, she also wanteed to know if margaret will send in more yeast medication due to her SX still being there.

## 2017-01-09 NOTE — Telephone Encounter (Signed)
Pt called back returning your call. Thank you! °

## 2017-01-09 NOTE — Telephone Encounter (Signed)
Patient has been informed.

## 2017-01-12 LAB — GASTROINTESTINAL PATHOGEN PANEL PCR
C. difficile Tox A/B, PCR: NOT DETECTED
Campylobacter, PCR: NOT DETECTED
Cryptosporidium, PCR: NOT DETECTED
E coli (ETEC) LT/ST PCR: NOT DETECTED
E coli (STEC) stx1/stx2, PCR: NOT DETECTED
E coli 0157, PCR: NOT DETECTED
Giardia lamblia, PCR: NOT DETECTED
Norovirus, PCR: NOT DETECTED
Rotavirus A, PCR: NOT DETECTED
Salmonella, PCR: NOT DETECTED
Shigella, PCR: NOT DETECTED

## 2017-01-13 DIAGNOSIS — Z7901 Long term (current) use of anticoagulants: Secondary | ICD-10-CM | POA: Diagnosis not present

## 2017-01-15 ENCOUNTER — Telehealth: Payer: Self-pay | Admitting: Family

## 2017-01-15 NOTE — Telephone Encounter (Signed)
Called patient but was unable to leave VM due to phone kept ringing.

## 2017-01-15 NOTE — Telephone Encounter (Signed)
Call pt  INR out of range- perhaps antifungal medications she has taken??   She needs to skip dose today and take half tablet tomorrow only.  Also, is she open to discussion about another agent for anticoagulation?  DId she ever try another under Dr Nehemiah Massed ( cardiology)?   Medication such as elliquis doesn't require INR checking.

## 2017-01-16 NOTE — Telephone Encounter (Signed)
Pt requested a call at [phone]

## 2017-01-16 NOTE — Telephone Encounter (Signed)
Informed patient.  She is very upset and she doesn't want to change anything until she talks to her cardiologist.

## 2017-01-20 NOTE — Telephone Encounter (Signed)
I did not mean to upset her- merely a recommendation.  If she would like to make an appt to discuss,  Please call her and advise her to do so.

## 2017-01-20 NOTE — Telephone Encounter (Signed)
Patient will wait to discuss it with cardiology.

## 2017-01-21 ENCOUNTER — Telehealth: Payer: Self-pay | Admitting: Family

## 2017-01-21 ENCOUNTER — Encounter: Payer: Self-pay | Admitting: Family

## 2017-01-21 LAB — PROTIME-INR: INR: 2.3 — AB (ref 0.9–1.1)

## 2017-01-21 NOTE — Telephone Encounter (Signed)
INR at goal  Stay on current coumadin dose

## 2017-01-21 NOTE — Telephone Encounter (Signed)
Patient has been informed.

## 2017-01-22 ENCOUNTER — Ambulatory Visit
Admission: RE | Admit: 2017-01-22 | Discharge: 2017-01-22 | Disposition: A | Discharge status: Home / Self Care | Payer: Medicare Other | Source: Ambulatory Visit | Attending: Family | Admitting: Family

## 2017-01-22 DIAGNOSIS — Z1239 Encounter for other screening for malignant neoplasm of breast: Secondary | ICD-10-CM

## 2017-01-22 DIAGNOSIS — Z1231 Encounter for screening mammogram for malignant neoplasm of breast: Secondary | ICD-10-CM | POA: Insufficient documentation

## 2017-01-26 ENCOUNTER — Telehealth: Payer: Self-pay | Admitting: Family

## 2017-01-26 NOTE — Telephone Encounter (Signed)
Pt called and stated that she received a no show fee for 12/23/16. Pt states that she was speaking with Fransisco Beau and M. Arnett and they decided to wait for her already scheduled appt on 8/10. Please advise, thank you!

## 2017-01-27 ENCOUNTER — Other Ambulatory Visit: Payer: Self-pay | Admitting: Family

## 2017-01-27 DIAGNOSIS — I4891 Unspecified atrial fibrillation: Secondary | ICD-10-CM

## 2017-01-27 NOTE — Telephone Encounter (Signed)
The patient's information has been submitted to charge correction for the removal of the no show fee.

## 2017-01-28 ENCOUNTER — Encounter: Payer: Self-pay | Admitting: Family

## 2017-01-28 LAB — PROTIME-INR

## 2017-02-02 ENCOUNTER — Telehealth: Payer: Self-pay | Admitting: Family

## 2017-02-02 NOTE — Telephone Encounter (Signed)
Call pt  INR in goal range  Continue current dose coumadin

## 2017-02-02 NOTE — Telephone Encounter (Signed)
patient has been informed

## 2017-02-05 ENCOUNTER — Telehealth: Payer: Self-pay | Admitting: Family

## 2017-02-05 ENCOUNTER — Encounter: Payer: Self-pay | Admitting: Family

## 2017-02-05 LAB — PROTIME-INR

## 2017-02-05 NOTE — Telephone Encounter (Signed)
Left message for patient to return call back.  

## 2017-02-05 NOTE — Telephone Encounter (Signed)
Pt called back returning your call. Thank you! °

## 2017-02-05 NOTE — Telephone Encounter (Signed)
INR at goal. Continue current regimen.  

## 2017-02-06 NOTE — Telephone Encounter (Signed)
Patient was informed of results.  Patient understood and no questions, comments, or concerns at this time.  

## 2017-02-09 NOTE — Telephone Encounter (Signed)
Call pt  INR in goal range 9/20

## 2017-02-09 NOTE — Telephone Encounter (Signed)
Patient has been notified

## 2017-02-10 DIAGNOSIS — Z23 Encounter for immunization: Secondary | ICD-10-CM | POA: Diagnosis not present

## 2017-02-12 DIAGNOSIS — Z7901 Long term (current) use of anticoagulants: Secondary | ICD-10-CM | POA: Diagnosis not present

## 2017-02-13 ENCOUNTER — Telehealth: Payer: Self-pay | Admitting: Radiology

## 2017-02-13 NOTE — Telephone Encounter (Signed)
Debbie from [facility] called with pt INR results from last night. Debbie stated pt INR was 1.9.

## 2017-02-16 NOTE — Telephone Encounter (Signed)
Patient was informed. Also she did miss a dose. Patient stated she will comply.

## 2017-02-16 NOTE — Telephone Encounter (Signed)
Please call pt  INR slightly low  Did she miss a dose?   Advise to stay at normal dose and recheck today

## 2017-02-20 LAB — POCT INR: INR: 2 — AB (ref <=1.1)

## 2017-02-24 ENCOUNTER — Telehealth: Payer: Self-pay | Admitting: Family

## 2017-02-24 NOTE — Telephone Encounter (Signed)
Patient has been made aware.

## 2017-02-24 NOTE — Telephone Encounter (Signed)
Call pt inr in prescribed range No changes

## 2017-03-01 LAB — POCT INR: INR: 1.7 — AB (ref <=1.1)

## 2017-03-02 ENCOUNTER — Telehealth: Payer: Self-pay | Admitting: Family

## 2017-03-02 NOTE — Telephone Encounter (Signed)
Patient has already been made aware. Please see tcon

## 2017-03-02 NOTE — Telephone Encounter (Signed)
Call pt INR low  Did she miss a dose??  If so, she needs to take extra half today for total of 1.5mg  coumadin x one day. Then back to her normal 1mg  per day

## 2017-03-02 NOTE — Telephone Encounter (Signed)
She did not miss a dosage. But she stated she will comply with directions.

## 2017-03-02 NOTE — Telephone Encounter (Signed)
[facility] called with an out of range inr for this patient. The lab was drawn yesterday 10/14 and the inr was 1.7 Please advise, thank you!  Call [phone]

## 2017-03-06 ENCOUNTER — Encounter: Payer: Self-pay | Admitting: Family

## 2017-03-06 LAB — PROTIME-INR: INR: 2.3 — AB (ref 0.9–1.1)

## 2017-03-10 ENCOUNTER — Telehealth: Payer: Self-pay | Admitting: Family

## 2017-03-10 NOTE — Telephone Encounter (Signed)
Call pt inr at goal

## 2017-03-10 NOTE — Telephone Encounter (Signed)
Patient has been made aware of results.

## 2017-03-12 ENCOUNTER — Encounter: Payer: Self-pay | Admitting: Family

## 2017-03-12 LAB — PROTIME-INR

## 2017-03-16 ENCOUNTER — Telehealth: Payer: Self-pay | Admitting: Family

## 2017-03-16 NOTE — Telephone Encounter (Signed)
Call pt  inr in range  Continue therapy

## 2017-03-16 NOTE — Telephone Encounter (Signed)
Pt has been informed.

## 2017-03-19 ENCOUNTER — Encounter: Payer: Self-pay | Admitting: Family

## 2017-03-19 DIAGNOSIS — D0462 Carcinoma in situ of skin of left upper limb, including shoulder: Secondary | ICD-10-CM | POA: Diagnosis not present

## 2017-03-19 DIAGNOSIS — Z7901 Long term (current) use of anticoagulants: Secondary | ICD-10-CM | POA: Diagnosis not present

## 2017-03-19 DIAGNOSIS — D485 Neoplasm of uncertain behavior of skin: Secondary | ICD-10-CM | POA: Diagnosis not present

## 2017-03-19 DIAGNOSIS — Z85828 Personal history of other malignant neoplasm of skin: Secondary | ICD-10-CM | POA: Diagnosis not present

## 2017-03-19 DIAGNOSIS — L821 Other seborrheic keratosis: Secondary | ICD-10-CM | POA: Diagnosis not present

## 2017-03-19 DIAGNOSIS — Z08 Encounter for follow-up examination after completed treatment for malignant neoplasm: Secondary | ICD-10-CM | POA: Diagnosis not present

## 2017-03-19 DIAGNOSIS — D1801 Hemangioma of skin and subcutaneous tissue: Secondary | ICD-10-CM | POA: Diagnosis not present

## 2017-03-19 LAB — PROTIME-INR

## 2017-03-23 ENCOUNTER — Telehealth: Payer: Self-pay | Admitting: Family

## 2017-03-23 NOTE — Telephone Encounter (Signed)
Call pt inr at goal

## 2017-03-23 NOTE — Telephone Encounter (Signed)
Patient has been informed.

## 2017-03-26 LAB — POCT INR: INR: 2 — AB (ref 0.9–1.1)

## 2017-03-30 ENCOUNTER — Telehealth: Payer: Self-pay | Admitting: Family

## 2017-03-30 NOTE — Telephone Encounter (Signed)
Call pt inr at goal

## 2017-03-30 NOTE — Telephone Encounter (Signed)
Patient has been made aware.

## 2017-04-02 LAB — POCT INR: INR: 3 — AB (ref 0.9–1.1)

## 2017-04-03 ENCOUNTER — Telehealth: Payer: Self-pay

## 2017-04-03 NOTE — Telephone Encounter (Signed)
Left voice mail to call back, HIPPA complaint .

## 2017-04-03 NOTE — Telephone Encounter (Signed)
Per Margaret INR at Goal 3.0 04/02/17 , in range continue current dose.  Left message to call.

## 2017-04-06 ENCOUNTER — Ambulatory Visit: Payer: Medicare Other | Admitting: Family

## 2017-04-06 NOTE — Telephone Encounter (Signed)
Patient advised of result and verbalized an understanding.  

## 2017-04-07 ENCOUNTER — Ambulatory Visit (INDEPENDENT_AMBULATORY_CARE_PROVIDER_SITE_OTHER): Payer: Medicare Other | Admitting: Internal Medicine

## 2017-04-07 ENCOUNTER — Encounter: Payer: Self-pay | Admitting: Internal Medicine

## 2017-04-07 VITALS — BP 126/70 | HR 97 | Temp 98.2°F | Resp 15 | Ht 62.0 in | Wt 135.2 lb

## 2017-04-07 DIAGNOSIS — E119 Type 2 diabetes mellitus without complications: Secondary | ICD-10-CM | POA: Diagnosis not present

## 2017-04-07 DIAGNOSIS — D510 Vitamin B12 deficiency anemia due to intrinsic factor deficiency: Secondary | ICD-10-CM | POA: Diagnosis not present

## 2017-04-07 DIAGNOSIS — N3000 Acute cystitis without hematuria: Secondary | ICD-10-CM

## 2017-04-07 DIAGNOSIS — E1140 Type 2 diabetes mellitus with diabetic neuropathy, unspecified: Secondary | ICD-10-CM | POA: Diagnosis not present

## 2017-04-07 DIAGNOSIS — E876 Hypokalemia: Secondary | ICD-10-CM

## 2017-04-07 DIAGNOSIS — R200 Anesthesia of skin: Secondary | ICD-10-CM

## 2017-04-07 DIAGNOSIS — R5383 Other fatigue: Secondary | ICD-10-CM

## 2017-04-07 DIAGNOSIS — Z23 Encounter for immunization: Secondary | ICD-10-CM | POA: Diagnosis not present

## 2017-04-07 DIAGNOSIS — R739 Hyperglycemia, unspecified: Secondary | ICD-10-CM | POA: Diagnosis not present

## 2017-04-07 DIAGNOSIS — R202 Paresthesia of skin: Secondary | ICD-10-CM

## 2017-04-07 LAB — POCT URINALYSIS DIPSTICK
Bilirubin, UA: NEGATIVE
Glucose, UA: NEGATIVE
Ketones, UA: NEGATIVE
Nitrite, UA: NEGATIVE
Protein, UA: NEGATIVE
Spec Grav, UA: 1.01 (ref 1.010–1.025)
Urobilinogen, UA: 0.2 E.U./dL
pH, UA: 5.5 (ref 5.0–8.0)

## 2017-04-07 MED ORDER — ZOSTER VAC RECOMB ADJUVANTED 50 MCG/0.5ML IM SUSR: 0.5000 mL | Freq: Once | INTRAMUSCULAR | 1 refills | Status: AC

## 2017-04-07 NOTE — Patient Instructions (Signed)
It was very good to see you!  You are definitely thriving!  We will call you with the results in a few days   I'll see you in 3 months

## 2017-04-07 NOTE — Progress Notes (Signed)
Subjective:  Patient ID: Kara [name], female    DOB: 09-09-1922  Age: 83 y.o. MRN: [id number]  CC: The primary encounter diagnosis was Elevated blood sugar. Diagnoses of Numbness and tingling of both feet, Fatigue, unspecified type, Need for 23-polyvalent pneumococcal polysaccharide vaccine, Pernicious anemia, Acute cystitis without hematuria, Controlled type 2 diabetes mellitus without complication, without long-term current use of insulin ([place]), Hypokalemia, and Type 2 diabetes mellitus with diabetic neuropathy, without long-term current use of insulin ([place]) were also pertinent to this visit.  HPI Kara [name] presents for follow  Up on hypertension,  CAD na atrial fibrillation managed with metoprolol,  cartia XT and coumadin .  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  Has chronic diarrhea,  dosesn't travel anymore,  Has a good social life,  At [place] ,  Content with her sedentary   Mammogram normal sept 2018 .  DEXA April 2017   Has a list of concerns today  Fatigue. Low potassium can't take supplements.   Worried about another UTI,   Feet hurt when barefoot.  Worried about neuropathy.    Carpal tunnel surgery did not relieve.   Outpatient Medications Prior to Visit  Medication Sig Dispense Refill  . CARTIA XT 120 MG 24 hr capsule   0  . cholecalciferol (VITAMIN D) 1000 UNITS tablet Take 1,000 Units by mouth daily.    Marland Kitchen COUMADIN 1 MG tablet TAKE ONE TABLET BY MOUTH DAILY AT 6PM 45 tablet 1  . cyanocobalamin (,VITAMIN B-12,) 1000 MCG/ML injection INJECT 1 ML  INTO THE MUSCLE EVERY 30 DAYS. 1 mL 10  . diltiazem (CARDIZEM SR) 120 MG 12 hr capsule Take 1 capsule (120 mg total) by mouth every morning. 90 capsule 1  . diphenoxylate-atropine (LOMOTIL) 2.5-0.025 MG tablet TAKE 1 TABLET TWO TIMES DAILY AS NEEDED FOR DIARHREA 60 tablet 1  . hydrocortisone-pramoxine (ANALPRAM-HC) 2.5-1 % rectal cream APPLY RECTALLY THREE TIMES DAILY 30  g 0  . Hypromellose (GENTEAL) 0.3 % SOLN Place 1 drop into both eyes daily as needed (dryness).     Marland Kitchen ketoconazole (NIZORAL) 2 % cream   1  . metoprolol (LOPRESSOR) 50 MG tablet 25mg  in the morning and 25mg  in the evening 60 tablet 4  . ondansetron (ZOFRAN) 4 MG tablet Take 1 tablet (4 mg total) by mouth every 8 (eight) hours as needed for nausea or vomiting. 30 tablet 0  . triamcinolone cream (KENALOG) 0.1 % Apply 1 application topically 2 (two) times daily. (Patient taking differently: Apply 1 application topically 2 (two) times daily as needed. ) 30 g 0  . estradiol (ESTRACE) 0.1 MG/GM vaginal cream Place 1 Applicatorful vaginally at bedtime. (Patient not taking: Reported on 04/07/2017) 42.5 g 0  . HYDROcodone-acetaminophen (NORCO) 5-325 MG tablet Take 1-2 tablets by mouth every 4 (four) hours as needed for moderate pain. MAXIMUM TOTAL ACETAMINOPHEN DOSE IS 4000 MG PER DAY (Patient not taking: Reported on 04/07/2017) 40 tablet 0  . lidocaine (LIDODERM) 5 % Place 1 patch onto the skin daily. Remove & Discard patch within 12 hours or as directed by MD (Patient not taking: Reported on 04/07/2017) 30 patch 0  . methylPREDNISolone (MEDROL DOSEPAK) 4 MG TBPK tablet   0   No facility-administered medications prior to visit.     Review of Systems;  Patient denies headache, fevers, malaise, unintentional weight loss, skin rash, eye pain, sinus congestion and sinus pain, sore throat, dysphagia,  hemoptysis , cough, dyspnea,  wheezing, chest pain, palpitations, orthopnea, edema, abdominal pain, nausea, melena, diarrhea, constipation, flank pain, dysuria, hematuria, urinary  Frequency, nocturia, numbness, tingling, seizures,  Focal weakness, Loss of consciousness,  Tremor, insomnia, depression, anxiety, and suicidal ideation.      Objective:  BP 126/70 (BP Location: Left Arm, Patient Position: Sitting, Cuff Size: Normal)   Pulse 97   Temp 98.2 F (36.8 C) (Oral)   Resp 15   Ht 5\' 2"  (1.575 m)   Wt  135 lb 3.2 oz (61.3 kg)   SpO2 95%   BMI 24.73 kg/m   BP Readings from Last 3 Encounters:  04/07/17 126/70  12/31/16 (!) 128/58  12/15/16 122/66    Wt Readings from Last 3 Encounters:  04/07/17 135 lb 3.2 oz (61.3 kg)  12/31/16 132 lb 6.4 oz (60.1 kg)  12/15/16 134 lb 9.6 oz (61.1 kg)    General appearance: alert, cooperative and appears stated age Ears: normal TM's and external ear canals both ears Throat: lips, mucosa, and tongue normal; teeth and gums normal Neck: no adenopathy, no carotid bruit, supple, symmetrical, trachea midline and thyroid not enlarged, symmetric, no tenderness/mass/nodules Back: symmetric, no curvature. ROM normal. No CVA tenderness. Lungs: clear to auscultation bilaterally Heart: regular rate and rhythm, S1, S2 normal, no murmur, click, rub or gallop Abdomen: soft, non-tender; bowel sounds normal; no masses,  no organomegaly Pulses: 2+ and symmetric Skin: Skin color, texture, turgor normal. No rashes or lesions Lymph nodes: Cervical, supraclavicular, and axillary nodes normal.  Lab Results  Component Value Date   HGBA1C 6.7 (H) 04/07/2017   HGBA1C 6.4 06/17/2016   HGBA1C 6.4 05/29/2015    Lab Results  Component Value Date   CREATININE 0.78 12/15/2016   CREATININE 0.87 09/15/2016   CREATININE 0.93 06/17/2016    Lab Results  Component Value Date   WBC 13.5 (H) 06/17/2016   HGB 14.2 06/17/2016   HCT 42.3 06/17/2016   PLT 212.0 06/17/2016   GLUCOSE 155 (H) 12/15/2016   CHOL 142 10/11/2013   TRIG 194.0 (H) 10/11/2013   HDL 43.10 10/11/2013   LDLDIRECT 86.3 06/30/2012   LDLCALC 60 10/11/2013   ALT 14 09/15/2016   AST 18 09/15/2016   NA 142 12/15/2016   K 3.2 (L) 12/15/2016   CL 102 12/15/2016   CREATININE 0.78 12/15/2016   BUN 16 12/15/2016   CO2 30 12/15/2016   TSH 3.35 04/07/2017   INR 3.0 (A) 04/02/2017   HGBA1C 6.7 (H) 04/07/2017   MICROALBUR 0.1 12/16/2012    Mm Screening Breast Tomo Bilateral  Result Date:  01/22/2017 CLINICAL DATA:  Screening. EXAM: 2D DIGITAL SCREENING BILATERAL MAMMOGRAM WITH CAD AND ADJUNCT TOMO COMPARISON:  Previous exam(s). ACR Breast Density Category c: The breast tissue is heterogeneously dense, which may obscure small masses. FINDINGS: There are no findings suspicious for malignancy. Images were processed with CAD. IMPRESSION: No mammographic evidence of malignancy. A result letter of this screening mammogram will be mailed directly to the patient. RECOMMENDATION: Screening mammogram in one year. (Code:SM-B-01Y) BI-RADS CATEGORY  1: Negative. Electronically Signed   By: Lovey Newcomer M.D.   On: 01/22/2017 11:17    Assessment & Plan:   Problem List Items Addressed This Visit    Controlled type 2 diabetes mellitus without complication, without long-term current use of insulin ([place]) - Primary    She has developed type 2 DM from insulin resistance.  She does not require medication at this time Lab Results  Component Value Date   HGBA1C 6.7 (  H) 04/07/2017   Lab Results  Component Value Date   MICROALBUR 0.1 12/16/2012          Relevant Orders   Microalbumin / creatinine urine ratio   Fatigue    Likely multifactorial , including atrial fibrillation b12 deficiency,  And age.  Nothing reversial   Lab Results  Component Value Date   TSH 3.35 04/07/2017   Lab Results  Component Value Date   VITAMINB12 321 04/07/2017   Lab Results  Component Value Date   WBC 13.5 (H) 06/17/2016   HGB 14.2 06/17/2016   HCT 42.3 06/17/2016   MCV 91.8 06/17/2016   PLT 212.0 06/17/2016   Lab Results  Component Value Date   CREATININE 0.78 12/15/2016         Relevant Orders   Urine Microscopic Only (Completed)   Urine Culture (Completed)   POCT urinalysis dipstick (Completed)   Vitamin B12 (Completed)   TSH (Completed)   Hypokalemia    Chronic ,  Managed with oral  liquid supplementation .  Repeat potassium is due   Lab Results  Component Value Date   NA 142 12/15/2016    K 3.2 (L) 12/15/2016   CL 102 12/15/2016   CO2 30 12/15/2016         Relevant Orders   Basic metabolic panel   Magnesium   Pernicious anemia    Level is still borderline low despite supplementation   Lab Results  Component Value Date   VITAMINB12 321 04/07/2017         Type 2 diabetes mellitus with diabetic neuropathy, unspecified ([place])    Presumed,  Given normal screenings for thyroid , B12 and folate.        UTI (urinary tract infection)    UA is suggestive of UTI but symptoms are frequent and recurrent.  Will await culture results.        Other Visit Diagnoses    Numbness and tingling of both feet       Relevant Orders   Vitamin B12 (Completed)   TSH (Completed)   Need for 23-polyvalent pneumococcal polysaccharide vaccine       Relevant Orders   Pneumococcal polysaccharide vaccine 23-valent greater than or equal to 2yo subcutaneous/IM (Completed)      I have discontinued Daleen Squibb. [place]'s lidocaine, HYDROcodone-acetaminophen, and methylPREDNISolone. I am also having her start on Zoster Vaccine Adjuvanted. Additionally, I am having her maintain her cholecalciferol, Hypromellose, cyanocobalamin, hydrocortisone-pramoxine, triamcinolone cream, estradiol, ondansetron, metoprolol tartrate, diltiazem, CARTIA XT, ketoconazole, diphenoxylate-atropine, and COUMADIN.  Meds ordered this encounter  Medications  . Zoster Vaccine Adjuvanted [facility]) injection    Sig: Inject 0.5 mLs into the muscle once for 1 dose.    Dispense:  1 each    Refill:  1    Medications Discontinued During This Encounter  Medication Reason  . HYDROcodone-acetaminophen (NORCO) 5-325 MG tablet Patient has not taken in last 30 days  . lidocaine (LIDODERM) 5 % Patient has not taken in last 30 days  . methylPREDNISolone (MEDROL DOSEPAK) 4 MG TBPK tablet Completed Course    Follow-up: Return in about 3 months (around 07/08/2017) for with Dr Derrel Nip .   Crecencio Mc, MD

## 2017-04-08 LAB — VITAMIN B12: Vitamin B-12: 321 pg/mL (ref 211–911)

## 2017-04-08 LAB — HEMOGLOBIN A1C: Hgb A1c MFr Bld: 6.7 % — ABNORMAL HIGH (ref 4.6–6.5)

## 2017-04-08 LAB — URINE CULTURE
MICRO NUMBER:: 81309184
SPECIMEN QUALITY:: ADEQUATE

## 2017-04-08 LAB — URINALYSIS, MICROSCOPIC ONLY

## 2017-04-08 LAB — TSH: TSH: 3.35 u[IU]/mL (ref 0.35–4.50)

## 2017-04-09 NOTE — Assessment & Plan Note (Addendum)
UA is suggestive of UTI but symptoms are frequent and recurrent.  Will await culture results.

## 2017-04-09 NOTE — Assessment & Plan Note (Signed)
Likely multifactorial , including atrial fibrillation b12 deficiency,  And age.  Nothing reversial   Lab Results  Component Value Date   TSH 3.35 04/07/2017   Lab Results  Component Value Date   VITAMINB12 321 04/07/2017   Lab Results  Component Value Date   WBC 13.5 (H) 06/17/2016   HGB 14.2 06/17/2016   HCT 42.3 06/17/2016   MCV 91.8 06/17/2016   PLT 212.0 06/17/2016   Lab Results  Component Value Date   CREATININE 0.78 12/15/2016

## 2017-04-09 NOTE — Assessment & Plan Note (Signed)
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  She does not require medication at this time Lab Results  Component Value Date   HGBA1C 6.7 (H) 04/07/2017   Lab Results  Component Value Date   MICROALBUR 0.1 12/16/2012

## 2017-04-09 NOTE — Assessment & Plan Note (Signed)
Level is still borderline low despite supplementation   Lab Results  Component Value Date   VITAMINB12 321 04/07/2017

## 2017-04-09 NOTE — Assessment & Plan Note (Signed)
Presumed,  Given normal screenings for thyroid , B12 and folate.

## 2017-04-09 NOTE — Assessment & Plan Note (Signed)
Chronic ,  Managed with oral  liquid supplementation .  Repeat potassium is due   Lab Results  Component Value Date   NA 142 12/15/2016   K 3.2 (L) 12/15/2016   CL 102 12/15/2016   CO2 30 12/15/2016

## 2017-04-12 ENCOUNTER — Telehealth: Payer: Self-pay | Admitting: Internal Medicine

## 2017-04-12 NOTE — Telephone Encounter (Signed)
Coumadin level is therapeutic,  Continue current regimen and repeat PT/INR in one month 

## 2017-04-13 NOTE — Telephone Encounter (Signed)
Patient understands.

## 2017-04-15 DIAGNOSIS — D0462 Carcinoma in situ of skin of left upper limb, including shoulder: Secondary | ICD-10-CM | POA: Diagnosis not present

## 2017-04-16 ENCOUNTER — Other Ambulatory Visit: Payer: Self-pay | Admitting: *Deleted

## 2017-04-16 ENCOUNTER — Encounter: Payer: Self-pay | Admitting: Family

## 2017-04-16 DIAGNOSIS — L905 Scar conditions and fibrosis of skin: Secondary | ICD-10-CM | POA: Diagnosis not present

## 2017-04-16 DIAGNOSIS — Z7901 Long term (current) use of anticoagulants: Secondary | ICD-10-CM | POA: Diagnosis not present

## 2017-04-16 DIAGNOSIS — I4891 Unspecified atrial fibrillation: Secondary | ICD-10-CM

## 2017-04-16 LAB — PROTIME-INR

## 2017-04-16 MED ORDER — WARFARIN SODIUM 1 MG PO TABS: ORAL_TABLET | ORAL | 1 refills | Status: DC

## 2017-04-16 MED ORDER — CYANOCOBALAMIN 1000 MCG/ML IJ SOLN: INTRAMUSCULAR | 6 refills | Status: DC

## 2017-04-16 NOTE — Telephone Encounter (Signed)
Medications refilled. Appts: 12/3 lab             2/26 ov

## 2017-04-16 NOTE — Telephone Encounter (Signed)
Copied from [place]. Topic: Quick Communication - Rx Refill/Question >> Apr 16, 2017  9:16 AM Carolyn Stare wrote: Has the patient contacted their pharmacy {yes  RX; Coumadin, cyanocobalamin (,VITAMIN B-12,) 1000 MCG/ML injection     Pharmacy [facility]

## 2017-04-20 ENCOUNTER — Other Ambulatory Visit (INDEPENDENT_AMBULATORY_CARE_PROVIDER_SITE_OTHER): Payer: Medicare Other

## 2017-04-20 DIAGNOSIS — E119 Type 2 diabetes mellitus without complications: Secondary | ICD-10-CM

## 2017-04-20 DIAGNOSIS — E876 Hypokalemia: Secondary | ICD-10-CM | POA: Diagnosis not present

## 2017-04-20 LAB — BASIC METABOLIC PANEL
BUN: 20 mg/dL (ref 6–23)
CO2: 25 mEq/L (ref 19–32)
Calcium: 9.1 mg/dL (ref 8.4–10.5)
Chloride: 104 mEq/L (ref 96–112)
Creatinine, Ser: 0.87 mg/dL (ref 0.40–1.20)
GFR: 64.64 mL/min (ref >60.00)
Glucose, Bld: 205 mg/dL — ABNORMAL HIGH (ref 70–99)
Potassium: 3.5 mEq/L (ref 3.5–5.1)
Sodium: 139 mEq/L (ref 135–145)

## 2017-04-20 LAB — MAGNESIUM: Magnesium: 1.7 mg/dL (ref 1.5–2.5)

## 2017-04-20 NOTE — Addendum Note (Signed)
Addended by: Arby Barrette on: 04/20/2017 11:10 AM   Modules accepted: Orders

## 2017-04-20 NOTE — Addendum Note (Signed)
Addended by: Arby Barrette on: 04/20/2017 12:55 PM   Modules accepted: Orders

## 2017-04-21 ENCOUNTER — Other Ambulatory Visit: Payer: Self-pay | Admitting: Family

## 2017-04-21 DIAGNOSIS — I4891 Unspecified atrial fibrillation: Secondary | ICD-10-CM

## 2017-04-21 LAB — MICROALBUMIN / CREATININE URINE RATIO
Creatinine,U: 61.6 mg/dL
Microalb Creat Ratio: 1.6 mg/g (ref 0.0–30.0)
Microalb, Ur: 1 mg/dL (ref 0.0–1.9)

## 2017-04-22 DIAGNOSIS — I495 Sick sinus syndrome: Secondary | ICD-10-CM | POA: Diagnosis not present

## 2017-05-01 ENCOUNTER — Telehealth: Payer: Self-pay | Admitting: Family

## 2017-05-01 LAB — PROTIME-INR

## 2017-05-01 NOTE — Telephone Encounter (Signed)
Patient gave verbal understanding ?

## 2017-05-01 NOTE — Telephone Encounter (Signed)
rec'd call from MD-INR with critical lab: INR 1.4.  Will forward results to [place].

## 2017-05-01 NOTE — Telephone Encounter (Signed)
Patient says she is currently taking 1 mg daily but has not felt good the last few days and has missed a few doses. She has a cold but feels better today than she has all week. She is finally starting to get her strength back and feel back to normal.

## 2017-05-01 NOTE — Telephone Encounter (Signed)
Increase her dose to 2 mg daily for 3 days,  Then resume 1 mg daily after that and recheck an INR in 2 weeks

## 2017-05-07 ENCOUNTER — Telehealth: Payer: Self-pay | Admitting: Family

## 2017-05-07 NOTE — Telephone Encounter (Signed)
Patient notified and voiced understanding, she does weekly INR due to home INR is set up as weekly.

## 2017-05-07 NOTE — Telephone Encounter (Signed)
Makes sure she is using a pill box.  Increase dose to 2 mg daily  And repeat INR in 2 weeks

## 2017-05-07 NOTE — Telephone Encounter (Signed)
MD INR called report patient INR of 1.8 patient currently taking coumadin 1 mg 4 days a week, 2 mg 3 days per week , patient believes she missed one dose of the 1 mg regimen.

## 2017-05-08 NOTE — Telephone Encounter (Signed)
Copied from [place] [id number]. Topic: Quick Communication - Rx Refill/Question >> May 08, 2017 10:37 AM Marin Olp L wrote: Has the patient contacted their pharmacy? No. (patient just picked up but she was advised to increase dose) (Agent: If no, request that the patient contact the pharmacy for the refill.) Preferred Pharmacy (with phone number or street name): [place], [place] Agent: Please be advised that RX refills may take up to 3 business days. We ask that you follow-up with your pharmacy. Refill warfarin (COUMADIN) 1 MG tablet (b/c Dr. Derrel Nip advised her to increase her dosage

## 2017-05-08 NOTE — Telephone Encounter (Deleted)
Copied from [place] [phone]. Topic: Quick Communication - Rx Refill/Question >> May 08, 2017 10:37 AM Marin Olp L wrote: Has the patient contacted their pharmacy? No. (patient just picked up but she was advised to increase dose) (Agent: If no, request that the patient contact the pharmacy for the refill.) Preferred Pharmacy (with phone number or street name): [place], [place] Agent: Please be advised that RX refills may take up to 3 business days. We ask that you follow-up with your pharmacy. Refill warfarin (COUMADIN) 1 MG tablet (b/c Dr. Derrel Nip advised her to increase her dosage

## 2017-05-08 NOTE — Telephone Encounter (Signed)
Spoke with pt and she stated that she was afraid that she may run out of her coumadin since she has doubled her dose. I explained to the pt that she should not run out because she has another refill that she can pick up on 05/16/2017. Pt gave a verbal understanding.

## 2017-05-08 NOTE — Telephone Encounter (Signed)
Requesting Coumadin refill. Thanks.

## 2017-05-14 ENCOUNTER — Encounter: Payer: Self-pay | Admitting: Family

## 2017-05-14 ENCOUNTER — Telehealth: Payer: Self-pay | Admitting: Family

## 2017-05-14 DIAGNOSIS — Z7901 Long term (current) use of anticoagulants: Secondary | ICD-10-CM | POA: Diagnosis not present

## 2017-05-14 LAB — PROTIME-INR

## 2017-05-14 NOTE — Telephone Encounter (Signed)
I called pt back around 5-6 pm afternoon. Advised her to hold dose tonight she has not taken yet and call clinic tomorrow with INR read  She is taking 2 mg qd she was on 1 mg qd. Will likely reduce dose as pt states she never takes more the 0.5 mg extra in the past when INR has been subtherapeutic.  Total weekly dose was 7 then with increase 14. Total INR dose for week likely needs to be 9-10 to avoid supratherapeutic range   Fransisco Beau please call pt tomorrow am and check what her INR is today and message me back   Thanks [place]

## 2017-05-14 NOTE — Progress Notes (Signed)
Telephone call:  Of note in reference to call 05/14/17 pt reports she has not been on antibiotics recently but diet changed somewhat with the holidays   [place]

## 2017-05-14 NOTE — Telephone Encounter (Signed)
Please advise. Range is 2.0-3.0.

## 2017-05-14 NOTE — Telephone Encounter (Signed)
Copied from [place]. Topic: General - Other >> May 14, 2017  2:23 PM Neva Seat wrote: Jackelyn Poling w/ [facility] - [phone] called to give pt's INR - 4.6

## 2017-05-15 ENCOUNTER — Emergency Department: Payer: Medicare Other

## 2017-05-15 ENCOUNTER — Emergency Department
Admission: EM | Admit: 2017-05-15 | Discharge: 2017-05-15 | Disposition: A | Discharge status: Home / Self Care | Payer: Medicare Other | Attending: Emergency Medicine | Admitting: Emergency Medicine

## 2017-05-15 ENCOUNTER — Telehealth: Payer: Self-pay | Admitting: Internal Medicine

## 2017-05-15 ENCOUNTER — Encounter: Payer: Self-pay | Admitting: Emergency Medicine

## 2017-05-15 ENCOUNTER — Ambulatory Visit: Payer: Self-pay | Admitting: *Deleted

## 2017-05-15 ENCOUNTER — Other Ambulatory Visit: Payer: Self-pay

## 2017-05-15 DIAGNOSIS — R791 Abnormal coagulation profile: Secondary | ICD-10-CM | POA: Diagnosis not present

## 2017-05-15 DIAGNOSIS — Z950 Presence of cardiac pacemaker: Secondary | ICD-10-CM | POA: Diagnosis not present

## 2017-05-15 DIAGNOSIS — Z87891 Personal history of nicotine dependence: Secondary | ICD-10-CM | POA: Insufficient documentation

## 2017-05-15 DIAGNOSIS — R0602 Shortness of breath: Secondary | ICD-10-CM

## 2017-05-15 DIAGNOSIS — R079 Chest pain, unspecified: Secondary | ICD-10-CM | POA: Diagnosis not present

## 2017-05-15 DIAGNOSIS — I700 Atherosclerosis of aorta: Secondary | ICD-10-CM | POA: Diagnosis not present

## 2017-05-15 DIAGNOSIS — Z7901 Long term (current) use of anticoagulants: Secondary | ICD-10-CM | POA: Insufficient documentation

## 2017-05-15 DIAGNOSIS — I10 Essential (primary) hypertension: Secondary | ICD-10-CM | POA: Insufficient documentation

## 2017-05-15 DIAGNOSIS — Z79899 Other long term (current) drug therapy: Secondary | ICD-10-CM | POA: Insufficient documentation

## 2017-05-15 DIAGNOSIS — I2510 Atherosclerotic heart disease of native coronary artery without angina pectoris: Secondary | ICD-10-CM | POA: Diagnosis not present

## 2017-05-15 DIAGNOSIS — E119 Type 2 diabetes mellitus without complications: Secondary | ICD-10-CM | POA: Insufficient documentation

## 2017-05-15 LAB — PROTIME-INR
INR: 3.44
Prothrombin Time: 34.4 seconds — ABNORMAL HIGH (ref 11.4–15.2)

## 2017-05-15 LAB — BASIC METABOLIC PANEL
Anion gap: 7 (ref 5–15)
BUN: 17 mg/dL (ref 6–20)
CO2: 29 mmol/L (ref 22–32)
Calcium: 8.7 mg/dL — ABNORMAL LOW (ref 8.9–10.3)
Chloride: 102 mmol/L (ref 101–111)
Creatinine, Ser: 0.91 mg/dL (ref 0.44–1.00)
GFR calc Af Amer: >60 mL/min (ref >60)
GFR calc non Af Amer: 53 mL/min — ABNORMAL LOW (ref >60)
Glucose, Bld: 259 mg/dL — ABNORMAL HIGH (ref 65–99)
Potassium: 3.3 mmol/L — ABNORMAL LOW (ref 3.5–5.1)
Sodium: 138 mmol/L (ref 135–145)

## 2017-05-15 LAB — CBC
HCT: 43 % (ref 35.0–47.0)
Hemoglobin: 14.4 g/dL (ref 12.0–16.0)
MCH: 30.3 pg (ref 26.0–34.0)
MCHC: 33.5 g/dL (ref 32.0–36.0)
MCV: 90.4 fL (ref 80.0–100.0)
Platelets: 212 10*3/uL (ref 150–440)
RBC: 4.76 MIL/uL (ref 3.80–5.20)
RDW: 13.4 % (ref 11.5–14.5)
WBC: 10.6 10*3/uL (ref 3.6–11.0)

## 2017-05-15 LAB — APTT: aPTT: 46 seconds — ABNORMAL HIGH (ref 24–36)

## 2017-05-15 LAB — TROPONIN I
Troponin I: <0.03 ng/mL (ref <0.03)
Troponin I: <0.03 ng/mL (ref <0.03)

## 2017-05-15 MED ORDER — IOPAMIDOL (ISOVUE-370) INJECTION 76%
60.0000 mL | Freq: Once | INTRAVENOUS | Status: AC | PRN
  Administered 2017-05-15: 60 mL via INTRAVENOUS

## 2017-05-15 NOTE — Discharge Instructions (Signed)
Your labs and imaging were negative here but you must follow up with your cardiologist in the next 2 days for further evaluation as this could still be from your heart. Return to the ER if you have any further episodes of chest pain or shortness of breath.

## 2017-05-15 NOTE — Telephone Encounter (Signed)
Will f/u with INR on Monday She was instructed to hold dose x 2 days Thursday 12/27 (see my note) and Friday 12/28 (see ED note)   Will call patient Monday.   What is INR?    if INR subtherapeutic <2.0 on 1 mg qd will have her increase to 1 mg Monday-Sat and 2 mg on Sunday.   When she was subtherapeutic the last time 1.8 she she had been taking 2 mg qd. Which led her to supratherapeutic INR 4.6 on 12/27 and 12/28   Dr. Kelly [facility]

## 2017-05-15 NOTE — Telephone Encounter (Signed)
Patient reports she woke up early am- 4:00 with SOB. Patient states she had trouble getting air in and slight tightness. Patient reports phlegm in throat in throat in mornings- increased amount. Patient got up to the bathroom several times and propped herself up and that seemed to help. Patient states she has had one other episode- lasting not as long. Patient states she is breathing better now- she feels a little short of breath- but nothing like she experienced during the night. INR-4.4 Contacted the office and there are no openings- also checked other locations and there are no opening. Per flow coordinator- patient has been advised to go to ED to be evaluated.Patient agrees to go.  Reason for Disposition . [1] MILD difficulty breathing (e.g., minimal/no SOB at rest, SOB with walking, pulse <100) AND [2] NEW-onset or WORSE than normal  Answer Assessment - Initial Assessment Questions 1. RESPIRATORY STATUS: "Describe your breathing?" (e.g., wheezing, shortness of breath, unable to speak, severe coughing)      Feels a little short every few breaths 2. ONSET: "When did this breathing problem begin?"      2 nights ago- during the early morning hours while sleeping 3. PATTERN "Does the difficult breathing come and go, or has it been constant since it started?"      Comes and goes- patient states she felt it when she got up to go to the bathroom. Possible palpitations at the time 4. SEVERITY: "How bad is your breathing?" (e.g., mild, moderate, severe)    - MILD: No SOB at rest, mild SOB with walking, speaks normally in sentences, can lay down, no retractions, pulse < 100.    - MODERATE: SOB at rest, SOB with minimal exertion and prefers to sit, cannot lie down flat, speaks in phrases, mild retractions, audible wheezing, pulse 100-120.    - SEVERE: Very SOB at rest, speaks in single words, struggling to breathe, sitting hunched forward, retractions, pulse > 120      Moderate- patient states she wanted to  lay down 5. RECURRENT SYMPTOM: "Have you had difficulty breathing before?" If so, ask: "When was the last time?" and "What happened that time?"      Patient states she has not had a feeling like this before- this was more disturbing to her  6. CARDIAC HISTORY: "Do you have any history of heart disease?" (e.g., heart attack, angina, bypass surgery, angioplasty)      Pacemaker in place 7. LUNG HISTORY: "Do you have any history of lung disease?"  (e.g., pulmonary embolus, asthma, emphysema)     no 8. CAUSE: "What do you think is causing the breathing problem?"      no 9. OTHER SYMPTOMS: "Do you have any other symptoms? (e.g., dizziness, runny nose, cough, chest pain, fever)     no 10. PREGNANCY: "Is there any chance you are pregnant?" "When was your last menstrual period?"       n/a 11. TRAVEL: "Have you traveled out of the country in the last month?" (e.g., travel history, exposures)       no  Protocols used: BREATHING DIFFICULTY-A-AH

## 2017-05-15 NOTE — Telephone Encounter (Signed)
Patient was triaged today for SOB- she was to call to report her INR.   She did do reading while on the phone:   INR- 4.4

## 2017-05-15 NOTE — Telephone Encounter (Signed)
Pt is in the ED and they just took her INR and it was 3.4 .  Pt wanted the dr to know that they will be taking it again shortly.

## 2017-05-15 NOTE — ED Triage Notes (Signed)
Pt to ed with c/o sob that started at 4 am. Pt states she got up to go to the restroom and had severe sob.  Pt states sob worse when laying flat.  Denies chest pain.

## 2017-05-15 NOTE — Telephone Encounter (Signed)
Left message for patient to return call to see what her INR was. P[facility] may pass the information.

## 2017-05-15 NOTE — Telephone Encounter (Signed)
See previous note patient going to ER for SOB FYI. INR today 4.4.

## 2017-05-15 NOTE — ED Provider Notes (Signed)
[facility] Emergency Department Provider Note  ____________________________________________  Time seen: Approximately 12:47 PM  I have reviewed the triage vital signs and the nursing notes.   HISTORY  Chief Complaint Shortness of Breath   HPI Kara [name] is a 83 y.o. female with a history of sick sinus syndrome status post pacemaker, afib on coumadin, CAD, diabetes, HTN, HLD who presents for evaluation of SOB. patient reports that she woke up at 4 AM to go to the bathroom and developed chest tightness associated with severe shortness of breath. She reports that it took her several hours to be able to fall asleep again and when she woke up at 7:30 for shortness of breath was still present which prompted her visit to the emergency room. She endorses a similar episode yesterday morning however that lasted an hour and resolved. At this time she is asymptomatic. She denies any personal or family history of blood clots, hemoptysis, exogenous hormones, leg pain. She does have swelling of her right lower extremity which is chronic from several years. She endorses that her Coumadin dose has been adjusted up and down over the last few weeks since her INR has been difficult to adjust. She denies diaphoresis, nausea, vomiting, abdominal pain, fever, cough.   Past Medical History:  Diagnosis Date  . Allergy   . Atrial fibrillation ([place])   . CAD (coronary artery disease)   . Cancer (HCC)    UTERINE  . Chronic anticoagulation   . Chronic diarrhea   . Decubitus ulcer    sacral region  . Diabetes ([place])    diet controlled  . Dysuria   . Edema of lower extremity    mainly right foot, slightly in left foot.  . Fibrocystic breast disease   . GERD (gastroesophageal reflux disease)   . Glaucoma   . Glaucoma   . Hematuria   . Hemorrhoids   . History of colon polyps   . History of pancreatitis   . Hyperlipidemia   . Hypertension   . Hypokalemia   . IBS (irritable  bowel syndrome)   . Microscopic hematuria   . Mitral valve regurgitation   . Osteoarthritis    fingers  . Pernicious anemia   . Plantar fasciitis   . Recurrent UTI   . Skin cancer   . Sleep apnea, obstructive   . Vaginal atrophy   . Vitamin D deficiency   . Yeast vaginitis     Patient Active Problem List   Diagnosis Date Noted  . Essential hypertension 11/11/2016  . Peripheral artery disease ([place]) 11/11/2016  . Leg swelling 06/17/2016  . Type 2 diabetes mellitus with diabetic neuropathy, unspecified ([place]) 06/17/2016  . Diarrhea 06/17/2016  . Urinary frequency 12/27/2015  . Atrophic vaginitis 09/26/2015  . Umbilical hernia without obstruction and without gangrene 06/26/2015  . Controlled type 2 diabetes mellitus without complication, without long-term current use of insulin ([place]) 05/29/2015  . Carpal tunnel syndrome of right wrist 05/29/2015  . Chronic fatigue 05/01/2015  . Left leg pain 05/01/2015  . Closed fracture of part of upper end of humerus 01/29/2015  . Skin lesion 01/29/2015  . Recurrent UTI 11/21/2014  . Vaginal atrophy 11/21/2014  . Urethral caruncle 11/21/2014  . Mitral valve regurgitation 05/09/2014  . UTI (urinary tract infection) 03/09/2014  . History of pancreatitis 03/09/2014  . Fatigue 03/09/2014  . Hypokalemia 04/12/2013  . Medicare annual wellness visit, subsequent 10/06/2012  . Hemorrhoids 06/08/2012  . Osteoarthritis 03/08/2012  . Screening  for breast cancer 03/08/2012  . Pernicious anemia 02/04/2012  . History of colectomy 02/04/2012  . Vitamin D deficiency 07/30/2011  . Chronic anticoagulation 06/13/2011  . Atrial fibrillation ([place]) 06/13/2011  . CAD (coronary artery disease) 06/13/2011  . Chronic diarrhea 06/13/2011    Past Surgical History:  Procedure Laterality Date  . ABDOMINAL HYSTERECTOMY  1980's  . ABDOMINAL SURGERY     for villous polyp,,,many years ago  . APPENDECTOMY  1940's  . ASCAD, s/p PTCA  11/28/2005   MID LESION   .  BREAST BIOPSY Left 1970's  . CARPAL TUNNEL RELEASE Right 12/13/2015   Procedure: CARPAL TUNNEL RELEASE;  Surgeon: Thornton Park, MD;  Location: [facility] ORS;  Service: Orthopedics;  Laterality: Right;  . COLECTOMY    . PACEMAKER PLACEMENT    . radation     for uterine cance  . REFRACTIVE SURGERY     for bilateral glaumoma  . TONSILLECTOMY  1936    Prior to Admission medications   Medication Sig Start Date End Date Taking? Authorizing Provider  CARTIA XT 120 MG 24 hr capsule TAKE ONE CAPSULE BY MOUTH EVERY MORNING 04/21/17  Yes Burnard Hawthorne, FNP  cholecalciferol (VITAMIN D) 1000 UNITS tablet Take 1,000 Units by mouth daily.   Yes [provider]  cyanocobalamin (,VITAMIN B-12,) 1000 MCG/ML injection INJECT 1 ML  INTO THE MUSCLE EVERY 30 DAYS. 04/16/17  Yes Burnard Hawthorne, FNP  metoprolol (LOPRESSOR) 50 MG tablet 25mg  in the morning and 25mg  in the evening Patient taking differently: Take 25 mg by mouth daily.  08/11/16  Yes Burnard Hawthorne, FNP  diphenoxylate-atropine (LOMOTIL) 2.5-0.025 MG tablet TAKE 1 TABLET TWO TIMES DAILY AS NEEDED FOR DIARHREA 11/25/16   Burnard Hawthorne, FNP  estradiol (ESTRACE) 0.1 MG/GM vaginal cream Place 1 Applicatorful vaginally at bedtime. Patient not taking: Reported on 04/07/2017 11/22/15   Jackolyn Confer, MD  hydrocortisone-pramoxine [facility]) 2.5-1 % rectal cream APPLY RECTALLY THREE TIMES DAILY 09/15/14   Jackolyn Confer, MD  Hypromellose (GENTEAL) 0.3 % SOLN Place 1 drop into both eyes daily as needed (dryness).     [provider]  ketoconazole (NIZORAL) 2 % cream Apply 1 application topically daily as needed.  10/09/16   [provider]  ondansetron (ZOFRAN) 4 MG tablet Take 1 tablet (4 mg total) by mouth every 8 (eight) hours as needed for nausea or vomiting. Patient not taking: Reported on 05/15/2017 12/13/15   Thornton Park, MD  triamcinolone cream (KENALOG) 0.1 % Apply 1 application topically 2 (two)  times daily. Patient taking differently: Apply 1 application topically 2 (two) times daily as needed.  09/15/14   Jackolyn Confer, MD  warfarin (COUMADIN) 1 MG tablet TAKE ONE TABLET BY MOUTH DAILY AT 6PM 04/16/17   Burnard Hawthorne, FNP    Allergies Baycol [cerivastatin sodium]; Cardizem [diltiazem hcl]; Crestor [rosuvastatin calcium]; Dronedarone; Metoprolol tartrate; Omeprazole; Pravachol; Statins; Vioxx [rofecoxib]; and Zocor [simvastatin]  Family History  Problem Relation Age of Onset  . Coronary artery disease Father   . Kidney disease Father   . Cancer Sister   . Diabetes Unknown   . Cancer Mother        COLON  . Bladder Cancer Neg Hx   . Breast cancer Neg Hx     Social History Social History   Tobacco Use  . Smoking status: Former Research scientist (life sciences)  . Smokeless tobacco: Never Used  . Tobacco comment: quit 45 years ago  Substance Use Topics  .  Alcohol use: Yes    Alcohol/week: 0.0 oz    Comment: Rarely, 1-2 times a year  . Drug use: No    Review of Systems  Constitutional: Negative for fever. Eyes: Negative for visual changes. ENT: Negative for sore throat. Neck: No neck pain  Cardiovascular: + chest tightness. Respiratory: + shortness of breath. Gastrointestinal: Negative for abdominal pain, vomiting or diarrhea. Genitourinary: Negative for dysuria. Musculoskeletal: Negative for back pain. Skin: Negative for rash. Neurological: Negative for headaches, weakness or numbness. Psych: No SI or HI  ____________________________________________   PHYSICAL EXAM:  VITAL SIGNS: Vitals:   05/15/17 1300 05/15/17 1315  BP: (!) 141/77 (!) 148/78  Pulse: 84 74  Resp: (!) 26 (!) 23  SpO2: 94% 95%   Constitutional: Alert and oriented. Well appearing and in no apparent distress. HEENT:      Head: Normocephalic and atraumatic.         Eyes: Conjunctivae are normal. Sclera is non-icteric.       Mouth/Throat: Mucous membranes are moist.       Neck: Supple with no signs  of meningismus. Cardiovascular: Regular rate and rhythm. No murmurs, gallops, or rubs. 2+ symmetrical distal pulses are present in all extremities. No JVD. Respiratory: Normal respiratory effort. Lungs are clear to auscultation bilaterally. No wheezes, crackles, or rhonchi.  Gastrointestinal: Soft, non tender, and non distended with positive bowel sounds. No rebound or guarding. Musculoskeletal: RLE 1 + pitting edema, LLE with no edema Neurologic: Normal speech and language. Face is symmetric. Moving all extremities. No gross focal neurologic deficits are appreciated. Skin: Skin is warm, dry and intact. No rash noted. Psychiatric: Mood and affect are normal. Speech and behavior are normal.  ____________________________________________   LABS (all labs ordered are listed, but only abnormal results are displayed)  Labs Reviewed  BASIC METABOLIC PANEL - Abnormal; Notable for the following components:      Result Value   Potassium 3.3 (*)    Glucose, Bld 259 (*)    Calcium 8.7 (*)    GFR calc non Af Amer 53 (*)    All other components within normal limits  APTT - Abnormal; Notable for the following components:   aPTT 46 (*)    All other components within normal limits  PROTIME-INR - Abnormal; Notable for the following components:   Prothrombin Time 34.4 (*)    All other components within normal limits  CBC  TROPONIN I  TROPONIN I   ____________________________________________  EKG  ED ECG REPORT I, Rudene Re, the attending physician, personally viewed and interpreted this ECG.  Atrial fibrillation, rate of 98, right bundle branch block, LAFB, normal QTc interval, left axis deviation, no ST elevations or depressions. ____________________________________________  [id number]  CTA chest:  1. No evidence of acute pulmonary embolism or other acute chest process. 2. Coronary and Aortic Atherosclerosis (ICD10-I70.0). Mild right chamber cardiac enlargement post  pacemaker. ____________________________________________   PROCEDURES  Procedure(s) performed: None Procedures Critical Care performed:  None ____________________________________________   INITIAL IMPRESSION / ASSESSMENT AND PLAN / ED COURSE   83 y.o. female with a history of sick sinus syndrome status post pacemaker, afib on coumadin, CAD, diabetes, HTN, HLD who presents for evaluation of SOB this am associated with chest tightness.patient is asymptomatic at this time. Since her INR has been subtherapeutic recently I sent her for CT angiogram to rule out a blood clot which was negative. Her EKG shows A. fib with no ischemic changes. Her first troponin is negative. Presentation is concerning  for angina. We'll get a second cardiac enzyme and monitor closely on telemetry. If second troponin is negative and patient remains asymptomatic we'll discuss with her cardiologist Dr. Nehemiah Massed for admission versus close outpatient follow up.      ----------------------------------------- 11:21 PM on 05/15/2017 ----------------------------------------- OBSERVATION CARE: This patient is being placed under observation care for the following reasons: Chest pain with repeat testing to rule out ischemia   _________________________ 1:38 PM on 05/15/2017 ----------------------------------------- Patient remains pain free. 2nd troponin pending. CTA negative.    _________________________ 3:38 PM on 05/15/2017 -----------------------------------------  Troponin x 2 negative. CTA negative. Discussed with Dr. Humphrey Rolls, Cardiologist on call who recommended outpatient follow up. Patient wishes to go home and does not want to be admitted. I discussed very strict return precautions with patient and recommended that she comes back to the emergency room if she has any further episode of chest pain or shortness of breath as at this time I am concerned these episodes are anginal in nature. This is patient's daughter last  night here visiting from [place] and patient wants to go home to spend the night with her daughter. Patient agrees to return to the emergency room if SOB or CP returns. In regards to her supratherapeutic INR she recently had her dose increased from 1-2 mg daily. Recommended that she hold her dose tonight and recheck INR tomorrow. If INR is between 2 and 3 recommended she restart her original dose of 1 mg daily.   As part of my medical decision making, I reviewed the following data within the [place] notes reviewed and incorporated, Labs reviewed , EKG interpreted , Old EKG reviewed, Radiograph reviewed , A consult was requested and obtained from this/these consultant(s) Cardiology, Notes from prior ED visits and [place] Controlled Substance Database    Pertinent labs & imaging results that were available during my care of the patient were reviewed by me and considered in my medical decision making (see chart for details).    ____________________________________________   FINAL CLINICAL IMPRESSION(S) / ED DIAGNOSES  Final diagnoses:  SOB (shortness of breath)  Chest pain, unspecified type  Supratherapeutic INR      NEW MEDICATIONS STARTED DURING THIS VISIT:  ED Discharge Orders    None       Note:  This document was prepared using Dragon voice recognition software and may include unintentional dictation errors.    Rudene Re, MD 05/15/17 [phone]

## 2017-05-18 ENCOUNTER — Telehealth: Payer: Self-pay | Admitting: Internal Medicine

## 2017-05-18 NOTE — Telephone Encounter (Signed)
-----   Message from Babs Bertin, [place] sent at 05/18/2017  8:57 AM EST ----- INR was 3.44 ----- Message ----- From: McLean-Scocuzza, Nino Glow, MD Sent: 05/15/2017   6:55 PM To: Babs Bertin, CMA  Please see my note from 12/28 and call pt Monday to f/u INR and instruct in my note if <2.0   Get back with me please on what INR reading was?  Thanks [employer]

## 2017-05-21 LAB — POCT INR: INR: 2.2 — AB (ref <=1.1)

## 2017-05-22 DIAGNOSIS — I480 Paroxysmal atrial fibrillation: Secondary | ICD-10-CM | POA: Diagnosis not present

## 2017-05-22 DIAGNOSIS — I10 Essential (primary) hypertension: Secondary | ICD-10-CM | POA: Diagnosis not present

## 2017-05-22 DIAGNOSIS — R0602 Shortness of breath: Secondary | ICD-10-CM | POA: Diagnosis not present

## 2017-05-22 DIAGNOSIS — I2510 Atherosclerotic heart disease of native coronary artery without angina pectoris: Secondary | ICD-10-CM | POA: Diagnosis not present

## 2017-05-22 NOTE — Progress Notes (Signed)
Patient is taking 1mg  and 2mg  on sunday.

## 2017-06-01 ENCOUNTER — Telehealth: Payer: Self-pay

## 2017-06-01 NOTE — Telephone Encounter (Signed)
Informed by [facility] that she has been informed of her PTINR results.

## 2017-06-01 NOTE — Telephone Encounter (Signed)
Copied from [place] [phone]. Topic: General - Call Back - No Documentation >> Jun 01, 2017 12:56 PM Ahmed Prima L wrote: Patient said she missed a call from the office.

## 2017-06-05 ENCOUNTER — Telehealth: Payer: Self-pay

## 2017-06-05 ENCOUNTER — Encounter: Payer: Self-pay | Admitting: Family

## 2017-06-05 ENCOUNTER — Telehealth: Payer: Self-pay | Admitting: Family

## 2017-06-05 LAB — POCT INR: INR: 2.4

## 2017-06-05 NOTE — Telephone Encounter (Signed)
Pt states that Dr Derrel Nip was going to take her on as a pt. Please advise?  Call pt @ [phone]. Thank you!

## 2017-06-05 NOTE — Telephone Encounter (Signed)
Dr. Derrel Nip did agree to accept pt. She told the pt that at her last appt with Dr. Derrel Nip.

## 2017-06-05 NOTE — Telephone Encounter (Signed)
LMTCB. Need to let pt know that her PT/INR result was therapeutic and she needs to continue her current coumadin dose. Recheck in one month. [facility] may speak with pt.

## 2017-06-08 NOTE — Telephone Encounter (Signed)
Ok. Thank you! I know that's what she said. We still have to verify. :)

## 2017-06-12 DIAGNOSIS — Z7901 Long term (current) use of anticoagulants: Secondary | ICD-10-CM | POA: Diagnosis not present

## 2017-06-12 LAB — POCT INR: INR: 2.2 — AB (ref <=1.1)

## 2017-06-15 ENCOUNTER — Ambulatory Visit (INDEPENDENT_AMBULATORY_CARE_PROVIDER_SITE_OTHER): Payer: Medicare Other | Admitting: Internal Medicine

## 2017-06-15 ENCOUNTER — Encounter: Payer: Self-pay | Admitting: Internal Medicine

## 2017-06-15 VITALS — BP 120/68 | HR 83 | Temp 98.4°F | Resp 14 | Ht 62.0 in | Wt 133.2 lb

## 2017-06-15 DIAGNOSIS — I4891 Unspecified atrial fibrillation: Secondary | ICD-10-CM | POA: Diagnosis not present

## 2017-06-15 DIAGNOSIS — E119 Type 2 diabetes mellitus without complications: Secondary | ICD-10-CM | POA: Diagnosis not present

## 2017-06-15 DIAGNOSIS — R0609 Other forms of dyspnea: Secondary | ICD-10-CM | POA: Diagnosis not present

## 2017-06-15 DIAGNOSIS — R0600 Dyspnea, unspecified: Secondary | ICD-10-CM

## 2017-06-15 DIAGNOSIS — E1140 Type 2 diabetes mellitus with diabetic neuropathy, unspecified: Secondary | ICD-10-CM | POA: Diagnosis not present

## 2017-06-15 MED ORDER — WARFARIN SODIUM 1 MG PO TABS: ORAL_TABLET | ORAL | 1 refills | Status: DC

## 2017-06-15 MED ORDER — DILTIAZEM HCL ER COATED BEADS 180 MG PO CP24: 180.0000 mg | ORAL_CAPSULE | Freq: Every morning | ORAL | 0 refills | Status: DC

## 2017-06-15 NOTE — Patient Instructions (Addendum)
You have no signs of heart failure,  Blood clot on the lungs, or pneumonia as a cause for your shortness of breath  Your fatigue and shortness of breath are caused by your heart rate speeding up too much when you walk  I have increased the dose of diltiazem to 180 mg daily (your previous dose was 120 mg )  To help lower your heart rate.  Your pacemaker will keep your rate from getting too low.   Follow p with Dr Melvern Sample at the end of February and let him know about this change

## 2017-06-15 NOTE — Progress Notes (Signed)
Subjective:  Patient ID: [name], female    DOB: 10-Apr-1923  Age: 84 y.o. MRN: [id number]  CC: The primary encounter diagnosis was Controlled type 2 diabetes mellitus without complication, without long-term current use of insulin ([place]). Diagnoses of Atrial fibrillation, unspecified type ([place]), Exertional dyspnea, and Type 2 diabetes mellitus with diabetic neuropathy, without long-term current use of insulin ([place]) were also pertinent to this visit.  HPI JESLYN AMSLER presents for follow up on multiple issues  Including new onset type 2 Dm evidence by recent A1c  Lab Results  Component Value Date   HGBA1C 6.7 (H) 04/07/2017    Cc: dyspnea:  She saw her cardiologist on Jan 9 for ER follow up on Dec 28  for  Subacute onset of dyspnea and orthopnea accompanied by worsening fatigue . troponins x 2 negative ,.no EKG changes.  CT angiography negative for PE,  Effusion and pneumonia.  Discharged home with  no changes in medications.   No medication changes were made during cardiology visit but 4 week follow up was advised .  she continues to note decreased ambulation distance due to dyspnea and fatigue.   Does not use a walker,  Denies chest pain, leg pain and back pain .  No orthopnea, or weight changes.  Appetite good.   Has not had ambulatory sats or pulse checked prior to today. Her ambulatory sats today in office improved from 94 to 97%  But her  pulse jumps from 80 to 115   With a very short walk and she becomes symptomatic.     Outpatient Medications Prior to Visit  Medication Sig Dispense Refill  . cholecalciferol (VITAMIN D) 1000 UNITS tablet Take 1,000 Units by mouth daily.    . cyanocobalamin (,VITAMIN B-12,) 1000 MCG/ML injection INJECT 1 ML  INTO THE MUSCLE EVERY 30 DAYS. 1 mL 6  . diphenoxylate-atropine (LOMOTIL) 2.5-0.025 MG tablet TAKE 1 TABLET TWO TIMES DAILY AS NEEDED FOR DIARHREA 60 tablet 1  . estradiol (ESTRACE) 0.1 MG/GM vaginal cream Place 1 Applicatorful vaginally at  bedtime. 42.5 g 0  . Hypromellose (GENTEAL) 0.3 % SOLN Place 1 drop into both eyes daily as needed (dryness).     Marland Kitchen ketoconazole (NIZORAL) 2 % cream Apply 1 application topically daily as needed.   1  . metoprolol (LOPRESSOR) 50 MG tablet 25mg  in the morning and 25mg  in the evening (Patient taking differently: Take 25 mg by mouth daily. ) 60 tablet 4  . ondansetron (ZOFRAN) 4 MG tablet Take 1 tablet (4 mg total) by mouth every 8 (eight) hours as needed for nausea or vomiting. 30 tablet 0  . triamcinolone cream (KENALOG) 0.1 % Apply 1 application topically 2 (two) times daily. (Patient taking differently: Apply 1 application topically 2 (two) times daily as needed. ) 30 g 0  . CARTIA XT 120 MG 24 hr capsule TAKE ONE CAPSULE BY MOUTH EVERY MORNING 90 capsule 0  . warfarin (COUMADIN) 1 MG tablet TAKE ONE TABLET BY MOUTH DAILY AT 6PM 45 tablet 1  . hydrocortisone-pramoxine (ANALPRAM-HC) 2.5-1 % rectal cream APPLY RECTALLY THREE TIMES DAILY 30 g 0   No facility-administered medications prior to visit.     Review of Systems;  Patient denies headache, fevers, malaise, unintentional weight loss, skin rash, eye pain, sinus congestion and sinus pain, sore throat, dysphagia,  hemoptysis , cough, wheezing, chest pain, palpitations, orthopnea, edema, abdominal pain, nausea, melena, diarrhea, constipation, flank pain, dysuria, hematuria, urinary  Frequency, nocturia, numbness, tingling, seizures,  Focal weakness, Loss of consciousness,  Tremor, insomnia, depression, anxiety, and suicidal ideation.      Objective:  BP 120/68 (BP Location: Left Arm, Patient Position: Sitting, Cuff Size: Normal)   Pulse 83   Temp 98.4 F (36.9 C) (Oral)   Resp 14   Ht 5\' 2"  (1.575 m)   Wt 133 lb 3.2 oz (60.4 kg)   SpO2 96%   BMI 24.36 kg/m   BP Readings from Last 3 Encounters:  06/15/17 120/68  05/15/17 (!) 152/76  04/07/17 126/70    Wt Readings from Last 3 Encounters:  06/15/17 133 lb 3.2 oz (60.4 kg)    05/15/17 135 lb (61.2 kg)  04/07/17 135 lb 3.2 oz (61.3 kg)    General appearance: alert, cooperative and appears stated age Ears: normal TM's and external ear canals both ears Throat: lips, mucosa, and tongue normal; teeth and gums normal Neck: no adenopathy, no carotid bruit, supple, symmetrical, trachea midline and thyroid not enlarged, symmetric, no tenderness/mass/nodules Back: symmetric, no curvature. ROM normal. No CVA tenderness. Lungs: clear to auscultation bilaterally Heart: regular rate and rhythm, S1, S2 normal, no murmur, click, rub or gallop Abdomen: soft, non-tender; bowel sounds normal; no masses,  no organomegaly Pulses: 2+ and symmetric Skin: Skin color, texture, turgor normal. No rashes or lesions Lymph nodes: Cervical, supraclavicular, and axillary nodes normal.  Lab Results  Component Value Date   HGBA1C 6.7 (H) 04/07/2017   HGBA1C 6.4 06/17/2016   HGBA1C 6.4 05/29/2015    Lab Results  Component Value Date   CREATININE 0.91 05/15/2017   CREATININE 0.87 04/20/2017   CREATININE 0.78 12/15/2016    Lab Results  Component Value Date   WBC 10.6 05/15/2017   HGB 14.4 05/15/2017   HCT 43.0 05/15/2017   PLT 212 05/15/2017   GLUCOSE 259 (H) 05/15/2017   CHOL 142 10/11/2013   TRIG 194.0 (H) 10/11/2013   HDL 43.10 10/11/2013   LDLDIRECT 86.3 06/30/2012   LDLCALC 60 10/11/2013   ALT 14 09/15/2016   AST 18 09/15/2016   NA 138 05/15/2017   K 3.3 (L) 05/15/2017   CL 102 05/15/2017   CREATININE 0.91 05/15/2017   BUN 17 05/15/2017   CO2 29 05/15/2017   TSH 3.35 04/07/2017   INR 2.2 (A) 05/21/2017   HGBA1C 6.7 (H) 04/07/2017   MICROALBUR 1.0 04/20/2017    Dg Chest 2 View  Result Date: 05/15/2017 CLINICAL DATA:  Shortness of Breath EXAM: CHEST  2 VIEW COMPARISON:  May 22, 2014 FINDINGS: There is no edema or consolidation. Heart size and pulmonary vascularity are normal. Pacemaker leads are attached to the right atrium right ventricle. There is aortic  atherosclerosis. No adenopathy. There is evidence of an old healed fracture of the proximal left humerus. IMPRESSION: No edema or consolidation. Aortic atherosclerosis. Old fracture proximal left humerus. Aortic Atherosclerosis (ICD10-I70.0). Electronically Signed   By: Lowella Grip III M.D.   On: 05/15/2017 10:58   Ct Angio Chest Pe W And/or Wo Contrast  Result Date: 05/15/2017 CLINICAL DATA:  Acute onset of severe shortness of breath this morning. Worsening shortness of breath when lying flat. History of uterine and skin cancer and mitral valve regurgitation. Question pulmonary embolism. EXAM: CT ANGIOGRAPHY CHEST WITH CONTRAST TECHNIQUE: Multidetector CT imaging of the chest was performed using the standard protocol during bolus administration of intravenous contrast. Multiplanar CT image reconstructions and MIPs were obtained to evaluate the vascular anatomy. CONTRAST:  104mL ISOVUE-370 IOPAMIDOL (ISOVUE-370) INJECTION 76% COMPARISON:  Radiographs 05/15/2017.  FINDINGS: Cardiovascular: The pulmonary arteries are well opacified with contrast to the level of the subsegmental branches. There is no evidence of acute pulmonary embolism. There is limited opacification of the systemic vessels. Mild atherosclerosis of the aorta, great vessels and coronary arteries. Left subclavian pacemaker leads appear unchanged in the right atrium and right ventricle. Mild right chamber cardiac enlargement, likely chronic. No pericardial effusion. Mediastinum/Nodes: There are no enlarged mediastinal, hilar or axillary lymph nodes.There are small mediastinal lymph nodes. The thyroid gland, trachea and esophagus demonstrate no significant findings. Lungs/Pleura: No pleural effusion or pneumothorax. The lungs are clear. Upper abdomen: The visualized upper abdomen appears unremarkable aside from vascular calcifications. Musculoskeletal/Chest wall: There is no chest wall mass or suspicious osseous finding. Mild degenerative changes  in the spine. Review of the MIP images confirms the above findings. IMPRESSION: 1. No evidence of acute pulmonary embolism or other acute chest process. 2. Coronary and Aortic Atherosclerosis (ICD10-I70.0). Mild right chamber cardiac enlargement post pacemaker. Electronically Signed   By: Richardean Sale M.D.   On: 05/15/2017 12:07    Assessment & Plan:   Problem List Items Addressed This Visit    Atrial fibrillation (HCC) (Chronic)   Relevant Medications   warfarin (COUMADIN) 1 MG tablet   diltiazem (CARDIZEM CD) 180 MG 24 hr capsule   Controlled type 2 diabetes mellitus without complication, without long-term current use of insulin (HCC) - Primary   Relevant Orders   Hemoglobin A1c   Comprehensive metabolic panel   Exertional dyspnea    Given her history of SSS s/p pacer, and atrial fibrillation,  I have increased her cardizem dose to 180 mg based on ambulatory tachycardia noted today in office.  Follow up with Nehemiah Massed as requested,  For ECHO/myoview if persistent       Type 2 diabetes mellitus with diabetic neuropathy, unspecified ([place])    Encouraged to avoid eating ice cream nightly.  Given her age,  No treatment unless a1c is > 7.0 during  RTC in may .  Lab Results  Component Value Date   HGBA1C 6.7 (H) 04/07/2017   Lab Results  Component Value Date   MICROALBUR 1.0 04/20/2017           A total of 25 minutes of face to face time was spent with patient more than half of which was spent in counselling about the above mentioned conditions  and coordination of care  I have changed Daleen Squibb. [place]'s CARTIA XT to diltiazem. I am also having her maintain her cholecalciferol, Hypromellose, hydrocortisone-pramoxine, triamcinolone cream, estradiol, ondansetron, metoprolol tartrate, ketoconazole, diphenoxylate-atropine, cyanocobalamin, and warfarin.  Meds ordered this encounter  Medications  . warfarin (COUMADIN) 1 MG tablet    Sig: TAKE ONE TABLET BY MOUTH DAILY AT 6PM     Dispense:  45 tablet    Refill:  1  . diltiazem (CARDIZEM CD) 180 MG 24 hr capsule    Sig: Take 1 capsule (180 mg total) by mouth every morning.    Dispense:  30 capsule    Refill:  0    Medications Discontinued During This Encounter  Medication Reason  . warfarin (COUMADIN) 1 MG tablet Reorder  . CARTIA XT 120 MG 24 hr capsule     Follow-up: Return in about 3 months (around 09/16/2017) for follow up diabetes.   Crecencio Mc, MD

## 2017-06-16 ENCOUNTER — Telehealth: Payer: Self-pay

## 2017-06-16 DIAGNOSIS — R0609 Other forms of dyspnea: Secondary | ICD-10-CM | POA: Insufficient documentation

## 2017-06-16 DIAGNOSIS — R0600 Dyspnea, unspecified: Secondary | ICD-10-CM | POA: Insufficient documentation

## 2017-06-16 NOTE — Assessment & Plan Note (Signed)
Given her history of SSS s/p pacer, and atrial fibrillation,  I have increased her cardizem dose to 180 mg based on ambulatory tachycardia noted today in office.  Follow up with Nehemiah Massed as requested,  For ECHO/myoview if persistent

## 2017-06-16 NOTE — Assessment & Plan Note (Addendum)
Encouraged to avoid eating ice cream nightly.  Given her age,  No treatment unless a1c is > 7.0 during  RTC in may .  Lab Results  Component Value Date   HGBA1C 6.7 (H) 04/07/2017   Lab Results  Component Value Date   MICROALBUR 1.0 04/20/2017

## 2017-06-16 NOTE — Telephone Encounter (Signed)
Copied from [place] [phone]. Topic: General - Other >> Jun 16, 2017 11:47 AM Carolyn Stare wrote:   Pt call to say she like to keep her pills ahead and is asking if she can have the below RX wrote for more pills   would like a call back    [phone]  warfarin (COUMADIN) 1 MG tablet

## 2017-06-17 ENCOUNTER — Telehealth: Payer: Self-pay | Admitting: Internal Medicine

## 2017-06-17 NOTE — Telephone Encounter (Signed)
MD- INR received fax INR= 2.2, per chart patient on 1 mg coumadin daily except  Sunday take 2 mg.

## 2017-06-17 NOTE — Telephone Encounter (Signed)
Kara [name] stated that pt is going to pick up a small supply from pharmacy to get her through the month until insurance will pay for her refill.

## 2017-06-17 NOTE — Telephone Encounter (Signed)
  Your INR/coumadin level is therapeutic,  Continue current regimen and repeat PT/INR in one week   

## 2017-06-17 NOTE — Telephone Encounter (Signed)
Patient notified and voiced understanding to results.

## 2017-06-19 ENCOUNTER — Encounter: Payer: Self-pay | Admitting: Internal Medicine

## 2017-06-19 LAB — PROTIME-INR

## 2017-06-22 ENCOUNTER — Telehealth: Payer: Self-pay | Admitting: Internal Medicine

## 2017-06-22 NOTE — Telephone Encounter (Signed)
Kara [name] called for clarification of coumadin orders; she states that the pt says that Dr Derrel Nip tells her to take doses of coumadin on some days; therefore her prescription is running out early; please clarify dose and send new prescription if changes from the original; also please contact the pt with correct drug dosage.

## 2017-06-23 NOTE — Telephone Encounter (Signed)
LMTCB with pt. Please transfer her to our office.   Spoke with the pharmacist at [facility] and informed him of the pt's new dose of coumadin and how the pt is to be taking the medication. Also informed him that the pt is supposed to be checking her INR again on Thursday so we don't want to change anything until we get the results of the INR. Pharmacist gave a verbal understanding and also stated that the pt has been getting confused very easily.

## 2017-06-24 ENCOUNTER — Telehealth: Payer: Self-pay | Admitting: Internal Medicine

## 2017-06-24 LAB — PROTIME-INR

## 2017-06-24 NOTE — Telephone Encounter (Signed)
Pt has questions re: coumadin and lopressor, states "clarification."  Requesting to speak with Kara [name]. Explained [facility] Triage and I was an Therapist, sports and happy to help her. Pt states Kara [name] is supposed to call her tomorrow with lab results and it can wait. Again reviewed triage process. Pt states it does not affect her medication today, she will wait until tomorrow and discuss with Kara [name]. # [phone]

## 2017-06-24 NOTE — Telephone Encounter (Signed)
Spoke with pt and she stated that she has been taking 1mg  everyday. Pt stated that she is going to check her INR today. Explained to pt to keep taking the dose she is taking until we get the results of her INR and then we will call her and let her know what she needs to start taking. Pt gave verbal understanding.

## 2017-06-24 NOTE — Telephone Encounter (Signed)
Copied from [place] [id number]. Topic: Inquiry >> Jun 24, 2017  3:25 PM Boyd Kerbs wrote: Reason for CRM:   Wants Jessica to call her about coumadin and metoprolol (LOPRESSOR) 50 MG tablet. Has questions

## 2017-06-25 ENCOUNTER — Telehealth: Payer: Self-pay | Admitting: Internal Medicine

## 2017-06-25 DIAGNOSIS — E782 Mixed hyperlipidemia: Secondary | ICD-10-CM | POA: Diagnosis not present

## 2017-06-25 DIAGNOSIS — I10 Essential (primary) hypertension: Secondary | ICD-10-CM | POA: Diagnosis not present

## 2017-06-25 DIAGNOSIS — R601 Generalized edema: Secondary | ICD-10-CM | POA: Diagnosis not present

## 2017-06-25 DIAGNOSIS — I480 Paroxysmal atrial fibrillation: Secondary | ICD-10-CM | POA: Diagnosis not present

## 2017-06-25 DIAGNOSIS — R0602 Shortness of breath: Secondary | ICD-10-CM | POA: Diagnosis not present

## 2017-06-25 DIAGNOSIS — I340 Nonrheumatic mitral (valve) insufficiency: Secondary | ICD-10-CM | POA: Diagnosis not present

## 2017-06-25 DIAGNOSIS — I2510 Atherosclerotic heart disease of native coronary artery without angina pectoris: Secondary | ICD-10-CM | POA: Diagnosis not present

## 2017-06-25 DIAGNOSIS — I495 Sick sinus syndrome: Secondary | ICD-10-CM | POA: Diagnosis not present

## 2017-06-25 DIAGNOSIS — I071 Rheumatic tricuspid insufficiency: Secondary | ICD-10-CM | POA: Diagnosis not present

## 2017-06-25 NOTE — Telephone Encounter (Signed)
Coumadin level is therapeutic,  Continue current regimen of 1 mg daily and repeat PT/INR in one week.

## 2017-06-26 NOTE — Telephone Encounter (Signed)
LMTCB. Please transfer pt to our office.  

## 2017-06-26 NOTE — Telephone Encounter (Signed)
Talked with patient medication questions answered by pharmacy.

## 2017-06-26 NOTE — Telephone Encounter (Signed)
Patient stated she has already straightened out her questions with the pharmacy.

## 2017-07-01 ENCOUNTER — Telehealth: Payer: Self-pay | Admitting: Internal Medicine

## 2017-07-01 ENCOUNTER — Encounter: Payer: Self-pay | Admitting: Family

## 2017-07-01 LAB — PROTIME-INR

## 2017-07-01 NOTE — Telephone Encounter (Signed)
INF is slightly low at 1.9  Increase dose of coumadin to 2 mg on Thursdays and Sundays ONLY .  Continue 1 mg all other days.  Repeat INR in one week

## 2017-07-01 NOTE — Telephone Encounter (Signed)
TC from [place] with [place]. Reported patient's INR today is 1.9

## 2017-07-02 NOTE — Telephone Encounter (Signed)
LMTCB. Please transfer pt to our office.  

## 2017-07-02 NOTE — Telephone Encounter (Signed)
See previous telephone note. 

## 2017-07-03 NOTE — Telephone Encounter (Signed)
Spoke with pt and informed her of her INR results. The pt gave a verbal understanding and repeated medication directions back with understanding.

## 2017-07-10 ENCOUNTER — Telehealth: Payer: Self-pay | Admitting: Internal Medicine

## 2017-07-10 ENCOUNTER — Encounter: Payer: Self-pay | Admitting: Family

## 2017-07-10 DIAGNOSIS — Z7901 Long term (current) use of anticoagulants: Secondary | ICD-10-CM | POA: Diagnosis not present

## 2017-07-10 LAB — PROTIME-INR

## 2017-07-10 NOTE — Telephone Encounter (Signed)
LMTCB. Please transfer pt to our office.  

## 2017-07-10 NOTE — Telephone Encounter (Signed)
Coumadin level is therapeutic,  Continue current regimen and repeat PT/INR in one week 

## 2017-07-14 ENCOUNTER — Ambulatory Visit: Payer: Medicare Other | Admitting: Family

## 2017-07-15 NOTE — Telephone Encounter (Signed)
Patient notified and voiced understanding. Patient says she still feels tired  With new dose of Cardizem she has  been on now for 30 days

## 2017-07-17 ENCOUNTER — Encounter: Payer: Self-pay | Admitting: Family

## 2017-07-17 ENCOUNTER — Ambulatory Visit: Payer: Medicare Other | Admitting: Family

## 2017-07-17 LAB — PROTIME-INR: INR: 2.8 — AB (ref 0.9–1.1)

## 2017-07-18 ENCOUNTER — Other Ambulatory Visit: Payer: Self-pay | Admitting: Family

## 2017-07-18 DIAGNOSIS — K529 Noninfective gastroenteritis and colitis, unspecified: Secondary | ICD-10-CM

## 2017-07-20 ENCOUNTER — Telehealth: Payer: Self-pay | Admitting: Internal Medicine

## 2017-07-20 ENCOUNTER — Other Ambulatory Visit: Payer: Self-pay

## 2017-07-20 DIAGNOSIS — I4891 Unspecified atrial fibrillation: Secondary | ICD-10-CM

## 2017-07-20 MED ORDER — WARFARIN SODIUM 1 MG PO TABS: ORAL_TABLET | ORAL | 0 refills | Status: DC

## 2017-07-20 MED ORDER — DILTIAZEM HCL ER COATED BEADS 180 MG PO CP24: 180.0000 mg | ORAL_CAPSULE | Freq: Every morning | ORAL | 0 refills | Status: DC

## 2017-07-20 MED ORDER — CYANOCOBALAMIN 1000 MCG/ML IJ SOLN: INTRAMUSCULAR | 2 refills | Status: DC

## 2017-07-20 NOTE — Telephone Encounter (Signed)
07/23/17 at 930

## 2017-07-20 NOTE — Telephone Encounter (Signed)
Spoke with pt and informed her of her INR result and that she needs to continue taking her current coumadin dose. Pt gave a verbal understanding.

## 2017-07-20 NOTE — Telephone Encounter (Signed)
Yes she should continue monthly b12 injections.      rx sent

## 2017-07-20 NOTE — Telephone Encounter (Signed)
Please schedule her  a nurse visit for vital signs check

## 2017-07-20 NOTE — Telephone Encounter (Signed)
Pt requested a refill on B12 injections. Looking at last lab result from 2018 it was stated that the pt was to just continue b12 injections for 3 weeks. Does the pt need to continue taking the b12 injections?

## 2017-07-20 NOTE — Telephone Encounter (Signed)
Coumadin level is therapeutic,  Continue current regimen and repeat PT/INR in one week 

## 2017-07-23 ENCOUNTER — Ambulatory Visit (INDEPENDENT_AMBULATORY_CARE_PROVIDER_SITE_OTHER): Payer: Medicare Other

## 2017-07-23 VITALS — BP 122/66 | HR 85

## 2017-07-23 DIAGNOSIS — I10 Essential (primary) hypertension: Secondary | ICD-10-CM | POA: Diagnosis not present

## 2017-07-23 NOTE — Progress Notes (Addendum)
Patient presented today for a blood pressure check. Patient's Cardizem had been increased to 180 mg daily and the patient had originally stated that she was still feeling tired and sluggish. The patient stated that she ran out of the medication for a few days and went back to the lower dose. We called in the Cardizem 180 mg so she could start it back and the patient stated today that since starting back on the higher dose she has started feeling better. She stated that she still gets tired when walking a long distance but other than that she is feeling better. Patients blood pressure today in the left arm was 128/60 with pulse of 85 and 122/66 in the right arm with a pulse of 85. Patient was advised to continue her current medications and if something needed to be changed we would contact her. Patient gave a verbal understanding to all instructions.    I have reviewed the above information and agree with above.   Deborra Medina, MD

## 2017-07-27 ENCOUNTER — Telehealth: Payer: Self-pay | Admitting: Internal Medicine

## 2017-07-27 NOTE — Telephone Encounter (Signed)
inr slightly high .  Is she using a pill box to make sure she is taking her medication correctly?  What dose of coumadin is she using?

## 2017-07-28 NOTE — Telephone Encounter (Signed)
Have her reduce dose to 1 mg  mon tues wed,  Friday and sundays,   Skip dose on Thursday and saturdays.  Repeat next Monday

## 2017-07-28 NOTE — Telephone Encounter (Signed)
Spoke with pt and informed her of her new coumadin dose. Pt gave a verbal understanding and stated that she wrote it down as well. Pt also stated that she would check it again on Monday.

## 2017-07-28 NOTE — Telephone Encounter (Signed)
Spoke with pt and she stated that she is using the pill box. She stated that she is using 1mg  daily but that she skipped Saturday night because she thought her result ws high. Pt was advised not to change her dosing pattern unless advised by PCP because it can alter her result. Pt gave a verbal understanding.

## 2017-08-03 ENCOUNTER — Encounter: Payer: Self-pay | Admitting: Family

## 2017-08-03 LAB — PROTIME-INR: INR: 1.3 — AB (ref 0.9–1.1)

## 2017-08-04 ENCOUNTER — Telehealth: Payer: Self-pay | Admitting: Internal Medicine

## 2017-08-04 ENCOUNTER — Telehealth: Payer: Self-pay

## 2017-08-04 NOTE — Telephone Encounter (Signed)
Patient taking  1Mg  coumadin on Monday, Tuesday, Wednesday, Friday, and Sunday, No coumadin on Thursday or Saturday.

## 2017-08-04 NOTE — Telephone Encounter (Signed)
Please have patient resume 1 mg every day, 7 days per week  Repeat inr 1 week s

## 2017-08-04 NOTE — Telephone Encounter (Signed)
This may be a duplicate.  You have lab on this pt.

## 2017-08-04 NOTE — Telephone Encounter (Signed)
Result sent by phone encounter and give to Dr. Nicki Reaper.PF

## 2017-08-04 NOTE — Telephone Encounter (Signed)
Spoke with pt and informed her of the medication change. Pt repeated all instructions back with understanding.

## 2017-08-04 NOTE — Telephone Encounter (Signed)
[place] from [facility] called with a lab of an INR  1.3 done yesterday.   Provider: Dr. Salomon [facility] notified ([place]).

## 2017-08-04 NOTE — Telephone Encounter (Signed)
Critical labs  INR: 1.3

## 2017-08-11 ENCOUNTER — Telehealth: Payer: Self-pay | Admitting: Internal Medicine

## 2017-08-11 ENCOUNTER — Encounter: Payer: Self-pay | Admitting: Family

## 2017-08-11 ENCOUNTER — Other Ambulatory Visit: Payer: Self-pay | Admitting: Internal Medicine

## 2017-08-11 DIAGNOSIS — I4891 Unspecified atrial fibrillation: Secondary | ICD-10-CM

## 2017-08-11 DIAGNOSIS — Z7901 Long term (current) use of anticoagulants: Secondary | ICD-10-CM | POA: Diagnosis not present

## 2017-08-11 LAB — PROTIME-INR

## 2017-08-11 NOTE — Telephone Encounter (Signed)
Please advise 

## 2017-08-11 NOTE — Telephone Encounter (Signed)
Continue current dose.

## 2017-08-11 NOTE — Telephone Encounter (Signed)
Copied from [place] [id number]. Topic: General - Other >> Aug 11, 2017 12:46 PM Margot Ables wrote: Reason for CRM: reporting out of range INR  Today 1.9 Please contact pt to make any adjustments

## 2017-08-12 NOTE — Telephone Encounter (Signed)
Spoke with pt and informed her of her INR results. Pt gave a verbal understanding.  

## 2017-08-18 LAB — PROTIME-INR

## 2017-08-19 ENCOUNTER — Telehealth: Payer: Self-pay

## 2017-08-19 NOTE — Telephone Encounter (Signed)
Coumadin level is therapeutic,  Continue current regimen and repeat PT/INR in one week 

## 2017-08-19 NOTE — Telephone Encounter (Signed)
Spoke with pt and informed her of her INR results and that she needs to continue her current coumadin regimen. The pt gave a verbal understanding.

## 2017-08-19 NOTE — Telephone Encounter (Signed)
INR results: 2.5.    Result has been placed in yellow folder.

## 2017-08-26 ENCOUNTER — Encounter: Payer: Self-pay | Admitting: Family

## 2017-08-26 LAB — PROTIME-INR: INR: 2.4 — AB (ref 0.9–1.1)

## 2017-08-28 ENCOUNTER — Telehealth: Payer: Self-pay

## 2017-08-28 NOTE — Telephone Encounter (Signed)
Coumadin level is therapeutic,  Continue current regimen and repeat PT/INR in one week 

## 2017-08-28 NOTE — Telephone Encounter (Signed)
Patient new INR is 2.4 coumadin dosage is 1mg  daily.

## 2017-08-31 NOTE — Telephone Encounter (Signed)
Patient notified that coumadin level is theraputic and to continue current regiment and repeat PT/INR in one week. Patient verbalized understanding of this.

## 2017-09-02 ENCOUNTER — Telehealth: Payer: Self-pay

## 2017-09-02 NOTE — Telephone Encounter (Signed)
New INR result is 2.5. Checked on 09/01/2017. Pt is taking 1mg  daily.

## 2017-09-02 NOTE — Telephone Encounter (Signed)
CONTINUE CURRENT REGIMEN

## 2017-09-02 NOTE — Telephone Encounter (Signed)
Spoke with pt and informed her of her INR results and that she needs to continue her current regimen. Pt gave a verbal understanding.  

## 2017-09-09 ENCOUNTER — Encounter: Payer: Self-pay | Admitting: Family

## 2017-09-09 DIAGNOSIS — Z7901 Long term (current) use of anticoagulants: Secondary | ICD-10-CM | POA: Diagnosis not present

## 2017-09-09 LAB — PROTIME-INR: INR: 2.6 — AB (ref 0.9–1.1)

## 2017-09-10 ENCOUNTER — Telehealth: Payer: Self-pay

## 2017-09-10 NOTE — Telephone Encounter (Signed)
Spoke with pt and informed her of her lab results and that she needs to continue her current coumadin dose. Pt gave a verbal understanding.

## 2017-09-10 NOTE — Telephone Encounter (Signed)
Received INR result of 2.6. Pt is currently taking 1mg  daily. Pt checks INR once a week.

## 2017-09-10 NOTE — Telephone Encounter (Signed)
Continue same coumadin dose and recheck as pt is scheduled to check.  (stated checks q week).

## 2017-09-17 ENCOUNTER — Telehealth: Payer: Self-pay

## 2017-09-17 DIAGNOSIS — Z7901 Long term (current) use of anticoagulants: Secondary | ICD-10-CM

## 2017-09-17 NOTE — Telephone Encounter (Signed)
Spoke with pt and informed her of her INR results and that she needs to continue taking her current coumadin dose and recheck INR in one week. Pt gave a verbal understanding.

## 2017-09-17 NOTE — Telephone Encounter (Signed)
Pt's INR on 09/16/2017 is 2.1. Pt is currently taking 1mg  daily.

## 2017-09-17 NOTE — Telephone Encounter (Signed)
Coumadin level is therapeutic,  Continue current regimen and repeat PT/INR in one week 

## 2017-09-18 ENCOUNTER — Other Ambulatory Visit: Payer: Self-pay | Admitting: Family

## 2017-09-18 ENCOUNTER — Other Ambulatory Visit: Payer: Self-pay | Admitting: Internal Medicine

## 2017-09-18 DIAGNOSIS — K529 Noninfective gastroenteritis and colitis, unspecified: Secondary | ICD-10-CM

## 2017-09-18 NOTE — Telephone Encounter (Signed)
Last OV 06/15/17 ok to fill?

## 2017-09-23 DIAGNOSIS — E119 Type 2 diabetes mellitus without complications: Secondary | ICD-10-CM | POA: Diagnosis not present

## 2017-09-23 LAB — HM DIABETES EYE EXAM

## 2017-09-25 ENCOUNTER — Telehealth: Payer: Self-pay | Admitting: Internal Medicine

## 2017-09-25 NOTE — Telephone Encounter (Signed)
Left voicemail with PT/NR per Dr. Derrel Nip and for patient to call the office if she has any questions or concerns. [place] for [facility] to give patient information if she call back.

## 2017-09-25 NOTE — Telephone Encounter (Signed)
Coumadin level is therapeutic,  Continue current regimen and repeat PT/INR in one week 

## 2017-10-01 ENCOUNTER — Encounter: Payer: Self-pay | Admitting: Internal Medicine

## 2017-10-01 LAB — PROTIME-INR

## 2017-10-05 ENCOUNTER — Telehealth: Payer: Self-pay | Admitting: Internal Medicine

## 2017-10-05 DIAGNOSIS — I071 Rheumatic tricuspid insufficiency: Secondary | ICD-10-CM | POA: Diagnosis not present

## 2017-10-05 DIAGNOSIS — I340 Nonrheumatic mitral (valve) insufficiency: Secondary | ICD-10-CM | POA: Diagnosis not present

## 2017-10-05 DIAGNOSIS — I495 Sick sinus syndrome: Secondary | ICD-10-CM | POA: Diagnosis not present

## 2017-10-05 DIAGNOSIS — I2510 Atherosclerotic heart disease of native coronary artery without angina pectoris: Secondary | ICD-10-CM | POA: Diagnosis not present

## 2017-10-05 NOTE — Telephone Encounter (Signed)
Coumadin level is therapeutic,  Continue current regimen and repeat PT/INR in one week 

## 2017-10-06 ENCOUNTER — Ambulatory Visit (INDEPENDENT_AMBULATORY_CARE_PROVIDER_SITE_OTHER): Payer: Medicare Other | Admitting: Internal Medicine

## 2017-10-06 ENCOUNTER — Encounter: Payer: Self-pay | Admitting: Internal Medicine

## 2017-10-06 VITALS — BP 128/64 | HR 87 | Temp 98.1°F | Resp 14 | Ht 62.0 in | Wt 135.6 lb

## 2017-10-06 DIAGNOSIS — D510 Vitamin B12 deficiency anemia due to intrinsic factor deficiency: Secondary | ICD-10-CM | POA: Diagnosis not present

## 2017-10-06 DIAGNOSIS — I10 Essential (primary) hypertension: Secondary | ICD-10-CM

## 2017-10-06 DIAGNOSIS — E876 Hypokalemia: Secondary | ICD-10-CM | POA: Diagnosis not present

## 2017-10-06 DIAGNOSIS — E1140 Type 2 diabetes mellitus with diabetic neuropathy, unspecified: Secondary | ICD-10-CM | POA: Diagnosis not present

## 2017-10-06 DIAGNOSIS — Z7901 Long term (current) use of anticoagulants: Secondary | ICD-10-CM

## 2017-10-06 DIAGNOSIS — R0609 Other forms of dyspnea: Secondary | ICD-10-CM | POA: Diagnosis not present

## 2017-10-06 DIAGNOSIS — R0600 Dyspnea, unspecified: Secondary | ICD-10-CM

## 2017-10-06 DIAGNOSIS — E119 Type 2 diabetes mellitus without complications: Secondary | ICD-10-CM | POA: Diagnosis not present

## 2017-10-06 LAB — CBC WITH DIFFERENTIAL/PLATELET
Basophils Absolute: 0 10*3/uL (ref 0.0–0.1)
Basophils Relative: 0.3 % (ref 0.0–3.0)
Eosinophils Absolute: 0.1 10*3/uL (ref 0.0–0.7)
Eosinophils Relative: 1.1 % (ref 0.0–5.0)
HCT: 38.6 % (ref 36.0–46.0)
Hemoglobin: 13.2 g/dL (ref 12.0–15.0)
Lymphocytes Relative: 38.5 % (ref 12.0–46.0)
Lymphs Abs: 3.3 10*3/uL (ref 0.7–4.0)
MCHC: 34.2 g/dL (ref 30.0–36.0)
MCV: 90.4 fl (ref 78.0–100.0)
Monocytes Absolute: 0.6 10*3/uL (ref 0.1–1.0)
Monocytes Relative: 7 % (ref 3.0–12.0)
Neutro Abs: 4.5 10*3/uL (ref 1.4–7.7)
Neutrophils Relative %: 53.1 % (ref 43.0–77.0)
Platelets: 182 10*3/uL (ref 150.0–400.0)
RBC: 4.26 Mil/uL (ref 3.87–5.11)
RDW: 13.4 % (ref 11.5–15.5)
WBC: 8.5 10*3/uL (ref 4.0–10.5)

## 2017-10-06 LAB — COMPREHENSIVE METABOLIC PANEL
ALT: 14 U/L (ref 0–35)
AST: 15 U/L (ref 0–37)
Albumin: 3.6 g/dL (ref 3.5–5.2)
Alkaline Phosphatase: 66 U/L (ref 39–117)
BUN: 23 mg/dL (ref 6–23)
CO2: 28 mEq/L (ref 19–32)
Calcium: 9.1 mg/dL (ref 8.4–10.5)
Chloride: 103 mEq/L (ref 96–112)
Creatinine, Ser: 0.79 mg/dL (ref 0.40–1.20)
GFR: 72.18 mL/min (ref >60.00)
Glucose, Bld: 160 mg/dL — ABNORMAL HIGH (ref 70–99)
Potassium: 3.3 mEq/L — ABNORMAL LOW (ref 3.5–5.1)
Sodium: 140 mEq/L (ref 135–145)
Total Bilirubin: 0.8 mg/dL (ref 0.2–1.2)
Total Protein: 6.6 g/dL (ref 6.0–8.3)

## 2017-10-06 LAB — HEMOGLOBIN A1C: Hgb A1c MFr Bld: 6.6 % — ABNORMAL HIGH (ref 4.6–6.5)

## 2017-10-06 NOTE — Progress Notes (Signed)
Subjective:  Patient ID: Kara [name], female    DOB: 1923/03/01  Age: 84 y.o. MRN: [id number]  CC: The primary encounter diagnosis was Controlled type 2 diabetes mellitus without complication, without long-term current use of insulin ([place]). Diagnoses of Pernicious anemia, Type 2 diabetes mellitus with diabetic neuropathy, without long-term current use of insulin ([place]), Exertional dyspnea, Essential hypertension, Chronic anticoagulation, and Hypokalemia were also pertinent to this visit.  HPI Kara [name] presents for 6 month follow up on diabetes.  Patient has no complaints today.  Patient is following a low glycemic index diet . Does not take meds and does not check sugars    Patient has had an eye exam in the last 12 months and checks feet regularly for signs of infection.  Patient does not walk barefoot outside,  And denies an numbness tingling or burning in feet. Patient is up to date on all recommended vaccinations   chronic  LE edema  Right >> left.  For 2 years.   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    lives at [place].  No longer walking on a regular basis .  Not afraid  Of falling   Recently took a balance class   saw [facility] yesterday ,  No changes to medications   Still gets easily fatigued with walking  Doesn;t walk regularly except to the dining hall.    Lab Results  Component Value Date   INR 2.6 (A) 09/09/2017   INR 2.4 (A) 08/26/2017   INR 1.3 (A) 08/03/2017   PROTIME 1.6 (A) 12/30/2016   PROTIME 2.4 (A) 04/18/2015      Outpatient Medications Prior to Visit  Medication Sig Dispense Refill  . CARTIA XT 180 MG 24 hr capsule TAKE ONE CAPSULE BY MOUTH EVERY MORNING 90 capsule 1  . cholecalciferol (VITAMIN D) 1000 UNITS tablet Take 1,000 Units by mouth daily.    . cyanocobalamin (,VITAMIN B-12,) 1000 MCG/ML injection INJECT 1 ML  INTO THE MUSCLE EVERY 30 DAYS. 10 mL 2  . diphenoxylate-atropine (LOMOTIL) 2.5-0.025 MG tablet TAKE ONE TABLET BY MOUTH TWICE A DAY  AS NEEDED FOR DIARRHEA 60 tablet 0  . estradiol (ESTRACE) 0.1 MG/GM vaginal cream Place 1 Applicatorful vaginally at bedtime. 42.5 g 0  . hydrocortisone-pramoxine (ANALPRAM-HC) 2.5-1 % rectal cream APPLY RECTALLY THREE TIMES DAILY 30 g 0  . Hypromellose (GENTEAL) 0.3 % SOLN Place 1 drop into both eyes daily as needed (dryness).     Marland Kitchen ketoconazole (NIZORAL) 2 % cream Apply 1 application topically daily as needed.   1  . metoprolol tartrate (LOPRESSOR) 50 MG tablet TAKE 1/2 TABLET BY MOUTH EVERY MORNING AND TAKE 1/2 TABLET BY MOUTH EVERY EVENING 30 tablet 3  . ondansetron (ZOFRAN) 4 MG tablet Take 1 tablet (4 mg total) by mouth every 8 (eight) hours as needed for nausea or vomiting. 30 tablet 0  . triamcinolone cream (KENALOG) 0.1 % Apply 1 application topically 2 (two) times daily. (Patient taking differently: Apply 1 application topically 2 (two) times daily as needed. ) 30 g 0  . warfarin (COUMADIN) 1 MG tablet TAKE ONE TABLET BY MOUTH DAILY AT 6PM 45 tablet 0   No facility-administered medications prior to visit.     Review of Systems;  Patient denies headache, fevers, malaise, unintentional weight loss, skin rash, eye pain, sinus congestion and sinus pain, sore throat, dysphagia,  hemoptysis , cough, dyspnea, wheezing, chest pain, palpitations, orthopnea, edema, abdominal pain, nausea, melena, diarrhea, constipation, flank  pain, dysuria, hematuria, urinary  Frequency, nocturia, numbness, tingling, seizures,  Focal weakness, Loss of consciousness,  Tremor, insomnia, depression, anxiety, and suicidal ideation.      Objective:  BP 128/64 (BP Location: Left Arm, Patient Position: Sitting, Cuff Size: Normal)   Pulse 87   Temp 98.1 F (36.7 C) (Oral)   Resp 14   Ht 5\' 2"  (1.575 m)   Wt 135 lb 9.6 oz (61.5 kg)   SpO2 94%   BMI 24.80 kg/m   BP Readings from Last 3 Encounters:  10/06/17 128/64  07/23/17 122/66  06/15/17 120/68    Wt Readings from Last 3 Encounters:  10/06/17 135 lb  9.6 oz (61.5 kg)  06/15/17 133 lb 3.2 oz (60.4 kg)  05/15/17 135 lb (61.2 kg)    General appearance: alert, cooperative and appears stated age Ears: normal TM's and external ear canals both ears Throat: lips, mucosa, and tongue normal; teeth and gums normal Neck: no adenopathy, no carotid bruit, supple, symmetrical, trachea midline and thyroid not enlarged, symmetric, no tenderness/mass/nodules Back: symmetric, no curvature. ROM normal. No CVA tenderness. Lungs: clear to auscultation bilaterally Heart: regular rate and rhythm, S1, S2 normal, no murmur, click, rub or gallop Abdomen: soft, non-tender; bowel sounds normal; no masses,  no organomegaly Pulses: 2+ and symmetric Skin: Skin color, texture, turgor normal. No rashes or lesions Lymph nodes: Cervical, supraclavicular, and axillary nodes normal.  Lab Results  Component Value Date   HGBA1C 6.6 (H) 10/06/2017   HGBA1C 6.7 (H) 04/07/2017   HGBA1C 6.4 06/17/2016    Lab Results  Component Value Date   CREATININE 0.79 10/06/2017   CREATININE 0.91 05/15/2017   CREATININE 0.87 04/20/2017    Lab Results  Component Value Date   WBC 8.5 10/06/2017   HGB 13.2 10/06/2017   HCT 38.6 10/06/2017   PLT 182.0 10/06/2017   GLUCOSE 160 (H) 10/06/2017   CHOL 142 10/11/2013   TRIG 194.0 (H) 10/11/2013   HDL 43.10 10/11/2013   LDLDIRECT 86.3 06/30/2012   LDLCALC 60 10/11/2013   ALT 14 10/06/2017   AST 15 10/06/2017   NA 140 10/06/2017   K 3.3 (L) 10/06/2017   CL 103 10/06/2017   CREATININE 0.79 10/06/2017   BUN 23 10/06/2017   CO2 28 10/06/2017   TSH 3.35 04/07/2017   INR 2.6 (A) 09/09/2017   HGBA1C 6.6 (H) 10/06/2017   MICROALBUR 1.0 04/20/2017    Dg Chest 2 View  Result Date: 05/15/2017 CLINICAL DATA:  Shortness of Breath EXAM: CHEST  2 VIEW COMPARISON:  May 22, 2014 FINDINGS: There is no edema or consolidation. Heart size and pulmonary vascularity are normal. Pacemaker leads are attached to the right atrium right  ventricle. There is aortic atherosclerosis. No adenopathy. There is evidence of an old healed fracture of the proximal left humerus. IMPRESSION: No edema or consolidation. Aortic atherosclerosis. Old fracture proximal left humerus. Aortic Atherosclerosis (ICD10-I70.0). Electronically Signed   By: Lowella Grip III M.D.   On: 05/15/2017 10:58   Ct Angio Chest Pe W And/or Wo Contrast  Result Date: 05/15/2017 CLINICAL DATA:  Acute onset of severe shortness of breath this morning. Worsening shortness of breath when lying flat. History of uterine and skin cancer and mitral valve regurgitation. Question pulmonary embolism. EXAM: CT ANGIOGRAPHY CHEST WITH CONTRAST TECHNIQUE: Multidetector CT imaging of the chest was performed using the standard protocol during bolus administration of intravenous contrast. Multiplanar CT image reconstructions and MIPs were obtained to evaluate the vascular anatomy. CONTRAST:  43mL ISOVUE-370 IOPAMIDOL (ISOVUE-370) INJECTION 76% COMPARISON:  Radiographs 05/15/2017. FINDINGS: Cardiovascular: The pulmonary arteries are well opacified with contrast to the level of the subsegmental branches. There is no evidence of acute pulmonary embolism. There is limited opacification of the systemic vessels. Mild atherosclerosis of the aorta, great vessels and coronary arteries. Left subclavian pacemaker leads appear unchanged in the right atrium and right ventricle. Mild right chamber cardiac enlargement, likely chronic. No pericardial effusion. Mediastinum/Nodes: There are no enlarged mediastinal, hilar or axillary lymph nodes.There are small mediastinal lymph nodes. The thyroid gland, trachea and esophagus demonstrate no significant findings. Lungs/Pleura: No pleural effusion or pneumothorax. The lungs are clear. Upper abdomen: The visualized upper abdomen appears unremarkable aside from vascular calcifications. Musculoskeletal/Chest wall: There is no chest wall mass or suspicious osseous  finding. Mild degenerative changes in the spine. Review of the MIP images confirms the above findings. IMPRESSION: 1. No evidence of acute pulmonary embolism or other acute chest process. 2. Coronary and Aortic Atherosclerosis (ICD10-I70.0). Mild right chamber cardiac enlargement post pacemaker. Electronically Signed   By: Richardean Sale M.D.   On: 05/15/2017 12:07    Assessment & Plan:   Problem List Items Addressed This Visit    Pernicious anemia    Lab Results  Component Value Date   NOMVEHMC94 709 04/07/2017   Lab Results  Component Value Date   WBC 10.6 05/15/2017   HGB 14.4 05/15/2017   HCT 43.0 05/15/2017   MCV 90.4 05/15/2017   PLT 212 05/15/2017         Relevant Orders   CBC with Differential/Platelet (Completed)   Type 2 diabetes mellitus with diabetic neuropathy, unspecified ([place])    Improved control with less dietary indulgences.   Given her age,  No treatment unless a1c is > 7.5  Lab Results  Component Value Date   HGBA1C 6.6 (H) 10/06/2017   Lab Results  Component Value Date   MICROALBUR 1.0 04/20/2017         Hypokalemia    Chronic ,  Managed with oral  liquid supplementation .  Repeat potassium is SLIGHTLY LOW,  LAST mg check was normal in December  Lab Results  Component Value Date   NA 140 10/06/2017   K 3.3 (L) 10/06/2017   CL 103 10/06/2017   CO2 28 10/06/2017         Exertional dyspnea    Secondary to deconditioning.  She has no history of heart failure or PAD. Encouraged to resume daily walking       Essential hypertension    Well controlled on current regimen. Renal function stable,  Lab Results  Component Value Date   CREATININE 0.79 10/06/2017   Lab Results  Component Value Date   NA 140 10/06/2017   K 3.3 (L) 10/06/2017   CL 103 10/06/2017   CO2 28 10/06/2017    no changes today.      RESOLVED: Controlled type 2 diabetes mellitus without complication, without long-term current use of insulin ([facility]) - Primary   Relevant  Orders   Comprehensive metabolic panel (Completed)   Hemoglobin A1c (Completed)   Chronic anticoagulation    DAILY DOSE IS 1 MG DAILY Lab Results  Component Value Date   INR 2.6 (A) 09/09/2017   INR 2.4 (A) 08/26/2017   INR 1.3 (A) 08/03/2017   PROTIME 1.6 (A) 12/30/2016   PROTIME 2.4 (A) 04/18/2015          A total of 25 minutes of face to face time  was spent with patient more than half of which was spent in counselling and coordination of care    I am having Daleen Squibb. Hetz maintain her cholecalciferol, Hypromellose, hydrocortisone-pramoxine, triamcinolone cream, estradiol, ondansetron, ketoconazole, warfarin, cyanocobalamin, CARTIA XT, diphenoxylate-atropine, and metoprolol tartrate.  No orders of the defined types were placed in this encounter.   There are no discontinued medications.  Follow-up: Return in about 6 months (around 04/08/2018) for follow up diabetes.   Crecencio Mc, MD

## 2017-10-06 NOTE — Assessment & Plan Note (Signed)
Lab Results  Component Value Date   VITAMINB12 321 04/07/2017   Lab Results  Component Value Date   WBC 10.6 05/15/2017   HGB 14.4 05/15/2017   HCT 43.0 05/15/2017   MCV 90.4 05/15/2017   PLT 212 05/15/2017

## 2017-10-06 NOTE — Patient Instructions (Addendum)
You are doing well!   I encourage you to start walking every day to keep your strength up and improve your fatigue   Start with a goal of 5  minutes, and eventually you can increase to 10 minutes

## 2017-10-07 DIAGNOSIS — Z7901 Long term (current) use of anticoagulants: Secondary | ICD-10-CM | POA: Diagnosis not present

## 2017-10-07 LAB — POCT INR: INR: 2.4 — AB (ref 0.9–1.1)

## 2017-10-08 NOTE — Assessment & Plan Note (Signed)
DAILY DOSE IS 1 MG DAILY Lab Results  Component Value Date   INR 2.6 (A) 09/09/2017   INR 2.4 (A) 08/26/2017   INR 1.3 (A) 08/03/2017   PROTIME 1.6 (A) 12/30/2016   PROTIME 2.4 (A) 04/18/2015

## 2017-10-08 NOTE — Assessment & Plan Note (Signed)
Secondary to deconditioning.  She has no history of heart failure or PAD. Encouraged to resume daily walking  

## 2017-10-08 NOTE — Assessment & Plan Note (Addendum)
Chronic ,  Managed with oral  liquid supplementation .  Repeat potassium is SLIGHTLY LOW,  LAST mg check was normal in December  Lab Results  Component Value Date   NA 140 10/06/2017   K 3.3 (L) 10/06/2017   CL 103 10/06/2017   CO2 28 10/06/2017

## 2017-10-08 NOTE — Assessment & Plan Note (Signed)
Improved control with less dietary indulgences.   Given her age,  No treatment unless a1c is > 7.5  Lab Results  Component Value Date   HGBA1C 6.6 (H) 10/06/2017   Lab Results  Component Value Date   MICROALBUR 1.0 04/20/2017

## 2017-10-08 NOTE — Assessment & Plan Note (Signed)
Well controlled on current regimen. Renal function stable,  Lab Results  Component Value Date   CREATININE 0.79 10/06/2017   Lab Results  Component Value Date   NA 140 10/06/2017   K 3.3 (L) 10/06/2017   CL 103 10/06/2017   CO2 28 10/06/2017    no changes today.

## 2017-10-09 ENCOUNTER — Telehealth: Payer: Self-pay

## 2017-10-09 NOTE — Telephone Encounter (Signed)
LMTCB. Need to let pt know that her INR results were therapeutic and she just needs to continue taking her current regimen. Per Dr. Lupita Dawn written order.

## 2017-10-13 DIAGNOSIS — I480 Paroxysmal atrial fibrillation: Secondary | ICD-10-CM | POA: Diagnosis not present

## 2017-10-15 ENCOUNTER — Ambulatory Visit: Payer: Medicare Other

## 2017-10-15 ENCOUNTER — Telehealth: Payer: Self-pay | Admitting: Internal Medicine

## 2017-10-15 ENCOUNTER — Other Ambulatory Visit: Payer: Self-pay | Admitting: Internal Medicine

## 2017-10-15 DIAGNOSIS — K529 Noninfective gastroenteritis and colitis, unspecified: Secondary | ICD-10-CM

## 2017-10-15 NOTE — Telephone Encounter (Signed)
LMTCB. PEC may speak with pt.  

## 2017-10-15 NOTE — Telephone Encounter (Signed)
Coumadin level is therapeutic,  Continue current regimen and repeat PT/INR in one week 

## 2017-10-16 ENCOUNTER — Ambulatory Visit (INDEPENDENT_AMBULATORY_CARE_PROVIDER_SITE_OTHER): Payer: Medicare Other

## 2017-10-16 VITALS — BP 112/64 | HR 67 | Temp 97.7°F | Resp 14 | Ht 62.0 in | Wt 133.1 lb

## 2017-10-16 DIAGNOSIS — Z0000 Encounter for general adult medical examination without abnormal findings: Secondary | ICD-10-CM | POA: Diagnosis not present

## 2017-10-16 NOTE — Progress Notes (Signed)
Subjective:   Kara [name] is a 84 y.o. female who presents for Medicare Annual (Subsequent) preventive examination.  Review of Systems:  No ROS.  Medicare Wellness Visit. Additional risk factors are reflected in the social history. Cardiac Risk Factors include: hypertension;advanced age (>30men, >7 women);diabetes mellitus     Objective:     Vitals: BP 112/64 (BP Location: Left Arm, Patient Position: Sitting, Cuff Size: Normal)   Pulse 67   Temp 97.7 F (36.5 C) (Oral)   Resp 14   Ht 5\' 2"  (1.575 m)   Wt 133 lb 1.9 oz (60.4 kg)   SpO2 96%   BMI 24.35 kg/m   Body mass index is 24.35 kg/m.  Advanced Directives 10/16/2017 11/11/2016 10/14/2016 02/12/2016 12/13/2015 12/12/2015 12/04/2015  Does Patient Have a Medical Advance Directive? Yes Yes Yes No Yes Yes Yes  Type of Advance Directive Out of facility DNR (pink MOST or yellow form) [place];Living will [place];Living will - [place];Living will Living will;Healthcare Power of [place];Living will  Does patient want to make changes to medical advance directive? No - Patient declined - No - Patient declined - No - Patient declined No - Patient declined -  Copy of [place] in Chart? - - No - copy requested - - No - copy requested No - copy requested    Tobacco Social History   Tobacco Use  Smoking Status Former Smoker  Smokeless Tobacco Never Used  Tobacco Comment   quit 45 years ago     Counseling given: Not Answered Comment: quit 45 years ago   Clinical Intake:  Pre-visit preparation completed: Yes  Pain : No/denies pain     Nutritional Status: BMI 25 -29 Overweight Diabetes: (Followed by pcp)  How often do you need to have someone help you when you read instructions, pamphlets, or other written materials from your doctor or pharmacy?: 1 - Never  Interpreter Needed?: No     Past Medical History:    Diagnosis Date  . Allergy   . Atrial fibrillation ([place])   . CAD (coronary artery disease)   . Cancer ([facility])    UTERINE  . Chronic anticoagulation   . Chronic diarrhea   . Decubitus ulcer    sacral region  . Diabetes ([place])    diet controlled  . Dysuria   . Edema of lower extremity    mainly right foot, slightly in left foot.  . Fibrocystic breast disease   . GERD (gastroesophageal reflux disease)   . Glaucoma   . Glaucoma   . Hematuria   . Hemorrhoids   . History of colon polyps   . History of pancreatitis   . Hyperlipidemia   . Hypertension   . Hypokalemia   . IBS (irritable bowel syndrome)   . Microscopic hematuria   . Mitral valve regurgitation   . Osteoarthritis    fingers  . Pernicious anemia   . Plantar fasciitis   . Recurrent UTI   . Skin cancer   . Sleep apnea, obstructive   . Vaginal atrophy   . Vitamin D deficiency   . Yeast vaginitis    Past Surgical History:  Procedure Laterality Date  . ABDOMINAL HYSTERECTOMY  1980's  . ABDOMINAL SURGERY     for villous polyp,,,many years ago  . APPENDECTOMY  1940's  . ASCAD, s/p PTCA  11/28/2005   MID LESION   . BREAST BIOPSY Left 1970's  .  CARPAL TUNNEL RELEASE Right 12/13/2015   Procedure: CARPAL TUNNEL RELEASE;  Surgeon: Thornton Park, MD;  Location: [facility] ORS;  Service: Orthopedics;  Laterality: Right;  . COLECTOMY    . PACEMAKER PLACEMENT    . radation     for uterine cance  . REFRACTIVE SURGERY     for bilateral glaumoma  . TONSILLECTOMY  1936   Family History  Problem Relation Age of Onset  . Coronary artery disease Father   . Kidney disease Father   . Cancer Sister   . Diabetes Unknown   . Cancer Mother        COLON  . Bladder Cancer Neg Hx   . Breast cancer Neg Hx    Social History   Socioeconomic History  . Marital status: Widowed    Spouse name: Not on file  . Number of children: 3  . Years of education: Not on file  . Highest education level: Not on file  Occupational History  .  Occupation: Retired Restaurant manager, fast food - primary  Social Needs  . Financial resource strain: Not hard at all  . Food insecurity:    Worry: Never true    Inability: Never true  . Transportation needs:    Medical: No    Non-medical: No  Tobacco Use  . Smoking status: Former Research scientist (life sciences)  . Smokeless tobacco: Never Used  . Tobacco comment: quit 45 years ago  Substance and Sexual Activity  . Alcohol use: Yes    Alcohol/week: 0.0 oz    Comment: Rarely, 1-2 times a year  . Drug use: No  . Sexual activity: Never  Lifestyle  . Physical activity:    Days per week: Not on file    Minutes per session: Not on file  . Stress: Not on file  Relationships  . Social connections:    Talks on phone: Not on file    Gets together: Not on file    Attends religious service: Not on file    Active member of club or organization: Not on file    Attends meetings of clubs or organizations: Not on file    Relationship status: Not on file  Other Topics Concern  . Not on file  Social History Narrative   Lives at [place]. Born in [place]. Travels frequently.      1 daughter, 2 step children      Regular Exercise -  NO   Daily Caffeine Use:  1 coffee             Outpatient Encounter Medications as of 10/16/2017  Medication Sig  . CARTIA XT 180 MG 24 hr capsule TAKE ONE CAPSULE BY MOUTH EVERY MORNING  . cholecalciferol (VITAMIN D) 1000 UNITS tablet Take 1,000 Units by mouth daily.  . cyanocobalamin (,VITAMIN B-12,) 1000 MCG/ML injection INJECT 1 ML  INTO THE MUSCLE EVERY 30 DAYS.  Marland Kitchen diphenoxylate-atropine (LOMOTIL) 2.5-0.025 MG tablet TAKE ONE TABLET BY MOUTH TWICE A DAY AS NEEDED FOR DIARRHEA  . estradiol (ESTRACE) 0.1 MG/GM vaginal cream Place 1 Applicatorful vaginally at bedtime.  . hydrocortisone-pramoxine (ANALPRAM-HC) 2.5-1 % rectal cream APPLY RECTALLY THREE TIMES DAILY  . Hypromellose (GENTEAL) 0.3 % SOLN Place 1 drop into both eyes daily as needed (dryness).   Marland Kitchen ketoconazole  (NIZORAL) 2 % cream Apply 1 application topically daily as needed.   . metoprolol tartrate (LOPRESSOR) 50 MG tablet TAKE 1/2 TABLET BY MOUTH EVERY MORNING AND TAKE 1/2 TABLET BY MOUTH EVERY EVENING  .  ondansetron (ZOFRAN) 4 MG tablet Take 1 tablet (4 mg total) by mouth every 8 (eight) hours as needed for nausea or vomiting.  . triamcinolone cream (KENALOG) 0.1 % Apply 1 application topically 2 (two) times daily. (Patient taking differently: Apply 1 application topically 2 (two) times daily as needed. )  . warfarin (COUMADIN) 1 MG tablet TAKE ONE TABLET BY MOUTH DAILY AT 6PM   No facility-administered encounter medications on file as of 10/16/2017.     Activities of Daily Living In your present state of health, do you have any difficulty performing the following activities: 10/16/2017  Hearing? N  Vision? N  Difficulty concentrating or making decisions? N  Comment difficulty remembering names and appointments  Walking or climbing stairs? Y  Comment unsteady gait  Dressing or bathing? N  Doing errands, shopping? N  Preparing Food and eating ? N  Using the Toilet? N  In the past six months, have you accidently leaked urine? N  Do you have problems with loss of bowel control? N  Managing your Medications? N  Managing your Finances? N  Housekeeping or managing your Housekeeping? Y  Comment Staff assists  Some recent data might be hidden    Patient Care Team: Crecencio Mc, MD as PCP - General (Internal Medicine)    Assessment:   This is a routine wellness examination for Kara [name].  The goal of the wellness visit is to assist the patient how to close the gaps in care and create a preventative care plan for the patient.   The roster of all physicians providing medical care to patient is listed in the Snapshot section of the chart.  Taking calcium VIT D as appropriate/Osteoporosis risk reviewed.    Safety issues reviewed; Smoke and carbon monoxide detectors in the home. No firearms in  the home. Wears seatbelts when driving or riding with others. No violence in the home.  They do not have excessive sun exposure.  Discussed the need for sun protection: hats, long sleeves and the use of sunscreen if there is significant sun exposure.  Patient is alert, normal appearance, oriented to person/place/and time.  Correctly identified the president of the [place] and recalls of 2/3 words. Performs simple calculations and can read correct time from watch face.   No new identified risk were noted.  No failures at ADL's or IADL's.    BMI- discussed the importance of a healthy diet, water intake and the benefits of aerobic exercise. Educational material provided.   24 hour diet recall: Regular diet  Dental- every 6 months.  Eye- Visual acuity not assessed per patient preference since they have regular follow up with the ophthalmologist.  Wears corrective lenses.  Sleep patterns- Sleeps 8 hours at night.  Wakes feeling rested. CPAP not in use.  Diabetes-followed by pcp. States she forgets to take her medications in the evening when she leaves to play cards. I gave her a weekly travel medication holder to take with her when leaving her apartment. Encouraged her to check her BS daily.   Health maintenance gaps- closed.  Patient Concerns: None at this time. Follow up with PCP as needed.  Exercise Activities and Dietary recommendations Current Exercise Habits: The patient does not participate in regular exercise at present  Goals    . Increase physical activity     Stay active and walk for exercises     . Increase water intake     Stay hydrated! Drink plenty of water.  Fall Risk Fall Risk  10/16/2017 12/31/2016 10/14/2016 09/15/2016 06/17/2016  Falls in the past year? No No Yes Yes No  Number falls in past yr: - - 1 1 -  Injury with Fall? - - No No -  Comment - - - - -  Follow up - - Falls prevention discussed;Education provided - -  Comment - - Foot slipped when  sitting down - -  Depression Screen PHQ 2/9 Scores 10/16/2017 12/31/2016 10/14/2016 09/15/2016  PHQ - 2 Score 0 0 0 0  PHQ- 9 Score - - 0 -     Cognitive Function MMSE - Mini Mental State Exam 08/01/2015  Orientation to time 5  Orientation to Place 5  Registration 3  Attention/ Calculation 5  Recall 3  Language- name 2 objects 2  Language- repeat 1  Language- follow 3 step command 3  Language- read & follow direction 1  Write a sentence 1  Copy design 1  Total score 30     6CIT Screen 10/16/2017 10/14/2016  What Year? 0 points 0 points  What month? 0 points 0 points  What time? 0 points 0 points  Count back from 20 0 points 0 points  Months in reverse 0 points 0 points  Repeat phrase - 0 points  Total Score - 0    Immunization History  Administered Date(s) Administered  . Influenza Whole 02/28/2013  . Influenza,inj,Quad PF,6+ Mos 01/29/2015  . Influenza-Unspecified 02/19/2010, 03/17/2012, 03/02/2014, 02/09/2016, 01/25/2017  . Pneumococcal Conjugate-13 09/01/2013  . Pneumococcal Polysaccharide-23 04/07/2017  . Pneumococcal-Unspecified 04/15/2007  . Tdap 11/11/2011  . Zoster 08/31/2009, 10/12/2011   Screening Tests Health Maintenance  Topic Date Due  . INFLUENZA VACCINE  12/17/2017  . HEMOGLOBIN A1C  04/08/2018  . URINE MICROALBUMIN  04/20/2018  . OPHTHALMOLOGY EXAM  09/24/2018  . FOOT EXAM  10/07/2018  . TETANUS/TDAP  11/10/2021  . DEXA SCAN  Completed  . PNA vac Low Risk Adult  Completed      Plan:    End of life planning; Advance aging; Advanced directives discussed. Copy of current HCPOA/Living Will requested.    I have personally reviewed and noted the following in the patient's chart:   . Medical and social history . Use of alcohol, tobacco or illicit drugs  . Current medications and supplements . Functional ability and status . Nutritional status . Physical activity . Advanced directives . List of other physicians . Hospitalizations, surgeries, and  ER visits in previous 12 months . Vitals . Screenings to include cognitive, depression, and falls . Referrals and appointments  In addition, I have reviewed and discussed with patient certain preventive protocols, quality metrics, and best practice recommendations. A written personalized care plan for preventive services as well as general preventive health recommendations were provided to patient.     Varney Biles, LPN  [id number]/[id number]

## 2017-10-16 NOTE — Telephone Encounter (Signed)
Refilled: 09/18/2017 Last OV: 10/06/2017 Next OV: 04/12/2018

## 2017-10-16 NOTE — Telephone Encounter (Signed)
Patient called returning message regarding coumadin level  Telephone  Note created 10/15/2017 By Dr Derrel Nip read to pt

## 2017-10-16 NOTE — Telephone Encounter (Signed)
Is she needing a refill now?  She was given 60 on 09/18/17.  I see she has been using, but it appears the need has increased.  If any change or worsening problems, can be reevaluated.  Let me know if a problem.

## 2017-10-16 NOTE — Telephone Encounter (Signed)
Called pt and left message to return call regarding INR results and to schedule pt for 1 week repeat of INR. PEC ok to speak with pt.

## 2017-10-16 NOTE — Telephone Encounter (Signed)
LMTCB. PEC may speak with pt.  

## 2017-10-16 NOTE — Patient Instructions (Addendum)
  Kara [name] , Thank you for taking time to come for your Medicare Wellness Visit. I appreciate your ongoing commitment to your health goals. Please review the following plan we discussed and let me know if I can assist you in the future.   Follow up as needed.    Bring a copy of your DNR to be scanned into chart.  Have a great day!  These are the goals we discussed: Goals    . Increase physical activity     Stay active and walk for exercises     . Increase water intake     Stay hydrated! Drink plenty of water.       This is a list of the screening recommended for you and due dates:  Health Maintenance  Topic Date Due  . Flu Shot  12/17/2017  . Hemoglobin A1C  04/08/2018  . Urine Protein Check  04/20/2018  . Eye exam for diabetics  09/24/2018  . Complete foot exam   10/07/2018  . Tetanus Vaccine  11/10/2021  . DEXA scan (bone density measurement)  Completed  . Pneumonia vaccines  Completed

## 2017-10-19 ENCOUNTER — Telehealth: Payer: Self-pay

## 2017-10-19 NOTE — Telephone Encounter (Signed)
Called pt to see if she needed a refill on her Lomotil since it is just as needed. The pt stated that she did not need a refill at this time. While on the phone with the pt she stated that on Friday she wiped one time after going to the bathroom and she noticed two very small dots of blood on the toilet paper and then again on Saturday morning. The pt stated that she has not had any since. Asked the pt is it was after and bowel movement and she stated it was not, she also stated that she did feel anything that felt sore or tender liek a bump. Pt stated that her daughter is coming into town for a week this week and she will keep an eye on it and let us know if it happens again once her daughter leaves. She stated that she doe snot want to spend time at the doctor while her daughter is in town.

## 2017-10-19 NOTE — Telephone Encounter (Signed)
That's fine.  This is not urgent and does not require an OV unless it become frequent

## 2017-10-23 ENCOUNTER — Encounter: Payer: Self-pay | Admitting: Internal Medicine

## 2017-10-26 ENCOUNTER — Telehealth: Payer: Self-pay | Admitting: Internal Medicine

## 2017-10-26 NOTE — Progress Notes (Signed)
Agree with plan. Feliza Diven, NP  

## 2017-10-26 NOTE — Telephone Encounter (Signed)
Spoke with pt and informed her of her INR results and that she needs to continue her current coumadin dose, then check her INR again in one week. Pt gave a verbal understanding.

## 2017-10-26 NOTE — Telephone Encounter (Signed)
  Your INR/coumadin level is therapeutic,  Continue current regimen and repeat PT/INR in one week   

## 2017-10-31 ENCOUNTER — Other Ambulatory Visit: Payer: Self-pay | Admitting: Internal Medicine

## 2017-10-31 DIAGNOSIS — I4891 Unspecified atrial fibrillation: Secondary | ICD-10-CM

## 2017-10-31 LAB — POCT INR: INR: 2.6 — AB (ref <=1.1)

## 2017-11-02 ENCOUNTER — Telehealth: Payer: Self-pay

## 2017-11-02 NOTE — Telephone Encounter (Signed)
Coumadin level is therapeutic,  Continue current regimen and repeat PT/INR in one week 

## 2017-11-02 NOTE — Telephone Encounter (Signed)
Spoke with pt and informed her of her lab results. Pt gave a verbal understanding.  

## 2017-11-02 NOTE — Telephone Encounter (Signed)
INR result is 2.6, dated 10/31/2017. Result placed in yellow folder.

## 2017-11-06 DIAGNOSIS — Z7901 Long term (current) use of anticoagulants: Secondary | ICD-10-CM | POA: Diagnosis not present

## 2017-11-06 LAB — POCT INR: INR: 2.3 — AB (ref <=1.1)

## 2017-11-10 ENCOUNTER — Telehealth: Payer: Self-pay

## 2017-11-10 NOTE — Telephone Encounter (Signed)
Thank you!  Coumadin level is therapeutic,  Continue current regimen and repeat PT/INR in one week

## 2017-11-10 NOTE — Telephone Encounter (Signed)
PT INR result 2.3. Result placed in yellow folder. Lab has been abstracted.

## 2017-11-11 MED ORDER — CYANOCOBALAMIN 1000 MCG/ML IJ SOLN: INTRAMUSCULAR | 0 refills | Status: DC

## 2017-11-11 NOTE — Telephone Encounter (Signed)
Spoke with pt and informed her of her lab results. Pt gave a verbal understanding. Pt stated that she needed a refill on her b12 injections. Refill sent.

## 2017-11-14 ENCOUNTER — Encounter: Payer: Self-pay | Admitting: Family

## 2017-11-14 LAB — PROTIME-INR

## 2017-11-16 ENCOUNTER — Telehealth: Payer: Self-pay

## 2017-11-16 NOTE — Telephone Encounter (Signed)
INR result 2.6. Result has been placed in yellow result folder.

## 2017-11-16 NOTE — Telephone Encounter (Signed)
Coumadin level is therapeutic at 2.6  ,  Continue current regimen and repeat PT/INR in one week Lab Results  Component Value Date   INR 2.3 (A) 11/06/2017   INR 2.6 (A) 10/31/2017   INR 2.4 (A) 10/07/2017   PROTIME 1.6 (A) 12/30/2016   PROTIME 2.4 (A) 04/18/2015

## 2017-11-17 ENCOUNTER — Encounter (INDEPENDENT_AMBULATORY_CARE_PROVIDER_SITE_OTHER): Payer: Medicare Other

## 2017-11-17 ENCOUNTER — Ambulatory Visit (INDEPENDENT_AMBULATORY_CARE_PROVIDER_SITE_OTHER): Payer: Medicare Other | Admitting: Vascular Surgery

## 2017-11-17 NOTE — Telephone Encounter (Signed)
LMTCB. PEC may speak with pt.  

## 2017-11-17 NOTE — Telephone Encounter (Addendum)
Pt. returned call.  Advised of recommendations per Dr. Derrel Nip to continue current regimen and repeat PT/INR in one week. Verb. understanding of instructions.    The pt. stated she will recheck her INR on Friday, as that is her usual day to check it.

## 2017-11-20 LAB — POCT INR: INR: 1.6 — AB (ref 0.9–1.1)

## 2017-11-23 ENCOUNTER — Telehealth: Payer: Self-pay

## 2017-11-23 NOTE — Telephone Encounter (Signed)
THE HOLIDAY FOOD MUST BE CAUSING HER INR TO DROP. ASK HER TO TAKE 2 MG TONIGHT ONLY,  AND RESUME 1 MG DAILY STARTING TOMORROW.  REPEAT INR IN 1 WEEKS

## 2017-11-23 NOTE — Telephone Encounter (Signed)
Spoke with pt and informed her of her INR results and the dose change. Pt gave a verbal understanding.

## 2017-11-23 NOTE — Telephone Encounter (Signed)
INR result from 11/20/2017 is 1.6. Pt is taking 1mg  daily. Result has been placed in yellow folder.

## 2017-11-24 ENCOUNTER — Ambulatory Visit (INDEPENDENT_AMBULATORY_CARE_PROVIDER_SITE_OTHER): Payer: Medicare Other

## 2017-11-24 ENCOUNTER — Encounter (INDEPENDENT_AMBULATORY_CARE_PROVIDER_SITE_OTHER): Payer: Self-pay | Admitting: Vascular Surgery

## 2017-11-24 ENCOUNTER — Ambulatory Visit (INDEPENDENT_AMBULATORY_CARE_PROVIDER_SITE_OTHER): Payer: Medicare Other | Admitting: Vascular Surgery

## 2017-11-24 VITALS — BP 128/70 | HR 84 | Resp 13 | Ht 62.0 in | Wt 132.0 lb

## 2017-11-24 DIAGNOSIS — I10 Essential (primary) hypertension: Secondary | ICD-10-CM

## 2017-11-24 DIAGNOSIS — E1140 Type 2 diabetes mellitus with diabetic neuropathy, unspecified: Secondary | ICD-10-CM

## 2017-11-24 DIAGNOSIS — R600 Localized edema: Secondary | ICD-10-CM | POA: Diagnosis not present

## 2017-11-24 DIAGNOSIS — I739 Peripheral vascular disease, unspecified: Secondary | ICD-10-CM

## 2017-11-24 NOTE — Assessment & Plan Note (Signed)
blood pressure control important in reducing the progression of atherosclerotic disease. On appropriate oral medications.  

## 2017-11-24 NOTE — Progress Notes (Signed)
MRN : [id number]  Kara [name] is a 84 y.o. ([birth year]) female who presents with chief complaint of  Chief Complaint  Patient presents with  . Follow-up    1 year ABI  .  History of Present Illness: Patient returns today in follow up of PAD.  She is still troubled by swelling in her right leg which has been chronic.  She has not been able to get her stockings on and off so the swelling is gotten somewhat worse.  She has some hip and arthritic pain in both legs.  She has had decline in her walking distances since her last visit.  She says both legs bother her about the same. Her noninvasive studies today demonstrate stable ABIs of 0.94 on the right and 0.70 on the left.  These are essentially unchanged from her study one year ago.  Current Outpatient Medications  Medication Sig Dispense Refill  . CARTIA XT 180 MG 24 hr capsule TAKE ONE CAPSULE BY MOUTH EVERY MORNING 90 capsule 1  . cholecalciferol (VITAMIN D) 1000 UNITS tablet Take 1,000 Units by mouth daily.    . cyanocobalamin (,VITAMIN B-12,) 1000 MCG/ML injection INJECT 1 ML  INTO THE MUSCLE EVERY 30 DAYS. 10 mL 0  . metoprolol tartrate (LOPRESSOR) 50 MG tablet TAKE 1/2 TABLET BY MOUTH EVERY MORNING AND TAKE 1/2 TABLET BY MOUTH EVERY EVENING 30 tablet 3  . triamcinolone cream (KENALOG) 0.1 % Apply 1 application topically 2 (two) times daily. (Patient taking differently: Apply 1 application topically 2 (two) times daily as needed. ) 30 g 0  . warfarin (COUMADIN) 1 MG tablet TAKE ONE TABLET BY MOUTH DAILY AT 6PM 30 tablet 2  . diphenoxylate-atropine (LOMOTIL) 2.5-0.025 MG tablet TAKE ONE TABLET BY MOUTH TWICE A DAY AS NEEDED FOR DIARRHEA (Patient not taking: Reported on 11/24/2017) 60 tablet 0  . estradiol (ESTRACE) 0.1 MG/GM vaginal cream Place 1 Applicatorful vaginally at bedtime. (Patient not taking: Reported on 11/24/2017) 42.5 g 0  . hydrocortisone-pramoxine (ANALPRAM-HC) 2.5-1 % rectal cream APPLY RECTALLY THREE TIMES DAILY (Patient not  taking: Reported on 11/24/2017) 30 g 0  . Hypromellose (GENTEAL) 0.3 % SOLN Place 1 drop into both eyes daily as needed (dryness).     Marland Kitchen ketoconazole (NIZORAL) 2 % cream Apply 1 application topically daily as needed.   1  . ondansetron (ZOFRAN) 4 MG tablet Take 1 tablet (4 mg total) by mouth every 8 (eight) hours as needed for nausea or vomiting. (Patient not taking: Reported on 11/24/2017) 30 tablet 0   No current facility-administered medications for this visit.     Past Medical History:  Diagnosis Date  . Allergy   . Atrial fibrillation ([place])   . CAD (coronary artery disease)   . Cancer (HCC)    UTERINE  . Chronic anticoagulation   . Chronic diarrhea   . Decubitus ulcer    sacral region  . Diabetes ([place])    diet controlled  . Dysuria   . Edema of lower extremity    mainly right foot, slightly in left foot.  . Fibrocystic breast disease   . GERD (gastroesophageal reflux disease)   . Glaucoma   . Glaucoma   . Hematuria   . Hemorrhoids   . History of colon polyps   . History of pancreatitis   . Hyperlipidemia   . Hypertension   . Hypokalemia   . IBS (irritable bowel syndrome)   . Microscopic hematuria   . Mitral valve regurgitation   .  Osteoarthritis    fingers  . Pernicious anemia   . Plantar fasciitis   . Recurrent UTI   . Skin cancer   . Sleep apnea, obstructive   . Vaginal atrophy   . Vitamin D deficiency   . Yeast vaginitis     Past Surgical History:  Procedure Laterality Date  . ABDOMINAL HYSTERECTOMY  1980's  . ABDOMINAL SURGERY     for villous polyp,,,many years ago  . APPENDECTOMY  1940's  . ASCAD, s/p PTCA  11/28/2005   MID LESION   . BREAST BIOPSY Left 1970's  . CARPAL TUNNEL RELEASE Right 12/13/2015   Procedure: CARPAL TUNNEL RELEASE;  Surgeon: Thornton Park, MD;  Location: [facility] ORS;  Service: Orthopedics;  Laterality: Right;  . COLECTOMY    . PACEMAKER PLACEMENT    . radation     for uterine cance  . REFRACTIVE SURGERY     for bilateral  glaumoma  . TONSILLECTOMY  1936    Social History Social History   Tobacco Use  . Smoking status: Former Research scientist (life sciences)  . Smokeless tobacco: Never Used  . Tobacco comment: quit 45 years ago  Substance Use Topics  . Alcohol use: Yes    Alcohol/week: 0.0 oz    Comment: Rarely, 1-2 times a year  . Drug use: No     Family History Family History  Problem Relation Age of Onset  . Coronary artery disease Father   . Kidney disease Father   . Cancer Sister   . Diabetes Unknown   . Cancer Mother        COLON  . Bladder Cancer Neg Hx   . Breast cancer Neg Hx      Allergies  Allergen Reactions  . Baycol [Cerivastatin Sodium]   . Cardizem [Diltiazem Hcl]     LOWER EXTREMITY EDEMA  . Crestor [Rosuvastatin Calcium] Other (See Comments)    INTOLERANT  . Dronedarone Other (See Comments)    Fatigue  . Metoprolol Tartrate Other (See Comments)  . Omeprazole Other (See Comments)  . Pravachol     INTOLERANT  . Statins Other (See Comments)  . Vioxx [Rofecoxib]   . Zocor [Simvastatin]     INTOLERANT     REVIEW OF SYSTEMS (Negative unless checked)  Constitutional: [] Weight loss  [] Fever  [] Chills Cardiac: [] Chest pain   [] Chest pressure   [x] Palpitations   [] Shortness of breath when laying flat   [] Shortness of breath at rest   [x] Shortness of breath with exertion. Vascular:  [] Pain in legs with walking   [] Pain in legs at rest   [] Pain in legs when laying flat   [x] Claudication   [] Pain in feet when walking  [] Pain in feet at rest  [] Pain in feet when laying flat   [] History of DVT   [] Phlebitis   [x] Swelling in legs   [] Varicose veins   [] Non-healing ulcers Pulmonary:   [] Uses home oxygen   [] Productive cough   [] Hemoptysis   [] Wheeze  [] COPD   [] Asthma Neurologic:  [] Dizziness  [] Blackouts   [] Seizures   [] History of stroke   [] History of TIA  [] Aphasia   [] Temporary blindness   [] Dysphagia   [] Weakness or numbness in arms   [] Weakness or numbness in legs Musculoskeletal:  [x] Arthritis    [] Joint swelling   [] Joint pain   [] Low back pain Hematologic:  [] Easy bruising  [] Easy bleeding   [] Hypercoagulable state   [] Anemic   Gastrointestinal:  [] Blood in stool   [] Vomiting blood  [] Gastroesophageal reflux/heartburn   []   Abdominal pain Genitourinary:  [] Chronic kidney disease   [] Difficult urination  [] Frequent urination  [] Burning with urination   [] Hematuria Skin:  [] Rashes   [] Ulcers   [] Wounds Psychological:  [] History of anxiety   []  History of major depression.  Physical Examination  BP 128/70 (BP Location: Right Arm, Patient Position: Sitting)   Pulse 84   Resp 13   Ht 5\' 2"  (1.575 m)   Wt 132 lb (59.9 kg)   BMI 24.14 kg/m  Gen:  WD/WN, NAD. Appears younger than stated age. Head: [place]/AT, No temporalis wasting. Ear/Nose/Throat: Hearing grossly intact, nares w/o erythema or drainage Eyes: Conjunctiva clear. Sclera non-icteric Neck: Supple.  Trachea midline Pulmonary:  Good air movement, no use of accessory muscles.  Cardiac: irregularly irregular Vascular:  Vessel Right Left  Radial Palpable Palpable                          PT  1+ palpable  1+ palpable  DP  2+ palpable  1+ palpable    Musculoskeletal: M/S 5/5 throughout.  No deformity or atrophy.  1-2+ right lower extremity edema, trace left lower extremity edema. Neurologic: Sensation grossly intact in extremities.  Symmetrical.  Speech is fluent.  Psychiatric: Judgment intact, Mood & affect appropriate for pt's clinical situation. Dermatologic: No rashes or ulcers noted.  No cellulitis or open wounds.       Labs Recent Results (from the past 2160 hour(s))  Protime-INR     Status: Abnormal   Collection Time: 09/09/17 12:00 AM  Result Value Ref Range   INR 2.6 (A) 0.9 - 1.1  HM DIABETES EYE EXAM     Status: None   Collection Time: 09/23/17 12:00 AM  Result Value Ref Range   HM Diabetic Eye Exam No Retinopathy No Retinopathy  Protime-INR     Status: None   Collection Time: 10/01/17 12:00 AM    Result Value Ref Range   INR  0.9 - 1.1  Comprehensive metabolic panel     Status: Abnormal   Collection Time: 10/06/17  1:33 PM  Result Value Ref Range   Sodium 140 135 - 145 mEq/L   Potassium 3.3 (L) 3.5 - 5.1 mEq/L   Chloride 103 96 - 112 mEq/L   CO2 28 19 - 32 mEq/L   Glucose, Bld 160 (H) 70 - 99 mg/dL   BUN 23 6 - 23 mg/dL   Creatinine, Ser 0.79 0.40 - 1.20 mg/dL   Total Bilirubin 0.8 0.2 - 1.2 mg/dL   Alkaline Phosphatase 66 39 - 117 U/L   AST 15 0 - 37 U/L   ALT 14 0 - 35 U/L   Total Protein 6.6 6.0 - 8.3 g/dL   Albumin 3.6 3.5 - 5.2 g/dL   Calcium 9.1 8.4 - 10.5 mg/dL   GFR 72.18 >60.00 mL/min  Hemoglobin A1c     Status: Abnormal   Collection Time: 10/06/17  1:33 PM  Result Value Ref Range   Hgb A1c MFr Bld 6.6 (H) 4.6 - 6.5 %    Comment: Glycemic Control Guidelines for People with Diabetes:Non Diabetic:  <6%Goal of Therapy: <7%Additional Action Suggested:  >8%   CBC with Differential/Platelet     Status: None   Collection Time: 10/06/17  1:43 PM  Result Value Ref Range   WBC 8.5 4.0 - 10.5 K/uL   RBC 4.26 3.87 - 5.11 Mil/uL   Hemoglobin 13.2 12.0 - 15.0 g/dL   HCT 38.6 36.0 - 46.0 %  MCV 90.4 78.0 - 100.0 fl   MCHC 34.2 30.0 - 36.0 g/dL   RDW 13.4 11.5 - 15.5 %   Platelets 182.0 150.0 - 400.0 K/uL   Neutrophils Relative % 53.1 43.0 - 77.0 %   Lymphocytes Relative 38.5 12.0 - 46.0 %   Monocytes Relative 7.0 3.0 - 12.0 %   Eosinophils Relative 1.1 0.0 - 5.0 %   Basophils Relative 0.3 0.0 - 3.0 %   Neutro Abs 4.5 1.4 - 7.7 K/uL   Lymphs Abs 3.3 0.7 - 4.0 K/uL   Monocytes Absolute 0.6 0.1 - 1.0 K/uL   Eosinophils Absolute 0.1 0.0 - 0.7 K/uL   Basophils Absolute 0.0 0.0 - 0.1 K/uL  POCT INR     Status: Abnormal   Collection Time: 10/07/17 12:00 AM  Result Value Ref Range   INR 2.4 (A) 0.9 - 1.1  POCT INR     Status: Abnormal   Collection Time: 10/31/17 12:00 AM  Result Value Ref Range   INR 2.6 (A) 0.9 - 1.1  POCT INR     Status: Abnormal   Collection  Time: 11/06/17 12:00 AM  Result Value Ref Range   INR 2.3 (A) 0.9 - 1.1  POCT INR     Status: Abnormal   Collection Time: 11/20/17 12:00 AM  Result Value Ref Range   INR 1.6 (A) 0.9 - 1.1    Radiology No results found.  Assessment/Plan  Essential hypertension blood pressure control important in reducing the progression of atherosclerotic disease. On appropriate oral medications.   Type 2 diabetes mellitus with diabetic neuropathy, unspecified (HCC) blood glucose control important in reducing the progression of atherosclerotic disease. Also, involved in wound healing. On appropriate medications.   Edema of lower extremity Recommended that she try some zippered compression stockings since she can no longer get the regular compression stockings on or off.  Peripheral artery disease ([place]) Her noninvasive studies today demonstrate stable ABIs of 0.94 on the right and 0.70 on the left.  These are essentially unchanged from her study one year ago. Her lower extremity symptoms are likely multifactorial, although PAD is probably at least a contributing risk factor more so on the left.  Given her age and non-limb threatening symptoms, I would not recommend invention at this time.  Plan to recheck in 1 year.    Leotis Pain, MD  11/24/2017 11:15 AM    This note was created with Dragon medical transcription system.  Any errors from dictation are purely unintentional

## 2017-11-24 NOTE — Patient Instructions (Signed)

## 2017-11-24 NOTE — Assessment & Plan Note (Signed)
Her noninvasive studies today demonstrate stable ABIs of 0.94 on the right and 0.70 on the left.  These are essentially unchanged from her study one year ago. Her lower extremity symptoms are likely multifactorial, although PAD is probably at least a contributing risk factor more so on the left.  Given her age and non-limb threatening symptoms, I would not recommend invention at this time.  Plan to recheck in 1 year.

## 2017-11-24 NOTE — Assessment & Plan Note (Signed)
Recommended that she try some zippered compression stockings since she can no longer get the regular compression stockings on or off.

## 2017-11-24 NOTE — Assessment & Plan Note (Signed)
blood glucose control important in reducing the progression of atherosclerotic disease. Also, involved in wound healing. On appropriate medications.  

## 2017-11-27 ENCOUNTER — Encounter: Payer: Self-pay | Admitting: Internal Medicine

## 2017-11-27 LAB — PROTIME-INR

## 2017-12-01 ENCOUNTER — Telehealth: Payer: Self-pay

## 2017-12-01 NOTE — Telephone Encounter (Signed)
Pt's INR from 11/27/2017 is 2.2. Pt is currently taking 1mg  Coumadin daily. Result has been placed in yellow result folder.

## 2017-12-01 NOTE — Telephone Encounter (Signed)
Coumadin level is therapeutic ON 1 MG DAILY ,  Continue current regimen and repeat PT/INR in one week

## 2017-12-01 NOTE — Telephone Encounter (Signed)
Spoke with pt and informed her of her INR results and that she will need to continue her current regimen of 1mg  daily. Pt gave a verbal understanding.

## 2017-12-04 DIAGNOSIS — Z7901 Long term (current) use of anticoagulants: Secondary | ICD-10-CM | POA: Diagnosis not present

## 2017-12-04 LAB — POCT INR: INR: 2.1 — AB (ref 0.9–1.1)

## 2017-12-07 ENCOUNTER — Telehealth: Payer: Self-pay

## 2017-12-07 NOTE — Telephone Encounter (Signed)
LMTCB. PEC may speak with pt.  

## 2017-12-07 NOTE — Telephone Encounter (Signed)
INR results 2.1. Coumadin dose 1mg  daily. Result placed in yellow folder.

## 2017-12-07 NOTE — Telephone Encounter (Signed)
Coumadin level is therapeutic,  Continue current regimen and repeat PT/INR in one week 

## 2017-12-08 NOTE — Telephone Encounter (Signed)
Spoke with pt and informed her of her INR results and that she needs to continue her current coumadin dose. Also let pt know that she would need to recheck in one week. Pt gave a verbal understanding.

## 2017-12-10 ENCOUNTER — Other Ambulatory Visit: Payer: Self-pay

## 2017-12-10 ENCOUNTER — Telehealth: Payer: Self-pay | Admitting: Internal Medicine

## 2017-12-10 DIAGNOSIS — I4891 Unspecified atrial fibrillation: Secondary | ICD-10-CM

## 2017-12-10 MED ORDER — DILTIAZEM HCL ER COATED BEADS 180 MG PO CP24: 180.0000 mg | ORAL_CAPSULE | Freq: Every morning | ORAL | 1 refills | Status: DC

## 2017-12-10 NOTE — Telephone Encounter (Signed)
rx refill for Cartia XT has been sent to [facility]

## 2017-12-10 NOTE — Telephone Encounter (Unsigned)
Copied from [place] [id number]. Topic: Quick Communication - Rx Refill/Question >> Dec 10, 2017  9:58 AM Judyann Munson wrote: Medication: CARTIA XT 180 MG 24 hr capsule  Has the patient contacted their pharmacy? No   Preferred Pharmacy (with phone number or street name): [facility], [place] [phone] (Phone) [phone] (Fax)     Agent: Please be advised that RX refills may take up to 3 business days. We ask that you follow-up with your pharmacy.

## 2017-12-12 LAB — POCT INR: INR: 1.9 — AB (ref 0.9–1.1)

## 2017-12-15 ENCOUNTER — Telehealth: Payer: Self-pay

## 2017-12-15 NOTE — Telephone Encounter (Signed)
Coumadin level is therapeutic,  Continue current regimen and repeat PT/INR in one week 

## 2017-12-15 NOTE — Telephone Encounter (Signed)
PT/INR from 12/12/2017 was 1.9. Pt is taking 1mg  daily. Result has been put in yellow results folder and has been abstracted.

## 2017-12-15 NOTE — Telephone Encounter (Signed)
Spoke with pt and informed her of her INR results and no medication changes at this time, recheck INR in one week. Pt gave a verbal understanding.

## 2017-12-18 ENCOUNTER — Ambulatory Visit (INDEPENDENT_AMBULATORY_CARE_PROVIDER_SITE_OTHER): Payer: Medicare Other | Admitting: Family Medicine

## 2017-12-18 ENCOUNTER — Encounter: Payer: Self-pay | Admitting: Family Medicine

## 2017-12-18 VITALS — BP 140/68 | HR 76 | Temp 97.5°F | Resp 18 | Ht 62.0 in | Wt 134.5 lb

## 2017-12-18 DIAGNOSIS — R300 Dysuria: Secondary | ICD-10-CM | POA: Diagnosis not present

## 2017-12-18 DIAGNOSIS — N390 Urinary tract infection, site not specified: Secondary | ICD-10-CM

## 2017-12-18 LAB — POCT URINALYSIS DIPSTICK
Glucose, UA: NEGATIVE
Ketones, UA: NEGATIVE
Nitrite, UA: NEGATIVE
Protein, UA: NEGATIVE
Spec Grav, UA: 1.01 (ref 1.010–1.025)
Urobilinogen, UA: 0.2 E.U./dL
pH, UA: 6 (ref 5.0–8.0)

## 2017-12-18 MED ORDER — SULFAMETHOXAZOLE-TRIMETHOPRIM 800-160 MG PO TABS: 1.0000 | ORAL_TABLET | Freq: Two times a day (BID) | ORAL | 0 refills | Status: AC

## 2017-12-18 NOTE — Progress Notes (Signed)
Subjective:    Patient ID: Kara [name], female    DOB: 02-08-1923, 84 y.o.   MRN: [id number]  HPI Patient presents to clinic with painful urination, urgency and frequency for 1 day. She also notes that her urine had a pink color to it this AM. Denies nausea, vomiting, diarrhea, fever or chills.    She is on coumadin and gets INR checked weekly: Lab Results  Component Value Date   INR 1.9 (A) 12/12/2017   INR 2.1 (A) 12/04/2017   INR 1.6 (A) 11/20/2017   PROTIME 1.6 (A) 12/30/2016   PROTIME 2.4 (A) 04/18/2015   Patient Active Problem List   Diagnosis Date Noted  . Edema of lower extremity 11/24/2017  . Exertional dyspnea 06/16/2017  . Essential hypertension 11/11/2016  . Peripheral artery disease ([place]) 11/11/2016  . Leg swelling 06/17/2016  . Type 2 diabetes mellitus with diabetic neuropathy, unspecified ([place]) 06/17/2016  . Diarrhea 06/17/2016  . Urinary frequency 12/27/2015  . Atrophic vaginitis 09/26/2015  . Umbilical hernia without obstruction and without gangrene 06/26/2015  . Carpal tunnel syndrome of right wrist 05/29/2015  . Chronic fatigue 05/01/2015  . Left leg pain 05/01/2015  . Closed fracture of part of upper end of humerus 01/29/2015  . Skin lesion 01/29/2015  . Recurrent UTI 11/21/2014  . Vaginal atrophy 11/21/2014  . Urethral caruncle 11/21/2014  . Mitral valve regurgitation 05/09/2014  . History of pancreatitis 03/09/2014  . Fatigue 03/09/2014  . Hypokalemia 04/12/2013  . Encounter for current long-term use of anticoagulants 10/06/2012  . Hemorrhoids 06/08/2012  . Osteoarthritis 03/08/2012  . Screening for breast cancer 03/08/2012  . Pernicious anemia 02/04/2012  . History of colectomy 02/04/2012  . Vitamin D deficiency 07/30/2011  . Chronic anticoagulation 06/13/2011  . Atrial fibrillation ([place]) 06/13/2011  . CAD (coronary artery disease) 06/13/2011  . Chronic diarrhea 06/13/2011    Review of Systems  Constitutional: Negative for activity  change, chills and fever.  HENT: Negative.   Eyes: Negative.   Respiratory: Negative for cough, shortness of breath and wheezing.   Cardiovascular: Negative for chest pain, palpitations and leg swelling.  Gastrointestinal: Negative for abdominal distention, diarrhea, nausea and vomiting.  Genitourinary: Positive for decreased urine volume, dysuria, hematuria and urgency.  Musculoskeletal: Negative.   Skin: Negative for color change and pallor.  Neurological: Negative for dizziness, syncope and light-headedness.       Objective:   Physical Exam  Constitutional: She is oriented to person, place, and time. She appears well-developed and well-nourished. No distress.  HENT:  Head: Normocephalic and atraumatic.  Eyes: EOM are normal. No scleral icterus.  Cardiovascular: Normal rate, regular rhythm and normal heart sounds. Exam reveals no gallop and no friction rub.  No murmur heard. Pulmonary/Chest: Effort normal and breath sounds normal. No respiratory distress. She has no wheezes.  Abdominal: Soft. Bowel sounds are normal. There is tenderness. There is no rebound and no guarding.  +mild suprapubic tenderness with palpation. Reports pain is more so with urination  Neurological: She is alert and oriented to person, place, and time. Coordination normal.  Gait normal.   Skin: Skin is warm and dry. No pallor.  Psychiatric: She has a normal mood and affect. Her behavior is normal.  Nursing note and vitals reviewed.  Vitals:   12/18/17 1605  BP: 140/68  Pulse: 76  Resp: 18  Temp: (!) 97.5 F (36.4 C)  SpO2: 97%      Assessment & Plan:  Dysuria -- Increase water intake. Avoid  sugary drinks. Urine culture sent to lab. UA dipstick +leuks +blood  UTI -- Bactrim BID for 5 days. Patient aware that antibiotics can effect INR levels, she checks her INR every week at home.

## 2017-12-18 NOTE — Patient Instructions (Signed)
Great to meet you!  Bactrim 2 times per day for 5 days Increase water intake & avoid excess sugary type drinks Antibiotics can effect coumadin - I see you get your INR checked every week, be sure to keep INR checks up to date

## 2017-12-19 ENCOUNTER — Encounter: Payer: Self-pay | Admitting: Internal Medicine

## 2017-12-19 LAB — URINE CULTURE
MICRO NUMBER:: 90916325
SPECIMEN QUALITY:: ADEQUATE

## 2017-12-19 LAB — PROTIME-INR

## 2017-12-21 ENCOUNTER — Telehealth: Payer: Self-pay

## 2017-12-21 NOTE — Telephone Encounter (Signed)
° ° °  Gave pt information per Dr Derrel Nip and she understood

## 2017-12-21 NOTE — Telephone Encounter (Signed)
Her INR is low. She needs to increase her dose to 2 mg on Mondays and Thursdays,  And continue 1 mg all other days,  Repeat  inr one week

## 2017-12-21 NOTE — Telephone Encounter (Signed)
INR result from 12/19/2017 is 1.8. Pt is taking coumadin 1mg  daily. Result has been placed in yellow folder.

## 2017-12-22 ENCOUNTER — Telehealth: Payer: Self-pay | Admitting: Internal Medicine

## 2017-12-22 NOTE — Telephone Encounter (Signed)
Please see previous message in regards to this INR result.

## 2017-12-22 NOTE — Telephone Encounter (Signed)
INR results °

## 2017-12-22 NOTE — Telephone Encounter (Signed)
Copied from [place] [id number]. Topic: General - Other >> Dec 22, 2017  9:05 AM Gardiner Ramus wrote: Reason for CRM: Baxter Flattery calling from [facility] IN[facility] called and stated that pt INR is out of range. Test was done 12/19/17 INR=1.8. Please advise. Tara Cb#[phone]

## 2017-12-23 ENCOUNTER — Telehealth: Payer: Self-pay | Admitting: Internal Medicine

## 2017-12-23 DIAGNOSIS — B3731 Acute candidiasis of vulva and vagina: Secondary | ICD-10-CM

## 2017-12-23 DIAGNOSIS — B373 Candidiasis of vulva and vagina: Secondary | ICD-10-CM

## 2017-12-23 MED ORDER — FLUCONAZOLE 150 MG PO TABS: ORAL_TABLET | ORAL | 0 refills | Status: DC

## 2017-12-23 NOTE — Telephone Encounter (Signed)
Yes I can - Rx for diflucan sent

## 2017-12-23 NOTE — Telephone Encounter (Signed)
Patient was put on Bactrim for UTI and now feels she has a yeast infection, patient seen by NP on 12/18/17.

## 2017-12-23 NOTE — Telephone Encounter (Unsigned)
Copied from [place] [id number]. Topic: General - Other >> Dec 23, 2017  3:01 PM Judyann Munson wrote: Reason for CRM: Patient is calling to request a medication be called in for yeast due to the new medication sulfamethoxazole-trimethoprim (BACTRIM DS,SEPTRA DS) [id number] MG tablet she has stated. Please advise   Preferred pharmacy: [facility], [place] [phone] (Phone) [phone] (Fax)

## 2017-12-24 NOTE — Telephone Encounter (Signed)
Patient notified and voiced understanding.

## 2017-12-26 LAB — PROTIME-INR: INR: 3.2 — AB (ref 0.9–1.1)

## 2017-12-28 ENCOUNTER — Telehealth: Payer: Self-pay

## 2017-12-28 NOTE — Telephone Encounter (Signed)
INR result from 12/26/2017 is 3.2. Pt stated that she was low last week so she was told to increase to 2mg  on Monday and 2mg  on Thursday and take 1mg  all other days.

## 2017-12-28 NOTE — Telephone Encounter (Signed)
Her dose was increased last week for low INR>  Now it is too high.  Resume 1 mg daily dose and repeat iN Rin 1 weeks

## 2017-12-28 NOTE — Telephone Encounter (Signed)
Spoke with pt and informed her that Dr. Derrel Nip would like for her to go back to taking 1 mg daily of her coumadin and recheck her INR again in one week. Pt gave a verbal understanding.

## 2018-01-02 DIAGNOSIS — Z7901 Long term (current) use of anticoagulants: Secondary | ICD-10-CM | POA: Diagnosis not present

## 2018-01-02 LAB — PROTIME-INR: INR: 3.7 — AB (ref 0.9–1.1)

## 2018-01-06 ENCOUNTER — Telehealth: Payer: Self-pay

## 2018-01-06 NOTE — Telephone Encounter (Signed)
INR result is 3.7. Pt stated that she has been taking 1mg  daily except for this past Saturday, she stated that she did not take one that night. Pt stated that she was not told to not take the coumadin on Saturday, she stated that she just used her "common sense". Pt states other than that she has been taking 1mg  daily.

## 2018-01-06 NOTE — Telephone Encounter (Signed)
INR is still too high Suspend coumadin tonight and tomorrow.  Resume 1 mg daily starting on august 23rd .  Repeat iNR in one week

## 2018-01-06 NOTE — Telephone Encounter (Signed)
Spoke with pt and informed her of her INR result and coumadin dose change. The pt gave a verbal understanding.

## 2018-01-09 LAB — POCT INR: INR: 1.5 — AB (ref 0.9–1.1)

## 2018-01-11 ENCOUNTER — Telehealth: Payer: Self-pay | Admitting: Internal Medicine

## 2018-01-11 ENCOUNTER — Telehealth: Payer: Self-pay

## 2018-01-11 NOTE — Telephone Encounter (Signed)
INR was high at 3.7  One week ago,  Results not in chart.  Tell her to resume 1 mg daily and recheck this Friday.  All INRs have to be abstracted on the weekly patients.

## 2018-01-11 NOTE — Telephone Encounter (Signed)
Pt's INR result from 01/09/2018 was 1.5. Pt was told to hold her coumadin on Thursday and Friday then to resume 1mg  daily on Saturday 01/08/2018.

## 2018-01-11 NOTE — Telephone Encounter (Signed)
Patient aware of results.

## 2018-01-11 NOTE — Telephone Encounter (Signed)
Disregard last message, I did not mean to write to you

## 2018-01-15 LAB — POCT INR: INR: 2.1 — AB (ref <=1.1)

## 2018-01-15 LAB — PROTIME-INR: INR: 2.1 — AB (ref 0.9–1.1)

## 2018-01-20 ENCOUNTER — Telehealth: Payer: Self-pay

## 2018-01-20 NOTE — Telephone Encounter (Signed)
INR result is 2.1 from 01/15/2018. Pt is currently taking 1mg  daily.

## 2018-01-20 NOTE — Telephone Encounter (Signed)
Coumadin level is therapeutic,  Continue current regimen and repeat PT/INR in one week 

## 2018-01-21 NOTE — Telephone Encounter (Signed)
Spoke with pt and informed her of her lab results and that she needs to continue her current coumadin regimen 1mg  daily and recheck INR again in one week. Pt gave a verbal understanding.

## 2018-01-23 LAB — POCT INR: INR: 2.1 — AB (ref <=1.1)

## 2018-01-23 LAB — PROTIME-INR: INR: 2.1 — AB (ref 0.9–1.1)

## 2018-01-25 ENCOUNTER — Telehealth: Payer: Self-pay

## 2018-01-25 NOTE — Telephone Encounter (Signed)
Called and notified patient that coumadin levels are therapeutic and to continue current regiment and repeat PT/INR in one week. Patient verbalized understanding.

## 2018-01-25 NOTE — Telephone Encounter (Signed)
Coumadin level is therapeutic,  Continue current regimen and repeat PT/INR in one week 

## 2018-01-25 NOTE — Telephone Encounter (Signed)
INR result is 2.1 form 01/23/2018. Pt is currently taking 1 mg daily. Result has been placed in yellow result folder and abstracted in chart.

## 2018-01-29 DIAGNOSIS — Z7901 Long term (current) use of anticoagulants: Secondary | ICD-10-CM | POA: Diagnosis not present

## 2018-01-29 LAB — POCT INR: INR: 2.2 — AB (ref 0.9–1.1)

## 2018-02-01 ENCOUNTER — Telehealth: Payer: Self-pay

## 2018-02-01 NOTE — Telephone Encounter (Signed)
INR result from 01/29/2018 is 2.2. Pt is currently taking 1mg  Coumadin daily.

## 2018-02-01 NOTE — Telephone Encounter (Signed)
Coumadin level is therapeutic,  Continue current regimen and repeat PT/INR in one week 

## 2018-02-01 NOTE — Telephone Encounter (Signed)
Spoke with pt and informed her that her INR results are therapeutic and that she should continue her current dose of 1mg  daily. Pt gave a verbal understanding.

## 2018-02-02 ENCOUNTER — Encounter: Payer: Self-pay | Admitting: Internal Medicine

## 2018-02-02 ENCOUNTER — Ambulatory Visit (INDEPENDENT_AMBULATORY_CARE_PROVIDER_SITE_OTHER): Payer: Medicare Other | Admitting: Internal Medicine

## 2018-02-02 VITALS — BP 114/56 | HR 101 | Temp 98.3°F | Resp 16 | Ht 62.0 in | Wt 137.2 lb

## 2018-02-02 DIAGNOSIS — N952 Postmenopausal atrophic vaginitis: Secondary | ICD-10-CM | POA: Diagnosis not present

## 2018-02-02 DIAGNOSIS — R300 Dysuria: Secondary | ICD-10-CM | POA: Diagnosis not present

## 2018-02-02 LAB — POCT URINALYSIS DIPSTICK
Bilirubin, UA: NEGATIVE
Blood, UA: POSITIVE
Glucose, UA: NEGATIVE
Ketones, UA: NEGATIVE
Nitrite, UA: NEGATIVE
Protein, UA: POSITIVE — AB
Spec Grav, UA: 1.02 (ref 1.010–1.025)
Urobilinogen, UA: 0.2 E.U./dL
pH, UA: 6 (ref 5.0–8.0)

## 2018-02-02 MED ORDER — ESTRADIOL 0.1 MG/GM VA CREA: 2.0000 g | TOPICAL_CREAM | Freq: Every day | VAGINAL | 11 refills | Status: DC

## 2018-02-02 NOTE — Patient Instructions (Addendum)
Your last infection was actually just inflammation due to atrophic vaginitis,  So Your current symptoms may be from atrophic vaginitis  Instead of a UTI .  We will wait for the urine culture before prescribing antibiotics   I recommend that you start using the estrace vaginal cream fo rthe atrophic vaginitis.  Use  1 -2 grams every night for the first 2 weeks,   Then reduce your use to 2 times per WEEK   Atrophic Vaginitis Atrophic vaginitis is a condition in which the tissues that line the vagina become dry and thin. This condition is most common in women who have stopped having regular menstrual periods (menopause). This usually starts when a woman is 25-59 years old. Estrogen helps to keep the vagina moist. It stimulates the vagina to produce a clear fluid that lubricates the vagina for sexual intercourse. This fluid also protects the vagina from infection. Lack of estrogen can cause the lining of the vagina to get thinner and dryer. The vagina may also shrink in size. It may become less elastic. Atrophic vaginitis tends to get worse over time as a woman's estrogen level drops. What are the causes? This condition is caused by the normal drop in estrogen that happens around the time of menopause. What increases the risk? Certain conditions or situations may lower a woman's estrogen level, which increases her risk of atrophic vaginitis. These include:  Taking medicine that blocks estrogen.  Having ovaries removed surgically.  Being treated for cancer with X-ray treatment (radiation) or medicines (chemotherapy).  Exercising very hard and often.  Having an eating disorder (anorexia).  Giving birth or breastfeeding.  Being over the age of 42.  Smoking.  What are the signs or symptoms? Symptoms of this condition include:  Pain, soreness, or bleeding during sexual intercourse (dyspareunia).  Vaginal burning, irritation, or itching.  Pain or bleeding during a vaginal examination  using a speculum (pelvic exam).  Loss of interest in sexual activity.  Having burning pain when passing urine.  Vaginal discharge that is brown or yellow.  In some cases, there are no symptoms. How is this diagnosed? This condition is diagnosed with a medical history and physical exam. This will include a pelvic exam that checks whether the inside of your vagina appears pale, thin, or dry. Rarely, you may also have other tests, including:  A urine test.  A test that checks the acid balance in your vaginal fluid (acid balance test).  How is this treated? Treatment for this condition may depend on the severity of your symptoms. Treatment may include:  Using an over-the-counter vaginal lubricant before you have sexual intercourse.  Using a long-acting vaginal moisturizer.  Using low-dose vaginal estrogen for moderate to severe symptoms that do not respond to other treatments. Options include creams, tablets, and inserts (vaginal rings). Before using vaginal estrogen, tell your health care provider if you have a history of: ? Breast cancer. ? Endometrial cancer. ? Blood clots.  Taking medicines. You may be able to take a daily pill for dyspareunia. Discuss all of the risks of this medicine with your health care provider. It is usually not recommended for women who have a family history or personal history of breast cancer.  If your symptoms are very mild and you are not sexually active, you may not need treatment. Follow these instructions at home:  Take medicines only as directed by your health care provider. Do not use herbal or alternative medicines unless your health care provider says that  you can.  Use over-the-counter creams, lubricants, or moisturizers for dryness only as directed by your health care provider.  If your atrophic vaginitis is caused by menopause, discuss all of your menopausal symptoms and treatment options with your health care provider.  Do not  douche.  Do not use products that can make your vagina dry. These include: ? Scented feminine sprays. ? Scented tampons. ? Scented soaps.  If it hurts to have sex, talk with your sexual partner. Contact a health care provider if:  Your discharge looks different than normal.  Your vagina has an unusual smell.  You have new symptoms.  Your symptoms do not improve with treatment.  Your symptoms get worse. This information is not intended to replace advice given to you by your health care provider. Make sure you discuss any questions you have with your health care provider. Document Released: 09/19/2014 Document Revised: 10/11/2015 Document Reviewed: 04/26/2014 Elsevier Interactive Patient Education  [employer].

## 2018-02-02 NOTE — Assessment & Plan Note (Signed)
Mild, with last urine culture negative . symptoms may be due to AV.  Will await culture data

## 2018-02-02 NOTE — Progress Notes (Signed)
Subjective:  Patient ID: Kara [name], female    DOB: Apr 20, 1923  Age: 84 y.o. MRN: [id number]  CC: The primary encounter diagnosis was Dysuria. A diagnosis of Atrophic vaginitis was also pertinent to this visit.  HPI Kara [name] presents for evaluation and treatment of dysuria. patient states that she has been having frequency, hesitancy,  Occasional incontinence  ,  Itching  and slight pain and noticed a tinge of pink on the toilet tissue yesterday.   Treated for UTI on August 2  Empirically with Bactrim x 5 days .  Felt miserable on Bactrim and had diarrhea . Culture was negative    Patient  Has a history of atrophic vaginitis, but has not used estrogen vaginal cream since 2017 ,   and takes coumadin daily   Outpatient Medications Prior to Visit  Medication Sig Dispense Refill  . cholecalciferol (VITAMIN D) 1000 UNITS tablet Take 1,000 Units by mouth daily.    . cyanocobalamin (,VITAMIN B-12,) 1000 MCG/ML injection INJECT 1 ML  INTO THE MUSCLE EVERY 30 DAYS. 10 mL 0  . diltiazem (CARTIA XT) 180 MG 24 hr capsule Take 1 capsule (180 mg total) by mouth every morning. 90 capsule 1  . diphenoxylate-atropine (LOMOTIL) 2.5-0.025 MG tablet TAKE ONE TABLET BY MOUTH TWICE A DAY AS NEEDED FOR DIARRHEA 60 tablet 0  . fluconazole (DIFLUCAN) 150 MG tablet Take 1 tablet (150mg ) by mouth on day 1, take the second by mouth tablet 3 days later. 2 tablet 0  . hydrocortisone-pramoxine (ANALPRAM-HC) 2.5-1 % rectal cream APPLY RECTALLY THREE TIMES DAILY 30 g 0  . Hypromellose (GENTEAL) 0.3 % SOLN Place 1 drop into both eyes daily as needed (dryness).     Marland Kitchen ketoconazole (NIZORAL) 2 % cream Apply 1 application topically daily as needed.   1  . metoprolol tartrate (LOPRESSOR) 50 MG tablet TAKE 1/2 TABLET BY MOUTH EVERY MORNING AND TAKE 1/2 TABLET BY MOUTH EVERY EVENING 30 tablet 3  . ondansetron (ZOFRAN) 4 MG tablet Take 1 tablet (4 mg total) by mouth every 8 (eight) hours as needed for nausea or vomiting.  (Patient not taking: Reported on 11/24/2017) 30 tablet 0  . triamcinolone cream (KENALOG) 0.1 % Apply 1 application topically 2 (two) times daily. (Patient taking differently: Apply 1 application topically 2 (two) times daily as needed. ) 30 g 0  . warfarin (COUMADIN) 1 MG tablet TAKE ONE TABLET BY MOUTH DAILY AT 6PM 30 tablet 2  . estradiol (ESTRACE) 0.1 MG/GM vaginal cream Place 1 Applicatorful vaginally at bedtime. 42.5 g 0   No facility-administered medications prior to visit.     Review of Systems;  Patient denies headache, fevers, malaise, unintentional weight loss, skin rash, eye pain, sinus congestion and sinus pain, sore throat, dysphagia,  hemoptysis , cough, dyspnea, wheezing, chest pain, palpitations, orthopnea, edema, abdominal pain, nausea, melena, diarrhea, constipation, flank pain, dysuria, hematuria, urinary  Frequency, nocturia, numbness, tingling, seizures,  Focal weakness, Loss of consciousness,  Tremor, insomnia, depression, anxiety, and suicidal ideation.      Objective:  BP (!) 114/56 (BP Location: Left Arm, Patient Position: Sitting, Cuff Size: Normal)   Pulse (!) 101   Temp 98.3 F (36.8 C) (Oral)   Resp 16   Ht 5\' 2"  (1.575 m)   Wt 137 lb 3.2 oz (62.2 kg)   SpO2 96%   BMI 25.09 kg/m   BP Readings from Last 3 Encounters:  02/02/18 (!) 114/56  12/18/17 140/68  11/24/17 128/70  Wt Readings from Last 3 Encounters:  02/02/18 137 lb 3.2 oz (62.2 kg)  12/18/17 134 lb 8 oz (61 kg)  11/24/17 132 lb (59.9 kg)    General appearance: alert, cooperative and appears stated age Ears: normal TM's and external ear canals both ears Throat: lips, mucosa, and tongue normal; teeth and gums normal Neck: no adenopathy, no carotid bruit, supple, symmetrical, trachea midline and thyroid not enlarged, symmetric, no tenderness/mass/nodules Back: symmetric, no curvature. ROM normal. No CVA tenderness. Lungs: clear to auscultation bilaterally Heart: regular rate and rhythm,  S1, S2 normal, no murmur, click, rub or gallop Abdomen: soft, non-tender; bowel sounds normal; no masses,  no organomegaly Pulses: 2+ and symmetric Skin: Skin color, texture, turgor normal. No rashes or lesions Lymph nodes: Cervical, supraclavicular, and axillary nodes normal.  Lab Results  Component Value Date   HGBA1C 6.6 (H) 10/06/2017   HGBA1C 6.7 (H) 04/07/2017   HGBA1C 6.4 06/17/2016    Lab Results  Component Value Date   CREATININE 0.79 10/06/2017   CREATININE 0.91 05/15/2017   CREATININE 0.87 04/20/2017    Lab Results  Component Value Date   WBC 8.5 10/06/2017   HGB 13.2 10/06/2017   HCT 38.6 10/06/2017   PLT 182.0 10/06/2017   GLUCOSE 160 (H) 10/06/2017   CHOL 142 10/11/2013   TRIG 194.0 (H) 10/11/2013   HDL 43.10 10/11/2013   LDLDIRECT 86.3 06/30/2012   LDLCALC 60 10/11/2013   ALT 14 10/06/2017   AST 15 10/06/2017   NA 140 10/06/2017   K 3.3 (L) 10/06/2017   CL 103 10/06/2017   CREATININE 0.79 10/06/2017   BUN 23 10/06/2017   CO2 28 10/06/2017   TSH 3.35 04/07/2017   INR 2.2 (A) 01/29/2018   HGBA1C 6.6 (H) 10/06/2017   MICROALBUR 1.0 04/20/2017    Dg Chest 2 View  Result Date: 05/15/2017 CLINICAL DATA:  Shortness of Breath EXAM: CHEST  2 VIEW COMPARISON:  May 22, 2014 FINDINGS: There is no edema or consolidation. Heart size and pulmonary vascularity are normal. Pacemaker leads are attached to the right atrium right ventricle. There is aortic atherosclerosis. No adenopathy. There is evidence of an old healed fracture of the proximal left humerus. IMPRESSION: No edema or consolidation. Aortic atherosclerosis. Old fracture proximal left humerus. Aortic Atherosclerosis (ICD10-I70.0). Electronically Signed   By: Lowella Grip III M.D.   On: 05/15/2017 10:58   Ct Angio Chest Pe W And/or Wo Contrast  Result Date: 05/15/2017 CLINICAL DATA:  Acute onset of severe shortness of breath this morning. Worsening shortness of breath when lying flat. History of  uterine and skin cancer and mitral valve regurgitation. Question pulmonary embolism. EXAM: CT ANGIOGRAPHY CHEST WITH CONTRAST TECHNIQUE: Multidetector CT imaging of the chest was performed using the standard protocol during bolus administration of intravenous contrast. Multiplanar CT image reconstructions and MIPs were obtained to evaluate the vascular anatomy. CONTRAST:  54mL ISOVUE-370 IOPAMIDOL (ISOVUE-370) INJECTION 76% COMPARISON:  Radiographs 05/15/2017. FINDINGS: Cardiovascular: The pulmonary arteries are well opacified with contrast to the level of the subsegmental branches. There is no evidence of acute pulmonary embolism. There is limited opacification of the systemic vessels. Mild atherosclerosis of the aorta, great vessels and coronary arteries. Left subclavian pacemaker leads appear unchanged in the right atrium and right ventricle. Mild right chamber cardiac enlargement, likely chronic. No pericardial effusion. Mediastinum/Nodes: There are no enlarged mediastinal, hilar or axillary lymph nodes.There are small mediastinal lymph nodes. The thyroid gland, trachea and esophagus demonstrate no significant findings. Lungs/Pleura: No  pleural effusion or pneumothorax. The lungs are clear. Upper abdomen: The visualized upper abdomen appears unremarkable aside from vascular calcifications. Musculoskeletal/Chest wall: There is no chest wall mass or suspicious osseous finding. Mild degenerative changes in the spine. Review of the MIP images confirms the above findings. IMPRESSION: 1. No evidence of acute pulmonary embolism or other acute chest process. 2. Coronary and Aortic Atherosclerosis (ICD10-I70.0). Mild right chamber cardiac enlargement post pacemaker. Electronically Signed   By: Richardean Sale M.D.   On: 05/15/2017 12:07    Assessment & Plan:   Problem List Items Addressed This Visit    Atrophic vaginitis    Diagnosis explained and discussed. Advised to resume estrace cream.       Dysuria -  Primary    Mild, with last urine culture negative . symptoms may be due to AV.  Will await culture data       Relevant Orders   POCT urinalysis dipstick (Completed)   Urine Microscopic Only   Urine Culture     A total of 25 minutes of face to face time was spent with patient more than half of which was spent in counselling about the above mentioned conditions  and coordination of care  I have changed Daleen Squibb. [place]'s estradiol. I am also having her maintain her cholecalciferol, Hypromellose, hydrocortisone-pramoxine, triamcinolone cream, ondansetron, ketoconazole, diphenoxylate-atropine, metoprolol tartrate, warfarin, cyanocobalamin, diltiazem, and fluconazole.  Meds ordered this encounter  Medications  . estradiol (ESTRACE) 0.1 MG/GM vaginal cream    Sig: Place 2 g vaginally at bedtime. For 2 weeks,  Then reduce dose to 1 gram every 3-4 days    Dispense:  42.5 g    Refill:  11    Medications Discontinued During This Encounter  Medication Reason  . estradiol (ESTRACE) 0.1 MG/GM vaginal cream Reorder    Follow-up: No follow-ups on file.   Crecencio Mc, MD

## 2018-02-02 NOTE — Assessment & Plan Note (Signed)
Diagnosis explained and discussed. Advised to resume estrace cream.

## 2018-02-03 LAB — URINALYSIS, MICROSCOPIC ONLY

## 2018-02-04 LAB — URINE CULTURE
MICRO NUMBER:: 91114151
SPECIMEN QUALITY:: ADEQUATE

## 2018-02-05 ENCOUNTER — Other Ambulatory Visit: Payer: Self-pay | Admitting: Internal Medicine

## 2018-02-05 ENCOUNTER — Telehealth: Payer: Self-pay | Admitting: Internal Medicine

## 2018-02-05 LAB — POCT INR: INR: 2.1 — AB (ref 0.9–1.1)

## 2018-02-05 MED ORDER — CIPROFLOXACIN HCL 250 MG PO TABS: 250.0000 mg | ORAL_TABLET | Freq: Two times a day (BID) | ORAL | 0 refills | Status: DC

## 2018-02-05 NOTE — Telephone Encounter (Signed)
Copied from [place] [id number]. Topic: Quick Communication - Rx Refill/Question >> Feb 05, 2018 11:26 AM Burchel, Abbi R wrote: Medication: yeast infection treatment  Preferred Pharmacy: [facility], [facility] [place] [address] Phone: [phone] Fax: [phone]    Pt states that she needs to take something to treat/prevent a yeast infection while taking an abx for UTI.

## 2018-02-05 NOTE — Telephone Encounter (Signed)
rx request , does patient need to be seen for this?

## 2018-02-05 NOTE — Progress Notes (Signed)
UTI.  cipro sensitive,  rx sent to [facility]

## 2018-02-05 NOTE — Telephone Encounter (Signed)
Spoke with pt and advised her that she she should take a probiotic once daily or eat a cup of yogurt daily while she is taking the antibiotic to help prevent a yeast infection while taking the antibiotic. Also let the pt know that the probiotic will also help to prevent c. Diff. Told pt that if she has yeast infection after taking the antibiotic to please give our office call. Pt gave a verbal understanding.

## 2018-02-09 ENCOUNTER — Telehealth: Payer: Self-pay

## 2018-02-09 NOTE — Telephone Encounter (Signed)
INR result from 02/05/2018 is 2.1. Pt is currently taking 1mg  coumadin daily.

## 2018-02-09 NOTE — Telephone Encounter (Signed)
Coumadin level is therapeutic,  Continue current regimen and repeat PT/INR in one week 

## 2018-02-10 NOTE — Telephone Encounter (Signed)
Spoke with pt and informed her of her INR results and that she should continue her 1mg  daily and check INR again in one week. Pt gave a verbal understanding.

## 2018-02-12 ENCOUNTER — Telehealth: Payer: Self-pay | Admitting: Internal Medicine

## 2018-02-12 NOTE — Telephone Encounter (Signed)
Coumadin level is therapeutic,  Continue current regimen and repeat PT/INR in one week 

## 2018-02-12 NOTE — Telephone Encounter (Signed)
Santiago Glad, CSR from [facility] called to report the pt's INR was 1.9 on 02/12/18 at 1150; result repeated for verification; results given to [place], [place]; will also route to office for notification.

## 2018-02-12 NOTE — Telephone Encounter (Signed)
INR

## 2018-02-12 NOTE — Telephone Encounter (Signed)
INR is 1.9. Pt is taking 1mg  daily.

## 2018-02-19 LAB — POCT INR: INR: 2.5 — AB (ref 0.9–1.1)

## 2018-02-22 ENCOUNTER — Telehealth: Payer: Self-pay

## 2018-02-22 DIAGNOSIS — N390 Urinary tract infection, site not specified: Secondary | ICD-10-CM

## 2018-02-22 NOTE — Telephone Encounter (Signed)
Coumadin level is therapeutic,  Continue current regimen and repeat PT/INR in one week 

## 2018-02-22 NOTE — Telephone Encounter (Signed)
Pt's INR is 2.5. Taking 1mg  coumadin daily.

## 2018-02-23 NOTE — Telephone Encounter (Signed)
Spoke with pt and informed her of her INR results and that she needs to continue taking 1mg  daily and recheck her INR again in one week. Pt gave a verbal understanding.   Pt stated that she just finished an antibiotic for a UTI and is wondering if she needs to come back in to have her urine rechecked. The pt stated that her urine is still darker than usual at times and she has some slight itching at times. Should pt schedule an appt or drop off a urine?

## 2018-02-23 NOTE — Telephone Encounter (Signed)
She can drop off a urine and if there are no signs of infection,  We will schedule her for an office visi to give her a pelvic exam

## 2018-02-24 NOTE — Telephone Encounter (Signed)
Spoke with pt and she stated that she may not be able to drop the urine off until sometimes on Friday. Pt did state that she felt better today than she has in a while.

## 2018-02-26 NOTE — Telephone Encounter (Signed)
fyi

## 2018-02-26 NOTE — Telephone Encounter (Signed)
Copied from [place] [phone]. Topic: General - Other >> Feb 26, 2018 12:24 PM Burchel, Abbi R wrote:  Pt would like to notify office she will not be able to bring in her urine sample today.   [phone]

## 2018-02-27 DIAGNOSIS — Z7901 Long term (current) use of anticoagulants: Secondary | ICD-10-CM | POA: Diagnosis not present

## 2018-02-27 LAB — POCT INR: INR: 1.9 — AB (ref 0.9–1.1)

## 2018-03-01 ENCOUNTER — Telehealth: Payer: Self-pay | Admitting: Internal Medicine

## 2018-03-01 ENCOUNTER — Telehealth: Payer: Self-pay | Admitting: *Deleted

## 2018-03-01 ENCOUNTER — Telehealth: Payer: Self-pay

## 2018-03-01 ENCOUNTER — Other Ambulatory Visit (INDEPENDENT_AMBULATORY_CARE_PROVIDER_SITE_OTHER): Payer: Medicare Other

## 2018-03-01 DIAGNOSIS — N390 Urinary tract infection, site not specified: Secondary | ICD-10-CM

## 2018-03-01 LAB — PROTIME-INR: Protime: 1.9 — AB (ref 10.0–13.8)

## 2018-03-01 LAB — URINALYSIS, ROUTINE W REFLEX MICROSCOPIC
Bilirubin Urine: NEGATIVE
Ketones, ur: NEGATIVE
Nitrite: NEGATIVE
Specific Gravity, Urine: <=1.005 — AB (ref 1.000–1.030)
Total Protein, Urine: NEGATIVE
Urine Glucose: NEGATIVE
Urobilinogen, UA: 0.2 (ref 0.0–1.0)
pH: 6 (ref 5.0–8.0)

## 2018-03-01 NOTE — Telephone Encounter (Signed)
Copied from [place] [phone]. Topic: Quick Communication - See Telephone Encounter >> Mar 01, 2018 10:11 AM Blase Mess A wrote: CRM for notification. See Telephone encounter for: 03/01/18. Debbie with Inr Patient is calling to report  an out of range Inr [phone]

## 2018-03-01 NOTE — Telephone Encounter (Signed)
INR result is 1.9. Pt is currently taking 1mg  daily.

## 2018-03-01 NOTE — Telephone Encounter (Signed)
Please see previous message. Already sent to Dr. Derrel Nip.

## 2018-03-01 NOTE — Telephone Encounter (Signed)
Patient INR on 02/27/18 was 1.9 according to [facility].

## 2018-03-01 NOTE — Telephone Encounter (Signed)
Pt called in returning a call to Adair Laundry, CMA in the office for Dr. Derrel Nip at [facility].  I let pt know Coumadin level is therapeutic, continue current regimen and repeat PT/INR in one week.   inr abstracted.  No questions from pt.    She is awaiting a urine specimen report.   I let her know we would call her when the results are in from the urine specimen.

## 2018-03-01 NOTE — Telephone Encounter (Signed)
Per PEC message pt is aware of INR results and that she needs to continue her current dose. Pt was also advised that we would contact her as soon as we got her urine results back.

## 2018-03-01 NOTE — Telephone Encounter (Signed)
Coumadin level is therapeutic,  Continue current regimen and repeat PT/INR in one week.    Inr abstracted.

## 2018-03-01 NOTE — Telephone Encounter (Signed)
LMTCB. PEC may speak with pt.  

## 2018-03-02 LAB — URINE CULTURE
MICRO NUMBER:: 91232254
SPECIMEN QUALITY:: ADEQUATE

## 2018-03-05 ENCOUNTER — Telehealth: Payer: Self-pay | Admitting: Internal Medicine

## 2018-03-05 NOTE — Telephone Encounter (Signed)
See result note message 

## 2018-03-05 NOTE — Telephone Encounter (Deleted)
Copied from [place] [phone]. Topic: Quick Communication - Lab Results (Clinic Use ONLY) >> Mar 04, 2018  2:32 PM Adair Laundry, CMA wrote: Called patient to inform them of 03/03/2018 lab results. When patient returns call, triage nurse may disclose results.  PEC may speak with pt. >> Mar 05, 2018  9:28 AM Keene Breath wrote: Patient is returning call to receive her lab results.  NT was busy and patient did not want to hold any longer.  Patient would like a call back.  CB# [phone]

## 2018-03-05 NOTE — Telephone Encounter (Signed)
Copied from [place] [phone]. Topic: Quick Communication - Lab Results (Clinic Use ONLY) >> Mar 04, 2018  2:32 PM Adair Laundry, CMA wrote: Called patient to inform them of 03/03/2018 lab results. When patient returns call, triage nurse may disclose results.  PEC may speak with pt. >> Mar 05, 2018  9:28 AM Keene Breath wrote: Patient is returning call to receive her lab results.  NT was busy and patient did not want to hold any longer.  Patient would like a call back.  CB# [phone]

## 2018-03-05 NOTE — Telephone Encounter (Signed)
Attempted to call pt. Number busy will try again.

## 2018-03-06 LAB — POCT INR: INR: 2 — AB (ref 0.9–1.1)

## 2018-03-08 ENCOUNTER — Telehealth: Payer: Self-pay

## 2018-03-08 NOTE — Telephone Encounter (Signed)
INR result is 2.0. Pt is currently taking 1mg  daily. INR has been abstracted.

## 2018-03-08 NOTE — Telephone Encounter (Signed)
Coumadin level is therapeutic,  Continue current regimen and repeat PT/INR in one week 

## 2018-03-09 NOTE — Telephone Encounter (Signed)
Spoke with pt and informed her of her INR results and that she is to continue taking the 1mg  daily of coumadin. Pt gave a verbal understanding.

## 2018-03-10 DIAGNOSIS — Z23 Encounter for immunization: Secondary | ICD-10-CM | POA: Diagnosis not present

## 2018-03-15 ENCOUNTER — Telehealth: Payer: Self-pay

## 2018-03-15 LAB — POCT INR: INR: 1.9 — AB (ref 0.9–1.1)

## 2018-03-15 NOTE — Telephone Encounter (Signed)
Please see previous message

## 2018-03-15 NOTE — Telephone Encounter (Signed)
Copied from [place] [phone]. Topic: General - Other >> Mar 15, 2018 11:33 AM Judyann Munson wrote: Reason for CRM:  Kara [name]- with Inr Patient is calling to report  an out of range Inr  1.9 on 03-13-18. Best contact number [phone]

## 2018-03-15 NOTE — Telephone Encounter (Signed)
Coumadin level is therapeutic,  Continue current regimen and repeat PT/INR in one week 

## 2018-03-15 NOTE — Telephone Encounter (Signed)
INR result: 1.9. Pt is taking coumadin 1mg  daily. Result has been abstracted.

## 2018-03-16 NOTE — Telephone Encounter (Signed)
Patient advised of result and verbalized understanding.

## 2018-03-21 LAB — POCT INR: INR: 2 — AB (ref <=1.1)

## 2018-03-22 ENCOUNTER — Telehealth: Payer: Self-pay

## 2018-03-22 NOTE — Telephone Encounter (Signed)
Coumadin level is therapeutic,  Continue current regimen and repeat PT/INR in one week 

## 2018-03-22 NOTE — Telephone Encounter (Signed)
INR results: 2.0 Pt is currently taking 1mg  daily.   INR has been abstracted.

## 2018-03-22 NOTE — Telephone Encounter (Signed)
LMTCB. PEC may speak with pt.  

## 2018-03-23 DIAGNOSIS — I480 Paroxysmal atrial fibrillation: Secondary | ICD-10-CM | POA: Diagnosis not present

## 2018-03-23 DIAGNOSIS — I10 Essential (primary) hypertension: Secondary | ICD-10-CM | POA: Diagnosis not present

## 2018-03-23 DIAGNOSIS — I071 Rheumatic tricuspid insufficiency: Secondary | ICD-10-CM | POA: Diagnosis not present

## 2018-03-23 DIAGNOSIS — I2510 Atherosclerotic heart disease of native coronary artery without angina pectoris: Secondary | ICD-10-CM | POA: Diagnosis not present

## 2018-03-23 DIAGNOSIS — R0602 Shortness of breath: Secondary | ICD-10-CM | POA: Diagnosis not present

## 2018-03-23 DIAGNOSIS — R600 Localized edema: Secondary | ICD-10-CM | POA: Diagnosis not present

## 2018-03-23 NOTE — Telephone Encounter (Signed)
LMTCB. PEC may speak with pt.  

## 2018-03-23 NOTE — Telephone Encounter (Signed)
Pt. returned call.  Advised of Dr. Lupita Dawn recommendation to continue current Coumadin dose regimen, and repeat PT/ INR in one week.  Verb. Understanding.

## 2018-03-25 DIAGNOSIS — L821 Other seborrheic keratosis: Secondary | ICD-10-CM | POA: Diagnosis not present

## 2018-03-25 DIAGNOSIS — Z08 Encounter for follow-up examination after completed treatment for malignant neoplasm: Secondary | ICD-10-CM | POA: Diagnosis not present

## 2018-03-25 DIAGNOSIS — L218 Other seborrheic dermatitis: Secondary | ICD-10-CM | POA: Diagnosis not present

## 2018-03-25 DIAGNOSIS — Z85828 Personal history of other malignant neoplasm of skin: Secondary | ICD-10-CM | POA: Diagnosis not present

## 2018-03-30 DIAGNOSIS — Z7901 Long term (current) use of anticoagulants: Secondary | ICD-10-CM | POA: Diagnosis not present

## 2018-03-30 LAB — PROTIME-INR

## 2018-03-31 ENCOUNTER — Telehealth: Payer: Self-pay

## 2018-03-31 ENCOUNTER — Other Ambulatory Visit: Payer: Self-pay | Admitting: Internal Medicine

## 2018-03-31 DIAGNOSIS — Z7901 Long term (current) use of anticoagulants: Secondary | ICD-10-CM

## 2018-03-31 DIAGNOSIS — K529 Noninfective gastroenteritis and colitis, unspecified: Secondary | ICD-10-CM

## 2018-03-31 NOTE — Telephone Encounter (Signed)
Coumadin level is therapeutic,  Continue current regimen and repeat PT/INR in one week 

## 2018-03-31 NOTE — Telephone Encounter (Signed)
INR is 2.0. Pt is currently taking 1mg  coumadin daily.

## 2018-03-31 NOTE — Telephone Encounter (Signed)
Spoke with pt and informed her of her INR results and that she needs to continue taking the 1 mg daily and to rechk again in one week. Pt gave a verbal understanding.

## 2018-03-31 NOTE — Assessment & Plan Note (Signed)
INR is 2.0 on 1 mg daily.  This  is therapeutic,  Continue current regimen and repeat PT/INR in one week

## 2018-04-01 ENCOUNTER — Other Ambulatory Visit: Payer: Self-pay | Admitting: Internal Medicine

## 2018-04-01 DIAGNOSIS — I4891 Unspecified atrial fibrillation: Secondary | ICD-10-CM

## 2018-04-01 NOTE — Telephone Encounter (Signed)
Last OV 02/02/2018   Last refilled 09/18/2017 disp 60 with no refills   Sent to PCP for approval

## 2018-04-01 NOTE — Telephone Encounter (Signed)
Last OV 02/02/2018   Last refilled 11/02/2017 disp 30 with 2 refills   Sent to PCP for approval

## 2018-04-02 ENCOUNTER — Other Ambulatory Visit: Payer: Self-pay

## 2018-04-02 ENCOUNTER — Other Ambulatory Visit: Payer: Self-pay | Admitting: Internal Medicine

## 2018-04-02 DIAGNOSIS — K529 Noninfective gastroenteritis and colitis, unspecified: Secondary | ICD-10-CM

## 2018-04-02 MED ORDER — DIPHENOXYLATE-ATROPINE 2.5-0.025 MG PO TABS: ORAL_TABLET | ORAL | 0 refills | Status: DC

## 2018-04-02 NOTE — Telephone Encounter (Signed)
See previous message

## 2018-04-07 ENCOUNTER — Telehealth: Payer: Self-pay | Admitting: Internal Medicine

## 2018-04-07 LAB — POCT INR: INR: 1.7 — AB (ref 0.9–1.1)

## 2018-04-07 NOTE — Telephone Encounter (Signed)
Coumadin level is reviewed,  Continue current regimen and repeat PT/INR in one week

## 2018-04-07 NOTE — Telephone Encounter (Signed)
Denna from [facility] called with a lab value of an INR of 1.7 today for the patient. Will notify flow at [facility] at [facility].

## 2018-04-07 NOTE — Telephone Encounter (Signed)
Patient voiced understanding to result.

## 2018-04-07 NOTE — Telephone Encounter (Signed)
Patient taking 1 mg daily coumadin with INR reported as 1.7 from [facility].

## 2018-04-08 DIAGNOSIS — I480 Paroxysmal atrial fibrillation: Secondary | ICD-10-CM | POA: Diagnosis not present

## 2018-04-09 ENCOUNTER — Telehealth: Payer: Self-pay

## 2018-04-09 NOTE — Telephone Encounter (Signed)
Patient is returning call to [facility] regarding INR results.  Please call back at [phone]

## 2018-04-09 NOTE — Telephone Encounter (Signed)
Coumadin level is therapeutic,  Continue current regimen and repeat PT/INR in one week 

## 2018-04-09 NOTE — Telephone Encounter (Signed)
INR is 1.7. Pt is taking 1mg  coumadin daily.

## 2018-04-09 NOTE — Telephone Encounter (Signed)
Spoke with pt and informed her of her INR results and that she needs to continue taking 1mg  coumadin daily. Pt gave a verbal understanding.

## 2018-04-12 ENCOUNTER — Encounter: Payer: Self-pay | Admitting: Internal Medicine

## 2018-04-12 ENCOUNTER — Ambulatory Visit (INDEPENDENT_AMBULATORY_CARE_PROVIDER_SITE_OTHER): Payer: Medicare Other | Admitting: Internal Medicine

## 2018-04-12 VITALS — BP 132/64 | HR 88 | Temp 98.3°F | Resp 15 | Ht 62.0 in | Wt 136.4 lb

## 2018-04-12 DIAGNOSIS — E538 Deficiency of other specified B group vitamins: Secondary | ICD-10-CM

## 2018-04-12 DIAGNOSIS — R0609 Other forms of dyspnea: Secondary | ICD-10-CM

## 2018-04-12 DIAGNOSIS — Z7901 Long term (current) use of anticoagulants: Secondary | ICD-10-CM

## 2018-04-12 DIAGNOSIS — I4891 Unspecified atrial fibrillation: Secondary | ICD-10-CM

## 2018-04-12 DIAGNOSIS — R0600 Dyspnea, unspecified: Secondary | ICD-10-CM

## 2018-04-12 DIAGNOSIS — R600 Localized edema: Secondary | ICD-10-CM | POA: Diagnosis not present

## 2018-04-12 DIAGNOSIS — E1140 Type 2 diabetes mellitus with diabetic neuropathy, unspecified: Secondary | ICD-10-CM

## 2018-04-12 DIAGNOSIS — R5383 Other fatigue: Secondary | ICD-10-CM

## 2018-04-12 LAB — CBC WITH DIFFERENTIAL/PLATELET
Basophils Absolute: 0 10*3/uL (ref 0.0–0.1)
Basophils Relative: 0.4 % (ref 0.0–3.0)
Eosinophils Absolute: 0.1 10*3/uL (ref 0.0–0.7)
Eosinophils Relative: 0.7 % (ref 0.0–5.0)
HCT: 40.9 % (ref 36.0–46.0)
Hemoglobin: 13.7 g/dL (ref 12.0–15.0)
Lymphocytes Relative: 35.8 % (ref 12.0–46.0)
Lymphs Abs: 2.8 10*3/uL (ref 0.7–4.0)
MCHC: 33.5 g/dL (ref 30.0–36.0)
MCV: 91.7 fl (ref 78.0–100.0)
Monocytes Absolute: 0.6 10*3/uL (ref 0.1–1.0)
Monocytes Relative: 7 % (ref 3.0–12.0)
Neutro Abs: 4.4 10*3/uL (ref 1.4–7.7)
Neutrophils Relative %: 56.1 % (ref 43.0–77.0)
Platelets: 179 10*3/uL (ref 150.0–400.0)
RBC: 4.47 Mil/uL (ref 3.87–5.11)
RDW: 12.9 % (ref 11.5–15.5)
WBC: 7.9 10*3/uL (ref 4.0–10.5)

## 2018-04-12 LAB — COMPREHENSIVE METABOLIC PANEL
ALT: 14 U/L (ref 0–35)
AST: 17 U/L (ref 0–37)
Albumin: 3.7 g/dL (ref 3.5–5.2)
Alkaline Phosphatase: 61 U/L (ref 39–117)
BUN: 21 mg/dL (ref 6–23)
CO2: 28 mEq/L (ref 19–32)
Calcium: 8.9 mg/dL (ref 8.4–10.5)
Chloride: 104 mEq/L (ref 96–112)
Creatinine, Ser: 0.89 mg/dL (ref 0.40–1.20)
GFR: 62.83 mL/min (ref >60.00)
Glucose, Bld: 174 mg/dL — ABNORMAL HIGH (ref 70–99)
Potassium: 3.4 mEq/L — ABNORMAL LOW (ref 3.5–5.1)
Sodium: 140 mEq/L (ref 135–145)
Total Bilirubin: 0.9 mg/dL (ref 0.2–1.2)
Total Protein: 6.5 g/dL (ref 6.0–8.3)

## 2018-04-12 LAB — HEMOGLOBIN A1C: Hgb A1c MFr Bld: 6.6 % — ABNORMAL HIGH (ref 4.6–6.5)

## 2018-04-12 LAB — MICROALBUMIN / CREATININE URINE RATIO
Creatinine,U: 75.3 mg/dL
Microalb Creat Ratio: 1.3 mg/g (ref 0.0–30.0)
Microalb, Ur: 0.9 mg/dL (ref 0.0–1.9)

## 2018-04-12 LAB — VITAMIN B12: Vitamin B-12: 284 pg/mL (ref 211–911)

## 2018-04-12 NOTE — Patient Instructions (Addendum)
Your racing heart might be occurring at night due to low oxygen levels .  If it continues to occur,  We will need to try adding supplemental oxygen at night   You can add up to 2000 mg of acetominophen (tylenol) every day safely  In divided doses (500 mg every 6 hours  Or 1000 mg every 12 hours.)  Dr Lucky Cowboy recommends that you try the zippered compression stockings to manage your swollen leg due to venous insufficiency.  If you can find [employer] brand ,  They are excellent   You can also try the [email or web address] website : they may have a few choices in the zippered variety

## 2018-04-12 NOTE — Progress Notes (Signed)
Subjective:  Patient ID: Kara [name], female    DOB: [birth year]  Age: 84 y.o. MRN: [id number]  CC: The primary encounter diagnosis was Type 2 diabetes mellitus with diabetic neuropathy, without long-term current use of insulin ([place]). Diagnoses of Anticoagulant long-term use, B12 deficiency, Atrial fibrillation, unspecified type ([place]), Edema of lower extremity, Exertional dyspnea, and Other fatigue were also pertinent to this visit.  HPI Kara [name] presents for follow up on T2DM with CAD, atrial fib managed with diltiazem and chronic anticoagulation managed with  1 mg daily dose of coumadin, s/p pacemaker , pernicious anemia,  And hypertension   Woke up with a racing heart last night.  First time in a long time.  She has a prior diagnosis of OSA,  Tried to tolerate CPAP  For 3 months but could not tolerate it.   Falling asleep easily during the day .  Stays up Up till 11:30 and 12:49midnight  Wakes  up at 8 to 9  Am,  feels off balance  in the morning but denies any recent falls   Due for B12 injection at [facility] for this month    Outpatient Medications Prior to Visit  Medication Sig Dispense Refill  . cholecalciferol (VITAMIN D) 1000 UNITS tablet Take 1,000 Units by mouth daily.    . cyanocobalamin (,VITAMIN B-12,) 1000 MCG/ML injection INJECT 1 ML  INTO THE MUSCLE EVERY 30 DAYS. 10 mL 0  . diltiazem (CARTIA XT) 180 MG 24 hr capsule Take 1 capsule (180 mg total) by mouth every morning. 90 capsule 1  . diphenoxylate-atropine (LOMOTIL) 2.5-0.025 MG tablet TAKE ONE TABLET BY MOUTH TWICE A DAY AS NEEDED FOR DIARRHEA. 60 tablet 0  . estradiol (ESTRACE) 0.1 MG/GM vaginal cream Place 2 g vaginally at bedtime. For 2 weeks,  Then reduce dose to 1 gram every 3-4 days 42.5 g 11  . hydrocortisone-pramoxine (ANALPRAM-HC) 2.5-1 % rectal cream APPLY RECTALLY THREE TIMES DAILY 30 g 0  . Hypromellose (GENTEAL) 0.3 % SOLN Place 1 drop into both eyes daily as needed (dryness).     Marland Kitchen ketoconazole  (NIZORAL) 2 % cream Apply 1 application topically daily as needed.   1  . metoprolol tartrate (LOPRESSOR) 50 MG tablet TAKE 1/2 TABLET BY MOUTH EVERY MORNING AND TAKE 1/2 TABLET BY MOUTH EVERY EVENING 30 tablet 3  . ondansetron (ZOFRAN) 4 MG tablet Take 1 tablet (4 mg total) by mouth every 8 (eight) hours as needed for nausea or vomiting. 30 tablet 0  . triamcinolone cream (KENALOG) 0.1 % Apply 1 application topically 2 (two) times daily. (Patient taking differently: Apply 1 application topically 2 (two) times daily as needed. ) 30 g 0  . warfarin (COUMADIN) 1 MG tablet TAKE ONE TABLET BY MOUTH DAILY AT 6PM 30 tablet 2  . warfarin (COUMADIN) 1 MG tablet TAKE ONE TABLET BY MOUTH DAILY AT 6PM 90 tablet 1  . ciprofloxacin (CIPRO) 250 MG tablet Take 1 tablet (250 mg total) by mouth 2 (two) times daily. (Patient not taking: Reported on 04/12/2018) 6 tablet 0  . fluconazole (DIFLUCAN) 150 MG tablet Take 1 tablet (150mg ) by mouth on day 1, take the second by mouth tablet 3 days later. (Patient not taking: Reported on 04/12/2018) 2 tablet 0   No facility-administered medications prior to visit.     Review of Systems;  Patient denies headache, fevers, malaise, unintentional weight loss, skin rash, eye pain, sinus congestion and sinus pain, sore throat, dysphagia,  hemoptysis , cough, dyspnea,  wheezing, chest pain, palpitations, orthopnea, edema, abdominal pain, nausea, melena, diarrhea, constipation, flank pain, dysuria, hematuria, urinary  Frequency, nocturia, numbness, tingling, seizures,  Focal weakness, Loss of consciousness,  Tremor, insomnia, depression, anxiety, and suicidal ideation.      Objective:  BP 132/64 (BP Location: Left Arm, Patient Position: Sitting, Cuff Size: Normal)   Pulse 88   Temp 98.3 F (36.8 C) (Oral)   Resp 15   Ht 5\' 2"  (1.575 m)   Wt 136 lb 6.4 oz (61.9 kg)   SpO2 92%   BMI 24.95 kg/m   BP Readings from Last 3 Encounters:  04/12/18 132/64  02/02/18 (!) 114/56    12/18/17 140/68    Wt Readings from Last 3 Encounters:  04/12/18 136 lb 6.4 oz (61.9 kg)  02/02/18 137 lb 3.2 oz (62.2 kg)  12/18/17 134 lb 8 oz (61 kg)    General appearance: alert, cooperative and appears stated age Ears: normal TM's and external ear canals both ears Throat: lips, mucosa, and tongue normal; teeth and gums normal Neck: no adenopathy, no carotid bruit, supple, symmetrical, trachea midline and thyroid not enlarged, symmetric, no tenderness/mass/nodules Back: symmetric, no curvature. ROM normal. No CVA tenderness. Lungs: clear to auscultation bilaterally Heart: regular rate and rhythm, S1, S2 normal, no murmur, click, rub or gallop Abdomen: soft, non-tender; bowel sounds normal; no masses,  no organomegaly Pulses: 2+ and symmetric Skin: Skin color, texture, turgor normal. No rashes or lesions Lymph nodes: Cervical, supraclavicular, and axillary nodes normal.  Lab Results  Component Value Date   HGBA1C 6.6 (H) 04/12/2018   HGBA1C 6.6 (H) 10/06/2017   HGBA1C 6.7 (H) 04/07/2017    Lab Results  Component Value Date   CREATININE 0.89 04/12/2018   CREATININE 0.79 10/06/2017   CREATININE 0.91 05/15/2017    Lab Results  Component Value Date   WBC 7.9 04/12/2018   HGB 13.7 04/12/2018   HCT 40.9 04/12/2018   PLT 179.0 04/12/2018   GLUCOSE 174 (H) 04/12/2018   CHOL 142 10/11/2013   TRIG 194.0 (H) 10/11/2013   HDL 43.10 10/11/2013   LDLDIRECT 86.3 06/30/2012   LDLCALC 60 10/11/2013   ALT 14 04/12/2018   AST 17 04/12/2018   NA 140 04/12/2018   K 3.4 (L) 04/12/2018   CL 104 04/12/2018   CREATININE 0.89 04/12/2018   BUN 21 04/12/2018   CO2 28 04/12/2018   TSH 3.35 04/07/2017   INR 1.7 (A) 04/07/2018   HGBA1C 6.6 (H) 04/12/2018   MICROALBUR 0.9 04/12/2018    Dg Chest 2 View  Result Date: 05/15/2017 CLINICAL DATA:  Shortness of Breath EXAM: CHEST  2 VIEW COMPARISON:  May 22, 2014 FINDINGS: There is no edema or consolidation. Heart size and pulmonary  vascularity are normal. Pacemaker leads are attached to the right atrium right ventricle. There is aortic atherosclerosis. No adenopathy. There is evidence of an old healed fracture of the proximal left humerus. IMPRESSION: No edema or consolidation. Aortic atherosclerosis. Old fracture proximal left humerus. Aortic Atherosclerosis (ICD10-I70.0). Electronically Signed   By: Lowella Grip III M.D.   On: 05/15/2017 10:58   Ct Angio Chest Pe W And/or Wo Contrast  Result Date: 05/15/2017 CLINICAL DATA:  Acute onset of severe shortness of breath this morning. Worsening shortness of breath when lying flat. History of uterine and skin cancer and mitral valve regurgitation. Question pulmonary embolism. EXAM: CT ANGIOGRAPHY CHEST WITH CONTRAST TECHNIQUE: Multidetector CT imaging of the chest was performed using the standard protocol during bolus administration  of intravenous contrast. Multiplanar CT image reconstructions and MIPs were obtained to evaluate the vascular anatomy. CONTRAST:  22mL ISOVUE-370 IOPAMIDOL (ISOVUE-370) INJECTION 76% COMPARISON:  Radiographs 05/15/2017. FINDINGS: Cardiovascular: The pulmonary arteries are well opacified with contrast to the level of the subsegmental branches. There is no evidence of acute pulmonary embolism. There is limited opacification of the systemic vessels. Mild atherosclerosis of the aorta, great vessels and coronary arteries. Left subclavian pacemaker leads appear unchanged in the right atrium and right ventricle. Mild right chamber cardiac enlargement, likely chronic. No pericardial effusion. Mediastinum/Nodes: There are no enlarged mediastinal, hilar or axillary lymph nodes.There are small mediastinal lymph nodes. The thyroid gland, trachea and esophagus demonstrate no significant findings. Lungs/Pleura: No pleural effusion or pneumothorax. The lungs are clear. Upper abdomen: The visualized upper abdomen appears unremarkable aside from vascular calcifications.  Musculoskeletal/Chest wall: There is no chest wall mass or suspicious osseous finding. Mild degenerative changes in the spine. Review of the MIP images confirms the above findings. IMPRESSION: 1. No evidence of acute pulmonary embolism or other acute chest process. 2. Coronary and Aortic Atherosclerosis (ICD10-I70.0). Mild right chamber cardiac enlargement post pacemaker. Electronically Signed   By: Richardean Sale M.D.   On: 05/15/2017 12:07    Assessment & Plan:   Problem List Items Addressed This Visit    Anticoagulant long-term use    Given her age,  I have elected to continue 1 mg daily due to her recurrent fluctuations into the suprtherpeutic range with dose adjusments.  Lab Results  Component Value Date   INR 1.7 (A) 04/07/2018   INR 2.0 (A) 03/21/2018   INR 1.9 (A) 03/15/2018   PROTIME 1.9 (A) 03/01/2018   PROTIME 1.6 (A) 12/30/2016   PROTIME 2.4 (A) 04/18/2015         Relevant Orders   CBC with Differential/Platelet (Completed)   Atrial fibrillation (HCC) (Chronic)    Managed with diltiazem  No doubt aggravated by untreated OSA.  Recent nocturnal episode reported;  Advised to report future episodes and nocturnal pulse ox study will needed to see if oxygen supplementation is warranted.       Edema of lower extremity    Suggested  by exam and history.  Records requested.  Pathophysiology explained and Proper use of compression stockings described.      Exertional dyspnea    Secondary to deconditioning.  She has no history of heart failure or PAD. Encouraged to resume daily walking       Fatigue    chronic, likely multifactorial , including untreated sleep apnea (scondary to mask intolerance) , atrial fibrillation,  b12 deficiency (now treated with monthly parenteral injections ad [place]) ,  And age.  Nothing reversible   Lab Results  Component Value Date   URKYHCWC37 628 04/12/2018   Lab Results  Component Value Date   TSH 3.35 04/07/2017   Lab Results    Component Value Date   WBC 7.9 04/12/2018   HGB 13.7 04/12/2018   HCT 40.9 04/12/2018   MCV 91.7 04/12/2018   PLT 179.0 04/12/2018         Type 2 diabetes mellitus with diabetic neuropathy, unspecified ([place]) - Primary    Improved control with less dietary indulgences.   Given her age,  No treatment unless a1c is > 7.5  Lab Results  Component Value Date   HGBA1C 6.6 (H) 04/12/2018   Lab Results  Component Value Date   MICROALBUR 0.9 04/12/2018         Relevant  Orders   Hemoglobin A1c (Completed)   Comprehensive metabolic panel (Completed)   Microalbumin / creatinine urine ratio (Completed)    Other Visit Diagnoses    B12 deficiency       Relevant Orders   Vitamin B12 (Completed)    A total of 25 minutes of face to face time was spent with patient more than half of which was spent in counselling about the above mentioned conditions  and coordination of care   I have discontinued Daleen Squibb. [place]'s fluconazole and ciprofloxacin. I am also having her maintain her cholecalciferol, Hypromellose, hydrocortisone-pramoxine, triamcinolone cream, ondansetron, ketoconazole, metoprolol tartrate, warfarin, cyanocobalamin, diltiazem, estradiol, warfarin, and diphenoxylate-atropine.  No orders of the defined types were placed in this encounter.   Medications Discontinued During This Encounter  Medication Reason  . ciprofloxacin (CIPRO) 250 MG tablet Completed Course  . fluconazole (DIFLUCAN) 150 MG tablet Completed Course    Follow-up: Return in about 6 months (around 10/11/2018).   Crecencio Mc, MD

## 2018-04-13 NOTE — Assessment & Plan Note (Signed)
Improved control with less dietary indulgences.   Given her age,  No treatment unless a1c is > 7.5  Lab Results  Component Value Date   HGBA1C 6.6 (H) 04/12/2018   Lab Results  Component Value Date   MICROALBUR 0.9 04/12/2018

## 2018-04-13 NOTE — Assessment & Plan Note (Signed)
Suggested  by exam and history.  Records requested.  Pathophysiology explained and Proper use of compression stockings described.

## 2018-04-13 NOTE — Assessment & Plan Note (Signed)
chronic, likely multifactorial , including untreated sleep apnea (scondary to mask intolerance) , atrial fibrillation,  b12 deficiency (now treated with monthly parenteral injections ad [place]) ,  And age.  Nothing reversible   Lab Results  Component Value Date   VITAMINB12 284 04/12/2018   Lab Results  Component Value Date   TSH 3.35 04/07/2017   Lab Results  Component Value Date   WBC 7.9 04/12/2018   HGB 13.7 04/12/2018   HCT 40.9 04/12/2018   MCV 91.7 04/12/2018   PLT 179.0 04/12/2018

## 2018-04-13 NOTE — Assessment & Plan Note (Signed)
Given her age,  I have elected to continue 1 mg daily due to her recurrent fluctuations into the suprtherpeutic range with dose adjusments.  Lab Results  Component Value Date   INR 1.7 (A) 04/07/2018   INR 2.0 (A) 03/21/2018   INR 1.9 (A) 03/15/2018   PROTIME 1.9 (A) 03/01/2018   PROTIME 1.6 (A) 12/30/2016   PROTIME 2.4 (A) 04/18/2015

## 2018-04-13 NOTE — Assessment & Plan Note (Signed)
Secondary to deconditioning.  She has no history of heart failure or PAD. Encouraged to resume daily walking

## 2018-04-13 NOTE — Assessment & Plan Note (Addendum)
Managed with diltiazem  No doubt aggravated by untreated OSA.  Recent nocturnal episode reported;  Advised to report future episodes and nocturnal pulse ox study will needed to see if oxygen supplementation is warranted.

## 2018-04-14 ENCOUNTER — Telehealth: Payer: Self-pay

## 2018-04-14 ENCOUNTER — Telehealth: Payer: Self-pay | Admitting: Internal Medicine

## 2018-04-14 LAB — POCT INR: INR: 1.7 — AB (ref <=1.1)

## 2018-04-14 MED ORDER — CYANOCOBALAMIN 1000 MCG/ML IJ SOLN: INTRAMUSCULAR | 0 refills | Status: DC

## 2018-04-14 NOTE — Telephone Encounter (Signed)
'  Kara [name]' with  '[facility]'  calling to report pt's INR this evening 1.7.

## 2018-04-19 MED ORDER — CYANOCOBALAMIN 1000 MCG/ML IJ SOLN: INTRAMUSCULAR | 0 refills | Status: DC

## 2018-04-19 NOTE — Addendum Note (Signed)
Addended by: Adair Laundry on: 04/19/2018 05:22 PM   Modules accepted: Orders

## 2018-04-19 NOTE — Telephone Encounter (Signed)
LMTCB. PEC may speak with pt.  

## 2018-04-19 NOTE — Telephone Encounter (Signed)
This is an INR result from 11/27/72019: 1.7. Pt is currently taking 1mg  daily.

## 2018-04-19 NOTE — Telephone Encounter (Signed)
Please see note from 04/09/2018 in regards to INR result.

## 2018-04-19 NOTE — Telephone Encounter (Signed)
Relation to pt: self Call back number: [phone]  Pharmacy: [facility], [place] [phone] (Phone) [phone] (Fax)     Reason for call:  Patient states frequency changed regarding her cyanocobalamin (,VITAMIN B-12,) 1000 MCG/ML injection from 1x month to 2x month and pharmacy is requesting new Rx, please advise

## 2018-04-19 NOTE — Telephone Encounter (Signed)
Medication has been corrected and sent to pharmacy.

## 2018-04-19 NOTE — Telephone Encounter (Signed)
Coumadin level is therapeutic,  Continue current regimen and repeat PT/INR in one week 

## 2018-04-20 NOTE — Telephone Encounter (Signed)
Pt called and stated that she would like a call back. Please advise [phone]

## 2018-04-20 NOTE — Telephone Encounter (Signed)
LMTCB. PEC may speak with pt.  

## 2018-04-20 NOTE — Telephone Encounter (Signed)
Spoke with pt and informed her of her INR results. Pt gave a verbal understanding.  

## 2018-04-21 LAB — POCT INR: INR: 1.9 — AB (ref <=1.1)

## 2018-04-22 ENCOUNTER — Telehealth: Payer: Self-pay | Admitting: Internal Medicine

## 2018-04-22 ENCOUNTER — Telehealth: Payer: Self-pay

## 2018-04-22 NOTE — Telephone Encounter (Signed)
FYI

## 2018-04-22 NOTE — Telephone Encounter (Signed)
Coumadin level is therapeutic,  Continue current regimen and repeat PT/INR in one week 

## 2018-04-22 NOTE — Telephone Encounter (Signed)
I called & informed patient that results were therapeutic. Patient will continue current regimen.

## 2018-04-22 NOTE — Telephone Encounter (Signed)
Malachy Mood, CSR from [facility] called to report the pt's INR 1.9 from 04/21/18 at [place]; this was a home test per pt; values repeated for correctness; notified Sarah at [facility]; the pt is seen by Dr Deborra Medina, [facility]; will also route to office for notification.

## 2018-04-22 NOTE — Telephone Encounter (Signed)
INR is 1.9. Pt is currently taking 1mg  daily.

## 2018-04-28 DIAGNOSIS — Z7901 Long term (current) use of anticoagulants: Secondary | ICD-10-CM | POA: Diagnosis not present

## 2018-04-28 LAB — POCT INR: INR: 1.9 — AB (ref <=1.1)

## 2018-04-29 ENCOUNTER — Telehealth: Payer: Self-pay | Admitting: Internal Medicine

## 2018-04-29 ENCOUNTER — Other Ambulatory Visit: Payer: Self-pay

## 2018-04-29 NOTE — Telephone Encounter (Signed)
Spoke with pt and informed her that she will need to take both the dose she missed yesterday and her dose for today equaling 2mg  total for today only. Pt gave a verbal understanding.

## 2018-04-29 NOTE — Telephone Encounter (Signed)
Take both doses today of coumadin

## 2018-04-29 NOTE — Telephone Encounter (Signed)
Coumadin level is therapeutic,  Continue current regimen and repeat PT/INR in one week 

## 2018-04-29 NOTE — Telephone Encounter (Signed)
Please advise 

## 2018-04-29 NOTE — Telephone Encounter (Signed)
Milana Na., Customer Service Rep with [facility] called the report of INR 1.9 reported on 04/28/18. She says hard copy faxed to [phone]. Result read back and verified.

## 2018-04-29 NOTE — Telephone Encounter (Signed)
Spoke with pt and she stated that she went our with her children last night and forgot to take her coumadin. Pt is wanting to know if she needs to increase her dose tonight or just keep taking the 1mg .

## 2018-04-29 NOTE — Telephone Encounter (Signed)
INR result dated 04/28/2018 is 1.9. Pt is taking 1mg  daily. Lab has been abstracted.

## 2018-05-05 LAB — POCT INR: INR: 1.9 — AB (ref 0.9–1.1)

## 2018-05-06 ENCOUNTER — Telehealth: Payer: Self-pay

## 2018-05-06 NOTE — Telephone Encounter (Signed)
INR result form 05/05/2018 is 1.9. Pt is taking 1mg  daily. Result has been abstracted.

## 2018-05-07 NOTE — Telephone Encounter (Signed)
Left a detailed message with pt letting her know of her INR results and that she needs to continue her current dose at 1mg  daily and repeat INR again in one week.

## 2018-05-07 NOTE — Telephone Encounter (Signed)
Coumadin level is therapeutic,  Continue current regimen and repeat PT/INR in one week 

## 2018-05-08 ENCOUNTER — Other Ambulatory Visit: Payer: Self-pay | Admitting: Internal Medicine

## 2018-05-08 DIAGNOSIS — K529 Noninfective gastroenteritis and colitis, unspecified: Secondary | ICD-10-CM

## 2018-05-12 LAB — POCT INR: INR: 1.8 — AB (ref 0.9–1.1)

## 2018-05-13 ENCOUNTER — Telehealth: Payer: Self-pay | Admitting: Internal Medicine

## 2018-05-13 NOTE — Telephone Encounter (Signed)
Sherise with [facility] calling to report that the pt had an INR level of 1.8 on 05/12/18.

## 2018-05-13 NOTE — Telephone Encounter (Signed)
Pt takes 1mg  daily.

## 2018-05-13 NOTE — Telephone Encounter (Signed)
I would advise that she take 1.5 mg today (1.5 tablets) and then resume 1 mg daily and recheck in one week. Based on review of her chart it appears that there has been concern with her INR going suprathrapeutic with dose changes so I advised small dose change as outlined above.

## 2018-05-13 NOTE — Telephone Encounter (Signed)
Please contact the patient and confirm her current regimen. Then I can give recommendations.

## 2018-05-13 NOTE — Telephone Encounter (Signed)
Spoke with pt and advised her that she needs to take 1.5mg  tonight and then resume the 1mg  daily tomorrow. Then repeat her INR in one week. Pt gave a verbal understanding.

## 2018-05-20 LAB — POCT INR: INR: 1.8 — AB (ref 0.9–1.1)

## 2018-05-25 ENCOUNTER — Telehealth: Payer: Self-pay

## 2018-05-25 ENCOUNTER — Other Ambulatory Visit: Payer: Self-pay

## 2018-05-25 NOTE — Telephone Encounter (Signed)
Coumadin level is therapeutic,  Continue current regimen and repeat PT/INR in one week 

## 2018-05-25 NOTE — Telephone Encounter (Signed)
INR from 05/20/2018 was 1.8. Pt is currently taking 1mg  daily.

## 2018-05-25 NOTE — Progress Notes (Signed)
INR from 05/20/2018 is 1.8. Pt is currently taking 1mg  daily.

## 2018-05-27 DIAGNOSIS — Z7901 Long term (current) use of anticoagulants: Secondary | ICD-10-CM | POA: Diagnosis not present

## 2018-05-27 LAB — POCT INR: INR: 2 — AB (ref 0.9–1.1)

## 2018-05-27 NOTE — Telephone Encounter (Signed)
There is no scientific evidence that elderberry does anything,  If she wants to take it that's fine. Whatever form she wishes

## 2018-05-27 NOTE — Telephone Encounter (Signed)
Spoke with pt and informed her of the INR results and that she is to continue taking the 1mg  daily and recheck INR in one week. Pt gave a verbal understanding. Pt is wanting to know if it is okay for her to take Elderberry during the winter and flu season? If so she is wanting to know if the pills, syrup, gummies or some other form is better than the other.

## 2018-05-27 NOTE — Telephone Encounter (Signed)
Left detailed message explaining Dr. Lupita Dawn message below per pt request.

## 2018-06-02 ENCOUNTER — Telehealth: Payer: Self-pay

## 2018-06-02 NOTE — Telephone Encounter (Signed)
INR results from 05/27/2018 is 2.0. Pt is currently taking 1mg  daily.

## 2018-06-02 NOTE — Telephone Encounter (Signed)
LMTCB. PEC may speak with pt.  

## 2018-06-02 NOTE — Telephone Encounter (Signed)
Coumadin level is therapeutic,  Continue current regimen and repeat PT/INR in one week 

## 2018-06-03 NOTE — Telephone Encounter (Signed)
Received another INR result dated 06/02/2018 of 2.0. Pt is currently taking 1mg  daily.

## 2018-06-03 NOTE — Telephone Encounter (Signed)
Coumadin level is therapeutic,  Continue current regimen and repeat PT/INR in one week 

## 2018-06-03 NOTE — Telephone Encounter (Signed)
LMTCB. PEC may speak with pt.  

## 2018-06-04 NOTE — Telephone Encounter (Signed)
Spoke with pt and informed her of her INR results. Pt gave a verbal understanding.  

## 2018-06-10 ENCOUNTER — Telehealth: Payer: Self-pay | Admitting: Internal Medicine

## 2018-06-10 ENCOUNTER — Other Ambulatory Visit: Payer: Self-pay | Admitting: Family

## 2018-06-10 LAB — POCT INR: INR: 1.5 — AB (ref 0.9–1.1)

## 2018-06-10 NOTE — Telephone Encounter (Signed)
Left detailed message for patient letting her know to continue same dose

## 2018-06-10 NOTE — Telephone Encounter (Signed)
CONTINUE CURRENT DAILY DOSE

## 2018-06-10 NOTE — Telephone Encounter (Signed)
Pt. called her INR today of 1.5.  Has been taking Warfarin 1 mg daily.  Her last dose was taken at approx. 6:00 PM last evening.  Will send results to PCP, and advised to expect a call with further recommendations.  Verb. Understanding.

## 2018-06-11 ENCOUNTER — Other Ambulatory Visit: Payer: Self-pay

## 2018-06-17 LAB — POCT INR: INR: 2 — AB (ref 0.9–1.1)

## 2018-06-18 ENCOUNTER — Telehealth: Payer: Self-pay

## 2018-06-18 NOTE — Telephone Encounter (Signed)
Coumadin level is therapeutic,  Continue current regimen and repeat PT/INR in one week 

## 2018-06-18 NOTE — Telephone Encounter (Signed)
INR from 06/17/2018 is 2.0. Pt is currently taking 1mg  daily.

## 2018-06-18 NOTE — Telephone Encounter (Signed)
LMTCB. PEC may speak with pt.  

## 2018-06-22 NOTE — Telephone Encounter (Signed)
Spoke with pt and informed her that her INR level was therapeutic and that she should continue taking the 1mg  daily. Pt gave a verbal understanding.

## 2018-06-24 DIAGNOSIS — Z7901 Long term (current) use of anticoagulants: Secondary | ICD-10-CM | POA: Diagnosis not present

## 2018-06-24 LAB — POCT INR: INR: 2.4 — AB (ref 0.9–1.1)

## 2018-06-28 ENCOUNTER — Telehealth: Payer: Self-pay

## 2018-06-28 NOTE — Telephone Encounter (Signed)
INR from 06/24/2018 is 2.4. Pt is currently taking 1mg  daily.

## 2018-06-28 NOTE — Telephone Encounter (Signed)
Coumadin level is therapeutic,  Continue current regimen and repeat PT/INR in one week 

## 2018-06-29 NOTE — Telephone Encounter (Signed)
Spoke with pt and informed her of her INR result and that she should continue taking the 1mg  daily, then recheck the INR again on Friday.

## 2018-07-01 LAB — POCT INR: INR: 2.2 — AB (ref <=1.1)

## 2018-07-02 ENCOUNTER — Telehealth: Payer: Self-pay

## 2018-07-02 NOTE — Telephone Encounter (Signed)
LMTCB. PEC may speak with pt.  

## 2018-07-02 NOTE — Telephone Encounter (Signed)
INR is 2.2. Pt is currently taking 1mg  daily. Result has been abstracted.

## 2018-07-02 NOTE — Telephone Encounter (Signed)
Coumadin level is therapeutic,  Continue current regimen and repeat PT/INR in one week 

## 2018-07-05 ENCOUNTER — Telehealth: Payer: Self-pay | Admitting: Internal Medicine

## 2018-07-05 NOTE — Telephone Encounter (Signed)
Reviewed results and physician's note with patient. No questions asked.  

## 2018-07-05 NOTE — Telephone Encounter (Signed)
LMTCB. PEC may speak with pt.  

## 2018-07-06 NOTE — Telephone Encounter (Signed)
Spoke with pt and informed her of her INR results. Pt gave a verbal understanding.  

## 2018-07-08 ENCOUNTER — Telehealth: Payer: Self-pay

## 2018-07-08 NOTE — Telephone Encounter (Signed)
Coumadin is in range 2.0 on 07/08/18 continue same dose of coumadin 1 mg daily and check again in 1 week   Britt

## 2018-07-08 NOTE — Telephone Encounter (Signed)
I called patient to advise per TMS to continue same Coumadin dose & check again in 1 week. Patient verbalized understanding.

## 2018-07-08 NOTE — Telephone Encounter (Signed)
[facility] results were received & INR was 2.0.  Coumadin dose is 1 mg tablet daily. Please advise?

## 2018-07-14 ENCOUNTER — Ambulatory Visit (INDEPENDENT_AMBULATORY_CARE_PROVIDER_SITE_OTHER): Payer: Medicare Other | Admitting: Internal Medicine

## 2018-07-14 ENCOUNTER — Encounter: Payer: Self-pay | Admitting: Internal Medicine

## 2018-07-14 DIAGNOSIS — Z9049 Acquired absence of other specified parts of digestive tract: Secondary | ICD-10-CM

## 2018-07-14 DIAGNOSIS — E1140 Type 2 diabetes mellitus with diabetic neuropathy, unspecified: Secondary | ICD-10-CM | POA: Diagnosis not present

## 2018-07-14 DIAGNOSIS — D510 Vitamin B12 deficiency anemia due to intrinsic factor deficiency: Secondary | ICD-10-CM

## 2018-07-14 NOTE — Progress Notes (Signed)
Subjective:  Patient ID: [name], female    DOB: June 05, 1922  Age: 85 y.o. MRN: [id number]  CC: Diagnoses of Type 2 diabetes mellitus with diabetic neuropathy, without long-term current use of insulin ([place]), Pernicious anemia, and History of colectomy were pertinent to this visit.  HPI [name] presents for 3 month follow up on T2DM with hypertension and SSS. Marland Kitchen Patient has no complaints today.  She is  following a low glycemic index diet Fasting sugars have been under  140 .  Patient is  No longer exercising and has become quite sedentary .  She spends most of the day reading.  She ambulates independently without use of aids, but has stopped attending [employer] classes since Christmas..  She has had an eye exam in the last 12 months and checks feet regularly for signs of infection.  Patient does not walk barefoot outside,  And denies any numbness tingling or burning in feet. Patient is up to date on all recommended vaccinations   Some urge incontinence makes her reluctant to travel     Outpatient Medications Prior to Visit  Medication Sig Dispense Refill  . cholecalciferol (VITAMIN D) 1000 UNITS tablet Take 1,000 Units by mouth daily.    . cyanocobalamin (,VITAMIN B-12,) 1000 MCG/ML injection INJECT 1 ML  INTO THE MUSCLE EVERY 14 DAYS. 10 mL 0  . diltiazem (CARTIA XT) 180 MG 24 hr capsule Take 1 capsule (180 mg total) by mouth every morning. 90 capsule 1  . diphenoxylate-atropine (LOMOTIL) 2.5-0.025 MG tablet TAKE ONE TABLET BY MOUTH TWICE A DAY AS NEEDED FOR DIARRHEA 60 tablet 1  . estradiol (ESTRACE) 0.1 MG/GM vaginal cream Place 2 g vaginally at bedtime. For 2 weeks,  Then reduce dose to 1 gram every 3-4 days 42.5 g 11  . hydrocortisone-pramoxine (ANALPRAM-HC) 2.5-1 % rectal cream APPLY RECTALLY THREE TIMES DAILY 30 g 0  . Hypromellose (GENTEAL) 0.3 % SOLN Place 1 drop into both eyes daily as needed (dryness).     Marland Kitchen ketoconazole (NIZORAL) 2 % cream Apply 1 application  topically daily as needed.   1  . metoprolol tartrate (LOPRESSOR) 50 MG tablet TAKE 1/2 TABLET BY MOUTH EVERY MORNING AND TAKE 1/2 TABLET BY MOUTH EVERY EVENING (Patient taking differently: Take 25 mg by mouth daily. TAKE 1/2 TABLET BY MOUTH EVERY MORNING) 30 tablet 2  . ondansetron (ZOFRAN) 4 MG tablet Take 1 tablet (4 mg total) by mouth every 8 (eight) hours as needed for nausea or vomiting. 30 tablet 0  . triamcinolone cream (KENALOG) 0.1 % Apply 1 application topically 2 (two) times daily. (Patient taking differently: Apply 1 application topically 2 (two) times daily as needed. ) 30 g 0  . warfarin (COUMADIN) 1 MG tablet TAKE ONE TABLET BY MOUTH DAILY AT 6PM 30 tablet 2  . warfarin (COUMADIN) 1 MG tablet TAKE ONE TABLET BY MOUTH DAILY AT 6PM 90 tablet 1   No facility-administered medications prior to visit.     Review of Systems;  Patient denies headache, fevers, malaise, unintentional weight loss, skin rash, eye pain, sinus congestion and sinus pain, sore throat, dysphagia,  hemoptysis , cough, dyspnea, wheezing, chest pain, palpitations, orthopnea, edema, abdominal pain, nausea, melena, diarrhea, constipation, flank pain, dysuria, hematuria, urinary  Frequency, nocturia, numbness, tingling, seizures,  Focal weakness, Loss of consciousness,  Tremor, insomnia, depression, anxiety, and suicidal ideation.      Objective:  BP 122/68 (BP Location: Left Arm, Patient Position: Sitting, Cuff Size: Normal)  Pulse 95   Temp 97.6 F (36.4 C) (Oral)   Resp 15   Ht 5\' 2"  (1.575 m)   Wt 138 lb 6.4 oz (62.8 kg)   SpO2 95%   BMI 25.31 kg/m   BP Readings from Last 3 Encounters:  07/14/18 122/68  04/12/18 132/64  02/02/18 (!) 114/56    Wt Readings from Last 3 Encounters:  07/14/18 138 lb 6.4 oz (62.8 kg)  04/12/18 136 lb 6.4 oz (61.9 kg)  02/02/18 137 lb 3.2 oz (62.2 kg)    General appearance: alert, cooperative and appears stated age Ears: normal TM's and external ear canals both  ears Throat: lips, mucosa, and tongue normal; teeth and gums normal Neck: no adenopathy, no carotid bruit, supple, symmetrical, trachea midline and thyroid not enlarged, symmetric, no tenderness/mass/nodules Back: symmetric, no curvature. ROM normal. No CVA tenderness. Lungs: clear to auscultation bilaterally Heart: regular rate and rhythm, S1, S2 normal, no murmur, click, rub or gallop Abdomen: soft, non-tender; bowel sounds normal; no masses,  no organomegaly Pulses: 2+ and symmetric Skin: Skin color, texture, turgor normal. No rashes or lesions Lymph nodes: Cervical, supraclavicular, and axillary nodes normal. Foot exam:  Nails are well trimmed,  No callouses,  Sensation intact to microfilament   Lab Results  Component Value Date   HGBA1C 6.6 (H) 04/12/2018   HGBA1C 6.6 (H) 10/06/2017   HGBA1C 6.7 (H) 04/07/2017    Lab Results  Component Value Date   CREATININE 0.89 04/12/2018   CREATININE 0.79 10/06/2017   CREATININE 0.91 05/15/2017    Lab Results  Component Value Date   WBC 7.9 04/12/2018   HGB 13.7 04/12/2018   HCT 40.9 04/12/2018   PLT 179.0 04/12/2018   GLUCOSE 174 (H) 04/12/2018   CHOL 142 10/11/2013   TRIG 194.0 (H) 10/11/2013   HDL 43.10 10/11/2013   LDLDIRECT 86.3 06/30/2012   LDLCALC 60 10/11/2013   ALT 14 04/12/2018   AST 17 04/12/2018   NA 140 04/12/2018   K 3.4 (L) 04/12/2018   CL 104 04/12/2018   CREATININE 0.89 04/12/2018   BUN 21 04/12/2018   CO2 28 04/12/2018   TSH 3.35 04/07/2017   INR 2.2 (A) 07/01/2018   HGBA1C 6.6 (H) 04/12/2018   MICROALBUR 0.9 04/12/2018    Dg Chest 2 View  Result Date: 05/15/2017 CLINICAL DATA:  Shortness of Breath EXAM: CHEST  2 VIEW COMPARISON:  May 22, 2014 FINDINGS: There is no edema or consolidation. Heart size and pulmonary vascularity are normal. Pacemaker leads are attached to the right atrium right ventricle. There is aortic atherosclerosis. No adenopathy. There is evidence of an old healed fracture of the  proximal left humerus. IMPRESSION: No edema or consolidation. Aortic atherosclerosis. Old fracture proximal left humerus. Aortic Atherosclerosis (ICD10-I70.0). Electronically Signed   By: Lowella Grip III M.D.   On: 05/15/2017 10:58   Ct Angio Chest Pe W And/or Wo Contrast  Result Date: 05/15/2017 CLINICAL DATA:  Acute onset of severe shortness of breath this morning. Worsening shortness of breath when lying flat. History of uterine and skin cancer and mitral valve regurgitation. Question pulmonary embolism. EXAM: CT ANGIOGRAPHY CHEST WITH CONTRAST TECHNIQUE: Multidetector CT imaging of the chest was performed using the standard protocol during bolus administration of intravenous contrast. Multiplanar CT image reconstructions and MIPs were obtained to evaluate the vascular anatomy. CONTRAST:  13mL ISOVUE-370 IOPAMIDOL (ISOVUE-370) INJECTION 76% COMPARISON:  Radiographs 05/15/2017. FINDINGS: Cardiovascular: The pulmonary arteries are well opacified with contrast to the level of the  subsegmental branches. There is no evidence of acute pulmonary embolism. There is limited opacification of the systemic vessels. Mild atherosclerosis of the aorta, great vessels and coronary arteries. Left subclavian pacemaker leads appear unchanged in the right atrium and right ventricle. Mild right chamber cardiac enlargement, likely chronic. No pericardial effusion. Mediastinum/Nodes: There are no enlarged mediastinal, hilar or axillary lymph nodes.There are small mediastinal lymph nodes. The thyroid gland, trachea and esophagus demonstrate no significant findings. Lungs/Pleura: No pleural effusion or pneumothorax. The lungs are clear. Upper abdomen: The visualized upper abdomen appears unremarkable aside from vascular calcifications. Musculoskeletal/Chest wall: There is no chest wall mass or suspicious osseous finding. Mild degenerative changes in the spine. Review of the MIP images confirms the above findings. IMPRESSION: 1.  No evidence of acute pulmonary embolism or other acute chest process. 2. Coronary and Aortic Atherosclerosis (ICD10-I70.0). Mild right chamber cardiac enlargement post pacemaker. Electronically Signed   By: Richardean Sale M.D.   On: 05/15/2017 12:07    Assessment & Plan:   Problem List Items Addressed This Visit    Type 2 diabetes mellitus with diabetic neuropathy, unspecified ([place])    Improved control with less dietary indulgences.   Given her age,  No treatment unless a1c is > 7.5.  Foot exam is normal.   Lab Results  Component Value Date   HGBA1C 6.6 (H) 04/12/2018   Lab Results  Component Value Date   MICROALBUR 0.9 04/12/2018         Pernicious anemia    Secondary to ileal resection in 2013.  She remains dependent on monthly B12 injections.  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  She has chronic diarrhea managed with lomotil          A total of 25 minutes of face to face time was spent with patient more than half of which was spent in counselling about the above mentioned conditions  and coordination of care .  She was encouraged to resume exercising to prevent deterioration in ambulatory ability  I am having Daleen Squibb. Tagle maintain her cholecalciferol, Hypromellose, hydrocortisone-pramoxine, triamcinolone cream, ondansetron, ketoconazole, warfarin, diltiazem, estradiol, warfarin, cyanocobalamin, diphenoxylate-atropine, and metoprolol tartrate.  No orders of the defined types were placed in this encounter.   There are no discontinued medications.  Follow-up: Return in about 3 months (around 10/12/2018) for follow up diabetes.   Crecencio Mc, MD

## 2018-07-14 NOTE — Patient Instructions (Addendum)
You need to walk (or pedal/push)  every day!  We will repeat labs in May   You can take 1 -2 calcium supplements daily     Potassium Content of Foods  Potassium is a mineral found in many foods and drinks. It affects how the heart works, and helps keep fluids and minerals balanced in the body. The amount of potassium you need each day depends on your age and any medical conditions you may have. Talk to your health care provider or dietitian about how much potassium you need. The following lists of foods provide the general serving size for foods and the approximate amount of potassium in each serving, listed in milligrams (mg). Actual values may vary depending on the product and how it is processed. High in potassium The following foods and beverages have 200 mg or more of potassium per serving:  Apricots (raw) - 2 have 200 mg of potassium.  Apricots (dry) - 5 have 200 mg of potassium.  Artichoke - 1 medium has 345 mg of potassium.  Avocado -  fruit has 245 mg of potassium.  Banana - 1 medium fruit has 425 mg of potassium.  [place] or baked beans (canned) -  cup has 280 mg of potassium.  White beans (canned) -  cup has 595 mg potassium.  Beef roast - 3 oz has 320 mg of potassium.  Ground beef - 3 oz has 270 mg of potassium.  Beets (raw or cooked) -  cup has 260 mg of potassium.  Bran muffin - 2 oz has 300 mg of potassium.  Broccoli (cooked) -  cup has 230 mg of potassium.  Brussels sprouts -  cup has 250 mg of potassium.  Cantaloupe -  cup has 215 mg of potassium.  Cereal, 100% bran -  cup has 200-400 mg of potassium.  Cheeseburger -1 single fast food burger has 225-400 mg of potassium.  Chicken - 3 oz has 220 mg of potassium.  Clams (canned) - 3 oz has 535 mg of potassium.  Crab - 3 oz has 225 mg of potassium.  Dates - 5 have 270 mg of potassium.  Dried beans and peas -  cup has 300-475 mg of potassium.  Figs (dried) - 2 have 260 mg of  potassium.  Fish (halibut, tuna, cod, snapper) - 3 oz has 480 mg of potassium.  Fish (salmon, haddock, swordfish, perch) - 3 oz has 300 mg of potassium.  Fish (tuna, canned) - 3 oz has 200 mg of potassium.  [place] fries (fast food) - 3 oz has 470 mg of potassium.  Granola with fruit and nuts -  cup has 200 mg of potassium.  Grapefruit juice -  cup has 200 mg of potassium.  Honeydew melon -  cup has 200 mg of potassium.  Kale (raw) - 1 cup has 300 mg of potassium.  Kiwi - 1 medium fruit has 240 mg of potassium.  Kohlrabi, rutabaga, parsnips -  cup has 280 mg of potassium.  Lentils -  cup has 365 mg of potassium.  Mango - 1 each has 325 mg of potassium.  Milk (nonfat, low-fat, whole, buttermilk) - 1 cup has 350-380 mg of potassium.  Milk (chocolate) - 1 cup has 420 mg of potassium  Molasses - 1 Tbsp has 295 mg of potassium.  Mushrooms -  cup has 280 mg of potassium.  Nectarine - 1 each has 275 mg of potassium.  Nuts (almonds, peanuts, hazelnuts, [place], cashew, mixed) - 1 oz  has 200 mg of potassium.  Nuts (pistachios) - 1 oz has 295 mg of potassium.  Orange - 1 fruit has 240 mg of potassium.  Orange juice -  cup has 235 mg of potassium.  Papaya -  medium fruit has 390 mg of potassium.  Peanut butter (chunky) - 2 Tbsp has 240 mg of potassium.  Peanut butter (smooth) - 2 Tbsp has 210 mg of potassium.  Pear - 1 medium (200 mg) of potassium.  Pomegranate - 1 whole fruit has 400 mg of potassium.  Pomegranate juice -  cup has 215 mg of potassium.  Pork - 3 oz has 350 mg of potassium.  Potato chips (salted) - 1 oz has 465 mg of potassium.  Potato (baked with skin) - 1 medium has 925 mg of potassium.  Potato (boiled) -  cup has 255 mg of potassium.  Potato (Mashed) -  cup has 330 mg of potassium.  Prune juice -  cup has 370 mg of potassium.  Prunes - 5 have 305 mg of potassium.  Pudding (chocolate) -  cup has 230 mg of potassium.  Pumpkin  (canned) -  cup has 250 mg of potassium.  Raisins (seedless) -  cup has 270 mg of potassium.  Seeds (sunflower or pumpkin) - 1 oz has 240 mg of potassium.  Soy milk - 1 cup has 300 mg of potassium.  Spinach (cooked) - 1/2 cup has 420 mg of potassium.  Spinach (canned) -  cup has 370 mg of potassium.  Sweet potato (baked with skin) - 1 medium has 450 mg of potassium.  Swiss chard -  cup has 480 mg of potassium.  Tomato or vegetable juice -  cup has 275 mg of potassium.  Tomato (sauce or puree) -  cup has 400-550 mg of potassium.  Tomato (raw) - 1 medium has 290 mg of potassium.  Tomato (canned) -  cup has 200-300 mg of potassium.  [place] - 3 oz has 250 mg of potassium.  Wheat germ - 1 oz has 250 mg of potassium.  Winter squash -  cup has 250 mg of potassium.  Yogurt (plain or fruited) - 6 oz has 260-435 mg of potassium.  Zucchini -  cup has 220 mg of potassium. Moderate in potassium The following foods and beverages have 50-200 mg of potassium per serving:  Apple - 1 fruit has 150 mg of potassium  Apple juice -  cup has 150 mg of potassium  Applesauce -  cup has 90 mg of potassium  Apricot nectar -  cup has 140 mg of potassium  Asparagus (small spears) -  cup has 155 mg of potassium  Asparagus (large spears) - 6 have 155 mg of potassium  Bagel (cinnamon raisin) - 1 four-inch bagel has 130 mg of potassium  Bagel (egg or plain) - 1 four- inch bagel has 70 mg of potassium  Beans (green) -  cup has 90 mg of potassium  Beans (yellow) -  cup has 190 mg of potassium  Beer, regular - 12 oz has 100 mg of potassium  Beets (canned) -  cup has 125 mg of potassium  Blackberries -  cup has 115 mg of potassium  Blueberries -  cup has 60 mg of potassium  Bread (whole wheat) - 1 slice has 70 mg of potassium  Broccoli (raw) -  cup has 145 mg of potassium  Cabbage -  cup has 150 mg of potassium  Carrots (cooked or raw) -  cup has 180 mg of  potassium  Cauliflower (raw) -  cup has 150 mg of potassium  Celery (raw) -  cup has 155 mg of potassium  Cereal, bran flakes -  cup has 120-150 mg of potassium  Cheese (cottage) -  cup has 110 mg of potassium  Cherries - 10 have 150 mg of potassium  Chocolate - 1 oz bar has 165 mg of potassium  Coffee (brewed) - 6 oz has 90 mg of potassium  Corn -  cup or 1 ear has 195 mg of potassium  Cucumbers -  cup has 80 mg of potassium  Egg - 1 large egg has 60 mg of potassium  Eggplant -  cup has 60 mg of potassium  Endive (raw) -  cup has 80 mg of potassium  English muffin - 1 has 65 mg of potassium  Fish (ocean perch) - 3 oz has 192 mg of potassium  Frankfurter, beef or pork - 1 has 75 mg of potassium  Fruit cocktail -  cup has 115 mg of potassium  Grape juice -  cup has 170 mg of potassium  Grapefruit -  fruit has 175 mg of potassium  Grapes -  cup has 155 mg of potassium  Greens: kale, turnip, collard -  cup has 110-150 mg of potassium  Ice cream or frozen yogurt (chocolate) -  cup has 175 mg of potassium  Ice cream or frozen yogurt (vanilla) -  cup has 120-150 mg of potassium  Lemons, limes - 1 each has 80 mg of potassium  Lettuce - 1 cup has 100 mg of potassium  Mixed vegetables -  cup has 150 mg of potassium  Mushrooms, raw -  cup has 110 mg of potassium  Nuts (walnuts, pecans, or macadamia) - 1 oz has 125 mg of potassium  Oatmeal -  cup has 80 mg of potassium  Okra -  cup has 110 mg of potassium  Onions -  cup has 120 mg of potassium  Peach - 1 has 185 mg of potassium  Peaches (canned) -  cup has 120 mg of potassium  Pears (canned) -  cup has 120 mg of potassium  Peas, green (frozen) -  cup has 90 mg of potassium  Peppers (Green) -  cup has 130 mg of potassium  Peppers (Red) -  cup has 160 mg of potassium  Pineapple juice -  cup has 165 mg of potassium  Pineapple (fresh or canned) -  cup has 100 mg of  potassium  Plums - 1 has 105 mg of potassium  Pudding, vanilla -  cup has 150 mg of potassium  Raspberries -  cup has 90 mg of potassium  Rhubarb -  cup has 115 mg of potassium  Rice, wild -  cup has 80 mg of potassium  Shrimp - 3 oz has 155 mg of potassium  Spinach (raw) - 1 cup has 170 mg of potassium  Strawberries -  cup has 125 mg of potassium  Summer squash -  cup has 175-200 mg of potassium  Swiss chard (raw) - 1 cup has 135 mg of potassium  Tangerines - 1 fruit has 140 mg of potassium  Tea, brewed - 6 oz has 65 mg of potassium  Turnips -  cup has 140 mg of potassium  Watermelon -  cup has 85 mg of potassium  Wine (Red, table) - 5 oz has 180 mg of potassium  Wine (White, table) - 5 oz 100  mg of potassium Low in potassium The following foods and beverages have less than 50 mg of potassium per serving.  Bread (white) - 1 slice has 30 mg of potassium  Carbonated beverages - 12 oz has less than 5 mg of potassium  Cheese - 1 oz has 20-30 mg of potassium  Cranberries -  cup has 45 mg of potassium  Cranberry juice cocktail -  cup has 20 mg of potassium  Fats and oils - 1 Tbsp has less than 5 mg of potassium  Hummus - 1 Tbsp has 32 mg of potassium  Nectar (papaya, mango, or pear) -  cup has 35 mg of potassium  Rice (white or brown) -  cup has 50 mg of potassium  Spaghetti or macaroni (cooked) -  cup has 30 mg of potassium  Tortilla, flour or corn - 1 has 50 mg of potassium  Waffle - 1 four-inch waffle has 50 mg of potassium  Water chestnuts -  cup has 40 mg of potassium Summary  Potassium is a mineral found in many foods and drinks. It affects how the heart works, and helps keep fluids and minerals balanced in the body.  The amount of potassium you need each day depends on your age and any existing medical conditions you may have. Your health care provider or dietitian may recommend an amount of potassium that you should have each  day. This information is not intended to replace advice given to you by your health care provider. Make sure you discuss any questions you have with your health care provider. Document Released: 12/17/2004 Document Revised: 07/30/2016 Document Reviewed: 07/30/2016 Elsevier Interactive Patient Education  2019 [employer].

## 2018-07-16 ENCOUNTER — Encounter: Payer: Self-pay | Admitting: Internal Medicine

## 2018-07-16 LAB — PROTIME-INR

## 2018-07-17 NOTE — Assessment & Plan Note (Signed)
Continue use of Imodium and Lomotil

## 2018-07-17 NOTE — Assessment & Plan Note (Addendum)
Secondary to ileal resection in 2013.  She remains dependent on monthly B12 injections.  Lab Results  Component Value Date   [id number] 04/12/2018

## 2018-07-17 NOTE — Assessment & Plan Note (Signed)
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  She has chronic diarrhea managed with lomotil

## 2018-07-17 NOTE — Assessment & Plan Note (Signed)
Improved control with less dietary indulgences.   Given her age,  No treatment unless a1c is > 7.5.  Foot exam is normal.   Lab Results  Component Value Date   HGBA1C 6.6 (H) 04/12/2018   Lab Results  Component Value Date   MICROALBUR 0.9 04/12/2018

## 2018-07-19 ENCOUNTER — Telehealth: Payer: Self-pay

## 2018-07-19 NOTE — Telephone Encounter (Signed)
INR result from 07/16/2018 is 2.1. Pt is currently taking 1mg  coumadin daily.

## 2018-07-19 NOTE — Telephone Encounter (Signed)
Spoke with pt and informed her of her INR results and that she needs to continue taking her 1mg  daily. Pt gave a verbal understanding. While on the phone the pt stated that the pharmacy had not received the metoprolol rx with the new directions on it. The pt stated that she is taking only a half a tablet once daily. Is it okay to change the directions of use and send in a new rx?

## 2018-07-19 NOTE — Telephone Encounter (Signed)
Coumadin level is therapeutic,  Continue current regimen and repeat PT/INR in one week 

## 2018-07-20 ENCOUNTER — Ambulatory Visit: Payer: Self-pay

## 2018-07-20 MED ORDER — METOPROLOL SUCCINATE ER 25 MG PO TB24: 25.0000 mg | ORAL_TABLET | Freq: Every day | ORAL | 3 refills | Status: DC

## 2018-07-20 NOTE — Telephone Encounter (Signed)
The new rx for once daily metoprolol 25 mg has been sent.  She should take it at night instead of in the morning.  It protects her heart better that way

## 2018-07-20 NOTE — Addendum Note (Signed)
Addended by: Crecencio Mc on: 07/20/2018 09:12 AM   Modules accepted: Orders

## 2018-07-20 NOTE — Telephone Encounter (Signed)
LMTCB. PEC may speak with pt.  

## 2018-07-20 NOTE — Telephone Encounter (Signed)
Spoke  With Patient.  And  Related Dr.  Derrel Nip message  Patient  Voiced understanding and  Will follow instructions

## 2018-07-22 NOTE — Telephone Encounter (Signed)
Spoke with pt and and informed her that Dr. Derrel Nip sent in a new rx for the metoprolol and that she would like for her to take it at night rather than in the morning. The pt stated that she already takes this medication at night.

## 2018-07-23 DIAGNOSIS — Z7901 Long term (current) use of anticoagulants: Secondary | ICD-10-CM | POA: Diagnosis not present

## 2018-07-23 LAB — POCT INR: INR: 1.7 — AB (ref 0.9–1.1)

## 2018-07-26 ENCOUNTER — Telehealth: Payer: Self-pay | Admitting: Internal Medicine

## 2018-07-26 NOTE — Telephone Encounter (Signed)
Pt's INR results for 07/26/18 is 1.7  Called pt to confirm dosage of Coumadin.  Pt said that she is taking 1 mg per day but thinks she may have missed a dosage one night last week since she had an extra pill left in her box last Monday.  Please advise.

## 2018-07-26 NOTE — Telephone Encounter (Signed)
Spoke with pt and informed her of her INR result and that she needs to continue her current medication does. Pt gave a verbal understanding.

## 2018-07-26 NOTE — Telephone Encounter (Signed)
Copied from [place]. Topic: Quick Communication - See Telephone Encounter >> Jul 26, 2018 12:22 PM Rayann Heman wrote: CRM for notification. See Telephone encounter for: 07/26/18. Caren Griffins calling with [facility] called to report INR of 1.7. please advise

## 2018-07-26 NOTE — Telephone Encounter (Signed)
Coumadin level is therapeutic,  Continue current regimen and repeat PT/INR in one week 

## 2018-07-31 LAB — POCT INR: INR: 2 — AB (ref 0.9–1.1)

## 2018-08-02 ENCOUNTER — Telehealth: Payer: Self-pay

## 2018-08-02 NOTE — Telephone Encounter (Signed)
Spoke with pt and informed her of her INR results and that she needs to continue the 1mg  daily of her coumadin. Pt gave a verbal understanding.

## 2018-08-02 NOTE — Telephone Encounter (Signed)
Coumadin level is therapeutic,  Continue current regimen and repeat PT/INR in one week 

## 2018-08-02 NOTE — Telephone Encounter (Signed)
INR result from 07/31/2018 is 2.0. Pt is currently taking coumadin 1mg  daily. Result has been abstracted.

## 2018-08-03 ENCOUNTER — Other Ambulatory Visit: Payer: Self-pay | Admitting: Internal Medicine

## 2018-08-03 DIAGNOSIS — I4891 Unspecified atrial fibrillation: Secondary | ICD-10-CM

## 2018-08-03 NOTE — Telephone Encounter (Signed)
Patient inquired if this could possibly be sent in today, as she is a resident of [name] and states they will be "on lockdown" if coronavirus continues to spread.

## 2018-08-09 ENCOUNTER — Encounter: Payer: Self-pay | Admitting: Internal Medicine

## 2018-08-09 LAB — PROTIME-INR

## 2018-08-10 ENCOUNTER — Telehealth: Payer: Self-pay

## 2018-08-10 NOTE — Telephone Encounter (Signed)
INR results from 08/09/2018 is 2.1. Pt is currently taking 1mg  daily.

## 2018-08-10 NOTE — Telephone Encounter (Signed)
  Your INR/coumadin level is therapeutic,  Continue current regimen and repeat PT/INR in one week   

## 2018-08-10 NOTE — Telephone Encounter (Signed)
Spoke with pt and informed her of her INR results and that she will need to continue her current current 1mg  daily. Pt gave a verbal understanding.

## 2018-08-16 LAB — POCT INR: INR: 2.1 — AB (ref 0.9–1.1)

## 2018-08-18 ENCOUNTER — Telehealth: Payer: Self-pay

## 2018-08-18 NOTE — Telephone Encounter (Signed)
Coumadin level is therapeutic,  Continue current regimen and repeat PT/INR in one week 

## 2018-08-18 NOTE — Telephone Encounter (Signed)
Spoke with pt and informed her of her INR results and that she needs to continue taking the 1mg  daily. Pt gave a verbal understanding.   While on the phone with the pt she stated that she feels like she has a UTI. The pt stated that about one week ago she started having some vaginal itching, frequency and dark colored urine. The pt stated that she has been trying to increase her water intake but it does not seem to have helped. Spoke with the [facility] and they stated that they can collect a urine and run a UA and culture on the pt so she does not have to come out. If you are okay with that is it okay if I send them the orders for the UA and culture?

## 2018-08-18 NOTE — Telephone Encounter (Signed)
INR results from 08/16/2018 is 2.1. Pt is currently taking 1mg  coumadin daily.

## 2018-08-19 NOTE — Telephone Encounter (Signed)
YES, AND SHE WILL NEED TO BE SET UP FOR A PHONE VISIT ONCE THE URINE HAS BEEN RESULTED SO I CAN BE PAID FOR THE INTERPRETATION AND TREATMENT

## 2018-08-19 NOTE — Telephone Encounter (Signed)
Order has been placed in quick sign folder to be faxed to [facility] at [phone].

## 2018-08-23 ENCOUNTER — Encounter: Payer: Self-pay | Admitting: Internal Medicine

## 2018-08-23 ENCOUNTER — Telehealth: Payer: Self-pay | Admitting: Internal Medicine

## 2018-08-23 DIAGNOSIS — Z7901 Long term (current) use of anticoagulants: Secondary | ICD-10-CM | POA: Diagnosis not present

## 2018-08-23 LAB — POCT INR: INR: 2.4 — AB (ref 0.9–1.1)

## 2018-08-23 NOTE — Telephone Encounter (Signed)
Copied from [place] [id number]. Topic: Quick Communication - See Telephone Encounter >> Aug 23, 2018  9:59 AM Loma Boston wrote: CRM for notification. See Telephone encounter for: 08/23/18. PT needs a call back from [facility]. She had spoken with her earlier last week about a infection maybe in her urinary tract and said they were awaiting some test results but she has been waiting and has not heard back. She says over the weekend she started feeling poorly and her urine is very dark, she is thinking she does have some infection and wants a call back. She says she has a sterile jar and that if someone could touch base with her before 11:15 that [facility] could send the specimen by courier. Please follow up asap to [phone]  Age 85

## 2018-08-23 NOTE — Telephone Encounter (Signed)
Aaron Edelman with [facility] is calling in to follow up on order being faxed. I did advise per current status per Janett Billow note 08/19/18. Advised that I would have Janett Billow to follow up on order being faxed.   Please assist.

## 2018-08-23 NOTE — Telephone Encounter (Signed)
Spoke with pt to let her know that the order was faxed on Friday but that I would refax it right now. Order has been refaxed.

## 2018-08-23 NOTE — Telephone Encounter (Signed)
Please see previous message

## 2018-08-25 ENCOUNTER — Telehealth: Payer: Self-pay

## 2018-08-25 NOTE — Telephone Encounter (Signed)
Coumadin level is therapeutic,  Continue current regimen and repeat PT/INR in one week 

## 2018-08-25 NOTE — Telephone Encounter (Signed)
INR result from 08/23/2018 is 2.4. Pt is taking 1mg  daily.

## 2018-08-25 NOTE — Telephone Encounter (Signed)
Spoke with pt and informed her of her INR result and to continue her current current coumadin dose. Pt gave a verbal understanding.

## 2018-08-30 ENCOUNTER — Telehealth: Payer: Self-pay

## 2018-08-30 NOTE — Telephone Encounter (Signed)
Spoke with Aaron Edelman to let him know that we did receive the UA results but that Dr. Derrel Nip was waiting on the culture results before prescribing an antibiotic. Aaron Edelman stated that he would check on the culture results and if they have them he would fax them over to [place].

## 2018-08-30 NOTE — Telephone Encounter (Signed)
Copied from [place] [id number]. Topic: General - Other >> Aug 30, 2018  3:01 PM Yvette Rack wrote: Reason for CRM: Kara [name] with [facility] stated patient's urine results were faxed on 08/25/18 and if an antibiotic is recommended patient would need to be contacted. Cb# [phone]

## 2018-08-31 LAB — POCT INR: INR: 2.1 — AB (ref 0.9–1.1)

## 2018-08-31 NOTE — Telephone Encounter (Signed)
Kara [name] is returning a call from the office

## 2018-08-31 NOTE — Telephone Encounter (Signed)
Patient called for culture results did [facility] fax them over yet?

## 2018-08-31 NOTE — Telephone Encounter (Signed)
Spoke with pt to let her know that per Dr. Lupita Dawn verbal the pt does not have a UTI. Pt gave a verbal understanding.

## 2018-09-01 ENCOUNTER — Telehealth: Payer: Self-pay

## 2018-09-01 NOTE — Telephone Encounter (Signed)
Coumadin level is therapeutic,  Continue current regimen and repeat PT/INR in one week 

## 2018-09-01 NOTE — Telephone Encounter (Signed)
Pt's INR from 08/31/2018 is 2.1. Pt is currently taking 1mg  daily.

## 2018-09-02 NOTE — Telephone Encounter (Signed)
Spoke with pt and informed her of her INR results and that she needs to continue taking 1mg  daily. Pt gave a verbal understanding.

## 2018-09-07 LAB — PROTIME-INR: INR: 1.6 — AB (ref 0.9–1.1)

## 2018-09-08 ENCOUNTER — Telehealth: Payer: Self-pay | Admitting: Internal Medicine

## 2018-09-08 ENCOUNTER — Telehealth: Payer: Self-pay

## 2018-09-08 NOTE — Telephone Encounter (Signed)
error 

## 2018-09-08 NOTE — Telephone Encounter (Signed)
Coumadin level is therapeutic ).   Continue current regimen and repeat PT/INR in one week

## 2018-09-08 NOTE — Telephone Encounter (Signed)
Patient notified and voiced understanding.

## 2018-09-08 NOTE — Telephone Encounter (Signed)
Rec'd call from [facility] with INR results from 09/07/18; INR 1.6.  Per Representative, results were faxed to the PCP.  Will forward this note to PCP.

## 2018-09-08 NOTE — Telephone Encounter (Signed)
Patient taking 1 mg coumadin daily INR= 1.6 on 09/07/18

## 2018-09-13 ENCOUNTER — Telehealth: Payer: Self-pay | Admitting: Internal Medicine

## 2018-09-13 DIAGNOSIS — I4891 Unspecified atrial fibrillation: Secondary | ICD-10-CM

## 2018-09-13 NOTE — Telephone Encounter (Signed)
Copied from [place] [id number]. Topic: Quick Communication - Rx Refill/Question >> Sep 13, 2018  9:01 AM Sheran Luz wrote: Medication: Levada Dy, with [facility], calling on behalf of patient. She states that patient has requested a 90 day supply of all medications prescribed by PCP. Please advise.    Preferred Pharmacy (with phone number or street name):[facility], [place] [phone] (Phone) [phone] (Fax)

## 2018-09-14 LAB — POCT INR: INR: 2.2 — AB (ref <=1.1)

## 2018-09-14 MED ORDER — METOPROLOL SUCCINATE ER 25 MG PO TB24: 25.0000 mg | ORAL_TABLET | Freq: Every day | ORAL | 1 refills | Status: DC

## 2018-09-14 MED ORDER — DILTIAZEM HCL ER COATED BEADS 180 MG PO CP24: 180.0000 mg | ORAL_CAPSULE | Freq: Every morning | ORAL | 1 refills | Status: DC

## 2018-09-14 MED ORDER — WARFARIN SODIUM 1 MG PO TABS: ORAL_TABLET | ORAL | 1 refills | Status: DC

## 2018-09-14 NOTE — Telephone Encounter (Signed)
Medications have been refilled for a 90 days supply.

## 2018-09-15 ENCOUNTER — Other Ambulatory Visit: Payer: Self-pay | Admitting: Internal Medicine

## 2018-09-15 DIAGNOSIS — K529 Noninfective gastroenteritis and colitis, unspecified: Secondary | ICD-10-CM

## 2018-09-15 NOTE — Telephone Encounter (Signed)
Last OV 07/14/2018  Last refilled 05/11/2018 disp 60 with 1 refill   Next OV 10/12/2018  Sent to PCP for approval

## 2018-09-17 ENCOUNTER — Telehealth: Payer: Self-pay

## 2018-09-17 NOTE — Telephone Encounter (Signed)
Coumadin level is therapeutic,  Continue current regimen and repeat PT/INR in one week 

## 2018-09-17 NOTE — Telephone Encounter (Signed)
Spoke with pt and informed her of her INR results. Pt gave a verbal understanding.

## 2018-09-17 NOTE — Telephone Encounter (Signed)
INR from 09/14/2018 is 2.2. Pt is currently taking 1mg  daily.

## 2018-09-20 DIAGNOSIS — I495 Sick sinus syndrome: Secondary | ICD-10-CM | POA: Diagnosis not present

## 2018-09-20 DIAGNOSIS — I480 Paroxysmal atrial fibrillation: Secondary | ICD-10-CM | POA: Diagnosis not present

## 2018-09-20 DIAGNOSIS — I071 Rheumatic tricuspid insufficiency: Secondary | ICD-10-CM | POA: Diagnosis not present

## 2018-09-20 DIAGNOSIS — I2510 Atherosclerotic heart disease of native coronary artery without angina pectoris: Secondary | ICD-10-CM | POA: Diagnosis not present

## 2018-09-20 DIAGNOSIS — E782 Mixed hyperlipidemia: Secondary | ICD-10-CM | POA: Diagnosis not present

## 2018-09-21 DIAGNOSIS — Z7901 Long term (current) use of anticoagulants: Secondary | ICD-10-CM | POA: Diagnosis not present

## 2018-09-21 LAB — POCT INR: INR: 2.4 — AB (ref 0.9–1.1)

## 2018-09-22 ENCOUNTER — Telehealth: Payer: Self-pay

## 2018-09-22 NOTE — Telephone Encounter (Signed)
Spoke with pt and informed her of her INR result and that she needs to continue her current coumadin dose. Pt gave a verbal understanding.

## 2018-09-22 NOTE — Telephone Encounter (Signed)
Coumadin level is therapeutic,  Continue current regimen and repeat PT/INR in one week 

## 2018-09-22 NOTE — Telephone Encounter (Signed)
Pt's INR from 09/21/2018 is 2.4. Pt is currently taking 1mg  daily.

## 2018-09-28 LAB — POCT INR: INR: 2.1 — AB (ref 0.9–1.1)

## 2018-10-01 ENCOUNTER — Telehealth: Payer: Self-pay | Admitting: Family

## 2018-10-01 NOTE — Telephone Encounter (Signed)
Spoke with pt and informed her of her INR result and that she needs to continue taking the 1mg  daily. Pt gave a verbal understanding.

## 2018-10-01 NOTE — Telephone Encounter (Signed)
Coumadin level is therapeutic,  Continue current regimen and repeat PT/INR in one week 

## 2018-10-05 ENCOUNTER — Telehealth: Payer: Self-pay

## 2018-10-05 LAB — POCT INR: INR: 2.1 — AB (ref 0.9–1.1)

## 2018-10-05 NOTE — Telephone Encounter (Signed)
Pt's INR result from 10/05/2018 is 2.1. Pt is currently taking 1mg  daily.   Already abstracted.

## 2018-10-05 NOTE — Telephone Encounter (Signed)
Coumadin level is therapeutic,  Continue current regimen and repeat PT/INR in one week 

## 2018-10-06 NOTE — Telephone Encounter (Signed)
Spoke with pt and informed her of her INR results and that she should continue taking the 1mg  daily. Pt gave a verbal understanding.

## 2018-10-12 ENCOUNTER — Encounter: Payer: Self-pay | Admitting: Internal Medicine

## 2018-10-12 ENCOUNTER — Other Ambulatory Visit: Payer: Self-pay

## 2018-10-12 ENCOUNTER — Ambulatory Visit (INDEPENDENT_AMBULATORY_CARE_PROVIDER_SITE_OTHER): Payer: Medicare Other | Admitting: Internal Medicine

## 2018-10-12 DIAGNOSIS — Z7189 Other specified counseling: Secondary | ICD-10-CM

## 2018-10-12 DIAGNOSIS — Z7901 Long term (current) use of anticoagulants: Secondary | ICD-10-CM | POA: Diagnosis not present

## 2018-10-12 DIAGNOSIS — I10 Essential (primary) hypertension: Secondary | ICD-10-CM

## 2018-10-12 DIAGNOSIS — E1140 Type 2 diabetes mellitus with diabetic neuropathy, unspecified: Secondary | ICD-10-CM | POA: Diagnosis not present

## 2018-10-12 DIAGNOSIS — D510 Vitamin B12 deficiency anemia due to intrinsic factor deficiency: Secondary | ICD-10-CM | POA: Diagnosis not present

## 2018-10-12 DIAGNOSIS — M1990 Unspecified osteoarthritis, unspecified site: Secondary | ICD-10-CM

## 2018-10-12 DIAGNOSIS — Z9049 Acquired absence of other specified parts of digestive tract: Secondary | ICD-10-CM

## 2018-10-12 LAB — POCT INR: INR: 2.5 — AB (ref <=1.1)

## 2018-10-12 NOTE — Assessment & Plan Note (Signed)
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  She has chronic diarrhea managed with lomotil .  B12 level was < 300 in NOvember and she is supplementing

## 2018-10-12 NOTE — Assessment & Plan Note (Signed)
Well controlled on current regimen. Renal function assessment is due,  Orders sent to [facility] for testing                                                                                                                                                                                                             Lab Results  Component Value Date   CREATININE 0.89 04/12/2018   Lab Results  Component Value Date   NA 140 04/12/2018   K 3.4 (L) 04/12/2018   CL 104 04/12/2018   CO2 28 04/12/2018

## 2018-10-12 NOTE — Assessment & Plan Note (Signed)

## 2018-10-12 NOTE — Progress Notes (Signed)
Telephone  Note  This visit type was conducted due to national recommendations for restrictions regarding the COVID-19 pandemic (e.g. social distancing).  This format is felt to be most appropriate for this patient at this time.  All issues noted in this document were discussed and addressed.  No physical exam was performed (except for noted visual exam findings with Video Visits).   I connected with@ on 10/12/18 at  1:30 PM EDT by  telephone and verified that I am speaking with the correct person using two identifiers. Location patient: home Location provider:  home office Persons participating in the virtual visit: patient, provider  I discussed the limitations, risks, security and privacy concerns of performing an evaluation and management service by telephone and the availability of in person appointments. I also discussed with the patient that there may be a patient responsible charge related to this service. The patient expressed understanding and agreed to proceed.  Reason for visit: follow up on type 2 DM, hypertension   HPI:   The patient has no signs or symptoms of COVID 19 infection (fever, cough, sore throat  or shortness of breath beyond what is typical for patient).  Patient denies contact with other persons with the above mentioned symptoms or with anyone confirmed to have COVID 19 .  She has not left her home and the grounds of [facility] since March 1 and has had no visitors.  [facility] is providing one hot meal daily .  She is trying to walk daily   Has 5 BM's daily chronically.  She has had 2 prior intestinal surgeries  (right colon and terminal ileum in 2013 ) due to SBO .  Path report was  Ischemic injury. No cancer .  Hypertension: patient checks blood pressure twice weekly at home.  Readings have been for the most part > 140/80 at rest . Patient is following a reduce salt diet most days and is taking medications as prescribed    follow up on Type 2 DM, .  She has been following a low  glycemic index diet regularly and has gained a few lbs .  Fasting sugars have been elevated with occaional sugars close to 200.  She is not exercsing regularly.  Denies hypoglyemic events.      Lab Results  Component Value Date   VITAMINB12 284 04/12/2018     ROS: See pertinent positives and negatives per HPI.  Past Medical History:  Diagnosis Date  . Allergy   . Atrial fibrillation ([place])   . CAD (coronary artery disease)   . Cancer (HCC)    UTERINE  . Chronic anticoagulation   . Chronic diarrhea   . Decubitus ulcer    sacral region  . Diabetes ([place])    diet controlled  . Dysuria   . Edema of lower extremity    mainly right foot, slightly in left foot.  . Fibrocystic breast disease   . GERD (gastroesophageal reflux disease)   . Glaucoma   . Glaucoma   . Hematuria   . Hemorrhoids   . History of colon polyps   . History of pancreatitis   . Hyperlipidemia   . Hypertension   . Hypokalemia   . IBS (irritable bowel syndrome)   . Microscopic hematuria   . Mitral valve regurgitation   . Osteoarthritis    fingers  . Pernicious anemia   . Plantar fasciitis   . Recurrent UTI   . Skin cancer   . Sleep apnea, obstructive   .  Vaginal atrophy   . Vitamin D deficiency   . Yeast vaginitis     Past Surgical History:  Procedure Laterality Date  . ABDOMINAL HYSTERECTOMY  1980's  . ABDOMINAL SURGERY     for villous polyp,,,many years ago  . APPENDECTOMY  1940's  . ASCAD, s/p PTCA  11/28/2005   MID LESION   . BREAST BIOPSY Left 1970's  . CARPAL TUNNEL RELEASE Right 12/13/2015   Procedure: CARPAL TUNNEL RELEASE;  Surgeon: Thornton Park, MD;  Location: [facility] ORS;  Service: Orthopedics;  Laterality: Right;  . COLECTOMY  2015  . PACEMAKER PLACEMENT    . radation     for uterine cance  . REFRACTIVE SURGERY     for bilateral glaumoma  . TONSILLECTOMY  1936    Family History  Problem Relation Age of Onset  . Coronary artery disease Father   . Kidney disease Father   .  Cancer Sister   . Diabetes Unknown   . Cancer Mother        COLON  . Bladder Cancer Neg Hx   . Breast cancer Neg Hx     SOCIAL HX:  reports that she has quit smoking. She has never used smokeless tobacco. She reports current alcohol use. She reports that she does not use drugs.  Current Outpatient Medications:  .  cholecalciferol (VITAMIN D) 1000 UNITS tablet, Take 1,000 Units by mouth daily., Disp: , Rfl:  .  cyanocobalamin (,VITAMIN B-12,) 1000 MCG/ML injection, INJECT 1 ML  INTO THE MUSCLE EVERY 14 DAYS., Disp: 10 mL, Rfl: 0 .  diltiazem (CARTIA XT) 180 MG 24 hr capsule, Take 1 capsule (180 mg total) by mouth every morning., Disp: 90 capsule, Rfl: 1 .  diphenoxylate-atropine (LOMOTIL) 2.5-0.025 MG tablet, TAKE ONE TABLET BY MOUTH TWICE A DAY AS NEEDED FOR DIARRHEA, Disp: 60 tablet, Rfl: 0 .  estradiol (ESTRACE) 0.1 MG/GM vaginal cream, Place 2 g vaginally at bedtime. For 2 weeks,  Then reduce dose to 1 gram every 3-4 days, Disp: 42.5 g, Rfl: 11 .  hydrocortisone-pramoxine (ANALPRAM-HC) 2.5-1 % rectal cream, APPLY RECTALLY THREE TIMES DAILY, Disp: 30 g, Rfl: 0 .  Hypromellose (GENTEAL) 0.3 % SOLN, Place 1 drop into both eyes daily as needed (dryness). , Disp: , Rfl:  .  ketoconazole (NIZORAL) 2 % cream, Apply 1 application topically daily as needed. , Disp: , Rfl: 1 .  metoprolol succinate (TOPROL-XL) 25 MG 24 hr tablet, Take 1 tablet (25 mg total) by mouth at bedtime., Disp: 90 tablet, Rfl: 1 .  ondansetron (ZOFRAN) 4 MG tablet, Take 1 tablet (4 mg total) by mouth every 8 (eight) hours as needed for nausea or vomiting., Disp: 30 tablet, Rfl: 0 .  triamcinolone cream (KENALOG) 0.1 %, Apply 1 application topically 2 (two) times daily. (Patient taking differently: Apply 1 application topically 2 (two) times daily as needed. ), Disp: 30 g, Rfl: 0 .  warfarin (COUMADIN) 1 MG tablet, TAKE ONE TABLET BY MOUTH DAILY AT 6PM, Disp: 90 tablet, Rfl: 1  EXAM:  VITALS per patient if  applicable:  GENERAL: alert, oriented, appears well and in no acute distress  HEENT: atraumatic, conjunttiva clear, no obvious abnormalities on inspection of external nose and ears  NECK: normal movements of the head and neck  LUNGS: on inspection no signs of respiratory distress, breathing rate appears normal, no obvious gross SOB, gasping or wheezing  CV: no obvious cyanosis  MS: moves all visible extremities without noticeable abnormality  PSYCH/NEURO: pleasant and  cooperative, no obvious depression or anxiety, speech and thought processing grossly intact  ASSESSMENT AND PLAN:  Discussed the following assessment and plan:  Anticoagulant long-term use - Plan: CANCELED: CBC with Differential/Platelet  Pernicious anemia - Plan: CANCELED: Vitamin B12  Type 2 diabetes mellitus with diabetic neuropathy, without long-term current use of insulin (HCC) - Plan: CANCELED: Hemoglobin A1c, CANCELED: Comprehensive metabolic panel  Essential hypertension  Osteoarthritis, unspecified osteoarthritis type, unspecified site  History of colectomy  Educated About Covid-19 Virus Infection  Essential hypertension Well controlled on current regimen. Renal function assessment is due,  Orders sent to [facility] for testing                                                                                                                                                                                                             Lab Results  Component Value Date   CREATININE 0.89 04/12/2018   Lab Results  Component Value Date   NA 140 04/12/2018   K 3.4 (L) 04/12/2018   CL 104 04/12/2018   CO2 28 04/12/2018    Osteoarthritis She is trying to stay active with a daily walk , which is at times limited by fatigue due to deconditioning   Type 2 diabetes mellitus with diabetic neuropathy, unspecified ([place]) Improved control with less dietary indulgences.   Given her age,  No treatment unless a1c  is > 7.5.  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  She has chronic diarrhea managed with lomotil .  B12 level was < 300 in NOvember and she is supplementing    Educated About Covid-19 Virus Infection Educated patient on the newly broadened list of signs and symptoms of COVID-19 infection and ways to avoid the viral infection including washing hands frequently with soap and water,  using hand sanitizer if unable to wash, avoiding touching face,  staying at home and limiting visitors,  and avoiding contact with people coming in and out of home.  Discussed the potential ineffectiveness of hand sanitizer if left in environments > 110 degrees (ie , the car).  Reminded patient to call office with questions/concerns.  The importance of social distancing was discussed today  I discussed the assessment and treatment plan with the patient. The patient was provided an opportunity to ask questions and all were answered. The patient agreed with the plan and demonstrated an understanding of the instructions.   The patient was advised to call back or seek an in-person evaluation if the symptoms worsen or if the condition fails to improve as anticipated.  I provided 24 minutes of non-face-to-face time during this encounter.   Crecencio Mc, MD

## 2018-10-12 NOTE — Assessment & Plan Note (Signed)
Improved control with less dietary indulgences.   Given her age,  No treatment unless a1c is > 7.5.  Repeat assessment is due   Lab Results  Component Value Date   HGBA1C 6.6 (H) 04/12/2018   Lab Results  Component Value Date   MICROALBUR 0.9 04/12/2018

## 2018-10-12 NOTE — Assessment & Plan Note (Signed)
She is trying to stay active with a daily walk , which is at times limited by fatigue due to deconditioning

## 2018-10-13 ENCOUNTER — Telehealth: Payer: Self-pay

## 2018-10-13 DIAGNOSIS — I495 Sick sinus syndrome: Secondary | ICD-10-CM | POA: Diagnosis not present

## 2018-10-13 NOTE — Telephone Encounter (Signed)
Copied from [place] [id number]. Topic: General - Other >> Oct 13, 2018  1:47 PM Rainey Pines A wrote: Patient called to say that lab couldn't be done today due to the size of her veins and after several attempts it wasn't able to be performed.

## 2018-10-13 NOTE — Telephone Encounter (Signed)
FYI

## 2018-10-13 NOTE — Telephone Encounter (Signed)
INR from 10/12/2018 is 2.5. Pt is currently taking 1mg  daily.   Abstracted.

## 2018-10-13 NOTE — Telephone Encounter (Signed)
Spoke with pt and informed her of her INR results and that she should continue the 1mg  daily of coumadin. Pt gave a verbal understanding.

## 2018-10-13 NOTE — Telephone Encounter (Signed)
Coumadin level is therapeutic,  Continue current regimen and repeat PT/INR in one week 

## 2018-10-19 DIAGNOSIS — Z7901 Long term (current) use of anticoagulants: Secondary | ICD-10-CM | POA: Diagnosis not present

## 2018-10-19 LAB — POCT INR: INR: 2.4 — AB (ref <=1.1)

## 2018-10-22 ENCOUNTER — Ambulatory Visit: Payer: Medicare Other | Admitting: Internal Medicine

## 2018-10-22 ENCOUNTER — Telehealth: Payer: Self-pay

## 2018-10-22 ENCOUNTER — Ambulatory Visit: Payer: Medicare Other

## 2018-10-22 NOTE — Telephone Encounter (Signed)
Coumadin level is therapeutic,  Continue current regimen and repeat PT/INR in one week 

## 2018-10-22 NOTE — Telephone Encounter (Signed)
INR result from 10/19/2018 is 2.4. Pt is currently taking 1mg  daily.   Abstracted.

## 2018-10-25 NOTE — Telephone Encounter (Signed)
Spoke with pt and informed her of her INR result and that she should continue taking the 1mg  daily. Pt gave a verbal understanding.

## 2018-10-26 LAB — POCT INR: INR: 2.2 — AB (ref 0.9–1.1)

## 2018-10-27 ENCOUNTER — Other Ambulatory Visit: Payer: Self-pay

## 2018-10-27 ENCOUNTER — Ambulatory Visit (INDEPENDENT_AMBULATORY_CARE_PROVIDER_SITE_OTHER): Payer: Medicare Other

## 2018-10-27 ENCOUNTER — Ambulatory Visit: Payer: Medicare Other

## 2018-10-27 ENCOUNTER — Ambulatory Visit: Payer: Medicare Other | Admitting: Internal Medicine

## 2018-10-27 DIAGNOSIS — Z0000 Encounter for general adult medical examination without abnormal findings: Secondary | ICD-10-CM

## 2018-10-27 NOTE — Progress Notes (Signed)
Subjective:   Kara [name] is a 85 y.o. female who presents for Medicare Annual (Subsequent) preventive examination.  Review of Systems:  No ROS.  Medicare Wellness Virtual Visit.  Visual/audio telehealth visit, UTA vital signs.   See social history for additional risk factors.   Cardiac Risk Factors include: advanced age (>25men, >49 women);diabetes mellitus     Objective:     Vitals: There were no vitals taken for this visit.  There is no height or weight on file to calculate BMI.  Advanced Directives 10/27/2018 10/16/2017 11/11/2016 10/14/2016 02/12/2016 12/13/2015 12/12/2015  Does Patient Have a Medical Advance Directive? Yes Yes Yes Yes No Yes Yes  Type of Advance Directive Living will;Healthcare Power of Attorney Out of facility DNR (pink MOST or yellow form) [place];Living will [place];Living will - [place];Living will Living will;Healthcare Power of Attorney  Does patient want to make changes to medical advance directive? No - Patient declined No - Patient declined - No - Patient declined - No - Patient declined No - Patient declined  Copy of [place] in Chart? No - copy requested - - No - copy requested - - No - copy requested    Tobacco Social History   Tobacco Use  Smoking Status Former Smoker  Smokeless Tobacco Never Used  Tobacco Comment   quit 45 years ago     Counseling given: Not Answered Comment: quit 45 years ago   Clinical Intake:  Pre-visit preparation completed: Yes        Diabetes: Yes(Followed by pcp)  How often do you need to have someone help you when you read instructions, pamphlets, or other written materials from your doctor or pharmacy?: 2 - Rarely        Past Medical History:  Diagnosis Date  . Allergy   . Atrial fibrillation ([place])   . CAD (coronary artery disease)   . Cancer (HCC)    UTERINE  . Chronic anticoagulation   . Chronic diarrhea   .  Decubitus ulcer    sacral region  . Diabetes ([address])    diet controlled  . Dysuria   . Edema of lower extremity    mainly right foot, slightly in left foot.  . Fibrocystic breast disease   . GERD (gastroesophageal reflux disease)   . Glaucoma   . Glaucoma   . Hematuria   . Hemorrhoids   . History of colon polyps   . History of pancreatitis   . Hyperlipidemia   . Hypertension   . Hypokalemia   . IBS (irritable bowel syndrome)   . Microscopic hematuria   . Mitral valve regurgitation   . Osteoarthritis    fingers  . Pernicious anemia   . Plantar fasciitis   . Recurrent UTI   . Skin cancer   . Sleep apnea, obstructive   . Vaginal atrophy   . Vitamin D deficiency   . Yeast vaginitis    Past Surgical History:  Procedure Laterality Date  . ABDOMINAL HYSTERECTOMY  1980's  . ABDOMINAL SURGERY     for villous polyp,,,many years ago  . APPENDECTOMY  1940's  . ASCAD, s/p PTCA  11/28/2005   MID LESION   . BREAST BIOPSY Left 1970's  . CARPAL TUNNEL RELEASE Right 12/13/2015   Procedure: CARPAL TUNNEL RELEASE;  Surgeon: Thornton Park, MD;  Location: [facility] ORS;  Service: Orthopedics;  Laterality: Right;  . COLECTOMY  2015  . PACEMAKER PLACEMENT    .  radation     for uterine cance  . REFRACTIVE SURGERY     for bilateral glaumoma  . TONSILLECTOMY  1936   Family History  Problem Relation Age of Onset  . Coronary artery disease Father   . Kidney disease Father   . Cancer Sister   . Diabetes Other   . Cancer Mother        COLON  . Bladder Cancer Neg Hx   . Breast cancer Neg Hx    Social History   Socioeconomic History  . Marital status: Widowed    Spouse name: Not on file  . Number of children: 3  . Years of education: Not on file  . Highest education level: Not on file  Occupational History  . Occupation: Retired Restaurant manager, fast food - primary  Social Needs  . Financial resource strain: Not hard at all  . Food insecurity:    Worry: Never true    Inability: Never true   . Transportation needs:    Medical: No    Non-medical: No  Tobacco Use  . Smoking status: Former Research scientist (life sciences)  . Smokeless tobacco: Never Used  . Tobacco comment: quit 45 years ago  Substance and Sexual Activity  . Alcohol use: Yes    Alcohol/week: 0.0 standard drinks    Comment: Rarely, 1-2 times a year  . Drug use: No  . Sexual activity: Never  Lifestyle  . Physical activity:    Days per week: Not on file    Minutes per session: Not on file  . Stress: Not at all  Relationships  . Social connections:    Talks on phone: Not on file    Gets together: Not on file    Attends religious service: Not on file    Active member of club or organization: Not on file    Attends meetings of clubs or organizations: Not on file    Relationship status: Not on file  Other Topics Concern  . Not on file  Social History Narrative   Lives at [place]. Born in [place]. Travels frequently.      1 daughter, 2 step children      Regular Exercise -  NO   Daily Caffeine Use:  1 coffee             Outpatient Encounter Medications as of 10/27/2018  Medication Sig  . cholecalciferol (VITAMIN D) 1000 UNITS tablet Take 1,000 Units by mouth daily.  . cyanocobalamin (,VITAMIN B-12,) 1000 MCG/ML injection INJECT 1 ML  INTO THE MUSCLE EVERY 14 DAYS.  Marland Kitchen diltiazem (CARTIA XT) 180 MG 24 hr capsule Take 1 capsule (180 mg total) by mouth every morning.  . diphenoxylate-atropine (LOMOTIL) 2.5-0.025 MG tablet TAKE ONE TABLET BY MOUTH TWICE A DAY AS NEEDED FOR DIARRHEA  . estradiol (ESTRACE) 0.1 MG/GM vaginal cream Place 2 g vaginally at bedtime. For 2 weeks,  Then reduce dose to 1 gram every 3-4 days  . hydrocortisone-pramoxine (ANALPRAM-HC) 2.5-1 % rectal cream APPLY RECTALLY THREE TIMES DAILY  . Hypromellose (GENTEAL) 0.3 % SOLN Place 1 drop into both eyes daily as needed (dryness).   Marland Kitchen ketoconazole (NIZORAL) 2 % cream Apply 1 application topically daily as needed.   . metoprolol succinate  (TOPROL-XL) 25 MG 24 hr tablet Take 1 tablet (25 mg total) by mouth at bedtime.  . ondansetron (ZOFRAN) 4 MG tablet Take 1 tablet (4 mg total) by mouth every 8 (eight) hours as needed for nausea or vomiting.  Marland Kitchen  triamcinolone cream (KENALOG) 0.1 % Apply 1 application topically 2 (two) times daily. (Patient taking differently: Apply 1 application topically 2 (two) times daily as needed. )  . warfarin (COUMADIN) 1 MG tablet TAKE ONE TABLET BY MOUTH DAILY AT 6PM   No facility-administered encounter medications on file as of 10/27/2018.     Activities of Daily Living In your present state of health, do you have any difficulty performing the following activities: 10/27/2018  Hearing? N  Vision? N  Difficulty concentrating or making decisions? N  Walking or climbing stairs? Y  Comment Tires when climbing steps or walking long distances  Dressing or bathing? N  Doing errands, shopping? N  Preparing Food and eating ? N  Comment She does not cook; prepared meals. Meal prepared by facility.   Using the Toilet? N  In the past six months, have you accidently leaked urine? N  Do you have problems with loss of bowel control? N  Managing your Medications? N  Managing your Finances? N  Housekeeping or managing your Housekeeping? N  Comment Maid assist as needed  Some recent data might be hidden    Patient Care Team: Crecencio Mc, MD as PCP - General (Internal Medicine)    Assessment:   This is a routine wellness examination for [name].  I connected with patient 10/27/18 at 10:00 AM EDT by an audio enabled telemedicine application and verified that I am speaking with the correct person using two identifiers. Patient stated full name and DOB. Patient gave permission to continue with virtual visit. Patient's location was at home and Nurse's location was at [place] office.   Health Screenings  Mammogram - 01/2017 Colonoscopy - 09/2007 Bone Density - 08/2015 Glaucoma -none Hearing -demonstrates  normal hearing during visit. Labs followed by pcp Dental- every 6 months Vision- visits within the last 12 months.  Social  Alcohol intake - yes, rare      Smoking history- former   Smokers in home? none Illicit drug use? none Exercise - walking as tolerated around the pond or hallways daily. Diet - regular Sexually Active -never BMI- discussed the importance of a healthy diet, water intake and the benefits of aerobic exercise.  Educational material provided.   Safety  Patient feels safe at home- yes Patient does have smoke detectors at home- yes Patient does wear sunscreen or protective clothing when in direct sunlight -yes Patient does wear seat belt when in a moving vehicle -yes Life alert- yes Patient drives- yes  [id number] precautions and sickness symptoms discussed.   Activities of Daily Living Patient denies needing assistance with: driving, household chores, feeding themselves, getting from bed to chair, getting to the toilet, bathing/showering, dressing, managing money, or preparing meals.   No new identified risk were noted.    Depression Screen Patient denies losing interest in daily life, feeling hopeless, or crying easily over simple problems.   Medication-taking as directed and without issues.   Fall Screen Patient denies being afraid of falling or falling in the last year.   Memory Screen Patient is alert.  Patient denies difficulty focusing, concentrating or misplacing items. Correctly identified the president of the [place], season and states the months of the year in reverse.  Patient likes to read, play computer games, and complete cross word puzzles for brain stimulation.  Immunizations The following Immunizations were discussed: Influenza, shingles, pneumonia, and tetanus.   Other Providers Patient Care Team: Crecencio Mc, MD as PCP - General (Internal Medicine)  Exercise Activities  and Dietary recommendations Current Exercise Habits: Home  exercise routine, Type of exercise: stretching;walking, Frequency (Times/Week): 3, Intensity: Mild  Goals      Patient Stated   . DIET - REDUCE SUGAR INTAKE (pt-stated)     Monitor diet       Fall Risk Fall Risk  10/27/2018 04/13/2018 10/16/2017 12/31/2016 10/14/2016  Falls in the past year? 0 0 No No Yes  Number falls in past yr: - - - - 1  Injury with Fall? - - - - No  Comment - - - - -  Risk for fall due to : - Impaired mobility - - -  Follow up - - - - Falls prevention discussed;Education provided  Comment - - - - Foot slipped when sitting down   Depression Screen PHQ 2/9 Scores 10/27/2018 04/13/2018 10/16/2017 12/31/2016  PHQ - 2 Score 0 0 0 0  PHQ- 9 Score - - - -     Cognitive Function MMSE - Mini Mental State Exam 08/01/2015  Orientation to time 5  Orientation to Place 5  Registration 3  Attention/ Calculation 5  Recall 3  Language- name 2 objects 2  Language- repeat 1  Language- follow 3 step command 3  Language- read & follow direction 1  Write a sentence 1  Copy design 1  Total score 30     6CIT Screen 10/27/2018 10/16/2017 10/14/2016  What Year? 0 points 0 points 0 points  What month? 0 points 0 points 0 points  What time? 0 points 0 points 0 points  Count back from 20 0 points 0 points 0 points  Months in reverse 0 points 0 points 0 points  Repeat phrase 0 points - 0 points  Total Score 0 - 0    Immunization History  Administered Date(s) Administered  . Influenza Whole 02/28/2013  . Influenza,inj,Quad PF,6+ Mos 01/29/2015  . Influenza-Unspecified 02/19/2010, 03/17/2012, 03/02/2014, 02/09/2016, 01/25/2017, 03/12/2018  . Pneumococcal Conjugate-13 09/01/2013  . Pneumococcal Polysaccharide-23 04/07/2017  . Pneumococcal-Unspecified 04/15/2007  . Tdap 11/11/2011  . Zoster 08/31/2009, 10/12/2011   Screening Tests Health Maintenance  Topic Date Due  . OPHTHALMOLOGY EXAM  09/24/2018  . HEMOGLOBIN A1C  10/11/2018  . INFLUENZA VACCINE  12/18/2018  . URINE  MICROALBUMIN  04/13/2019  . FOOT EXAM  07/15/2019  . TETANUS/TDAP  11/10/2021  . DEXA SCAN  Completed  . PNA vac Low Risk Adult  Completed      Plan:    End of life planning; Advance aging; Advanced directives discussed.  Copy of current HCPOA/Living Will requested.    I have personally reviewed and noted the following in the patient's chart:   . Medical and social history . Use of alcohol, tobacco or illicit drugs  . Current medications and supplements . Functional ability and status . Nutritional status . Physical activity . Advanced directives . List of other physicians . Hospitalizations, surgeries, and ER visits in previous 12 months . Vitals . Screenings to include cognitive, depression, and falls . Referrals and appointments  In addition, I have reviewed and discussed with patient certain preventive protocols, quality metrics, and best practice recommendations. A written personalized care plan for preventive services as well as general preventive health recommendations were provided to patient.     OBrien-Blaney, Jathniel Smeltzer L, LPN  09/04/6220     I have reviewed the above information and agree with above.   Deborra Medina, MD

## 2018-10-27 NOTE — Patient Instructions (Addendum)
  Kara [name] , Thank you for taking time to come for your Medicare Wellness Visit. I appreciate your ongoing commitment to your health goals. Please review the following plan we discussed and let me know if I can assist you in the future.   These are the goals we discussed: Goals      Patient Stated   . DIET - REDUCE SUGAR INTAKE (pt-stated)     Monitor diet       This is a list of the screening recommended for you and due dates:  Health Maintenance  Topic Date Due  . Eye exam for diabetics  09/24/2018  . Hemoglobin A1C  10/11/2018  . Flu Shot  12/18/2018  . Urine Protein Check  04/13/2019  . Complete foot exam   07/15/2019  . Tetanus Vaccine  11/10/2021  . DEXA scan (bone density measurement)  Completed  . Pneumonia vaccines  Completed

## 2018-10-29 ENCOUNTER — Telehealth: Payer: Self-pay

## 2018-10-29 NOTE — Telephone Encounter (Signed)
INR from 10/26/2018 was 2.2. Pt is currently taking 1mg  daily.   Abstracted.

## 2018-10-30 NOTE — Telephone Encounter (Signed)
Coumadin level is therapeutic,  Continue current regimen and repeat PT/INR in one week 

## 2018-11-01 NOTE — Telephone Encounter (Signed)
Spoke with pt and informed her of her INR result and that she needs to continue 1mg  daily. Pt gave a verbal understanding.

## 2018-11-02 LAB — POCT INR: INR: 2.3 — AB (ref 0.9–1.1)

## 2018-11-04 ENCOUNTER — Telehealth: Payer: Self-pay

## 2018-11-04 NOTE — Telephone Encounter (Signed)
INR result from 11/02/2018 is 2.3. Pt is currently taking 1mg  daily.   Abstracted.

## 2018-11-04 NOTE — Telephone Encounter (Signed)
Coumadin level is therapeutic,  Continue current regimen and repeat PT/INR in one week 

## 2018-11-05 NOTE — Telephone Encounter (Signed)
Spoke with pt and informed her that she needs to continue taking the 1mg  coumadin daily. Pt gave a verbal understanding.

## 2018-11-09 LAB — POCT INR: INR: 2.3 — AB (ref 0.9–1.1)

## 2018-11-10 ENCOUNTER — Telehealth: Payer: Self-pay

## 2018-11-10 NOTE — Telephone Encounter (Signed)
Pt's INR result from 11/09/2018 is 2.3. Pt is currently taking 1mg  daily.   Abstracted.

## 2018-11-10 NOTE — Telephone Encounter (Signed)
Spoke with pt and informed her of her INR result and that she should continue her 1mg  daily.

## 2018-11-10 NOTE — Telephone Encounter (Signed)
Coumadin level is therapeutic,  Continue current regimen and repeat PT/INR in one week 

## 2018-11-16 DIAGNOSIS — Z7901 Long term (current) use of anticoagulants: Secondary | ICD-10-CM | POA: Diagnosis not present

## 2018-11-16 LAB — POCT INR: INR: 2.2 — AB (ref 0.9–1.1)

## 2018-11-17 ENCOUNTER — Telehealth: Payer: Self-pay

## 2018-11-17 NOTE — Telephone Encounter (Signed)
Coumadin level is therapeutic,  Continue current regimen and repeat PT/INR in one week 

## 2018-11-17 NOTE — Telephone Encounter (Signed)
INR result from 11/16/2018 is 2.2. Pt is currently taking 1mg  daily.   Abstracted

## 2018-11-17 NOTE — Telephone Encounter (Signed)
Spoke with pt and informed her that she needs to continue taking the 1mg  daily of coumadin. Pt gave a verbal understanding.

## 2018-11-23 ENCOUNTER — Other Ambulatory Visit: Payer: Self-pay | Admitting: Internal Medicine

## 2018-11-23 LAB — POCT INR: INR: 2 — AB (ref 0.9–1.1)

## 2018-11-23 MED ORDER — CYANOCOBALAMIN 1000 MCG/ML IJ SOLN: INTRAMUSCULAR | 0 refills | Status: DC

## 2018-11-23 NOTE — Telephone Encounter (Signed)
Sam, from pharmacy, called and is requesting a refill for pt on cyanocobalamin (,VITAMIN B-12,) 1000 MCG/ML injection. Sam states he is not able to request this electronically because the prescription is on a 30 day refill. Sam is requesting to have the refill set for every 14 days instead. Please advise.     [address]  [place] [address]  Phone: [phone] Fax: [phone]  Not a 24 hour pharmacy; exact hours not known.

## 2018-11-24 ENCOUNTER — Telehealth: Payer: Self-pay

## 2018-11-24 NOTE — Telephone Encounter (Signed)
Spoke with pt and informed her that she should continue taking 1mg  coumadin daily because her INR is therapeutic. Pt gave a verbal understanding.

## 2018-11-24 NOTE — Telephone Encounter (Signed)
INR result from 11/23/2018 is 2.0. Pt is currently taking 1mg  daily.   Abstracted.

## 2018-11-24 NOTE — Telephone Encounter (Signed)
Coumadin level is therapeutic,  Continue current regimen and repeat PT/INR in one week 

## 2018-11-30 ENCOUNTER — Other Ambulatory Visit: Payer: Self-pay

## 2018-11-30 ENCOUNTER — Ambulatory Visit (INDEPENDENT_AMBULATORY_CARE_PROVIDER_SITE_OTHER): Payer: Medicare Other | Admitting: Family Medicine

## 2018-11-30 ENCOUNTER — Ambulatory Visit (INDEPENDENT_AMBULATORY_CARE_PROVIDER_SITE_OTHER): Payer: Medicare Other | Admitting: Vascular Surgery

## 2018-11-30 ENCOUNTER — Encounter (INDEPENDENT_AMBULATORY_CARE_PROVIDER_SITE_OTHER): Payer: Self-pay

## 2018-11-30 ENCOUNTER — Ambulatory Visit (INDEPENDENT_AMBULATORY_CARE_PROVIDER_SITE_OTHER): Payer: Medicare Other

## 2018-11-30 ENCOUNTER — Encounter: Payer: Self-pay | Admitting: Family Medicine

## 2018-11-30 DIAGNOSIS — I739 Peripheral vascular disease, unspecified: Secondary | ICD-10-CM

## 2018-11-30 DIAGNOSIS — N390 Urinary tract infection, site not specified: Secondary | ICD-10-CM

## 2018-11-30 DIAGNOSIS — N898 Other specified noninflammatory disorders of vagina: Secondary | ICD-10-CM | POA: Diagnosis not present

## 2018-11-30 LAB — POCT INR: INR: 1.9 — AB (ref 0.9–1.1)

## 2018-11-30 MED ORDER — FLUCONAZOLE 150 MG PO TABS: 150.0000 mg | ORAL_TABLET | Freq: Once | ORAL | 0 refills | Status: AC

## 2018-11-30 MED ORDER — CEPHALEXIN 500 MG PO CAPS: 500.0000 mg | ORAL_CAPSULE | Freq: Two times a day (BID) | ORAL | 0 refills | Status: DC

## 2018-11-30 NOTE — Progress Notes (Signed)
Patient went home following noninvasive studies.  To be called with results

## 2018-11-30 NOTE — Progress Notes (Signed)
Patient ID: Kara [name], female   DOB: 1922-06-10, 85 y.o.   MRN: [id number]    Virtual Visit via phone Note  This visit type was conducted due to national recommendations for restrictions regarding the COVID-19 pandemic (e.g. social distancing).  This format is felt to be most appropriate for this patient at this time.  All issues noted in this document were discussed and addressed.  No physical exam was performed (except for noted visual exam findings with Video Visits).   I connected with Laurence Ferrari today at  3:40 PM EDT by a video enabled telemedicine application or telephone and verified that I am speaking with the correct person using two identifiers. Location patient: home Location provider: work or home office Persons participating in the virtual visit: patient, provider  I discussed the limitations, risks, security and privacy concerns of performing an evaluation and management service by telephone and the availability of in person appointments. I also discussed with the patient that there may be a patient responsible charge related to this service. The patient expressed understanding and agreed to proceed.  HPI:  Patient and I connected via telephone due to complaint of burning, pressure and foul odor while urinating for the past 2 to 3 days.  Patient denies seeing any visible blood in the urine.  Does have some vaginal itching as well.  Vaginal itching is not a new issue, she does use the Premarin vaginal cream which helps calm the vaginal itching symptom.  Patient is concerned about possibility of UTI due to burning with urination, pressure and foul smell.  She has been trying to increase her water intake.  Denies any fever or chills.  No nausea, vomiting or diarrhea.  Denies any body aches.  She lives at the [facility] and they are on lockdown, has not gone anywhere that could have potentially expose her to COVID-19.  ROS: See pertinent positives and negatives per HPI.   Past Medical History:  Diagnosis Date  . Allergy   . Atrial fibrillation ([place])   . CAD (coronary artery disease)   . Cancer (HCC)    UTERINE  . Chronic anticoagulation   . Chronic diarrhea   . Decubitus ulcer    sacral region  . Diabetes ([place])    diet controlled  . Dysuria   . Edema of lower extremity    mainly right foot, slightly in left foot.  . Fibrocystic breast disease   . GERD (gastroesophageal reflux disease)   . Glaucoma   . Glaucoma   . Hematuria   . Hemorrhoids   . History of colon polyps   . History of pancreatitis   . Hyperlipidemia   . Hypertension   . Hypokalemia   . IBS (irritable bowel syndrome)   . Microscopic hematuria   . Mitral valve regurgitation   . Osteoarthritis    fingers  . Pernicious anemia   . Plantar fasciitis   . Recurrent UTI   . Skin cancer   . Sleep apnea, obstructive   . Vaginal atrophy   . Vitamin D deficiency   . Yeast vaginitis     Past Surgical History:  Procedure Laterality Date  . ABDOMINAL HYSTERECTOMY  1980's  . ABDOMINAL SURGERY     for villous polyp,,,many years ago  . APPENDECTOMY  1940's  . ASCAD, s/p PTCA  11/28/2005   MID LESION   . BREAST BIOPSY Left 1970's  . CARPAL TUNNEL RELEASE Right 12/13/2015   Procedure: CARPAL TUNNEL RELEASE;  Surgeon: Thornton Park, MD;  Location: [facility] ORS;  Service: Orthopedics;  Laterality: Right;  . COLECTOMY  2015  . PACEMAKER PLACEMENT    . radation     for uterine cance  . REFRACTIVE SURGERY     for bilateral glaumoma  . TONSILLECTOMY  1936    Family History  Problem Relation Age of Onset  . Coronary artery disease Father   . Kidney disease Father   . Cancer Sister   . Diabetes Other   . Cancer Mother        COLON  . Bladder Cancer Neg Hx   . Breast cancer Neg Hx    Social History   Tobacco Use  . Smoking status: Former Research scientist (life sciences)  . Smokeless tobacco: Never Used  . Tobacco comment: quit 45 years ago  Substance Use Topics  . Alcohol use: Yes    Alcohol/week:  0.0 standard drinks    Comment: Rarely, 1-2 times a year    Current Outpatient Medications:  .  cholecalciferol (VITAMIN D) 1000 UNITS tablet, Take 1,000 Units by mouth daily., Disp: , Rfl:  .  cyanocobalamin (,VITAMIN B-12,) 1000 MCG/ML injection, INJECT 1 ML  INTO THE MUSCLE EVERY 14 DAYS., Disp: 10 mL, Rfl: 0 .  diltiazem (CARTIA XT) 180 MG 24 hr capsule, Take 1 capsule (180 mg total) by mouth every morning., Disp: 90 capsule, Rfl: 1 .  diphenoxylate-atropine (LOMOTIL) 2.5-0.025 MG tablet, TAKE ONE TABLET BY MOUTH TWICE A DAY AS NEEDED FOR DIARRHEA, Disp: 60 tablet, Rfl: 0 .  estradiol (ESTRACE) 0.1 MG/GM vaginal cream, Place 2 g vaginally at bedtime. For 2 weeks,  Then reduce dose to 1 gram every 3-4 days, Disp: 42.5 g, Rfl: 11 .  hydrocortisone-pramoxine (ANALPRAM-HC) 2.5-1 % rectal cream, APPLY RECTALLY THREE TIMES DAILY, Disp: 30 g, Rfl: 0 .  Hypromellose (GENTEAL) 0.3 % SOLN, Place 1 drop into both eyes daily as needed (dryness). , Disp: , Rfl:  .  ketoconazole (NIZORAL) 2 % cream, Apply 1 application topically daily as needed. , Disp: , Rfl: 1 .  metoprolol succinate (TOPROL-XL) 25 MG 24 hr tablet, Take 1 tablet (25 mg total) by mouth at bedtime., Disp: 90 tablet, Rfl: 1 .  ondansetron (ZOFRAN) 4 MG tablet, Take 1 tablet (4 mg total) by mouth every 8 (eight) hours as needed for nausea or vomiting., Disp: 30 tablet, Rfl: 0 .  triamcinolone cream (KENALOG) 0.1 %, Apply 1 application topically 2 (two) times daily. (Patient taking differently: Apply 1 application topically 2 (two) times daily as needed. ), Disp: 30 g, Rfl: 0 .  warfarin (COUMADIN) 1 MG tablet, TAKE ONE TABLET BY MOUTH DAILY AT 6PM, Disp: 90 tablet, Rfl: 1  EXAM:  GENERAL: alert, oriented, sounds well and in no acute distress  LUNGS: Speaking in full sentences, no signs of respiratory distress, breathing rate appears normal, no obvious gross SOB, gasping or wheezing  PSYCH/NEURO: pleasant and cooperative, no obvious  depression or anxiety, speech and thought processing grossly intact  ASSESSMENT AND PLAN:  Discussed the following assessment and plan:  UTI/vaginal itch- due to patient's symptoms we will treat as UTI.  She will take Keflex twice daily for 5 days and has been advised to increase water intake, avoid excess sugary caffeinated beverages, wear cotton underwear and always wipe front to back after using restroom.  Patient states she often gets a yeast infection with antibiotics, so I have sent in a one-time dose of Diflucan to use for treatment of yeast infection.  I suspect the vaginal itching symptom is a chronic issue from a vaginal dryness and patient has been encouraged to continue Premarin cream as prescribed.  Advised that if her symptoms persist into next week, we at that point we will request she come into clinic and drop off a urine sample so we can send the lab for testing.    I discussed the assessment and treatment plan with the patient. The patient was provided an opportunity to ask questions and all were answered. The patient agreed with the plan and demonstrated an understanding of the instructions.   The patient was advised to call back or seek an in-person evaluation if the symptoms worsen or if the condition fails to improve as anticipated.  I provided 12 minutes of non-face-to-face time during this encounter.   Jodelle Green, FNP

## 2018-12-01 ENCOUNTER — Telehealth: Payer: Self-pay

## 2018-12-01 NOTE — Telephone Encounter (Signed)
Coumadin level is therapeutic,  Continue current regimen and repeat PT/INR in one week 

## 2018-12-01 NOTE — Telephone Encounter (Signed)
Spoke with pt and informed her that her INR is therapeutic and that she should continue taking the 1mg  daily. Pt gave a verbal understanding.

## 2018-12-01 NOTE — Telephone Encounter (Signed)
INR result from 11/30/2018 is 1.9. Pt is currently taking 1mg  daily.   Abstracted.

## 2018-12-07 ENCOUNTER — Other Ambulatory Visit (INDEPENDENT_AMBULATORY_CARE_PROVIDER_SITE_OTHER): Payer: Self-pay | Admitting: Vascular Surgery

## 2018-12-07 ENCOUNTER — Encounter: Payer: Self-pay | Admitting: Family

## 2018-12-07 DIAGNOSIS — I739 Peripheral vascular disease, unspecified: Secondary | ICD-10-CM

## 2018-12-07 LAB — PROTIME-INR

## 2018-12-08 ENCOUNTER — Encounter (INDEPENDENT_AMBULATORY_CARE_PROVIDER_SITE_OTHER): Payer: Self-pay | Admitting: Vascular Surgery

## 2018-12-09 ENCOUNTER — Telehealth: Payer: Self-pay | Admitting: Internal Medicine

## 2018-12-09 DIAGNOSIS — N898 Other specified noninflammatory disorders of vagina: Secondary | ICD-10-CM

## 2018-12-09 DIAGNOSIS — R300 Dysuria: Secondary | ICD-10-CM

## 2018-12-09 MED ORDER — FLUCONAZOLE 150 MG PO TABS: 150.0000 mg | ORAL_TABLET | Freq: Once | ORAL | 0 refills | Status: AC

## 2018-12-09 NOTE — Telephone Encounter (Signed)
Pt said that she saw Philis Nettle, FNP last week for UTI.  Pt said that she took antibiotic on 7/14 and finished on Sunday morning.  Pt said that she was better but had a little discomfort.  Pt said that she took the yeast pill on 7/21 and since she still has some discomfort and itching.  Pt said that the itching is intense.  Pt said that the itching is in the inner part of the vagina and some burning when urinating.  Patient said that she has white, grainy discharge.  No fever, no noticeable blood in urine.  Pt said that medicine has not cleared up symptoms.  Pt prefers [facility] if provider decides to call in a new medication.

## 2018-12-09 NOTE — Telephone Encounter (Signed)
The itching and discomfort could be from dryness and since antibiotic course and Diflucan did not help treat I would prefer patient bring in a urine sample before we just send in another antibiotic.  The discharge could be from yeast that has not fully cleared up.  I will send in 1 more Diflucan to see if that helps.  I strongly strongly encourage patient to bring in urine sample so we can send out to lab for testing  I will place orders for urinalysis and urine culture so we can best treat.

## 2018-12-09 NOTE — Telephone Encounter (Signed)
Called Pt to tell her of the Rx for Diflucan sent to the pharmacy.Pt wants to know if RN Aaron Edelman of [facility] can collect the urine culture and urinalysis.

## 2018-12-09 NOTE — Telephone Encounter (Signed)
Yes he can if he is able

## 2018-12-09 NOTE — Telephone Encounter (Signed)
Copied from [place] [id number]. Topic: General - Other >> Dec 09, 2018 12:41 PM Keene Breath wrote: Reason for CRM: Patient would like the nurse or doctor to call her about a UTI.  She has some questions and would like a call back as soon as possible today.  CB# [phone]

## 2018-12-10 ENCOUNTER — Telehealth: Payer: Self-pay | Admitting: Family

## 2018-12-10 ENCOUNTER — Other Ambulatory Visit (INDEPENDENT_AMBULATORY_CARE_PROVIDER_SITE_OTHER): Payer: Medicare Other

## 2018-12-10 ENCOUNTER — Other Ambulatory Visit: Payer: Self-pay

## 2018-12-10 ENCOUNTER — Other Ambulatory Visit: Payer: Self-pay | Admitting: Family Medicine

## 2018-12-10 DIAGNOSIS — N898 Other specified noninflammatory disorders of vagina: Secondary | ICD-10-CM

## 2018-12-10 DIAGNOSIS — N3000 Acute cystitis without hematuria: Secondary | ICD-10-CM

## 2018-12-10 DIAGNOSIS — R300 Dysuria: Secondary | ICD-10-CM

## 2018-12-10 LAB — URINALYSIS, ROUTINE W REFLEX MICROSCOPIC
Bilirubin Urine: NEGATIVE
Ketones, ur: NEGATIVE
Nitrite: NEGATIVE
Specific Gravity, Urine: 1.015 (ref 1.000–1.030)
Urine Glucose: NEGATIVE
Urobilinogen, UA: 0.2 (ref 0.0–1.0)
pH: 6.5 (ref 5.0–8.0)

## 2018-12-10 MED ORDER — CIPROFLOXACIN HCL 250 MG PO TABS: 250.0000 mg | ORAL_TABLET | Freq: Two times a day (BID) | ORAL | 0 refills | Status: DC

## 2018-12-10 NOTE — Telephone Encounter (Signed)
Call pt  Per Dr Derrel Nip, in note regarding her INR.   'Given her age,  I have elected to continue 1 mg daily due to her recurrent fluctuations into the suprtherpeutic range with dose adjusments.'  Her INR is subtherapeutic 1.7.   Per Dr Lupita Dawn note, I would advise her to stay on 1mg  coumadin.   Does patient feel this is reasonable?  Did she miss a dose?  Any other symptoms? Ensure no CP, sob.   Gifford Shave

## 2018-12-10 NOTE — Telephone Encounter (Signed)
I have notified patient & she verbalized understanding. She stated that she also had an antibiotic last week so she thought that probably effected the result. Kara [name] has been mad aware.

## 2018-12-10 NOTE — Telephone Encounter (Signed)
Called Rn Aaron Edelman at [facility] and gave him a verbal order for a Urine culture and urinalysis for the Pt.

## 2018-12-10 NOTE — Telephone Encounter (Signed)
Thanks Joycelyn Schmid!  I haven't changed her dose in a year !

## 2018-12-10 NOTE — Telephone Encounter (Signed)
I tried to call patient twice, but line has been busy.

## 2018-12-12 LAB — URINE CULTURE

## 2018-12-14 ENCOUNTER — Encounter: Payer: Self-pay | Admitting: Internal Medicine

## 2018-12-14 ENCOUNTER — Telehealth: Payer: Self-pay

## 2018-12-14 DIAGNOSIS — Z7901 Long term (current) use of anticoagulants: Secondary | ICD-10-CM | POA: Diagnosis not present

## 2018-12-14 LAB — POCT INR

## 2018-12-14 NOTE — Telephone Encounter (Signed)
Copied from [place] [phone]. Topic: General - Other >> Dec 14, 2018 11:46 AM Burchel, Abbi R wrote: Reason for CRM: Pt requesting call back from Philis Nettle re: recent lab/urinalysis

## 2018-12-14 NOTE — Telephone Encounter (Signed)
Called Pt and told her what NP Philis Nettle stated, Pt stated okay she understood. Pt stated she will call us back in a couple days if anything changes and doesn't get better.

## 2018-12-14 NOTE — Telephone Encounter (Signed)
Called Pt and told her what NP Philis Nettle stated. Pt stated she thinks its yeast because she keeps accumulating white stuff around her vaginal area but not a lot, she also stated the itching is not how it use to be just a little.

## 2018-12-14 NOTE — Telephone Encounter (Signed)
Called Pt and she stated she is still feeling some itching in her vaginal area. Pt also stated she has  completely taken all of her antiobiotics

## 2018-12-14 NOTE — Telephone Encounter (Signed)
Pt called [facility] Reason for CRM: Pt requesting call back from Philis Nettle re: recent lab/urinalysis

## 2018-12-14 NOTE — Telephone Encounter (Signed)
She has taken 2 dose of diflucan in the past 10 days and that usually will treat yeast.   If things do not get better, she will need pelvic exam

## 2018-12-14 NOTE — Telephone Encounter (Signed)
Does she have specific question?  Cipro showed to kill UTI bacteria on culture  If needs phone visit we can schedule that

## 2018-12-14 NOTE — Telephone Encounter (Signed)
Looking back in her chart, it seems some itching in vaginal area is chronic issue and could be related to atrophic vaginitis (which is vaginal dryness)  Please be sure she is using Estrace vaginal cream prescribed by Dr Derrel Nip and if her symptoms persist she will need Pelvic exam here in our office or referral to GYN for pelvic exam.   If her symptoms worsen -- develops ABD pain, fever, pain with urinationation, nausea -- let us know and at that point we may need to re-test urine.  I do not think another antibiotic is needed at this time.

## 2018-12-15 ENCOUNTER — Other Ambulatory Visit: Payer: Self-pay

## 2018-12-15 ENCOUNTER — Telehealth: Payer: Self-pay

## 2018-12-15 MED ORDER — ESTRADIOL 0.1 MG/GM VA CREA: 2.0000 g | TOPICAL_CREAM | Freq: Every day | VAGINAL | 5 refills | Status: DC

## 2018-12-15 NOTE — Telephone Encounter (Signed)
Spoke with pt and informed her of her INR result and that she needs to continue taking the 1mg  daily. Pt gave a verbal understanding.

## 2018-12-15 NOTE — Telephone Encounter (Signed)
.  Coumadin level is therapeutic,  Continue current regimen and repeat PT/INR in one week 

## 2018-12-15 NOTE — Telephone Encounter (Signed)
Pt's INR from 12/14/2018 was 2.3. Pt is currently taking 1mg  daily.

## 2018-12-20 ENCOUNTER — Telehealth: Payer: Self-pay

## 2018-12-20 NOTE — Telephone Encounter (Signed)
Called and spoke to pt.  Pt c/o ongoing vaginal itchiness and having more loose stools than normal.  Pt said that stool was darker than usual with mucus present.  Pt also said that it is more difficult to hold bowel movement.  Pt said that she has been on antibiotics for a UTI.  Pt said that she wiped a little blood this morning but not since.  Pt said that she doesn't feel well.  Offered pt a same day appt w/ FNP today however, pt said that she preferred to see her PCP.  Informed pt that PCP's next available appt was not until Friday.  Pt wants to be advised by Dr. Derrel Nip if she should wait until then or be scheduled to see the FNP.  Pt was scheduled appt with PCP on 12/24/18 @ 4:30 per pt request.  Pt informed that notes will be forwarded to Dr. Derrel Nip for advisement on appt.

## 2018-12-20 NOTE — Telephone Encounter (Signed)
If she is not having fever, ,  And stools are not causing incontinence,  And she has not seen any more blood in stools,  She should start a probiotic and can wait to see me.

## 2018-12-20 NOTE — Telephone Encounter (Signed)
LMTCB. PEC may speak with pt.  

## 2018-12-20 NOTE — Telephone Encounter (Signed)
Copied from [place] [id number]. Topic: General - Other >> Dec 20, 2018 11:21 AM Parke Poisson wrote: Reason for CRM: Pt states that she is having cloudy, orange looking urine and has seen a spot of blood after she wiped.She is still having some vaginal itching and not feeling well.Pl advise

## 2018-12-20 NOTE — Telephone Encounter (Signed)
Called to triage pt and to schedule appt.  Line busy.  Unable to leave a message.

## 2018-12-20 NOTE — Telephone Encounter (Signed)
Copied from [place] [id number]. Topic: General - Other >> Dec 20, 2018 11:21 AM Parke Poisson wrote: Reason for CRM: Pt states that she is having cloudy, orange looking urine and has seen a spot of blood after she wiped.She is still having some vaginal itching and not feeling well.Pl advise

## 2018-12-21 ENCOUNTER — Encounter: Payer: Self-pay | Admitting: Family

## 2018-12-21 ENCOUNTER — Telehealth: Payer: Self-pay | Admitting: Internal Medicine

## 2018-12-21 LAB — POCT INR: INR: 1.6 — AB (ref 2.0–3.0)

## 2018-12-21 NOTE — Telephone Encounter (Signed)
Kara [name] with [facility] called in to report pt's out of range INR to PCP --  1.6

## 2018-12-21 NOTE — Telephone Encounter (Signed)
Spoke with pt and she stated that the she is taking the probiotic.

## 2018-12-21 NOTE — Telephone Encounter (Signed)
Continue current regimen and repeat PT/INR in one week

## 2018-12-21 NOTE — Telephone Encounter (Signed)
Spoke with pt and informed her that she needs to continue taking the 1mg  daily. Pt gave a verbal understanding.

## 2018-12-23 ENCOUNTER — Other Ambulatory Visit: Payer: Self-pay | Admitting: Internal Medicine

## 2018-12-23 ENCOUNTER — Telehealth: Payer: Self-pay | Admitting: Internal Medicine

## 2018-12-23 DIAGNOSIS — I4891 Unspecified atrial fibrillation: Secondary | ICD-10-CM

## 2018-12-23 NOTE — Telephone Encounter (Signed)
Spoke with Ebony Hail from [facility] and she stated that the brand name Coumadin is not being made any more and the pt is uncomfortable switching to the generic warfin. Ebony Hail stated that she is going to have to switch but was wanting to see if we could have her check her INR an extra time or two during the transition to warfin just to make the pt feel more comfortable about having to switch.

## 2018-12-23 NOTE — Telephone Encounter (Signed)
Copied from [place] [id number]. Topic: General - Other >> Dec 23, 2018  1:29 PM Keene Breath wrote: Reason for CRM: Pharmacy is calling to get clarification on the patient's medication for Coumadin.    CB # [phone]

## 2018-12-23 NOTE — Telephone Encounter (Signed)
She is already checking it weekly! It does not need to be checked more frequently than that

## 2018-12-23 NOTE — Telephone Encounter (Signed)
Left message with pharmacy to have [facility] give [place] a call back in regards to pt's coumadin.

## 2018-12-24 ENCOUNTER — Ambulatory Visit (INDEPENDENT_AMBULATORY_CARE_PROVIDER_SITE_OTHER): Payer: Medicare Other | Admitting: Internal Medicine

## 2018-12-24 ENCOUNTER — Other Ambulatory Visit: Payer: Self-pay

## 2018-12-24 ENCOUNTER — Encounter: Payer: Self-pay | Admitting: Internal Medicine

## 2018-12-24 DIAGNOSIS — I4891 Unspecified atrial fibrillation: Secondary | ICD-10-CM

## 2018-12-24 DIAGNOSIS — N760 Acute vaginitis: Secondary | ICD-10-CM | POA: Diagnosis not present

## 2018-12-24 DIAGNOSIS — I739 Peripheral vascular disease, unspecified: Secondary | ICD-10-CM

## 2018-12-24 MED ORDER — CLOBETASOL PROPIONATE 0.05 % EX CREA: 1.0000 "application " | TOPICAL_CREAM | Freq: Two times a day (BID) | CUTANEOUS | 0 refills | Status: DC

## 2018-12-24 NOTE — Telephone Encounter (Signed)
Spoke with pt about this during her visit today.

## 2018-12-24 NOTE — Progress Notes (Signed)
Telephone Note  This visit type was conducted due to national recommendations for restrictions regarding the COVID-19 pandemic (e.g. social distancing).  This format is felt to be most appropriate for this patient at this time.  All issues noted in this document were discussed and addressed.  No physical exam was performed (except for noted visual exam findings with Video Visits).   I connected with@ on 12/24/18 at  4:30 PM EDT by a video enabled telemedicine application or telephone and verified that I am speaking with the correct person using two identifiers. Location patient: home Location provider: work or home office Persons participating in the virtual visit: patient, provider  I discussed the limitations, risks, security and privacy concerns of performing an evaluation and management service by telephone and the availability of in person appointments. I also discussed with the patient that there may be a patient responsible charge related to this service. The patient expressed understanding and agreed to proceed.   Reason for visit: vaginal itching   HPI:  1) vaginal itching  After 2 rounds of fluconazole. Started after treatment for UTI Klebsiella .  Still using estrace dabbed with finger on vagina introitus   2) warfarin/coumadin no longer available wants to know if safe to  change  To an aternative drug .  She  has chronic nonvalvular  atrial fib but has PAD on the lowe extremities, which is stable but symptomatic on the left.     3) [facility] change  Diltiazem capsules  too large    ROS: See pertinent positives and negatives per HPI.  Past Medical History:  Diagnosis Date  . Allergy   . Atrial fibrillation ([place])   . CAD (coronary artery disease)   . Cancer ([facility])    UTERINE  . Chronic anticoagulation   . Chronic diarrhea   . Decubitus ulcer    sacral region  . Diabetes ([place])    diet controlled  . Dysuria   . Edema of lower extremity    mainly right foot,  slightly in left foot.  . Fibrocystic breast disease   . GERD (gastroesophageal reflux disease)   . Glaucoma   . Glaucoma   . Hematuria   . Hemorrhoids   . History of colon polyps   . History of pancreatitis   . Hyperlipidemia   . Hypertension   . Hypokalemia   . IBS (irritable bowel syndrome)   . Microscopic hematuria   . Mitral valve regurgitation   . Osteoarthritis    fingers  . Pernicious anemia   . Plantar fasciitis   . Recurrent UTI   . Skin cancer   . Sleep apnea, obstructive   . Vaginal atrophy   . Vitamin D deficiency   . Yeast vaginitis     Past Surgical History:  Procedure Laterality Date  . ABDOMINAL HYSTERECTOMY  1980's  . ABDOMINAL SURGERY     for villous polyp,,,many years ago  . APPENDECTOMY  1940's  . ASCAD, s/p PTCA  11/28/2005   MID LESION   . BREAST BIOPSY Left 1970's  . CARPAL TUNNEL RELEASE Right 12/13/2015   Procedure: CARPAL TUNNEL RELEASE;  Surgeon: Thornton Park, MD;  Location: [facility] ORS;  Service: Orthopedics;  Laterality: Right;  . COLECTOMY  2015  . PACEMAKER PLACEMENT    . radation     for uterine cance  . REFRACTIVE SURGERY     for bilateral glaumoma  . TONSILLECTOMY  1936    Family History  Problem Relation Age  of Onset  . Coronary artery disease Father   . Kidney disease Father   . Cancer Sister   . Diabetes Other   . Cancer Mother        COLON  . Bladder Cancer Neg Hx   . Breast cancer Neg Hx     SOCIAL HX: reports that she has quit smoking. She has never used smokeless tobacco. She reports current alcohol use. She reports that she does not use drugs.  Current Outpatient Medications:  .  cholecalciferol (VITAMIN D) 1000 UNITS tablet, Take 1,000 Units by mouth daily., Disp: , Rfl:  .  ciprofloxacin (CIPRO) 250 MG tablet, Take 1 tablet (250 mg total) by mouth 2 (two) times daily., Disp: 6 tablet, Rfl: 0 .  cyanocobalamin (,VITAMIN B-12,) 1000 MCG/ML injection, INJECT 1 ML  INTO THE MUSCLE EVERY 14 DAYS., Disp: 10 mL, Rfl:  0 .  diltiazem (CARDIZEM CD) 180 MG 24 hr capsule, TAKE 1 CAPSULE BY MOUTH EVERY MORNING, Disp: 90 capsule, Rfl: 1 .  diphenoxylate-atropine (LOMOTIL) 2.5-0.025 MG tablet, TAKE ONE TABLET BY MOUTH TWICE A DAY AS NEEDED FOR DIARRHEA, Disp: 60 tablet, Rfl: 0 .  estradiol (ESTRACE) 0.1 MG/GM vaginal cream, Place 2 g vaginally at bedtime. For 2 weeks,  Then reduce dose to 1 gram every 3-4 days, Disp: 42.5 g, Rfl: 5 .  hydrocortisone-pramoxine (ANALPRAM-HC) 2.5-1 % rectal cream, APPLY RECTALLY THREE TIMES DAILY, Disp: 30 g, Rfl: 0 .  Hypromellose (GENTEAL) 0.3 % SOLN, Place 1 drop into both eyes daily as needed (dryness). , Disp: , Rfl:  .  ketoconazole (NIZORAL) 2 % cream, Apply 1 application topically daily as needed. , Disp: , Rfl: 1 .  metoprolol succinate (TOPROL-XL) 25 MG 24 hr tablet, Take 1 tablet (25 mg total) by mouth at bedtime., Disp: 90 tablet, Rfl: 1 .  ondansetron (ZOFRAN) 4 MG tablet, Take 1 tablet (4 mg total) by mouth every 8 (eight) hours as needed for nausea or vomiting., Disp: 30 tablet, Rfl: 0 .  triamcinolone cream (KENALOG) 0.1 %, Apply 1 application topically 2 (two) times daily. (Patient taking differently: Apply 1 application topically 2 (two) times daily as needed. ), Disp: 30 g, Rfl: 0 .  aspirin EC 81 MG tablet, Take 1 tablet (81 mg total) by mouth daily., Disp: 90 tablet, Rfl: 1 .  clobetasol cream (TEMOVATE) 1.61 %, Apply 1 application topically 2 (two) times daily., Disp: 30 g, Rfl: 0 .  Rivaroxaban (XARELTO) 15 MG TABS tablet, Take 1 tablet (15 mg total) by mouth daily with supper., Disp: 30 tablet, Rfl: 2  EXAM:   General impression: alert, cooperative and articulate.  No signs of being in distress  Lungs: speech is fluent sentence length suggests that patient is not short of breath and not punctuated by cough, sneezing or sniffing. Marland Kitchen   Psych: affect normal.  speech is articulate and non pressured .  Denies suicidal thoughts   ASSESSMENT AND PLAN:  Peripheral  artery disease ([place]) Starting aspirin , since we are stopping coumadin and starting DOAC for atrial fibrillation risk of embolic stoke mitigation   Atrial fibrillation ([place]) Managed with diltiazem  .  Per patient request changing anticoagulation to DOCA since NB coumadin is no longer available.   Vaginitis and vulvovaginitis Steroid cream prescribed, as she has already taken 2 rounds of fluconazole and uses estrace    I discussed the assessment and treatment plan with the patient. The patient was provided an opportunity to ask questions and all  were answered. The patient agreed with the plan and demonstrated an understanding of the instructions.   The patient was advised to call back or seek an in-person evaluation if the symptoms worsen or if the condition fails to improve as anticipated.  I provided 25 minutes of non-face-to-face time during this encounter.   Crecencio Mc, MD

## 2018-12-26 DIAGNOSIS — N760 Acute vaginitis: Secondary | ICD-10-CM | POA: Insufficient documentation

## 2018-12-26 MED ORDER — RIVAROXABAN 15 MG PO TABS: 15.0000 mg | ORAL_TABLET | Freq: Every day | ORAL | 2 refills | Status: DC

## 2018-12-26 MED ORDER — ASPIRIN EC 81 MG PO TBEC: 81.0000 mg | DELAYED_RELEASE_TABLET | Freq: Every day | ORAL | 1 refills | Status: DC

## 2018-12-26 NOTE — Assessment & Plan Note (Signed)
Starting aspirin , since we are stopping coumadin and starting DOAC for atrial fibrillation risk of embolic stoke mitigation

## 2018-12-26 NOTE — Telephone Encounter (Signed)
I have spoken with her cardiologist Dr Nehemiah Massed about changing her coumadin to Xarelto,  And he agrees.  She needs to take it with food at dinner time and finish whatever pills of coumadin she has left  But they do not need to overlap more than 1-2 days.  She will also need to start taking a baby aspirin daily    [facility]

## 2018-12-26 NOTE — Assessment & Plan Note (Addendum)
Managed with diltiazem  .  Per patient request changing anticoagulation to DOCA since NB coumadin is no longer available.

## 2018-12-26 NOTE — Assessment & Plan Note (Signed)
Steroid cream prescribed, as she has already taken 2 rounds of fluconazole and uses estrace

## 2018-12-27 NOTE — Telephone Encounter (Signed)
Spoke with pt and informed her of Dr. Lupita Dawn medication change and the directions of use. Pt gave a verbal understanding.

## 2018-12-29 ENCOUNTER — Telehealth: Payer: Self-pay

## 2018-12-29 NOTE — Telephone Encounter (Signed)
Would you like for her to have schedule an a appt.

## 2018-12-29 NOTE — Telephone Encounter (Signed)
Copied from [place] [id number]. Topic: General - Other >> Dec 29, 2018  3:45 PM Mcneil, Ja-Kwan wrote: Reason for CRM: Pt stated she has been speaking with her heart doctor about both the Rivaroxaban (XARELTO) 15 MG TABS tablet and Eliquis and she would also like to talk with Dr. Derrel Nip because she has some concerns. Pt requests call back. Cb# [phone]

## 2018-12-30 ENCOUNTER — Other Ambulatory Visit: Payer: Self-pay

## 2018-12-30 ENCOUNTER — Ambulatory Visit: Payer: Self-pay | Admitting: Internal Medicine

## 2018-12-30 DIAGNOSIS — Z7901 Long term (current) use of anticoagulants: Secondary | ICD-10-CM | POA: Diagnosis not present

## 2018-12-30 LAB — POCT INR: INR: 1.5 — AB (ref 0.9–1.1)

## 2018-12-30 NOTE — Telephone Encounter (Signed)
Spoke with pt to make sure she had not switched to a different medication. Pt stated that she has not and she is still taking the 1mg  daily.   Abstracted.

## 2018-12-30 NOTE — Telephone Encounter (Signed)
Home Health nurse called in an INR of 1.5.  I sent this information to the office.

## 2018-12-30 NOTE — Telephone Encounter (Signed)
Pt is scheduled for a telephone visit tomorrow with Dr. Derrel Nip to discuss. Pt is aware of appt date and time.

## 2018-12-30 NOTE — Telephone Encounter (Signed)
Pt spoke with her heart doctor and daughter about Xarelto and Eliquis and everyone seemed to lead to Eliquis and Pt wants to speak with Dr. Derrel Nip about why she chooses one vs the other and she has concerning questions / Pt has appt for next Monday but doesn't know if she has enough coumadin to last until that appt so she wants to speak with Dr. Derrel Nip or nurse / please advise

## 2018-12-31 ENCOUNTER — Telehealth: Payer: Self-pay

## 2018-12-31 ENCOUNTER — Ambulatory Visit (INDEPENDENT_AMBULATORY_CARE_PROVIDER_SITE_OTHER): Payer: Medicare Other | Admitting: Internal Medicine

## 2018-12-31 ENCOUNTER — Other Ambulatory Visit: Payer: Self-pay

## 2018-12-31 ENCOUNTER — Encounter: Payer: Self-pay | Admitting: Internal Medicine

## 2018-12-31 DIAGNOSIS — Z7901 Long term (current) use of anticoagulants: Secondary | ICD-10-CM | POA: Diagnosis not present

## 2018-12-31 NOTE — Progress Notes (Signed)
Pt needs to discuss medicines eliquis and xarelto.  Patient hasn't started eliquis.

## 2018-12-31 NOTE — Telephone Encounter (Signed)
Patient aware of results.  Advisement give per Dr. Lupita Dawn notes.  Patient voiced understanding.

## 2018-12-31 NOTE — Progress Notes (Signed)
Telephone  Note  This visit type was conducted due to national recommendations for restrictions regarding the COVID-19 pandemic (e.g. social distancing).  This format is felt to be most appropriate for this patient at this time.  All issues noted in this document were discussed and addressed.  No physical exam was performed (except for noted visual exam findings with Video Visits).   I connected with@ on 12/31/18 at 11:30 AM EDT by  telephone and verified that I am speaking with the correct person using two identifiers. Location patient: home Location provider: work or home office Persons participating in the virtual visit: patient, provider  I discussed the limitations, risks, security and privacy concerns of performing an evaluation and management service by telephone and the availability of in person appointments. I also discussed with the patient that there may be a patient responsible charge related to this service. The patient expressed understanding and agreed to proceed.  Reason for visit: anticoagulation   HPI:   Follow up on patient requesting to change her anticoagulant therapy from NBO Coumadin (no longer available per her pharmacy) but is undeided  about xarelto vs eliquis .  She has spoken to her cardiologist's PA as well and is leaning toward Eliquis instead of Xarelto, because of efficacy reports that she has been reading ,  But acknowledges that taking a medication twice daily is problematic due to forgetfulness.  She has been taking 1 mg coumadin daily for the past   ROS: See pertinent positives and negatives per HPI.  Past Medical History:  Diagnosis Date  . Allergy   . Atrial fibrillation ([place])   . CAD (coronary artery disease)   . Cancer (HCC)    UTERINE  . Chronic anticoagulation   . Chronic diarrhea   . Decubitus ulcer    sacral region  . Diabetes ([place])    diet controlled  . Dysuria   . Edema of lower extremity    mainly right foot, slightly in left foot.   . Fibrocystic breast disease   . GERD (gastroesophageal reflux disease)   . Glaucoma   . Glaucoma   . Hematuria   . Hemorrhoids   . History of colon polyps   . History of pancreatitis   . Hyperlipidemia   . Hypertension   . Hypokalemia   . IBS (irritable bowel syndrome)   . Microscopic hematuria   . Mitral valve regurgitation   . Osteoarthritis    fingers  . Pernicious anemia   . Plantar fasciitis   . Recurrent UTI   . Skin cancer   . Sleep apnea, obstructive   . Vaginal atrophy   . Vitamin D deficiency   . Yeast vaginitis     Past Surgical History:  Procedure Laterality Date  . ABDOMINAL HYSTERECTOMY  1980's  . ABDOMINAL SURGERY     for villous polyp,,,many years ago  . APPENDECTOMY  1940's  . ASCAD, s/p PTCA  11/28/2005   MID LESION   . BREAST BIOPSY Left 1970's  . CARPAL TUNNEL RELEASE Right 12/13/2015   Procedure: CARPAL TUNNEL RELEASE;  Surgeon: Thornton Park, MD;  Location: [facility] ORS;  Service: Orthopedics;  Laterality: Right;  . COLECTOMY  2015  . PACEMAKER PLACEMENT    . radation     for uterine cance  . REFRACTIVE SURGERY     for bilateral glaumoma  . TONSILLECTOMY  1936    Family History  Problem Relation Age of Onset  . Coronary artery disease Father   .  Kidney disease Father   . Cancer Sister   . Diabetes Other   . Cancer Mother        COLON  . Bladder Cancer Neg Hx   . Breast cancer Neg Hx     SOCIAL HX: reports that she has quit smoking. She has never used smokeless tobacco. She reports current alcohol use. She reports that she does not use drugs.   Current Outpatient Medications:  .  cholecalciferol (VITAMIN D) 1000 UNITS tablet, Take 1,000 Units by mouth daily., Disp: , Rfl:  .  clobetasol cream (TEMOVATE) 5.40 %, Apply 1 application topically 2 (two) times daily., Disp: 30 g, Rfl: 0 .  cyanocobalamin (,VITAMIN B-12,) 1000 MCG/ML injection, INJECT 1 ML  INTO THE MUSCLE EVERY 14 DAYS., Disp: 10 mL, Rfl: 0 .  diltiazem (CARDIZEM CD) 180  MG 24 hr capsule, TAKE 1 CAPSULE BY MOUTH EVERY MORNING, Disp: 90 capsule, Rfl: 1 .  diphenoxylate-atropine (LOMOTIL) 2.5-0.025 MG tablet, TAKE ONE TABLET BY MOUTH TWICE A DAY AS NEEDED FOR DIARRHEA, Disp: 60 tablet, Rfl: 0 .  hydrocortisone-pramoxine (ANALPRAM-HC) 2.5-1 % rectal cream, APPLY RECTALLY THREE TIMES DAILY, Disp: 30 g, Rfl: 0 .  Hypromellose (GENTEAL) 0.3 % SOLN, Place 1 drop into both eyes daily as needed (dryness). , Disp: , Rfl:  .  ketoconazole (NIZORAL) 2 % cream, Apply 1 application topically daily as needed. , Disp: , Rfl: 1 .  metoprolol succinate (TOPROL-XL) 25 MG 24 hr tablet, Take 1 tablet (25 mg total) by mouth at bedtime., Disp: 90 tablet, Rfl: 1 .  ondansetron (ZOFRAN) 4 MG tablet, Take 1 tablet (4 mg total) by mouth every 8 (eight) hours as needed for nausea or vomiting., Disp: 30 tablet, Rfl: 0 .  Rivaroxaban (XARELTO) 15 MG TABS tablet, Take 1 tablet (15 mg total) by mouth daily with supper., Disp: 30 tablet, Rfl: 2 .  apixaban (ELIQUIS) 2.5 MG TABS tablet, Take 2.5 mg by mouth 2 (two) times daily., Disp: , Rfl:  .  aspirin EC 81 MG tablet, Take 1 tablet (81 mg total) by mouth daily. (Patient not taking: Reported on 12/31/2018), Disp: 90 tablet, Rfl: 1 .  ciprofloxacin (CIPRO) 250 MG tablet, Take 1 tablet (250 mg total) by mouth 2 (two) times daily. (Patient not taking: Reported on 12/31/2018), Disp: 6 tablet, Rfl: 0 .  estradiol (ESTRACE) 0.1 MG/GM vaginal cream, Place 2 g vaginally at bedtime. For 2 weeks,  Then reduce dose to 1 gram every 3-4 days (Patient not taking: Reported on 12/31/2018), Disp: 42.5 g, Rfl: 5 .  triamcinolone cream (KENALOG) 0.1 %, Apply 1 application topically 2 (two) times daily. (Patient not taking: Reported on 12/31/2018), Disp: 30 g, Rfl: 0  EXAM:   General impression: alert, cooperative and articulate.  No signs of being in distress  Lungs: speech is fluent sentence length suggests that patient is not short of breath and not punctuated by  cough, sneezing or sniffing. Marland Kitchen   Psych: affect normal.  speech is articulate and non pressured .  Denies suicidal thoughts   ASSESSMENT AND PLAN:  Encounter for current long-term use of anticoagulants A total of 25 minutes of  Non  face to face time was spent with patient more than half of which was spent in counselling about the risks and benefits of DOACs vs warfarin . She has chosen eliquis despite the concern that bid dosing may be difficult for her to adhere to    I discussed the assessment and treatment plan  with the patient. The patient was provided an opportunity to ask questions and all were answered. The patient agreed with the plan and demonstrated an understanding of the instructions.   The patient was advised to call back or seek an in-person evaluation if the symptoms worsen or if the condition fails to improve as anticipated.  I provided 22 minutes of non-face-to-face time during this encounter.   Crecencio Mc, MD  q

## 2018-12-31 NOTE — Telephone Encounter (Signed)
Patient to  Continue current regimen until she starts taking eliquis,  Then she will stop the coumadin  After one day of both.

## 2018-12-31 NOTE — Telephone Encounter (Signed)
Fax received from [facility].  Reported patient's INR on 12/30/18 as 1.5

## 2019-01-01 NOTE — Assessment & Plan Note (Signed)
A total of 25 minutes of  Non  face to face time was spent with patient more than half of which was spent in counselling about the risks and benefits of DOACs vs warfarin . She has chosen eliquis despite the concern that bid dosing may be difficult for her to adhere to

## 2019-01-06 ENCOUNTER — Other Ambulatory Visit: Payer: Self-pay

## 2019-01-06 ENCOUNTER — Telehealth: Payer: Self-pay | Admitting: Lab

## 2019-01-06 ENCOUNTER — Ambulatory Visit: Payer: Self-pay | Admitting: *Deleted

## 2019-01-06 ENCOUNTER — Ambulatory Visit (INDEPENDENT_AMBULATORY_CARE_PROVIDER_SITE_OTHER): Payer: Medicare Other | Admitting: Family Medicine

## 2019-01-06 ENCOUNTER — Telehealth: Payer: Self-pay | Admitting: Family Medicine

## 2019-01-06 DIAGNOSIS — N898 Other specified noninflammatory disorders of vagina: Secondary | ICD-10-CM | POA: Diagnosis not present

## 2019-01-06 DIAGNOSIS — L292 Pruritus vulvae: Secondary | ICD-10-CM | POA: Diagnosis not present

## 2019-01-06 DIAGNOSIS — R350 Frequency of micturition: Secondary | ICD-10-CM | POA: Diagnosis not present

## 2019-01-06 MED ORDER — CRANBERRY 200 MG PO CAPS: 1.0000 | ORAL_CAPSULE | Freq: Every day | ORAL | 2 refills | Status: DC

## 2019-01-06 NOTE — Telephone Encounter (Signed)
Please call Aaron Edelman RN at the [facility] [place] to get orders for   1. Urinalysis with reflex microscopic 2. Urine culture  Dx: Vaginal itch, Urinary frequency  Thanks  LG

## 2019-01-06 NOTE — Telephone Encounter (Signed)
Called Kara [name] at [place] No answer left VM to call me when he gets this message. Will try back later.

## 2019-01-06 NOTE — Progress Notes (Signed)
Patient ID: Kara [name], female   DOB: Apr 13, 1923, 85 y.o.   MRN: [id number]    Virtual Visit via Phone Note  This visit type was conducted due to national recommendations for restrictions regarding the COVID-19 pandemic (e.g. social distancing).  This format is felt to be most appropriate for this patient at this time.  All issues noted in this document were discussed and addressed.  No physical exam was performed (except for noted visual exam findings with Video Visits).   I connected with Kara [name] today at 11:20 AM EDT by telephone and verified that I am speaking with the correct person using two identifiers. Location patient: home Location provider: work or home office Persons participating in the virtual visit: patient, provider  I discussed the limitations, risks, security and privacy concerns of performing an evaluation and management service by telephone and the availability of in person appointments. I also discussed with the patient that there may be a patient responsible charge related to this service. The patient expressed understanding and agreed to proceed.  HPI:  Patient and I connected via telephone due to complaints of vaginal itching, increased urinary frequency and cloudy urine at times.  Patient has had issues with vaginal itching and urinary frequency waxing and waning for many months to years.  Currently is on Estrace vaginal cream 2 times a week and was recently treated for UTI at the end of July 2020.  Denies any blood in urine.  Denies any abdominal pain, nausea or vomiting.  Denies fever or chills.  Appetite is normal and is trying to keep up good water intake.  Does wear a panty liner or pad often throughout the day, states this could be part of the reason she has vaginal itching and vaginal skin irritation at times.   ROS: See pertinent positives and negatives per HPI.  Past Medical History:  Diagnosis Date  . Allergy   . Atrial fibrillation ([place])   . CAD  (coronary artery disease)   . Cancer (HCC)    UTERINE  . Chronic anticoagulation   . Chronic diarrhea   . Decubitus ulcer    sacral region  . Diabetes ([place])    diet controlled  . Dysuria   . Edema of lower extremity    mainly right foot, slightly in left foot.  . Fibrocystic breast disease   . GERD (gastroesophageal reflux disease)   . Glaucoma   . Glaucoma   . Hematuria   . Hemorrhoids   . History of colon polyps   . History of pancreatitis   . Hyperlipidemia   . Hypertension   . Hypokalemia   . IBS (irritable bowel syndrome)   . Microscopic hematuria   . Mitral valve regurgitation   . Osteoarthritis    fingers  . Pernicious anemia   . Plantar fasciitis   . Recurrent UTI   . Skin cancer   . Sleep apnea, obstructive   . Vaginal atrophy   . Vitamin D deficiency   . Yeast vaginitis     Past Surgical History:  Procedure Laterality Date  . ABDOMINAL HYSTERECTOMY  1980's  . ABDOMINAL SURGERY     for villous polyp,,,many years ago  . APPENDECTOMY  1940's  . ASCAD, s/p PTCA  11/28/2005   MID LESION   . BREAST BIOPSY Left 1970's  . CARPAL TUNNEL RELEASE Right 12/13/2015   Procedure: CARPAL TUNNEL RELEASE;  Surgeon: Thornton Park, MD;  Location: [facility] ORS;  Service: Orthopedics;  Laterality: Right;  .  COLECTOMY  2015  . PACEMAKER PLACEMENT    . radation     for uterine cance  . REFRACTIVE SURGERY     for bilateral glaumoma  . TONSILLECTOMY  1936    Family History  Problem Relation Age of Onset  . Coronary artery disease Father   . Kidney disease Father   . Cancer Sister   . Diabetes Other   . Cancer Mother        COLON  . Bladder Cancer Neg Hx   . Breast cancer Neg Hx     Social History   Tobacco Use  . Smoking status: Former Research scientist (life sciences)  . Smokeless tobacco: Never Used  . Tobacco comment: quit 45 years ago  Substance Use Topics  . Alcohol use: Yes    Alcohol/week: 0.0 standard drinks    Comment: Rarely, 1-2 times a year     Current Outpatient  Medications:  .  apixaban (ELIQUIS) 2.5 MG TABS tablet, Take 2.5 mg by mouth 2 (two) times daily., Disp: , Rfl:  .  aspirin EC 81 MG tablet, Take 1 tablet (81 mg total) by mouth daily., Disp: 90 tablet, Rfl: 1 .  cholecalciferol (VITAMIN D) 1000 UNITS tablet, Take 1,000 Units by mouth daily., Disp: , Rfl:  .  ciprofloxacin (CIPRO) 250 MG tablet, Take 1 tablet (250 mg total) by mouth 2 (two) times daily., Disp: 6 tablet, Rfl: 0 .  clobetasol cream (TEMOVATE) 5.03 %, Apply 1 application topically 2 (two) times daily., Disp: 30 g, Rfl: 0 .  cyanocobalamin (,VITAMIN B-12,) 1000 MCG/ML injection, INJECT 1 ML  INTO THE MUSCLE EVERY 14 DAYS., Disp: 10 mL, Rfl: 0 .  diltiazem (CARDIZEM CD) 180 MG 24 hr capsule, TAKE 1 CAPSULE BY MOUTH EVERY MORNING, Disp: 90 capsule, Rfl: 1 .  diphenoxylate-atropine (LOMOTIL) 2.5-0.025 MG tablet, TAKE ONE TABLET BY MOUTH TWICE A DAY AS NEEDED FOR DIARRHEA, Disp: 60 tablet, Rfl: 0 .  estradiol (ESTRACE) 0.1 MG/GM vaginal cream, Place 2 g vaginally at bedtime. For 2 weeks,  Then reduce dose to 1 gram every 3-4 days, Disp: 42.5 g, Rfl: 5 .  hydrocortisone-pramoxine (ANALPRAM-HC) 2.5-1 % rectal cream, APPLY RECTALLY THREE TIMES DAILY, Disp: 30 g, Rfl: 0 .  Hypromellose (GENTEAL) 0.3 % SOLN, Place 1 drop into both eyes daily as needed (dryness). , Disp: , Rfl:  .  ketoconazole (NIZORAL) 2 % cream, Apply 1 application topically daily as needed. , Disp: , Rfl: 1 .  metoprolol succinate (TOPROL-XL) 25 MG 24 hr tablet, Take 1 tablet (25 mg total) by mouth at bedtime., Disp: 90 tablet, Rfl: 1 .  ondansetron (ZOFRAN) 4 MG tablet, Take 1 tablet (4 mg total) by mouth every 8 (eight) hours as needed for nausea or vomiting., Disp: 30 tablet, Rfl: 0 .  Rivaroxaban (XARELTO) 15 MG TABS tablet, Take 1 tablet (15 mg total) by mouth daily with supper., Disp: 30 tablet, Rfl: 2 .  triamcinolone cream (KENALOG) 0.1 %, Apply 1 application topically 2 (two) times daily., Disp: 30 g, Rfl: 0  EXAM:   GENERAL: alert, oriented, appears well and in no acute distress  HEENT: atraumatic, conjunttiva clear, no obvious abnormalities on inspection of external nose and ears  NECK: normal movements of the head and neck  LUNGS: on inspection no signs of respiratory distress, breathing rate appears normal, no obvious gross SOB, gasping or wheezing  CV: no obvious cyanosis  MS: moves all visible extremities without noticeable abnormality  PSYCH/NEURO: pleasant and cooperative, no obvious  depression or anxiety, speech and thought processing grossly intact  ASSESSMENT AND PLAN:  Discussed the following assessment and plan:  Vaginal itching/urinary frequency - Long discussion with patient in regards to how atrophic vaginitis can cause sensation of vaginal itching and irritation in the vaginal area & that it is NOT always related to a UTI.  Also recommended she try to go for least a few hours throughout the day without wearing a pad and allow skin in the vaginal area sometimes to air out; recommended cotton underwear as this fabric type is more breathable.  Also advised increasing water intake and avoiding excess sugary or caffeinated beverages.  Recommended she take a cranberry tablet rather than drinking cranberry juice as cranberry juice is full of sugar.  She will continue using her Estrace cream as prescribed.  We will repeat urine testing.  Offered referral to urology or urogynecology due to her continued vaginal symptoms however she declines at this time.   I discussed the assessment and treatment plan with the patient. The patient was provided an opportunity to ask questions and all were answered. The patient agreed with the plan and demonstrated an understanding of the instructions.   The patient was advised to call back or seek an in-person evaluation if the symptoms worsen or if the condition fails to improve as anticipated.  I provided 23 minutes of non-face-to-face time during this  encounter discussing why we need to to further investigation into symptoms and cannot just treat for UTI.    Jodelle Green, FNP

## 2019-01-06 NOTE — Telephone Encounter (Signed)
Ok thank you 

## 2019-01-06 NOTE — Telephone Encounter (Signed)
FYI Pt scheduled for 11:20am Telephone visit Kara [name] calls after treatment for UTI Klebsiella 1st on 7/14 with Cipro 500mg  and 7/24 with cipro 250 mg (6 tabs). Also treated for yeast afterwards. She continues to have burning with itching in the vaginal area. Also, c/o frequency and feeling like her bladder is not emptying.Urine appeared cloudy this morning. Reports this seems to be worse at night.  No fever. Stated yesterday upon waking, she seemed "not right, maybe lightheaded, not really dizzy" no vertigo. Drinking maybe 3 glasses of water only each day. Encouraged increasing water intake. Attempted warm transfer for appointment

## 2019-01-06 NOTE — Telephone Encounter (Signed)
Called RN Aaron Edelman at [facility]. I gave him a verbal order on Pt with Dx code. Aaron Edelman stated he will call us back as soon as he gets the Pt results back.

## 2019-01-06 NOTE — Telephone Encounter (Signed)
Patient scheduled a virtual visit w/ Philis Nettle, NP today @ 11:20 am.

## 2019-01-06 NOTE — Telephone Encounter (Signed)
Kara [name] calls after treatment for UTI Klebsiella 1st on 7/14 with Cipro 500mg  and 7/24 with cipro 250 mg (6 tabs). Also treated for yeast afterwards. She continues to have burning with itching in the vaginal area. Also, c/o frequency and feeling like her bladder is not emptying.Urine appeared cloudy this morning. Reports this seems to be worse at night.  No fever. Stated yesterday upon waking, she seemed "not right, maybe lightheaded, not really dizzy" no vertigo. Drinking maybe 3 glasses of water only each day. Encouraged increasing water intake. Attempted warm transfer for appointment.

## 2019-01-06 NOTE — Telephone Encounter (Signed)
Great, thanks

## 2019-01-07 ENCOUNTER — Telehealth: Payer: Self-pay | Admitting: Lab

## 2019-01-07 ENCOUNTER — Other Ambulatory Visit: Payer: Self-pay | Admitting: Family Medicine

## 2019-01-07 ENCOUNTER — Telehealth: Payer: Self-pay | Admitting: Family Medicine

## 2019-01-07 DIAGNOSIS — N3001 Acute cystitis with hematuria: Secondary | ICD-10-CM

## 2019-01-07 MED ORDER — FLUCONAZOLE 150 MG PO TABS: 150.0000 mg | ORAL_TABLET | Freq: Once | ORAL | 0 refills | Status: AC

## 2019-01-07 MED ORDER — CEPHALEXIN 500 MG PO CAPS: 500.0000 mg | ORAL_CAPSULE | Freq: Two times a day (BID) | ORAL | 0 refills | Status: AC

## 2019-01-07 NOTE — Telephone Encounter (Signed)
  Called Pt with results she stated she understood. I told Pt Rx was sent to pharmacy and will be delivered. Pt stated she would like something for Yeast infection, she usually get an infection after taking antibiotics.

## 2019-01-07 NOTE — Progress Notes (Signed)
Urine collected by [facility] +leuks and +nitrites  We will treat for UTI and wait on culture results to see if antibiotics need to be changed  Sharee Pimple --- please call Ms Antee and let her know her urine looks +for UTI and I have [facility] with a note she would like them delivered  Thanks!  LG

## 2019-01-07 NOTE — Telephone Encounter (Signed)
Called Pt with results she stated she understood. I told Pt Rx was sent to pharmacy and will be delivered. Pt stated she would like something for Yeast infection, she usually get an infection after taking antibiotics.

## 2019-01-07 NOTE — Telephone Encounter (Signed)
Kara [name] --- please call Ms [place] and let her know her urine looks +for UTI and I have [facility] with a note she would like them delivered  Thanks!  LG

## 2019-01-10 NOTE — Telephone Encounter (Signed)
Called Pt with results she stated she understood.

## 2019-01-10 NOTE — Telephone Encounter (Signed)
Please let patient know --  Urine culture shows the antibiotic prescribed should work to treat UTI, finish the whole course & keep up good water intake

## 2019-01-17 ENCOUNTER — Ambulatory Visit: Payer: Medicare Other | Admitting: Internal Medicine

## 2019-01-17 ENCOUNTER — Other Ambulatory Visit: Payer: Self-pay | Admitting: Family Medicine

## 2019-01-17 ENCOUNTER — Telehealth: Payer: Self-pay | Admitting: Internal Medicine

## 2019-01-17 DIAGNOSIS — R350 Frequency of micturition: Secondary | ICD-10-CM

## 2019-01-17 DIAGNOSIS — N898 Other specified noninflammatory disorders of vagina: Secondary | ICD-10-CM

## 2019-01-17 DIAGNOSIS — N390 Urinary tract infection, site not specified: Secondary | ICD-10-CM

## 2019-01-17 MED ORDER — FLUCONAZOLE 150 MG PO TABS: 150.0000 mg | ORAL_TABLET | Freq: Once | ORAL | 0 refills | Status: AC

## 2019-01-17 NOTE — Telephone Encounter (Signed)
Called Pt with information, Pt stated she does not want antibiotic she wants a Yeast pill. The [facility] gave the wrong information.

## 2019-01-17 NOTE — Telephone Encounter (Signed)
Patient saw Lauren on 01/06/19 for vaginal itching and urinary frequency.  Pt stating that symptoms have not cleared up and requesting another Rx for antibiotic be sent to pharmacy.

## 2019-01-17 NOTE — Telephone Encounter (Signed)
1 more yeast pill sent  I still think she needs to see urology or GYN for pelvic exam due to continued to symptoms

## 2019-01-17 NOTE — Telephone Encounter (Signed)
Patient called and would like to Dr. Derrel Nip or her CMA about her yeast infection not clearing out and would like another antibiotic called in to her pharmacy and they can mail it to her home.

## 2019-01-17 NOTE — Telephone Encounter (Signed)
No, we cannot just sent in another round. I think with this re-current UTI we need to have her see a urologist ASAP for evaluation  The itching could be related to dryness, not infection and urgency not always related to UTI  I will place urgent referral   Thanks  LG

## 2019-01-17 NOTE — Telephone Encounter (Signed)
Pt called [facility] Patient saw Lauren on 01/06/19 for vaginal itching and urinary frequency.  Pt stating that symptoms have not cleared up and requesting another Rx for antibiotic be sent to pharmacy.

## 2019-01-18 NOTE — Telephone Encounter (Signed)
Patient is willing to see GYN prefers a female doctor and would like top go to the GYN office [facility], patient advised that she has a "white, grainy looking discharge that she has to clean with a pad" ask patient any odor she says none that she has noticed.

## 2019-01-18 NOTE — Telephone Encounter (Signed)
Referral is in.

## 2019-01-21 ENCOUNTER — Other Ambulatory Visit (HOSPITAL_COMMUNITY)
Admission: RE | Admit: 2019-01-21 | Discharge: 2019-01-21 | Disposition: A | Discharge status: Home / Self Care | Payer: Medicare Other | Source: Ambulatory Visit | Attending: Certified Nurse Midwife | Admitting: Certified Nurse Midwife

## 2019-01-21 ENCOUNTER — Other Ambulatory Visit: Payer: Self-pay

## 2019-01-21 ENCOUNTER — Encounter: Payer: Self-pay | Admitting: Internal Medicine

## 2019-01-21 ENCOUNTER — Ambulatory Visit (INDEPENDENT_AMBULATORY_CARE_PROVIDER_SITE_OTHER): Payer: Medicare Other | Admitting: Internal Medicine

## 2019-01-21 ENCOUNTER — Encounter: Payer: Self-pay | Admitting: Certified Nurse Midwife

## 2019-01-21 ENCOUNTER — Ambulatory Visit (INDEPENDENT_AMBULATORY_CARE_PROVIDER_SITE_OTHER): Payer: Medicare Other | Admitting: Certified Nurse Midwife

## 2019-01-21 VITALS — BP 131/60 | HR 80 | Ht 62.0 in | Wt 133.2 lb

## 2019-01-21 VITALS — Ht 62.0 in | Wt 138.0 lb

## 2019-01-21 DIAGNOSIS — E1140 Type 2 diabetes mellitus with diabetic neuropathy, unspecified: Secondary | ICD-10-CM

## 2019-01-21 DIAGNOSIS — Z8742 Personal history of other diseases of the female genital tract: Secondary | ICD-10-CM | POA: Diagnosis not present

## 2019-01-21 DIAGNOSIS — Z7901 Long term (current) use of anticoagulants: Secondary | ICD-10-CM

## 2019-01-21 DIAGNOSIS — L539 Erythematous condition, unspecified: Secondary | ICD-10-CM

## 2019-01-21 DIAGNOSIS — N760 Acute vaginitis: Secondary | ICD-10-CM

## 2019-01-21 DIAGNOSIS — N898 Other specified noninflammatory disorders of vagina: Secondary | ICD-10-CM | POA: Diagnosis not present

## 2019-01-21 DIAGNOSIS — N9089 Other specified noninflammatory disorders of vulva and perineum: Secondary | ICD-10-CM | POA: Insufficient documentation

## 2019-01-21 MED ORDER — NYSTATIN-TRIAMCINOLONE 100000-0.1 UNIT/GM-% EX CREA: 1.0000 "application " | TOPICAL_CREAM | Freq: Two times a day (BID) | CUTANEOUS | 0 refills | Status: DC

## 2019-01-21 NOTE — Patient Instructions (Signed)
Lichen Sclerosus Lichen sclerosus is a skin problem. It can happen on any part of the body, but it commonly involves the anal or genital areas. It can cause itching and discomfort in these areas. Treatment can help to control symptoms. When the genital area is affected, getting treatment is important because the condition can cause scarring that may lead to other problems. What are the causes? The cause of this condition is not known. It may be related to an overactive immune system or a lack of certain hormones. Lichen sclerosus is not an infection or a fungus, and it is not passed from one person to another (not contagious). What increases the risk? This condition is more likely to develop in women, usually after menopause. What are the signs or symptoms? Symptoms of this condition include:  Thin, wrinkled, white areas on the skin.  Thickened white areas on the skin.  Red and swollen patches (lesions) on the skin.  Tears or cracks in the skin.  Bruising.  Blood blisters.  Severe itching.  Pain, itching, or burning when urinating. Constipation is also common in people with lichen sclerosus. How is this diagnosed? This condition may be diagnosed with a physical exam. In some cases, a tissue sample (biopsy sample) may be removed to be looked at under a microscope. How is this treated? This condition is usually treated with medicated creams or ointments (topical steroids) that are applied over the affected areas. In some cases, treatment may also include medicines that are taken by mouth. Surgery may be needed in more severe cases that are causing problems such as scarring. Follow these instructions at home:  Take or use over-the-counter and prescription medicines only as told by your health care provider.  Use creams or ointments as told by your health care provider.  Do not scratch the affected areas of skin.  If you are a woman, be sure to keep the vaginal area as clean and dry  as possible.  Clean the affected area of skin gently with water. Avoid using rough towels or toilet paper.  Keep all follow-up visits as told by your health care provider. This is important. Contact a health care provider if:  You have increasing redness, swelling, or pain in the affected area.  You have fluid, blood, or pus coming from the affected area.  You have new lesions on your skin.  You have a fever.  You have pain during sex. Summary  Lichen sclerosus is a skin problem. When the genital area is affected, getting treatment is important because the condition can cause scarring that may lead to other problems.  This condition is usually treated with medicated creams or ointments (topical steroids) that are applied over the affected areas.  Take or use over-the-counter and prescription medicines only as told by your health care provider.  Contact a health care provider if you have new lesions on your skin, have pain during sex, or have increasing redness, swelling, or pain in the affected area.  Keep all follow-up visits as told by your health care provider. This is important. This information is not intended to replace advice given to you by your health care provider. Make sure you discuss any questions you have with your health care provider. Document Released: 09/25/2010 Document Revised: 09/17/2017 Document Reviewed: 09/17/2017 Elsevier Patient Education  2020 [employer]. Atrophic Vaginitis  Atrophic vaginitis is a condition in which the tissues that line the vagina become dry and thin. This condition is most common in women  who have stopped having regular menstrual periods (are in menopause). This usually starts when a woman is 75-62 years old. That is the time when a woman's estrogen levels begin to drop (decrease). Estrogen is a female hormone. It helps to keep the tissues of the vagina moist. It stimulates the vagina to produce a clear fluid that lubricates the  vagina for sexual intercourse. This fluid also protects the vagina from infection. Lack of estrogen can cause the lining of the vagina to get thinner and dryer. The vagina may also shrink in size. It may become less elastic. Atrophic vaginitis tends to get worse over time as a woman's estrogen level drops. What are the causes? This condition is caused by the normal drop in estrogen that happens around the time of menopause. What increases the risk? Certain conditions or situations may lower a woman's estrogen level, leading to a higher risk for atrophic vaginitis. You are more likely to develop this condition if:  You are taking medicines that block estrogen.  You have had your ovaries removed.  You are being treated for cancer with X-ray (radiation) or medicines (chemotherapy).  You have given birth or are breastfeeding.  You are older than age 27.  You smoke. What are the signs or symptoms? Symptoms of this condition include:  Pain, soreness, or bleeding during sexual intercourse (dyspareunia).  Vaginal burning, irritation, or itching.  Pain or bleeding when a speculum is used in a vaginal exam (pelvic exam).  Having burning pain when passing urine.  Vaginal discharge that is brown or yellow. In some cases, there are no symptoms. How is this diagnosed? This condition is diagnosed by taking a medical history and doing a physical exam. This will include a pelvic exam that checks the vaginal tissues. Though rare, you may also have other tests, including:  A urine test.  A test that checks the acid balance in your vagina (acid balance test). How is this treated? Treatment for this condition depends on how severe your symptoms are. Treatment may include:  Using an over-the-counter vaginal lubricant before sex.  Using a long-acting vaginal moisturizer.  Using low-dose vaginal estrogen for moderate to severe symptoms that do not respond to other treatments. Options include  creams, tablets, and inserts (vaginal rings). Before you use a vaginal estrogen, tell your health care provider if you have a history of: ? Breast cancer. ? Endometrial cancer. ? Blood clots. If you are not sexually active and your symptoms are very mild, you may not need treatment. Follow these instructions at home: Medicines  Take over-the-counter and prescription medicines only as told by your health care provider. Do not use herbal or alternative medicines unless your health care provider says that you can.  Use over-the-counter creams, lubricants, or moisturizers for dryness only as directed by your health care provider. General instructions  If your atrophic vaginitis is caused by menopause, discuss all of your menopause symptoms and treatment options with your health care provider.  Do not douche.  Do not use products that can make your vagina dry. These include: ? Scented feminine sprays. ? Scented tampons. ? Scented soaps.  Vaginal intercourse can help to improve blood flow and elasticity of vaginal tissue. If it hurts to have sex, try using a lubricant or moisturizer just before having intercourse. Contact a health care provider if:  Your discharge looks different than normal.  Your vagina has an unusual smell.  You have new symptoms.  Your symptoms do not improve with treatment.  Your symptoms get worse. Summary  Atrophic vaginitis is a condition in which the tissues that line the vagina become dry and thin. It is most common in women who have stopped having regular menstrual periods (are in menopause).  Treatment options include using vaginal lubricants and low-dose vaginal estrogen.  Contact a health care provider if your vagina has an unusual smell, or if your symptoms get worse or do not improve after treatment. This information is not intended to replace advice given to you by your health care provider. Make sure you discuss any questions you have with your  health care provider. Document Released: 09/19/2014 Document Revised: 04/17/2017 Document Reviewed: 01/29/2017 Elsevier Patient Education  2020 [employer].

## 2019-01-21 NOTE — Progress Notes (Signed)
Patient c/o vaginal itching and vaginal area looks white, "I took a pill Tuesday to help with yeast" given by PCP.  Patient was recently on antibiotic for UTI.

## 2019-01-21 NOTE — Progress Notes (Signed)
Telephone Note  This visit type was conducted due to national recommendations for restrictions regarding the COVID-19 pandemic (e.g. social distancing).  This format is felt to be most appropriate for this patient at this time.  All issues noted in this document were discussed and addressed.  No physical exam was performed (except for noted visual exam findings with Video Visits).   I connected with@ on 01/21/19 at 10:00 AM EDT by telephone and verified that I am speaking with the correct person using two identifiers. Location patient: home Location provider: work or home office Persons participating in the virtual visit: patient, provider  I discussed the limitations, risks, security and privacy concerns of performing an evaluation and management service by telephone and the availability of in person appointments. I also discussed with the patient that there may be a patient responsible charge related to this service. The patient expressed understanding and agreed to proceed.   Reason for visit: follow up  On diabetes, hypertension  HPI: 85 yr old female with history of PAD and CAD. Atrial fib ,  On long term anticoagulation with recent change from coumadin to Eliquis , presents for follow up on multiple conditions.   .   Gait instability:  Feels wobbly .  Still walkig  No falls.   Treated for klebsiella UTI  With cipro on July 27.   Cc was dysuria and Vaginal itching .  Symptoms better but not gone  (f itching )  Finished fluconazole on Tuesday .  Has appt with a midwife for a pelvic exam  This afternoon.  Biopsy to be done   DM: does not check BS . Last a1c was 6.6.  Indulging less in cookies,  Cakes and ice cream.   Atrial fib,  Rate controlled . Now on eliquis . Denies  side effects.    ROS: See pertinent positives and negatives per HPI.  Past Medical History:  Diagnosis Date  . Allergy   . Atrial fibrillation ([place])   . CAD (coronary artery disease)   . Cancer (HCC)    UTERINE   . Chronic anticoagulation   . Chronic diarrhea   . Decubitus ulcer    sacral region  . Diabetes ([place])    diet controlled  . Dysuria   . Edema of lower extremity    mainly right foot, slightly in left foot.  . Fibrocystic breast disease   . GERD (gastroesophageal reflux disease)   . Glaucoma   . Glaucoma   . Hematuria   . Hemorrhoids   . History of colon polyps   . History of pancreatitis   . Hyperlipidemia   . Hypertension   . Hypokalemia   . IBS (irritable bowel syndrome)   . Microscopic hematuria   . Mitral valve regurgitation   . Osteoarthritis    fingers  . Pernicious anemia   . Plantar fasciitis   . Recurrent UTI   . Skin cancer   . Sleep apnea, obstructive   . Vaginal atrophy   . Vitamin D deficiency   . Yeast vaginitis     Past Surgical History:  Procedure Laterality Date  . ABDOMINAL HYSTERECTOMY  1980's  . ABDOMINAL SURGERY     for villous polyp,,,many years ago  . APPENDECTOMY  1940's  . ASCAD, s/p PTCA  11/28/2005   MID LESION   . BREAST BIOPSY Left 1970's  . CARPAL TUNNEL RELEASE Right 12/13/2015   Procedure: CARPAL TUNNEL RELEASE;  Surgeon: Thornton Park, MD;  Location: [facility] ORS;  Service: Orthopedics;  Laterality: Right;  . COLECTOMY  2015  . PACEMAKER PLACEMENT    . radation     for uterine cance  . REFRACTIVE SURGERY     for bilateral glaumoma  . TONSILLECTOMY  1936    Family History  Problem Relation Age of Onset  . Coronary artery disease Father   . Kidney disease Father   . Cancer Sister   . Diabetes Other   . Cancer Mother        COLON  . Bladder Cancer Neg Hx   . Breast cancer Neg Hx     SOCIAL HX:  reports that she has quit smoking. She has never used smokeless tobacco. She reports current alcohol use. She reports that she does not use drugs.   Current Outpatient Medications:  .  apixaban (ELIQUIS) 2.5 MG TABS tablet, Take 2.5 mg by mouth 2 (two) times daily., Disp: , Rfl:  .  cholecalciferol (VITAMIN D) 1000 UNITS  tablet, Take 1,000 Units by mouth daily., Disp: , Rfl:  .  clobetasol cream (TEMOVATE) AB-123456789 %, Apply 1 application topically 2 (two) times daily., Disp: 30 g, Rfl: 0 .  Cranberry 200 MG CAPS, Take 1 capsule (200 mg total) by mouth daily., Disp: 30 capsule, Rfl: 2 .  cyanocobalamin (,VITAMIN B-12,) 1000 MCG/ML injection, INJECT 1 ML  INTO THE MUSCLE EVERY 14 DAYS., Disp: 10 mL, Rfl: 0 .  diltiazem (CARDIZEM CD) 180 MG 24 hr capsule, TAKE 1 CAPSULE BY MOUTH EVERY MORNING, Disp: 90 capsule, Rfl: 1 .  diphenoxylate-atropine (LOMOTIL) 2.5-0.025 MG tablet, TAKE ONE TABLET BY MOUTH TWICE A DAY AS NEEDED FOR DIARRHEA, Disp: 60 tablet, Rfl: 0 .  estradiol (ESTRACE) 0.1 MG/GM vaginal cream, Place 2 g vaginally at bedtime. For 2 weeks,  Then reduce dose to 1 gram every 3-4 days, Disp: 42.5 g, Rfl: 5 .  hydrocortisone-pramoxine (ANALPRAM-HC) 2.5-1 % rectal cream, APPLY RECTALLY THREE TIMES DAILY (Patient taking differently: Place 1 application rectally as needed. ), Disp: 30 g, Rfl: 0 .  Hypromellose (GENTEAL) 0.3 % SOLN, Place 1 drop into both eyes daily as needed (dryness). , Disp: , Rfl:  .  ketoconazole (NIZORAL) 2 % cream, Apply 1 application topically daily as needed. , Disp: , Rfl: 1 .  metoprolol succinate (TOPROL-XL) 25 MG 24 hr tablet, Take 1 tablet (25 mg total) by mouth at bedtime., Disp: 90 tablet, Rfl: 1 .  nystatin-triamcinolone (MYCOLOG II) cream, Apply 1 application topically 2 (two) times daily., Disp: 30 g, Rfl: 0  EXAM:  VITALS per patient if applicable:  GENERAL: alert, oriented, appears well and in no acute distress  HEENT: atraumatic, conjunttiva clear, no obvious abnormalities on inspection of external nose and ears  NECK: normal movements of the head and neck  LUNGS: on inspection no signs of respiratory distress, breathing rate appears normal, no obvious gross SOB, gasping or wheezing  CV: no obvious cyanosis  MS: moves all visible extremities without noticeable  abnormality  PSYCH/NEURO: pleasant and cooperative, no obvious depression or anxiety, speech and thought processing grossly intact  ASSESSMENT AND PLAN:  Discussed the following assessment and plan:  Type 2 diabetes mellitus with diabetic neuropathy, without long-term current use of insulin (HCC) - Plan: Hemoglobin A1c, Comprehensive metabolic panel  Encounter for current long-term use of anticoagulants - Plan: CBC with Differential/Platelet  Vaginitis and vulvovaginitis  Vaginitis and vulvovaginitis 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  Tolerating Eliquis, recommended over Xarelto  By her cardiologist   Type 2 diabetes mellitus with diabetic neuropathy, unspecified ([place]) Improved control with less dietary indulgences.   Given her age,  No treatment unless a1c is > 7.5.  Repeat assessment is due   Lab Results  Component Value Date   HGBA1C 6.6 (H) 04/12/2018   Lab Results  Component Value Date   MICROALBUR 0.9 04/12/2018       I discussed the assessment and treatment plan with the patient. The patient was provided an opportunity to ask questions and all were answered. The patient agreed with the plan and demonstrated an understanding of the instructions.   The patient was advised to call back or seek an in-person evaluation if the symptoms worsen or if the condition fails to improve as anticipated.  I provided 15   minutes of non-face-to-face time during this encounter.   Crecencio Mc, MD

## 2019-01-21 NOTE — Progress Notes (Signed)
GYN ENCOUNTER NOTE  Subjective:       Kara [name] is a 85 y.o. G25P1101 female is here for gynecologic evaluation of the following issues:  1. Vaginal itching 2. Vaginal irritation  Reports symptoms since July after treatment for yeast infection. Has been prescribed four (4) courses of Diflucan and completed three (3) without permanent relief of symptoms.   Already uses Estrace cream with occasional relief of symptoms. Patient brought two (2) tubes of estradiol-same dose to office visit [yellow tube and blue tube], reports relief with use of one and not the other.   Denies difficulty breathing or respiratory distress, chest pain, abdominal pain, vaginal bleeding, and leg pain.    Gynecologic History  No LMP recorded. Patient has had a hysterectomy.   Obstetric History  OB History  Gravida Para Term Preterm AB Living  2 2 1 1  0 1  SAB TAB Ectopic Multiple Live Births  0 0 0 0 2    # Outcome Date GA Lbr Len/2nd Weight Sex Delivery Anes PTL Lv  2 Term 10/31/56    F Vag-Spont   LIV  1 Preterm 1956    M Vag-Spont   DEC    Past Medical History:  Diagnosis Date  . Allergy   . Atrial fibrillation ([place])   . CAD (coronary artery disease)   . Cancer (HCC)    UTERINE  . Chronic anticoagulation   . Chronic diarrhea   . Decubitus ulcer    sacral region  . Diabetes ([place])    diet controlled  . Dysuria   . Edema of lower extremity    mainly right foot, slightly in left foot.  . Fibrocystic breast disease   . GERD (gastroesophageal reflux disease)   . Glaucoma   . Glaucoma   . Hematuria   . Hemorrhoids   . History of colon polyps   . History of pancreatitis   . Hyperlipidemia   . Hypertension   . Hypokalemia   . IBS (irritable bowel syndrome)   . Microscopic hematuria   . Mitral valve regurgitation   . Osteoarthritis    fingers  . Pernicious anemia   . Plantar fasciitis   . Recurrent UTI   . Skin cancer   . Sleep apnea, obstructive   . Vaginal atrophy   . Vitamin  D deficiency   . Yeast vaginitis     Past Surgical History:  Procedure Laterality Date  . ABDOMINAL HYSTERECTOMY  1980's  . ABDOMINAL SURGERY     for villous polyp,,,many years ago  . APPENDECTOMY  1940's  . ASCAD, s/p PTCA  11/28/2005   MID LESION   . BREAST BIOPSY Left 1970's  . CARPAL TUNNEL RELEASE Right 12/13/2015   Procedure: CARPAL TUNNEL RELEASE;  Surgeon: Thornton Park, MD;  Location: [facility] ORS;  Service: Orthopedics;  Laterality: Right;  . COLECTOMY  2015  . PACEMAKER PLACEMENT    . radation     for uterine cance  . REFRACTIVE SURGERY     for bilateral glaumoma  . TONSILLECTOMY  1936    Current Outpatient Medications on File Prior to Visit  Medication Sig Dispense Refill  . apixaban (ELIQUIS) 2.5 MG TABS tablet Take 2.5 mg by mouth 2 (two) times daily.    . cholecalciferol (VITAMIN D) 1000 UNITS tablet Take 1,000 Units by mouth daily.    . clobetasol cream (TEMOVATE) AB-123456789 % Apply 1 application topically 2 (two) times daily. 30 g 0  . Cranberry 200 MG CAPS  Take 1 capsule (200 mg total) by mouth daily. 30 capsule 2  . cyanocobalamin (,VITAMIN B-12,) 1000 MCG/ML injection INJECT 1 ML  INTO THE MUSCLE EVERY 14 DAYS. 10 mL 0  . diltiazem (CARDIZEM CD) 180 MG 24 hr capsule TAKE 1 CAPSULE BY MOUTH EVERY MORNING 90 capsule 1  . diphenoxylate-atropine (LOMOTIL) 2.5-0.025 MG tablet TAKE ONE TABLET BY MOUTH TWICE A DAY AS NEEDED FOR DIARRHEA 60 tablet 0  . estradiol (ESTRACE) 0.1 MG/GM vaginal cream Place 2 g vaginally at bedtime. For 2 weeks,  Then reduce dose to 1 gram every 3-4 days 42.5 g 5  . hydrocortisone-pramoxine (ANALPRAM-HC) 2.5-1 % rectal cream APPLY RECTALLY THREE TIMES DAILY (Patient taking differently: Place 1 application rectally as needed. ) 30 g 0  . Hypromellose (GENTEAL) 0.3 % SOLN Place 1 drop into both eyes daily as needed (dryness).     Marland Kitchen ketoconazole (NIZORAL) 2 % cream Apply 1 application topically daily as needed.   1  . metoprolol succinate (TOPROL-XL) 25  MG 24 hr tablet Take 1 tablet (25 mg total) by mouth at bedtime. 90 tablet 1   No current facility-administered medications on file prior to visit.     Allergies  Allergen Reactions  . Baycol [Cerivastatin Sodium]   . Cardizem [Diltiazem Hcl]     LOWER EXTREMITY EDEMA  . Crestor [Rosuvastatin Calcium] Other (See Comments)    INTOLERANT  . Dronedarone Other (See Comments)    Fatigue  . Metoprolol Tartrate Other (See Comments)  . Omeprazole Other (See Comments)  . Pravachol     INTOLERANT  . Statins Other (See Comments)  . Vioxx [Rofecoxib]   . Zocor [Simvastatin]     INTOLERANT    Social History   Socioeconomic History  . Marital status: Widowed    Spouse name: Not on file  . Number of children: 3  . Years of education: Not on file  . Highest education level: Not on file  Occupational History  . Occupation: Retired Restaurant manager, fast food - primary  Social Needs  . Financial resource strain: Not hard at all  . Food insecurity    Worry: Never true    Inability: Never true  . Transportation needs    Medical: No    Non-medical: No  Tobacco Use  . Smoking status: Former Research scientist (life sciences)  . Smokeless tobacco: Never Used  . Tobacco comment: quit 45 years ago  Substance and Sexual Activity  . Alcohol use: Yes    Alcohol/week: 0.0 standard drinks    Comment: Rarely, 1-2 times a year  . Drug use: No  . Sexual activity: Not Currently    Birth control/protection: Surgical  Lifestyle  . Physical activity    Days per week: Not on file    Minutes per session: Not on file  . Stress: Not at all  Relationships  . Social Herbalist on phone: Not on file    Gets together: Not on file    Attends religious service: Not on file    Active member of club or organization: Not on file    Attends meetings of clubs or organizations: Not on file    Relationship status: Not on file  . Intimate partner violence    Fear of current or ex partner: Not on file    Emotionally abused: Not on  file    Physically abused: Not on file    Forced sexual activity: Not on file  Other Topics Concern  . Not  on file  Social History Narrative   Lives at [place]. Born in [place]. Travels frequently.      1 daughter, 2 step children      Regular Exercise -  NO   Daily Caffeine Use:  1 coffee             Family History  Problem Relation Age of Onset  . Coronary artery disease Father   . Kidney disease Father   . Cancer Sister   . Diabetes Other   . Cancer Mother        COLON  . Bladder Cancer Neg Hx   . Breast cancer Neg Hx     The following portions of the patient's history were reviewed and updated as appropriate: allergies, current medications, past family history, past medical history, past social history, past surgical history and problem list.  Review of Systems  ROS negative except as noted above. Information obtained from patient.   Objective:   BP 131/60   Pulse 80   Ht 5\' 2"  (1.575 m)   Wt 133 lb 3.2 oz (60.4 kg)   BMI 24.36 kg/m    CONSTITUTIONAL: Well-developed, well-nourished female in no acute distress.   PELVIC:  External Genitalia: Thickened erythema present   Vagina: Atrophy, swab collected   MUSCULOSKELETAL: Normal range of motion. No tenderness.  No cyanosis, clubbing, or edema.  Assessment:   1. Vulvar irritation  - Cervicovaginal ancillary only - Surgical pathology  2. Erythema of vagina  - Cervicovaginal ancillary only - Surgical pathology  3. Vaginal itching  - Cervicovaginal ancillary only - Surgical pathology  4. History of vaginitis  - Cervicovaginal ancillary only - Surgical pathology     Plan:   Labs today, see orders.   Vulvar biopsy, see notes below.   Rx: Mycolog, see orders.   Reviewed red flag symptoms and when to call.   RTC if symptoms worsen or fail to improve.    Diona Fanti, CNM [facility], [facility] 01/21/19 5:33 PM    Skin punch biopsy  After  obtaining informed consent, the area was prepped and draped in the usual fashion.   Anesthesia was obtained with 1% lidocaine after application of lidocaine jelly.    A full thickness punch biopsy was obtained with a 61mm punch. Hemostatic after application of silver nitrate.   Wound care discussed.   Specimen sent pathology, see orders.   Diona Fanti, CNM [facility], [facility] 01/21/19 5:36 PM

## 2019-01-24 NOTE — Assessment & Plan Note (Signed)
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

## 2019-01-24 NOTE — Assessment & Plan Note (Signed)
Coumadin stopped per patient preference due to lack of NBO suppley from pharmacy.  Tolerating Eliquis, recommended over Xarelto  By her cardiologist

## 2019-01-24 NOTE — Assessment & Plan Note (Signed)
Improved control with less dietary indulgences.   Given her age,  No treatment unless a1c is > 7.5.  Repeat assessment is due   Lab Results  Component Value Date   HGBA1C 6.6 (H) 04/12/2018   Lab Results  Component Value Date   MICROALBUR 0.9 04/12/2018    

## 2019-01-26 ENCOUNTER — Telehealth: Payer: Self-pay | Admitting: Certified Nurse Midwife

## 2019-01-26 LAB — CERVICOVAGINAL ANCILLARY ONLY
Bacterial vaginitis: NEGATIVE
Candida vaginitis: NEGATIVE

## 2019-01-26 NOTE — Telephone Encounter (Signed)
mychart message sent

## 2019-01-26 NOTE — Telephone Encounter (Signed)
The patient called and stated that she needs to speak with Sharyn Lull in regards to some issues she has going on with her. The patient also stated that she has some questions in regards to her results as well. Please advise.

## 2019-01-27 ENCOUNTER — Telehealth: Payer: Self-pay

## 2019-01-27 NOTE — Telephone Encounter (Signed)
Patient called the office to get test results-culture was neg per ML. Skin biopsy results not in office yet. Explained the procedure for informing patients of their results. She will wait patiently for those results.

## 2019-01-27 NOTE — Telephone Encounter (Signed)
Please contact patient via telephone. Sent MyChart message, but unsure if she still has access. Vaginal swab results were negative for infection. Still awaiting results of skin biopsy. Did new cream help with symptoms? JML

## 2019-01-28 NOTE — Progress Notes (Signed)
Please contact patient. Vulvar Biopsy came back with signs of chronic inflammation, but no signs of yeast. Continue medication. Let us know if you need anything else. Follow up with [place] or PCP as needed. JML   Vulva, biopsy - HYPERKERATOTIC SKIN WITH CHRONIC INFLAMMATION - NO DYSPLASIA OR MALIGNANCY IDENTIFIED - SEE COMMENT Microscopic Comment PAS stain is NEGATIVE for fungal elements.

## 2019-01-31 ENCOUNTER — Telehealth: Payer: Self-pay

## 2019-01-31 ENCOUNTER — Telehealth: Payer: Self-pay | Admitting: Certified Nurse Midwife

## 2019-01-31 NOTE — Telephone Encounter (Signed)
Copied from [place] [id number]. Topic: General - Other >> Jan 31, 2019 11:56 AM Burchel, Abbi R wrote: Reason for CRM: Pt requesting call back from [place] (Dr Lupita Dawn nurse).  Pt: [phone]

## 2019-01-31 NOTE — Telephone Encounter (Signed)
The patient called and requested to speak with Sharyn Lull. Pt did not want to disclose any other information. Please advise.

## 2019-01-31 NOTE — Telephone Encounter (Signed)
SS spoke and advised patient already, see 9/14 telephone note.

## 2019-01-31 NOTE — Telephone Encounter (Signed)
Patient informed of MLs orders.

## 2019-02-01 NOTE — Telephone Encounter (Signed)
Called pt and she stated that she was able to get in touch with the GYN and they prescribed her a cream that seems to be helping. Pt stated that she would prefer to see Dr. Derrel Nip if the symptoms return rather than going to either the GYN or the urologist. Spoke with Dr. Derrel Nip verbally and she stated that it would be fine.

## 2019-02-04 ENCOUNTER — Other Ambulatory Visit (INDEPENDENT_AMBULATORY_CARE_PROVIDER_SITE_OTHER): Payer: Medicare Other

## 2019-02-04 ENCOUNTER — Other Ambulatory Visit: Payer: Self-pay

## 2019-02-04 ENCOUNTER — Ambulatory Visit: Payer: Medicare Other

## 2019-02-04 DIAGNOSIS — E1140 Type 2 diabetes mellitus with diabetic neuropathy, unspecified: Secondary | ICD-10-CM | POA: Diagnosis not present

## 2019-02-04 DIAGNOSIS — Z7901 Long term (current) use of anticoagulants: Secondary | ICD-10-CM | POA: Diagnosis not present

## 2019-02-04 DIAGNOSIS — Z23 Encounter for immunization: Secondary | ICD-10-CM

## 2019-02-04 LAB — CBC WITH DIFFERENTIAL/PLATELET
Basophils Absolute: 0 10*3/uL (ref 0.0–0.1)
Basophils Relative: 0.3 % (ref 0.0–3.0)
Eosinophils Absolute: 0.1 10*3/uL (ref 0.0–0.7)
Eosinophils Relative: 0.7 % (ref 0.0–5.0)
HCT: 41.9 % (ref 36.0–46.0)
Hemoglobin: 13.9 g/dL (ref 12.0–15.0)
Lymphocytes Relative: 40.9 % (ref 12.0–46.0)
Lymphs Abs: 3.5 10*3/uL (ref 0.7–4.0)
MCHC: 33.3 g/dL (ref 30.0–36.0)
MCV: 92 fl (ref 78.0–100.0)
Monocytes Absolute: 0.5 10*3/uL (ref 0.1–1.0)
Monocytes Relative: 5.5 % (ref 3.0–12.0)
Neutro Abs: 4.5 10*3/uL (ref 1.4–7.7)
Neutrophils Relative %: 52.6 % (ref 43.0–77.0)
Platelets: 178 10*3/uL (ref 150.0–400.0)
RBC: 4.55 Mil/uL (ref 3.87–5.11)
RDW: 13.2 % (ref 11.5–15.5)
WBC: 8.6 10*3/uL (ref 4.0–10.5)

## 2019-02-04 LAB — COMPREHENSIVE METABOLIC PANEL
ALT: 16 U/L (ref 0–35)
AST: 17 U/L (ref 0–37)
Albumin: 3.7 g/dL (ref 3.5–5.2)
Alkaline Phosphatase: 72 U/L (ref 39–117)
BUN: 26 mg/dL — ABNORMAL HIGH (ref 6–23)
CO2: 27 mEq/L (ref 19–32)
Calcium: 9.2 mg/dL (ref 8.4–10.5)
Chloride: 100 mEq/L (ref 96–112)
Creatinine, Ser: 0.9 mg/dL (ref 0.40–1.20)
GFR: 58.26 mL/min — ABNORMAL LOW (ref >60.00)
Glucose, Bld: 265 mg/dL — ABNORMAL HIGH (ref 70–99)
Potassium: 3.1 mEq/L — ABNORMAL LOW (ref 3.5–5.1)
Sodium: 139 mEq/L (ref 135–145)
Total Bilirubin: 1.3 mg/dL — ABNORMAL HIGH (ref 0.2–1.2)
Total Protein: 6.3 g/dL (ref 6.0–8.3)

## 2019-02-04 LAB — HEMOGLOBIN A1C: Hgb A1c MFr Bld: 7.4 % — ABNORMAL HIGH (ref 4.6–6.5)

## 2019-02-06 ENCOUNTER — Other Ambulatory Visit: Payer: Self-pay | Admitting: Internal Medicine

## 2019-02-06 MED ORDER — POTASSIUM CHLORIDE CRYS ER 20 MEQ PO TBCR: 20.0000 meq | EXTENDED_RELEASE_TABLET | Freq: Every day | ORAL | 3 refills | Status: DC

## 2019-02-17 ENCOUNTER — Ambulatory Visit: Payer: Self-pay | Admitting: Internal Medicine

## 2019-02-17 NOTE — Telephone Encounter (Signed)
[name] scheduled patient a phone visit w/ Dr. Derrel Nip tomorrow at 8:30 am.

## 2019-02-17 NOTE — Telephone Encounter (Signed)
Pt reports lower right sided back pain. States radiates down right thigh to knee. Onset Monday "But I've had these flare ups for years."  States worsens at night, taking tylenol each HS which is effective; also using heat that helps.Denies any dysuria, no urgency or frequency. States "Maybe sciatica?"  Denies any numbness. Rates pain at 8/10 at night, "Better during day.' Requesting appt with Dr. Derrel Nip. States "I'd rather come in for appt."   Call transferred to practice, [name], for consideration of appt.  Care advise given per protocol, pt verbalizes understanding.  Reason for Disposition . [1] MODERATE back pain (e.g., interferes with normal activities) AND [2] present > 3 days  Answer Assessment - Initial Assessment Questions 1. ONSET: "When did the pain begin?"     Monday 2. LOCATION: "Where does it hurt?" (upper, mid or lower back)    Lower right side back 3. SEVERITY: "How bad is the pain?"  (e.g., Scale 1-10; mild, moderate, or severe)   - MILD (1-3): doesn't interfere with normal activities    - MODERATE (4-7): interferes with normal activities or awakens from sleep    - SEVERE (8-10): excruciating pain, unable to do any normal activities      8/10 at times, especially at night 4. PATTERN: "Is the pain constant?" (e.g., yes, no; constant, intermittent)     Varies, worse at night 5. RADIATION: "Does the pain shoot into your legs or elsewhere?"     Right leg to knee 6. CAUSE:  "What do you think is causing the back pain?"      "Maybe sciatica." 7. BACK OVERUSE:  "Any recent lifting of heavy objects, strenuous work or exercise?"     no 8. MEDICATIONS: "What have you taken so far for the pain?" (e.g., nothing, acetaminophen, NSAIDS)     Tylenol "Helps" and heat 9. NEUROLOGIC SYMPTOMS: "Do you have any weakness, numbness, or problems with bowel/bladder control?"    no 10. OTHER SYMPTOMS: "Do you have any other symptoms?" (e.g., fever, abdominal pain, burning with urination, blood  in urine)      No  Protocols used: BACK PAIN-A-AH

## 2019-02-18 ENCOUNTER — Ambulatory Visit (INDEPENDENT_AMBULATORY_CARE_PROVIDER_SITE_OTHER): Payer: Medicare Other | Admitting: Internal Medicine

## 2019-02-18 ENCOUNTER — Encounter: Payer: Self-pay | Admitting: Internal Medicine

## 2019-02-18 DIAGNOSIS — M5431 Sciatica, right side: Secondary | ICD-10-CM

## 2019-02-18 MED ORDER — PREDNISONE 10 MG PO TABS: ORAL_TABLET | ORAL | 0 refills | Status: DC

## 2019-02-18 NOTE — Progress Notes (Signed)
Telephone  Note  This visit type was conducted due to national recommendations for restrictions regarding the COVID-19 pandemic (e.g. social distancing).  This format is felt to be most appropriate for this patient at this time.  All issues noted in this document were discussed and addressed.  No physical exam was performed (except for noted visual exam findings with Video Visits).   I connected with@ on 02/18/19 at  8:30 AM EDT by telephone and verified that I am speaking with the correct person using two identifiers. Location patient: home Location provider: work or home office Persons participating in the virtual visit: patient, provider  I discussed the limitations, risks, security and privacy concerns of performing an evaluation and management service by telephone and the availability of in person appointments. I also discussed with the patient that there may be a patient responsible charge related to this service. The patient expressed understanding and agreed to proceed.  Reason for visit: back pain   HPI:  85 yr old independent female with osteopenia by last DEXA  In 20 recurrent UTI aggravated by chronic iatrogenic diarrhea presents with back pain that radiates to right thigh.  The back pain became moderate to severe  Monday night  in the lower back , started as a nagging pain that she has had before ,  Has been taking  1 ES tylenol and using heating pad at night only. The pain is not sharp and is Not midline,  But Located in the dimple on the right side f her sacrum and radiates midway down her right thigh.  . No unusual activity.  Denies dysuria,  Fevers,  But when the pain is moderate she feels slightly nauseated   ROS: See pertinent positives and negatives per HPI.  Past Medical History:  Diagnosis Date  . Allergy   . Atrial fibrillation ([place])   . CAD (coronary artery disease)   . Cancer (HCC)    UTERINE  . Chronic anticoagulation   . Chronic diarrhea   . Decubitus ulcer     sacral region  . Diabetes ([place])    diet controlled  . Dysuria   . Edema of lower extremity    mainly right foot, slightly in left foot.  . Fibrocystic breast disease   . GERD (gastroesophageal reflux disease)   . Glaucoma   . Glaucoma   . Hematuria   . Hemorrhoids   . History of colon polyps   . History of pancreatitis   . Hyperlipidemia   . Hypertension   . Hypokalemia   . IBS (irritable bowel syndrome)   . Microscopic hematuria   . Mitral valve regurgitation   . Osteoarthritis    fingers  . Pernicious anemia   . Plantar fasciitis   . Recurrent UTI   . Skin cancer   . Sleep apnea, obstructive   . Vaginal atrophy   . Vitamin D deficiency   . Yeast vaginitis     Past Surgical History:  Procedure Laterality Date  . ABDOMINAL HYSTERECTOMY  1980's  . ABDOMINAL SURGERY     for villous polyp,,,many years ago  . APPENDECTOMY  1940's  . ASCAD, s/p PTCA  11/28/2005   MID LESION   . BREAST BIOPSY Left 1970's  . CARPAL TUNNEL RELEASE Right 12/13/2015   Procedure: CARPAL TUNNEL RELEASE;  Surgeon: Thornton Park, MD;  Location: [facility] ORS;  Service: Orthopedics;  Laterality: Right;  . COLECTOMY  2015  . PACEMAKER PLACEMENT    . radation  for uterine cance  . REFRACTIVE SURGERY     for bilateral glaumoma  . TONSILLECTOMY  1936    Family History  Problem Relation Age of Onset  . Coronary artery disease Father   . Kidney disease Father   . Cancer Sister   . Diabetes Other   . Cancer Mother        COLON  . Bladder Cancer Neg Hx   . Breast cancer Neg Hx     SOCIAL HX:  reports that she has quit smoking. She has never used smokeless tobacco. She reports current alcohol use. She reports that she does not use drugs.   Current Outpatient Medications:  .  apixaban (ELIQUIS) 2.5 MG TABS tablet, Take 2.5 mg by mouth 2 (two) times daily., Disp: , Rfl:  .  cholecalciferol (VITAMIN D) 1000 UNITS tablet, Take 1,000 Units by mouth daily., Disp: , Rfl:  .  clobetasol cream  (TEMOVATE) AB-123456789 %, Apply 1 application topically 2 (two) times daily., Disp: 30 g, Rfl: 0 .  Cranberry 200 MG CAPS, Take 1 capsule (200 mg total) by mouth daily., Disp: 30 capsule, Rfl: 2 .  cyanocobalamin (,VITAMIN B-12,) 1000 MCG/ML injection, INJECT 1 ML  INTO THE MUSCLE EVERY 14 DAYS., Disp: 10 mL, Rfl: 0 .  diltiazem (CARDIZEM CD) 180 MG 24 hr capsule, TAKE 1 CAPSULE BY MOUTH EVERY MORNING, Disp: 90 capsule, Rfl: 1 .  diphenoxylate-atropine (LOMOTIL) 2.5-0.025 MG tablet, TAKE ONE TABLET BY MOUTH TWICE A DAY AS NEEDED FOR DIARRHEA, Disp: 60 tablet, Rfl: 0 .  estradiol (ESTRACE) 0.1 MG/GM vaginal cream, Place 2 g vaginally at bedtime. For 2 weeks,  Then reduce dose to 1 gram every 3-4 days, Disp: 42.5 g, Rfl: 5 .  hydrocortisone-pramoxine (ANALPRAM-HC) 2.5-1 % rectal cream, APPLY RECTALLY THREE TIMES DAILY (Patient taking differently: Place 1 application rectally as needed. ), Disp: 30 g, Rfl: 0 .  Hypromellose (GENTEAL) 0.3 % SOLN, Place 1 drop into both eyes daily as needed (dryness). , Disp: , Rfl:  .  ketoconazole (NIZORAL) 2 % cream, Apply 1 application topically daily as needed. , Disp: , Rfl: 1 .  metoprolol succinate (TOPROL-XL) 25 MG 24 hr tablet, Take 1 tablet (25 mg total) by mouth at bedtime., Disp: 90 tablet, Rfl: 1 .  nystatin-triamcinolone (MYCOLOG II) cream, Apply 1 application topically 2 (two) times daily., Disp: 30 g, Rfl: 0 .  potassium chloride SA (K-DUR) 20 MEQ tablet, Take 1 tablet (20 mEq total) by mouth daily., Disp: 30 tablet, Rfl: 3 .  predniSONE (DELTASONE) 10 MG tablet, 6 tablets all at once on Day 1,  Then taper by 1 tablet daily until gone, Disp: 21 tablet, Rfl: 0  EXAM:  VITALS per patient if applicable:  GENERAL: alert, oriented, appears well and in no acute distress  HEENT: atraumatic, conjunttiva clear, no obvious abnormalities on inspection of external nose and ears  NECK: normal movements of the head and neck  LUNGS: on inspection no signs of  respiratory distress, breathing rate appears normal, no obvious gross SOB, gasping or wheezing  CV: no obvious cyanosis  MS: moves all visible extremities without noticeable abnormality  PSYCH/NEURO: pleasant and cooperative, no obvious depression or anxiety, speech and thought processing grossly intact  ASSESSMENT AND PLAN:  Discussed the following assessment and plan:  Sciatica of right side  Sciatica of right side Episodic,  With symptoms currently  relieved transiently with tylenol. She denies any symptoms suggestive of vertebral or sacral fracture, and has not  fallen. She cannot take NSAIDs due to chronic anticoagulation .  Advised her to use 2000 mg tylenol daily in divided doses and start a prednisone taper tomorrow morning . Of pain has not improved by Monday will start workup with lumbosacral spine films     I discussed the assessment and treatment plan with the patient. The patient was provided an opportunity to ask questions and all were answered. The patient agreed with the plan and demonstrated an understanding of the instructions.   The patient was advised to call back or seek an in-person evaluation if the symptoms worsen or if the condition fails to improve as anticipated.  I provided  25 minutes of non-face-to-face time during this encounter reviewing patient's current problems , her   DEXA scan from 2017, and providing counseling on the above mentioned problems , and coordination  of care .  Crecencio Mc, MD

## 2019-02-18 NOTE — Assessment & Plan Note (Addendum)
Episodic,  With symptoms currently  relieved transiently with tylenol. She denies any symptoms suggestive of vertebral or sacral fracture, and has not fallen. She cannot take NSAIDs due to chronic anticoagulation .  Advised her to use 2000 mg tylenol daily in divided doses and start a prednisone taper tomorrow morning . Of pain has not improved by Monday will start workup with lumbosacral spine films

## 2019-02-28 ENCOUNTER — Ambulatory Visit: Payer: Medicare Other | Admitting: Urology

## 2019-03-21 ENCOUNTER — Telehealth: Payer: Self-pay | Admitting: *Deleted

## 2019-03-21 DIAGNOSIS — I340 Nonrheumatic mitral (valve) insufficiency: Secondary | ICD-10-CM | POA: Diagnosis not present

## 2019-03-21 DIAGNOSIS — I495 Sick sinus syndrome: Secondary | ICD-10-CM | POA: Diagnosis not present

## 2019-03-21 DIAGNOSIS — I480 Paroxysmal atrial fibrillation: Secondary | ICD-10-CM | POA: Diagnosis not present

## 2019-03-21 DIAGNOSIS — E782 Mixed hyperlipidemia: Secondary | ICD-10-CM | POA: Diagnosis not present

## 2019-03-21 DIAGNOSIS — E1140 Type 2 diabetes mellitus with diabetic neuropathy, unspecified: Secondary | ICD-10-CM | POA: Diagnosis not present

## 2019-03-21 DIAGNOSIS — I10 Essential (primary) hypertension: Secondary | ICD-10-CM | POA: Diagnosis not present

## 2019-03-21 DIAGNOSIS — R300 Dysuria: Secondary | ICD-10-CM

## 2019-03-21 DIAGNOSIS — I2510 Atherosclerotic heart disease of native coronary artery without angina pectoris: Secondary | ICD-10-CM | POA: Diagnosis not present

## 2019-03-21 NOTE — Telephone Encounter (Signed)
Letter and orders have been placed in quick sign folder for signature.

## 2019-03-21 NOTE — Telephone Encounter (Signed)
Copied from [place] [id number]. Topic: General - Inquiry >> Mar 21, 2019  1:47 PM Mathis Bud wrote: Reason for CRM: patient is calling stating she has UTI.  Patient states she is having same issues. Patient is requesting a call back from nurse. Call back [phone] Scheduled for friday patient has itching and

## 2019-03-21 NOTE — Telephone Encounter (Signed)
Scheduled patient for Friday at 1130. Telephone if she wants UA let me know I can call back or we can send orders to Nurse at [facility]

## 2019-03-24 DIAGNOSIS — D485 Neoplasm of uncertain behavior of skin: Secondary | ICD-10-CM | POA: Diagnosis not present

## 2019-03-24 DIAGNOSIS — Z85828 Personal history of other malignant neoplasm of skin: Secondary | ICD-10-CM | POA: Diagnosis not present

## 2019-03-24 DIAGNOSIS — D2262 Melanocytic nevi of left upper limb, including shoulder: Secondary | ICD-10-CM | POA: Diagnosis not present

## 2019-03-24 DIAGNOSIS — Z08 Encounter for follow-up examination after completed treatment for malignant neoplasm: Secondary | ICD-10-CM | POA: Diagnosis not present

## 2019-03-25 ENCOUNTER — Telehealth: Payer: Self-pay | Admitting: *Deleted

## 2019-03-25 ENCOUNTER — Encounter: Payer: Self-pay | Admitting: Internal Medicine

## 2019-03-25 ENCOUNTER — Ambulatory Visit (INDEPENDENT_AMBULATORY_CARE_PROVIDER_SITE_OTHER): Payer: Medicare Other | Admitting: Internal Medicine

## 2019-03-25 ENCOUNTER — Other Ambulatory Visit: Payer: Self-pay

## 2019-03-25 DIAGNOSIS — R300 Dysuria: Secondary | ICD-10-CM | POA: Diagnosis not present

## 2019-03-25 DIAGNOSIS — N898 Other specified noninflammatory disorders of vagina: Secondary | ICD-10-CM

## 2019-03-25 MED ORDER — TRIAMCINOLONE ACETONIDE 0.1 % EX CREA: 1.0000 "application " | TOPICAL_CREAM | Freq: Two times a day (BID) | CUTANEOUS | 0 refills | Status: DC

## 2019-03-25 NOTE — Telephone Encounter (Signed)
Patient is calling again to check the status of her request.  She would like a call from the nurse or doctor as soon as the orders are sent.  CB# [phone]

## 2019-03-25 NOTE — Assessment & Plan Note (Addendum)
Request for empiric antibiotics denies.  Urine culture and UA were ordered on Nov 2 but not done yet.  Diagnosis explained,  That this is a chronic condition that cannot be cured ,  Only managed .  Advised to submit urine, resume Mycolog.   Refilling as triamcinolone to use twice daily for 3 weeks,  Then reduce use to twice weekly

## 2019-03-25 NOTE — Telephone Encounter (Signed)
I have printed given to Butch Penny.

## 2019-03-25 NOTE — Telephone Encounter (Signed)
Copied from [place] [id number]. Topic: General - Inquiry >> Mar 25, 2019  1:07 PM Berneta Levins wrote: Reason for CRM:   Gaspar Bidding from the [facility]  States that pt has told him that there was an order that was sent over for a urnalysis and culture.  However [facility] hasn't received this at all.  Gaspar Bidding states that he will be happy to do but orders would need to be sent over. Fax number:  [phone] Exie Parody can be reached at [phone] - please call if fax is sent

## 2019-03-25 NOTE — Telephone Encounter (Signed)
Orders signed by Dr. Derrel Nip and faxed.

## 2019-03-25 NOTE — Progress Notes (Signed)
Telephone Note  This visit type was conducted due to national recommendations for restrictions regarding the COVID-19 pandemic (e.g. social distancing).  This format is felt to be most appropriate for this patient at this time.  All issues noted in this document were discussed and addressed.  No physical exam was performed (except for noted visual exam findings with Video Visits).   I connected with@ on 03/25/19 at 11:30 AM EST by telephone and verified that I am speaking with the correct person using two identifiers. Location patient: home Location provider: work or home office Persons participating in the virtual visit: patient, provider  I discussed the limitations, risks, security and privacy concerns of performing an evaluation and management service by telephone and the availability of in person appointments. I also discussed with the patient that there may be a patient responsible charge related to this service. The patient expressed understanding and agreed to proceed.  Reason for visit: vaginal itching  HPI:  85 yr old female with history of atrophic vagintis and lichen sclerosis managed with estradiol cream  And triamcinolone presents with recurrent itching,  Requesting treatment for UTI.  Patient underwent vulvar biopsy in September for same which showed only inflammation,  No yeast components and diagnosed with lichen sclerosis .  Stopped using Mycolog  Cream about 4 weeks ago because she was not given any long term instructions and the tube directions  On the Mycolog said not to use more than 21 days.   Denies dysuria, suprapubic pain, nausea , fevers, and flank pain    ROS: See pertinent positives and negatives per HPI.  Past Medical History:  Diagnosis Date  . Allergy   . Atrial fibrillation ([place])   . CAD (coronary artery disease)   . Cancer (HCC)    UTERINE  . Chronic anticoagulation   . Chronic diarrhea   . Decubitus ulcer    sacral region  . Diabetes ([place])    diet  controlled  . Dysuria   . Edema of lower extremity    mainly right foot, slightly in left foot.  . Fibrocystic breast disease   . GERD (gastroesophageal reflux disease)   . Glaucoma   . Glaucoma   . Hematuria   . Hemorrhoids   . History of colon polyps   . History of pancreatitis   . Hyperlipidemia   . Hypertension   . Hypokalemia   . IBS (irritable bowel syndrome)   . Microscopic hematuria   . Mitral valve regurgitation   . Osteoarthritis    fingers  . Pernicious anemia   . Plantar fasciitis   . Recurrent UTI   . Skin cancer   . Sleep apnea, obstructive   . Vaginal atrophy   . Vitamin D deficiency   . Yeast vaginitis     Past Surgical History:  Procedure Laterality Date  . ABDOMINAL HYSTERECTOMY  1980's  . ABDOMINAL SURGERY     for villous polyp,,,many years ago  . APPENDECTOMY  1940's  . ASCAD, s/p PTCA  11/28/2005   MID LESION   . BREAST BIOPSY Left 1970's  . CARPAL TUNNEL RELEASE Right 12/13/2015   Procedure: CARPAL TUNNEL RELEASE;  Surgeon: Thornton Park, MD;  Location: [facility] ORS;  Service: Orthopedics;  Laterality: Right;  . COLECTOMY  2015  . PACEMAKER PLACEMENT    . radation     for uterine cance  . REFRACTIVE SURGERY     for bilateral glaumoma  . TONSILLECTOMY  1936    Family History  Problem Relation Age of Onset  . Coronary artery disease Father   . Kidney disease Father   . Cancer Sister   . Diabetes Other   . Cancer Mother        COLON  . Bladder Cancer Neg Hx   . Breast cancer Neg Hx     SOCIAL HX:  reports that she has quit smoking. She has never used smokeless tobacco. She reports current alcohol use. She reports that she does not use drugs. lives in independent living at [place]   Current Outpatient Medications:  .  apixaban (ELIQUIS) 2.5 MG TABS tablet, Take 2.5 mg by mouth 2 (two) times daily., Disp: , Rfl:  .  cholecalciferol (VITAMIN D) 1000 UNITS tablet, Take 1,000 Units by mouth daily., Disp: , Rfl:  .  clobetasol  cream (TEMOVATE) AB-123456789 %, Apply 1 application topically 2 (two) times daily., Disp: 30 g, Rfl: 0 .  Cranberry 200 MG CAPS, Take 1 capsule (200 mg total) by mouth daily., Disp: 30 capsule, Rfl: 2 .  cyanocobalamin (,VITAMIN B-12,) 1000 MCG/ML injection, INJECT 1 ML  INTO THE MUSCLE EVERY 14 DAYS., Disp: 10 mL, Rfl: 0 .  diltiazem (CARDIZEM CD) 180 MG 24 hr capsule, TAKE 1 CAPSULE BY MOUTH EVERY MORNING, Disp: 90 capsule, Rfl: 1 .  diphenoxylate-atropine (LOMOTIL) 2.5-0.025 MG tablet, TAKE ONE TABLET BY MOUTH TWICE A DAY AS NEEDED FOR DIARRHEA, Disp: 60 tablet, Rfl: 0 .  estradiol (ESTRACE) 0.1 MG/GM vaginal cream, Place 2 g vaginally at bedtime. For 2 weeks,  Then reduce dose to 1 gram every 3-4 days, Disp: 42.5 g, Rfl: 5 .  furosemide (LASIX) 20 MG tablet, Take 20 mg by mouth daily., Disp: , Rfl:  .  hydrocortisone-pramoxine (ANALPRAM-HC) 2.5-1 % rectal cream, APPLY RECTALLY THREE TIMES DAILY (Patient taking differently: Place 1 application rectally as needed. ), Disp: 30 g, Rfl: 0 .  Hypromellose (GENTEAL) 0.3 % SOLN, Place 1 drop into both eyes daily as needed (dryness). , Disp: , Rfl:  .  ketoconazole (NIZORAL) 2 % cream, Apply 1 application topically daily as needed. , Disp: , Rfl: 1 .  metoprolol succinate (TOPROL-XL) 25 MG 24 hr tablet, Take 1 tablet (25 mg total) by mouth at bedtime., Disp: 90 tablet, Rfl: 1 .  nystatin-triamcinolone (MYCOLOG II) cream, Apply 1 application topically 2 (two) times daily., Disp: 30 g, Rfl: 0 .  potassium chloride SA (K-DUR) 20 MEQ tablet, Take 1 tablet (20 mEq total) by mouth daily. (Patient not taking: Reported on 03/25/2019), Disp: 30 tablet, Rfl: 3 .  predniSONE (DELTASONE) 10 MG tablet, 6 tablets all at once on Day 1,  Then taper by 1 tablet daily until gone (Patient not taking: Reported on 03/25/2019), Disp: 21 tablet, Rfl: 0 .  triamcinolone cream (KENALOG) 0.1 %, Apply 1 application topically 2 (two) times daily. To vulva until itching resolves,  Then reduce  use to twice weekly, Disp: 30 g, Rfl: 0  EXAM:   General impression: alert, cooperative and articulate.  No signs of being in distress  Lungs: speech is fluent sentence length suggests that patient is not short of breath and not punctuated by cough, sneezing or sniffing. Marland Kitchen   Psych: affect normal.  speech is articulate and non pressured .  Denies suicidal thoughts   ASSESSMENT AND PLAN:  Discussed the following assessment and plan:  Vaginal itching  Vaginal itching Request for empiric antibiotics denies.  Urine culture and UA were ordered on Nov 2 but not  done yet.  Diagnosis explained,  That this is a chronic condition that cannot be cured ,  Only managed .  Advised to submit urine, resume Mycolog.   Refilling as triamcinolone to use twice daily for 3 weeks,  Then reduce use to twice weekly     I discussed the assessment and treatment plan with the patient. The patient was provided an opportunity to ask questions and all were answered. The patient agreed with the plan and demonstrated an understanding of the instructions.   The patient was advised to call back or seek an in-person evaluation if the symptoms worsen or if the condition fails to improve as anticipated.    I provided  25 minutes of non-face-to-face time during this encounter reviewing patient's current problems and post surgeries.  Providing counseling on the above mentioned problems , and coordination  of care .  Crecencio Mc, MD

## 2019-03-25 NOTE — Telephone Encounter (Signed)
Patient is aware that orders have been faxed.

## 2019-03-29 ENCOUNTER — Other Ambulatory Visit: Payer: Self-pay | Admitting: Internal Medicine

## 2019-03-29 DIAGNOSIS — I480 Paroxysmal atrial fibrillation: Secondary | ICD-10-CM | POA: Diagnosis not present

## 2019-03-30 ENCOUNTER — Telehealth: Payer: Self-pay | Admitting: Internal Medicine

## 2019-03-30 DIAGNOSIS — N390 Urinary tract infection, site not specified: Secondary | ICD-10-CM

## 2019-03-30 MED ORDER — SULFAMETHOXAZOLE-TRIMETHOPRIM 800-160 MG PO TABS: 1.0000 | ORAL_TABLET | Freq: Two times a day (BID) | ORAL | 0 refills | Status: DC

## 2019-03-30 NOTE — Telephone Encounter (Signed)
  Your labs confirm that you have a UTI, .  I am calling  In the antibiotic  Septra DS  to take twice daily for 5 days   Please take a probiotic ( Align, Floraque or Culturelle), the generic version of one of these over the counter medications, or an alternative form (kombucha,  Yogurt, or another dietary source) for a minimum of 3 weeks to prevent a serious antibiotic associated diarrhea  Called clostridium dificile colitis.  Taking a probiotic may also prevent vaginitis due to yeast infections and can be continued indefinitely if you feel that it improves your digestion or your elimination (bowels).

## 2019-03-30 NOTE — Telephone Encounter (Signed)
Tried to reach patient by phone no answer left message tor patient to call office. [facility] nurse may advise.

## 2019-03-31 ENCOUNTER — Telehealth: Payer: Self-pay | Admitting: *Deleted

## 2019-03-31 ENCOUNTER — Other Ambulatory Visit: Payer: Self-pay | Admitting: Internal Medicine

## 2019-03-31 DIAGNOSIS — K529 Noninfective gastroenteritis and colitis, unspecified: Secondary | ICD-10-CM

## 2019-03-31 NOTE — Telephone Encounter (Signed)
Reviewed UTI note with patient. Stated she understands and will comply.

## 2019-03-31 NOTE — Telephone Encounter (Signed)
Ok to fill 

## 2019-03-31 NOTE — Telephone Encounter (Signed)
Refilled.  Looked like he had plenty of refills on it!

## 2019-03-31 NOTE — Telephone Encounter (Signed)
See addendum to yesterday's TE. Reviewed information with patient.

## 2019-04-05 ENCOUNTER — Other Ambulatory Visit: Payer: Self-pay

## 2019-04-05 DIAGNOSIS — K529 Noninfective gastroenteritis and colitis, unspecified: Secondary | ICD-10-CM

## 2019-04-05 NOTE — Telephone Encounter (Signed)
Refilled: 09/16/2018 Last OV: 03/25/2019 Next OV: not scheduled

## 2019-04-06 MED ORDER — DIPHENOXYLATE-ATROPINE 2.5-0.025 MG PO TABS: ORAL_TABLET | ORAL | 3 refills | Status: DC

## 2019-04-07 NOTE — Telephone Encounter (Signed)
The facility will need an order with a signature so I have printed it and placed in the quick sign folder for signature.

## 2019-04-07 NOTE — Telephone Encounter (Signed)
Patient called and said that the UTI she has had has come back and would like to talk to Dr. Vanita Ingles about this cause she is very concern cause it doesn't want to go away. Please call patient back, thanks.

## 2019-04-07 NOTE — Telephone Encounter (Signed)
Would you like for me to have the pt collect another urine sample at her facility to see if the UTI has returned?

## 2019-04-07 NOTE — Telephone Encounter (Signed)
Yes,  I will always need a repeat UA and culture before I will treat for UTI .  Labs ordered but I ordered them for here. So please reorderd,  however they need to be done or her to get them done

## 2019-04-07 NOTE — Addendum Note (Signed)
Addended by: Crecencio Mc on: 04/07/2019 04:50 PM   Modules accepted: Orders

## 2019-04-08 NOTE — Telephone Encounter (Signed)
Spoke with pt to let her know that the orders has been faxed over to the nurse, Aaron Edelman at [facility]. Pt gave a verbal understanding.

## 2019-04-08 NOTE — Telephone Encounter (Signed)
Patient called back to check on the status for the order for the UA and culture.  Please call pt back for update.

## 2019-04-11 ENCOUNTER — Other Ambulatory Visit: Payer: Self-pay | Admitting: Internal Medicine

## 2019-04-11 DIAGNOSIS — K529 Noninfective gastroenteritis and colitis, unspecified: Secondary | ICD-10-CM

## 2019-04-11 DIAGNOSIS — R300 Dysuria: Secondary | ICD-10-CM | POA: Diagnosis not present

## 2019-04-18 ENCOUNTER — Telehealth: Payer: Self-pay

## 2019-04-18 NOTE — Telephone Encounter (Signed)
LMTCB. Please transfer pt to our office to discuss the lab results we received and ask pt if she is having any symptoms.

## 2019-04-19 NOTE — Telephone Encounter (Signed)
Patient c/o of a little itching, no burning, feeling of fullness but little urine.  No fever or chills.  Pt wants lab results and would like to know if she needs to be on medication.

## 2019-04-19 NOTE — Telephone Encounter (Signed)
Pt is wanting a call back, she is miserable and waited all day, please call first thing in the am at [phone]

## 2019-04-19 NOTE — Telephone Encounter (Signed)
Her symptoms do NOT suggest a UTI.   the nurse did not run all of the tests I ordered,  She just did a culture,I will NOT treat based on culture because it does not differentiate between infection and coloniziation   If she develops burning and frequency I will treat but not itching because that is due to atrophic vaginitis

## 2019-04-19 NOTE — Telephone Encounter (Addendum)
Patient returned call please leave a detail message and would like a call back this morning

## 2019-04-19 NOTE — Telephone Encounter (Signed)
Patient notified and voiced understanding.

## 2019-04-19 NOTE — Telephone Encounter (Signed)
Tried to call patient.  Line busy

## 2019-04-20 DIAGNOSIS — I10 Essential (primary) hypertension: Secondary | ICD-10-CM | POA: Diagnosis not present

## 2019-05-27 ENCOUNTER — Ambulatory Visit (INDEPENDENT_AMBULATORY_CARE_PROVIDER_SITE_OTHER): Payer: Medicare Other | Admitting: Urology

## 2019-05-27 ENCOUNTER — Other Ambulatory Visit: Payer: Self-pay

## 2019-05-27 ENCOUNTER — Encounter: Payer: Self-pay | Admitting: Urology

## 2019-05-27 VITALS — BP 148/65 | HR 101 | Ht 62.0 in | Wt 132.0 lb

## 2019-05-27 DIAGNOSIS — N390 Urinary tract infection, site not specified: Secondary | ICD-10-CM | POA: Diagnosis not present

## 2019-05-27 LAB — MICROSCOPIC EXAMINATION: Bacteria, UA: NONE SEEN

## 2019-05-27 LAB — URINALYSIS, COMPLETE
Bilirubin, UA: NEGATIVE
Glucose, UA: NEGATIVE
Ketones, UA: NEGATIVE
Nitrite, UA: NEGATIVE
Protein,UA: NEGATIVE
Specific Gravity, UA: 1.02 (ref 1.005–1.030)
Urobilinogen, Ur: 0.2 mg/dL (ref 0.2–1.0)
pH, UA: 6 (ref 5.0–7.5)

## 2019-05-27 NOTE — Patient Instructions (Signed)
Renal Scan  A renal scan is a procedure that is used to check for problems in the kidneys. The kidneys are the organs that help filter blood and keep it clean. They move waste out of your blood and into your urine so it can be removed from the body. In this procedure, a small amount of radioactive material (tracer) is injected into your blood. The tracer will travel through your bloodstream and reach your kidneys. A scanner with a camera that detects the radioactive tracer is used to examine the kidneys. The tracer makes it easy for your health care provider to see problems, if there are any. There are several types of renal scans:  Renal structural scan. This type is used to check for problems that may change the normal structure of the kidney. These problems include cysts, tumors, abscesses, and certain disorders that are present at birth (congenital).  Renal function scan. This type is used to check for problems that affect kidney function, such as inflammation, low blood supply, or kidney failure.  Renal blood flow scan. This type is used to evaluate the blood flow to each kidney. It can help to identify any narrowing of the arteries that carry blood to the kidneys (renal artery stenosis).  Renal hypertension scan. This type is used to identify the cause of high blood pressure that results from problems in the renal arteries (renovascular hypertension). It can help to identify any narrowing or blockages in these arteries.  Renal obstruction scan. This type is used to identify any obstruction in the area where waste moves out of the kidney and into the urinary tract. Tell a health care provider about:  Any allergies you have.  All medicines you are taking, including vitamins, herbs, eye drops, creams, and over-the-counter medicines.  Any blood disorders you have.  Any surgeries you have had.  Any medical conditions you have.  If you are breastfeeding.  If you are pregnant or you think  that you may be pregnant. What are the risks? Generally, this is a safe procedure. However, problems may occur, including:  Exposure to radiation (a small amount).  Allergic reaction to the radioactive material. This is rare. What happens before the procedure?  You may be asked to drink extra fluids for 24 hours before the exam. Follow your health care provider's instructions.  Ask your health care provider about: ? Changing or stopping your regular medicines. This is especially important if you are taking diabetes medicines or blood thinners. ? Taking medicines such as aspirin and ibuprofen. These medicines can thin your blood and affect kidney function. Do not take these medicines unless your health care provider tells you to take them. ? Taking over-the-counter medicines, vitamins, herbs, and supplements. What happens during the procedure?  You will lie on a scanner table.  An IV line will be inserted into one of your veins. The IV line will remain in place for the entire exam.  A small amount of radioactive tracer will be injected through the IV line.  Images will be taken of your kidney area. The images will be taken at different intervals depending on what is being checked.  In some cases, a second type of tracer or a medicine may be injected. Then, more scans of the kidney will be done. The procedure may vary among health care providers and hospitals. What happens after the procedure?  Return to your normal activities and your normal diet as directed by your health care provider.  The radioactive   tracer will leave your body over the next few days. Drink enough fluid to keep your urine pale yellow. This will help flush the tracer out of your body.  Ask your health care provider, or the department that is doing the test: ? When will my results be ready? ? How will I get my results? ? What are my treatment options? ? What other tests do I need? ? What are my next  steps?  Talk with your health care provider about what your results mean. Summary  A renal scan is a procedure that is used to check for problems in the kidneys. The kidneys are the organs that help filter blood and keep it clean.  A renal scan can help check blood flow to the kidney and help look for cysts, tumors, or infection in the kidneys.  Tell your health care provider about any prescription or over-the-counter medicines, vitamins, herbs, and supplements that you are taking. Some of these can affect kidney function and may interfere with your test results.  Talk with your health care provider about what your results mean. This information is not intended to replace advice given to you by your health care provider. Make sure you discuss any questions you have with your health care provider. Document Revised: 06/12/2017 Document Reviewed: 06/12/2017 Elsevier Patient Education  2020 Elsevier Inc.  

## 2019-05-27 NOTE — Progress Notes (Signed)
05/27/2019 10:30 AM   [name] [birth year] [id number]  Referring provider: Crecencio Mc, MD [place],  [place] [address]  Chief Complaint  Patient presents with  . Recurrent UTI    HPI: Kara [name] is a 86 y.o. female seen at the request of Dr. Derrel Nip for evaluation of recurrent UTIs.  She had positive urine cultures in July, August and November 2020 for Klebsiella with associated pyuria.  Her symptoms were dysuria and vaginal itching.  Her vaginal itching is secondary to lichen sclerosus and atrophic vaginitis.  She had a follow-up urinalysis in late November which was clear.  A urine culture was ordered which grew Enterococcus however this was felt to most likely represent colonization.  She currently has no dysuria but complains of external vaginal itching.   PMH: Past Medical History:  Diagnosis Date  . Allergy   . Atrial fibrillation ([facility])   . CAD (coronary artery disease)   . Cancer ([facility])    UTERINE  . Chronic anticoagulation   . Chronic diarrhea   . Decubitus ulcer    sacral region  . Diabetes ([facility])    diet controlled  . Dysuria   . Edema of lower extremity    mainly right foot, slightly in left foot.  . Fibrocystic breast disease   . GERD (gastroesophageal reflux disease)   . Glaucoma   . Glaucoma   . Hematuria   . Hemorrhoids   . History of colon polyps   . History of pancreatitis   . Hyperlipidemia   . Hypertension   . Hypokalemia   . IBS (irritable bowel syndrome)   . Microscopic hematuria   . Mitral valve regurgitation   . Osteoarthritis    fingers  . Pernicious anemia   . Plantar fasciitis   . Recurrent UTI   . Skin cancer   . Sleep apnea, obstructive   . Vaginal atrophy   . Vitamin D deficiency   . Yeast vaginitis     Surgical History: Past Surgical History:  Procedure Laterality Date  . ABDOMINAL HYSTERECTOMY  1980's  . ABDOMINAL SURGERY     for villous polyp,,,many years ago  . APPENDECTOMY  1940's   . ASCAD, s/p PTCA  11/28/2005   MID LESION   . BREAST BIOPSY Left 1970's  . CARPAL TUNNEL RELEASE Right 12/13/2015   Procedure: CARPAL TUNNEL RELEASE;  Surgeon: Thornton Park, MD;  Location: [facility] ORS;  Service: Orthopedics;  Laterality: Right;  . COLECTOMY  2015  . PACEMAKER PLACEMENT    . radation     for uterine cance  . REFRACTIVE SURGERY     for bilateral glaumoma  . TONSILLECTOMY  1936    Home Medications:  Allergies as of 05/27/2019      Reactions   Baycol [cerivastatin Sodium]    Cardizem [diltiazem Hcl]    LOWER EXTREMITY EDEMA   Crestor [rosuvastatin Calcium] Other (See Comments)   INTOLERANT   Dronedarone Other (See Comments)   Fatigue   Metoprolol Tartrate Other (See Comments)   Omeprazole Other (See Comments)   Pravachol    INTOLERANT   Statins Other (See Comments)   Vioxx [rofecoxib]    Zocor [simvastatin]    INTOLERANT      Medication List       Accurate as of May 27, 2019 10:30 AM. If you have any questions, ask your nurse or doctor.        STOP taking these medications   potassium  chloride SA 20 MEQ tablet Commonly known as: KLOR-CON Stopped by: Abbie Sons, MD   sulfamethoxazole-trimethoprim 800-160 MG tablet Commonly known as: BACTRIM DS Stopped by: Abbie Sons, MD     TAKE these medications   apixaban 2.5 MG Tabs tablet Commonly known as: ELIQUIS Take 2.5 mg by mouth 2 (two) times daily.   cholecalciferol 1000 units tablet Commonly known as: VITAMIN D Take 1,000 Units by mouth daily.   clobetasol cream 0.05 % Commonly known as: TEMOVATE Apply 1 application topically 2 (two) times daily.   Cranberry 200 MG Caps Take 1 capsule (200 mg total) by mouth daily.   cyanocobalamin 1000 MCG/ML injection Commonly known as: (VITAMIN B-12) INJECT 1ML INTO THE MUSCLE EVERY 14 DAYS   diltiazem 180 MG 24 hr capsule Commonly known as: TIAZAC Take by mouth.   diltiazem 180 MG 24 hr capsule Commonly known as: CARDIZEM CD TAKE 1  CAPSULE BY MOUTH EVERY MORNING   diphenoxylate-atropine 2.5-0.025 MG tablet Commonly known as: LOMOTIL TAKE ONE TABLET BY MOUTH TWICE A DAY AS NEEDED FOR DIARRHEA   estradiol 0.1 MG/GM vaginal cream Commonly known as: ESTRACE Place 2 g vaginally at bedtime. For 2 weeks,  Then reduce dose to 1 gram every 3-4 days   furosemide 20 MG tablet Commonly known as: LASIX Take 20 mg by mouth daily.   GenTeal 0.3 % Soln Generic drug: Hypromellose Place 1 drop into both eyes daily as needed (dryness).   Hydrocortisone Ace-Pramoxine 2.5-1 % Crea Place rectally.   hydrocortisone-pramoxine 2.5-1 % rectal cream Commonly known as: ANALPRAM-HC APPLY RECTALLY THREE TIMES DAILY What changed: See the new instructions.   ketoconazole 2 % cream Commonly known as: NIZORAL Apply 1 application topically daily as needed.   metoprolol succinate 25 MG 24 hr tablet Commonly known as: TOPROL-XL Take 1 tablet (25 mg total) by mouth at bedtime.   nystatin-triamcinolone cream Commonly known as: MYCOLOG II Apply 1 application topically 2 (two) times daily.   potassium chloride 10 MEQ tablet Commonly known as: KLOR-CON Take 10 mEq by mouth daily.   predniSONE 10 MG tablet Commonly known as: DELTASONE 6 tablets all at once on Day 1,  Then taper by 1 tablet daily until gone   triamcinolone cream 0.1 % Commonly known as: KENALOG Apply 1 application topically 2 (two) times daily. To vulva until itching resolves,  Then reduce use to twice weekly       Allergies:  Allergies  Allergen Reactions  . Baycol [Cerivastatin Sodium]   . Cardizem [Diltiazem Hcl]     LOWER EXTREMITY EDEMA  . Crestor [Rosuvastatin Calcium] Other (See Comments)    INTOLERANT  . Dronedarone Other (See Comments)    Fatigue  . Metoprolol Tartrate Other (See Comments)  . Omeprazole Other (See Comments)  . Pravachol     INTOLERANT  . Statins Other (See Comments)  . Vioxx [Rofecoxib]   . Zocor [Simvastatin]     INTOLERANT      Family History: Family History  Problem Relation Age of Onset  . Coronary artery disease Father   . Kidney disease Father   . Cancer Sister   . Diabetes Other   . Cancer Mother        COLON  . Bladder Cancer Neg Hx   . Breast cancer Neg Hx     Social History:  reports that she has quit smoking. She has never used smokeless tobacco. She reports current alcohol use. She reports that she does not use  drugs.  ROS: UROLOGY Frequent Urination?: No Hard to postpone urination?: Yes Burning/pain with urination?: No Get up at night to urinate?: Yes Leakage of urine?: No Urine stream starts and stops?: No Trouble starting stream?: No Do you have to strain to urinate?: No Blood in urine?: No Urinary tract infection?: Yes Sexually transmitted disease?: No Injury to kidneys or bladder?: No Painful intercourse?: No Weak stream?: No Currently pregnant?: No Vaginal bleeding?: No Last menstrual period?: N/A  Gastrointestinal Nausea?: No Vomiting?: No Indigestion/heartburn?: No Diarrhea?: Yes Constipation?: No  Constitutional Fever: No Night sweats?: No Weight loss?: No Fatigue?: Yes  Skin Skin rash/lesions?: No Itching?: No  Eyes Blurred vision?: No Double vision?: No  Ears/Nose/Throat Sore throat?: No Sinus problems?: No  Hematologic/Lymphatic Swollen glands?: No Easy bruising?: No  Cardiovascular Leg swelling?: Yes Chest pain?: No  Respiratory Cough?: No Shortness of breath?: No  Endocrine Excessive thirst?: No  Musculoskeletal Back pain?: No Joint pain?: No  Neurological Headaches?: No Dizziness?: No  Psychologic Depression?: No Anxiety?: No  Physical Exam: BP (!) 148/65 (BP Location: Left Arm, Patient Position: Sitting, Cuff Size: Normal)   Pulse (!) 101   Ht 5\' 2"  (1.575 m)   Wt 132 lb (59.9 kg)   BMI 24.14 kg/m   Constitutional:  Alert and oriented, No acute distress. HEENT: [place] AT, moist mucus membranes.  Trachea midline, no  masses. Cardiovascular: No clubbing, cyanosis, or edema. Respiratory: Normal respiratory effort, no increased work of breathing. Neurologic: Grossly intact, no focal deficits, moving all 4 extremities. Psychiatric: Normal mood and affect.  Laboratory Data:  Urinalysis Microscopic negative WBC/RBC  Assessment & Plan:    - Recurrent UTI Her only complaint is external vaginal itching.  Urinalysis is clear today.  I recommended she follow-up with gynecology or her PCP regarding her vaginal itching.  I did recommend scheduling a screening renal ultrasound for her recurrent UTI and she will be notified with results.   Return if symptoms worsen or fail to improve, for Will call with u/s results .  Abbie Sons, [place] [address], [place], [place] [address] [phone]

## 2019-06-01 DIAGNOSIS — E876 Hypokalemia: Secondary | ICD-10-CM | POA: Diagnosis not present

## 2019-06-09 ENCOUNTER — Emergency Department: Payer: Medicare Other

## 2019-06-09 ENCOUNTER — Other Ambulatory Visit: Payer: Self-pay

## 2019-06-09 ENCOUNTER — Inpatient Hospital Stay
Admission: EM | Admit: 2019-06-09 | Discharge: 2019-06-14 | DRG: 482 | Disposition: A | Discharge status: Skilled Nursing Facility | Payer: Medicare Other | Attending: Internal Medicine | Admitting: Internal Medicine

## 2019-06-09 DIAGNOSIS — Z7901 Long term (current) use of anticoagulants: Secondary | ICD-10-CM

## 2019-06-09 DIAGNOSIS — Z9861 Coronary angioplasty status: Secondary | ICD-10-CM | POA: Diagnosis not present

## 2019-06-09 DIAGNOSIS — Z20822 Contact with and (suspected) exposure to covid-19: Secondary | ICD-10-CM | POA: Diagnosis present

## 2019-06-09 DIAGNOSIS — I10 Essential (primary) hypertension: Secondary | ICD-10-CM | POA: Diagnosis present

## 2019-06-09 DIAGNOSIS — E785 Hyperlipidemia, unspecified: Secondary | ICD-10-CM | POA: Diagnosis not present

## 2019-06-09 DIAGNOSIS — Z841 Family history of disorders of kidney and ureter: Secondary | ICD-10-CM

## 2019-06-09 DIAGNOSIS — Z800 Family history of malignant neoplasm of digestive organs: Secondary | ICD-10-CM | POA: Diagnosis not present

## 2019-06-09 DIAGNOSIS — Y92009 Unspecified place in unspecified non-institutional (private) residence as the place of occurrence of the external cause: Secondary | ICD-10-CM

## 2019-06-09 DIAGNOSIS — Z833 Family history of diabetes mellitus: Secondary | ICD-10-CM | POA: Diagnosis not present

## 2019-06-09 DIAGNOSIS — R52 Pain, unspecified: Secondary | ICD-10-CM | POA: Diagnosis not present

## 2019-06-09 DIAGNOSIS — M80052D Age-related osteoporosis with current pathological fracture, left femur, subsequent encounter for fracture with routine healing: Secondary | ICD-10-CM | POA: Diagnosis not present

## 2019-06-09 DIAGNOSIS — X500XXA Overexertion from strenuous movement or load, initial encounter: Secondary | ICD-10-CM | POA: Diagnosis not present

## 2019-06-09 DIAGNOSIS — S299XXA Unspecified injury of thorax, initial encounter: Secondary | ICD-10-CM | POA: Diagnosis not present

## 2019-06-09 DIAGNOSIS — Z8249 Family history of ischemic heart disease and other diseases of the circulatory system: Secondary | ICD-10-CM

## 2019-06-09 DIAGNOSIS — Z9049 Acquired absence of other specified parts of digestive tract: Secondary | ICD-10-CM

## 2019-06-09 DIAGNOSIS — E119 Type 2 diabetes mellitus without complications: Secondary | ICD-10-CM | POA: Diagnosis not present

## 2019-06-09 DIAGNOSIS — Z79899 Other long term (current) drug therapy: Secondary | ICD-10-CM

## 2019-06-09 DIAGNOSIS — M2550 Pain in unspecified joint: Secondary | ICD-10-CM | POA: Diagnosis not present

## 2019-06-09 DIAGNOSIS — Z03818 Encounter for observation for suspected exposure to other biological agents ruled out: Secondary | ICD-10-CM | POA: Diagnosis not present

## 2019-06-09 DIAGNOSIS — S7292XA Unspecified fracture of left femur, initial encounter for closed fracture: Secondary | ICD-10-CM

## 2019-06-09 DIAGNOSIS — Z8542 Personal history of malignant neoplasm of other parts of uterus: Secondary | ICD-10-CM

## 2019-06-09 DIAGNOSIS — G4733 Obstructive sleep apnea (adult) (pediatric): Secondary | ICD-10-CM | POA: Diagnosis not present

## 2019-06-09 DIAGNOSIS — I2510 Atherosclerotic heart disease of native coronary artery without angina pectoris: Secondary | ICD-10-CM | POA: Diagnosis present

## 2019-06-09 DIAGNOSIS — I482 Chronic atrial fibrillation, unspecified: Secondary | ICD-10-CM

## 2019-06-09 DIAGNOSIS — W19XXXA Unspecified fall, initial encounter: Secondary | ICD-10-CM | POA: Diagnosis not present

## 2019-06-09 DIAGNOSIS — S72142A Displaced intertrochanteric fracture of left femur, initial encounter for closed fracture: Principal | ICD-10-CM | POA: Diagnosis present

## 2019-06-09 DIAGNOSIS — Z90710 Acquired absence of both cervix and uterus: Secondary | ICD-10-CM | POA: Diagnosis not present

## 2019-06-09 DIAGNOSIS — I2584 Coronary atherosclerosis due to calcified coronary lesion: Secondary | ICD-10-CM | POA: Diagnosis present

## 2019-06-09 DIAGNOSIS — M8580 Other specified disorders of bone density and structure, unspecified site: Secondary | ICD-10-CM | POA: Diagnosis present

## 2019-06-09 DIAGNOSIS — Z950 Presence of cardiac pacemaker: Secondary | ICD-10-CM

## 2019-06-09 DIAGNOSIS — T148XXA Other injury of unspecified body region, initial encounter: Secondary | ICD-10-CM

## 2019-06-09 DIAGNOSIS — Z7401 Bed confinement status: Secondary | ICD-10-CM | POA: Diagnosis not present

## 2019-06-09 DIAGNOSIS — R0902 Hypoxemia: Secondary | ICD-10-CM | POA: Diagnosis not present

## 2019-06-09 DIAGNOSIS — K219 Gastro-esophageal reflux disease without esophagitis: Secondary | ICD-10-CM | POA: Diagnosis not present

## 2019-06-09 DIAGNOSIS — I4821 Permanent atrial fibrillation: Secondary | ICD-10-CM | POA: Diagnosis present

## 2019-06-09 DIAGNOSIS — I4891 Unspecified atrial fibrillation: Secondary | ICD-10-CM | POA: Diagnosis present

## 2019-06-09 DIAGNOSIS — W010XXA Fall on same level from slipping, tripping and stumbling without subsequent striking against object, initial encounter: Secondary | ICD-10-CM | POA: Diagnosis not present

## 2019-06-09 DIAGNOSIS — S0990XA Unspecified injury of head, initial encounter: Secondary | ICD-10-CM

## 2019-06-09 DIAGNOSIS — R509 Fever, unspecified: Secondary | ICD-10-CM | POA: Diagnosis not present

## 2019-06-09 DIAGNOSIS — I959 Hypotension, unspecified: Secondary | ICD-10-CM | POA: Diagnosis not present

## 2019-06-09 DIAGNOSIS — I499 Cardiac arrhythmia, unspecified: Secondary | ICD-10-CM | POA: Diagnosis not present

## 2019-06-09 HISTORY — DX: Unspecified fracture of left femur, initial encounter for closed fracture: S72.92XA

## 2019-06-09 LAB — RESPIRATORY PANEL BY RT PCR (FLU A&B, COVID)
Influenza A by PCR: NEGATIVE
Influenza B by PCR: NEGATIVE
SARS Coronavirus 2 by RT PCR: NEGATIVE

## 2019-06-09 LAB — URINALYSIS, ROUTINE W REFLEX MICROSCOPIC
Bilirubin Urine: NEGATIVE
Glucose, UA: NEGATIVE mg/dL
Hgb urine dipstick: NEGATIVE
Ketones, ur: 5 mg/dL — AB
Leukocytes,Ua: NEGATIVE
Nitrite: NEGATIVE
Protein, ur: NEGATIVE mg/dL
Specific Gravity, Urine: 1.01 (ref 1.005–1.030)
pH: 6 (ref 5.0–8.0)

## 2019-06-09 LAB — CBC WITH DIFFERENTIAL/PLATELET
Abs Immature Granulocytes: 0.03 10*3/uL (ref 0.00–0.07)
Basophils Absolute: 0 10*3/uL (ref 0.0–0.1)
Basophils Relative: 0 %
Eosinophils Absolute: 0 10*3/uL (ref 0.0–0.5)
Eosinophils Relative: 0 %
HCT: 40.8 % (ref 36.0–46.0)
Hemoglobin: 13.5 g/dL (ref 12.0–15.0)
Immature Granulocytes: 0 %
Lymphocytes Relative: 17 %
Lymphs Abs: 1.8 10*3/uL (ref 0.7–4.0)
MCH: 31 pg (ref 26.0–34.0)
MCHC: 33.1 g/dL (ref 30.0–36.0)
MCV: 93.8 fL (ref 80.0–100.0)
Monocytes Absolute: 0.4 10*3/uL (ref 0.1–1.0)
Monocytes Relative: 3 %
Neutro Abs: 8.4 10*3/uL — ABNORMAL HIGH (ref 1.7–7.7)
Neutrophils Relative %: 80 %
Platelets: 167 10*3/uL (ref 150–400)
RBC: 4.35 MIL/uL (ref 3.87–5.11)
RDW: 12.6 % (ref 11.5–15.5)
WBC: 10.6 10*3/uL — ABNORMAL HIGH (ref 4.0–10.5)
nRBC: 0 % (ref 0.0–0.2)

## 2019-06-09 LAB — COMPREHENSIVE METABOLIC PANEL
ALT: 19 U/L (ref 0–44)
AST: 27 U/L (ref 15–41)
Albumin: 3.9 g/dL (ref 3.5–5.0)
Alkaline Phosphatase: 62 U/L (ref 38–126)
Anion gap: 8 (ref 5–15)
BUN: 20 mg/dL (ref 8–23)
CO2: 28 mmol/L (ref 22–32)
Calcium: 8.7 mg/dL — ABNORMAL LOW (ref 8.9–10.3)
Chloride: 102 mmol/L (ref 98–111)
Creatinine, Ser: 0.87 mg/dL (ref 0.44–1.00)
GFR calc Af Amer: >60 mL/min (ref >60)
GFR calc non Af Amer: 57 mL/min — ABNORMAL LOW (ref >60)
Glucose, Bld: 188 mg/dL — ABNORMAL HIGH (ref 70–99)
Potassium: 4.1 mmol/L (ref 3.5–5.1)
Sodium: 138 mmol/L (ref 135–145)
Total Bilirubin: 1.6 mg/dL — ABNORMAL HIGH (ref 0.3–1.2)
Total Protein: 7.2 g/dL (ref 6.5–8.1)

## 2019-06-09 LAB — TYPE AND SCREEN
ABO/RH(D): O POS
Antibody Screen: NEGATIVE

## 2019-06-09 LAB — PROTIME-INR
INR: 1.2 (ref 0.8–1.2)
Prothrombin Time: 14.9 seconds (ref 11.4–15.2)

## 2019-06-09 MED ORDER — FUROSEMIDE 20 MG PO TABS
20.0000 mg | ORAL_TABLET | Freq: Every day | ORAL | Status: DC
  Administered 2019-06-10 – 2019-06-11 (×2): 20 mg via ORAL
  Filled 2019-06-09 (×2): qty 1

## 2019-06-09 MED ORDER — FENTANYL CITRATE (PF) 100 MCG/2ML IJ SOLN: 50.0000 ug | INTRAMUSCULAR | Status: DC | PRN

## 2019-06-09 MED ORDER — ONDANSETRON HCL 4 MG/2ML IJ SOLN
4.0000 mg | Freq: Once | INTRAMUSCULAR | Status: AC
  Administered 2019-06-09: 4 mg via INTRAVENOUS
  Filled 2019-06-09: qty 2

## 2019-06-09 MED ORDER — CLINDAMYCIN PHOSPHATE 600 MG/50ML IV SOLN
600.0000 mg | INTRAVENOUS | Status: AC
  Filled 2019-06-09: qty 50

## 2019-06-09 MED ORDER — SODIUM CHLORIDE 0.9 % IV SOLN: INTRAVENOUS | Status: DC

## 2019-06-09 MED ORDER — CHLORHEXIDINE GLUCONATE 4 % EX LIQD
1.0000 "application " | Freq: Once | CUTANEOUS | Status: AC
  Administered 2019-06-10: 1 via TOPICAL

## 2019-06-09 MED ORDER — HYDROCODONE-ACETAMINOPHEN 5-325 MG PO TABS
1.0000 | ORAL_TABLET | ORAL | Status: DC | PRN
  Administered 2019-06-09 – 2019-06-14 (×7): 1 via ORAL
  Filled 2019-06-09 (×6): qty 1

## 2019-06-09 MED ORDER — ENOXAPARIN SODIUM 40 MG/0.4ML ~~LOC~~ SOLN
40.0000 mg | SUBCUTANEOUS | Status: DC
  Administered 2019-06-09: 40 mg via SUBCUTANEOUS
  Filled 2019-06-09: qty 0.4

## 2019-06-09 MED ORDER — POTASSIUM CHLORIDE CRYS ER 20 MEQ PO TBCR
10.0000 meq | EXTENDED_RELEASE_TABLET | Freq: Every day | ORAL | Status: DC
  Administered 2019-06-10 – 2019-06-11 (×2): 10 meq via ORAL
  Filled 2019-06-09 (×2): qty 1

## 2019-06-09 MED ORDER — CEFAZOLIN SODIUM-DEXTROSE 2-4 GM/100ML-% IV SOLN
2.0000 g | INTRAVENOUS | Status: AC
  Filled 2019-06-09: qty 100

## 2019-06-09 MED ORDER — MORPHINE SULFATE (PF) 2 MG/ML IV SOLN
1.0000 mg | INTRAVENOUS | Status: DC | PRN
  Administered 2019-06-09 – 2019-06-10 (×4): 1 mg via INTRAVENOUS
  Filled 2019-06-09 (×4): qty 1

## 2019-06-09 MED ORDER — SPIRONOLACTONE 25 MG PO TABS
25.0000 mg | ORAL_TABLET | Freq: Every day | ORAL | Status: DC
  Administered 2019-06-10 – 2019-06-11 (×2): 25 mg via ORAL
  Filled 2019-06-09 (×3): qty 1

## 2019-06-09 NOTE — ED Provider Notes (Signed)
[facility] Emergency Department Provider Note    First MD Initiated Contact with Patient 06/09/19 [phone]     (approximate)  I have reviewed the triage vital signs and the nursing notes.   HISTORY  Chief Complaint Fall and Hip Pain    HPI LATINA VERA is a 86 y.o. female the below listed past medical history presents to the ER for evaluation of acute left hip pain that occurred after she had a mechanical fall this morning.  States that she was moving a chair and lost her balance falling hitting her head on and are not without LOC.  Landed on her left side.  Was unable to ambulate due to pain.  EMS found the patient was shortening and external rotation of the left hip.  Denies any previous surgeries.  She is on Eliquis for history of A. fib.    Past Medical History:  Diagnosis Date  . Allergy   . Atrial fibrillation ([place])   . CAD (coronary artery disease)   . Cancer (HCC)    UTERINE  . Chronic anticoagulation   . Chronic diarrhea   . Decubitus ulcer    sacral region  . Diabetes ([place])    diet controlled  . Dysuria   . Edema of lower extremity    mainly right foot, slightly in left foot.  . Fibrocystic breast disease   . GERD (gastroesophageal reflux disease)   . Glaucoma   . Glaucoma   . Hematuria   . Hemorrhoids   . History of colon polyps   . History of pancreatitis   . Hyperlipidemia   . Hypertension   . Hypokalemia   . IBS (irritable bowel syndrome)   . Microscopic hematuria   . Mitral valve regurgitation   . Osteoarthritis    fingers  . Pernicious anemia   . Plantar fasciitis   . Recurrent UTI   . Skin cancer   . Sleep apnea, obstructive   . Vaginal atrophy   . Vitamin D deficiency   . Yeast vaginitis    Family History  Problem Relation Age of Onset  . Coronary artery disease Father   . Kidney disease Father   . Cancer Sister   . Diabetes Other   . Cancer Mother        COLON  . Bladder Cancer Neg Hx   . Breast cancer  Neg Hx    Past Surgical History:  Procedure Laterality Date  . ABDOMINAL HYSTERECTOMY  1980's  . ABDOMINAL SURGERY     for villous polyp,,,many years ago  . APPENDECTOMY  1940's  . ASCAD, s/p PTCA  11/28/2005   MID LESION   . BREAST BIOPSY Left 1970's  . CARPAL TUNNEL RELEASE Right 12/13/2015   Procedure: CARPAL TUNNEL RELEASE;  Surgeon: Thornton Park, MD;  Location: [facility] ORS;  Service: Orthopedics;  Laterality: Right;  . COLECTOMY  2015  . PACEMAKER PLACEMENT    . radation     for uterine cance  . REFRACTIVE SURGERY     for bilateral glaumoma  . TONSILLECTOMY  1936   Patient Active Problem List   Diagnosis Date Noted  . Vaginal itching 03/25/2019  . Sciatica of right side 02/18/2019  . Vaginitis and vulvovaginitis 12/26/2018  . Educated about COVID-19 virus infection 10/12/2018  . Edema of lower extremity 11/24/2017  . Exertional dyspnea 06/16/2017  . Venous insufficiency of both lower extremities 11/24/2016  . Essential hypertension 11/11/2016  . Peripheral artery disease ([place]) 11/11/2016  .  Chronic pulmonary hypertension ([place]) 10/20/2016  . Leg swelling 06/17/2016  . Type 2 diabetes mellitus with diabetic neuropathy, unspecified ([place]) 06/17/2016  . Urinary incontinence, urge 12/27/2015  . Atrophic vaginitis 09/26/2015  . Severe tricuspid valve insufficiency 07/04/2015  . Umbilical hernia without obstruction and without gangrene 06/26/2015  . Carpal tunnel syndrome of right wrist 05/29/2015  . Chronic fatigue 05/01/2015  . Left leg pain 05/01/2015  . Skin lesion 01/29/2015  . Vaginal atrophy 11/21/2014  . Urethral caruncle 11/21/2014  . Mitral valve regurgitation 05/09/2014  . History of pancreatitis 03/09/2014  . Fatigue 03/09/2014  . Other malaise and fatigue 03/09/2014  . Hypokalemia 04/12/2013  . Encounter for current long-term use of anticoagulants 10/06/2012  . Hemorrhoids 06/08/2012  . Osteoarthritis 03/08/2012  . Screening for breast cancer 03/08/2012    . Pernicious anemia 02/04/2012  . History of colectomy 02/04/2012  . Vitamin D deficiency 07/30/2011  . Anticoagulant long-term use 06/13/2011  . Atrial fibrillation ([place]) 06/13/2011  . CAD (coronary artery disease) 06/13/2011  . Chronic diarrhea 06/13/2011      Prior to Admission medications   Medication Sig Start Date End Date Taking? Authorizing Provider  apixaban (ELIQUIS) 2.5 MG TABS tablet Take 2.5 mg by mouth 2 (two) times daily. 12/29/18   [provider]  cholecalciferol (VITAMIN D) 1000 UNITS tablet Take 1,000 Units by mouth daily.    [provider]  clobetasol cream (TEMOVATE) [id number] % Apply 1 application topically 2 (two) times daily. 12/24/18   Crecencio Mc, MD  Cranberry 200 MG CAPS Take 1 capsule (200 mg total) by mouth daily. 01/06/19   Jodelle Green, FNP  cyanocobalamin (,VITAMIN B-12,) 1000 MCG/ML injection INJECT 1ML INTO THE MUSCLE EVERY 14 DAYS 03/30/19   Crecencio Mc, MD  diltiazem (CARDIZEM CD) 180 MG 24 hr capsule TAKE 1 CAPSULE BY MOUTH EVERY MORNING 12/23/18   Crecencio Mc, MD  diltiazem [facility]) 180 MG 24 hr capsule Take by mouth. 06/15/17   [provider]  diphenoxylate-atropine (LOMOTIL) 2.5-0.025 MG tablet TAKE ONE TABLET BY MOUTH TWICE A DAY AS NEEDED FOR DIARRHEA 04/06/19   Crecencio Mc, MD  estradiol (ESTRACE) 0.1 MG/GM vaginal cream Place 2 g vaginally at bedtime. For 2 weeks,  Then reduce dose to 1 gram every 3-4 days 12/15/18   Crecencio Mc, MD  furosemide (LASIX) 20 MG tablet Take 20 mg by mouth daily. 03/21/19   [provider]  hydrocortisone-pramoxine (ANALPRAM-HC) 2.5-1 % rectal cream APPLY RECTALLY THREE TIMES DAILY Patient taking differently: Place 1 application rectally as needed.  09/15/14   Jackolyn Confer, MD  Hypromellose (GENTEAL) 0.3 % SOLN Place 1 drop into both eyes daily as needed (dryness).     [provider]  ketoconazole (NIZORAL) 2 % cream Apply 1 application topically daily as  needed.  10/09/16   [provider]  metoprolol succinate (TOPROL-XL) 25 MG 24 hr tablet Take 1 tablet (25 mg total) by mouth at bedtime. 09/14/18   Crecencio Mc, MD  nystatin-triamcinolone (MYCOLOG II) cream Apply 1 application topically 2 (two) times daily. 01/21/19   Lawhorn, Lara Mulch, CNM  potassium chloride (KLOR-CON) 10 MEQ tablet Take 10 mEq by mouth daily. 05/21/19   [provider]  Pramoxine-HC (HYDROCORTISONE ACE-PRAMOXINE) 2.5-1 % CREA Place rectally. 09/01/13   [provider]  predniSONE (DELTASONE) 10 MG tablet 6 tablets all at once on Day 1,  Then taper by 1 tablet daily until gone 02/18/19  Crecencio Mc, MD  triamcinolone cream (KENALOG) 0.1 % Apply 1 application topically 2 (two) times daily. To vulva until itching resolves,  Then reduce use to twice weekly 03/25/19   Crecencio Mc, MD    Allergies Baycol [cerivastatin sodium], Cardizem [diltiazem hcl], Crestor [rosuvastatin calcium], Dronedarone, Metoprolol tartrate, Omeprazole, Pravachol, Statins, Vioxx [rofecoxib], and Zocor [simvastatin]    Social History Social History   Tobacco Use  . Smoking status: Former Research scientist (life sciences)  . Smokeless tobacco: Never Used  . Tobacco comment: quit 45 years ago  Substance Use Topics  . Alcohol use: Yes    Alcohol/week: 0.0 standard drinks    Comment: Rarely, 1-2 times a year  . Drug use: No    Review of Systems Patient denies headaches, rhinorrhea, blurry vision, numbness, shortness of breath, chest pain, edema, cough, abdominal pain, nausea, vomiting, diarrhea, dysuria, fevers, rashes or hallucinations unless otherwise stated above in HPI. ____________________________________________   PHYSICAL EXAM:  VITAL SIGNS: Vitals:   06/09/19 0935  BP: (!) 157/66  Pulse: 70  Resp: 18  SpO2: 95%    Constitutional: Alert and oriented.  Eyes: Conjunctivae are normal.  Head: Atraumatic. Nose: No congestion/rhinnorhea. Mouth/Throat: Mucous membranes  are moist.   Neck: No stridor. Painless ROM.  Cardiovascular: Normal rate, regular rhythm. Grossly normal heart sounds.  Good peripheral circulation. Respiratory: Normal respiratory effort.  No retractions. Lungs CTAB. Gastrointestinal: Soft and nontender. No distention. No abdominal bruits. No CVA tenderness. Genitourinary:  Musculoskeletal: left leg shortened and externally rotated  No joint effusions. Neurologic:  Normal speech and language. No gross focal neurologic deficits are appreciated. No facial droop Skin:  Skin is warm, dry and intact. No rash noted. Psychiatric: Mood and affect are normal. Speech and behavior are normal.  ____________________________________________   LABS (all labs ordered are listed, but only abnormal results are displayed)  No results found for this or any previous visit (from the past 24 hour(s)). ____________________________________________  EKG My review and personal interpretation at Time:   11:16 Indication: fall  Rate: 80  Rhythm: afib Axis: normal Other: nonspecific st abn, abnml ekg ____________________________________________  RADIOLOGY  I personally reviewed all radiographic images ordered to evaluate for the above acute complaints and reviewed radiology reports and findings.  These findings were personally discussed with the patient.  Please see medical record for radiology report.  ____________________________________________   PROCEDURES  Procedure(s) performed:  Procedures    Critical Care performed: no ____________________________________________   INITIAL IMPRESSION / ASSESSMENT AND PLAN / ED COURSE  Pertinent labs & imaging results that were available during my care of the patient were reviewed by me and considered in my medical decision making (see chart for details).   DDX: fracture, dislocation, contusion, electrolyte abn, sdh, sah  Kara [name] is a 86 y.o. who presents to the ED with symptoms as described above.   Sounds like is purely mechanical fall.  She is on Eliquis.  X-ray does show evidence of left intertrochanteric fracture with displacement.  CT imaging of the head fortunately she has no evidence of acute intracranial abnormality.  No neck pain on exam.  Will require admission to the hospital.  Clinical Course as of Jun 09 1151  Thu Jun 09, 2019  1056 Case discussed with Ortho, Dr. Sabra Heck.  Plan operative management tomorrow.   [PR]    Clinical Course User Index [PR] Merlyn Lot, MD    The patient was evaluated in Emergency Department today for the symptoms described in the history of  present illness. He/she was evaluated in the context of the global COVID-19 pandemic, which necessitated consideration that the patient might be at risk for infection with the SARS-CoV-2 virus that causes COVID-19. Institutional protocols and algorithms that pertain to the evaluation of patients at risk for COVID-19 are in a state of rapid change based on information released by regulatory bodies including the CDC and federal and state organizations. These policies and algorithms were followed during the patient's care in the ED.  As part of my medical decision making, I reviewed the following data within the [place] notes reviewed and incorporated, Labs reviewed, notes from prior ED visits and [place] Controlled Substance Database   ____________________________________________   FINAL CLINICAL IMPRESSION(S) / ED DIAGNOSES  Final diagnoses:  Closed displaced intertrochanteric fracture of left femur, initial encounter ([place])  Injury of head, initial encounter      NEW MEDICATIONS STARTED DURING THIS VISIT:  New Prescriptions   No medications on file     Note:  This document was prepared using Dragon voice recognition software and may include unintentional dictation errors.    Merlyn Lot, MD 06/09/19 1153

## 2019-06-09 NOTE — ED Triage Notes (Signed)
Pt arrives via EMS from home after she tripped over her chair and fell backwards- pt hit her head but states no pain- pt having left hip pain and was unable to bear weight on it- per EMS no crepitous noted- EMS placed her in a hip splint with a sheet- deformity noted

## 2019-06-09 NOTE — ED Notes (Signed)
Pt moved to hospital bed.

## 2019-06-09 NOTE — ED Notes (Addendum)
Lab called and stated that pt type and screen had hemolysed- asked lab to redraw d/t difficult stick

## 2019-06-09 NOTE — ED Notes (Signed)
Pt taken for CT 

## 2019-06-09 NOTE — Consult Note (Signed)
ORTHOPAEDIC CONSULTATION  REQUESTING PHYSICIAN: Merlyn Lot, MD  Chief Complaint: Left hip pain  HPI: Kara [name] is a 86 y.o. female who complains of left hip pain after falling at home today.  She was moving a chair and lost her balance and fell.  She was brought to the emergency room where exam and x-rays revealed a displaced comminuted left intertrochanteric hip fracture.  She is being admitted to the hospitalist service for care.  Patient takes Eliquis and took her last dosage last night.  The operating room indicates that there will be no room to do surgery on Friday, 06/10/2019 until late at night.  Therefore we will wait until Saturday morning to do the surgery.  Risks and benefits and postop protocol were discussed with the patient.  I will contact her family to discuss this as well.  She does have a history of atrial fibrillation and coronary artery disease.  She had uterine cancer in the past.  She has a pacemaker and has had hysterectomy and breast biopsy.  Past Medical History:  Diagnosis Date  . Allergy   . Atrial fibrillation ([place])   . CAD (coronary artery disease)   . Cancer (HCC)    UTERINE  . Chronic anticoagulation   . Chronic diarrhea   . Decubitus ulcer    sacral region  . Diabetes ([place])    diet controlled  . Dysuria   . Edema of lower extremity    mainly right foot, slightly in left foot.  . Fibrocystic breast disease   . GERD (gastroesophageal reflux disease)   . Glaucoma   . Glaucoma   . Hematuria   . Hemorrhoids   . History of colon polyps   . History of pancreatitis   . Hyperlipidemia   . Hypertension   . Hypokalemia   . IBS (irritable bowel syndrome)   . Microscopic hematuria   . Mitral valve regurgitation   . Osteoarthritis    fingers  . Pernicious anemia   . Plantar fasciitis   . Recurrent UTI   . Skin cancer   . Sleep apnea, obstructive   . Vaginal atrophy   . Vitamin D deficiency   . Yeast vaginitis    Past Surgical History:   Procedure Laterality Date  . ABDOMINAL HYSTERECTOMY  1980's  . ABDOMINAL SURGERY     for villous polyp,,,many years ago  . APPENDECTOMY  1940's  . ASCAD, s/p PTCA  11/28/2005   MID LESION   . BREAST BIOPSY Left 1970's  . CARPAL TUNNEL RELEASE Right 12/13/2015   Procedure: CARPAL TUNNEL RELEASE;  Surgeon: Thornton Park, MD;  Location: [facility] ORS;  Service: Orthopedics;  Laterality: Right;  . COLECTOMY  2015  . PACEMAKER PLACEMENT    . radation     for uterine cance  . REFRACTIVE SURGERY     for bilateral glaumoma  . TONSILLECTOMY  1936   Social History   Socioeconomic History  . Marital status: Widowed    Spouse name: Not on file  . Number of children: 3  . Years of education: Not on file  . Highest education level: Not on file  Occupational History  . Occupation: Retired Restaurant manager, fast food - primary  Tobacco Use  . Smoking status: Former Research scientist (life sciences)  . Smokeless tobacco: Never Used  . Tobacco comment: quit 45 years ago  Substance and Sexual Activity  . Alcohol use: Yes    Alcohol/week: 0.0 standard drinks    Comment: Rarely, 1-2 times a  year  . Drug use: No  . Sexual activity: Not Currently    Birth control/protection: Surgical  Other Topics Concern  . Not on file  Social History Narrative   Lives at [place]. Born in [place]. Travels frequently.      1 daughter, 2 step children      Regular Exercise -  NO   Daily Caffeine Use:  1 coffee            Social Determinants of Health   Financial Resource Strain:   . Difficulty of Paying Living Expenses: Not on file  Food Insecurity:   . Worried About Charity fundraiser in the Last Year: Not on file  . Ran Out of Food in the Last Year: Not on file  Transportation Needs:   . Lack of Transportation (Medical): Not on file  . Lack of Transportation (Non-Medical): Not on file  Physical Activity:   . Days of Exercise per Week: Not on file  . Minutes of Exercise per Session: Not on file  Stress: No Stress  Concern Present  . Feeling of Stress : Not at all  Social Connections:   . Frequency of Communication with Friends and Family: Not on file  . Frequency of Social Gatherings with Friends and Family: Not on file  . Attends Religious Services: Not on file  . Active Member of Clubs or Organizations: Not on file  . Attends Archivist Meetings: Not on file  . Marital Status: Not on file   Family History  Problem Relation Age of Onset  . Coronary artery disease Father   . Kidney disease Father   . Cancer Sister   . Diabetes Other   . Cancer Mother        COLON  . Bladder Cancer Neg Hx   . Breast cancer Neg Hx    Allergies  Allergen Reactions  . Baycol [Cerivastatin Sodium]   . Cardizem [Diltiazem Hcl]     LOWER EXTREMITY EDEMA  . Crestor [Rosuvastatin Calcium] Other (See Comments)    INTOLERANT  . Dronedarone Other (See Comments)    Fatigue  . Metoprolol Tartrate Other (See Comments)  . Omeprazole Other (See Comments)  . Pravachol     INTOLERANT  . Statins Other (See Comments)  . Vioxx [Rofecoxib]   . Zocor [Simvastatin]     INTOLERANT   Prior to Admission medications   Medication Sig Start Date End Date Taking? Authorizing Provider  apixaban (ELIQUIS) 2.5 MG TABS tablet Take 2.5 mg by mouth 2 (two) times daily. 12/29/18  Yes [provider]  cyanocobalamin (,VITAMIN B-12,) 1000 MCG/ML injection INJECT 1ML INTO THE MUSCLE EVERY 14 DAYS 03/30/19  Yes Crecencio Mc, MD  diltiazem (CARDIZEM CD) 180 MG 24 hr capsule TAKE 1 CAPSULE BY MOUTH EVERY MORNING 12/23/18  Yes Crecencio Mc, MD  diphenoxylate-atropine (LOMOTIL) 2.5-0.025 MG tablet TAKE ONE TABLET BY MOUTH TWICE A DAY AS NEEDED FOR DIARRHEA 04/06/19  Yes Crecencio Mc, MD  metoprolol succinate (TOPROL-XL) 25 MG 24 hr tablet Take 1 tablet (25 mg total) by mouth at bedtime. 09/14/18  Yes Crecencio Mc, MD  potassium chloride (KLOR-CON) 10 MEQ tablet Take 10 mEq by mouth daily. 05/21/19  Yes [provider]  spironolactone (ALDACTONE) 25 MG tablet Take 25 mg by mouth daily.   Yes [provider]  triamcinolone cream (KENALOG) 0.1 % Apply 1 application topically 2 (two) times daily. To vulva until itching resolves,  Then reduce use to twice weekly Patient taking differently: Apply 1 application topically 2 (two) times a week.  03/25/19  Yes Crecencio Mc, MD  cholecalciferol (VITAMIN D) 1000 UNITS tablet Take 1,000 Units by mouth daily.    [provider]  Cranberry 200 MG CAPS Take 1 capsule (200 mg total) by mouth daily. 01/06/19   Jodelle Green, FNP  furosemide (LASIX) 20 MG tablet Take 20 mg by mouth daily. 03/21/19   [provider]   DG Chest 1 View  Result Date: 06/09/2019 CLINICAL DATA:  [place]. Acute left hip pain. EXAM: CHEST  1 VIEW COMPARISON:  Chest x-ray 01/14/2012 FINDINGS: Right atrial and right ventricular pacer wires are in good position. No complicating features. The heart is normal in size for age. Mild tortuosity and calcification of the thoracic aorta is noted. The lungs are clear. The bony structures appear intact. Probable remote healed left humeral neck fracture. IMPRESSION: No acute cardiopulmonary findings. Electronically Signed   By: Marijo Sanes M.D.   On: 06/09/2019 10:56   CT Head Wo Contrast  Result Date: 06/09/2019 CLINICAL DATA:  Fall, hit head EXAM: CT HEAD WITHOUT CONTRAST TECHNIQUE: Contiguous axial images were obtained from the base of the skull through the vertex without intravenous contrast. COMPARISON:  2016 FINDINGS: Brain: There is no acute intracranial hemorrhage, mass-effect, or edema. Gray-white differentiation is preserved. There is no extra-axial fluid collection. Ventricles and sulci are within normal limits in size and configuration. Patchy hypoattenuation in the supratentorial white matter is nonspecific but may reflect mild to moderate chronic microvascular ischemic changes. Vascular: There is atherosclerotic  calcification at the skull base. Skull: Calvarium is unremarkable. Sinuses/Orbits: No acute finding. Other: None. IMPRESSION: No evidence of acute intracranial injury. Electronically Signed   By: Macy Mis M.D.   On: 06/09/2019 10:41   DG Hip Unilat W or Wo Pelvis 2-3 Views Left  Result Date: 06/09/2019 CLINICAL DATA:  Fall, acute left hip pain EXAM: DG HIP (WITH OR WITHOUT PELVIS) 2-3V LEFT COMPARISON:  CT abdomen and pelvis from 2013 FINDINGS: Comminuted intertrochanteric fracture of the proximal left femur with marked Verus angulation and impaction with superior migration of the distal portion of the femur relative to femoral head and neck. Osteopenia. Signs of injection granulomata over the iliac crest on the right. IMPRESSION: Comminuted, impacted intertrochanteric fracture of the proximal left femur with varus deformity. Electronically Signed   By: Zetta Bills M.D.   On: 06/09/2019 10:55    Positive ROS: All other systems have been reviewed and were otherwise negative with the exception of those mentioned in the HPI and as above.  Physical Exam: General: Alert, no acute distress Cardiovascular: No pedal edema Respiratory: No cyanosis, no use of accessory musculature GI: No organomegaly, abdomen is soft and non-tender Skin: No lesions in the area of chief complaint Neurologic: Sensation intact distally Psychiatric: Patient is competent for consent with normal mood and affect Lymphatic: No axillary or cervical lymphadenopathy  MUSCULOSKELETAL: Patient is alert and cooperative sitting up in hospital bed in the emergency room area.  She is fully oriented.  The left leg is shortened and externally rotated.  The skin is intact.  Neurovascular status is good distally and she moves the ankle well.  The right leg is normal to exam.  The upper extremities and spine are normal.  Assessment: Comminuted intertrochanteric fracture left hip  Plan: Trochanteric fixation nailing of the left  hip. This is planned for Saturday morning due to Eliquis  and lack of operating room time available tomorrow.    Park Breed, MD [phone]   06/09/2019 5:51 PM

## 2019-06-09 NOTE — H&P (Signed)
History and Physical    Kara [name] [id number] DOB: 06-25-1922 DOA: 06/09/2019   PCP: Crecencio Mc, MD   Patient coming from: [facility].  Chief Complaint: Fall  HPI: Kara [name] is a 86 y.o. female with medical history significant of heart disease seen in ed for mechanical fall while moving furniture pt is to stop her eliquis and go to or on Saturday. Pt denies any other pain or concern. States that she was moving her chair because her cleaners were coming and she fell. She denies any dizziness/ chest pain palpitation. She lost her balance and hit her head and landed on her left side and came to er in ems for pain and not able to ambulate.  ED Course:  In ed pt is A/A/O and vitals are stable. She had an xray which showed fracture pf left femur. Orthopedic consulted and pt is scheduled for surgery on Saturday.   Review of Systems: As per HPI otherwise 10 point review of systems negative.   Past Medical History:  Diagnosis Date  . Allergy   . Atrial fibrillation ([place])   . CAD (coronary artery disease)   . Cancer (HCC)    UTERINE  . Chronic anticoagulation   . Chronic diarrhea   . Decubitus ulcer    sacral region  . Diabetes ([place])    diet controlled  . Dysuria   . Edema of lower extremity    mainly right foot, slightly in left foot.  . Fibrocystic breast disease   . GERD (gastroesophageal reflux disease)   . Glaucoma   . Glaucoma   . Hematuria   . Hemorrhoids   . History of colon polyps   . History of pancreatitis   . Hyperlipidemia   . Hypertension   . Hypokalemia   . IBS (irritable bowel syndrome)   . Microscopic hematuria   . Mitral valve regurgitation   . Osteoarthritis    fingers  . Pernicious anemia   . Plantar fasciitis   . Recurrent UTI   . Skin cancer   . Sleep apnea, obstructive   . Vaginal atrophy   . Vitamin D deficiency   . Yeast vaginitis     Past Surgical History:  Procedure Laterality Date  . ABDOMINAL HYSTERECTOMY  1980's   . ABDOMINAL SURGERY     for villous polyp,,,many years ago  . APPENDECTOMY  1940's  . ASCAD, s/p PTCA  11/28/2005   MID LESION   . BREAST BIOPSY Left 1970's  . CARPAL TUNNEL RELEASE Right 12/13/2015   Procedure: CARPAL TUNNEL RELEASE;  Surgeon: Thornton Park, MD;  Location: [facility] ORS;  Service: Orthopedics;  Laterality: Right;  . COLECTOMY  2015  . PACEMAKER PLACEMENT    . radation     for uterine cance  . REFRACTIVE SURGERY     for bilateral glaumoma  . TONSILLECTOMY  1936     reports that she has quit smoking. She has never used smokeless tobacco. She reports current alcohol use. She reports that she does not use drugs.  Allergies  Allergen Reactions  . Baycol [Cerivastatin Sodium]   . Cardizem [Diltiazem Hcl]     LOWER EXTREMITY EDEMA  . Crestor [Rosuvastatin Calcium] Other (See Comments)    INTOLERANT  . Dronedarone Other (See Comments)    Fatigue  . Metoprolol Tartrate Other (See Comments)  . Omeprazole Other (See Comments)  . Pravachol     INTOLERANT  . Statins Other (See Comments)  . Vioxx [Rofecoxib]   .  Zocor [Simvastatin]     INTOLERANT    Family History  Problem Relation Age of Onset  . Coronary artery disease Father   . Kidney disease Father   . Cancer Sister   . Diabetes Other   . Cancer Mother        COLON  . Bladder Cancer Neg Hx   . Breast cancer Neg Hx      Prior to Admission medications   Medication Sig Start Date End Date Taking? Authorizing Provider  apixaban (ELIQUIS) 2.5 MG TABS tablet Take 2.5 mg by mouth 2 (two) times daily. 12/29/18  Yes [provider]  cyanocobalamin (,VITAMIN B-12,) 1000 MCG/ML injection INJECT 1ML INTO THE MUSCLE EVERY 14 DAYS 03/30/19  Yes Crecencio Mc, MD  diltiazem (CARDIZEM CD) 180 MG 24 hr capsule TAKE 1 CAPSULE BY MOUTH EVERY MORNING 12/23/18  Yes Crecencio Mc, MD  diphenoxylate-atropine (LOMOTIL) 2.5-0.025 MG tablet TAKE ONE TABLET BY MOUTH TWICE A DAY AS NEEDED FOR DIARRHEA 04/06/19  Yes Crecencio Mc, MD  metoprolol succinate (TOPROL-XL) 25 MG 24 hr tablet Take 1 tablet (25 mg total) by mouth at bedtime. 09/14/18  Yes Crecencio Mc, MD  potassium chloride (KLOR-CON) 10 MEQ tablet Take 10 mEq by mouth daily. 05/21/19  Yes [provider]  spironolactone (ALDACTONE) 25 MG tablet Take 25 mg by mouth daily.   Yes [provider]  triamcinolone cream (KENALOG) 0.1 % Apply 1 application topically 2 (two) times daily. To vulva until itching resolves,  Then reduce use to twice weekly Patient taking differently: Apply 1 application topically 2 (two) times a week.  03/25/19  Yes Crecencio Mc, MD  cholecalciferol (VITAMIN D) 1000 UNITS tablet Take 1,000 Units by mouth daily.    [provider]  Cranberry 200 MG CAPS Take 1 capsule (200 mg total) by mouth daily. 01/06/19   Jodelle Green, FNP  furosemide (LASIX) 20 MG tablet Take 20 mg by mouth daily. 03/21/19   [provider]    Physical Exam: Vitals:   06/09/19 0935 06/09/19 0936 06/09/19 1300 06/09/19 1315  BP: (!) 157/66  125/74   Pulse: 70   92  Resp: 18  18 19   SpO2: 95%   99%  Weight:  59 kg    Height:  5\' 3"  (1.6 m)      Constitutional: NAD, calm, comfortable Vitals:   06/09/19 0935 06/09/19 0936 06/09/19 1300 06/09/19 1315  BP: (!) 157/66  125/74   Pulse: 70   92  Resp: 18  18 19   SpO2: 95%   99%  Weight:  59 kg    Height:  5\' 3"  (1.6 m)     Eyes: PERRL, lids and conjunctivae normal ENMT: Mucous membranes are moist. Posterior pharynx clear of any exudate or lesions.Normal dentition.  Neck: normal, supple, no masses, no thyromegaly Respiratory: clear to auscultation bilaterally, no wheezing, no crackles. Normal respiratory effort. No accessory muscle use.  Cardiovascular: Irregular rate and rhythm, no murmurs / rubs / gallops. No extremity edema. 2+ pedal pulses. No carotid bruits.  Abdomen: no tenderness, no masses palpated. No hepatosplenomegaly. Bowel sounds positive.    Musculoskeletal: no clubbing / cyanosis. No joint deformity upper and lower extremities. Good ROM, no contractures. Normal muscle tone. Left leg is ext rotated. Skin: no rashes, lesions, ulcers. No induration Neurologic: CN 2-12 grossly intact. Sensation intact, DTR normal. Strength 5/5 in all 4.  Psychiatric: Normal judgment and insight. Alert and oriented x 3.  Normal mood.   Labs on Admission: I have personally reviewed following labs and imaging studies  CBC: Recent Labs  Lab 06/09/19 1115  WBC 10.6*  NEUTROABS 8.4*  HGB 13.5  HCT 40.8  MCV 93.8  PLT [id number]   Basic Metabolic Panel: Recent Labs  Lab 06/09/19 1115  NA 138  K 4.1  CL 102  CO2 28  GLUCOSE 188*  BUN 20  CREATININE 0.87  CALCIUM 8.7*   GFR: Estimated Creatinine Clearance: 32.7 mL/min (by C-G formula based on SCr of 0.87 mg/dL). Liver Function Tests: Recent Labs  Lab 06/09/19 1115  AST 27  ALT 19  ALKPHOS 62  BILITOT 1.6*  PROT 7.2  ALBUMIN 3.9   No results for input(s): LIPASE, AMYLASE in the last 168 hours. No results for input(s): AMMONIA in the last 168 hours. Coagulation Profile: Recent Labs  Lab 06/09/19 1115  INR 1.2   Cardiac Enzymes: No results for input(s): CKTOTAL, CKMB, CKMBINDEX, TROPONINI in the last 168 hours. BNP (last 3 results) No results for input(s): PROBNP in the last 8760 hours. HbA1C: No results for input(s): HGBA1C in the last 72 hours. CBG: No results for input(s): GLUCAP in the last 168 hours. Lipid Profile: No results for input(s): CHOL, HDL, LDLCALC, TRIG, CHOLHDL, LDLDIRECT in the last 72 hours. Thyroid Function Tests: No results for input(s): TSH, T4TOTAL, FREET4, T3FREE, THYROIDAB in the last 72 hours. Anemia Panel: No results for input(s): VITAMINB12, FOLATE, FERRITIN, TIBC, IRON, RETICCTPCT in the last 72 hours. Urine analysis:    Component Value Date/Time   COLORURINE YELLOW (A) 06/09/2019 1117   APPEARANCEUR CLEAR (A) 06/09/2019 1117   APPEARANCEUR  Clear 05/27/2019 1002   LABSPEC 1.010 06/09/2019 1117   LABSPEC 1.016 12/26/2011 1844   PHURINE 6.0 06/09/2019 1117   GLUCOSEU NEGATIVE 06/09/2019 1117   GLUCOSEU NEGATIVE 12/10/2018 1020   HGBUR NEGATIVE 06/09/2019 [place] 06/09/2019 1117   BILIRUBINUR Negative 05/27/2019 1002   BILIRUBINUR Negative 12/26/2011 1844   KETONESUR 5 (A) 06/09/2019 1117   PROTEINUR NEGATIVE 06/09/2019 1117   UROBILINOGEN 0.2 12/10/2018 1020   NITRITE NEGATIVE 06/09/2019 1117   LEUKOCYTESUR NEGATIVE 06/09/2019 1117   LEUKOCYTESUR 1+ 12/26/2011 1844     Radiological Exams on Admission: DG Chest 1 View  Result Date: 06/09/2019 CLINICAL DATA:  [place]. Acute left hip pain. EXAM: CHEST  1 VIEW COMPARISON:  Chest x-ray 01/14/2012 FINDINGS: Right atrial and right ventricular pacer wires are in good position. No complicating features. The heart is normal in size for age. Mild tortuosity and calcification of the thoracic aorta is noted. The lungs are clear. The bony structures appear intact. Probable remote healed left humeral neck fracture. IMPRESSION: No acute cardiopulmonary findings. Electronically Signed   By: Marijo Sanes M.D.   On: 06/09/2019 10:56   CT Head Wo Contrast  Result Date: 06/09/2019 CLINICAL DATA:  Fall, hit head EXAM: CT HEAD WITHOUT CONTRAST TECHNIQUE: Contiguous axial images were obtained from the base of the skull through the vertex without intravenous contrast. COMPARISON:  2016 FINDINGS: Brain: There is no acute intracranial hemorrhage, mass-effect, or edema. Gray-white differentiation is preserved. There is no extra-axial fluid collection. Ventricles and sulci are within normal limits in size and configuration. Patchy hypoattenuation in the supratentorial white matter is nonspecific but may reflect mild to moderate chronic microvascular ischemic changes. Vascular: There is atherosclerotic calcification at the skull base. Skull: Calvarium is unremarkable. Sinuses/Orbits: No  acute finding. Other: None. IMPRESSION: No evidence of acute intracranial  injury. Electronically Signed   By: Macy Mis M.D.   On: 06/09/2019 10:41   DG Hip Unilat W or Wo Pelvis 2-3 Views Left  Result Date: 06/09/2019 CLINICAL DATA:  Fall, acute left hip pain EXAM: DG HIP (WITH OR WITHOUT PELVIS) 2-3V LEFT COMPARISON:  CT abdomen and pelvis from 2013 FINDINGS: Comminuted intertrochanteric fracture of the proximal left femur with marked Verus angulation and impaction with superior migration of the distal portion of the femur relative to femoral head and neck. Osteopenia. Signs of injection granulomata over the iliac crest on the right. IMPRESSION: Comminuted, impacted intertrochanteric fracture of the proximal left femur with varus deformity. Electronically Signed   By: Zetta Bills M.D.   On: 06/09/2019 10:55    EKG: A.fib 80.  Assessment/Plan Principal Problem:   Femur fracture, left (HCC) Active Problems:   Atrial fibrillation (HCC)   CAD (coronary artery disease)   Essential hypertension  Fracture of left femur due to fall: Pt is scheduled to have sx on Saturday/ off eliquis until then.  dvt prophylaxis started.  A.fib: cont home regimen with Cardizem and metoprolol.  Cad: Pt to continue her aldactone/ metoprolol/  Htn: Lasix 20 mg po continued.   DVT prophylaxis: Lovenox  Code Status: Full code Family Communication: None at bedside Disposition Plan: Independent living. Consults called: Orthopedic. Admission status:  Inpatient.   Para Skeans MD [facility] If 7PM-7AM, please contact night-coverage [email or web address] Password [facility]  06/09/2019, 4:15 PM

## 2019-06-09 NOTE — ED Notes (Signed)
Traction initiated by Jonelle Sidle, RN from 1A

## 2019-06-10 LAB — SURGICAL PCR SCREEN
MRSA, PCR: NEGATIVE
Staphylococcus aureus: NEGATIVE

## 2019-06-10 MED ORDER — DILTIAZEM HCL ER COATED BEADS 180 MG PO CP24
180.0000 mg | ORAL_CAPSULE | Freq: Every morning | ORAL | Status: DC
  Administered 2019-06-12 – 2019-06-14 (×3): 180 mg via ORAL
  Filled 2019-06-10 (×4): qty 1

## 2019-06-10 MED ORDER — CLINDAMYCIN PHOSPHATE 600 MG/50ML IV SOLN
600.0000 mg | INTRAVENOUS | Status: AC
  Administered 2019-06-11: 600 mg via INTRAVENOUS
  Filled 2019-06-10: qty 50

## 2019-06-10 MED ORDER — METOPROLOL SUCCINATE ER 25 MG PO TB24
25.0000 mg | ORAL_TABLET | Freq: Every day | ORAL | Status: DC
  Administered 2019-06-10 – 2019-06-11 (×2): 25 mg via ORAL
  Filled 2019-06-10 (×2): qty 1

## 2019-06-10 MED ORDER — CEFAZOLIN SODIUM-DEXTROSE 2-4 GM/100ML-% IV SOLN
2.0000 g | INTRAVENOUS | Status: AC
  Administered 2019-06-11: 2 g via INTRAVENOUS
  Filled 2019-06-10: qty 100

## 2019-06-10 NOTE — Anesthesia Preprocedure Evaluation (Addendum)
Anesthesia Evaluation  Patient identified by MRN, date of birth, ID band Patient awake    Reviewed: Allergy & Precautions, H&P , NPO status , Patient's Chart, lab work & pertinent test results  Airway Mallampati: II  TM Distance: >3 FB Neck ROM: full    Dental  (+) Chipped   Pulmonary neg shortness of breath, sleep apnea , former smoker,           Cardiovascular hypertension, (-) angina+ CAD and + Peripheral Vascular Disease  + dysrhythmias Atrial Fibrillation + pacemaker      Neuro/Psych negative neurological ROS  negative psych ROS   GI/Hepatic Neg liver ROS, GERD  Controlled,  Endo/Other  diabetes  Renal/GU      Musculoskeletal   Abdominal   Peds  Hematology  (+) Blood dyscrasia, anemia ,   Anesthesia Other Findings Past Medical History: No date: Allergy No date: Atrial fibrillation (HCC) No date: CAD (coronary artery disease) No date: Cancer ([place])     Comment:  UTERINE No date: Chronic anticoagulation No date: Chronic diarrhea No date: Decubitus ulcer     Comment:  sacral region No date: Diabetes ([place])     Comment:  diet controlled No date: Dysuria No date: Edema of lower extremity     Comment:  mainly right foot, slightly in left foot. No date: Fibrocystic breast disease No date: GERD (gastroesophageal reflux disease) No date: Glaucoma No date: Glaucoma No date: Hematuria No date: Hemorrhoids No date: History of colon polyps No date: History of pancreatitis No date: Hyperlipidemia No date: Hypertension No date: Hypokalemia No date: IBS (irritable bowel syndrome) No date: Microscopic hematuria No date: Mitral valve regurgitation No date: Osteoarthritis     Comment:  fingers No date: Pernicious anemia No date: Plantar fasciitis No date: Recurrent UTI No date: Skin cancer No date: Sleep apnea, obstructive No date: Vaginal atrophy No date: Vitamin D deficiency No date: Yeast vaginitis    Reproductive/Obstetrics negative OB ROS                           Anesthesia Physical Anesthesia Plan  ASA: III  Anesthesia Plan: General ETT   Post-op Pain Management:    Induction:   PONV Risk Score and Plan: Ondansetron, Dexamethasone, Midazolam and Treatment may vary due to age or medical condition  Airway Management Planned:   Additional Equipment:   Intra-op Plan:   Post-operative Plan:   Informed Consent: I have reviewed the patients History and Physical, chart, labs and discussed the procedure including the risks, benefits and alternatives for the proposed anesthesia with the patient or authorized representative who has indicated his/her understanding and acceptance.     Dental Advisory Given  Plan Discussed with: Anesthesiologist  Anesthesia Plan Comments: (Not eligible for spinal given Eliquis administration within 72 hrs)        Anesthesia Quick Evaluation

## 2019-06-10 NOTE — H&P (Signed)
Progress Note   Kara [name] [id number] DOB: [birth year] DOA: 06/09/2019   PCP: Crecencio Mc, MD   Patient coming from: [facility].  Chief Complaint: Fall  HPI: Kara [name] is a 86 y.o. female with medical history significant of heart disease seen in ed for mechanical fall while moving furniture pt is to stop her eliquis and go to or on Saturday. Pt denies any other pain or concern. States that she was moving her chair because her cleaners were coming and she fell. She denies any dizziness/ chest pain palpitation. She lost her balance and hit her head and landed on her left side and came to er in ems for pain and not able to ambulate.  Today pt is alert,awake denies any complaints.[email or web address] from orthopedic is seeing her with plan for nail fixation of left leg.  Review of Systems: As per HPI otherwise 10 point review of systems negative.   Past Medical History:  Diagnosis Date  . Allergy   . Atrial fibrillation ([place])   . CAD (coronary artery disease)   . Cancer (HCC)    UTERINE  . Chronic anticoagulation   . Chronic diarrhea   . Decubitus ulcer    sacral region  . Diabetes ([place])    diet controlled  . Dysuria   . Edema of lower extremity    mainly right foot, slightly in left foot.  . Fibrocystic breast disease   . GERD (gastroesophageal reflux disease)   . Glaucoma   . Glaucoma   . Hematuria   . Hemorrhoids   . History of colon polyps   . History of pancreatitis   . Hyperlipidemia   . Hypertension   . Hypokalemia   . IBS (irritable bowel syndrome)   . Microscopic hematuria   . Mitral valve regurgitation   . Osteoarthritis    fingers  . Pernicious anemia   . Plantar fasciitis   . Recurrent UTI   . Skin cancer   . Sleep apnea, obstructive   . Vaginal atrophy   . Vitamin D deficiency   . Yeast vaginitis     Past Surgical History:  Procedure Laterality Date  . ABDOMINAL HYSTERECTOMY  1980's  . ABDOMINAL SURGERY     for villous polyp,,,many  years ago  . APPENDECTOMY  1940's  . ASCAD, s/p PTCA  11/28/2005   MID LESION   . BREAST BIOPSY Left 1970's  . CARPAL TUNNEL RELEASE Right 12/13/2015   Procedure: CARPAL TUNNEL RELEASE;  Surgeon: Thornton Park, MD;  Location: [facility] ORS;  Service: Orthopedics;  Laterality: Right;  . COLECTOMY  2015  . PACEMAKER PLACEMENT    . radation     for uterine cance  . REFRACTIVE SURGERY     for bilateral glaumoma  . TONSILLECTOMY  1936     reports that she has quit smoking. She has never used smokeless tobacco. She reports current alcohol use. She reports that she does not use drugs.  Allergies  Allergen Reactions  . Baycol [Cerivastatin Sodium]   . Cardizem [Diltiazem Hcl]     LOWER EXTREMITY EDEMA  . Crestor [Rosuvastatin Calcium] Other (See Comments)    INTOLERANT  . Dronedarone Other (See Comments)    Fatigue  . Metoprolol Tartrate Other (See Comments)  . Omeprazole Other (See Comments)  . Pravachol     INTOLERANT  . Statins Other (See Comments)  . Vioxx [Rofecoxib]   . Zocor [Simvastatin]     INTOLERANT    Family History  Problem Relation Age of Onset  . Coronary artery disease Father   . Kidney disease Father   . Cancer Sister   . Diabetes Other   . Cancer Mother        COLON  . Bladder Cancer Neg Hx   . Breast cancer Neg Hx      Prior to Admission medications   Medication Sig Start Date End Date Taking? Authorizing Provider  apixaban (ELIQUIS) 2.5 MG TABS tablet Take 2.5 mg by mouth 2 (two) times daily. 12/29/18  Yes [provider]  cyanocobalamin (,VITAMIN B-12,) 1000 MCG/ML injection INJECT 1ML INTO THE MUSCLE EVERY 14 DAYS 03/30/19  Yes Crecencio Mc, MD  diltiazem (CARDIZEM CD) 180 MG 24 hr capsule TAKE 1 CAPSULE BY MOUTH EVERY MORNING 12/23/18  Yes Crecencio Mc, MD  diphenoxylate-atropine (LOMOTIL) 2.5-0.025 MG tablet TAKE ONE TABLET BY MOUTH TWICE A DAY AS NEEDED FOR DIARRHEA 04/06/19  Yes Crecencio Mc, MD  metoprolol succinate (TOPROL-XL) 25  MG 24 hr tablet Take 1 tablet (25 mg total) by mouth at bedtime. 09/14/18  Yes Crecencio Mc, MD  potassium chloride (KLOR-CON) 10 MEQ tablet Take 10 mEq by mouth daily. 05/21/19  Yes [provider]  spironolactone (ALDACTONE) 25 MG tablet Take 25 mg by mouth daily.   Yes [provider]  triamcinolone cream (KENALOG) 0.1 % Apply 1 application topically 2 (two) times daily. To vulva until itching resolves,  Then reduce use to twice weekly Patient taking differently: Apply 1 application topically 2 (two) times a week.  03/25/19  Yes Crecencio Mc, MD  cholecalciferol (VITAMIN D) 1000 UNITS tablet Take 1,000 Units by mouth daily.    [provider]  Cranberry 200 MG CAPS Take 1 capsule (200 mg total) by mouth daily. 01/06/19   Jodelle Green, FNP  furosemide (LASIX) 20 MG tablet Take 20 mg by mouth daily. 03/21/19   [provider]    Physical Exam: Vitals:   06/10/19 0500 06/10/19 0612 06/10/19 0738 06/10/19 1408  BP:   (!) 125/53 (!) 124/57  Pulse:   (!) 109 92  Resp:   16 16  Temp:   98.5 F (36.9 C) 98.9 F (37.2 C)  TempSrc:   Oral Oral  SpO2:   91% 94%  Weight: 63.7 kg 67.6 kg    Height: 5\' 3"  (1.6 m)       Constitutional: NAD, calm, comfortable Vitals:   06/10/19 0500 06/10/19 0612 06/10/19 0738 06/10/19 1408  BP:   (!) 125/53 (!) 124/57  Pulse:   (!) 109 92  Resp:   16 16  Temp:   98.5 F (36.9 C) 98.9 F (37.2 C)  TempSrc:   Oral Oral  SpO2:   91% 94%  Weight: 63.7 kg 67.6 kg    Height: 5\' 3"  (1.6 m)      Eyes: PERRL, lids and conjunctivae normal ENMT: Mucous membranes are moist. Posterior pharynx clear of any exudate or lesions.Normal dentition.  Neck: normal, supple, no masses, no thyromegaly Respiratory: clear to auscultation bilaterally, no wheezing, no crackles. Normal respiratory effort. No accessory muscle use.  Cardiovascular: Irregular rate and rhythm, no murmurs / rubs / gallops. No extremity edema. 2+ pedal pulses. No  carotid bruits.  Abdomen: no tenderness, no masses palpated. No hepatosplenomegaly. Bowel sounds positive.  Musculoskeletal: no clubbing / cyanosis. No joint deformity upper and lower extremities. Good ROM, no contractures. Normal muscle tone. Left leg is ext rotated. Skin: no rashes, lesions,  ulcers. No induration Neurologic: CN 2-12 grossly intact. Sensation intact, DTR normal. Strength 5/5 in all 4.  Psychiatric: Normal judgment and insight. Alert and oriented x 3. Normal mood.   Labs on Admission: I have personally reviewed following labs and imaging studies  CBC: Recent Labs  Lab 06/09/19 1115  WBC 10.6*  NEUTROABS 8.4*  HGB 13.5  HCT 40.8  MCV 93.8  PLT [id number]   Basic Metabolic Panel: Recent Labs  Lab 06/09/19 1115  NA 138  K 4.1  CL 102  CO2 28  GLUCOSE 188*  BUN 20  CREATININE 0.87  CALCIUM 8.7*   GFR: Estimated Creatinine Clearance: 36.5 mL/min (by C-G formula based on SCr of 0.87 mg/dL). Liver Function Tests: Recent Labs  Lab 06/09/19 1115  AST 27  ALT 19  ALKPHOS 62  BILITOT 1.6*  PROT 7.2  ALBUMIN 3.9   No results for input(s): LIPASE, AMYLASE in the last 168 hours. No results for input(s): AMMONIA in the last 168 hours. Coagulation Profile: Recent Labs  Lab 06/09/19 1115  INR 1.2   Cardiac Enzymes: No results for input(s): CKTOTAL, CKMB, CKMBINDEX, TROPONINI in the last 168 hours. BNP (last 3 results) No results for input(s): PROBNP in the last 8760 hours. HbA1C: No results for input(s): HGBA1C in the last 72 hours. CBG: No results for input(s): GLUCAP in the last 168 hours. Lipid Profile: No results for input(s): CHOL, HDL, LDLCALC, TRIG, CHOLHDL, LDLDIRECT in the last 72 hours. Thyroid Function Tests: No results for input(s): TSH, T4TOTAL, FREET4, T3FREE, THYROIDAB in the last 72 hours. Anemia Panel: No results for input(s): VITAMINB12, FOLATE, FERRITIN, TIBC, IRON, RETICCTPCT in the last 72 hours. Urine analysis:    Component Value  Date/Time   COLORURINE YELLOW (A) 06/09/2019 1117   APPEARANCEUR CLEAR (A) 06/09/2019 1117   APPEARANCEUR Clear 05/27/2019 1002   LABSPEC 1.010 06/09/2019 1117   LABSPEC 1.016 12/26/2011 1844   PHURINE 6.0 06/09/2019 1117   GLUCOSEU NEGATIVE 06/09/2019 1117   GLUCOSEU NEGATIVE 12/10/2018 1020   HGBUR NEGATIVE 06/09/2019 [place] 06/09/2019 1117   BILIRUBINUR Negative 05/27/2019 1002   BILIRUBINUR Negative 12/26/2011 1844   KETONESUR 5 (A) 06/09/2019 1117   PROTEINUR NEGATIVE 06/09/2019 1117   UROBILINOGEN 0.2 12/10/2018 1020   NITRITE NEGATIVE 06/09/2019 1117   LEUKOCYTESUR NEGATIVE 06/09/2019 1117   LEUKOCYTESUR 1+ 12/26/2011 1844     Radiological Exams on Admission: DG Chest 1 View  Result Date: 06/09/2019 CLINICAL DATA:  [place]. Acute left hip pain. EXAM: CHEST  1 VIEW COMPARISON:  Chest x-ray 01/14/2012 FINDINGS: Right atrial and right ventricular pacer wires are in good position. No complicating features. The heart is normal in size for age. Mild tortuosity and calcification of the thoracic aorta is noted. The lungs are clear. The bony structures appear intact. Probable remote healed left humeral neck fracture. IMPRESSION: No acute cardiopulmonary findings. Electronically Signed   By: Marijo Sanes M.D.   On: 06/09/2019 10:56   CT Head Wo Contrast  Result Date: 06/09/2019 CLINICAL DATA:  Fall, hit head EXAM: CT HEAD WITHOUT CONTRAST TECHNIQUE: Contiguous axial images were obtained from the base of the skull through the vertex without intravenous contrast. COMPARISON:  2016 FINDINGS: Brain: There is no acute intracranial hemorrhage, mass-effect, or edema. Gray-white differentiation is preserved. There is no extra-axial fluid collection. Ventricles and sulci are within normal limits in size and configuration. Patchy hypoattenuation in the supratentorial white matter is nonspecific but may reflect mild to moderate chronic microvascular  ischemic changes. Vascular: There  is atherosclerotic calcification at the skull base. Skull: Calvarium is unremarkable. Sinuses/Orbits: No acute finding. Other: None. IMPRESSION: No evidence of acute intracranial injury. Electronically Signed   By: Macy Mis M.D.   On: 06/09/2019 10:41   DG Hip Unilat W or Wo Pelvis 2-3 Views Left  Result Date: 06/09/2019 CLINICAL DATA:  Fall, acute left hip pain EXAM: DG HIP (WITH OR WITHOUT PELVIS) 2-3V LEFT COMPARISON:  CT abdomen and pelvis from 2013 FINDINGS: Comminuted intertrochanteric fracture of the proximal left femur with marked Verus angulation and impaction with superior migration of the distal portion of the femur relative to femoral head and neck. Osteopenia. Signs of injection granulomata over the iliac crest on the right. IMPRESSION: Comminuted, impacted intertrochanteric fracture of the proximal left femur with varus deformity. Electronically Signed   By: Zetta Bills M.D.   On: 06/09/2019 10:55    Assessment/Plan Principal Problem:   Femur fracture, left (HCC) Active Problems:   Atrial fibrillation (HCC)   CAD (coronary artery disease)   Essential hypertension  Fracture of left femur due to fall: Pt is scheduled to have sx on Saturday/ off eliquis until then.  dvt prophylaxis started.  A.fib: cont home regimen with Cardizem and metoprolol.  Cad: Pt to continue her aldactone/ metoprolol  Htn: Lasix 20 mg po continued.   DVT prophylaxis: Lovenox    Code Status: Full code Family Communication: None at bedside Disposition Plan: Independent living. Consults called: Orthopedic. Admission status:  Inpatient.   Para Skeans MD [facility] If 7PM-7AM, please contact night-coverage [email or web address] Password [email or web address]  06/10/2019, 3:40 PM

## 2019-06-10 NOTE — NC FL2 (Signed)
[place] LEVEL OF CARE SCREENING TOOL     IDENTIFICATION  Patient Name: Kara [name] Birthdate: 1922/10/08 Sex: female Admission Date (Current Location): 06/09/2019  [place] and [place] Number:  Engineering geologist and Address:  [facility], [address] [place], [place] [address]      Provider Number: [id number]  Attending Physician Name and Address:  Para Skeans, MD  Relative Name and Phone Number:  Art Buff  [phone]    Current Level of Care: Hospital Recommended Level of Care: [place] Prior Approval Number:    Date Approved/Denied:   PASRR Number: [id number]  Discharge Plan: SNF    Current Diagnoses: Patient Active Problem List   Diagnosis Date Noted  . Femur fracture, left ([place]) 06/09/2019  . Vaginal itching 03/25/2019  . Sciatica of right side 02/18/2019  . Vaginitis and vulvovaginitis 12/26/2018  . Educated about COVID-19 virus infection 10/12/2018  . Edema of lower extremity 11/24/2017  . Exertional dyspnea 06/16/2017  . Venous insufficiency of both lower extremities 11/24/2016  . Essential hypertension 11/11/2016  . Peripheral artery disease ([place]) 11/11/2016  . Chronic pulmonary hypertension ([place]) 10/20/2016  . Leg swelling 06/17/2016  . Type 2 diabetes mellitus with diabetic neuropathy, unspecified ([place]) 06/17/2016  . Urinary incontinence, urge 12/27/2015  . Atrophic vaginitis 09/26/2015  . Severe tricuspid valve insufficiency 07/04/2015  . Umbilical hernia without obstruction and without gangrene 06/26/2015  . Carpal tunnel syndrome of right wrist 05/29/2015  . Chronic fatigue 05/01/2015  . Left leg pain 05/01/2015  . Skin lesion 01/29/2015  . Vaginal atrophy 11/21/2014  . Urethral caruncle 11/21/2014  . Mitral valve regurgitation 05/09/2014  . History of pancreatitis 03/09/2014  . Fatigue 03/09/2014  . Other malaise and fatigue 03/09/2014  . Hypokalemia  04/12/2013  . Encounter for current long-term use of anticoagulants 10/06/2012  . Hemorrhoids 06/08/2012  . Osteoarthritis 03/08/2012  . Screening for breast cancer 03/08/2012  . Pernicious anemia 02/04/2012  . History of colectomy 02/04/2012  . Vitamin D deficiency 07/30/2011  . Anticoagulant long-term use 06/13/2011  . Atrial fibrillation ([place]) 06/13/2011  . CAD (coronary artery disease) 06/13/2011  . Chronic diarrhea 06/13/2011    Orientation RESPIRATION BLADDER Height & Weight     Self, Time, Situation, Place  Normal Continent Weight: 149 lb 1.6 oz (67.6 kg) Height:  5\' 3"  (160 cm)  BEHAVIORAL SYMPTOMS/MOOD NEUROLOGICAL BOWEL NUTRITION STATUS      Continent Diet(Regular thin liquid diet)  AMBULATORY STATUS COMMUNICATION OF NEEDS Skin   Extensive Assist Verbally Surgical wounds                       Personal Care Assistance Level of Assistance  Bathing, Dressing Bathing Assistance: Limited assistance   Dressing Assistance: Limited assistance     Functional Limitations Info  Sight Sight Info: Impaired(glasses)        SPECIAL CARE FACTORS FREQUENCY  PT (By licensed PT)     PT Frequency: 5x week              Contractures Contractures Info: Not present    Additional Factors Info  Code Status, Allergies Code Status Info: Full Code Allergies Info: Baycol, Stains, Cardizem, Crestor, Omprazole, pravachol, zocor and Vioxx           Current Medications (06/10/2019):  This is the current hospital active medication list Current Facility-Administered Medications  Medication Dose Route Frequency Provider Last Rate Last Admin  . enoxaparin (LOVENOX)  injection 40 mg  40 mg Subcutaneous Q24H Para Skeans, MD   40 mg at 06/09/19 2207  . fentaNYL (SUBLIMAZE) injection 50 mcg  50 mcg Intravenous Q1H PRN Merlyn Lot, MD      . furosemide (LASIX) tablet 20 mg  20 mg Oral Daily Para Skeans, MD   20 mg at 06/10/19 0900  . HYDROcodone-acetaminophen  (NORCO/VICODIN) 5-325 MG per tablet 1 tablet  1 tablet Oral Q4H PRN Earnestine Leys, MD   1 tablet at 06/09/19 2207  . morphine 2 MG/ML injection 1 mg  1 mg Intravenous Q2H PRN Earnestine Leys, MD   1 mg at 06/10/19 0158  . potassium chloride SA (KLOR-CON) CR tablet 10 mEq  10 mEq Oral Daily Para Skeans, MD   10 mEq at 06/10/19 0901  . spironolactone (ALDACTONE) tablet 25 mg  25 mg Oral Daily Para Skeans, MD   25 mg at 06/10/19 0901     Discharge Medications: Please see discharge summary for a list of discharge medications.  Relevant Imaging Results:  Relevant Lab Results:   Additional Information SSN:[id number]  Elease Hashimoto, LCSW

## 2019-06-11 ENCOUNTER — Inpatient Hospital Stay: Payer: Medicare Other

## 2019-06-11 ENCOUNTER — Encounter
Admission: EM | Disposition: A | Discharge status: Skilled Nursing Facility | Payer: Self-pay | Source: Home / Self Care | Attending: Internal Medicine

## 2019-06-11 ENCOUNTER — Inpatient Hospital Stay: Payer: Medicare Other | Admitting: Anesthesiology

## 2019-06-11 DIAGNOSIS — I10 Essential (primary) hypertension: Secondary | ICD-10-CM

## 2019-06-11 DIAGNOSIS — I2510 Atherosclerotic heart disease of native coronary artery without angina pectoris: Secondary | ICD-10-CM

## 2019-06-11 HISTORY — PX: INTRAMEDULLARY (IM) NAIL INTERTROCHANTERIC: SHX5875

## 2019-06-11 LAB — CBC
HCT: 37.5 % (ref 36.0–46.0)
Hemoglobin: 12.5 g/dL (ref 12.0–15.0)
MCH: 31 pg (ref 26.0–34.0)
MCHC: 33.3 g/dL (ref 30.0–36.0)
MCV: 93.1 fL (ref 80.0–100.0)
Platelets: 155 10*3/uL (ref 150–400)
RBC: 4.03 MIL/uL (ref 3.87–5.11)
RDW: 12.7 % (ref 11.5–15.5)
WBC: 10.7 10*3/uL — ABNORMAL HIGH (ref 4.0–10.5)
nRBC: 0 % (ref 0.0–0.2)

## 2019-06-11 LAB — CREATININE, SERUM
Creatinine, Ser: 0.77 mg/dL (ref 0.44–1.00)
GFR calc Af Amer: >60 mL/min (ref >60)
GFR calc non Af Amer: >60 mL/min (ref >60)

## 2019-06-11 LAB — GLUCOSE, CAPILLARY: Glucose-Capillary: 172 mg/dL — ABNORMAL HIGH (ref 70–99)

## 2019-06-11 SURGERY — FIXATION, FRACTURE, INTERTROCHANTERIC, WITH INTRAMEDULLARY ROD
Anesthesia: General | Laterality: Left

## 2019-06-11 MED ORDER — MAGNESIUM HYDROXIDE 400 MG/5ML PO SUSP: 30.0000 mL | Freq: Every day | ORAL | Status: DC | PRN

## 2019-06-11 MED ORDER — ZOLPIDEM TARTRATE 5 MG PO TABS: 5.0000 mg | ORAL_TABLET | Freq: Every evening | ORAL | Status: DC | PRN

## 2019-06-11 MED ORDER — BISACODYL 10 MG RE SUPP: 10.0000 mg | Freq: Every day | RECTAL | Status: DC | PRN

## 2019-06-11 MED ORDER — CEFAZOLIN SODIUM-DEXTROSE 2-4 GM/100ML-% IV SOLN
2.0000 g | Freq: Three times a day (TID) | INTRAVENOUS | Status: AC
  Administered 2019-06-11 – 2019-06-12 (×3): 2 g via INTRAVENOUS
  Filled 2019-06-11 (×4): qty 100

## 2019-06-11 MED ORDER — CLINDAMYCIN PHOSPHATE 600 MG/50ML IV SOLN
600.0000 mg | Freq: Three times a day (TID) | INTRAVENOUS | Status: AC
  Administered 2019-06-11 – 2019-06-12 (×3): 600 mg via INTRAVENOUS
  Filled 2019-06-11 (×4): qty 50

## 2019-06-11 MED ORDER — APIXABAN 2.5 MG PO TABS
2.5000 mg | ORAL_TABLET | Freq: Two times a day (BID) | ORAL | Status: DC
  Administered 2019-06-12 – 2019-06-14 (×5): 2.5 mg via ORAL
  Filled 2019-06-11 (×5): qty 1

## 2019-06-11 MED ORDER — ALUM & MAG HYDROXIDE-SIMETH 200-200-20 MG/5ML PO SUSP: 30.0000 mL | ORAL | Status: DC | PRN

## 2019-06-11 MED ORDER — FERROUS SULFATE 325 (65 FE) MG PO TABS
325.0000 mg | ORAL_TABLET | Freq: Every day | ORAL | Status: DC
  Administered 2019-06-12 – 2019-06-14 (×3): 325 mg via ORAL
  Filled 2019-06-11 (×3): qty 1

## 2019-06-11 MED ORDER — LACTATED RINGERS IV SOLN: INTRAVENOUS | Status: DC | PRN

## 2019-06-11 MED ORDER — METOCLOPRAMIDE HCL 5 MG/ML IJ SOLN: 5.0000 mg | Freq: Three times a day (TID) | INTRAMUSCULAR | Status: DC | PRN

## 2019-06-11 MED ORDER — HYDROCODONE-ACETAMINOPHEN 5-325 MG PO TABS
1.0000 | ORAL_TABLET | Freq: Four times a day (QID) | ORAL | Status: DC | PRN
  Administered 2019-06-11: 1 via ORAL
  Filled 2019-06-11 (×3): qty 1

## 2019-06-11 MED ORDER — MORPHINE SULFATE (PF) 2 MG/ML IV SOLN: 1.0000 mg | INTRAVENOUS | Status: DC | PRN

## 2019-06-11 MED ORDER — MENTHOL 3 MG MT LOZG
1.0000 | LOZENGE | OROMUCOSAL | Status: DC | PRN
  Filled 2019-06-11: qty 9

## 2019-06-11 MED ORDER — FENTANYL CITRATE (PF) 100 MCG/2ML IJ SOLN: 25.0000 ug | INTRAMUSCULAR | Status: DC | PRN

## 2019-06-11 MED ORDER — SENNA 8.6 MG PO TABS
1.0000 | ORAL_TABLET | Freq: Two times a day (BID) | ORAL | Status: DC
  Administered 2019-06-11 – 2019-06-14 (×3): 8.6 mg via ORAL
  Filled 2019-06-11 (×5): qty 1

## 2019-06-11 MED ORDER — ONDANSETRON HCL 4 MG/2ML IJ SOLN
4.0000 mg | Freq: Four times a day (QID) | INTRAMUSCULAR | Status: DC | PRN
  Administered 2019-06-11: 4 mg via INTRAVENOUS
  Filled 2019-06-11: qty 2

## 2019-06-11 MED ORDER — FLEET ENEMA 7-19 GM/118ML RE ENEM: 1.0000 | ENEMA | Freq: Once | RECTAL | Status: DC | PRN

## 2019-06-11 MED ORDER — PHENYLEPHRINE HCL (PRESSORS) 10 MG/ML IV SOLN
INTRAVENOUS | Status: DC | PRN
  Administered 2019-06-11 (×2): 50 ug via INTRAVENOUS

## 2019-06-11 MED ORDER — ONDANSETRON HCL 4 MG PO TABS: 4.0000 mg | ORAL_TABLET | Freq: Four times a day (QID) | ORAL | Status: DC | PRN

## 2019-06-11 MED ORDER — PHENOL 1.4 % MT LIQD
1.0000 | OROMUCOSAL | Status: DC | PRN
  Filled 2019-06-11: qty 177

## 2019-06-11 MED ORDER — METOCLOPRAMIDE HCL 10 MG PO TABS: 5.0000 mg | ORAL_TABLET | Freq: Three times a day (TID) | ORAL | Status: DC | PRN

## 2019-06-11 MED ORDER — SODIUM CHLORIDE 0.45 % IV SOLN: INTRAVENOUS | Status: DC

## 2019-06-11 MED ORDER — ENSURE ENLIVE PO LIQD
237.0000 mL | Freq: Two times a day (BID) | ORAL | Status: DC
  Administered 2019-06-11 – 2019-06-14 (×5): 237 mL via ORAL

## 2019-06-11 MED ORDER — FENTANYL CITRATE (PF) 100 MCG/2ML IJ SOLN
INTRAMUSCULAR | Status: AC
  Filled 2019-06-11: qty 2

## 2019-06-11 MED ORDER — SODIUM CHLORIDE 0.9 % IR SOLN
Status: DC | PRN
  Administered 2019-06-11: 1000 mL

## 2019-06-11 MED ORDER — HYDROCODONE-ACETAMINOPHEN 7.5-325 MG PO TABS: 1.0000 | ORAL_TABLET | Freq: Four times a day (QID) | ORAL | Status: DC | PRN

## 2019-06-11 MED ORDER — FENTANYL CITRATE (PF) 100 MCG/2ML IJ SOLN
INTRAMUSCULAR | Status: DC | PRN
  Administered 2019-06-11 (×2): 25 ug via INTRAVENOUS
  Administered 2019-06-11: 50 ug via INTRAVENOUS

## 2019-06-11 MED ORDER — BUPIVACAINE-EPINEPHRINE (PF) 0.5% -1:200000 IJ SOLN
INTRAMUSCULAR | Status: DC | PRN
  Administered 2019-06-11: 30 mL via PERINEURAL

## 2019-06-11 MED ORDER — PROPOFOL 10 MG/ML IV BOLUS
INTRAVENOUS | Status: DC | PRN
  Administered 2019-06-11: 70 mg via INTRAVENOUS

## 2019-06-11 MED ORDER — LIDOCAINE HCL (CARDIAC) PF 100 MG/5ML IV SOSY
PREFILLED_SYRINGE | INTRAVENOUS | Status: DC | PRN
  Administered 2019-06-11: 60 mg via INTRAVENOUS

## 2019-06-11 MED ORDER — ACETAMINOPHEN 325 MG PO TABS: 325.0000 mg | ORAL_TABLET | Freq: Four times a day (QID) | ORAL | Status: DC | PRN

## 2019-06-11 MED ORDER — OXYCODONE HCL 5 MG/5ML PO SOLN: 5.0000 mg | Freq: Once | ORAL | Status: DC | PRN

## 2019-06-11 MED ORDER — PROPOFOL 10 MG/ML IV BOLUS
INTRAVENOUS | Status: AC
  Filled 2019-06-11: qty 20

## 2019-06-11 MED ORDER — SUCCINYLCHOLINE CHLORIDE 20 MG/ML IJ SOLN
INTRAMUSCULAR | Status: DC | PRN
  Administered 2019-06-11: 80 mg via INTRAVENOUS

## 2019-06-11 MED ORDER — OXYCODONE HCL 5 MG PO TABS: 5.0000 mg | ORAL_TABLET | Freq: Once | ORAL | Status: DC | PRN

## 2019-06-11 SURGICAL SUPPLY — 42 items
BIT DRILL FLUTED FEMUR 4.2/3 (BIT) ×1 IMPLANT
BLADE TFNA HELICAL 90 STERILE (Anchor) ×1 IMPLANT
BNDG COHESIVE 4X5 TAN STRL (GAUZE/BANDAGES/DRESSINGS) ×3 IMPLANT
CANISTER SUCT 1200ML W/VALVE (MISCELLANEOUS) ×2 IMPLANT
CHLORAPREP W/TINT 26 (MISCELLANEOUS) ×4 IMPLANT
COVER WAND RF STERILE (DRAPES) ×2 IMPLANT
DRAPE C-ARMOR (DRAPES) IMPLANT
DRAPE INCISE 23X17 IOBAN STRL (DRAPES) ×1
DRAPE INCISE 23X17 STRL (DRAPES) ×1 IMPLANT
DRAPE INCISE IOBAN 23X17 STRL (DRAPES) ×1 IMPLANT
DRSG AQUACEL AG ADV 3.5X10 (GAUZE/BANDAGES/DRESSINGS) IMPLANT
DRSG AQUACEL AG ADV 3.5X14 (GAUZE/BANDAGES/DRESSINGS) IMPLANT
ELECT REM PT RETURN 9FT ADLT (ELECTROSURGICAL) ×2
ELECTRODE REM PT RTRN 9FT ADLT (ELECTROSURGICAL) ×1 IMPLANT
GAUZE SPONGE 4X4 12PLY STRL (GAUZE/BANDAGES/DRESSINGS) ×2 IMPLANT
GAUZE XEROFORM 1X8 LF (GAUZE/BANDAGES/DRESSINGS) IMPLANT
GLOVE INDICATOR 8.0 STRL GRN (GLOVE) ×2 IMPLANT
GLOVE SURG ORTHO 8.5 STRL (GLOVE) ×2 IMPLANT
GOWN STRL REUS W/ TWL LRG LVL3 (GOWN DISPOSABLE) ×1 IMPLANT
GOWN STRL REUS W/TWL LRG LVL3 (GOWN DISPOSABLE) ×1
GOWN STRL REUS W/TWL LRG LVL4 (GOWN DISPOSABLE) ×2 IMPLANT
IMPL DEG TI CANN 11MM/130 (Orthopedic Implant) IMPLANT
IMPLANT DEG TI CANN 11MM/130 (Orthopedic Implant) ×2 IMPLANT
KIT TURNOVER KIT A (KITS) ×2 IMPLANT
MAT ABSORB  FLUID 56X50 GRAY (MISCELLANEOUS) ×1
MAT ABSORB FLUID 56X50 GRAY (MISCELLANEOUS) ×1 IMPLANT
NDL SPNL 18GX3.5 QUINCKE PK (NEEDLE) ×1 IMPLANT
NEEDLE SPNL 18GX3.5 QUINCKE PK (NEEDLE) ×2 IMPLANT
NS IRRIG 500ML POUR BTL (IV SOLUTION) ×2 IMPLANT
PACK HIP COMPR (MISCELLANEOUS) ×2 IMPLANT
REAMER ROD DEEP FLUTE 2.5X950 (INSTRUMENTS) ×1 IMPLANT
SCREW LOCK T25 FT 36X5X4.3X (Screw) IMPLANT
SCREW LOCKING 5.0X36MM (Screw) ×1 IMPLANT
SOL PREP PVP 2OZ (MISCELLANEOUS) ×2
SOLUTION PREP PVP 2OZ (MISCELLANEOUS) ×1 IMPLANT
STAPLER SKIN PROX 35W (STAPLE) ×2 IMPLANT
SUCTION FRAZIER HANDLE 10FR (MISCELLANEOUS) ×1
SUCTION TUBE FRAZIER 10FR DISP (MISCELLANEOUS) ×1 IMPLANT
SUT VIC AB 0 CT1 36 (SUTURE) ×2 IMPLANT
SUT VIC AB 2-0 CT1 27 (SUTURE) ×1
SUT VIC AB 2-0 CT1 TAPERPNT 27 (SUTURE) ×1 IMPLANT
SYR 30ML LL (SYRINGE) ×2 IMPLANT

## 2019-06-11 NOTE — Transfer of Care (Signed)
Immediate Anesthesia Transfer of Care Note  Patient: Kara [name]  Procedure(s) Performed: INTRAMEDULLARY (IM) NAIL INTERTROCHANTRIC (Left )  Patient Location: PACU  Anesthesia Type:General  Level of Consciousness: awake  Airway & Oxygen Therapy: Patient Spontanous Breathing and Patient connected to face mask oxygen  Post-op Assessment: Report given to RN  Post vital signs: Reviewed and stable  Last Vitals:  Vitals Value Taken Time  BP 134/68 06/11/19 0950  Temp 37 C 06/11/19 0950  Pulse 78 06/11/19 0950  Resp 20 06/11/19 0950  SpO2 100 % 06/11/19 0950    Last Pain:  Vitals:   06/11/19 0551  TempSrc: Oral  PainSc:       Patients Stated Pain Goal: 0 ([id number])  Complications: No apparent anesthesia complications

## 2019-06-11 NOTE — Anesthesia Procedure Notes (Signed)
Procedure Name: Intubation Performed by: Lesle Reek, CRNA Pre-anesthesia Checklist: Patient identified, Emergency Drugs available, Patient being monitored, Timeout performed and Suction available Patient Re-evaluated:Patient Re-evaluated prior to induction Oxygen Delivery Method: Circle system utilized Preoxygenation: Pre-oxygenation with 100% oxygen Induction Type: IV induction Laryngoscope Size: Mac and 4 Grade View: Grade I Tube type: Oral Tube size: 6.5 mm Number of attempts: 1 Airway Equipment and Method: Stylet Placement Confirmation: ETT inserted through vocal cords under direct vision,  positive ETCO2,  CO2 detector and breath sounds checked- equal and bilateral Secured at: 21 cm Tube secured with: Tape

## 2019-06-11 NOTE — Op Note (Signed)
DATE OF SURGERY:  06/11/2019  TIME: 9:43 AM  PATIENT NAME:  Kara [name]  AGE: 86 y.o.  PRE-OPERATIVE DIAGNOSIS:  Left intertrochanteric hip  fracture  POST-OPERATIVE DIAGNOSIS:  SAME  PROCEDURE:  INTRAMEDULLARY (IM) NAIL INTERTROCHANTRIC HIP FRACTURE LEFT HIP  SURGEON:  Park Breed  ASST:  EBL: 50 cc  COMPLICATIONS: None  OPERATIVE IMPLANTS: Synthes trochanteric femoral nail 11 mm / 130 degrees with interlocking helical blade 90 mm and distal locking screw 36 mm.  PREOPERATIVE INDICATIONS:  Kara [name] is a 86 y.o. year old who fell and suffered a hip fracture. She was brought into the ER and then admitted and optimized and then elected for surgical intervention.    The risks benefits and alternatives were discussed with the patient including but not limited to the risks of nonoperative treatment, versus surgical intervention including infection, bleeding, nerve injury, malunion, nonunion, hardware prominence, hardware failure, need for hardware removal, blood clots, cardiopulmonary complications, morbidity, mortality, among others, and they were willing to proceed.    OPERATIVE PROCEDURE:  The patient was brought to the operating room and placed in the supine position.  General LMA anesthesia was administered. She was placed on the fracture table.  Closed reduction was performed under C-arm guidance. The length of the femur was also measured using fluoroscopy. Time out was then performed after sterile prep and drape. She received preoperative antibiotics.  Incision was made proximal to the greater trochanter. A guidewire was placed in the appropriate position. Confirmation was made on AP and lateral views. The above-named nail was opened. I opened the proximal femur with a reamer. I then placed the nail by hand easily down. I did not need to ream the femur.  Once the nail was completely seated, I placed a guidepin into the femoral head into the center center position through  a second incision.  I measured the length, and then reamed the lateral cortex and up into the head. I then placed the helical blade. Slight compression was applied. Anatomic fixation achieved. Bone quality was mediocre.  I then secured the proximal interlock.  The distal locking screw was then placed and after confirming the position of the fracture fragments and hardware I then removed the instruments, and took final C-arm pictures AP and lateral the entire length of the leg. Anatomic reconstruction was achieved, and the wounds were irrigated copiously and closed with Vicryl  followed by staples and dry sterile dressing. Sponge and needle count were correct.   The patient was awakened and returned to PACU in stable and satisfactory condition. There no complications and the patient tolerated the procedure well.  She will be partial weightbearing as tolerated, and will be on Lovenox  For DVT prophylaxis.     Park Breed, M.D.

## 2019-06-11 NOTE — H&P (Signed)
THE PATIENT WAS SEEN PRIOR TO SURGERY TODAY.  HISTORY, ALLERGIES, HOME MEDICATIONS AND OPERATIVE PROCEDURE WERE REVIEWED. RISKS AND BENEFITS OF SURGERY DISCUSSED WITH PATIENT AGAIN.  NO CHANGES FROM INITIAL HISTORY AND PHYSICAL NOTED.    

## 2019-06-11 NOTE — Progress Notes (Signed)
Spoke with Kara [name] pt with bp of 99/46 and has 25 metoprolol ordered. May hold medication for not if blood pressure improves may give it.

## 2019-06-11 NOTE — Anesthesia Postprocedure Evaluation (Signed)
Anesthesia Post Note  Patient: Kara [name]  Procedure(s) Performed: INTRAMEDULLARY (IM) NAIL INTERTROCHANTRIC (Left )  Patient location during evaluation: PACU Anesthesia Type: General Level of consciousness: awake and alert Pain management: pain level controlled Vital Signs Assessment: post-procedure vital signs reviewed and stable Respiratory status: spontaneous breathing, nonlabored ventilation and respiratory function stable Cardiovascular status: blood pressure returned to baseline and stable Postop Assessment: no apparent nausea or vomiting Anesthetic complications: no     Last Vitals:  Vitals:   06/11/19 1112 06/11/19 1246  BP: 130/68 132/66  Pulse: 94   Resp: 18 18  Temp: 36.7 C 36.4 C  SpO2: 98% 93%    Last Pain:  Vitals:   06/11/19 1246  TempSrc: Oral  PainSc:                  Tera Mater

## 2019-06-11 NOTE — Progress Notes (Signed)
PROGRESS NOTE    Kara [name]  [id number] DOB: 1923-04-18 DOA: 06/09/2019 PCP: Crecencio Mc, MD    Brief Narrative:  HPI: Kara [name] is a 86 y.o. female with medical history significant of heart disease seen in ed for mechanical fall while moving furniture pt is to stop her eliquis and go to or on Saturday. Pt denies any other pain or concern. States that she was moving her chair because her cleaners were coming and she fell. She denies any dizziness/ chest pain palpitation. She lost her balance and hit her head and landed on her left side and came to er in ems for pain and not able to ambulate.  1/23: Postop day 0 status post operative fixation of hip fracture.  Patient seen and examined on the floor.  Pain well controlled.  Patient in no visible distress.   Assessment & Plan:   Principal Problem:   Femur fracture, left (HCC) Active Problems:   Atrial fibrillation (HCC)   CAD (coronary artery disease)   Essential hypertension  Fracture of left femur Mechanical fall POD#0 status post operative fixation Pain well controlled at the moment Advance diet to heart healthy Start physical therapy/occupational therapy on postoperative day 1 Restart apixaban 2.5 mg twice daily on 06/12/2019  Atrial fibrillation Continue home Cardizem Continue home metoprolol Restart apixaban as above  Coronary artery disease HTN Continue Toprol as above On aldactone, will continue On lasix, will continue   DVT prophylaxis: apixaban Code Status: Full Family Communication: none today Disposition Plan: anticipate SNF   Consultants:   Orthopedic surgery  Procedures:   Intramedullary nail, left hip (06/11/19)  Antimicrobials:  Clindamycin    Subjective: Seen and examined POD#0 s/p IM nail placement Pain controlled No complaints  Objective: Vitals:   06/11/19 1034 06/11/19 1112 06/11/19 1246 06/11/19 1355  BP: (!) 113/58 130/68 132/66 (!) 131/43  Pulse: 87 94  90  Resp:  18 18 18 16   Temp:  98.1 F (36.7 C) 97.6 F (36.4 C) 98.5 F (36.9 C)  TempSrc:   Oral Oral  SpO2: 93% 98% 93% 98%  Weight:      Height:        Intake/Output Summary (Last 24 hours) at 06/11/2019 1423 Last data filed at 06/11/2019 1024 Gross per 24 hour  Intake 750 ml  Output 1400 ml  Net -650 ml   Filed Weights   06/09/19 0936 06/10/19 0500 06/10/19 0612  Weight: 59 kg 63.7 kg 67.6 kg    Examination:  General exam: Appears calm and comfortable  Respiratory system: Clear to auscultation. Respiratory effort normal. Cardiovascular system: S1 & S2 heard, RRR. No JVD, murmurs, rubs, gallops or clicks. No pedal edema. Gastrointestinal system: Abdomen is nondistended, soft and nontender. No organomegaly or masses felt. Normal bowel sounds heard. Central nervous system: Alert and oriented. No focal neurological deficits. Extremities: Symmetric 5 x 5 power. Skin: No rashes, lesions or ulcers Psychiatry: Judgement and insight appear normal. Mood & affect appropriate.     Data Reviewed: I have personally reviewed following labs and imaging studies  CBC: Recent Labs  Lab 06/09/19 1115 06/11/19 0609  WBC 10.6* 10.7*  NEUTROABS 8.4*  --   HGB 13.5 12.5  HCT 40.8 37.5  MCV 93.8 93.1  PLT 167 [id number]   Basic Metabolic Panel: Recent Labs  Lab 06/09/19 1115 06/11/19 0609  NA 138  --   K 4.1  --   CL 102  --   CO2 28  --  GLUCOSE 188*  --   BUN 20  --   CREATININE 0.87 0.77  CALCIUM 8.7*  --    GFR: Estimated Creatinine Clearance: 39.7 mL/min (by C-G formula based on SCr of 0.77 mg/dL). Liver Function Tests: Recent Labs  Lab 06/09/19 1115  AST 27  ALT 19  ALKPHOS 62  BILITOT 1.6*  PROT 7.2  ALBUMIN 3.9   No results for input(s): LIPASE, AMYLASE in the last 168 hours. No results for input(s): AMMONIA in the last 168 hours. Coagulation Profile: Recent Labs  Lab 06/09/19 1115  INR 1.2   Cardiac Enzymes: No results for input(s): CKTOTAL, CKMB, CKMBINDEX,  TROPONINI in the last 168 hours. BNP (last 3 results) No results for input(s): PROBNP in the last 8760 hours. HbA1C: No results for input(s): HGBA1C in the last 72 hours. CBG: Recent Labs  Lab 06/11/19 0956  GLUCAP 172*   Lipid Profile: No results for input(s): CHOL, HDL, LDLCALC, TRIG, CHOLHDL, LDLDIRECT in the last 72 hours. Thyroid Function Tests: No results for input(s): TSH, T4TOTAL, FREET4, T3FREE, THYROIDAB in the last 72 hours. Anemia Panel: No results for input(s): VITAMINB12, FOLATE, FERRITIN, TIBC, IRON, RETICCTPCT in the last 72 hours. Sepsis Labs: No results for input(s): PROCALCITON, LATICACIDVEN in the last 168 hours.  Recent Results (from the past 240 hour(s))  Respiratory Panel by RT PCR (Flu A&B, Covid) - Nasopharyngeal Swab     Status: None   Collection Time: 06/09/19  9:39 AM   Specimen: Nasopharyngeal Swab  Result Value Ref Range Status   SARS Coronavirus 2 by RT PCR NEGATIVE NEGATIVE Final    Comment: (NOTE) SARS-CoV-2 target nucleic acids are NOT DETECTED. The SARS-CoV-2 RNA is generally detectable in upper respiratoy specimens during the acute phase of infection. The lowest concentration of SARS-CoV-2 viral copies this assay can detect is 131 copies/mL. A negative result does not preclude SARS-Cov-2 infection and should not be used as the sole basis for treatment or other patient management decisions. A negative result may occur with  improper specimen collection/handling, submission of specimen other than nasopharyngeal swab, presence of viral mutation(s) within the areas targeted by this assay, and inadequate number of viral copies (<131 copies/mL). A negative result must be combined with clinical observations, patient history, and epidemiological information. The expected result is Negative. Fact Sheet for Patients:  PinkCheek.be Fact Sheet for Healthcare Providers:  GravelBags.it This test  is not yet ap proved or cleared by the [place] FDA and  has been authorized for detection and/or diagnosis of SARS-CoV-2 by FDA under an Emergency Use Authorization (EUA). This EUA will remain  in effect (meaning this test can be used) for the duration of the COVID-19 declaration under Section 564(b)(1) of the Act, 21 U.S.C. section 360bbb-3(b)(1), unless the authorization is terminated or revoked sooner.    Influenza A by PCR NEGATIVE NEGATIVE Final   Influenza B by PCR NEGATIVE NEGATIVE Final    Comment: (NOTE) The Xpert Xpress SARS-CoV-2/FLU/RSV assay is intended as an aid in  the diagnosis of influenza from Nasopharyngeal swab specimens and  should not be used as a sole basis for treatment. Nasal washings and  aspirates are unacceptable for Xpert Xpress SARS-CoV-2/FLU/RSV  testing. Fact Sheet for Patients: PinkCheek.be Fact Sheet for Healthcare Providers: GravelBags.it This test is not yet approved or cleared by the [place] FDA and  has been authorized for detection and/or diagnosis of SARS-CoV-2 by  FDA under an Emergency Use Authorization (EUA). This EUA will remain  in effect (  meaning this test can be used) for the duration of the  Covid-19 declaration under Section 564(b)(1) of the Act, 21  U.S.C. section 360bbb-3(b)(1), unless the authorization is  terminated or revoked. Performed at [facility], [address] [place], [place] [address]   Surgical PCR screen     Status: None   Collection Time: 06/10/19  6:22 AM   Specimen: Nasal Mucosa; Nasal Swab  Result Value Ref Range Status   MRSA, PCR NEGATIVE NEGATIVE Final   Staphylococcus aureus NEGATIVE NEGATIVE Final    Comment: (NOTE) The Xpert SA Assay (FDA approved for NASAL specimens in patients 39 years of age and older), is one component of a comprehensive surveillance program. It is not intended to diagnose infection nor to guide or  monitor treatment. Performed at [facility], [address] [place], [place] [address]          Radiology Studies: DG HIP OPERATIVE UNILAT W OR W/O PELVIS LEFT  Result Date: 06/11/2019 CLINICAL DATA:  Left hip IM nail EXAM: OPERATIVE LEFT HIP (WITH PELVIS IF PERFORMED) 3 VIEWS TECHNIQUE: Fluoroscopic spot image(s) were submitted for interpretation post-operatively. FLUOROSCOPY TIME:  30 seconds COMPARISON:  Left hip radiographs dated 06/09/2019 FINDINGS: Intraoperative fluoroscopic radiographs during IM nail with hip screw and distal interlocking screw fixation of an intertrochanteric left hip fracture. Fracture fragments are in near anatomic alignment and position. IMPRESSION: Intraoperative fluoroscopic radiographs, as above. Electronically Signed   By: Julian Hy M.D.   On: 06/11/2019 11:28        Scheduled Meds: . [START ON 06/12/2019] apixaban  2.5 mg Oral BID  . diltiazem  180 mg Oral q morning - 10a  . feeding supplement (ENSURE ENLIVE)  237 mL Oral BID BM  . [START ON 06/12/2019] ferrous sulfate  325 mg Oral Q breakfast  . furosemide  20 mg Oral Daily  . metoprolol succinate  25 mg Oral QHS  . potassium chloride SA  10 mEq Oral Daily  . senna  1 tablet Oral BID  . spironolactone  25 mg Oral Daily   Continuous Infusions: . sodium chloride 75 mL/hr at 06/11/19 1120  .  ceFAZolin (ANCEF) IV    . clindamycin (CLEOCIN) IV       LOS: 2 days    Time spent: 35 minutes    Sidney Ace, MD [facility] Pager [phone]  If 7PM-7AM, please contact night-coverage [email or web address] Password [facility] 06/11/2019, 2:23 PM

## 2019-06-12 LAB — BASIC METABOLIC PANEL
Anion gap: 10 (ref 5–15)
BUN: 13 mg/dL (ref 8–23)
CO2: 32 mmol/L (ref 22–32)
Calcium: 8 mg/dL — ABNORMAL LOW (ref 8.9–10.3)
Chloride: 90 mmol/L — ABNORMAL LOW (ref 98–111)
Creatinine, Ser: 0.82 mg/dL (ref 0.44–1.00)
GFR calc Af Amer: >60 mL/min (ref >60)
GFR calc non Af Amer: >60 mL/min (ref >60)
Glucose, Bld: 204 mg/dL — ABNORMAL HIGH (ref 70–99)
Potassium: 3.3 mmol/L — ABNORMAL LOW (ref 3.5–5.1)
Sodium: 132 mmol/L — ABNORMAL LOW (ref 135–145)

## 2019-06-12 LAB — CBC
HCT: 32.4 % — ABNORMAL LOW (ref 36.0–46.0)
Hemoglobin: 10.7 g/dL — ABNORMAL LOW (ref 12.0–15.0)
MCH: 31.4 pg (ref 26.0–34.0)
MCHC: 33 g/dL (ref 30.0–36.0)
MCV: 95 fL (ref 80.0–100.0)
Platelets: 138 10*3/uL — ABNORMAL LOW (ref 150–400)
RBC: 3.41 MIL/uL — ABNORMAL LOW (ref 3.87–5.11)
RDW: 12.9 % (ref 11.5–15.5)
WBC: 10.8 10*3/uL — ABNORMAL HIGH (ref 4.0–10.5)
nRBC: 0 % (ref 0.0–0.2)

## 2019-06-12 MED ORDER — METOPROLOL SUCCINATE ER 25 MG PO TB24
12.5000 mg | ORAL_TABLET | Freq: Every day | ORAL | Status: DC
  Administered 2019-06-12: 12.5 mg via ORAL
  Filled 2019-06-12: qty 1

## 2019-06-12 MED ORDER — POTASSIUM CHLORIDE CRYS ER 20 MEQ PO TBCR
40.0000 meq | EXTENDED_RELEASE_TABLET | Freq: Once | ORAL | Status: AC
  Administered 2019-06-12: 40 meq via ORAL
  Filled 2019-06-12: qty 2

## 2019-06-12 MED ORDER — POTASSIUM CHLORIDE 20 MEQ PO PACK
40.0000 meq | PACK | Freq: Every day | ORAL | Status: DC
  Administered 2019-06-12 – 2019-06-14 (×3): 40 meq via ORAL
  Filled 2019-06-12 (×3): qty 2

## 2019-06-12 NOTE — Progress Notes (Signed)
Subjective: 1 Day Post-Op Procedure(s) (LRB): INTRAMEDULLARY (IM) NAIL INTERTROCHANTRIC (Left)   Patient is alert and comfortable lying in bed.  Left leg alignment is good.  Pain is better than preoperatively.  She got out of bed today and started PT.  Hemoglobin stable.  Potassium is a little low at 3.3 and replacement is ordered.  Dressings are dry and intact. Patient reports pain as mild.  Objective:   VITALS:   Vitals:   06/12/19 1027 06/12/19 1546  BP: 106/67 (!) 139/56  Pulse: 88 72  Resp:  16  Temp:  98.8 F (37.1 C)  SpO2:  96%    Neurologically intact ABD soft Neurovascular intact Sensation intact distally Intact pulses distally Dorsiflexion/Plantar flexion intact Incision: no drainage  LABS Recent Labs    06/11/19 0609 06/12/19 0603  HGB 12.5 10.7*  HCT 37.5 32.4*  WBC 10.7* 10.8*  PLT 155 138*    Recent Labs    06/11/19 0609 06/12/19 0603  NA  --  132*  K  --  3.3*  BUN  --  13  CREATININE 0.77 0.82  GLUCOSE  --  204*    No results for input(s): LABPT, INR in the last 72 hours.   Assessment/Plan: 1 Day Post-Op Procedure(s) (LRB): INTRAMEDULLARY (IM) NAIL INTERTROCHANTRIC (Left)   Advance diet Up with therapy D/C IV fluids Discharge to SNF

## 2019-06-12 NOTE — Progress Notes (Signed)
Physical Therapy Evaluation Patient Details Name: Kara [name] MRN: [id number] DOB: August 05, 1922 Today's Date: 06/12/2019   History of Present Illness  [name] a 86 y.o.femalewith medical history significant of heart disease seen in ed for mechanical fall while moving furniture pt is to stop her eliquis and go to or on Saturday. Pt denies any other pain or concern. States that she was moving her chair because her cleaners were coming and she fell. She denies any dizziness/ chest pain palpitation. She lost her balance and hit her head and landed on her left side and came to er in ems for pain and not able to ambulate.  Clinical Impression  Patient is lying in bed and agrees to PT. Her strength is 2/5 LLE and 3/5 RLE.  She has pain in LLE 9/10 during mobility. She requires max assist with supine <> sit, and mod assist for sit to stand with RW and PWB LLE. She was able to side step x 4 steps and then was fatigued. She has good balance in standing and sitting. She is limited due to pain in LLE. She will benefit from skilled PT to improve mobility and strength.     Follow Up Recommendations SNF    Equipment Recommendations  Rolling walker with 5" wheels    Recommendations for Other Services       Precautions / Restrictions Precautions Precautions: Fall Restrictions Weight Bearing Restrictions: Yes LLE Weight Bearing: Partial weight bearing      Mobility  Bed Mobility Overal bed mobility: Needs Assistance Bed Mobility: Supine to Sit;Sit to Supine     Supine to sit: Mod assist Sit to supine: Mod assist   General bed mobility comments: needs VC for sequencing  Transfers Overall transfer level: Needs assistance Equipment used: Rolling walker (2 wheeled) Transfers: Sit to/from Stand Sit to Stand: Mod assist         General transfer comment: needs VC for sequencing  Ambulation/Gait Ambulation/Gait assistance: (unable)              Stairs             Wheelchair Mobility    Modified Rankin (Stroke Patients Only)       Balance Overall balance assessment: Mild deficits observed, not formally tested                                           Pertinent Vitals/Pain Pain Assessment: No/denies pain    Home Living Family/patient expects to be discharged to:: Skilled nursing facility                 Additional Comments: (Patient is from [place])    Prior Function Level of Independence: Independent               Hand Dominance   Dominant Hand: Right    Extremity/Trunk Assessment   Upper Extremity Assessment Upper Extremity Assessment: Overall WFL for tasks assessed    Lower Extremity Assessment Lower Extremity Assessment: LLE deficits/detail LLE Deficits / Details: (2/5 left hip flex, abd)       Communication   Communication: No difficulties  Cognition Arousal/Alertness: Awake/alert Behavior During Therapy: WFL for tasks assessed/performed Overall Cognitive Status: Within Functional Limits for tasks assessed  General Comments      Exercises     Assessment/Plan    PT Assessment Patient needs continued PT services  PT Problem List Decreased strength;Decreased activity tolerance;Decreased mobility;Decreased safety awareness       PT Treatment Interventions Gait training;Therapeutic activities;Therapeutic exercise;Balance training    PT Goals (Current goals can be found in the Care Plan section)  Acute Rehab PT Goals Patient Stated Goal: to walk better PT Goal Formulation: With patient Time For Goal Achievement: 06/26/19 Potential to Achieve Goals: Good    Frequency BID   Barriers to discharge        Co-evaluation               AM-PAC PT "6 Clicks" Mobility  Outcome Measure Help needed turning from your back to your side while in a flat bed without using bedrails?: Total Help needed moving  from lying on your back to sitting on the side of a flat bed without using bedrails?: Total Help needed moving to and from a bed to a chair (including a wheelchair)?: Total Help needed standing up from a chair using your arms (e.g., wheelchair or bedside chair)?: A Lot Help needed to walk in hospital room?: A Lot Help needed climbing 3-5 steps with a railing? : A Lot 6 Click Score: 9    End of Session Equipment Utilized During Treatment: Gait belt Activity Tolerance: Patient tolerated treatment well;Patient limited by pain   Nurse Communication: Mobility status PT Visit Diagnosis: Unsteadiness on feet (R26.81);Muscle weakness (generalized) (M62.81);Difficulty in walking, not elsewhere classified (R26.2)    Time: [id number] PT Time Calculation (min) (ACUTE ONLY): 38 min   Charges:   PT Evaluation $PT Eval Low Complexity: 1 Low PT Treatments $Therapeutic Activity: 23-37 mins         Alanson Puls, PT DPT 06/12/2019, 10:16 AM

## 2019-06-12 NOTE — Progress Notes (Signed)
PROGRESS NOTE    Kara [name]  [id number] DOB: Jul 19, 1922 DOA: 06/09/2019 PCP: Crecencio Mc, MD    Brief Narrative:  HPI: Kara [name] is a 86 y.o. female with medical history significant of heart disease seen in ed for mechanical fall while moving furniture pt is to stop her eliquis and go to or on Saturday. Pt denies any other pain or concern. States that she was moving her chair because her cleaners were coming and she fell. She denies any dizziness/ chest pain palpitation. She lost her balance and hit her head and landed on her left side and came to er in ems for pain and not able to ambulate.    Assessment & Plan:   Principal Problem:   Femur fracture, left (HCC) Active Problems:   Atrial fibrillation (HCC)   CAD (coronary artery disease)   Essential hypertension  Fracture of left femur Mechanical fall POD#1 status post operative fixation Pain well controlled at the moment PT saw patient recommended SNF Restart apixaban 2.5 mg twice daily   Atrial fibrillation Continue home Cardizem Decrease metoprolol as her blood pressure is on the low side to 12.5 mg daily Restart apixaban as above  Coronary artery disease HTN BP on the low side, continue Cardizem and Toprol  Hold Aldactone and Lasix    DVT prophylaxis: apixaban Code Status: Full Family Communication: none today Disposition Plan: We will need SNF   Consultants:   Orthopedic surgery  Procedures:   Intramedullary nail, left hip (06/11/19)  Antimicrobials:  Clindamycin    Subjective: Patient seen and examined this AM.  Denies any pain, shortness of breath or chest pain.  Objective: Vitals:   06/12/19 0405 06/12/19 0822 06/12/19 1027 06/12/19 1546  BP: 133/75 108/71 106/67 (!) 139/56  Pulse: 100 76 88 72  Resp: 18 16  16   Temp: 98.7 F (37.1 C) 97.8 F (36.6 C)  98.8 F (37.1 C)  TempSrc: Oral   Oral  SpO2: 95% 95%  96%  Weight:      Height:        Intake/Output Summary (Last 24  hours) at 06/12/2019 1716 Last data filed at 06/12/2019 1403 Gross per 24 hour  Intake 50 ml  Output 1550 ml  Net -1500 ml   Filed Weights   06/09/19 0936 06/10/19 0500 06/10/19 0612  Weight: 59 kg 63.7 kg 67.6 kg    Examination:  General exam: Appears calm and comfortable, NAD Respiratory system: Clear to auscultation. Respiratory effort normal. Cardiovascular system: S1 & S2 heard, RRR. No JVD, murmurs, rubs, gallops or clicks.  Gastrointestinal system: Abdomen is nondistended, soft and nontender.. Normal bowel sounds heard. Central nervous system: Awake, alert and oriented. No focal neurological deficits. Extremities: No edema Skin: Warm dry Psychiatry: Judgement and insight appear normal. Mood & affect appropriate.     Data Reviewed: I have personally reviewed following labs and imaging studies  CBC: Recent Labs  Lab 06/09/19 1115 06/11/19 0609 06/12/19 0603  WBC 10.6* 10.7* 10.8*  NEUTROABS 8.4*  --   --   HGB 13.5 12.5 10.7*  HCT 40.8 37.5 32.4*  MCV 93.8 93.1 95.0  PLT 167 155 0000000*   Basic Metabolic Panel: Recent Labs  Lab 06/09/19 1115 06/11/19 0609 06/12/19 0603  NA 138  --  132*  K 4.1  --  3.3*  CL 102  --  90*  CO2 28  --  32  GLUCOSE 188*  --  204*  BUN 20  --  13  CREATININE 0.87 0.77 0.82  CALCIUM 8.7*  --  8.0*   GFR: Estimated Creatinine Clearance: 38.7 mL/min (by C-G formula based on SCr of 0.82 mg/dL). Liver Function Tests: Recent Labs  Lab 06/09/19 1115  AST 27  ALT 19  ALKPHOS 62  BILITOT 1.6*  PROT 7.2  ALBUMIN 3.9   No results for input(s): LIPASE, AMYLASE in the last 168 hours. No results for input(s): AMMONIA in the last 168 hours. Coagulation Profile: Recent Labs  Lab 06/09/19 1115  INR 1.2   Cardiac Enzymes: No results for input(s): CKTOTAL, CKMB, CKMBINDEX, TROPONINI in the last 168 hours. BNP (last 3 results) No results for input(s): PROBNP in the last 8760 hours. HbA1C: No results for input(s): HGBA1C in the  last 72 hours. CBG: Recent Labs  Lab 06/11/19 0956  GLUCAP 172*   Lipid Profile: No results for input(s): CHOL, HDL, LDLCALC, TRIG, CHOLHDL, LDLDIRECT in the last 72 hours. Thyroid Function Tests: No results for input(s): TSH, T4TOTAL, FREET4, T3FREE, THYROIDAB in the last 72 hours. Anemia Panel: No results for input(s): VITAMINB12, FOLATE, FERRITIN, TIBC, IRON, RETICCTPCT in the last 72 hours. Sepsis Labs: No results for input(s): PROCALCITON, LATICACIDVEN in the last 168 hours.  Recent Results (from the past 240 hour(s))  Respiratory Panel by RT PCR (Flu A&B, Covid) - Nasopharyngeal Swab     Status: None   Collection Time: 06/09/19  9:39 AM   Specimen: Nasopharyngeal Swab  Result Value Ref Range Status   SARS Coronavirus 2 by RT PCR NEGATIVE NEGATIVE Final    Comment: (NOTE) SARS-CoV-2 target nucleic acids are NOT DETECTED. The SARS-CoV-2 RNA is generally detectable in upper respiratoy specimens during the acute phase of infection. The lowest concentration of SARS-CoV-2 viral copies this assay can detect is 131 copies/mL. A negative result does not preclude SARS-Cov-2 infection and should not be used as the sole basis for treatment or other patient management decisions. A negative result may occur with  improper specimen collection/handling, submission of specimen other than nasopharyngeal swab, presence of viral mutation(s) within the areas targeted by this assay, and inadequate number of viral copies (<131 copies/mL). A negative result must be combined with clinical observations, patient history, and epidemiological information. The expected result is Negative. Fact Sheet for Patients:  PinkCheek.be Fact Sheet for Healthcare Providers:  GravelBags.it This test is not yet ap proved or cleared by the [place] FDA and  has been authorized for detection and/or diagnosis of SARS-CoV-2 by FDA under an Emergency Use  Authorization (EUA). This EUA will remain  in effect (meaning this test can be used) for the duration of the COVID-19 declaration under Section 564(b)(1) of the Act, 21 U.S.C. section 360bbb-3(b)(1), unless the authorization is terminated or revoked sooner.    Influenza A by PCR NEGATIVE NEGATIVE Final   Influenza B by PCR NEGATIVE NEGATIVE Final    Comment: (NOTE) The Xpert Xpress SARS-CoV-2/FLU/RSV assay is intended as an aid in  the diagnosis of influenza from Nasopharyngeal swab specimens and  should not be used as a sole basis for treatment. Nasal washings and  aspirates are unacceptable for Xpert Xpress SARS-CoV-2/FLU/RSV  testing. Fact Sheet for Patients: PinkCheek.be Fact Sheet for Healthcare Providers: GravelBags.it This test is not yet approved or cleared by the [place] FDA and  has been authorized for detection and/or diagnosis of SARS-CoV-2 by  FDA under an Emergency Use Authorization (EUA). This EUA will remain  in effect (meaning this test can be used) for the  duration of the  Covid-19 declaration under Section 564(b)(1) of the Act, 21  U.S.C. section 360bbb-3(b)(1), unless the authorization is  terminated or revoked. Performed at [facility], [address] [place], [place] [address]   Surgical PCR screen     Status: None   Collection Time: 06/10/19  6:22 AM   Specimen: Nasal Mucosa; Nasal Swab  Result Value Ref Range Status   MRSA, PCR NEGATIVE NEGATIVE Final   Staphylococcus aureus NEGATIVE NEGATIVE Final    Comment: (NOTE) The Xpert SA Assay (FDA approved for NASAL specimens in patients 32 years of age and older), is one component of a comprehensive surveillance program. It is not intended to diagnose infection nor to guide or monitor treatment. Performed at [facility], [address] [place], [place] [address]          Radiology Studies: DG HIP OPERATIVE UNILAT W  OR W/O PELVIS LEFT  Result Date: 06/11/2019 CLINICAL DATA:  Left hip IM nail EXAM: OPERATIVE LEFT HIP (WITH PELVIS IF PERFORMED) 3 VIEWS TECHNIQUE: Fluoroscopic spot image(s) were submitted for interpretation post-operatively. FLUOROSCOPY TIME:  30 seconds COMPARISON:  Left hip radiographs dated 06/09/2019 FINDINGS: Intraoperative fluoroscopic radiographs during IM nail with hip screw and distal interlocking screw fixation of an intertrochanteric left hip fracture. Fracture fragments are in near anatomic alignment and position. IMPRESSION: Intraoperative fluoroscopic radiographs, as above. Electronically Signed   By: Julian Hy M.D.   On: 06/11/2019 11:28        Scheduled Meds: . apixaban  2.5 mg Oral BID  . diltiazem  180 mg Oral q morning - 10a  . feeding supplement (ENSURE ENLIVE)  237 mL Oral BID BM  . ferrous sulfate  325 mg Oral Q breakfast  . furosemide  20 mg Oral Daily  . metoprolol succinate  25 mg Oral QHS  . senna  1 tablet Oral BID  . spironolactone  25 mg Oral Daily   Continuous Infusions:    LOS: 3 days    Time spent: 45 minutes with more than 50% COC    Nolberto Hanlon, MD [facility] Pager [phone]  If 7PM-7AM, please contact night-coverage [email or web address] Password [facility] 06/12/2019, 5:16 PM  Patient ID: Kara [name], female   DOB: [birth year], 86 y.o.   MRN: [id number]

## 2019-06-13 ENCOUNTER — Encounter
Admission: RE | Admit: 2019-06-13 | Discharge: 2019-06-13 | Disposition: A | Discharge status: Home / Self Care | Payer: Medicare Other | Source: Ambulatory Visit | Attending: Internal Medicine | Admitting: Internal Medicine

## 2019-06-13 DIAGNOSIS — R509 Fever, unspecified: Secondary | ICD-10-CM

## 2019-06-13 LAB — BASIC METABOLIC PANEL
Anion gap: 8 (ref 5–15)
BUN: 21 mg/dL (ref 8–23)
CO2: 34 mmol/L — ABNORMAL HIGH (ref 22–32)
Calcium: 8.4 mg/dL — ABNORMAL LOW (ref 8.9–10.3)
Chloride: 94 mmol/L — ABNORMAL LOW (ref 98–111)
Creatinine, Ser: 0.85 mg/dL (ref 0.44–1.00)
GFR calc Af Amer: >60 mL/min (ref >60)
GFR calc non Af Amer: 59 mL/min — ABNORMAL LOW (ref >60)
Glucose, Bld: 180 mg/dL — ABNORMAL HIGH (ref 70–99)
Potassium: 4.1 mmol/L (ref 3.5–5.1)
Sodium: 136 mmol/L (ref 135–145)

## 2019-06-13 LAB — CBC
HCT: 31.5 % — ABNORMAL LOW (ref 36.0–46.0)
Hemoglobin: 10.4 g/dL — ABNORMAL LOW (ref 12.0–15.0)
MCH: 31.3 pg (ref 26.0–34.0)
MCHC: 33 g/dL (ref 30.0–36.0)
MCV: 94.9 fL (ref 80.0–100.0)
Platelets: 171 10*3/uL (ref 150–400)
RBC: 3.32 MIL/uL — ABNORMAL LOW (ref 3.87–5.11)
RDW: 12.9 % (ref 11.5–15.5)
WBC: 12.2 10*3/uL — ABNORMAL HIGH (ref 4.0–10.5)
nRBC: 0 % (ref 0.0–0.2)

## 2019-06-13 NOTE — Progress Notes (Addendum)
Physical Therapy Treatment Patient Details Name: Kara [name] MRN: [id number] DOB: 1922-09-26 Today's Date: 06/13/2019    History of Present Illness (P) Kara [name] a 86 y.o.femalewith medical history significant of heart disease seen in ed for mechanical fall while moving furniture pt is to stop her eliquis and go to or on Saturday. Pt denies any other pain or concern. States that she was moving her chair because her cleaners were coming and she fell. She denies any dizziness/ chest pain palpitation. She lost her balance and hit her head and landed on her left side and came to er in ems for pain and not able to ambulate.    PT Comments    Pt ready for session.  Gentle ROM prior to mobility.  To edge of bed with mod a x 1.  Once sitting, she is able to stand with mod a x 1.  She is able to take very small shuffling steps to recliner and required mod a x 1 to sit due to fear.  She is unable to take any true steps. She remained in recliner after session.    Follow Up Recommendations  SNF     Equipment Recommendations  Rolling walker with 5" wheels    Recommendations for Other Services       Precautions / Restrictions Restrictions Weight Bearing Restrictions: Yes LLE Weight Bearing: Partial weight bearing    Mobility  Bed Mobility Overal bed mobility: Needs Assistance Bed Mobility: Supine to Sit     Supine to sit: Mod assist     General bed mobility comments: needs VC for sequencing  Transfers Overall transfer level: Needs assistance Equipment used: Rolling walker (2 wheeled) Transfers: Sit to/from Stand Sit to Stand: Mod assist            Ambulation/Gait Ambulation/Gait assistance: Mod assist Gait Distance (Feet): 3 Feet Assistive device: Rolling walker (2 wheeled) Gait Pattern/deviations: Step-to pattern Gait velocity: decreased   General Gait Details: very small sliding steps towards recliner at bedside.  No true gait.   Stairs              Wheelchair Mobility    Modified Rankin (Stroke Patients Only)       Balance Overall balance assessment: Needs assistance Sitting-balance support: Feet supported Sitting balance-Leahy Scale: Fair     Standing balance support: Bilateral upper extremity supported Standing balance-Leahy Scale: Poor Standing balance comment: requires outside assist to prevent falls.                            Cognition Arousal/Alertness: Awake/alert Behavior During Therapy: WFL for tasks assessed/performed Overall Cognitive Status: Within Functional Limits for tasks assessed                                        Exercises Other Exercises Other Exercises: LLE AAROM ankle pumps, heel slides, ab/add and hip/knee flexion x 10 slow gentle ranges.    General Comments        Pertinent Vitals/Pain Pain Assessment: No/denies pain    Home Living                      Prior Function            PT Goals (current goals can now be found in the care plan section) Progress towards PT goals: Progressing  toward goals    Frequency    BID      PT Plan Current plan remains appropriate    Co-evaluation              AM-PAC PT "6 Clicks" Mobility   Outcome Measure  Help needed turning from your back to your side while in a flat bed without using bedrails?: A Lot Help needed moving from lying on your back to sitting on the side of a flat bed without using bedrails?: A Lot Help needed moving to and from a bed to a chair (including a wheelchair)?: A Lot Help needed standing up from a chair using your arms (e.g., wheelchair or bedside chair)?: A Lot Help needed to walk in hospital room?: A Lot Help needed climbing 3-5 steps with a railing? : Total 6 Click Score: 11    End of Session Equipment Utilized During Treatment: Gait belt Activity Tolerance: Patient tolerated treatment well;Patient limited by pain Patient left: in chair;with chair alarm  set;with call bell/phone within reach Nurse Communication: Mobility status       Time: [id number] PT Time Calculation (min) (ACUTE ONLY): 23 min  Charges:  $Therapeutic Exercise: 8-22 mins $Therapeutic Activity: 8-22 mins                    Chesley Noon, PTA 06/13/19, 11:51 AM

## 2019-06-13 NOTE — Progress Notes (Signed)
Physical Therapy Treatment Patient Details Name: Kara [name] MRN: [id number] DOB: [birth year] Today's Date: 06/13/2019    History of Present Illness 86 yo female with medical history of heat disease who fell moving her chair.  She denies dizziness, chest pain or palpitations.  She lost her balance and hit her head landing on her L side. Came to ER for pain and inability to ambulate.    PT Comments    Pt in recliner, ready to return to bed after almost 2 hours. Assisted with mod a x 1 for transfer and max a x 1 for return to supine.  Overall tolerated OOB well toay.  Some improved steps back to chair but remains limited to transfers only at this time.   Follow Up Recommendations  SNF     Equipment Recommendations  Rolling walker with 5" wheels    Recommendations for Other Services       Precautions / Restrictions Precautions Precautions: Fall Restrictions Weight Bearing Restrictions: Yes LLE Weight Bearing: Partial weight bearing    Mobility  Bed Mobility Overal bed mobility: Needs Assistance Bed Mobility: Sit to Supine     Supine to sit: Mod assist Sit to supine: Max assist   General bed mobility comments: needs VC for sequencing  Transfers Overall transfer level: Needs assistance Equipment used: Rolling walker (2 wheeled) Transfers: Sit to/from Stand Sit to Stand: Mod assist            Ambulation/Gait Ambulation/Gait assistance: Mod assist Gait Distance (Feet): 3 Feet Assistive device: Rolling walker (2 wheeled) Gait Pattern/deviations: Step-to pattern Gait velocity: decreased   General Gait Details: very slow small step but some improvement noted   Stairs             Wheelchair Mobility    Modified Rankin (Stroke Patients Only)       Balance Overall balance assessment: Needs assistance Sitting-balance support: Feet supported Sitting balance-Leahy Scale: Fair     Standing balance support: Bilateral upper extremity supported Standing  balance-Leahy Scale: Poor Standing balance comment: requires outside assist to prevent falls.                            Cognition Arousal/Alertness: Awake/alert Behavior During Therapy: WFL for tasks assessed/performed Overall Cognitive Status: Within Functional Limits for tasks assessed                                        Exercises Other Exercises Other Exercises: LLE AAROM ankle pumps, heel slides, ab/add and hip/knee flexion x 10 slow gentle ranges.    General Comments        Pertinent Vitals/Pain Pain Assessment: Faces Faces Pain Scale: Hurts even more Pain Location: L hip Pain Descriptors / Indicators: Aching;Guarding;Sore Pain Intervention(s): Limited activity within patient's tolerance;Monitored during session;Repositioned    Home Living                      Prior Function            PT Goals (current goals can now be found in the care plan section) Progress towards PT goals: Progressing toward goals    Frequency    BID      PT Plan Current plan remains appropriate    Co-evaluation              AM-PAC PT "6  Clicks" Mobility   Outcome Measure  Help needed turning from your back to your side while in a flat bed without using bedrails?: A Lot Help needed moving from lying on your back to sitting on the side of a flat bed without using bedrails?: A Lot Help needed moving to and from a bed to a chair (including a wheelchair)?: A Lot Help needed standing up from a chair using your arms (e.g., wheelchair or bedside chair)?: A Lot Help needed to walk in hospital room?: A Lot Help needed climbing 3-5 steps with a railing? : Total 6 Click Score: 11    End of Session Equipment Utilized During Treatment: Gait belt Activity Tolerance: Patient tolerated treatment well;Patient limited by pain Patient left: in bed;with bed alarm set;with call bell/phone within reach Nurse Communication: Mobility status       Time:  [id number] PT Time Calculation (min) (ACUTE ONLY): 13 min  Charges:  $Therapeutic Exercise: 8-22 mins $Therapeutic Activity: 8-22 mins                    Chesley Noon, PTA 06/13/19, 12:57 PM

## 2019-06-13 NOTE — TOC Initial Note (Signed)
Transition of Care [facility]) - Initial/Assessment Note    Patient Details  Name: Kara [name] MRN: [id number] Date of Birth: [birth year]  Transition of Care [facility]) CM/SW Contact:    Candie Chroman, LCSW Phone Number: 06/13/2019, 2:27 PM  Clinical Narrative: CSW met with patient. No supports at bedside. CSW introduced role and explained that PT recommendations would be discussed. Patient stated she is from [place] but is agreeable to going to their rehab side at discharge. They can take her as early as tomorrow as require a COVID test within 48 hours of discharge. Sent message to MD requesting order. No further concerns. CSW encouraged patient to contact CSW as needed. CSW will continue to follow patient for support and facilitate return to [place] when stable.                 Expected Discharge Plan: Skilled Nursing Facility Barriers to Discharge: Continued Medical Work up, Other (comment)(COVID test.)   Patient Goals and CMS Choice     Choice offered to / list presented to : NA  Expected Discharge Plan and Services Expected Discharge Plan: [place] Choice: [place] Living arrangements for the past 2 months: [place]                                      Prior Living Arrangements/Services Living arrangements for the past 2 months: [place] Lives with:: Self Patient language and need for interpreter reviewed:: Yes Do you feel safe going back to the place where you live?: Yes      Need for Family Participation in Patient Care: Yes (Comment) Care giver support system in place?: Yes (comment)   Criminal Activity/Legal Involvement Pertinent to Current Situation/Hospitalization: No - Comment as needed  Activities of Daily Living Home Assistive Devices/Equipment: None ADL Screening (condition at time of admission) Patient's cognitive ability adequate to safely  complete daily activities?: Yes Is the patient deaf or have difficulty hearing?: No Does the patient have difficulty seeing, even when wearing glasses/contacts?: No Does the patient have difficulty concentrating, remembering, or making decisions?: No Patient able to express need for assistance with ADLs?: Yes Does the patient have difficulty dressing or bathing?: No Independently performs ADLs?: Yes (appropriate for developmental age) Does the patient have difficulty walking or climbing stairs?: Yes Weakness of Legs: Left Weakness of Arms/Hands: None  Permission Sought/Granted Permission sought to share information with : Facility Art therapist granted to share information with : Yes, Verbal Permission Granted     Permission granted to share info w AGENCY: [place]        Emotional Assessment Appearance:: Appears stated age Attitude/Demeanor/Rapport: Engaged, Gracious Affect (typically observed): Accepting, Appropriate, Calm, Pleasant Orientation: : Oriented to Self, Oriented to Place, Oriented to  Time, Oriented to Situation Alcohol / Substance Use: Not Applicable Psych Involvement: No (comment)  Admission diagnosis:  Femur fracture, left (HCC) [S72.92XA] Injury of head, initial encounter [S09.90XA] Closed displaced intertrochanteric fracture of left femur, initial encounter [facility]) [S72.142A] Patient Active Problem List   Diagnosis Date Noted  . Femur fracture, left ([place]) 06/09/2019  . Vaginal itching 03/25/2019  . Sciatica of right side 02/18/2019  . Vaginitis and vulvovaginitis 12/26/2018  . Educated about COVID-19 virus infection 10/12/2018  . Edema of lower extremity 11/24/2017  . Exertional dyspnea 06/16/2017  .  Venous insufficiency of both lower extremities 11/24/2016  . Essential hypertension 11/11/2016  . Peripheral artery disease ([place]) 11/11/2016  . Chronic pulmonary hypertension ([place]) 10/20/2016  . Leg swelling 06/17/2016   . Type 2 diabetes mellitus with diabetic neuropathy, unspecified ([place]) 06/17/2016  . Urinary incontinence, urge 12/27/2015  . Atrophic vaginitis 09/26/2015  . Severe tricuspid valve insufficiency 07/04/2015  . Umbilical hernia without obstruction and without gangrene 06/26/2015  . Carpal tunnel syndrome of right wrist 05/29/2015  . Chronic fatigue 05/01/2015  . Left leg pain 05/01/2015  . Skin lesion 01/29/2015  . Vaginal atrophy 11/21/2014  . Urethral caruncle 11/21/2014  . Mitral valve regurgitation 05/09/2014  . History of pancreatitis 03/09/2014  . Fatigue 03/09/2014  . Other malaise and fatigue 03/09/2014  . Hypokalemia 04/12/2013  . Encounter for current long-term use of anticoagulants 10/06/2012  . Hemorrhoids 06/08/2012  . Osteoarthritis 03/08/2012  . Screening for breast cancer 03/08/2012  . Pernicious anemia 02/04/2012  . History of colectomy 02/04/2012  . Vitamin D deficiency 07/30/2011  . Anticoagulant long-term use 06/13/2011  . Atrial fibrillation ([place]) 06/13/2011  . CAD (coronary artery disease) 06/13/2011  . Chronic diarrhea 06/13/2011   PCP:  Crecencio Mc, MD Pharmacy:   [place], [place] [address] [place] [address] Phone: [phone] Fax: [place], [place] [address] Phone: [phone] Fax: [phone]     Social Determinants of Health (SDOH) Interventions    Readmission Risk Interventions No flowsheet data found.

## 2019-06-13 NOTE — Care Management Important Message (Signed)
Important Message  Patient Details  Name: Kara [name] MRN: [id number] Date of Birth: [birth year]   Medicare Important Message Given:  Yes     Dannette Barbara 06/13/2019, 11:19 AM

## 2019-06-13 NOTE — Progress Notes (Signed)
Subjective: 2 Days Post-Op Procedure(s) (LRB): INTRAMEDULLARY (IM) NAIL INTERTROCHANTRIC (Left)   Alert and cooperative.  Using incentive spirometry.  Out of bed in chair today.  Pain under control.  Patient reports pain as mild.  Objective:   VITALS:   Vitals:   06/13/19 0803 06/13/19 1553  BP: 109/74 (!) 116/46  Pulse: 84 62  Resp: 17 16  Temp: 98.6 F (37 C) 99.1 F (37.3 C)  SpO2: 94% 95%    Neurologically intact ABD soft Neurovascular intact Sensation intact distally Intact pulses distally Dorsiflexion/Plantar flexion intact Incision: no drainage  LABS Recent Labs    06/11/19 0609 06/12/19 0603 06/13/19 0525  HGB 12.5 10.7* 10.4*  HCT 37.5 32.4* 31.5*  WBC 10.7* 10.8* 12.2*  PLT 155 138* 171    Recent Labs    06/11/19 0609 06/12/19 0603 06/13/19 0525  NA  --  132* 136  K  --  3.3* 4.1  BUN  --  13 21  CREATININE 0.77 0.82 0.85  GLUCOSE  --  204* 180*    No results for input(s): LABPT, INR in the last 72 hours.   Assessment/Plan: 2 Days Post-Op Procedure(s) (LRB): INTRAMEDULLARY (IM) NAIL INTERTROCHANTRIC (Left)   Advance diet Up with therapy D/C IV fluids Discharge to SNF   Continue Eliquis for DVT prophylaxis  Return to clinic 2 weeks for reevaluation

## 2019-06-13 NOTE — Progress Notes (Signed)
PROGRESS NOTE    Kara [name]  [id number] DOB: [birth year] DOA: 06/09/2019 PCP: Crecencio Mc, MD    Brief Narrative:  HPI: Kara [name] is a 86 y.o. female with medical history significant of heart disease seen in ed for mechanical fall while moving furniture pt is to stop her eliquis and go to or on Saturday. Pt denies any other pain or concern. States that she was moving her chair because her cleaners were coming and she fell. She denies any dizziness/ chest pain palpitation. She lost her balance and hit her head and landed on her left side and came to er in ems for pain and not able to ambulate.    Assessment & Plan:   Principal Problem:   Femur fracture, left (HCC) Active Problems:   Atrial fibrillation (HCC)   CAD (coronary artery disease)   Essential hypertension  Fracture of left femur Mechanical fall POD#2 status post operative fixation Pain well controlled at the moment PT - recommended SNF-possible in am Will ck covid  Restarted on apixaban 2.5 mg twice daily    Fever- wbc increased.  Ck ucx, ua, bcx Will hold off on abx for now Incentive spirometer covid test for going to snf   Atrial fibrillation Continue home Cardizem Discontinue Toprol as BP is low Restarted apixaban as above  Coronary artery disease HTN BP on the low side, continue Cardizem  DC Toprol Hold Aldactone and Lasix    DVT prophylaxis: apixaban Code Status: Full Family Communication: none today Disposition Plan: Covid test ordered for SNF.  Needs to be afebrile overnight and if work-up is negative possible DC in a.m. if Covid negative   Consultants:   Orthopedic surgery  Procedures:   Intramedullary nail, left hip (06/11/19)  Antimicrobials:  Clindamycin    Subjective: Tmax 100.0. pt has no complaints of cp, sob, chills, or anything else  Objective: Vitals:   06/12/19 1027 06/12/19 1546 06/12/19 2029 06/13/19 0050  BP: 106/67 (!) 139/56 (!) 111/55 (!) 109/51    Pulse: 88 72 79 94  Resp:  16 16 17   Temp:  98.8 F (37.1 C) 100 F (37.8 C) 98.5 F (36.9 C)  TempSrc:  Oral Oral Oral  SpO2:  96% 96% 98%  Weight:      Height:        Intake/Output Summary (Last 24 hours) at 06/13/2019 0801 Last data filed at 06/13/2019 0500 Gross per 24 hour  Intake --  Output 950 ml  Net -950 ml   Filed Weights   06/09/19 0936 06/10/19 0500 06/10/19 0612  Weight: 59 kg 63.7 kg 67.6 kg    Examination:  General exam: Appears calm and comfortable, NAD Respiratory system: Clear to auscultation. Respiratory effort normal. Cardiovascular system: S1 & S2 heard, RRR. No  murmurs, rubs, gallops or clicks.  Gastrointestinal system: Abdomen is nondistended, soft and nontender.. Normal bowel sounds heard.no guarding Central nervous system: Awake, alert and oriented. No focal neurological deficits. Extremities: No edema/cyanosis Skin: Warm dry Psychiatry: Judgement and insight appear normal. Mood & affect appropriate.     Data Reviewed: I have personally reviewed following labs and imaging studies  CBC: Recent Labs  Lab 06/09/19 1115 06/11/19 0609 06/12/19 0603 06/13/19 0525  WBC 10.6* 10.7* 10.8* 12.2*  NEUTROABS 8.4*  --   --   --   HGB 13.5 12.5 10.7* 10.4*  HCT 40.8 37.5 32.4* 31.5*  MCV 93.8 93.1 95.0 94.9  PLT 167 155 138* [id number]   Basic Metabolic  Panel: Recent Labs  Lab 06/09/19 1115 06/11/19 0609 06/12/19 0603 06/13/19 0525  NA 138  --  132* 136  K 4.1  --  3.3* 4.1  CL 102  --  90* 94*  CO2 28  --  32 34*  GLUCOSE 188*  --  204* 180*  BUN 20  --  13 21  CREATININE 0.87 0.77 0.82 0.85  CALCIUM 8.7*  --  8.0* 8.4*   GFR: Estimated Creatinine Clearance: 37.4 mL/min (by C-G formula based on SCr of 0.85 mg/dL). Liver Function Tests: Recent Labs  Lab 06/09/19 1115  AST 27  ALT 19  ALKPHOS 62  BILITOT 1.6*  PROT 7.2  ALBUMIN 3.9   No results for input(s): LIPASE, AMYLASE in the last 168 hours. No results for input(s): AMMONIA  in the last 168 hours. Coagulation Profile: Recent Labs  Lab 06/09/19 1115  INR 1.2   Cardiac Enzymes: No results for input(s): CKTOTAL, CKMB, CKMBINDEX, TROPONINI in the last 168 hours. BNP (last 3 results) No results for input(s): PROBNP in the last 8760 hours. HbA1C: No results for input(s): HGBA1C in the last 72 hours. CBG: Recent Labs  Lab 06/11/19 0956  GLUCAP 172*   Lipid Profile: No results for input(s): CHOL, HDL, LDLCALC, TRIG, CHOLHDL, LDLDIRECT in the last 72 hours. Thyroid Function Tests: No results for input(s): TSH, T4TOTAL, FREET4, T3FREE, THYROIDAB in the last 72 hours. Anemia Panel: No results for input(s): VITAMINB12, FOLATE, FERRITIN, TIBC, IRON, RETICCTPCT in the last 72 hours. Sepsis Labs: No results for input(s): PROCALCITON, LATICACIDVEN in the last 168 hours.  Recent Results (from the past 240 hour(s))  Respiratory Panel by RT PCR (Flu A&B, Covid) - Nasopharyngeal Swab     Status: None   Collection Time: 06/09/19  9:39 AM   Specimen: Nasopharyngeal Swab  Result Value Ref Range Status   SARS Coronavirus 2 by RT PCR NEGATIVE NEGATIVE Final    Comment: (NOTE) SARS-CoV-2 target nucleic acids are NOT DETECTED. The SARS-CoV-2 RNA is generally detectable in upper respiratoy specimens during the acute phase of infection. The lowest concentration of SARS-CoV-2 viral copies this assay can detect is 131 copies/mL. A negative result does not preclude SARS-Cov-2 infection and should not be used as the sole basis for treatment or other patient management decisions. A negative result may occur with  improper specimen collection/handling, submission of specimen other than nasopharyngeal swab, presence of viral mutation(s) within the areas targeted by this assay, and inadequate number of viral copies (<131 copies/mL). A negative result must be combined with clinical observations, patient history, and epidemiological information. The expected result is  Negative. Fact Sheet for Patients:  PinkCheek.be Fact Sheet for Healthcare Providers:  GravelBags.it This test is not yet ap proved or cleared by the [place] FDA and  has been authorized for detection and/or diagnosis of SARS-CoV-2 by FDA under an Emergency Use Authorization (EUA). This EUA will remain  in effect (meaning this test can be used) for the duration of the COVID-19 declaration under Section 564(b)(1) of the Act, 21 U.S.C. section 360bbb-3(b)(1), unless the authorization is terminated or revoked sooner.    Influenza A by PCR NEGATIVE NEGATIVE Final   Influenza B by PCR NEGATIVE NEGATIVE Final    Comment: (NOTE) The Xpert Xpress SARS-CoV-2/FLU/RSV assay is intended as an aid in  the diagnosis of influenza from Nasopharyngeal swab specimens and  should not be used as a sole basis for treatment. Nasal washings and  aspirates are unacceptable for Xpert Xpress  SARS-CoV-2/FLU/RSV  testing. Fact Sheet for Patients: PinkCheek.be Fact Sheet for Healthcare Providers: GravelBags.it This test is not yet approved or cleared by the [place] FDA and  has been authorized for detection and/or diagnosis of SARS-CoV-2 by  FDA under an Emergency Use Authorization (EUA). This EUA will remain  in effect (meaning this test can be used) for the duration of the  Covid-19 declaration under Section 564(b)(1) of the Act, 21  U.S.C. section 360bbb-3(b)(1), unless the authorization is  terminated or revoked. Performed at [facility], [address], [place], [place] [address]   Surgical PCR screen     Status: None   Collection Time: 06/10/19  6:22 AM   Specimen: Nasal Mucosa; Nasal Swab  Result Value Ref Range Status   MRSA, PCR NEGATIVE NEGATIVE Final   Staphylococcus aureus NEGATIVE NEGATIVE Final    Comment: (NOTE) The Xpert SA Assay (FDA approved for NASAL  specimens in patients 36 years of age and older), is one component of a comprehensive surveillance program. It is not intended to diagnose infection nor to guide or monitor treatment. Performed at [facility], [address] [place], [place] [address]          Radiology Studies: DG HIP OPERATIVE UNILAT W OR W/O PELVIS LEFT  Result Date: 06/11/2019 CLINICAL DATA:  Left hip IM nail EXAM: OPERATIVE LEFT HIP (WITH PELVIS IF PERFORMED) 3 VIEWS TECHNIQUE: Fluoroscopic spot image(s) were submitted for interpretation post-operatively. FLUOROSCOPY TIME:  30 seconds COMPARISON:  Left hip radiographs dated 06/09/2019 FINDINGS: Intraoperative fluoroscopic radiographs during IM nail with hip screw and distal interlocking screw fixation of an intertrochanteric left hip fracture. Fracture fragments are in near anatomic alignment and position. IMPRESSION: Intraoperative fluoroscopic radiographs, as above. Electronically Signed   By: Julian Hy M.D.   On: 06/11/2019 11:28        Scheduled Meds: . apixaban  2.5 mg Oral BID  . diltiazem  180 mg Oral q morning - 10a  . feeding supplement (ENSURE ENLIVE)  237 mL Oral BID BM  . ferrous sulfate  325 mg Oral Q breakfast  . metoprolol succinate  12.5 mg Oral QHS  . potassium chloride  40 mEq Oral Daily  . senna  1 tablet Oral BID   Continuous Infusions:    LOS: 4 days    Time spent: 45 minutes with more than 50% COC    Nolberto Hanlon, MD [facility] Pager [phone]  If 7PM-7AM, please contact night-coverage [email or web address] Password [facility] 06/13/2019, 8:01 AM  Patient ID: DEVLYNN PEARL, female   DOB: 21-Jan-1923, 86 y.o.   MRN: [id number] Patient ID: KYNSIE GOERKE, female   DOB: 01/12/1923, 86 y.o.   MRN: [id number]

## 2019-06-14 ENCOUNTER — Ambulatory Visit: Payer: Medicare Other

## 2019-06-14 DIAGNOSIS — K529 Noninfective gastroenteritis and colitis, unspecified: Secondary | ICD-10-CM | POA: Diagnosis not present

## 2019-06-14 DIAGNOSIS — W19XXXA Unspecified fall, initial encounter: Secondary | ICD-10-CM | POA: Diagnosis not present

## 2019-06-14 DIAGNOSIS — E1140 Type 2 diabetes mellitus with diabetic neuropathy, unspecified: Secondary | ICD-10-CM | POA: Diagnosis not present

## 2019-06-14 DIAGNOSIS — I959 Hypotension, unspecified: Secondary | ICD-10-CM | POA: Diagnosis not present

## 2019-06-14 DIAGNOSIS — I272 Pulmonary hypertension, unspecified: Secondary | ICD-10-CM | POA: Diagnosis not present

## 2019-06-14 DIAGNOSIS — I739 Peripheral vascular disease, unspecified: Secondary | ICD-10-CM | POA: Diagnosis not present

## 2019-06-14 DIAGNOSIS — S72002A Fracture of unspecified part of neck of left femur, initial encounter for closed fracture: Secondary | ICD-10-CM | POA: Diagnosis not present

## 2019-06-14 DIAGNOSIS — M2550 Pain in unspecified joint: Secondary | ICD-10-CM | POA: Diagnosis not present

## 2019-06-14 DIAGNOSIS — Z7401 Bed confinement status: Secondary | ICD-10-CM | POA: Diagnosis not present

## 2019-06-14 DIAGNOSIS — I482 Chronic atrial fibrillation, unspecified: Secondary | ICD-10-CM | POA: Diagnosis not present

## 2019-06-14 DIAGNOSIS — M80052D Age-related osteoporosis with current pathological fracture, left femur, subsequent encounter for fracture with routine healing: Secondary | ICD-10-CM | POA: Diagnosis not present

## 2019-06-14 DIAGNOSIS — I495 Sick sinus syndrome: Secondary | ICD-10-CM | POA: Diagnosis not present

## 2019-06-14 DIAGNOSIS — S72142A Displaced intertrochanteric fracture of left femur, initial encounter for closed fracture: Secondary | ICD-10-CM | POA: Diagnosis not present

## 2019-06-14 DIAGNOSIS — I2510 Atherosclerotic heart disease of native coronary artery without angina pectoris: Secondary | ICD-10-CM | POA: Diagnosis not present

## 2019-06-14 DIAGNOSIS — I10 Essential (primary) hypertension: Secondary | ICD-10-CM | POA: Diagnosis not present

## 2019-06-14 DIAGNOSIS — S72142S Displaced intertrochanteric fracture of left femur, sequela: Secondary | ICD-10-CM | POA: Diagnosis not present

## 2019-06-14 DIAGNOSIS — I480 Paroxysmal atrial fibrillation: Secondary | ICD-10-CM | POA: Diagnosis not present

## 2019-06-14 DIAGNOSIS — M8000XD Age-related osteoporosis with current pathological fracture, unspecified site, subsequent encounter for fracture with routine healing: Secondary | ICD-10-CM | POA: Diagnosis not present

## 2019-06-14 DIAGNOSIS — S7292XA Unspecified fracture of left femur, initial encounter for closed fracture: Secondary | ICD-10-CM | POA: Diagnosis not present

## 2019-06-14 LAB — CBC
HCT: 29.9 % — ABNORMAL LOW (ref 36.0–46.0)
Hemoglobin: 9.9 g/dL — ABNORMAL LOW (ref 12.0–15.0)
MCH: 31.5 pg (ref 26.0–34.0)
MCHC: 33.1 g/dL (ref 30.0–36.0)
MCV: 95.2 fL (ref 80.0–100.0)
Platelets: 181 10*3/uL (ref 150–400)
RBC: 3.14 MIL/uL — ABNORMAL LOW (ref 3.87–5.11)
RDW: 12.9 % (ref 11.5–15.5)
WBC: 9.5 10*3/uL (ref 4.0–10.5)
nRBC: 0 % (ref 0.0–0.2)

## 2019-06-14 LAB — SARS CORONAVIRUS 2 (TAT 6-24 HRS): SARS Coronavirus 2: NEGATIVE

## 2019-06-14 MED ORDER — FERROUS SULFATE 325 (65 FE) MG PO TABS: 325.0000 mg | ORAL_TABLET | Freq: Every day | ORAL | 3 refills | Status: DC

## 2019-06-14 MED ORDER — ENSURE ENLIVE PO LIQD: 237.0000 mL | Freq: Two times a day (BID) | ORAL | 12 refills | Status: DC

## 2019-06-14 MED ORDER — HYDROCODONE-ACETAMINOPHEN 7.5-325 MG PO TABS: 1.0000 | ORAL_TABLET | Freq: Four times a day (QID) | ORAL | 0 refills | Status: DC | PRN

## 2019-06-14 MED ORDER — SENNA 8.6 MG PO TABS: 1.0000 | ORAL_TABLET | Freq: Two times a day (BID) | ORAL | 0 refills | Status: DC

## 2019-06-14 MED ORDER — ACETAMINOPHEN 325 MG PO TABS: 650.0000 mg | ORAL_TABLET | Freq: Four times a day (QID) | ORAL | Status: DC | PRN

## 2019-06-14 NOTE — Progress Notes (Signed)
Report called and given to Bet, RN at [facility].

## 2019-06-14 NOTE — Progress Notes (Signed)
Subjective: 3 Days Post-Op Procedure(s) (LRB): INTRAMEDULLARY (IM) NAIL INTERTROCHANTRIC (Left)   Doing well today.  Comfortable.  Working with PT.  Being transported to skilled nursing this afternoon.  Hemoglobin stable at 9.9.  Patient reports pain as mild.  Objective:   VITALS:   Vitals:   06/14/19 0755 06/14/19 1616  BP: 122/63 (!) 120/44  Pulse: 87 72  Resp: 18   Temp:  98.7 F (37.1 C)  SpO2: 94% 99%    Neurologically intact ABD soft Neurovascular intact Sensation intact distally Intact pulses distally Dorsiflexion/Plantar flexion intact Incision: no drainage  LABS Recent Labs    06/12/19 0603 06/13/19 0525 06/14/19 0447  HGB 10.7* 10.4* 9.9*  HCT 32.4* 31.5* 29.9*  WBC 10.8* 12.2* 9.5  PLT 138* 171 181    Recent Labs    06/12/19 0603 06/13/19 0525  NA 132* 136  K 3.3* 4.1  BUN 13 21  CREATININE 0.82 0.85  GLUCOSE 204* 180*    No results for input(s): LABPT, INR in the last 72 hours.   Assessment/Plan: 3 Days Post-Op Procedure(s) (LRB): INTRAMEDULLARY (IM) NAIL INTERTROCHANTRIC (Left)   Advance diet Up with therapy Discharge to SNF   Return to clinic in 2 weeks. Eliquis for postop DVT prophylaxis

## 2019-06-14 NOTE — Progress Notes (Signed)
Dressing changed per verbal order

## 2019-06-14 NOTE — TOC Progression Note (Addendum)
Transition of Care [facility]) - Progression Note    Patient Details  Name: FLOELLA BROMAN MRN: [id number] Date of Birth: 02/22/1923  [facility]) CM/SW Contact  Candie Chroman, LCSW Phone Number: 06/14/2019, 9:17 AM  Clinical Narrative:  COVID test came back negative. Left voicemail for [facility] admissions coordinator to confirm plans for discharge today.   9:25 am: Received call back from admissions coordinator who confirmed everything is ready for her to admit there today. Sent message to MD to notify.  Expected Discharge Plan: [place] Barriers to Discharge: Continued Medical Work up, Other (comment)(COVID test.)  Expected Discharge Plan and Services Expected Discharge Plan: [place] Choice: [place] arrangements for the past 2 months: [place]                                       Social Determinants of Health (SDOH) Interventions    Readmission Risk Interventions No flowsheet data found.

## 2019-06-14 NOTE — TOC Transition Note (Signed)
Transition of Care [facility]) - CM/SW Discharge Note   Patient Details  Name: Kara [name] MRN: [id number] Date of Birth: 05-24-1922  [facility] of Care [facility]) CM/SW Contact:  Candie Chroman, LCSW Phone Number: 06/14/2019, 11:17 AM   Clinical Narrative:  Patient has orders to discharge to [facility] on the SNF/rehab side today. CSW offered to notify family but patient said she would tell her daughter when she calls. RN will call report to [phone] (Room 349) prior to setting up EMS transport. No further concerns. CSW signing off.   Final next level of care: Skilled Nursing Facility Barriers to Discharge: Barriers Resolved   Patient Goals and CMS Choice     Choice offered to / list presented to : NA  Discharge Placement   Existing PASRR number confirmed : 06/10/19          Patient chooses bed at: [facility] Patient to be transferred to facility by: EMS Name of family member notified: Patient will notify her daughter when she calls. Patient and family notified of of transfer: 06/14/19  Discharge Plan and Services     Post Acute Care Choice: [place]                               Social Determinants of Health (SDOH) Interventions     Readmission Risk Interventions No flowsheet data found.

## 2019-06-14 NOTE — Progress Notes (Signed)
Physical Therapy Treatment Patient Details Name: Kara [name] MRN: [id number] DOB: 1922/08/24 Today's Date: 06/14/2019    History of Present Illness 86 yo female with medical history of heat disease who fell moving her chair.  Came to ER for pain and inability to ambulate, L hip fx with IM nailing 1/23.    PT Comments    Pt eager and willing to participate with PT initially, but ultimately was quite limited with most aspects of PT session.  She was able to do many reps/sets of exercises but needed AAROM with all tasks that were against gravity of required more than very minimal movement.  She was unable to get L hip moving in bed and needed considerable assist to get to sitting EOB and ultimately continued to be very limited with tolerance to standing/ambulation showing very little tolerance for even PWBing on the L when it was time to advance/move R LE.  Pt apparently was walking quite a bit w/o AD just a week ago, but is very limited at this time.  Likely pt to d/c to rehab this afternoon.    Follow Up Recommendations  SNF     Equipment Recommendations  Rolling walker with 5" wheels    Recommendations for Other Services       Precautions / Restrictions Precautions Precautions: Fall Restrictions Weight Bearing Restrictions: Yes LLE Weight Bearing: Partial weight bearing    Mobility  Bed Mobility Overal bed mobility: Needs Assistance Bed Mobility: Supine to Sit     Supine to sit: Mod assist     General bed mobility comments: Pt was able to minimally bridge/scoot hips, moved R LE toward EOB, unable to do any AROM in L to get toward EOB  Transfers Overall transfer level: Needs assistance Equipment used: Rolling walker (2 wheeled) Transfers: Sit to/from Stand Sit to Stand: Mod assist         General transfer comment: Pt needing a lot of cuing, reinforcement and encourgement as well as phyiscal assist to attain standing despite plenty of cuing and assist with set  up  Ambulation/Gait Ambulation/Gait assistance: Mod assist Gait Distance (Feet): 4 Feet Assistive device: Rolling walker (2 wheeled)       General Gait Details: Pt continued to do very poorly with "ambulation."  She is extremely hesitant to put weight through L LE (re-educated on PBWing and implications).  She needed direct PT assist to [facility] during L stance phase trying to advance R, heavily reliant on the walker and requesting to sit almost right away.  After to manage a few small, unsteady steps with chair following but needing constant assist and unsafe t/o the session.     Stairs             Wheelchair Mobility    Modified Rankin (Stroke Patients Only)       Balance Overall balance assessment: Needs assistance Sitting-balance support: Feet supported Sitting balance-Leahy Scale: Fair     Standing balance support: Bilateral upper extremity supported Standing balance-Leahy Scale: Poor Standing balance comment: requires outside assist to prevent falls.                            Cognition Arousal/Alertness: Awake/alert Behavior During Therapy: WFL for tasks assessed/performed Overall Cognitive Status: Within Functional Limits for tasks assessed  Exercises General Exercises - Lower Extremity Ankle Circles/Pumps: AROM;10 reps Short Arc Quad: AAROM;10 reps Heel Slides: AAROM;10 reps(with lightly resisted leg extensions) Hip ABduction/ADduction: AAROM;10 reps Straight Leg Raises: AAROM;5 reps    General Comments        Pertinent Vitals/Pain Pain Assessment: 0-10 Pain Score: 6  Pain Location: L hip    Home Living                      Prior Function            PT Goals (current goals can now be found in the care plan section) Progress towards PT goals: Progressing toward goals    Frequency    BID      PT Plan Current plan remains appropriate    Co-evaluation               AM-PAC PT "6 Clicks" Mobility   Outcome Measure  Help needed turning from your back to your side while in a flat bed without using bedrails?: A Lot Help needed moving from lying on your back to sitting on the side of a flat bed without using bedrails?: A Lot Help needed moving to and from a bed to a chair (including a wheelchair)?: A Lot Help needed standing up from a chair using your arms (e.g., wheelchair or bedside chair)?: A Lot Help needed to walk in hospital room?: Total Help needed climbing 3-5 steps with a railing? : Total 6 Click Score: 10    End of Session Equipment Utilized During Treatment: Gait belt Activity Tolerance: Patient tolerated treatment well;Patient limited by pain Patient left: with call bell/phone within reach;with chair alarm set Nurse Communication: Mobility status PT Visit Diagnosis: Unsteadiness on feet (R26.81);Muscle weakness (generalized) (M62.81);Difficulty in walking, not elsewhere classified (R26.2)     Time: [id number] PT Time Calculation (min) (ACUTE ONLY): 40 min  Charges:  $Gait Training: 8-22 mins $Therapeutic Exercise: 8-22 mins $Therapeutic Activity: 8-22 mins                     Kreg Shropshire, DPT 06/14/2019, 10:55 AM

## 2019-06-14 NOTE — Discharge Summary (Signed)
Kara [name] [id number] DOB: 08/30/1922 DOA: 06/09/2019  PCP: Crecencio Mc, MD  Admit date: 06/09/2019 Discharge date: 06/14/2019  Admitted From: Independent living Disposition: SNF  Recommendations for Outpatient Follow-up:  1. Follow up with PCP in 1 week 2. Please obtain BMP/CBC in one week 3. Follow-up with Dr. Earnestine Leys orthopedic surgery within 2 weeks  Home Health: None   Discharge Condition:Stable CODE STATUS: Full Diet recommendation: Heart Healthy  Brief/Interim Summary: Kara [name] is a 86 y.o. female with medical history significant of heart disease seen in ed for mechanical fall while moving furniture pt is to stop her eliquis and go to or on Saturday. Pt denies any other pain or concern. States that she was moving her chair because her cleaners were coming and she fell. She lost her balance and hit her head and landed on her left side and came to er in ems for pain and not able to ambulate.  She was found with Comminuted, impacted intertrochanteric fracture of the proximal left femur with varus deformity.  CT of the head was negative.  Orthopedic surgery was consulted.  She underwent Trochanteric fixation nailing of the left hip.  Her Eliquis was held for surgery and resumed.  PT OT follow the patient.  They recommended SNF which obviously she is going back to were receiving physical therapy over there.  We had a Covid prior to her transferring back to SNF which was negative.  She is stable to be discharged.  Some of her blood pressure medications were held and she will need to follow-up with her primary care for further adjustments.  Discharge Diagnoses:  Principal Problem:   Femur fracture, left ([place]) Active Problems:   Atrial fibrillation (HCC)   CAD (coronary artery disease)   Essential hypertension    Discharge Instructions  Discharge Instructions    Call MD for:  temperature >100.4   Complete by: As directed    Diet - low sodium heart healthy    Complete by: As directed    Increase activity slowly   Complete by: As directed    Physical therapy and Occupational Therapy assessment and plan at SNF     Allergies as of 06/14/2019      Reactions   Baycol [cerivastatin Sodium]    Cardizem [diltiazem Hcl]    LOWER EXTREMITY EDEMA   Crestor [rosuvastatin Calcium] Other (See Comments)   INTOLERANT   Dronedarone Other (See Comments)   Fatigue   Metoprolol Tartrate Other (See Comments)   Omeprazole Other (See Comments)   Pravachol    INTOLERANT   Statins Other (See Comments)   Vioxx [rofecoxib]    Zocor [simvastatin]    INTOLERANT      Medication List    STOP taking these medications   furosemide 20 MG tablet Commonly known as: LASIX   metoprolol succinate 25 MG 24 hr tablet Commonly known as: TOPROL-XL   potassium chloride 10 MEQ tablet Commonly known as: KLOR-CON   spironolactone 25 MG tablet Commonly known as: ALDACTONE     TAKE these medications   acetaminophen 325 MG tablet Commonly known as: TYLENOL Take 2 tablets (650 mg total) by mouth every 6 (six) hours as needed for mild pain or moderate pain (pain score 1-3 or temp > 100.5).   apixaban 2.5 MG Tabs tablet Commonly known as: ELIQUIS Take 2.5 mg by mouth 2 (two) times daily.   cholecalciferol 1000 units tablet Commonly known as: VITAMIN D Take 1,000 Units by mouth daily.  Cranberry 200 MG Caps Take 1 capsule (200 mg total) by mouth daily.   cyanocobalamin 1000 MCG/ML injection Commonly known as: (VITAMIN B-12) INJECT 1ML INTO THE MUSCLE EVERY 14 DAYS   diltiazem 180 MG 24 hr capsule Commonly known as: CARDIZEM CD TAKE 1 CAPSULE BY MOUTH EVERY MORNING   diphenoxylate-atropine 2.5-0.025 MG tablet Commonly known as: LOMOTIL TAKE ONE TABLET BY MOUTH TWICE A DAY AS NEEDED FOR DIARRHEA   feeding supplement (ENSURE ENLIVE) Liqd Take 237 mLs by mouth 2 (two) times daily between meals.   ferrous sulfate 325 (65 FE) MG tablet Take 1 tablet (325 mg  total) by mouth daily with breakfast. Start taking on: June 15, 2019   HYDROcodone-acetaminophen 7.5-325 MG tablet Commonly known as: NORCO Take 1 tablet by mouth every 6 (six) hours as needed for severe pain (pain score 7-10).   senna 8.6 MG Tabs tablet Commonly known as: SENOKOT Take 1 tablet (8.6 mg total) by mouth 2 (two) times daily.   triamcinolone cream 0.1 % Commonly known as: KENALOG Apply 1 application topically 2 (two) times daily. To vulva until itching resolves,  Then reduce use to twice weekly What changed:   when to take this  additional instructions       Contact information for follow-up providers    Earnestine Leys, MD. Schedule an appointment as soon as possible for a visit in 2 week(s).   Specialty: Orthopedic Surgery Why: Please make appointment to for the patient is discharged. Contact information: [place] [address] [phone]        Crecencio Mc, MD Follow up in 1 week(s).   Specialty: Internal Medicine Contact information: [place] [address] [phone]            Contact information for after-discharge care    Destination    [facility] .   Service: Skilled Nursing Contact information: [place] [phone]                 Allergies  Allergen Reactions  . Baycol [Cerivastatin Sodium]   . Cardizem [Diltiazem Hcl]     LOWER EXTREMITY EDEMA  . Crestor [Rosuvastatin Calcium] Other (See Comments)    INTOLERANT  . Dronedarone Other (See Comments)    Fatigue  . Metoprolol Tartrate Other (See Comments)  . Omeprazole Other (See Comments)  . Pravachol     INTOLERANT  . Statins Other (See Comments)  . Vioxx [Rofecoxib]   . Zocor [Simvastatin]     INTOLERANT    Consultations:  Orthopedic surgery   Procedures/Studies: DG Chest 1 View  Result Date: 06/09/2019 CLINICAL DATA:  [place]. Acute  left hip pain. EXAM: CHEST  1 VIEW COMPARISON:  Chest x-ray 01/14/2012 FINDINGS: Right atrial and right ventricular pacer wires are in good position. No complicating features. The heart is normal in size for age. Mild tortuosity and calcification of the thoracic aorta is noted. The lungs are clear. The bony structures appear intact. Probable remote healed left humeral neck fracture. IMPRESSION: No acute cardiopulmonary findings. Electronically Signed   By: Marijo Sanes M.D.   On: 06/09/2019 10:56   CT Head Wo Contrast  Result Date: 06/09/2019 CLINICAL DATA:  Fall, hit head EXAM: CT HEAD WITHOUT CONTRAST TECHNIQUE: Contiguous axial images were obtained from the base of the skull through the vertex without intravenous contrast. COMPARISON:  2016 FINDINGS: Brain: There is no acute intracranial hemorrhage, mass-effect, or edema. Gray-white  differentiation is preserved. There is no extra-axial fluid collection. Ventricles and sulci are within normal limits in size and configuration. Patchy hypoattenuation in the supratentorial white matter is nonspecific but may reflect mild to moderate chronic microvascular ischemic changes. Vascular: There is atherosclerotic calcification at the skull base. Skull: Calvarium is unremarkable. Sinuses/Orbits: No acute finding. Other: None. IMPRESSION: No evidence of acute intracranial injury. Electronically Signed   By: Macy Mis M.D.   On: 06/09/2019 10:41   DG HIP OPERATIVE UNILAT W OR W/O PELVIS LEFT  Result Date: 06/11/2019 CLINICAL DATA:  Left hip IM nail EXAM: OPERATIVE LEFT HIP (WITH PELVIS IF PERFORMED) 3 VIEWS TECHNIQUE: Fluoroscopic spot image(s) were submitted for interpretation post-operatively. FLUOROSCOPY TIME:  30 seconds COMPARISON:  Left hip radiographs dated 06/09/2019 FINDINGS: Intraoperative fluoroscopic radiographs during IM nail with hip screw and distal interlocking screw fixation of an intertrochanteric left hip fracture. Fracture fragments are in  near anatomic alignment and position. IMPRESSION: Intraoperative fluoroscopic radiographs, as above. Electronically Signed   By: Julian Hy M.D.   On: 06/11/2019 11:28   DG Hip Unilat W or Wo Pelvis 2-3 Views Left  Result Date: 06/09/2019 CLINICAL DATA:  Fall, acute left hip pain EXAM: DG HIP (WITH OR WITHOUT PELVIS) 2-3V LEFT COMPARISON:  CT abdomen and pelvis from 2013 FINDINGS: Comminuted intertrochanteric fracture of the proximal left femur with marked Verus angulation and impaction with superior migration of the distal portion of the femur relative to femoral head and neck. Osteopenia. Signs of injection granulomata over the iliac crest on the right. IMPRESSION: Comminuted, impacted intertrochanteric fracture of the proximal left femur with varus deformity. Electronically Signed   By: Zetta Bills M.D.   On: 06/09/2019 10:55       Subjective: Patient is feeling better.  Has no shortness of breath, chest pain, pain tolerated with pain meds.  Discharge Exam: Vitals:   06/14/19 0004 06/14/19 0755  BP: 127/62 122/63  Pulse: 84 87  Resp: 16 18  Temp: 98.2 F (36.8 C)   SpO2: 95% 94%   Vitals:   06/13/19 0803 06/13/19 1553 06/14/19 0004 06/14/19 0755  BP: 109/74 (!) 116/46 127/62 122/63  Pulse: 84 62 84 87  Resp: 17 16 16 18   Temp: 98.6 F (37 C) 99.1 F (37.3 C) 98.2 F (36.8 C)   TempSrc: Oral Oral Oral   SpO2: 94% 95% 95% 94%  Weight:      Height:        General: Pt is alert, awake, not in acute distress Cardiovascular: RRR, S1/S2 +, no rubs, no gallops Respiratory: CTA bilaterally, no wheezing, no rhonchi Abdominal: Soft, NT, ND, bowel sounds + Extremities: no edema, no cyanosis    The results of significant diagnostics from this hospitalization (including imaging, microbiology, ancillary and laboratory) are listed below for reference.     Microbiology: Recent Results (from the past 240 hour(s))  Respiratory Panel by RT PCR (Flu A&B, Covid) -  Nasopharyngeal Swab     Status: None   Collection Time: 06/09/19  9:39 AM   Specimen: Nasopharyngeal Swab  Result Value Ref Range Status   SARS Coronavirus 2 by RT PCR NEGATIVE NEGATIVE Final    Comment: (NOTE) SARS-CoV-2 target nucleic acids are NOT DETECTED. The SARS-CoV-2 RNA is generally detectable in upper respiratoy specimens during the acute phase of infection. The lowest concentration of SARS-CoV-2 viral copies this assay can detect is 131 copies/mL. A negative result does not preclude SARS-Cov-2 infection and should not be used as  the sole basis for treatment or other patient management decisions. A negative result may occur with  improper specimen collection/handling, submission of specimen other than nasopharyngeal swab, presence of viral mutation(s) within the areas targeted by this assay, and inadequate number of viral copies (<131 copies/mL). A negative result must be combined with clinical observations, patient history, and epidemiological information. The expected result is Negative. Fact Sheet for Patients:  PinkCheek.be Fact Sheet for Healthcare Providers:  GravelBags.it This test is not yet ap proved or cleared by the [place] FDA and  has been authorized for detection and/or diagnosis of SARS-CoV-2 by FDA under an Emergency Use Authorization (EUA). This EUA will remain  in effect (meaning this test can be used) for the duration of the COVID-19 declaration under Section 564(b)(1) of the Act, 21 U.S.C. section 360bbb-3(b)(1), unless the authorization is terminated or revoked sooner.    Influenza A by PCR NEGATIVE NEGATIVE Final   Influenza B by PCR NEGATIVE NEGATIVE Final    Comment: (NOTE) The Xpert Xpress SARS-CoV-2/FLU/RSV assay is intended as an aid in  the diagnosis of influenza from Nasopharyngeal swab specimens and  should not be used as a sole basis for treatment. Nasal washings and  aspirates  are unacceptable for Xpert Xpress SARS-CoV-2/FLU/RSV  testing. Fact Sheet for Patients: PinkCheek.be Fact Sheet for Healthcare Providers: GravelBags.it This test is not yet approved or cleared by the [place] FDA and  has been authorized for detection and/or diagnosis of SARS-CoV-2 by  FDA under an Emergency Use Authorization (EUA). This EUA will remain  in effect (meaning this test can be used) for the duration of the  Covid-19 declaration under Section 564(b)(1) of the Act, 21  U.S.C. section 360bbb-3(b)(1), unless the authorization is  terminated or revoked. Performed at [facility], [address], [place], [place] [address]   Surgical PCR screen     Status: None   Collection Time: 06/10/19  6:22 AM   Specimen: Nasal Mucosa; Nasal Swab  Result Value Ref Range Status   MRSA, PCR NEGATIVE NEGATIVE Final   Staphylococcus aureus NEGATIVE NEGATIVE Final    Comment: (NOTE) The Xpert SA Assay (FDA approved for NASAL specimens in patients 17 years of age and older), is one component of a comprehensive surveillance program. It is not intended to diagnose infection nor to guide or monitor treatment. Performed at [facility], [place], [place] [address]   SARS CORONAVIRUS 2 (TAT 6-24 HRS) Nasopharyngeal Nasopharyngeal Swab     Status: None   Collection Time: 06/13/19  4:02 PM   Specimen: Nasopharyngeal Swab  Result Value Ref Range Status   SARS Coronavirus 2 NEGATIVE NEGATIVE Final    Comment: (NOTE) SARS-CoV-2 target nucleic acids are NOT DETECTED. The SARS-CoV-2 RNA is generally detectable in upper and lower respiratory specimens during the acute phase of infection. Negative results do not preclude SARS-CoV-2 infection, do not rule out co-infections with other pathogens, and should not be used as the sole basis for treatment or other patient management decisions. Negative results  must be combined with clinical observations, patient history, and epidemiological information. The expected result is Negative. Fact Sheet for Patients: SugarRoll.be Fact Sheet for Healthcare Providers: https://www.woods-mathews.com/ This test is not yet approved or cleared by the [place] FDA and  has been authorized for detection and/or diagnosis of SARS-CoV-2 by FDA under an Emergency Use Authorization (EUA). This EUA will remain  in effect (meaning this test can be used) for the duration of  the COVID-19 declaration under Section 56 4(b)(1) of the Act, 21 U.S.C. section 360bbb-3(b)(1), unless the authorization is terminated or revoked sooner. Performed at [facility], [place] [address], [place], [place] [address]   CULTURE, BLOOD (ROUTINE X 2) w Reflex to ID Panel     Status: None (Preliminary result)   Collection Time: 06/13/19  4:27 PM   Specimen: BLOOD  Result Value Ref Range Status   Specimen Description BLOOD BLOOD LEFT HAND  Final   Special Requests   Final    BOTTLES DRAWN AEROBIC AND ANAEROBIC Blood Culture adequate volume   Culture   Final    NO GROWTH < 24 HOURS Performed at [facility], [address], [place], [place] [address]    Report Status PENDING  Incomplete  CULTURE, BLOOD (ROUTINE X 2) w Reflex to ID Panel     Status: None (Preliminary result)   Collection Time: 06/13/19  4:27 PM   Specimen: BLOOD  Result Value Ref Range Status   Specimen Description BLOOD BLOOD RIGHT HAND  Final   Special Requests   Final    BOTTLES DRAWN AEROBIC AND ANAEROBIC Blood Culture adequate volume   Culture   Final    NO GROWTH < 24 HOURS Performed at [facility], [address] [place], [place] [address]    Report Status PENDING  Incomplete     Labs: BNP (last 3 results) No results for input(s): BNP in the last 8760 hours. Basic Metabolic Panel: Recent Labs  Lab 06/09/19 1115 06/11/19 0609  06/12/19 0603 06/13/19 0525  NA 138  --  132* 136  K 4.1  --  3.3* 4.1  CL 102  --  90* 94*  CO2 28  --  32 34*  GLUCOSE 188*  --  204* 180*  BUN 20  --  13 21  CREATININE 0.87 0.77 0.82 0.85  CALCIUM 8.7*  --  8.0* 8.4*   Liver Function Tests: Recent Labs  Lab 06/09/19 1115  AST 27  ALT 19  ALKPHOS 62  BILITOT 1.6*  PROT 7.2  ALBUMIN 3.9   No results for input(s): LIPASE, AMYLASE in the last 168 hours. No results for input(s): AMMONIA in the last 168 hours. CBC: Recent Labs  Lab 06/09/19 1115 06/11/19 0609 06/12/19 0603 06/13/19 0525 06/14/19 0447  WBC 10.6* 10.7* 10.8* 12.2* 9.5  NEUTROABS 8.4*  --   --   --   --   HGB 13.5 12.5 10.7* 10.4* 9.9*  HCT 40.8 37.5 32.4* 31.5* 29.9*  MCV 93.8 93.1 95.0 94.9 95.2  PLT 167 155 138* 171 181   Cardiac Enzymes: No results for input(s): CKTOTAL, CKMB, CKMBINDEX, TROPONINI in the last 168 hours. BNP: Invalid input(s): POCBNP CBG: Recent Labs  Lab 06/11/19 0956  GLUCAP 172*   D-Dimer No results for input(s): DDIMER in the last 72 hours. Hgb A1c No results for input(s): HGBA1C in the last 72 hours. Lipid Profile No results for input(s): CHOL, HDL, LDLCALC, TRIG, CHOLHDL, LDLDIRECT in the last 72 hours. Thyroid function studies No results for input(s): TSH, T4TOTAL, T3FREE, THYROIDAB in the last 72 hours.  Invalid input(s): FREET3 Anemia work up No results for input(s): VITAMINB12, FOLATE, FERRITIN, TIBC, IRON, RETICCTPCT in the last 72 hours. Urinalysis    Component Value Date/Time   COLORURINE YELLOW (A) 06/09/2019 1117   APPEARANCEUR CLEAR (A) 06/09/2019 1117   APPEARANCEUR Clear 05/27/2019 1002   LABSPEC 1.010 06/09/2019 1117   LABSPEC 1.016 12/26/2011 1844  PHURINE 6.0 06/09/2019 1117   GLUCOSEU NEGATIVE 06/09/2019 1117   GLUCOSEU NEGATIVE 12/10/2018 1020   HGBUR NEGATIVE 06/09/2019 [place] 06/09/2019 1117   BILIRUBINUR Negative 05/27/2019 1002   BILIRUBINUR Negative 12/26/2011  1844   KETONESUR 5 (A) 06/09/2019 1117   PROTEINUR NEGATIVE 06/09/2019 1117   UROBILINOGEN 0.2 12/10/2018 1020   NITRITE NEGATIVE 06/09/2019 1117   LEUKOCYTESUR NEGATIVE 06/09/2019 1117   LEUKOCYTESUR 1+ 12/26/2011 1844   Sepsis Labs Invalid input(s): PROCALCITONIN,  WBC,  LACTICIDVEN Microbiology Recent Results (from the past 240 hour(s))  Respiratory Panel by RT PCR (Flu A&B, Covid) - Nasopharyngeal Swab     Status: None   Collection Time: 06/09/19  9:39 AM   Specimen: Nasopharyngeal Swab  Result Value Ref Range Status   SARS Coronavirus 2 by RT PCR NEGATIVE NEGATIVE Final    Comment: (NOTE) SARS-CoV-2 target nucleic acids are NOT DETECTED. The SARS-CoV-2 RNA is generally detectable in upper respiratoy specimens during the acute phase of infection. The lowest concentration of SARS-CoV-2 viral copies this assay can detect is 131 copies/mL. A negative result does not preclude SARS-Cov-2 infection and should not be used as the sole basis for treatment or other patient management decisions. A negative result may occur with  improper specimen collection/handling, submission of specimen other than nasopharyngeal swab, presence of viral mutation(s) within the areas targeted by this assay, and inadequate number of viral copies (<131 copies/mL). A negative result must be combined with clinical observations, patient history, and epidemiological information. The expected result is Negative. Fact Sheet for Patients:  PinkCheek.be Fact Sheet for Healthcare Providers:  GravelBags.it This test is not yet ap proved or cleared by the [place] FDA and  has been authorized for detection and/or diagnosis of SARS-CoV-2 by FDA under an Emergency Use Authorization (EUA). This EUA will remain  in effect (meaning this test can be used) for the duration of the COVID-19 declaration under Section 564(b)(1) of the Act, 21 U.S.C. section  360bbb-3(b)(1), unless the authorization is terminated or revoked sooner.    Influenza A by PCR NEGATIVE NEGATIVE Final   Influenza B by PCR NEGATIVE NEGATIVE Final    Comment: (NOTE) The Xpert Xpress SARS-CoV-2/FLU/RSV assay is intended as an aid in  the diagnosis of influenza from Nasopharyngeal swab specimens and  should not be used as a sole basis for treatment. Nasal washings and  aspirates are unacceptable for Xpert Xpress SARS-CoV-2/FLU/RSV  testing. Fact Sheet for Patients: PinkCheek.be Fact Sheet for Healthcare Providers: GravelBags.it This test is not yet approved or cleared by the [place] FDA and  has been authorized for detection and/or diagnosis of SARS-CoV-2 by  FDA under an Emergency Use Authorization (EUA). This EUA will remain  in effect (meaning this test can be used) for the duration of the  Covid-19 declaration under Section 564(b)(1) of the Act, 21  U.S.C. section 360bbb-3(b)(1), unless the authorization is  terminated or revoked. Performed at [facility], [address], [place], [place] [address]   Surgical PCR screen     Status: None   Collection Time: 06/10/19  6:22 AM   Specimen: Nasal Mucosa; Nasal Swab  Result Value Ref Range Status   MRSA, PCR NEGATIVE NEGATIVE Final   Staphylococcus aureus NEGATIVE NEGATIVE Final    Comment: (NOTE) The Xpert SA Assay (FDA approved for NASAL specimens in patients 58 years of age and older), is one component of a comprehensive surveillance program. It is not intended to diagnose infection nor  to guide or monitor treatment. Performed at [facility], [place], [place] [address]   SARS CORONAVIRUS 2 (TAT 6-24 HRS) Nasopharyngeal Nasopharyngeal Swab     Status: None   Collection Time: 06/13/19  4:02 PM   Specimen: Nasopharyngeal Swab  Result Value Ref Range Status   SARS Coronavirus 2 NEGATIVE NEGATIVE Final     Comment: (NOTE) SARS-CoV-2 target nucleic acids are NOT DETECTED. The SARS-CoV-2 RNA is generally detectable in upper and lower respiratory specimens during the acute phase of infection. Negative results do not preclude SARS-CoV-2 infection, do not rule out co-infections with other pathogens, and should not be used as the sole basis for treatment or other patient management decisions. Negative results must be combined with clinical observations, patient history, and epidemiological information. The expected result is Negative. Fact Sheet for Patients: SugarRoll.be Fact Sheet for Healthcare Providers: https://www.woods-mathews.com/ This test is not yet approved or cleared by the [place] FDA and  has been authorized for detection and/or diagnosis of SARS-CoV-2 by FDA under an Emergency Use Authorization (EUA). This EUA will remain  in effect (meaning this test can be used) for the duration of the COVID-19 declaration under Section 56 4(b)(1) of the Act, 21 U.S.C. section 360bbb-3(b)(1), unless the authorization is terminated or revoked sooner. Performed at [facility], [place] [address], [place], [place] [address]   CULTURE, BLOOD (ROUTINE X 2) w Reflex to ID Panel     Status: None (Preliminary result)   Collection Time: 06/13/19  4:27 PM   Specimen: BLOOD  Result Value Ref Range Status   Specimen Description BLOOD BLOOD LEFT HAND  Final   Special Requests   Final    BOTTLES DRAWN AEROBIC AND ANAEROBIC Blood Culture adequate volume   Culture   Final    NO GROWTH < 24 HOURS Performed at [facility], [address], [place], [place] [address]    Report Status PENDING  Incomplete  CULTURE, BLOOD (ROUTINE X 2) w Reflex to ID Panel     Status: None (Preliminary result)   Collection Time: 06/13/19  4:27 PM   Specimen: BLOOD  Result Value Ref Range Status   Specimen Description BLOOD BLOOD RIGHT HAND  Final   Special  Requests   Final    BOTTLES DRAWN AEROBIC AND ANAEROBIC Blood Culture adequate volume   Culture   Final    NO GROWTH < 24 HOURS Performed at [facility], [address], [place], [place] [address]    Report Status PENDING  Incomplete     Time coordinating discharge: Over 30 minutes  SIGNED:   Nolberto Hanlon, MD  [facility] 06/14/2019, 10:19 AM Pager   If 7PM-7AM, please contact night-coverage [email or web address] Password [email or web address]

## 2019-06-14 NOTE — Progress Notes (Signed)
Pt discharged to [facility] this afternoon via EMS.  All belongings were packed up and given to EMS to take with pt.  Skin warm dry clean and intact.  Incision to L hip was assessed with Dr. Sabra Heck in room at time of discharge surgical dressing was removed and aquacel was placed.  Staples to incision were intact no reddness, swelling or drainage noted to site.  Pt tolerated procedure well.  IV was removed at time of discharge.

## 2019-06-15 DIAGNOSIS — I2510 Atherosclerotic heart disease of native coronary artery without angina pectoris: Secondary | ICD-10-CM | POA: Diagnosis not present

## 2019-06-15 DIAGNOSIS — I272 Pulmonary hypertension, unspecified: Secondary | ICD-10-CM | POA: Diagnosis not present

## 2019-06-15 DIAGNOSIS — E1140 Type 2 diabetes mellitus with diabetic neuropathy, unspecified: Secondary | ICD-10-CM | POA: Diagnosis not present

## 2019-06-15 DIAGNOSIS — I495 Sick sinus syndrome: Secondary | ICD-10-CM | POA: Diagnosis not present

## 2019-06-15 DIAGNOSIS — I480 Paroxysmal atrial fibrillation: Secondary | ICD-10-CM | POA: Diagnosis not present

## 2019-06-15 DIAGNOSIS — I739 Peripheral vascular disease, unspecified: Secondary | ICD-10-CM | POA: Diagnosis not present

## 2019-06-15 DIAGNOSIS — M8000XD Age-related osteoporosis with current pathological fracture, unspecified site, subsequent encounter for fracture with routine healing: Secondary | ICD-10-CM | POA: Insufficient documentation

## 2019-06-16 ENCOUNTER — Telehealth: Payer: Self-pay

## 2019-06-16 NOTE — Telephone Encounter (Signed)
Failed attempt to reach patient for transition of care. Hospital follow up via telephone is scheduled 06/24/19 @ 4:00.

## 2019-06-17 NOTE — Telephone Encounter (Signed)
Second failed attempt to reach patient for transition of care.  Keep scheduled hospital follow up scheduled via telephone with PMD.

## 2019-06-18 LAB — CULTURE, BLOOD (ROUTINE X 2)
Culture: NO GROWTH
Culture: NO GROWTH
Special Requests: ADEQUATE
Special Requests: ADEQUATE

## 2019-06-24 ENCOUNTER — Telehealth: Payer: Self-pay | Admitting: Urology

## 2019-06-24 ENCOUNTER — Ambulatory Visit (INDEPENDENT_AMBULATORY_CARE_PROVIDER_SITE_OTHER): Payer: Medicare Other | Admitting: Internal Medicine

## 2019-06-24 ENCOUNTER — Other Ambulatory Visit: Payer: Self-pay

## 2019-06-24 ENCOUNTER — Encounter: Payer: Self-pay | Admitting: Internal Medicine

## 2019-06-24 DIAGNOSIS — K529 Noninfective gastroenteritis and colitis, unspecified: Secondary | ICD-10-CM | POA: Diagnosis not present

## 2019-06-24 DIAGNOSIS — S72142S Displaced intertrochanteric fracture of left femur, sequela: Secondary | ICD-10-CM | POA: Diagnosis not present

## 2019-06-24 NOTE — Telephone Encounter (Signed)
Pt. Called to let Dr. Bernardo Heater know she did not miss her RUS appt. On purpose. She fell and broke her hip and had surgery on 06/11/19 pt will be in Physical Therapy for a while. Pt. Called from her rehab facility to let us know she will call and reschedule as soon as she is able.

## 2019-06-24 NOTE — Progress Notes (Signed)
Telephone  Note  This visit type was conducted due to national recommendations for restrictions regarding the COVID-19 pandemic (e.g. social distancing).  This format is felt to be most appropriate for this patient at this time.  All issues noted in this document were discussed and addressed.  No physical exam was performed (except for noted visual exam findings with Video Visits).   I connected with@ on 06/24/19 at  4:00 PM EST by  telephone and verified that I am speaking with the correct person using two identifiers. Location patient: home Location provider: work or home office Persons participating in the virtual visit: patient, provider  I discussed the limitations, risks, security and privacy concerns of performing an evaluation and management service by telephone and the availability of in person appointments. I also discussed with the patient that there may be a patient responsible charge related to this service. The patient expressed understanding and agreed to proceed.  Reason for visit:follow up  HPI:  Patient had a mechanical  fall on jan 21 at home and  Fractured left hip and hit her head.  She was evaluated in ER, Head CT negative for bleed , left hip fractured.  admitted to [facility] on Jan 21 with a  Comminuted impacted intertrochanteric fracture of the proximal left femur and underwent  for trochanteric fixation  and was discharged to SNF. On Jan 26 for rehab.  She is currently In [place] at [facility] .    Appetite good,  Denies pain, Her chronic diarrhea and fecal urgency has been managed with Imodium.  Feels her brain is foggy , however since the surgery, and thinks she will probably need an aide when she is released . She is progressing slowly and using a walker currently.   ROS: See pertinent positives and negatives per HPI.  Past Medical History:  Diagnosis Date  . Allergy   . Atrial fibrillation ([place])   . CAD (coronary artery disease)   . Cancer (HCC)    UTERINE   . Chronic anticoagulation   . Chronic diarrhea   . Decubitus ulcer    sacral region  . Diabetes ([place])    diet controlled  . Dysuria   . Edema of lower extremity    mainly right foot, slightly in left foot.  . Fibrocystic breast disease   . GERD (gastroesophageal reflux disease)   . Glaucoma   . Glaucoma   . Hematuria   . Hemorrhoids   . History of colon polyps   . History of pancreatitis   . Hyperlipidemia   . Hypertension   . Hypokalemia   . IBS (irritable bowel syndrome)   . Microscopic hematuria   . Mitral valve regurgitation   . Osteoarthritis    fingers  . Pernicious anemia   . Plantar fasciitis   . Recurrent UTI   . Skin cancer   . Sleep apnea, obstructive   . Vaginal atrophy   . Vitamin D deficiency   . Yeast vaginitis     Past Surgical History:  Procedure Laterality Date  . ABDOMINAL HYSTERECTOMY  1980's  . ABDOMINAL SURGERY     for villous polyp,,,many years ago  . APPENDECTOMY  1940's  . ASCAD, s/p PTCA  11/28/2005   MID LESION   . BREAST BIOPSY Left 1970's  . CARPAL TUNNEL RELEASE Right 12/13/2015   Procedure: CARPAL TUNNEL RELEASE;  Surgeon: Thornton Park, MD;  Location: [facility] ORS;  Service: Orthopedics;  Laterality: Right;  . COLECTOMY  2015  .  INTRAMEDULLARY (IM) NAIL INTERTROCHANTERIC Left 06/11/2019   Procedure: INTRAMEDULLARY (IM) NAIL INTERTROCHANTRIC;  Surgeon: Earnestine Leys, MD;  Location: [facility] ORS;  Service: Orthopedics;  Laterality: Left;  . PACEMAKER PLACEMENT    . radation     for uterine cance  . REFRACTIVE SURGERY     for bilateral glaumoma  . TONSILLECTOMY  1936    Family History  Problem Relation Age of Onset  . Coronary artery disease Father   . Kidney disease Father   . Cancer Sister   . Diabetes Other   . Cancer Mother        COLON  . Bladder Cancer Neg Hx   . Breast cancer Neg Hx     SOCIAL HX:  reports that she has quit smoking. She has never used smokeless tobacco. She reports current alcohol use. She reports that  she does not use drugs.   Current Outpatient Medications:  .  acetaminophen (TYLENOL) 325 MG tablet, Take 2 tablets (650 mg total) by mouth every 6 (six) hours as needed for mild pain or moderate pain (pain score 1-3 or temp > 100.5)., Disp: , Rfl:  .  apixaban (ELIQUIS) 2.5 MG TABS tablet, Take 2.5 mg by mouth 2 (two) times daily., Disp: , Rfl:  .  cholecalciferol (VITAMIN D) 1000 UNITS tablet, Take 1,000 Units by mouth daily., Disp: , Rfl:  .  Cranberry 200 MG CAPS, Take 1 capsule (200 mg total) by mouth daily., Disp: 30 capsule, Rfl: 2 .  cyanocobalamin (,VITAMIN B-12,) 1000 MCG/ML injection, INJECT 1ML INTO THE MUSCLE EVERY 14 DAYS, Disp: 10 mL, Rfl: 0 .  diltiazem (CARDIZEM CD) 180 MG 24 hr capsule, TAKE 1 CAPSULE BY MOUTH EVERY MORNING, Disp: 90 capsule, Rfl: 1 .  diphenoxylate-atropine (LOMOTIL) 2.5-0.025 MG tablet, TAKE ONE TABLET BY MOUTH TWICE A DAY AS NEEDED FOR DIARRHEA, Disp: 60 tablet, Rfl: 3 .  feeding supplement, ENSURE ENLIVE, (ENSURE ENLIVE) LIQD, Take 237 mLs by mouth 2 (two) times daily between meals., Disp: 237 mL, Rfl: 12 .  ferrous sulfate 325 (65 FE) MG tablet, Take 1 tablet (325 mg total) by mouth daily with breakfast., Disp: 30 tablet, Rfl: 3 .  HYDROcodone-acetaminophen (NORCO) 7.5-325 MG tablet, Take 1 tablet by mouth every 6 (six) hours as needed for severe pain (pain score 7-10)., Disp: 12 tablet, Rfl: 0 .  senna (SENOKOT) 8.6 MG TABS tablet, Take 1 tablet (8.6 mg total) by mouth 2 (two) times daily., Disp: 120 tablet, Rfl: 0 .  triamcinolone cream (KENALOG) 0.1 %, Apply 1 application topically 2 (two) times daily. To vulva until itching resolves,  Then reduce use to twice weekly (Patient taking differently: Apply 1 application topically 2 (two) times a week. ), Disp: 30 g, Rfl: 0  EXAM:   General impression: alert, cooperative and articulate.  No signs of being in distress  Lungs: speech is fluent sentence length suggests that patient is not short of breath and not  punctuated by cough, sneezing or sniffing. Marland Kitchen   Psych: affect normal.  speech is articulate and non pressured .   Neuro: Neuro:  awake and interactive with normal mood and affect. Higher cortical functions are normal. Speech is clear without word-finding difficulty or dysarthria.   ASSESSMENT AND PLAN:  Discussed the following assessment and plan:  Chronic diarrhea  Displaced intertrochanteric fracture of left femur, sequela  Chronic diarrhea Chronic, secondary to history of small and large bowel resection in 2013 for recurrent  radiation enteritis induced SBO . Managed during  rehab with IModium   Displaced intertrochanteric fracture of left femur, sequela S/p trochanteric fixation nailing by Earnestine Leys Jun 11 2019 . [facility] at this time. And plans to return to independent living     I discussed the assessment and treatment plan with the patient. The patient was provided an opportunity to ask questions and all were answered. The patient agreed with the plan and demonstrated an understanding of the instructions.   The patient was advised to call back or seek an in-person evaluation if the symptoms worsen or if the condition fails to improve as anticipated.  I provided  25 minutes of non-face-to-face time during this encounter reviewing patient's current problems and past procedures/imaging studies, providing counseling on the above mentioned problems , and coordination  of care .  Crecencio Mc, MD

## 2019-06-25 DIAGNOSIS — S72142S Displaced intertrochanteric fracture of left femur, sequela: Secondary | ICD-10-CM | POA: Insufficient documentation

## 2019-06-25 NOTE — Assessment & Plan Note (Signed)
S/p trochanteric fixation nailing by Earnestine Leys Jun 11 2019 . She remains in [facility] for [facility] at this time. And plans to return to independent living

## 2019-06-25 NOTE — Assessment & Plan Note (Addendum)
Chronic, secondary to history of small and large bowel resection in 2013 for recurrent  radiation enteritis induced SBO . Managed during rehab with IModium

## 2019-06-27 DIAGNOSIS — S72009A Fracture of unspecified part of neck of unspecified femur, initial encounter for closed fracture: Secondary | ICD-10-CM | POA: Insufficient documentation

## 2019-06-27 DIAGNOSIS — S72002A Fracture of unspecified part of neck of left femur, initial encounter for closed fracture: Secondary | ICD-10-CM | POA: Diagnosis not present

## 2019-06-27 DIAGNOSIS — S72142A Displaced intertrochanteric fracture of left femur, initial encounter for closed fracture: Secondary | ICD-10-CM | POA: Diagnosis not present

## 2019-07-06 DIAGNOSIS — I480 Paroxysmal atrial fibrillation: Secondary | ICD-10-CM | POA: Diagnosis not present

## 2019-07-06 DIAGNOSIS — E1140 Type 2 diabetes mellitus with diabetic neuropathy, unspecified: Secondary | ICD-10-CM | POA: Diagnosis not present

## 2019-07-25 DIAGNOSIS — S72002A Fracture of unspecified part of neck of left femur, initial encounter for closed fracture: Secondary | ICD-10-CM | POA: Diagnosis not present

## 2019-07-29 DIAGNOSIS — Z9181 History of falling: Secondary | ICD-10-CM | POA: Diagnosis not present

## 2019-07-29 DIAGNOSIS — M25552 Pain in left hip: Secondary | ICD-10-CM | POA: Diagnosis not present

## 2019-07-29 DIAGNOSIS — R2689 Other abnormalities of gait and mobility: Secondary | ICD-10-CM | POA: Diagnosis not present

## 2019-07-29 DIAGNOSIS — S72142A Displaced intertrochanteric fracture of left femur, initial encounter for closed fracture: Secondary | ICD-10-CM | POA: Diagnosis not present

## 2019-07-29 DIAGNOSIS — M6281 Muscle weakness (generalized): Secondary | ICD-10-CM | POA: Diagnosis not present

## 2019-07-29 DIAGNOSIS — M80052D Age-related osteoporosis with current pathological fracture, left femur, subsequent encounter for fracture with routine healing: Secondary | ICD-10-CM | POA: Diagnosis not present

## 2019-07-29 DIAGNOSIS — R262 Difficulty in walking, not elsewhere classified: Secondary | ICD-10-CM | POA: Diagnosis not present

## 2019-08-01 DIAGNOSIS — R2689 Other abnormalities of gait and mobility: Secondary | ICD-10-CM | POA: Diagnosis not present

## 2019-08-01 DIAGNOSIS — M80052D Age-related osteoporosis with current pathological fracture, left femur, subsequent encounter for fracture with routine healing: Secondary | ICD-10-CM | POA: Diagnosis not present

## 2019-08-01 DIAGNOSIS — M6281 Muscle weakness (generalized): Secondary | ICD-10-CM | POA: Diagnosis not present

## 2019-08-01 DIAGNOSIS — M25552 Pain in left hip: Secondary | ICD-10-CM | POA: Diagnosis not present

## 2019-08-01 DIAGNOSIS — Z9181 History of falling: Secondary | ICD-10-CM | POA: Diagnosis not present

## 2019-08-01 DIAGNOSIS — S72142A Displaced intertrochanteric fracture of left femur, initial encounter for closed fracture: Secondary | ICD-10-CM | POA: Diagnosis not present

## 2019-08-09 DIAGNOSIS — M6281 Muscle weakness (generalized): Secondary | ICD-10-CM | POA: Diagnosis not present

## 2019-08-09 DIAGNOSIS — S72142A Displaced intertrochanteric fracture of left femur, initial encounter for closed fracture: Secondary | ICD-10-CM | POA: Diagnosis not present

## 2019-08-09 DIAGNOSIS — R41841 Cognitive communication deficit: Secondary | ICD-10-CM | POA: Diagnosis not present

## 2019-08-09 DIAGNOSIS — R2689 Other abnormalities of gait and mobility: Secondary | ICD-10-CM | POA: Diagnosis not present

## 2019-08-09 DIAGNOSIS — Z9181 History of falling: Secondary | ICD-10-CM | POA: Diagnosis not present

## 2019-08-09 DIAGNOSIS — M25552 Pain in left hip: Secondary | ICD-10-CM | POA: Diagnosis not present

## 2019-08-09 DIAGNOSIS — I10 Essential (primary) hypertension: Secondary | ICD-10-CM | POA: Diagnosis not present

## 2019-08-09 DIAGNOSIS — E119 Type 2 diabetes mellitus without complications: Secondary | ICD-10-CM | POA: Diagnosis not present

## 2019-08-09 DIAGNOSIS — S72142D Displaced intertrochanteric fracture of left femur, subsequent encounter for closed fracture with routine healing: Secondary | ICD-10-CM | POA: Diagnosis not present

## 2019-08-11 DIAGNOSIS — M25552 Pain in left hip: Secondary | ICD-10-CM | POA: Diagnosis not present

## 2019-08-11 DIAGNOSIS — S72142A Displaced intertrochanteric fracture of left femur, initial encounter for closed fracture: Secondary | ICD-10-CM | POA: Diagnosis not present

## 2019-08-11 DIAGNOSIS — I10 Essential (primary) hypertension: Secondary | ICD-10-CM | POA: Diagnosis not present

## 2019-08-11 DIAGNOSIS — Z9181 History of falling: Secondary | ICD-10-CM | POA: Diagnosis not present

## 2019-08-11 DIAGNOSIS — E119 Type 2 diabetes mellitus without complications: Secondary | ICD-10-CM | POA: Diagnosis not present

## 2019-08-11 DIAGNOSIS — S72142D Displaced intertrochanteric fracture of left femur, subsequent encounter for closed fracture with routine healing: Secondary | ICD-10-CM | POA: Diagnosis not present

## 2019-08-12 DIAGNOSIS — I4819 Other persistent atrial fibrillation: Secondary | ICD-10-CM | POA: Insufficient documentation

## 2019-08-12 DIAGNOSIS — I480 Paroxysmal atrial fibrillation: Secondary | ICD-10-CM | POA: Diagnosis not present

## 2019-08-12 DIAGNOSIS — I10 Essential (primary) hypertension: Secondary | ICD-10-CM | POA: Diagnosis not present

## 2019-08-12 DIAGNOSIS — E782 Mixed hyperlipidemia: Secondary | ICD-10-CM | POA: Diagnosis not present

## 2019-08-12 DIAGNOSIS — I2510 Atherosclerotic heart disease of native coronary artery without angina pectoris: Secondary | ICD-10-CM | POA: Diagnosis not present

## 2019-08-16 DIAGNOSIS — S72142D Displaced intertrochanteric fracture of left femur, subsequent encounter for closed fracture with routine healing: Secondary | ICD-10-CM | POA: Diagnosis not present

## 2019-08-16 DIAGNOSIS — S72142A Displaced intertrochanteric fracture of left femur, initial encounter for closed fracture: Secondary | ICD-10-CM | POA: Diagnosis not present

## 2019-08-16 DIAGNOSIS — M25552 Pain in left hip: Secondary | ICD-10-CM | POA: Diagnosis not present

## 2019-08-16 DIAGNOSIS — I10 Essential (primary) hypertension: Secondary | ICD-10-CM | POA: Diagnosis not present

## 2019-08-16 DIAGNOSIS — E119 Type 2 diabetes mellitus without complications: Secondary | ICD-10-CM | POA: Diagnosis not present

## 2019-08-16 DIAGNOSIS — Z9181 History of falling: Secondary | ICD-10-CM | POA: Diagnosis not present

## 2019-08-18 DIAGNOSIS — Z9181 History of falling: Secondary | ICD-10-CM | POA: Diagnosis not present

## 2019-08-18 DIAGNOSIS — M6281 Muscle weakness (generalized): Secondary | ICD-10-CM | POA: Diagnosis not present

## 2019-08-18 DIAGNOSIS — S72142A Displaced intertrochanteric fracture of left femur, initial encounter for closed fracture: Secondary | ICD-10-CM | POA: Diagnosis not present

## 2019-08-18 DIAGNOSIS — R41841 Cognitive communication deficit: Secondary | ICD-10-CM | POA: Diagnosis not present

## 2019-08-18 DIAGNOSIS — S72142D Displaced intertrochanteric fracture of left femur, subsequent encounter for closed fracture with routine healing: Secondary | ICD-10-CM | POA: Diagnosis not present

## 2019-08-18 DIAGNOSIS — I10 Essential (primary) hypertension: Secondary | ICD-10-CM | POA: Diagnosis not present

## 2019-08-18 DIAGNOSIS — R2689 Other abnormalities of gait and mobility: Secondary | ICD-10-CM | POA: Diagnosis not present

## 2019-08-18 DIAGNOSIS — M25552 Pain in left hip: Secondary | ICD-10-CM | POA: Diagnosis not present

## 2019-08-18 DIAGNOSIS — E119 Type 2 diabetes mellitus without complications: Secondary | ICD-10-CM | POA: Diagnosis not present

## 2019-08-23 DIAGNOSIS — I10 Essential (primary) hypertension: Secondary | ICD-10-CM | POA: Diagnosis not present

## 2019-08-23 DIAGNOSIS — M25552 Pain in left hip: Secondary | ICD-10-CM | POA: Diagnosis not present

## 2019-08-23 DIAGNOSIS — E119 Type 2 diabetes mellitus without complications: Secondary | ICD-10-CM | POA: Diagnosis not present

## 2019-08-23 DIAGNOSIS — S72142A Displaced intertrochanteric fracture of left femur, initial encounter for closed fracture: Secondary | ICD-10-CM | POA: Diagnosis not present

## 2019-08-23 DIAGNOSIS — S72142D Displaced intertrochanteric fracture of left femur, subsequent encounter for closed fracture with routine healing: Secondary | ICD-10-CM | POA: Diagnosis not present

## 2019-08-23 DIAGNOSIS — Z9181 History of falling: Secondary | ICD-10-CM | POA: Diagnosis not present

## 2019-08-26 DIAGNOSIS — I4819 Other persistent atrial fibrillation: Secondary | ICD-10-CM | POA: Diagnosis not present

## 2019-08-30 DIAGNOSIS — M25552 Pain in left hip: Secondary | ICD-10-CM | POA: Diagnosis not present

## 2019-08-30 DIAGNOSIS — Z9181 History of falling: Secondary | ICD-10-CM | POA: Diagnosis not present

## 2019-08-30 DIAGNOSIS — S72142D Displaced intertrochanteric fracture of left femur, subsequent encounter for closed fracture with routine healing: Secondary | ICD-10-CM | POA: Diagnosis not present

## 2019-08-30 DIAGNOSIS — S72142A Displaced intertrochanteric fracture of left femur, initial encounter for closed fracture: Secondary | ICD-10-CM | POA: Diagnosis not present

## 2019-08-30 DIAGNOSIS — E119 Type 2 diabetes mellitus without complications: Secondary | ICD-10-CM | POA: Diagnosis not present

## 2019-08-30 DIAGNOSIS — I10 Essential (primary) hypertension: Secondary | ICD-10-CM | POA: Diagnosis not present

## 2019-09-01 DIAGNOSIS — M25552 Pain in left hip: Secondary | ICD-10-CM | POA: Diagnosis not present

## 2019-09-01 DIAGNOSIS — I10 Essential (primary) hypertension: Secondary | ICD-10-CM | POA: Diagnosis not present

## 2019-09-01 DIAGNOSIS — S72142A Displaced intertrochanteric fracture of left femur, initial encounter for closed fracture: Secondary | ICD-10-CM | POA: Diagnosis not present

## 2019-09-01 DIAGNOSIS — Z9181 History of falling: Secondary | ICD-10-CM | POA: Diagnosis not present

## 2019-09-01 DIAGNOSIS — S72142D Displaced intertrochanteric fracture of left femur, subsequent encounter for closed fracture with routine healing: Secondary | ICD-10-CM | POA: Diagnosis not present

## 2019-09-01 DIAGNOSIS — E119 Type 2 diabetes mellitus without complications: Secondary | ICD-10-CM | POA: Diagnosis not present

## 2019-09-05 DIAGNOSIS — S72002A Fracture of unspecified part of neck of left femur, initial encounter for closed fracture: Secondary | ICD-10-CM | POA: Diagnosis not present

## 2019-09-06 ENCOUNTER — Other Ambulatory Visit: Payer: Self-pay | Admitting: Internal Medicine

## 2019-09-06 DIAGNOSIS — E119 Type 2 diabetes mellitus without complications: Secondary | ICD-10-CM | POA: Diagnosis not present

## 2019-09-06 DIAGNOSIS — I10 Essential (primary) hypertension: Secondary | ICD-10-CM | POA: Diagnosis not present

## 2019-09-06 DIAGNOSIS — S72142A Displaced intertrochanteric fracture of left femur, initial encounter for closed fracture: Secondary | ICD-10-CM | POA: Diagnosis not present

## 2019-09-06 DIAGNOSIS — M25552 Pain in left hip: Secondary | ICD-10-CM | POA: Diagnosis not present

## 2019-09-06 DIAGNOSIS — Z9181 History of falling: Secondary | ICD-10-CM | POA: Diagnosis not present

## 2019-09-06 DIAGNOSIS — S72142D Displaced intertrochanteric fracture of left femur, subsequent encounter for closed fracture with routine healing: Secondary | ICD-10-CM | POA: Diagnosis not present

## 2019-09-08 DIAGNOSIS — S72142A Displaced intertrochanteric fracture of left femur, initial encounter for closed fracture: Secondary | ICD-10-CM | POA: Diagnosis not present

## 2019-09-08 DIAGNOSIS — S72142D Displaced intertrochanteric fracture of left femur, subsequent encounter for closed fracture with routine healing: Secondary | ICD-10-CM | POA: Diagnosis not present

## 2019-09-08 DIAGNOSIS — Z9181 History of falling: Secondary | ICD-10-CM | POA: Diagnosis not present

## 2019-09-08 DIAGNOSIS — E119 Type 2 diabetes mellitus without complications: Secondary | ICD-10-CM | POA: Diagnosis not present

## 2019-09-08 DIAGNOSIS — M25552 Pain in left hip: Secondary | ICD-10-CM | POA: Diagnosis not present

## 2019-09-08 DIAGNOSIS — I10 Essential (primary) hypertension: Secondary | ICD-10-CM | POA: Diagnosis not present

## 2019-09-13 DIAGNOSIS — S72142D Displaced intertrochanteric fracture of left femur, subsequent encounter for closed fracture with routine healing: Secondary | ICD-10-CM | POA: Diagnosis not present

## 2019-09-13 DIAGNOSIS — M25552 Pain in left hip: Secondary | ICD-10-CM | POA: Diagnosis not present

## 2019-09-13 DIAGNOSIS — E119 Type 2 diabetes mellitus without complications: Secondary | ICD-10-CM | POA: Diagnosis not present

## 2019-09-13 DIAGNOSIS — I10 Essential (primary) hypertension: Secondary | ICD-10-CM | POA: Diagnosis not present

## 2019-09-13 DIAGNOSIS — Z9181 History of falling: Secondary | ICD-10-CM | POA: Diagnosis not present

## 2019-09-13 DIAGNOSIS — S72142A Displaced intertrochanteric fracture of left femur, initial encounter for closed fracture: Secondary | ICD-10-CM | POA: Diagnosis not present

## 2019-09-15 DIAGNOSIS — Z9181 History of falling: Secondary | ICD-10-CM | POA: Diagnosis not present

## 2019-09-15 DIAGNOSIS — M25552 Pain in left hip: Secondary | ICD-10-CM | POA: Diagnosis not present

## 2019-09-15 DIAGNOSIS — S72142D Displaced intertrochanteric fracture of left femur, subsequent encounter for closed fracture with routine healing: Secondary | ICD-10-CM | POA: Diagnosis not present

## 2019-09-15 DIAGNOSIS — E782 Mixed hyperlipidemia: Secondary | ICD-10-CM | POA: Diagnosis not present

## 2019-09-15 DIAGNOSIS — S72142A Displaced intertrochanteric fracture of left femur, initial encounter for closed fracture: Secondary | ICD-10-CM | POA: Diagnosis not present

## 2019-09-15 DIAGNOSIS — I272 Pulmonary hypertension, unspecified: Secondary | ICD-10-CM | POA: Diagnosis not present

## 2019-09-15 DIAGNOSIS — I10 Essential (primary) hypertension: Secondary | ICD-10-CM | POA: Diagnosis not present

## 2019-09-15 DIAGNOSIS — I480 Paroxysmal atrial fibrillation: Secondary | ICD-10-CM | POA: Diagnosis not present

## 2019-09-15 DIAGNOSIS — E119 Type 2 diabetes mellitus without complications: Secondary | ICD-10-CM | POA: Diagnosis not present

## 2019-09-15 DIAGNOSIS — I2510 Atherosclerotic heart disease of native coronary artery without angina pectoris: Secondary | ICD-10-CM | POA: Diagnosis not present

## 2019-09-15 DIAGNOSIS — I4819 Other persistent atrial fibrillation: Secondary | ICD-10-CM | POA: Diagnosis not present

## 2019-09-26 ENCOUNTER — Other Ambulatory Visit: Payer: Self-pay

## 2019-09-28 ENCOUNTER — Other Ambulatory Visit: Payer: Self-pay

## 2019-09-28 ENCOUNTER — Encounter: Payer: Self-pay | Admitting: Internal Medicine

## 2019-09-28 ENCOUNTER — Ambulatory Visit (INDEPENDENT_AMBULATORY_CARE_PROVIDER_SITE_OTHER): Payer: Medicare Other | Admitting: Internal Medicine

## 2019-09-28 VITALS — BP 125/64 | HR 86 | Temp 96.7°F | Ht 62.99 in | Wt 125.0 lb

## 2019-09-28 DIAGNOSIS — K529 Noninfective gastroenteritis and colitis, unspecified: Secondary | ICD-10-CM

## 2019-09-28 DIAGNOSIS — I10 Essential (primary) hypertension: Secondary | ICD-10-CM

## 2019-09-28 DIAGNOSIS — D509 Iron deficiency anemia, unspecified: Secondary | ICD-10-CM | POA: Diagnosis not present

## 2019-09-28 DIAGNOSIS — E538 Deficiency of other specified B group vitamins: Secondary | ICD-10-CM | POA: Diagnosis not present

## 2019-09-28 DIAGNOSIS — D510 Vitamin B12 deficiency anemia due to intrinsic factor deficiency: Secondary | ICD-10-CM

## 2019-09-28 DIAGNOSIS — E1140 Type 2 diabetes mellitus with diabetic neuropathy, unspecified: Secondary | ICD-10-CM | POA: Diagnosis not present

## 2019-09-28 DIAGNOSIS — L89151 Pressure ulcer of sacral region, stage 1: Secondary | ICD-10-CM | POA: Diagnosis not present

## 2019-09-28 DIAGNOSIS — S72142S Displaced intertrochanteric fracture of left femur, sequela: Secondary | ICD-10-CM

## 2019-09-28 DIAGNOSIS — L84 Corns and callosities: Secondary | ICD-10-CM

## 2019-09-28 LAB — IBC + FERRITIN
Ferritin: 49.7 ng/mL (ref 10.0–291.0)
Iron: 104 ug/dL (ref 42–145)
Saturation Ratios: 27 % (ref 20.0–50.0)
Transferrin: 275 mg/dL (ref 212.0–360.0)

## 2019-09-28 LAB — COMPREHENSIVE METABOLIC PANEL
ALT: 16 U/L (ref 0–35)
AST: 20 U/L (ref 0–37)
Albumin: 3.8 g/dL (ref 3.5–5.2)
Alkaline Phosphatase: 81 U/L (ref 39–117)
BUN: 18 mg/dL (ref 6–23)
CO2: 26 mEq/L (ref 19–32)
Calcium: 9.2 mg/dL (ref 8.4–10.5)
Chloride: 105 mEq/L (ref 96–112)
Creatinine, Ser: 0.9 mg/dL (ref 0.40–1.20)
GFR: 58.18 mL/min — ABNORMAL LOW (ref >60.00)
Glucose, Bld: 136 mg/dL — ABNORMAL HIGH (ref 70–99)
Potassium: 3.6 mEq/L (ref 3.5–5.1)
Sodium: 138 mEq/L (ref 135–145)
Total Bilirubin: 1.2 mg/dL (ref 0.2–1.2)
Total Protein: 6.7 g/dL (ref 6.0–8.3)

## 2019-09-28 LAB — CBC WITH DIFFERENTIAL/PLATELET
Basophils Absolute: 0 10*3/uL (ref 0.0–0.1)
Basophils Relative: 0.4 % (ref 0.0–3.0)
Eosinophils Absolute: 0.1 10*3/uL (ref 0.0–0.7)
Eosinophils Relative: 0.9 % (ref 0.0–5.0)
HCT: 41.6 % (ref 36.0–46.0)
Hemoglobin: 13.9 g/dL (ref 12.0–15.0)
Lymphocytes Relative: 40.4 % (ref 12.0–46.0)
Lymphs Abs: 2.7 10*3/uL (ref 0.7–4.0)
MCHC: 33.5 g/dL (ref 30.0–36.0)
MCV: 91.4 fl (ref 78.0–100.0)
Monocytes Absolute: 0.4 10*3/uL (ref 0.1–1.0)
Monocytes Relative: 5.7 % (ref 3.0–12.0)
Neutro Abs: 3.5 10*3/uL (ref 1.4–7.7)
Neutrophils Relative %: 52.6 % (ref 43.0–77.0)
Platelets: 167 10*3/uL (ref 150.0–400.0)
RBC: 4.55 Mil/uL (ref 3.87–5.11)
RDW: 13.8 % (ref 11.5–15.5)
WBC: 6.7 10*3/uL (ref 4.0–10.5)

## 2019-09-28 LAB — HEMOGLOBIN A1C: Hgb A1c MFr Bld: 6.8 % — ABNORMAL HIGH (ref 4.6–6.5)

## 2019-09-28 LAB — VITAMIN B12: Vitamin B-12: 542 pg/mL (ref 211–911)

## 2019-09-28 MED ORDER — DIPHENOXYLATE-ATROPINE 2.5-0.025 MG PO TABS: ORAL_TABLET | ORAL | 3 refills | Status: DC

## 2019-09-28 NOTE — Progress Notes (Signed)
Subjective:  Patient ID: Kara [name], female    DOB: [birth year]  Age: 86 y.o. MRN: [id number]  CC: The primary encounter diagnosis was Type 2 diabetes mellitus with diabetic neuropathy, without long-term current use of insulin ([place]). Diagnoses of Chronic diarrhea, Iron deficiency anemia, unspecified iron deficiency anemia type, B12 deficiency, Pre-ulcerative corn or callous, Decubitus ulcer of sacral region, stage 1, Displaced intertrochanteric fracture of left femur, sequela, Essential hypertension, and Pernicious anemia were also pertinent to this visit.  HPI Kara [name] presents for follow up on left intertrochanteric hip fracture in Jan 2021  Treated at [facility]   Patient has received both doses of the available COVID 19 vaccine without complications.  Patient continues to mask when outside of the home except when walking in yard or at safe distances from others .  Patient denies any change in mood or development of unhealthy behaviors resuting from the pandemic's restriction of activities and socialization.    This visit occurred during the SARS-CoV-2 public health emergency.  Safety protocols were in place, including screening questions prior to the visit, additional usage of staff PPE, and extensive cleaning of exam room while observing appropriate contact time as indicated for disinfecting solutions.   HX: had a fall at home early  I the morning on Jan 21  Before she had eaten ,  On way to dressing room.  Lost balance while turning around .  Was unable to get up unassisted due to severe pain in left hip due to fracture. ,  Used her life alert to get help.   Admitted jan 21 to jan 26,  transferred to [facility]   For rehab for 2 months.  Felt that it was the best choice for [place].  But compared to [facility] it was subpar .  Was not offered pain medication during rehab.    Daughter  Stayed with her last week,  Now using an aide until 1 pm daily .  Lives at [place] independently.   Not cooking, receives meals from neighbor Has chronic diarrhea managed with Lomotil preferred over Imodium . Taking 3 daily   T2DM:  She  feels generally well,  But is not  exercising regularly or trying to lose weight. Checking  blood sugars less than once daily at variable times, usually only if she feels she may be having a hypoglycemic event. .  BS have been under 130 fasting and < 150 post prandially.  Denies any recent hypoglyemic events.  Taking   medications as directed. Following a carbohydrate modified diet 6 days per week. Appetite is good. Diabetic neuropathy  Now bothering her during the day   Described as numbness and burning . Described as neuropathy but never addressed   Zippered compression stockings for VI.    Hypertension: patient checks blood pressure twice weekly at home.  Readings have been for the most part < 140/80 at rest . Patient is following a reduce salt diet most days and is taking medications as prescribed  History of decubitus ulcer 5 years ago after surgery,  Managed with doughnut pillow.  Recurrent with a knot that has become tender . Using zinc , PT gave her a gel cushion instead of the pillw which she did not like   Outpatient Medications Prior to Visit  Medication Sig Dispense Refill  . acetaminophen (TYLENOL) 325 MG tablet Take 2 tablets (650 mg total) by mouth every 6 (six) hours as needed for mild pain or moderate pain (pain score  1-3 or temp > 100.5).    Marland Kitchen apixaban (ELIQUIS) 2.5 MG TABS tablet Take 2.5 mg by mouth 2 (two) times daily.    . cholecalciferol (VITAMIN D) 1000 UNITS tablet Take 1,000 Units by mouth daily.    . clobetasol cream (TEMOVATE) 0.05 % Apply topically.    . Cranberry 200 MG CAPS Take 1 capsule (200 mg total) by mouth daily. 30 capsule 2  . cyanocobalamin (,VITAMIN B-12,) 1000 MCG/ML injection INJECT 1ML INTO THE MUSCLE EVERY 14 DAYS 10 mL 0  . estradiol (ESTRACE VAGINAL) 0.1 MG/GM vaginal cream Place vaginally.    . metoprolol  succinate (TOPROL-XL) 25 MG 24 hr tablet TAKE 1 TALET BY MOUTH AT BEDTIME 90 tablet 1  . triamcinolone cream (KENALOG) 0.1 % Apply 1 application topically 2 (two) times daily. To vulva until itching resolves,  Then reduce use to twice weekly (Patient taking differently: Apply 1 application topically 2 (two) times a week. ) 30 g 0  . diltiazem (CARDIZEM CD) 180 MG 24 hr capsule TAKE 1 CAPSULE BY MOUTH EVERY MORNING (Patient not taking: Reported on 09/28/2019) 90 capsule 1  . diphenoxylate-atropine (LOMOTIL) 2.5-0.025 MG tablet TAKE ONE TABLET BY MOUTH TWICE A DAY AS NEEDED FOR DIARRHEA (Patient not taking: Reported on 09/28/2019) 60 tablet 3  . feeding supplement, ENSURE ENLIVE, (ENSURE ENLIVE) LIQD Take 237 mLs by mouth 2 (two) times daily between meals. (Patient not taking: Reported on 09/28/2019) 237 mL 12  . ferrous sulfate 325 (65 FE) MG tablet Take 1 tablet (325 mg total) by mouth daily with breakfast. (Patient not taking: Reported on 09/28/2019) 30 tablet 3  . HYDROcodone-acetaminophen (NORCO) 7.5-325 MG tablet Take 1 tablet by mouth every 6 (six) hours as needed for severe pain (pain score 7-10). (Patient not taking: Reported on 09/28/2019) 12 tablet 0  . senna (SENOKOT) 8.6 MG TABS tablet Take 1 tablet (8.6 mg total) by mouth 2 (two) times daily. (Patient not taking: Reported on 09/28/2019) 120 tablet 0   No facility-administered medications prior to visit.    Review of Systems;  Patient denies headache, fevers, malaise, unintentional weight loss, skin rash, eye pain, sinus congestion and sinus pain, sore throat, dysphagia,  hemoptysis , cough, dyspnea, wheezing, chest pain, palpitations, orthopnea, edema, abdominal pain, nausea, melena, diarrhea, constipation, flank pain, dysuria, hematuria, urinary  Frequency, nocturia, numbness, tingling, seizures,  Focal weakness, Loss of consciousness,  Tremor, insomnia, depression, anxiety, and suicidal ideation.      Objective:  BP 125/64 (BP Location:  Left Arm, Patient Position: Sitting)   Pulse 86   Temp (!) 96.7 F (35.9 C) (Temporal)   Ht 5' 2.99" (1.6 m)   Wt 125 lb (56.7 kg)   SpO2 96%   BMI 22.15 kg/m   BP Readings from Last 3 Encounters:  09/28/19 125/64  06/14/19 (!) 120/44  05/27/19 (!) 148/65    Wt Readings from Last 3 Encounters:  09/28/19 125 lb (56.7 kg)  06/24/19 149 lb (67.6 kg)  06/10/19 149 lb 1.6 oz (67.6 kg)    General appearance: alert, cooperative and appears stated age Ears: normal TM's and external ear canals both ears Throat: lips, mucosa, and tongue normal; teeth and gums normal Neck: no adenopathy, no carotid bruit, supple, symmetrical, trachea midline and thyroid not enlarged, symmetric, no tenderness/mass/nodules Back: symmetric, no curvature. ROM normal. No CVA tenderness. Lungs: clear to auscultation bilaterally Heart: regular rate and rhythm, S1, S2 normal, no murmur, click, rub or gallop Abdomen: soft, non-tender; bowel sounds normal;  no masses,  no organomegaly Pulses: 2+ and symmetric Skin: Skin color, texture, turgor normal. No rashes or lesions Lymph nodes: Cervical, supraclavicular, and axillary nodes normal.  Lab Results  Component Value Date   HGBA1C 6.8 (H) 09/28/2019   HGBA1C 7.4 (H) 02/04/2019   HGBA1C 6.6 (H) 04/12/2018    Lab Results  Component Value Date   CREATININE 0.90 09/28/2019   CREATININE 0.85 06/13/2019   CREATININE 0.82 06/12/2019    Lab Results  Component Value Date   WBC 6.7 09/28/2019   HGB 13.9 09/28/2019   HCT 41.6 09/28/2019   PLT 167.0 09/28/2019   GLUCOSE 136 (H) 09/28/2019   CHOL 142 10/11/2013   TRIG 194.0 (H) 10/11/2013   HDL 43.10 10/11/2013   LDLDIRECT 86.3 06/30/2012   LDLCALC 60 10/11/2013   ALT 16 09/28/2019   AST 20 09/28/2019   NA 138 09/28/2019   K 3.6 09/28/2019   CL 105 09/28/2019   CREATININE 0.90 09/28/2019   BUN 18 09/28/2019   CO2 26 09/28/2019   TSH 3.35 04/07/2017   INR 1.2 06/09/2019   HGBA1C 6.8 (H) 09/28/2019    MICROALBUR 0.9 04/12/2018    No results found.  Assessment & Plan:   Problem List Items Addressed This Visit      Unprioritized   Chronic diarrhea (Chronic)    Imodium not effective.  Lomotil refilled       Relevant Medications   diphenoxylate-atropine (LOMOTIL) 2.5-0.025 MG tablet   Displaced intertrochanteric fracture of left femur, sequela    S/p surgical correction by Earnestine Leys.  She is ambulating better using a walker and living at home.       Essential hypertension    Well controlled on current regimen. Renal function stable, no changes today.      Pernicious anemia    Chronic, secondary to history of small and large bowel resection in 2013 for recurrent  radiation enteritis induced SBO . Continue IM supplementation      Type 2 diabetes mellitus with diabetic neuropathy, unspecified ([place]) - Primary   Relevant Orders   Hemoglobin A1c (Completed)   Comprehensive metabolic panel (Completed)    Other Visit Diagnoses    Iron deficiency anemia, unspecified iron deficiency anemia type       Relevant Orders   CBC with Differential/Platelet (Completed)   IBC + Ferritin (Completed)   B12 deficiency       Relevant Orders   Vitamin B12 (Completed)   Pre-ulcerative corn or callous       Relevant Orders   Ambulatory referral to General Surgery   For home use only DME Other see comment   Decubitus ulcer of sacral region, stage 1       Relevant Orders   Ambulatory referral to General Surgery   For home use only DME Other see comment      I have discontinued Daleen Squibb. [place]'s diltiazem, HYDROcodone-acetaminophen, senna, ferrous sulfate, and feeding supplement (ENSURE ENLIVE). I have also changed her diphenoxylate-atropine. Additionally, I am having her maintain her cholecalciferol, apixaban, Cranberry, triamcinolone cream, cyanocobalamin, acetaminophen, metoprolol succinate, estradiol, and clobetasol cream.  Meds ordered this encounter  Medications  .  diphenoxylate-atropine (LOMOTIL) 2.5-0.025 MG tablet    Sig: TAKE ONE TABLET BY MOUTH 3 times daily AS NEEDED FOR DIARRHEA    Dispense:  90 tablet    Refill:  3    Medications Discontinued During This Encounter  Medication Reason  . senna (SENOKOT) 8.6 MG TABS tablet Completed Course  .  HYDROcodone-acetaminophen (NORCO) 7.5-325 MG tablet Completed Course  . ferrous sulfate 325 (65 FE) MG tablet Completed Course  . feeding supplement, ENSURE ENLIVE, (ENSURE ENLIVE) LIQD Completed Course  . diphenoxylate-atropine (LOMOTIL) 2.5-0.025 MG tablet Completed Course  . diltiazem (CARDIZEM CD) 180 MG 24 hr capsule Completed Course    Follow-up: No follow-ups on file.   Crecencio Mc, MD

## 2019-09-28 NOTE — Patient Instructions (Addendum)
Enlist your aide to help you get your compression stockings on in the morning  You have a callous/corn on your bottom that is causing your pain ,  So use the doughnut pillow .  I have made a referral to Dr Bary Castilla    Diabetic Neuropathy Diabetic neuropathy refers to nerve damage that is caused by diabetes (diabetes mellitus). Over time, people with diabetes can develop nerve damage throughout the body. There are several types of diabetic neuropathy:  Peripheral neuropathy. This is the most common type of diabetic neuropathy. It causes damage to nerves that carry signals between the spinal cord and other parts of the body (peripheral nerves). This usually affects nerves in the feet and legs first, and may eventually affect the hands and arms. The damage affects the ability to sense touch or temperature.  Autonomic neuropathy. This type causes damage to nerves that control involuntary functions (autonomic nerves). These nerves carry signals that control: ? Heartbeat. ? Body temperature. ? Blood pressure. ? Urination. ? Digestion. ? Sweating. ? Sexual function. ? Response to changing blood sugar (glucose) levels.  Focal neuropathy. This type of nerve damage affects one area of the body, such as an arm, a leg, or the face. The injury may involve one nerve or a small group of nerves. Focal neuropathy can be painful and unpredictable, and occurs most often in older adults with diabetes. This often develops suddenly, but usually improves over time and does not cause long-term problems.  Proximal neuropathy. This type of nerve damage affects the nerves of the thighs, hips, buttocks, or legs. It causes severe pain, weakness, and muscle death (atrophy), usually in the thigh muscles. It is more common among older men and people who have type 2 diabetes. The length of recovery time may vary. What are the causes? Peripheral, autonomic, and focal neuropathies are caused by diabetes that is not well  controlled with treatment. The cause of proximal neuropathy is not known, but it may be caused by inflammation related to uncontrolled blood glucose levels. What are the signs or symptoms? Peripheral neuropathy Peripheral neuropathy develops slowly over time. When the nerves of the feet and legs no longer work, you may experience:  Burning, stabbing, or aching pain in the legs or feet.  Pain or cramping in the legs or feet.  Loss of feeling (numbness) and inability to feel pressure or pain in the feet. This can lead to: ? Thick calluses or sores on areas of constant pressure. ? Ulcers. ? Reduced ability to feel temperature changes.  Foot deformities.  Muscle weakness.  Loss of balance or coordination. Autonomic neuropathy The symptoms of autonomic neuropathy vary depending on which nerves are affected. Symptoms may include:  Problems with digestion, such as: ? Nausea or vomiting. ? Poor appetite. ? Bloating. ? Diarrhea or constipation. ? Trouble swallowing. ? Losing weight without trying to.  Problems with the heart, blood and lungs, such as: ? Dizziness, especially when standing up. ? Fainting. ? Shortness of breath. ? Irregular heartbeat.  Bladder problems, such as: ? Trouble starting or stopping urination. ? Leaking urine. ? Trouble emptying the bladder. ? Urinary tract infections (UTIs).  Problems with other body functions, such as: ? Sweat. You may sweat too much or too little. ? Temperature. You might get hot easily. Or, you might feel cold more than usual. ? Sexual function. Men may not be able to get or maintain an erection. Women may have vaginal dryness and difficulty with arousal. Focal neuropathy Symptoms affect  only one area of the body. Common symptoms include:  Numbness.  Tingling.  Burning pain.  Prickling feeling.  Very sensitive skin.  Weakness.  Inability to move (paralysis).  Muscle twitching.  Muscles getting smaller  (wasting).  Poor coordination.  Double or blurred vision. Proximal neuropathy  Sudden, severe pain in the hip, thigh, or buttocks. Pain may spread from the back into the legs (sciatica).  Pain and numbness in the arms and legs.  Tingling.  Loss of bladder control or bowel control.  Weakness and wasting of thigh muscles.  Difficulty getting up from a seated position.  Abdominal swelling.  Unexplained weight loss. How is this diagnosed? Diagnosis usually involves reviewing your medical history and any symptoms you have. Diagnosis varies depending on the type of neuropathy your health care provider suspects. Peripheral neuropathy Your health care provider will check areas that are affected by your nervous system (neurologic exam), such as your reflexes, how you move, and what you can feel. You may have other tests, such as:  Blood tests.  Removal and examination of fluid that surrounds the spinal cord (lumbar puncture).  CT scan.  MRI.  A test to check the nerves that control muscles (electromyogram, EMG).  Tests of how quickly messages pass through your nerves (nerve conduction velocity tests).  Removal of a small piece of nerve to be examined under a microscope (biopsy). Autonomic neuropathy You may have tests, such as:  Tests to measure your blood pressure and heart rate. This may include monitoring you while you are safely secured to an exam table that moves you from a lying position to an upright position (table tilt test).  Breathing tests to check your lungs.  Tests to check how food moves through the digestive system (gastric emptying tests).  Blood, sweat, or urine tests.  Ultrasound of your bladder.  Spinal fluid tests. Focal neuropathy This condition may be diagnosed with:  A neurologic exam.  CT scan.  MRI.  EMG.  Nerve conduction velocity tests. Proximal neuropathy There is no test to diagnose this type of neuropathy. You may have tests to  rule out other possible causes of this type of neuropathy. Tests may include:  X-rays of your spine and lumbar region.  Lumbar puncture.  MRI. How is this treated? The goal of treatment is to keep nerve damage from getting worse. The most important part of treatment is keeping your blood glucose level and your A1C level within your target range by following your diabetes management plan. Over time, maintaining lower blood glucose levels helps lessen symptoms. In some cases, you may need prescription pain medicine. Follow these instructions at home:  Lifestyle   Do not use any products that contain nicotine or tobacco, such as cigarettes and e-cigarettes. If you need help quitting, ask your health care provider.  Be physically active every day. Include strength training and balance exercises.  Follow a healthy meal plan.  Work with your health care provider to manage your blood pressure. General instructions  Follow your diabetes management plan as directed. ? Check your blood glucose levels as directed by your health care provider. ? Keep your blood glucose in your target range as directed by your health care provider. ? Have your A1C level checked at least two times a year, or as often as told by your health care provider.  Take over the counter and prescription medicines only as told by your health care provider. This includes insulin and diabetes medicine.  Do not drive  or use heavy machinery while taking prescription pain medicines.  Check your skin and feet every day for cuts, bruises, redness, blisters, or sores.  Keep all follow up visits as told by your health care provider. This is important. Contact a health care provider if:  You have burning, stabbing, or aching pain in your legs or feet.  You are unable to feel pressure or pain in your feet.  You develop problems with digestion, such  as: ? Nausea. ? Vomiting. ? Bloating. ? Constipation. ? Diarrhea. ? Abdominal pain.  You have difficulty with urination, such as inability: ? To control when you urinate (incontinence). ? To completely empty the bladder (retention).  You have palpitations.  You feel dizzy, weak, or faint when you stand up. Get help right away if:  You cannot urinate.  You have sudden weakness or loss of coordination.  You have trouble speaking.  You have pain or pressure in your chest.  You have an irregular heart beat.  You have sudden inability to move a part of your body. Summary  Diabetic neuropathy refers to nerve damage that is caused by diabetes. It can affect nerves throughout the entire body, causing numbness and pain in the arms, legs, digestive tract, heart, and other body systems.  Keep your blood glucose level and your blood pressure in your target range, as directed by your health care provider. This can help prevent neuropathy from getting worse.  Check your skin and feet every day for cuts, bruises, redness, blisters, or sores.  Do not use any products that contain nicotine or tobacco, such as cigarettes and e-cigarettes. If you need help quitting, ask your health care provider. This information is not intended to replace advice given to you by your health care provider. Make sure you discuss any questions you have with your health care provider. Document Revised: 06/17/2017 Document Reviewed: 06/09/2016 Elsevier Patient Education  2020 [employer].

## 2019-09-29 NOTE — Assessment & Plan Note (Signed)
Chronic, secondary to history of small and large bowel resection in 2013 for recurrent  radiation enteritis induced SBO . Continue IM supplementation

## 2019-09-29 NOTE — Assessment & Plan Note (Signed)
Historically well-controlled on diet alone .  hemoglobin A1c has been consistently at or  less than 7.0 . Patient is up-to-date on eye exams and foot exam is normal today. Patient has no microalbuminuria. Patient is tolerating statin therapy for CAD risk reduction and on ACE/ARB for renal protection and hypertension   Lab Results  Component Value Date   HGBA1C 6.8 (H) 09/28/2019

## 2019-09-29 NOTE — Assessment & Plan Note (Signed)
Well controlled on current regimen. Renal function stable, no changes today. 

## 2019-09-29 NOTE — Assessment & Plan Note (Signed)
Imodium not effective.  Lomotil refilled

## 2019-09-29 NOTE — Assessment & Plan Note (Signed)
S/p surgical correction by Earnestine Leys.  She is ambulating better using a walker and living at home.

## 2019-10-04 DIAGNOSIS — I495 Sick sinus syndrome: Secondary | ICD-10-CM | POA: Diagnosis not present

## 2019-10-18 DIAGNOSIS — L89151 Pressure ulcer of sacral region, stage 1: Secondary | ICD-10-CM | POA: Diagnosis not present

## 2019-10-19 DIAGNOSIS — M6281 Muscle weakness (generalized): Secondary | ICD-10-CM | POA: Diagnosis not present

## 2019-10-19 DIAGNOSIS — M25542 Pain in joints of left hand: Secondary | ICD-10-CM | POA: Diagnosis not present

## 2019-10-19 DIAGNOSIS — R2689 Other abnormalities of gait and mobility: Secondary | ICD-10-CM | POA: Diagnosis not present

## 2019-10-19 DIAGNOSIS — M25552 Pain in left hip: Secondary | ICD-10-CM | POA: Diagnosis not present

## 2019-10-19 DIAGNOSIS — M25541 Pain in joints of right hand: Secondary | ICD-10-CM | POA: Diagnosis not present

## 2019-10-19 DIAGNOSIS — R279 Unspecified lack of coordination: Secondary | ICD-10-CM | POA: Diagnosis not present

## 2019-10-19 DIAGNOSIS — S72142D Displaced intertrochanteric fracture of left femur, subsequent encounter for closed fracture with routine healing: Secondary | ICD-10-CM | POA: Diagnosis not present

## 2019-10-19 DIAGNOSIS — M1990 Unspecified osteoarthritis, unspecified site: Secondary | ICD-10-CM | POA: Diagnosis not present

## 2019-10-19 DIAGNOSIS — Z9181 History of falling: Secondary | ICD-10-CM | POA: Diagnosis not present

## 2019-10-19 DIAGNOSIS — G5601 Carpal tunnel syndrome, right upper limb: Secondary | ICD-10-CM | POA: Diagnosis not present

## 2019-10-20 DIAGNOSIS — G5601 Carpal tunnel syndrome, right upper limb: Secondary | ICD-10-CM | POA: Diagnosis not present

## 2019-10-20 DIAGNOSIS — Z9181 History of falling: Secondary | ICD-10-CM | POA: Diagnosis not present

## 2019-10-20 DIAGNOSIS — M25552 Pain in left hip: Secondary | ICD-10-CM | POA: Diagnosis not present

## 2019-10-20 DIAGNOSIS — S72142D Displaced intertrochanteric fracture of left femur, subsequent encounter for closed fracture with routine healing: Secondary | ICD-10-CM | POA: Diagnosis not present

## 2019-10-20 DIAGNOSIS — M25541 Pain in joints of right hand: Secondary | ICD-10-CM | POA: Diagnosis not present

## 2019-10-20 DIAGNOSIS — M25542 Pain in joints of left hand: Secondary | ICD-10-CM | POA: Diagnosis not present

## 2019-10-24 DIAGNOSIS — G5601 Carpal tunnel syndrome, right upper limb: Secondary | ICD-10-CM | POA: Diagnosis not present

## 2019-10-24 DIAGNOSIS — M25541 Pain in joints of right hand: Secondary | ICD-10-CM | POA: Diagnosis not present

## 2019-10-24 DIAGNOSIS — Z9181 History of falling: Secondary | ICD-10-CM | POA: Diagnosis not present

## 2019-10-24 DIAGNOSIS — M25552 Pain in left hip: Secondary | ICD-10-CM | POA: Diagnosis not present

## 2019-10-24 DIAGNOSIS — M25542 Pain in joints of left hand: Secondary | ICD-10-CM | POA: Diagnosis not present

## 2019-10-24 DIAGNOSIS — S72142D Displaced intertrochanteric fracture of left femur, subsequent encounter for closed fracture with routine healing: Secondary | ICD-10-CM | POA: Diagnosis not present

## 2019-10-26 DIAGNOSIS — G5601 Carpal tunnel syndrome, right upper limb: Secondary | ICD-10-CM | POA: Diagnosis not present

## 2019-10-26 DIAGNOSIS — Z9181 History of falling: Secondary | ICD-10-CM | POA: Diagnosis not present

## 2019-10-26 DIAGNOSIS — S72142D Displaced intertrochanteric fracture of left femur, subsequent encounter for closed fracture with routine healing: Secondary | ICD-10-CM | POA: Diagnosis not present

## 2019-10-26 DIAGNOSIS — M25541 Pain in joints of right hand: Secondary | ICD-10-CM | POA: Diagnosis not present

## 2019-10-26 DIAGNOSIS — M25542 Pain in joints of left hand: Secondary | ICD-10-CM | POA: Diagnosis not present

## 2019-10-26 DIAGNOSIS — M25552 Pain in left hip: Secondary | ICD-10-CM | POA: Diagnosis not present

## 2019-10-28 ENCOUNTER — Ambulatory Visit (INDEPENDENT_AMBULATORY_CARE_PROVIDER_SITE_OTHER): Payer: Medicare Other

## 2019-10-28 VITALS — Ht 62.99 in | Wt 125.0 lb

## 2019-10-28 DIAGNOSIS — Z0000 Encounter for general adult medical examination without abnormal findings: Secondary | ICD-10-CM

## 2019-10-28 NOTE — Progress Notes (Addendum)
Subjective:   Kara [name] is a 86 y.o. female who presents for Medicare Annual (Subsequent) preventive examination.  Review of Systems:  No ROS.  Medicare Wellness Virtual Visit.  Cardiac Risk Factors include: advanced age (>71men, >36 women);diabetes mellitus;hypertension     Objective:     Vitals: Ht 5' 2.99" (1.6 m)   Wt 125 lb (56.7 kg)   BMI 22.15 kg/m   Body mass index is 22.15 kg/m.  Advanced Directives 10/28/2019 06/09/2019 06/09/2019 10/27/2018 10/16/2017 11/11/2016 10/14/2016  Does Patient Have a Medical Advance Directive? Yes Yes Yes Yes Yes Yes Yes  Type of Paramedic of [place];Living will [place];Living will [place];Living will Living will;Healthcare Power of Attorney Out of facility DNR (pink MOST or yellow form) [place];Living will [place];Living will  Does patient want to make changes to medical advance directive? No - Patient declined No - Patient declined - No - Patient declined No - Patient declined - No - Patient declined  Copy of [place] in Chart? No - copy requested No - copy requested - No - copy requested - - No - copy requested    Tobacco Social History   Tobacco Use  Smoking Status Former Smoker  Smokeless Tobacco Never Used  Tobacco Comment   quit 45 years ago     Counseling given: Not Answered Comment: quit 45 years ago   Clinical Intake:  Pre-visit preparation completed: Yes        Diabetes: Yes (Followed by pcp)  How often do you need to have someone help you when you read instructions, pamphlets, or other written materials from your doctor or pharmacy?: 1 - Never  Interpreter Needed?: No     Past Medical History:  Diagnosis Date  . Allergy   . Atrial fibrillation ([place])   . CAD (coronary artery disease)   . Cancer (HCC)    UTERINE  . Chronic anticoagulation   . Chronic diarrhea   . Decubitus ulcer     sacral region  . Diabetes ([place])    diet controlled  . Dysuria   . Edema of lower extremity    mainly right foot, slightly in left foot.  . Fibrocystic breast disease   . GERD (gastroesophageal reflux disease)   . Glaucoma   . Glaucoma   . Hematuria   . Hemorrhoids   . History of colon polyps   . History of pancreatitis   . Hyperlipidemia   . Hypertension   . Hypokalemia   . IBS (irritable bowel syndrome)   . Microscopic hematuria   . Mitral valve regurgitation   . Osteoarthritis    fingers  . Pernicious anemia   . Plantar fasciitis   . Recurrent UTI   . Skin cancer   . Sleep apnea, obstructive   . Vaginal atrophy   . Vitamin D deficiency   . Yeast vaginitis    Past Surgical History:  Procedure Laterality Date  . ABDOMINAL HYSTERECTOMY  1980's  . ABDOMINAL SURGERY     for villous polyp,,,many years ago  . APPENDECTOMY  1940's  . ASCAD, s/p PTCA  11/28/2005   MID LESION   . BREAST BIOPSY Left 1970's  . CARPAL TUNNEL RELEASE Right 12/13/2015   Procedure: CARPAL TUNNEL RELEASE;  Surgeon: Thornton Park, MD;  Location: [facility] ORS;  Service: Orthopedics;  Laterality: Right;  . COLECTOMY  2015  . INTRAMEDULLARY (IM) NAIL INTERTROCHANTERIC Left 06/11/2019  Procedure: INTRAMEDULLARY (IM) NAIL INTERTROCHANTRIC;  Surgeon: Earnestine Leys, MD;  Location: [facility] ORS;  Service: Orthopedics;  Laterality: Left;  . PACEMAKER PLACEMENT    . radation     for uterine cance  . REFRACTIVE SURGERY     for bilateral glaumoma  . TONSILLECTOMY  1936   Family History  Problem Relation Age of Onset  . Coronary artery disease Father   . Kidney disease Father   . Cancer Sister   . Diabetes Other   . Cancer Mother        COLON  . Bladder Cancer Neg Hx   . Breast cancer Neg Hx    Social History   Socioeconomic History  . Marital status: Widowed    Spouse name: Not on file  . Number of children: 3  . Years of education: Not on file  . Highest education level: Not on file    Occupational History  . Occupation: Retired Restaurant manager, fast food - primary  Tobacco Use  . Smoking status: Former Research scientist (life sciences)  . Smokeless tobacco: Never Used  . Tobacco comment: quit 45 years ago  Vaping Use  . Vaping Use: Never used  Substance and Sexual Activity  . Alcohol use: Yes    Alcohol/week: 0.0 standard drinks    Comment: Rarely, 1-2 times a year  . Drug use: No  . Sexual activity: Not Currently    Birth control/protection: Surgical  Other Topics Concern  . Not on file  Social History Narrative   Lives at [place]. Born in [place]. Travels frequently.      1 daughter, 2 step children      Regular Exercise -  NO   Daily Caffeine Use:  1 coffee            Social Determinants of Health   Financial Resource Strain:   . Difficulty of Paying Living Expenses:   Food Insecurity:   . Worried About Charity fundraiser in the Last Year:   . Arboriculturist in the Last Year:   Transportation Needs:   . Film/video editor (Medical):   Marland Kitchen Lack of Transportation (Non-Medical):   Physical Activity:   . Days of Exercise per Week:   . Minutes of Exercise per Session:   Stress:   . Feeling of Stress :   Social Connections:   . Frequency of Communication with Friends and Family:   . Frequency of Social Gatherings with Friends and Family:   . Attends Religious Services:   . Active Member of Clubs or Organizations:   . Attends Archivist Meetings:   Marland Kitchen Marital Status:     Outpatient Encounter Medications as of 10/28/2019  Medication Sig  . acetaminophen (TYLENOL) 325 MG tablet Take 2 tablets (650 mg total) by mouth every 6 (six) hours as needed for mild pain or moderate pain (pain score 1-3 or temp > 100.5).  Marland Kitchen apixaban (ELIQUIS) 2.5 MG TABS tablet Take 2.5 mg by mouth 2 (two) times daily.  . cholecalciferol (VITAMIN D) 1000 UNITS tablet Take 1,000 Units by mouth daily.  . clobetasol cream (TEMOVATE) 0.05 % Apply topically.  . Cranberry 200 MG CAPS  Take 1 capsule (200 mg total) by mouth daily.  . cyanocobalamin (,VITAMIN B-12,) 1000 MCG/ML injection INJECT 1ML INTO THE MUSCLE EVERY 14 DAYS  . diphenoxylate-atropine (LOMOTIL) 2.5-0.025 MG tablet TAKE ONE TABLET BY MOUTH 3 times daily AS NEEDED FOR DIARRHEA  . estradiol (ESTRACE VAGINAL) 0.1 MG/GM vaginal  cream Place vaginally.  . metoprolol succinate (TOPROL-XL) 25 MG 24 hr tablet TAKE 1 TALET BY MOUTH AT BEDTIME  . triamcinolone cream (KENALOG) 0.1 % Apply 1 application topically 2 (two) times daily. To vulva until itching resolves,  Then reduce use to twice weekly (Patient taking differently: Apply 1 application topically 2 (two) times a week. )   No facility-administered encounter medications on file as of 10/28/2019.    Activities of Daily Living In your present state of health, do you have any difficulty performing the following activities: 10/28/2019 06/09/2019  Hearing? N N  Vision? N N  Difficulty concentrating or making decisions? N N  Walking or climbing stairs? Y Y  Dressing or bathing? N N  Doing errands, shopping? Y N  Comment Not currently driving -  Preparing Food and eating ? Y -  Comment Meal prep by staff and friends. Self feeds. -  Using the Toilet? N -  In the past six months, have you accidently leaked urine? Y -  Comment Managed with daily liner -  Do you have problems with loss of bowel control? Y -  Comment Wears daily liner -  Managing your Medications? N -  Managing your Finances? Y -  Comment Daughter assist -  Housekeeping or managing your Housekeeping? Y -  Comment Maid assist 1x weekly -  Some recent data might be hidden    Patient Care Team: Crecencio Mc, MD as PCP - General (Internal Medicine)    Assessment:   This is a routine wellness examination for Kara [name].  I connected with Kara [name] today by telephone and verified that I am speaking with the correct person using two identifiers. Location patient: home Location provider: work Persons  participating in the virtual visit: patient, Marine scientist.    I discussed the limitations, risks, security and privacy concerns of performing an evaluation and management service by telephone and the availability of in person appointments. The patient expressed understanding and verbally consented to this telephonic visit.    Interactive audio and video telecommunications were attempted between this provider and patient, however failed, due to patient having technical difficulties OR patient did not have access to video capability.  We continued and completed visit with audio only.  Some vital signs may be absent or patient reported.   Health Maintenance Due: -Eye Exam- plans to schedule -Foot Exam- followed by pcp -Hgb A1c- 09/28/19 (6.8) See completed HM at the end of note.   Eye: Visual acuity not assessed. Virtual visit. Followed by their ophthalmologist. Retinopathy- none reported.  Dental: UTD    Hearing: Demonstrates normal hearing during visit.  Safety:  Patient feels safe at home- yes Patient does have smoke detectors at home- yes Patient does wear sunscreen or protective clothing when in direct sunlight - yes Patient does wear seat belt when in a moving vehicle - yes Patient drives- not currently Adequate lighting in walkways free from debris- yes Grab bars and handrails used as appropriate- yes Ambulates with an assistive device- yes; walker, rolator, cane Life/Medical alert - yes  Social: Alcohol intake - yes, rare  Smoking history- former   Smokers in home? none Illicit drug use? none  Medication: Taking as directed and without issues.  Pill box in use -yes  Self managed - yes   Covid-19: Precautions and sickness symptoms discussed. Wears mask, social distancing, hand hygiene as appropriate.   Activities of Daily Living Patient denies needing assistance with: household chores, feeding themselves, getting from bed to chair,  getting to the toilet,  bathing/showering, dressing, managing money, or preparing meals.  Maid assist with household chores as needed.   Discussed the importance of a healthy diet, water intake and the benefits of aerobic exercise.   Physical activity- PT/OT twice weekly 45-60 minutes. Lying/sitting/standing exercises 10 minutes daily.  Diet:  Regular. Adds Premier Protein to [employer] daily.  Water: fair intake Caffeine: 1-2 cup of coffee  Other Providers Patient Care Team: Crecencio Mc, MD as PCP - General (Internal Medicine)  Exercise Activities and Dietary recommendations Current Exercise Habits: Home exercise routine, Type of exercise: stretching (Physical Therapy lying/chair/standing exercises), Time (Minutes): 60, Frequency (Times/Week): 2, Weekly Exercise (Minutes/Week): 120, Intensity: Mild  Goals      Patient Stated   .  DIET - REDUCE SUGAR INTAKE (pt-stated)      Monitor diet       Fall Risk Fall Risk  10/28/2019 09/28/2019 06/24/2019 02/18/2019 01/21/2019  Falls in the past year? 1 1 1  0 0  Comment None new since last reported 2 weeks ago. - - - -  Number falls in past yr: - - 0 - -  Injury with Fall? 1 1 1  - -  Comment - Left Hip fracture - - -  Risk for fall due to : - - - - -  Follow up Falls evaluation completed Falls evaluation completed Falls evaluation completed Falls evaluation completed Falls evaluation completed  Comment - - - - -   Is the patient's home free of loose throw rugs in walkways, pet beds, electrical cords, etc?  Yes      Grab bars in the bathroom? Yes      Handrails on the stairs?  Yes      Adequate lighting? Yes   Timed Get Up and Go performed: No, virtual visit  Depression Screen PHQ 2/9 Scores 10/28/2019 01/06/2019 11/30/2018 10/27/2018  PHQ - 2 Score 0 0 0 0  PHQ- 9 Score - 0 0 -     Cognitive Function Patient is alert and oriented x3. Patient denies difficulty focusing or concentrating. Patient likes to play bridge brain health.   MMSE - Mini Mental  State Exam 08/01/2015  Orientation to time 5  Orientation to Place 5  Registration 3  Attention/ Calculation 5  Recall 3  Language- name 2 objects 2  Language- repeat 1  Language- follow 3 step command 3  Language- read & follow direction 1  Write a sentence 1  Copy design 1  Total score 30     6CIT Screen 10/28/2019 10/27/2018 10/16/2017 10/14/2016  What Year? 0 points 0 points 0 points 0 points  What month? 0 points 0 points 0 points 0 points  What time? 0 points 0 points 0 points 0 points  Count back from 20 0 points 0 points 0 points 0 points  Months in reverse 0 points 0 points 0 points 0 points  Repeat phrase 4 points 0 points - 0 points  Total Score 4 0 - 0    Immunization History  Administered Date(s) Administered  . Fluad Quad(high Dose 65+) 02/04/2019  . Influenza Whole 02/28/2013  . Influenza,inj,Quad PF,6+ Mos 01/29/2015  . Influenza-Unspecified 02/19/2010, 03/17/2012, 03/02/2014, 02/09/2016, 01/25/2017, 03/12/2018  . PFIZER SARS-COV-2 Vaccination 06/01/2019, 06/22/2019  . Pneumococcal Conjugate-13 09/01/2013  . Pneumococcal Polysaccharide-23 04/07/2017  . Pneumococcal-Unspecified 04/15/2007  . Tdap 11/11/2011  . Zoster 08/31/2009, 10/12/2011   Screening Tests Health Maintenance  Topic Date Due  . OPHTHALMOLOGY EXAM  09/24/2018  .  URINE MICROALBUMIN  04/13/2019  . FOOT EXAM  07/15/2019  . INFLUENZA VACCINE  12/18/2019  . HEMOGLOBIN A1C  03/30/2020  . TETANUS/TDAP  11/10/2021  . DEXA SCAN  Completed  . COVID-19 Vaccine  Completed  . PNA vac Low Risk Adult  Completed      Plan:  Keep all routine maintenance appointments.   Medicare Attestation I have personally reviewed: The patient's medical and social history Their use of alcohol, tobacco or illicit drugs Their current medications and supplements The patient's functional ability including ADLs,fall risks, home safety risks, cognitive, and hearing and visual impairment Diet and physical  activities Evidence for depression   I have reviewed and discussed with patient certain preventive protocols, quality metrics, and best practice recommendations.     OBrien-Blaney, Jaquille Kau L, LPN  [id number]     I have reviewed the above information and agree with above.   Deborra Medina, MD

## 2019-10-28 NOTE — Patient Instructions (Addendum)
  Kara [name] , Thank you for taking time to come for your Medicare Wellness Visit. I appreciate your ongoing commitment to your health goals. Please review the following plan we discussed and let me know if I can assist you in the future.   These are the goals we discussed: Goals      Patient Stated   .  DIET - REDUCE SUGAR INTAKE (pt-stated)      Monitor diet       This is a list of the screening recommended for you and due dates:  Health Maintenance  Topic Date Due  . Eye exam for diabetics  09/24/2018  . Urine Protein Check  04/13/2019  . Complete foot exam   07/15/2019  . Flu Shot  12/18/2019  . Hemoglobin A1C  03/30/2020  . Tetanus Vaccine  11/10/2021  . DEXA scan (bone density measurement)  Completed  . COVID-19 Vaccine  Completed  . Pneumonia vaccines  Completed

## 2019-10-31 ENCOUNTER — Other Ambulatory Visit: Payer: Self-pay | Admitting: Internal Medicine

## 2019-10-31 DIAGNOSIS — G5601 Carpal tunnel syndrome, right upper limb: Secondary | ICD-10-CM | POA: Diagnosis not present

## 2019-10-31 DIAGNOSIS — S72142D Displaced intertrochanteric fracture of left femur, subsequent encounter for closed fracture with routine healing: Secondary | ICD-10-CM | POA: Diagnosis not present

## 2019-10-31 DIAGNOSIS — M25541 Pain in joints of right hand: Secondary | ICD-10-CM | POA: Diagnosis not present

## 2019-10-31 DIAGNOSIS — M25552 Pain in left hip: Secondary | ICD-10-CM | POA: Diagnosis not present

## 2019-10-31 DIAGNOSIS — M25542 Pain in joints of left hand: Secondary | ICD-10-CM | POA: Diagnosis not present

## 2019-10-31 DIAGNOSIS — Z9181 History of falling: Secondary | ICD-10-CM | POA: Diagnosis not present

## 2019-11-02 DIAGNOSIS — M25541 Pain in joints of right hand: Secondary | ICD-10-CM | POA: Diagnosis not present

## 2019-11-02 DIAGNOSIS — Z9181 History of falling: Secondary | ICD-10-CM | POA: Diagnosis not present

## 2019-11-02 DIAGNOSIS — M25552 Pain in left hip: Secondary | ICD-10-CM | POA: Diagnosis not present

## 2019-11-02 DIAGNOSIS — G5601 Carpal tunnel syndrome, right upper limb: Secondary | ICD-10-CM | POA: Diagnosis not present

## 2019-11-02 DIAGNOSIS — M25542 Pain in joints of left hand: Secondary | ICD-10-CM | POA: Diagnosis not present

## 2019-11-02 DIAGNOSIS — S72142D Displaced intertrochanteric fracture of left femur, subsequent encounter for closed fracture with routine healing: Secondary | ICD-10-CM | POA: Diagnosis not present

## 2019-11-07 DIAGNOSIS — S72142D Displaced intertrochanteric fracture of left femur, subsequent encounter for closed fracture with routine healing: Secondary | ICD-10-CM | POA: Diagnosis not present

## 2019-11-07 DIAGNOSIS — M25541 Pain in joints of right hand: Secondary | ICD-10-CM | POA: Diagnosis not present

## 2019-11-07 DIAGNOSIS — Z9181 History of falling: Secondary | ICD-10-CM | POA: Diagnosis not present

## 2019-11-07 DIAGNOSIS — M25552 Pain in left hip: Secondary | ICD-10-CM | POA: Diagnosis not present

## 2019-11-07 DIAGNOSIS — G5601 Carpal tunnel syndrome, right upper limb: Secondary | ICD-10-CM | POA: Diagnosis not present

## 2019-11-07 DIAGNOSIS — M25542 Pain in joints of left hand: Secondary | ICD-10-CM | POA: Diagnosis not present

## 2019-11-08 DIAGNOSIS — S72142D Displaced intertrochanteric fracture of left femur, subsequent encounter for closed fracture with routine healing: Secondary | ICD-10-CM | POA: Diagnosis not present

## 2019-11-08 DIAGNOSIS — M25542 Pain in joints of left hand: Secondary | ICD-10-CM | POA: Diagnosis not present

## 2019-11-08 DIAGNOSIS — Z9181 History of falling: Secondary | ICD-10-CM | POA: Diagnosis not present

## 2019-11-08 DIAGNOSIS — G5601 Carpal tunnel syndrome, right upper limb: Secondary | ICD-10-CM | POA: Diagnosis not present

## 2019-11-08 DIAGNOSIS — M25552 Pain in left hip: Secondary | ICD-10-CM | POA: Diagnosis not present

## 2019-11-08 DIAGNOSIS — M25541 Pain in joints of right hand: Secondary | ICD-10-CM | POA: Diagnosis not present

## 2019-11-09 DIAGNOSIS — M25542 Pain in joints of left hand: Secondary | ICD-10-CM | POA: Diagnosis not present

## 2019-11-09 DIAGNOSIS — M25541 Pain in joints of right hand: Secondary | ICD-10-CM | POA: Diagnosis not present

## 2019-11-09 DIAGNOSIS — G5601 Carpal tunnel syndrome, right upper limb: Secondary | ICD-10-CM | POA: Diagnosis not present

## 2019-11-09 DIAGNOSIS — Z9181 History of falling: Secondary | ICD-10-CM | POA: Diagnosis not present

## 2019-11-09 DIAGNOSIS — S72142D Displaced intertrochanteric fracture of left femur, subsequent encounter for closed fracture with routine healing: Secondary | ICD-10-CM | POA: Diagnosis not present

## 2019-11-09 DIAGNOSIS — M25552 Pain in left hip: Secondary | ICD-10-CM | POA: Diagnosis not present

## 2019-11-14 DIAGNOSIS — S72142D Displaced intertrochanteric fracture of left femur, subsequent encounter for closed fracture with routine healing: Secondary | ICD-10-CM | POA: Diagnosis not present

## 2019-11-14 DIAGNOSIS — M25542 Pain in joints of left hand: Secondary | ICD-10-CM | POA: Diagnosis not present

## 2019-11-14 DIAGNOSIS — M25541 Pain in joints of right hand: Secondary | ICD-10-CM | POA: Diagnosis not present

## 2019-11-14 DIAGNOSIS — G5601 Carpal tunnel syndrome, right upper limb: Secondary | ICD-10-CM | POA: Diagnosis not present

## 2019-11-14 DIAGNOSIS — M25552 Pain in left hip: Secondary | ICD-10-CM | POA: Diagnosis not present

## 2019-11-14 DIAGNOSIS — Z9181 History of falling: Secondary | ICD-10-CM | POA: Diagnosis not present

## 2019-11-15 DIAGNOSIS — M25542 Pain in joints of left hand: Secondary | ICD-10-CM | POA: Diagnosis not present

## 2019-11-15 DIAGNOSIS — M25552 Pain in left hip: Secondary | ICD-10-CM | POA: Diagnosis not present

## 2019-11-15 DIAGNOSIS — Z9181 History of falling: Secondary | ICD-10-CM | POA: Diagnosis not present

## 2019-11-15 DIAGNOSIS — S72142D Displaced intertrochanteric fracture of left femur, subsequent encounter for closed fracture with routine healing: Secondary | ICD-10-CM | POA: Diagnosis not present

## 2019-11-15 DIAGNOSIS — G5601 Carpal tunnel syndrome, right upper limb: Secondary | ICD-10-CM | POA: Diagnosis not present

## 2019-11-15 DIAGNOSIS — M25541 Pain in joints of right hand: Secondary | ICD-10-CM | POA: Diagnosis not present

## 2019-11-16 DIAGNOSIS — Z9181 History of falling: Secondary | ICD-10-CM | POA: Diagnosis not present

## 2019-11-16 DIAGNOSIS — M25541 Pain in joints of right hand: Secondary | ICD-10-CM | POA: Diagnosis not present

## 2019-11-16 DIAGNOSIS — G5601 Carpal tunnel syndrome, right upper limb: Secondary | ICD-10-CM | POA: Diagnosis not present

## 2019-11-16 DIAGNOSIS — S72142D Displaced intertrochanteric fracture of left femur, subsequent encounter for closed fracture with routine healing: Secondary | ICD-10-CM | POA: Diagnosis not present

## 2019-11-16 DIAGNOSIS — M25552 Pain in left hip: Secondary | ICD-10-CM | POA: Diagnosis not present

## 2019-11-16 DIAGNOSIS — M25542 Pain in joints of left hand: Secondary | ICD-10-CM | POA: Diagnosis not present

## 2019-11-28 DIAGNOSIS — M1990 Unspecified osteoarthritis, unspecified site: Secondary | ICD-10-CM | POA: Diagnosis not present

## 2019-11-28 DIAGNOSIS — G5601 Carpal tunnel syndrome, right upper limb: Secondary | ICD-10-CM | POA: Diagnosis not present

## 2019-11-28 DIAGNOSIS — Z9181 History of falling: Secondary | ICD-10-CM | POA: Diagnosis not present

## 2019-11-28 DIAGNOSIS — M25552 Pain in left hip: Secondary | ICD-10-CM | POA: Diagnosis not present

## 2019-11-28 DIAGNOSIS — M6281 Muscle weakness (generalized): Secondary | ICD-10-CM | POA: Diagnosis not present

## 2019-11-28 DIAGNOSIS — M25541 Pain in joints of right hand: Secondary | ICD-10-CM | POA: Diagnosis not present

## 2019-11-28 DIAGNOSIS — M25542 Pain in joints of left hand: Secondary | ICD-10-CM | POA: Diagnosis not present

## 2019-11-28 DIAGNOSIS — S72142D Displaced intertrochanteric fracture of left femur, subsequent encounter for closed fracture with routine healing: Secondary | ICD-10-CM | POA: Diagnosis not present

## 2019-11-28 DIAGNOSIS — R279 Unspecified lack of coordination: Secondary | ICD-10-CM | POA: Diagnosis not present

## 2019-11-28 DIAGNOSIS — R2689 Other abnormalities of gait and mobility: Secondary | ICD-10-CM | POA: Diagnosis not present

## 2019-11-29 ENCOUNTER — Ambulatory Visit (INDEPENDENT_AMBULATORY_CARE_PROVIDER_SITE_OTHER): Payer: Medicare Other | Admitting: Vascular Surgery

## 2019-11-29 ENCOUNTER — Encounter (INDEPENDENT_AMBULATORY_CARE_PROVIDER_SITE_OTHER): Payer: Self-pay | Admitting: Vascular Surgery

## 2019-11-29 ENCOUNTER — Other Ambulatory Visit: Payer: Self-pay

## 2019-11-29 ENCOUNTER — Ambulatory Visit (INDEPENDENT_AMBULATORY_CARE_PROVIDER_SITE_OTHER): Payer: Medicare Other

## 2019-11-29 VITALS — BP 152/76 | HR 87 | Ht 62.0 in | Wt 120.0 lb

## 2019-11-29 DIAGNOSIS — I739 Peripheral vascular disease, unspecified: Secondary | ICD-10-CM

## 2019-11-29 DIAGNOSIS — E1140 Type 2 diabetes mellitus with diabetic neuropathy, unspecified: Secondary | ICD-10-CM | POA: Diagnosis not present

## 2019-11-29 DIAGNOSIS — I10 Essential (primary) hypertension: Secondary | ICD-10-CM

## 2019-11-29 DIAGNOSIS — S72142D Displaced intertrochanteric fracture of left femur, subsequent encounter for closed fracture with routine healing: Secondary | ICD-10-CM | POA: Diagnosis not present

## 2019-11-29 DIAGNOSIS — M25541 Pain in joints of right hand: Secondary | ICD-10-CM | POA: Diagnosis not present

## 2019-11-29 DIAGNOSIS — M7989 Other specified soft tissue disorders: Secondary | ICD-10-CM

## 2019-11-29 DIAGNOSIS — Z9181 History of falling: Secondary | ICD-10-CM | POA: Diagnosis not present

## 2019-11-29 DIAGNOSIS — M25542 Pain in joints of left hand: Secondary | ICD-10-CM | POA: Diagnosis not present

## 2019-11-29 DIAGNOSIS — M25552 Pain in left hip: Secondary | ICD-10-CM | POA: Diagnosis not present

## 2019-11-29 DIAGNOSIS — G5601 Carpal tunnel syndrome, right upper limb: Secondary | ICD-10-CM | POA: Diagnosis not present

## 2019-11-29 NOTE — Assessment & Plan Note (Signed)
ABIs today are stable at 1.05 on the right and 0.69 on the left.  No limb threatening symptoms.  No intervention recommended at this time.  Would recommend follow-up in 1 year with noninvasive studies or sooner if problems develop in the interim.

## 2019-11-29 NOTE — Assessment & Plan Note (Signed)
Relatively stable.  Got a little worse after her hip fracture, but has been improving over the past several weeks.

## 2019-11-29 NOTE — Progress Notes (Signed)
MRN : [id number]  Kara [name] is a 86 y.o. (May 30, 1922) female who presents with chief complaint of  Chief Complaint  Patient presents with  . Follow-up    1 yr u/s follow up  .  History of Present Illness: Patient returns today in follow up of her PAD.  She is doing reasonably well.  She denies any current ulceration or infection. Six months ago, she fell and broke her hip and has had a prolonged recover but is getting back to her baseline.  Her swelling worsened, but has gotten close to her baseline. ABIs today are stable at 1.05 on the right and 0.69 on the left.   Current Outpatient Medications  Medication Sig Dispense Refill  . acetaminophen (TYLENOL) 325 MG tablet Take 2 tablets (650 mg total) by mouth every 6 (six) hours as needed for mild pain or moderate pain (pain score 1-3 or temp > 100.5).    Marland Kitchen apixaban (ELIQUIS) 2.5 MG TABS tablet Take 2.5 mg by mouth 2 (two) times daily.    . cholecalciferol (VITAMIN D) 1000 UNITS tablet Take 1,000 Units by mouth daily.    . clobetasol cream (TEMOVATE) 0.05 % Apply topically.    . Cranberry 200 MG CAPS Take 1 capsule (200 mg total) by mouth daily. 30 capsule 2  . cyanocobalamin (,VITAMIN B-12,) 1000 MCG/ML injection INJECT 1ML INTO THE MUSCLE EVERY 14 DAYS 10 mL 0  . diphenoxylate-atropine (LOMOTIL) 2.5-0.025 MG tablet TAKE ONE TABLET BY MOUTH 3 times daily AS NEEDED FOR DIARRHEA 90 tablet 3  . estradiol (ESTRACE VAGINAL) 0.1 MG/GM vaginal cream Place vaginally.    . metoprolol succinate (TOPROL-XL) 25 MG 24 hr tablet TAKE 1 TALET BY MOUTH AT BEDTIME 90 tablet 1  . triamcinolone cream (KENALOG) 0.1 % Apply 1 application topically 2 (two) times daily. To vulva until itching resolves,  Then reduce use to twice weekly (Patient taking differently: Apply 1 application topically 2 (two) times a week. ) 30 g 0   No current facility-administered medications for this visit.    Past Medical History:  Diagnosis Date  . Allergy   . Atrial  fibrillation ([place])   . CAD (coronary artery disease)   . Cancer (HCC)    UTERINE  . Chronic anticoagulation   . Chronic diarrhea   . Decubitus ulcer    sacral region  . Diabetes ([place])    diet controlled  . Dysuria   . Edema of lower extremity    mainly right foot, slightly in left foot.  . Fibrocystic breast disease   . GERD (gastroesophageal reflux disease)   . Glaucoma   . Glaucoma   . Hematuria   . Hemorrhoids   . History of colon polyps   . History of pancreatitis   . Hyperlipidemia   . Hypertension   . Hypokalemia   . IBS (irritable bowel syndrome)   . Microscopic hematuria   . Mitral valve regurgitation   . Osteoarthritis    fingers  . Pernicious anemia   . Plantar fasciitis   . Recurrent UTI   . Skin cancer   . Sleep apnea, obstructive   . Vaginal atrophy   . Vitamin D deficiency   . Yeast vaginitis     Past Surgical History:  Procedure Laterality Date  . ABDOMINAL HYSTERECTOMY  1980's  . ABDOMINAL SURGERY     for villous polyp,,,many years ago  . APPENDECTOMY  1940's  . ASCAD, s/p PTCA  11/28/2005   MID  LESION   . BREAST BIOPSY Left 1970's  . CARPAL TUNNEL RELEASE Right 12/13/2015   Procedure: CARPAL TUNNEL RELEASE;  Surgeon: Thornton Park, MD;  Location: [facility] ORS;  Service: Orthopedics;  Laterality: Right;  . COLECTOMY  2015  . INTRAMEDULLARY (IM) NAIL INTERTROCHANTERIC Left 06/11/2019   Procedure: INTRAMEDULLARY (IM) NAIL INTERTROCHANTRIC;  Surgeon: Earnestine Leys, MD;  Location: [facility] ORS;  Service: Orthopedics;  Laterality: Left;  . PACEMAKER PLACEMENT    . radation     for uterine cance  . REFRACTIVE SURGERY     for bilateral glaumoma  . TONSILLECTOMY  1936     Social History   Tobacco Use  . Smoking status: Former Research scientist (life sciences)  . Smokeless tobacco: Never Used  . Tobacco comment: quit 45 years ago  Vaping Use  . Vaping Use: Never used  Substance Use Topics  . Alcohol use: Yes    Alcohol/week: 0.0 standard drinks    Comment: Rarely, 1-2  times a year  . Drug use: No      Family History  Problem Relation Age of Onset  . Coronary artery disease Father   . Kidney disease Father   . Cancer Sister   . Diabetes Other   . Cancer Mother        COLON  . Bladder Cancer Neg Hx   . Breast cancer Neg Hx      Allergies  Allergen Reactions  . Baycol [Cerivastatin Sodium]   . Cardizem [Diltiazem Hcl]     LOWER EXTREMITY EDEMA  . Crestor [Rosuvastatin Calcium] Other (See Comments)    INTOLERANT  . Dronedarone Other (See Comments)    Fatigue  . Metoprolol Tartrate Other (See Comments)  . Omeprazole Other (See Comments)  . Pravachol     INTOLERANT  . Statins Other (See Comments)  . Vioxx [Rofecoxib]   . Zocor [Simvastatin]     INTOLERANT     REVIEW OF SYSTEMS (Negative unless checked)  Constitutional: [] ?Weight loss  [] ?Fever  [] ?Chills Cardiac: [] ?Chest pain   [] ?Chest pressure   [x] ?Palpitations   [] ?Shortness of breath when laying flat   [] ?Shortness of breath at rest   [x] ?Shortness of breath with exertion. Vascular:  [] ?Pain in legs with walking   [] ?Pain in legs at rest   [] ?Pain in legs when laying flat   [x] ?Claudication   [] ?Pain in feet when walking  [] ?Pain in feet at rest  [] ?Pain in feet when laying flat   [] ?History of DVT   [] ?Phlebitis   [x] ?Swelling in legs   [] ?Varicose veins   [] ?Non-healing ulcers Pulmonary:   [] ?Uses home oxygen   [] ?Productive cough   [] ?Hemoptysis   [] ?Wheeze  [] ?COPD   [] ?Asthma Neurologic:  [] ?Dizziness  [] ?Blackouts   [] ?Seizures   [] ?History of stroke   [] ?History of TIA  [] ?Aphasia   [] ?Temporary blindness   [] ?Dysphagia   [] ?Weakness or numbness in arms   [] ?Weakness or numbness in legs Musculoskeletal:  [x] ?Arthritis   [] ?Joint swelling   [] ?Joint pain   [] ?Low back pain Hematologic:  [] ?Easy bruising  [] ?Easy bleeding   [] ?Hypercoagulable state   [] ?Anemic   Gastrointestinal:  [] ?Blood in stool   [] ?Vomiting blood  [] ?Gastroesophageal reflux/heartburn   [] ?Abdominal  pain Genitourinary:  [] ?Chronic kidney disease   [] ?Difficult urination  [] ?Frequent urination  [] ?Burning with urination   [] ?Hematuria Skin:  [] ?Rashes   [] ?Ulcers   [] ?Wounds Psychological:  [] ?History of anxiety   [] ? History of major depression.  Physical Examination  BP (!) 152/76  Pulse 87   Ht 5\' 2"  (1.575 m)   Wt 120 lb (54.4 kg)   BMI 21.95 kg/m  Gen:  WD/WN, NAD.  Appears younger than stated age Head: [place]/AT, No temporalis wasting. Ear/Nose/Throat: Hearing grossly intact, nares w/o erythema or drainage Eyes: Conjunctiva clear. Sclera non-icteric Neck: Supple.  Trachea midline Pulmonary:  Good air movement, no use of accessory muscles.  Cardiac: Irregular Vascular:  Vessel Right Left  Radial Palpable Palpable                          PT 1+ Palpable 1+ Palpable  DP Palpable 1+ Palpable   Gastrointestinal: soft, non-tender/non-distended. No guarding/reflex.  Musculoskeletal: M/S 5/5 throughout.  No deformity or atrophy.  1+ right lower extremity edema, trace left lower extremity edema.  Walks with a cane. Neurologic: Sensation grossly intact in extremities.  Symmetrical.  Speech is fluent.  Psychiatric: Judgment intact, Mood & affect appropriate for pt's clinical situation. Dermatologic: No rashes or ulcers noted.  No cellulitis or open wounds.       Labs Recent Results (from the past 2160 hour(s))  CBC with Differential/Platelet     Status: None   Collection Time: 09/28/19 11:22 AM  Result Value Ref Range   WBC 6.7 4.0 - 10.5 K/uL   RBC 4.55 3.87 - 5.11 Mil/uL   Hemoglobin 13.9 12.0 - 15.0 g/dL   HCT 41.6 36 - 46 %   MCV 91.4 78.0 - 100.0 fl   MCHC 33.5 30.0 - 36.0 g/dL   RDW 13.8 11.5 - 15.5 %   Platelets 167.0 150 - 400 K/uL   Neutrophils Relative % 52.6 43 - 77 %   Lymphocytes Relative 40.4 12 - 46 %   Monocytes Relative 5.7 3 - 12 %   Eosinophils Relative 0.9 0 - 5 %   Basophils Relative 0.4 0 - 3 %   Neutro Abs 3.5 1.4 - 7.7 K/uL   Lymphs  Abs 2.7 0.7 - 4.0 K/uL   Monocytes Absolute 0.4 0 - 1 K/uL   Eosinophils Absolute 0.1 0 - 0 K/uL   Basophils Absolute 0.0 0 - 0 K/uL  IBC + Ferritin     Status: None   Collection Time: 09/28/19 11:22 AM  Result Value Ref Range   Iron 104 42 - 145 ug/dL   Transferrin 275.0 212.0 - 360.0 mg/dL   Saturation Ratios 27.0 20.0 - 50.0 %   Ferritin 49.7 10.0 - 291.0 ng/mL  Vitamin B12     Status: None   Collection Time: 09/28/19 11:22 AM  Result Value Ref Range   Vitamin B-12 542 211 - 911 pg/mL  Hemoglobin A1c     Status: Abnormal   Collection Time: 09/28/19 11:22 AM  Result Value Ref Range   Hgb A1c MFr Bld 6.8 (H) 4.6 - 6.5 %    Comment: Glycemic Control Guidelines for People with Diabetes:Non Diabetic:  <6%Goal of Therapy: <7%Additional Action Suggested:  >8%   Comprehensive metabolic panel     Status: Abnormal   Collection Time: 09/28/19 11:22 AM  Result Value Ref Range   Sodium 138 135 - 145 mEq/L   Potassium 3.6 3.5 - 5.1 mEq/L   Chloride 105 96 - 112 mEq/L   CO2 26 19 - 32 mEq/L   Glucose, Bld 136 (H) 70 - 99 mg/dL   BUN 18 6 - 23 mg/dL   Creatinine, Ser 0.90 0.40 - 1.20 mg/dL   Total Bilirubin 1.2  0.2 - 1.2 mg/dL   Alkaline Phosphatase 81 39 - 117 U/L   AST 20 0 - 37 U/L   ALT 16 0 - 35 U/L   Total Protein 6.7 6.0 - 8.3 g/dL   Albumin 3.8 3.5 - 5.2 g/dL   GFR 58.18 (L) >60.00 mL/min   Calcium 9.2 8.4 - 10.5 mg/dL    Radiology No results found.  Assessment/Plan Essential hypertension blood pressure control important in reducing the progression of atherosclerotic disease. On appropriate oral medications.   Type 2 diabetes mellitus with diabetic neuropathy, unspecified (HCC) blood glucose control important in reducing the progression of atherosclerotic disease. Also, involved in wound healing. On appropriate medications.  We discussed medicines for neuropathy but she had an adverse reaction when she was in rehab after her hip fracture so I think avoiding these would  likely be in her best interest.  Leg swelling Relatively stable.  Got a little worse after her hip fracture, but has been improving over the past several weeks.  Peripheral artery disease (HCC) ABIs today are stable at 1.05 on the right and 0.69 on the left.  No limb threatening symptoms.  No intervention recommended at this time.  Would recommend follow-up in 1 year with noninvasive studies or sooner if problems develop in the interim.    Leotis Pain, MD  11/29/2019 12:00 PM    This note was created with Dragon medical transcription system.  Any errors from dictation are purely unintentional

## 2019-11-30 DIAGNOSIS — S72142D Displaced intertrochanteric fracture of left femur, subsequent encounter for closed fracture with routine healing: Secondary | ICD-10-CM | POA: Diagnosis not present

## 2019-11-30 DIAGNOSIS — Z9181 History of falling: Secondary | ICD-10-CM | POA: Diagnosis not present

## 2019-11-30 DIAGNOSIS — M25552 Pain in left hip: Secondary | ICD-10-CM | POA: Diagnosis not present

## 2019-11-30 DIAGNOSIS — M25542 Pain in joints of left hand: Secondary | ICD-10-CM | POA: Diagnosis not present

## 2019-11-30 DIAGNOSIS — M25541 Pain in joints of right hand: Secondary | ICD-10-CM | POA: Diagnosis not present

## 2019-11-30 DIAGNOSIS — G5601 Carpal tunnel syndrome, right upper limb: Secondary | ICD-10-CM | POA: Diagnosis not present

## 2019-12-05 DIAGNOSIS — G5601 Carpal tunnel syndrome, right upper limb: Secondary | ICD-10-CM | POA: Diagnosis not present

## 2019-12-05 DIAGNOSIS — Z9181 History of falling: Secondary | ICD-10-CM | POA: Diagnosis not present

## 2019-12-05 DIAGNOSIS — M25542 Pain in joints of left hand: Secondary | ICD-10-CM | POA: Diagnosis not present

## 2019-12-05 DIAGNOSIS — M25541 Pain in joints of right hand: Secondary | ICD-10-CM | POA: Diagnosis not present

## 2019-12-05 DIAGNOSIS — S72142D Displaced intertrochanteric fracture of left femur, subsequent encounter for closed fracture with routine healing: Secondary | ICD-10-CM | POA: Diagnosis not present

## 2019-12-05 DIAGNOSIS — M25552 Pain in left hip: Secondary | ICD-10-CM | POA: Diagnosis not present

## 2019-12-06 DIAGNOSIS — M25541 Pain in joints of right hand: Secondary | ICD-10-CM | POA: Diagnosis not present

## 2019-12-06 DIAGNOSIS — S72142D Displaced intertrochanteric fracture of left femur, subsequent encounter for closed fracture with routine healing: Secondary | ICD-10-CM | POA: Diagnosis not present

## 2019-12-06 DIAGNOSIS — M25552 Pain in left hip: Secondary | ICD-10-CM | POA: Diagnosis not present

## 2019-12-06 DIAGNOSIS — M25542 Pain in joints of left hand: Secondary | ICD-10-CM | POA: Diagnosis not present

## 2019-12-06 DIAGNOSIS — Z9181 History of falling: Secondary | ICD-10-CM | POA: Diagnosis not present

## 2019-12-06 DIAGNOSIS — G5601 Carpal tunnel syndrome, right upper limb: Secondary | ICD-10-CM | POA: Diagnosis not present

## 2019-12-07 DIAGNOSIS — S72142D Displaced intertrochanteric fracture of left femur, subsequent encounter for closed fracture with routine healing: Secondary | ICD-10-CM | POA: Diagnosis not present

## 2019-12-07 DIAGNOSIS — M25541 Pain in joints of right hand: Secondary | ICD-10-CM | POA: Diagnosis not present

## 2019-12-07 DIAGNOSIS — M25552 Pain in left hip: Secondary | ICD-10-CM | POA: Diagnosis not present

## 2019-12-07 DIAGNOSIS — M25542 Pain in joints of left hand: Secondary | ICD-10-CM | POA: Diagnosis not present

## 2019-12-07 DIAGNOSIS — G5601 Carpal tunnel syndrome, right upper limb: Secondary | ICD-10-CM | POA: Diagnosis not present

## 2019-12-07 DIAGNOSIS — Z9181 History of falling: Secondary | ICD-10-CM | POA: Diagnosis not present

## 2019-12-12 DIAGNOSIS — Z9181 History of falling: Secondary | ICD-10-CM | POA: Diagnosis not present

## 2019-12-12 DIAGNOSIS — M25541 Pain in joints of right hand: Secondary | ICD-10-CM | POA: Diagnosis not present

## 2019-12-12 DIAGNOSIS — S72142D Displaced intertrochanteric fracture of left femur, subsequent encounter for closed fracture with routine healing: Secondary | ICD-10-CM | POA: Diagnosis not present

## 2019-12-12 DIAGNOSIS — M25552 Pain in left hip: Secondary | ICD-10-CM | POA: Diagnosis not present

## 2019-12-12 DIAGNOSIS — G5601 Carpal tunnel syndrome, right upper limb: Secondary | ICD-10-CM | POA: Diagnosis not present

## 2019-12-12 DIAGNOSIS — M25542 Pain in joints of left hand: Secondary | ICD-10-CM | POA: Diagnosis not present

## 2019-12-13 DIAGNOSIS — G5601 Carpal tunnel syndrome, right upper limb: Secondary | ICD-10-CM | POA: Diagnosis not present

## 2019-12-13 DIAGNOSIS — Z9181 History of falling: Secondary | ICD-10-CM | POA: Diagnosis not present

## 2019-12-13 DIAGNOSIS — M25542 Pain in joints of left hand: Secondary | ICD-10-CM | POA: Diagnosis not present

## 2019-12-13 DIAGNOSIS — M25541 Pain in joints of right hand: Secondary | ICD-10-CM | POA: Diagnosis not present

## 2019-12-13 DIAGNOSIS — S72142D Displaced intertrochanteric fracture of left femur, subsequent encounter for closed fracture with routine healing: Secondary | ICD-10-CM | POA: Diagnosis not present

## 2019-12-13 DIAGNOSIS — M25552 Pain in left hip: Secondary | ICD-10-CM | POA: Diagnosis not present

## 2019-12-14 DIAGNOSIS — Z9181 History of falling: Secondary | ICD-10-CM | POA: Diagnosis not present

## 2019-12-14 DIAGNOSIS — M25542 Pain in joints of left hand: Secondary | ICD-10-CM | POA: Diagnosis not present

## 2019-12-14 DIAGNOSIS — M25541 Pain in joints of right hand: Secondary | ICD-10-CM | POA: Diagnosis not present

## 2019-12-14 DIAGNOSIS — S72142D Displaced intertrochanteric fracture of left femur, subsequent encounter for closed fracture with routine healing: Secondary | ICD-10-CM | POA: Diagnosis not present

## 2019-12-14 DIAGNOSIS — M25552 Pain in left hip: Secondary | ICD-10-CM | POA: Diagnosis not present

## 2019-12-14 DIAGNOSIS — G5601 Carpal tunnel syndrome, right upper limb: Secondary | ICD-10-CM | POA: Diagnosis not present

## 2019-12-19 DIAGNOSIS — M25541 Pain in joints of right hand: Secondary | ICD-10-CM | POA: Diagnosis not present

## 2019-12-19 DIAGNOSIS — M25542 Pain in joints of left hand: Secondary | ICD-10-CM | POA: Diagnosis not present

## 2019-12-19 DIAGNOSIS — M25552 Pain in left hip: Secondary | ICD-10-CM | POA: Diagnosis not present

## 2019-12-19 DIAGNOSIS — G5601 Carpal tunnel syndrome, right upper limb: Secondary | ICD-10-CM | POA: Diagnosis not present

## 2019-12-19 DIAGNOSIS — R279 Unspecified lack of coordination: Secondary | ICD-10-CM | POA: Diagnosis not present

## 2019-12-19 DIAGNOSIS — M6281 Muscle weakness (generalized): Secondary | ICD-10-CM | POA: Diagnosis not present

## 2019-12-19 DIAGNOSIS — M1990 Unspecified osteoarthritis, unspecified site: Secondary | ICD-10-CM | POA: Diagnosis not present

## 2019-12-19 DIAGNOSIS — R2689 Other abnormalities of gait and mobility: Secondary | ICD-10-CM | POA: Diagnosis not present

## 2019-12-19 DIAGNOSIS — Z9181 History of falling: Secondary | ICD-10-CM | POA: Diagnosis not present

## 2019-12-19 DIAGNOSIS — S72142D Displaced intertrochanteric fracture of left femur, subsequent encounter for closed fracture with routine healing: Secondary | ICD-10-CM | POA: Diagnosis not present

## 2019-12-20 DIAGNOSIS — G5601 Carpal tunnel syndrome, right upper limb: Secondary | ICD-10-CM | POA: Diagnosis not present

## 2019-12-20 DIAGNOSIS — Z9181 History of falling: Secondary | ICD-10-CM | POA: Diagnosis not present

## 2019-12-20 DIAGNOSIS — M25552 Pain in left hip: Secondary | ICD-10-CM | POA: Diagnosis not present

## 2019-12-20 DIAGNOSIS — M25542 Pain in joints of left hand: Secondary | ICD-10-CM | POA: Diagnosis not present

## 2019-12-20 DIAGNOSIS — M25541 Pain in joints of right hand: Secondary | ICD-10-CM | POA: Diagnosis not present

## 2019-12-20 DIAGNOSIS — S72142D Displaced intertrochanteric fracture of left femur, subsequent encounter for closed fracture with routine healing: Secondary | ICD-10-CM | POA: Diagnosis not present

## 2019-12-21 DIAGNOSIS — Z9181 History of falling: Secondary | ICD-10-CM | POA: Diagnosis not present

## 2019-12-21 DIAGNOSIS — G5601 Carpal tunnel syndrome, right upper limb: Secondary | ICD-10-CM | POA: Diagnosis not present

## 2019-12-21 DIAGNOSIS — M25541 Pain in joints of right hand: Secondary | ICD-10-CM | POA: Diagnosis not present

## 2019-12-21 DIAGNOSIS — S72142D Displaced intertrochanteric fracture of left femur, subsequent encounter for closed fracture with routine healing: Secondary | ICD-10-CM | POA: Diagnosis not present

## 2019-12-21 DIAGNOSIS — M25552 Pain in left hip: Secondary | ICD-10-CM | POA: Diagnosis not present

## 2019-12-21 DIAGNOSIS — M25542 Pain in joints of left hand: Secondary | ICD-10-CM | POA: Diagnosis not present

## 2019-12-26 DIAGNOSIS — Z20828 Contact with and (suspected) exposure to other viral communicable diseases: Secondary | ICD-10-CM | POA: Diagnosis not present

## 2019-12-29 ENCOUNTER — Other Ambulatory Visit: Payer: Self-pay

## 2019-12-29 ENCOUNTER — Encounter: Payer: Self-pay | Admitting: Internal Medicine

## 2019-12-29 ENCOUNTER — Ambulatory Visit (INDEPENDENT_AMBULATORY_CARE_PROVIDER_SITE_OTHER): Payer: Medicare Other | Admitting: Internal Medicine

## 2019-12-29 VITALS — BP 128/66 | HR 82 | Temp 98.3°F | Resp 16 | Ht 62.0 in | Wt 126.4 lb

## 2019-12-29 DIAGNOSIS — E1140 Type 2 diabetes mellitus with diabetic neuropathy, unspecified: Secondary | ICD-10-CM

## 2019-12-29 DIAGNOSIS — D510 Vitamin B12 deficiency anemia due to intrinsic factor deficiency: Secondary | ICD-10-CM | POA: Diagnosis not present

## 2019-12-29 DIAGNOSIS — R600 Localized edema: Secondary | ICD-10-CM

## 2019-12-29 DIAGNOSIS — I872 Venous insufficiency (chronic) (peripheral): Secondary | ICD-10-CM | POA: Diagnosis not present

## 2019-12-29 DIAGNOSIS — I471 Supraventricular tachycardia: Secondary | ICD-10-CM | POA: Diagnosis not present

## 2019-12-29 DIAGNOSIS — R5383 Other fatigue: Secondary | ICD-10-CM

## 2019-12-29 DIAGNOSIS — I482 Chronic atrial fibrillation, unspecified: Secondary | ICD-10-CM

## 2019-12-29 LAB — COMPREHENSIVE METABOLIC PANEL
ALT: 15 U/L (ref 0–35)
AST: 21 U/L (ref 0–37)
Albumin: 4 g/dL (ref 3.5–5.2)
Alkaline Phosphatase: 66 U/L (ref 39–117)
BUN: 20 mg/dL (ref 6–23)
CO2: 25 mEq/L (ref 19–32)
Calcium: 9.2 mg/dL (ref 8.4–10.5)
Chloride: 104 mEq/L (ref 96–112)
Creatinine, Ser: 0.97 mg/dL (ref 0.40–1.20)
GFR: 53.33 mL/min — ABNORMAL LOW (ref >60.00)
Glucose, Bld: 149 mg/dL — ABNORMAL HIGH (ref 70–99)
Potassium: 3.5 mEq/L (ref 3.5–5.1)
Sodium: 139 mEq/L (ref 135–145)
Total Bilirubin: 2 mg/dL — ABNORMAL HIGH (ref 0.2–1.2)
Total Protein: 6.9 g/dL (ref 6.0–8.3)

## 2019-12-29 LAB — MICROALBUMIN / CREATININE URINE RATIO
Creatinine,U: 29.6 mg/dL
Microalb Creat Ratio: 3.4 mg/g (ref 0.0–30.0)
Microalb, Ur: 1 mg/dL (ref 0.0–1.9)

## 2019-12-29 LAB — TSH: TSH: 3.15 u[IU]/mL (ref 0.35–4.50)

## 2019-12-29 LAB — HEMOGLOBIN A1C: Hgb A1c MFr Bld: 6.3 % (ref 4.6–6.5)

## 2019-12-29 MED ORDER — METOPROLOL SUCCINATE ER 25 MG PO TB24: 25.0000 mg | ORAL_TABLET | Freq: Two times a day (BID) | ORAL | 1 refills | Status: DC

## 2019-12-29 NOTE — Patient Instructions (Addendum)
Your pulse is a little too fast  I recommend adding a morning dose of metoprolol.  Continue the evening dose

## 2019-12-29 NOTE — Progress Notes (Signed)
Subjective:  Patient ID: Kara [name], female    DOB: 02-08-1923  Age: 86 y.o. MRN: [id number]  CC: The primary encounter diagnosis was Type 2 diabetes mellitus with diabetic neuropathy, without long-term current use of insulin ([place]). Diagnoses of Edema of lower extremity, Pernicious anemia, Paroxysmal supraventricular tachycardia (HCC), Fatigue, unspecified type, Venous insufficiency of both lower extremities, and Chronic atrial fibrillation ([place]) were also pertinent to this visit.  HPI Kara [name] presents for follow up on type 2 DM atrial fibrillation with SSS , B12 deficiency , and other chronic issues  This visit occurred during the SARS-CoV-2 public health emergency.  Safety protocols were in place, including screening questions prior to the visit, additional usage of staff PPE, and extensive cleaning of exam room while observing appropriate contact time as indicated for disinfecting solutions.   Patient has suspended PT for a week due to reports of COVID 19 IN ONE  Of the PT's at [facility].  She has not been taold if she had direct contact with the infected therapist. She was covid negative and has received vaccination .  She has no symptoms of infection.  T2DM:     Shefeels generally well, is walking and having PT several times per week.  Does not check sugars as she has been diet controlled.  Has been eating more ice cream lately and mixing it with a high protein shake. Denies numbness, burning and tingling of extremities. Appetite is good.  She has gained 1.5 lbs since last visit with me.    Last eye exam over one year ago has appt in october   Taking lomotil prn diarrhea chronic  Has been having episodes of Fluttering in ear  Lasted  Most of the day    Outpatient Medications Prior to Visit  Medication Sig Dispense Refill  . acetaminophen (TYLENOL) 325 MG tablet Take 2 tablets (650 mg total) by mouth every 6 (six) hours as needed for mild pain or moderate pain (pain score 1-3 or  temp > 100.5).    Marland Kitchen apixaban (ELIQUIS) 2.5 MG TABS tablet Take 2.5 mg by mouth 2 (two) times daily.    . cholecalciferol (VITAMIN D) 1000 UNITS tablet Take 1,000 Units by mouth daily.    . clobetasol cream (TEMOVATE) 0.05 % Apply topically.    . Cranberry 200 MG CAPS Take 1 capsule (200 mg total) by mouth daily. 30 capsule 2  . cyanocobalamin (,VITAMIN B-12,) 1000 MCG/ML injection INJECT 1ML INTO THE MUSCLE EVERY 14 DAYS 10 mL 0  . diphenoxylate-atropine (LOMOTIL) 2.5-0.025 MG tablet TAKE ONE TABLET BY MOUTH 3 times daily AS NEEDED FOR DIARRHEA 90 tablet 3  . estradiol (ESTRACE VAGINAL) 0.1 MG/GM vaginal cream Place vaginally.    . triamcinolone cream (KENALOG) 0.1 % Apply 1 application topically 2 (two) times daily. To vulva until itching resolves,  Then reduce use to twice weekly (Patient taking differently: Apply 1 application topically 2 (two) times a week. ) 30 g 0  . metoprolol succinate (TOPROL-XL) 25 MG 24 hr tablet TAKE 1 TALET BY MOUTH AT BEDTIME 90 tablet 1   No facility-administered medications prior to visit.    Review of Systems;  Patient denies headache, fevers, malaise, unintentional weight loss, skin rash, eye pain, sinus congestion and sinus pain, sore throat, dysphagia,  hemoptysis , cough, dyspnea, wheezing, chest pain, palpitations, orthopnea, edema, abdominal pain, nausea, melena, diarrhea, constipation, flank pain, dysuria, hematuria, urinary  Frequency, nocturia, numbness, tingling, seizures,  Focal weakness, Loss of consciousness,  Tremor, insomnia, depression, anxiety, and suicidal ideation.      Objective:  BP 128/66 (BP Location: Left Arm, Patient Position: Sitting, Cuff Size: Normal)   Pulse 82   Temp 98.3 F (36.8 C) (Oral)   Resp 16   Ht 5\' 2"  (1.575 m)   Wt 126 lb 6.4 oz (57.3 kg)   SpO2 96%   BMI 23.12 kg/m   BP Readings from Last 3 Encounters:  12/29/19 128/66  11/29/19 (!) 152/76  09/28/19 125/64    Wt Readings from Last 3 Encounters:    12/29/19 126 lb 6.4 oz (57.3 kg)  11/29/19 120 lb (54.4 kg)  10/28/19 125 lb (56.7 kg)    General appearance: alert, cooperative and appears stated age Ears: normal TM's and external ear canals both ears Throat: lips, mucosa, and tongue normal; teeth and gums normal Neck: no adenopathy, no carotid bruit, supple, symmetrical, trachea midline and thyroid not enlarged, symmetric, no tenderness/mass/nodules Back: symmetric, no curvature. ROM normal. No CVA tenderness. Lungs: clear to auscultation bilaterally Heart: irreg irreg and rapid  S1, S2 normal, no murmur, click, rub or gallop Abdomen: soft, non-tender; bowel sounds normal; no masses,  no organomegaly Pulses: 2+ and symmetric Skin: Skin color, texture, turgor normal. No rashes or lesions Lymph nodes: Cervical, supraclavicular, and axillary nodes normal. Ext:  Right leg edema (chornic, nonpitting).  Left leg normal.  No cyanosis, ulcerations or bruising   Lab Results  Component Value Date   HGBA1C 6.8 (H) 09/28/2019   HGBA1C 7.4 (H) 02/04/2019   HGBA1C 6.6 (H) 04/12/2018    Lab Results  Component Value Date   CREATININE 0.90 09/28/2019   CREATININE 0.85 06/13/2019   CREATININE 0.82 06/12/2019    Lab Results  Component Value Date   WBC 6.7 09/28/2019   HGB 13.9 09/28/2019   HCT 41.6 09/28/2019   PLT 167.0 09/28/2019   GLUCOSE 136 (H) 09/28/2019   CHOL 142 10/11/2013   TRIG 194.0 (H) 10/11/2013   HDL 43.10 10/11/2013   LDLDIRECT 86.3 06/30/2012   LDLCALC 60 10/11/2013   ALT 16 09/28/2019   AST 20 09/28/2019   NA 138 09/28/2019   K 3.6 09/28/2019   CL 105 09/28/2019   CREATININE 0.90 09/28/2019   BUN 18 09/28/2019   CO2 26 09/28/2019   TSH 3.35 04/07/2017   INR 1.2 06/09/2019   HGBA1C 6.8 (H) 09/28/2019   MICROALBUR 0.9 04/12/2018    No results found.  Assessment & Plan:   Problem List Items Addressed This Visit      Unprioritized   Atrial fibrillation ([place]) (Chronic)    Increasing metoprolol today  to bid for management of tachycardia.  Continue Eliquis       Relevant Medications   metoprolol succinate (TOPROL-XL) 25 MG 24 hr tablet   Edema of lower extremity    Right greater than left.  Managed with [place] compression knee highs      Fatigue    likely multifactorial.   Given tachycardia,  Checking TSH       Relevant Orders   TSH   Pernicious anemia    Managed with B12 injections       Type 2 diabetes mellitus with diabetic neuropathy, unspecified ([place]) - Primary     Historically well-controlled on diet alone .  hemoglobin A1c has been consistently at or  less than 7.0 . Rechecking today  Patient is OT  up-to-date on eye exams but is scheduled for October.  foot exam is normal today.  Patient is overdue for assessment of proteinuria. Patient is statin intolerant  and NOT on ACE/ARB for renal protection and hypertension   Lab Results  Component Value Date   HGBA1C 6.8 (H) 09/28/2019         Relevant Orders   Microalbumin / creatinine urine ratio   Hemoglobin A1c   Comprehensive metabolic panel   Venous insufficiency of both lower extremities    MANAGED WITH COMPRESSION KNEE HIGHS. WEARING THEM TODAY.  STOCKINGS WERE REMOVED FOR EXAM OF FEET.       Relevant Medications   metoprolol succinate (TOPROL-XL) 25 MG 24 hr tablet    Other Visit Diagnoses    Paroxysmal supraventricular tachycardia (HCC)       Relevant Medications   metoprolol succinate (TOPROL-XL) 25 MG 24 hr tablet      I have changed Daleen Squibb. Kolbeck's metoprolol succinate. I am also having her maintain her cholecalciferol, apixaban, Cranberry, triamcinolone cream, acetaminophen, estradiol, clobetasol cream, diphenoxylate-atropine, and cyanocobalamin.  Meds ordered this encounter  Medications  . metoprolol succinate (TOPROL-XL) 25 MG 24 hr tablet    Sig: Take 1 tablet (25 mg total) by mouth in the morning and at bedtime.    Dispense:  180 tablet    Refill:  1   I provided  30 minutes of   face-to-face time during this encounter reviewing patient's current problems and past surgeries, labs and imaging studies, providing counseling on the above mentioned problems , and coordination  of care .  Medications Discontinued During This Encounter  Medication Reason  . metoprolol succinate (TOPROL-XL) 25 MG 24 hr tablet     Follow-up: Return in about 6 months (around 06/30/2020) for follow up diabetes.   Crecencio Mc, MD

## 2019-12-29 NOTE — Assessment & Plan Note (Signed)
likely multifactorial.   Given tachycardia,  Checking TSH

## 2019-12-29 NOTE — Assessment & Plan Note (Signed)
Right greater than left.  Managed with Carrillon compression knee highs

## 2019-12-29 NOTE — Assessment & Plan Note (Signed)
Increasing metoprolol today to bid for management of tachycardia.  Continue Eliquis

## 2019-12-29 NOTE — Assessment & Plan Note (Signed)
MANAGED WITH COMPRESSION KNEE HIGHS. WEARING THEM TODAY.  STOCKINGS WERE REMOVED FOR EXAM OF FEET.

## 2019-12-29 NOTE — Assessment & Plan Note (Signed)
Managed with B12 injections

## 2019-12-29 NOTE — Assessment & Plan Note (Addendum)
Historically well-controlled on diet alone .  hemoglobin A1c has been consistently at or  less than 7.0 . Rechecking today  Patient is OT  up-to-date on eye exams but is scheduled for October.  foot exam is normal today. Patient is overdue for assessment of proteinuria. Patient is statin intolerant  and NOT on ACE/ARB for renal protection and hypertension   Lab Results  Component Value Date   HGBA1C 6.8 (H) 09/28/2019

## 2020-01-02 DIAGNOSIS — M25541 Pain in joints of right hand: Secondary | ICD-10-CM | POA: Diagnosis not present

## 2020-01-02 DIAGNOSIS — M25542 Pain in joints of left hand: Secondary | ICD-10-CM | POA: Diagnosis not present

## 2020-01-02 DIAGNOSIS — M25552 Pain in left hip: Secondary | ICD-10-CM | POA: Diagnosis not present

## 2020-01-02 DIAGNOSIS — S72142D Displaced intertrochanteric fracture of left femur, subsequent encounter for closed fracture with routine healing: Secondary | ICD-10-CM | POA: Diagnosis not present

## 2020-01-02 DIAGNOSIS — Z9181 History of falling: Secondary | ICD-10-CM | POA: Diagnosis not present

## 2020-01-02 DIAGNOSIS — G5601 Carpal tunnel syndrome, right upper limb: Secondary | ICD-10-CM | POA: Diagnosis not present

## 2020-01-03 DIAGNOSIS — G5601 Carpal tunnel syndrome, right upper limb: Secondary | ICD-10-CM | POA: Diagnosis not present

## 2020-01-03 DIAGNOSIS — M25541 Pain in joints of right hand: Secondary | ICD-10-CM | POA: Diagnosis not present

## 2020-01-03 DIAGNOSIS — M25552 Pain in left hip: Secondary | ICD-10-CM | POA: Diagnosis not present

## 2020-01-03 DIAGNOSIS — M25542 Pain in joints of left hand: Secondary | ICD-10-CM | POA: Diagnosis not present

## 2020-01-03 DIAGNOSIS — S72142D Displaced intertrochanteric fracture of left femur, subsequent encounter for closed fracture with routine healing: Secondary | ICD-10-CM | POA: Diagnosis not present

## 2020-01-03 DIAGNOSIS — Z9181 History of falling: Secondary | ICD-10-CM | POA: Diagnosis not present

## 2020-01-04 DIAGNOSIS — M25542 Pain in joints of left hand: Secondary | ICD-10-CM | POA: Diagnosis not present

## 2020-01-04 DIAGNOSIS — Z9181 History of falling: Secondary | ICD-10-CM | POA: Diagnosis not present

## 2020-01-04 DIAGNOSIS — S72142D Displaced intertrochanteric fracture of left femur, subsequent encounter for closed fracture with routine healing: Secondary | ICD-10-CM | POA: Diagnosis not present

## 2020-01-04 DIAGNOSIS — M25552 Pain in left hip: Secondary | ICD-10-CM | POA: Diagnosis not present

## 2020-01-04 DIAGNOSIS — M25541 Pain in joints of right hand: Secondary | ICD-10-CM | POA: Diagnosis not present

## 2020-01-04 DIAGNOSIS — G5601 Carpal tunnel syndrome, right upper limb: Secondary | ICD-10-CM | POA: Diagnosis not present

## 2020-01-09 DIAGNOSIS — G5601 Carpal tunnel syndrome, right upper limb: Secondary | ICD-10-CM | POA: Diagnosis not present

## 2020-01-09 DIAGNOSIS — Z9181 History of falling: Secondary | ICD-10-CM | POA: Diagnosis not present

## 2020-01-09 DIAGNOSIS — M25541 Pain in joints of right hand: Secondary | ICD-10-CM | POA: Diagnosis not present

## 2020-01-09 DIAGNOSIS — S72142D Displaced intertrochanteric fracture of left femur, subsequent encounter for closed fracture with routine healing: Secondary | ICD-10-CM | POA: Diagnosis not present

## 2020-01-09 DIAGNOSIS — M25542 Pain in joints of left hand: Secondary | ICD-10-CM | POA: Diagnosis not present

## 2020-01-09 DIAGNOSIS — M25552 Pain in left hip: Secondary | ICD-10-CM | POA: Diagnosis not present

## 2020-01-10 DIAGNOSIS — M25541 Pain in joints of right hand: Secondary | ICD-10-CM | POA: Diagnosis not present

## 2020-01-10 DIAGNOSIS — Z9181 History of falling: Secondary | ICD-10-CM | POA: Diagnosis not present

## 2020-01-10 DIAGNOSIS — G5601 Carpal tunnel syndrome, right upper limb: Secondary | ICD-10-CM | POA: Diagnosis not present

## 2020-01-10 DIAGNOSIS — S72142D Displaced intertrochanteric fracture of left femur, subsequent encounter for closed fracture with routine healing: Secondary | ICD-10-CM | POA: Diagnosis not present

## 2020-01-10 DIAGNOSIS — M25542 Pain in joints of left hand: Secondary | ICD-10-CM | POA: Diagnosis not present

## 2020-01-10 DIAGNOSIS — M25552 Pain in left hip: Secondary | ICD-10-CM | POA: Diagnosis not present

## 2020-01-11 DIAGNOSIS — G5601 Carpal tunnel syndrome, right upper limb: Secondary | ICD-10-CM | POA: Diagnosis not present

## 2020-01-11 DIAGNOSIS — M25542 Pain in joints of left hand: Secondary | ICD-10-CM | POA: Diagnosis not present

## 2020-01-11 DIAGNOSIS — S72142D Displaced intertrochanteric fracture of left femur, subsequent encounter for closed fracture with routine healing: Secondary | ICD-10-CM | POA: Diagnosis not present

## 2020-01-11 DIAGNOSIS — Z9181 History of falling: Secondary | ICD-10-CM | POA: Diagnosis not present

## 2020-01-11 DIAGNOSIS — M25541 Pain in joints of right hand: Secondary | ICD-10-CM | POA: Diagnosis not present

## 2020-01-11 DIAGNOSIS — M25552 Pain in left hip: Secondary | ICD-10-CM | POA: Diagnosis not present

## 2020-01-16 DIAGNOSIS — M25541 Pain in joints of right hand: Secondary | ICD-10-CM | POA: Diagnosis not present

## 2020-01-16 DIAGNOSIS — G5601 Carpal tunnel syndrome, right upper limb: Secondary | ICD-10-CM | POA: Diagnosis not present

## 2020-01-16 DIAGNOSIS — M25552 Pain in left hip: Secondary | ICD-10-CM | POA: Diagnosis not present

## 2020-01-16 DIAGNOSIS — Z9181 History of falling: Secondary | ICD-10-CM | POA: Diagnosis not present

## 2020-01-16 DIAGNOSIS — M25542 Pain in joints of left hand: Secondary | ICD-10-CM | POA: Diagnosis not present

## 2020-01-16 DIAGNOSIS — S72142D Displaced intertrochanteric fracture of left femur, subsequent encounter for closed fracture with routine healing: Secondary | ICD-10-CM | POA: Diagnosis not present

## 2020-01-17 DIAGNOSIS — M25552 Pain in left hip: Secondary | ICD-10-CM | POA: Diagnosis not present

## 2020-01-17 DIAGNOSIS — M25541 Pain in joints of right hand: Secondary | ICD-10-CM | POA: Diagnosis not present

## 2020-01-17 DIAGNOSIS — G5601 Carpal tunnel syndrome, right upper limb: Secondary | ICD-10-CM | POA: Diagnosis not present

## 2020-01-17 DIAGNOSIS — S72142D Displaced intertrochanteric fracture of left femur, subsequent encounter for closed fracture with routine healing: Secondary | ICD-10-CM | POA: Diagnosis not present

## 2020-01-17 DIAGNOSIS — M25542 Pain in joints of left hand: Secondary | ICD-10-CM | POA: Diagnosis not present

## 2020-01-17 DIAGNOSIS — Z9181 History of falling: Secondary | ICD-10-CM | POA: Diagnosis not present

## 2020-01-18 ENCOUNTER — Other Ambulatory Visit: Payer: Self-pay

## 2020-01-18 ENCOUNTER — Ambulatory Visit (INDEPENDENT_AMBULATORY_CARE_PROVIDER_SITE_OTHER): Payer: Medicare Other | Admitting: Nurse Practitioner

## 2020-01-18 ENCOUNTER — Encounter: Payer: Self-pay | Admitting: Nurse Practitioner

## 2020-01-18 VITALS — BP 140/80 | HR 90 | Temp 97.8°F | Ht 62.0 in | Wt 125.0 lb

## 2020-01-18 DIAGNOSIS — R300 Dysuria: Secondary | ICD-10-CM | POA: Diagnosis not present

## 2020-01-18 LAB — POCT URINALYSIS DIPSTICK
Bilirubin, UA: NEGATIVE
Blood, UA: POSITIVE
Glucose, UA: NEGATIVE
Nitrite, UA: NEGATIVE
Protein, UA: POSITIVE — AB
Spec Grav, UA: 1.01 (ref 1.010–1.025)
Urobilinogen, UA: 1 E.U./dL
pH, UA: 6 (ref 5.0–8.0)

## 2020-01-18 LAB — URINALYSIS, MICROSCOPIC ONLY

## 2020-01-18 MED ORDER — CEPHALEXIN 500 MG PO CAPS
500.0000 mg | ORAL_CAPSULE | Freq: Two times a day (BID) | ORAL | 0 refills | Status: AC
Start: 2020-01-18 — End: 2020-01-25

## 2020-01-18 NOTE — Patient Instructions (Addendum)
Lab is pending for urine check on infection.   Will call when UA is done and make recommendations for antibiotic.   Hydrate well, take cranberry pills and use Estrace vaginal cream as prescribed.  Contact a health care provider if:  You have a fever.  You develop pain in your back or sides.  You have nausea or vomiting.  You have blood in your urine.  You are not urinating as often as you usually do. Get help right away if:  Your pain is severe and not relieved with medicines.  You cannot eat or drink without vomiting.  You are confused.  You have a rapid heartbeat while at rest.  You have shaking or chills.  You feel extremely weak.     Dysuria Dysuria is pain or discomfort while urinating. The pain or discomfort may be felt in the part of your body that drains urine from the bladder (urethra) or in the surrounding tissue of the genitals. The pain may also be felt in the groin area, lower abdomen, or lower back. You may have to urinate frequently or have the sudden feeling that you have to urinate (urgency). Dysuria can affect both men and women, but it is more common in women. Dysuria can be caused by many different things, including:  Urinary tract infection.  Kidney stones or bladder stones.  Certain sexually transmitted infections (STIs), such as chlamydia.  Dehydration.  Inflammation of the tissues of the vagina.  Use of certain medicines.  Use of certain soaps or scented products that cause irritation. Follow these instructions at home: General instructions  Watch your condition for any changes.  Urinate often. Avoid holding urine for long periods of time.  After a bowel movement or urination, women should cleanse from front to back, using each tissue only once.  Urinate after sexual intercourse.  Keep all follow-up visits as told by your health care provider. This is important.  If you had any tests done to find the cause of dysuria, it is up to  you to get your test results. Ask your health care provider, or the department that is doing the test, when your results will be ready. Eating and drinking   Drink enough fluid to keep your urine pale yellow.  Avoid caffeine, tea, and alcohol. They can irritate the bladder and make dysuria worse. In men, alcohol may irritate the prostate. Medicines  Take over-the-counter and prescription medicines only as told by your health care provider.  If you were prescribed an antibiotic medicine, take it as told by your health care provider. Do not stop taking the antibiotic even if you start to feel better. Contact a health care provider if:  You have a fever.  You develop pain in your back or sides.  You have nausea or vomiting.  You have blood in your urine.  You are not urinating as often as you usually do. Get help right away if:  Your pain is severe and not relieved with medicines.  You cannot eat or drink without vomiting.  You are confused.  You have a rapid heartbeat while at rest.  You have shaking or chills.  You feel extremely weak. Summary  Dysuria is pain or discomfort while urinating. Many different conditions can lead to dysuria.  If you have dysuria, you may have to urinate frequently or have the sudden feeling that you have to urinate (urgency).  Watch your condition for any changes. Keep all follow-up visits as told by your health  care provider.  Make sure that you urinate often and drink enough fluid to keep your urine pale yellow. This information is not intended to replace advice given to you by your health care provider. Make sure you discuss any questions you have with your health care provider. Document Revised: 04/17/2017 Document Reviewed: 02/19/2017 Elsevier Patient Education  [place].

## 2020-01-18 NOTE — Progress Notes (Signed)
Established Patient Office Visit  Subjective:  Patient ID: Kara [name], female    DOB: March 26, 1923  Age: 86 y.o. MRN: [id number]  CC:  Chief Complaint  Patient presents with  . Acute Visit    UTI    HPI Kara [name] is a 86 year old who presents for possible UTI that started over the weekend.  She has urinary urgency, discomfort, and pressure with repetitive bathroom trips.  She has some mild vaginal itching as well.  She reports she has not had a UTI for over a year because she has been taking cranberry pills.  She has recently run out of cranberry pills and has not been taking them for several weeks.  She does have chronic mild stooling and wears pads.  She is careful to wipe from front to back.  She does wear a incontinent pad every day.  She has noted no blood in the urine, pelvic pain, flank pain, fevers or chills.  For some mild vaginal itching, she did use nystatin with TAC cream externally.  She thought that was her Estrace cream.  The nystatin/TAC cream burned her external vaginal area so she wiped it off and it is now her vaginal area feels much better.   Past Medical History:  Diagnosis Date  . Allergy   . Atrial fibrillation ([address])   . CAD (coronary artery disease)   . Cancer (H[facility])    UTERINE  . Chronic anticoagulation   . Chronic diarrhea   . Decubitus ulcer    sacral region  . Diabetes ([place])    diet controlled  . Dysuria   . Edema of lower extremity    mainly right foot, slightly in left foot.  . Fibrocystic breast disease   . GERD (gastroesophageal reflux disease)   . Glaucoma   . Glaucoma   . Hematuria   . Hemorrhoids   . History of colon polyps   . History of pancreatitis   . Hyperlipidemia   . Hypertension   . Hypokalemia   . IBS (irritable bowel syndrome)   . Microscopic hematuria   . Mitral valve regurgitation   . Osteoarthritis    fingers  . Pernicious anemia   . Plantar fasciitis   . Recurrent UTI   . Skin cancer   . Sleep apnea, obstructive    . Vaginal atrophy   . Vitamin D deficiency   . Yeast vaginitis     Past Surgical History:  Procedure Laterality Date  . ABDOMINAL HYSTERECTOMY  1980's  . ABDOMINAL SURGERY     for villous polyp,,,many years ago  . APPENDECTOMY  1940's  . ASCAD, s/p PTCA  11/28/2005   MID LESION   . BREAST BIOPSY Left 1970's  . CARPAL TUNNEL RELEASE Right 12/13/2015   Procedure: CARPAL TUNNEL RELEASE;  Surgeon: Thornton Park, MD;  Location: [facility] ORS;  Service: Orthopedics;  Laterality: Right;  . COLECTOMY  2015  . INTRAMEDULLARY (IM) NAIL INTERTROCHANTERIC Left 06/11/2019   Procedure: INTRAMEDULLARY (IM) NAIL INTERTROCHANTRIC;  Surgeon: Earnestine Leys, MD;  Location: [facility] ORS;  Service: Orthopedics;  Laterality: Left;  . PACEMAKER PLACEMENT    . radation     for uterine cance  . REFRACTIVE SURGERY     for bilateral glaumoma  . TONSILLECTOMY  1936    Family History  Problem Relation Age of Onset  . Coronary artery disease Father   . Kidney disease Father   . Cancer Sister   . Diabetes Other   . Cancer Mother  COLON  . Bladder Cancer Neg Hx   . Breast cancer Neg Hx     Social History   Socioeconomic History  . Marital status: Widowed    Spouse name: Not on file  . Number of children: 3  . Years of education: Not on file  . Highest education level: Not on file  Occupational History  . Occupation: Retired Restaurant manager, fast food - primary  Tobacco Use  . Smoking status: Former Research scientist (life sciences)  . Smokeless tobacco: Never Used  . Tobacco comment: quit 45 years ago  Vaping Use  . Vaping Use: Never used  Substance and Sexual Activity  . Alcohol use: Yes    Alcohol/week: 0.0 standard drinks    Comment: Rarely, 1-2 times a year  . Drug use: No  . Sexual activity: Not Currently    Birth control/protection: Surgical  Other Topics Concern  . Not on file  Social History Narrative   Lives at [facility]. Born in [place]. Travels frequently.      1 daughter, 2 step children        Regular Exercise -  NO   Daily Caffeine Use:  1 coffee            Social Determinants of Health   Financial Resource Strain:   . Difficulty of Paying Living Expenses: Not on file  Food Insecurity:   . Worried About Charity fundraiser in the Last Year: Not on file  . Ran Out of Food in the Last Year: Not on file  Transportation Needs:   . Lack of Transportation (Medical): Not on file  . Lack of Transportation (Non-Medical): Not on file  Physical Activity:   . Days of Exercise per Week: Not on file  . Minutes of Exercise per Session: Not on file  Stress:   . Feeling of Stress : Not on file  Social Connections:   . Frequency of Communication with Friends and Family: Not on file  . Frequency of Social Gatherings with Friends and Family: Not on file  . Attends Religious Services: Not on file  . Active Member of Clubs or Organizations: Not on file  . Attends Archivist Meetings: Not on file  . Marital Status: Not on file  Intimate Partner Violence:   . Fear of Current or Ex-Partner: Not on file  . Emotionally Abused: Not on file  . Physically Abused: Not on file  . Sexually Abused: Not on file    Outpatient Medications Prior to Visit  Medication Sig Dispense Refill  . acetaminophen (TYLENOL) 325 MG tablet Take 2 tablets (650 mg total) by mouth every 6 (six) hours as needed for mild pain or moderate pain (pain score 1-3 or temp > 100.5).    Marland Kitchen apixaban (ELIQUIS) 2.5 MG TABS tablet Take 2.5 mg by mouth 2 (two) times daily.    . cholecalciferol (VITAMIN D) 1000 UNITS tablet Take 1,000 Units by mouth daily.    . clobetasol cream (TEMOVATE) 0.05 % Apply topically.    . Cranberry 200 MG CAPS Take 1 capsule (200 mg total) by mouth daily. 30 capsule 2  . cyanocobalamin (,VITAMIN B-12,) 1000 MCG/ML injection INJECT 1ML INTO THE MUSCLE EVERY 14 DAYS 10 mL 0  . diphenoxylate-atropine (LOMOTIL) 2.5-0.025 MG tablet TAKE ONE TABLET BY MOUTH 3 times daily AS NEEDED FOR DIARRHEA 90  tablet 3  . estradiol (ESTRACE VAGINAL) 0.1 MG/GM vaginal cream Place vaginally.    . metoprolol succinate (TOPROL-XL) 25 MG 24  hr tablet Take 1 tablet (25 mg total) by mouth in the morning and at bedtime. 180 tablet 1  . triamcinolone cream (KENALOG) 0.1 % Apply 1 application topically 2 (two) times daily. To vulva until itching resolves,  Then reduce use to twice weekly (Patient taking differently: Apply 1 application topically 2 (two) times a week. ) 30 g 0   No facility-administered medications prior to visit.    Allergies  Allergen Reactions  . Baycol [Cerivastatin Sodium]   . Cardizem [Diltiazem Hcl]     LOWER EXTREMITY EDEMA  . Crestor [Rosuvastatin Calcium] Other (See Comments)    INTOLERANT  . Dronedarone Other (See Comments)    Fatigue  . Metoprolol Tartrate Other (See Comments)  . Omeprazole Other (See Comments)  . Pravachol     INTOLERANT  . Statins Other (See Comments)  . Vioxx [Rofecoxib]   . Zocor [Simvastatin]     INTOLERANT    Review of Systems Pertinent positives noted in history of present illness otherwise negative.   Objective:    Physical Exam Vitals reviewed.  Constitutional:      Appearance: Normal appearance. She is normal weight.  Cardiovascular:     Rate and Rhythm: Normal rate and regular rhythm.     Pulses: Normal pulses.     Heart sounds: Normal heart sounds.  Pulmonary:     Effort: Pulmonary effort is normal.     Breath sounds: Normal breath sounds.  Abdominal:     Palpations: Abdomen is soft.     Tenderness: There is no abdominal tenderness. There is no right CVA tenderness or left CVA tenderness.  Musculoskeletal:        General: Normal range of motion.  Skin:    General: Skin is warm and dry.  Neurological:     General: No focal deficit present.     Mental Status: She is alert and oriented to person, place, and time.  Psychiatric:        Mood and Affect: Mood normal.     BP 140/80 (BP Location: Left Arm, Patient Position:  Sitting, Cuff Size: Normal)   Pulse 90   Temp 97.8 F (36.6 C) (Oral)   Ht 5\' 2"  (1.575 m)   Wt 125 lb (56.7 kg)   SpO2 97%   BMI 22.86 kg/m  Wt Readings from Last 3 Encounters:  01/18/20 125 lb (56.7 kg)  12/29/19 126 lb 6.4 oz (57.3 kg)  11/29/19 120 lb (54.4 kg)   Pulse Readings from Last 3 Encounters:  01/18/20 90  12/29/19 82  11/29/19 87    BP Readings from Last 3 Encounters:  01/18/20 140/80  12/29/19 128/66  11/29/19 (!) 152/76    Lab Results  Component Value Date   CHOL 142 10/11/2013   HDL 43.10 10/11/2013   LDLCALC 60 10/11/2013   LDLDIRECT 86.3 06/30/2012   TRIG 194.0 (H) 10/11/2013   CHOLHDL 3 10/11/2013      Health Maintenance Due  Topic Date Due  . OPHTHALMOLOGY EXAM  09/24/2018  . INFLUENZA VACCINE  12/18/2019    There are no preventive care reminders to display for this patient.  Lab Results  Component Value Date   TSH 3.15 12/29/2019   Lab Results  Component Value Date   WBC 6.7 09/28/2019   HGB 13.9 09/28/2019   HCT 41.6 09/28/2019   MCV 91.4 09/28/2019   PLT 167.0 09/28/2019   Lab Results  Component Value Date   NA 139 12/29/2019   K 3.5 12/29/2019  CO2 25 12/29/2019   GLUCOSE 149 (H) 12/29/2019   BUN 20 12/29/2019   CREATININE 0.97 12/29/2019   BILITOT 2.0 (H) 12/29/2019   ALKPHOS 66 12/29/2019   AST 21 12/29/2019   ALT 15 12/29/2019   PROT 6.9 12/29/2019   ALBUMIN 4.0 12/29/2019   CALCIUM 9.2 12/29/2019   ANIONGAP 8 06/13/2019   GFR 53.33 (L) 12/29/2019     Assessment & Plan:   Problem List Items Addressed This Visit      Other   Dysuria - Primary    Begin Keflex 500 mg BID for 7 days and await urine culture. She is very symptomatic for UTI.   Take a probiotic for 2 weeks- generic Florastor, or Culturelle eat yogurt to try to prevent C diff diarrhea.       Relevant Orders   POCT Urinalysis Dipstick (Completed)   Urine Microscopic Only (Completed)   Urine Culture      Meds ordered this encounter    Medications  . cephALEXin (KEFLEX) 500 MG capsule    Sig: Take 1 capsule (500 mg total) by mouth 2 (two) times daily for 7 days.    Dispense:  14 capsule    Refill:  0    Order Specific Question:   Supervising Provider    Answer:   Einar Pheasant [[id number]]   Hydrate well, take cranberry pills and use Estrace vaginal cream as prescribed.  Contact a health care provider if:  You have a fever.  You develop pain in your back or sides.  You have nausea or vomiting.  You have blood in your urine.  You are not urinating as often as you usually do. Get help right away if:  Your pain is severe and not relieved with medicines.  You cannot eat or drink without vomiting.  You are confused.  You have a rapid heartbeat while at rest.  You have shaking or chills.  You feel extremely weak. Follow-up: No follow-ups on file.  This visit occurred during the SARS-CoV-2 public health emergency.  Safety protocols were in place, including screening questions prior to the visit, additional usage of staff PPE, and extensive cleaning of exam room while observing appropriate contact time as indicated for disinfecting solutions.    Denice Paradise, NP

## 2020-01-19 NOTE — Assessment & Plan Note (Signed)
Begin Keflex 500 mg BID for 7 days and await urine culture. She is very symptomatic for UTI.   Take a probiotic for 2 weeks- generic Florastor, or Culturelle eat yogurt to try to prevent C diff diarrhea.

## 2020-01-20 LAB — URINE CULTURE
MICRO NUMBER:: 10899160
SPECIMEN QUALITY:: ADEQUATE

## 2020-01-24 ENCOUNTER — Telehealth: Payer: Self-pay

## 2020-01-24 ENCOUNTER — Other Ambulatory Visit: Payer: Self-pay | Admitting: Nurse Practitioner

## 2020-01-24 ENCOUNTER — Telehealth: Payer: Self-pay | Admitting: Internal Medicine

## 2020-01-24 DIAGNOSIS — M1990 Unspecified osteoarthritis, unspecified site: Secondary | ICD-10-CM | POA: Diagnosis not present

## 2020-01-24 DIAGNOSIS — M25552 Pain in left hip: Secondary | ICD-10-CM | POA: Diagnosis not present

## 2020-01-24 DIAGNOSIS — M25541 Pain in joints of right hand: Secondary | ICD-10-CM | POA: Diagnosis not present

## 2020-01-24 DIAGNOSIS — G5601 Carpal tunnel syndrome, right upper limb: Secondary | ICD-10-CM | POA: Diagnosis not present

## 2020-01-24 DIAGNOSIS — R279 Unspecified lack of coordination: Secondary | ICD-10-CM | POA: Diagnosis not present

## 2020-01-24 DIAGNOSIS — M6281 Muscle weakness (generalized): Secondary | ICD-10-CM | POA: Diagnosis not present

## 2020-01-24 DIAGNOSIS — Z9181 History of falling: Secondary | ICD-10-CM | POA: Diagnosis not present

## 2020-01-24 DIAGNOSIS — S72142D Displaced intertrochanteric fracture of left femur, subsequent encounter for closed fracture with routine healing: Secondary | ICD-10-CM | POA: Diagnosis not present

## 2020-01-24 DIAGNOSIS — M25542 Pain in joints of left hand: Secondary | ICD-10-CM | POA: Diagnosis not present

## 2020-01-24 DIAGNOSIS — R2689 Other abnormalities of gait and mobility: Secondary | ICD-10-CM | POA: Diagnosis not present

## 2020-01-24 MED ORDER — FLUCONAZOLE 150 MG PO TABS: ORAL_TABLET | ORAL | 0 refills | Status: DC

## 2020-01-24 NOTE — Telephone Encounter (Signed)
Duplicate message.     [place] RECORD Access[facility]urse Patient Name: Kara [name] Gender: Female DOB: 1922-07-08 Age: 86 Y 3 M 29 D Return Phone Number: [phone] (Primary) Address: City/State/Zip: [place] [address] Client [facility] Client Site [facility] - Day Physician Deborra Medina - MD Contact Type Call Who Is Calling Patient / Member / Family / Caregiver Call Type Triage / Clinical Relationship To Patient Self Return Phone Number [phone] (Primary) Chief Complaint Urination Pain Reason for Call Symptomatic / Request for [place] states, pt on antibiotic for UTI. Pt is having worsening itching/pain. Translation No Nurse Assessment Nurse: D'Heur Lucia Gaskins, RN, [place] Date/Time (Eastern Time): 01/23/2020 9:56:06 AM Confirm and document reason for call. If symptomatic, describe symptoms. ---Caller states she is on antibiotic (Cephalexin) for UTIWednesday was first dose. Pt is having worsening itching/pain beginning Saturday. Has the patient had close contact with a person known or suspected to have the novel coronavirus illness OR traveled / lives in area with major community spread (including international travel) in the last 14 days from the onset of symptoms? * If Asymptomatic, screen for exposure and travel within the last 14 days. ---No Does the patient have any new or worsening symptoms? ---Yes Will a triage be completed? ---Yes Related visit to physician within the last 2 weeks? ---Yes Does the PT have any chronic conditions? (i.e. diabetes, asthma, this includes High risk factors for pregnancy, etc.) ---Yes List chronic conditions. ---Diabetes Is this a behavioral health or substance abuse call? ---No Guidelines Guideline Title Affirmed Question Affirmed Notes Nurse Date/Time  Eilene Ghazi Time) Vaginal Discharge [1] Symptoms of a "yeast infection" (i.e., itchy, white discharge, not bad smelling) AND [2] not improved > 3 [place], RN, Vincente Liberty 01/23/2020 10:07:26 AM PLEASE NOTE: All timestamps contained within this report are represented as [place] Standard Time. CONFIDENTIALTY NOTICE: This fax transmission is intended only for the addressee. It contains information that is legally privileged, confidential or otherwise protected from use or disclosure. If you are not the intended recipient, you are strictly prohibited from reviewing, disclosing, copying using or disseminating any of this information or taking any action in reliance on or regarding this information. If you have received this fax in error, please notify us immediately by telephone so that we can arrange for its return to [place]. Phone: [phone], Toll-Free: [phone], Fax: [phone] Page: 2 of 2 Call Id: [id number] Guidelines Guideline Title Affirmed Question Affirmed Notes Nurse Date/Time Eilene Ghazi Time) days following CARE ADVICE Disp. Time Eilene Ghazi Time) Disposition Final User 01/23/2020 10:19:19 AM SEE PCP WITHIN 3 DAYS Yes D'Heur Lucia Gaskins, RN, Vincente Liberty Caller Disagree/Comply Comply Caller Understands Yes PreDisposition Did not know what to do Care Advice Given Per Guideline * You need to be seen within 2 or 3 days. * Keep your genital area clean. Wash daily. * Keep your genital area dry. Wear cotton underwear or underwear with a cotton crotch. CALL BACK IF: * You become worse. CARE ADVICE given per Vaginal Discharge (Adult) guideline. Comments User: Vincente Liberty , D'Heur Lucia Gaskins, RN Date/Time Eilene Ghazi Time): 01/23/2020 10:19:17 AM Reiterated to caller that she call PCP office in the morning to be seen soon. Also told her there are some OTC meds available for the itching/discomfort (Monistat now available OTC). Caller stated she might have a pill for yeast, told her there are oral meds for yeast  but recommended she wait to see her  PCP so he/she could prescribe it if they felt it was warranted. Caller voiced understanding. Referrals REFERRED TO PCP OFFICE

## 2020-01-24 NOTE — Telephone Encounter (Signed)
Please triage this call and get information about what her sx are.

## 2020-01-24 NOTE — Telephone Encounter (Signed)
Pt saw Maudie Mercury on 9/1 for a UTI and now has a yeast infection and wanted something called into [facility]

## 2020-01-24 NOTE — Telephone Encounter (Signed)
Patient called in at 2:35pm. She is still waiting for a response. Spoke to [employer] about the message below.

## 2020-01-24 NOTE — Telephone Encounter (Signed)
Kara [name] has called patient and advised her that the prescription has been sent in to her pharmacy

## 2020-01-24 NOTE — Telephone Encounter (Signed)
Pt stated that on Saturday she started vaginal itching and then yesterday noticed some creamy vaginal discharge as well. Pt just finished a round of antibiotics. Pt would like to have something called in before 5pm so she can go pick it up otherwise she will not be able to get it until tomorrow. Pt was upset because she was told to call early this morning so she did and has not heard anything back yet.

## 2020-01-24 NOTE — Telephone Encounter (Signed)
Contacted patient to advise provider has called in diflucan for her symptoms and to call our office back if no improvement in vaginal candidiasis. Patient advised she will pick up the medicine today.

## 2020-01-24 NOTE — Telephone Encounter (Signed)
Pt called back checking on message below

## 2020-01-24 NOTE — Telephone Encounter (Signed)
Diflucan 150 mg x daily x 2 days.

## 2020-01-24 NOTE — Telephone Encounter (Signed)
Also, Janett Billow- please advise her to call back if no improvement in vaginal candidiasis.

## 2020-01-24 NOTE — Progress Notes (Signed)
I ordered Diflucan 150 mg daily for 2 days . Call back if no improvement.

## 2020-01-24 NOTE — Telephone Encounter (Signed)
Do you need to see her again or can you send rx in for her yeast infection from the antibiotic?

## 2020-01-25 DIAGNOSIS — Z9181 History of falling: Secondary | ICD-10-CM | POA: Diagnosis not present

## 2020-01-25 DIAGNOSIS — S72142D Displaced intertrochanteric fracture of left femur, subsequent encounter for closed fracture with routine healing: Secondary | ICD-10-CM | POA: Diagnosis not present

## 2020-01-25 DIAGNOSIS — G5601 Carpal tunnel syndrome, right upper limb: Secondary | ICD-10-CM | POA: Diagnosis not present

## 2020-01-25 DIAGNOSIS — M25541 Pain in joints of right hand: Secondary | ICD-10-CM | POA: Diagnosis not present

## 2020-01-25 DIAGNOSIS — M25552 Pain in left hip: Secondary | ICD-10-CM | POA: Diagnosis not present

## 2020-01-25 DIAGNOSIS — M25542 Pain in joints of left hand: Secondary | ICD-10-CM | POA: Diagnosis not present

## 2020-01-26 ENCOUNTER — Telehealth: Payer: Self-pay | Admitting: Internal Medicine

## 2020-01-26 NOTE — Telephone Encounter (Signed)
Spoke with pt to let her know that she can take the medication. Pt gave a verbal understanding.

## 2020-01-26 NOTE — Telephone Encounter (Signed)
Tylenol or Tylenol (#3?) ok to take.  But what is tylenol 3?

## 2020-01-26 NOTE — Telephone Encounter (Signed)
Ok, that's the same as tylenol 3  Yes she ca take that

## 2020-01-26 NOTE — Telephone Encounter (Signed)
Pt called she has a dentist appt this afternoon and they told her to contact Dr.Tullo to see if it was ok for her to take tylenol 3

## 2020-01-26 NOTE — Telephone Encounter (Signed)
Tylenol with codeine

## 2020-01-27 ENCOUNTER — Telehealth: Payer: Self-pay | Admitting: Internal Medicine

## 2020-01-27 MED ORDER — TRIAMCINOLONE ACETONIDE 0.1 % EX CREA
1.0000 | TOPICAL_CREAM | Freq: Two times a day (BID) | CUTANEOUS | 0 refills | Status: DC
Start: 2020-01-27 — End: 2020-05-31

## 2020-01-27 NOTE — Telephone Encounter (Signed)
Patient called to let Dr. Derrel Nip know she is still itching and has no relief.

## 2020-01-27 NOTE — Telephone Encounter (Signed)
If she is not having discharge,  Just itching,  She can use the nystatin/triamcinolone ointment that she has been presecribed in the past

## 2020-01-27 NOTE — Telephone Encounter (Signed)
I have refilled it.

## 2020-01-27 NOTE — Telephone Encounter (Signed)
Pt completed that diflucan and is still having vaginal itching.

## 2020-01-27 NOTE — Telephone Encounter (Signed)
Pt stated that she is not having any vaginal discharge now just itching. Pt stated that she needs a refill of the nystatin-triamcinolone. Is it okay if I refill the one that was last sent in by Dr. Gilford Rile?

## 2020-01-30 DIAGNOSIS — M25542 Pain in joints of left hand: Secondary | ICD-10-CM | POA: Diagnosis not present

## 2020-01-30 DIAGNOSIS — M25541 Pain in joints of right hand: Secondary | ICD-10-CM | POA: Diagnosis not present

## 2020-01-30 DIAGNOSIS — S72142D Displaced intertrochanteric fracture of left femur, subsequent encounter for closed fracture with routine healing: Secondary | ICD-10-CM | POA: Diagnosis not present

## 2020-01-30 DIAGNOSIS — G5601 Carpal tunnel syndrome, right upper limb: Secondary | ICD-10-CM | POA: Diagnosis not present

## 2020-01-30 DIAGNOSIS — M25552 Pain in left hip: Secondary | ICD-10-CM | POA: Diagnosis not present

## 2020-01-30 DIAGNOSIS — Z9181 History of falling: Secondary | ICD-10-CM | POA: Diagnosis not present

## 2020-01-31 DIAGNOSIS — S72142D Displaced intertrochanteric fracture of left femur, subsequent encounter for closed fracture with routine healing: Secondary | ICD-10-CM | POA: Diagnosis not present

## 2020-01-31 DIAGNOSIS — M25541 Pain in joints of right hand: Secondary | ICD-10-CM | POA: Diagnosis not present

## 2020-01-31 DIAGNOSIS — M25542 Pain in joints of left hand: Secondary | ICD-10-CM | POA: Diagnosis not present

## 2020-01-31 DIAGNOSIS — M25552 Pain in left hip: Secondary | ICD-10-CM | POA: Diagnosis not present

## 2020-01-31 DIAGNOSIS — G5601 Carpal tunnel syndrome, right upper limb: Secondary | ICD-10-CM | POA: Diagnosis not present

## 2020-01-31 DIAGNOSIS — Z9181 History of falling: Secondary | ICD-10-CM | POA: Diagnosis not present

## 2020-02-01 DIAGNOSIS — M25541 Pain in joints of right hand: Secondary | ICD-10-CM | POA: Diagnosis not present

## 2020-02-01 DIAGNOSIS — G5601 Carpal tunnel syndrome, right upper limb: Secondary | ICD-10-CM | POA: Diagnosis not present

## 2020-02-01 DIAGNOSIS — S72142D Displaced intertrochanteric fracture of left femur, subsequent encounter for closed fracture with routine healing: Secondary | ICD-10-CM | POA: Diagnosis not present

## 2020-02-01 DIAGNOSIS — Z9181 History of falling: Secondary | ICD-10-CM | POA: Diagnosis not present

## 2020-02-01 DIAGNOSIS — M25552 Pain in left hip: Secondary | ICD-10-CM | POA: Diagnosis not present

## 2020-02-01 DIAGNOSIS — M25542 Pain in joints of left hand: Secondary | ICD-10-CM | POA: Diagnosis not present

## 2020-02-06 DIAGNOSIS — M25552 Pain in left hip: Secondary | ICD-10-CM | POA: Diagnosis not present

## 2020-02-06 DIAGNOSIS — M25541 Pain in joints of right hand: Secondary | ICD-10-CM | POA: Diagnosis not present

## 2020-02-06 DIAGNOSIS — M25542 Pain in joints of left hand: Secondary | ICD-10-CM | POA: Diagnosis not present

## 2020-02-06 DIAGNOSIS — G5601 Carpal tunnel syndrome, right upper limb: Secondary | ICD-10-CM | POA: Diagnosis not present

## 2020-02-06 DIAGNOSIS — Z9181 History of falling: Secondary | ICD-10-CM | POA: Diagnosis not present

## 2020-02-06 DIAGNOSIS — S72142D Displaced intertrochanteric fracture of left femur, subsequent encounter for closed fracture with routine healing: Secondary | ICD-10-CM | POA: Diagnosis not present

## 2020-02-08 DIAGNOSIS — Z9181 History of falling: Secondary | ICD-10-CM | POA: Diagnosis not present

## 2020-02-08 DIAGNOSIS — M25552 Pain in left hip: Secondary | ICD-10-CM | POA: Diagnosis not present

## 2020-02-08 DIAGNOSIS — S72142D Displaced intertrochanteric fracture of left femur, subsequent encounter for closed fracture with routine healing: Secondary | ICD-10-CM | POA: Diagnosis not present

## 2020-02-08 DIAGNOSIS — M25542 Pain in joints of left hand: Secondary | ICD-10-CM | POA: Diagnosis not present

## 2020-02-08 DIAGNOSIS — G5601 Carpal tunnel syndrome, right upper limb: Secondary | ICD-10-CM | POA: Diagnosis not present

## 2020-02-08 DIAGNOSIS — M25541 Pain in joints of right hand: Secondary | ICD-10-CM | POA: Diagnosis not present

## 2020-02-13 DIAGNOSIS — S72142D Displaced intertrochanteric fracture of left femur, subsequent encounter for closed fracture with routine healing: Secondary | ICD-10-CM | POA: Diagnosis not present

## 2020-02-13 DIAGNOSIS — M25541 Pain in joints of right hand: Secondary | ICD-10-CM | POA: Diagnosis not present

## 2020-02-13 DIAGNOSIS — M25542 Pain in joints of left hand: Secondary | ICD-10-CM | POA: Diagnosis not present

## 2020-02-13 DIAGNOSIS — G5601 Carpal tunnel syndrome, right upper limb: Secondary | ICD-10-CM | POA: Diagnosis not present

## 2020-02-13 DIAGNOSIS — Z9181 History of falling: Secondary | ICD-10-CM | POA: Diagnosis not present

## 2020-02-13 DIAGNOSIS — M25552 Pain in left hip: Secondary | ICD-10-CM | POA: Diagnosis not present

## 2020-02-15 DIAGNOSIS — M25552 Pain in left hip: Secondary | ICD-10-CM | POA: Diagnosis not present

## 2020-02-15 DIAGNOSIS — M25542 Pain in joints of left hand: Secondary | ICD-10-CM | POA: Diagnosis not present

## 2020-02-15 DIAGNOSIS — S72142D Displaced intertrochanteric fracture of left femur, subsequent encounter for closed fracture with routine healing: Secondary | ICD-10-CM | POA: Diagnosis not present

## 2020-02-15 DIAGNOSIS — G5601 Carpal tunnel syndrome, right upper limb: Secondary | ICD-10-CM | POA: Diagnosis not present

## 2020-02-15 DIAGNOSIS — M25541 Pain in joints of right hand: Secondary | ICD-10-CM | POA: Diagnosis not present

## 2020-02-15 DIAGNOSIS — Z9181 History of falling: Secondary | ICD-10-CM | POA: Diagnosis not present

## 2020-02-20 DIAGNOSIS — R2689 Other abnormalities of gait and mobility: Secondary | ICD-10-CM | POA: Diagnosis not present

## 2020-02-20 DIAGNOSIS — Z9181 History of falling: Secondary | ICD-10-CM | POA: Diagnosis not present

## 2020-02-20 DIAGNOSIS — M25552 Pain in left hip: Secondary | ICD-10-CM | POA: Diagnosis not present

## 2020-02-20 DIAGNOSIS — M6281 Muscle weakness (generalized): Secondary | ICD-10-CM | POA: Diagnosis not present

## 2020-02-20 DIAGNOSIS — S72142D Displaced intertrochanteric fracture of left femur, subsequent encounter for closed fracture with routine healing: Secondary | ICD-10-CM | POA: Diagnosis not present

## 2020-02-22 ENCOUNTER — Telehealth: Payer: Self-pay | Admitting: Internal Medicine

## 2020-02-22 DIAGNOSIS — Z23 Encounter for immunization: Secondary | ICD-10-CM | POA: Diagnosis not present

## 2020-02-22 NOTE — Telephone Encounter (Signed)
Patient called in wanted to know if it ok to get her booster shot  She just got her flu shot today at 1pm and her booster is schedule for 10 am thursday

## 2020-02-23 NOTE — Telephone Encounter (Signed)
Yes.  Please let all clinical staff   know that the CDC has stated that the flu vaccine and the covid vaccines can be given on the same day

## 2020-02-23 NOTE — Telephone Encounter (Signed)
Spoke with pt to let her know that the CDC has stated that the flu and covid vaccine can be given together. Pt gave a verbal understanding.

## 2020-02-27 DIAGNOSIS — R2689 Other abnormalities of gait and mobility: Secondary | ICD-10-CM | POA: Diagnosis not present

## 2020-02-27 DIAGNOSIS — Z9181 History of falling: Secondary | ICD-10-CM | POA: Diagnosis not present

## 2020-02-27 DIAGNOSIS — S72142D Displaced intertrochanteric fracture of left femur, subsequent encounter for closed fracture with routine healing: Secondary | ICD-10-CM | POA: Diagnosis not present

## 2020-02-27 DIAGNOSIS — M25552 Pain in left hip: Secondary | ICD-10-CM | POA: Diagnosis not present

## 2020-02-27 DIAGNOSIS — M6281 Muscle weakness (generalized): Secondary | ICD-10-CM | POA: Diagnosis not present

## 2020-03-02 DIAGNOSIS — E119 Type 2 diabetes mellitus without complications: Secondary | ICD-10-CM | POA: Diagnosis not present

## 2020-03-02 LAB — HM DIABETES EYE EXAM

## 2020-03-05 DIAGNOSIS — S72142D Displaced intertrochanteric fracture of left femur, subsequent encounter for closed fracture with routine healing: Secondary | ICD-10-CM | POA: Diagnosis not present

## 2020-03-05 DIAGNOSIS — M25552 Pain in left hip: Secondary | ICD-10-CM | POA: Diagnosis not present

## 2020-03-05 DIAGNOSIS — M6281 Muscle weakness (generalized): Secondary | ICD-10-CM | POA: Diagnosis not present

## 2020-03-05 DIAGNOSIS — R2689 Other abnormalities of gait and mobility: Secondary | ICD-10-CM | POA: Diagnosis not present

## 2020-03-05 DIAGNOSIS — Z9181 History of falling: Secondary | ICD-10-CM | POA: Diagnosis not present

## 2020-03-06 DIAGNOSIS — S72142D Displaced intertrochanteric fracture of left femur, subsequent encounter for closed fracture with routine healing: Secondary | ICD-10-CM | POA: Diagnosis not present

## 2020-03-06 DIAGNOSIS — Z9181 History of falling: Secondary | ICD-10-CM | POA: Diagnosis not present

## 2020-03-06 DIAGNOSIS — R2689 Other abnormalities of gait and mobility: Secondary | ICD-10-CM | POA: Diagnosis not present

## 2020-03-06 DIAGNOSIS — M25552 Pain in left hip: Secondary | ICD-10-CM | POA: Diagnosis not present

## 2020-03-06 DIAGNOSIS — M6281 Muscle weakness (generalized): Secondary | ICD-10-CM | POA: Diagnosis not present

## 2020-03-07 DIAGNOSIS — I071 Rheumatic tricuspid insufficiency: Secondary | ICD-10-CM | POA: Diagnosis not present

## 2020-03-07 DIAGNOSIS — I2510 Atherosclerotic heart disease of native coronary artery without angina pectoris: Secondary | ICD-10-CM | POA: Diagnosis not present

## 2020-03-07 DIAGNOSIS — I340 Nonrheumatic mitral (valve) insufficiency: Secondary | ICD-10-CM | POA: Diagnosis not present

## 2020-03-07 DIAGNOSIS — I872 Venous insufficiency (chronic) (peripheral): Secondary | ICD-10-CM | POA: Diagnosis not present

## 2020-03-07 DIAGNOSIS — I495 Sick sinus syndrome: Secondary | ICD-10-CM | POA: Diagnosis not present

## 2020-03-07 DIAGNOSIS — I10 Essential (primary) hypertension: Secondary | ICD-10-CM | POA: Diagnosis not present

## 2020-03-07 DIAGNOSIS — I4819 Other persistent atrial fibrillation: Secondary | ICD-10-CM | POA: Diagnosis not present

## 2020-03-07 DIAGNOSIS — E782 Mixed hyperlipidemia: Secondary | ICD-10-CM | POA: Diagnosis not present

## 2020-03-07 DIAGNOSIS — I739 Peripheral vascular disease, unspecified: Secondary | ICD-10-CM | POA: Diagnosis not present

## 2020-03-13 DIAGNOSIS — Z9181 History of falling: Secondary | ICD-10-CM | POA: Diagnosis not present

## 2020-03-13 DIAGNOSIS — S72142D Displaced intertrochanteric fracture of left femur, subsequent encounter for closed fracture with routine healing: Secondary | ICD-10-CM | POA: Diagnosis not present

## 2020-03-13 DIAGNOSIS — M25552 Pain in left hip: Secondary | ICD-10-CM | POA: Diagnosis not present

## 2020-03-13 DIAGNOSIS — M6281 Muscle weakness (generalized): Secondary | ICD-10-CM | POA: Diagnosis not present

## 2020-03-13 DIAGNOSIS — R2689 Other abnormalities of gait and mobility: Secondary | ICD-10-CM | POA: Diagnosis not present

## 2020-03-14 DIAGNOSIS — M6281 Muscle weakness (generalized): Secondary | ICD-10-CM | POA: Diagnosis not present

## 2020-03-14 DIAGNOSIS — Z9181 History of falling: Secondary | ICD-10-CM | POA: Diagnosis not present

## 2020-03-14 DIAGNOSIS — S72142D Displaced intertrochanteric fracture of left femur, subsequent encounter for closed fracture with routine healing: Secondary | ICD-10-CM | POA: Diagnosis not present

## 2020-03-14 DIAGNOSIS — R2689 Other abnormalities of gait and mobility: Secondary | ICD-10-CM | POA: Diagnosis not present

## 2020-03-14 DIAGNOSIS — M25552 Pain in left hip: Secondary | ICD-10-CM | POA: Diagnosis not present

## 2020-03-19 DIAGNOSIS — S72142D Displaced intertrochanteric fracture of left femur, subsequent encounter for closed fracture with routine healing: Secondary | ICD-10-CM | POA: Diagnosis not present

## 2020-03-19 DIAGNOSIS — M6281 Muscle weakness (generalized): Secondary | ICD-10-CM | POA: Diagnosis not present

## 2020-03-19 DIAGNOSIS — M25552 Pain in left hip: Secondary | ICD-10-CM | POA: Diagnosis not present

## 2020-03-19 DIAGNOSIS — Z9181 History of falling: Secondary | ICD-10-CM | POA: Diagnosis not present

## 2020-03-19 DIAGNOSIS — R2689 Other abnormalities of gait and mobility: Secondary | ICD-10-CM | POA: Diagnosis not present

## 2020-03-21 DIAGNOSIS — S72142D Displaced intertrochanteric fracture of left femur, subsequent encounter for closed fracture with routine healing: Secondary | ICD-10-CM | POA: Diagnosis not present

## 2020-03-21 DIAGNOSIS — M6281 Muscle weakness (generalized): Secondary | ICD-10-CM | POA: Diagnosis not present

## 2020-03-21 DIAGNOSIS — M25552 Pain in left hip: Secondary | ICD-10-CM | POA: Diagnosis not present

## 2020-03-21 DIAGNOSIS — R2689 Other abnormalities of gait and mobility: Secondary | ICD-10-CM | POA: Diagnosis not present

## 2020-03-21 DIAGNOSIS — Z9181 History of falling: Secondary | ICD-10-CM | POA: Diagnosis not present

## 2020-03-28 DIAGNOSIS — L821 Other seborrheic keratosis: Secondary | ICD-10-CM | POA: Diagnosis not present

## 2020-03-28 DIAGNOSIS — Z85828 Personal history of other malignant neoplasm of skin: Secondary | ICD-10-CM | POA: Diagnosis not present

## 2020-03-28 DIAGNOSIS — Z08 Encounter for follow-up examination after completed treatment for malignant neoplasm: Secondary | ICD-10-CM | POA: Diagnosis not present

## 2020-03-28 DIAGNOSIS — D1801 Hemangioma of skin and subcutaneous tissue: Secondary | ICD-10-CM | POA: Diagnosis not present

## 2020-04-03 DIAGNOSIS — S72142D Displaced intertrochanteric fracture of left femur, subsequent encounter for closed fracture with routine healing: Secondary | ICD-10-CM | POA: Diagnosis not present

## 2020-04-03 DIAGNOSIS — M25552 Pain in left hip: Secondary | ICD-10-CM | POA: Diagnosis not present

## 2020-04-03 DIAGNOSIS — R2689 Other abnormalities of gait and mobility: Secondary | ICD-10-CM | POA: Diagnosis not present

## 2020-04-03 DIAGNOSIS — M6281 Muscle weakness (generalized): Secondary | ICD-10-CM | POA: Diagnosis not present

## 2020-04-03 DIAGNOSIS — Z9181 History of falling: Secondary | ICD-10-CM | POA: Diagnosis not present

## 2020-04-09 DIAGNOSIS — S72142D Displaced intertrochanteric fracture of left femur, subsequent encounter for closed fracture with routine healing: Secondary | ICD-10-CM | POA: Diagnosis not present

## 2020-04-09 DIAGNOSIS — Z9181 History of falling: Secondary | ICD-10-CM | POA: Diagnosis not present

## 2020-04-09 DIAGNOSIS — M6281 Muscle weakness (generalized): Secondary | ICD-10-CM | POA: Diagnosis not present

## 2020-04-09 DIAGNOSIS — M25552 Pain in left hip: Secondary | ICD-10-CM | POA: Diagnosis not present

## 2020-04-09 DIAGNOSIS — R2689 Other abnormalities of gait and mobility: Secondary | ICD-10-CM | POA: Diagnosis not present

## 2020-04-10 ENCOUNTER — Other Ambulatory Visit: Payer: Self-pay | Admitting: Internal Medicine

## 2020-04-11 DIAGNOSIS — M6281 Muscle weakness (generalized): Secondary | ICD-10-CM | POA: Diagnosis not present

## 2020-04-11 DIAGNOSIS — Z9181 History of falling: Secondary | ICD-10-CM | POA: Diagnosis not present

## 2020-04-11 DIAGNOSIS — S72142D Displaced intertrochanteric fracture of left femur, subsequent encounter for closed fracture with routine healing: Secondary | ICD-10-CM | POA: Diagnosis not present

## 2020-04-11 DIAGNOSIS — R2689 Other abnormalities of gait and mobility: Secondary | ICD-10-CM | POA: Diagnosis not present

## 2020-04-11 DIAGNOSIS — M25552 Pain in left hip: Secondary | ICD-10-CM | POA: Diagnosis not present

## 2020-04-16 DIAGNOSIS — R2689 Other abnormalities of gait and mobility: Secondary | ICD-10-CM | POA: Diagnosis not present

## 2020-04-16 DIAGNOSIS — S72142D Displaced intertrochanteric fracture of left femur, subsequent encounter for closed fracture with routine healing: Secondary | ICD-10-CM | POA: Diagnosis not present

## 2020-04-16 DIAGNOSIS — Z9181 History of falling: Secondary | ICD-10-CM | POA: Diagnosis not present

## 2020-04-16 DIAGNOSIS — M25552 Pain in left hip: Secondary | ICD-10-CM | POA: Diagnosis not present

## 2020-04-16 DIAGNOSIS — M6281 Muscle weakness (generalized): Secondary | ICD-10-CM | POA: Diagnosis not present

## 2020-04-18 DIAGNOSIS — Z9181 History of falling: Secondary | ICD-10-CM | POA: Diagnosis not present

## 2020-04-18 DIAGNOSIS — M6281 Muscle weakness (generalized): Secondary | ICD-10-CM | POA: Diagnosis not present

## 2020-04-18 DIAGNOSIS — M25552 Pain in left hip: Secondary | ICD-10-CM | POA: Diagnosis not present

## 2020-04-18 DIAGNOSIS — R2689 Other abnormalities of gait and mobility: Secondary | ICD-10-CM | POA: Diagnosis not present

## 2020-04-18 DIAGNOSIS — S72142D Displaced intertrochanteric fracture of left femur, subsequent encounter for closed fracture with routine healing: Secondary | ICD-10-CM | POA: Diagnosis not present

## 2020-05-01 DIAGNOSIS — I495 Sick sinus syndrome: Secondary | ICD-10-CM | POA: Diagnosis not present

## 2020-05-07 ENCOUNTER — Telehealth: Payer: Self-pay | Admitting: Internal Medicine

## 2020-05-07 DIAGNOSIS — R350 Frequency of micturition: Secondary | ICD-10-CM

## 2020-05-07 NOTE — Telephone Encounter (Signed)
Yes,  She can submit a urine  Labs ordered

## 2020-05-07 NOTE — Telephone Encounter (Signed)
Spoke with pt and scheduled her for a lab appt tomorrow.

## 2020-05-07 NOTE — Telephone Encounter (Signed)
Patient called in thinks she may a UTI and wanted to know if she could come in for a urine sample .  She stated you could call her daughter phone [phone]

## 2020-05-08 ENCOUNTER — Other Ambulatory Visit: Payer: Self-pay

## 2020-05-08 ENCOUNTER — Other Ambulatory Visit (INDEPENDENT_AMBULATORY_CARE_PROVIDER_SITE_OTHER): Payer: Medicare Other

## 2020-05-08 DIAGNOSIS — R350 Frequency of micturition: Secondary | ICD-10-CM | POA: Diagnosis not present

## 2020-05-08 LAB — URINALYSIS, ROUTINE W REFLEX MICROSCOPIC
Bilirubin Urine: NEGATIVE
Ketones, ur: NEGATIVE
Leukocytes,Ua: NEGATIVE
Nitrite: NEGATIVE
Specific Gravity, Urine: 1.01 (ref 1.000–1.030)
Total Protein, Urine: NEGATIVE
Urine Glucose: NEGATIVE
Urobilinogen, UA: 0.2 (ref 0.0–1.0)
pH: 6.5 (ref 5.0–8.0)

## 2020-05-09 LAB — URINE CULTURE
MICRO NUMBER:: 11343371
SPECIMEN QUALITY:: ADEQUATE

## 2020-05-12 NOTE — Progress Notes (Signed)
There is no evidence of UTI by culture results.    If your symptoms are limited to burning, please make an appointment with me or with your gynecologist for a pelvic exam, because this may be due to atrophic or other forms of vaginitis .  Regards,   Deborra Medina, MD

## 2020-05-21 ENCOUNTER — Other Ambulatory Visit: Payer: Self-pay | Admitting: Internal Medicine

## 2020-05-21 DIAGNOSIS — K529 Noninfective gastroenteritis and colitis, unspecified: Secondary | ICD-10-CM

## 2020-05-31 ENCOUNTER — Other Ambulatory Visit: Payer: Self-pay | Admitting: Internal Medicine

## 2020-07-02 ENCOUNTER — Encounter: Payer: Self-pay | Admitting: Internal Medicine

## 2020-07-02 ENCOUNTER — Ambulatory Visit (INDEPENDENT_AMBULATORY_CARE_PROVIDER_SITE_OTHER): Payer: Medicare Other | Admitting: Internal Medicine

## 2020-07-02 ENCOUNTER — Other Ambulatory Visit: Payer: Self-pay

## 2020-07-02 VITALS — BP 144/92 | HR 84 | Temp 97.7°F | Resp 17 | Ht 62.0 in | Wt 126.8 lb

## 2020-07-02 DIAGNOSIS — Z950 Presence of cardiac pacemaker: Secondary | ICD-10-CM

## 2020-07-02 DIAGNOSIS — I482 Chronic atrial fibrillation, unspecified: Secondary | ICD-10-CM | POA: Diagnosis not present

## 2020-07-02 DIAGNOSIS — Z7901 Long term (current) use of anticoagulants: Secondary | ICD-10-CM

## 2020-07-02 DIAGNOSIS — E1140 Type 2 diabetes mellitus with diabetic neuropathy, unspecified: Secondary | ICD-10-CM

## 2020-07-02 DIAGNOSIS — D510 Vitamin B12 deficiency anemia due to intrinsic factor deficiency: Secondary | ICD-10-CM | POA: Diagnosis not present

## 2020-07-02 DIAGNOSIS — I272 Pulmonary hypertension, unspecified: Secondary | ICD-10-CM | POA: Diagnosis not present

## 2020-07-02 DIAGNOSIS — I739 Peripheral vascular disease, unspecified: Secondary | ICD-10-CM | POA: Diagnosis not present

## 2020-07-02 DIAGNOSIS — I495 Sick sinus syndrome: Secondary | ICD-10-CM

## 2020-07-02 DIAGNOSIS — I2584 Coronary atherosclerosis due to calcified coronary lesion: Secondary | ICD-10-CM

## 2020-07-02 DIAGNOSIS — D6869 Other thrombophilia: Secondary | ICD-10-CM | POA: Diagnosis not present

## 2020-07-02 DIAGNOSIS — M791 Myalgia, unspecified site: Secondary | ICD-10-CM | POA: Diagnosis not present

## 2020-07-02 DIAGNOSIS — I471 Supraventricular tachycardia, unspecified: Secondary | ICD-10-CM

## 2020-07-02 DIAGNOSIS — T466X5A Adverse effect of antihyperlipidemic and antiarteriosclerotic drugs, initial encounter: Secondary | ICD-10-CM

## 2020-07-02 DIAGNOSIS — I700 Atherosclerosis of aorta: Secondary | ICD-10-CM

## 2020-07-02 DIAGNOSIS — I2510 Atherosclerotic heart disease of native coronary artery without angina pectoris: Secondary | ICD-10-CM | POA: Diagnosis not present

## 2020-07-02 LAB — COMPREHENSIVE METABOLIC PANEL
ALT: 16 U/L (ref 0–35)
AST: 21 U/L (ref 0–37)
Albumin: 4.1 g/dL (ref 3.5–5.2)
Alkaline Phosphatase: 57 U/L (ref 39–117)
BUN: 20 mg/dL (ref 6–23)
CO2: 30 mEq/L (ref 19–32)
Calcium: 9.1 mg/dL (ref 8.4–10.5)
Chloride: 103 mEq/L (ref 96–112)
Creatinine, Ser: 1.22 mg/dL — ABNORMAL HIGH (ref 0.40–1.20)
GFR: 37.65 mL/min — ABNORMAL LOW (ref >60.00)
Glucose, Bld: 116 mg/dL — ABNORMAL HIGH (ref 70–99)
Potassium: 3.8 mEq/L (ref 3.5–5.1)
Sodium: 141 mEq/L (ref 135–145)
Total Bilirubin: 2 mg/dL — ABNORMAL HIGH (ref 0.2–1.2)
Total Protein: 7 g/dL (ref 6.0–8.3)

## 2020-07-02 LAB — CBC WITH DIFFERENTIAL/PLATELET
Basophils Absolute: 0 10*3/uL (ref 0.0–0.1)
Basophils Relative: 0.2 % (ref 0.0–3.0)
Eosinophils Absolute: 0.1 10*3/uL (ref 0.0–0.7)
Eosinophils Relative: 0.9 % (ref 0.0–5.0)
HCT: 44.4 % (ref 36.0–46.0)
Hemoglobin: 14.8 g/dL (ref 12.0–15.0)
Lymphocytes Relative: 38.1 % (ref 12.0–46.0)
Lymphs Abs: 2.7 10*3/uL (ref 0.7–4.0)
MCHC: 33.3 g/dL (ref 30.0–36.0)
MCV: 93.8 fl (ref 78.0–100.0)
Monocytes Absolute: 0.4 10*3/uL (ref 0.1–1.0)
Monocytes Relative: 6.1 % (ref 3.0–12.0)
Neutro Abs: 3.8 10*3/uL (ref 1.4–7.7)
Neutrophils Relative %: 54.7 % (ref 43.0–77.0)
Platelets: 146 10*3/uL — ABNORMAL LOW (ref 150.0–400.0)
RBC: 4.73 Mil/uL (ref 3.87–5.11)
RDW: 13.2 % (ref 11.5–15.5)
WBC: 7 10*3/uL (ref 4.0–10.5)

## 2020-07-02 LAB — HEMOGLOBIN A1C: Hgb A1c MFr Bld: 6.4 % (ref 4.6–6.5)

## 2020-07-02 LAB — MICROALBUMIN / CREATININE URINE RATIO
Creatinine,U: 23.4 mg/dL
Microalb Creat Ratio: 3 mg/g (ref 0.0–30.0)
Microalb, Ur: <0.7 mg/dL (ref 0.0–1.9)

## 2020-07-02 NOTE — Patient Instructions (Addendum)
You can use the triamcinolone ointment once daily in your ear until the itching resolves,  Use as needed .  I recommend going for a walk for 15 minutes during the day when the sun is high .  If you don't use your muscles daily,  They will get weak    Exercise to stretch your hamstrings:  Sit in chair.  Stretch legs out straight.  Heels on the floor, toes pointing up. Lean forward with outstretched arms and try to touch your toes.  Slowly!  10 repeitions

## 2020-07-02 NOTE — Progress Notes (Signed)
Subjective:  Patient ID: Kara [name], female    DOB: 12/23/1922  Age: 87 y.o. MRN: [id number]  CC: The primary encounter diagnosis was Anticoagulant long-term use. Diagnoses of Type 2 diabetes mellitus with diabetic neuropathy, without long-term current use of insulin (HCC), Chronic pulmonary hypertension (HCC), Peripheral artery disease ([place]), Paroxysmal supraventricular tachycardia ([place]), Chronic atrial fibrillation ([place]), Acquired thrombophilia ([place]), Coronary atherosclerosis due to calcified coronary lesion, SSS (sick sinus syndrome) ([place]), S/P placement of cardiac pacemaker, Pernicious anemia, Myalgia due to statin, and Aortic atherosclerosis ([place]) were also pertinent to this visit.  HPI Kara [name] presents for 6 month follow upon type 2 DM,  CAD with atrial fibrillation,  Chronic noninfectious diarrhea , and hypertension  This visit occurred during the SARS-CoV-2 public health emergency.  Safety protocols were in place, including screening questions prior to the visit, additional usage of staff PPE, and extensive cleaning of exam room while observing appropriate contact time as indicated for disinfecting solutions.   Kara [name] is a 87 yr old female with diet controlled diabetes,  CAD and hypertension who presents for follow up   T2DM:  She  feels generally well,  But is not  exercising regularly or trying to lose weight.  Lives at [place].   Not Checking  blood sugars ,  usually only if she feels she may be having a hypoglycemic event..  Denies any recent hypoglyemic events.  Taking   medications as directed. Following a carbohydrate modified diet 6 days per week. Denies numbness, burning and tingling of extremities. Appetite is good.   Some occasional nocturia with urgency managed with a pad   CAD/atrial fib:  Denies chest pain , orthopnea, fluid retention .  No palpitations    Lives at [place],  No COVID infections this week,  But has curtailed activities.  She is bored and sleeping more  during the day due to lack of social outlets and activities.     Outpatient Medications Prior to Visit  Medication Sig Dispense Refill  . acetaminophen (TYLENOL) 325 MG tablet Take 2 tablets (650 mg total) by mouth every 6 (six) hours as needed for mild pain or moderate pain (pain score 1-3 or temp > 100.5).    Marland Kitchen apixaban (ELIQUIS) 2.5 MG TABS tablet Take 2.5 mg by mouth 2 (two) times daily.    . cholecalciferol (VITAMIN D) 1000 UNITS tablet Take 1,000 Units by mouth daily.    . clobetasol cream (TEMOVATE) 0.05 % Apply topically.    . Cranberry 200 MG CAPS Take 1 capsule (200 mg total) by mouth daily. 30 capsule 2  . cyanocobalamin (,VITAMIN B-12,) 1000 MCG/ML injection INJECT 1ML INTO THE MUSCLE EVERY 14 DAYS 10 mL 0  . diphenoxylate-atropine (LOMOTIL) 2.5-0.025 MG tablet TAKE 1 TABLET BY MOUTH THREE TIMES DAILYAS NEEDED FOR DIARRHEA 90 tablet 5  . estradiol (ESTRACE) 0.1 MG/GM vaginal cream Place vaginally.    . metoprolol succinate (TOPROL-XL) 25 MG 24 hr tablet Take 1 tablet (25 mg total) by mouth in the morning and at bedtime. 180 tablet 1  . triamcinolone (KENALOG) 0.1 % APPLY 1 APPLICATION TOPICALLY 2 TIMES DAILY TO VULVA UNTIL ITCHING RESOLVES THEN REDUCE USE TO TWICE WEEKLY. 15 g 1  . fluconazole (DIFLUCAN) 150 MG tablet Take one tablet today. May repeat tomorrow. (Patient not taking: Reported on 07/02/2020) 2 tablet 0   No facility-administered medications prior to visit.    Review of Systems;  Patient denies headache, fevers, malaise, unintentional weight loss, skin rash,  eye pain, sinus congestion and sinus pain, sore throat, dysphagia,  hemoptysis , cough, dyspnea, wheezing, chest pain, palpitations, orthopnea, edema, abdominal pain, nausea, melena, diarrhea, constipation, flank pain, dysuria, hematuria, urinary  Frequency, nocturia, numbness, tingling, seizures,  Focal weakness, Loss of consciousness,  Tremor, insomnia, depression, anxiety, and suicidal ideation.       Objective:  BP (!) 144/92 (BP Location: Left Arm, Patient Position: Sitting, Cuff Size: Normal)   Pulse 84   Temp 97.7 F (36.5 C) (Oral)   Resp 17   Ht 5\' 2"  (1.575 m)   Wt 126 lb 12.8 oz (57.5 kg)   SpO2 96%   BMI 23.19 kg/m   BP Readings from Last 3 Encounters:  07/02/20 (!) 144/92  01/18/20 140/80  12/29/19 128/66    Wt Readings from Last 3 Encounters:  07/02/20 126 lb 12.8 oz (57.5 kg)  01/18/20 125 lb (56.7 kg)  12/29/19 126 lb 6.4 oz (57.3 kg)    General appearance: alert, cooperative and appears stated age Ears: normal TM's and external ear canals both ears Throat: lips, mucosa, and tongue normal; teeth and gums normal Neck: no adenopathy, no carotid bruit, supple, symmetrical, trachea midline and thyroid not enlarged, symmetric, no tenderness/mass/nodules Back: symmetric, no curvature. ROM normal. No CVA tenderness. Lungs: clear to auscultation bilaterally Heart: regular rate and rhythm, S1, S2 normal, no murmur, click, rub or gallop Abdomen: soft, non-tender; bowel sounds normal; no masses,  no organomegaly Pulses: 2+ and symmetric Skin: Skin color, texture, turgor normal. No rashes or lesions Lymph nodes: Cervical, supraclavicular, and axillary nodes normal.  Lab Results  Component Value Date   HGBA1C 6.4 07/02/2020   HGBA1C 6.3 12/29/2019   HGBA1C 6.8 (H) 09/28/2019    Lab Results  Component Value Date   CREATININE 1.22 (H) 07/02/2020   CREATININE 0.97 12/29/2019   CREATININE 0.90 09/28/2019    Lab Results  Component Value Date   WBC 7.0 07/02/2020   HGB 14.8 07/02/2020   HCT 44.4 07/02/2020   PLT 146.0 (L) 07/02/2020   GLUCOSE 116 (H) 07/02/2020   CHOL 142 10/11/2013   TRIG 194.0 (H) 10/11/2013   HDL 43.10 10/11/2013   LDLDIRECT 86.3 06/30/2012   LDLCALC 60 10/11/2013   ALT 16 07/02/2020   AST 21 07/02/2020   NA 141 07/02/2020   K 3.8 07/02/2020   CL 103 07/02/2020   CREATININE 1.22 (H) 07/02/2020   BUN 20 07/02/2020   CO2 30  07/02/2020   TSH 3.15 12/29/2019   INR 1.2 06/09/2019   HGBA1C 6.4 07/02/2020   MICROALBUR <0.7 07/02/2020    No results found.  Assessment & Plan:   Problem List Items Addressed This Visit      Unprioritized   Acquired thrombophilia ([place]) - Primary    She continues to use  Eliquis for embolic stroke risk mitigation due to  atrial fibrillation. Patient has no signs of bleeding and is advised to notify her specialists prior to any procedure that may required suspension of Eliquis   Lab Results  Component Value Date   WBC 7.0 07/02/2020   HGB 14.8 07/02/2020   HCT 44.4 07/02/2020   MCV 93.8 07/02/2020   PLT 146.0 (L) 07/02/2020         Aortic atherosclerosis ([place])    Aortic arch atherosclerosis:  Reviewed findings of prior CT scan today done in 2018 which noted aortic, coronary and great vessel calcifications..  Patient has had multiple trials of statin which resulted in severe myalgias and  subsequent muscle weakness.  Given patient's age,  No further treatment is expected      Atrial fibrillation (HCC) (Chronic)    No change in dose of  metoprolol today as her  Tachycardia has resolved and she appears to be in sinus rhythm.  Continue Eliquis       Chronic pulmonary hypertension (HCC)   Coronary atherosclerosis due to calcified coronary lesion    Noted on 2018 CT angio of chest:  Aorta, coronaries and great vessels.  She is asymptomatic at rest and leads  a sedentary life DUE to COVID restrictions.  She is statin intolerant due to myalgias.  Continue antiplatelet agents      Myalgia due to statin    Patient has had multiple trials of statin which resulted in severe myalgias and subsequent muscle weakness.  Given patient's age,  No further treatment is expected.       Peripheral artery disease [facility])    Reviewed annual ABIS done in July,  Encouraged patient to resume daily walk.       Pernicious anemia    Managed with B12 injections done monthly in office  Lab Results   Component Value Date   [id number] 166 09/28/2019         S/P placement of cardiac pacemaker   SSS (sick sinus syndrome) ([place])   Type 2 diabetes mellitus with diabetic neuropathy, unspecified ([place])    Her diabetes remains well-controlled on diet alone .  hemoglobin A1c has been consistently at or  less than 7.0 .Patient is  up-to-date on eye exams.  foot exam notes neuropathy without callouses or ulcers . Patient is overdue for assessment of proteinuria. Patient is statin intolerant  and NOT on ACE/ARB for renal protection and hypertension   Lab Results  Component Value Date   HGBA1C 6.4 07/02/2020   Lab Results  Component Value Date   LABMICR See below: 05/27/2019   LABMICR See below: 11/21/2014   MICROALBUR <0.7 07/02/2020   MICROALBUR 1.0 12/29/2019           Relevant Orders   Comprehensive metabolic panel (Completed)   Hemoglobin A1c (Completed)   Microalbumin / creatinine urine ratio (Completed)    Other Visit Diagnoses    Paroxysmal supraventricular tachycardia (HCC)   (Chronic)        I provided  30 minutes  during this encounter reviewing patient's current problems and past surgeries, labs and imaging studies, providing counseling on the above mentioned problems I na face to face visit  , and coordination  of care .  I have discontinued Daleen Squibb. Adamik's fluconazole. I am also having her maintain her cholecalciferol, apixaban, Cranberry, acetaminophen, estradiol, clobetasol cream, metoprolol succinate, cyanocobalamin, diphenoxylate-atropine, and triamcinolone.  No orders of the defined types were placed in this encounter.   Medications Discontinued During This Encounter  Medication Reason  . fluconazole (DIFLUCAN) 150 MG tablet     Follow-up: Return in about 6 months (around 12/30/2020) for follow up diabetes.   Crecencio Mc, MD

## 2020-07-03 ENCOUNTER — Other Ambulatory Visit: Payer: Self-pay | Admitting: Internal Medicine

## 2020-07-03 ENCOUNTER — Encounter: Payer: Self-pay | Admitting: Internal Medicine

## 2020-07-03 DIAGNOSIS — T466X5A Adverse effect of antihyperlipidemic and antiarteriosclerotic drugs, initial encounter: Secondary | ICD-10-CM | POA: Insufficient documentation

## 2020-07-03 DIAGNOSIS — R944 Abnormal results of kidney function studies: Secondary | ICD-10-CM

## 2020-07-03 DIAGNOSIS — Z950 Presence of cardiac pacemaker: Secondary | ICD-10-CM | POA: Insufficient documentation

## 2020-07-03 DIAGNOSIS — M791 Myalgia, unspecified site: Secondary | ICD-10-CM | POA: Insufficient documentation

## 2020-07-03 DIAGNOSIS — I700 Atherosclerosis of aorta: Secondary | ICD-10-CM | POA: Insufficient documentation

## 2020-07-03 NOTE — Assessment & Plan Note (Signed)
She continues to use  Eliquis for embolic stroke risk mitigation due to  atrial fibrillation. Patient has no signs of bleeding and is advised to notify her specialists prior to any procedure that may required suspension of Eliquis   Lab Results  Component Value Date   WBC 7.0 07/02/2020   HGB 14.8 07/02/2020   HCT 44.4 07/02/2020   MCV 93.8 07/02/2020   PLT 146.0 (L) 07/02/2020

## 2020-07-03 NOTE — Assessment & Plan Note (Signed)
No change in dose of  metoprolol today as her  Tachycardia has resolved and she appears to be in sinus rhythm.  Continue Eliquis

## 2020-07-03 NOTE — Assessment & Plan Note (Addendum)
Managed with B12 injections done monthly in office  Lab Results  Component Value Date   [id number] 408 09/28/2019

## 2020-07-03 NOTE — Assessment & Plan Note (Signed)
Her diabetes remains well-controlled on diet alone .  hemoglobin A1c has been consistently at or  less than 7.0 .Patient is  up-to-date on eye exams.  foot exam notes neuropathy without callouses or ulcers . Patient is overdue for assessment of proteinuria. Patient is statin intolerant  and NOT on ACE/ARB for renal protection and hypertension   Lab Results  Component Value Date   HGBA1C 6.4 07/02/2020   Lab Results  Component Value Date   LABMICR See below: 05/27/2019   LABMICR See below: 11/21/2014   MICROALBUR <0.7 07/02/2020   MICROALBUR 1.0 12/29/2019

## 2020-07-03 NOTE — Assessment & Plan Note (Signed)
Aortic arch atherosclerosis:  Reviewed findings of prior CT scan today done in 2018 which noted aortic, coronary and great vessel calcifications..  Patient has had multiple trials of statin which resulted in severe myalgias and subsequent muscle weakness.  Given patient's age,  No further treatment is expected

## 2020-07-03 NOTE — Assessment & Plan Note (Signed)
Reviewed annual ABIS done in July,  Encouraged patient to resume daily walk.

## 2020-07-03 NOTE — Assessment & Plan Note (Signed)
Patient has had multiple trials of statin which resulted in severe myalgias and subsequent muscle weakness.  Given patient's age,  No further treatment is expected.

## 2020-07-03 NOTE — Assessment & Plan Note (Signed)
Noted on 2018 CT angio of chest:  Aorta, coronaries and great vessels.  She is asymptomatic at rest and leads  a sedentary life DUE to COVID restrictions.  She is statin intolerant due to myalgias.  Continue antiplatelet agents

## 2020-07-05 ENCOUNTER — Telehealth: Payer: Self-pay | Admitting: Internal Medicine

## 2020-07-05 NOTE — Telephone Encounter (Signed)
Pt called to get lab results °

## 2020-07-05 NOTE — Telephone Encounter (Signed)
See result note message 

## 2020-07-13 ENCOUNTER — Emergency Department (HOSPITAL_COMMUNITY): Payer: Medicare Other

## 2020-07-13 ENCOUNTER — Encounter (HOSPITAL_COMMUNITY): Payer: Self-pay | Admitting: Emergency Medicine

## 2020-07-13 ENCOUNTER — Other Ambulatory Visit: Payer: Self-pay

## 2020-07-13 ENCOUNTER — Inpatient Hospital Stay (HOSPITAL_COMMUNITY)
Admission: EM | Admit: 2020-07-13 | Discharge: 2020-07-16 | DRG: 083 | Disposition: A | Discharge status: Home / Self Care | Payer: Medicare Other | Source: Skilled Nursing Facility | Attending: Internal Medicine | Admitting: Internal Medicine

## 2020-07-13 DIAGNOSIS — W19XXXA Unspecified fall, initial encounter: Secondary | ICD-10-CM

## 2020-07-13 DIAGNOSIS — I495 Sick sinus syndrome: Secondary | ICD-10-CM | POA: Diagnosis present

## 2020-07-13 DIAGNOSIS — R52 Pain, unspecified: Secondary | ICD-10-CM | POA: Diagnosis not present

## 2020-07-13 DIAGNOSIS — E872 Acidosis: Secondary | ICD-10-CM | POA: Diagnosis not present

## 2020-07-13 DIAGNOSIS — W010XXA Fall on same level from slipping, tripping and stumbling without subsequent striking against object, initial encounter: Secondary | ICD-10-CM | POA: Diagnosis present

## 2020-07-13 DIAGNOSIS — Z66 Do not resuscitate: Secondary | ICD-10-CM | POA: Diagnosis not present

## 2020-07-13 DIAGNOSIS — S0285XA Fracture of orbit, unspecified, initial encounter for closed fracture: Secondary | ICD-10-CM

## 2020-07-13 DIAGNOSIS — R55 Syncope and collapse: Secondary | ICD-10-CM

## 2020-07-13 DIAGNOSIS — R410 Disorientation, unspecified: Secondary | ICD-10-CM | POA: Diagnosis not present

## 2020-07-13 DIAGNOSIS — Y92129 Unspecified place in nursing home as the place of occurrence of the external cause: Secondary | ICD-10-CM

## 2020-07-13 DIAGNOSIS — S0292XA Unspecified fracture of facial bones, initial encounter for closed fracture: Secondary | ICD-10-CM

## 2020-07-13 DIAGNOSIS — R0902 Hypoxemia: Secondary | ICD-10-CM | POA: Diagnosis not present

## 2020-07-13 DIAGNOSIS — S06359A Traumatic hemorrhage of left cerebrum with loss of consciousness of unspecified duration, initial encounter: Secondary | ICD-10-CM | POA: Diagnosis not present

## 2020-07-13 DIAGNOSIS — S199XXA Unspecified injury of neck, initial encounter: Secondary | ICD-10-CM | POA: Diagnosis not present

## 2020-07-13 DIAGNOSIS — S0232XA Fracture of orbital floor, left side, initial encounter for closed fracture: Secondary | ICD-10-CM | POA: Diagnosis present

## 2020-07-13 DIAGNOSIS — Z950 Presence of cardiac pacemaker: Secondary | ICD-10-CM

## 2020-07-13 DIAGNOSIS — I517 Cardiomegaly: Secondary | ICD-10-CM | POA: Diagnosis not present

## 2020-07-13 DIAGNOSIS — I10 Essential (primary) hypertension: Secondary | ICD-10-CM | POA: Diagnosis not present

## 2020-07-13 DIAGNOSIS — S06890A Other specified intracranial injury without loss of consciousness, initial encounter: Secondary | ICD-10-CM | POA: Diagnosis not present

## 2020-07-13 DIAGNOSIS — Z923 Personal history of irradiation: Secondary | ICD-10-CM

## 2020-07-13 DIAGNOSIS — M79642 Pain in left hand: Secondary | ICD-10-CM | POA: Diagnosis not present

## 2020-07-13 DIAGNOSIS — Z8542 Personal history of malignant neoplasm of other parts of uterus: Secondary | ICD-10-CM

## 2020-07-13 DIAGNOSIS — I619 Nontraumatic intracerebral hemorrhage, unspecified: Secondary | ICD-10-CM | POA: Diagnosis present

## 2020-07-13 DIAGNOSIS — I482 Chronic atrial fibrillation, unspecified: Secondary | ICD-10-CM | POA: Diagnosis not present

## 2020-07-13 DIAGNOSIS — R58 Hemorrhage, not elsewhere classified: Secondary | ICD-10-CM | POA: Diagnosis not present

## 2020-07-13 DIAGNOSIS — Z23 Encounter for immunization: Secondary | ICD-10-CM

## 2020-07-13 DIAGNOSIS — R404 Transient alteration of awareness: Secondary | ICD-10-CM | POA: Diagnosis not present

## 2020-07-13 DIAGNOSIS — S022XXA Fracture of nasal bones, initial encounter for closed fracture: Secondary | ICD-10-CM | POA: Diagnosis present

## 2020-07-13 DIAGNOSIS — K219 Gastro-esophageal reflux disease without esophagitis: Secondary | ICD-10-CM | POA: Diagnosis present

## 2020-07-13 DIAGNOSIS — E876 Hypokalemia: Secondary | ICD-10-CM | POA: Diagnosis present

## 2020-07-13 DIAGNOSIS — R102 Pelvic and perineal pain: Secondary | ICD-10-CM | POA: Diagnosis not present

## 2020-07-13 DIAGNOSIS — Z20822 Contact with and (suspected) exposure to covid-19: Secondary | ICD-10-CM | POA: Diagnosis not present

## 2020-07-13 DIAGNOSIS — S0219XA Other fracture of base of skull, initial encounter for closed fracture: Secondary | ICD-10-CM | POA: Diagnosis not present

## 2020-07-13 DIAGNOSIS — E1140 Type 2 diabetes mellitus with diabetic neuropathy, unspecified: Secondary | ICD-10-CM | POA: Diagnosis not present

## 2020-07-13 DIAGNOSIS — E119 Type 2 diabetes mellitus without complications: Secondary | ICD-10-CM

## 2020-07-13 DIAGNOSIS — S0240DA Maxillary fracture, left side, initial encounter for closed fracture: Secondary | ICD-10-CM | POA: Diagnosis not present

## 2020-07-13 DIAGNOSIS — S0240CA Maxillary fracture, right side, initial encounter for closed fracture: Secondary | ICD-10-CM | POA: Diagnosis not present

## 2020-07-13 DIAGNOSIS — S0993XA Unspecified injury of face, initial encounter: Secondary | ICD-10-CM | POA: Diagnosis not present

## 2020-07-13 DIAGNOSIS — R402412 Glasgow coma scale score 13-15, at arrival to emergency department: Secondary | ICD-10-CM | POA: Diagnosis present

## 2020-07-13 DIAGNOSIS — E785 Hyperlipidemia, unspecified: Secondary | ICD-10-CM | POA: Diagnosis present

## 2020-07-13 HISTORY — DX: Unspecified fracture of facial bones, initial encounter for closed fracture: S02.92XA

## 2020-07-13 HISTORY — DX: Fracture of orbit, unspecified, initial encounter for closed fracture: S02.85XA

## 2020-07-13 LAB — MAGNESIUM: Magnesium: 1.8 mg/dL (ref 1.7–2.4)

## 2020-07-13 LAB — I-STAT CHEM 8, ED
BUN: 24 mg/dL — ABNORMAL HIGH (ref 8–23)
Calcium, Ion: 1.11 mmol/L — ABNORMAL LOW (ref 1.15–1.40)
Chloride: 103 mmol/L (ref 98–111)
Creatinine, Ser: 0.8 mg/dL (ref 0.44–1.00)
Glucose, Bld: 227 mg/dL — ABNORMAL HIGH (ref 70–99)
HCT: 44 % (ref 36.0–46.0)
Hemoglobin: 15 g/dL (ref 12.0–15.0)
Potassium: 2.9 mmol/L — ABNORMAL LOW (ref 3.5–5.1)
Sodium: 142 mmol/L (ref 135–145)
TCO2: 24 mmol/L (ref 22–32)

## 2020-07-13 LAB — URINALYSIS, ROUTINE W REFLEX MICROSCOPIC
Bacteria, UA: NONE SEEN
Bilirubin Urine: NEGATIVE
Glucose, UA: NEGATIVE mg/dL
Ketones, ur: NEGATIVE mg/dL
Leukocytes,Ua: NEGATIVE
Nitrite: NEGATIVE
Protein, ur: NEGATIVE mg/dL
Specific Gravity, Urine: 1.006 (ref 1.005–1.030)
pH: 7 (ref 5.0–8.0)

## 2020-07-13 LAB — CBG MONITORING, ED: Glucose-Capillary: 142 mg/dL — ABNORMAL HIGH (ref 70–99)

## 2020-07-13 LAB — COMPREHENSIVE METABOLIC PANEL
ALT: 19 U/L (ref 0–44)
AST: 24 U/L (ref 15–41)
Albumin: 3.6 g/dL (ref 3.5–5.0)
Alkaline Phosphatase: 50 U/L (ref 38–126)
Anion gap: 13 (ref 5–15)
BUN: 22 mg/dL (ref 8–23)
CO2: 24 mmol/L (ref 22–32)
Calcium: 8.7 mg/dL — ABNORMAL LOW (ref 8.9–10.3)
Chloride: 104 mmol/L (ref 98–111)
Creatinine, Ser: 0.96 mg/dL (ref 0.44–1.00)
GFR, Estimated: 54 mL/min — ABNORMAL LOW (ref >60)
Glucose, Bld: 236 mg/dL — ABNORMAL HIGH (ref 70–99)
Potassium: 3 mmol/L — ABNORMAL LOW (ref 3.5–5.1)
Sodium: 141 mmol/L (ref 135–145)
Total Bilirubin: 1.7 mg/dL — ABNORMAL HIGH (ref 0.3–1.2)
Total Protein: 6.5 g/dL (ref 6.5–8.1)

## 2020-07-13 LAB — GLUCOSE, CAPILLARY: Glucose-Capillary: 151 mg/dL — ABNORMAL HIGH (ref 70–99)

## 2020-07-13 LAB — RESP PANEL BY RT-PCR (FLU A&B, COVID) ARPGX2
Influenza A by PCR: NEGATIVE
Influenza B by PCR: NEGATIVE
SARS Coronavirus 2 by RT PCR: NEGATIVE

## 2020-07-13 LAB — CBC
HCT: 43 % (ref 36.0–46.0)
Hemoglobin: 14.2 g/dL (ref 12.0–15.0)
MCH: 31.5 pg (ref 26.0–34.0)
MCHC: 33 g/dL (ref 30.0–36.0)
MCV: 95.3 fL (ref 80.0–100.0)
Platelets: 152 10*3/uL (ref 150–400)
RBC: 4.51 MIL/uL (ref 3.87–5.11)
RDW: 13.1 % (ref 11.5–15.5)
WBC: 7.5 10*3/uL (ref 4.0–10.5)
nRBC: 0 % (ref 0.0–0.2)

## 2020-07-13 LAB — SAMPLE TO BLOOD BANK

## 2020-07-13 LAB — LACTIC ACID, PLASMA
Lactic Acid, Venous: 3.7 mmol/L (ref 0.5–1.9)
Lactic Acid, Venous: 4.1 mmol/L (ref 0.5–1.9)

## 2020-07-13 LAB — PROTIME-INR
INR: 1.1 (ref 0.8–1.2)
Prothrombin Time: 13.9 seconds (ref 11.4–15.2)

## 2020-07-13 MED ORDER — LABETALOL HCL 5 MG/ML IV SOLN
10.0000 mg | Freq: Once | INTRAVENOUS | Status: AC
  Administered 2020-07-13: 10 mg via INTRAVENOUS
  Filled 2020-07-13: qty 4

## 2020-07-13 MED ORDER — METOPROLOL SUCCINATE ER 25 MG PO TB24
25.0000 mg | ORAL_TABLET | Freq: Two times a day (BID) | ORAL | Status: DC
  Administered 2020-07-13 – 2020-07-16 (×6): 25 mg via ORAL
  Filled 2020-07-13 (×6): qty 1

## 2020-07-13 MED ORDER — INSULIN ASPART 100 UNIT/ML ~~LOC~~ SOLN
0.0000 [IU] | Freq: Three times a day (TID) | SUBCUTANEOUS | Status: DC
  Administered 2020-07-14: 1 [IU] via SUBCUTANEOUS
  Administered 2020-07-14: 2 [IU] via SUBCUTANEOUS
  Administered 2020-07-15: 3 [IU] via SUBCUTANEOUS
  Administered 2020-07-15 – 2020-07-16 (×3): 1 [IU] via SUBCUTANEOUS
  Administered 2020-07-16: 2 [IU] via SUBCUTANEOUS

## 2020-07-13 MED ORDER — POTASSIUM CHLORIDE 10 MEQ/100ML IV SOLN
10.0000 meq | Freq: Once | INTRAVENOUS | Status: AC
  Administered 2020-07-13: 10 meq via INTRAVENOUS
  Filled 2020-07-13: qty 100

## 2020-07-13 MED ORDER — INSULIN ASPART 100 UNIT/ML ~~LOC~~ SOLN: 0.0000 [IU] | Freq: Every day | SUBCUTANEOUS | Status: DC

## 2020-07-13 MED ORDER — LABETALOL HCL 5 MG/ML IV SOLN
5.0000 mg | Freq: Once | INTRAVENOUS | Status: AC
  Administered 2020-07-13: 5 mg via INTRAVENOUS
  Filled 2020-07-13: qty 4

## 2020-07-13 MED ORDER — TETANUS-DIPHTH-ACELL PERTUSSIS 5-2.5-18.5 LF-MCG/0.5 IM SUSY
0.5000 mL | PREFILLED_SYRINGE | Freq: Once | INTRAMUSCULAR | Status: AC
  Administered 2020-07-13: 0.5 mL via INTRAMUSCULAR
  Filled 2020-07-13: qty 0.5

## 2020-07-13 MED ORDER — LACTATED RINGERS IV BOLUS
1000.0000 mL | Freq: Once | INTRAVENOUS | Status: AC
  Administered 2020-07-13: 1000 mL via INTRAVENOUS

## 2020-07-13 MED ORDER — ACETAMINOPHEN 325 MG PO TABS
650.0000 mg | ORAL_TABLET | Freq: Four times a day (QID) | ORAL | Status: DC | PRN
  Administered 2020-07-13 – 2020-07-15 (×2): 650 mg via ORAL
  Filled 2020-07-13 (×2): qty 2

## 2020-07-13 MED ORDER — PROTHROMBIN COMPLEX CONC HUMAN 500 UNITS IV KIT
2624.0000 [IU] | PACK | Status: AC
  Administered 2020-07-13: 2624 [IU] via INTRAVENOUS
  Filled 2020-07-13: qty 624

## 2020-07-13 MED ORDER — MORPHINE SULFATE (PF) 2 MG/ML IV SOLN
1.0000 mg | INTRAVENOUS | Status: DC | PRN
  Administered 2020-07-13: 1 mg via INTRAVENOUS
  Filled 2020-07-13: qty 1

## 2020-07-13 MED ORDER — LABETALOL HCL 5 MG/ML IV SOLN: 10.0000 mg | Freq: Once | INTRAVENOUS | Status: DC

## 2020-07-13 NOTE — Progress Notes (Signed)
Responded to ED page to support patient who experienced fall.  Patient is ok and being seen by staff.  Provided support to staff and patient . Will follow as needed.  Jaclynn Major, [place], [facility], Pager [phone]

## 2020-07-13 NOTE — ED Provider Notes (Signed)
[facility] EMERGENCY DEPARTMENT Provider Note   CSN: [id number] Arrival date & time: 07/13/20  1451     History Chief Complaint: Kara [name] is a 87 y.o. female with h/o of Afib on Eliquis with pacemaker, HTN, HLD, T2DM, and GERD who presents to ED from SNF via EMS for level 2 trauma activation for witnessed GLF. Patient awoke this AM in her usual state of health. Reports walking to bridge earlier this afternoon when she suddenly blacked out. Bystander reports seeing her trip and fall to the ground. She does not remember falling. Remembers waking up in back of ambulance. Bruising noted to L eye. C-collar applied. Glucose 200s in field. Denies any prodromal symptoms. Upon arrival, ABCs intact, GCS 15, and HDS. Patient complaining of pain to her L 3rd finger.  The history is provided by the patient, the EMS personnel and medical records.  Fall This is a new problem. The current episode started less than 1 hour ago. The problem occurs rarely. The problem has not changed since onset.Pertinent negatives include no chest pain, no abdominal pain, no headaches and no shortness of breath. Nothing aggravates the symptoms. Nothing relieves the symptoms. He has tried nothing for the symptoms.       History reviewed. No pertinent past medical history.  Patient Active Problem List   Diagnosis Date Noted  . Intraparenchymal hemorrhage of brain ([place]) 07/13/2020  . Syncope and collapse 07/13/2020  . Facial fracture due to fall ([place]) 07/13/2020  . Orbital fracture ([place]) 07/13/2020  . Hypokalemia 07/13/2020  . Atrial fibrillation, chronic ([place]) 07/13/2020  . HTN (hypertension) 07/13/2020  . Type 2 diabetes mellitus ([place]) 07/13/2020  . DNR (do not resuscitate) 07/13/2020    History reviewed. No pertinent surgical history.   OB History   No obstetric history on file.     History reviewed. No pertinent family history.     Home Medications Prior to Admission  medications   Not on File    Allergies    Patient has no allergy information on record.  Review of Systems   Review of Systems  Constitutional: Negative for chills and fever.  HENT: Positive for facial swelling. Negative for ear pain and sore throat.   Eyes: Negative for pain and visual disturbance.  Respiratory: Negative for cough and shortness of breath.   Cardiovascular: Negative for chest pain and palpitations.  Gastrointestinal: Negative for abdominal pain and vomiting.  Genitourinary: Negative for dysuria and hematuria.  Musculoskeletal: Negative for arthralgias and back pain.  Skin: Negative for color change and rash.  Neurological: Negative for seizures, syncope and headaches.  All other systems reviewed and are negative.   Physical Exam Updated Vital Signs BP 125/60 (BP Location: Right Arm)   Pulse 87   Temp 98.2 F (36.8 C) (Oral)   Resp 20   Ht 5\' 2"  (1.575 m)   Wt 56.1 kg   SpO2 94%   BMI 22.62 kg/m   Physical Exam Vitals and nursing note reviewed.  Constitutional:      General: She is awake. She is not in acute distress.    Appearance: She is well-developed, normal weight and well-nourished. She is not ill-appearing.     Interventions: Cervical collar in place.  HENT:     Head: Normocephalic and atraumatic.     Jaw: There is normal jaw occlusion.     Comments: L periorbital swelling and ecchymosis with small skin tear to lateral aspect of eyelid.  Right Ear: External ear normal.     Left Ear: External ear normal.     Nose: Nose normal. No congestion or rhinorrhea.     Mouth/Throat:     Mouth: Mucous membranes are moist.     Pharynx: Oropharynx is clear. No oropharyngeal exudate or posterior oropharyngeal erythema.     Comments: Dried blood to lips without obvious intraoral lesion. Eyes:     General: No scleral icterus.       Right eye: No discharge.        Left eye: No discharge.     Extraocular Movements: Extraocular movements intact.      Conjunctiva/sclera: Conjunctivae normal.     Pupils: Pupils are equal, round, and reactive to light.  Cardiovascular:     Rate and Rhythm: Normal rate and regular rhythm.     Pulses: Normal pulses.     Heart sounds: Normal heart sounds. No murmur heard.   Pulmonary:     Effort: Pulmonary effort is normal. No respiratory distress.     Breath sounds: Normal breath sounds. No wheezing, rhonchi or rales.     Comments: Chest stable to compression without crepitus, deformity, or overlying skin changes. Clavicles stable and nontender. Chest:     Chest wall: No tenderness.  Abdominal:     General: Abdomen is flat. There is no distension.     Palpations: Abdomen is soft.     Tenderness: There is no abdominal tenderness. There is no guarding or rebound.  Musculoskeletal:     Cervical back: Neck supple.     Right lower leg: Edema (chronic) present.     Left lower leg: No edema.     Comments: Tenderness to L 3rd digit without deformity or swelling; NVI intact. Remainder of LUE as well as other extremities atraumatic and well perfused and NVI. Pelvis nontender and stable to compression. No C/T/L spine tenderness without stepoff or deformities.  Skin:    General: Skin is warm and dry.  Neurological:     Mental Status: She is alert.  Psychiatric:        Mood and Affect: Mood and affect normal.        Behavior: Behavior is cooperative.     ED Results / Procedures / Treatments   Labs (all labs ordered are listed, but only abnormal results are displayed) Labs Reviewed  COMPREHENSIVE METABOLIC PANEL - Abnormal; Notable for the following components:      Result Value   Potassium 3.0 (*)    Glucose, Bld 236 (*)    Calcium 8.7 (*)    Total Bilirubin 1.7 (*)    GFR, Estimated 54 (*)    All other components within normal limits  URINALYSIS, ROUTINE W REFLEX MICROSCOPIC - Abnormal; Notable for the following components:   Color, Urine STRAW (*)    Hgb urine dipstick SMALL (*)    All other  components within normal limits  LACTIC ACID, PLASMA - Abnormal; Notable for the following components:   Lactic Acid, Venous 3.7 (*)    All other components within normal limits  LACTIC ACID, PLASMA - Abnormal; Notable for the following components:   Lactic Acid, Venous 4.1 (*)    All other components within normal limits  GLUCOSE, CAPILLARY - Abnormal; Notable for the following components:   Glucose-Capillary 151 (*)    All other components within normal limits  I-STAT CHEM 8, ED - Abnormal; Notable for the following components:   Potassium 2.9 (*)    BUN 24 (*)  Glucose, Bld 227 (*)    Calcium, Ion 1.11 (*)    All other components within normal limits  CBG MONITORING, ED - Abnormal; Notable for the following components:   Glucose-Capillary 142 (*)    All other components within normal limits  RESP PANEL BY RT-PCR (FLU A&B, COVID) [id number]  CBC  PROTIME-INR  MAGNESIUM  CBC  BASIC METABOLIC PANEL  HEMOGLOBIN A1C  SAMPLE TO BLOOD BANK    EKG None  Radiology CT HEAD WO CONTRAST  Result Date: 07/13/2020 CLINICAL DATA:  Facial trauma, fall EXAM: CT HEAD WITHOUT CONTRAST CT MAXILLOFACIAL WITHOUT CONTRAST CT CERVICAL SPINE WITHOUT CONTRAST TECHNIQUE: Multidetector CT imaging of the head, cervical spine, and maxillofacial structures were performed using the standard protocol without intravenous contrast. Multiplanar CT image reconstructions of the cervical spine and maxillofacial structures were also generated. COMPARISON:  None. FINDINGS: CT HEAD FINDINGS Brain: Patchy and confluent areas of decreased attenuation are noted throughout the deep and periventricular white matter of the cerebral hemispheres bilaterally, compatible with chronic microvascular ischemic disease. No evidence of large-territorial acute infarction. At least four distinct hyperdense foci within the left frontal lobe measuring 6 x 48mm, 6x4 mm, 2x2 mm, 2x79mm (4:18-21). Possible other punctate foci more superiorly  (4:24). These appear to be located at the gray-white matter junction. No definite extra-axial collection. No definite pneumocephalus. No mass effect or midline shift. No hydrocephalus. Basilar cisterns are patent. Vascular: No hyperdense vessel. Atherosclerotic calcifications are present within the cavernous internal carotid and vertebral arteries. Skull: No acute fracture or focal lesion. Other: None. CT MAXILLOFACIAL FINDINGS Osseous: Markedly comminuted and 6 mm depressed left orbital floor fracture. Comminuted nasal process of bilateral maxilla fractures (6:88-92). This is noted to extend posteriorly on the left side to include a minimally displaced fracture of the anterior wall of the left maxillary sinus (6:77) as well as comminuted fracture of the medial wall of the left maxillary sinus (9:36). Left frontal sinus fracture (10:43) that is minimally displaced. Likely left ethmoid sinus fracture. No definite lamina papyracea fracture. No definite crista galli or cribriform plates bilaterally. Sinuses/Orbits: Air-fluid level with hyperdense fluid within the left maxillary sinus. Air-fluid level within the left frontal sinus and ethmoid sinuses. Otherwise the remaining paranasal sinuses and mastoid air cells are clear. Bilateral lens replacement. Otherwise the orbits are unremarkable. Soft tissues: Soft tissue edema the left periorbital region with associated subcutaneus soft tissue emphysema and hematoma formation. Soft tissue edema and hematoma extends inferiorly to the left maxillary subcutaneus soft tissues. CT CERVICAL SPINE FINDINGS Alignment: Grade 1 anterolisthesis of C4 on C5, C6 on C7, C7 on T1. Otherwise normal. Skull base and vertebrae: Multilevel degenerative changes of the spine worse at the C5-C6 level. No acute fracture. No aggressive appearing focal osseous lesion or focal pathologic process. Soft tissues and spinal canal: No prevertebral fluid or swelling. No visible canal hematoma. Upper chest:  Trace biapical pleural/pulmonary scarring. Other: None. IMPRESSION: 1. At least five distinct subcentimeter foci of intraparenchymal hemorrhage within the left frontal lobe. Underlying mass lesions not excluded. 2. No large territorial ischemic infarction identified. 3. Markedly comminuted and 6 mm depressed left orbital floor fracture. Inferior rectus muscle may be involved. Recommend clinical correlation for entrapment. 4. Minimally displaced anterior wall and comminuted medial wall fracture of the left maxillary sinus. Associated hemorrhage within the left maxillary sinus. 5. Minimally displaced fractures of the left frontal sinus. Possible left ethmoid sinus fracture. 6. Comminuted nasal process of the maxilla fractures bilaterally. 7. Left cribriform plate  fracture not excluded. No pneumocephalus identified. 8. No acute displaced fracture or traumatic listhesis of the cervical spine. These results were called by telephone at the time of interpretation on 07/13/2020 at 4:14 pm to provider Dr. Rex Kras, who verbally acknowledged these results. Electronically Signed   By: Iven Finn M.D.   On: 07/13/2020 16:26   CT CERVICAL SPINE WO CONTRAST  Result Date: 07/13/2020 CLINICAL DATA:  Facial trauma, fall EXAM: CT HEAD WITHOUT CONTRAST CT MAXILLOFACIAL WITHOUT CONTRAST CT CERVICAL SPINE WITHOUT CONTRAST TECHNIQUE: Multidetector CT imaging of the head, cervical spine, and maxillofacial structures were performed using the standard protocol without intravenous contrast. Multiplanar CT image reconstructions of the cervical spine and maxillofacial structures were also generated. COMPARISON:  None. FINDINGS: CT HEAD FINDINGS Brain: Patchy and confluent areas of decreased attenuation are noted throughout the deep and periventricular white matter of the cerebral hemispheres bilaterally, compatible with chronic microvascular ischemic disease. No evidence of large-territorial acute infarction. At least four distinct  hyperdense foci within the left frontal lobe measuring 6 x 28mm, 6x4 mm, 2x2 mm, 2x36mm (4:18-21). Possible other punctate foci more superiorly (4:24). These appear to be located at the gray-white matter junction. No definite extra-axial collection. No definite pneumocephalus. No mass effect or midline shift. No hydrocephalus. Basilar cisterns are patent. Vascular: No hyperdense vessel. Atherosclerotic calcifications are present within the cavernous internal carotid and vertebral arteries. Skull: No acute fracture or focal lesion. Other: None. CT MAXILLOFACIAL FINDINGS Osseous: Markedly comminuted and 6 mm depressed left orbital floor fracture. Comminuted nasal process of bilateral maxilla fractures (6:88-92). This is noted to extend posteriorly on the left side to include a minimally displaced fracture of the anterior wall of the left maxillary sinus (6:77) as well as comminuted fracture of the medial wall of the left maxillary sinus (9:36). Left frontal sinus fracture (10:43) that is minimally displaced. Likely left ethmoid sinus fracture. No definite lamina papyracea fracture. No definite crista galli or cribriform plates bilaterally. Sinuses/Orbits: Air-fluid level with hyperdense fluid within the left maxillary sinus. Air-fluid level within the left frontal sinus and ethmoid sinuses. Otherwise the remaining paranasal sinuses and mastoid air cells are clear. Bilateral lens replacement. Otherwise the orbits are unremarkable. Soft tissues: Soft tissue edema the left periorbital region with associated subcutaneus soft tissue emphysema and hematoma formation. Soft tissue edema and hematoma extends inferiorly to the left maxillary subcutaneus soft tissues. CT CERVICAL SPINE FINDINGS Alignment: Grade 1 anterolisthesis of C4 on C5, C6 on C7, C7 on T1. Otherwise normal. Skull base and vertebrae: Multilevel degenerative changes of the spine worse at the C5-C6 level. No acute fracture. No aggressive appearing focal osseous  lesion or focal pathologic process. Soft tissues and spinal canal: No prevertebral fluid or swelling. No visible canal hematoma. Upper chest: Trace biapical pleural/pulmonary scarring. Other: None. IMPRESSION: 1. At least five distinct subcentimeter foci of intraparenchymal hemorrhage within the left frontal lobe. Underlying mass lesions not excluded. 2. No large territorial ischemic infarction identified. 3. Markedly comminuted and 6 mm depressed left orbital floor fracture. Inferior rectus muscle may be involved. Recommend clinical correlation for entrapment. 4. Minimally displaced anterior wall and comminuted medial wall fracture of the left maxillary sinus. Associated hemorrhage within the left maxillary sinus. 5. Minimally displaced fractures of the left frontal sinus. Possible left ethmoid sinus fracture. 6. Comminuted nasal process of the maxilla fractures bilaterally. 7. Left cribriform plate fracture not excluded. No pneumocephalus identified. 8. No acute displaced fracture or traumatic listhesis of the cervical spine. These results were  called by telephone at the time of interpretation on 07/13/2020 at 4:14 pm to provider Dr. Rex Kras, who verbally acknowledged these results. Electronically Signed   By: Iven Finn M.D.   On: 07/13/2020 16:26   DG Pelvis Portable  Result Date: 07/13/2020 CLINICAL DATA:  Pain following fall EXAM: PORTABLE PELVIS 1-2 VIEWS COMPARISON:  None. FINDINGS: Frontal pelvis obtained. There is screw and nail fixation in the proximal left femur with alignment at the previous fracture site essentially anatomic. Screw tip in proximal femoral head. There is no appreciable acute fracture or dislocation. There is moderate symmetric narrowing of each hip joint. There is spurring in the pubic symphysis region. No erosion. There is extensive common femoral, superficial femoral, and profunda femoral artery calcification bilaterally. There is degenerative change in the lower lumbar spine.  IMPRESSION: Postoperative change proximal left femur. No acute fracture or dislocation. Symmetric moderate narrowing of each hip joint. Degenerative change in lower lumbar spine. Multiple foci of arterial vascular calcification noted. Electronically Signed   By: Lowella Grip III M.D.   On: 07/13/2020 15:38   DG Hand 2 View Left  Result Date: 07/13/2020 CLINICAL DATA:  Pain following fall EXAM: LEFT HAND - 2 VIEW COMPARISON:  None. FINDINGS: Frontal and lateral views were obtained. No fracture or dislocation. There is osteoarthritic change in all PIP and DIP joints as well as in the first IP joint. No erosive change. There are multiple foci of arterial vascular calcification. IMPRESSION: No acute fracture or dislocation. 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 Foci of vascular calcification noted at several sites. Electronically Signed   By: Lowella Grip III M.D.   On: 07/13/2020 15:40   DG Chest Port 1 View  Result Date: 07/13/2020 CLINICAL DATA:  Pain following fall EXAM: PORTABLE CHEST 1 VIEW COMPARISON:  None FINDINGS: Lungs are clear. Heart is mildly enlarged with pacemaker leads attached to the right atrium and right ventricle. No adenopathy. There is aortic atherosclerosis. No pneumothorax. No bone lesions. IMPRESSION: Cardiomegaly with pacemaker leads attached to right atrium and right ventricle. Lungs clear. No pneumothorax. Aortic Atherosclerosis (ICD10-I70.0). Electronically Signed   By: Lowella Grip III M.D.   On: 07/13/2020 15:39   CT MAXILLOFACIAL WO CONTRAST  Result Date: 07/13/2020 CLINICAL DATA:  Facial trauma, fall EXAM: CT HEAD WITHOUT CONTRAST CT MAXILLOFACIAL WITHOUT CONTRAST CT CERVICAL SPINE WITHOUT CONTRAST TECHNIQUE: Multidetector CT imaging of the head, cervical spine, and maxillofacial structures were performed using the standard protocol without intravenous contrast. Multiplanar CT image reconstructions of the cervical spine and maxillofacial structures  were also generated. COMPARISON:  None. FINDINGS: CT HEAD FINDINGS Brain: Patchy and confluent areas of decreased attenuation are noted throughout the deep and periventricular white matter of the cerebral hemispheres bilaterally, compatible with chronic microvascular ischemic disease. No evidence of large-territorial acute infarction. At least four distinct hyperdense foci within the left frontal lobe measuring 6 x 7mm, 6x4 mm, 2x2 mm, 2x86mm (4:18-21). Possible other punctate foci more superiorly (4:24). These appear to be located at the gray-white matter junction. No definite extra-axial collection. No definite pneumocephalus. No mass effect or midline shift. No hydrocephalus. Basilar cisterns are patent. Vascular: No hyperdense vessel. Atherosclerotic calcifications are present within the cavernous internal carotid and vertebral arteries. Skull: No acute fracture or focal lesion. Other: None. CT MAXILLOFACIAL FINDINGS Osseous: Markedly comminuted and 6 mm depressed left orbital floor fracture. Comminuted nasal process of bilateral maxilla fractures (6:88-92). This is noted to extend posteriorly on the left side to include a minimally  displaced fracture of the anterior wall of the left maxillary sinus (6:77) as well as comminuted fracture of the medial wall of the left maxillary sinus (9:36). Left frontal sinus fracture (10:43) that is minimally displaced. Likely left ethmoid sinus fracture. No definite lamina papyracea fracture. No definite crista galli or cribriform plates bilaterally. Sinuses/Orbits: Air-fluid level with hyperdense fluid within the left maxillary sinus. Air-fluid level within the left frontal sinus and ethmoid sinuses. Otherwise the remaining paranasal sinuses and mastoid air cells are clear. Bilateral lens replacement. Otherwise the orbits are unremarkable. Soft tissues: Soft tissue edema the left periorbital region with associated subcutaneus soft tissue emphysema and hematoma formation. Soft  tissue edema and hematoma extends inferiorly to the left maxillary subcutaneus soft tissues. CT CERVICAL SPINE FINDINGS Alignment: Grade 1 anterolisthesis of C4 on C5, C6 on C7, C7 on T1. Otherwise normal. Skull base and vertebrae: Multilevel degenerative changes of the spine worse at the C5-C6 level. No acute fracture. No aggressive appearing focal osseous lesion or focal pathologic process. Soft tissues and spinal canal: No prevertebral fluid or swelling. No visible canal hematoma. Upper chest: Trace biapical pleural/pulmonary scarring. Other: None. IMPRESSION: 1. At least five distinct subcentimeter foci of intraparenchymal hemorrhage within the left frontal lobe. Underlying mass lesions not excluded. 2. No large territorial ischemic infarction identified. 3. Markedly comminuted and 6 mm depressed left orbital floor fracture. Inferior rectus muscle may be involved. Recommend clinical correlation for entrapment. 4. Minimally displaced anterior wall and comminuted medial wall fracture of the left maxillary sinus. Associated hemorrhage within the left maxillary sinus. 5. Minimally displaced fractures of the left frontal sinus. Possible left ethmoid sinus fracture. 6. Comminuted nasal process of the maxilla fractures bilaterally. 7. Left cribriform plate fracture not excluded. No pneumocephalus identified. 8. No acute displaced fracture or traumatic listhesis of the cervical spine. These results were called by telephone at the time of interpretation on 07/13/2020 at 4:14 pm to provider Dr. Rex Kras, who verbally acknowledged these results. Electronically Signed   By: Iven Finn M.D.   On: 07/13/2020 16:26    Procedures Procedures  Medications Ordered in ED Medications  acetaminophen (TYLENOL) tablet 650 mg (650 mg Oral Given 07/13/20 2016)  morphine 2 MG/ML injection 1 mg (1 mg Intravenous Given 07/13/20 2017)  metoprolol succinate (TOPROL-XL) 24 hr tablet 25 mg (25 mg Oral Given 07/13/20 2143)  insulin  aspart (novoLOG) injection 0-9 Units (has no administration in time range)  insulin aspart (novoLOG) injection 0-5 Units (0 Units Subcutaneous Not Given 07/13/20 2139)  Tdap (BOOSTRIX) injection 0.5 mL (0.5 mLs Intramuscular Given 07/13/20 1557)  lactated ringers bolus 1,000 mL (0 mLs Intravenous Stopped 07/13/20 1734)  potassium chloride 10 mEq in 100 mL IVPB (0 mEq Intravenous Stopped 07/13/20 1916)  prothrombin complex conc human (KCENTRA) IVPB 2,624 Units (0 Units Intravenous Stopped 07/13/20 1753)  labetalol (NORMODYNE) injection 10 mg (10 mg Intravenous Given 07/13/20 1711)  labetalol (NORMODYNE) injection 5 mg (5 mg Intravenous Given 07/13/20 1904)    ED Course  I have reviewed the triage vital signs and the nursing notes.  Pertinent labs & imaging results that were available during my care of the patient were reviewed by me and considered in my medical decision making (see chart for details).    MDM Rules/Calculators/A&P                          Patient is a 39yoF with history and physical as described above who presents  to the ED as a level 2 trauma activation for mechanical GLF on Eliquis. Upon arrival, ABCs intact, GCS 15, and HDS. IV access established. Initial portable CXR and pelvis unremarkable for acute traumatic injury. Secondary survey then performed with pertinent physical exam findings as described above. Patient then sent to CT for CT face, head, and neck. Additional plain films also ordered. Patient not endorsing any pain and resting comfortably.  CT imaging demonstrated multiple L-sided facial fractures with L orbital wall blowout fracture with concern for inferior rectus muscle entrapment; EOMI with no reported diplopia on exam. CT head demonstrated 5 sub-centimeter IPH to L frontal lobe with no midline shift. Eliquis reversed in ED and IV labetalol given for BP control. Discussed patient with Trauma ENT who reviewed CT imaging and recommend outpatient follow up in clinic in 2  weeks. Discussed patient with Neurosurgery who agree with reversing anticoagulation and recommend observation overnight and will re-evaluate in the AM. Discussed patient with Trauma surgery as well who recommends medicine admit given advanced age and comorbidities. Patient otherwise remained HDS with no further acute events in the ED. Discussed patient with medicine who will admit. Patient in stable condition at time of admission.  Final Clinical Impression(s) / ED Diagnoses Final diagnoses:  Joretta Bachelor, MD 07/13/20 [place], Wenda Overland, MD 07/16/20 [phone]

## 2020-07-13 NOTE — H&P (Addendum)
History and Physical    Kara [name] GDJ:[id number] DOB: December 12, 1922 DOA: 07/13/2020  PCP: Pcp, No  Patient coming from: Independent living facility  I have personally briefly reviewed patient's old medical records in [place]  Chief Complaint: Fall  HPI: Kara [name] is a 87 y.o. female with medical history significant for sick sinus syndrome s/p pacemaker, atrial fibrillation on Eliquis, hypertension, type 2 diabetes, hyperlipidemia and remote history of uterine cancer s/p radiation who presents following an unwitnessed fall/syncopal episode.  Patient lives in an independent living facility and today was hurrying out of her room and headed to the elevator down the hall.  States she was using her Rollator.  Does not recall anything that happened afterwards and only remember being taken into the ambulance by EMS.  She denies any dizziness or lightheadedness prior.  States she did wake up with some right-sided shoulder chest pain this morning but had to move some heavier boxes the day before.  Denies any palpitations.  States she has often feel tired and does not feel like doing much for the past several months but otherwise today was in her normal state of health.  Denies any depression.  Patient arrived as a trauma level 2 with GCS score 15 and hemodynamically stable.  No hypoglycemia.  CT head and CT Maxillofacial revealed at least 5 distinct subcentimeter foci of intraparenchymal hemorrhage in the left frontal lobe.  There is a comminuted and depressed left orbital floor.  Minimally displaced anterior wall and comminuted medial wall fracture of the left maxillary sinus.  Associated hemorrhage within the left maxillary sinus.  Minimally displaced fracture of the left frontal sinus and possible left ethmoid sinus fracture.  Comminuted nasal fracture maxilla fracture bilaterally.  Her last dose of Eliquis was the evening of 2/24.  She was given Kcentra in the ED for reversal.She was also  noted to some elevated blood pressure and was given a total of 15 mg of labetalol in the ED.  Her pacemaker was interrogated and reportedly showed no Arrhythmia. Trauma surgery, neurosurgery and facial trauma surgeon were consulted by ED physician who recommended admission for monitoring and no further imaging recommendation at this time.   Review of Systems: Constitutional: No Weight Change, No Fever ENT/Mouth: No sore throat, No Rhinorrhea Eyes: No Eye Pain, No Vision Changes Cardiovascular: No Chest Pain, no SOB, No Palpitations Respiratory: No Cough, No Sputum, Gastrointestinal: No Nausea, No Vomiting, No Diarrhea, No Constipation, No Pain Genitourinary: no Urinary Incontinence, No Urgency, No Flank Pain Musculoskeletal: No Arthralgias, No Myalgias Skin: No Skin Lesions, No Pruritus, Neuro: no Weakness, No Numbness,  + Loss of Consciousness, + Syncope Psych: No Anxiety/Panic, No Depression, no decrease appetite Heme/Lymph: No Bruising, No Bleeding    Surgical history Intramedullary (im) nail intertrochanteric (Left) 12/13/2015 Carpal tunnel release (Right) 2015 Colectomy7/13/2007 [facility], s/p PTCA [Other] 1936 Tonsillectomy1980's Abdominal hysterectomyDate Unknown Abdominal surgery 1940's Appendectomy1970's Breast biopsy (Left)Date Unknown Pacemaker placement radiation  Refractive surgery   Social History Denies tobacco, alcohol or illicit drug use  Family Hx  Father (Deceased)  Coronary artery disease   Kidney disease  Sister (Deceased)  Cancer  Other Diabetes  Mother Cancer      Prior to Admission medications   Not on File    Physical Exam: Vitals:   07/13/20 1845 07/13/20 1900 07/13/20 1915 07/13/20 1930  BP: (!) 153/83 (!) 156/83 (!) 155/73 (!) 151/74  Pulse: (!) 101 92 100 (!) 105  Resp: (!) 23 (!) 24 (!)  27 (!) 21  Temp:      TempSrc:      SpO2: 97% 95% 96% 97%  Weight:      Height:        Constitutional: NAD, calm, comfortable, elderly female  appearing younger than age laying flat in bed  Vitals:   07/13/20 1845 07/13/20 1900 07/13/20 1915 07/13/20 1930  BP: (!) 153/83 (!) 156/83 (!) 155/73 (!) 151/74  Pulse: (!) 101 92 100 (!) 105  Resp: (!) 23 (!) 24 (!) 27 (!) 21  Temp:      TempSrc:      SpO2: 97% 95% 96% 97%  Weight:      Height:       Eyes: PERRL with pinpoint right pupil, evolving ecchymosis of left eye with small adjacent lateral side laceration- able to actively open left eye with some blurred vision ENMT: Mucous membranes are moist.  Neck: normal, suppl Respiratory: clear to auscultation bilaterally, no wheezing, no crackles. Normal respiratory effort on room air . No accessory muscle use.  Cardiovascular: Regular rate and rhythm, no murmurs / rubs / gallops.  Nonpitting edema bilateral ankles.  2+ pedal pulses.  Abdomen: no tenderness, no masses palpated. No hepatosplenomegaly. Bowel sounds positive.  Musculoskeletal: no clubbing / cyanosis. No joint deformity upper and lower extremities. Good ROM, no contractures. Normal muscle tone. Ecchymosis of the dorsal surface of the left 5th digit  Skin: no rashes, lesions, ulcers. No induration Neurologic: CN 2-12 grossly intact. Sensation intact, Strength 5/5 in all 4.  Psychiatric: Normal judgment and insight. Alert and oriented x 3. Normal mood.     Labs on Admission: I have personally reviewed following labs and imaging studies  CBC: Recent Labs  Lab 07/13/20 1454 07/13/20 1509  WBC 7.5  --   HGB 14.2 15.0  HCT 43.0 44.0  MCV 95.3  --   PLT 152  --    Basic Metabolic Panel: Recent Labs  Lab 07/13/20 1454 07/13/20 1509 07/13/20 1513  NA 141 142  --   K 3.0* 2.9*  --   CL 104 103  --   CO2 24  --   --   GLUCOSE 236* 227*  --   BUN 22 24*  --   CREATININE 0.96 0.80  --   CALCIUM 8.7*  --   --   MG  --   --  1.8   GFR: Estimated Creatinine Clearance: 33.3 mL/min (by C-G formula based on SCr of 0.8 mg/dL). Liver Function Tests: Recent Labs  Lab  07/13/20 1454  AST 24  ALT 19  ALKPHOS 50  BILITOT 1.7*  PROT 6.5  ALBUMIN 3.6   No results for input(s): LIPASE, AMYLASE in the last 168 hours. No results for input(s): AMMONIA in the last 168 hours. Coagulation Profile: Recent Labs  Lab 07/13/20 1454  INR 1.1   Cardiac Enzymes: No results for input(s): CKTOTAL, CKMB, CKMBINDEX, TROPONINI in the last 168 hours. BNP (last 3 results) No results for input(s): PROBNP in the last 8760 hours. HbA1C: No results for input(s): HGBA1C in the last 72 hours. CBG: No results for input(s): GLUCAP in the last 168 hours. Lipid Profile: No results for input(s): CHOL, HDL, LDLCALC, TRIG, CHOLHDL, LDLDIRECT in the last 72 hours. Thyroid Function Tests: No results for input(s): TSH, T4TOTAL, FREET4, T3FREE, THYROIDAB in the last 72 hours. Anemia Panel: No results for input(s): VITAMINB12, FOLATE, FERRITIN, TIBC, IRON, RETICCTPCT in the last 72 hours. Urine analysis:    Component  Value Date/Time   COLORURINE STRAW (A) 07/13/2020 1647   APPEARANCEUR CLEAR 07/13/2020 1647   LABSPEC 1.006 07/13/2020 1647   PHURINE 7.0 07/13/2020 1647   GLUCOSEU NEGATIVE 07/13/2020 1647   HGBUR SMALL (A) 07/13/2020 1647   BILIRUBINUR NEGATIVE 07/13/2020 1647   KETONESUR NEGATIVE 07/13/2020 1647   PROTEINUR NEGATIVE 07/13/2020 1647   NITRITE NEGATIVE 07/13/2020 1647   LEUKOCYTESUR NEGATIVE 07/13/2020 1647    Radiological Exams on Admission: CT HEAD WO CONTRAST  Result Date: 07/13/2020 CLINICAL DATA:  Facial trauma, fall EXAM: CT HEAD WITHOUT CONTRAST CT MAXILLOFACIAL WITHOUT CONTRAST CT CERVICAL SPINE WITHOUT CONTRAST TECHNIQUE: Multidetector CT imaging of the head, cervical spine, and maxillofacial structures were performed using the standard protocol without intravenous contrast. Multiplanar CT image reconstructions of the cervical spine and maxillofacial structures were also generated. COMPARISON:  None. FINDINGS: CT HEAD FINDINGS Brain: Patchy and  confluent areas of decreased attenuation are noted throughout the deep and periventricular white matter of the cerebral hemispheres bilaterally, compatible with chronic microvascular ischemic disease. No evidence of large-territorial acute infarction. At least four distinct hyperdense foci within the left frontal lobe measuring 6 x 54mm, 6x4 mm, 2x2 mm, 2x60mm (4:18-21). Possible other punctate foci more superiorly (4:24). These appear to be located at the gray-white matter junction. No definite extra-axial collection. No definite pneumocephalus. No mass effect or midline shift. No hydrocephalus. Basilar cisterns are patent. Vascular: No hyperdense vessel. Atherosclerotic calcifications are present within the cavernous internal carotid and vertebral arteries. Skull: No acute fracture or focal lesion. Other: None. CT MAXILLOFACIAL FINDINGS Osseous: Markedly comminuted and 6 mm depressed left orbital floor fracture. Comminuted nasal process of bilateral maxilla fractures (6:88-92). This is noted to extend posteriorly on the left side to include a minimally displaced fracture of the anterior wall of the left maxillary sinus (6:77) as well as comminuted fracture of the medial wall of the left maxillary sinus (9:36). Left frontal sinus fracture (10:43) that is minimally displaced. Likely left ethmoid sinus fracture. No definite lamina papyracea fracture. No definite crista galli or cribriform plates bilaterally. Sinuses/Orbits: Air-fluid level with hyperdense fluid within the left maxillary sinus. Air-fluid level within the left frontal sinus and ethmoid sinuses. Otherwise the remaining paranasal sinuses and mastoid air cells are clear. Bilateral lens replacement. Otherwise the orbits are unremarkable. Soft tissues: Soft tissue edema the left periorbital region with associated subcutaneus soft tissue emphysema and hematoma formation. Soft tissue edema and hematoma extends inferiorly to the left maxillary subcutaneus soft  tissues. CT CERVICAL SPINE FINDINGS Alignment: Grade 1 anterolisthesis of C4 on C5, C6 on C7, C7 on T1. Otherwise normal. Skull base and vertebrae: Multilevel degenerative changes of the spine worse at the C5-C6 level. No acute fracture. No aggressive appearing focal osseous lesion or focal pathologic process. Soft tissues and spinal canal: No prevertebral fluid or swelling. No visible canal hematoma. Upper chest: Trace biapical pleural/pulmonary scarring. Other: None. IMPRESSION: 1. At least five distinct subcentimeter foci of intraparenchymal hemorrhage within the left frontal lobe. Underlying mass lesions not excluded. 2. No large territorial ischemic infarction identified. 3. Markedly comminuted and 6 mm depressed left orbital floor fracture. Inferior rectus muscle may be involved. Recommend clinical correlation for entrapment. 4. Minimally displaced anterior wall and comminuted medial wall fracture of the left maxillary sinus. Associated hemorrhage within the left maxillary sinus. 5. Minimally displaced fractures of the left frontal sinus. Possible left ethmoid sinus fracture. 6. Comminuted nasal process of the maxilla fractures bilaterally. 7. Left cribriform plate fracture not excluded. No  pneumocephalus identified. 8. No acute displaced fracture or traumatic listhesis of the cervical spine. These results were called by telephone at the time of interpretation on 07/13/2020 at 4:14 pm to provider Dr. Rex Kras, who verbally acknowledged these results. Electronically Signed   By: Iven Finn M.D.   On: 07/13/2020 16:26   CT CERVICAL SPINE WO CONTRAST  Result Date: 07/13/2020 CLINICAL DATA:  Facial trauma, fall EXAM: CT HEAD WITHOUT CONTRAST CT MAXILLOFACIAL WITHOUT CONTRAST CT CERVICAL SPINE WITHOUT CONTRAST TECHNIQUE: Multidetector CT imaging of the head, cervical spine, and maxillofacial structures were performed using the standard protocol without intravenous contrast. Multiplanar CT image  reconstructions of the cervical spine and maxillofacial structures were also generated. COMPARISON:  None. FINDINGS: CT HEAD FINDINGS Brain: Patchy and confluent areas of decreased attenuation are noted throughout the deep and periventricular white matter of the cerebral hemispheres bilaterally, compatible with chronic microvascular ischemic disease. No evidence of large-territorial acute infarction. At least four distinct hyperdense foci within the left frontal lobe measuring 6 x 69mm, 6x4 mm, 2x2 mm, 2x75mm (4:18-21). Possible other punctate foci more superiorly (4:24). These appear to be located at the gray-white matter junction. No definite extra-axial collection. No definite pneumocephalus. No mass effect or midline shift. No hydrocephalus. Basilar cisterns are patent. Vascular: No hyperdense vessel. Atherosclerotic calcifications are present within the cavernous internal carotid and vertebral arteries. Skull: No acute fracture or focal lesion. Other: None. CT MAXILLOFACIAL FINDINGS Osseous: Markedly comminuted and 6 mm depressed left orbital floor fracture. Comminuted nasal process of bilateral maxilla fractures (6:88-92). This is noted to extend posteriorly on the left side to include a minimally displaced fracture of the anterior wall of the left maxillary sinus (6:77) as well as comminuted fracture of the medial wall of the left maxillary sinus (9:36). Left frontal sinus fracture (10:43) that is minimally displaced. Likely left ethmoid sinus fracture. No definite lamina papyracea fracture. No definite crista galli or cribriform plates bilaterally. Sinuses/Orbits: Air-fluid level with hyperdense fluid within the left maxillary sinus. Air-fluid level within the left frontal sinus and ethmoid sinuses. Otherwise the remaining paranasal sinuses and mastoid air cells are clear. Bilateral lens replacement. Otherwise the orbits are unremarkable. Soft tissues: Soft tissue edema the left periorbital region with  associated subcutaneus soft tissue emphysema and hematoma formation. Soft tissue edema and hematoma extends inferiorly to the left maxillary subcutaneus soft tissues. CT CERVICAL SPINE FINDINGS Alignment: Grade 1 anterolisthesis of C4 on C5, C6 on C7, C7 on T1. Otherwise normal. Skull base and vertebrae: Multilevel degenerative changes of the spine worse at the C5-C6 level. No acute fracture. No aggressive appearing focal osseous lesion or focal pathologic process. Soft tissues and spinal canal: No prevertebral fluid or swelling. No visible canal hematoma. Upper chest: Trace biapical pleural/pulmonary scarring. Other: None. IMPRESSION: 1. At least five distinct subcentimeter foci of intraparenchymal hemorrhage within the left frontal lobe. Underlying mass lesions not excluded. 2. No large territorial ischemic infarction identified. 3. Markedly comminuted and 6 mm depressed left orbital floor fracture. Inferior rectus muscle may be involved. Recommend clinical correlation for entrapment. 4. Minimally displaced anterior wall and comminuted medial wall fracture of the left maxillary sinus. Associated hemorrhage within the left maxillary sinus. 5. Minimally displaced fractures of the left frontal sinus. Possible left ethmoid sinus fracture. 6. Comminuted nasal process of the maxilla fractures bilaterally. 7. Left cribriform plate fracture not excluded. No pneumocephalus identified. 8. No acute displaced fracture or traumatic listhesis of the cervical spine. These results were called by telephone at  the time of interpretation on 07/13/2020 at 4:14 pm to provider Dr. Rex Kras, who verbally acknowledged these results. Electronically Signed   By: Iven Finn M.D.   On: 07/13/2020 16:26   DG Pelvis Portable  Result Date: 07/13/2020 CLINICAL DATA:  Pain following fall EXAM: PORTABLE PELVIS 1-2 VIEWS COMPARISON:  None. FINDINGS: Frontal pelvis obtained. There is screw and nail fixation in the proximal left femur with  alignment at the previous fracture site essentially anatomic. Screw tip in proximal femoral head. There is no appreciable acute fracture or dislocation. There is moderate symmetric narrowing of each hip joint. There is spurring in the pubic symphysis region. No erosion. There is extensive common femoral, superficial femoral, and profunda femoral artery calcification bilaterally. There is degenerative change in the lower lumbar spine. IMPRESSION: Postoperative change proximal left femur. No acute fracture or dislocation. Symmetric moderate narrowing of each hip joint. Degenerative change in lower lumbar spine. Multiple foci of arterial vascular calcification noted. Electronically Signed   By: Lowella Grip III M.D.   On: 07/13/2020 15:38   DG Hand 2 View Left  Result Date: 07/13/2020 CLINICAL DATA:  Pain following fall EXAM: LEFT HAND - 2 VIEW COMPARISON:  None. FINDINGS: Frontal and lateral views were obtained. No fracture or dislocation. There is osteoarthritic change in all PIP and DIP joints as well as in the first IP joint. No erosive change. There are multiple foci of arterial vascular calcification. IMPRESSION: No acute fracture or dislocation. 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 Foci of vascular calcification noted at several sites. Electronically Signed   By: Lowella Grip III M.D.   On: 07/13/2020 15:40   DG Chest Port 1 View  Result Date: 07/13/2020 CLINICAL DATA:  Pain following fall EXAM: PORTABLE CHEST 1 VIEW COMPARISON:  None FINDINGS: Lungs are clear. Heart is mildly enlarged with pacemaker leads attached to the right atrium and right ventricle. No adenopathy. There is aortic atherosclerosis. No pneumothorax. No bone lesions. IMPRESSION: Cardiomegaly with pacemaker leads attached to right atrium and right ventricle. Lungs clear. No pneumothorax. Aortic Atherosclerosis (ICD10-I70.0). Electronically Signed   By: Lowella Grip III M.D.   On: 07/13/2020 15:39   CT  MAXILLOFACIAL WO CONTRAST  Result Date: 07/13/2020 CLINICAL DATA:  Facial trauma, fall EXAM: CT HEAD WITHOUT CONTRAST CT MAXILLOFACIAL WITHOUT CONTRAST CT CERVICAL SPINE WITHOUT CONTRAST TECHNIQUE: Multidetector CT imaging of the head, cervical spine, and maxillofacial structures were performed using the standard protocol without intravenous contrast. Multiplanar CT image reconstructions of the cervical spine and maxillofacial structures were also generated. COMPARISON:  None. FINDINGS: CT HEAD FINDINGS Brain: Patchy and confluent areas of decreased attenuation are noted throughout the deep and periventricular white matter of the cerebral hemispheres bilaterally, compatible with chronic microvascular ischemic disease. No evidence of large-territorial acute infarction. At least four distinct hyperdense foci within the left frontal lobe measuring 6 x 74mm, 6x4 mm, 2x2 mm, 2x34mm (4:18-21). Possible other punctate foci more superiorly (4:24). These appear to be located at the gray-white matter junction. No definite extra-axial collection. No definite pneumocephalus. No mass effect or midline shift. No hydrocephalus. Basilar cisterns are patent. Vascular: No hyperdense vessel. Atherosclerotic calcifications are present within the cavernous internal carotid and vertebral arteries. Skull: No acute fracture or focal lesion. Other: None. CT MAXILLOFACIAL FINDINGS Osseous: Markedly comminuted and 6 mm depressed left orbital floor fracture. Comminuted nasal process of bilateral maxilla fractures (6:88-92). This is noted to extend posteriorly on the left side to include a minimally displaced fracture of the  anterior wall of the left maxillary sinus (6:77) as well as comminuted fracture of the medial wall of the left maxillary sinus (9:36). Left frontal sinus fracture (10:43) that is minimally displaced. Likely left ethmoid sinus fracture. No definite lamina papyracea fracture. No definite crista galli or cribriform plates  bilaterally. Sinuses/Orbits: Air-fluid level with hyperdense fluid within the left maxillary sinus. Air-fluid level within the left frontal sinus and ethmoid sinuses. Otherwise the remaining paranasal sinuses and mastoid air cells are clear. Bilateral lens replacement. Otherwise the orbits are unremarkable. Soft tissues: Soft tissue edema the left periorbital region with associated subcutaneus soft tissue emphysema and hematoma formation. Soft tissue edema and hematoma extends inferiorly to the left maxillary subcutaneus soft tissues. CT CERVICAL SPINE FINDINGS Alignment: Grade 1 anterolisthesis of C4 on C5, C6 on C7, C7 on T1. Otherwise normal. Skull base and vertebrae: Multilevel degenerative changes of the spine worse at the C5-C6 level. No acute fracture. No aggressive appearing focal osseous lesion or focal pathologic process. Soft tissues and spinal canal: No prevertebral fluid or swelling. No visible canal hematoma. Upper chest: Trace biapical pleural/pulmonary scarring. Other: None. IMPRESSION: 1. At least five distinct subcentimeter foci of intraparenchymal hemorrhage within the left frontal lobe. Underlying mass lesions not excluded. 2. No large territorial ischemic infarction identified. 3. Markedly comminuted and 6 mm depressed left orbital floor fracture. Inferior rectus muscle may be involved. Recommend clinical correlation for entrapment. 4. Minimally displaced anterior wall and comminuted medial wall fracture of the left maxillary sinus. Associated hemorrhage within the left maxillary sinus. 5. Minimally displaced fractures of the left frontal sinus. Possible left ethmoid sinus fracture. 6. Comminuted nasal process of the maxilla fractures bilaterally. 7. Left cribriform plate fracture not excluded. No pneumocephalus identified. 8. No acute displaced fracture or traumatic listhesis of the cervical spine. These results were called by telephone at the time of interpretation on 07/13/2020 at 4:14 pm to  provider Dr. Rex Kras, who verbally acknowledged these results. Electronically Signed   By: Iven Finn M.D.   On: 07/13/2020 16:26      Assessment/Plan  Syncope/LOC pt presented with trauma following unwitnessed fall.  Has SSS with pacemaker- interrogated in ED reportedly with no arrhythmia continue to monitor on telemetry  check orthostatic vital sign   Intraparenchymal hemorrhage in the left frontal lobe Up to 5 distinct subcentimeter foci seen on imaging Patient last dose of Eliquis the evening of 2/24.  She was given Kcentra in the ED for reversal. Per ED physician, neurosurgery recommends no further imaging but just continuous monitoring overnight Frequent neurochecks every 2 hours  Trauma secondary to unwitnessed fall Comminuted and 6 mm depressed left orbital floor fracture- low suspicion for entrapment  Minimally displaced anterior wall and comminuted medial wall fracture of the left maxillary sinus Fracture of the left frontal sinus Possible fracture of the left ethmoid sinus Comminuted nasal process of the maxilla fractures bilateral ?  Left cribriform plate fracture Trauma surgery is following and no further intervention or imaging recommended at this time Facial trauma recommends outpatient follow up  PRN pain management   Hypokalemia replete with IV K in ED. Follow with repeat labs in the AM.   Hx of chronic atrial fibrillation Holding Eliquis due to intraparenchymal hemorrhage.  Pt does not have hx of frequent falls CHA2DS2-VASc of 5 (age, gender, HTN, DM)  Give age and comorbities, would be more benefical to d/c Eliquis. Needs f/u with cardiology Dr. Nehemiah Massed at [facility] in [place]   HTN  continue  home metoprolol succinate   Type 2 diabetes  check HbA1C sensitive sliding scale   DVT prophylaxis:SCD Code Status: DNR-confirmed with patient and daughter Family Communication: Plan discussed with patient at bedside. Daughter Delrae Rend updated over the  phone disposition Plan: Home with at least 2 midnight stays  Consults called:  Admission status: inpatient   Level of care: Progressive  Status is: Observation  The patient remains OBS appropriate and will d/c before 2 midnights.  Dispo: The patient is from: Home              Anticipated d/c is to: Home              Patient currently is not medically stable to d/c.   Difficult to place patient No         Orene Desanctis DO [facility]   If 7PM-7AM, please contact night-coverage [email or web address]   07/13/2020, 8:08 PM

## 2020-07-13 NOTE — Consult Note (Signed)
.   Reason for Consult: Ground level Fall on Eliquis Referring Physician: Dr. [name] is an 87 y.o. female with h/o of Afib on Eliquis with pacemaker, HTN, HLD, T2DM, and GERD who presents as a Level 2 trauma after a mechanical ground-level fall while walking at facility. Fall witnessed by other resident. +LOC. Patient reports that she does not remember the fall and she remembers "waking up inside the ambulance". Complains of head pain and left 3rd and 5th finger pain.  Patient presents in the ED with GCS 15, normal vital signs and lactic acidosis and hypokalemia/hypomagnesemia. CT brain and C-spine with several facial fractures and intraparenchymal hemorrhage of the left frontal lobe.  Trauma surgery was consulted for further recommendations  History reviewed. No pertinent past medical history.  History reviewed. No pertinent surgical history.  History reviewed. No pertinent family history.  Social History:  has no history on file for tobacco use, alcohol use, and drug use.  Allergies: Not on File  Medications: I have reviewed the patient's current medications.  Results for orders placed or performed during the hospital encounter of 07/13/20 (from the past 48 hour(s))  Resp Panel by RT-PCR (Flu A&B, Covid) Nasopharyngeal Swab     Status: None   Collection Time: 07/13/20  2:54 PM   Specimen: Nasopharyngeal Swab; Nasopharyngeal(NP) swabs in vial transport medium  Result Value Ref Range   SARS Coronavirus 2 by RT PCR NEGATIVE NEGATIVE    Comment: (NOTE) SARS-CoV-2 target nucleic acids are NOT DETECTED.  The SARS-CoV-2 RNA is generally detectable in upper respiratory specimens during the acute phase of infection. The lowest concentration of SARS-CoV-2 viral copies this assay can detect is 138 copies/mL. A negative result does not preclude SARS-Cov-2 infection and should not be used as the sole basis for treatment or other patient management decisions. A negative  result may occur with  improper specimen collection/handling, submission of specimen other than nasopharyngeal swab, presence of viral mutation(s) within the areas targeted by this assay, and inadequate number of viral copies(<138 copies/mL). A negative result must be combined with clinical observations, patient history, and epidemiological information. The expected result is Negative.  Fact Sheet for Patients:  EntrepreneurPulse.com.au  Fact Sheet for Healthcare Providers:  IncredibleEmployment.be  This test is no t yet approved or cleared by the [place] FDA and  has been authorized for detection and/or diagnosis of SARS-CoV-2 by FDA under an Emergency Use Authorization (EUA). This EUA will remain  in effect (meaning this test can be used) for the duration of the COVID-19 declaration under Section 564(b)(1) of the Act, 21 U.S.C.section 360bbb-3(b)(1), unless the authorization is terminated  or revoked sooner.       Influenza A by PCR NEGATIVE NEGATIVE   Influenza B by PCR NEGATIVE NEGATIVE    Comment: (NOTE) The Xpert Xpress SARS-CoV-2/FLU/RSV plus assay is intended as an aid in the diagnosis of influenza from Nasopharyngeal swab specimens and should not be used as a sole basis for treatment. Nasal washings and aspirates are unacceptable for Xpert Xpress SARS-CoV-2/FLU/RSV testing.  Fact Sheet for Patients: EntrepreneurPulse.com.au  Fact Sheet for Healthcare Providers: IncredibleEmployment.be  This test is not yet approved or cleared by the [place] FDA and has been authorized for detection and/or diagnosis of SARS-CoV-2 by FDA under an Emergency Use Authorization (EUA). This EUA will remain in effect (meaning this test can be used) for the duration of the COVID-19 declaration under Section 564(b)(1) of the Act, 21 U.S.C. section 360bbb-3(b)(1), unless  the authorization is terminated  or revoked.  Performed at [facility], [place] [address], [place], [place] [address]   Comprehensive metabolic panel     Status: Abnormal   Collection Time: 07/13/20  2:54 PM  Result Value Ref Range   Sodium 141 135 - 145 mmol/L   Potassium 3.0 (L) 3.5 - 5.1 mmol/L   Chloride 104 98 - 111 mmol/L   CO2 24 22 - 32 mmol/L   Glucose, Bld 236 (H) 70 - 99 mg/dL    Comment: Glucose reference range applies only to samples taken after fasting for at least 8 hours.   BUN 22 8 - 23 mg/dL   Creatinine, Ser 0.96 0.44 - 1.00 mg/dL   Calcium 8.7 (L) 8.9 - 10.3 mg/dL   Total Protein 6.5 6.5 - 8.1 g/dL   Albumin 3.6 3.5 - 5.0 g/dL   AST 24 15 - 41 U/L   ALT 19 0 - 44 U/L   Alkaline Phosphatase 50 38 - 126 U/L   Total Bilirubin 1.7 (H) 0.3 - 1.2 mg/dL   GFR, Estimated 54 (L) >60 mL/min    Comment: (NOTE) Calculated using the CKD-EPI Creatinine Equation (2021)    Anion gap 13 5 - 15    Comment: Performed at [facility] [address], [place], [place] [address]  CBC     Status: None   Collection Time: 07/13/20  2:54 PM  Result Value Ref Range   WBC 7.5 4.0 - 10.5 K/uL   RBC 4.51 3.87 - 5.11 MIL/uL   Hemoglobin 14.2 12.0 - 15.0 g/dL   HCT 43.0 36.0 - 46.0 %   MCV 95.3 80.0 - 100.0 fL   MCH 31.5 26.0 - 34.0 pg   MCHC 33.0 30.0 - 36.0 g/dL   RDW 13.1 11.5 - 15.5 %   Platelets 152 150 - 400 K/uL   nRBC 0.0 0.0 - 0.2 %    Comment: Performed at [facility], [place] [address], [place], [place] [address]  Lactic acid, plasma     Status: Abnormal   Collection Time: 07/13/20  2:54 PM  Result Value Ref Range   Lactic Acid, Venous 3.7 (HH) 0.5 - 1.9 mmol/L    Comment: CRITICAL RESULT CALLED TO, READ BACK BY AND VERIFIED WITH:  [name] @1544  07/13/20 K. SANDERS  Performed at [facility], [place] [address] [place], [place] [address]   Protime-INR     Status: None   Collection Time: 07/13/20  2:54 PM  Result Value Ref Range   Prothrombin Time 13.9 11.4 - 15.2 seconds    INR 1.1 0.8 - 1.2    Comment: (NOTE) INR goal varies based on device and disease states. Performed at [facility], [place] [address] [place], [place] [address]   Sample to Blood Bank     Status: None   Collection Time: 07/13/20  3:03 PM  Result Value Ref Range   Blood Bank Specimen SAMPLE AVAILABLE FOR TESTING    Sample Expiration      07/14/2020,2359 Performed at [facility], [place] [address], [place], [place] [address]   I-Stat Chem 8, ED     Status: Abnormal   Collection Time: 07/13/20  3:09 PM  Result Value Ref Range   Sodium 142 135 - 145 mmol/L   Potassium 2.9 (L) 3.5 - 5.1 mmol/L   Chloride 103 98 - 111 mmol/L   BUN 24 (H) 8 -  23 mg/dL   Creatinine, Ser 0.80 0.44 - 1.00 mg/dL   Glucose, Bld 227 (H) 70 - 99 mg/dL    Comment: Glucose reference range applies only to samples taken after fasting for at least 8 hours.   Calcium, Ion 1.11 (L) 1.15 - 1.40 mmol/L   TCO2 24 22 - 32 mmol/L   Hemoglobin 15.0 12.0 - 15.0 g/dL   HCT 44.0 36.0 - 46.0 %  Magnesium     Status: None   Collection Time: 07/13/20  3:13 PM  Result Value Ref Range   Magnesium 1.8 1.7 - 2.4 mg/dL    Comment: Performed at [facility] [address], [place], [place] [address]    CT HEAD WO CONTRAST  Result Date: 07/13/2020 CLINICAL DATA:  Facial trauma, fall EXAM: CT HEAD WITHOUT CONTRAST CT MAXILLOFACIAL WITHOUT CONTRAST CT CERVICAL SPINE WITHOUT CONTRAST TECHNIQUE: Multidetector CT imaging of the head, cervical spine, and maxillofacial structures were performed using the standard protocol without intravenous contrast. Multiplanar CT image reconstructions of the cervical spine and maxillofacial structures were also generated. COMPARISON:  None. FINDINGS: CT HEAD FINDINGS Brain: Patchy and confluent areas of decreased attenuation are noted throughout the deep and periventricular white matter of the cerebral hemispheres bilaterally, compatible with chronic microvascular ischemic disease. No  evidence of large-territorial acute infarction. At least four distinct hyperdense foci within the left frontal lobe measuring 6 x 6mm, 6x4 mm, 2x2 mm, 2x50mm (4:18-21). Possible other punctate foci more superiorly (4:24). These appear to be located at the gray-white matter junction. No definite extra-axial collection. No definite pneumocephalus. No mass effect or midline shift. No hydrocephalus. Basilar cisterns are patent. Vascular: No hyperdense vessel. Atherosclerotic calcifications are present within the cavernous internal carotid and vertebral arteries. Skull: No acute fracture or focal lesion. Other: None. CT MAXILLOFACIAL FINDINGS Osseous: Markedly comminuted and 6 mm depressed left orbital floor fracture. Comminuted nasal process of bilateral maxilla fractures (6:88-92). This is noted to extend posteriorly on the left side to include a minimally displaced fracture of the anterior wall of the left maxillary sinus (6:77) as well as comminuted fracture of the medial wall of the left maxillary sinus (9:36). Left frontal sinus fracture (10:43) that is minimally displaced. Likely left ethmoid sinus fracture. No definite lamina papyracea fracture. No definite crista galli or cribriform plates bilaterally. Sinuses/Orbits: Air-fluid level with hyperdense fluid within the left maxillary sinus. Air-fluid level within the left frontal sinus and ethmoid sinuses. Otherwise the remaining paranasal sinuses and mastoid air cells are clear. Bilateral lens replacement. Otherwise the orbits are unremarkable. Soft tissues: Soft tissue edema the left periorbital region with associated subcutaneus soft tissue emphysema and hematoma formation. Soft tissue edema and hematoma extends inferiorly to the left maxillary subcutaneus soft tissues. CT CERVICAL SPINE FINDINGS Alignment: Grade 1 anterolisthesis of C4 on C5, C6 on C7, C7 on T1. Otherwise normal. Skull base and vertebrae: Multilevel degenerative changes of the spine worse at the  C5-C6 level. No acute fracture. No aggressive appearing focal osseous lesion or focal pathologic process. Soft tissues and spinal canal: No prevertebral fluid or swelling. No visible canal hematoma. Upper chest: Trace biapical pleural/pulmonary scarring. Other: None. IMPRESSION: 1. At least five distinct subcentimeter foci of intraparenchymal hemorrhage within the left frontal lobe. Underlying mass lesions not excluded. 2. No large territorial ischemic infarction identified. 3. Markedly comminuted and 6 mm depressed left orbital floor fracture. Inferior rectus muscle may be involved. Recommend clinical correlation for entrapment. 4. Minimally displaced anterior wall and comminuted  medial wall fracture of the left maxillary sinus. Associated hemorrhage within the left maxillary sinus. 5. Minimally displaced fractures of the left frontal sinus. Possible left ethmoid sinus fracture. 6. Comminuted nasal process of the maxilla fractures bilaterally. 7. Left cribriform plate fracture not excluded. No pneumocephalus identified. 8. No acute displaced fracture or traumatic listhesis of the cervical spine. These results were called by telephone at the time of interpretation on 07/13/2020 at 4:14 pm to provider Dr. Rex Kras, who verbally acknowledged these results. Electronically Signed   By: Iven Finn M.D.   On: 07/13/2020 16:26   CT CERVICAL SPINE WO CONTRAST  Result Date: 07/13/2020 CLINICAL DATA:  Facial trauma, fall EXAM: CT HEAD WITHOUT CONTRAST CT MAXILLOFACIAL WITHOUT CONTRAST CT CERVICAL SPINE WITHOUT CONTRAST TECHNIQUE: Multidetector CT imaging of the head, cervical spine, and maxillofacial structures were performed using the standard protocol without intravenous contrast. Multiplanar CT image reconstructions of the cervical spine and maxillofacial structures were also generated. COMPARISON:  None. FINDINGS: CT HEAD FINDINGS Brain: Patchy and confluent areas of decreased attenuation are noted throughout the  deep and periventricular white matter of the cerebral hemispheres bilaterally, compatible with chronic microvascular ischemic disease. No evidence of large-territorial acute infarction. At least four distinct hyperdense foci within the left frontal lobe measuring 6 x 19mm, 6x4 mm, 2x2 mm, 2x24mm (4:18-21). Possible other punctate foci more superiorly (4:24). These appear to be located at the gray-white matter junction. No definite extra-axial collection. No definite pneumocephalus. No mass effect or midline shift. No hydrocephalus. Basilar cisterns are patent. Vascular: No hyperdense vessel. Atherosclerotic calcifications are present within the cavernous internal carotid and vertebral arteries. Skull: No acute fracture or focal lesion. Other: None. CT MAXILLOFACIAL FINDINGS Osseous: Markedly comminuted and 6 mm depressed left orbital floor fracture. Comminuted nasal process of bilateral maxilla fractures (6:88-92). This is noted to extend posteriorly on the left side to include a minimally displaced fracture of the anterior wall of the left maxillary sinus (6:77) as well as comminuted fracture of the medial wall of the left maxillary sinus (9:36). Left frontal sinus fracture (10:43) that is minimally displaced. Likely left ethmoid sinus fracture. No definite lamina papyracea fracture. No definite crista galli or cribriform plates bilaterally. Sinuses/Orbits: Air-fluid level with hyperdense fluid within the left maxillary sinus. Air-fluid level within the left frontal sinus and ethmoid sinuses. Otherwise the remaining paranasal sinuses and mastoid air cells are clear. Bilateral lens replacement. Otherwise the orbits are unremarkable. Soft tissues: Soft tissue edema the left periorbital region with associated subcutaneus soft tissue emphysema and hematoma formation. Soft tissue edema and hematoma extends inferiorly to the left maxillary subcutaneus soft tissues. CT CERVICAL SPINE FINDINGS Alignment: Grade 1  anterolisthesis of C4 on C5, C6 on C7, C7 on T1. Otherwise normal. Skull base and vertebrae: Multilevel degenerative changes of the spine worse at the C5-C6 level. No acute fracture. No aggressive appearing focal osseous lesion or focal pathologic process. Soft tissues and spinal canal: No prevertebral fluid or swelling. No visible canal hematoma. Upper chest: Trace biapical pleural/pulmonary scarring. Other: None. IMPRESSION: 1. At least five distinct subcentimeter foci of intraparenchymal hemorrhage within the left frontal lobe. Underlying mass lesions not excluded. 2. No large territorial ischemic infarction identified. 3. Markedly comminuted and 6 mm depressed left orbital floor fracture. Inferior rectus muscle may be involved. Recommend clinical correlation for entrapment. 4. Minimally displaced anterior wall and comminuted medial wall fracture of the left maxillary sinus. Associated hemorrhage within the left maxillary sinus. 5. Minimally displaced fractures of the  left frontal sinus. Possible left ethmoid sinus fracture. 6. Comminuted nasal process of the maxilla fractures bilaterally. 7. Left cribriform plate fracture not excluded. No pneumocephalus identified. 8. No acute displaced fracture or traumatic listhesis of the cervical spine. These results were called by telephone at the time of interpretation on 07/13/2020 at 4:14 pm to provider Dr. Rex Kras, who verbally acknowledged these results. Electronically Signed   By: Iven Finn M.D.   On: 07/13/2020 16:26   DG Pelvis Portable  Result Date: 07/13/2020 CLINICAL DATA:  Pain following fall EXAM: PORTABLE PELVIS 1-2 VIEWS COMPARISON:  None. FINDINGS: Frontal pelvis obtained. There is screw and nail fixation in the proximal left femur with alignment at the previous fracture site essentially anatomic. Screw tip in proximal femoral head. There is no appreciable acute fracture or dislocation. There is moderate symmetric narrowing of each hip joint. There  is spurring in the pubic symphysis region. No erosion. There is extensive common femoral, superficial femoral, and profunda femoral artery calcification bilaterally. There is degenerative change in the lower lumbar spine. IMPRESSION: Postoperative change proximal left femur. No acute fracture or dislocation. Symmetric moderate narrowing of each hip joint. Degenerative change in lower lumbar spine. Multiple foci of arterial vascular calcification noted. Electronically Signed   By: Lowella Grip III M.D.   On: 07/13/2020 15:38   DG Hand 2 View Left  Result Date: 07/13/2020 CLINICAL DATA:  Pain following fall EXAM: LEFT HAND - 2 VIEW COMPARISON:  None. FINDINGS: Frontal and lateral views were obtained. No fracture or dislocation. There is osteoarthritic change in all PIP and DIP joints as well as in the first IP joint. No erosive change. There are multiple foci of arterial vascular calcification. IMPRESSION: No acute fracture or dislocation. 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 Foci of vascular calcification noted at several sites. Electronically Signed   By: Lowella Grip III M.D.   On: 07/13/2020 15:40   DG Chest Port 1 View  Result Date: 07/13/2020 CLINICAL DATA:  Pain following fall EXAM: PORTABLE CHEST 1 VIEW COMPARISON:  None FINDINGS: Lungs are clear. Heart is mildly enlarged with pacemaker leads attached to the right atrium and right ventricle. No adenopathy. There is aortic atherosclerosis. No pneumothorax. No bone lesions. IMPRESSION: Cardiomegaly with pacemaker leads attached to right atrium and right ventricle. Lungs clear. No pneumothorax. Aortic Atherosclerosis (ICD10-I70.0). Electronically Signed   By: Lowella Grip III M.D.   On: 07/13/2020 15:39   CT MAXILLOFACIAL WO CONTRAST  Result Date: 07/13/2020 CLINICAL DATA:  Facial trauma, fall EXAM: CT HEAD WITHOUT CONTRAST CT MAXILLOFACIAL WITHOUT CONTRAST CT CERVICAL SPINE WITHOUT CONTRAST TECHNIQUE: Multidetector CT  imaging of the head, cervical spine, and maxillofacial structures were performed using the standard protocol without intravenous contrast. Multiplanar CT image reconstructions of the cervical spine and maxillofacial structures were also generated. COMPARISON:  None. FINDINGS: CT HEAD FINDINGS Brain: Patchy and confluent areas of decreased attenuation are noted throughout the deep and periventricular white matter of the cerebral hemispheres bilaterally, compatible with chronic microvascular ischemic disease. No evidence of large-territorial acute infarction. At least four distinct hyperdense foci within the left frontal lobe measuring 6 x 33mm, 6x4 mm, 2x2 mm, 2x24mm (4:18-21). Possible other punctate foci more superiorly (4:24). These appear to be located at the gray-white matter junction. No definite extra-axial collection. No definite pneumocephalus. No mass effect or midline shift. No hydrocephalus. Basilar cisterns are patent. Vascular: No hyperdense vessel. Atherosclerotic calcifications are present within the cavernous internal carotid and vertebral arteries. Skull: No acute  fracture or focal lesion. Other: None. CT MAXILLOFACIAL FINDINGS Osseous: Markedly comminuted and 6 mm depressed left orbital floor fracture. Comminuted nasal process of bilateral maxilla fractures (6:88-92). This is noted to extend posteriorly on the left side to include a minimally displaced fracture of the anterior wall of the left maxillary sinus (6:77) as well as comminuted fracture of the medial wall of the left maxillary sinus (9:36). Left frontal sinus fracture (10:43) that is minimally displaced. Likely left ethmoid sinus fracture. No definite lamina papyracea fracture. No definite crista galli or cribriform plates bilaterally. Sinuses/Orbits: Air-fluid level with hyperdense fluid within the left maxillary sinus. Air-fluid level within the left frontal sinus and ethmoid sinuses. Otherwise the remaining paranasal sinuses and mastoid  air cells are clear. Bilateral lens replacement. Otherwise the orbits are unremarkable. Soft tissues: Soft tissue edema the left periorbital region with associated subcutaneus soft tissue emphysema and hematoma formation. Soft tissue edema and hematoma extends inferiorly to the left maxillary subcutaneus soft tissues. CT CERVICAL SPINE FINDINGS Alignment: Grade 1 anterolisthesis of C4 on C5, C6 on C7, C7 on T1. Otherwise normal. Skull base and vertebrae: Multilevel degenerative changes of the spine worse at the C5-C6 level. No acute fracture. No aggressive appearing focal osseous lesion or focal pathologic process. Soft tissues and spinal canal: No prevertebral fluid or swelling. No visible canal hematoma. Upper chest: Trace biapical pleural/pulmonary scarring. Other: None. IMPRESSION: 1. At least five distinct subcentimeter foci of intraparenchymal hemorrhage within the left frontal lobe. Underlying mass lesions not excluded. 2. No large territorial ischemic infarction identified. 3. Markedly comminuted and 6 mm depressed left orbital floor fracture. Inferior rectus muscle may be involved. Recommend clinical correlation for entrapment. 4. Minimally displaced anterior wall and comminuted medial wall fracture of the left maxillary sinus. Associated hemorrhage within the left maxillary sinus. 5. Minimally displaced fractures of the left frontal sinus. Possible left ethmoid sinus fracture. 6. Comminuted nasal process of the maxilla fractures bilaterally. 7. Left cribriform plate fracture not excluded. No pneumocephalus identified. 8. No acute displaced fracture or traumatic listhesis of the cervical spine. These results were called by telephone at the time of interpretation on 07/13/2020 at 4:14 pm to provider Dr. Rex Kras, who verbally acknowledged these results. Electronically Signed   By: Iven Finn M.D.   On: 07/13/2020 16:26    ROS - all of the below systems have been reviewed with the patient and positives  are indicated with bold text General: chills, fever or night sweats Eyes: blurry vision or double vision ENT: epistaxis or sore throat Allergy/Immunology: itchy/watery eyes or nasal congestion Hematologic/Lymphatic: on chronic anticoagulation (Eliquis), blood clots or swollen lymph nodes Endocrine: temperature intolerance or unexpected weight changes Breast: new or changing breast lumps or nipple discharge Resp: cough, shortness of breath, or wheezing CV: chest pain or dyspnea on exertion GI: no constipation or diarrhea GU: dysuria, trouble voiding, or hematuria MSK: joint pain or joint stiffness Neuro: TIA or stroke symptoms Derm: pruritus and skin lesion changes Psych: anxiety and depression  PE Blood pressure (!) 172/90, pulse 88, temperature 98.2 F (36.8 C), temperature source Oral, resp. rate (!) 23, height 5\' 2"  (1.575 m), weight 56.7 kg, SpO2 98 %. Constitutional: NAD; conversant; no deformities Eyes: Significant left periorbital edema and ecchymosis. Moist conjunctiva; no lid lag; anicteric; PERRL. Able to move her eye in all four quadrants.  Neck: Trachea midline; no thyromegaly Lungs: Normal respiratory effort; no tactile fremitus CV: RRR; no palpable thrills; no pitting edema GI: Abd, soft, nontender, nondistended,  no palpable hepatosplenomegaly ZOX:WRUEAV gait; no clubbing/cyanosis Psychiatric: Appropriate affect; alert and oriented x3 Lymphatic: No palpable cervical or axillary lymphadenopathy    Assessment: Kara [name] is a 87 y.o. female  with h/o of Afib on Eliquis with pacemaker, HTN, HLD, T2DM, and GERD who presents as a Level 2 trauma after a mechanical ground-level fall while walking at facility.  List of injuries: - Left frontal lobe intraparenchymal hemorrhage - L orbital floor fracture (?inferior rectus muscle involvement) - L maxillary sinus fracture - L frontal sinus fracture - Comminuted nasal process of the maxilla fractures bilaterally. - ?L  esthmoid sinus fracture  Plan and recommendations: - Admit to medicine - Consult to neurosurgery for IPH; repeat CT brain may be indicated after 6 hours from initial one due to patient being on active anticoagulation. - Consult to ophthalmology if neurosurgery is concerned for left eye entrapment (low clinical suspicion during our exam) - Serial abdominal exams - Correct electrolyte abnormalities as indicated - No indication for additional imaging studies at this point. - Trauma surgery will follow.    07/13/2020, 5:13 PM

## 2020-07-13 NOTE — ED Notes (Signed)
Pacemaker interrogation successful

## 2020-07-13 NOTE — Progress Notes (Signed)
Orthopedic Tech Progress Note Patient Details:  Kara [name] 04-07-1923 [id number] Level 2 trauma Patient ID: [name], female   DOB: March 08, 1923, 87 y.o.   MRN: [id number]   Janit Pagan 07/13/2020, 2:53 PM

## 2020-07-13 NOTE — ED Triage Notes (Signed)
Pt BIB [facility] from facility. Patient tripped and fell while walking at facility, witnessed by other resident. Patient with unknown LOC. Patient VSS.

## 2020-07-13 NOTE — ED Notes (Signed)
Pt asking for pain meds. Dr. Flossie Buffy notified

## 2020-07-13 NOTE — Progress Notes (Signed)
Pt admitted from ED S/P Fall with head trauma, pt alert and oriented, denies any pain at this time, settled in bed with call light within pt's reach, tele monitor put and verified on pt, safety measures initiated accordingly, pt was however reassured and will continue to monitor, v/s stable. Obasogie-Asidi, Roberta Angell Efe

## 2020-07-13 NOTE — ED Notes (Signed)
Mag level to be added on to blood already collected

## 2020-07-13 NOTE — ED Notes (Signed)
Attempted to give reportx1 

## 2020-07-13 NOTE — ED Notes (Signed)
Please call daug. With an update here name is [id number] [phone]

## 2020-07-13 NOTE — ED Notes (Signed)
Critical lactic 3.7 notified MD & RN

## 2020-07-14 ENCOUNTER — Observation Stay (HOSPITAL_COMMUNITY): Payer: Medicare Other

## 2020-07-14 DIAGNOSIS — E119 Type 2 diabetes mellitus without complications: Secondary | ICD-10-CM | POA: Diagnosis present

## 2020-07-14 DIAGNOSIS — Z20822 Contact with and (suspected) exposure to covid-19: Secondary | ICD-10-CM | POA: Diagnosis present

## 2020-07-14 DIAGNOSIS — Z23 Encounter for immunization: Secondary | ICD-10-CM | POA: Diagnosis not present

## 2020-07-14 DIAGNOSIS — I495 Sick sinus syndrome: Secondary | ICD-10-CM | POA: Diagnosis present

## 2020-07-14 DIAGNOSIS — S06359A Traumatic hemorrhage of left cerebrum with loss of consciousness of unspecified duration, initial encounter: Secondary | ICD-10-CM | POA: Diagnosis present

## 2020-07-14 DIAGNOSIS — I619 Nontraumatic intracerebral hemorrhage, unspecified: Secondary | ICD-10-CM | POA: Diagnosis not present

## 2020-07-14 DIAGNOSIS — S0285XA Fracture of orbit, unspecified, initial encounter for closed fracture: Secondary | ICD-10-CM | POA: Diagnosis not present

## 2020-07-14 DIAGNOSIS — K219 Gastro-esophageal reflux disease without esophagitis: Secondary | ICD-10-CM | POA: Diagnosis present

## 2020-07-14 DIAGNOSIS — S022XXA Fracture of nasal bones, initial encounter for closed fracture: Secondary | ICD-10-CM | POA: Diagnosis present

## 2020-07-14 DIAGNOSIS — S065X0A Traumatic subdural hemorrhage without loss of consciousness, initial encounter: Secondary | ICD-10-CM | POA: Diagnosis not present

## 2020-07-14 DIAGNOSIS — I482 Chronic atrial fibrillation, unspecified: Secondary | ICD-10-CM | POA: Diagnosis present

## 2020-07-14 DIAGNOSIS — W010XXA Fall on same level from slipping, tripping and stumbling without subsequent striking against object, initial encounter: Secondary | ICD-10-CM | POA: Diagnosis present

## 2020-07-14 DIAGNOSIS — Z8542 Personal history of malignant neoplasm of other parts of uterus: Secondary | ICD-10-CM | POA: Diagnosis not present

## 2020-07-14 DIAGNOSIS — S0240DA Maxillary fracture, left side, initial encounter for closed fracture: Secondary | ICD-10-CM | POA: Diagnosis present

## 2020-07-14 DIAGNOSIS — Z66 Do not resuscitate: Secondary | ICD-10-CM | POA: Diagnosis present

## 2020-07-14 DIAGNOSIS — Y92129 Unspecified place in nursing home as the place of occurrence of the external cause: Secondary | ICD-10-CM | POA: Diagnosis not present

## 2020-07-14 DIAGNOSIS — I10 Essential (primary) hypertension: Secondary | ICD-10-CM | POA: Diagnosis present

## 2020-07-14 DIAGNOSIS — S066X0A Traumatic subarachnoid hemorrhage without loss of consciousness, initial encounter: Secondary | ICD-10-CM | POA: Diagnosis not present

## 2020-07-14 DIAGNOSIS — S0232XA Fracture of orbital floor, left side, initial encounter for closed fracture: Secondary | ICD-10-CM | POA: Diagnosis present

## 2020-07-14 DIAGNOSIS — S0219XA Other fracture of base of skull, initial encounter for closed fracture: Secondary | ICD-10-CM | POA: Diagnosis present

## 2020-07-14 DIAGNOSIS — Z923 Personal history of irradiation: Secondary | ICD-10-CM | POA: Diagnosis not present

## 2020-07-14 DIAGNOSIS — E876 Hypokalemia: Secondary | ICD-10-CM | POA: Diagnosis present

## 2020-07-14 DIAGNOSIS — R402412 Glasgow coma scale score 13-15, at arrival to emergency department: Secondary | ICD-10-CM | POA: Diagnosis present

## 2020-07-14 DIAGNOSIS — E785 Hyperlipidemia, unspecified: Secondary | ICD-10-CM | POA: Diagnosis present

## 2020-07-14 DIAGNOSIS — R55 Syncope and collapse: Secondary | ICD-10-CM | POA: Diagnosis not present

## 2020-07-14 DIAGNOSIS — W19XXXA Unspecified fall, initial encounter: Secondary | ICD-10-CM | POA: Diagnosis not present

## 2020-07-14 DIAGNOSIS — S06890A Other specified intracranial injury without loss of consciousness, initial encounter: Secondary | ICD-10-CM | POA: Diagnosis not present

## 2020-07-14 DIAGNOSIS — E1140 Type 2 diabetes mellitus with diabetic neuropathy, unspecified: Secondary | ICD-10-CM | POA: Diagnosis not present

## 2020-07-14 DIAGNOSIS — Z950 Presence of cardiac pacemaker: Secondary | ICD-10-CM | POA: Diagnosis not present

## 2020-07-14 DIAGNOSIS — E872 Acidosis: Secondary | ICD-10-CM | POA: Diagnosis present

## 2020-07-14 DIAGNOSIS — S0292XA Unspecified fracture of facial bones, initial encounter for closed fracture: Secondary | ICD-10-CM | POA: Diagnosis not present

## 2020-07-14 LAB — BASIC METABOLIC PANEL
Anion gap: 10 (ref 5–15)
BUN: 17 mg/dL (ref 8–23)
CO2: 26 mmol/L (ref 22–32)
Calcium: 8.2 mg/dL — ABNORMAL LOW (ref 8.9–10.3)
Chloride: 101 mmol/L (ref 98–111)
Creatinine, Ser: 0.86 mg/dL (ref 0.44–1.00)
GFR, Estimated: >60 mL/min (ref >60)
Glucose, Bld: 129 mg/dL — ABNORMAL HIGH (ref 70–99)
Potassium: 3 mmol/L — ABNORMAL LOW (ref 3.5–5.1)
Sodium: 137 mmol/L (ref 135–145)

## 2020-07-14 LAB — GLUCOSE, CAPILLARY
Glucose-Capillary: 113 mg/dL — ABNORMAL HIGH (ref 70–99)
Glucose-Capillary: 177 mg/dL — ABNORMAL HIGH (ref 70–99)
Glucose-Capillary: 140 mg/dL — ABNORMAL HIGH (ref 70–99)
Glucose-Capillary: 152 mg/dL — ABNORMAL HIGH (ref 70–99)

## 2020-07-14 LAB — HEMOGLOBIN A1C
Hgb A1c MFr Bld: 6.3 % — ABNORMAL HIGH (ref 4.8–5.6)
Mean Plasma Glucose: 134.11 mg/dL

## 2020-07-14 LAB — CBC
HCT: 35.1 % — ABNORMAL LOW (ref 36.0–46.0)
Hemoglobin: 12.2 g/dL (ref 12.0–15.0)
MCH: 32 pg (ref 26.0–34.0)
MCHC: 34.8 g/dL (ref 30.0–36.0)
MCV: 92.1 fL (ref 80.0–100.0)
Platelets: 138 10*3/uL — ABNORMAL LOW (ref 150–400)
RBC: 3.81 MIL/uL — ABNORMAL LOW (ref 3.87–5.11)
RDW: 13.1 % (ref 11.5–15.5)
WBC: 8.6 10*3/uL (ref 4.0–10.5)
nRBC: 0 % (ref 0.0–0.2)

## 2020-07-14 LAB — LACTIC ACID, PLASMA: Lactic Acid, Venous: 2.5 mmol/L (ref 0.5–1.9)

## 2020-07-14 MED ORDER — POTASSIUM CHLORIDE 10 MEQ/100ML IV SOLN
10.0000 meq | INTRAVENOUS | Status: AC
  Administered 2020-07-14: 10 meq via INTRAVENOUS
  Filled 2020-07-14 (×2): qty 100

## 2020-07-14 MED ORDER — LOPERAMIDE HCL 2 MG PO CAPS
2.0000 mg | ORAL_CAPSULE | ORAL | Status: DC | PRN
  Administered 2020-07-14: 2 mg via ORAL
  Filled 2020-07-14: qty 1

## 2020-07-14 MED ORDER — MAGNESIUM SULFATE 2 GM/50ML IV SOLN
2.0000 g | Freq: Once | INTRAVENOUS | Status: AC
  Administered 2020-07-14: 2 g via INTRAVENOUS
  Filled 2020-07-14: qty 50

## 2020-07-14 MED ORDER — POTASSIUM CHLORIDE 20 MEQ PO PACK
40.0000 meq | PACK | Freq: Two times a day (BID) | ORAL | Status: AC
  Administered 2020-07-14 (×2): 40 meq via ORAL
  Filled 2020-07-14 (×2): qty 2

## 2020-07-14 NOTE — Consult Note (Signed)
Facial Trauma Consult Note    Name: Kara [name] MRN: [id number]  Date:  07/13/2020            DOB: [birth year]  Patient is a 87 year old female who was discussed with ED provider on 07/13/2020. CT scan was personally reviewed and clinical exam was discussed with ED provider. There is no entrapment visualized on CT scan on personal read nor any restriction clinically or diplopia per ED provider. ED provider advised that Orbital floor, nasal, and sinus wall fractures will be managed non-operatively with follow up in 2 weeks for evaluation once edema has subsided.     Per Radiology review: - Left frontal lobe intraparenchymal hemorrhage - L orbital floor fracture (?inferior rectus muscle involvement) - L maxillary sinus fracture - L frontal sinus fracture -Comminuted nasal process of the maxilla fractures bilaterally. - ?L esthmoid sinus fracture  Recommendations:  - Patient should be on sinus precautions including no straws and no nose blowing  - Please have patient follow outpatient at the address below in 2 weeks. - Defer pain management to primary team  Follow up: Patient should follow up in 2 weeks at our office below. Please provide contact information to patient  Dr. Fabian Sharp  [facility]  [place], [place]  [phone]

## 2020-07-14 NOTE — Progress Notes (Signed)
Subjective/Chief Complaint: No complaints other than some soreness in a finger and cloudy left eye   Objective: Vital signs in last 24 hours: Temp:  [97.9 F (36.6 C)-98.2 F (36.8 C)] 97.9 F (36.6 C) (02/26 0743) Pulse Rate:  [77-105] 83 (02/26 0743) Resp:  [15-27] 20 (02/26 0743) BP: (101-187)/(50-148) 130/56 (02/26 0743) SpO2:  [92 %-98 %] 93 % (02/26 0743) Weight:  [56.1 kg-56.7 kg] 56.1 kg (02/25 2100) Last BM Date: 07/13/20  Intake/Output from previous day: 02/25 0701 - 02/26 0700 In: -  Out: 1400 [Urine:1400] Intake/Output this shift: No intake/output data recorded.  General appearance: alert and cooperative Eyes: PERRL, EOMI. bruising around left eye Resp: clear to auscultation bilaterally Cardio: regular rate and rhythm GI: soft, non-tender; bowel sounds normal; no masses,  no organomegaly  Lab Results:  Recent Labs    07/13/20 1454 07/13/20 1509 07/14/20 0317  WBC 7.5  --  8.6  HGB 14.2 15.0 12.2  HCT 43.0 44.0 35.1*  PLT 152  --  138*   BMET Recent Labs    07/13/20 1454 07/13/20 1509 07/14/20 0317  NA 141 142 137  K 3.0* 2.9* 3.0*  CL 104 103 101  CO2 24  --  26  GLUCOSE 236* 227* 129*  BUN 22 24* 17  CREATININE 0.96 0.80 0.86  CALCIUM 8.7*  --  8.2*   PT/INR Recent Labs    07/13/20 1454  LABPROT 13.9  INR 1.1   ABG No results for input(s): PHART, HCO3 in the last 72 hours.  Invalid input(s): PCO2, PO2  Studies/Results: CT HEAD WO CONTRAST  Result Date: 07/14/2020 CLINICAL DATA:  87 year old female status post fall with posttraumatic shear hemorrhages or hemorrhagic contusions. EXAM: CT HEAD WITHOUT CONTRAST TECHNIQUE: Contiguous axial images were obtained from the base of the skull through the vertex without intravenous contrast. COMPARISON:  Head CT 07/13/2020. FINDINGS: Brain: There are trace bilateral subdural hematomas or less likely posttraumatic subdural hygromas (coronal image 24 and series 3, image 17. These are more  apparent since yesterday. Minimal leftward midline shift is stable. Basilar cisterns remain normal. Several small white matter and gray-white matter junction parenchymal hemorrhages in the anterior left frontal lobe are stable since yesterday. Possible small surface hemorrhagic contusion or trace subarachnoid hemorrhage on series 3, image 20. No significant left frontal lobe edema or mass effect. No new parenchymal hemorrhage. Stable gray-white matter differentiation elsewhere. Trace subarachnoid hemorrhages layering in both sylvian fissures. No intraventricular hemorrhage identified. No ventriculomegaly. No cortically based acute infarct identified. Vascular: Extensive Calcified atherosclerosis at the skull base. Skull: Left orbital floor and nondisplaced posterior left maxillary sinus fractures are visible. No calvarium fracture identified. Sinuses/Orbits: Stable hemorrhage in the left maxillary sinus. Stable small volume hemorrhage layering in the left frontal sinus. Mild left ethmoid fluid is stable. Right paranasal sinuses, tympanic cavities and mastoids remain clear. Other: Left face and scalp contusion/hematoma. Globes and intraorbital soft tissues remain normal. IMPRESSION: 1. There are trace bilateral subdural hematomas, more apparent than yesterday. Minimal associated leftward midline shift is stable. 2. Several small anterior left frontal lobe shear hemorrhages or hemorrhagic contusions are stable since yesterday. Associated trace subarachnoid hemorrhage not significantly changed. 3. No new intracranial abnormality. 4. Left orbit and maxilla fractures with hemorrhage in the left paranasal sinuses again noted. No calvarium fracture identified. Electronically Signed   By: Genevie Ann M.D.   On: 07/14/2020 07:32   CT HEAD WO CONTRAST  Result Date: 07/13/2020 CLINICAL DATA:  Facial trauma, fall  EXAM: CT HEAD WITHOUT CONTRAST CT MAXILLOFACIAL WITHOUT CONTRAST CT CERVICAL SPINE WITHOUT CONTRAST TECHNIQUE:  Multidetector CT imaging of the head, cervical spine, and maxillofacial structures were performed using the standard protocol without intravenous contrast. Multiplanar CT image reconstructions of the cervical spine and maxillofacial structures were also generated. COMPARISON:  None. FINDINGS: CT HEAD FINDINGS Brain: Patchy and confluent areas of decreased attenuation are noted throughout the deep and periventricular white matter of the cerebral hemispheres bilaterally, compatible with chronic microvascular ischemic disease. No evidence of large-territorial acute infarction. At least four distinct hyperdense foci within the left frontal lobe measuring 6 x 71mm, 6x4 mm, 2x2 mm, 2x34mm (4:18-21). Possible other punctate foci more superiorly (4:24). These appear to be located at the gray-white matter junction. No definite extra-axial collection. No definite pneumocephalus. No mass effect or midline shift. No hydrocephalus. Basilar cisterns are patent. Vascular: No hyperdense vessel. Atherosclerotic calcifications are present within the cavernous internal carotid and vertebral arteries. Skull: No acute fracture or focal lesion. Other: None. CT MAXILLOFACIAL FINDINGS Osseous: Markedly comminuted and 6 mm depressed left orbital floor fracture. Comminuted nasal process of bilateral maxilla fractures (6:88-92). This is noted to extend posteriorly on the left side to include a minimally displaced fracture of the anterior wall of the left maxillary sinus (6:77) as well as comminuted fracture of the medial wall of the left maxillary sinus (9:36). Left frontal sinus fracture (10:43) that is minimally displaced. Likely left ethmoid sinus fracture. No definite lamina papyracea fracture. No definite crista galli or cribriform plates bilaterally. Sinuses/Orbits: Air-fluid level with hyperdense fluid within the left maxillary sinus. Air-fluid level within the left frontal sinus and ethmoid sinuses. Otherwise the remaining paranasal  sinuses and mastoid air cells are clear. Bilateral lens replacement. Otherwise the orbits are unremarkable. Soft tissues: Soft tissue edema the left periorbital region with associated subcutaneus soft tissue emphysema and hematoma formation. Soft tissue edema and hematoma extends inferiorly to the left maxillary subcutaneus soft tissues. CT CERVICAL SPINE FINDINGS Alignment: Grade 1 anterolisthesis of C4 on C5, C6 on C7, C7 on T1. Otherwise normal. Skull base and vertebrae: Multilevel degenerative changes of the spine worse at the C5-C6 level. No acute fracture. No aggressive appearing focal osseous lesion or focal pathologic process. Soft tissues and spinal canal: No prevertebral fluid or swelling. No visible canal hematoma. Upper chest: Trace biapical pleural/pulmonary scarring. Other: None. IMPRESSION: 1. At least five distinct subcentimeter foci of intraparenchymal hemorrhage within the left frontal lobe. Underlying mass lesions not excluded. 2. No large territorial ischemic infarction identified. 3. Markedly comminuted and 6 mm depressed left orbital floor fracture. Inferior rectus muscle may be involved. Recommend clinical correlation for entrapment. 4. Minimally displaced anterior wall and comminuted medial wall fracture of the left maxillary sinus. Associated hemorrhage within the left maxillary sinus. 5. Minimally displaced fractures of the left frontal sinus. Possible left ethmoid sinus fracture. 6. Comminuted nasal process of the maxilla fractures bilaterally. 7. Left cribriform plate fracture not excluded. No pneumocephalus identified. 8. No acute displaced fracture or traumatic listhesis of the cervical spine. These results were called by telephone at the time of interpretation on 07/13/2020 at 4:14 pm to provider Dr. Rex Kras, who verbally acknowledged these results. Electronically Signed   By: Iven Finn M.D.   On: 07/13/2020 16:26   CT CERVICAL SPINE WO CONTRAST  Result Date:  07/13/2020 CLINICAL DATA:  Facial trauma, fall EXAM: CT HEAD WITHOUT CONTRAST CT MAXILLOFACIAL WITHOUT CONTRAST CT CERVICAL SPINE WITHOUT CONTRAST TECHNIQUE: Multidetector CT imaging of the  head, cervical spine, and maxillofacial structures were performed using the standard protocol without intravenous contrast. Multiplanar CT image reconstructions of the cervical spine and maxillofacial structures were also generated. COMPARISON:  None. FINDINGS: CT HEAD FINDINGS Brain: Patchy and confluent areas of decreased attenuation are noted throughout the deep and periventricular white matter of the cerebral hemispheres bilaterally, compatible with chronic microvascular ischemic disease. No evidence of large-territorial acute infarction. At least four distinct hyperdense foci within the left frontal lobe measuring 6 x 82mm, 6x4 mm, 2x2 mm, 2x27mm (4:18-21). Possible other punctate foci more superiorly (4:24). These appear to be located at the gray-white matter junction. No definite extra-axial collection. No definite pneumocephalus. No mass effect or midline shift. No hydrocephalus. Basilar cisterns are patent. Vascular: No hyperdense vessel. Atherosclerotic calcifications are present within the cavernous internal carotid and vertebral arteries. Skull: No acute fracture or focal lesion. Other: None. CT MAXILLOFACIAL FINDINGS Osseous: Markedly comminuted and 6 mm depressed left orbital floor fracture. Comminuted nasal process of bilateral maxilla fractures (6:88-92). This is noted to extend posteriorly on the left side to include a minimally displaced fracture of the anterior wall of the left maxillary sinus (6:77) as well as comminuted fracture of the medial wall of the left maxillary sinus (9:36). Left frontal sinus fracture (10:43) that is minimally displaced. Likely left ethmoid sinus fracture. No definite lamina papyracea fracture. No definite crista galli or cribriform plates bilaterally. Sinuses/Orbits: Air-fluid level  with hyperdense fluid within the left maxillary sinus. Air-fluid level within the left frontal sinus and ethmoid sinuses. Otherwise the remaining paranasal sinuses and mastoid air cells are clear. Bilateral lens replacement. Otherwise the orbits are unremarkable. Soft tissues: Soft tissue edema the left periorbital region with associated subcutaneus soft tissue emphysema and hematoma formation. Soft tissue edema and hematoma extends inferiorly to the left maxillary subcutaneus soft tissues. CT CERVICAL SPINE FINDINGS Alignment: Grade 1 anterolisthesis of C4 on C5, C6 on C7, C7 on T1. Otherwise normal. Skull base and vertebrae: Multilevel degenerative changes of the spine worse at the C5-C6 level. No acute fracture. No aggressive appearing focal osseous lesion or focal pathologic process. Soft tissues and spinal canal: No prevertebral fluid or swelling. No visible canal hematoma. Upper chest: Trace biapical pleural/pulmonary scarring. Other: None. IMPRESSION: 1. At least five distinct subcentimeter foci of intraparenchymal hemorrhage within the left frontal lobe. Underlying mass lesions not excluded. 2. No large territorial ischemic infarction identified. 3. Markedly comminuted and 6 mm depressed left orbital floor fracture. Inferior rectus muscle may be involved. Recommend clinical correlation for entrapment. 4. Minimally displaced anterior wall and comminuted medial wall fracture of the left maxillary sinus. Associated hemorrhage within the left maxillary sinus. 5. Minimally displaced fractures of the left frontal sinus. Possible left ethmoid sinus fracture. 6. Comminuted nasal process of the maxilla fractures bilaterally. 7. Left cribriform plate fracture not excluded. No pneumocephalus identified. 8. No acute displaced fracture or traumatic listhesis of the cervical spine. These results were called by telephone at the time of interpretation on 07/13/2020 at 4:14 pm to provider Dr. Rex Kras, who verbally acknowledged  these results. Electronically Signed   By: Iven Finn M.D.   On: 07/13/2020 16:26   DG Pelvis Portable  Result Date: 07/13/2020 CLINICAL DATA:  Pain following fall EXAM: PORTABLE PELVIS 1-2 VIEWS COMPARISON:  None. FINDINGS: Frontal pelvis obtained. There is screw and nail fixation in the proximal left femur with alignment at the previous fracture site essentially anatomic. Screw tip in proximal femoral head. There is no appreciable acute  fracture or dislocation. There is moderate symmetric narrowing of each hip joint. There is spurring in the pubic symphysis region. No erosion. There is extensive common femoral, superficial femoral, and profunda femoral artery calcification bilaterally. There is degenerative change in the lower lumbar spine. IMPRESSION: Postoperative change proximal left femur. No acute fracture or dislocation. Symmetric moderate narrowing of each hip joint. Degenerative change in lower lumbar spine. Multiple foci of arterial vascular calcification noted. Electronically Signed   By: Lowella Grip III M.D.   On: 07/13/2020 15:38   DG Hand 2 View Left  Result Date: 07/13/2020 CLINICAL DATA:  Pain following fall EXAM: LEFT HAND - 2 VIEW COMPARISON:  None. FINDINGS: Frontal and lateral views were obtained. No fracture or dislocation. There is osteoarthritic change in all PIP and DIP joints as well as in the first IP joint. No erosive change. There are multiple foci of arterial vascular calcification. IMPRESSION: No acute fracture or dislocation. 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 Foci of vascular calcification noted at several sites. Electronically Signed   By: Lowella Grip III M.D.   On: 07/13/2020 15:40   DG Chest Port 1 View  Result Date: 07/13/2020 CLINICAL DATA:  Pain following fall EXAM: PORTABLE CHEST 1 VIEW COMPARISON:  None FINDINGS: Lungs are clear. Heart is mildly enlarged with pacemaker leads attached to the right atrium and right ventricle. No  adenopathy. There is aortic atherosclerosis. No pneumothorax. No bone lesions. IMPRESSION: Cardiomegaly with pacemaker leads attached to right atrium and right ventricle. Lungs clear. No pneumothorax. Aortic Atherosclerosis (ICD10-I70.0). Electronically Signed   By: Lowella Grip III M.D.   On: 07/13/2020 15:39   CT MAXILLOFACIAL WO CONTRAST  Result Date: 07/13/2020 CLINICAL DATA:  Facial trauma, fall EXAM: CT HEAD WITHOUT CONTRAST CT MAXILLOFACIAL WITHOUT CONTRAST CT CERVICAL SPINE WITHOUT CONTRAST TECHNIQUE: Multidetector CT imaging of the head, cervical spine, and maxillofacial structures were performed using the standard protocol without intravenous contrast. Multiplanar CT image reconstructions of the cervical spine and maxillofacial structures were also generated. COMPARISON:  None. FINDINGS: CT HEAD FINDINGS Brain: Patchy and confluent areas of decreased attenuation are noted throughout the deep and periventricular white matter of the cerebral hemispheres bilaterally, compatible with chronic microvascular ischemic disease. No evidence of large-territorial acute infarction. At least four distinct hyperdense foci within the left frontal lobe measuring 6 x 58mm, 6x4 mm, 2x2 mm, 2x23mm (4:18-21). Possible other punctate foci more superiorly (4:24). These appear to be located at the gray-white matter junction. No definite extra-axial collection. No definite pneumocephalus. No mass effect or midline shift. No hydrocephalus. Basilar cisterns are patent. Vascular: No hyperdense vessel. Atherosclerotic calcifications are present within the cavernous internal carotid and vertebral arteries. Skull: No acute fracture or focal lesion. Other: None. CT MAXILLOFACIAL FINDINGS Osseous: Markedly comminuted and 6 mm depressed left orbital floor fracture. Comminuted nasal process of bilateral maxilla fractures (6:88-92). This is noted to extend posteriorly on the left side to include a minimally displaced fracture of the  anterior wall of the left maxillary sinus (6:77) as well as comminuted fracture of the medial wall of the left maxillary sinus (9:36). Left frontal sinus fracture (10:43) that is minimally displaced. Likely left ethmoid sinus fracture. No definite lamina papyracea fracture. No definite crista galli or cribriform plates bilaterally. Sinuses/Orbits: Air-fluid level with hyperdense fluid within the left maxillary sinus. Air-fluid level within the left frontal sinus and ethmoid sinuses. Otherwise the remaining paranasal sinuses and mastoid air cells are clear. Bilateral lens replacement. Otherwise the orbits are unremarkable.  Soft tissues: Soft tissue edema the left periorbital region with associated subcutaneus soft tissue emphysema and hematoma formation. Soft tissue edema and hematoma extends inferiorly to the left maxillary subcutaneus soft tissues. CT CERVICAL SPINE FINDINGS Alignment: Grade 1 anterolisthesis of C4 on C5, C6 on C7, C7 on T1. Otherwise normal. Skull base and vertebrae: Multilevel degenerative changes of the spine worse at the C5-C6 level. No acute fracture. No aggressive appearing focal osseous lesion or focal pathologic process. Soft tissues and spinal canal: No prevertebral fluid or swelling. No visible canal hematoma. Upper chest: Trace biapical pleural/pulmonary scarring. Other: None. IMPRESSION: 1. At least five distinct subcentimeter foci of intraparenchymal hemorrhage within the left frontal lobe. Underlying mass lesions not excluded. 2. No large territorial ischemic infarction identified. 3. Markedly comminuted and 6 mm depressed left orbital floor fracture. Inferior rectus muscle may be involved. Recommend clinical correlation for entrapment. 4. Minimally displaced anterior wall and comminuted medial wall fracture of the left maxillary sinus. Associated hemorrhage within the left maxillary sinus. 5. Minimally displaced fractures of the left frontal sinus. Possible left ethmoid sinus  fracture. 6. Comminuted nasal process of the maxilla fractures bilaterally. 7. Left cribriform plate fracture not excluded. No pneumocephalus identified. 8. No acute displaced fracture or traumatic listhesis of the cervical spine. These results were called by telephone at the time of interpretation on 07/13/2020 at 4:14 pm to provider Dr. Rex Kras, who verbally acknowledged these results. Electronically Signed   By: Iven Finn M.D.   On: 07/13/2020 16:26    Anti-infectives: Anti-infectives (From admission, onward)   None      Assessment/Plan: s/p * No surgery found *  Kara [name] is a 87 y.o. female  with h/o of Afib on Eliquis with pacemaker, HTN, HLD, T2DM, and GERD who presents as a Level 2 trauma after a mechanical ground-level fall while walking at facility. Advance diet  Waiting for [facility] and Maxillofacial to see pt for facial fxs and IPH Advance diet once they have made recommendations List of injuries: - Left frontal lobe intraparenchymal hemorrhage - L orbital floor fracture (?inferior rectus muscle involvement) - L maxillary sinus fracture - L frontal sinus fracture - Comminuted nasal process of the maxilla fractures bilaterally. - ?L esthmoid sinus fracture Will follow  LOS: 0 days    Autumn Messing III 07/14/2020

## 2020-07-14 NOTE — Evaluation (Signed)
Physical Therapy Evaluation Patient Details Name: Kara [name] MRN: [id number] DOB: 06-28-1922 Today's Date: 07/14/2020   History of Present Illness  Kara [name] is a 87 y.o. female  with h/o of Afib on Eliquis with pacemaker, HTN, HLD, T2DM, remote uterine cancer, L THA after hip fx, and GERD who presents as a Level 2 trauma after a mechanical ground-level fall while walking at facility. Pt with multiple facial fxs, intraparenchymal hemorrhage of the L frontal lobe, L hand pain (x-rays neg), and L hip pain (x-rays neg).  Clinical Impression  Pt admitted with above diagnosis. Pt reports she is starting to remember events leading up to her fall. She reports she was walking into the game room at her facility and saw an empty chair to sit in and then does not remember anything else. Needed increased time and use of rail to come to EOB. As pt transitioned sit>stand she realized that her L hip hurt worse than she realized. She also c/o L hand pain when holding RW. Pt tolerated 30' ambulation with RW and min A and needed min A for return to supine. Recommend short rehab stay at her facility before returning to her apt.  Pt currently with functional limitations due to the deficits listed below (see PT Problem List). Pt will benefit from skilled PT to increase their independence and safety with mobility to allow discharge to the venue listed below.       Follow Up Recommendations SNF;Supervision/Assistance - 24 hour    Equipment Recommendations  None recommended by PT    Recommendations for Other Services       Precautions / Restrictions Precautions Precautions: Fall Restrictions Weight Bearing Restrictions: No      Mobility  Bed Mobility Overal bed mobility: Needs Assistance Bed Mobility: Supine to Sit;Sit to Supine     Supine to sit: Min guard Sit to supine: Min assist   General bed mobility comments: increased time needed to come to EOB and use of rail. Min to LLE for return to  bed    Transfers Overall transfer level: Needs assistance Equipment used: Rolling walker (2 wheeled) Transfers: Sit to/from Stand Sit to Stand: Min assist         General transfer comment: min A to  power up due to L hip soreness  Ambulation/Gait Ambulation/Gait assistance: Min assist Gait Distance (Feet): 30 Feet Assistive device: Rolling walker (2 wheeled) Gait Pattern/deviations: Antalgic;Decreased weight shift to left;Step-to pattern;Decreased stance time - left Gait velocity: decreased Gait velocity interpretation: <1.31 ft/sec, indicative of household ambulator General Gait Details: Shoes donned before ambulation. pt with avoidance of full [place] on L due to L hip pain.  Stairs            Wheelchair Mobility    Modified Rankin (Stroke Patients Only) Modified Rankin (Stroke Patients Only) Pre-Morbid Rankin Score: No symptoms Modified Rankin: Moderately severe disability     Balance Overall balance assessment: Needs assistance;History of Falls Sitting-balance support: Single extremity supported;Feet supported Sitting balance-Leahy Scale: Fair     Standing balance support: Bilateral upper extremity supported Standing balance-Leahy Scale: Poor Standing balance comment: reliant on UE support                             Pertinent Vitals/Pain Pain Assessment: Faces Faces Pain Scale: Hurts even more Pain Location: L hip, L hand, face Pain Descriptors / Indicators: Aching;Sore Pain Intervention(s): Limited activity within patient's tolerance;Monitored during session  Home Living Family/patient expects to be discharged to:: Private residence Living Arrangements: Alone Available Help at Discharge: Personal care attendant;Available PRN/intermittently Type of Home: Independent living facility Home Access: Level entry     Home Layout: One level Home Equipment: Cane - single point;Walker - 4 wheels Additional Comments: pt lives in independent  living and has caregiver that comes 1x/wk. Per her report, facility does have multilevel care available    Prior Function Level of Independence: Independent               Hand Dominance        Extremity/Trunk Assessment   Upper Extremity Assessment Upper Extremity Assessment: Defer to OT evaluation    Lower Extremity Assessment Lower Extremity Assessment: LLE deficits/detail LLE Deficits / Details: L hip painful in [place], strength >3/5 LLE: Unable to fully assess due to pain LLE Sensation: history of peripheral neuropathy LLE Coordination: decreased gross motor    Cervical / Trunk Assessment Cervical / Trunk Assessment: Kyphotic  Communication   Communication: No difficulties  Cognition Arousal/Alertness: Awake/alert Behavior During Therapy: WFL for tasks assessed/performed Overall Cognitive Status: Within Functional Limits for tasks assessed                                        General Comments General comments (skin integrity, edema, etc.): attempted to take standing BP but pt could not let go of RW for long enough with R hand and reading was inaccurate. Pt denied dizziness    Exercises     Assessment/Plan    PT Assessment Patient needs continued PT services  PT Problem List Decreased strength;Decreased activity tolerance;Decreased balance;Decreased mobility;Pain;Impaired sensation;Decreased knowledge of precautions       PT Treatment Interventions DME instruction;Gait training;Functional mobility training;Therapeutic activities;Therapeutic exercise;Balance training;Patient/family education    PT Goals (Current goals can be found in the Care Plan section)  Acute Rehab PT Goals Patient Stated Goal: return to apt PT Goal Formulation: With patient Time For Goal Achievement: 07/28/20 Potential to Achieve Goals: Good    Frequency Min 4X/week   Barriers to discharge Decreased caregiver support independent living    Co-evaluation                AM-PAC PT "6 Clicks" Mobility  Outcome Measure Help needed turning from your back to your side while in a flat bed without using bedrails?: A Little Help needed moving from lying on your back to sitting on the side of a flat bed without using bedrails?: A Little Help needed moving to and from a bed to a chair (including a wheelchair)?: A Little Help needed standing up from a chair using your arms (e.g., wheelchair or bedside chair)?: A Little Help needed to walk in hospital room?: A Little Help needed climbing 3-5 steps with a railing? : A Lot 6 Click Score: 17    End of Session Equipment Utilized During Treatment: Gait belt Activity Tolerance: Patient limited by pain Patient left: in bed;with call bell/phone within reach;with bed alarm set Nurse Communication: Mobility status PT Visit Diagnosis: History of falling (Z91.81);Pain;Difficulty in walking, not elsewhere classified (R26.2);Other abnormalities of gait and mobility (R26.89);Unsteadiness on feet (R26.81) Pain - Right/Left: Left Pain - part of body: Hip;Hand    Time: 8101-7510 PT Time Calculation (min) (ACUTE ONLY): 27 min   Charges:   PT Evaluation $PT Eval Moderate Complexity: 1 Mod PT Treatments $Gait Training: 8-22 mins  Leighton Roach, [place]  Pager [phone] Office [place] 07/14/2020, 3:16 PM

## 2020-07-14 NOTE — Consult Note (Signed)
Reason for Consult: Closed head injury with orbital fractures Referring Physician:.  Dr. Ronda Fairly MULKI ROESLER is an 87 y.o. female.  HPI: Patient is a 87 year old individual who had a fall from bed height yesterday.  She struck the left side of her head sustaining significant periorbital ecchymoses.  There is no documented loss of consciousness.  A CT scan performed yesterday demonstrated some small amounts of traumatic subarachnoid hemorrhage on the left side.  I advised a follow-up scan in approximately 12 hours and simple observation at that time.  The patient is seen today now about 24 hours from the time of the initial injury.  Follow-up CT scan demonstrates the presence of some evolution with small subdural collections bilaterally without significant mass-effect.  The subarachnoid blood in the left frontal region remains the same.  Orbital fractures are again noted.  History reviewed. No pertinent past medical history.  History reviewed. No pertinent surgical history.  History reviewed. No pertinent family history.  Social History:  has no history on file for tobacco use, alcohol use, and drug use.  Allergies: No Known Allergies  Medications: I have reviewed the patient's current medications.  Results for orders placed or performed during the hospital encounter of 07/13/20 (from the past 48 hour(s))  Resp Panel by RT-PCR (Flu A&B, Covid) Nasopharyngeal Swab     Status: None   Collection Time: 07/13/20  2:54 PM   Specimen: Nasopharyngeal Swab; Nasopharyngeal(NP) swabs in vial transport medium  Result Value Ref Range   SARS Coronavirus 2 by RT PCR NEGATIVE NEGATIVE    Comment: (NOTE) SARS-CoV-2 target nucleic acids are NOT DETECTED.  The SARS-CoV-2 RNA is generally detectable in upper respiratory specimens during the acute phase of infection. The lowest concentration of SARS-CoV-2 viral copies this assay can detect is 138 copies/mL. A negative result does not preclude  SARS-Cov-2 infection and should not be used as the sole basis for treatment or other patient management decisions. A negative result may occur with  improper specimen collection/handling, submission of specimen other than nasopharyngeal swab, presence of viral mutation(s) within the areas targeted by this assay, and inadequate number of viral copies(<138 copies/mL). A negative result must be combined with clinical observations, patient history, and epidemiological information. The expected result is Negative.  Fact Sheet for Patients:  EntrepreneurPulse.com.au  Fact Sheet for Healthcare Providers:  IncredibleEmployment.be  This test is no t yet approved or cleared by the [place] FDA and  has been authorized for detection and/or diagnosis of SARS-CoV-2 by FDA under an Emergency Use Authorization (EUA). This EUA will remain  in effect (meaning this test can be used) for the duration of the COVID-19 declaration under Section 564(b)(1) of the Act, 21 U.S.C.section 360bbb-3(b)(1), unless the authorization is terminated  or revoked sooner.       Influenza A by PCR NEGATIVE NEGATIVE   Influenza B by PCR NEGATIVE NEGATIVE    Comment: (NOTE) The Xpert Xpress SARS-CoV-2/FLU/RSV plus assay is intended as an aid in the diagnosis of influenza from Nasopharyngeal swab specimens and should not be used as a sole basis for treatment. Nasal washings and aspirates are unacceptable for Xpert Xpress SARS-CoV-2/FLU/RSV testing.  Fact Sheet for Patients: EntrepreneurPulse.com.au  Fact Sheet for Healthcare Providers: IncredibleEmployment.be  This test is not yet approved or cleared by the [place] FDA and has been authorized for detection and/or diagnosis of SARS-CoV-2 by FDA under an Emergency Use Authorization (EUA). This EUA will remain in effect (meaning this test can be used)  for the duration of the COVID-19  declaration under Section 564(b)(1) of the Act, 21 U.S.C. section 360bbb-3(b)(1), unless the authorization is terminated or revoked.  Performed at [facility], [place] [address] [place], [place] [address]   Comprehensive metabolic panel     Status: Abnormal   Collection Time: 07/13/20  2:54 PM  Result Value Ref Range   Sodium 141 135 - 145 mmol/L   Potassium 3.0 (L) 3.5 - 5.1 mmol/L   Chloride 104 98 - 111 mmol/L   CO2 24 22 - 32 mmol/L   Glucose, Bld 236 (H) 70 - 99 mg/dL    Comment: Glucose reference range applies only to samples taken after fasting for at least 8 hours.   BUN 22 8 - 23 mg/dL   Creatinine, Ser 0.96 0.44 - 1.00 mg/dL   Calcium 8.7 (L) 8.9 - 10.3 mg/dL   Total Protein 6.5 6.5 - 8.1 g/dL   Albumin 3.6 3.5 - 5.0 g/dL   AST 24 15 - 41 U/L   ALT 19 0 - 44 U/L   Alkaline Phosphatase 50 38 - 126 U/L   Total Bilirubin 1.7 (H) 0.3 - 1.2 mg/dL   GFR, Estimated 54 (L) >60 mL/min    Comment: (NOTE) Calculated using the CKD-EPI Creatinine Equation (2021)    Anion gap 13 5 - 15    Comment: Performed at [facility] [address] [place], [place] [address]  CBC     Status: None   Collection Time: 07/13/20  2:54 PM  Result Value Ref Range   WBC 7.5 4.0 - 10.5 K/uL   RBC 4.51 3.87 - 5.11 MIL/uL   Hemoglobin 14.2 12.0 - 15.0 g/dL   HCT 43.0 36.0 - 46.0 %   MCV 95.3 80.0 - 100.0 fL   MCH 31.5 26.0 - 34.0 pg   MCHC 33.0 30.0 - 36.0 g/dL   RDW 13.1 11.5 - 15.5 %   Platelets 152 150 - 400 K/uL   nRBC 0.0 0.0 - 0.2 %    Comment: Performed at [facility], [place] [address] [place], [place] [address]  Lactic acid, plasma     Status: Abnormal   Collection Time: 07/13/20  2:54 PM  Result Value Ref Range   Lactic Acid, Venous 3.7 (HH) 0.5 - 1.9 mmol/L    Comment: CRITICAL RESULT CALLED TO, READ BACK BY AND VERIFIED WITH:  [name] @[id number]  07/13/20 K. SANDERS  Performed at [facility], [place] [address] [place], [place] [address]   Protime-INR      Status: None   Collection Time: 07/13/20  2:54 PM  Result Value Ref Range   Prothrombin Time 13.9 11.4 - 15.2 seconds   INR 1.1 0.8 - 1.2    Comment: (NOTE) INR goal varies based on device and disease states. Performed at [facility], [place] [address], [place], [place] [address]   Sample to Blood Bank     Status: None   Collection Time: 07/13/20  3:03 PM  Result Value Ref Range   Blood Bank Specimen SAMPLE AVAILABLE FOR TESTING    Sample Expiration      07/14/2020,2359 Performed at [facility], [place] [address], [place], [place] [address]   I-Stat Chem 8, ED     Status: Abnormal   Collection Time: 07/13/20  3:09 PM  Result Value Ref Range   Sodium 142 135 - 145 mmol/L   Potassium 2.9 (L) 3.5 -  5.1 mmol/L   Chloride 103 98 - 111 mmol/L   BUN 24 (H) 8 - 23 mg/dL   Creatinine, Ser 0.80 0.44 - 1.00 mg/dL   Glucose, Bld 227 (H) 70 - 99 mg/dL    Comment: Glucose reference range applies only to samples taken after fasting for at least 8 hours.   Calcium, Ion 1.11 (L) 1.15 - 1.40 mmol/L   TCO2 24 22 - 32 mmol/L   Hemoglobin 15.0 12.0 - 15.0 g/dL   HCT 44.0 36.0 - 46.0 %  Magnesium     Status: None   Collection Time: 07/13/20  3:13 PM  Result Value Ref Range   Magnesium 1.8 1.7 - 2.4 mg/dL    Comment: Performed at [facility] [address] [place], [place] [address]  Urinalysis, Routine w reflex microscopic     Status: Abnormal   Collection Time: 07/13/20  4:47 PM  Result Value Ref Range   Color, Urine STRAW (A) YELLOW   APPearance CLEAR CLEAR   Specific Gravity, Urine 1.006 1.005 - 1.030   pH 7.0 5.0 - 8.0   Glucose, UA NEGATIVE NEGATIVE mg/dL   Hgb urine dipstick SMALL (A) NEGATIVE   Bilirubin Urine NEGATIVE NEGATIVE   Ketones, ur NEGATIVE NEGATIVE mg/dL   Protein, ur NEGATIVE NEGATIVE mg/dL   Nitrite NEGATIVE NEGATIVE   Leukocytes,Ua NEGATIVE NEGATIVE   RBC / HPF 0-5 0 - 5 RBC/hpf   WBC, UA 0-5 0 - 5 WBC/hpf   Bacteria, UA NONE SEEN NONE SEEN    Squamous Epithelial / LPF 0-5 0 - 5    Comment: Performed at [facility] [address], [place], [place] [address]  Lactic acid, plasma     Status: Abnormal   Collection Time: 07/13/20  6:47 PM  Result Value Ref Range   Lactic Acid, Venous 4.1 (HH) 0.5 - 1.9 mmol/L    Comment: CRITICAL VALUE NOTED.  VALUE IS CONSISTENT WITH PREVIOUSLY REPORTED AND CALLED VALUE. Performed at [facility], [place] [address] [place], [place] [address]   CBG monitoring, ED     Status: Abnormal   Collection Time: 07/13/20  8:44 PM  Result Value Ref Range   Glucose-Capillary 142 (H) 70 - 99 mg/dL    Comment: Glucose reference range applies only to samples taken after fasting for at least 8 hours.  Glucose, capillary     Status: Abnormal   Collection Time: 07/13/20  9:13 PM  Result Value Ref Range   Glucose-Capillary 151 (H) 70 - 99 mg/dL    Comment: Glucose reference range applies only to samples taken after fasting for at least 8 hours.   Comment 1 Notify RN    Comment 2 Document in Chart   CBC     Status: Abnormal   Collection Time: 07/14/20  3:17 AM  Result Value Ref Range   WBC 8.6 4.0 - 10.5 K/uL   RBC 3.81 (L) 3.87 - 5.11 MIL/uL   Hemoglobin 12.2 12.0 - 15.0 g/dL   HCT 35.1 (L) 36.0 - 46.0 %   MCV 92.1 80.0 - 100.0 fL   MCH 32.0 26.0 - 34.0 pg   MCHC 34.8 30.0 - 36.0 g/dL   RDW 13.1 11.5 - 15.5 %   Platelets 138 (L) 150 - 400 K/uL   nRBC 0.0 0.0 - 0.2 %    Comment: Performed at [facility], [place] [address] [place],  [address]  Basic metabolic panel  Status: Abnormal   Collection Time: 07/14/20  3:17 AM  Result Value Ref Range   Sodium 137 135 - 145 mmol/L   Potassium 3.0 (L) 3.5 - 5.1 mmol/L   Chloride 101 98 - 111 mmol/L   CO2 26 22 - 32 mmol/L   Glucose, Bld 129 (H) 70 - 99 mg/dL    Comment: Glucose reference range applies only to samples taken after fasting for at least 8 hours.   BUN 17 8 - 23 mg/dL   Creatinine, Ser 0.86 0.44 - 1.00 mg/dL   Calcium 8.2  (L) 8.9 - 10.3 mg/dL   GFR, Estimated >60 >60 mL/min    Comment: (NOTE) Calculated using the CKD-EPI Creatinine Equation (2021)    Anion gap 10 5 - 15    Comment: Performed at [facility] [address], [place], [place] [address]  Hemoglobin A1c     Status: Abnormal   Collection Time: 07/14/20  3:17 AM  Result Value Ref Range   Hgb A1c MFr Bld 6.3 (H) 4.8 - 5.6 %    Comment: (NOTE) Pre diabetes:          5.7%-6.4%  Diabetes:              >6.4%  Glycemic control for   <7.0% adults with diabetes    Mean Plasma Glucose 134.11 mg/dL    Comment: Performed at [facility] [address], [place], [place] [address]  Glucose, capillary     Status: Abnormal   Collection Time: 07/14/20  6:03 AM  Result Value Ref Range   Glucose-Capillary 113 (H) 70 - 99 mg/dL    Comment: Glucose reference range applies only to samples taken after fasting for at least 8 hours.   Comment 1 Notify RN    Comment 2 Document in Chart   Lactic acid, plasma     Status: Abnormal   Collection Time: 07/14/20  9:01 AM  Result Value Ref Range   Lactic Acid, Venous 2.5 (HH) 0.5 - 1.9 mmol/L    Comment: CRITICAL VALUE NOTED.  VALUE IS CONSISTENT WITH PREVIOUSLY REPORTED AND CALLED VALUE. Performed at [facility], [place] [address] [place], [place] [address]   Glucose, capillary     Status: Abnormal   Collection Time: 07/14/20 12:46 PM  Result Value Ref Range   Glucose-Capillary 177 (H) 70 - 99 mg/dL    Comment: Glucose reference range applies only to samples taken after fasting for at least 8 hours.    CT HEAD WO CONTRAST  Result Date: 07/14/2020 CLINICAL DATA:  87 year old female status post fall with posttraumatic shear hemorrhages or hemorrhagic contusions. EXAM: CT HEAD WITHOUT CONTRAST TECHNIQUE: Contiguous axial images were obtained from the base of the skull through the vertex without intravenous contrast. COMPARISON:  Head CT 07/13/2020. FINDINGS: Brain: There are trace bilateral  subdural hematomas or less likely posttraumatic subdural hygromas (coronal image 24 and series 3, image 17. These are more apparent since yesterday. Minimal leftward midline shift is stable. Basilar cisterns remain normal. Several small white matter and gray-white matter junction parenchymal hemorrhages in the anterior left frontal lobe are stable since yesterday. Possible small surface hemorrhagic contusion or trace subarachnoid hemorrhage on series 3, image 20. No significant left frontal lobe edema or mass effect. No new parenchymal hemorrhage. Stable gray-white matter differentiation elsewhere. Trace subarachnoid hemorrhages layering in both sylvian fissures. No intraventricular hemorrhage identified. No ventriculomegaly. No cortically based acute infarct identified. Vascular: Extensive Calcified atherosclerosis at  the skull base. Skull: Left orbital floor and nondisplaced posterior left maxillary sinus fractures are visible. No calvarium fracture identified. Sinuses/Orbits: Stable hemorrhage in the left maxillary sinus. Stable small volume hemorrhage layering in the left frontal sinus. Mild left ethmoid fluid is stable. Right paranasal sinuses, tympanic cavities and mastoids remain clear. Other: Left face and scalp contusion/hematoma. Globes and intraorbital soft tissues remain normal. IMPRESSION: 1. There are trace bilateral subdural hematomas, more apparent than yesterday. Minimal associated leftward midline shift is stable. 2. Several small anterior left frontal lobe shear hemorrhages or hemorrhagic contusions are stable since yesterday. Associated trace subarachnoid hemorrhage not significantly changed. 3. No new intracranial abnormality. 4. Left orbit and maxilla fractures with hemorrhage in the left paranasal sinuses again noted. No calvarium fracture identified. Electronically Signed   By: Genevie Ann M.D.   On: 07/14/2020 07:32   CT HEAD WO CONTRAST  Result Date: 07/13/2020 CLINICAL DATA:  Facial  trauma, fall EXAM: CT HEAD WITHOUT CONTRAST CT MAXILLOFACIAL WITHOUT CONTRAST CT CERVICAL SPINE WITHOUT CONTRAST TECHNIQUE: Multidetector CT imaging of the head, cervical spine, and maxillofacial structures were performed using the standard protocol without intravenous contrast. Multiplanar CT image reconstructions of the cervical spine and maxillofacial structures were also generated. COMPARISON:  None. FINDINGS: CT HEAD FINDINGS Brain: Patchy and confluent areas of decreased attenuation are noted throughout the deep and periventricular white matter of the cerebral hemispheres bilaterally, compatible with chronic microvascular ischemic disease. No evidence of large-territorial acute infarction. At least four distinct hyperdense foci within the left frontal lobe measuring 6 x 36mm, 6x4 mm, 2x2 mm, 2x74mm (4:18-21). Possible other punctate foci more superiorly (4:24). These appear to be located at the gray-white matter junction. No definite extra-axial collection. No definite pneumocephalus. No mass effect or midline shift. No hydrocephalus. Basilar cisterns are patent. Vascular: No hyperdense vessel. Atherosclerotic calcifications are present within the cavernous internal carotid and vertebral arteries. Skull: No acute fracture or focal lesion. Other: None. CT MAXILLOFACIAL FINDINGS Osseous: Markedly comminuted and 6 mm depressed left orbital floor fracture. Comminuted nasal process of bilateral maxilla fractures (6:88-92). This is noted to extend posteriorly on the left side to include a minimally displaced fracture of the anterior wall of the left maxillary sinus (6:77) as well as comminuted fracture of the medial wall of the left maxillary sinus (9:36). Left frontal sinus fracture (10:43) that is minimally displaced. Likely left ethmoid sinus fracture. No definite lamina papyracea fracture. No definite crista galli or cribriform plates bilaterally. Sinuses/Orbits: Air-fluid level with hyperdense fluid within the  left maxillary sinus. Air-fluid level within the left frontal sinus and ethmoid sinuses. Otherwise the remaining paranasal sinuses and mastoid air cells are clear. Bilateral lens replacement. Otherwise the orbits are unremarkable. Soft tissues: Soft tissue edema the left periorbital region with associated subcutaneus soft tissue emphysema and hematoma formation. Soft tissue edema and hematoma extends inferiorly to the left maxillary subcutaneus soft tissues. CT CERVICAL SPINE FINDINGS Alignment: Grade 1 anterolisthesis of C4 on C5, C6 on C7, C7 on T1. Otherwise normal. Skull base and vertebrae: Multilevel degenerative changes of the spine worse at the C5-C6 level. No acute fracture. No aggressive appearing focal osseous lesion or focal pathologic process. Soft tissues and spinal canal: No prevertebral fluid or swelling. No visible canal hematoma. Upper chest: Trace biapical pleural/pulmonary scarring. Other: None. IMPRESSION: 1. At least five distinct subcentimeter foci of intraparenchymal hemorrhage within the left frontal lobe. Underlying mass lesions not excluded. 2. No large territorial ischemic infarction identified. 3. Markedly comminuted and  6 mm depressed left orbital floor fracture. Inferior rectus muscle may be involved. Recommend clinical correlation for entrapment. 4. Minimally displaced anterior wall and comminuted medial wall fracture of the left maxillary sinus. Associated hemorrhage within the left maxillary sinus. 5. Minimally displaced fractures of the left frontal sinus. Possible left ethmoid sinus fracture. 6. Comminuted nasal process of the maxilla fractures bilaterally. 7. Left cribriform plate fracture not excluded. No pneumocephalus identified. 8. No acute displaced fracture or traumatic listhesis of the cervical spine. These results were called by telephone at the time of interpretation on 07/13/2020 at 4:14 pm to provider Dr. Rex Kras, who verbally acknowledged these results. Electronically  Signed   By: Iven Finn M.D.   On: 07/13/2020 16:26   CT CERVICAL SPINE WO CONTRAST  Result Date: 07/13/2020 CLINICAL DATA:  Facial trauma, fall EXAM: CT HEAD WITHOUT CONTRAST CT MAXILLOFACIAL WITHOUT CONTRAST CT CERVICAL SPINE WITHOUT CONTRAST TECHNIQUE: Multidetector CT imaging of the head, cervical spine, and maxillofacial structures were performed using the standard protocol without intravenous contrast. Multiplanar CT image reconstructions of the cervical spine and maxillofacial structures were also generated. COMPARISON:  None. FINDINGS: CT HEAD FINDINGS Brain: Patchy and confluent areas of decreased attenuation are noted throughout the deep and periventricular white matter of the cerebral hemispheres bilaterally, compatible with chronic microvascular ischemic disease. No evidence of large-territorial acute infarction. At least four distinct hyperdense foci within the left frontal lobe measuring 6 x 50mm, 6x4 mm, 2x2 mm, 2x77mm (4:18-21). Possible other punctate foci more superiorly (4:24). These appear to be located at the gray-white matter junction. No definite extra-axial collection. No definite pneumocephalus. No mass effect or midline shift. No hydrocephalus. Basilar cisterns are patent. Vascular: No hyperdense vessel. Atherosclerotic calcifications are present within the cavernous internal carotid and vertebral arteries. Skull: No acute fracture or focal lesion. Other: None. CT MAXILLOFACIAL FINDINGS Osseous: Markedly comminuted and 6 mm depressed left orbital floor fracture. Comminuted nasal process of bilateral maxilla fractures (6:88-92). This is noted to extend posteriorly on the left side to include a minimally displaced fracture of the anterior wall of the left maxillary sinus (6:77) as well as comminuted fracture of the medial wall of the left maxillary sinus (9:36). Left frontal sinus fracture (10:43) that is minimally displaced. Likely left ethmoid sinus fracture. No definite lamina  papyracea fracture. No definite crista galli or cribriform plates bilaterally. Sinuses/Orbits: Air-fluid level with hyperdense fluid within the left maxillary sinus. Air-fluid level within the left frontal sinus and ethmoid sinuses. Otherwise the remaining paranasal sinuses and mastoid air cells are clear. Bilateral lens replacement. Otherwise the orbits are unremarkable. Soft tissues: Soft tissue edema the left periorbital region with associated subcutaneus soft tissue emphysema and hematoma formation. Soft tissue edema and hematoma extends inferiorly to the left maxillary subcutaneus soft tissues. CT CERVICAL SPINE FINDINGS Alignment: Grade 1 anterolisthesis of C4 on C5, C6 on C7, C7 on T1. Otherwise normal. Skull base and vertebrae: Multilevel degenerative changes of the spine worse at the C5-C6 level. No acute fracture. No aggressive appearing focal osseous lesion or focal pathologic process. Soft tissues and spinal canal: No prevertebral fluid or swelling. No visible canal hematoma. Upper chest: Trace biapical pleural/pulmonary scarring. Other: None. IMPRESSION: 1. At least five distinct subcentimeter foci of intraparenchymal hemorrhage within the left frontal lobe. Underlying mass lesions not excluded. 2. No large territorial ischemic infarction identified. 3. Markedly comminuted and 6 mm depressed left orbital floor fracture. Inferior rectus muscle may be involved. Recommend clinical correlation for entrapment. 4. Minimally displaced  anterior wall and comminuted medial wall fracture of the left maxillary sinus. Associated hemorrhage within the left maxillary sinus. 5. Minimally displaced fractures of the left frontal sinus. Possible left ethmoid sinus fracture. 6. Comminuted nasal process of the maxilla fractures bilaterally. 7. Left cribriform plate fracture not excluded. No pneumocephalus identified. 8. No acute displaced fracture or traumatic listhesis of the cervical spine. These results were called by  telephone at the time of interpretation on 07/13/2020 at 4:14 pm to provider Dr. Rex Kras, who verbally acknowledged these results. Electronically Signed   By: Iven Finn M.D.   On: 07/13/2020 16:26   DG Pelvis Portable  Result Date: 07/13/2020 CLINICAL DATA:  Pain following fall EXAM: PORTABLE PELVIS 1-2 VIEWS COMPARISON:  None. FINDINGS: Frontal pelvis obtained. There is screw and nail fixation in the proximal left femur with alignment at the previous fracture site essentially anatomic. Screw tip in proximal femoral head. There is no appreciable acute fracture or dislocation. There is moderate symmetric narrowing of each hip joint. There is spurring in the pubic symphysis region. No erosion. There is extensive common femoral, superficial femoral, and profunda femoral artery calcification bilaterally. There is degenerative change in the lower lumbar spine. IMPRESSION: Postoperative change proximal left femur. No acute fracture or dislocation. Symmetric moderate narrowing of each hip joint. Degenerative change in lower lumbar spine. Multiple foci of arterial vascular calcification noted. Electronically Signed   By: Lowella Grip III M.D.   On: 07/13/2020 15:38   DG Hand 2 View Left  Result Date: 07/13/2020 CLINICAL DATA:  Pain following fall EXAM: LEFT HAND - 2 VIEW COMPARISON:  None. FINDINGS: Frontal and lateral views were obtained. No fracture or dislocation. There is osteoarthritic change in all PIP and DIP joints as well as in the first IP joint. No erosive change. There are multiple foci of arterial vascular calcification. IMPRESSION: No acute fracture or dislocation. 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 Foci of vascular calcification noted at several sites. Electronically Signed   By: Lowella Grip III M.D.   On: 07/13/2020 15:40   DG Chest Port 1 View  Result Date: 07/13/2020 CLINICAL DATA:  Pain following fall EXAM: PORTABLE CHEST 1 VIEW COMPARISON:  None FINDINGS: Lungs  are clear. Heart is mildly enlarged with pacemaker leads attached to the right atrium and right ventricle. No adenopathy. There is aortic atherosclerosis. No pneumothorax. No bone lesions. IMPRESSION: Cardiomegaly with pacemaker leads attached to right atrium and right ventricle. Lungs clear. No pneumothorax. Aortic Atherosclerosis (ICD10-I70.0). Electronically Signed   By: Lowella Grip III M.D.   On: 07/13/2020 15:39   CT MAXILLOFACIAL WO CONTRAST  Result Date: 07/13/2020 CLINICAL DATA:  Facial trauma, fall EXAM: CT HEAD WITHOUT CONTRAST CT MAXILLOFACIAL WITHOUT CONTRAST CT CERVICAL SPINE WITHOUT CONTRAST TECHNIQUE: Multidetector CT imaging of the head, cervical spine, and maxillofacial structures were performed using the standard protocol without intravenous contrast. Multiplanar CT image reconstructions of the cervical spine and maxillofacial structures were also generated. COMPARISON:  None. FINDINGS: CT HEAD FINDINGS Brain: Patchy and confluent areas of decreased attenuation are noted throughout the deep and periventricular white matter of the cerebral hemispheres bilaterally, compatible with chronic microvascular ischemic disease. No evidence of large-territorial acute infarction. At least four distinct hyperdense foci within the left frontal lobe measuring 6 x 48mm, 6x4 mm, 2x2 mm, 2x68mm (4:18-21). Possible other punctate foci more superiorly (4:24). These appear to be located at the gray-white matter junction. No definite extra-axial collection. No definite pneumocephalus. No mass effect or midline shift.  No hydrocephalus. Basilar cisterns are patent. Vascular: No hyperdense vessel. Atherosclerotic calcifications are present within the cavernous internal carotid and vertebral arteries. Skull: No acute fracture or focal lesion. Other: None. CT MAXILLOFACIAL FINDINGS Osseous: Markedly comminuted and 6 mm depressed left orbital floor fracture. Comminuted nasal process of bilateral maxilla fractures  (6:88-92). This is noted to extend posteriorly on the left side to include a minimally displaced fracture of the anterior wall of the left maxillary sinus (6:77) as well as comminuted fracture of the medial wall of the left maxillary sinus (9:36). Left frontal sinus fracture (10:43) that is minimally displaced. Likely left ethmoid sinus fracture. No definite lamina papyracea fracture. No definite crista galli or cribriform plates bilaterally. Sinuses/Orbits: Air-fluid level with hyperdense fluid within the left maxillary sinus. Air-fluid level within the left frontal sinus and ethmoid sinuses. Otherwise the remaining paranasal sinuses and mastoid air cells are clear. Bilateral lens replacement. Otherwise the orbits are unremarkable. Soft tissues: Soft tissue edema the left periorbital region with associated subcutaneus soft tissue emphysema and hematoma formation. Soft tissue edema and hematoma extends inferiorly to the left maxillary subcutaneus soft tissues. CT CERVICAL SPINE FINDINGS Alignment: Grade 1 anterolisthesis of C4 on C5, C6 on C7, C7 on T1. Otherwise normal. Skull base and vertebrae: Multilevel degenerative changes of the spine worse at the C5-C6 level. No acute fracture. No aggressive appearing focal osseous lesion or focal pathologic process. Soft tissues and spinal canal: No prevertebral fluid or swelling. No visible canal hematoma. Upper chest: Trace biapical pleural/pulmonary scarring. Other: None. IMPRESSION: 1. At least five distinct subcentimeter foci of intraparenchymal hemorrhage within the left frontal lobe. Underlying mass lesions not excluded. 2. No large territorial ischemic infarction identified. 3. Markedly comminuted and 6 mm depressed left orbital floor fracture. Inferior rectus muscle may be involved. Recommend clinical correlation for entrapment. 4. Minimally displaced anterior wall and comminuted medial wall fracture of the left maxillary sinus. Associated hemorrhage within the left  maxillary sinus. 5. Minimally displaced fractures of the left frontal sinus. Possible left ethmoid sinus fracture. 6. Comminuted nasal process of the maxilla fractures bilaterally. 7. Left cribriform plate fracture not excluded. No pneumocephalus identified. 8. No acute displaced fracture or traumatic listhesis of the cervical spine. These results were called by telephone at the time of interpretation on 07/13/2020 at 4:14 pm to provider Dr. Rex Kras, who verbally acknowledged these results. Electronically Signed   By: Iven Finn M.D.   On: 07/13/2020 16:26    Review of Systems  Unable to perform ROS: Acuity of condition   Blood pressure (!) 130/56, pulse 83, temperature 97.9 F (36.6 C), temperature source Oral, resp. rate 20, height 5\' 2"  (1.575 m), weight 56.1 kg, SpO2 93 %. Physical Exam Constitutional:      Appearance: Normal appearance.  HENT:     Head: Normocephalic.     Comments: Left periorbital ecchymoses and abrasions to the scalp.    Right Ear: Tympanic membrane normal.     Left Ear: Tympanic membrane normal.     Nose: Nose normal.     Mouth/Throat:     Mouth: Mucous membranes are moist.  Eyes:     Pupils: Pupils are equal, round, and reactive to light.  Cardiovascular:     Rate and Rhythm: Normal rate.  Pulmonary:     Effort: Pulmonary effort is normal.     Breath sounds: Normal breath sounds.  Abdominal:     General: Abdomen is flat.     Palpations: Abdomen is soft.  Musculoskeletal:     Cervical back: Normal range of motion.  Skin:    General: Skin is warm and dry.  Neurological:     Mental Status: She is alert.     Comments: Patient is awake alert and moves all extremities well.  She denies any overt pain and appears comfortable.  Cranial nerves reveal pupils are 3 mm and reactive extraocular movements are full and the face is symmetric.  Tongue and uvula protrude in the midline.  Motor function is intact in all 4 extremities.     Assessment/Plan: Status post  closed head injury with orbital fractures and traumatic subarachnoid blood in addition to evolution of small subdural collections.  Plan: Simple observation.  I do not believe another scan is necessary unless neurologic status deteriorates or changes substantially.  Blanchie Dessert Jennings Stirling 07/14/2020, 2:30 PM

## 2020-07-14 NOTE — Progress Notes (Addendum)
PROGRESS NOTE  Kara [name] ASN:[id number] DOB: 1923/04/09 DOA: 07/13/2020 PCP: Pcp, No   LOS: 0 days   Brief narrative:  Patient is a 87 years old female with past medical history of sick sinus syndrome status post pacemaker, atrial fibrillation on Eliquis, hypertension, diabetes mellitus, hyperlipidemia and history of uterine cancer status post radiation in the past presented to hospital with unwitnessed fall/syncope while using her Rollator.  Patient did not remember the event.  Patient arrived as a trauma level 2 with GCS score 15 and hemodynamically stable.  No hypoglycemia.  CT head and CT Maxillofacial revealed at least 5 distinct subcentimeter foci of intraparenchymal hemorrhage in the left frontal lobe.  There is a comminuted and depressed left orbital floor.  Minimally displaced anterior wall and comminuted medial wall fracture of the left maxillary sinus.  Associated hemorrhage within the left maxillary sinus.  Minimally displaced fracture of the left frontal sinus and possible left ethmoid sinus fracture.  Comminuted nasal fracture maxilla fracture bilaterally.  Patient's last dose of Eliquis was evening of 2/24.  She was given K-centra in the ED for reversal and15 mg of labetalol in the ED for hypertension. Her pacemaker was interrogated and reportedly showed no Arrhythmia. Trauma surgery, neurosurgery and facial trauma surgeon were consulted by ED physician who recommended admission for monitoring and no further imaging recommendation at this time.   Assessment/Plan:  Principal Problem:   Intraparenchymal hemorrhage of brain (HCC) Active Problems:   Syncope and collapse   Facial fracture due to fall (HCC)   Orbital fracture (HCC)   Hypokalemia   Atrial fibrillation, chronic (HCC)   HTN (hypertension)   Type 2 diabetes mellitus ([place])   DNR (do not resuscitate)  Syncope/LOC Status post unwitnessed fall and head trauma.  Pacemaker interrogation without any significant  arrhythmias.   Multifocal intraparenchymal hemorrhage in the left frontal lobe Up to 5 distinct subcentimeter foci seen on imaging.  Received Kcentra for reversal of Eliquis.  Last dose of Eliquis on 2/24 evening.  Neurosurgery was consulted who did not recommend further imaging but observation.  Neurosurgery to follow the patient  Left orbital floor fracture/facial fracture minimally displaced medial wall of the left maxillary sinus and frontal sinus possible fracture of left ethmoidal sinus.  Comminuted nasal process of maxilla bilaterally.  Trauma surgery is following.  Spoke with PA for trauma surgery.  I also spoke with ENT Dr. Georga Kaufmann who recommended conservative treatment and outpatient follow-up.  Hypokalemia Potassium still low at 3.0.  Continue to replenish potassium.  Levels in a.m.  Check magnesium level as well.  Give the 2 g of IV magnesium sulfate.  Hx of chronic atrial fibrillation Continue to hold Eliquis.  CHA2DS2-VASc of 5.  Long-term risk versus benefits of anticoagulation to be discussed with cardiology Dr. Nehemiah Massed at [facility] in [employer].  Elevated lactate.  No obvious signs of infection.  Covid test was negative.  UA was unremarkable.  Continue IV fluids hydration.  Repeat lactic acid trended down to 2.5.  Essential HTN  continue metoprolol succinate .  Monitor blood pressure.  Avoid hypertension.  Type 2 diabetes mellitus Hemoglobin A1c of 6.3.  Continue sliding scale insulin Accu-Chek, diabetic diet.  Latest POC glucose of 113   DVT prophylaxis: SCDs Start: 07/13/20 1906   Code Status:  DNR  Family Communication: Spoke with the patient's daughter on the phone and updated her about the clinical condition of the patient.  Status is: Observation  The patient will require care spanning >  2 midnights and should be moved to inpatient because: Altered mental status, IV treatments appropriate due to intensity of illness or inability to take PO and  Inpatient level of care appropriate due to severity of illness  Dispo: The patient is from: Independent living facility              Anticipated d/c is to: Independent living facility/rehabilitation.  Will need PT evaluation.              Patient currently is not medically stable to d/c.   Difficult to place patient No   Consultants:  Neurosurgery  ENT  Trauma surgery  Procedures:  None  Anti-infectives:  . None  Anti-infectives (From admission, onward)   None      Subjective: Today, patient was seen and examined at bedside.  Patient complains of mild nausea but no headache.  Has mild facial discomfort.  Was eating.  Alert awake and communicative.  Objective: Vitals:   07/14/20 0509 07/14/20 0743  BP: 108/60 (!) 130/56  Pulse: 85 83  Resp: 19 20  Temp:  97.9 F (36.6 C)  SpO2: 93% 93%    Intake/Output Summary (Last 24 hours) at 07/14/2020 0825 Last data filed at 07/14/2020 0419 Gross per 24 hour  Intake --  Output 1400 ml  Net -1400 ml   Filed Weights   07/13/20 1502 07/13/20 2100  Weight: 56.7 kg 56.1 kg   Body mass index is 22.62 kg/m.   Physical Exam:  GENERAL: Patient is alert awake and oriented. Not in obvious distress.  Elderly female HENT: No scleral pallor or icterus. Oral mucosa is moist.  Left periorbital ecchymosis laceration. NECK: is supple, no gross swelling noted. CHEST: Clear to auscultation. No crackles or wheezes.  Diminished breath sounds bilaterally.  Chest wall pacemaker in place CVS: S1 and S2 heard, no murmur. Regular rate and rhythm.  ABDOMEN: Soft, non-tender, bowel sounds are present. EXTREMITIES: No edema. CNS: Cranial nerves are intact. No focal motor deficits. SKIN: warm and dry without rashes.  Data Review: I have personally reviewed the following laboratory data and studies,  CBC: Recent Labs  Lab 07/13/20 1454 07/13/20 1509 07/14/20 0317  WBC 7.5  --  8.6  HGB 14.2 15.0 12.2  HCT 43.0 44.0 35.1*  MCV 95.3   --  92.1  PLT 152  --  220*   Basic Metabolic Panel: Recent Labs  Lab 07/13/20 1454 07/13/20 1509 07/13/20 1513 07/14/20 0317  NA 141 142  --  137  K 3.0* 2.9*  --  3.0*  CL 104 103  --  101  CO2 24  --   --  26  GLUCOSE 236* 227*  --  129*  BUN 22 24*  --  17  CREATININE 0.96 0.80  --  0.86  CALCIUM 8.7*  --   --  8.2*  MG  --   --  1.8  --    Liver Function Tests: Recent Labs  Lab 07/13/20 1454  AST 24  ALT 19  ALKPHOS 50  BILITOT 1.7*  PROT 6.5  ALBUMIN 3.6   No results for input(s): LIPASE, AMYLASE in the last 168 hours. No results for input(s): AMMONIA in the last 168 hours. Cardiac Enzymes: No results for input(s): CKTOTAL, CKMB, CKMBINDEX, TROPONINI in the last 168 hours. BNP (last 3 results) No results for input(s): BNP in the last 8760 hours.  ProBNP (last 3 results) No results for input(s): PROBNP in the last 8760 hours.  CBG:  Recent Labs  Lab 07/13/20 2044 07/13/20 2113 07/14/20 0603  GLUCAP 142* 151* 113*   Recent Results (from the past 240 hour(s))  Resp Panel by RT-PCR (Flu A&B, Covid) Nasopharyngeal Swab     Status: None   Collection Time: 07/13/20  2:54 PM   Specimen: Nasopharyngeal Swab; Nasopharyngeal(NP) swabs in vial transport medium  Result Value Ref Range Status   SARS Coronavirus 2 by RT PCR NEGATIVE NEGATIVE Final    Comment: (NOTE) SARS-CoV-2 target nucleic acids are NOT DETECTED.  The SARS-CoV-2 RNA is generally detectable in upper respiratory specimens during the acute phase of infection. The lowest concentration of SARS-CoV-2 viral copies this assay can detect is 138 copies/mL. A negative result does not preclude SARS-Cov-2 infection and should not be used as the sole basis for treatment or other patient management decisions. A negative result may occur with  improper specimen collection/handling, submission of specimen other than nasopharyngeal swab, presence of viral mutation(s) within the areas targeted by this assay,  and inadequate number of viral copies(<138 copies/mL). A negative result must be combined with clinical observations, patient history, and epidemiological information. The expected result is Negative.  Fact Sheet for Patients:  EntrepreneurPulse.com.au  Fact Sheet for Healthcare Providers:  IncredibleEmployment.be  This test is no t yet approved or cleared by the [place] FDA and  has been authorized for detection and/or diagnosis of SARS-CoV-2 by FDA under an Emergency Use Authorization (EUA). This EUA will remain  in effect (meaning this test can be used) for the duration of the COVID-19 declaration under Section 564(b)(1) of the Act, 21 U.S.C.section 360bbb-3(b)(1), unless the authorization is terminated  or revoked sooner.       Influenza A by PCR NEGATIVE NEGATIVE Final   Influenza B by PCR NEGATIVE NEGATIVE Final    Comment: (NOTE) The Xpert Xpress SARS-CoV-2/FLU/RSV plus assay is intended as an aid in the diagnosis of influenza from Nasopharyngeal swab specimens and should not be used as a sole basis for treatment. Nasal washings and aspirates are unacceptable for Xpert Xpress SARS-CoV-2/FLU/RSV testing.  Fact Sheet for Patients: EntrepreneurPulse.com.au  Fact Sheet for Healthcare Providers: IncredibleEmployment.be  This test is not yet approved or cleared by the [place] FDA and has been authorized for detection and/or diagnosis of SARS-CoV-2 by FDA under an Emergency Use Authorization (EUA). This EUA will remain in effect (meaning this test can be used) for the duration of the COVID-19 declaration under Section 564(b)(1) of the Act, 21 U.S.C. section 360bbb-3(b)(1), unless the authorization is terminated or revoked.  Performed at [facility], [place] [address] [place], [place] [address]      Studies: CT HEAD WO CONTRAST  Result Date: 07/14/2020 CLINICAL DATA:  87 year old  female status post fall with posttraumatic shear hemorrhages or hemorrhagic contusions. EXAM: CT HEAD WITHOUT CONTRAST TECHNIQUE: Contiguous axial images were obtained from the base of the skull through the vertex without intravenous contrast. COMPARISON:  Head CT 07/13/2020. FINDINGS: Brain: There are trace bilateral subdural hematomas or less likely posttraumatic subdural hygromas (coronal image 24 and series 3, image 17. These are more apparent since yesterday. Minimal leftward midline shift is stable. Basilar cisterns remain normal. Several small white matter and gray-white matter junction parenchymal hemorrhages in the anterior left frontal lobe are stable since yesterday. Possible small surface hemorrhagic contusion or trace subarachnoid hemorrhage on series 3, image 20. No significant left frontal lobe edema or mass effect. No new parenchymal hemorrhage. Stable gray-white matter differentiation elsewhere. Trace subarachnoid hemorrhages  layering in both sylvian fissures. No intraventricular hemorrhage identified. No ventriculomegaly. No cortically based acute infarct identified. Vascular: Extensive Calcified atherosclerosis at the skull base. Skull: Left orbital floor and nondisplaced posterior left maxillary sinus fractures are visible. No calvarium fracture identified. Sinuses/Orbits: Stable hemorrhage in the left maxillary sinus. Stable small volume hemorrhage layering in the left frontal sinus. Mild left ethmoid fluid is stable. Right paranasal sinuses, tympanic cavities and mastoids remain clear. Other: Left face and scalp contusion/hematoma. Globes and intraorbital soft tissues remain normal. IMPRESSION: 1. There are trace bilateral subdural hematomas, more apparent than yesterday. Minimal associated leftward midline shift is stable. 2. Several small anterior left frontal lobe shear hemorrhages or hemorrhagic contusions are stable since yesterday. Associated trace subarachnoid hemorrhage not  significantly changed. 3. No new intracranial abnormality. 4. Left orbit and maxilla fractures with hemorrhage in the left paranasal sinuses again noted. No calvarium fracture identified. Electronically Signed   By: Genevie Ann M.D.   On: 07/14/2020 07:32   CT HEAD WO CONTRAST  Result Date: 07/13/2020 CLINICAL DATA:  Facial trauma, fall EXAM: CT HEAD WITHOUT CONTRAST CT MAXILLOFACIAL WITHOUT CONTRAST CT CERVICAL SPINE WITHOUT CONTRAST TECHNIQUE: Multidetector CT imaging of the head, cervical spine, and maxillofacial structures were performed using the standard protocol without intravenous contrast. Multiplanar CT image reconstructions of the cervical spine and maxillofacial structures were also generated. COMPARISON:  None. FINDINGS: CT HEAD FINDINGS Brain: Patchy and confluent areas of decreased attenuation are noted throughout the deep and periventricular white matter of the cerebral hemispheres bilaterally, compatible with chronic microvascular ischemic disease. No evidence of large-territorial acute infarction. At least four distinct hyperdense foci within the left frontal lobe measuring 6 x 12mm, 6x4 mm, 2x2 mm, 2x7mm (4:18-21). Possible other punctate foci more superiorly (4:24). These appear to be located at the gray-white matter junction. No definite extra-axial collection. No definite pneumocephalus. No mass effect or midline shift. No hydrocephalus. Basilar cisterns are patent. Vascular: No hyperdense vessel. Atherosclerotic calcifications are present within the cavernous internal carotid and vertebral arteries. Skull: No acute fracture or focal lesion. Other: None. CT MAXILLOFACIAL FINDINGS Osseous: Markedly comminuted and 6 mm depressed left orbital floor fracture. Comminuted nasal process of bilateral maxilla fractures (6:88-92). This is noted to extend posteriorly on the left side to include a minimally displaced fracture of the anterior wall of the left maxillary sinus (6:77) as well as comminuted  fracture of the medial wall of the left maxillary sinus (9:36). Left frontal sinus fracture (10:43) that is minimally displaced. Likely left ethmoid sinus fracture. No definite lamina papyracea fracture. No definite crista galli or cribriform plates bilaterally. Sinuses/Orbits: Air-fluid level with hyperdense fluid within the left maxillary sinus. Air-fluid level within the left frontal sinus and ethmoid sinuses. Otherwise the remaining paranasal sinuses and mastoid air cells are clear. Bilateral lens replacement. Otherwise the orbits are unremarkable. Soft tissues: Soft tissue edema the left periorbital region with associated subcutaneus soft tissue emphysema and hematoma formation. Soft tissue edema and hematoma extends inferiorly to the left maxillary subcutaneus soft tissues. CT CERVICAL SPINE FINDINGS Alignment: Grade 1 anterolisthesis of C4 on C5, C6 on C7, C7 on T1. Otherwise normal. Skull base and vertebrae: Multilevel degenerative changes of the spine worse at the C5-C6 level. No acute fracture. No aggressive appearing focal osseous lesion or focal pathologic process. Soft tissues and spinal canal: No prevertebral fluid or swelling. No visible canal hematoma. Upper chest: Trace biapical pleural/pulmonary scarring. Other: None. IMPRESSION: 1. At least five distinct subcentimeter foci of intraparenchymal  hemorrhage within the left frontal lobe. Underlying mass lesions not excluded. 2. No large territorial ischemic infarction identified. 3. Markedly comminuted and 6 mm depressed left orbital floor fracture. Inferior rectus muscle may be involved. Recommend clinical correlation for entrapment. 4. Minimally displaced anterior wall and comminuted medial wall fracture of the left maxillary sinus. Associated hemorrhage within the left maxillary sinus. 5. Minimally displaced fractures of the left frontal sinus. Possible left ethmoid sinus fracture. 6. Comminuted nasal process of the maxilla fractures bilaterally. 7.  Left cribriform plate fracture not excluded. No pneumocephalus identified. 8. No acute displaced fracture or traumatic listhesis of the cervical spine. These results were called by telephone at the time of interpretation on 07/13/2020 at 4:14 pm to provider Dr. Rex Kras, who verbally acknowledged these results. Electronically Signed   By: Iven Finn M.D.   On: 07/13/2020 16:26   CT CERVICAL SPINE WO CONTRAST  Result Date: 07/13/2020 CLINICAL DATA:  Facial trauma, fall EXAM: CT HEAD WITHOUT CONTRAST CT MAXILLOFACIAL WITHOUT CONTRAST CT CERVICAL SPINE WITHOUT CONTRAST TECHNIQUE: Multidetector CT imaging of the head, cervical spine, and maxillofacial structures were performed using the standard protocol without intravenous contrast. Multiplanar CT image reconstructions of the cervical spine and maxillofacial structures were also generated. COMPARISON:  None. FINDINGS: CT HEAD FINDINGS Brain: Patchy and confluent areas of decreased attenuation are noted throughout the deep and periventricular white matter of the cerebral hemispheres bilaterally, compatible with chronic microvascular ischemic disease. No evidence of large-territorial acute infarction. At least four distinct hyperdense foci within the left frontal lobe measuring 6 x 16mm, 6x4 mm, 2x2 mm, 2x49mm (4:18-21). Possible other punctate foci more superiorly (4:24). These appear to be located at the gray-white matter junction. No definite extra-axial collection. No definite pneumocephalus. No mass effect or midline shift. No hydrocephalus. Basilar cisterns are patent. Vascular: No hyperdense vessel. Atherosclerotic calcifications are present within the cavernous internal carotid and vertebral arteries. Skull: No acute fracture or focal lesion. Other: None. CT MAXILLOFACIAL FINDINGS Osseous: Markedly comminuted and 6 mm depressed left orbital floor fracture. Comminuted nasal process of bilateral maxilla fractures (6:88-92). This is noted to extend posteriorly  on the left side to include a minimally displaced fracture of the anterior wall of the left maxillary sinus (6:77) as well as comminuted fracture of the medial wall of the left maxillary sinus (9:36). Left frontal sinus fracture (10:43) that is minimally displaced. Likely left ethmoid sinus fracture. No definite lamina papyracea fracture. No definite crista galli or cribriform plates bilaterally. Sinuses/Orbits: Air-fluid level with hyperdense fluid within the left maxillary sinus. Air-fluid level within the left frontal sinus and ethmoid sinuses. Otherwise the remaining paranasal sinuses and mastoid air cells are clear. Bilateral lens replacement. Otherwise the orbits are unremarkable. Soft tissues: Soft tissue edema the left periorbital region with associated subcutaneus soft tissue emphysema and hematoma formation. Soft tissue edema and hematoma extends inferiorly to the left maxillary subcutaneus soft tissues. CT CERVICAL SPINE FINDINGS Alignment: Grade 1 anterolisthesis of C4 on C5, C6 on C7, C7 on T1. Otherwise normal. Skull base and vertebrae: Multilevel degenerative changes of the spine worse at the C5-C6 level. No acute fracture. No aggressive appearing focal osseous lesion or focal pathologic process. Soft tissues and spinal canal: No prevertebral fluid or swelling. No visible canal hematoma. Upper chest: Trace biapical pleural/pulmonary scarring. Other: None. IMPRESSION: 1. At least five distinct subcentimeter foci of intraparenchymal hemorrhage within the left frontal lobe. Underlying mass lesions not excluded. 2. No large territorial ischemic infarction identified. 3. Markedly comminuted  and 6 mm depressed left orbital floor fracture. Inferior rectus muscle may be involved. Recommend clinical correlation for entrapment. 4. Minimally displaced anterior wall and comminuted medial wall fracture of the left maxillary sinus. Associated hemorrhage within the left maxillary sinus. 5. Minimally displaced  fractures of the left frontal sinus. Possible left ethmoid sinus fracture. 6. Comminuted nasal process of the maxilla fractures bilaterally. 7. Left cribriform plate fracture not excluded. No pneumocephalus identified. 8. No acute displaced fracture or traumatic listhesis of the cervical spine. These results were called by telephone at the time of interpretation on 07/13/2020 at 4:14 pm to provider Dr. Rex Kras, who verbally acknowledged these results. Electronically Signed   By: Iven Finn M.D.   On: 07/13/2020 16:26   DG Pelvis Portable  Result Date: 07/13/2020 CLINICAL DATA:  Pain following fall EXAM: PORTABLE PELVIS 1-2 VIEWS COMPARISON:  None. FINDINGS: Frontal pelvis obtained. There is screw and nail fixation in the proximal left femur with alignment at the previous fracture site essentially anatomic. Screw tip in proximal femoral head. There is no appreciable acute fracture or dislocation. There is moderate symmetric narrowing of each hip joint. There is spurring in the pubic symphysis region. No erosion. There is extensive common femoral, superficial femoral, and profunda femoral artery calcification bilaterally. There is degenerative change in the lower lumbar spine. IMPRESSION: Postoperative change proximal left femur. No acute fracture or dislocation. Symmetric moderate narrowing of each hip joint. Degenerative change in lower lumbar spine. Multiple foci of arterial vascular calcification noted. Electronically Signed   By: Lowella Grip III M.D.   On: 07/13/2020 15:38   DG Hand 2 View Left  Result Date: 07/13/2020 CLINICAL DATA:  Pain following fall EXAM: LEFT HAND - 2 VIEW COMPARISON:  None. FINDINGS: Frontal and lateral views were obtained. No fracture or dislocation. There is osteoarthritic change in all PIP and DIP joints as well as in the first IP joint. No erosive change. There are multiple foci of arterial vascular calcification. IMPRESSION: No acute fracture or dislocation.  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 Foci of vascular calcification noted at several sites. Electronically Signed   By: Lowella Grip III M.D.   On: 07/13/2020 15:40   DG Chest Port 1 View  Result Date: 07/13/2020 CLINICAL DATA:  Pain following fall EXAM: PORTABLE CHEST 1 VIEW COMPARISON:  None FINDINGS: Lungs are clear. Heart is mildly enlarged with pacemaker leads attached to the right atrium and right ventricle. No adenopathy. There is aortic atherosclerosis. No pneumothorax. No bone lesions. IMPRESSION: Cardiomegaly with pacemaker leads attached to right atrium and right ventricle. Lungs clear. No pneumothorax. Aortic Atherosclerosis (ICD10-I70.0). Electronically Signed   By: Lowella Grip III M.D.   On: 07/13/2020 15:39   CT MAXILLOFACIAL WO CONTRAST  Result Date: 07/13/2020 CLINICAL DATA:  Facial trauma, fall EXAM: CT HEAD WITHOUT CONTRAST CT MAXILLOFACIAL WITHOUT CONTRAST CT CERVICAL SPINE WITHOUT CONTRAST TECHNIQUE: Multidetector CT imaging of the head, cervical spine, and maxillofacial structures were performed using the standard protocol without intravenous contrast. Multiplanar CT image reconstructions of the cervical spine and maxillofacial structures were also generated. COMPARISON:  None. FINDINGS: CT HEAD FINDINGS Brain: Patchy and confluent areas of decreased attenuation are noted throughout the deep and periventricular white matter of the cerebral hemispheres bilaterally, compatible with chronic microvascular ischemic disease. No evidence of large-territorial acute infarction. At least four distinct hyperdense foci within the left frontal lobe measuring 6 x 65mm, 6x4 mm, 2x2 mm, 2x52mm (4:18-21). Possible other punctate foci more superiorly (4:24). These  appear to be located at the gray-white matter junction. No definite extra-axial collection. No definite pneumocephalus. No mass effect or midline shift. No hydrocephalus. Basilar cisterns are patent. Vascular: No hyperdense  vessel. Atherosclerotic calcifications are present within the cavernous internal carotid and vertebral arteries. Skull: No acute fracture or focal lesion. Other: None. CT MAXILLOFACIAL FINDINGS Osseous: Markedly comminuted and 6 mm depressed left orbital floor fracture. Comminuted nasal process of bilateral maxilla fractures (6:88-92). This is noted to extend posteriorly on the left side to include a minimally displaced fracture of the anterior wall of the left maxillary sinus (6:77) as well as comminuted fracture of the medial wall of the left maxillary sinus (9:36). Left frontal sinus fracture (10:43) that is minimally displaced. Likely left ethmoid sinus fracture. No definite lamina papyracea fracture. No definite crista galli or cribriform plates bilaterally. Sinuses/Orbits: Air-fluid level with hyperdense fluid within the left maxillary sinus. Air-fluid level within the left frontal sinus and ethmoid sinuses. Otherwise the remaining paranasal sinuses and mastoid air cells are clear. Bilateral lens replacement. Otherwise the orbits are unremarkable. Soft tissues: Soft tissue edema the left periorbital region with associated subcutaneus soft tissue emphysema and hematoma formation. Soft tissue edema and hematoma extends inferiorly to the left maxillary subcutaneus soft tissues. CT CERVICAL SPINE FINDINGS Alignment: Grade 1 anterolisthesis of C4 on C5, C6 on C7, C7 on T1. Otherwise normal. Skull base and vertebrae: Multilevel degenerative changes of the spine worse at the C5-C6 level. No acute fracture. No aggressive appearing focal osseous lesion or focal pathologic process. Soft tissues and spinal canal: No prevertebral fluid or swelling. No visible canal hematoma. Upper chest: Trace biapical pleural/pulmonary scarring. Other: None. IMPRESSION: 1. At least five distinct subcentimeter foci of intraparenchymal hemorrhage within the left frontal lobe. Underlying mass lesions not excluded. 2. No large territorial  ischemic infarction identified. 3. Markedly comminuted and 6 mm depressed left orbital floor fracture. Inferior rectus muscle may be involved. Recommend clinical correlation for entrapment. 4. Minimally displaced anterior wall and comminuted medial wall fracture of the left maxillary sinus. Associated hemorrhage within the left maxillary sinus. 5. Minimally displaced fractures of the left frontal sinus. Possible left ethmoid sinus fracture. 6. Comminuted nasal process of the maxilla fractures bilaterally. 7. Left cribriform plate fracture not excluded. No pneumocephalus identified. 8. No acute displaced fracture or traumatic listhesis of the cervical spine. These results were called by telephone at the time of interpretation on 07/13/2020 at 4:14 pm to provider Dr. Rex Kras, who verbally acknowledged these results. Electronically Signed   By: Iven Finn M.D.   On: 07/13/2020 16:26      Flora Lipps, MD  [facility] 07/14/2020  If 7PM-7AM, please contact night-coverage

## 2020-07-14 NOTE — TOC CAGE-AID Note (Signed)
Transition of Care [facility]) - CAGE-AID Screening   Patient Details  Name: Kara [name] MRN: [id number] Date of Birth: [birth year]   Clinical Narrative:  Patient denies any current alcohol or drug use.  CAGE-AID Screening:    Have You Ever Felt You Ought to Cut Down on Your Drinking or Drug Use?: No Have People Annoyed You By Critizing Your Drinking Or Drug Use?: No Have You Felt Bad Or Guilty About Your Drinking Or Drug Use?: No Have You Ever Had a Drink or Used Drugs First Thing In The Morning to Steady Your Nerves or to Get Rid of a Hangover?: No CAGE-AID Score: 0  Substance Abuse Education Offered: No (denies current alcohol or drug use)

## 2020-07-14 NOTE — Progress Notes (Signed)
Patient asked this RN if her glasses were in her bag that was brought from ER, no glasses was seen in her bag, called the ER to inquire if her glasses were seen or forgotten down there, they searched and called back that there were no glasses found in her room down in the ER, same related back to pt. Obasogie-Asidi, Shekita Boyden Efe

## 2020-07-15 DIAGNOSIS — Z66 Do not resuscitate: Secondary | ICD-10-CM | POA: Diagnosis not present

## 2020-07-15 DIAGNOSIS — I619 Nontraumatic intracerebral hemorrhage, unspecified: Secondary | ICD-10-CM | POA: Diagnosis not present

## 2020-07-15 DIAGNOSIS — S0292XA Unspecified fracture of facial bones, initial encounter for closed fracture: Secondary | ICD-10-CM | POA: Diagnosis not present

## 2020-07-15 DIAGNOSIS — I482 Chronic atrial fibrillation, unspecified: Secondary | ICD-10-CM | POA: Diagnosis not present

## 2020-07-15 LAB — MAGNESIUM: Magnesium: 2.1 mg/dL (ref 1.7–2.4)

## 2020-07-15 LAB — PHOSPHORUS: Phosphorus: 2 mg/dL — ABNORMAL LOW (ref 2.5–4.6)

## 2020-07-15 LAB — BASIC METABOLIC PANEL
Anion gap: 8 (ref 5–15)
BUN: 14 mg/dL (ref 8–23)
CO2: 28 mmol/L (ref 22–32)
Calcium: 8.5 mg/dL — ABNORMAL LOW (ref 8.9–10.3)
Chloride: 104 mmol/L (ref 98–111)
Creatinine, Ser: 0.77 mg/dL (ref 0.44–1.00)
GFR, Estimated: >60 mL/min (ref >60)
Glucose, Bld: 150 mg/dL — ABNORMAL HIGH (ref 70–99)
Potassium: 4.4 mmol/L (ref 3.5–5.1)
Sodium: 140 mmol/L (ref 135–145)

## 2020-07-15 LAB — CBC
HCT: 40.7 % (ref 36.0–46.0)
Hemoglobin: 13.5 g/dL (ref 12.0–15.0)
MCH: 31.1 pg (ref 26.0–34.0)
MCHC: 33.2 g/dL (ref 30.0–36.0)
MCV: 93.8 fL (ref 80.0–100.0)
Platelets: 148 10*3/uL — ABNORMAL LOW (ref 150–400)
RBC: 4.34 MIL/uL (ref 3.87–5.11)
RDW: 13.2 % (ref 11.5–15.5)
WBC: 7.9 10*3/uL (ref 4.0–10.5)
nRBC: 0 % (ref 0.0–0.2)

## 2020-07-15 LAB — GLUCOSE, CAPILLARY
Glucose-Capillary: 126 mg/dL — ABNORMAL HIGH (ref 70–99)
Glucose-Capillary: 206 mg/dL — ABNORMAL HIGH (ref 70–99)
Glucose-Capillary: 141 mg/dL — ABNORMAL HIGH (ref 70–99)
Glucose-Capillary: 153 mg/dL — ABNORMAL HIGH (ref 70–99)

## 2020-07-15 MED ORDER — K PHOS MONO-SOD PHOS DI & MONO 155-852-130 MG PO TABS
500.0000 mg | ORAL_TABLET | Freq: Three times a day (TID) | ORAL | Status: AC
  Administered 2020-07-15 (×2): 500 mg via ORAL
  Filled 2020-07-15 (×3): qty 2

## 2020-07-15 MED ORDER — HYDRALAZINE HCL 10 MG PO TABS
10.0000 mg | ORAL_TABLET | Freq: Once | ORAL | Status: AC
  Administered 2020-07-15: 10 mg via ORAL
  Filled 2020-07-15: qty 1

## 2020-07-15 NOTE — Progress Notes (Addendum)
PROGRESS NOTE  Kara [name] TDV:[id number] DOB: [birth year] DOA: 07/13/2020 PCP: Pcp, No   LOS: 1 day   Brief narrative:  Patient is a 87 years old female with past medical history of sick sinus syndrome status post pacemaker, atrial fibrillation on Eliquis, hypertension, diabetes mellitus, hyperlipidemia and history of uterine cancer status post radiation in the past presented to hospital with unwitnessed fall/syncope while using her Rollator.  Patient did not remember the event.  Patient arrived as a trauma level 2 with GCS score 15 and hemodynamically stable.  No hypoglycemia.  CT head and CT Maxillofacial revealed at least 5 distinct subcentimeter foci of intraparenchymal hemorrhage in the left frontal lobe.  There was comminuted and depressed left orbital floor.  Minimally displaced anterior wall and comminuted medial wall fracture of the left maxillary sinus.  Associated hemorrhage within the left maxillary sinus.  Minimally displaced fracture of the left frontal sinus and possible left ethmoid sinus fracture.  Comminuted nasal fracture maxilla fracture bilaterally.  Patient's last dose of Eliquis was evening of 2/24.  She was given K-centra in the ED for reversal and15 mg of labetalol in the ED for hypertension. Her pacemaker was interrogated and reportedly showed no Arrhythmia. Trauma surgery, neurosurgery and facial trauma surgeon were consulted by ED physician who recommended admission for monitoring and no further imaging recommendation at this time.   Assessment/Plan:  Principal Problem:   Intraparenchymal hemorrhage of brain (HCC) Active Problems:   Syncope and collapse   Facial fracture due to fall (HCC)   Orbital fracture (HCC)   Hypokalemia   Atrial fibrillation, chronic (HCC)   HTN (hypertension)   Type 2 diabetes mellitus ([place])   DNR (do not resuscitate)  Syncope/LOC Status post unwitnessed fall and head trauma.  Pacemaker interrogation without any significant arrhythmias.   Physical therapy has seen the patient and recommend skilled nursing facility.  Multifocal intraparenchymal hemorrhage in the left frontal lobe Up to 5 distinct subcentimeter foci seen on imaging.  Received Kcentra for reversal of Eliquis.  Last dose of Eliquis on 2/24 evening.  Neurosurgery was consulted who did not recommend further imaging unless there was focal neurological deficits or altered mental status but commended observation.    Left orbital floor fracture/facial fracture minimally displaced medial wall of the left maxillary sinus and frontal sinus possible fracture of left ethmoidal sinus.  Comminuted nasal process of maxilla bilaterally.  Patient has been seen by trauma surgery and ENT.  ENT recommends outpatient follow-up in 2 weeks.  Hypokalemia Improved with replacement.  Magnesium of 2.1 potassium of 4.4.  Check levels in a.m.  Hx of chronic atrial fibrillation Continue to hold Eliquis.  CHA2DS2-VASc of 5.  Long-term risk versus benefits of anticoagulation to be discussed with cardiology Dr. Nehemiah Massed at [facility] in [place].  Neurosurgery recommends discontinuing Eliquis for at least 1 week.  Elevated lactate.  Improved with hydration.  No signs of infection.  Hypophosphatemia.  Will replenish with Neutra-Phos.  Check levels in a.m.  Essential HTN  continue metoprolol succinate .  Blood pressure is stable.  Type 2 diabetes mellitus Hemoglobin A1c of 6.3.  Continue sliding scale insulin Accu-Chek, diabetic diet.  Latest POC glucose of 126.   DVT prophylaxis: SCDs Start: 07/13/20 1906   Code Status:  DNR  Family Communication:  None today.  Unable to reach the patient's daughter on the phone today.  Spoke with the patient's daughter on the phone and updated her about the clinical condition of the patient yesterday.  Status is: Inpatient  The patient is  inpatient because: IV treatments appropriate due to intensity of illness or inability to take PO and  Inpatient level of care appropriate due to severity of illness intracranial hemorrhage  Dispo: The patient is from: Independent living facility              Anticipated d/c is to: Independent living facility with PT.  Physical therapy has recommended skilled nursing facility placement.  Patient however wishes to go back to her living state with physical therapy   Anticipated date of discharge: likely tomorrow              Patient currently is not medically stable to d/c.   Difficult to place patient No   Consultants:  Neurosurgery  ENT  Trauma surgery  Procedures:  None  Anti-infectives:  . None  Anti-infectives (From admission, onward)   None      Subjective: Today, patient was seen and examined at bedside.  Denies any nausea vomiting fever or chills. . No headache.  Objective: Vitals:   07/15/20 0500 07/15/20 0829  BP: (!) 148/92 (!) 144/81  Pulse:  86  Resp:  19  Temp:  98.1 F (36.7 C)  SpO2:  95%    Intake/Output Summary (Last 24 hours) at 07/15/2020 1031 Last data filed at 07/14/2020 2000 Gross per 24 hour  Intake --  Output 400 ml  Net -400 ml   Filed Weights   07/13/20 1502 07/13/20 2100  Weight: 56.7 kg 56.1 kg   Body mass index is 22.62 kg/m.   Physical Exam: GENERAL: Patient is alert awake and oriented. Not in obvious distress.  Elderly female HENT: No scleral pallor or icterus. Oral mucosa is moist.  Left periorbital ecchymosis laceration.  No visual loss. NECK: is supple, no gross swelling noted. CHEST: Clear to auscultation. No crackles or wheezes.  Diminished breath sounds bilaterally.  Chest wall pacemaker in place CVS: S1 and S2 heard, no murmur. Regular rate and rhythm.  ABDOMEN: Soft, non-tender, bowel sounds are present. EXTREMITIES: No edema. CNS: Cranial nerves are intact. No focal motor deficits. SKIN: warm and dry without rashes.  Data Review: I have personally reviewed the following laboratory data and  studies,  CBC: Recent Labs  Lab 07/13/20 1454 07/13/20 1509 07/14/20 0317 07/15/20 0232  WBC 7.5  --  8.6 7.9  HGB 14.2 15.0 12.2 13.5  HCT 43.0 44.0 35.1* 40.7  MCV 95.3  --  92.1 93.8  PLT 152  --  138* 767*   Basic Metabolic Panel: Recent Labs  Lab 07/13/20 1454 07/13/20 1509 07/13/20 1513 07/14/20 0317 07/15/20 0232  NA 141 142  --  137 140  K 3.0* 2.9*  --  3.0* 4.4  CL 104 103  --  101 104  CO2 24  --   --  26 28  GLUCOSE 236* 227*  --  129* 150*  BUN 22 24*  --  17 14  CREATININE 0.96 0.80  --  0.86 0.77  CALCIUM 8.7*  --   --  8.2* 8.5*  MG  --   --  1.8  --  2.1  PHOS  --   --   --   --  2.0*   Liver Function Tests: Recent Labs  Lab 07/13/20 1454  AST 24  ALT 19  ALKPHOS 50  BILITOT 1.7*  PROT 6.5  ALBUMIN 3.6   No results for input(s): LIPASE, AMYLASE in the last 168 hours. No results  for input(s): AMMONIA in the last 168 hours. Cardiac Enzymes: No results for input(s): CKTOTAL, CKMB, CKMBINDEX, TROPONINI in the last 168 hours. BNP (last 3 results) No results for input(s): BNP in the last 8760 hours.  ProBNP (last 3 results) No results for input(s): PROBNP in the last 8760 hours.  CBG: Recent Labs  Lab 07/14/20 0603 07/14/20 1246 07/14/20 1651 07/14/20 2133 07/15/20 0612  GLUCAP 113* 177* 140* 152* 126*   Recent Results (from the past 240 hour(s))  Resp Panel by RT-PCR (Flu A&B, Covid) Nasopharyngeal Swab     Status: None   Collection Time: 07/13/20  2:54 PM   Specimen: Nasopharyngeal Swab; Nasopharyngeal(NP) swabs in vial transport medium  Result Value Ref Range Status   SARS Coronavirus 2 by RT PCR NEGATIVE NEGATIVE Final    Comment: (NOTE) SARS-CoV-2 target nucleic acids are NOT DETECTED.  The SARS-CoV-2 RNA is generally detectable in upper respiratory specimens during the acute phase of infection. The lowest concentration of SARS-CoV-2 viral copies this assay can detect is 138 copies/mL. A negative result does not preclude  SARS-Cov-2 infection and should not be used as the sole basis for treatment or other patient management decisions. A negative result may occur with  improper specimen collection/handling, submission of specimen other than nasopharyngeal swab, presence of viral mutation(s) within the areas targeted by this assay, and inadequate number of viral copies(<138 copies/mL). A negative result must be combined with clinical observations, patient history, and epidemiological information. The expected result is Negative.  Fact Sheet for Patients:  EntrepreneurPulse.com.au  Fact Sheet for Healthcare Providers:  IncredibleEmployment.be  This test is no t yet approved or cleared by the [place] FDA and  has been authorized for detection and/or diagnosis of SARS-CoV-2 by FDA under an Emergency Use Authorization (EUA). This EUA will remain  in effect (meaning this test can be used) for the duration of the COVID-19 declaration under Section 564(b)(1) of the Act, 21 U.S.C.section 360bbb-3(b)(1), unless the authorization is terminated  or revoked sooner.       Influenza A by PCR NEGATIVE NEGATIVE Final   Influenza B by PCR NEGATIVE NEGATIVE Final    Comment: (NOTE) The Xpert Xpress SARS-CoV-2/FLU/RSV plus assay is intended as an aid in the diagnosis of influenza from Nasopharyngeal swab specimens and should not be used as a sole basis for treatment. Nasal washings and aspirates are unacceptable for Xpert Xpress SARS-CoV-2/FLU/RSV testing.  Fact Sheet for Patients: EntrepreneurPulse.com.au  Fact Sheet for Healthcare Providers: IncredibleEmployment.be  This test is not yet approved or cleared by the [place] FDA and has been authorized for detection and/or diagnosis of SARS-CoV-2 by FDA under an Emergency Use Authorization (EUA). This EUA will remain in effect (meaning this test can be used) for the duration of  the COVID-19 declaration under Section 564(b)(1) of the Act, 21 U.S.C. section 360bbb-3(b)(1), unless the authorization is terminated or revoked.  Performed at [facility], [place] [address] [place], [place] [address]      Studies: CT HEAD WO CONTRAST  Result Date: 07/14/2020 CLINICAL DATA:  87 year old female status post fall with posttraumatic shear hemorrhages or hemorrhagic contusions. EXAM: CT HEAD WITHOUT CONTRAST TECHNIQUE: Contiguous axial images were obtained from the base of the skull through the vertex without intravenous contrast. COMPARISON:  Head CT 07/13/2020. FINDINGS: Brain: There are trace bilateral subdural hematomas or less likely posttraumatic subdural hygromas (coronal image 24 and series 3, image 17. These are more apparent since yesterday. Minimal leftward midline shift is  stable. Basilar cisterns remain normal. Several small white matter and gray-white matter junction parenchymal hemorrhages in the anterior left frontal lobe are stable since yesterday. Possible small surface hemorrhagic contusion or trace subarachnoid hemorrhage on series 3, image 20. No significant left frontal lobe edema or mass effect. No new parenchymal hemorrhage. Stable gray-white matter differentiation elsewhere. Trace subarachnoid hemorrhages layering in both sylvian fissures. No intraventricular hemorrhage identified. No ventriculomegaly. No cortically based acute infarct identified. Vascular: Extensive Calcified atherosclerosis at the skull base. Skull: Left orbital floor and nondisplaced posterior left maxillary sinus fractures are visible. No calvarium fracture identified. Sinuses/Orbits: Stable hemorrhage in the left maxillary sinus. Stable small volume hemorrhage layering in the left frontal sinus. Mild left ethmoid fluid is stable. Right paranasal sinuses, tympanic cavities and mastoids remain clear. Other: Left face and scalp contusion/hematoma. Globes and intraorbital soft tissues remain  normal. IMPRESSION: 1. There are trace bilateral subdural hematomas, more apparent than yesterday. Minimal associated leftward midline shift is stable. 2. Several small anterior left frontal lobe shear hemorrhages or hemorrhagic contusions are stable since yesterday. Associated trace subarachnoid hemorrhage not significantly changed. 3. No new intracranial abnormality. 4. Left orbit and maxilla fractures with hemorrhage in the left paranasal sinuses again noted. No calvarium fracture identified. Electronically Signed   By: Genevie Ann M.D.   On: 07/14/2020 07:32   CT HEAD WO CONTRAST  Result Date: 07/13/2020 CLINICAL DATA:  Facial trauma, fall EXAM: CT HEAD WITHOUT CONTRAST CT MAXILLOFACIAL WITHOUT CONTRAST CT CERVICAL SPINE WITHOUT CONTRAST TECHNIQUE: Multidetector CT imaging of the head, cervical spine, and maxillofacial structures were performed using the standard protocol without intravenous contrast. Multiplanar CT image reconstructions of the cervical spine and maxillofacial structures were also generated. COMPARISON:  None. FINDINGS: CT HEAD FINDINGS Brain: Patchy and confluent areas of decreased attenuation are noted throughout the deep and periventricular white matter of the cerebral hemispheres bilaterally, compatible with chronic microvascular ischemic disease. No evidence of large-territorial acute infarction. At least four distinct hyperdense foci within the left frontal lobe measuring 6 x 36mm, 6x4 mm, 2x2 mm, 2x37mm (4:18-21). Possible other punctate foci more superiorly (4:24). These appear to be located at the gray-white matter junction. No definite extra-axial collection. No definite pneumocephalus. No mass effect or midline shift. No hydrocephalus. Basilar cisterns are patent. Vascular: No hyperdense vessel. Atherosclerotic calcifications are present within the cavernous internal carotid and vertebral arteries. Skull: No acute fracture or focal lesion. Other: None. CT MAXILLOFACIAL FINDINGS Osseous:  Markedly comminuted and 6 mm depressed left orbital floor fracture. Comminuted nasal process of bilateral maxilla fractures (6:88-92). This is noted to extend posteriorly on the left side to include a minimally displaced fracture of the anterior wall of the left maxillary sinus (6:77) as well as comminuted fracture of the medial wall of the left maxillary sinus (9:36). Left frontal sinus fracture (10:43) that is minimally displaced. Likely left ethmoid sinus fracture. No definite lamina papyracea fracture. No definite crista galli or cribriform plates bilaterally. Sinuses/Orbits: Air-fluid level with hyperdense fluid within the left maxillary sinus. Air-fluid level within the left frontal sinus and ethmoid sinuses. Otherwise the remaining paranasal sinuses and mastoid air cells are clear. Bilateral lens replacement. Otherwise the orbits are unremarkable. Soft tissues: Soft tissue edema the left periorbital region with associated subcutaneus soft tissue emphysema and hematoma formation. Soft tissue edema and hematoma extends inferiorly to the left maxillary subcutaneus soft tissues. CT CERVICAL SPINE FINDINGS Alignment: Grade 1 anterolisthesis of C4 on C5, C6 on C7, C7 on T1. Otherwise normal.  Skull base and vertebrae: Multilevel degenerative changes of the spine worse at the C5-C6 level. No acute fracture. No aggressive appearing focal osseous lesion or focal pathologic process. Soft tissues and spinal canal: No prevertebral fluid or swelling. No visible canal hematoma. Upper chest: Trace biapical pleural/pulmonary scarring. Other: None. IMPRESSION: 1. At least five distinct subcentimeter foci of intraparenchymal hemorrhage within the left frontal lobe. Underlying mass lesions not excluded. 2. No large territorial ischemic infarction identified. 3. Markedly comminuted and 6 mm depressed left orbital floor fracture. Inferior rectus muscle may be involved. Recommend clinical correlation for entrapment. 4. Minimally  displaced anterior wall and comminuted medial wall fracture of the left maxillary sinus. Associated hemorrhage within the left maxillary sinus. 5. Minimally displaced fractures of the left frontal sinus. Possible left ethmoid sinus fracture. 6. Comminuted nasal process of the maxilla fractures bilaterally. 7. Left cribriform plate fracture not excluded. No pneumocephalus identified. 8. No acute displaced fracture or traumatic listhesis of the cervical spine. These results were called by telephone at the time of interpretation on 07/13/2020 at 4:14 pm to provider Dr. Rex Kras, who verbally acknowledged these results. Electronically Signed   By: Iven Finn M.D.   On: 07/13/2020 16:26   CT CERVICAL SPINE WO CONTRAST  Result Date: 07/13/2020 CLINICAL DATA:  Facial trauma, fall EXAM: CT HEAD WITHOUT CONTRAST CT MAXILLOFACIAL WITHOUT CONTRAST CT CERVICAL SPINE WITHOUT CONTRAST TECHNIQUE: Multidetector CT imaging of the head, cervical spine, and maxillofacial structures were performed using the standard protocol without intravenous contrast. Multiplanar CT image reconstructions of the cervical spine and maxillofacial structures were also generated. COMPARISON:  None. FINDINGS: CT HEAD FINDINGS Brain: Patchy and confluent areas of decreased attenuation are noted throughout the deep and periventricular white matter of the cerebral hemispheres bilaterally, compatible with chronic microvascular ischemic disease. No evidence of large-territorial acute infarction. At least four distinct hyperdense foci within the left frontal lobe measuring 6 x 74mm, 6x4 mm, 2x2 mm, 2x2mm (4:18-21). Possible other punctate foci more superiorly (4:24). These appear to be located at the gray-white matter junction. No definite extra-axial collection. No definite pneumocephalus. No mass effect or midline shift. No hydrocephalus. Basilar cisterns are patent. Vascular: No hyperdense vessel. Atherosclerotic calcifications are present within the  cavernous internal carotid and vertebral arteries. Skull: No acute fracture or focal lesion. Other: None. CT MAXILLOFACIAL FINDINGS Osseous: Markedly comminuted and 6 mm depressed left orbital floor fracture. Comminuted nasal process of bilateral maxilla fractures (6:88-92). This is noted to extend posteriorly on the left side to include a minimally displaced fracture of the anterior wall of the left maxillary sinus (6:77) as well as comminuted fracture of the medial wall of the left maxillary sinus (9:36). Left frontal sinus fracture (10:43) that is minimally displaced. Likely left ethmoid sinus fracture. No definite lamina papyracea fracture. No definite crista galli or cribriform plates bilaterally. Sinuses/Orbits: Air-fluid level with hyperdense fluid within the left maxillary sinus. Air-fluid level within the left frontal sinus and ethmoid sinuses. Otherwise the remaining paranasal sinuses and mastoid air cells are clear. Bilateral lens replacement. Otherwise the orbits are unremarkable. Soft tissues: Soft tissue edema the left periorbital region with associated subcutaneus soft tissue emphysema and hematoma formation. Soft tissue edema and hematoma extends inferiorly to the left maxillary subcutaneus soft tissues. CT CERVICAL SPINE FINDINGS Alignment: Grade 1 anterolisthesis of C4 on C5, C6 on C7, C7 on T1. Otherwise normal. Skull base and vertebrae: Multilevel degenerative changes of the spine worse at the C5-C6 level. No acute fracture. No aggressive appearing  focal osseous lesion or focal pathologic process. Soft tissues and spinal canal: No prevertebral fluid or swelling. No visible canal hematoma. Upper chest: Trace biapical pleural/pulmonary scarring. Other: None. IMPRESSION: 1. At least five distinct subcentimeter foci of intraparenchymal hemorrhage within the left frontal lobe. Underlying mass lesions not excluded. 2. No large territorial ischemic infarction identified. 3. Markedly comminuted and 6 mm  depressed left orbital floor fracture. Inferior rectus muscle may be involved. Recommend clinical correlation for entrapment. 4. Minimally displaced anterior wall and comminuted medial wall fracture of the left maxillary sinus. Associated hemorrhage within the left maxillary sinus. 5. Minimally displaced fractures of the left frontal sinus. Possible left ethmoid sinus fracture. 6. Comminuted nasal process of the maxilla fractures bilaterally. 7. Left cribriform plate fracture not excluded. No pneumocephalus identified. 8. No acute displaced fracture or traumatic listhesis of the cervical spine. These results were called by telephone at the time of interpretation on 07/13/2020 at 4:14 pm to provider Dr. Rex Kras, who verbally acknowledged these results. Electronically Signed   By: Iven Finn M.D.   On: 07/13/2020 16:26   DG Pelvis Portable  Result Date: 07/13/2020 CLINICAL DATA:  Pain following fall EXAM: PORTABLE PELVIS 1-2 VIEWS COMPARISON:  None. FINDINGS: Frontal pelvis obtained. There is screw and nail fixation in the proximal left femur with alignment at the previous fracture site essentially anatomic. Screw tip in proximal femoral head. There is no appreciable acute fracture or dislocation. There is moderate symmetric narrowing of each hip joint. There is spurring in the pubic symphysis region. No erosion. There is extensive common femoral, superficial femoral, and profunda femoral artery calcification bilaterally. There is degenerative change in the lower lumbar spine. IMPRESSION: Postoperative change proximal left femur. No acute fracture or dislocation. Symmetric moderate narrowing of each hip joint. Degenerative change in lower lumbar spine. Multiple foci of arterial vascular calcification noted. Electronically Signed   By: Lowella Grip III M.D.   On: 07/13/2020 15:38   DG Hand 2 View Left  Result Date: 07/13/2020 CLINICAL DATA:  Pain following fall EXAM: LEFT HAND - 2 VIEW COMPARISON:   None. FINDINGS: Frontal and lateral views were obtained. No fracture or dislocation. There is osteoarthritic change in all PIP and DIP joints as well as in the first IP joint. No erosive change. There are multiple foci of arterial vascular calcification. IMPRESSION: No acute fracture or dislocation. 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 Foci of vascular calcification noted at several sites. Electronically Signed   By: Lowella Grip III M.D.   On: 07/13/2020 15:40   DG Chest Port 1 View  Result Date: 07/13/2020 CLINICAL DATA:  Pain following fall EXAM: PORTABLE CHEST 1 VIEW COMPARISON:  None FINDINGS: Lungs are clear. Heart is mildly enlarged with pacemaker leads attached to the right atrium and right ventricle. No adenopathy. There is aortic atherosclerosis. No pneumothorax. No bone lesions. IMPRESSION: Cardiomegaly with pacemaker leads attached to right atrium and right ventricle. Lungs clear. No pneumothorax. Aortic Atherosclerosis (ICD10-I70.0). Electronically Signed   By: Lowella Grip III M.D.   On: 07/13/2020 15:39   CT MAXILLOFACIAL WO CONTRAST  Result Date: 07/13/2020 CLINICAL DATA:  Facial trauma, fall EXAM: CT HEAD WITHOUT CONTRAST CT MAXILLOFACIAL WITHOUT CONTRAST CT CERVICAL SPINE WITHOUT CONTRAST TECHNIQUE: Multidetector CT imaging of the head, cervical spine, and maxillofacial structures were performed using the standard protocol without intravenous contrast. Multiplanar CT image reconstructions of the cervical spine and maxillofacial structures were also generated. COMPARISON:  None. FINDINGS: CT HEAD FINDINGS Brain: Patchy and  confluent areas of decreased attenuation are noted throughout the deep and periventricular white matter of the cerebral hemispheres bilaterally, compatible with chronic microvascular ischemic disease. No evidence of large-territorial acute infarction. At least four distinct hyperdense foci within the left frontal lobe measuring 6 x 84mm, 6x4 mm, 2x2  mm, 2x37mm (4:18-21). Possible other punctate foci more superiorly (4:24). These appear to be located at the gray-white matter junction. No definite extra-axial collection. No definite pneumocephalus. No mass effect or midline shift. No hydrocephalus. Basilar cisterns are patent. Vascular: No hyperdense vessel. Atherosclerotic calcifications are present within the cavernous internal carotid and vertebral arteries. Skull: No acute fracture or focal lesion. Other: None. CT MAXILLOFACIAL FINDINGS Osseous: Markedly comminuted and 6 mm depressed left orbital floor fracture. Comminuted nasal process of bilateral maxilla fractures (6:88-92). This is noted to extend posteriorly on the left side to include a minimally displaced fracture of the anterior wall of the left maxillary sinus (6:77) as well as comminuted fracture of the medial wall of the left maxillary sinus (9:36). Left frontal sinus fracture (10:43) that is minimally displaced. Likely left ethmoid sinus fracture. No definite lamina papyracea fracture. No definite crista galli or cribriform plates bilaterally. Sinuses/Orbits: Air-fluid level with hyperdense fluid within the left maxillary sinus. Air-fluid level within the left frontal sinus and ethmoid sinuses. Otherwise the remaining paranasal sinuses and mastoid air cells are clear. Bilateral lens replacement. Otherwise the orbits are unremarkable. Soft tissues: Soft tissue edema the left periorbital region with associated subcutaneus soft tissue emphysema and hematoma formation. Soft tissue edema and hematoma extends inferiorly to the left maxillary subcutaneus soft tissues. CT CERVICAL SPINE FINDINGS Alignment: Grade 1 anterolisthesis of C4 on C5, C6 on C7, C7 on T1. Otherwise normal. Skull base and vertebrae: Multilevel degenerative changes of the spine worse at the C5-C6 level. No acute fracture. No aggressive appearing focal osseous lesion or focal pathologic process. Soft tissues and spinal canal: No  prevertebral fluid or swelling. No visible canal hematoma. Upper chest: Trace biapical pleural/pulmonary scarring. Other: None. IMPRESSION: 1. At least five distinct subcentimeter foci of intraparenchymal hemorrhage within the left frontal lobe. Underlying mass lesions not excluded. 2. No large territorial ischemic infarction identified. 3. Markedly comminuted and 6 mm depressed left orbital floor fracture. Inferior rectus muscle may be involved. Recommend clinical correlation for entrapment. 4. Minimally displaced anterior wall and comminuted medial wall fracture of the left maxillary sinus. Associated hemorrhage within the left maxillary sinus. 5. Minimally displaced fractures of the left frontal sinus. Possible left ethmoid sinus fracture. 6. Comminuted nasal process of the maxilla fractures bilaterally. 7. Left cribriform plate fracture not excluded. No pneumocephalus identified. 8. No acute displaced fracture or traumatic listhesis of the cervical spine. These results were called by telephone at the time of interpretation on 07/13/2020 at 4:14 pm to provider Dr. Rex Kras, who verbally acknowledged these results. Electronically Signed   By: Iven Finn M.D.   On: 07/13/2020 16:26      Flora Lipps, MD  [facility] 07/15/2020  If 7PM-7AM, please contact night-coverage

## 2020-07-15 NOTE — TOC Initial Note (Signed)
Transition of Care [facility]) - Initial/Assessment Note    Patient Details  Name: Kara [name] MRN: [id number] Date of Birth: 1922-10-08  Transition of Care [facility]) CM/SW Contact:    Geralynn Ochs, LCSW Phone Number: 07/15/2020, 10:00 AM  Clinical Narrative:     CSW met with patient to discuss recommendation for SNF. Per patient, she would rather return to her apartment and have rehab there, which [facility] has done for her before. Patient's daughter is flying in tonight and will be staying with the patient for some time to make sure she is ok on her own. CSW spoke with [name] at [facility] and confirmed that they will set up home health through [facility], they just need orders. Daughter will be in town tonight with hopeful discharge back to independent living tomorrow, if patient is stable at that time. CSW to follow.              Expected Discharge Plan: [place] Barriers to Discharge: Continued Medical Work up   Patient Goals and CMS Choice Patient states their goals for this hospitalization and ongoing recovery are:: to get back home CMS Medicare.gov Compare Post Acute Care list provided to:: Patient Choice offered to / list presented to : Patient  Expected Discharge Plan and Services Expected Discharge Plan: [place] Choice: [place] arrangements for the past 2 months: [place]: PT,OT [place] Agency: Other - See comment        Prior Living Arrangements/Services Living arrangements for the past 2 months: [place] Lives with:: Facility Resident Patient language and need for interpreter reviewed:: No Do you feel safe going back to the place where you live?: Yes      Need for Family Participation in Patient Care: No (Comment) Care giver support system in place?: Yes (comment) Current home services: DME Criminal  Activity/Legal Involvement Pertinent to Current Situation/Hospitalization: No - Comment as needed  Activities of Daily Living      Permission Sought/Granted Permission sought to share information with : Facility Retail banker granted to share information with : Yes, Verbal Permission Granted  Share Information with NAME: Lars Mage  Permission granted to share info w AGENCY: [employer] granted to share info w Relationship: Daughter     Emotional Assessment Appearance:: Appears stated age Attitude/Demeanor/Rapport: Engaged Affect (typically observed): Appropriate Orientation: : Oriented to Self,Oriented to Place,Oriented to  Time,Oriented to Situation Alcohol / Substance Use: Not Applicable Psych Involvement: No (comment)  Admission diagnosis:  Fall [W19.XXXA] Intraparenchymal hemorrhage of brain [facility]) [I61.9] Patient Active Problem List   Diagnosis Date Noted  . Intraparenchymal hemorrhage of brain ([place]) 07/13/2020  . Syncope and collapse 07/13/2020  . Facial fracture due to fall ([place]) 07/13/2020  . Orbital fracture ([place]) 07/13/2020  . Hypokalemia 07/13/2020  . Atrial fibrillation, chronic ([place]) 07/13/2020  . HTN (hypertension) 07/13/2020  . Type 2 diabetes mellitus ([place]) 07/13/2020  . DNR (do not resuscitate) 07/13/2020   PCP:  Merryl Hacker, No Pharmacy:   [place], [place] - [place] [address] Phone: [phone] Fax: [phone]     Social Determinants of Health (SDOH) Interventions    Readmission Risk Interventions No flowsheet data  found.  

## 2020-07-15 NOTE — NC FL2 (Signed)
°  [place] LEVEL OF CARE SCREENING TOOL     IDENTIFICATION  Patient Name: Kara [name] Birthdate: [birth year] Sex: female Admission Date (Current Location): 07/13/2020  [facility] and [place] Number:  Engineering geologist and Address:  [facility], [place] [address] [place], [place] [address]      Provider Number: [id number]  Attending Physician Name and Address:  Flora Lipps, MD  Relative Name and Phone Number:       Current Level of Care: Hospital Recommended Level of Care: [place] Prior Approval Number:    Date Approved/Denied:   PASRR Number: [id number]  Discharge Plan: SNF    Current Diagnoses: Patient Active Problem List   Diagnosis Date Noted   Intraparenchymal hemorrhage of brain ([place]) 07/13/2020   Syncope and collapse 07/13/2020   Facial fracture due to fall ([place]) 07/13/2020   Orbital fracture ([place]) 07/13/2020   Hypokalemia 07/13/2020   Atrial fibrillation, chronic ([place]) 07/13/2020   HTN (hypertension) 07/13/2020   Type 2 diabetes mellitus ([place]) 07/13/2020   DNR (do not resuscitate) 07/13/2020    Orientation RESPIRATION BLADDER Height & Weight     Self,Time,Situation,Place  Normal Incontinent Weight: 123 lb 10.9 oz (56.1 kg) Height:  5\' 2"  (157.5 cm)  BEHAVIORAL SYMPTOMS/MOOD NEUROLOGICAL BOWEL NUTRITION STATUS      Continent Diet (heart healthy)  AMBULATORY STATUS COMMUNICATION OF NEEDS Skin   Limited Assist Verbally Skin abrasions                       Personal Care Assistance Level of Assistance  Bathing,Feeding,Dressing Bathing Assistance: Limited assistance Feeding assistance: Independent Dressing Assistance: Limited assistance     Functional Limitations Info             SPECIAL CARE FACTORS FREQUENCY  PT (By licensed PT),OT (By licensed OT)     PT Frequency: 5x/wk OT Frequency: 5x/wk            Contractures Contractures Info: Not present    Additional  Factors Info  Code Status,Allergies Code Status Info: DNR Allergies Info: NKA           Current Medications (07/15/2020):  This is the current hospital active medication list Current Facility-Administered Medications  Medication Dose Route Frequency Provider Last Rate Last Admin   acetaminophen (TYLENOL) tablet 650 mg  650 mg Oral Q6H PRN Tu, Ching T, DO   650 mg at 07/13/20 2016   insulin aspart (novoLOG) injection 0-5 Units  0-5 Units Subcutaneous QHS Tu, Ching T, DO       insulin aspart (novoLOG) injection 0-9 Units  0-9 Units Subcutaneous TID WC Tu, Ching T, DO   1 Units at 07/15/20 0825   loperamide (IMODIUM) capsule 2 mg  2 mg Oral PRN Pokhrel, Laxman, MD   2 mg at 07/14/20 1635   metoprolol succinate (TOPROL-XL) 24 hr tablet 25 mg  25 mg Oral BID Tu, Ching T, DO   25 mg at 07/14/20 2127   morphine 2 MG/ML injection 1 mg  1 mg Intravenous Q3H PRN Tu, Ching T, DO   1 mg at 07/13/20 2017     Discharge Medications: Please see discharge summary for a list of discharge medications.  Relevant Imaging Results:  Relevant Lab Results:   Additional Information SS#: [id number]  Geralynn Ochs, LCSW

## 2020-07-15 NOTE — Progress Notes (Signed)
Subjective/Chief Complaint: Complains of soreness around left eye. Vision seems intact   Objective: Vital signs in last 24 hours: Temp:  [98.1 F (36.7 C)-99.5 F (37.5 C)] 98.1 F (36.7 C) (02/27 0829) Pulse Rate:  [75-89] 86 (02/27 0829) Resp:  [18-20] 19 (02/27 0829) BP: (144-170)/(62-92) 144/81 (02/27 0829) SpO2:  [94 %-98 %] 95 % (02/27 0829) Last BM Date: 07/14/20  Intake/Output from previous day: 02/26 0701 - 02/27 0700 In: -  Out: 400 [Urine:400] Intake/Output this shift: No intake/output data recorded.  General appearance: alert and cooperative Resp: clear to auscultation bilaterally Cardio: regular rate and rhythm GI: soft, nontender  Lab Results:  Recent Labs    07/14/20 0317 07/15/20 0232  WBC 8.6 7.9  HGB 12.2 13.5  HCT 35.1* 40.7  PLT 138* 148*   BMET Recent Labs    07/14/20 0317 07/15/20 0232  NA 137 140  K 3.0* 4.4  CL 101 104  CO2 26 28  GLUCOSE 129* 150*  BUN 17 14  CREATININE 0.86 0.77  CALCIUM 8.2* 8.5*   PT/INR Recent Labs    07/13/20 1454  LABPROT 13.9  INR 1.1   ABG No results for input(s): PHART, HCO3 in the last 72 hours.  Invalid input(s): PCO2, PO2  Studies/Results: CT HEAD WO CONTRAST  Result Date: 07/14/2020 CLINICAL DATA:  87 year old female status post fall with posttraumatic shear hemorrhages or hemorrhagic contusions. EXAM: CT HEAD WITHOUT CONTRAST TECHNIQUE: Contiguous axial images were obtained from the base of the skull through the vertex without intravenous contrast. COMPARISON:  Head CT 07/13/2020. FINDINGS: Brain: There are trace bilateral subdural hematomas or less likely posttraumatic subdural hygromas (coronal image 24 and series 3, image 17. These are more apparent since yesterday. Minimal leftward midline shift is stable. Basilar cisterns remain normal. Several small white matter and gray-white matter junction parenchymal hemorrhages in the anterior left frontal lobe are stable since yesterday.  Possible small surface hemorrhagic contusion or trace subarachnoid hemorrhage on series 3, image 20. No significant left frontal lobe edema or mass effect. No new parenchymal hemorrhage. Stable gray-white matter differentiation elsewhere. Trace subarachnoid hemorrhages layering in both sylvian fissures. No intraventricular hemorrhage identified. No ventriculomegaly. No cortically based acute infarct identified. Vascular: Extensive Calcified atherosclerosis at the skull base. Skull: Left orbital floor and nondisplaced posterior left maxillary sinus fractures are visible. No calvarium fracture identified. Sinuses/Orbits: Stable hemorrhage in the left maxillary sinus. Stable small volume hemorrhage layering in the left frontal sinus. Mild left ethmoid fluid is stable. Right paranasal sinuses, tympanic cavities and mastoids remain clear. Other: Left face and scalp contusion/hematoma. Globes and intraorbital soft tissues remain normal. IMPRESSION: 1. There are trace bilateral subdural hematomas, more apparent than yesterday. Minimal associated leftward midline shift is stable. 2. Several small anterior left frontal lobe shear hemorrhages or hemorrhagic contusions are stable since yesterday. Associated trace subarachnoid hemorrhage not significantly changed. 3. No new intracranial abnormality. 4. Left orbit and maxilla fractures with hemorrhage in the left paranasal sinuses again noted. No calvarium fracture identified. Electronically Signed   By: Genevie Ann M.D.   On: 07/14/2020 07:32   CT HEAD WO CONTRAST  Result Date: 07/13/2020 CLINICAL DATA:  Facial trauma, fall EXAM: CT HEAD WITHOUT CONTRAST CT MAXILLOFACIAL WITHOUT CONTRAST CT CERVICAL SPINE WITHOUT CONTRAST TECHNIQUE: Multidetector CT imaging of the head, cervical spine, and maxillofacial structures were performed using the standard protocol without intravenous contrast. Multiplanar CT image reconstructions of the cervical spine and maxillofacial structures were  also generated. COMPARISON:  None. FINDINGS: CT HEAD FINDINGS Brain: Patchy and confluent areas of decreased attenuation are noted throughout the deep and periventricular white matter of the cerebral hemispheres bilaterally, compatible with chronic microvascular ischemic disease. No evidence of large-territorial acute infarction. At least four distinct hyperdense foci within the left frontal lobe measuring 6 x 10mm, 6x4 mm, 2x2 mm, 2x64mm (4:18-21). Possible other punctate foci more superiorly (4:24). These appear to be located at the gray-white matter junction. No definite extra-axial collection. No definite pneumocephalus. No mass effect or midline shift. No hydrocephalus. Basilar cisterns are patent. Vascular: No hyperdense vessel. Atherosclerotic calcifications are present within the cavernous internal carotid and vertebral arteries. Skull: No acute fracture or focal lesion. Other: None. CT MAXILLOFACIAL FINDINGS Osseous: Markedly comminuted and 6 mm depressed left orbital floor fracture. Comminuted nasal process of bilateral maxilla fractures (6:88-92). This is noted to extend posteriorly on the left side to include a minimally displaced fracture of the anterior wall of the left maxillary sinus (6:77) as well as comminuted fracture of the medial wall of the left maxillary sinus (9:36). Left frontal sinus fracture (10:43) that is minimally displaced. Likely left ethmoid sinus fracture. No definite lamina papyracea fracture. No definite crista galli or cribriform plates bilaterally. Sinuses/Orbits: Air-fluid level with hyperdense fluid within the left maxillary sinus. Air-fluid level within the left frontal sinus and ethmoid sinuses. Otherwise the remaining paranasal sinuses and mastoid air cells are clear. Bilateral lens replacement. Otherwise the orbits are unremarkable. Soft tissues: Soft tissue edema the left periorbital region with associated subcutaneus soft tissue emphysema and hematoma formation. Soft  tissue edema and hematoma extends inferiorly to the left maxillary subcutaneus soft tissues. CT CERVICAL SPINE FINDINGS Alignment: Grade 1 anterolisthesis of C4 on C5, C6 on C7, C7 on T1. Otherwise normal. Skull base and vertebrae: Multilevel degenerative changes of the spine worse at the C5-C6 level. No acute fracture. No aggressive appearing focal osseous lesion or focal pathologic process. Soft tissues and spinal canal: No prevertebral fluid or swelling. No visible canal hematoma. Upper chest: Trace biapical pleural/pulmonary scarring. Other: None. IMPRESSION: 1. At least five distinct subcentimeter foci of intraparenchymal hemorrhage within the left frontal lobe. Underlying mass lesions not excluded. 2. No large territorial ischemic infarction identified. 3. Markedly comminuted and 6 mm depressed left orbital floor fracture. Inferior rectus muscle may be involved. Recommend clinical correlation for entrapment. 4. Minimally displaced anterior wall and comminuted medial wall fracture of the left maxillary sinus. Associated hemorrhage within the left maxillary sinus. 5. Minimally displaced fractures of the left frontal sinus. Possible left ethmoid sinus fracture. 6. Comminuted nasal process of the maxilla fractures bilaterally. 7. Left cribriform plate fracture not excluded. No pneumocephalus identified. 8. No acute displaced fracture or traumatic listhesis of the cervical spine. These results were called by telephone at the time of interpretation on 07/13/2020 at 4:14 pm to provider Dr. Rex Kras, who verbally acknowledged these results. Electronically Signed   By: Iven Finn M.D.   On: 07/13/2020 16:26   CT CERVICAL SPINE WO CONTRAST  Result Date: 07/13/2020 CLINICAL DATA:  Facial trauma, fall EXAM: CT HEAD WITHOUT CONTRAST CT MAXILLOFACIAL WITHOUT CONTRAST CT CERVICAL SPINE WITHOUT CONTRAST TECHNIQUE: Multidetector CT imaging of the head, cervical spine, and maxillofacial structures were performed using the  standard protocol without intravenous contrast. Multiplanar CT image reconstructions of the cervical spine and maxillofacial structures were also generated. COMPARISON:  None. FINDINGS: CT HEAD FINDINGS Brain: Patchy and confluent areas of decreased attenuation are noted throughout the deep and periventricular white  matter of the cerebral hemispheres bilaterally, compatible with chronic microvascular ischemic disease. No evidence of large-territorial acute infarction. At least four distinct hyperdense foci within the left frontal lobe measuring 6 x 34mm, 6x4 mm, 2x2 mm, 2x78mm (4:18-21). Possible other punctate foci more superiorly (4:24). These appear to be located at the gray-white matter junction. No definite extra-axial collection. No definite pneumocephalus. No mass effect or midline shift. No hydrocephalus. Basilar cisterns are patent. Vascular: No hyperdense vessel. Atherosclerotic calcifications are present within the cavernous internal carotid and vertebral arteries. Skull: No acute fracture or focal lesion. Other: None. CT MAXILLOFACIAL FINDINGS Osseous: Markedly comminuted and 6 mm depressed left orbital floor fracture. Comminuted nasal process of bilateral maxilla fractures (6:88-92). This is noted to extend posteriorly on the left side to include a minimally displaced fracture of the anterior wall of the left maxillary sinus (6:77) as well as comminuted fracture of the medial wall of the left maxillary sinus (9:36). Left frontal sinus fracture (10:43) that is minimally displaced. Likely left ethmoid sinus fracture. No definite lamina papyracea fracture. No definite crista galli or cribriform plates bilaterally. Sinuses/Orbits: Air-fluid level with hyperdense fluid within the left maxillary sinus. Air-fluid level within the left frontal sinus and ethmoid sinuses. Otherwise the remaining paranasal sinuses and mastoid air cells are clear. Bilateral lens replacement. Otherwise the orbits are unremarkable.  Soft tissues: Soft tissue edema the left periorbital region with associated subcutaneus soft tissue emphysema and hematoma formation. Soft tissue edema and hematoma extends inferiorly to the left maxillary subcutaneus soft tissues. CT CERVICAL SPINE FINDINGS Alignment: Grade 1 anterolisthesis of C4 on C5, C6 on C7, C7 on T1. Otherwise normal. Skull base and vertebrae: Multilevel degenerative changes of the spine worse at the C5-C6 level. No acute fracture. No aggressive appearing focal osseous lesion or focal pathologic process. Soft tissues and spinal canal: No prevertebral fluid or swelling. No visible canal hematoma. Upper chest: Trace biapical pleural/pulmonary scarring. Other: None. IMPRESSION: 1. At least five distinct subcentimeter foci of intraparenchymal hemorrhage within the left frontal lobe. Underlying mass lesions not excluded. 2. No large territorial ischemic infarction identified. 3. Markedly comminuted and 6 mm depressed left orbital floor fracture. Inferior rectus muscle may be involved. Recommend clinical correlation for entrapment. 4. Minimally displaced anterior wall and comminuted medial wall fracture of the left maxillary sinus. Associated hemorrhage within the left maxillary sinus. 5. Minimally displaced fractures of the left frontal sinus. Possible left ethmoid sinus fracture. 6. Comminuted nasal process of the maxilla fractures bilaterally. 7. Left cribriform plate fracture not excluded. No pneumocephalus identified. 8. No acute displaced fracture or traumatic listhesis of the cervical spine. These results were called by telephone at the time of interpretation on 07/13/2020 at 4:14 pm to provider Dr. Rex Kras, who verbally acknowledged these results. Electronically Signed   By: Iven Finn M.D.   On: 07/13/2020 16:26   DG Pelvis Portable  Result Date: 07/13/2020 CLINICAL DATA:  Pain following fall EXAM: PORTABLE PELVIS 1-2 VIEWS COMPARISON:  None. FINDINGS: Frontal pelvis obtained.  There is screw and nail fixation in the proximal left femur with alignment at the previous fracture site essentially anatomic. Screw tip in proximal femoral head. There is no appreciable acute fracture or dislocation. There is moderate symmetric narrowing of each hip joint. There is spurring in the pubic symphysis region. No erosion. There is extensive common femoral, superficial femoral, and profunda femoral artery calcification bilaterally. There is degenerative change in the lower lumbar spine. IMPRESSION: Postoperative change proximal left femur. No acute  fracture or dislocation. Symmetric moderate narrowing of each hip joint. Degenerative change in lower lumbar spine. Multiple foci of arterial vascular calcification noted. Electronically Signed   By: Lowella Grip III M.D.   On: 07/13/2020 15:38   DG Hand 2 View Left  Result Date: 07/13/2020 CLINICAL DATA:  Pain following fall EXAM: LEFT HAND - 2 VIEW COMPARISON:  None. FINDINGS: Frontal and lateral views were obtained. No fracture or dislocation. There is osteoarthritic change in all PIP and DIP joints as well as in the first IP joint. No erosive change. There are multiple foci of arterial vascular calcification. IMPRESSION: No acute fracture or dislocation. 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 Foci of vascular calcification noted at several sites. Electronically Signed   By: Lowella Grip III M.D.   On: 07/13/2020 15:40   DG Chest Port 1 View  Result Date: 07/13/2020 CLINICAL DATA:  Pain following fall EXAM: PORTABLE CHEST 1 VIEW COMPARISON:  None FINDINGS: Lungs are clear. Heart is mildly enlarged with pacemaker leads attached to the right atrium and right ventricle. No adenopathy. There is aortic atherosclerosis. No pneumothorax. No bone lesions. IMPRESSION: Cardiomegaly with pacemaker leads attached to right atrium and right ventricle. Lungs clear. No pneumothorax. Aortic Atherosclerosis (ICD10-I70.0). Electronically Signed    By: Lowella Grip III M.D.   On: 07/13/2020 15:39   CT MAXILLOFACIAL WO CONTRAST  Result Date: 07/13/2020 CLINICAL DATA:  Facial trauma, fall EXAM: CT HEAD WITHOUT CONTRAST CT MAXILLOFACIAL WITHOUT CONTRAST CT CERVICAL SPINE WITHOUT CONTRAST TECHNIQUE: Multidetector CT imaging of the head, cervical spine, and maxillofacial structures were performed using the standard protocol without intravenous contrast. Multiplanar CT image reconstructions of the cervical spine and maxillofacial structures were also generated. COMPARISON:  None. FINDINGS: CT HEAD FINDINGS Brain: Patchy and confluent areas of decreased attenuation are noted throughout the deep and periventricular white matter of the cerebral hemispheres bilaterally, compatible with chronic microvascular ischemic disease. No evidence of large-territorial acute infarction. At least four distinct hyperdense foci within the left frontal lobe measuring 6 x 75mm, 6x4 mm, 2x2 mm, 2x70mm (4:18-21). Possible other punctate foci more superiorly (4:24). These appear to be located at the gray-white matter junction. No definite extra-axial collection. No definite pneumocephalus. No mass effect or midline shift. No hydrocephalus. Basilar cisterns are patent. Vascular: No hyperdense vessel. Atherosclerotic calcifications are present within the cavernous internal carotid and vertebral arteries. Skull: No acute fracture or focal lesion. Other: None. CT MAXILLOFACIAL FINDINGS Osseous: Markedly comminuted and 6 mm depressed left orbital floor fracture. Comminuted nasal process of bilateral maxilla fractures (6:88-92). This is noted to extend posteriorly on the left side to include a minimally displaced fracture of the anterior wall of the left maxillary sinus (6:77) as well as comminuted fracture of the medial wall of the left maxillary sinus (9:36). Left frontal sinus fracture (10:43) that is minimally displaced. Likely left ethmoid sinus fracture. No definite lamina papyracea  fracture. No definite crista galli or cribriform plates bilaterally. Sinuses/Orbits: Air-fluid level with hyperdense fluid within the left maxillary sinus. Air-fluid level within the left frontal sinus and ethmoid sinuses. Otherwise the remaining paranasal sinuses and mastoid air cells are clear. Bilateral lens replacement. Otherwise the orbits are unremarkable. Soft tissues: Soft tissue edema the left periorbital region with associated subcutaneus soft tissue emphysema and hematoma formation. Soft tissue edema and hematoma extends inferiorly to the left maxillary subcutaneus soft tissues. CT CERVICAL SPINE FINDINGS Alignment: Grade 1 anterolisthesis of C4 on C5, C6 on C7, C7 on T1. Otherwise normal.  Skull base and vertebrae: Multilevel degenerative changes of the spine worse at the C5-C6 level. No acute fracture. No aggressive appearing focal osseous lesion or focal pathologic process. Soft tissues and spinal canal: No prevertebral fluid or swelling. No visible canal hematoma. Upper chest: Trace biapical pleural/pulmonary scarring. Other: None. IMPRESSION: 1. At least five distinct subcentimeter foci of intraparenchymal hemorrhage within the left frontal lobe. Underlying mass lesions not excluded. 2. No large territorial ischemic infarction identified. 3. Markedly comminuted and 6 mm depressed left orbital floor fracture. Inferior rectus muscle may be involved. Recommend clinical correlation for entrapment. 4. Minimally displaced anterior wall and comminuted medial wall fracture of the left maxillary sinus. Associated hemorrhage within the left maxillary sinus. 5. Minimally displaced fractures of the left frontal sinus. Possible left ethmoid sinus fracture. 6. Comminuted nasal process of the maxilla fractures bilaterally. 7. Left cribriform plate fracture not excluded. No pneumocephalus identified. 8. No acute displaced fracture or traumatic listhesis of the cervical spine. These results were called by telephone at  the time of interpretation on 07/13/2020 at 4:14 pm to provider Dr. Rex Kras, who verbally acknowledged these results. Electronically Signed   By: Iven Finn M.D.   On: 07/13/2020 16:26    Anti-infectives: Anti-infectives (From admission, onward)   None      Assessment/Plan: s/p * No surgery found * Advance diet  Facial fxs. Maxillofacial plans for outpt follow up in a couple weeks. No acute intervention at this time [facility]. Evolving without mass effect per NSU. No more CT unless mental status changes ambulate  LOS: 1 day    Autumn Messing III 07/15/2020

## 2020-07-15 NOTE — Progress Notes (Signed)
Physical Therapy Treatment Patient Details Name: Kara [name] MRN: [id number] DOB: 28-Jun-1922 Today's Date: 07/15/2020    History of Present Illness Pt is a 87 y.o. female  who presents s/p mechanical ground-level fall while walking at facility. Pt with multiple facial fxs, intraparenchymal hemorrhage of the L frontal lobe, L hand pain (x-rays neg), and L hip pain (x-rays neg). PMH significant for  Afib on Eliquis with pacemaker, HTN, DM II, remote uterine cancer, L THA after hip fx.    PT Comments    Focus of session was gait training with the rollator to assess DME needs at d/c. Pt confirmed new plan is for daughter to stay with pt at d/c (flying in tonight) and for HHPT to follow-up. Based on performance today, feel this is a reasonable plan and pt will be safe with daughter present to assist her. Main limiting factor was L posterior hip pain, however overall there were minimal gait deviations noted due to pain. Anticipate pt will progress well with home health therapies. Will continue to follow acutely.    Follow Up Recommendations  Home health PT;Supervision/Assistance - 24 hour     Equipment Recommendations  None recommended by PT    Recommendations for Other Services       Precautions / Restrictions Precautions Precautions: Fall Restrictions Weight Bearing Restrictions: No    Mobility  Bed Mobility Overal bed mobility: Needs Assistance Bed Mobility: Supine to Sit     Supine to sit: Min guard     General bed mobility comments: increased time needed to come to EOB and use of rail. Hands-on support posteriorly at trunk but no assist required.    Transfers Overall transfer level: Needs assistance Equipment used: 4-wheeled walker Transfers: Sit to/from Stand Sit to Stand: Supervision         General transfer comment: No assist required to power-up to full stand. VC's for hand placement on seated surface for safety.  Ambulation/Gait Ambulation/Gait assistance:  Min guard Gait Distance (Feet): 100 Feet Assistive device: 4-wheeled walker Gait Pattern/deviations: Decreased weight shift to left;Step-to pattern;Decreased stance time - left;Narrow base of support;Trunk flexed Gait velocity: Decreased Gait velocity interpretation: <1.31 ft/sec, indicative of household ambulator General Gait Details: Pain limiting distance. Pt with minimal disturbance of gait pattern due to pain but complaining of 8/10 pain with weightbearing on the L hip.   Stairs             Wheelchair Mobility    Modified Rankin (Stroke Patients Only) Modified Rankin (Stroke Patients Only) Pre-Morbid Rankin Score: No symptoms Modified Rankin: Moderately severe disability     Balance Overall balance assessment: Needs assistance;History of Falls Sitting-balance support: Single extremity supported;Feet supported Sitting balance-Leahy Scale: Fair     Standing balance support: Bilateral upper extremity supported Standing balance-Leahy Scale: Poor Standing balance comment: reliant on UE support                            Cognition Arousal/Alertness: Awake/alert Behavior During Therapy: WFL for tasks assessed/performed Overall Cognitive Status: Within Functional Limits for tasks assessed                                        Exercises      General Comments        Pertinent Vitals/Pain Pain Assessment: Faces Faces Pain Scale: Hurts even more Pain Location:  L hip, L hand, face Pain Descriptors / Indicators: Aching;Sore Pain Intervention(s): Limited activity within patient's tolerance;Monitored during session;Repositioned;Ice applied    Home Living                      Prior Function            PT Goals (current goals can now be found in the care plan section) Acute Rehab PT Goals Patient Stated Goal: return to apt PT Goal Formulation: With patient Time For Goal Achievement: 07/28/20 Potential to Achieve Goals:  Good Progress towards PT goals: Progressing toward goals    Frequency    Min 4X/week      PT Plan Discharge plan needs to be updated    Co-evaluation              AM-PAC PT "6 Clicks" Mobility   Outcome Measure  Help needed turning from your back to your side while in a flat bed without using bedrails?: A Little Help needed moving from lying on your back to sitting on the side of a flat bed without using bedrails?: A Little Help needed moving to and from a bed to a chair (including a wheelchair)?: A Little Help needed standing up from a chair using your arms (e.g., wheelchair or bedside chair)?: A Little Help needed to walk in hospital room?: A Little Help needed climbing 3-5 steps with a railing? : A Little 6 Click Score: 18    End of Session Equipment Utilized During Treatment: Gait belt Activity Tolerance: Patient limited by pain Patient left: in chair;with call bell/phone within reach;with chair alarm set Nurse Communication: Mobility status;Other (comment) (blanchable redness at sacrum - foam applied during session) PT Visit Diagnosis: History of falling (Z91.81);Pain;Difficulty in walking, not elsewhere classified (R26.2);Other abnormalities of gait and mobility (R26.89);Unsteadiness on feet (R26.81) Pain - Right/Left: Left Pain - part of body: Hip;Hand     Time: 8110-3159 PT Time Calculation (min) (ACUTE ONLY): 36 min  Charges:  $Gait Training: 23-37 mins                     Kara [name], PT, DPT Acute Rehabilitation Services Pager: [phone] Office: [phone]    Kara [name] 07/15/2020, 12:44 PM

## 2020-07-16 DIAGNOSIS — I619 Nontraumatic intracerebral hemorrhage, unspecified: Secondary | ICD-10-CM | POA: Diagnosis not present

## 2020-07-16 DIAGNOSIS — I482 Chronic atrial fibrillation, unspecified: Secondary | ICD-10-CM | POA: Diagnosis not present

## 2020-07-16 DIAGNOSIS — S0292XA Unspecified fracture of facial bones, initial encounter for closed fracture: Secondary | ICD-10-CM | POA: Diagnosis not present

## 2020-07-16 DIAGNOSIS — Z66 Do not resuscitate: Secondary | ICD-10-CM | POA: Diagnosis not present

## 2020-07-16 LAB — GLUCOSE, CAPILLARY
Glucose-Capillary: 158 mg/dL — ABNORMAL HIGH (ref 70–99)
Glucose-Capillary: 131 mg/dL — ABNORMAL HIGH (ref 70–99)

## 2020-07-16 MED ORDER — ACETAMINOPHEN 325 MG PO TABS: 650.0000 mg | ORAL_TABLET | Freq: Four times a day (QID) | ORAL | 0 refills | Status: DC | PRN

## 2020-07-16 NOTE — Discharge Summary (Signed)
Physician Discharge Summary  Kara [name] IRS:[id number] DOB: [birth year] DOA: 07/13/2020  PCP: Pcp, No  Admit date: 07/13/2020 Discharge date: 07/16/2020  Admitted From: Home  Discharge disposition: Home PT   Recommendations for Outpatient Follow-Up:   . Follow up with your primary care provider in one week.  . Check CBC, BMP, magnesium, phosphorus in the next visit . Follow-up with your Cardiology Dr. Nehemiah Massed in [facility] in [place] to discuss about need for ongoing blood thinners in the future. Patient has been taken off Eliquis after her intracranial hemorrhage.  Neurosurgery recommends at least 1 week off of  Eliquis   Discharge Diagnosis:   Principal Problem:   Intraparenchymal hemorrhage of brain [facility]) Active Problems:   Syncope and collapse   Facial fracture due to fall [facility])   Orbital fracture (HCC)   Hypokalemia   Atrial fibrillation, chronic (HCC)   HTN (hypertension)   Type 2 diabetes mellitus ([place])   DNR (do not resuscitate)   Discharge Condition: Improved.  Diet recommendation: Low sodium, heart healthy.  Carbohydrate-modified.    Wound care: None.  Code status: DNR   History of Present Illness:   Patient is a 87 years old female with past medical history of sick sinus syndrome status post pacemaker, atrial fibrillation on Eliquis, hypertension, diabetes mellitus, hyperlipidemia and history of uterine cancer status post radiation in the past presented to hospital with unwitnessed fall/syncope while using her Rollator.  Patient did not remember the event.  Patient arrivedas atrauma level 2 with GCS score 15 and hemodynamically stable. No hypoglycemia. CT head and CT Maxillofacial revealedat least 5 distinct subcentimeter foci of intraparenchymal hemorrhage in the left frontal lobe. There was comminuted and depressed left orbital floor. Minimally displaced anterior wall and comminuted medial wall fracture of the left maxillary sinus.  Associated hemorrhage within the left maxillary sinus. Minimally displaced fracture of the left frontal sinus and possible left ethmoid sinus fracture. Comminuted nasal fracture maxilla fracture bilaterally.  Patient's last dose of Eliquis was evening of 2/24. She was given K-centra in the ED for reversal and15 mg of labetalol in the ED for hypertension. Her pacemaker was interrogated and reportedly showed noArrhythmia. Trauma surgery, neurosurgery and facial trauma surgeon were consulted by ED physician who recommended admission for monitoring.  Hospital Course:   Following conditions were addressed during hospitalization as listed below,  Syncope/LOC Status post unwitnessed fall and head trauma.  Pacemaker interrogation without any significant arrhythmias.  Physical therapy has seen the patient and recommend skilled nursing facility but patient and the family have opted to go home with home PT.  Multifocal intraparenchymal hemorrhage in the left frontal lobe Up to 5 distinct subcentimeter foci seen on imaging.  Received Kcentra for reversal of Eliquis.  Last dose of Eliquis on 2/24 evening.  Neurosurgery was consulted who did not recommend further imaging.  Patient remained stable without focal neurological deficits.   Left orbital floor fracture/facial fracture minimally displaced medial wall of the left maxillary sinus and frontal sinus possible fracture of left ethmoidal sinus.  Comminuted nasal process of maxilla bilaterally.  Patient has been seen by trauma surgery and ENT.  ENT recommends outpatient follow-up in 2 weeks.  Patient has remained stable at this time.  Hypokalemia Improved with replacement.    Potassium of 4.4  Hx of chronic atrial fibrillation Continue to hold Eliquis.  CHA2DS2-VASc of 5.  Long-term risk versus benefits of anticoagulation to be discussed with cardiology Dr. Nehemiah Massed at [facility] in [place].  Neurosurgery recommends discontinuing Eliquis for at  least 1 week.  Elevated lactate.  Improved with hydration.  No signs of infection.  Hypophosphatemia.    Replenished with Neutra-Phos.  Essential HTN  continue metoprolol succinate .  Blood pressure remained stable  Type 2 diabetes mellitus Hemoglobin A1c of 6.3.    Diet controlled.  Not on any medications.  Disposition.  At this time, patient is stable for disposition to independent living facility.  Spoke with the patient daughter at bedside regarding disposition.  Medical Consultants:    Neurosurgery  ENT  Trauma surgery  Procedures:    None Subjective:   Today, patient was seen and examined at bedside.  Denies any headache, nausea, vomiting, fever or chills.  Patient's daughter at bedside.  Discharge Exam:   Vitals:   07/16/20 0335 07/16/20 0827  BP: (!) 150/79 129/77  Pulse: 71 75  Resp: 18 18  Temp: 98.3 F (36.8 C) 98.2 F (36.8 C)  SpO2: 94% 95%   Vitals:   07/15/20 2208 07/15/20 2328 07/16/20 0335 07/16/20 0827  BP: (!) 118/55 134/65 (!) 150/79 129/77  Pulse:  78 71 75  Resp:  18 18 18   Temp:  98.2 F (36.8 C) 98.3 F (36.8 C) 98.2 F (36.8 C)  TempSrc:  Oral Oral Oral  SpO2:  94% 94% 95%  Weight:      Height:       General: Alert awake, not in obvious distress HENT: pupils equally reacting to light,  No scleral pallor or icterus noted. Oral mucosa is moist.  Left periorbital ecchymosis, small laceration. Chest:  Clear breath sounds.  Diminished breath sounds bilaterally. No crackles or wheezes.  CVS: S1 &S2 heard. No murmur.  Regular rate and rhythm. Abdomen: Soft, nontender, nondistended.  Bowel sounds are heard.   Extremities: No cyanosis, clubbing or edema.  Peripheral pulses are palpable. Psych: Alert, awake and oriented, normal mood CNS:  No cranial nerve deficits.  Power equal in all extremities.   Skin: Warm and dry.  No rashes noted.  The results of significant diagnostics from this hospitalization (including imaging,  microbiology, ancillary and laboratory) are listed below for reference.     Diagnostic Studies:   CT HEAD WO CONTRAST  Result Date: 07/14/2020 CLINICAL DATA:  87 year old female status post fall with posttraumatic shear hemorrhages or hemorrhagic contusions. EXAM: CT HEAD WITHOUT CONTRAST TECHNIQUE: Contiguous axial images were obtained from the base of the skull through the vertex without intravenous contrast. COMPARISON:  Head CT 07/13/2020. FINDINGS: Brain: There are trace bilateral subdural hematomas or less likely posttraumatic subdural hygromas (coronal image 24 and series 3, image 17. These are more apparent since yesterday. Minimal leftward midline shift is stable. Basilar cisterns remain normal. Several small white matter and gray-white matter junction parenchymal hemorrhages in the anterior left frontal lobe are stable since yesterday. Possible small surface hemorrhagic contusion or trace subarachnoid hemorrhage on series 3, image 20. No significant left frontal lobe edema or mass effect. No new parenchymal hemorrhage. Stable gray-white matter differentiation elsewhere. Trace subarachnoid hemorrhages layering in both sylvian fissures. No intraventricular hemorrhage identified. No ventriculomegaly. No cortically based acute infarct identified. Vascular: Extensive Calcified atherosclerosis at the skull base. Skull: Left orbital floor and nondisplaced posterior left maxillary sinus fractures are visible. No calvarium fracture identified. Sinuses/Orbits: Stable hemorrhage in the left maxillary sinus. Stable small volume hemorrhage layering in the left frontal sinus. Mild left ethmoid fluid is stable. Right paranasal sinuses, tympanic cavities and mastoids remain clear. Other: Left  face and scalp contusion/hematoma. Globes and intraorbital soft tissues remain normal. IMPRESSION: 1. There are trace bilateral subdural hematomas, more apparent than yesterday. Minimal associated leftward midline shift is  stable. 2. Several small anterior left frontal lobe shear hemorrhages or hemorrhagic contusions are stable since yesterday. Associated trace subarachnoid hemorrhage not significantly changed. 3. No new intracranial abnormality. 4. Left orbit and maxilla fractures with hemorrhage in the left paranasal sinuses again noted. No calvarium fracture identified. Electronically Signed   By: Genevie Ann M.D.   On: 07/14/2020 07:32   CT HEAD WO CONTRAST  Result Date: 07/13/2020 CLINICAL DATA:  Facial trauma, fall EXAM: CT HEAD WITHOUT CONTRAST CT MAXILLOFACIAL WITHOUT CONTRAST CT CERVICAL SPINE WITHOUT CONTRAST TECHNIQUE: Multidetector CT imaging of the head, cervical spine, and maxillofacial structures were performed using the standard protocol without intravenous contrast. Multiplanar CT image reconstructions of the cervical spine and maxillofacial structures were also generated. COMPARISON:  None. FINDINGS: CT HEAD FINDINGS Brain: Patchy and confluent areas of decreased attenuation are noted throughout the deep and periventricular white matter of the cerebral hemispheres bilaterally, compatible with chronic microvascular ischemic disease. No evidence of large-territorial acute infarction. At least four distinct hyperdense foci within the left frontal lobe measuring 6 x 8mm, 6x4 mm, 2x2 mm, 2x21mm (4:18-21). Possible other punctate foci more superiorly (4:24). These appear to be located at the gray-white matter junction. No definite extra-axial collection. No definite pneumocephalus. No mass effect or midline shift. No hydrocephalus. Basilar cisterns are patent. Vascular: No hyperdense vessel. Atherosclerotic calcifications are present within the cavernous internal carotid and vertebral arteries. Skull: No acute fracture or focal lesion. Other: None. CT MAXILLOFACIAL FINDINGS Osseous: Markedly comminuted and 6 mm depressed left orbital floor fracture. Comminuted nasal process of bilateral maxilla fractures (6:88-92). This is  noted to extend posteriorly on the left side to include a minimally displaced fracture of the anterior wall of the left maxillary sinus (6:77) as well as comminuted fracture of the medial wall of the left maxillary sinus (9:36). Left frontal sinus fracture (10:43) that is minimally displaced. Likely left ethmoid sinus fracture. No definite lamina papyracea fracture. No definite crista galli or cribriform plates bilaterally. Sinuses/Orbits: Air-fluid level with hyperdense fluid within the left maxillary sinus. Air-fluid level within the left frontal sinus and ethmoid sinuses. Otherwise the remaining paranasal sinuses and mastoid air cells are clear. Bilateral lens replacement. Otherwise the orbits are unremarkable. Soft tissues: Soft tissue edema the left periorbital region with associated subcutaneus soft tissue emphysema and hematoma formation. Soft tissue edema and hematoma extends inferiorly to the left maxillary subcutaneus soft tissues. CT CERVICAL SPINE FINDINGS Alignment: Grade 1 anterolisthesis of C4 on C5, C6 on C7, C7 on T1. Otherwise normal. Skull base and vertebrae: Multilevel degenerative changes of the spine worse at the C5-C6 level. No acute fracture. No aggressive appearing focal osseous lesion or focal pathologic process. Soft tissues and spinal canal: No prevertebral fluid or swelling. No visible canal hematoma. Upper chest: Trace biapical pleural/pulmonary scarring. Other: None. IMPRESSION: 1. At least five distinct subcentimeter foci of intraparenchymal hemorrhage within the left frontal lobe. Underlying mass lesions not excluded. 2. No large territorial ischemic infarction identified. 3. Markedly comminuted and 6 mm depressed left orbital floor fracture. Inferior rectus muscle may be involved. Recommend clinical correlation for entrapment. 4. Minimally displaced anterior wall and comminuted medial wall fracture of the left maxillary sinus. Associated hemorrhage within the left maxillary sinus.  5. Minimally displaced fractures of the left frontal sinus. Possible left ethmoid sinus fracture.  6. Comminuted nasal process of the maxilla fractures bilaterally. 7. Left cribriform plate fracture not excluded. No pneumocephalus identified. 8. No acute displaced fracture or traumatic listhesis of the cervical spine. These results were called by telephone at the time of interpretation on 07/13/2020 at 4:14 pm to provider Dr. Rex Kras, who verbally acknowledged these results. Electronically Signed   By: Iven Finn M.D.   On: 07/13/2020 16:26   CT CERVICAL SPINE WO CONTRAST  Result Date: 07/13/2020 CLINICAL DATA:  Facial trauma, fall EXAM: CT HEAD WITHOUT CONTRAST CT MAXILLOFACIAL WITHOUT CONTRAST CT CERVICAL SPINE WITHOUT CONTRAST TECHNIQUE: Multidetector CT imaging of the head, cervical spine, and maxillofacial structures were performed using the standard protocol without intravenous contrast. Multiplanar CT image reconstructions of the cervical spine and maxillofacial structures were also generated. COMPARISON:  None. FINDINGS: CT HEAD FINDINGS Brain: Patchy and confluent areas of decreased attenuation are noted throughout the deep and periventricular white matter of the cerebral hemispheres bilaterally, compatible with chronic microvascular ischemic disease. No evidence of large-territorial acute infarction. At least four distinct hyperdense foci within the left frontal lobe measuring 6 x 50mm, 6x4 mm, 2x2 mm, 2x8mm (4:18-21). Possible other punctate foci more superiorly (4:24). These appear to be located at the gray-white matter junction. No definite extra-axial collection. No definite pneumocephalus. No mass effect or midline shift. No hydrocephalus. Basilar cisterns are patent. Vascular: No hyperdense vessel. Atherosclerotic calcifications are present within the cavernous internal carotid and vertebral arteries. Skull: No acute fracture or focal lesion. Other: None. CT MAXILLOFACIAL FINDINGS Osseous:  Markedly comminuted and 6 mm depressed left orbital floor fracture. Comminuted nasal process of bilateral maxilla fractures (6:88-92). This is noted to extend posteriorly on the left side to include a minimally displaced fracture of the anterior wall of the left maxillary sinus (6:77) as well as comminuted fracture of the medial wall of the left maxillary sinus (9:36). Left frontal sinus fracture (10:43) that is minimally displaced. Likely left ethmoid sinus fracture. No definite lamina papyracea fracture. No definite crista galli or cribriform plates bilaterally. Sinuses/Orbits: Air-fluid level with hyperdense fluid within the left maxillary sinus. Air-fluid level within the left frontal sinus and ethmoid sinuses. Otherwise the remaining paranasal sinuses and mastoid air cells are clear. Bilateral lens replacement. Otherwise the orbits are unremarkable. Soft tissues: Soft tissue edema the left periorbital region with associated subcutaneus soft tissue emphysema and hematoma formation. Soft tissue edema and hematoma extends inferiorly to the left maxillary subcutaneus soft tissues. CT CERVICAL SPINE FINDINGS Alignment: Grade 1 anterolisthesis of C4 on C5, C6 on C7, C7 on T1. Otherwise normal. Skull base and vertebrae: Multilevel degenerative changes of the spine worse at the C5-C6 level. No acute fracture. No aggressive appearing focal osseous lesion or focal pathologic process. Soft tissues and spinal canal: No prevertebral fluid or swelling. No visible canal hematoma. Upper chest: Trace biapical pleural/pulmonary scarring. Other: None. IMPRESSION: 1. At least five distinct subcentimeter foci of intraparenchymal hemorrhage within the left frontal lobe. Underlying mass lesions not excluded. 2. No large territorial ischemic infarction identified. 3. Markedly comminuted and 6 mm depressed left orbital floor fracture. Inferior rectus muscle may be involved. Recommend clinical correlation for entrapment. 4. Minimally  displaced anterior wall and comminuted medial wall fracture of the left maxillary sinus. Associated hemorrhage within the left maxillary sinus. 5. Minimally displaced fractures of the left frontal sinus. Possible left ethmoid sinus fracture. 6. Comminuted nasal process of the maxilla fractures bilaterally. 7. Left cribriform plate fracture not excluded. No pneumocephalus identified. 8. No  acute displaced fracture or traumatic listhesis of the cervical spine. These results were called by telephone at the time of interpretation on 07/13/2020 at 4:14 pm to provider Dr. Rex Kras, who verbally acknowledged these results. Electronically Signed   By: Iven Finn M.D.   On: 07/13/2020 16:26   DG Pelvis Portable  Result Date: 07/13/2020 CLINICAL DATA:  Pain following fall EXAM: PORTABLE PELVIS 1-2 VIEWS COMPARISON:  None. FINDINGS: Frontal pelvis obtained. There is screw and nail fixation in the proximal left femur with alignment at the previous fracture site essentially anatomic. Screw tip in proximal femoral head. There is no appreciable acute fracture or dislocation. There is moderate symmetric narrowing of each hip joint. There is spurring in the pubic symphysis region. No erosion. There is extensive common femoral, superficial femoral, and profunda femoral artery calcification bilaterally. There is degenerative change in the lower lumbar spine. IMPRESSION: Postoperative change proximal left femur. No acute fracture or dislocation. Symmetric moderate narrowing of each hip joint. Degenerative change in lower lumbar spine. Multiple foci of arterial vascular calcification noted. Electronically Signed   By: Lowella Grip III M.D.   On: 07/13/2020 15:38   DG Hand 2 View Left  Result Date: 07/13/2020 CLINICAL DATA:  Pain following fall EXAM: LEFT HAND - 2 VIEW COMPARISON:  None. FINDINGS: Frontal and lateral views were obtained. No fracture or dislocation. There is osteoarthritic change in all PIP and DIP joints  as well as in the first IP joint. No erosive change. There are multiple foci of arterial vascular calcification. IMPRESSION: No acute fracture or dislocation. 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 Foci of vascular calcification noted at several sites. Electronically Signed   By: Lowella Grip III M.D.   On: 07/13/2020 15:40   DG Chest Port 1 View  Result Date: 07/13/2020 CLINICAL DATA:  Pain following fall EXAM: PORTABLE CHEST 1 VIEW COMPARISON:  None FINDINGS: Lungs are clear. Heart is mildly enlarged with pacemaker leads attached to the right atrium and right ventricle. No adenopathy. There is aortic atherosclerosis. No pneumothorax. No bone lesions. IMPRESSION: Cardiomegaly with pacemaker leads attached to right atrium and right ventricle. Lungs clear. No pneumothorax. Aortic Atherosclerosis (ICD10-I70.0). Electronically Signed   By: Lowella Grip III M.D.   On: 07/13/2020 15:39   CT MAXILLOFACIAL WO CONTRAST  Result Date: 07/13/2020 CLINICAL DATA:  Facial trauma, fall EXAM: CT HEAD WITHOUT CONTRAST CT MAXILLOFACIAL WITHOUT CONTRAST CT CERVICAL SPINE WITHOUT CONTRAST TECHNIQUE: Multidetector CT imaging of the head, cervical spine, and maxillofacial structures were performed using the standard protocol without intravenous contrast. Multiplanar CT image reconstructions of the cervical spine and maxillofacial structures were also generated. COMPARISON:  None. FINDINGS: CT HEAD FINDINGS Brain: Patchy and confluent areas of decreased attenuation are noted throughout the deep and periventricular white matter of the cerebral hemispheres bilaterally, compatible with chronic microvascular ischemic disease. No evidence of large-territorial acute infarction. At least four distinct hyperdense foci within the left frontal lobe measuring 6 x 73mm, 6x4 mm, 2x2 mm, 2x77mm (4:18-21). Possible other punctate foci more superiorly (4:24). These appear to be located at the gray-white matter junction. No  definite extra-axial collection. No definite pneumocephalus. No mass effect or midline shift. No hydrocephalus. Basilar cisterns are patent. Vascular: No hyperdense vessel. Atherosclerotic calcifications are present within the cavernous internal carotid and vertebral arteries. Skull: No acute fracture or focal lesion. Other: None. CT MAXILLOFACIAL FINDINGS Osseous: Markedly comminuted and 6 mm depressed left orbital floor fracture. Comminuted nasal process of bilateral maxilla fractures (6:88-92). This  is noted to extend posteriorly on the left side to include a minimally displaced fracture of the anterior wall of the left maxillary sinus (6:77) as well as comminuted fracture of the medial wall of the left maxillary sinus (9:36). Left frontal sinus fracture (10:43) that is minimally displaced. Likely left ethmoid sinus fracture. No definite lamina papyracea fracture. No definite crista galli or cribriform plates bilaterally. Sinuses/Orbits: Air-fluid level with hyperdense fluid within the left maxillary sinus. Air-fluid level within the left frontal sinus and ethmoid sinuses. Otherwise the remaining paranasal sinuses and mastoid air cells are clear. Bilateral lens replacement. Otherwise the orbits are unremarkable. Soft tissues: Soft tissue edema the left periorbital region with associated subcutaneus soft tissue emphysema and hematoma formation. Soft tissue edema and hematoma extends inferiorly to the left maxillary subcutaneus soft tissues. CT CERVICAL SPINE FINDINGS Alignment: Grade 1 anterolisthesis of C4 on C5, C6 on C7, C7 on T1. Otherwise normal. Skull base and vertebrae: Multilevel degenerative changes of the spine worse at the C5-C6 level. No acute fracture. No aggressive appearing focal osseous lesion or focal pathologic process. Soft tissues and spinal canal: No prevertebral fluid or swelling. No visible canal hematoma. Upper chest: Trace biapical pleural/pulmonary scarring. Other: None. IMPRESSION: 1. At  least five distinct subcentimeter foci of intraparenchymal hemorrhage within the left frontal lobe. Underlying mass lesions not excluded. 2. No large territorial ischemic infarction identified. 3. Markedly comminuted and 6 mm depressed left orbital floor fracture. Inferior rectus muscle may be involved. Recommend clinical correlation for entrapment. 4. Minimally displaced anterior wall and comminuted medial wall fracture of the left maxillary sinus. Associated hemorrhage within the left maxillary sinus. 5. Minimally displaced fractures of the left frontal sinus. Possible left ethmoid sinus fracture. 6. Comminuted nasal process of the maxilla fractures bilaterally. 7. Left cribriform plate fracture not excluded. No pneumocephalus identified. 8. No acute displaced fracture or traumatic listhesis of the cervical spine. These results were called by telephone at the time of interpretation on 07/13/2020 at 4:14 pm to provider Dr. Rex Kras, who verbally acknowledged these results. Electronically Signed   By: Iven Finn M.D.   On: 07/13/2020 16:26     Labs:   Basic Metabolic Panel: Recent Labs  Lab 07/13/20 1454 07/13/20 1509 07/13/20 1513 07/14/20 0317 07/15/20 0232  NA 141 142  --  137 140  K 3.0* 2.9*  --  3.0* 4.4  CL 104 103  --  101 104  CO2 24  --   --  26 28  GLUCOSE 236* 227*  --  129* 150*  BUN 22 24*  --  17 14  CREATININE 0.96 0.80  --  0.86 0.77  CALCIUM 8.7*  --   --  8.2* 8.5*  MG  --   --  1.8  --  2.1  PHOS  --   --   --   --  2.0*   GFR Estimated Creatinine Clearance: 33.3 mL/min (by C-G formula based on SCr of 0.77 mg/dL). Liver Function Tests: Recent Labs  Lab 07/13/20 1454  AST 24  ALT 19  ALKPHOS 50  BILITOT 1.7*  PROT 6.5  ALBUMIN 3.6   No results for input(s): LIPASE, AMYLASE in the last 168 hours. No results for input(s): AMMONIA in the last 168 hours. Coagulation profile Recent Labs  Lab 07/13/20 1454  INR 1.1    CBC: Recent Labs  Lab  07/13/20 1454 07/13/20 1509 07/14/20 0317 07/15/20 0232  WBC 7.5  --  8.6 7.9  HGB 14.2  15.0 12.2 13.5  HCT 43.0 44.0 35.1* 40.7  MCV 95.3  --  92.1 93.8  PLT 152  --  138* 148*   Cardiac Enzymes: No results for input(s): CKTOTAL, CKMB, CKMBINDEX, TROPONINI in the last 168 hours. BNP: Invalid input(s): POCBNP CBG: Recent Labs  Lab 07/15/20 0612 07/15/20 1220 07/15/20 1540 07/15/20 2133 07/16/20 0603  GLUCAP 126* 206* 141* 153* 158*   D-Dimer No results for input(s): DDIMER in the last 72 hours. Hgb A1c Recent Labs    07/14/20 0317  HGBA1C 6.3*   Lipid Profile No results for input(s): CHOL, HDL, LDLCALC, TRIG, CHOLHDL, LDLDIRECT in the last 72 hours. Thyroid function studies No results for input(s): TSH, T4TOTAL, T3FREE, THYROIDAB in the last 72 hours.  Invalid input(s): FREET3 Anemia work up No results for input(s): VITAMINB12, FOLATE, FERRITIN, TIBC, IRON, RETICCTPCT in the last 72 hours. Microbiology Recent Results (from the past 240 hour(s))  Resp Panel by RT-PCR (Flu A&B, Covid) Nasopharyngeal Swab     Status: None   Collection Time: 07/13/20  2:54 PM   Specimen: Nasopharyngeal Swab; Nasopharyngeal(NP) swabs in vial transport medium  Result Value Ref Range Status   SARS Coronavirus 2 by RT PCR NEGATIVE NEGATIVE Final    Comment: (NOTE) SARS-CoV-2 target nucleic acids are NOT DETECTED.  The SARS-CoV-2 RNA is generally detectable in upper respiratory specimens during the acute phase of infection. The lowest concentration of SARS-CoV-2 viral copies this assay can detect is 138 copies/mL. A negative result does not preclude SARS-Cov-2 infection and should not be used as the sole basis for treatment or other patient management decisions. A negative result may occur with  improper specimen collection/handling, submission of specimen other than nasopharyngeal swab, presence of viral mutation(s) within the areas targeted by this assay, and inadequate number of  viral copies(<138 copies/mL). A negative result must be combined with clinical observations, patient history, and epidemiological information. The expected result is Negative.  Fact Sheet for Patients:  EntrepreneurPulse.com.au  Fact Sheet for Healthcare Providers:  IncredibleEmployment.be  This test is no t yet approved or cleared by the [place] FDA and  has been authorized for detection and/or diagnosis of SARS-CoV-2 by FDA under an Emergency Use Authorization (EUA). This EUA will remain  in effect (meaning this test can be used) for the duration of the COVID-19 declaration under Section 564(b)(1) of the Act, 21 U.S.C.section 360bbb-3(b)(1), unless the authorization is terminated  or revoked sooner.       Influenza A by PCR NEGATIVE NEGATIVE Final   Influenza B by PCR NEGATIVE NEGATIVE Final    Comment: (NOTE) The Xpert Xpress SARS-CoV-2/FLU/RSV plus assay is intended as an aid in the diagnosis of influenza from Nasopharyngeal swab specimens and should not be used as a sole basis for treatment. Nasal washings and aspirates are unacceptable for Xpert Xpress SARS-CoV-2/FLU/RSV testing.  Fact Sheet for Patients: EntrepreneurPulse.com.au  Fact Sheet for Healthcare Providers: IncredibleEmployment.be  This test is not yet approved or cleared by the [place] FDA and has been authorized for detection and/or diagnosis of SARS-CoV-2 by FDA under an Emergency Use Authorization (EUA). This EUA will remain in effect (meaning this test can be used) for the duration of the COVID-19 declaration under Section 564(b)(1) of the Act, 21 U.S.C. section 360bbb-3(b)(1), unless the authorization is terminated or revoked.  Performed at [facility], [address], [place], [place] [address]      Discharge Instructions:   Discharge Instructions    Call MD for:  severe uncontrolled pain   Complete by:  As directed    Call MD for:  temperature >100.4   Complete by: As directed    Diet - low sodium heart healthy   Complete by: As directed    Discharge instructions   Complete by: As directed    Follow-up with your primary care physician in 1 week.  Follow-up with ENT Dr Glenford Peers in 2 weeks for follow-up of facial fractures..  Seek medical attention for worsening symptoms including headache, vomiting, confusion, focal weakness in the body.  Follow-up with your cardiology doctor in 1-2 weeks to discuss about blood thinners, do not take blood thinner until then,   Increase activity slowly   Complete by: As directed      Allergies as of 07/16/2020   No Known Allergies     Medication List    STOP taking these medications   CINNAMON PO   COQ10 PO   CRANBERRY PO   Eliquis 2.5 MG Tabs tablet Generic drug: apixaban   omega-3 acid ethyl esters 1 g capsule Commonly known as: LOVAZA     TAKE these medications   acetaminophen 325 MG tablet Commonly known as: TYLENOL Take 2 tablets (650 mg total) by mouth every 6 (six) hours as needed for mild pain or moderate pain.   diphenoxylate-atropine 2.5-0.025 MG tablet Commonly known as: LOMOTIL Take 1 tablet by mouth 4 (four) times daily as needed for diarrhea or loose stools.   metoprolol succinate 25 MG 24 hr tablet Commonly known as: TOPROL-XL Take 25 mg by mouth in the morning and at bedtime.            Durable Medical Equipment  (From admission, onward)         Start     Ordered   07/16/20 1036  For home use only DME Walker rolling  Once       Question Answer Comment  Walker: With 5 Inch Wheels   Patient needs a walker to treat with the following condition Intraparenchymal hemorrhage of brain ([place])      07/16/20 1035          Follow-up [place]. Schedule an appointment as soon as possible for a visit in 2 week(s).   Why: follow up of facial fractures, call [phone]  Contact  information: [place], [place] [address]        Primary care provider. Schedule an appointment as soon as possible for a visit in 1 week(s).   Why: regular check up               Time coordinating discharge: 39 minutes  Signed:  Amar Sippel  [facility] 07/16/2020, 10:51 AM

## 2020-07-16 NOTE — Progress Notes (Signed)
Physical Therapy Treatment Patient Details Name: Kara [name] MRN: [id number] DOB: 06/21/1922 Today's Date: 07/16/2020    History of Present Illness Pt is a 87 y.o. female  who presents s/p mechanical ground-level fall while walking at facility. Pt with multiple facial fxs, intraparenchymal hemorrhage of the L frontal lobe, L hand pain (x-rays neg), and L hip pain (x-rays neg). PMH significant for  Afib on Eliquis with pacemaker, HTN, DM II, remote uterine cancer, L THA after hip fx.    PT Comments    Patient progressing well towards PT goals. Reports pain is less today in left hip however still present. Tolerated ADL session at sink with close min guard and use of RW for support and gait training with Min guard as well. Distance limited due to hip pain and reported stiffness. VSS on RA with activity. Pt will have support of daughter at home who flew out from [place] last night. Requires more assist for transfers today (Min A) but likely due to first time up this morning. Will continue to follow and progress as tolerated.    Follow Up Recommendations  Home health PT;Supervision/Assistance - 24 hour     Equipment Recommendations  None recommended by PT    Recommendations for Other Services       Precautions / Restrictions Precautions Precautions: Fall Restrictions Weight Bearing Restrictions: No    Mobility  Bed Mobility Overal bed mobility: Needs Assistance Bed Mobility: Sidelying to Sit;Rolling Rolling: Supervision Sidelying to sit: Min assist;HOB elevated       General bed mobility comments: Increased time and assist to elevate trunk to get to EOB as well as scoot bottom to EOB. Reports feeling stiff this AM.    Transfers Overall transfer level: Needs assistance Equipment used: Rolling walker (2 wheeled) Transfers: Sit to/from Stand Sit to Stand: Min assist         General transfer comment: Min A to stand from EOB x1, from toilet x1, transferred to chair post  ambulation. Cues for hand placement/technique.  Ambulation/Gait Ambulation/Gait assistance: Min guard Gait Distance (Feet): 75 Feet Assistive device: Rolling walker (2 wheeled) Gait Pattern/deviations: Decreased weight shift to left;Step-to pattern;Decreased stance time - left;Narrow base of support;Trunk flexed;Step-through pattern Gait velocity: Decreased Gait velocity interpretation: <1.31 ft/sec, indicative of household ambulator General Gait Details: Slow, mildly unsteady and antalgic like gait due to left hip pain (reports better than yesterday), limiting distance.   Stairs             Wheelchair Mobility    Modified Rankin (Stroke Patients Only) Modified Rankin (Stroke Patients Only) Pre-Morbid Rankin Score: No symptoms Modified Rankin: Moderately severe disability     Balance Overall balance assessment: Needs assistance;History of Falls Sitting-balance support: Feet supported;No upper extremity supported Sitting balance-Leahy Scale: Fair Sitting balance - Comments: supervision for safety.   Standing balance support: During functional activity Standing balance-Leahy Scale: Fair Standing balance comment: Able to stand at sink and perform ADLs with close min gaurd for safety.                            Cognition Arousal/Alertness: Awake/alert Behavior During Therapy: WFL for tasks assessed/performed Overall Cognitive Status: Within Functional Limits for tasks assessed                                        Exercises  General Comments General comments (skin integrity, edema, etc.): VSS on RA.      Pertinent Vitals/Pain Pain Assessment: Faces Faces Pain Scale: Hurts little more Pain Location: left hip wtih mobility Pain Descriptors / Indicators: Aching;Sore Pain Intervention(s): Monitored during session;Repositioned;Limited activity within patient's tolerance    Home Living                      Prior Function             PT Goals (current goals can now be found in the care plan section) Progress towards PT goals: Progressing toward goals    Frequency    Min 4X/week      PT Plan Current plan remains appropriate    Co-evaluation              AM-PAC PT "6 Clicks" Mobility   Outcome Measure  Help needed turning from your back to your side while in a flat bed without using bedrails?: A Little Help needed moving from lying on your back to sitting on the side of a flat bed without using bedrails?: A Little Help needed moving to and from a bed to a chair (including a wheelchair)?: A Little Help needed standing up from a chair using your arms (e.g., wheelchair or bedside chair)?: A Little Help needed to walk in hospital room?: A Little Help needed climbing 3-5 steps with a railing? : A Little 6 Click Score: 18    End of Session Equipment Utilized During Treatment: Gait belt Activity Tolerance: Patient tolerated treatment well;Patient limited by pain Patient left: in chair;with call bell/phone within reach;with chair alarm set Nurse Communication: Mobility status PT Visit Diagnosis: History of falling (Z91.81);Pain;Difficulty in walking, not elsewhere classified (R26.2);Other abnormalities of gait and mobility (R26.89);Unsteadiness on feet (R26.81) Pain - Right/Left: Left Pain - part of body: Hip     Time: 9458-5929 PT Time Calculation (min) (ACUTE ONLY): 26 min  Charges:  $Gait Training: 8-22 mins $Therapeutic Activity: 8-22 mins                     Marisa Severin, PT, DPT Acute Rehabilitation Services Pager [phone] Office [place] 07/16/2020, 10:22 AM

## 2020-07-16 NOTE — TOC Transition Note (Signed)
Transition of Care [facility]) - CM/SW Discharge Note   Patient Details  Name: Kara [name] MRN: [id number] Date of Birth: 1923/04/22  [facility]) CM/SW Contact:  Geralynn Ochs, LCSW Phone Number: 07/16/2020, 11:04 AM   Clinical Narrative:   CSW notified [facility] that patient is returning today, faxed home health orders for them to set up. No other needs at this time.    Final next level of care: [place] Barriers to Discharge: Barriers Resolved   Patient Goals and CMS Choice Patient states their goals for this hospitalization and ongoing recovery are:: to get back home CMS Medicare.gov Compare Post Acute Care list provided to:: Patient Choice offered to / list presented to : Patient  Discharge Placement              Patient chooses bed at: [facility] Patient to be transferred to facility by: Family car Name of family member notified: Self Patient and family notified of of transfer: 07/16/20  Discharge Plan and Services     Post Acute Care Choice: [place]: PT,OT [place] Agency: Other - See comment        Social Determinants of Health (SDOH) Interventions     Readmission Risk Interventions No flowsheet data found.

## 2020-07-16 NOTE — Progress Notes (Signed)
Subjective: C/o pain at her buttock where she has had a "nodule" for a long time.  C/o pain on her left hip and left hand, although she has mobilized and is using her hand well.  Eating well. C/o some trouble focusing with vision today. Hoping to go home today.  ROS: See above, otherwise other systems negative  Objective: Vital signs in last 24 hours: Temp:  [97.8 F (36.6 C)-98.3 F (36.8 C)] 98.2 F (36.8 C) (02/28 0827) Pulse Rate:  [70-81] 75 (02/28 0827) Resp:  [18-20] 18 (02/28 0827) BP: (111-150)/(48-79) 129/77 (02/28 0827) SpO2:  [94 %-96 %] 95 % (02/28 0827) Last BM Date: 07/14/20  Intake/Output from previous day: 02/27 0701 - 02/28 0700 In: 660 [P.O.:660] Out: 453 [Urine:453] Intake/Output this shift: Total I/O In: 240 [P.O.:240] Out: -   PE: Gen: sitting up in her chair in NAD HEENT: PERRL, left periorbital ecchymosis, dressing in place and removed, laceration stable Heart: irregular Lungs: CTAB Abd: soft, NT, ND Skin: no pilonidal cyst noted.  Patient surprised that nodule not present, even to her palpation.  No evidence of infection or erythema.  No drainage Ext: tender to touch at later aspect of left hip, but stands well on her own.  Left hand with some ecchymosis but has normal ROM. Neuro: NVI, sensation normal throughout Psych: A&Ox3  Lab Results:  Recent Labs    07/14/20 0317 07/15/20 0232  WBC 8.6 7.9  HGB 12.2 13.5  HCT 35.1* 40.7  PLT 138* 148*   BMET Recent Labs    07/14/20 0317 07/15/20 0232  NA 137 140  K 3.0* 4.4  CL 101 104  CO2 26 28  GLUCOSE 129* 150*  BUN 17 14  CREATININE 0.86 0.77  CALCIUM 8.2* 8.5*   PT/INR Recent Labs    07/13/20 1454  LABPROT 13.9  INR 1.1   CMP     Component Value Date/Time   NA 140 07/15/2020 0232   K 4.4 07/15/2020 0232   CL 104 07/15/2020 0232   CO2 28 07/15/2020 0232   GLUCOSE 150 (H) 07/15/2020 0232   BUN 14 07/15/2020 0232   CREATININE 0.77 07/15/2020 0232   CALCIUM 8.5  (L) 07/15/2020 0232   PROT 6.5 07/13/2020 1454   ALBUMIN 3.6 07/13/2020 1454   AST 24 07/13/2020 1454   ALT 19 07/13/2020 1454   ALKPHOS 50 07/13/2020 1454   BILITOT 1.7 (H) 07/13/2020 1454   GFRNONAA >60 07/15/2020 0232   Lipase  No results found for: LIPASE     Studies/Results: No results found.  Anti-infectives: Anti-infectives (From admission, onward)   None       Assessment/Plan  Fall A fib on eliquis HTN HLD DM GERD Left frontal lobe intraparenchymal hemorrhage - Dr. Ellene Route evaluated patient no further work up, just observation needed.  Remained stable Multiple facial fx -   L orbital floor fracture (?inferior rectus muscle involvement) - L maxillary sinus fracture - L frontal sinus fracture - Comminuted nasal process of the maxilla fractures bilaterally. - ?L esthmoid sinus fracture -follow up in 2 weeks with Dr. Glenford Peers   FEN - HH d iet VTE - on hold due to above ID - none Dispo - stable from a trauma standpoint for DC back to facility.  Please call if we can be of further help.   LOS: 2 days    Henreitta Cea , [facility] Surgery 07/16/2020, 9:54 AM Please see Amion for pager number  during day hours 7:00am-4:30pm or 7:00am -11:30am on weekends

## 2020-07-17 ENCOUNTER — Encounter: Payer: Self-pay | Admitting: Internal Medicine

## 2020-07-17 DIAGNOSIS — S0282XD Fracture of other specified skull and facial bones, left side, subsequent encounter for fracture with routine healing: Secondary | ICD-10-CM | POA: Diagnosis not present

## 2020-07-17 DIAGNOSIS — R2689 Other abnormalities of gait and mobility: Secondary | ICD-10-CM | POA: Diagnosis not present

## 2020-07-17 DIAGNOSIS — M6281 Muscle weakness (generalized): Secondary | ICD-10-CM | POA: Diagnosis not present

## 2020-07-17 DIAGNOSIS — Z9181 History of falling: Secondary | ICD-10-CM | POA: Diagnosis not present

## 2020-07-17 DIAGNOSIS — S0292XD Unspecified fracture of facial bones, subsequent encounter for fracture with routine healing: Secondary | ICD-10-CM | POA: Diagnosis not present

## 2020-07-17 DIAGNOSIS — M25542 Pain in joints of left hand: Secondary | ICD-10-CM | POA: Diagnosis not present

## 2020-07-17 DIAGNOSIS — S0282XS Fracture of other specified skull and facial bones, left side, sequela: Secondary | ICD-10-CM | POA: Diagnosis not present

## 2020-07-17 DIAGNOSIS — R262 Difficulty in walking, not elsewhere classified: Secondary | ICD-10-CM | POA: Diagnosis not present

## 2020-07-18 ENCOUNTER — Telehealth: Payer: Self-pay

## 2020-07-18 NOTE — Telephone Encounter (Signed)
Transition Care Management Follow-up Telephone Call  Date of discharge and from where: 07/16/20 from [facility].  How have you been since you were released from the hospital? Patient states,"As long as I am still, I feel good. I am now taking 2 tylenol every 6 hours to stay ahead of the pain. Therapy starts tomorrow." Denies n/v/d, fever, severe uncontrolled pain, confusion, dizziness, headache and all other symptoms.   Any questions or concerns? No  Items Reviewed:  Did the pt receive and understand the discharge instructions provided? Yes, increase activity slowly.   Medications obtained and verified? Yes , holding blood thinner until appointment and taking all other medications as directed.   Any new allergies since your discharge? Yes    Dietary orders reviewed? Low sodium/heart healthy, carb modified.  Do you have support at home? Yes , daughter assist.  Home Care and Equipment/Supplies: Were home health services ordered? Yes If so, what is the name of the agency? [facility]. PT/OT Has the agency set up a time to come to the patient's home? Consult complete. Home visits twice weekly.   Functional Questionnaire: (I = Independent and D = Dependent) ADLs: I  Bathing/Dressing- I  Meal Prep- Daughter assist  Eating- I  Maintaining continence- I  Transferring/Ambulation- rollator walker  Managing Meds- I  Follow up appointments reviewed:   PCP Hospital f/u appt confirmed? Yes  Scheduled to see Dr. Derrel Nip on 07/20/20 @ 11:00.  [facility] f/u appt confirmed? Yes  Scheduled to see ENT 07/26/20, Cardiology 07/24/20.  Are transportation arrangements needed? No   If their condition worsens, is the pt aware to call PCP or go to the Emergency Dept.? Yes  Was the patient provided with contact information for the PCP's office or ED? Yes  Was to pt encouraged to call back with questions or concerns? Yes

## 2020-07-19 DIAGNOSIS — S0292XD Unspecified fracture of facial bones, subsequent encounter for fracture with routine healing: Secondary | ICD-10-CM | POA: Diagnosis not present

## 2020-07-19 DIAGNOSIS — Z9181 History of falling: Secondary | ICD-10-CM | POA: Diagnosis not present

## 2020-07-19 DIAGNOSIS — M6281 Muscle weakness (generalized): Secondary | ICD-10-CM | POA: Diagnosis not present

## 2020-07-19 DIAGNOSIS — S0282XD Fracture of other specified skull and facial bones, left side, subsequent encounter for fracture with routine healing: Secondary | ICD-10-CM | POA: Diagnosis not present

## 2020-07-19 DIAGNOSIS — M25542 Pain in joints of left hand: Secondary | ICD-10-CM | POA: Diagnosis not present

## 2020-07-19 DIAGNOSIS — S0282XS Fracture of other specified skull and facial bones, left side, sequela: Secondary | ICD-10-CM | POA: Diagnosis not present

## 2020-07-20 ENCOUNTER — Ambulatory Visit (INDEPENDENT_AMBULATORY_CARE_PROVIDER_SITE_OTHER): Payer: Medicare Other | Admitting: Internal Medicine

## 2020-07-20 ENCOUNTER — Telehealth: Payer: Self-pay

## 2020-07-20 ENCOUNTER — Other Ambulatory Visit: Payer: Self-pay

## 2020-07-20 ENCOUNTER — Encounter: Payer: Self-pay | Admitting: Internal Medicine

## 2020-07-20 VITALS — BP 128/78 | HR 84 | Temp 97.3°F | Ht 62.0 in | Wt 127.0 lb

## 2020-07-20 DIAGNOSIS — S0285XA Fracture of orbit, unspecified, initial encounter for closed fracture: Secondary | ICD-10-CM

## 2020-07-20 DIAGNOSIS — Z09 Encounter for follow-up examination after completed treatment for conditions other than malignant neoplasm: Secondary | ICD-10-CM

## 2020-07-20 DIAGNOSIS — Z79899 Other long term (current) drug therapy: Secondary | ICD-10-CM | POA: Diagnosis not present

## 2020-07-20 DIAGNOSIS — Z9181 History of falling: Secondary | ICD-10-CM | POA: Diagnosis not present

## 2020-07-20 DIAGNOSIS — S06300D Unspecified focal traumatic brain injury without loss of consciousness, subsequent encounter: Secondary | ICD-10-CM

## 2020-07-20 DIAGNOSIS — K529 Noninfective gastroenteritis and colitis, unspecified: Secondary | ICD-10-CM

## 2020-07-20 DIAGNOSIS — I619 Nontraumatic intracerebral hemorrhage, unspecified: Secondary | ICD-10-CM

## 2020-07-20 LAB — BASIC METABOLIC PANEL
BUN: 23 mg/dL (ref 6–23)
CO2: 28 mEq/L (ref 19–32)
Calcium: 8.9 mg/dL (ref 8.4–10.5)
Chloride: 100 mEq/L (ref 96–112)
Creatinine, Ser: 0.76 mg/dL (ref 0.40–1.20)
GFR: 66.42 mL/min (ref >60.00)
Glucose, Bld: 148 mg/dL — ABNORMAL HIGH (ref 70–99)
Potassium: 3.9 mEq/L (ref 3.5–5.1)
Sodium: 137 mEq/L (ref 135–145)

## 2020-07-20 LAB — HEPATIC FUNCTION PANEL
ALT: 10 U/L (ref 0–35)
AST: 14 U/L (ref 0–37)
Albumin: 3.5 g/dL (ref 3.5–5.2)
Alkaline Phosphatase: 63 U/L (ref 39–117)
Bilirubin, Direct: 0.2 mg/dL (ref 0.0–0.3)
Total Bilirubin: 1 mg/dL (ref 0.2–1.2)
Total Protein: 6.1 g/dL (ref 6.0–8.3)

## 2020-07-20 LAB — CBC WITH DIFFERENTIAL/PLATELET
Basophils Absolute: 0 10*3/uL (ref 0.0–0.1)
Basophils Relative: 0.3 % (ref 0.0–3.0)
Eosinophils Absolute: 0.1 10*3/uL (ref 0.0–0.7)
Eosinophils Relative: 2 % (ref 0.0–5.0)
HCT: 39.7 % (ref 36.0–46.0)
Hemoglobin: 13.2 g/dL (ref 12.0–15.0)
Lymphocytes Relative: 27.9 % (ref 12.0–46.0)
Lymphs Abs: 1.9 10*3/uL (ref 0.7–4.0)
MCHC: 33.3 g/dL (ref 30.0–36.0)
MCV: 93.2 fl (ref 78.0–100.0)
Monocytes Absolute: 0.5 10*3/uL (ref 0.1–1.0)
Monocytes Relative: 7.3 % (ref 3.0–12.0)
Neutro Abs: 4.2 10*3/uL (ref 1.4–7.7)
Neutrophils Relative %: 62.5 % (ref 43.0–77.0)
Platelets: 193 10*3/uL (ref 150.0–400.0)
RBC: 4.26 Mil/uL (ref 3.87–5.11)
RDW: 13.2 % (ref 11.5–15.5)
WBC: 6.6 10*3/uL (ref 4.0–10.5)

## 2020-07-20 LAB — PHOSPHORUS: Phosphorus: 3.5 mg/dL (ref 2.3–4.6)

## 2020-07-20 LAB — MAGNESIUM: Magnesium: 1.8 mg/dL (ref 1.5–2.5)

## 2020-07-20 MED ORDER — TRAMADOL HCL 50 MG PO TABS: 50.0000 mg | ORAL_TABLET | Freq: Three times a day (TID) | ORAL | 0 refills | Status: AC | PRN

## 2020-07-20 MED ORDER — DERMACLOUD EX OINT: 1.0000 "application " | TOPICAL_OINTMENT | CUTANEOUS | 1 refills | Status: DC | PRN

## 2020-07-20 NOTE — Patient Instructions (Addendum)
Apply zinc  To buttock followed by a layerof dermacloud ointment to protect from urine contamination  Do not resume fish oil, cinnamon, Or co q 10.  NO MORE ELIQUIS   After 2 full weeks , reduce tylenol to 2000 mg daily in divided doses   Adding tramadol for additional pain management.  Start with 1/2 tablet every 6 hours as needed for pain  Use for daytime

## 2020-07-20 NOTE — Progress Notes (Signed)
Subjective:  Patient ID: Kara [name], female    DOB: 1923-01-01  Age: 87 y.o. MRN: [id number]  CC: The primary encounter diagnosis was Chronic diarrhea. Diagnoses of Long-term use of high-risk medication, History of recent fall, Intracranial hemorrhage after injury without loss of consciousness, subsequent encounter, Closed fracture of orbit, initial encounter [facility]), Intraparenchymal hemorrhage of brain [facility]), and Hospital discharge follow-up were also pertinent to this visit.  HPI AZAIAH MELLO presents for [place]   This visit occurred during the SARS-CoV-2 public health emergency.  Safety protocols were in place, including screening questions prior to the visit, additional usage of staff PPE, and extensive cleaning of exam room while observing appropriate contact time as indicated for disinfecting solutions.   87 YR OLD FEMALE who lives independently at [facility], history of atrial fibrillation on chronic anticoagulation with Eliquis ,  admitted to [facility] on 2/25 after sustaining multiple facial bone fractures and an intracranial hemorrhage, all occurred  during a witnessed  fall at [place] when she stepped away from her walker .  She suffered Minimally displaced fractures of the  Left maxillary sinus medial wall,  Frontal sinus  And Left ethmoidal . Was seen by neurosurgery and ENT during hospitalization with follow up next week.  Has cardiology next week as well. Eliquis reversal was done during admission .  She was discharged home on Feb 28 with home PT ordered.   Pain management using  2 ES tylenol tid (3000 mg daily) still in pain during day but not at night with rest .  Some blood streaked nasal discharge continues.   Vision blurred since the injury, but has been using an old pair of glasses  Since fall , different prescription.   No double vision.   Marland Kitchen  Has not scheduled eye doctor yet    Fields of vsion intact on exam today and PERRL/EOM   Outpatient Medications Prior to Visit   Medication Sig Dispense Refill  . acetaminophen (TYLENOL) 325 MG tablet Take 2 tablets (650 mg total) by mouth every 6 (six) hours as needed for mild pain or moderate pain (pain score 1-3 or temp > 100.5).    Marland Kitchen acetaminophen (TYLENOL) 325 MG tablet Take 2 tablets (650 mg total) by mouth every 6 (six) hours as needed for mild pain or moderate pain. 60 tablet 0  . cholecalciferol (VITAMIN D) 1000 UNITS tablet Take 1,000 Units by mouth daily.    . clobetasol cream (TEMOVATE) 0.05 % Apply topically.    . Cranberry 200 MG CAPS Take 1 capsule (200 mg total) by mouth daily. 30 capsule 2  . cyanocobalamin (,VITAMIN B-12,) 1000 MCG/ML injection INJECT 1ML INTO THE MUSCLE EVERY 14 DAYS 10 mL 0  . diphenoxylate-atropine (LOMOTIL) 2.5-0.025 MG tablet TAKE 1 TABLET BY MOUTH THREE TIMES DAILYAS NEEDED FOR DIARRHEA 90 tablet 5  . diphenoxylate-atropine (LOMOTIL) 2.5-0.025 MG tablet Take 1 tablet by mouth 4 (four) times daily as needed for diarrhea or loose stools.    Marland Kitchen estradiol (ESTRACE) 0.1 MG/GM vaginal cream Place vaginally.    . metoprolol succinate (TOPROL-XL) 25 MG 24 hr tablet Take 1 tablet (25 mg total) by mouth in the morning and at bedtime. 180 tablet 1  . metoprolol succinate (TOPROL-XL) 25 MG 24 hr tablet Take 25 mg by mouth in the morning and at bedtime.    . triamcinolone (KENALOG) 0.1 % APPLY 1 APPLICATION TOPICALLY 2 TIMES DAILY TO VULVA UNTIL ITCHING RESOLVES THEN REDUCE USE TO TWICE WEEKLY. 15  g 1  . apixaban (ELIQUIS) 2.5 MG TABS tablet Take 2.5 mg by mouth 2 (two) times daily. (Patient not taking: Reported on 07/20/2020)     No facility-administered medications prior to visit.    Review of Systems;  Patient denies headache, fevers, malaise, unintentional weight loss, skin rash, eye pain, sinus congestion and sinus pain, sore throat, dysphagia,  hemoptysis , cough, dyspnea, wheezing, chest pain, palpitations, orthopnea, edema, abdominal pain, nausea, melena, diarrhea, constipation, flank  pain, dysuria, hematuria, urinary  Frequency, nocturia, numbness, tingling, seizures,  Focal weakness, Loss of consciousness,  Tremor, insomnia, depression, anxiety, and suicidal ideation.      Objective:  BP 128/78   Pulse 84   Temp (!) 97.3 F (36.3 C)   Ht 5\' 2"  (1.575 m)   Wt 127 lb (57.6 kg)   SpO2 99%   BMI 23.23 kg/m   BP Readings from Last 3 Encounters:  07/20/20 128/78  07/16/20 (!) 148/71  07/02/20 (!) 144/92    Wt Readings from Last 3 Encounters:  07/20/20 127 lb (57.6 kg)  07/13/20 123 lb 10.9 oz (56.1 kg)  07/02/20 126 lb 12.8 oz (57.5 kg)    General appearance: alert, cooperative and appears stated age.   Head:  Old bruises noted to left zygoma  Ears: normal TM's and external ear canals both ears Throat: lips, mucosa, and tongue normal; teeth and gums normal Neck: no adenopathy, no carotid bruit, supple, symmetrical, trachea midline and thyroid not enlarged, symmetric, no tenderness/mass/nodules Back: symmetric, no curvature. ROM normal. No CVA tenderness. Lungs: clear to auscultation bilaterally Heart: regular rate and rhythm, S1, S2 normal, no murmur, click, rub or gallop Abdomen: soft, non-tender; bowel sounds normal; no masses,  no organomegaly Pulses: 2+ and symmetric Skin: Skin color, texture, turgor normal. No rashes or lesions Lymph nodes: Cervical, supraclavicular, and axillary nodes normal. Neuro: CNs 2-12 intact. Fields of vision intact  .  PERRL/ EOMI.  DTRs 2+/4 in biceps, brachioradialis, patellars and achilles. Muscle strength 5/5 in upper and lower exremities. Fine resting tremor bilaterally both hands cerebellar function normal. Romberg negative.  No pronator drift.   Gait normal.   Lab Results  Component Value Date   HGBA1C 6.3 (H) 07/14/2020   HGBA1C 6.4 07/02/2020   HGBA1C 6.3 12/29/2019    Lab Results  Component Value Date   CREATININE 0.76 07/20/2020   CREATININE 0.77 07/15/2020   CREATININE 0.86 07/14/2020    Lab Results   Component Value Date   WBC 6.6 07/20/2020   HGB 13.2 07/20/2020   HCT 39.7 07/20/2020   PLT 193.0 07/20/2020   GLUCOSE 148 (H) 07/20/2020   CHOL 142 10/11/2013   TRIG 194.0 (H) 10/11/2013   HDL 43.10 10/11/2013   LDLDIRECT 86.3 06/30/2012   LDLCALC 60 10/11/2013   ALT 10 07/20/2020   AST 14 07/20/2020   NA 137 07/20/2020   K 3.9 07/20/2020   CL 100 07/20/2020   CREATININE 0.76 07/20/2020   BUN 23 07/20/2020   CO2 28 07/20/2020   TSH 3.15 12/29/2019   INR 1.1 07/13/2020   HGBA1C 6.3 (H) 07/14/2020   MICROALBUR <0.7 07/02/2020    CT HEAD WO CONTRAST  Result Date: 07/14/2020 CLINICAL DATA:  87 year old female status post fall with posttraumatic shear hemorrhages or hemorrhagic contusions. EXAM: CT HEAD WITHOUT CONTRAST TECHNIQUE: Contiguous axial images were obtained from the base of the skull through the vertex without intravenous contrast. COMPARISON:  Head CT 07/13/2020. FINDINGS: Brain: There are trace bilateral subdural hematomas  or less likely posttraumatic subdural hygromas (coronal image 24 and series 3, image 17. These are more apparent since yesterday. Minimal leftward midline shift is stable. Basilar cisterns remain normal. Several small white matter and gray-white matter junction parenchymal hemorrhages in the anterior left frontal lobe are stable since yesterday. Possible small surface hemorrhagic contusion or trace subarachnoid hemorrhage on series 3, image 20. No significant left frontal lobe edema or mass effect. No new parenchymal hemorrhage. Stable gray-white matter differentiation elsewhere. Trace subarachnoid hemorrhages layering in both sylvian fissures. No intraventricular hemorrhage identified. No ventriculomegaly. No cortically based acute infarct identified. Vascular: Extensive Calcified atherosclerosis at the skull base. Skull: Left orbital floor and nondisplaced posterior left maxillary sinus fractures are visible. No calvarium fracture identified. Sinuses/Orbits:  Stable hemorrhage in the left maxillary sinus. Stable small volume hemorrhage layering in the left frontal sinus. Mild left ethmoid fluid is stable. Right paranasal sinuses, tympanic cavities and mastoids remain clear. Other: Left face and scalp contusion/hematoma. Globes and intraorbital soft tissues remain normal. IMPRESSION: 1. There are trace bilateral subdural hematomas, more apparent than yesterday. Minimal associated leftward midline shift is stable. 2. Several small anterior left frontal lobe shear hemorrhages or hemorrhagic contusions are stable since yesterday. Associated trace subarachnoid hemorrhage not significantly changed. 3. No new intracranial abnormality. 4. Left orbit and maxilla fractures with hemorrhage in the left paranasal sinuses again noted. No calvarium fracture identified. Electronically Signed   By: Genevie Ann M.D.   On: 07/14/2020 07:32   CT HEAD WO CONTRAST  Result Date: 07/13/2020 CLINICAL DATA:  Facial trauma, fall EXAM: CT HEAD WITHOUT CONTRAST CT MAXILLOFACIAL WITHOUT CONTRAST CT CERVICAL SPINE WITHOUT CONTRAST TECHNIQUE: Multidetector CT imaging of the head, cervical spine, and maxillofacial structures were performed using the standard protocol without intravenous contrast. Multiplanar CT image reconstructions of the cervical spine and maxillofacial structures were also generated. COMPARISON:  None. FINDINGS: CT HEAD FINDINGS Brain: Patchy and confluent areas of decreased attenuation are noted throughout the deep and periventricular white matter of the cerebral hemispheres bilaterally, compatible with chronic microvascular ischemic disease. No evidence of large-territorial acute infarction. At least four distinct hyperdense foci within the left frontal lobe measuring 6 x 57mm, 6x4 mm, 2x2 mm, 2x69mm (4:18-21). Possible other punctate foci more superiorly (4:24). These appear to be located at the gray-white matter junction. No definite extra-axial collection. No definite  pneumocephalus. No mass effect or midline shift. No hydrocephalus. Basilar cisterns are patent. Vascular: No hyperdense vessel. Atherosclerotic calcifications are present within the cavernous internal carotid and vertebral arteries. Skull: No acute fracture or focal lesion. Other: None. CT MAXILLOFACIAL FINDINGS Osseous: Markedly comminuted and 6 mm depressed left orbital floor fracture. Comminuted nasal process of bilateral maxilla fractures (6:88-92). This is noted to extend posteriorly on the left side to include a minimally displaced fracture of the anterior wall of the left maxillary sinus (6:77) as well as comminuted fracture of the medial wall of the left maxillary sinus (9:36). Left frontal sinus fracture (10:43) that is minimally displaced. Likely left ethmoid sinus fracture. No definite lamina papyracea fracture. No definite crista galli or cribriform plates bilaterally. Sinuses/Orbits: Air-fluid level with hyperdense fluid within the left maxillary sinus. Air-fluid level within the left frontal sinus and ethmoid sinuses. Otherwise the remaining paranasal sinuses and mastoid air cells are clear. Bilateral lens replacement. Otherwise the orbits are unremarkable. Soft tissues: Soft tissue edema the left periorbital region with associated subcutaneus soft tissue emphysema and hematoma formation. Soft tissue edema and hematoma extends inferiorly to the  left maxillary subcutaneus soft tissues. CT CERVICAL SPINE FINDINGS Alignment: Grade 1 anterolisthesis of C4 on C5, C6 on C7, C7 on T1. Otherwise normal. Skull base and vertebrae: Multilevel degenerative changes of the spine worse at the C5-C6 level. No acute fracture. No aggressive appearing focal osseous lesion or focal pathologic process. Soft tissues and spinal canal: No prevertebral fluid or swelling. No visible canal hematoma. Upper chest: Trace biapical pleural/pulmonary scarring. Other: None. IMPRESSION: 1. At least five distinct subcentimeter foci of  intraparenchymal hemorrhage within the left frontal lobe. Underlying mass lesions not excluded. 2. No large territorial ischemic infarction identified. 3. Markedly comminuted and 6 mm depressed left orbital floor fracture. Inferior rectus muscle may be involved. Recommend clinical correlation for entrapment. 4. Minimally displaced anterior wall and comminuted medial wall fracture of the left maxillary sinus. Associated hemorrhage within the left maxillary sinus. 5. Minimally displaced fractures of the left frontal sinus. Possible left ethmoid sinus fracture. 6. Comminuted nasal process of the maxilla fractures bilaterally. 7. Left cribriform plate fracture not excluded. No pneumocephalus identified. 8. No acute displaced fracture or traumatic listhesis of the cervical spine. These results were called by telephone at the time of interpretation on 07/13/2020 at 4:14 pm to provider Dr. Rex Kras, who verbally acknowledged these results. Electronically Signed   By: Iven Finn M.D.   On: 07/13/2020 16:26   CT CERVICAL SPINE WO CONTRAST  Result Date: 07/13/2020 CLINICAL DATA:  Facial trauma, fall EXAM: CT HEAD WITHOUT CONTRAST CT MAXILLOFACIAL WITHOUT CONTRAST CT CERVICAL SPINE WITHOUT CONTRAST TECHNIQUE: Multidetector CT imaging of the head, cervical spine, and maxillofacial structures were performed using the standard protocol without intravenous contrast. Multiplanar CT image reconstructions of the cervical spine and maxillofacial structures were also generated. COMPARISON:  None. FINDINGS: CT HEAD FINDINGS Brain: Patchy and confluent areas of decreased attenuation are noted throughout the deep and periventricular white matter of the cerebral hemispheres bilaterally, compatible with chronic microvascular ischemic disease. No evidence of large-territorial acute infarction. At least four distinct hyperdense foci within the left frontal lobe measuring 6 x 82mm, 6x4 mm, 2x2 mm, 2x27mm (4:18-21). Possible other punctate  foci more superiorly (4:24). These appear to be located at the gray-white matter junction. No definite extra-axial collection. No definite pneumocephalus. No mass effect or midline shift. No hydrocephalus. Basilar cisterns are patent. Vascular: No hyperdense vessel. Atherosclerotic calcifications are present within the cavernous internal carotid and vertebral arteries. Skull: No acute fracture or focal lesion. Other: None. CT MAXILLOFACIAL FINDINGS Osseous: Markedly comminuted and 6 mm depressed left orbital floor fracture. Comminuted nasal process of bilateral maxilla fractures (6:88-92). This is noted to extend posteriorly on the left side to include a minimally displaced fracture of the anterior wall of the left maxillary sinus (6:77) as well as comminuted fracture of the medial wall of the left maxillary sinus (9:36). Left frontal sinus fracture (10:43) that is minimally displaced. Likely left ethmoid sinus fracture. No definite lamina papyracea fracture. No definite crista galli or cribriform plates bilaterally. Sinuses/Orbits: Air-fluid level with hyperdense fluid within the left maxillary sinus. Air-fluid level within the left frontal sinus and ethmoid sinuses. Otherwise the remaining paranasal sinuses and mastoid air cells are clear. Bilateral lens replacement. Otherwise the orbits are unremarkable. Soft tissues: Soft tissue edema the left periorbital region with associated subcutaneus soft tissue emphysema and hematoma formation. Soft tissue edema and hematoma extends inferiorly to the left maxillary subcutaneus soft tissues. CT CERVICAL SPINE FINDINGS Alignment: Grade 1 anterolisthesis of C4 on C5, C6 on C7, C7  on T1. Otherwise normal. Skull base and vertebrae: Multilevel degenerative changes of the spine worse at the C5-C6 level. No acute fracture. No aggressive appearing focal osseous lesion or focal pathologic process. Soft tissues and spinal canal: No prevertebral fluid or swelling. No visible canal  hematoma. Upper chest: Trace biapical pleural/pulmonary scarring. Other: None. IMPRESSION: 1. At least five distinct subcentimeter foci of intraparenchymal hemorrhage within the left frontal lobe. Underlying mass lesions not excluded. 2. No large territorial ischemic infarction identified. 3. Markedly comminuted and 6 mm depressed left orbital floor fracture. Inferior rectus muscle may be involved. Recommend clinical correlation for entrapment. 4. Minimally displaced anterior wall and comminuted medial wall fracture of the left maxillary sinus. Associated hemorrhage within the left maxillary sinus. 5. Minimally displaced fractures of the left frontal sinus. Possible left ethmoid sinus fracture. 6. Comminuted nasal process of the maxilla fractures bilaterally. 7. Left cribriform plate fracture not excluded. No pneumocephalus identified. 8. No acute displaced fracture or traumatic listhesis of the cervical spine. These results were called by telephone at the time of interpretation on 07/13/2020 at 4:14 pm to provider Dr. Rex Kras, who verbally acknowledged these results. Electronically Signed   By: Iven Finn M.D.   On: 07/13/2020 16:26   DG Pelvis Portable  Result Date: 07/13/2020 CLINICAL DATA:  Pain following fall EXAM: PORTABLE PELVIS 1-2 VIEWS COMPARISON:  None. FINDINGS: Frontal pelvis obtained. There is screw and nail fixation in the proximal left femur with alignment at the previous fracture site essentially anatomic. Screw tip in proximal femoral head. There is no appreciable acute fracture or dislocation. There is moderate symmetric narrowing of each hip joint. There is spurring in the pubic symphysis region. No erosion. There is extensive common femoral, superficial femoral, and profunda femoral artery calcification bilaterally. There is degenerative change in the lower lumbar spine. IMPRESSION: Postoperative change proximal left femur. No acute fracture or dislocation. Symmetric moderate narrowing of  each hip joint. Degenerative change in lower lumbar spine. Multiple foci of arterial vascular calcification noted. Electronically Signed   By: Lowella Grip III M.D.   On: 07/13/2020 15:38   DG Hand 2 View Left  Result Date: 07/13/2020 CLINICAL DATA:  Pain following fall EXAM: LEFT HAND - 2 VIEW COMPARISON:  None. FINDINGS: Frontal and lateral views were obtained. No fracture or dislocation. There is osteoarthritic change in all PIP and DIP joints as well as in the first IP joint. No erosive change. There are multiple foci of arterial vascular calcification. IMPRESSION: No acute fracture or dislocation. 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 Foci of vascular calcification noted at several sites. Electronically Signed   By: Lowella Grip III M.D.   On: 07/13/2020 15:40   DG Chest Port 1 View  Result Date: 07/13/2020 CLINICAL DATA:  Pain following fall EXAM: PORTABLE CHEST 1 VIEW COMPARISON:  None FINDINGS: Lungs are clear. Heart is mildly enlarged with pacemaker leads attached to the right atrium and right ventricle. No adenopathy. There is aortic atherosclerosis. No pneumothorax. No bone lesions. IMPRESSION: Cardiomegaly with pacemaker leads attached to right atrium and right ventricle. Lungs clear. No pneumothorax. Aortic Atherosclerosis (ICD10-I70.0). Electronically Signed   By: Lowella Grip III M.D.   On: 07/13/2020 15:39   CT MAXILLOFACIAL WO CONTRAST  Result Date: 07/13/2020 CLINICAL DATA:  Facial trauma, fall EXAM: CT HEAD WITHOUT CONTRAST CT MAXILLOFACIAL WITHOUT CONTRAST CT CERVICAL SPINE WITHOUT CONTRAST TECHNIQUE: Multidetector CT imaging of the head, cervical spine, and maxillofacial structures were performed using the standard protocol without intravenous  contrast. Multiplanar CT image reconstructions of the cervical spine and maxillofacial structures were also generated. COMPARISON:  None. FINDINGS: CT HEAD FINDINGS Brain: Patchy and confluent areas of decreased  attenuation are noted throughout the deep and periventricular white matter of the cerebral hemispheres bilaterally, compatible with chronic microvascular ischemic disease. No evidence of large-territorial acute infarction. At least four distinct hyperdense foci within the left frontal lobe measuring 6 x 53mm, 6x4 mm, 2x2 mm, 2x53mm (4:18-21). Possible other punctate foci more superiorly (4:24). These appear to be located at the gray-white matter junction. No definite extra-axial collection. No definite pneumocephalus. No mass effect or midline shift. No hydrocephalus. Basilar cisterns are patent. Vascular: No hyperdense vessel. Atherosclerotic calcifications are present within the cavernous internal carotid and vertebral arteries. Skull: No acute fracture or focal lesion. Other: None. CT MAXILLOFACIAL FINDINGS Osseous: Markedly comminuted and 6 mm depressed left orbital floor fracture. Comminuted nasal process of bilateral maxilla fractures (6:88-92). This is noted to extend posteriorly on the left side to include a minimally displaced fracture of the anterior wall of the left maxillary sinus (6:77) as well as comminuted fracture of the medial wall of the left maxillary sinus (9:36). Left frontal sinus fracture (10:43) that is minimally displaced. Likely left ethmoid sinus fracture. No definite lamina papyracea fracture. No definite crista galli or cribriform plates bilaterally. Sinuses/Orbits: Air-fluid level with hyperdense fluid within the left maxillary sinus. Air-fluid level within the left frontal sinus and ethmoid sinuses. Otherwise the remaining paranasal sinuses and mastoid air cells are clear. Bilateral lens replacement. Otherwise the orbits are unremarkable. Soft tissues: Soft tissue edema the left periorbital region with associated subcutaneus soft tissue emphysema and hematoma formation. Soft tissue edema and hematoma extends inferiorly to the left maxillary subcutaneus soft tissues. CT CERVICAL SPINE  FINDINGS Alignment: Grade 1 anterolisthesis of C4 on C5, C6 on C7, C7 on T1. Otherwise normal. Skull base and vertebrae: Multilevel degenerative changes of the spine worse at the C5-C6 level. No acute fracture. No aggressive appearing focal osseous lesion or focal pathologic process. Soft tissues and spinal canal: No prevertebral fluid or swelling. No visible canal hematoma. Upper chest: Trace biapical pleural/pulmonary scarring. Other: None. IMPRESSION: 1. At least five distinct subcentimeter foci of intraparenchymal hemorrhage within the left frontal lobe. Underlying mass lesions not excluded. 2. No large territorial ischemic infarction identified. 3. Markedly comminuted and 6 mm depressed left orbital floor fracture. Inferior rectus muscle may be involved. Recommend clinical correlation for entrapment. 4. Minimally displaced anterior wall and comminuted medial wall fracture of the left maxillary sinus. Associated hemorrhage within the left maxillary sinus. 5. Minimally displaced fractures of the left frontal sinus. Possible left ethmoid sinus fracture. 6. Comminuted nasal process of the maxilla fractures bilaterally. 7. Left cribriform plate fracture not excluded. No pneumocephalus identified. 8. No acute displaced fracture or traumatic listhesis of the cervical spine. These results were called by telephone at the time of interpretation on 07/13/2020 at 4:14 pm to provider Dr. Rex Kras, who verbally acknowledged these results. Electronically Signed   By: Iven Finn M.D.   On: 07/13/2020 16:26    Assessment & Plan:   Problem List Items Addressed This Visit      Unprioritized   Chronic diarrhea - Primary (Chronic)   Relevant Orders   CBC with Differential/Platelet (Completed)   Basic metabolic panel (Completed)   Phosphorus (Completed)   Magnesium (Completed)   Hospital discharge follow-up    Patient is stable post discharge and has no new issues or questions about  discharge plans at the visit  today for hospital follow up.  I have reviewed the records from the hospital admission in detail with patient and her daughter today.      RESOLVED: Intracranial hemorrhage after injury without loss of consciousness ([place])   Relevant Orders   AMB Referral to [place]   Intraparenchymal hemorrhage of brain ([place])    Occurred after blunt head trauma during recent fall in the setting of chronic anticoagulation. .  Repeat head CT done during hospitalization showed no evidence of midline shift and her neurologic exam remains normal        Orbital fracture ([place])    Minimally  displaced,  No surgery required        Other Visit Diagnoses    Long-term use of high-risk medication       Relevant Orders   Hepatic function panel (Completed)   History of recent fall       Relevant Orders   AMB Referral to [facility]      I am having Daleen Squibb. Tessmer start on Dermacloud and traMADol. I am also having her maintain her cholecalciferol, apixaban, Cranberry, acetaminophen, estradiol, clobetasol cream, metoprolol succinate, cyanocobalamin, diphenoxylate-atropine, triamcinolone, diphenoxylate-atropine, metoprolol succinate, and acetaminophen.  Meds ordered this encounter  Medications  . Infant Care Products [facility]) OINT    Sig: Apply 1 application topically as needed.    Dispense:  430 g    Refill:  1  . traMADol (ULTRAM) 50 MG tablet    Sig: Take 1 tablet (50 mg total) by mouth every 8 (eight) hours as needed for up to 5 days.    Dispense:  15 tablet    Refill:  0    There are no discontinued medications.  Follow-up: No follow-ups on file.   Crecencio Mc, MD

## 2020-07-20 NOTE — Chronic Care Management (AMB) (Signed)
  Chronic Care Management   Note  07/20/2020 Name: Kara [name] MRN: [id number] DOB: July 31, 1922  Kara [name] is a 87 y.o. year old female who is a primary care patient of PCP Dr. Derrel Nip . I reached out to Kara [name] by phone today in response to a referral sent by Kara [name]'s PCP, Dr. Derrel Nip     Kara [name] was given information about Chronic Care Management services today including:  1. CCM service includes personalized support from designated clinical staff supervised by her physician, including individualized plan of care and coordination with other care providers 2. 24/7 contact phone numbers for assistance for urgent and routine care needs. 3. Service will only be billed when office clinical staff spend 20 minutes or more in a month to coordinate care. 4. Only one practitioner may furnish and bill the service in a calendar month. 5. The patient may stop CCM services at any time (effective at the end of the month) by phone call to the office staff. 6. The patient will be responsible for cost sharing (co-pay) of up to 20% of the service fee (after annual deductible is met).  Patient agreed to services and verbal consent obtained.   Follow up plan: Telephone appointment with care management team member scheduled for:07/23/2020  Kara [name], [address], [place], [place] [address] Direct Dial: [phone] [name] Website: [email or web address]

## 2020-07-22 DIAGNOSIS — S06300A Unspecified focal traumatic brain injury without loss of consciousness, initial encounter: Secondary | ICD-10-CM | POA: Insufficient documentation

## 2020-07-22 DIAGNOSIS — Z09 Encounter for follow-up examination after completed treatment for conditions other than malignant neoplasm: Secondary | ICD-10-CM | POA: Insufficient documentation

## 2020-07-22 HISTORY — DX: Encounter for follow-up examination after completed treatment for conditions other than malignant neoplasm: Z09

## 2020-07-22 NOTE — Assessment & Plan Note (Deleted)
Occurred after blunt head trauma during recent fall in the setting of chronic anticoagulation. .  Repeat head CT done during hospitalization showed no evidence of midline shift and her neurologic exam remains normal

## 2020-07-22 NOTE — Assessment & Plan Note (Signed)
Minimally  displaced,  No surgery required

## 2020-07-22 NOTE — Assessment & Plan Note (Signed)
Patient is stable post discharge and has no new issues or questions about discharge plans at the visit today for hospital follow up.  I have reviewed the records from the hospital admission in detail with patient and her daughter today.

## 2020-07-22 NOTE — Assessment & Plan Note (Signed)
Occurred after blunt head trauma during recent fall in the setting of chronic anticoagulation. .  Repeat head CT done during hospitalization showed no evidence of midline shift and her neurologic exam remains normal

## 2020-07-23 ENCOUNTER — Other Ambulatory Visit: Payer: Medicare Other

## 2020-07-23 ENCOUNTER — Ambulatory Visit (INDEPENDENT_AMBULATORY_CARE_PROVIDER_SITE_OTHER): Payer: Medicare Other | Admitting: *Deleted

## 2020-07-23 DIAGNOSIS — I619 Nontraumatic intracerebral hemorrhage, unspecified: Secondary | ICD-10-CM | POA: Diagnosis not present

## 2020-07-23 DIAGNOSIS — S06300D Unspecified focal traumatic brain injury without loss of consciousness, subsequent encounter: Secondary | ICD-10-CM

## 2020-07-23 DIAGNOSIS — S0285XA Fracture of orbit, unspecified, initial encounter for closed fracture: Secondary | ICD-10-CM

## 2020-07-23 DIAGNOSIS — I10 Essential (primary) hypertension: Secondary | ICD-10-CM

## 2020-07-23 DIAGNOSIS — Z9181 History of falling: Secondary | ICD-10-CM

## 2020-07-23 NOTE — Chronic Care Management (AMB) (Signed)
Chronic Care Management   CCM RN Visit Note  07/23/2020 Name: Kara [name] MRN: [id number] DOB: January 19, 1923  Subjective: Kara [name] is a 87 y.o. year old female who is a primary care patient of Deborra Medina. The care management team was consulted for assistance with disease management and care coordination needs.    Engaged with patient by telephone for initial visit in response to provider referral for case management and/or care coordination services.   Consent to Services:  The patient was given the following information about Chronic Care Management services today, agreed to services, and gave verbal consent: 1. CCM service includes personalized support from designated clinical staff supervised by the primary care provider, including individualized plan of care and coordination with other care providers 2. 24/7 contact phone numbers for assistance for urgent and routine care needs. 3. Service will only be billed when office clinical staff spend 20 minutes or more in a month to coordinate care. 4. Only one practitioner may furnish and bill the service in a calendar month. 5.The patient may stop CCM services at any time (effective at the end of the month) by phone call to the office staff. 6. The patient will be responsible for cost sharing (co-pay) of up to 20% of the service fee (after annual deductible is met). Patient agreed to services and consent obtained.  Patient agreed to services and verbal consent obtained.   Assessment: Review of patient past medical history, allergies, medications, health status, including review of consultants reports, laboratory and other test data, was performed as part of comprehensive evaluation and provision of chronic care management services.   SDOH (Social Determinants of Health) assessments and interventions performed:  SDOH Interventions   Flowsheet Row Most Recent Value  SDOH Interventions   Food Insecurity Interventions Intervention Not Indicated   Financial Strain Interventions Intervention Not Indicated  Housing Interventions Intervention Not Indicated  Intimate Partner Violence Interventions Intervention Not Indicated  Stress Interventions Intervention Not Indicated  Transportation Interventions Intervention Not Indicated       CCM Care Plan  Allergies  Allergen Reactions  . Baycol [Cerivastatin Sodium]   . Cardizem [Diltiazem Hcl]     LOWER EXTREMITY EDEMA  . Crestor [Rosuvastatin Calcium] Other (See Comments)    INTOLERANT  . Dronedarone Other (See Comments)    Fatigue  . Metoprolol Tartrate Other (See Comments)  . Omeprazole Other (See Comments)  . Pravachol     INTOLERANT  . Statins Other (See Comments)  . Vioxx [Rofecoxib]   . Zocor [Simvastatin]     INTOLERANT    Outpatient Encounter Medications as of 07/23/2020  Medication Sig Note  . acetaminophen (TYLENOL) 325 MG tablet Take 2 tablets (650 mg total) by mouth every 6 (six) hours as needed for mild pain or moderate pain.   . clobetasol cream (TEMOVATE) 0.05 % Apply topically.   . cyanocobalamin (,VITAMIN B-12,) 1000 MCG/ML injection INJECT 1ML INTO THE MUSCLE EVERY 14 DAYS   . diphenoxylate-atropine (LOMOTIL) 2.5-0.025 MG tablet Take 1 tablet by mouth 4 (four) times daily as needed for diarrhea or loose stools.   Marland Kitchen estradiol (ESTRACE) 0.1 MG/GM vaginal cream Place vaginally.   . Infant Care Products (DERMACLOUD) OINT Apply 1 application topically as needed.   . metoprolol succinate (TOPROL-XL) 25 MG 24 hr tablet Take 1 tablet (25 mg total) by mouth in the morning and at bedtime.   . traMADol (ULTRAM) 50 MG tablet Take 1 tablet (50 mg total) by mouth every 8 (eight) hours  as needed for up to 5 days.   Marland Kitchen triamcinolone (KENALOG) 0.1 % APPLY 1 APPLICATION TOPICALLY 2 TIMES DAILY TO VULVA UNTIL ITCHING RESOLVES THEN REDUCE USE TO TWICE WEEKLY.   Marland Kitchen acetaminophen (TYLENOL) 325 MG tablet Take 2 tablets (650 mg total) by mouth every 6 (six) hours as needed for mild pain  or moderate pain (pain score 1-3 or temp > 100.5).   Marland Kitchen apixaban (ELIQUIS) 2.5 MG TABS tablet Take 2.5 mg by mouth 2 (two) times daily. (Patient not taking: No sig reported)   . cholecalciferol (VITAMIN D) 1000 UNITS tablet Take 1,000 Units by mouth daily. (Patient not taking: Reported on 07/23/2020) 07/23/2020: Reports not taking in a while  . Cranberry 200 MG CAPS Take 1 capsule (200 mg total) by mouth daily. (Patient not taking: Reported on 07/23/2020) 07/23/2020: Reports not taking at this time  . diphenoxylate-atropine (LOMOTIL) 2.5-0.025 MG tablet TAKE 1 TABLET BY MOUTH THREE TIMES DAILYAS NEEDED FOR DIARRHEA 0/01/4708: duplicate  . metoprolol succinate (TOPROL-XL) 25 MG 24 hr tablet Take 25 mg by mouth in the morning and at bedtime. 10/18/8364: duplicate   No facility-administered encounter medications on file as of 07/23/2020.    Patient Active Problem List   Diagnosis Date Noted  . Hospital discharge follow-up 07/22/2020  . Intraparenchymal hemorrhage of brain ([place]) 07/13/2020  . Syncope and collapse 07/13/2020  . Facial fracture due to fall ([place]) 07/13/2020  . Orbital fracture ([place]) 07/13/2020  . Hypokalemia 07/13/2020  . Atrial fibrillation, chronic ([place]) 07/13/2020  . HTN (hypertension) 07/13/2020  . Type 2 diabetes mellitus ([place]) 07/13/2020  . DNR (do not resuscitate) 07/13/2020  . S/P placement of cardiac pacemaker 07/03/2020  . Myalgia due to statin 07/03/2020  . Aortic atherosclerosis ([place]) 07/03/2020  . Displaced intertrochanteric fracture of left femur, sequela 06/25/2019  . Age-related osteoporosis with current pathological fracture with routine healing 06/15/2019  . Sciatica of right side 02/18/2019  . Vaginitis and vulvovaginitis 12/26/2018  . Educated about COVID-19 virus infection 10/12/2018  . Edema of lower extremity 11/24/2017  . Exertional dyspnea 06/16/2017  . Venous insufficiency of both lower extremities 11/24/2016  . Essential hypertension 11/11/2016  . Peripheral  artery disease ([place]) 11/11/2016  . Chronic pulmonary hypertension ([place]) 10/20/2016  . Leg swelling 06/17/2016  . Type 2 diabetes mellitus with diabetic neuropathy, unspecified ([place]) 06/17/2016  . Urinary incontinence, urge 12/27/2015  . Atrophic vaginitis 09/26/2015  . Severe tricuspid valve insufficiency 07/04/2015  . Umbilical hernia without obstruction and without gangrene 06/26/2015  . Carpal tunnel syndrome of right wrist 05/29/2015  . Chronic fatigue 05/01/2015  . Vaginal atrophy 11/21/2014  . Urethral caruncle 11/21/2014  . Mitral valve regurgitation 05/09/2014  . Fatigue 03/09/2014  . SSS (sick sinus syndrome) ([place]) 10/11/2013  . Encounter for current long-term use of anticoagulants 10/06/2012  . Osteoarthritis 03/08/2012  . Screening for breast cancer 03/08/2012  . Pernicious anemia 02/04/2012  . History of colectomy 02/04/2012  . Vitamin D deficiency 07/30/2011  . Acquired thrombophilia ([place]) 06/13/2011  . Coronary atherosclerosis due to calcified coronary lesion 06/13/2011  . Chronic diarrhea 06/13/2011    Conditions to be addressed/monitored:HTN and Falls  Care Plan : Hypertension (Adult)  Updates made by Leona Singleton, RN since 07/23/2020 12:00 AM  Problem: Hypertension (Hypertension)   Goal: Hypertension Monitored   Start Date: 07/23/2020  Expected End Date: 01/15/2021  Priority: Medium  Current Barriers:  Marland Kitchen Knowledge Deficits related to basic understanding of hypertension pathophysiology and self care management as evidenced  by patient stating she does not monitor blood pressures at home.  Discussed recent fall with intracranial hemorrhage.  Patient denies any current headache or signs or symptoms of hypertension.  Does state the nurses could monitor blood pressures at [facility], but feels she is unable to get down to nursing station at this time.  Discussed her asking the therapist to check blood pressures prior to therapy sessions until she can  safely get to nurses station for blood pressure checks. . Unable to perform ADLs independently . Unable to perform IADLs independently Nurse Case Manager Clinical Goal(s):  Marland Kitchen Over the next 90 days, patient will verbalize understanding of plan for hypertension management . Over the next 120 days, patient will demonstrate improved adherence to prescribed treatment plan for hypertension as evidenced by taking all medications as prescribed, monitoring and recording blood pressure as directed (twice a week), adhering to low sodium/DASH diet Interventions:  . Collaboration with Deborra Medina regarding development and update of comprehensive plan of care as evidenced by provider attestation and co-signature . Inter-disciplinary care team collaboration (see longitudinal plan of care) . Evaluation of current treatment plan related to hypertension self management and patient's adherence to plan as established by provider. . Provided education to patient re: stroke prevention, s/s of heart attack and stroke, DASH diet, complications of uncontrolled blood pressure . Reviewed medications with patient and discussed importance of compliance . Discussed plans with patient for ongoing care management follow up and provided patient with direct contact information for care management team . Advised patient, providing education and rationale, to monitor blood pressure twice a week and record, calling PCP for findings outside established parameters.  . Reviewed scheduled/upcoming provider appointments including: Cardiology appointment on 07/24/2020 . Encouraged patient to request blood pressure checks by therapist prior to therapy sessions and to write readings down for provider review until she is able to go down to nursing station to have nurses check blood pressures . Sending 2022 Calendar Booklet to help document blood pressures Patient Goals/Self-Care Activities:  Over the next 30 days, patient will: . Check blood  pressure at least 2 times per week . Write blood pressure results in a log for provider review  . Request therapist to check blood pressures prior to therapy sessions . When able to make it down to nursing desk, request nursing staff to check blood pressures at least twice a week and keep log for provider review Follow Up Plan: The care management team will reach out to the patient again over the next 15 business days.     Care Plan : Fall Risk (Adult)  Updates made by Leona Singleton, RN since 07/23/2020 12:00 AM  Problem: Increase fall risk related to resent fall with orbital and facial  fractures   Priority: High  Goal: Patient will report no more falls within the next 45 days   Start Date: 07/23/2020  Expected End Date: 09/06/2020  Priority: High  Current Barriers:  Marland Kitchen Knowledge Deficits related to fall precautions in patient with recent fall resulting in facial and orbital fractures and intracranial hemorrhage.  Patient reports she was rushing to play bridge and she is not sure if she tripped or lost balance, but ended up hitting floor hard.  States she continues to be sore on her left side (arm, side, back, leg).  Denies any headaches at this time.  Reports taking Tylenol and Tramadol for pain (would like to know if she could take a whole tablet of Tramadol-OK per  Dr. Derrel Nip).  Denies any confusion or increase in drowsiness with taking half tablet of Tramadol.  Patient reporting she returned home, to her independent living apartment (did not need to go to higher level of care) with the assistance of her daughter, whom is here from out of state.  States she is able to ambulate to bathroom with Rolator.  Does report home therapy has started and they are scheduled to come out again tomorrow. . Decreased adherence to prescribed treatment for fall prevention . Unable to perform ADLs independently at this time, needing assistance . Unable to perform IADLs independently at this time needing  assistance Clinical Goal(s):  . patient will demonstrate improved adherence to prescribed treatment plan for decreasing falls as evidenced by patient reporting no falls in the next 45 days and review of EMR . patient will verbalize using fall risk reduction strategies discussed . patient will not experience additional falls . patient will work with home health therapy to address needs related to fall precautions and increasing strength Interventions:  . Collaboration with Deborra Medina regarding development and update of comprehensive plan of care as evidenced by provider attestation and co-signature . Inter-disciplinary care team collaboration (see longitudinal plan of care) . Provided written and verbal education re: Potential causes of falls and Fall prevention strategies . Reviewed medications and discussed potential side effects of medications such as dizziness and frequent urination . Discussed with Dr. Derrel Nip patient requesting to take whole Tramadol instead of half tablet (ok per MD instructions) . Instructed patient Dr. Derrel Nip ok with taking whole Tramadol tablet but to monitor for any side effects or increase drowsiness, light headiness, or dizziness . Assessed patients knowledge of fall risk prevention  . Reviewed scheduled/upcoming provider appointments including: Cardiology appointment on 07/24/20 . Assistive or adaptive device use encouraged, discussed and encouraged use of walker with all ambulation . Cognition assessed . Fall prevention plan reviewed and updated . Fear of falling, loss of independence and pain acknowledged . Participation in rehabilitation therapy encouraged, encouraged to increase activity slowly as tolerated . Discussed use of heat and cold to help with pain . Encouraged to do range of motion exercises while sitting . Confirmed patient knows not to take Eliquis per provider instruction Patient Goals/Self-Care Deficits:   . Call the doctor if I am feeling too  drowsy with pain medication . Keep my cell phone with me always . Make an emergency alert plan in case I fall . Use a walker with all ambulation . Attend therapy  (work with home therapist) . Do range of motion exercises while sitting (flexing and stretching arms and legs) to help with stiffness . Take pain medication prior to therapy session . Take time and move slowly; try not to rush Follow Up Plan: The care management team will reach out to the patient again over the next 15 business days.      Plan:The care management team will reach out to the patient again over the next 15 business days.   Hubert Azure RN, MSN RN Care Management Coordinator [place] [phone] [email or web address]

## 2020-07-23 NOTE — Patient Instructions (Addendum)
Visit Information  It was nice speaking with you.  Remember to increase activity slowly and use your walker with all ambulation.  Per Dr. Derrel Nip it is ok to take Tramadol 1 tablet every 8 hours as needed for pain (watch for an increase in sleepiness or confusion).  Please call me with any questions [place].  PATIENT GOALS:  Goals Addressed              This Visit's Progress   .  (RNCM) Prevent further Falls and Injury        Timeframe:  Long-Range Goal Priority:  High Start Date:   07/23/20                          Expected End Date:  01/15/21                     Follow Up Date 08/03/20    . Call the doctor if I am feeling too drowsy with pain medication . Keep my cell phone with me always . Make an emergency alert plan in case I fall . Use a walker with all ambulation . Attend therapy  (work with home therapist) . Do range of motion exercises while sitting (flexing and stretching arms and legs) to help with stiffness . Take pain medication prior to therapy session . Take time and move slowly; try not to rush   Why is this important?    Most falls happen when it is hard for you to walk safely. Your balance may be off because of an illness. You may have pain in your knees, hip or other joints.   You may be overly tired or taking medicines that make you sleepy. You may not be able to see or hear clearly.   Falls can lead to broken bones, bruises or other injuries.   There are things you can do to help prevent falling.     Notes:    Marland Kitchen  [facility]) Track and Manage My Blood Pressure-Hypertension        Timeframe:  Long-Range Goal Priority:  Medium Start Date:  07/23/20                           Expected End Date:   01/15/21                    Follow Up Date 08/03/20    . Check blood pressure at least 2 times per week . Write blood pressure results in a log for provider review  . Request therapist to check blood pressures prior to therapy sessions . When able  to make it down to nursing desk, request nursing staff to check blood pressures at least twice a week and keep log for provider review   Why is this important?    You won't feel high blood pressure, but it can still hurt your blood vessels.   High blood pressure can cause heart or kidney problems. It can also cause a stroke.   Making lifestyle changes like losing a little weight or eating less salt will help.   Checking your blood pressure at home and at different times of the day can help to control blood pressure.   If the doctor prescribes medicine remember to take it the way the doctor ordered.   Call the office if you cannot afford the medicine or  if there are questions about it.     Notes:     Consent to CCM Services: Ms. Morioka was given information about Chronic Care Management services today including:  1. CCM service includes personalized support from designated clinical staff supervised by her physician, including individualized plan of care and coordination with other care providers 2. 24/7 contact phone numbers for assistance for urgent and routine care needs. 3. Service will only be billed when office clinical staff spend 20 minutes or more in a month to coordinate care. 4. Only one practitioner may furnish and bill the service in a calendar month. 5. The patient may stop CCM services at any time (effective at the end of the month) by phone call to the office staff. 6. The patient will be responsible for cost sharing (co-pay) of up to 20% of the service fee (after annual deductible is met).  Patient agreed to services and verbal consent obtained.   Patient verbalizes understanding of instructions provided today and agrees to view in [place].   The care management team will reach out to the patient again over the next 15 business days days.   Hubert Azure RN, MSN RN Care Management Coordinator [place] [phone] [email or web address]   CLINICAL CARE PLAN: Patient Care Plan: Hypertension (Adult)  Problem Identified: Hypertension (Hypertension)   Goal: Hypertension Monitored   Start Date: 07/23/2020  Expected End Date: 01/15/2021  Priority: Medium  Current Barriers:  Marland Kitchen Knowledge Deficits related to basic understanding of hypertension pathophysiology and self care management as evidenced by patient stating she does not monitor blood pressures at home.  Discussed recent fall with intracranial hemorrhage.  Patient denies any current headache or signs or symptoms of hypertension.  Does state the nurses could monitor blood pressures at [facility], but feels she is unable to get down to nursing station at this time.  Discussed her asking the therapist to check blood pressures prior to therapy sessions until she can safely get to nurses station for blood pressure checks. . Unable to perform ADLs independently . Unable to perform IADLs independently Nurse Case Manager Clinical Goal(s):  Marland Kitchen Over the next 90 days, patient will verbalize understanding of plan for hypertension management . Over the next 120 days, patient will demonstrate improved adherence to prescribed treatment plan for hypertension as evidenced by taking all medications as prescribed, monitoring and recording blood pressure as directed (twice a week), adhering to low sodium/DASH diet Interventions:  . Collaboration with Deborra Medina regarding development and update of comprehensive plan of care as evidenced by provider attestation and co-signature . Inter-disciplinary care team collaboration (see longitudinal plan of care) . Evaluation of current treatment plan related to hypertension self management and patient's adherence to plan as established by provider. . Provided education to patient re: stroke prevention, s/s of heart attack and stroke, DASH diet, complications of uncontrolled blood  pressure . Reviewed medications with patient and discussed importance of compliance . Discussed plans with patient for ongoing care management follow up and provided patient with direct contact information for care management team . Advised patient, providing education and rationale, to monitor blood pressure twice a week and record, calling PCP for findings outside established parameters.  . Reviewed scheduled/upcoming provider appointments including: Cardiology appointment on 07/24/2020 . Encouraged patient to request blood pressure checks by therapist prior to therapy sessions and to write readings down for provider review until she is able to go down to nursing station to have nurses check blood pressures .  Sending 2022 Calendar Booklet to help document blood pressures Patient Goals/Self-Care Activities:  Over the next 30 days, patient will: . Check blood pressure at least 2 times per week . Write blood pressure results in a log for provider review  . Request therapist to check blood pressures prior to therapy sessions . When able to make it down to nursing desk, request nursing staff to check blood pressures at least twice a week and keep log for provider review Follow Up Plan: The care management team will reach out to the patient again over the next 15 business days.     Patient Care Plan: Fall Risk (Adult)  Problem Identified: Increase fall risk related to resent fall with orbital and facial  fractures   Priority: High  Goal: Patient will report no more falls within the next 45 days   Start Date: 07/23/2020  Expected End Date: 09/06/2020  Priority: High  Current Barriers:  Marland Kitchen Knowledge Deficits related to fall precautions in patient with recent fall resulting in facial and orbital fractures and intracranial hemorrhage.  Patient reports she was rushing to play bridge and she is not sure if she tripped or lost balance, but ended up hitting floor hard.  States she continues to be sore on her  left side (arm, side, back, leg).  Denies any headaches at this time.  Reports taking Tylenol and Tramadol for pain (would like to know if she could take a whole tablet of Tramadol-OK per Dr. Derrel Nip).  Denies any confusion or increase in drowsiness with taking half tablet of Tramadol.  Patient reporting she returned home, to her independent living apartment (did not need to go to higher level of care) with the assistance of her daughter, whom is here from out of state.  States she is able to ambulate to bathroom with Rolator.  Does report home therapy has started and they are scheduled to come out again tomorrow. . Decreased adherence to prescribed treatment for fall prevention . Unable to perform ADLs independently at this time, needing assistance . Unable to perform IADLs independently at this time needing assistance Clinical Goal(s):  . patient will demonstrate improved adherence to prescribed treatment plan for decreasing falls as evidenced by patient reporting no falls in the next 45 days and review of EMR . patient will verbalize using fall risk reduction strategies discussed . patient will not experience additional falls . patient will work with home health therapy to address needs related to fall precautions and increasing strength Interventions:  . Collaboration with Deborra Medina regarding development and update of comprehensive plan of care as evidenced by provider attestation and co-signature . Inter-disciplinary care team collaboration (see longitudinal plan of care) . Provided written and verbal education re: Potential causes of falls and Fall prevention strategies . Reviewed medications and discussed potential side effects of medications such as dizziness and frequent urination . Discussed with Dr. Derrel Nip patient requesting to take whole Tramadol instead of half tablet (ok per MD instructions) . Instructed patient Dr. Derrel Nip ok with taking whole Tramadol tablet but to monitor for any side  effects or increase drowsiness, light headiness, or dizziness . Assessed patients knowledge of fall risk prevention  . Reviewed scheduled/upcoming provider appointments including: Cardiology appointment on 07/24/20 . Assistive or adaptive device use encouraged, discussed and encouraged use of walker with all ambulation . Cognition assessed . Fall prevention plan reviewed and updated . Fear of falling, loss of independence and pain acknowledged . Participation in rehabilitation therapy encouraged, encouraged to  increase activity slowly as tolerated . Discussed use of heat and cold to help with pain . Encouraged to do range of motion exercises while sitting . Confirmed patient knows not to take Eliquis per provider instruction Patient Goals/Self-Care Deficits:   . Call the doctor if I am feeling too drowsy with pain medication . Keep my cell phone with me always . Make an emergency alert plan in case I fall . Use a walker with all ambulation . Attend therapy  (work with home therapist) . Do range of motion exercises while sitting (flexing and stretching arms and legs) to help with stiffness . Take pain medication prior to therapy session . Take time and move slowly; try not to rush Follow Up Plan: The care management team will reach out to the patient again over the next 15 business days.      Head Injury, Adult There are many types of head injuries. They can be as minor as a small bump. Some head injuries can be worse. Worse injuries include:  A strong hit to the head that shakes the brain back and forth, causing damage (concussion).  A bruise (contusion) of the brain. This means there is bleeding in the brain that can cause swelling.  A cracked skull (skull fracture).  Bleeding in the brain that gathers, gets thick (makes a clot), and forms a bump (hematoma). Most problems from a head injury come in the first 24 hours. However, you may still have side effects up to 7-10 days after  your injury. It is important to watch your condition for any changes. You may need to be watched in the emergency department or urgent care, or you may need to stay in the hospital. What are the causes? There are many possible causes of a head injury. A serious head injury may be caused by:  A car accident.  Bicycle or motorcycle accidents.  Sports injuries.  Falls.  Being hit by an object. What are the signs or symptoms? Symptoms of a head injury include a bruise, bump, or bleeding where the injury happened. Other physical symptoms may include:  Headache.  Feeling like you may vomit (nauseous) or vomiting.  Dizziness.  Blurred or double vision.  Being uncomfortable around bright lights or loud noises.  Shaking movements that you cannot control (seizures).  Feeling tired.  Trouble being woken up.  Fainting or loss of consciousness. Mental or emotional symptoms may include:  Feeling grumpy or cranky.  Confusion and memory problems.  Having trouble paying attention or concentrating.  Changes in eating or sleeping habits.  Feeling worried or nervous (anxious).  Feeling sad (depressed). How is this treated? Treatment for this condition depends on how severe the injury is and the type of injury you have. The main goal is to prevent problems and to allow the brain time to heal. Mild head injury If you have a mild head injury, you may be sent home, and treatment may include:  Being watched. A responsible adult should stay with you for 24 hours after your injury and check on you often.  Physical rest.  Brain rest.  Pain medicines. Severe head injury If you have a severe head injury, treatment may include:  Being watched closely. This includes staying in the hospital.  Medicines to: ? Help with pain. ? Prevent seizures. ? Help with brain swelling.  Protecting your airway and using a machine that helps you breathe (ventilator).  Treatments to watch for and  manage swelling inside the brain.  Brain surgery. This may be needed to: ? Remove a collection of blood or blood clots. ? Stop the bleeding. ? Remove a part of the skull. This allows room for the brain to swell. Follow these instructions at home: Activity  Rest.  Avoid activities that are hard or tiring.  Make sure you get enough sleep.  Let your brain rest. Do this by limiting activities that need a lot of thought or attention, such as: ? Watching TV. ? Playing memory games and puzzles. ? Job-related work or homework. ? Working on [employer], and texting.  Avoid activities that could cause another head injury until your doctor says it is okay. This includes playing sports. Having another head injury, especially before the first one has healed, can be dangerous.  Ask your doctor when it is safe for you to go back to your normal activities, such as work or school. Ask your doctor for a step-by-step plan for slowly going back to your normal activities.  Ask your doctor when you can drive, ride a bicycle, or use heavy machinery. Do not do these activities if you are dizzy. Lifestyle  Do not drink alcohol until your doctor says it is okay.  Do not use drugs.  If it is harder than usual to remember things, write them down.  If you are easily distracted, try to do one thing at a time.  Talk with family members or close friends when making important decisions.  Tell your friends, family, a trusted co-worker, and work Freight forward[employer] about your injury, symptoms, and limits (restrictions). Have them watch for any problems that are new or getting worse.   General instructions  Take over-the-counter and prescription medicines only as told by your doctor.  Have someone stay with you for 24 hours after your head injury. This person should watch you for any changes in your symptoms and be ready to get help.  Keep all follow-up visits as told by your doctor. This is  important. How is this prevented?  Work on [employer]. This can help you avoid falls.  Wear a seat belt when you are in a moving vehicle.  Wear a helmet when you: ? Ride a bicycle. ? Ski. ? Do any other sport or activity that has a risk of injury.  If you drink alcohol: ? Limit how much you use to:  0-1 drink a day for nonpregnant women.  0-2 drinks a day for men. ? Be aware of how much alcohol is in your drink. In the [place] one drink equals one 12 oz bottle of beer (355 mL), one 5 oz glass of wine (148 mL), or one 1 oz glass of hard liquor (44 mL).  Make your home safer by: ? Getting rid of clutter from the floors and stairs. This includes things that can make you trip. ? Using grab bars in bathrooms and handrails by stairs. ? Placing non-slip mats on floors and in bathtubs. ? Putting more light in dim areas. Where to find more information  Centers for Disease Control and Prevention: http://www.wolf.info/ Get help right away if:  You have: ? A very bad headache that is not helped by medicine. ? Trouble walking or weakness in your arms and legs. ? Clear or bloody fluid coming from your nose or ears. ? Changes in how you see (vision). ? A seizure. ? More confusion or more grumpy moods.  Your symptoms get worse.  You are sleepier than normal and have  trouble staying awake.  You lose your balance.  The black centers of your eyes (pupils) change in size.  Your speech is slurred.  Your dizziness gets worse.  You vomit. These symptoms may be an emergency. Do not wait to see if the symptoms will go away. Get medical help right away. Call your local emergency services (911 in the [place] Do not drive yourself to the hospital. Summary  Head injuries can be as minor as a small bump. Some head injuries can be worse.  Treatment for this condition depends on how severe the injury is and the type of injury you have.  Have someone stay with you for 24 hours after your  head injury.  Ask your doctor when it is safe for you to go back to your normal activities, such as work or school.  To prevent a head injury, wear a seat belt in a car, wear a helmet when you use a bicycle, limit your alcohol use, and make your home safer. This information is not intended to replace advice given to you by your health care provider. Make sure you discuss any questions you have with your health care provider. Document Revised: 03/18/2019 Document Reviewed: 03/18/2019 Elsevier Patient Education  2021 [employer].

## 2020-07-24 DIAGNOSIS — I340 Nonrheumatic mitral (valve) insufficiency: Secondary | ICD-10-CM | POA: Diagnosis not present

## 2020-07-24 DIAGNOSIS — I2510 Atherosclerotic heart disease of native coronary artery without angina pectoris: Secondary | ICD-10-CM | POA: Diagnosis not present

## 2020-07-24 DIAGNOSIS — S0282XS Fracture of other specified skull and facial bones, left side, sequela: Secondary | ICD-10-CM | POA: Diagnosis not present

## 2020-07-24 DIAGNOSIS — E782 Mixed hyperlipidemia: Secondary | ICD-10-CM | POA: Diagnosis not present

## 2020-07-24 DIAGNOSIS — Z9181 History of falling: Secondary | ICD-10-CM | POA: Diagnosis not present

## 2020-07-24 DIAGNOSIS — I10 Essential (primary) hypertension: Secondary | ICD-10-CM | POA: Diagnosis not present

## 2020-07-24 DIAGNOSIS — S0282XD Fracture of other specified skull and facial bones, left side, subsequent encounter for fracture with routine healing: Secondary | ICD-10-CM | POA: Diagnosis not present

## 2020-07-24 DIAGNOSIS — M6281 Muscle weakness (generalized): Secondary | ICD-10-CM | POA: Diagnosis not present

## 2020-07-24 DIAGNOSIS — I4819 Other persistent atrial fibrillation: Secondary | ICD-10-CM | POA: Diagnosis not present

## 2020-07-24 DIAGNOSIS — M25542 Pain in joints of left hand: Secondary | ICD-10-CM | POA: Diagnosis not present

## 2020-07-24 DIAGNOSIS — S0292XD Unspecified fracture of facial bones, subsequent encounter for fracture with routine healing: Secondary | ICD-10-CM | POA: Diagnosis not present

## 2020-07-26 DIAGNOSIS — S0282XS Fracture of other specified skull and facial bones, left side, sequela: Secondary | ICD-10-CM | POA: Diagnosis not present

## 2020-07-26 DIAGNOSIS — S0292XD Unspecified fracture of facial bones, subsequent encounter for fracture with routine healing: Secondary | ICD-10-CM | POA: Diagnosis not present

## 2020-07-26 DIAGNOSIS — M25542 Pain in joints of left hand: Secondary | ICD-10-CM | POA: Diagnosis not present

## 2020-07-26 DIAGNOSIS — Z9181 History of falling: Secondary | ICD-10-CM | POA: Diagnosis not present

## 2020-07-26 DIAGNOSIS — S0282XD Fracture of other specified skull and facial bones, left side, subsequent encounter for fracture with routine healing: Secondary | ICD-10-CM | POA: Diagnosis not present

## 2020-07-26 DIAGNOSIS — M6281 Muscle weakness (generalized): Secondary | ICD-10-CM | POA: Diagnosis not present

## 2020-07-27 ENCOUNTER — Telehealth: Payer: Self-pay | Admitting: Internal Medicine

## 2020-07-27 ENCOUNTER — Telehealth: Payer: Self-pay

## 2020-07-27 MED ORDER — ACETAMINOPHEN-CODEINE #3 300-30 MG PO TABS: 1.0000 | ORAL_TABLET | Freq: Four times a day (QID) | ORAL | 0 refills | Status: DC | PRN

## 2020-07-27 MED ORDER — TRAMADOL HCL 50 MG PO TABS: 50.0000 mg | ORAL_TABLET | Freq: Three times a day (TID) | ORAL | 0 refills | Status: DC | PRN

## 2020-07-27 NOTE — Telephone Encounter (Signed)
Tramadol 50 mg  Qty #15 called in.  Have her start with 1/2 tablet , may repeat dose in 1 hour if no improvement

## 2020-07-27 NOTE — Telephone Encounter (Signed)
Pt called she is still in pain and wanted to know if the Tramadol could be called in since the Tylenol is not helping with pain

## 2020-07-27 NOTE — Telephone Encounter (Signed)
Spoke with pt and she stated that she has already increased to a whole tablet and taking the tylenol 1 tablet every 4 hours. I have scheduled pt to come in for a follow up on Monday to discuss other treatment options because the pain is not getting any better and last night she stated she was not able to get comfortable in bed. Pt stated that she has some acetaminophen-codeine 325mg -30mg  that she was given after a dentist appt. Pt was wondering if that would help control the pain better or if she could even take it.

## 2020-07-27 NOTE — Telephone Encounter (Signed)
Cancel the new tramadol rx.   Stop the plain tylenol.   I will call in more tylenol with codeine  And she  can take 1-2 tablets  every 6 hours  As needed

## 2020-07-27 NOTE — Addendum Note (Signed)
Addended by: Crecencio Mc on: 07/27/2020 10:54 AM   Modules accepted: Orders

## 2020-07-27 NOTE — Telephone Encounter (Signed)
Pt is still in pain from fall and stated that the tylenol is not helping. Pt is wanting to know if the Tramadol can be called in.

## 2020-07-27 NOTE — Telephone Encounter (Signed)
Noted  

## 2020-07-27 NOTE — Telephone Encounter (Signed)
[facility] called stated that the prescription was going out for delivery but she will stop it

## 2020-07-27 NOTE — Addendum Note (Signed)
Addended by: Crecencio Mc on: 07/27/2020 01:41 PM   Modules accepted: Orders

## 2020-07-30 ENCOUNTER — Ambulatory Visit (INDEPENDENT_AMBULATORY_CARE_PROVIDER_SITE_OTHER): Payer: Medicare Other | Admitting: Internal Medicine

## 2020-07-30 ENCOUNTER — Ambulatory Visit (INDEPENDENT_AMBULATORY_CARE_PROVIDER_SITE_OTHER): Payer: Medicare Other

## 2020-07-30 ENCOUNTER — Telehealth: Payer: Self-pay | Admitting: Internal Medicine

## 2020-07-30 ENCOUNTER — Encounter
Admission: RE | Admit: 2020-07-30 | Discharge: 2020-07-30 | Disposition: A | Discharge status: Home / Self Care | Payer: Medicare Other | Source: Ambulatory Visit | Attending: Internal Medicine | Admitting: Internal Medicine

## 2020-07-30 ENCOUNTER — Encounter: Payer: Self-pay | Admitting: Internal Medicine

## 2020-07-30 ENCOUNTER — Other Ambulatory Visit: Payer: Self-pay

## 2020-07-30 VITALS — BP 150/82 | HR 102 | Temp 98.1°F | Resp 15 | Ht 62.0 in | Wt 127.0 lb

## 2020-07-30 DIAGNOSIS — M79642 Pain in left hand: Secondary | ICD-10-CM | POA: Diagnosis not present

## 2020-07-30 DIAGNOSIS — I272 Pulmonary hypertension, unspecified: Secondary | ICD-10-CM | POA: Diagnosis not present

## 2020-07-30 DIAGNOSIS — S72142D Displaced intertrochanteric fracture of left femur, subsequent encounter for closed fracture with routine healing: Secondary | ICD-10-CM | POA: Diagnosis not present

## 2020-07-30 DIAGNOSIS — S06369S Traumatic hemorrhage of cerebrum, unspecified, with loss of consciousness of unspecified duration, sequela: Secondary | ICD-10-CM | POA: Diagnosis not present

## 2020-07-30 DIAGNOSIS — R41841 Cognitive communication deficit: Secondary | ICD-10-CM | POA: Diagnosis not present

## 2020-07-30 DIAGNOSIS — R2689 Other abnormalities of gait and mobility: Secondary | ICD-10-CM | POA: Diagnosis not present

## 2020-07-30 DIAGNOSIS — M7918 Myalgia, other site: Secondary | ICD-10-CM | POA: Diagnosis not present

## 2020-07-30 DIAGNOSIS — E782 Mixed hyperlipidemia: Secondary | ICD-10-CM | POA: Diagnosis not present

## 2020-07-30 DIAGNOSIS — H353131 Nonexudative age-related macular degeneration, bilateral, early dry stage: Secondary | ICD-10-CM | POA: Diagnosis not present

## 2020-07-30 DIAGNOSIS — I2584 Coronary atherosclerosis due to calcified coronary lesion: Secondary | ICD-10-CM | POA: Diagnosis not present

## 2020-07-30 DIAGNOSIS — R262 Difficulty in walking, not elsewhere classified: Secondary | ICD-10-CM | POA: Diagnosis not present

## 2020-07-30 DIAGNOSIS — S06369D Traumatic hemorrhage of cerebrum, unspecified, with loss of consciousness of unspecified duration, subsequent encounter: Secondary | ICD-10-CM | POA: Diagnosis not present

## 2020-07-30 DIAGNOSIS — L292 Pruritus vulvae: Secondary | ICD-10-CM | POA: Diagnosis not present

## 2020-07-30 DIAGNOSIS — E559 Vitamin D deficiency, unspecified: Secondary | ICD-10-CM | POA: Diagnosis not present

## 2020-07-30 DIAGNOSIS — Z9181 History of falling: Secondary | ICD-10-CM | POA: Diagnosis not present

## 2020-07-30 DIAGNOSIS — I10 Essential (primary) hypertension: Secondary | ICD-10-CM | POA: Diagnosis not present

## 2020-07-30 DIAGNOSIS — I071 Rheumatic tricuspid insufficiency: Secondary | ICD-10-CM | POA: Diagnosis not present

## 2020-07-30 DIAGNOSIS — R600 Localized edema: Secondary | ICD-10-CM | POA: Diagnosis not present

## 2020-07-30 DIAGNOSIS — S62357S Nondisplaced fracture of shaft of fifth metacarpal bone, left hand, sequela: Secondary | ICD-10-CM | POA: Diagnosis not present

## 2020-07-30 DIAGNOSIS — Z950 Presence of cardiac pacemaker: Secondary | ICD-10-CM | POA: Diagnosis not present

## 2020-07-30 DIAGNOSIS — M80052D Age-related osteoporosis with current pathological fracture, left femur, subsequent encounter for fracture with routine healing: Secondary | ICD-10-CM | POA: Diagnosis not present

## 2020-07-30 DIAGNOSIS — I2510 Atherosclerotic heart disease of native coronary artery without angina pectoris: Secondary | ICD-10-CM

## 2020-07-30 DIAGNOSIS — Z7901 Long term (current) use of anticoagulants: Secondary | ICD-10-CM | POA: Diagnosis not present

## 2020-07-30 DIAGNOSIS — I739 Peripheral vascular disease, unspecified: Secondary | ICD-10-CM | POA: Diagnosis not present

## 2020-07-30 DIAGNOSIS — I495 Sick sinus syndrome: Secondary | ICD-10-CM | POA: Diagnosis not present

## 2020-07-30 DIAGNOSIS — S62347A Nondisplaced fracture of base of fifth metacarpal bone. left hand, initial encounter for closed fracture: Secondary | ICD-10-CM | POA: Diagnosis not present

## 2020-07-30 DIAGNOSIS — I4819 Other persistent atrial fibrillation: Secondary | ICD-10-CM | POA: Diagnosis not present

## 2020-07-30 DIAGNOSIS — M6281 Muscle weakness (generalized): Secondary | ICD-10-CM | POA: Diagnosis not present

## 2020-07-30 DIAGNOSIS — M25552 Pain in left hip: Secondary | ICD-10-CM | POA: Diagnosis not present

## 2020-07-30 DIAGNOSIS — E1140 Type 2 diabetes mellitus with diabetic neuropathy, unspecified: Secondary | ICD-10-CM | POA: Diagnosis not present

## 2020-07-30 DIAGNOSIS — Z8542 Personal history of malignant neoplasm of other parts of uterus: Secondary | ICD-10-CM | POA: Diagnosis not present

## 2020-07-30 MED ORDER — MELOXICAM 7.5 MG PO TABS: 7.5000 mg | ORAL_TABLET | Freq: Every day | ORAL | 0 refills | Status: DC

## 2020-07-30 MED ORDER — ACETAMINOPHEN-CODEINE #3 300-30 MG PO TABS: 1.0000 | ORAL_TABLET | Freq: Four times a day (QID) | ORAL | 0 refills | Status: DC | PRN

## 2020-07-30 NOTE — Assessment & Plan Note (Signed)
reimaging today given extensive swelling along lateral side.

## 2020-07-30 NOTE — Assessment & Plan Note (Signed)
She is at increased risk of recurrent fall due to current state.  FL-2 signed for temporary placement in [facility] rehab floor

## 2020-07-30 NOTE — Telephone Encounter (Signed)
eliquis has been discontinued and is on longer on her medication list

## 2020-07-30 NOTE — Telephone Encounter (Signed)
Pharmacy is aware and medication will be filled.

## 2020-07-30 NOTE — Assessment & Plan Note (Signed)
SI joint fracture suspected.  Plain films ordered. Tylenol with codien  Adding meloxicam 7.5 mg daily

## 2020-07-30 NOTE — Telephone Encounter (Signed)
[facility] call to let Dr.Tullo now thatmeloxicam (MOBIC) 7.5 MG tablet is causing a inter action with patient's eliquis

## 2020-07-30 NOTE — Progress Notes (Signed)
Subjective:  Patient ID: Kara [name], female    DOB: 03/13/1923  Age: 87 y.o. MRN: [id number]  CC: The primary encounter diagnosis was Left hand pain. Diagnoses of Left buttock pain and History of recent fall were also pertinent to this visit.  HPI MEEAH TOTINO presents for worsening pain involving several sites following a fall at home on Feb 25 ending in ER visit    This visit occurred during the SARS-CoV-2 public health emergency.  Safety protocols were in place, including screening questions prior to the visit, additional usage of staff PPE, and extensive cleaning of exam room while observing appropriate contact time as indicated for disinfecting solutions.   87 yr old female previously anticoagulated for chronic atrial fibrillation here for follow up on ICH and facial bone fractures after falling at home.  Seen on march 4 initially after being sent hom efrom ER on 2/28 with daughter staying with her.  Today having increased pain in Left buttock,  Left hand :  All were imaged on 2/25 in ER and no fractures seen ,  But arthritic changes noted.  Left hip replacement remote, with no change in position of screw heads and no new fractures by pelvic films.  However she states that her pain is not present at rest and is severe and brought on by movement.  Unable to stand unassisted.    Sustained an ICH during fall.  No longer on anticogulant.    Had PT twice last week,  but cancelled today's due to pain.  Walked yesterday using her walker but did not do exercises.  Doesn't feel well but doesn't feel sick .  Started taking tylenol with codeine over the weekend, pain was initially controlled,  until Saturday evening when pain became much worse and has been persistent  Left SI joint.  Left hand swollen   Does not feel able to live alone with current level of pain not controlled on tylenol with codeine.  Unable to ruse from chair unassisted.    Not present at rest,  Unable to get up unassisted.   lives  at [place].  Daughter has gone home and patient lives alone.    Outpatient Medications Prior to Visit  Medication Sig Dispense Refill  . cholecalciferol (VITAMIN D) 1000 UNITS tablet Take 1,000 Units by mouth daily.    . clobetasol cream (TEMOVATE) 0.05 % Apply topically.    . cyanocobalamin (,VITAMIN B-12,) 1000 MCG/ML injection INJECT 1ML INTO THE MUSCLE EVERY 14 DAYS 10 mL 0  . diphenoxylate-atropine (LOMOTIL) 2.5-0.025 MG tablet Take 1 tablet by mouth 4 (four) times daily as needed for diarrhea or loose stools.    Marland Kitchen estradiol (ESTRACE) 0.1 MG/GM vaginal cream Place vaginally.    . Infant Care Products (DERMACLOUD) OINT Apply 1 application topically as needed. 430 g 1  . metoprolol succinate (TOPROL-XL) 25 MG 24 hr tablet Take 1 tablet (25 mg total) by mouth in the morning and at bedtime. 180 tablet 1  . metoprolol succinate (TOPROL-XL) 25 MG 24 hr tablet Take 25 mg by mouth in the morning and at bedtime.    . triamcinolone (KENALOG) 0.1 % APPLY 1 APPLICATION TOPICALLY 2 TIMES DAILY TO VULVA UNTIL ITCHING RESOLVES THEN REDUCE USE TO TWICE WEEKLY. 15 g 1  . acetaminophen-codeine (TYLENOL #3) 300-30 MG tablet Take 1-2 tablets by mouth every 6 (six) hours as needed for moderate pain. REPLACES TRAMADOL RX SENT IN TODAY 30 tablet 0  . apixaban (ELIQUIS) 2.5 MG TABS  tablet Take 2.5 mg by mouth 2 (two) times daily. (Patient not taking: Reported on 07/30/2020)    . Cranberry 200 MG CAPS Take 1 capsule (200 mg total) by mouth daily. (Patient not taking: No sig reported) 30 capsule 2  . diphenoxylate-atropine (LOMOTIL) 2.5-0.025 MG tablet TAKE 1 TABLET BY MOUTH THREE TIMES DAILYAS NEEDED FOR DIARRHEA (Patient not taking: Reported on 07/30/2020) 90 tablet 5   No facility-administered medications prior to visit.    Review of Systems;  Patient denies headache, fevers, malaise, unintentional weight loss, skin rash, eye pain, sinus congestion and sinus pain, sore throat, dysphagia,  hemoptysis , cough,  dyspnea, wheezing, chest pain, palpitations, orthopnea, edema, abdominal pain, nausea, melena, diarrhea, constipation, flank pain, dysuria, hematuria, urinary  Frequency, nocturia, numbness, tingling, seizures,  Focal weakness, Loss of consciousness,  Tremor, insomnia, depression, anxiety, and suicidal ideation.      Objective:  BP (!) 150/82 (BP Location: Left Arm, Patient Position: Sitting, Cuff Size: Normal)   Pulse (!) 102   Temp 98.1 F (36.7 C) (Oral)   Resp 15   Ht 5\' 2"  (1.575 m)   Wt 127 lb (57.6 kg)   SpO2 97%   BMI 23.23 kg/m   BP Readings from Last 3 Encounters:  07/30/20 (!) 150/82  07/20/20 128/78  07/16/20 (!) 148/71    Wt Readings from Last 3 Encounters:  07/30/20 127 lb (57.6 kg)  07/20/20 127 lb (57.6 kg)  07/13/20 123 lb 10.9 oz (56.1 kg)    General appearance: alert, cooperative and appears stated age Ears: normal TM's and external ear canals both ears Throat: lips, mucosa, and tongue normal; teeth and gums normal Neck: no adenopathy, no carotid bruit, supple, symmetrical, trachea midline and thyroid not enlarged, symmetric, no tenderness/mass/nodules Back: symmetric, no curvature. ROM normal. No CVA tenderness. Buttock: left buttock with pain at SI joint,  Bruising Ext. Left hand swollen ,  Bruised lateral side  Lungs: clear to auscultation bilaterally Heart: regular rate and rhythm, S1, S2 normal, no murmur, click, rub or gallop Abdomen: soft, non-tender; bowel sounds normal; no masses,  no organomegaly Pulses: 2+ and symmetric Skin: Skin color, texture, turgor normal. No rashes or lesions Lymph nodes: Cervical, supraclavicular, and axillary nodes normal.  Lab Results  Component Value Date   HGBA1C 6.3 (H) 07/14/2020   HGBA1C 6.4 07/02/2020   HGBA1C 6.3 12/29/2019    Lab Results  Component Value Date   CREATININE 0.76 07/20/2020   CREATININE 0.77 07/15/2020   CREATININE 0.86 07/14/2020    Lab Results  Component Value Date   WBC 6.6  07/20/2020   HGB 13.2 07/20/2020   HCT 39.7 07/20/2020   PLT 193.0 07/20/2020   GLUCOSE 148 (H) 07/20/2020   CHOL 142 10/11/2013   TRIG 194.0 (H) 10/11/2013   HDL 43.10 10/11/2013   LDLDIRECT 86.3 06/30/2012   LDLCALC 60 10/11/2013   ALT 10 07/20/2020   AST 14 07/20/2020   NA 137 07/20/2020   K 3.9 07/20/2020   CL 100 07/20/2020   CREATININE 0.76 07/20/2020   BUN 23 07/20/2020   CO2 28 07/20/2020   TSH 3.15 12/29/2019   INR 1.1 07/13/2020   HGBA1C 6.3 (H) 07/14/2020   MICROALBUR <0.7 07/02/2020    CT HEAD WO CONTRAST  Result Date: 07/14/2020 CLINICAL DATA:  87 year old female status post fall with posttraumatic shear hemorrhages or hemorrhagic contusions. EXAM: CT HEAD WITHOUT CONTRAST TECHNIQUE: Contiguous axial images were obtained from the base of the skull through the vertex without  intravenous contrast. COMPARISON:  Head CT 07/13/2020. FINDINGS: Brain: There are trace bilateral subdural hematomas or less likely posttraumatic subdural hygromas (coronal image 24 and series 3, image 17. These are more apparent since yesterday. Minimal leftward midline shift is stable. Basilar cisterns remain normal. Several small white matter and gray-white matter junction parenchymal hemorrhages in the anterior left frontal lobe are stable since yesterday. Possible small surface hemorrhagic contusion or trace subarachnoid hemorrhage on series 3, image 20. No significant left frontal lobe edema or mass effect. No new parenchymal hemorrhage. Stable gray-white matter differentiation elsewhere. Trace subarachnoid hemorrhages layering in both sylvian fissures. No intraventricular hemorrhage identified. No ventriculomegaly. No cortically based acute infarct identified. Vascular: Extensive Calcified atherosclerosis at the skull base. Skull: Left orbital floor and nondisplaced posterior left maxillary sinus fractures are visible. No calvarium fracture identified. Sinuses/Orbits: Stable hemorrhage in the left  maxillary sinus. Stable small volume hemorrhage layering in the left frontal sinus. Mild left ethmoid fluid is stable. Right paranasal sinuses, tympanic cavities and mastoids remain clear. Other: Left face and scalp contusion/hematoma. Globes and intraorbital soft tissues remain normal. IMPRESSION: 1. There are trace bilateral subdural hematomas, more apparent than yesterday. Minimal associated leftward midline shift is stable. 2. Several small anterior left frontal lobe shear hemorrhages or hemorrhagic contusions are stable since yesterday. Associated trace subarachnoid hemorrhage not significantly changed. 3. No new intracranial abnormality. 4. Left orbit and maxilla fractures with hemorrhage in the left paranasal sinuses again noted. No calvarium fracture identified. Electronically Signed   By: Genevie Ann M.D.   On: 07/14/2020 07:32   CT HEAD WO CONTRAST  Result Date: 07/13/2020 CLINICAL DATA:  Facial trauma, fall EXAM: CT HEAD WITHOUT CONTRAST CT MAXILLOFACIAL WITHOUT CONTRAST CT CERVICAL SPINE WITHOUT CONTRAST TECHNIQUE: Multidetector CT imaging of the head, cervical spine, and maxillofacial structures were performed using the standard protocol without intravenous contrast. Multiplanar CT image reconstructions of the cervical spine and maxillofacial structures were also generated. COMPARISON:  None. FINDINGS: CT HEAD FINDINGS Brain: Patchy and confluent areas of decreased attenuation are noted throughout the deep and periventricular white matter of the cerebral hemispheres bilaterally, compatible with chronic microvascular ischemic disease. No evidence of large-territorial acute infarction. At least four distinct hyperdense foci within the left frontal lobe measuring 6 x 25mm, 6x4 mm, 2x2 mm, 2x79mm (4:18-21). Possible other punctate foci more superiorly (4:24). These appear to be located at the gray-white matter junction. No definite extra-axial collection. No definite pneumocephalus. No mass effect or midline  shift. No hydrocephalus. Basilar cisterns are patent. Vascular: No hyperdense vessel. Atherosclerotic calcifications are present within the cavernous internal carotid and vertebral arteries. Skull: No acute fracture or focal lesion. Other: None. CT MAXILLOFACIAL FINDINGS Osseous: Markedly comminuted and 6 mm depressed left orbital floor fracture. Comminuted nasal process of bilateral maxilla fractures (6:88-92). This is noted to extend posteriorly on the left side to include a minimally displaced fracture of the anterior wall of the left maxillary sinus (6:77) as well as comminuted fracture of the medial wall of the left maxillary sinus (9:36). Left frontal sinus fracture (10:43) that is minimally displaced. Likely left ethmoid sinus fracture. No definite lamina papyracea fracture. No definite crista galli or cribriform plates bilaterally. Sinuses/Orbits: Air-fluid level with hyperdense fluid within the left maxillary sinus. Air-fluid level within the left frontal sinus and ethmoid sinuses. Otherwise the remaining paranasal sinuses and mastoid air cells are clear. Bilateral lens replacement. Otherwise the orbits are unremarkable. Soft tissues: Soft tissue edema the left periorbital region with associated subcutaneus  soft tissue emphysema and hematoma formation. Soft tissue edema and hematoma extends inferiorly to the left maxillary subcutaneus soft tissues. CT CERVICAL SPINE FINDINGS Alignment: Grade 1 anterolisthesis of C4 on C5, C6 on C7, C7 on T1. Otherwise normal. Skull base and vertebrae: Multilevel degenerative changes of the spine worse at the C5-C6 level. No acute fracture. No aggressive appearing focal osseous lesion or focal pathologic process. Soft tissues and spinal canal: No prevertebral fluid or swelling. No visible canal hematoma. Upper chest: Trace biapical pleural/pulmonary scarring. Other: None. IMPRESSION: 1. At least five distinct subcentimeter foci of intraparenchymal hemorrhage within the left  frontal lobe. Underlying mass lesions not excluded. 2. No large territorial ischemic infarction identified. 3. Markedly comminuted and 6 mm depressed left orbital floor fracture. Inferior rectus muscle may be involved. Recommend clinical correlation for entrapment. 4. Minimally displaced anterior wall and comminuted medial wall fracture of the left maxillary sinus. Associated hemorrhage within the left maxillary sinus. 5. Minimally displaced fractures of the left frontal sinus. Possible left ethmoid sinus fracture. 6. Comminuted nasal process of the maxilla fractures bilaterally. 7. Left cribriform plate fracture not excluded. No pneumocephalus identified. 8. No acute displaced fracture or traumatic listhesis of the cervical spine. These results were called by telephone at the time of interpretation on 07/13/2020 at 4:14 pm to provider Dr. Rex Kras, who verbally acknowledged these results. Electronically Signed   By: Iven Finn M.D.   On: 07/13/2020 16:26   CT CERVICAL SPINE WO CONTRAST  Result Date: 07/13/2020 CLINICAL DATA:  Facial trauma, fall EXAM: CT HEAD WITHOUT CONTRAST CT MAXILLOFACIAL WITHOUT CONTRAST CT CERVICAL SPINE WITHOUT CONTRAST TECHNIQUE: Multidetector CT imaging of the head, cervical spine, and maxillofacial structures were performed using the standard protocol without intravenous contrast. Multiplanar CT image reconstructions of the cervical spine and maxillofacial structures were also generated. COMPARISON:  None. FINDINGS: CT HEAD FINDINGS Brain: Patchy and confluent areas of decreased attenuation are noted throughout the deep and periventricular white matter of the cerebral hemispheres bilaterally, compatible with chronic microvascular ischemic disease. No evidence of large-territorial acute infarction. At least four distinct hyperdense foci within the left frontal lobe measuring 6 x 75mm, 6x4 mm, 2x2 mm, 2x83mm (4:18-21). Possible other punctate foci more superiorly (4:24). These appear to  be located at the gray-white matter junction. No definite extra-axial collection. No definite pneumocephalus. No mass effect or midline shift. No hydrocephalus. Basilar cisterns are patent. Vascular: No hyperdense vessel. Atherosclerotic calcifications are present within the cavernous internal carotid and vertebral arteries. Skull: No acute fracture or focal lesion. Other: None. CT MAXILLOFACIAL FINDINGS Osseous: Markedly comminuted and 6 mm depressed left orbital floor fracture. Comminuted nasal process of bilateral maxilla fractures (6:88-92). This is noted to extend posteriorly on the left side to include a minimally displaced fracture of the anterior wall of the left maxillary sinus (6:77) as well as comminuted fracture of the medial wall of the left maxillary sinus (9:36). Left frontal sinus fracture (10:43) that is minimally displaced. Likely left ethmoid sinus fracture. No definite lamina papyracea fracture. No definite crista galli or cribriform plates bilaterally. Sinuses/Orbits: Air-fluid level with hyperdense fluid within the left maxillary sinus. Air-fluid level within the left frontal sinus and ethmoid sinuses. Otherwise the remaining paranasal sinuses and mastoid air cells are clear. Bilateral lens replacement. Otherwise the orbits are unremarkable. Soft tissues: Soft tissue edema the left periorbital region with associated subcutaneus soft tissue emphysema and hematoma formation. Soft tissue edema and hematoma extends inferiorly to the left maxillary subcutaneus soft tissues. CT  CERVICAL SPINE FINDINGS Alignment: Grade 1 anterolisthesis of C4 on C5, C6 on C7, C7 on T1. Otherwise normal. Skull base and vertebrae: Multilevel degenerative changes of the spine worse at the C5-C6 level. No acute fracture. No aggressive appearing focal osseous lesion or focal pathologic process. Soft tissues and spinal canal: No prevertebral fluid or swelling. No visible canal hematoma. Upper chest: Trace biapical  pleural/pulmonary scarring. Other: None. IMPRESSION: 1. At least five distinct subcentimeter foci of intraparenchymal hemorrhage within the left frontal lobe. Underlying mass lesions not excluded. 2. No large territorial ischemic infarction identified. 3. Markedly comminuted and 6 mm depressed left orbital floor fracture. Inferior rectus muscle may be involved. Recommend clinical correlation for entrapment. 4. Minimally displaced anterior wall and comminuted medial wall fracture of the left maxillary sinus. Associated hemorrhage within the left maxillary sinus. 5. Minimally displaced fractures of the left frontal sinus. Possible left ethmoid sinus fracture. 6. Comminuted nasal process of the maxilla fractures bilaterally. 7. Left cribriform plate fracture not excluded. No pneumocephalus identified. 8. No acute displaced fracture or traumatic listhesis of the cervical spine. These results were called by telephone at the time of interpretation on 07/13/2020 at 4:14 pm to provider Dr. Rex Kras, who verbally acknowledged these results. Electronically Signed   By: Iven Finn M.D.   On: 07/13/2020 16:26   DG Pelvis Portable  Result Date: 07/13/2020 CLINICAL DATA:  Pain following fall EXAM: PORTABLE PELVIS 1-2 VIEWS COMPARISON:  None. FINDINGS: Frontal pelvis obtained. There is screw and nail fixation in the proximal left femur with alignment at the previous fracture site essentially anatomic. Screw tip in proximal femoral head. There is no appreciable acute fracture or dislocation. There is moderate symmetric narrowing of each hip joint. There is spurring in the pubic symphysis region. No erosion. There is extensive common femoral, superficial femoral, and profunda femoral artery calcification bilaterally. There is degenerative change in the lower lumbar spine. IMPRESSION: Postoperative change proximal left femur. No acute fracture or dislocation. Symmetric moderate narrowing of each hip joint. Degenerative change  in lower lumbar spine. Multiple foci of arterial vascular calcification noted. Electronically Signed   By: Lowella Grip III M.D.   On: 07/13/2020 15:38   DG Hand 2 View Left  Result Date: 07/13/2020 CLINICAL DATA:  Pain following fall EXAM: LEFT HAND - 2 VIEW COMPARISON:  None. FINDINGS: Frontal and lateral views were obtained. No fracture or dislocation. There is osteoarthritic change in all PIP and DIP joints as well as in the first IP joint. No erosive change. There are multiple foci of arterial vascular calcification. IMPRESSION: No acute fracture or dislocation. 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 Foci of vascular calcification noted at several sites. Electronically Signed   By: Lowella Grip III M.D.   On: 07/13/2020 15:40   DG Chest Port 1 View  Result Date: 07/13/2020 CLINICAL DATA:  Pain following fall EXAM: PORTABLE CHEST 1 VIEW COMPARISON:  None FINDINGS: Lungs are clear. Heart is mildly enlarged with pacemaker leads attached to the right atrium and right ventricle. No adenopathy. There is aortic atherosclerosis. No pneumothorax. No bone lesions. IMPRESSION: Cardiomegaly with pacemaker leads attached to right atrium and right ventricle. Lungs clear. No pneumothorax. Aortic Atherosclerosis (ICD10-I70.0). Electronically Signed   By: Lowella Grip III M.D.   On: 07/13/2020 15:39   CT MAXILLOFACIAL WO CONTRAST  Result Date: 07/13/2020 CLINICAL DATA:  Facial trauma, fall EXAM: CT HEAD WITHOUT CONTRAST CT MAXILLOFACIAL WITHOUT CONTRAST CT CERVICAL SPINE WITHOUT CONTRAST TECHNIQUE: Multidetector CT imaging of  the head, cervical spine, and maxillofacial structures were performed using the standard protocol without intravenous contrast. Multiplanar CT image reconstructions of the cervical spine and maxillofacial structures were also generated. COMPARISON:  None. FINDINGS: CT HEAD FINDINGS Brain: Patchy and confluent areas of decreased attenuation are noted throughout the deep  and periventricular white matter of the cerebral hemispheres bilaterally, compatible with chronic microvascular ischemic disease. No evidence of large-territorial acute infarction. At least four distinct hyperdense foci within the left frontal lobe measuring 6 x 62mm, 6x4 mm, 2x2 mm, 2x65mm (4:18-21). Possible other punctate foci more superiorly (4:24). These appear to be located at the gray-white matter junction. No definite extra-axial collection. No definite pneumocephalus. No mass effect or midline shift. No hydrocephalus. Basilar cisterns are patent. Vascular: No hyperdense vessel. Atherosclerotic calcifications are present within the cavernous internal carotid and vertebral arteries. Skull: No acute fracture or focal lesion. Other: None. CT MAXILLOFACIAL FINDINGS Osseous: Markedly comminuted and 6 mm depressed left orbital floor fracture. Comminuted nasal process of bilateral maxilla fractures (6:88-92). This is noted to extend posteriorly on the left side to include a minimally displaced fracture of the anterior wall of the left maxillary sinus (6:77) as well as comminuted fracture of the medial wall of the left maxillary sinus (9:36). Left frontal sinus fracture (10:43) that is minimally displaced. Likely left ethmoid sinus fracture. No definite lamina papyracea fracture. No definite crista galli or cribriform plates bilaterally. Sinuses/Orbits: Air-fluid level with hyperdense fluid within the left maxillary sinus. Air-fluid level within the left frontal sinus and ethmoid sinuses. Otherwise the remaining paranasal sinuses and mastoid air cells are clear. Bilateral lens replacement. Otherwise the orbits are unremarkable. Soft tissues: Soft tissue edema the left periorbital region with associated subcutaneus soft tissue emphysema and hematoma formation. Soft tissue edema and hematoma extends inferiorly to the left maxillary subcutaneus soft tissues. CT CERVICAL SPINE FINDINGS Alignment: Grade 1 anterolisthesis  of C4 on C5, C6 on C7, C7 on T1. Otherwise normal. Skull base and vertebrae: Multilevel degenerative changes of the spine worse at the C5-C6 level. No acute fracture. No aggressive appearing focal osseous lesion or focal pathologic process. Soft tissues and spinal canal: No prevertebral fluid or swelling. No visible canal hematoma. Upper chest: Trace biapical pleural/pulmonary scarring. Other: None. IMPRESSION: 1. At least five distinct subcentimeter foci of intraparenchymal hemorrhage within the left frontal lobe. Underlying mass lesions not excluded. 2. No large territorial ischemic infarction identified. 3. Markedly comminuted and 6 mm depressed left orbital floor fracture. Inferior rectus muscle may be involved. Recommend clinical correlation for entrapment. 4. Minimally displaced anterior wall and comminuted medial wall fracture of the left maxillary sinus. Associated hemorrhage within the left maxillary sinus. 5. Minimally displaced fractures of the left frontal sinus. Possible left ethmoid sinus fracture. 6. Comminuted nasal process of the maxilla fractures bilaterally. 7. Left cribriform plate fracture not excluded. No pneumocephalus identified. 8. No acute displaced fracture or traumatic listhesis of the cervical spine. These results were called by telephone at the time of interpretation on 07/13/2020 at 4:14 pm to provider Dr. Rex Kras, who verbally acknowledged these results. Electronically Signed   By: Iven Finn M.D.   On: 07/13/2020 16:26    Assessment & Plan:   Problem List Items Addressed This Visit      Unprioritized   Left buttock pain    SI joint fracture suspected.  Plain films ordered. Tylenol with codien  Adding meloxicam 7.5 mg daily       Relevant Orders   DG Hip  Unilat W OR W/O Pelvis 2-3 Views Left   Left hand pain - Primary    reimaging today given extensive swelling along lateral side.       Relevant Orders   DG Hip Unilat W OR W/O Pelvis 2-3 Views Left   DG Hand  Complete Left   History of recent fall    She is at increased risk of recurrent fall due to current state.  FL-2 signed for temporary placement in [facility] rehab floor          I have discontinued Daleen Squibb. Daniello's apixaban and Cranberry. I am also having her start on meloxicam. Additionally, I am having her maintain her cholecalciferol, estradiol, clobetasol cream, metoprolol succinate, cyanocobalamin, triamcinolone, diphenoxylate-atropine, metoprolol succinate, Dermacloud, and acetaminophen-codeine.  Meds ordered this encounter  Medications  . meloxicam (MOBIC) 7.5 MG tablet    Sig: Take 1 tablet (7.5 mg total) by mouth daily.    Dispense:  30 tablet    Refill:  0  . acetaminophen-codeine (TYLENOL #3) 300-30 MG tablet    Sig: Take 1-2 tablets by mouth every 6 (six) hours as needed for moderate pain. REPLACES TRAMADOL RX SENT IN TODAY    Dispense:  60 tablet    Refill:  0    PLEASE DELIVER ALONG WITH MELOXICAM TO [facility]    Medications Discontinued During This Encounter  Medication Reason  . Cranberry 200 MG CAPS   . apixaban (ELIQUIS) 2.5 MG TABS tablet   . diphenoxylate-atropine (LOMOTIL) 2.5-0.025 MG tablet   . acetaminophen-codeine (TYLENOL #3) 300-30 MG tablet     Follow-up: No follow-ups on file.   Crecencio Mc, MD

## 2020-07-31 DIAGNOSIS — M25552 Pain in left hip: Secondary | ICD-10-CM | POA: Diagnosis not present

## 2020-07-31 DIAGNOSIS — S0636AA Traumatic hemorrhage of cerebrum, unspecified, with loss of consciousness status unknown, initial encounter: Secondary | ICD-10-CM | POA: Insufficient documentation

## 2020-07-31 DIAGNOSIS — S06369D Traumatic hemorrhage of cerebrum, unspecified, with loss of consciousness of unspecified duration, subsequent encounter: Secondary | ICD-10-CM | POA: Diagnosis not present

## 2020-07-31 DIAGNOSIS — I4819 Other persistent atrial fibrillation: Secondary | ICD-10-CM | POA: Diagnosis not present

## 2020-08-02 ENCOUNTER — Other Ambulatory Visit: Payer: Self-pay | Admitting: Internal Medicine

## 2020-08-02 DIAGNOSIS — S62398D Other fracture of other metacarpal bone, subsequent encounter for fracture with routine healing: Secondary | ICD-10-CM

## 2020-08-02 DIAGNOSIS — M7918 Myalgia, other site: Secondary | ICD-10-CM

## 2020-08-02 DIAGNOSIS — H353131 Nonexudative age-related macular degeneration, bilateral, early dry stage: Secondary | ICD-10-CM | POA: Diagnosis not present

## 2020-08-03 ENCOUNTER — Ambulatory Visit: Payer: Medicare Other | Admitting: *Deleted

## 2020-08-03 DIAGNOSIS — Z9181 History of falling: Secondary | ICD-10-CM

## 2020-08-03 DIAGNOSIS — S62398D Other fracture of other metacarpal bone, subsequent encounter for fracture with routine healing: Secondary | ICD-10-CM

## 2020-08-03 DIAGNOSIS — S0285XA Fracture of orbit, unspecified, initial encounter for closed fracture: Secondary | ICD-10-CM

## 2020-08-03 DIAGNOSIS — I619 Nontraumatic intracerebral hemorrhage, unspecified: Secondary | ICD-10-CM

## 2020-08-03 DIAGNOSIS — S06300D Unspecified focal traumatic brain injury without loss of consciousness, subsequent encounter: Secondary | ICD-10-CM

## 2020-08-03 DIAGNOSIS — I10 Essential (primary) hypertension: Secondary | ICD-10-CM

## 2020-08-03 NOTE — Chronic Care Management (AMB) (Signed)
Chronic Care Management   CCM RN Visit Note  08/03/2020 Name: Kara [name] MRN: [id number] DOB: 12/31/1922  Subjective: Kara [name] is a 87 y.o. year old female who is a primary care patient of Pcp, No. The care management team was consulted for assistance with disease management and care coordination needs.    Unable to engage pateint at this time for follow up visit in response to provider referral for case management and/or care coordination services.   Consent to Services:  Unable to engage patient at this time  Patient agreed to services and verbal consent obtained.   Assessment: Review of patient past medical history, allergies, medications, health status, including review of consultants reports, laboratory and other test data, was performed as part of comprehensive evaluation and provision of chronic care management services.   SDOH (Social Determinants of Health) assessments and interventions performed:    CCM Care Plan  Allergies  Allergen Reactions  . Baycol [Cerivastatin Sodium]   . Cardizem [Diltiazem Hcl]     LOWER EXTREMITY EDEMA  . Crestor [Rosuvastatin Calcium] Other (See Comments)    INTOLERANT  . Dronedarone Other (See Comments)    Fatigue  . Metoprolol Tartrate Other (See Comments)  . Omeprazole Other (See Comments)  . Pravachol     INTOLERANT  . Statins Other (See Comments)  . Vioxx [Rofecoxib]   . Zocor [Simvastatin]     INTOLERANT    Outpatient Encounter Medications as of 08/03/2020  Medication Sig Note  . acetaminophen-codeine (TYLENOL #3) 300-30 MG tablet Take 1-2 tablets by mouth every 6 (six) hours as needed for moderate pain. REPLACES TRAMADOL RX SENT IN TODAY   . cholecalciferol (VITAMIN D) 1000 UNITS tablet Take 1,000 Units by mouth daily. 07/23/2020: Reports not taking in a while  . clobetasol cream (TEMOVATE) 0.05 % Apply topically.   . cyanocobalamin (,VITAMIN B-12,) 1000 MCG/ML injection INJECT 1ML INTO THE MUSCLE EVERY 14 DAYS   .  diphenoxylate-atropine (LOMOTIL) 2.5-0.025 MG tablet Take 1 tablet by mouth 4 (four) times daily as needed for diarrhea or loose stools.   Marland Kitchen estradiol (ESTRACE) 0.1 MG/GM vaginal cream Place vaginally.   . Infant Care Products (DERMACLOUD) OINT Apply 1 application topically as needed.   . meloxicam (MOBIC) 7.5 MG tablet Take 1 tablet (7.5 mg total) by mouth daily.   . metoprolol succinate (TOPROL-XL) 25 MG 24 hr tablet Take 1 tablet (25 mg total) by mouth in the morning and at bedtime.   . metoprolol succinate (TOPROL-XL) 25 MG 24 hr tablet Take 25 mg by mouth in the morning and at bedtime. 01/19/9029: duplicate  . triamcinolone (KENALOG) 0.1 % APPLY 1 APPLICATION TOPICALLY 2 TIMES DAILY TO VULVA UNTIL ITCHING RESOLVES THEN REDUCE USE TO TWICE WEEKLY.    No facility-administered encounter medications on file as of 08/03/2020.    Patient Active Problem List   Diagnosis Date Noted  . Left buttock pain 07/30/2020  . Left hand pain 07/30/2020  . History of recent fall 07/30/2020  . Hospital discharge follow-up 07/22/2020  . Intraparenchymal hemorrhage of brain ([place]) 07/13/2020  . Syncope and collapse 07/13/2020  . Facial fracture due to fall ([place]) 07/13/2020  . Orbital fracture ([place]) 07/13/2020  . Hypokalemia 07/13/2020  . Atrial fibrillation, chronic ([place]) 07/13/2020  . HTN (hypertension) 07/13/2020  . Type 2 diabetes mellitus ([place]) 07/13/2020  . DNR (do not resuscitate) 07/13/2020  . S/P placement of cardiac pacemaker 07/03/2020  . Myalgia due to statin 07/03/2020  . Aortic atherosclerosis (  [place]) 07/03/2020  . Displaced intertrochanteric fracture of left femur, sequela 06/25/2019  . Age-related osteoporosis with current pathological fracture with routine healing 06/15/2019  . Sciatica of right side 02/18/2019  . Vaginitis and vulvovaginitis 12/26/2018  . Educated about COVID-19 virus infection 10/12/2018  . Edema of lower extremity 11/24/2017  . Exertional dyspnea 06/16/2017  . Venous  insufficiency of both lower extremities 11/24/2016  . Essential hypertension 11/11/2016  . Peripheral artery disease ([place]) 11/11/2016  . Chronic pulmonary hypertension ([place]) 10/20/2016  . Leg swelling 06/17/2016  . Type 2 diabetes mellitus with diabetic neuropathy, unspecified ([place]) 06/17/2016  . Urinary incontinence, urge 12/27/2015  . Atrophic vaginitis 09/26/2015  . Severe tricuspid valve insufficiency 07/04/2015  . Umbilical hernia without obstruction and without gangrene 06/26/2015  . Carpal tunnel syndrome of right wrist 05/29/2015  . Chronic fatigue 05/01/2015  . Vaginal atrophy 11/21/2014  . Urethral caruncle 11/21/2014  . Mitral valve regurgitation 05/09/2014  . Fatigue 03/09/2014  . SSS (sick sinus syndrome) ([place]) 10/11/2013  . Encounter for current long-term use of anticoagulants 10/06/2012  . Osteoarthritis 03/08/2012  . Screening for breast cancer 03/08/2012  . Pernicious anemia 02/04/2012  . History of colectomy 02/04/2012  . Vitamin D deficiency 07/30/2011  . Acquired thrombophilia ([place]) 06/13/2011  . Coronary atherosclerosis due to calcified coronary lesion 06/13/2011  . Chronic diarrhea 06/13/2011    Conditions to be addressed/monitored:HTN and Falls  Care Plan : Hypertension (Adult)  Updates made by Leona Singleton, RN since 08/03/2020 12:00 AM  Problem: Hypertension (Hypertension)   Goal: Hypertension Monitored   Start Date: 07/23/2020  Expected End Date: 01/15/2021  Priority: Medium  Current Barriers:  Marland Kitchen Knowledge Deficits related to basic understanding of hypertension pathophysiology and self care management as evidenced by patient stating she does not monitor blood pressures at home.  Discussed recent fall with intracranial hemorrhage.  Patient denies any current headache or signs or symptoms of hypertension.  Does state the nurses could monitor blood pressures at [facility], but feels she is unable to get down to nursing station at this  time.  Discussed her asking the therapist to check blood pressures prior to therapy sessions until she can safely get to nurses station for blood pressure checks.  ----Upon chart review noted patient was admitted to [facility] for [facility] on 07/31/20 due to increasing pain, inability to care for self and left 5th finger fracture; attempted outreach to patient, unsuccessful, message left---- . Unable to perform ADLs independently . Unable to perform IADLs independently Nurse Case Manager Clinical Goal(s):  Marland Kitchen Over the next 90 days, patient will verbalize understanding of plan for hypertension management . Over the next 120 days, patient will demonstrate improved adherence to prescribed treatment plan for hypertension as evidenced by taking all medications as prescribed, monitoring and recording blood pressure as directed (twice a week), adhering to low sodium/DASH diet Interventions:  Patient was not interviewed or contacted during this encounter. Review of patient past medical history, allergies, medications, and health status, including review of pertinent consultant reports was performed as part of comprehensive evaluation and provision of care management/care coordination services. . Collaboration with Deborra Medina regarding development and update of comprehensive plan of care as evidenced by provider attestation and co-signature . Inter-disciplinary care team collaboration (see longitudinal plan of care) . Evaluation of current treatment plan related to hypertension self management and patient's adherence to plan as established by provider. . Provided education to patient re: stroke prevention, s/s of heart attack  and stroke, DASH diet, complications of uncontrolled blood pressure . Reviewed medications with patient and discussed importance of compliance . Discussed plans with patient for ongoing care management follow up and provided patient with direct contact information for care management  team . Advised patient, providing education and rationale, to monitor blood pressure twice a week and record, calling PCP for findings outside established parameters.  . Reviewed scheduled/upcoming provider appointments including: Cardiology appointment on 07/24/2020 . Encouraged patient to request blood pressure checks by therapist prior to therapy sessions and to write readings down for provider review until she is able to go down to nursing station to have nurses check blood pressures . Sending 2022 Calendar Booklet to help document blood pressures Patient Goals/Self-Care Activities:  Over the next 30 days, patient will: . Check blood pressure at least 2 times per week . Write blood pressure results in a log for provider review  . Request therapist to check blood pressures prior to therapy sessions . When able to make it down to nursing desk, request nursing staff to check blood pressures at least twice a week and keep log for provider review Follow Up Plan:  RNCM will monitor chart for timing of possible discharge home from rehab facility.     Care Plan : Fall Risk (Adult)  Updates made by Leona Singleton, RN since 08/03/2020 12:00 AM  Problem: Increase fall risk related to resent fall with orbital and facial  fractures   Priority: High  Goal: Patient will report no more falls within the next 45 days   Start Date: 07/23/2020  Expected End Date: 09/06/2020  Priority: High  Current Barriers:  Marland Kitchen Knowledge Deficits related to fall precautions in patient with recent fall resulting in facial and orbital fractures and intracranial hemorrhage.  Patient reports she was rushing to play bridge and she is not sure if she tripped or lost balance, but ended up hitting floor hard.  States she continues to be sore on her left side (arm, side, back, leg).  Denies any headaches at this time.  Reports taking Tylenol and Tramadol for pain (would like to know if she could take a whole tablet of Tramadol-OK per Dr.  Derrel Nip).  Denies any confusion or increase in drowsiness with taking half tablet of Tramadol.  Patient reporting she returned home, to her independent living apartment (did not need to go to higher level of care) with the assistance of her daughter, whom is here from out of state.  States she is able to ambulate to bathroom with Rolator.  Does report home therapy has started and they are scheduled to come out again tomorrow.      -----Upon chart review noted patient was admitted to [facility] for rehab on 07/31/20 due to increasing pain, inability to care for self and left 5th finger fracture; attempted outreach to patient, unsuccessful, message left---- . Decreased adherence to prescribed treatment for fall prevention . Unable to perform ADLs independently at this time, needing assistance . Unable to perform IADLs independently at this time needing assistance Clinical Goal(s):  . patient will demonstrate improved adherence to prescribed treatment plan for decreasing falls as evidenced by patient reporting no falls in the next 45 days and review of EMR . patient will verbalize using fall risk reduction strategies discussed . patient will not experience additional falls . patient will work with home health therapy to address needs related to fall precautions and increasing strength Interventions: Patient was not interviewed or contacted during  this encounter. Review of patient past medical history, allergies, medications, and health status, including review of pertinent consultant reports was performed as part of comprehensive evaluation and provision of care management/care coordination services. . Collaboration with Deborra Medina regarding development and update of comprehensive plan of care as evidenced by provider attestation and co-signature . Inter-disciplinary care team collaboration (see longitudinal plan of care) . Provided written and verbal education re: Potential causes of falls and Fall  prevention strategies . Reviewed medications and discussed potential side effects of medications such as dizziness and frequent urination . Discussed with Dr. Derrel Nip patient requesting to take whole Tramadol instead of half tablet (ok per MD instructions) . Instructed patient Dr. Derrel Nip ok with taking whole Tramadol tablet but to monitor for any side effects or increase drowsiness, light headiness, or dizziness . Assessed patients knowledge of fall risk prevention  . Reviewed scheduled/upcoming provider appointments including: Cardiology appointment on 07/24/20 . Assistive or adaptive device use encouraged, discussed and encouraged use of walker with all ambulation . Cognition assessed . Fall prevention plan reviewed and updated . Fear of falling, loss of independence and pain acknowledged . Participation in rehabilitation therapy encouraged, encouraged to increase activity slowly as tolerated . Discussed use of heat and cold to help with pain . Encouraged to do range of motion exercises while sitting . Confirmed patient knows not to take Eliquis per provider instruction Patient Goals/Self-Care Deficits:   . Call the doctor if I am feeling too drowsy with pain medication . Keep my cell phone with me always . Make an emergency alert plan in case I fall . Use a walker with all ambulation . Attend therapy  (work with home therapist) . Do range of motion exercises while sitting (flexing and stretching arms and legs) to help with stiffness . Take pain medication prior to therapy session . Take time and move slowly; try not to rush Follow Up Plan: RNCM will monitor chart for timing of possible discharge home from rehab facility.     Plan:RNCM will monitor chart for timing of discharge to home from rehab facility   [place], MSN RN Care Management Coordinator [place] [phone] [email or web address]

## 2020-08-07 ENCOUNTER — Telehealth: Payer: Self-pay

## 2020-08-07 NOTE — Telephone Encounter (Signed)
LMTCB with [facility].

## 2020-08-07 NOTE — Telephone Encounter (Signed)
Pt returned your call.  

## 2020-08-07 NOTE — Telephone Encounter (Signed)
Spoke with pt and informed her of her xray results. Pt stated that she has an appt with ortho on Thursday about her hand fracture. Dr. Derrel Nip is aware of the appt.

## 2020-08-09 DIAGNOSIS — R29898 Other symptoms and signs involving the musculoskeletal system: Secondary | ICD-10-CM | POA: Diagnosis not present

## 2020-08-09 DIAGNOSIS — S62307A Unspecified fracture of fifth metacarpal bone, left hand, initial encounter for closed fracture: Secondary | ICD-10-CM | POA: Diagnosis not present

## 2020-08-10 DIAGNOSIS — S0282XD Fracture of other specified skull and facial bones, left side, subsequent encounter for fracture with routine healing: Secondary | ICD-10-CM | POA: Diagnosis not present

## 2020-08-10 DIAGNOSIS — M25542 Pain in joints of left hand: Secondary | ICD-10-CM | POA: Diagnosis not present

## 2020-08-10 DIAGNOSIS — R41841 Cognitive communication deficit: Secondary | ICD-10-CM | POA: Diagnosis not present

## 2020-08-10 DIAGNOSIS — Z9181 History of falling: Secondary | ICD-10-CM | POA: Diagnosis not present

## 2020-08-10 DIAGNOSIS — S62307D Unspecified fracture of fifth metacarpal bone, left hand, subsequent encounter for fracture with routine healing: Secondary | ICD-10-CM | POA: Diagnosis not present

## 2020-08-10 DIAGNOSIS — R262 Difficulty in walking, not elsewhere classified: Secondary | ICD-10-CM | POA: Diagnosis not present

## 2020-08-10 DIAGNOSIS — R2689 Other abnormalities of gait and mobility: Secondary | ICD-10-CM | POA: Diagnosis not present

## 2020-08-10 DIAGNOSIS — S0282XS Fracture of other specified skull and facial bones, left side, sequela: Secondary | ICD-10-CM | POA: Diagnosis not present

## 2020-08-10 DIAGNOSIS — M6281 Muscle weakness (generalized): Secondary | ICD-10-CM | POA: Diagnosis not present

## 2020-08-11 DIAGNOSIS — M6281 Muscle weakness (generalized): Secondary | ICD-10-CM | POA: Diagnosis not present

## 2020-08-11 DIAGNOSIS — S0282XD Fracture of other specified skull and facial bones, left side, subsequent encounter for fracture with routine healing: Secondary | ICD-10-CM | POA: Diagnosis not present

## 2020-08-11 DIAGNOSIS — S0282XS Fracture of other specified skull and facial bones, left side, sequela: Secondary | ICD-10-CM | POA: Diagnosis not present

## 2020-08-11 DIAGNOSIS — S62307D Unspecified fracture of fifth metacarpal bone, left hand, subsequent encounter for fracture with routine healing: Secondary | ICD-10-CM | POA: Diagnosis not present

## 2020-08-11 DIAGNOSIS — R41841 Cognitive communication deficit: Secondary | ICD-10-CM | POA: Diagnosis not present

## 2020-08-11 DIAGNOSIS — Z9181 History of falling: Secondary | ICD-10-CM | POA: Diagnosis not present

## 2020-08-15 DIAGNOSIS — Z9181 History of falling: Secondary | ICD-10-CM | POA: Diagnosis not present

## 2020-08-15 DIAGNOSIS — S62307D Unspecified fracture of fifth metacarpal bone, left hand, subsequent encounter for fracture with routine healing: Secondary | ICD-10-CM | POA: Diagnosis not present

## 2020-08-15 DIAGNOSIS — S0282XD Fracture of other specified skull and facial bones, left side, subsequent encounter for fracture with routine healing: Secondary | ICD-10-CM | POA: Diagnosis not present

## 2020-08-15 DIAGNOSIS — S0282XS Fracture of other specified skull and facial bones, left side, sequela: Secondary | ICD-10-CM | POA: Diagnosis not present

## 2020-08-15 DIAGNOSIS — M6281 Muscle weakness (generalized): Secondary | ICD-10-CM | POA: Diagnosis not present

## 2020-08-15 DIAGNOSIS — R41841 Cognitive communication deficit: Secondary | ICD-10-CM | POA: Diagnosis not present

## 2020-08-18 DIAGNOSIS — M6281 Muscle weakness (generalized): Secondary | ICD-10-CM | POA: Diagnosis not present

## 2020-08-18 DIAGNOSIS — R41841 Cognitive communication deficit: Secondary | ICD-10-CM | POA: Diagnosis not present

## 2020-08-18 DIAGNOSIS — S62307D Unspecified fracture of fifth metacarpal bone, left hand, subsequent encounter for fracture with routine healing: Secondary | ICD-10-CM | POA: Diagnosis not present

## 2020-08-18 DIAGNOSIS — R2689 Other abnormalities of gait and mobility: Secondary | ICD-10-CM | POA: Diagnosis not present

## 2020-08-18 DIAGNOSIS — S0282XS Fracture of other specified skull and facial bones, left side, sequela: Secondary | ICD-10-CM | POA: Diagnosis not present

## 2020-08-18 DIAGNOSIS — R262 Difficulty in walking, not elsewhere classified: Secondary | ICD-10-CM | POA: Diagnosis not present

## 2020-08-18 DIAGNOSIS — Z9181 History of falling: Secondary | ICD-10-CM | POA: Diagnosis not present

## 2020-08-18 DIAGNOSIS — S0282XD Fracture of other specified skull and facial bones, left side, subsequent encounter for fracture with routine healing: Secondary | ICD-10-CM | POA: Diagnosis not present

## 2020-08-18 DIAGNOSIS — M25542 Pain in joints of left hand: Secondary | ICD-10-CM | POA: Diagnosis not present

## 2020-08-20 DIAGNOSIS — S62307D Unspecified fracture of fifth metacarpal bone, left hand, subsequent encounter for fracture with routine healing: Secondary | ICD-10-CM | POA: Diagnosis not present

## 2020-08-20 DIAGNOSIS — S0282XS Fracture of other specified skull and facial bones, left side, sequela: Secondary | ICD-10-CM | POA: Diagnosis not present

## 2020-08-20 DIAGNOSIS — R41841 Cognitive communication deficit: Secondary | ICD-10-CM | POA: Diagnosis not present

## 2020-08-20 DIAGNOSIS — M6281 Muscle weakness (generalized): Secondary | ICD-10-CM | POA: Diagnosis not present

## 2020-08-20 DIAGNOSIS — Z9181 History of falling: Secondary | ICD-10-CM | POA: Diagnosis not present

## 2020-08-20 DIAGNOSIS — S0282XD Fracture of other specified skull and facial bones, left side, subsequent encounter for fracture with routine healing: Secondary | ICD-10-CM | POA: Diagnosis not present

## 2020-08-22 DIAGNOSIS — S72142A Displaced intertrochanteric fracture of left femur, initial encounter for closed fracture: Secondary | ICD-10-CM | POA: Diagnosis not present

## 2020-08-22 DIAGNOSIS — R41841 Cognitive communication deficit: Secondary | ICD-10-CM | POA: Diagnosis not present

## 2020-08-22 DIAGNOSIS — S0282XD Fracture of other specified skull and facial bones, left side, subsequent encounter for fracture with routine healing: Secondary | ICD-10-CM | POA: Diagnosis not present

## 2020-08-22 DIAGNOSIS — M6281 Muscle weakness (generalized): Secondary | ICD-10-CM | POA: Diagnosis not present

## 2020-08-22 DIAGNOSIS — S300XXA Contusion of lower back and pelvis, initial encounter: Secondary | ICD-10-CM | POA: Insufficient documentation

## 2020-08-22 DIAGNOSIS — S62307D Unspecified fracture of fifth metacarpal bone, left hand, subsequent encounter for fracture with routine healing: Secondary | ICD-10-CM | POA: Diagnosis not present

## 2020-08-22 DIAGNOSIS — S0282XS Fracture of other specified skull and facial bones, left side, sequela: Secondary | ICD-10-CM | POA: Diagnosis not present

## 2020-08-22 DIAGNOSIS — Z9181 History of falling: Secondary | ICD-10-CM | POA: Diagnosis not present

## 2020-08-22 HISTORY — DX: Displaced intertrochanteric fracture of left femur, initial encounter for closed fracture: S72.142A

## 2020-08-23 DIAGNOSIS — M6281 Muscle weakness (generalized): Secondary | ICD-10-CM | POA: Diagnosis not present

## 2020-08-23 DIAGNOSIS — S62307D Unspecified fracture of fifth metacarpal bone, left hand, subsequent encounter for fracture with routine healing: Secondary | ICD-10-CM | POA: Diagnosis not present

## 2020-08-23 DIAGNOSIS — S0282XD Fracture of other specified skull and facial bones, left side, subsequent encounter for fracture with routine healing: Secondary | ICD-10-CM | POA: Diagnosis not present

## 2020-08-23 DIAGNOSIS — S0282XS Fracture of other specified skull and facial bones, left side, sequela: Secondary | ICD-10-CM | POA: Diagnosis not present

## 2020-08-23 DIAGNOSIS — Z9181 History of falling: Secondary | ICD-10-CM | POA: Diagnosis not present

## 2020-08-23 DIAGNOSIS — R41841 Cognitive communication deficit: Secondary | ICD-10-CM | POA: Diagnosis not present

## 2020-08-24 ENCOUNTER — Other Ambulatory Visit: Payer: Self-pay | Admitting: Internal Medicine

## 2020-08-27 DIAGNOSIS — S0282XS Fracture of other specified skull and facial bones, left side, sequela: Secondary | ICD-10-CM | POA: Diagnosis not present

## 2020-08-27 DIAGNOSIS — S0282XD Fracture of other specified skull and facial bones, left side, subsequent encounter for fracture with routine healing: Secondary | ICD-10-CM | POA: Diagnosis not present

## 2020-08-27 DIAGNOSIS — M6281 Muscle weakness (generalized): Secondary | ICD-10-CM | POA: Diagnosis not present

## 2020-08-27 DIAGNOSIS — Z9181 History of falling: Secondary | ICD-10-CM | POA: Diagnosis not present

## 2020-08-27 DIAGNOSIS — S62307D Unspecified fracture of fifth metacarpal bone, left hand, subsequent encounter for fracture with routine healing: Secondary | ICD-10-CM | POA: Diagnosis not present

## 2020-08-27 DIAGNOSIS — R41841 Cognitive communication deficit: Secondary | ICD-10-CM | POA: Diagnosis not present

## 2020-08-29 DIAGNOSIS — S0282XD Fracture of other specified skull and facial bones, left side, subsequent encounter for fracture with routine healing: Secondary | ICD-10-CM | POA: Diagnosis not present

## 2020-08-29 DIAGNOSIS — R41841 Cognitive communication deficit: Secondary | ICD-10-CM | POA: Diagnosis not present

## 2020-08-29 DIAGNOSIS — S62307D Unspecified fracture of fifth metacarpal bone, left hand, subsequent encounter for fracture with routine healing: Secondary | ICD-10-CM | POA: Diagnosis not present

## 2020-08-29 DIAGNOSIS — M6281 Muscle weakness (generalized): Secondary | ICD-10-CM | POA: Diagnosis not present

## 2020-08-29 DIAGNOSIS — Z9181 History of falling: Secondary | ICD-10-CM | POA: Diagnosis not present

## 2020-08-29 DIAGNOSIS — S0282XS Fracture of other specified skull and facial bones, left side, sequela: Secondary | ICD-10-CM | POA: Diagnosis not present

## 2020-08-30 DIAGNOSIS — R41841 Cognitive communication deficit: Secondary | ICD-10-CM | POA: Diagnosis not present

## 2020-08-30 DIAGNOSIS — S62307D Unspecified fracture of fifth metacarpal bone, left hand, subsequent encounter for fracture with routine healing: Secondary | ICD-10-CM | POA: Diagnosis not present

## 2020-08-30 DIAGNOSIS — S0282XS Fracture of other specified skull and facial bones, left side, sequela: Secondary | ICD-10-CM | POA: Diagnosis not present

## 2020-08-30 DIAGNOSIS — M6281 Muscle weakness (generalized): Secondary | ICD-10-CM | POA: Diagnosis not present

## 2020-08-30 DIAGNOSIS — Z9181 History of falling: Secondary | ICD-10-CM | POA: Diagnosis not present

## 2020-08-30 DIAGNOSIS — S0282XD Fracture of other specified skull and facial bones, left side, subsequent encounter for fracture with routine healing: Secondary | ICD-10-CM | POA: Diagnosis not present

## 2020-09-01 DIAGNOSIS — Z9181 History of falling: Secondary | ICD-10-CM | POA: Diagnosis not present

## 2020-09-01 DIAGNOSIS — M6281 Muscle weakness (generalized): Secondary | ICD-10-CM | POA: Diagnosis not present

## 2020-09-01 DIAGNOSIS — S62307D Unspecified fracture of fifth metacarpal bone, left hand, subsequent encounter for fracture with routine healing: Secondary | ICD-10-CM | POA: Diagnosis not present

## 2020-09-01 DIAGNOSIS — R41841 Cognitive communication deficit: Secondary | ICD-10-CM | POA: Diagnosis not present

## 2020-09-01 DIAGNOSIS — S0282XS Fracture of other specified skull and facial bones, left side, sequela: Secondary | ICD-10-CM | POA: Diagnosis not present

## 2020-09-01 DIAGNOSIS — S0282XD Fracture of other specified skull and facial bones, left side, subsequent encounter for fracture with routine healing: Secondary | ICD-10-CM | POA: Diagnosis not present

## 2020-09-03 DIAGNOSIS — S0282XS Fracture of other specified skull and facial bones, left side, sequela: Secondary | ICD-10-CM | POA: Diagnosis not present

## 2020-09-03 DIAGNOSIS — R41841 Cognitive communication deficit: Secondary | ICD-10-CM | POA: Diagnosis not present

## 2020-09-03 DIAGNOSIS — M6281 Muscle weakness (generalized): Secondary | ICD-10-CM | POA: Diagnosis not present

## 2020-09-03 DIAGNOSIS — S62307D Unspecified fracture of fifth metacarpal bone, left hand, subsequent encounter for fracture with routine healing: Secondary | ICD-10-CM | POA: Diagnosis not present

## 2020-09-03 DIAGNOSIS — Z9181 History of falling: Secondary | ICD-10-CM | POA: Diagnosis not present

## 2020-09-03 DIAGNOSIS — S0282XD Fracture of other specified skull and facial bones, left side, subsequent encounter for fracture with routine healing: Secondary | ICD-10-CM | POA: Diagnosis not present

## 2020-09-04 DIAGNOSIS — M6281 Muscle weakness (generalized): Secondary | ICD-10-CM | POA: Diagnosis not present

## 2020-09-04 DIAGNOSIS — S62307D Unspecified fracture of fifth metacarpal bone, left hand, subsequent encounter for fracture with routine healing: Secondary | ICD-10-CM | POA: Diagnosis not present

## 2020-09-04 DIAGNOSIS — S0282XD Fracture of other specified skull and facial bones, left side, subsequent encounter for fracture with routine healing: Secondary | ICD-10-CM | POA: Diagnosis not present

## 2020-09-04 DIAGNOSIS — R41841 Cognitive communication deficit: Secondary | ICD-10-CM | POA: Diagnosis not present

## 2020-09-04 DIAGNOSIS — S0282XS Fracture of other specified skull and facial bones, left side, sequela: Secondary | ICD-10-CM | POA: Diagnosis not present

## 2020-09-04 DIAGNOSIS — Z9181 History of falling: Secondary | ICD-10-CM | POA: Diagnosis not present

## 2020-09-04 DIAGNOSIS — I495 Sick sinus syndrome: Secondary | ICD-10-CM | POA: Diagnosis not present

## 2020-09-05 DIAGNOSIS — S62307D Unspecified fracture of fifth metacarpal bone, left hand, subsequent encounter for fracture with routine healing: Secondary | ICD-10-CM | POA: Diagnosis not present

## 2020-09-05 DIAGNOSIS — Z9181 History of falling: Secondary | ICD-10-CM | POA: Diagnosis not present

## 2020-09-05 DIAGNOSIS — R41841 Cognitive communication deficit: Secondary | ICD-10-CM | POA: Diagnosis not present

## 2020-09-05 DIAGNOSIS — S0282XS Fracture of other specified skull and facial bones, left side, sequela: Secondary | ICD-10-CM | POA: Diagnosis not present

## 2020-09-05 DIAGNOSIS — S0282XD Fracture of other specified skull and facial bones, left side, subsequent encounter for fracture with routine healing: Secondary | ICD-10-CM | POA: Diagnosis not present

## 2020-09-05 DIAGNOSIS — M6281 Muscle weakness (generalized): Secondary | ICD-10-CM | POA: Diagnosis not present

## 2020-09-10 DIAGNOSIS — M6281 Muscle weakness (generalized): Secondary | ICD-10-CM | POA: Diagnosis not present

## 2020-09-10 DIAGNOSIS — S62307D Unspecified fracture of fifth metacarpal bone, left hand, subsequent encounter for fracture with routine healing: Secondary | ICD-10-CM | POA: Diagnosis not present

## 2020-09-10 DIAGNOSIS — S0282XS Fracture of other specified skull and facial bones, left side, sequela: Secondary | ICD-10-CM | POA: Diagnosis not present

## 2020-09-10 DIAGNOSIS — S0282XD Fracture of other specified skull and facial bones, left side, subsequent encounter for fracture with routine healing: Secondary | ICD-10-CM | POA: Diagnosis not present

## 2020-09-10 DIAGNOSIS — Z9181 History of falling: Secondary | ICD-10-CM | POA: Diagnosis not present

## 2020-09-10 DIAGNOSIS — R41841 Cognitive communication deficit: Secondary | ICD-10-CM | POA: Diagnosis not present

## 2020-09-11 DIAGNOSIS — S62357D Nondisplaced fracture of shaft of fifth metacarpal bone, left hand, subsequent encounter for fracture with routine healing: Secondary | ICD-10-CM | POA: Diagnosis not present

## 2020-09-12 DIAGNOSIS — R41841 Cognitive communication deficit: Secondary | ICD-10-CM | POA: Diagnosis not present

## 2020-09-12 DIAGNOSIS — S0282XD Fracture of other specified skull and facial bones, left side, subsequent encounter for fracture with routine healing: Secondary | ICD-10-CM | POA: Diagnosis not present

## 2020-09-12 DIAGNOSIS — Z9181 History of falling: Secondary | ICD-10-CM | POA: Diagnosis not present

## 2020-09-12 DIAGNOSIS — S62307D Unspecified fracture of fifth metacarpal bone, left hand, subsequent encounter for fracture with routine healing: Secondary | ICD-10-CM | POA: Diagnosis not present

## 2020-09-12 DIAGNOSIS — M6281 Muscle weakness (generalized): Secondary | ICD-10-CM | POA: Diagnosis not present

## 2020-09-12 DIAGNOSIS — S0282XS Fracture of other specified skull and facial bones, left side, sequela: Secondary | ICD-10-CM | POA: Diagnosis not present

## 2020-09-14 ENCOUNTER — Ambulatory Visit: Payer: Medicare Other | Admitting: *Deleted

## 2020-09-14 DIAGNOSIS — I10 Essential (primary) hypertension: Secondary | ICD-10-CM

## 2020-09-14 DIAGNOSIS — I619 Nontraumatic intracerebral hemorrhage, unspecified: Secondary | ICD-10-CM

## 2020-09-14 DIAGNOSIS — S62398D Other fracture of other metacarpal bone, subsequent encounter for fracture with routine healing: Secondary | ICD-10-CM

## 2020-09-14 DIAGNOSIS — S0285XA Fracture of orbit, unspecified, initial encounter for closed fracture: Secondary | ICD-10-CM

## 2020-09-14 DIAGNOSIS — S06300D Unspecified focal traumatic brain injury without loss of consciousness, subsequent encounter: Secondary | ICD-10-CM

## 2020-09-14 NOTE — Chronic Care Management (AMB) (Signed)
  Chronic Care Management   Outreach Note  09/14/2020 Name: Kara [name] MRN: [id number] DOB: 09-Oct-1922  Referred by: Pcp, No Reason for referral : Chronic Care Management (HTN, Fall)   Successful contact was made with patient to verify discharge from short term skilled nursing rehab.  HIPAA verified with patient.  Patient reports she is now back home in her apartment/independent living.  States she is still receiving home health therapy.  Currently has assistance with/from home aide that comes once a week and would like to work with her right now; requesting call back another day and time.  Follow Up Plan: The care management team will reach out to the patient again over the next 14 business days per patient request.   Hubert Azure RN, MSN RN Care Management Coordinator [place] [phone] [email or web address]

## 2020-09-17 DIAGNOSIS — S0282XD Fracture of other specified skull and facial bones, left side, subsequent encounter for fracture with routine healing: Secondary | ICD-10-CM | POA: Diagnosis not present

## 2020-09-17 DIAGNOSIS — M25542 Pain in joints of left hand: Secondary | ICD-10-CM | POA: Diagnosis not present

## 2020-09-17 DIAGNOSIS — R41841 Cognitive communication deficit: Secondary | ICD-10-CM | POA: Diagnosis not present

## 2020-09-17 DIAGNOSIS — R262 Difficulty in walking, not elsewhere classified: Secondary | ICD-10-CM | POA: Diagnosis not present

## 2020-09-17 DIAGNOSIS — S62307D Unspecified fracture of fifth metacarpal bone, left hand, subsequent encounter for fracture with routine healing: Secondary | ICD-10-CM | POA: Diagnosis not present

## 2020-09-17 DIAGNOSIS — S0282XS Fracture of other specified skull and facial bones, left side, sequela: Secondary | ICD-10-CM | POA: Diagnosis not present

## 2020-09-17 DIAGNOSIS — R2689 Other abnormalities of gait and mobility: Secondary | ICD-10-CM | POA: Diagnosis not present

## 2020-09-17 DIAGNOSIS — Z9181 History of falling: Secondary | ICD-10-CM | POA: Diagnosis not present

## 2020-09-17 DIAGNOSIS — M6281 Muscle weakness (generalized): Secondary | ICD-10-CM | POA: Diagnosis not present

## 2020-09-18 DIAGNOSIS — R41841 Cognitive communication deficit: Secondary | ICD-10-CM | POA: Diagnosis not present

## 2020-09-18 DIAGNOSIS — S62307D Unspecified fracture of fifth metacarpal bone, left hand, subsequent encounter for fracture with routine healing: Secondary | ICD-10-CM | POA: Diagnosis not present

## 2020-09-18 DIAGNOSIS — S0282XS Fracture of other specified skull and facial bones, left side, sequela: Secondary | ICD-10-CM | POA: Diagnosis not present

## 2020-09-18 DIAGNOSIS — S0282XD Fracture of other specified skull and facial bones, left side, subsequent encounter for fracture with routine healing: Secondary | ICD-10-CM | POA: Diagnosis not present

## 2020-09-18 DIAGNOSIS — M6281 Muscle weakness (generalized): Secondary | ICD-10-CM | POA: Diagnosis not present

## 2020-09-18 DIAGNOSIS — Z9181 History of falling: Secondary | ICD-10-CM | POA: Diagnosis not present

## 2020-09-19 DIAGNOSIS — S0282XS Fracture of other specified skull and facial bones, left side, sequela: Secondary | ICD-10-CM | POA: Diagnosis not present

## 2020-09-19 DIAGNOSIS — M6281 Muscle weakness (generalized): Secondary | ICD-10-CM | POA: Diagnosis not present

## 2020-09-19 DIAGNOSIS — R41841 Cognitive communication deficit: Secondary | ICD-10-CM | POA: Diagnosis not present

## 2020-09-19 DIAGNOSIS — S62307D Unspecified fracture of fifth metacarpal bone, left hand, subsequent encounter for fracture with routine healing: Secondary | ICD-10-CM | POA: Diagnosis not present

## 2020-09-19 DIAGNOSIS — Z9181 History of falling: Secondary | ICD-10-CM | POA: Diagnosis not present

## 2020-09-19 DIAGNOSIS — S0282XD Fracture of other specified skull and facial bones, left side, subsequent encounter for fracture with routine healing: Secondary | ICD-10-CM | POA: Diagnosis not present

## 2020-09-24 ENCOUNTER — Ambulatory Visit (INDEPENDENT_AMBULATORY_CARE_PROVIDER_SITE_OTHER): Payer: Medicare Other | Admitting: *Deleted

## 2020-09-24 DIAGNOSIS — I619 Nontraumatic intracerebral hemorrhage, unspecified: Secondary | ICD-10-CM

## 2020-09-24 DIAGNOSIS — S06300D Unspecified focal traumatic brain injury without loss of consciousness, subsequent encounter: Secondary | ICD-10-CM

## 2020-09-24 DIAGNOSIS — Z9181 History of falling: Secondary | ICD-10-CM | POA: Diagnosis not present

## 2020-09-24 DIAGNOSIS — I10 Essential (primary) hypertension: Secondary | ICD-10-CM

## 2020-09-24 DIAGNOSIS — R41841 Cognitive communication deficit: Secondary | ICD-10-CM | POA: Diagnosis not present

## 2020-09-24 DIAGNOSIS — S0282XD Fracture of other specified skull and facial bones, left side, subsequent encounter for fracture with routine healing: Secondary | ICD-10-CM | POA: Diagnosis not present

## 2020-09-24 DIAGNOSIS — S62398D Other fracture of other metacarpal bone, subsequent encounter for fracture with routine healing: Secondary | ICD-10-CM

## 2020-09-24 DIAGNOSIS — S0285XA Fracture of orbit, unspecified, initial encounter for closed fracture: Secondary | ICD-10-CM

## 2020-09-24 DIAGNOSIS — E1140 Type 2 diabetes mellitus with diabetic neuropathy, unspecified: Secondary | ICD-10-CM | POA: Diagnosis not present

## 2020-09-24 DIAGNOSIS — M6281 Muscle weakness (generalized): Secondary | ICD-10-CM | POA: Diagnosis not present

## 2020-09-24 DIAGNOSIS — S62307D Unspecified fracture of fifth metacarpal bone, left hand, subsequent encounter for fracture with routine healing: Secondary | ICD-10-CM | POA: Diagnosis not present

## 2020-09-24 DIAGNOSIS — S0282XS Fracture of other specified skull and facial bones, left side, sequela: Secondary | ICD-10-CM | POA: Diagnosis not present

## 2020-09-25 NOTE — Chronic Care Management (AMB) (Signed)
Chronic Care Management   CCM RN Visit Note  09/25/2020 Name: Kara [name] MRN: [id number] DOB: Sep 16, 1922  Subjective: Kara [name] is a 87 y.o. year old female who is a primary care patient of Pcp, No. The care management team was consulted for assistance with disease management and care coordination needs.    Engaged with patient by telephone for follow up visit in response to provider referral for case management and/or care coordination services.   Consent to Services:  The patient was given information about Chronic Care Management services, agreed to services, and gave verbal consent prior to initiation of services.  Please see initial visit note for detailed documentation.   Patient agreed to services and verbal consent obtained.   Assessment: Review of patient past medical history, allergies, medications, health status, including review of consultants reports, laboratory and other test data, was performed as part of comprehensive evaluation and provision of chronic care management services.   SDOH (Social Determinants of Health) assessments and interventions performed:    CCM Care Plan  Allergies  Allergen Reactions  . Baycol [Cerivastatin Sodium]   . Cardizem [Diltiazem Hcl]     LOWER EXTREMITY EDEMA  . Crestor [Rosuvastatin Calcium] Other (See Comments)    INTOLERANT  . Dronedarone Other (See Comments)    Fatigue  . Metoprolol Tartrate Other (See Comments)  . Omeprazole Other (See Comments)  . Pravachol     INTOLERANT  . Statins Other (See Comments)  . Vioxx [Rofecoxib]   . Zocor [Simvastatin]     INTOLERANT    Outpatient Encounter Medications as of 09/24/2020  Medication Sig Note  . acetaminophen (TYLENOL) 500 MG tablet Take 500 mg by mouth every 6 (six) hours as needed.   . cholecalciferol (VITAMIN D) 1000 UNITS tablet Take 1,000 Units by mouth daily. 07/23/2020: Reports not taking in a while  . cyanocobalamin (,VITAMIN B-12,) 1000 MCG/ML injection INJECT 1ML  INTO THE MUSCLE EVERY 14 DAYS   . diphenoxylate-atropine (LOMOTIL) 2.5-0.025 MG tablet Take 1 tablet by mouth 4 (four) times daily as needed for diarrhea or loose stools.   Marland Kitchen estradiol (ESTRACE) 0.1 MG/GM vaginal cream Place vaginally. 09/24/2020: As needed  . meloxicam (MOBIC) 7.5 MG tablet Take 1 tablet (7.5 mg total) by mouth daily. 09/14/2020: Reports taking 15 mg daily  . metoprolol succinate (TOPROL-XL) 25 MG 24 hr tablet TAKE 1 TABLET BY MOUTH IN THE MORNING AND AT BEDTIME   . acetaminophen-codeine (TYLENOL #3) 300-30 MG tablet Take 1-2 tablets by mouth every 6 (six) hours as needed for moderate pain. REPLACES TRAMADOL RX SENT IN TODAY (Patient not taking: Reported on 09/24/2020)   . clobetasol cream (TEMOVATE) 0.05 % Apply topically.   . Infant Care Products (DERMACLOUD) OINT Apply 1 application topically as needed.   . metoprolol succinate (TOPROL-XL) 25 MG 24 hr tablet Take 25 mg by mouth in the morning and at bedtime. 10/24/6718: duplicate  . triamcinolone (KENALOG) 0.1 % APPLY 1 APPLICATION TOPICALLY 2 TIMES DAILY TO VULVA UNTIL ITCHING RESOLVES THEN REDUCE USE TO TWICE WEEKLY.    No facility-administered encounter medications on file as of 09/24/2020.    Patient Active Problem List   Diagnosis Date Noted  . Left buttock pain 07/30/2020  . Left hand pain 07/30/2020  . History of recent fall 07/30/2020  . Hospital discharge follow-up 07/22/2020  . Intraparenchymal hemorrhage of brain ([place]) 07/13/2020  . Syncope and collapse 07/13/2020  . Facial fracture due to fall ([place]) 07/13/2020  . Orbital fracture (  [place]) 07/13/2020  . Hypokalemia 07/13/2020  . Atrial fibrillation, chronic ([place]) 07/13/2020  . HTN (hypertension) 07/13/2020  . Type 2 diabetes mellitus ([place]) 07/13/2020  . DNR (do not resuscitate) 07/13/2020  . S/P placement of cardiac pacemaker 07/03/2020  . Myalgia due to statin 07/03/2020  . Aortic atherosclerosis ([place]) 07/03/2020  . Displaced intertrochanteric fracture of left  femur, sequela 06/25/2019  . Age-related osteoporosis with current pathological fracture with routine healing 06/15/2019  . Sciatica of right side 02/18/2019  . Vaginitis and vulvovaginitis 12/26/2018  . Educated about COVID-19 virus infection 10/12/2018  . Edema of lower extremity 11/24/2017  . Exertional dyspnea 06/16/2017  . Venous insufficiency of both lower extremities 11/24/2016  . Essential hypertension 11/11/2016  . Peripheral artery disease ([place]) 11/11/2016  . Chronic pulmonary hypertension ([place]) 10/20/2016  . Leg swelling 06/17/2016  . Type 2 diabetes mellitus with diabetic neuropathy, unspecified ([place]) 06/17/2016  . Urinary incontinence, urge 12/27/2015  . Atrophic vaginitis 09/26/2015  . Severe tricuspid valve insufficiency 07/04/2015  . Umbilical hernia without obstruction and without gangrene 06/26/2015  . Carpal tunnel syndrome of right wrist 05/29/2015  . Chronic fatigue 05/01/2015  . Vaginal atrophy 11/21/2014  . Urethral caruncle 11/21/2014  . Mitral valve regurgitation 05/09/2014  . Fatigue 03/09/2014  . SSS (sick sinus syndrome) ([place]) 10/11/2013  . Encounter for current long-term use of anticoagulants 10/06/2012  . Osteoarthritis 03/08/2012  . Screening for breast cancer 03/08/2012  . Pernicious anemia 02/04/2012  . History of colectomy 02/04/2012  . Vitamin D deficiency 07/30/2011  . Acquired thrombophilia ([place]) 06/13/2011  . Coronary atherosclerosis due to calcified coronary lesion 06/13/2011  . Chronic diarrhea 06/13/2011    Conditions to be addressed/monitored:HTN, DMII and Falls  Care Plan : Hypertension (Adult)  Updates made by Kara Singleton, RN since 09/25/2020 12:00 AM  Problem: Hypertension/Diabetes   Priority: Medium  Long-Range Goal: Hypertension Monitored/ Report maintaining Hgb A1C of below 7   Start Date: 07/23/2020  Expected End Date: 03/18/2021  This Visit's Progress: Not on track  Priority: Medium  Objective:  Lab Results   Component Value Date   HGBA1C 6.3 (H) 07/14/2020  Current Barriers:  Marland Kitchen Knowledge Deficits related to basic understanding of hypertension pathophysiology and self care management as evidenced by patient stating she does not monitor blood pressures at home.  Discussed monitoring blood pressures at least monthly and asking the nurses at the [facility] to help.  Does not monitor blood sugars.  States she does try to limit her carbohydrate intake   . Unable to perform ADLs independently . Unable to perform IADLs independently Nurse Case Manager Clinical Goal(s):  Marland Kitchen Over the next 120 days, patient will verbalize understanding of plan for hypertension management and report maintaining Hgb A1C of below 7 . Over the next 120 days, patient will demonstrate improved adherence to prescribed treatment plan for hypertension as evidenced by taking all medications as prescribed, monitoring and recording blood pressure as directed (monthly), adhering to low sodium/DASH diabetic carbohydrate modified diet Interventions:   . Collaboration with Kara [name] regarding development and update of comprehensive plan of care as evidenced by provider attestation and co-signature . Inter-disciplinary care team collaboration (see longitudinal plan of care) . Evaluation of current treatment plan related to hypertension self management and patient's adherence to plan as established by provider. . Provided education to patient re: stroke prevention, s/s of heart attack and stroke, DASH diet, complications of uncontrolled blood pressure . Reviewed medications with patient and discussed importance  of compliance . Discussed plans with patient for ongoing care management follow up and provided patient with direct contact information for care management team . Advised patient, providing education and rationale, to monitor blood pressure monthly and record, calling PCP for findings outside established parameters.   . Reviewed scheduled/upcoming provider appointments including: PCP 01/03/21 . Encouraged patient to request blood pressure checks by therapist prior to therapy sessions and to write readings down for provider review until she is able to go down to nursing station to have nurses check blood pressures . Encouraged patient to use 2022 Calendar Booklet previously sent to help document blood pressures and keep track of appointments . Reviewed current Hgb A1C and discussed ways to keep within goal . Reviewed and encouraged carbohydrate modified low salt diabetic diet . Encouraged to increase activity as tolerated Patient Goals/Self-Care Activities:  Over the next 30 days, patient will: . Consider checking blood pressures at least once a month . Low salt carbohydrate modified diabetic diet . Limit intake of sweets and sugars Follow Up Plan: The care management team will reach out to the patient again over the next 45 business days.     Care Plan : Fall Risk (Adult)  Updates made by Kara Singleton, RN since 09/25/2020 12:00 AM  Problem: Increase fall risk related to resent fall with orbital and facial  fractures   Priority: High  Long-Range Goal: Patient will report no more falls within the next 90 days   Start Date: 07/23/2020  Expected End Date: 01/16/2021  This Visit's Progress: On track  Priority: High  Current Barriers:  Marland Kitchen Knowledge Deficits related to fall precautions in patient with recent fall resulting in facial and orbital fractures and intracranial hemorrhage with right finger fracture.  Does report short stay in rehab and now back home.  States she is able to complete her own ADLs of bathing and dressing.  Participates in home therapy twice a week and has home aide assistance once a week on Fridays.  Other days she is alone and is able to manage.  Is using walker for ambulation and does go out [place] area to socialize a few times a week.  Patient does acknowledge she is slower to move  around but feels she is getting better. . Decreased adherence to prescribed treatment for fall prevention . Unable to perform ADLs independently at this time, needing assistance . Unable to perform IADLs independently at this time needing assistance Clinical Goal(s):  . patient will demonstrate improved adherence to prescribed treatment plan for decreasing falls as evidenced by patient reporting no falls in the next 45 days and review of EMR . patient will verbalize using fall risk reduction strategies discussed . patient will not experience additional falls . patient will work with home health therapy to address needs related to fall precautions and increasing strength Interventions:  . Collaboration with Kara [name] regarding development and update of comprehensive plan of care as evidenced by provider attestation and co-signature . Inter-disciplinary care team collaboration (see longitudinal plan of care) . Provided written and verbal education re: Potential causes of falls and Fall prevention strategies . Reviewed medications and discussed potential side effects of medications such as dizziness and frequent urination . Assessed patients knowledge of fall risk prevention  . Reviewed scheduled/upcoming provider appointments including:  PCP 01/03/21 . Assistive or adaptive device use encouraged, discussed and encouraged use of walker with all ambulation . Cognition assessed . Fall prevention plan reviewed and updated . Fear of falling,  loss of independence and pain acknowledged . Participation in rehabilitation therapy encouraged, encouraged to increase activity slowly as tolerated . Encouraged to do range of motion exercises while sitting . Discussed importance of social interaction and encouraged patient go out of apartment to common areas at least 3 times a week . Emotional support and empathy provided to patient Patient Goals/Self-Care Deficits:   . Call the doctor if I am feeling too  drowsy with arthritis medication . Keep my cell phone with me always . Make an emergency alert plan in case I fall . Use a Rolator with all ambulation . Continue to work with home health therapy . Take time and move slowly; try not to rush Follow Up Plan: RNCM will outreach to patient within the next 45 business days     Plan:The care management team will reach out to the patient again over the next 45 business days.  Kara Azure RN, MSN RN Care Management Coordinator [place] [phone] [name]

## 2020-09-25 NOTE — Patient Instructions (Addendum)
Visit Information  PATIENT GOALS: Goals Addressed            This Visit's Progress   . (RNCM) Manage My Blood Pressure & Diabetes   Not on track    Timeframe:  Long-Range Goal Priority:  Medium Start Date:  07/23/20                           Expected End Date:   01/15/21                    Follow Up Date 10/22/20    . Consider checking blood pressures at least once a month . Low salt carbohydrate modified diabetic diet . Limit intake of sweets and sugars   Why is this important?    You won't feel high blood pressure, but it can still hurt your blood vessels.   High blood pressure can cause heart or kidney problems. It can also cause a stroke.   Making lifestyle changes like losing a little weight or eating less salt will help.   Checking your blood pressure at home and at different times of the day can help to control blood pressure.   If the doctor prescribes medicine remember to take it the way the doctor ordered.   Call the office if you cannot afford the medicine or if there are questions about it.     Notes:       Marland Kitchen (RNCM) Prevent further Falls and Injury   On track    Timeframe:  Long-Range Goal Priority:  High Start Date:   07/23/20                          Expected End Date:  01/15/21                     Follow Up Date 10/22/20    . Call the doctor if I am feeling too drowsy with arthritis medication . Keep my cell phone with me always . Make an emergency alert plan in case I fall . Use a Rolator with all ambulation . Continue to work with home health therapy . Take time and move slowly; try not to rush    Why is this important?    Most falls happen when it is hard for you to walk safely. Your balance may be off because of an illness. You may have pain in your knees, hip or other joints.   You may be overly tired or taking medicines that make you sleepy. You may not be able to see or hear clearly.   Falls can lead to broken bones, bruises or other injuries.    There are things you can do to help prevent falling.     Notes:         Patient verbalizes understanding of instructions provided today and agrees to view in [place].   The care management team will reach out to the patient again over the next 45 business days.   Hubert Azure RN, MSN RN Care Management Coordinator [place] [phone] [email or web address]

## 2020-09-26 DIAGNOSIS — S0282XD Fracture of other specified skull and facial bones, left side, subsequent encounter for fracture with routine healing: Secondary | ICD-10-CM | POA: Diagnosis not present

## 2020-09-26 DIAGNOSIS — M6281 Muscle weakness (generalized): Secondary | ICD-10-CM | POA: Diagnosis not present

## 2020-09-26 DIAGNOSIS — S0282XS Fracture of other specified skull and facial bones, left side, sequela: Secondary | ICD-10-CM | POA: Diagnosis not present

## 2020-09-26 DIAGNOSIS — R41841 Cognitive communication deficit: Secondary | ICD-10-CM | POA: Diagnosis not present

## 2020-09-26 DIAGNOSIS — S62307D Unspecified fracture of fifth metacarpal bone, left hand, subsequent encounter for fracture with routine healing: Secondary | ICD-10-CM | POA: Diagnosis not present

## 2020-09-26 DIAGNOSIS — Z9181 History of falling: Secondary | ICD-10-CM | POA: Diagnosis not present

## 2020-09-29 DIAGNOSIS — S62307D Unspecified fracture of fifth metacarpal bone, left hand, subsequent encounter for fracture with routine healing: Secondary | ICD-10-CM | POA: Diagnosis not present

## 2020-09-29 DIAGNOSIS — Z9181 History of falling: Secondary | ICD-10-CM | POA: Diagnosis not present

## 2020-09-29 DIAGNOSIS — S0282XD Fracture of other specified skull and facial bones, left side, subsequent encounter for fracture with routine healing: Secondary | ICD-10-CM | POA: Diagnosis not present

## 2020-09-29 DIAGNOSIS — M6281 Muscle weakness (generalized): Secondary | ICD-10-CM | POA: Diagnosis not present

## 2020-09-29 DIAGNOSIS — S0282XS Fracture of other specified skull and facial bones, left side, sequela: Secondary | ICD-10-CM | POA: Diagnosis not present

## 2020-09-29 DIAGNOSIS — R41841 Cognitive communication deficit: Secondary | ICD-10-CM | POA: Diagnosis not present

## 2020-10-01 ENCOUNTER — Other Ambulatory Visit (INDEPENDENT_AMBULATORY_CARE_PROVIDER_SITE_OTHER): Payer: Medicare Other

## 2020-10-01 ENCOUNTER — Telehealth: Payer: Self-pay

## 2020-10-01 ENCOUNTER — Other Ambulatory Visit: Payer: Self-pay

## 2020-10-01 DIAGNOSIS — S62307D Unspecified fracture of fifth metacarpal bone, left hand, subsequent encounter for fracture with routine healing: Secondary | ICD-10-CM | POA: Diagnosis not present

## 2020-10-01 DIAGNOSIS — R300 Dysuria: Secondary | ICD-10-CM

## 2020-10-01 DIAGNOSIS — S0282XS Fracture of other specified skull and facial bones, left side, sequela: Secondary | ICD-10-CM | POA: Diagnosis not present

## 2020-10-01 DIAGNOSIS — S0282XD Fracture of other specified skull and facial bones, left side, subsequent encounter for fracture with routine healing: Secondary | ICD-10-CM | POA: Diagnosis not present

## 2020-10-01 DIAGNOSIS — Z9181 History of falling: Secondary | ICD-10-CM | POA: Diagnosis not present

## 2020-10-01 DIAGNOSIS — R41841 Cognitive communication deficit: Secondary | ICD-10-CM | POA: Diagnosis not present

## 2020-10-01 DIAGNOSIS — M6281 Muscle weakness (generalized): Secondary | ICD-10-CM | POA: Diagnosis not present

## 2020-10-01 NOTE — Telephone Encounter (Signed)
Patient having burning with urination and frequency since Friday , stated she has had UTI before and feels like she has in the past the sting ing and burning and feeling her bladder is still full eventhough she has went several times. Tried to schedule with NP but patient insist to bring urine in I did schedule a phone visit with PCP for 10/03/20 put orders in for Ua and culture.

## 2020-10-01 NOTE — Telephone Encounter (Signed)
Pt called and states that she thinks she has an UTI and symptoms have been going on for a week. It has gotten worse over the weekend. It is burning and feels like she needs to urinate but not much is coming out. She states that she can bring a specimen but does not want an appt. Please advise.

## 2020-10-02 ENCOUNTER — Other Ambulatory Visit: Payer: Self-pay | Admitting: Internal Medicine

## 2020-10-02 LAB — URINALYSIS, ROUTINE W REFLEX MICROSCOPIC
Bilirubin Urine: NEGATIVE
Ketones, ur: NEGATIVE
Nitrite: POSITIVE — AB
Specific Gravity, Urine: 1.02 (ref 1.000–1.030)
Total Protein, Urine: 30 — AB
Urine Glucose: NEGATIVE
Urobilinogen, UA: 0.2 (ref 0.0–1.0)
pH: 6 (ref 5.0–8.0)

## 2020-10-02 MED ORDER — SULFAMETHOXAZOLE-TRIMETHOPRIM 800-160 MG PO TABS: 1.0000 | ORAL_TABLET | Freq: Two times a day (BID) | ORAL | 0 refills | Status: DC

## 2020-10-03 ENCOUNTER — Other Ambulatory Visit: Payer: BLUE CROSS/BLUE SHIELD

## 2020-10-03 DIAGNOSIS — L218 Other seborrheic dermatitis: Secondary | ICD-10-CM | POA: Diagnosis not present

## 2020-10-03 LAB — URINE CULTURE
MICRO NUMBER:: 11894451
SPECIMEN QUALITY:: ADEQUATE

## 2020-10-04 ENCOUNTER — Other Ambulatory Visit: Payer: Self-pay | Admitting: Internal Medicine

## 2020-10-04 DIAGNOSIS — N390 Urinary tract infection, site not specified: Secondary | ICD-10-CM

## 2020-10-04 DIAGNOSIS — B9620 Unspecified Escherichia coli [E. coli] as the cause of diseases classified elsewhere: Secondary | ICD-10-CM

## 2020-10-04 MED ORDER — FOSFOMYCIN TROMETHAMINE 3 G PO PACK: 3.0000 g | PACK | Freq: Once | ORAL | 0 refills | Status: AC

## 2020-10-04 NOTE — Assessment & Plan Note (Signed)
Resistant to cipro and septra .  rx fosomycin. One dose

## 2020-10-05 ENCOUNTER — Telehealth: Payer: Self-pay | Admitting: Internal Medicine

## 2020-10-05 NOTE — Telephone Encounter (Signed)
Patient was confused that she had to take 2 Probiotics patient was advised she would take one tablet an antibiotic and a probiotic.

## 2020-10-05 NOTE — Telephone Encounter (Signed)
PT is calling back to get clarification on the meds that she was told to take yesterday. She is confuse on what she is suppose to take as the Rx told her something different when she went to them. She is expecting a delivery of the meds soon and would like some clarification before they arrive.

## 2020-10-08 DIAGNOSIS — Z9181 History of falling: Secondary | ICD-10-CM | POA: Diagnosis not present

## 2020-10-08 DIAGNOSIS — M6281 Muscle weakness (generalized): Secondary | ICD-10-CM | POA: Diagnosis not present

## 2020-10-08 DIAGNOSIS — R41841 Cognitive communication deficit: Secondary | ICD-10-CM | POA: Diagnosis not present

## 2020-10-08 DIAGNOSIS — S0282XS Fracture of other specified skull and facial bones, left side, sequela: Secondary | ICD-10-CM | POA: Diagnosis not present

## 2020-10-08 DIAGNOSIS — S0282XD Fracture of other specified skull and facial bones, left side, subsequent encounter for fracture with routine healing: Secondary | ICD-10-CM | POA: Diagnosis not present

## 2020-10-08 DIAGNOSIS — S62307D Unspecified fracture of fifth metacarpal bone, left hand, subsequent encounter for fracture with routine healing: Secondary | ICD-10-CM | POA: Diagnosis not present

## 2020-10-09 DIAGNOSIS — Z9181 History of falling: Secondary | ICD-10-CM | POA: Diagnosis not present

## 2020-10-09 DIAGNOSIS — S0282XS Fracture of other specified skull and facial bones, left side, sequela: Secondary | ICD-10-CM | POA: Diagnosis not present

## 2020-10-09 DIAGNOSIS — S62307D Unspecified fracture of fifth metacarpal bone, left hand, subsequent encounter for fracture with routine healing: Secondary | ICD-10-CM | POA: Diagnosis not present

## 2020-10-09 DIAGNOSIS — S0282XD Fracture of other specified skull and facial bones, left side, subsequent encounter for fracture with routine healing: Secondary | ICD-10-CM | POA: Diagnosis not present

## 2020-10-09 DIAGNOSIS — M6281 Muscle weakness (generalized): Secondary | ICD-10-CM | POA: Diagnosis not present

## 2020-10-09 DIAGNOSIS — R41841 Cognitive communication deficit: Secondary | ICD-10-CM | POA: Diagnosis not present

## 2020-10-11 DIAGNOSIS — M6281 Muscle weakness (generalized): Secondary | ICD-10-CM | POA: Diagnosis not present

## 2020-10-11 DIAGNOSIS — S0282XD Fracture of other specified skull and facial bones, left side, subsequent encounter for fracture with routine healing: Secondary | ICD-10-CM | POA: Diagnosis not present

## 2020-10-11 DIAGNOSIS — S62307D Unspecified fracture of fifth metacarpal bone, left hand, subsequent encounter for fracture with routine healing: Secondary | ICD-10-CM | POA: Diagnosis not present

## 2020-10-11 DIAGNOSIS — Z9181 History of falling: Secondary | ICD-10-CM | POA: Diagnosis not present

## 2020-10-11 DIAGNOSIS — R41841 Cognitive communication deficit: Secondary | ICD-10-CM | POA: Diagnosis not present

## 2020-10-11 DIAGNOSIS — S0282XS Fracture of other specified skull and facial bones, left side, sequela: Secondary | ICD-10-CM | POA: Diagnosis not present

## 2020-10-17 DIAGNOSIS — R262 Difficulty in walking, not elsewhere classified: Secondary | ICD-10-CM | POA: Diagnosis not present

## 2020-10-17 DIAGNOSIS — M25542 Pain in joints of left hand: Secondary | ICD-10-CM | POA: Diagnosis not present

## 2020-10-17 DIAGNOSIS — S32020S Wedge compression fracture of second lumbar vertebra, sequela: Secondary | ICD-10-CM | POA: Diagnosis not present

## 2020-10-17 DIAGNOSIS — S62307D Unspecified fracture of fifth metacarpal bone, left hand, subsequent encounter for fracture with routine healing: Secondary | ICD-10-CM | POA: Diagnosis not present

## 2020-10-17 DIAGNOSIS — S0282XD Fracture of other specified skull and facial bones, left side, subsequent encounter for fracture with routine healing: Secondary | ICD-10-CM | POA: Diagnosis not present

## 2020-10-17 DIAGNOSIS — Z9181 History of falling: Secondary | ICD-10-CM | POA: Diagnosis not present

## 2020-10-17 DIAGNOSIS — R2689 Other abnormalities of gait and mobility: Secondary | ICD-10-CM | POA: Diagnosis not present

## 2020-10-17 DIAGNOSIS — R41841 Cognitive communication deficit: Secondary | ICD-10-CM | POA: Diagnosis not present

## 2020-10-17 DIAGNOSIS — M6281 Muscle weakness (generalized): Secondary | ICD-10-CM | POA: Diagnosis not present

## 2020-10-17 DIAGNOSIS — S0282XS Fracture of other specified skull and facial bones, left side, sequela: Secondary | ICD-10-CM | POA: Diagnosis not present

## 2020-10-19 ENCOUNTER — Ambulatory Visit (INDEPENDENT_AMBULATORY_CARE_PROVIDER_SITE_OTHER): Payer: Medicare Other | Admitting: Internal Medicine

## 2020-10-19 ENCOUNTER — Other Ambulatory Visit: Payer: Self-pay

## 2020-10-19 ENCOUNTER — Encounter: Payer: Self-pay | Admitting: Internal Medicine

## 2020-10-19 ENCOUNTER — Ambulatory Visit (INDEPENDENT_AMBULATORY_CARE_PROVIDER_SITE_OTHER): Payer: Medicare Other

## 2020-10-19 VITALS — BP 150/60 | HR 89 | Temp 96.0°F | Ht 62.01 in | Wt 124.6 lb

## 2020-10-19 DIAGNOSIS — I2510 Atherosclerotic heart disease of native coronary artery without angina pectoris: Secondary | ICD-10-CM

## 2020-10-19 DIAGNOSIS — M545 Low back pain, unspecified: Secondary | ICD-10-CM

## 2020-10-19 DIAGNOSIS — M25551 Pain in right hip: Secondary | ICD-10-CM

## 2020-10-19 DIAGNOSIS — B9620 Unspecified Escherichia coli [E. coli] as the cause of diseases classified elsewhere: Secondary | ICD-10-CM | POA: Diagnosis not present

## 2020-10-19 DIAGNOSIS — M5431 Sciatica, right side: Secondary | ICD-10-CM | POA: Diagnosis not present

## 2020-10-19 DIAGNOSIS — N390 Urinary tract infection, site not specified: Secondary | ICD-10-CM | POA: Diagnosis not present

## 2020-10-19 DIAGNOSIS — M1611 Unilateral primary osteoarthritis, right hip: Secondary | ICD-10-CM | POA: Diagnosis not present

## 2020-10-19 DIAGNOSIS — E1140 Type 2 diabetes mellitus with diabetic neuropathy, unspecified: Secondary | ICD-10-CM

## 2020-10-19 DIAGNOSIS — I2584 Coronary atherosclerosis due to calcified coronary lesion: Secondary | ICD-10-CM | POA: Diagnosis not present

## 2020-10-19 DIAGNOSIS — M47816 Spondylosis without myelopathy or radiculopathy, lumbar region: Secondary | ICD-10-CM | POA: Diagnosis not present

## 2020-10-19 DIAGNOSIS — N182 Chronic kidney disease, stage 2 (mild): Secondary | ICD-10-CM

## 2020-10-19 LAB — POCT URINALYSIS DIPSTICK
Bilirubin, UA: NEGATIVE
Glucose, UA: NEGATIVE
Ketones, UA: 1.02
Nitrite, UA: POSITIVE
Protein, UA: POSITIVE — AB
Spec Grav, UA: 1.02 (ref 1.010–1.025)
Urobilinogen, UA: 0.2 E.U./dL
pH, UA: 5.5 (ref 5.0–8.0)

## 2020-10-19 LAB — URINALYSIS, ROUTINE W REFLEX MICROSCOPIC
Bilirubin Urine: NEGATIVE
Ketones, ur: NEGATIVE
Nitrite: POSITIVE — AB
Specific Gravity, Urine: 1.015 (ref 1.000–1.030)
Total Protein, Urine: NEGATIVE
Urine Glucose: NEGATIVE
Urobilinogen, UA: 0.2 (ref 0.0–1.0)
pH: 5 (ref 5.0–8.0)

## 2020-10-19 NOTE — Patient Instructions (Signed)
  I want you to add some protein to your cereal breakfast  Make sure you drink at least one of Premier Protein drinks per day    Reduce the meloxicam  to 7.5 mg daily    You can add up to 2000 mg of acetominophen (tylenol) every day safely  In divided doses (500 mg every 6 hours  Or 1000 mg every 12 hours.)  You will not get addicted to tylenol    x ray today to rule out a hip fracture

## 2020-10-19 NOTE — Progress Notes (Signed)
Subjective:  Patient ID: Kara [name], female    DOB: 07-18-1922  Age: 87 y.o. MRN: [id number]  CC: The primary encounter diagnosis was E. coli UTI. Diagnoses of Acute low back pain without sciatica, unspecified back pain laterality, Acute right hip pain, CKD (chronic kidney disease) stage 2, GFR 60-89 ml/min, Sciatica of right side, and Type 2 diabetes mellitus with diabetic neuropathy, without long-term current use of insulin (HCC) were also pertinent to this visit.  HPI Kara [name] presents for UTI,  Back pain   This visit occurred during the SARS-CoV-2 public health emergency.  Safety protocols were in place, including screening questions prior to the visit, additional usage of staff PPE, and extensive cleaning of exam room while observing appropriate contact time as indicated for disinfecting solutions.   Recent treatment for E Coli UTI:  Presented with burning and frequency.   Culture was Resistant to cipro.  Given fosfomycin x 1 dose . ,  Still having burning at the end of void. .  Denies hematuria   Back pain : for the last 4-5 days not flank,  More at [facility] joint and hip level.   Has been taking 2 tylenol  And  15 mg meloxicam prescribed by Dr Sabra Heck (7.5 mg prescribed by me).  No prior imaging or steroid injections in right hip. She has not fallen since her last visit and all fractures /injuries have healed.   Urine collected today without precleansing  poct positive  From nitrates  Has lost 2 lbs .  Does not weigh at home.  Eating at home 1 good meal daily (doesn't like the VOB food). Eats cereal,  Cottage cheese ,  Yogurt and fruit.     Outpatient Medications Prior to Visit  Medication Sig Dispense Refill  . acetaminophen (TYLENOL) 500 MG tablet Take 500 mg by mouth every 6 (six) hours as needed.    Marland Kitchen acetaminophen-codeine (TYLENOL #3) 300-30 MG tablet Take 1-2 tablets by mouth every 6 (six) hours as needed for moderate pain. REPLACES TRAMADOL RX SENT IN TODAY 60 tablet 0  .  cholecalciferol (VITAMIN D) 1000 UNITS tablet Take 1,000 Units by mouth daily.    . clobetasol cream (TEMOVATE) 0.05 % Apply topically.    . cyanocobalamin (,VITAMIN B-12,) 1000 MCG/ML injection INJECT 1ML INTO THE MUSCLE EVERY 14 DAYS 10 mL 0  . diphenoxylate-atropine (LOMOTIL) 2.5-0.025 MG tablet Take 1 tablet by mouth 4 (four) times daily as needed for diarrhea or loose stools.    Marland Kitchen estradiol (ESTRACE) 0.1 MG/GM vaginal cream Place vaginally.    . Infant Care Products (DERMACLOUD) OINT Apply 1 application topically as needed. 430 g 1  . meloxicam (MOBIC) 7.5 MG tablet Take 1 tablet (7.5 mg total) by mouth daily. 30 tablet 0  . metoprolol succinate (TOPROL-XL) 25 MG 24 hr tablet Take 25 mg by mouth in the morning and at bedtime.    . metoprolol succinate (TOPROL-XL) 25 MG 24 hr tablet TAKE 1 TABLET BY MOUTH IN THE MORNING AND AT BEDTIME 180 tablet 1  . triamcinolone (KENALOG) 0.1 % APPLY 1 APPLICATION TOPICALLY 2 TIMES DAILY TO VULVA UNTIL ITCHING RESOLVES THEN REDUCE USE TO TWICE WEEKLY. 15 g 1  . sulfamethoxazole-trimethoprim (BACTRIM DS) 800-160 MG tablet Take 1 tablet by mouth 2 (two) times daily. (Patient not taking: Reported on 10/19/2020) 10 tablet 0   No facility-administered medications prior to visit.    Review of Systems;  Patient denies headache, fevers, malaise, unintentional weight loss, skin  rash, eye pain, sinus congestion and sinus pain, sore throat, dysphagia,  hemoptysis , cough, dyspnea, wheezing, chest pain, palpitations, orthopnea, edema, abdominal pain, nausea, melena, diarrhea, constipation, flank pain, dysuria, hematuria, urinary  Frequency, nocturia, numbness, tingling, seizures,  Focal weakness, Loss of consciousness,  Tremor, insomnia, depression, anxiety, and suicidal ideation.      Objective:  BP (!) 150/60 (BP Location: Left Arm, Patient Position: Sitting)   Pulse 89   Temp (!) 96 F (35.6 C)   Ht 5' 2.01" (1.575 m)   Wt 124 lb 9.6 oz (56.5 kg)   SpO2 97%    BMI 22.78 kg/m   BP Readings from Last 3 Encounters:  10/19/20 (!) 150/60  07/30/20 (!) 150/82  07/20/20 128/78    Wt Readings from Last 3 Encounters:  10/19/20 124 lb 9.6 oz (56.5 kg)  07/30/20 127 lb (57.6 kg)  07/20/20 127 lb (57.6 kg)    General appearance: alert, cooperative and appears stated age Ears: normal TM's and external ear canals both ears Throat: lips, mucosa, and tongue normal; teeth and gums normal Neck: no adenopathy, no carotid bruit, supple, symmetrical, trachea midline and thyroid not enlarged, symmetric, no tenderness/mass/nodules Back: symmetric, no curvature. ROM normal. No CVA tenderness. Lungs: clear to auscultation bilaterally Heart: regular rate and rhythm, S1, S2 normal, no murmur, click, rub or gallop Abdomen: soft, non-tender; bowel sounds normal; no masses,  no organomegaly Pulses: 2+ and symmetric Skin: Skin color, texture, turgor normal. No rashes or lesions Lymph nodes: Cervical, supraclavicular, and axillary nodes normal.  Lab Results  Component Value Date   HGBA1C 6.3 (H) 07/14/2020   HGBA1C 6.4 07/02/2020   HGBA1C 6.3 12/29/2019    Lab Results  Component Value Date   CREATININE 0.76 07/20/2020   CREATININE 0.77 07/15/2020   CREATININE 0.86 07/14/2020    Lab Results  Component Value Date   WBC 6.6 07/20/2020   HGB 13.2 07/20/2020   HCT 39.7 07/20/2020   PLT 193.0 07/20/2020   GLUCOSE 148 (H) 07/20/2020   CHOL 142 10/11/2013   TRIG 194.0 (H) 10/11/2013   HDL 43.10 10/11/2013   LDLDIRECT 86.3 06/30/2012   LDLCALC 60 10/11/2013   ALT 10 07/20/2020   AST 14 07/20/2020   NA 137 07/20/2020   K 3.9 07/20/2020   CL 100 07/20/2020   CREATININE 0.76 07/20/2020   BUN 23 07/20/2020   CO2 28 07/20/2020   TSH 3.15 12/29/2019   INR 1.1 07/13/2020   HGBA1C 6.3 (H) 07/14/2020   MICROALBUR <0.7 07/02/2020    DG Hand Complete Left  Result Date: 07/31/2020 CLINICAL DATA:  New onset left hand pain and swelling. The patient suffered  a fall on 07/16/2020. EXAM: LEFT HAND - COMPLETE 3+ VIEW COMPARISON:  None. FINDINGS: The patient has an acute or early subacute nondisplaced diaphyseal fracture of the fifth metacarpal. No other fracture is identified. Scattered osteoarthritis noted. Soft tissues are negative. IMPRESSION: Nondisplaced acute or early subacute diaphyseal fracture of the fifth metacarpal. Electronically Signed   By: Inge Rise M.D.   On: 07/31/2020 14:07   DG Hip Unilat W OR W/O Pelvis 2-3 Views Left  Result Date: 07/31/2020 CLINICAL DATA:  Status post fixation of a left hip fracture the patient suffered in a fall on 06/08/2020. Subsequent encounter. EXAM: DG HIP (WITH OR WITHOUT PELVIS) 2-3V LEFT COMPARISON:  Intraoperative imaging left hip 06/11/2019. FINDINGS: Gamma nail in the left hip is intact and unchanged in position. Position and alignment of the patient's intertrochanteric fracture  are near anatomic. The fracture is healing with some bridging bone identified. No new abnormality. IMPRESSION: Healing left intertrochanteric fracture.  No complicating feature. Electronically Signed   By: Inge Rise M.D.   On: 07/31/2020 14:05    Assessment & Plan:   Problem List Items Addressed This Visit      Unprioritized   E. coli UTI - Primary    Repeat UA/culture notes persistence,  With resistance to ampicillin and fluorquinlones.  Will treat with omnicef  5 days       Relevant Orders   Urinalysis, Routine w reflex microscopic (Completed)   Urine Culture (Completed)   POCT Urinalysis Dipstick (Completed)   Sciatica of right side    Now with right sided hip pain.  Advised to reudce meloxicam to 7.5 mg daily and max out tylenol at 2000 mg daily . Mild degenerative changes with no acute chages by plain films       Type 2 diabetes mellitus with diabetic neuropathy, unspecified ([place])    Her diabetes remains well-controlled on diet alone .  hemoglobin A1c has been consistently at or  less than 7.0 .Patient is   up-to-date on eye exams.  She has no foot  callouses or ulcers and no evidence of  proteinuria. Patient is statin intolerant  and NOT on ACE/ARB for renal protection and hypertension   Lab Results  Component Value Date   HGBA1C 6.3 (H) 07/14/2020   Lab Results  Component Value Date   LABMICR See below: 05/27/2019   LABMICR See below: 11/21/2014   MICROALBUR <0.7 07/02/2020   MICROALBUR 1.0 12/29/2019            Other Visit Diagnoses    Acute low back pain without sciatica, unspecified back pain laterality       Acute right hip pain       Relevant Orders   DG Hip Unilat W OR W/O Pelvis 2-3 Views Right (Completed)   CKD (chronic kidney disease) stage 2, GFR 60-89 ml/min       Relevant Orders   Basic metabolic panel      I am having Daleen Squibb. [place] maintain her cholecalciferol, estradiol, clobetasol cream, cyanocobalamin, triamcinolone cream, diphenoxylate-atropine, metoprolol succinate, [place], meloxicam, acetaminophen-codeine, metoprolol succinate, and acetaminophen.  No orders of the defined types were placed in this encounter.   There are no discontinued medications.  Follow-up: Return in about 3 months (around 01/19/2021).   Crecencio Mc, MD

## 2020-10-21 ENCOUNTER — Encounter: Payer: Self-pay | Admitting: Internal Medicine

## 2020-10-21 ENCOUNTER — Other Ambulatory Visit: Payer: Self-pay | Admitting: Internal Medicine

## 2020-10-21 LAB — URINE CULTURE
MICRO NUMBER:: 11966538
SPECIMEN QUALITY:: ADEQUATE

## 2020-10-21 MED ORDER — CEFDINIR 300 MG PO CAPS: 300.0000 mg | ORAL_CAPSULE | Freq: Two times a day (BID) | ORAL | 0 refills | Status: DC

## 2020-10-21 NOTE — Assessment & Plan Note (Addendum)
Now with right sided hip pain.  Advised to reudce meloxicam to 7.5 mg daily and max out tylenol at 2000 mg daily . Mild degenerative changes with no acute chages by plain films

## 2020-10-21 NOTE — Assessment & Plan Note (Signed)
Her diabetes remains well-controlled on diet alone .  hemoglobin A1c has been consistently at or  less than 7.0 .Patient is  up-to-date on eye exams.  She has no foot  callouses or ulcers and no evidence of  proteinuria. Patient is statin intolerant  and NOT on ACE/ARB for renal protection and hypertension   Lab Results  Component Value Date   HGBA1C 6.3 (H) 07/14/2020   Lab Results  Component Value Date   LABMICR See below: 05/27/2019   LABMICR See below: 11/21/2014   MICROALBUR <0.7 07/02/2020   MICROALBUR 1.0 12/29/2019

## 2020-10-21 NOTE — Assessment & Plan Note (Addendum)
Repeat UA/culture notes persistence,  With resistance to ampicillin and fluorquinlones.  Will treat with omnicef  5 days

## 2020-10-22 ENCOUNTER — Ambulatory Visit (INDEPENDENT_AMBULATORY_CARE_PROVIDER_SITE_OTHER): Payer: Medicare Other | Admitting: *Deleted

## 2020-10-22 DIAGNOSIS — S06300D Unspecified focal traumatic brain injury without loss of consciousness, subsequent encounter: Secondary | ICD-10-CM

## 2020-10-22 DIAGNOSIS — R2689 Other abnormalities of gait and mobility: Secondary | ICD-10-CM | POA: Diagnosis not present

## 2020-10-22 DIAGNOSIS — I10 Essential (primary) hypertension: Secondary | ICD-10-CM | POA: Diagnosis not present

## 2020-10-22 DIAGNOSIS — Z9181 History of falling: Secondary | ICD-10-CM

## 2020-10-22 DIAGNOSIS — R41841 Cognitive communication deficit: Secondary | ICD-10-CM | POA: Diagnosis not present

## 2020-10-22 DIAGNOSIS — S62307D Unspecified fracture of fifth metacarpal bone, left hand, subsequent encounter for fracture with routine healing: Secondary | ICD-10-CM | POA: Diagnosis not present

## 2020-10-22 DIAGNOSIS — R262 Difficulty in walking, not elsewhere classified: Secondary | ICD-10-CM | POA: Diagnosis not present

## 2020-10-22 DIAGNOSIS — E1140 Type 2 diabetes mellitus with diabetic neuropathy, unspecified: Secondary | ICD-10-CM | POA: Diagnosis not present

## 2020-10-22 DIAGNOSIS — B9620 Unspecified Escherichia coli [E. coli] as the cause of diseases classified elsewhere: Secondary | ICD-10-CM

## 2020-10-22 DIAGNOSIS — M5431 Sciatica, right side: Secondary | ICD-10-CM

## 2020-10-22 DIAGNOSIS — S62398D Other fracture of other metacarpal bone, subsequent encounter for fracture with routine healing: Secondary | ICD-10-CM

## 2020-10-22 DIAGNOSIS — M25542 Pain in joints of left hand: Secondary | ICD-10-CM | POA: Diagnosis not present

## 2020-10-22 DIAGNOSIS — S0285XA Fracture of orbit, unspecified, initial encounter for closed fracture: Secondary | ICD-10-CM

## 2020-10-22 DIAGNOSIS — M6281 Muscle weakness (generalized): Secondary | ICD-10-CM | POA: Diagnosis not present

## 2020-10-22 DIAGNOSIS — N390 Urinary tract infection, site not specified: Secondary | ICD-10-CM

## 2020-10-22 LAB — BASIC METABOLIC PANEL
BUN: 27 mg/dL — ABNORMAL HIGH (ref 6–23)
CO2: 29 mEq/L (ref 19–32)
Calcium: 9.5 mg/dL (ref 8.4–10.5)
Chloride: 100 mEq/L (ref 96–112)
Creatinine, Ser: 0.89 mg/dL (ref 0.40–1.20)
GFR: 54.85 mL/min — ABNORMAL LOW (ref >60.00)
Glucose, Bld: 181 mg/dL — ABNORMAL HIGH (ref 70–99)
Potassium: 3.9 mEq/L (ref 3.5–5.1)
Sodium: 141 mEq/L (ref 135–145)

## 2020-10-22 NOTE — Chronic Care Management (AMB) (Signed)
Chronic Care Management   CCM RN Visit Note  10/22/2020 Name: Kara [name] MRN: [id number] DOB: 05/18/1923  Subjective: Kara [name] is a 87 y.o. year old female who is a primary care patient of Derrel Nip, Aris Everts, MD. The care management team was consulted for assistance with disease management and care coordination needs.    Engaged with patient by telephone for follow up visit in response to provider referral for case management and/or care coordination services.   Consent to Services:  The patient was given information about Chronic Care Management services, agreed to services, and gave verbal consent prior to initiation of services.  Please see initial visit note for detailed documentation.   Patient agreed to services and verbal consent obtained.   Assessment: Review of patient past medical history, allergies, medications, health status, including review of consultants reports, laboratory and other test data, was performed as part of comprehensive evaluation and provision of chronic care management services.   SDOH (Social Determinants of Health) assessments and interventions performed:    CCM Care Plan  Allergies  Allergen Reactions  . Baycol [Cerivastatin Sodium]   . Cardizem [Diltiazem Hcl]     LOWER EXTREMITY EDEMA  . Crestor [Rosuvastatin Calcium] Other (See Comments)    INTOLERANT  . Dronedarone Other (See Comments)    Fatigue  . Metoprolol Tartrate Other (See Comments)  . Omeprazole Other (See Comments)  . Pravachol     INTOLERANT  . Statins Other (See Comments)  . Vioxx [Rofecoxib]   . Zocor [Simvastatin]     INTOLERANT    Outpatient Encounter Medications as of 10/22/2020  Medication Sig Note  . meloxicam (MOBIC) 7.5 MG tablet Take 1 tablet (7.5 mg total) by mouth daily. 10/22/2020: Reports taking 7.5 mg daily  . acetaminophen (TYLENOL) 500 MG tablet Take 500 mg by mouth every 6 (six) hours as needed.   Marland Kitchen acetaminophen-codeine (TYLENOL #3) 300-30 MG tablet Take 1-2  tablets by mouth every 6 (six) hours as needed for moderate pain. REPLACES TRAMADOL RX SENT IN TODAY   . cefdinir (OMNICEF) 300 MG capsule Take 1 capsule (300 mg total) by mouth 2 (two) times daily.   . cholecalciferol (VITAMIN D) 1000 UNITS tablet Take 1,000 Units by mouth daily. 07/23/2020: Reports not taking in a while  . clobetasol cream (TEMOVATE) 0.05 % Apply topically.   . cyanocobalamin (,VITAMIN B-12,) 1000 MCG/ML injection INJECT 1ML INTO THE MUSCLE EVERY 14 DAYS   . diphenoxylate-atropine (LOMOTIL) 2.5-0.025 MG tablet Take 1 tablet by mouth 4 (four) times daily as needed for diarrhea or loose stools.   Marland Kitchen estradiol (ESTRACE) 0.1 MG/GM vaginal cream Place vaginally. 09/24/2020: As needed  . Infant Care Products [facility]) OINT Apply 1 application topically as needed.   . metoprolol succinate (TOPROL-XL) 25 MG 24 hr tablet Take 25 mg by mouth in the morning and at bedtime. 05/21/2438: duplicate  . metoprolol succinate (TOPROL-XL) 25 MG 24 hr tablet TAKE 1 TABLET BY MOUTH IN THE MORNING AND AT BEDTIME   . triamcinolone (KENALOG) 0.1 % APPLY 1 APPLICATION TOPICALLY 2 TIMES DAILY TO VULVA UNTIL ITCHING RESOLVES THEN REDUCE USE TO TWICE WEEKLY.    No facility-administered encounter medications on file as of 10/22/2020.    Patient Active Problem List   Diagnosis Date Noted  . Left buttock pain 07/30/2020  . Left hand pain 07/30/2020  . History of recent fall 07/30/2020  . Hospital discharge follow-up 07/22/2020  . Intraparenchymal hemorrhage of brain ([place]) 07/13/2020  . Syncope  and collapse 07/13/2020  . Orbital fracture ([place] 07/13/2020  . Atrial fibrillation, chronic ([place]) 07/13/2020  . HTN (hypertension) 07/13/2020  . DNR (do not resuscitate) 07/13/2020  . S/P placement of cardiac pacemaker 07/03/2020  . Myalgia due to statin 07/03/2020  . Aortic atherosclerosis ([place]) 07/03/2020  . Displaced intertrochanteric fracture of left femur, sequela 06/25/2019  . Age-related osteoporosis with  current pathological fracture with routine healing 06/15/2019  . Sciatica of right side 02/18/2019  . Vaginitis and vulvovaginitis 12/26/2018  . Educated about COVID-19 virus infection 10/12/2018  . Edema of lower extremity 11/24/2017  . Exertional dyspnea 06/16/2017  . Venous insufficiency of both lower extremities 11/24/2016  . Essential hypertension 11/11/2016  . Peripheral artery disease ([place]) 11/11/2016  . Chronic pulmonary hypertension ([place]) 10/20/2016  . Leg swelling 06/17/2016  . Type 2 diabetes mellitus with diabetic neuropathy, unspecified ([place]) 06/17/2016  . Urinary incontinence, urge 12/27/2015  . Atrophic vaginitis 09/26/2015  . Severe tricuspid valve insufficiency 07/04/2015  . Umbilical hernia without obstruction and without gangrene 06/26/2015  . Carpal tunnel syndrome of right wrist 05/29/2015  . Chronic fatigue 05/01/2015  . E. coli UTI 11/21/2014  . Vaginal atrophy 11/21/2014  . Urethral caruncle 11/21/2014  . Mitral valve regurgitation 05/09/2014  . Fatigue 03/09/2014  . SSS (sick sinus syndrome) ([place]) 10/11/2013  . Encounter for current long-term use of anticoagulants 10/06/2012  . Osteoarthritis 03/08/2012  . Screening for breast cancer 03/08/2012  . Pernicious anemia 02/04/2012  . History of colectomy 02/04/2012  . Vitamin D deficiency 07/30/2011  . Acquired thrombophilia ([place]) 06/13/2011  . Coronary atherosclerosis due to calcified coronary lesion 06/13/2011  . Chronic diarrhea 06/13/2011    Conditions to be addressed/monitored:HTN, DMII and Falls  Care Plan : Hypertension (Adult)  Updates made by Leona Singleton, RN since 10/22/2020 12:00 AM  Problem: Hypertension/Diabetes   Priority: Medium  Long-Range Goal: Hypertension Monitored/ Report maintaining Hgb A1C of below 7   Start Date: 07/23/2020  Expected End Date: 04/17/2021  This Visit's Progress: Not on track  Recent Progress: Not on track  Priority: Medium  Objective:  Lab Results  Component  Value Date   HGBA1C 6.3 (H) 07/14/2020   . Last practice recorded BP readings:  BP Readings from Last 3 Encounters:  10/19/20 (!) 150/60  07/30/20 (!) 150/82  07/20/20 128/78  Current Barriers:  Marland Kitchen Knowledge Deficits related to basic understanding of hypertension pathophysiology and self care management as evidenced by patient stating she does not monitor blood pressures at home.  Re discussed monitoring blood pressures at least monthly and asking the nurses at the [facility] to help.  Does not monitor blood sugars.  States she does try to limit her carbohydrate intake.  Attended appointment with primary care this past Friday.  Continues to have urinary tract infection.  Discussed with patient new prescription for antibiotic for continued UTI.  Encouraged patient to contact pharmacy and request prescription be mailed out to her today.  She stated her understanding. . Unable to perform ADLs independently . Unable to perform IADLs independently Nurse Case Manager Clinical Goal(s):  Marland Kitchen Over the next 120 days, patient will verbalize understanding of plan for hypertension management and report maintaining Hgb A1C of below 7 . Over the next 120 days, patient will demonstrate improved adherence to prescribed treatment plan for hypertension as evidenced by taking all medications as prescribed, monitoring and recording blood pressure as directed (monthly), adhering to low sodium/DASH diabetic carbohydrate modified diet Interventions:   .  Collaboration with Deborra Medina regarding development and update of comprehensive plan of care as evidenced by provider attestation and co-signature . Inter-disciplinary care team collaboration (see longitudinal plan of care) . Evaluation of current treatment plan related to hypertension self management and patient's adherence to plan as established by provider. . Provided education to patient re: stroke prevention, s/s of heart attack and stroke, DASH  diet, complications of uncontrolled blood pressure . Reviewed medications with patient and discussed importance of compliance . Discussed plans with patient for ongoing care management follow up and provided patient with direct contact information for care management team . Advised patient, providing education and rationale, to monitor blood pressure monthly and record, calling PCP for findings outside established parameters.  . Reviewed scheduled/upcoming provider appointments including: AWV 10/30/20 . Encouraged patient to request blood pressure checks by nursing staff at [facility] facility . Reviewed last few blood pressure elevations at providers office and discussed need to monitor blood pressures at home to verify trend; discussed importance of blood pressure control . Reviewed chart and discussed continued Urinary tract infection continues to be positive for bacteria and provider proscribing new antibiotic, encouraged patient to request antibiotic be sent from her pharmacy; she stated her understanding . Reviewed signs and symptoms of urinary tract infection and patient encouraged to contact provider if she still continues to have symptoms after second round of antibiotics . Encouraged patient to use 2022 Calendar Booklet previously sent to help document blood pressures and keep track of appointments . Reviewed current Hgb A1C and discussed ways to keep within goal . Reviewed and encouraged carbohydrate modified low salt diabetic diet . Encouraged to increase activity as tolerated Patient Goals/Self-Care Activities:  Over the next 30 days, patient will: . Consider checking blood pressures at least once a month . Ask nursing staff to check your blood pressure once or twice a month . Low salt carbohydrate modified diabetic diet . Limit intake of sweets and sugars . Get antibiotic prescription from pharmacy Follow Up Plan: The care management team will reach out to the patient again over the next 20  business days.     Care Plan : Fall Risk (Adult)  Updates made by Leona Singleton, RN since 10/22/2020 12:00 AM  Problem: Increase fall risk related to resent fall with orbital and facial  fractures   Priority: High  Long-Range Goal: Patient will report no more falls within the next 90 days   Start Date: 07/23/2020  Expected End Date: 04/17/2021  This Visit's Progress: On track  Recent Progress: On track  Priority: High  Current Barriers:  Marland Kitchen Knowledge Deficits related to fall precautions in patient with recent fall resulting in facial and orbital fractures and intracranial hemorrhage with right finger fracture.  States she is still doing home health therapy.  Does report pain in right hip/buttocks area that is sometimes limits her mobility.  Taking Tylenol and Mobic (7.5 mg) to help with pain.  Reports pain is better today than yesterday.  Denies any falls since February. . Decreased adherence to prescribed treatment for fall prevention . Unable to perform ADLs independently at this time, needing assistance . Unable to perform IADLs independently at this time needing assistance Clinical Goal(s):  . patient will demonstrate improved adherence to prescribed treatment plan for decreasing falls as evidenced by patient reporting no falls in the next 45 days and review of EMR . patient will verbalize using fall risk reduction strategies discussed . patient will not experience additional falls . patient  will work with home health therapy to address needs related to fall precautions and increasing strength Interventions:  . Collaboration with Deborra Medina regarding development and update of comprehensive plan of care as evidenced by provider attestation and co-signature . Inter-disciplinary care team collaboration (see longitudinal plan of care) . Provided written and verbal education re: Potential causes of falls and Fall prevention strategies . Reviewed medications and discussed potential side  effects of medications such as dizziness and frequent urination . Assessed patients knowledge of fall risk prevention  . Reviewed scheduled/upcoming provider appointments including:  AWV 10/30/20 . Assistive or adaptive device use encouraged, discussed and encouraged use of walker with all ambulation . Cognition assessed . Fall prevention plan reviewed and updated . Fear of falling, loss of independence and pain acknowledged . Participation in rehabilitation therapy encouraged, encouraged to increase activity slowly as tolerated . Encouraged to do range of motion exercises while sitting . Discussed importance of social interaction and encouraged patient go out of apartment to common areas at least 3 times a week . Emotional support and empathy provided to patient . Discussed pain management and taking medications as prescribed Patient Goals/Self-Care Deficits:   . Call the doctor if I am feeling too drowsy with arthritis medication . Keep my cell phone with me always . Make an emergency alert plan in case I fall . Use a Rolator with all ambulation . Continue to work with home health therapy . Take time and move slowly; try not to rush . Wear you life alert/call bell around your neck at all times Follow Up Plan: RNCM will outreach to patient within the next 20 business days     Plan:The care management team will reach out to the patient again over the next 20 business days.  Hubert Azure RN, MSN RN Care Management Coordinator [place] [phone] [email or web address]

## 2020-10-22 NOTE — Patient Instructions (Signed)
Visit Information  PATIENT GOALS: Goals Addressed            This Visit's Progress   . (RNCM) Manage My Blood Pressure & Diabetes   Not on track    Timeframe:  Long-Range Goal Priority:  Medium Start Date:  07/23/20                           Expected End Date:   04/17/21                    Follow Up Date 11/07/20    . Consider checking blood pressures at least once a month . Ask nursing staff to check your blood pressure once or twice a month . Low salt carbohydrate modified diabetic diet . Limit intake of sweets and sugars . Get antibiotic prescription from pharmacy   Why is this important?    You won't feel high blood pressure, but it can still hurt your blood vessels.   High blood pressure can cause heart or kidney problems. It can also cause a stroke.   Making lifestyle changes like losing a little weight or eating less salt will help.   Checking your blood pressure at home and at different times of the day can help to control blood pressure.   If the doctor prescribes medicine remember to take it the way the doctor ordered.   Call the office if you cannot afford the medicine or if there are questions about it.     Notes:       Marland Kitchen (RNCM) Prevent further Falls and Injury   On track    Timeframe:  Long-Range Goal Priority:  High Start Date:   07/23/20                          Expected End Date:  04/17/21                     Follow Up Date 11/07/20    . Call the doctor if I am feeling too drowsy with arthritis medication . Keep my cell phone with me always . Make an emergency alert plan in case I fall . Use a Rolator with all ambulation . Continue to work with home health therapy . Take time and move slowly; try not to rush . Wear you life alert/call bell around your neck at all times   Why is this important?    Most falls happen when it is hard for you to walk safely. Your balance may be off because of an illness. You may have pain in your knees, hip or other  joints.   You may be overly tired or taking medicines that make you sleepy. You may not be able to see or hear clearly.   Falls can lead to broken bones, bruises or other injuries.   There are things you can do to help prevent falling.     Notes:         Patient verbalizes understanding of instructions provided today and agrees to view in [place].   The care management team will reach out to the patient again over the next 20 business days.   Hubert Azure RN, MSN RN Care Management Coordinator [place] [phone] [email or web address]

## 2020-10-24 DIAGNOSIS — R41841 Cognitive communication deficit: Secondary | ICD-10-CM | POA: Diagnosis not present

## 2020-10-24 DIAGNOSIS — M25542 Pain in joints of left hand: Secondary | ICD-10-CM | POA: Diagnosis not present

## 2020-10-24 DIAGNOSIS — S62307D Unspecified fracture of fifth metacarpal bone, left hand, subsequent encounter for fracture with routine healing: Secondary | ICD-10-CM | POA: Diagnosis not present

## 2020-10-24 DIAGNOSIS — M6281 Muscle weakness (generalized): Secondary | ICD-10-CM | POA: Diagnosis not present

## 2020-10-24 DIAGNOSIS — R262 Difficulty in walking, not elsewhere classified: Secondary | ICD-10-CM | POA: Diagnosis not present

## 2020-10-24 DIAGNOSIS — R2689 Other abnormalities of gait and mobility: Secondary | ICD-10-CM | POA: Diagnosis not present

## 2020-10-25 ENCOUNTER — Telehealth: Payer: Self-pay | Admitting: Internal Medicine

## 2020-10-25 DIAGNOSIS — M5431 Sciatica, right side: Secondary | ICD-10-CM

## 2020-10-25 NOTE — Telephone Encounter (Signed)
PT called in to inform that her back pain that she was seen for has gotten worse since she was here. She would like to find out what can be done to help her.

## 2020-10-29 DIAGNOSIS — R2689 Other abnormalities of gait and mobility: Secondary | ICD-10-CM | POA: Diagnosis not present

## 2020-10-29 DIAGNOSIS — M6281 Muscle weakness (generalized): Secondary | ICD-10-CM | POA: Diagnosis not present

## 2020-10-29 DIAGNOSIS — R262 Difficulty in walking, not elsewhere classified: Secondary | ICD-10-CM | POA: Diagnosis not present

## 2020-10-29 DIAGNOSIS — R41841 Cognitive communication deficit: Secondary | ICD-10-CM | POA: Diagnosis not present

## 2020-10-29 DIAGNOSIS — M25542 Pain in joints of left hand: Secondary | ICD-10-CM | POA: Diagnosis not present

## 2020-10-29 DIAGNOSIS — S62307D Unspecified fracture of fifth metacarpal bone, left hand, subsequent encounter for fracture with routine healing: Secondary | ICD-10-CM | POA: Diagnosis not present

## 2020-10-29 NOTE — Telephone Encounter (Signed)
PT would like to get some advise on the back pain that has gotten worse. She would like to find out if she can even be referred to a doctor for her back.

## 2020-10-30 ENCOUNTER — Telehealth: Payer: Self-pay

## 2020-10-30 ENCOUNTER — Ambulatory Visit: Payer: Medicare Other

## 2020-10-30 DIAGNOSIS — M533 Sacrococcygeal disorders, not elsewhere classified: Secondary | ICD-10-CM | POA: Diagnosis not present

## 2020-10-30 NOTE — Telephone Encounter (Signed)
Pt called back and canceled appt stating to the front office that she is going to go to the walk in clinic.

## 2020-10-30 NOTE — Telephone Encounter (Signed)
Spoke with pt to see about scheduling her for an appt to discuss the worsening back pain. Pt stated that she was in so much back pain this morning that she didn't think she would be able to get out of the bed. Pt stated that she finally did but is in a lot of pain. Pt was advised that since she is in that much pain that she should be evaluated at either the ED, UC or she could go to [facility] walk-in today from 1pm - 7pm. Pt stated that she would not go to the ED because she doesn't want to sit there all day. Pt wanted to schedule an appt with Dr. Derrel Nip but there is no available appts until later next week. Pt scheduled with Dr. Olivia Mackie tomorrow at 3:30pm for  a telephone visit. Pt was advised that if she decided to go to UC or emergortho to give [place] a call so we could cancel her appt for tomorrow.

## 2020-10-30 NOTE — Telephone Encounter (Signed)
Unable to reach patient for scheduled awv on preferred number. Reschedule as appropriate.

## 2020-10-30 NOTE — Telephone Encounter (Signed)
Noted  

## 2020-10-31 ENCOUNTER — Telehealth: Payer: BLUE CROSS/BLUE SHIELD | Admitting: Internal Medicine

## 2020-11-01 DIAGNOSIS — R41841 Cognitive communication deficit: Secondary | ICD-10-CM | POA: Diagnosis not present

## 2020-11-01 DIAGNOSIS — R2689 Other abnormalities of gait and mobility: Secondary | ICD-10-CM | POA: Diagnosis not present

## 2020-11-01 DIAGNOSIS — R262 Difficulty in walking, not elsewhere classified: Secondary | ICD-10-CM | POA: Diagnosis not present

## 2020-11-01 DIAGNOSIS — S62307D Unspecified fracture of fifth metacarpal bone, left hand, subsequent encounter for fracture with routine healing: Secondary | ICD-10-CM | POA: Diagnosis not present

## 2020-11-01 DIAGNOSIS — M25542 Pain in joints of left hand: Secondary | ICD-10-CM | POA: Diagnosis not present

## 2020-11-01 DIAGNOSIS — M6281 Muscle weakness (generalized): Secondary | ICD-10-CM | POA: Diagnosis not present

## 2020-11-05 ENCOUNTER — Other Ambulatory Visit: Payer: Self-pay | Admitting: Internal Medicine

## 2020-11-05 DIAGNOSIS — R262 Difficulty in walking, not elsewhere classified: Secondary | ICD-10-CM | POA: Diagnosis not present

## 2020-11-05 DIAGNOSIS — R41841 Cognitive communication deficit: Secondary | ICD-10-CM | POA: Diagnosis not present

## 2020-11-05 DIAGNOSIS — M25542 Pain in joints of left hand: Secondary | ICD-10-CM | POA: Diagnosis not present

## 2020-11-05 DIAGNOSIS — R2689 Other abnormalities of gait and mobility: Secondary | ICD-10-CM | POA: Diagnosis not present

## 2020-11-05 DIAGNOSIS — S62307D Unspecified fracture of fifth metacarpal bone, left hand, subsequent encounter for fracture with routine healing: Secondary | ICD-10-CM | POA: Diagnosis not present

## 2020-11-05 DIAGNOSIS — M6281 Muscle weakness (generalized): Secondary | ICD-10-CM | POA: Diagnosis not present

## 2020-11-05 DIAGNOSIS — M5431 Sciatica, right side: Secondary | ICD-10-CM

## 2020-11-05 MED ORDER — TRAMADOL HCL 50 MG PO TABS: 50.0000 mg | ORAL_TABLET | Freq: Two times a day (BID) | ORAL | 0 refills | Status: DC | PRN

## 2020-11-05 NOTE — Telephone Encounter (Signed)
Pt called and states that she went to the [facility] walk in on 10/30/20 and they told her she had joint inflammation in her back. She states that she has already taken all of the prednisone that was prescribed to her and she is still in severe pain. No avail appts today at our location. Transferred to [place] at Access Nurse to triage and let her know that we have no available appts available at the time of call.

## 2020-11-05 NOTE — Telephone Encounter (Signed)
I have sent an rx for tramadol  to take every 12 hours.  She needs to  take tylenol 1000 mg every 12 hours .  Dr Sharlet Salina referral has been made for an injection in her back,  but she will likely need an MRI of the lumbar spine before he will do procedure that involves putting a needle into her spine so I will order the MRi as well

## 2020-11-05 NOTE — Telephone Encounter (Signed)
Pt called into access nurse c/o ongoing lower right back pain extending down to the right leg. Pt was transferred to me since there were no appointments available for today. Spoke to [place], Therapist, sports from access nurse who informed me that the Triage order was for the patient to be seen in the next 4 hours. Was transferred over to the patient to speak. Kara [name] states that she has been in pain for the last 3 weeks and she is calling the office for assistance. She states that the pain is so bad she can not move around much or get out of bed. She states that she was taught by a therapist on how to roll out of bed but she still feels pain through the night or when she stands up. Kara [name] states that she has previously spoken to Dr. Derrel Nip about a referral to a back doctor in order to receive a back injection She asks if that is still available and if she can receive that referral. Pt states the pain is at a 10 on the pain scale. Pt spoke with Janett Billow on 10/30/20 and was instructed to go to Urgent Care. She was evaluated at [facility] on 10/30/20. Pt states that it did not help. I offered for her to go see urgent care as the pain is still continuing without relief. Kara [name] declines and states that she does not want to go due to the pain. She states that she has transportation issues and needs to schedule in advance. I offered her an available appointment with Dr. Caryl Bis on 11/07/20 at 8 am for a Telephone visit as she states that she could not come in office at that time. Kara [name] is agreeable but asks if there is anything sooner or if there is anything that can be done for the back pain until then. She also asks if we can offer back injections in office.

## 2020-11-05 NOTE — Telephone Encounter (Signed)
Attempted to contact Kara [name] to discuss Dr. Lupita Dawn message. Called 3 times but number did not connect and went straight to dial tone. Checked the number from the access Nurse documentation submitted and from the received call documentation from [place] this morning and confirmed it is the same number. Patient has no DPR listing agreement to call any other number on her demographics. Could not leave a message to call back.

## 2020-11-05 NOTE — Telephone Encounter (Signed)
Providing Access Nurse Documentation.   

## 2020-11-05 NOTE — Addendum Note (Signed)
Addended by: Crecencio Mc on: 11/05/2020 12:55 PM   Modules accepted: Orders

## 2020-11-06 ENCOUNTER — Telehealth: Payer: Self-pay | Admitting: Internal Medicine

## 2020-11-06 ENCOUNTER — Other Ambulatory Visit: Payer: Self-pay

## 2020-11-06 ENCOUNTER — Other Ambulatory Visit: Payer: Self-pay | Admitting: Internal Medicine

## 2020-11-06 ENCOUNTER — Ambulatory Visit
Admission: RE | Admit: 2020-11-06 | Discharge: 2020-11-06 | Disposition: A | Discharge status: Home / Self Care | Payer: Medicare Other | Source: Ambulatory Visit | Attending: Internal Medicine | Admitting: Internal Medicine

## 2020-11-06 DIAGNOSIS — M5431 Sciatica, right side: Secondary | ICD-10-CM | POA: Diagnosis not present

## 2020-11-06 DIAGNOSIS — M48061 Spinal stenosis, lumbar region without neurogenic claudication: Secondary | ICD-10-CM | POA: Diagnosis not present

## 2020-11-06 DIAGNOSIS — M5116 Intervertebral disc disorders with radiculopathy, lumbar region: Secondary | ICD-10-CM | POA: Diagnosis not present

## 2020-11-06 NOTE — Telephone Encounter (Signed)
Just wanted to check, but this has been somewhat taken care of right? It looks like you have tried ri call patient this morning?

## 2020-11-06 NOTE — Telephone Encounter (Signed)
Lft vm for pt to call ofc regarding her MRI. thanks

## 2020-11-07 ENCOUNTER — Telehealth: Payer: BLUE CROSS/BLUE SHIELD

## 2020-11-07 ENCOUNTER — Telehealth: Payer: BLUE CROSS/BLUE SHIELD | Admitting: Family Medicine

## 2020-11-07 ENCOUNTER — Telehealth: Payer: Self-pay | Admitting: *Deleted

## 2020-11-07 DIAGNOSIS — M1611 Unilateral primary osteoarthritis, right hip: Secondary | ICD-10-CM | POA: Diagnosis not present

## 2020-11-07 DIAGNOSIS — M5441 Lumbago with sciatica, right side: Secondary | ICD-10-CM | POA: Diagnosis not present

## 2020-11-07 DIAGNOSIS — M25551 Pain in right hip: Secondary | ICD-10-CM | POA: Diagnosis not present

## 2020-11-07 DIAGNOSIS — S32000A Wedge compression fracture of unspecified lumbar vertebra, initial encounter for closed fracture: Secondary | ICD-10-CM | POA: Diagnosis not present

## 2020-11-07 DIAGNOSIS — M545 Low back pain, unspecified: Secondary | ICD-10-CM | POA: Diagnosis not present

## 2020-11-07 NOTE — Telephone Encounter (Signed)
  Care Management   Follow Up Note   11/07/2020 Name: Kara [name] MRN: [id number] DOB: 1922-10-07   Referred by: Crecencio Mc, MD Reason for referral : Chronic Care Management (HTN, Falls, UTI)   Unsuccessful telephone outreach to patient.  Upon chart review, RNCM noticed patient at appointment with another provider at the [facility].  RNCM will reschedule this appointment  Follow Up Plan: The care management team will reach out to the patient again over the next 20 business days.   Hubert Azure RN, MSN RN Care Management Coordinator [place] [phone] [email or web address]

## 2020-11-08 ENCOUNTER — Encounter: Payer: Self-pay | Admitting: Internal Medicine

## 2020-11-08 ENCOUNTER — Telehealth: Payer: Self-pay | Admitting: Internal Medicine

## 2020-11-08 DIAGNOSIS — S32020A Wedge compression fracture of second lumbar vertebra, initial encounter for closed fracture: Secondary | ICD-10-CM

## 2020-11-08 DIAGNOSIS — M48061 Spinal stenosis, lumbar region without neurogenic claudication: Secondary | ICD-10-CM | POA: Insufficient documentation

## 2020-11-08 HISTORY — DX: Wedge compression fracture of second lumbar vertebra, initial encounter for closed fracture: S32.020A

## 2020-11-08 NOTE — Telephone Encounter (Signed)
Spoke with pt to let her know that se has a compression fracture and spinal cord compression due to a bulging disk in her spine and advised her that Dr. Derrel Nip recommends she go to the ED for pain management and an orthopedic evaluation. Pt stated that she can not go today because she does not have transportation. I advised the pt that she could always call 911. Pt stated that she would go tomorrow.

## 2020-11-08 NOTE — Telephone Encounter (Signed)
She has acute compression fracture of L2 based on the CT scan, along with spinal cord compression at a lower level from a bulging disk.  I don't think her pain can be managed at home so I am recommending that she go tot the ER to be admitted for pain management which will include and orthoepdic evaluation  for a vertebroplasty to alleviate the pain from the collapsed vertebrae

## 2020-11-09 ENCOUNTER — Emergency Department
Admission: EM | Admit: 2020-11-09 | Discharge: 2020-11-09 | Disposition: A | Discharge status: Home / Self Care | Payer: Medicare Other | Attending: Emergency Medicine | Admitting: Emergency Medicine

## 2020-11-09 ENCOUNTER — Other Ambulatory Visit: Payer: Self-pay

## 2020-11-09 DIAGNOSIS — E1169 Type 2 diabetes mellitus with other specified complication: Secondary | ICD-10-CM | POA: Diagnosis not present

## 2020-11-09 DIAGNOSIS — E1140 Type 2 diabetes mellitus with diabetic neuropathy, unspecified: Secondary | ICD-10-CM | POA: Insufficient documentation

## 2020-11-09 DIAGNOSIS — S32020A Wedge compression fracture of second lumbar vertebra, initial encounter for closed fracture: Secondary | ICD-10-CM | POA: Diagnosis not present

## 2020-11-09 DIAGNOSIS — E785 Hyperlipidemia, unspecified: Secondary | ICD-10-CM | POA: Diagnosis not present

## 2020-11-09 DIAGNOSIS — E1151 Type 2 diabetes mellitus with diabetic peripheral angiopathy without gangrene: Secondary | ICD-10-CM | POA: Diagnosis not present

## 2020-11-09 DIAGNOSIS — Z79899 Other long term (current) drug therapy: Secondary | ICD-10-CM | POA: Insufficient documentation

## 2020-11-09 DIAGNOSIS — Z87891 Personal history of nicotine dependence: Secondary | ICD-10-CM | POA: Diagnosis not present

## 2020-11-09 DIAGNOSIS — Z923 Personal history of irradiation: Secondary | ICD-10-CM | POA: Diagnosis not present

## 2020-11-09 DIAGNOSIS — E1139 Type 2 diabetes mellitus with other diabetic ophthalmic complication: Secondary | ICD-10-CM | POA: Insufficient documentation

## 2020-11-09 DIAGNOSIS — Z85828 Personal history of other malignant neoplasm of skin: Secondary | ICD-10-CM | POA: Insufficient documentation

## 2020-11-09 DIAGNOSIS — I2510 Atherosclerotic heart disease of native coronary artery without angina pectoris: Secondary | ICD-10-CM | POA: Diagnosis not present

## 2020-11-09 DIAGNOSIS — Z8542 Personal history of malignant neoplasm of other parts of uterus: Secondary | ICD-10-CM | POA: Insufficient documentation

## 2020-11-09 DIAGNOSIS — W19XXXA Unspecified fall, initial encounter: Secondary | ICD-10-CM | POA: Diagnosis not present

## 2020-11-09 DIAGNOSIS — S3992XA Unspecified injury of lower back, initial encounter: Secondary | ICD-10-CM | POA: Diagnosis present

## 2020-11-09 DIAGNOSIS — Z950 Presence of cardiac pacemaker: Secondary | ICD-10-CM | POA: Insufficient documentation

## 2020-11-09 LAB — URINALYSIS, COMPLETE (UACMP) WITH MICROSCOPIC
Bilirubin Urine: NEGATIVE
Glucose, UA: NEGATIVE mg/dL
Ketones, ur: NEGATIVE mg/dL
Leukocytes,Ua: NEGATIVE
Nitrite: NEGATIVE
Protein, ur: NEGATIVE mg/dL
Specific Gravity, Urine: 1.005 (ref 1.005–1.030)
pH: 7 (ref 5.0–8.0)

## 2020-11-09 LAB — CBC WITH DIFFERENTIAL/PLATELET
Abs Immature Granulocytes: 0.03 10*3/uL (ref 0.00–0.07)
Basophils Absolute: 0 10*3/uL (ref 0.0–0.1)
Basophils Relative: 0 %
Eosinophils Absolute: 0 10*3/uL (ref 0.0–0.5)
Eosinophils Relative: 0 %
HCT: 44.4 % (ref 36.0–46.0)
Hemoglobin: 15.1 g/dL — ABNORMAL HIGH (ref 12.0–15.0)
Immature Granulocytes: 0 %
Lymphocytes Relative: 21 %
Lymphs Abs: 2.2 10*3/uL (ref 0.7–4.0)
MCH: 31.1 pg (ref 26.0–34.0)
MCHC: 34 g/dL (ref 30.0–36.0)
MCV: 91.4 fL (ref 80.0–100.0)
Monocytes Absolute: 0.4 10*3/uL (ref 0.1–1.0)
Monocytes Relative: 4 %
Neutro Abs: 7.9 10*3/uL — ABNORMAL HIGH (ref 1.7–7.7)
Neutrophils Relative %: 75 %
Platelets: 211 10*3/uL (ref 150–400)
RBC: 4.86 MIL/uL (ref 3.87–5.11)
RDW: 12.6 % (ref 11.5–15.5)
WBC: 10.5 10*3/uL (ref 4.0–10.5)
nRBC: 0 % (ref 0.0–0.2)

## 2020-11-09 LAB — BASIC METABOLIC PANEL
Anion gap: 10 (ref 5–15)
BUN: 28 mg/dL — ABNORMAL HIGH (ref 8–23)
CO2: 33 mmol/L — ABNORMAL HIGH (ref 22–32)
Calcium: 9.4 mg/dL (ref 8.9–10.3)
Chloride: 96 mmol/L — ABNORMAL LOW (ref 98–111)
Creatinine, Ser: 0.71 mg/dL (ref 0.44–1.00)
GFR, Estimated: >60 mL/min (ref >60)
Glucose, Bld: 158 mg/dL — ABNORMAL HIGH (ref 70–99)
Potassium: 3.1 mmol/L — ABNORMAL LOW (ref 3.5–5.1)
Sodium: 139 mmol/L (ref 135–145)

## 2020-11-09 MED ORDER — DOCUSATE SODIUM 100 MG PO CAPS: 100.0000 mg | ORAL_CAPSULE | Freq: Every day | ORAL | 0 refills | Status: DC

## 2020-11-09 MED ORDER — MORPHINE SULFATE (PF) 2 MG/ML IV SOLN
2.0000 mg | Freq: Once | INTRAVENOUS | Status: AC
Start: 2020-11-09 — End: 2020-11-09
  Administered 2020-11-09: 2 mg via INTRAVENOUS
  Filled 2020-11-09: qty 1

## 2020-11-09 MED ORDER — ACETAMINOPHEN 500 MG PO TABS
1000.0000 mg | ORAL_TABLET | Freq: Once | ORAL | Status: AC
  Administered 2020-11-09: 1000 mg via ORAL
  Filled 2020-11-09: qty 2

## 2020-11-09 MED ORDER — OXYCODONE HCL 5 MG PO TABS: 2.5000 mg | ORAL_TABLET | Freq: Three times a day (TID) | ORAL | 0 refills | Status: DC | PRN

## 2020-11-09 NOTE — Discharge Instructions (Addendum)
For your pain:  Take TYLENOL every 4 hours when awake, as you have been doing.  Take the OXYCODONE 2.5 mg with food every 8 hours when awake for the next 2-3 days, then as needed.  You can take an ADDITIONAL 2.5 mg (half tablet) up to twice a day for SEVERE pain  I'd recommend STOPPING the prednisone and muscle relaxants started by your other physician

## 2020-11-09 NOTE — ED Provider Notes (Signed)
[facility] Emergency Department Provider Note  ____________________________________________   Event Date/Time   First MD Initiated Contact with Patient 11/09/20 1231     (approximate)  I have reviewed the triage vital signs and the nursing notes.   HISTORY  Chief Complaint Back Pain    HPI Kara [name] is a 87 y.o. female here with back pain.  The patient has had back pain for the last 4 weeks.  She has been worked up extensively by her PCP for this and was recently sent for a CT scan which showed lumbar compression fracture.  She was told to come in because of her worsening pain today.  She states the pain is moderately worse, though this is been chronically worsening and not is not acutely any more severe.  She said no lower extremity weakness or numbness.  She lives alone, and has been able to ambulate though it is painful.  No lower extremity numbness or weakness.  No loss of bowel or bladder function.  No fevers or chills.  She has been prescribed prednisone, multiple other meds by her PCP and feels like these are not helping.  No alleviating factors other than rest.    Past Medical History:  Diagnosis Date   Allergy    Atrial fibrillation (HCC)    CAD (coronary artery disease)    Cancer (HCC)    UTERINE   Chronic anticoagulation    Chronic diarrhea    Decubitus ulcer    sacral region   Diabetes ([place])    diet controlled   Dysuria    Edema of lower extremity    mainly right foot, slightly in left foot.   Facial fracture due to fall ([place]) 07/13/2020   Femur fracture, left ([place]) 06/09/2019   Fibrocystic breast disease    GERD (gastroesophageal reflux disease)    Glaucoma    Glaucoma    Hematuria    Hemorrhoids    History of colon polyps    History of pancreatitis    Hyperlipidemia    Hypertension    Hypokalemia    IBS (irritable bowel syndrome)    Microscopic hematuria    Mitral valve regurgitation    Osteoarthritis    fingers    Pernicious anemia    Plantar fasciitis    Recurrent UTI    Skin cancer    Sleep apnea, obstructive    Vaginal atrophy    Vitamin D deficiency    Yeast vaginitis     Patient Active Problem List   Diagnosis Date Noted   Closed compression fracture of L2 lumbar vertebra, initial encounter ([place]) 11/08/2020   Lumbar spinal stenosis 11/08/2020   Left buttock pain 07/30/2020   Left hand pain 07/30/2020   History of recent fall 07/30/2020   Hospital discharge follow-up 07/22/2020   Intraparenchymal hemorrhage of brain ([place]) 07/13/2020   Syncope and collapse 07/13/2020   Orbital fracture ([place]) 07/13/2020   Atrial fibrillation, chronic ([place]) 07/13/2020   HTN (hypertension) 07/13/2020   DNR (do not resuscitate) 07/13/2020   S/P placement of cardiac pacemaker 07/03/2020   Myalgia due to statin 07/03/2020   Aortic atherosclerosis ([place]) 07/03/2020   Displaced intertrochanteric fracture of left femur, sequela 06/25/2019   Age-related osteoporosis with current pathological fracture with routine healing 06/15/2019   Sciatica of right side 02/18/2019   Vaginitis and vulvovaginitis 12/26/2018   Educated about COVID-19 virus infection 10/12/2018   Edema of lower extremity 11/24/2017   Exertional dyspnea 06/16/2017  Venous insufficiency of both lower extremities 11/24/2016   Essential hypertension 11/11/2016   Peripheral artery disease ([place]) 11/11/2016   Chronic pulmonary hypertension ([place]) 10/20/2016   Leg swelling 06/17/2016   Type 2 diabetes mellitus with diabetic neuropathy, unspecified ([place]) 06/17/2016   Urinary incontinence, urge 12/27/2015   Atrophic vaginitis 09/26/2015   Severe tricuspid valve insufficiency 95/28/4132   Umbilical hernia without obstruction and without gangrene 06/26/2015   Carpal tunnel syndrome of right wrist 05/29/2015   Chronic fatigue 05/01/2015   E. coli UTI 11/21/2014   Vaginal atrophy 11/21/2014   Urethral caruncle 11/21/2014   Mitral valve regurgitation  05/09/2014   Fatigue 03/09/2014   SSS (sick sinus syndrome) ([place]) 10/11/2013   Encounter for current long-term use of anticoagulants 10/06/2012   Osteoarthritis 03/08/2012   Screening for breast cancer 03/08/2012   Pernicious anemia 02/04/2012   History of colectomy 02/04/2012   Vitamin D deficiency 07/30/2011   Acquired thrombophilia ([place]) 06/13/2011   Coronary atherosclerosis due to calcified coronary lesion 06/13/2011   Chronic diarrhea 06/13/2011    Past Surgical History:  Procedure Laterality Date   ABDOMINAL HYSTERECTOMY  1980's   ABDOMINAL SURGERY     for villous polyp,,,many years ago   APPENDECTOMY  1940's   ASCAD, s/p PTCA  11/28/2005   MID LESION    BREAST BIOPSY Left 1970's   CARPAL TUNNEL RELEASE Right 12/13/2015   Procedure: CARPAL TUNNEL RELEASE;  Surgeon: Thornton Park, MD;  Location: [facility] ORS;  Service: Orthopedics;  Laterality: Right;   COLECTOMY  2015   INTRAMEDULLARY (IM) NAIL INTERTROCHANTERIC Left 06/11/2019   Procedure: INTRAMEDULLARY (IM) NAIL INTERTROCHANTRIC;  Surgeon: Earnestine Leys, MD;  Location: [facility] ORS;  Service: Orthopedics;  Laterality: Left;   PACEMAKER PLACEMENT     radation     for uterine cance   REFRACTIVE SURGERY     for bilateral glaumoma   TONSILLECTOMY  1936    Prior to Admission medications   Medication Sig Start Date End Date Taking? Authorizing Provider  docusate sodium (COLACE) 100 MG capsule Take 1 capsule (100 mg total) by mouth daily for 10 days. While taking the oxycodone 11/09/20 11/19/20 Yes Duffy Bruce, MD  oxyCODONE (ROXICODONE) 5 MG immediate release tablet Take 0.5 tablets (2.5 mg total) by mouth every 8 (eight) hours as needed for severe pain or breakthrough pain. 11/09/20 11/09/21 Yes Duffy Bruce, MD  acetaminophen (TYLENOL) 500 MG tablet Take 500 mg by mouth every 6 (six) hours as needed.    [provider]  cefdinir (OMNICEF) 300 MG capsule Take 1 capsule (300 mg total) by mouth 2 (two) times daily.  10/21/20   Crecencio Mc, MD  cholecalciferol (VITAMIN D) 1000 UNITS tablet Take 1,000 Units by mouth daily.    [provider]  clobetasol cream (TEMOVATE) 0.05 % Apply topically. 12/24/18   [provider]  cyanocobalamin (,VITAMIN B-12,) 1000 MCG/ML injection INJECT 1ML INTO THE MUSCLE EVERY 14 DAYS 04/10/20   Crecencio Mc, MD  diphenoxylate-atropine (LOMOTIL) 2.5-0.025 MG tablet Take 1 tablet by mouth 4 (four) times daily as needed for diarrhea or loose stools.    [provider]  estradiol (ESTRACE) 0.1 MG/GM vaginal cream Place vaginally. 12/13/14   [provider]  [facility]) OINT Apply 1 application topically as needed. 07/20/20   Crecencio Mc, MD  meloxicam (MOBIC) 7.5 MG tablet Take 1 tablet (7.5 mg total) by mouth daily. 07/30/20   Crecencio Mc, MD  metoprolol succinate (TOPROL-XL) 25  MG 24 hr tablet Take 25 mg by mouth in the morning and at bedtime.    [provider]  metoprolol succinate (TOPROL-XL) 25 MG 24 hr tablet TAKE 1 TABLET BY MOUTH IN THE MORNING AND AT BEDTIME 08/24/20   Crecencio Mc, MD  triamcinolone (KENALOG) 0.1 % APPLY 1 APPLICATION TOPICALLY 2 TIMES DAILY TO VULVA UNTIL ITCHING RESOLVES THEN REDUCE USE TO TWICE WEEKLY. 05/31/20   Crecencio Mc, MD    Allergies Baycol [cerivastatin sodium], Cardizem [diltiazem hcl], Crestor [rosuvastatin calcium], Dronedarone, Metoprolol tartrate, Omeprazole, Pravachol, Statins, Vioxx [rofecoxib], and Zocor [simvastatin]  Family History  Problem Relation Age of Onset   Coronary artery disease Father    Kidney disease Father    Cancer Sister    Diabetes Other    Cancer Mother        COLON   Bladder Cancer Neg Hx    Breast cancer Neg Hx     Social History Social History   Tobacco Use   Smoking status: Former    Pack years: 0.00   Smokeless tobacco: Never   Tobacco comments:    quit 45 years ago  Vaping Use   Vaping Use: Never used  Substance Use  Topics   Alcohol use: Yes    Alcohol/week: 0.0 standard drinks    Comment: Rarely, 1-2 times a year   Drug use: No    Review of Systems  Review of Systems  Constitutional:  Positive for fatigue. Negative for fever.  HENT:  Negative for congestion and sore throat.   Eyes:  Negative for visual disturbance.  Respiratory:  Negative for cough and shortness of breath.   Cardiovascular:  Negative for chest pain.  Gastrointestinal:  Negative for abdominal pain, diarrhea, nausea and vomiting.  Genitourinary:  Negative for flank pain.  Musculoskeletal:  Positive for back pain. Negative for neck pain.  Skin:  Negative for rash and wound.  Neurological:  Negative for weakness.  All other systems reviewed and are negative.   ____________________________________________  PHYSICAL EXAM:      VITAL SIGNS: ED Triage Vitals  Enc Vitals Group     BP 11/09/20 1114 (!) 158/83     Pulse Rate 11/09/20 1114 91     Resp 11/09/20 1114 18     Temp 11/09/20 1114 97.7 F (36.5 C)     Temp Source 11/09/20 1114 Oral     SpO2 11/09/20 1114 96 %     Weight 11/09/20 1120 122 lb (55.3 kg)     Height 11/09/20 1120 5\' 2"  (1.575 m)     Head Circumference --      Peak Flow --      Pain Score 11/09/20 1120 7     Pain Loc --      Pain Edu? --      Excl. in [place]? --      Physical Exam Vitals and nursing note reviewed.  Constitutional:      General: She is not in acute distress.    Appearance: She is well-developed.  HENT:     Head: Normocephalic and atraumatic.  Eyes:     Conjunctiva/sclera: Conjunctivae normal.  Cardiovascular:     Rate and Rhythm: Normal rate and regular rhythm.     Heart sounds: Normal heart sounds.  Pulmonary:     Effort: Pulmonary effort is normal. No respiratory distress.     Breath sounds: No wheezing.  Abdominal:     General: There is no distension.  Musculoskeletal:  Cervical back: Neck supple.     Comments: Moderate paraspinal lower lumbar tenderness.  No deformity.   Strength out of 5 bilateral lower extremities.  Normal quad reflexes bilaterally.  Sensation intact distal lower extremities bilaterally.  Skin:    General: Skin is warm.     Capillary Refill: Capillary refill takes less than 2 seconds.     Findings: No rash.  Neurological:     Mental Status: She is alert and oriented to person, place, and time.     Motor: No abnormal muscle tone.      ____________________________________________   LABS (all labs ordered are listed, but only abnormal results are displayed)  Labs Reviewed  CBC WITH DIFFERENTIAL/PLATELET - Abnormal; Notable for the following components:      Result Value   Hemoglobin 15.1 (*)    Neutro Abs 7.9 (*)    All other components within normal limits  BASIC METABOLIC PANEL - Abnormal; Notable for the following components:   Potassium 3.1 (*)    Chloride 96 (*)    CO2 33 (*)    Glucose, Bld 158 (*)    BUN 28 (*)    All other components within normal limits  URINALYSIS, COMPLETE (UACMP) WITH MICROSCOPIC    ____________________________________________  EKG:  ________________________________________  RADIOLOGY All imaging, including plain films, CT scans, and ultrasounds, independently reviewed by me, and interpretations confirmed via formal radiology reads.  ED MD interpretation:   CT reviewed from yesterday, shows L2 compression fracture, with 25% height loss.  Minimal retropulsion noted but no obvious canal deformity or obstruction.  Official radiology report(s): No results found.  ____________________________________________  PROCEDURES   Procedure(s) performed (including Critical Care):  Procedures  ____________________________________________  INITIAL IMPRESSION / MDM / [place] / ED COURSE  As part of my medical decision making, I reviewed the following data within the [facility] notes reviewed and incorporated, Old chart reviewed, Notes from prior ED visits, and  [place] Controlled Substance Database       *Kara [name] was evaluated in Emergency Department on 11/09/2020 for the symptoms described in the history of present illness. She was evaluated in the context of the global COVID-19 pandemic, which necessitated consideration that the patient might be at risk for infection with the SARS-CoV-2 virus that causes COVID-19. Institutional protocols and algorithms that pertain to the evaluation of patients at risk for COVID-19 are in a state of rapid change based on information released by regulatory bodies including the CDC and federal and state organizations. These policies and algorithms were followed during the patient's care in the ED.  Some ED evaluations and interventions may be delayed as a result of limited staffing during the pandemic.*     Medical Decision Making: 87 year old female here with acute on chronic lower back pain.  No new falls.  No lower extremity weakness, numbness, or signs of cauda equina or acute radiculopathy.  I reviewed her CT scans from yesterday, and discussed these with Dr. Cari Caraway of neurosurgery as well.  Patient already has an LSO brace.  She was given analgesia here, and feels markedly improved and is ambulatory without difficulty.  I see no other apparent emergent indications for surgery, as does Dr. Cari Caraway.  She may be a candidate for outpatient kyphoplasty.  I discussed admission for pain control and PT while awaiting evaluation, versus discharge, and patient is adamant she would like to return home.  I feel this is reasonable.  She has demonstrated excellent  response to a low-dose of morphine here.  Will have her schedule Tylenol at home, and start a low-dose of oxycodone with stool softener at home.  She will stop her prednisone and muscle relaxants, as I suspect they are only causing additional side effects at this time.  Return precautions given.  She will notify her friend who will be able to stay with her, and patient  can go to the skilled nursing section of her facility if she desires.  Denies any urinary symptoms or signs of UTI, do not suspect this is referred from GU source.  ____________________________________________  FINAL CLINICAL IMPRESSION(S) / ED DIAGNOSES  Final diagnoses:  Compression fracture of L2 vertebra, initial encounter [facility])     MEDICATIONS GIVEN DURING THIS VISIT:  Medications  morphine 2 MG/ML injection 2 mg (2 mg Intravenous Given 11/09/20 1409)  acetaminophen (TYLENOL) tablet 1,000 mg (1,000 mg Oral Given 11/09/20 1408)     ED Discharge Orders          Ordered    oxyCODONE (ROXICODONE) 5 MG immediate release tablet  Every 8 hours PRN        11/09/20 1513    docusate sodium (COLACE) 100 MG capsule  Daily        11/09/20 1516             Note:  This document was prepared using Dragon voice recognition software and may include unintentional dictation errors.   Duffy Bruce, MD 11/09/20 1520

## 2020-11-09 NOTE — TOC Progression Note (Signed)
Transition of Care [facility]) - Progression Note    Patient Details  Name: Kara [name] MRN: [id number] Date of Birth: May 26, 1922  Transition of Care [facility]) CM/SW [place], [place] Phone Number: [phone] 11/09/2020, 12:25 PM  Clinical Narrative:     Patient from [facility], [name] [phone].        Expected Discharge Plan and Services                                                 Social Determinants of Health (SDOH) Interventions    Readmission Risk Interventions No flowsheet data found.

## 2020-11-09 NOTE — ED Triage Notes (Signed)
Pt here with back pain that started 4 weeks ago. PT states that she fell 2 years ago and then a year later she fell and hurt her left side. The pain today is on her right side.

## 2020-11-12 ENCOUNTER — Ambulatory Visit: Payer: Medicare Other | Admitting: Family Medicine

## 2020-11-12 DIAGNOSIS — S62307D Unspecified fracture of fifth metacarpal bone, left hand, subsequent encounter for fracture with routine healing: Secondary | ICD-10-CM | POA: Diagnosis not present

## 2020-11-12 DIAGNOSIS — R262 Difficulty in walking, not elsewhere classified: Secondary | ICD-10-CM | POA: Diagnosis not present

## 2020-11-12 DIAGNOSIS — M6281 Muscle weakness (generalized): Secondary | ICD-10-CM | POA: Diagnosis not present

## 2020-11-12 DIAGNOSIS — R2689 Other abnormalities of gait and mobility: Secondary | ICD-10-CM | POA: Diagnosis not present

## 2020-11-12 DIAGNOSIS — M25542 Pain in joints of left hand: Secondary | ICD-10-CM | POA: Diagnosis not present

## 2020-11-12 DIAGNOSIS — R41841 Cognitive communication deficit: Secondary | ICD-10-CM | POA: Diagnosis not present

## 2020-11-13 ENCOUNTER — Other Ambulatory Visit: Payer: Self-pay

## 2020-11-13 ENCOUNTER — Inpatient Hospital Stay
Admission: EM | Admit: 2020-11-13 | Discharge: 2020-11-17 | DRG: 478 | Disposition: A | Discharge status: Skilled Nursing Facility | Payer: Medicare Other | Attending: Internal Medicine | Admitting: Internal Medicine

## 2020-11-13 DIAGNOSIS — K529 Noninfective gastroenteritis and colitis, unspecified: Secondary | ICD-10-CM | POA: Diagnosis present

## 2020-11-13 DIAGNOSIS — K219 Gastro-esophageal reflux disease without esophagitis: Secondary | ICD-10-CM | POA: Diagnosis present

## 2020-11-13 DIAGNOSIS — Z888 Allergy status to other drugs, medicaments and biological substances status: Secondary | ICD-10-CM

## 2020-11-13 DIAGNOSIS — M549 Dorsalgia, unspecified: Secondary | ICD-10-CM | POA: Diagnosis present

## 2020-11-13 DIAGNOSIS — T148XXA Other injury of unspecified body region, initial encounter: Secondary | ICD-10-CM

## 2020-11-13 DIAGNOSIS — Z8249 Family history of ischemic heart disease and other diseases of the circulatory system: Secondary | ICD-10-CM

## 2020-11-13 DIAGNOSIS — I2510 Atherosclerotic heart disease of native coronary artery without angina pectoris: Secondary | ICD-10-CM | POA: Diagnosis present

## 2020-11-13 DIAGNOSIS — I482 Chronic atrial fibrillation, unspecified: Secondary | ICD-10-CM | POA: Diagnosis present

## 2020-11-13 DIAGNOSIS — M545 Low back pain, unspecified: Secondary | ICD-10-CM | POA: Diagnosis present

## 2020-11-13 DIAGNOSIS — S32020A Wedge compression fracture of second lumbar vertebra, initial encounter for closed fracture: Principal | ICD-10-CM | POA: Diagnosis present

## 2020-11-13 DIAGNOSIS — K589 Irritable bowel syndrome without diarrhea: Secondary | ICD-10-CM | POA: Diagnosis present

## 2020-11-13 DIAGNOSIS — S32000A Wedge compression fracture of unspecified lumbar vertebra, initial encounter for closed fracture: Secondary | ICD-10-CM | POA: Diagnosis not present

## 2020-11-13 DIAGNOSIS — M5441 Lumbago with sciatica, right side: Secondary | ICD-10-CM | POA: Diagnosis not present

## 2020-11-13 DIAGNOSIS — Z86010 Personal history of colonic polyps: Secondary | ICD-10-CM

## 2020-11-13 DIAGNOSIS — Z20822 Contact with and (suspected) exposure to covid-19: Secondary | ICD-10-CM | POA: Diagnosis not present

## 2020-11-13 DIAGNOSIS — M5117 Intervertebral disc disorders with radiculopathy, lumbosacral region: Secondary | ICD-10-CM | POA: Diagnosis present

## 2020-11-13 DIAGNOSIS — I495 Sick sinus syndrome: Secondary | ICD-10-CM | POA: Diagnosis present

## 2020-11-13 DIAGNOSIS — E876 Hypokalemia: Secondary | ICD-10-CM | POA: Diagnosis present

## 2020-11-13 DIAGNOSIS — Z66 Do not resuscitate: Secondary | ICD-10-CM | POA: Diagnosis not present

## 2020-11-13 DIAGNOSIS — E785 Hyperlipidemia, unspecified: Secondary | ICD-10-CM | POA: Diagnosis present

## 2020-11-13 DIAGNOSIS — Z87891 Personal history of nicotine dependence: Secondary | ICD-10-CM

## 2020-11-13 DIAGNOSIS — I10 Essential (primary) hypertension: Secondary | ICD-10-CM | POA: Diagnosis present

## 2020-11-13 DIAGNOSIS — Z79899 Other long term (current) drug therapy: Secondary | ICD-10-CM

## 2020-11-13 DIAGNOSIS — M2578 Osteophyte, vertebrae: Secondary | ICD-10-CM | POA: Diagnosis present

## 2020-11-13 DIAGNOSIS — Z950 Presence of cardiac pacemaker: Secondary | ICD-10-CM

## 2020-11-13 DIAGNOSIS — S32000S Wedge compression fracture of unspecified lumbar vertebra, sequela: Secondary | ICD-10-CM

## 2020-11-13 DIAGNOSIS — M62838 Other muscle spasm: Secondary | ICD-10-CM | POA: Diagnosis present

## 2020-11-13 DIAGNOSIS — S3210XA Unspecified fracture of sacrum, initial encounter for closed fracture: Secondary | ICD-10-CM | POA: Diagnosis not present

## 2020-11-13 DIAGNOSIS — Z87440 Personal history of urinary (tract) infections: Secondary | ICD-10-CM

## 2020-11-13 DIAGNOSIS — Z9049 Acquired absence of other specified parts of digestive tract: Secondary | ICD-10-CM

## 2020-11-13 DIAGNOSIS — M810 Age-related osteoporosis without current pathological fracture: Secondary | ICD-10-CM | POA: Diagnosis present

## 2020-11-13 DIAGNOSIS — Z8542 Personal history of malignant neoplasm of other parts of uterus: Secondary | ICD-10-CM

## 2020-11-13 DIAGNOSIS — N952 Postmenopausal atrophic vaginitis: Secondary | ICD-10-CM | POA: Diagnosis present

## 2020-11-13 DIAGNOSIS — Z9181 History of falling: Secondary | ICD-10-CM

## 2020-11-13 DIAGNOSIS — Z9861 Coronary angioplasty status: Secondary | ICD-10-CM

## 2020-11-13 DIAGNOSIS — M48061 Spinal stenosis, lumbar region without neurogenic claudication: Secondary | ICD-10-CM | POA: Diagnosis present

## 2020-11-13 DIAGNOSIS — E1140 Type 2 diabetes mellitus with diabetic neuropathy, unspecified: Secondary | ICD-10-CM | POA: Diagnosis present

## 2020-11-13 DIAGNOSIS — Z90710 Acquired absence of both cervix and uterus: Secondary | ICD-10-CM

## 2020-11-13 DIAGNOSIS — L89152 Pressure ulcer of sacral region, stage 2: Secondary | ICD-10-CM | POA: Diagnosis present

## 2020-11-13 DIAGNOSIS — I700 Atherosclerosis of aorta: Secondary | ICD-10-CM | POA: Diagnosis present

## 2020-11-13 DIAGNOSIS — L8990 Pressure ulcer of unspecified site, unspecified stage: Secondary | ICD-10-CM | POA: Insufficient documentation

## 2020-11-13 DIAGNOSIS — H409 Unspecified glaucoma: Secondary | ICD-10-CM | POA: Diagnosis present

## 2020-11-13 DIAGNOSIS — M4726 Other spondylosis with radiculopathy, lumbar region: Secondary | ICD-10-CM | POA: Diagnosis present

## 2020-11-13 DIAGNOSIS — X58XXXA Exposure to other specified factors, initial encounter: Secondary | ICD-10-CM | POA: Diagnosis present

## 2020-11-13 DIAGNOSIS — M4848XA Fatigue fracture of vertebra, sacral and sacrococcygeal region, initial encounter for fracture: Secondary | ICD-10-CM | POA: Diagnosis not present

## 2020-11-13 DIAGNOSIS — Z85828 Personal history of other malignant neoplasm of skin: Secondary | ICD-10-CM

## 2020-11-13 LAB — BASIC METABOLIC PANEL
Anion gap: 8 (ref 5–15)
BUN: 21 mg/dL (ref 8–23)
CO2: 35 mmol/L — ABNORMAL HIGH (ref 22–32)
Calcium: 9.2 mg/dL (ref 8.9–10.3)
Chloride: 95 mmol/L — ABNORMAL LOW (ref 98–111)
Creatinine, Ser: 0.73 mg/dL (ref 0.44–1.00)
GFR, Estimated: >60 mL/min (ref >60)
Glucose, Bld: 236 mg/dL — ABNORMAL HIGH (ref 70–99)
Potassium: 3.3 mmol/L — ABNORMAL LOW (ref 3.5–5.1)
Sodium: 138 mmol/L (ref 135–145)

## 2020-11-13 LAB — CBC WITH DIFFERENTIAL/PLATELET
Abs Immature Granulocytes: 0.02 10*3/uL (ref 0.00–0.07)
Basophils Absolute: 0 10*3/uL (ref 0.0–0.1)
Basophils Relative: 0 %
Eosinophils Absolute: 0 10*3/uL (ref 0.0–0.5)
Eosinophils Relative: 0 %
HCT: 44.1 % (ref 36.0–46.0)
Hemoglobin: 14.9 g/dL (ref 12.0–15.0)
Immature Granulocytes: 0 %
Lymphocytes Relative: 28 %
Lymphs Abs: 2.7 10*3/uL (ref 0.7–4.0)
MCH: 31.4 pg (ref 26.0–34.0)
MCHC: 33.8 g/dL (ref 30.0–36.0)
MCV: 92.8 fL (ref 80.0–100.0)
Monocytes Absolute: 0.7 10*3/uL (ref 0.1–1.0)
Monocytes Relative: 7 %
Neutro Abs: 6.2 10*3/uL (ref 1.7–7.7)
Neutrophils Relative %: 65 %
Platelets: 177 10*3/uL (ref 150–400)
RBC: 4.75 MIL/uL (ref 3.87–5.11)
RDW: 12.7 % (ref 11.5–15.5)
WBC: 9.6 10*3/uL (ref 4.0–10.5)
nRBC: 0 % (ref 0.0–0.2)

## 2020-11-13 LAB — MAGNESIUM: Magnesium: 1.8 mg/dL (ref 1.7–2.4)

## 2020-11-13 LAB — RESP PANEL BY RT-PCR (FLU A&B, COVID) ARPGX2
Influenza A by PCR: NEGATIVE
Influenza B by PCR: NEGATIVE
SARS Coronavirus 2 by RT PCR: NEGATIVE

## 2020-11-13 MED ORDER — METOPROLOL SUCCINATE ER 25 MG PO TB24
25.0000 mg | ORAL_TABLET | Freq: Two times a day (BID) | ORAL | Status: DC
  Administered 2020-11-13 – 2020-11-17 (×8): 25 mg via ORAL
  Filled 2020-11-13 (×8): qty 1

## 2020-11-13 MED ORDER — LIDOCAINE 5 % EX PTCH
1.0000 | MEDICATED_PATCH | CUTANEOUS | Status: DC
  Administered 2020-11-13 – 2020-11-16 (×3): 1 via TRANSDERMAL
  Filled 2020-11-13 (×5): qty 1

## 2020-11-13 MED ORDER — POTASSIUM CHLORIDE CRYS ER 20 MEQ PO TBCR
40.0000 meq | EXTENDED_RELEASE_TABLET | Freq: Once | ORAL | Status: AC
  Administered 2020-11-14: 40 meq via ORAL
  Filled 2020-11-13 (×2): qty 2

## 2020-11-13 MED ORDER — OXYCODONE-ACETAMINOPHEN 5-325 MG PO TABS
1.0000 | ORAL_TABLET | Freq: Once | ORAL | Status: AC
  Administered 2020-11-13: 1 via ORAL
  Filled 2020-11-13: qty 1

## 2020-11-13 MED ORDER — ACETAMINOPHEN 500 MG PO TABS: 500.0000 mg | ORAL_TABLET | Freq: Four times a day (QID) | ORAL | Status: DC | PRN

## 2020-11-13 MED ORDER — ACETAMINOPHEN 325 MG PO TABS: 325.0000 mg | ORAL_TABLET | Freq: Four times a day (QID) | ORAL | Status: DC | PRN

## 2020-11-13 MED ORDER — MELOXICAM 7.5 MG PO TABS
7.5000 mg | ORAL_TABLET | Freq: Every day | ORAL | Status: DC
  Administered 2020-11-13 – 2020-11-17 (×4): 7.5 mg via ORAL
  Filled 2020-11-13 (×5): qty 1

## 2020-11-13 MED ORDER — DIPHENOXYLATE-ATROPINE 2.5-0.025 MG PO TABS: 1.0000 | ORAL_TABLET | Freq: Four times a day (QID) | ORAL | Status: DC | PRN

## 2020-11-13 MED ORDER — ENOXAPARIN SODIUM 40 MG/0.4ML IJ SOSY
40.0000 mg | PREFILLED_SYRINGE | INTRAMUSCULAR | Status: DC
  Administered 2020-11-13 – 2020-11-16 (×4): 40 mg via SUBCUTANEOUS
  Filled 2020-11-13 (×3): qty 0.4

## 2020-11-13 MED ORDER — ONDANSETRON HCL 4 MG PO TABS
4.0000 mg | ORAL_TABLET | Freq: Four times a day (QID) | ORAL | Status: DC | PRN
  Administered 2020-11-15: 4 mg via ORAL
  Filled 2020-11-13: qty 1

## 2020-11-13 MED ORDER — OXYCODONE HCL 5 MG PO TABS
2.5000 mg | ORAL_TABLET | Freq: Three times a day (TID) | ORAL | Status: DC | PRN
  Administered 2020-11-14 – 2020-11-17 (×4): 2.5 mg via ORAL
  Filled 2020-11-13 (×4): qty 1

## 2020-11-13 MED ORDER — ONDANSETRON HCL 4 MG/2ML IJ SOLN: 4.0000 mg | Freq: Four times a day (QID) | INTRAMUSCULAR | Status: DC | PRN

## 2020-11-13 MED ORDER — ESTRADIOL 0.1 MG/GM VA CREA
1.0000 | TOPICAL_CREAM | VAGINAL | Status: DC
  Filled 2020-11-13: qty 42.5

## 2020-11-13 NOTE — Consult Note (Signed)
Patient is 87 year old is having intense back pain.  She had a CT several days ago showing L2 and sacral fracture.  She is having debilitating pain and having trouble caring for herself.  Is consulted to see her for this. On exam she is tender at L2 and to percussion at the sacrum right more than left and appears neurologically intact.  She does have some severe stenosis on her radiographs as well at the low lumbar level. My impression is that she has a L2 and sacral fractures are giving her a great deal of pain but I am concerned that there might be another level involved and recommend SPECT scan.  However I have been able to find a way to order that in the epic system and who just ordered a bone scan and hope we can get that done tomorrow and possibly treat her Thursday or Friday of this week.

## 2020-11-13 NOTE — ED Provider Notes (Signed)
[facility] Emergency Department Provider Note   ____________________________________________   Event Date/Time   First MD Initiated Contact with Patient 11/13/20 1342     (approximate)  I have reviewed the triage vital signs and the nursing notes.   HISTORY  Chief Complaint Back Pain    HPI Kara [name] is a 87 y.o. female patient presents with continued low back pain.  Patient was diagnosed 3 days ago with compression fracture but elected to go home instead of admission.  Patient states she lives alone and unable to control pain using Lidoderm patches.  Patient states she took oxycodone at 8:00 this morning but the medicine has worn off.  Patient is requesting admission for pain control.  Rates pain as a 10/10.  Described pain as "achy/stabbing".         Past Medical History:  Diagnosis Date   Allergy    Atrial fibrillation ([place])    CAD (coronary artery disease)    Cancer (HCC)    UTERINE   Chronic anticoagulation    Chronic diarrhea    Decubitus ulcer    sacral region   Diabetes ([place])    diet controlled   Dysuria    Edema of lower extremity    mainly right foot, slightly in left foot.   Facial fracture due to fall ([place]) 07/13/2020   Femur fracture, left ([place]) 06/09/2019   Fibrocystic breast disease    GERD (gastroesophageal reflux disease)    Glaucoma    Glaucoma    Hematuria    Hemorrhoids    History of colon polyps    History of pancreatitis    Hyperlipidemia    Hypertension    Hypokalemia    IBS (irritable bowel syndrome)    Microscopic hematuria    Mitral valve regurgitation    Osteoarthritis    fingers   Pernicious anemia    Plantar fasciitis    Recurrent UTI    Skin cancer    Sleep apnea, obstructive    Vaginal atrophy    Vitamin D deficiency    Yeast vaginitis     Patient Active Problem List   Diagnosis Date Noted   Closed compression fracture of L2 lumbar vertebra, initial encounter ([place]) 11/08/2020   Lumbar  spinal stenosis 11/08/2020   Left buttock pain 07/30/2020   Left hand pain 07/30/2020   History of recent fall 07/30/2020   Hospital discharge follow-up 07/22/2020   Intraparenchymal hemorrhage of brain ([place]) 07/13/2020   Syncope and collapse 07/13/2020   Orbital fracture ([place]) 07/13/2020   Atrial fibrillation, chronic ([place]) 07/13/2020   HTN (hypertension) 07/13/2020   DNR (do not resuscitate) 07/13/2020   S/P placement of cardiac pacemaker 07/03/2020   Myalgia due to statin 07/03/2020   Aortic atherosclerosis ([place]) 07/03/2020   Displaced intertrochanteric fracture of left femur, sequela 06/25/2019   Age-related osteoporosis with current pathological fracture with routine healing 06/15/2019   Sciatica of right side 02/18/2019   Vaginitis and vulvovaginitis 12/26/2018   Educated about COVID-19 virus infection 10/12/2018   Edema of lower extremity 11/24/2017   Exertional dyspnea 06/16/2017   Venous insufficiency of both lower extremities 11/24/2016   Essential hypertension 11/11/2016   Peripheral artery disease ([place]) 11/11/2016   Chronic pulmonary hypertension ([place]) 10/20/2016   Leg swelling 06/17/2016   Type 2 diabetes mellitus with diabetic neuropathy, unspecified ([place]) 06/17/2016   Urinary incontinence, urge 12/27/2015   Atrophic vaginitis 09/26/2015   Severe tricuspid valve insufficiency 85/06/7739   Umbilical hernia  without obstruction and without gangrene 06/26/2015   Carpal tunnel syndrome of right wrist 05/29/2015   Chronic fatigue 05/01/2015   E. coli UTI 11/21/2014   Vaginal atrophy 11/21/2014   Urethral caruncle 11/21/2014   Mitral valve regurgitation 05/09/2014   Fatigue 03/09/2014   SSS (sick sinus syndrome) ([place]) 10/11/2013   Encounter for current long-term use of anticoagulants 10/06/2012   Osteoarthritis 03/08/2012   Screening for breast cancer 03/08/2012   Pernicious anemia 02/04/2012   History of colectomy 02/04/2012   Vitamin D deficiency 07/30/2011    Acquired thrombophilia ([place]) 06/13/2011   Coronary atherosclerosis due to calcified coronary lesion 06/13/2011   Chronic diarrhea 06/13/2011    Past Surgical History:  Procedure Laterality Date   ABDOMINAL HYSTERECTOMY  1980's   ABDOMINAL SURGERY     for villous polyp,,,many years ago   APPENDECTOMY  1940's   ASCAD, s/p PTCA  11/28/2005   MID LESION    BREAST BIOPSY Left 1970's   CARPAL TUNNEL RELEASE Right 12/13/2015   Procedure: CARPAL TUNNEL RELEASE;  Surgeon: Thornton Park, MD;  Location: [facility] ORS;  Service: Orthopedics;  Laterality: Right;   COLECTOMY  2015   INTRAMEDULLARY (IM) NAIL INTERTROCHANTERIC Left 06/11/2019   Procedure: INTRAMEDULLARY (IM) NAIL INTERTROCHANTRIC;  Surgeon: Earnestine Leys, MD;  Location: [facility] ORS;  Service: Orthopedics;  Laterality: Left;   PACEMAKER PLACEMENT     radation     for uterine cance   REFRACTIVE SURGERY     for bilateral glaumoma   TONSILLECTOMY  1936    Prior to Admission medications   Medication Sig Start Date End Date Taking? Authorizing Provider  acetaminophen (TYLENOL) 500 MG tablet Take 500 mg by mouth every 6 (six) hours as needed.    [provider]  cefdinir (OMNICEF) 300 MG capsule Take 1 capsule (300 mg total) by mouth 2 (two) times daily. 10/21/20   Crecencio Mc, MD  cholecalciferol (VITAMIN D) 1000 UNITS tablet Take 1,000 Units by mouth daily.    [provider]  clobetasol cream (TEMOVATE) 0.05 % Apply topically. 12/24/18   [provider]  cyanocobalamin (,VITAMIN B-12,) 1000 MCG/ML injection INJECT 1ML INTO THE MUSCLE EVERY 14 DAYS 04/10/20   Crecencio Mc, MD  diphenoxylate-atropine (LOMOTIL) 2.5-0.025 MG tablet Take 1 tablet by mouth 4 (four) times daily as needed for diarrhea or loose stools.    [provider]  docusate sodium (COLACE) 100 MG capsule Take 1 capsule (100 mg total) by mouth daily for 10 days. While taking the oxycodone 11/09/20 11/19/20  Duffy Bruce, MD  estradiol  (ESTRACE) 0.1 MG/GM vaginal cream Place vaginally. 12/13/14   [provider]  [facility]) OINT Apply 1 application topically as needed. 07/20/20   Crecencio Mc, MD  meloxicam (MOBIC) 7.5 MG tablet Take 1 tablet (7.5 mg total) by mouth daily. 07/30/20   Crecencio Mc, MD  metoprolol succinate (TOPROL-XL) 25 MG 24 hr tablet Take 25 mg by mouth in the morning and at bedtime.    [provider]  metoprolol succinate (TOPROL-XL) 25 MG 24 hr tablet TAKE 1 TABLET BY MOUTH IN THE MORNING AND AT BEDTIME 08/24/20   Crecencio Mc, MD  oxyCODONE (ROXICODONE) 5 MG immediate release tablet Take 0.5 tablets (2.5 mg total) by mouth every 8 (eight) hours as needed for severe pain or breakthrough pain. 11/09/20 11/09/21  Duffy Bruce, MD  triamcinolone (KENALOG) 0.1 % APPLY 1 APPLICATION TOPICALLY 2 TIMES DAILY TO VULVA UNTIL ITCHING RESOLVES  THEN REDUCE USE TO TWICE WEEKLY. 05/31/20   Crecencio Mc, MD    Allergies Baycol [cerivastatin sodium], Cardizem [diltiazem hcl], Crestor [rosuvastatin calcium], Dronedarone, Metoprolol tartrate, Omeprazole, Pravachol, Statins, Vioxx [rofecoxib], and Zocor [simvastatin]  Family History  Problem Relation Age of Onset   Coronary artery disease Father    Kidney disease Father    Cancer Sister    Diabetes Other    Cancer Mother        COLON   Bladder Cancer Neg Hx    Breast cancer Neg Hx     Social History Social History   Tobacco Use   Smoking status: Former    Pack years: 0.00   Smokeless tobacco: Never   Tobacco comments:    quit 45 years ago  Vaping Use   Vaping Use: Never used  Substance Use Topics   Alcohol use: Yes    Alcohol/week: 0.0 standard drinks    Comment: Rarely, 1-2 times a year   Drug use: No    Review of Systems Constitutional: No fever/chills Eyes: No visual changes. ENT: No sore throat. Cardiovascular: Denies chest pain. Respiratory: Denies shortness of breath. Gastrointestinal: No  abdominal pain.  No nausea, no vomiting.  No diarrhea.  No constipation. Genitourinary: Negative for dysuria. Musculoskeletal: Positive for back pain. Skin: Negative for rash. Neurological: Negative for headaches, focal weakness or numbness. Endocrine: Diabetes, hyperlipidemia, hypertension. Hematological/Lymphatic:  Allergic/Immunilogical: Baycol, Cardizem, Metoprolol, Omeprazole, and statins.  ____________________________________________   PHYSICAL EXAM:  VITAL SIGNS: ED Triage Vitals  Enc Vitals Group     BP 11/13/20 1258 (!) 144/73     Pulse Rate 11/13/20 1258 (!) 108     Resp 11/13/20 1258 18     Temp 11/13/20 1258 98.5 F (36.9 C)     Temp Source 11/13/20 1258 Oral     SpO2 11/13/20 1258 95 %     Weight --      Height --      Head Circumference --      Peak Flow --      Pain Score 11/13/20 1256 10     Pain Loc --      Pain Edu? --      Excl. in [place]? --     Constitutional: Alert and oriented. Well appearing and in no acute distress.  Moderate distress. Eyes: Conjunctivae are normal. PERRL. EOMI. Head: Atraumatic. Nose: No congestion/rhinnorhea. Mouth/Throat: Mucous membranes are moist.  Oropharynx non-erythematous. Neck: No stridor.  No cervical spine tenderness to palpation. Hematological/Lymphatic/Immunilogical: No cervical lymphadenopathy. Cardiovascular: Normal rate, regular rhythm. Grossly normal heart sounds.  Good peripheral circulation. Respiratory: Normal respiratory effort.  No retractions. Lungs CTAB. Gastrointestinal: Soft and nontender. No distention. No abdominal bruits. No CVA tenderness. Genitourinary: Deferred Musculoskeletal: No lower extremity tenderness nor edema.  No joint effusions. Neurologic:  Normal speech and language. No gross focal neurologic deficits are appreciated. No gait instability. Skin:  Skin is warm, dry and intact. No rash noted. Psychiatric: Mood and affect are normal. Speech and behavior are  normal.  ____________________________________________   LABS (all labs ordered are listed, but only abnormal results are displayed)  Labs Reviewed  BASIC METABOLIC PANEL - Abnormal; Notable for the following components:      Result Value   Potassium 3.3 (*)    Chloride 95 (*)    CO2 35 (*)    Glucose, Bld 236 (*)    All other components within normal limits  RESP PANEL BY RT-PCR (FLU A&B, COVID) ARPGX2  CBC WITH DIFFERENTIAL/PLATELET   ____________________________________________  EKG   ____________________________________________  RADIOLOGY I, Sable Feil, personally viewed and evaluated these images (plain radiographs) as part of my medical decision making, as well as reviewing the written report by the radiologist.  ED MD interpretation:    Official radiology report(s): No results found.  ____________________________________________   PROCEDURES  Procedure(s) performed (including Critical Care):  Procedures   ____________________________________________   INITIAL IMPRESSION / ASSESSMENT AND PLAN / ED COURSE  As part of my medical decision making, I reviewed the following data within the [place] impression is pain control for compression fracture.  Patient is given Percocet, Lidoderm patch, and Tylenol with only mild relief in pain.  Will consult with hospitalist for admission for pain control.  Pending admission care for patient is turned over to Dr. Jari Pigg      ____________________________________________   FINAL CLINICAL IMPRESSION(S) / ED DIAGNOSES  Final diagnoses:  Lumbar compression fracture, closed, initial encounter [facility])     ED Discharge Orders     None        Note:  This document was prepared using Dragon voice recognition software and may include unintentional dictation errors.    Sable Feil, PA-C 11/13/20 1549    Naaman Plummer, MD 11/14/20 [phone]

## 2020-11-13 NOTE — H&P (Signed)
History and Physical    Kara [name]:[id number] DOB: 1922/07/29 DOA: 11/13/2020  PCP: Crecencio Mc, MD   Patient coming from:   Home I have personally briefly reviewed patient's old medical records in [place]  Chief Complaint: Back pain  HPI: Kara [name] is a 87 y.o. female with medical history significant for atrial fibrillation, coronary artery disease, diet-controlled diabetes mellitus who presents to the emergency room for evaluation of back pain which she has had intermittently for the last 4 weeks but has progressively worsened.  She rates her pain a 10 x 10 in intensity at its worst and states that it radiates down her right thigh.  She denies any recent falls or trauma.  She denies having any loss of bowel or bladder function and denies having any weakness or numbness of any of her extremities. She had a lumbar spine CT that was done as an outpatient which showed a lumbar compression fracture and she was seen in the emergency room on June 24 for evaluation of worsening pain but opted to be discharged home with pain control. Since her discharge home she has had increasing difficulty carrying out her activities of daily living. She has been taking her pain medications as prescribed without any significant improvement in her pain. She denies having any chest pain, no shortness of breath, no nausea, no vomiting, no changes in her bowel habits, no dizziness, no lightheadedness, no palpitations, no diaphoresis, no headache, no fever, no chills, no urinary symptoms. Labs show sodium 138, potassium 3.3, chloride 95, bicarb 35, glucose 236, BUN 21, creatinine 0.73, calcium 9.2, white count 9.6, hemoglobin 14.9, hematocrit 44.1, platelet 2.8, RDW 12.7, platelet count 177 Respiratory viral panel is negative Lumbar spine CT shows superior endplate compression fracture of L2 with approximately 25% height loss and minimal, 2-3 mm retropulsion of the superior endplate. Left-sided,  nondisplaced zone 1 sacral insufficiency fracture. Multilevel degenerative changes of the lumbar spine resulting in up to moderate left neural foraminal narrowing at L3-L4 and moderate-severe spinal canal narrowing at L4-L5. Additional degenerative changes as described above. Aortic Atherosclerosis      ED Course: Patient is a 87 year old female who presents to the ER for evaluation of a 4-week history of low back pain which has increased in severity over the last couple of days.  Pain radiates down her right thigh.  She has been on opioids for pain control with no relief in her pain. She will be referred to observation status for further evaluation.       Review of Systems: As per HPI otherwise all other systems reviewed and negative.    Past Medical History:  Diagnosis Date   Allergy    Atrial fibrillation ([place])    CAD (coronary artery disease)    Cancer (HCC)    UTERINE   Chronic anticoagulation    Chronic diarrhea    Decubitus ulcer    sacral region   Diabetes ([place])    diet controlled   Dysuria    Edema of lower extremity    mainly right foot, slightly in left foot.   Facial fracture due to fall [facility]) 07/13/2020   Femur fracture, left ([place]) 06/09/2019   Fibrocystic breast disease    GERD (gastroesophageal reflux disease)    Glaucoma    Glaucoma    Hematuria    Hemorrhoids    History of colon polyps    History of pancreatitis    Hyperlipidemia    Hypertension  Hypokalemia    IBS (irritable bowel syndrome)    Microscopic hematuria    Mitral valve regurgitation    Osteoarthritis    fingers   Pernicious anemia    Plantar fasciitis    Recurrent UTI    Skin cancer    Sleep apnea, obstructive    Vaginal atrophy    Vitamin D deficiency    Yeast vaginitis     Past Surgical History:  Procedure Laterality Date   ABDOMINAL HYSTERECTOMY  1980's   ABDOMINAL SURGERY     for villous polyp,,,many years ago   APPENDECTOMY  1940's   ASCAD, s/p PTCA  11/28/2005    MID LESION    BREAST BIOPSY Left 1970's   CARPAL TUNNEL RELEASE Right 12/13/2015   Procedure: CARPAL TUNNEL RELEASE;  Surgeon: Thornton Park, MD;  Location: [facility] ORS;  Service: Orthopedics;  Laterality: Right;   COLECTOMY  2015   INTRAMEDULLARY (IM) NAIL INTERTROCHANTERIC Left 06/11/2019   Procedure: INTRAMEDULLARY (IM) NAIL INTERTROCHANTRIC;  Surgeon: Earnestine Leys, MD;  Location: [facility] ORS;  Service: Orthopedics;  Laterality: Left;   PACEMAKER PLACEMENT     radation     for uterine cance   REFRACTIVE SURGERY     for bilateral glaumoma   TONSILLECTOMY  1936     reports that she has quit smoking. She has never used smokeless tobacco. She reports current alcohol use. She reports that she does not use drugs.  Allergies  Allergen Reactions   Baycol [Cerivastatin Sodium]    Cardizem [Diltiazem Hcl]     LOWER EXTREMITY EDEMA   Crestor [Rosuvastatin Calcium] Other (See Comments)    INTOLERANT   Dronedarone Other (See Comments)    Fatigue   Metoprolol Tartrate Other (See Comments)   Omeprazole Other (See Comments)   Pravachol     INTOLERANT   Statins Other (See Comments)   Vioxx [Rofecoxib]    Zocor [Simvastatin]     INTOLERANT    Family History  Problem Relation Age of Onset   Coronary artery disease Father    Kidney disease Father    Cancer Sister    Diabetes Other    Cancer Mother        COLON   Bladder Cancer Neg Hx    Breast cancer Neg Hx       Prior to Admission medications   Medication Sig Start Date End Date Taking? Authorizing Provider  acetaminophen (TYLENOL) 500 MG tablet Take 500 mg by mouth every 6 (six) hours as needed.    [provider]  cefdinir (OMNICEF) 300 MG capsule Take 1 capsule (300 mg total) by mouth 2 (two) times daily. 10/21/20   Crecencio Mc, MD  cholecalciferol (VITAMIN D) 1000 UNITS tablet Take 1,000 Units by mouth daily.    [provider]  clobetasol cream (TEMOVATE) 0.05 % Apply topically. 12/24/18   [provider]  cyanocobalamin (,VITAMIN B-12,) 1000 MCG/ML injection INJECT 1ML INTO THE MUSCLE EVERY 14 DAYS 04/10/20   Crecencio Mc, MD  diphenoxylate-atropine (LOMOTIL) 2.5-0.025 MG tablet Take 1 tablet by mouth 4 (four) times daily as needed for diarrhea or loose stools.    [provider]  docusate sodium (COLACE) 100 MG capsule Take 1 capsule (100 mg total) by mouth daily for 10 days. While taking the oxycodone 11/09/20 11/19/20  Duffy Bruce, MD  estradiol (ESTRACE) 0.1 MG/GM vaginal cream Place vaginally. 12/13/14   [provider]  [facility]) OINT Apply 1 application topically as needed.  07/20/20   Crecencio Mc, MD  meloxicam (MOBIC) 7.5 MG tablet Take 1 tablet (7.5 mg total) by mouth daily. 07/30/20   Crecencio Mc, MD  metoprolol succinate (TOPROL-XL) 25 MG 24 hr tablet TAKE 1 TABLET BY MOUTH IN THE MORNING AND AT BEDTIME 08/24/20   Crecencio Mc, MD  oxyCODONE (ROXICODONE) 5 MG immediate release tablet Take 0.5 tablets (2.5 mg total) by mouth every 8 (eight) hours as needed for severe pain or breakthrough pain. 11/09/20 11/09/21  Duffy Bruce, MD  triamcinolone (KENALOG) 0.1 % APPLY 1 APPLICATION TOPICALLY 2 TIMES DAILY TO VULVA UNTIL ITCHING RESOLVES THEN REDUCE USE TO TWICE WEEKLY. 05/31/20   Crecencio Mc, MD    Physical Exam: Vitals:   11/13/20 1258  BP: (!) 144/73  Pulse: (!) 108  Resp: 18  Temp: 98.5 F (36.9 C)  TempSrc: Oral  SpO2: 95%     Vitals:   11/13/20 1258  BP: (!) 144/73  Pulse: (!) 108  Resp: 18  Temp: 98.5 F (36.9 C)  TempSrc: Oral  SpO2: 95%      Constitutional: Alert and oriented x 3 . Not in any apparent distress HEENT:      Head: Normocephalic and atraumatic.         Eyes: PERLA, EOMI, Conjunctivae are normal. Sclera is non-icteric.       Mouth/Throat: Mucous membranes are moist.       Neck: Supple with no signs of meningismus. Cardiovascular: Regular rate and rhythm. No murmurs, gallops, or  rubs. 2+ symmetrical distal pulses are present . No JVD. RLE > LLE. (Patient states it is chronic) Respiratory: Respiratory effort normal .Lungs sounds clear bilaterally. No wheezes, crackles, or rhonchi.  Gastrointestinal: Soft, non tender, and non distended with positive bowel sounds.  Genitourinary: No CVA tenderness. Musculoskeletal: Nontender with normal range of motion in all extremities. No cyanosis, or erythema of extremities. Neurologic:  Face is symmetric. Moving all extremities. No gross focal neurologic deficits . Skin: Skin is warm, dry.  No rash or ulcers Psychiatric: Mood and affect are normal    Labs on Admission: I have personally reviewed following labs and imaging studies  CBC: Recent Labs  Lab 11/09/20 1400 11/13/20 1358  WBC 10.5 9.6  NEUTROABS 7.9* 6.2  HGB 15.1* 14.9  HCT 44.4 44.1  MCV 91.4 92.8  PLT 211 149   Basic Metabolic Panel: Recent Labs  Lab 11/09/20 1400 11/13/20 1358  NA 139 138  K 3.1* 3.3*  CL 96* 95*  CO2 33* 35*  GLUCOSE 158* 236*  BUN 28* 21  CREATININE 0.71 0.73  CALCIUM 9.4 9.2   GFR: Estimated Creatinine Clearance: 32.5 mL/min (by C-G formula based on SCr of 0.73 mg/dL). Liver Function Tests: No results for input(s): AST, ALT, ALKPHOS, BILITOT, PROT, ALBUMIN in the last 168 hours. No results for input(s): LIPASE, AMYLASE in the last 168 hours. No results for input(s): AMMONIA in the last 168 hours. Coagulation Profile: No results for input(s): INR, PROTIME in the last 168 hours. Cardiac Enzymes: No results for input(s): CKTOTAL, CKMB, CKMBINDEX, TROPONINI in the last 168 hours. BNP (last 3 results) No results for input(s): PROBNP in the last 8760 hours. HbA1C: No results for input(s): HGBA1C in the last 72 hours. CBG: No results for input(s): GLUCAP in the last 168 hours. Lipid Profile: No results for input(s): CHOL, HDL, LDLCALC, TRIG, CHOLHDL, LDLDIRECT in the last 72 hours. Thyroid Function Tests: No results for  input(s): TSH, T4TOTAL, FREET4, T3FREE,  THYROIDAB in the last 72 hours. Anemia Panel: No results for input(s): VITAMINB12, FOLATE, FERRITIN, TIBC, IRON, RETICCTPCT in the last 72 hours. Urine analysis:    Component Value Date/Time   COLORURINE STRAW (A) 11/09/2020 1425   APPEARANCEUR CLEAR (A) 11/09/2020 1425   APPEARANCEUR Clear 05/27/2019 1002   LABSPEC 1.005 11/09/2020 1425   LABSPEC 1.016 12/26/2011 1844   PHURINE 7.0 11/09/2020 1425   GLUCOSEU NEGATIVE 11/09/2020 1425   GLUCOSEU NEGATIVE 10/19/2020 1201   HGBUR SMALL (A) 11/09/2020 1425   BILIRUBINUR NEGATIVE 11/09/2020 [place] 10/19/2020 1207   BILIRUBINUR Negative 05/27/2019 1002   BILIRUBINUR Negative 12/26/2011 1844   KETONESUR NEGATIVE 11/09/2020 1425   PROTEINUR NEGATIVE 11/09/2020 1425   UROBILINOGEN 0.2 10/19/2020 1207   UROBILINOGEN 0.2 10/19/2020 1201   NITRITE NEGATIVE 11/09/2020 1425   LEUKOCYTESUR NEGATIVE 11/09/2020 1425   LEUKOCYTESUR 1+ 12/26/2011 1844    Radiological Exams on Admission: No results found.   Assessment/Plan Principal Problem:   Compression fx, lumbar spine, sequela Active Problems:   Chronic diarrhea   History of colectomy   Type 2 diabetes mellitus with diabetic neuropathy, unspecified (HCC)   Hypokalemia   Atrial fibrillation, chronic (HCC)   Lumbar spinal stenosis      Lumbar spine compression fracture Pain control with Mobic, oxycodone and Lidoderm patch Place patient on fall precautions Will request orthopedic surgery consult for evaluation for possible kyphoplasty    Hypokalemia Secondary to GI loss from diarrhea Patient is status post colectomy Supplement potassium Check magnesium levels    Chronic atrial fibrillation Rate control on metoprolol Not on chronic anticoagulation due to increased risk for falls     Diabetes mellitus with complications of diabetic neuropathy Maintain consistent carbohydrate diet Blood sugar checks 3 times  daily   DVT prophylaxis: Lovenox  Code Status: DNR  Family Communication: Greater than 50% of time was spent discussing plan of care with patient at the bedside.  All questions and concerns have been addressed.  She verbalizes understanding and agrees to the plan.  CODE STATUS was discussed with patient that she is a DNR. Disposition Plan: Back to previous home environment Consults called: Orthopedic surgery Status: Observation    Laporchia Nakajima MD [facility]     11/13/2020, 5:01 PM

## 2020-11-13 NOTE — ED Triage Notes (Signed)
Pt comes with c/o lower back pain. Pt denies any recent injuries or falls. Pt states the last several weeks it has gotten worse.

## 2020-11-13 NOTE — ED Notes (Signed)
Patient assisted on bedpan at this time.

## 2020-11-14 ENCOUNTER — Observation Stay: Payer: Medicare Other

## 2020-11-14 ENCOUNTER — Encounter: Payer: Self-pay | Admitting: Internal Medicine

## 2020-11-14 DIAGNOSIS — Z7901 Long term (current) use of anticoagulants: Secondary | ICD-10-CM | POA: Diagnosis not present

## 2020-11-14 DIAGNOSIS — E1140 Type 2 diabetes mellitus with diabetic neuropathy, unspecified: Secondary | ICD-10-CM | POA: Diagnosis present

## 2020-11-14 DIAGNOSIS — Z20822 Contact with and (suspected) exposure to covid-19: Secondary | ICD-10-CM | POA: Diagnosis present

## 2020-11-14 DIAGNOSIS — I4819 Other persistent atrial fibrillation: Secondary | ICD-10-CM | POA: Diagnosis not present

## 2020-11-14 DIAGNOSIS — Z7401 Bed confinement status: Secondary | ICD-10-CM | POA: Diagnosis not present

## 2020-11-14 DIAGNOSIS — H409 Unspecified glaucoma: Secondary | ICD-10-CM | POA: Diagnosis present

## 2020-11-14 DIAGNOSIS — E876 Hypokalemia: Secondary | ICD-10-CM | POA: Diagnosis present

## 2020-11-14 DIAGNOSIS — L292 Pruritus vulvae: Secondary | ICD-10-CM | POA: Diagnosis not present

## 2020-11-14 DIAGNOSIS — M48061 Spinal stenosis, lumbar region without neurogenic claudication: Secondary | ICD-10-CM | POA: Diagnosis present

## 2020-11-14 DIAGNOSIS — Z8249 Family history of ischemic heart disease and other diseases of the circulatory system: Secondary | ICD-10-CM | POA: Diagnosis not present

## 2020-11-14 DIAGNOSIS — I272 Pulmonary hypertension, unspecified: Secondary | ICD-10-CM | POA: Diagnosis not present

## 2020-11-14 DIAGNOSIS — L8990 Pressure ulcer of unspecified site, unspecified stage: Secondary | ICD-10-CM | POA: Insufficient documentation

## 2020-11-14 DIAGNOSIS — M84454A Pathological fracture, pelvis, initial encounter for fracture: Secondary | ICD-10-CM | POA: Diagnosis not present

## 2020-11-14 DIAGNOSIS — I2510 Atherosclerotic heart disease of native coronary artery without angina pectoris: Secondary | ICD-10-CM | POA: Diagnosis present

## 2020-11-14 DIAGNOSIS — M4848XA Fatigue fracture of vertebra, sacral and sacrococcygeal region, initial encounter for fracture: Secondary | ICD-10-CM | POA: Diagnosis not present

## 2020-11-14 DIAGNOSIS — I495 Sick sinus syndrome: Secondary | ICD-10-CM | POA: Diagnosis present

## 2020-11-14 DIAGNOSIS — M6281 Muscle weakness (generalized): Secondary | ICD-10-CM | POA: Diagnosis not present

## 2020-11-14 DIAGNOSIS — Z66 Do not resuscitate: Secondary | ICD-10-CM | POA: Diagnosis present

## 2020-11-14 DIAGNOSIS — M2578 Osteophyte, vertebrae: Secondary | ICD-10-CM | POA: Diagnosis present

## 2020-11-14 DIAGNOSIS — S32009D Unspecified fracture of unspecified lumbar vertebra, subsequent encounter for fracture with routine healing: Secondary | ICD-10-CM | POA: Diagnosis not present

## 2020-11-14 DIAGNOSIS — I739 Peripheral vascular disease, unspecified: Secondary | ICD-10-CM | POA: Diagnosis not present

## 2020-11-14 DIAGNOSIS — I071 Rheumatic tricuspid insufficiency: Secondary | ICD-10-CM | POA: Diagnosis not present

## 2020-11-14 DIAGNOSIS — S3210XD Unspecified fracture of sacrum, subsequent encounter for fracture with routine healing: Secondary | ICD-10-CM | POA: Diagnosis not present

## 2020-11-14 DIAGNOSIS — E782 Mixed hyperlipidemia: Secondary | ICD-10-CM | POA: Diagnosis not present

## 2020-11-14 DIAGNOSIS — K589 Irritable bowel syndrome without diarrhea: Secondary | ICD-10-CM | POA: Diagnosis present

## 2020-11-14 DIAGNOSIS — M545 Low back pain, unspecified: Secondary | ICD-10-CM | POA: Diagnosis present

## 2020-11-14 DIAGNOSIS — I340 Nonrheumatic mitral (valve) insufficiency: Secondary | ICD-10-CM | POA: Diagnosis not present

## 2020-11-14 DIAGNOSIS — E559 Vitamin D deficiency, unspecified: Secondary | ICD-10-CM | POA: Diagnosis not present

## 2020-11-14 DIAGNOSIS — X58XXXA Exposure to other specified factors, initial encounter: Secondary | ICD-10-CM | POA: Diagnosis present

## 2020-11-14 DIAGNOSIS — I10 Essential (primary) hypertension: Secondary | ICD-10-CM | POA: Diagnosis present

## 2020-11-14 DIAGNOSIS — Z87891 Personal history of nicotine dependence: Secondary | ICD-10-CM | POA: Diagnosis not present

## 2020-11-14 DIAGNOSIS — R41841 Cognitive communication deficit: Secondary | ICD-10-CM | POA: Diagnosis not present

## 2020-11-14 DIAGNOSIS — S32000D Wedge compression fracture of unspecified lumbar vertebra, subsequent encounter for fracture with routine healing: Secondary | ICD-10-CM | POA: Diagnosis not present

## 2020-11-14 DIAGNOSIS — M4726 Other spondylosis with radiculopathy, lumbar region: Secondary | ICD-10-CM | POA: Diagnosis present

## 2020-11-14 DIAGNOSIS — M2550 Pain in unspecified joint: Secondary | ICD-10-CM | POA: Diagnosis not present

## 2020-11-14 DIAGNOSIS — K219 Gastro-esophageal reflux disease without esophagitis: Secondary | ICD-10-CM | POA: Diagnosis present

## 2020-11-14 DIAGNOSIS — N952 Postmenopausal atrophic vaginitis: Secondary | ICD-10-CM | POA: Diagnosis present

## 2020-11-14 DIAGNOSIS — R948 Abnormal results of function studies of other organs and systems: Secondary | ICD-10-CM | POA: Diagnosis not present

## 2020-11-14 DIAGNOSIS — S32000S Wedge compression fracture of unspecified lumbar vertebra, sequela: Secondary | ICD-10-CM | POA: Diagnosis not present

## 2020-11-14 DIAGNOSIS — I700 Atherosclerosis of aorta: Secondary | ICD-10-CM | POA: Diagnosis present

## 2020-11-14 DIAGNOSIS — M5117 Intervertebral disc disorders with radiculopathy, lumbosacral region: Secondary | ICD-10-CM | POA: Diagnosis present

## 2020-11-14 DIAGNOSIS — Z8542 Personal history of malignant neoplasm of other parts of uterus: Secondary | ICD-10-CM | POA: Diagnosis not present

## 2020-11-14 DIAGNOSIS — S32020A Wedge compression fracture of second lumbar vertebra, initial encounter for closed fracture: Secondary | ICD-10-CM | POA: Diagnosis present

## 2020-11-14 DIAGNOSIS — Z950 Presence of cardiac pacemaker: Secondary | ICD-10-CM | POA: Diagnosis not present

## 2020-11-14 DIAGNOSIS — M62838 Other muscle spasm: Secondary | ICD-10-CM | POA: Diagnosis present

## 2020-11-14 DIAGNOSIS — Z9181 History of falling: Secondary | ICD-10-CM | POA: Diagnosis not present

## 2020-11-14 DIAGNOSIS — S3210XA Unspecified fracture of sacrum, initial encounter for closed fracture: Secondary | ICD-10-CM | POA: Diagnosis present

## 2020-11-14 DIAGNOSIS — M549 Dorsalgia, unspecified: Secondary | ICD-10-CM | POA: Diagnosis present

## 2020-11-14 DIAGNOSIS — S32021A Stable burst fracture of second lumbar vertebra, initial encounter for closed fracture: Secondary | ICD-10-CM | POA: Diagnosis not present

## 2020-11-14 DIAGNOSIS — L89152 Pressure ulcer of sacral region, stage 2: Secondary | ICD-10-CM | POA: Diagnosis present

## 2020-11-14 DIAGNOSIS — I959 Hypotension, unspecified: Secondary | ICD-10-CM | POA: Diagnosis not present

## 2020-11-14 DIAGNOSIS — M5441 Lumbago with sciatica, right side: Secondary | ICD-10-CM | POA: Diagnosis not present

## 2020-11-14 DIAGNOSIS — R2689 Other abnormalities of gait and mobility: Secondary | ICD-10-CM | POA: Diagnosis not present

## 2020-11-14 DIAGNOSIS — I482 Chronic atrial fibrillation, unspecified: Secondary | ICD-10-CM | POA: Diagnosis present

## 2020-11-14 DIAGNOSIS — E785 Hyperlipidemia, unspecified: Secondary | ICD-10-CM | POA: Diagnosis present

## 2020-11-14 DIAGNOSIS — M810 Age-related osteoporosis without current pathological fracture: Secondary | ICD-10-CM | POA: Diagnosis present

## 2020-11-14 LAB — BASIC METABOLIC PANEL
Anion gap: 10 (ref 5–15)
BUN: 19 mg/dL (ref 8–23)
CO2: 32 mmol/L (ref 22–32)
Calcium: 8.9 mg/dL (ref 8.9–10.3)
Chloride: 97 mmol/L — ABNORMAL LOW (ref 98–111)
Creatinine, Ser: 0.7 mg/dL (ref 0.44–1.00)
GFR, Estimated: >60 mL/min (ref >60)
Glucose, Bld: 169 mg/dL — ABNORMAL HIGH (ref 70–99)
Potassium: 2.8 mmol/L — ABNORMAL LOW (ref 3.5–5.1)
Sodium: 139 mmol/L (ref 135–145)

## 2020-11-14 LAB — GLUCOSE, CAPILLARY
Glucose-Capillary: 156 mg/dL — ABNORMAL HIGH (ref 70–99)
Glucose-Capillary: 169 mg/dL — ABNORMAL HIGH (ref 70–99)
Glucose-Capillary: 192 mg/dL — ABNORMAL HIGH (ref 70–99)

## 2020-11-14 LAB — CBC
HCT: 41.4 % (ref 36.0–46.0)
Hemoglobin: 13.7 g/dL (ref 12.0–15.0)
MCH: 30.8 pg (ref 26.0–34.0)
MCHC: 33.1 g/dL (ref 30.0–36.0)
MCV: 93 fL (ref 80.0–100.0)
Platelets: 176 10*3/uL (ref 150–400)
RBC: 4.45 MIL/uL (ref 3.87–5.11)
RDW: 12.8 % (ref 11.5–15.5)
WBC: 8.6 10*3/uL (ref 4.0–10.5)
nRBC: 0 % (ref 0.0–0.2)

## 2020-11-14 MED ORDER — POTASSIUM CHLORIDE CRYS ER 20 MEQ PO TBCR
40.0000 meq | EXTENDED_RELEASE_TABLET | ORAL | Status: AC
  Administered 2020-11-14 (×2): 40 meq via ORAL
  Filled 2020-11-14: qty 2

## 2020-11-14 MED ORDER — CEFAZOLIN SODIUM-DEXTROSE 1-4 GM/50ML-% IV SOLN
1.0000 g | INTRAVENOUS | Status: AC
  Administered 2020-11-15: 1 g via INTRAVENOUS
  Filled 2020-11-14: qty 50

## 2020-11-14 MED ORDER — METHOCARBAMOL 500 MG PO TABS
500.0000 mg | ORAL_TABLET | Freq: Three times a day (TID) | ORAL | Status: DC | PRN
  Administered 2020-11-15 – 2020-11-17 (×4): 500 mg via ORAL
  Filled 2020-11-14 (×4): qty 1

## 2020-11-14 MED ORDER — MORPHINE SULFATE (PF) 2 MG/ML IV SOLN
1.0000 mg | INTRAVENOUS | Status: DC | PRN
Start: 2020-11-14 — End: 2020-11-17
  Administered 2020-11-15 – 2020-11-16 (×2): 1 mg via INTRAVENOUS
  Filled 2020-11-14 (×2): qty 1

## 2020-11-14 MED ORDER — TECHNETIUM TC 99M MEDRONATE IV KIT
20.0000 | PACK | Freq: Once | INTRAVENOUS | Status: AC | PRN
  Administered 2020-11-14: 21.94 via INTRAVENOUS

## 2020-11-14 MED ORDER — INSULIN ASPART 100 UNIT/ML IJ SOLN
0.0000 [IU] | Freq: Three times a day (TID) | INTRAMUSCULAR | Status: DC
  Administered 2020-11-16: 1 [IU] via SUBCUTANEOUS
  Administered 2020-11-16: 2 [IU] via SUBCUTANEOUS
  Administered 2020-11-17: 1 [IU] via SUBCUTANEOUS
  Filled 2020-11-14: qty 1

## 2020-11-14 NOTE — Progress Notes (Signed)
Pts K 2.8. MD notified. MD placed orders, per pt she cant take PO K. MD notified. No new orders.

## 2020-11-14 NOTE — Hospital Course (Addendum)
Kara [name] is a 87 y.o. female with medical history significant for atrial fibrillation, coronary artery disease, diet-controlled diabetes mellitus who presents to the emergency room for evaluation of back pain which she has had intermittently for the last 4 weeks but has progressively worsened.  Reported also having radiation of pain down R leg, associated muscle spasms.  No loss of bowel or bladder function, and no weakness of numbness.  Due to severe debilitating pain despite outpatient pain medications, patient admitted to the hospital for further management and evaluation.  Orthopedic surgery consulted.  Imaging (see full reports): Lumbar spine CT shows superior endplate compression fracture of L2 with  approximately 25% height loss and minimal, 2-3 mm retropulsion of the superior endplate.  Left-sided, nondisplaced zone 1 sacral insufficiency fracture. Multilevel degenerative changes of the lumbar spine resulting in up to moderate left neural foraminal narrowing at L3-L4 and moderate-severe spinal canal narrowing at L4-L5.

## 2020-11-14 NOTE — Progress Notes (Signed)
PROGRESS NOTE    Kara [name]   ZYS:[id number]  DOB: 02-24-1923  PCP: Crecencio Mc, MD    DOA: 11/13/2020 LOS: 0   Assessment & Plan   Principal Problem:   Compression fx, lumbar spine, sequela Active Problems:   Chronic diarrhea   History of colectomy   Type 2 diabetes mellitus with diabetic neuropathy, unspecified (HCC)   Hypokalemia   Atrial fibrillation, chronic (HCC)   Lumbar spinal stenosis   Pressure injury of skin   Intractable back pain   Lumbar spine compression fracture Sacral insufficiency fracture --Orthopedics, Dr. Rudene Christians following --Pain control: Mobic, oxycodone, Lidoderm patch --add Robaxin PRN  muscle spasms --Fall precautions --possible kyphoplasty --bone scan for further evaluation ?any other pain source aside from above?     Hypokalemia - Secondary to GI loss from diarrhea which is chronic, patient is status post colectomy. Replace potassium as needed.  Monitor Mg as well.    Chronic atrial fibrillation - rate controlled --continue metoprolol --not on anticoagulation due to increased risk for falls      Diabetes mellitus with complications of diabetic neuropathy Maintain consistent carbohydrate diet Blood sugar checks 3 times daily   Postmenopausal atrophic vaginitis - estradiol vaginal cream 3 times/week   Patient BMI: There is no height or weight on file to calculate BMI.   DVT prophylaxis: enoxaparin (LOVENOX) injection 40 mg Start: 11/13/20 2200   Diet:  Diet Orders (From admission, onward)     Start     Ordered   11/15/20 0800  Diet NPO time specified  Diet effective ____        11/14/20 1759   11/13/20 1700  Diet heart healthy/carb modified Room service appropriate? Yes; Fluid consistency: Thin  Diet effective now       Question Answer Comment  Diet-HS Snack? Nothing   Room service appropriate? Yes   Fluid consistency: Thin      11/13/20 1700              Code Status: DNR   Brief Narrative / Hospital Course to  Date:   Kara [name] is a 87 y.o. female with medical history significant for atrial fibrillation, coronary artery disease, diet-controlled diabetes mellitus who presents to the emergency room for evaluation of back pain which she has had intermittently for the last 4 weeks but has progressively worsened.  Reported also having radiation of pain down R leg, associated muscle spasms.  No loss of bowel or bladder function, and no weakness of numbness.  Due to severe debilitating pain despite outpatient pain medications, patient admitted to the hospital for further management and evaluation.  Orthopedic surgery consulted.  Imaging (see full reports): Lumbar spine CT shows superior endplate compression fracture of L2 with  approximately 25% height loss and minimal, 2-3 mm retropulsion of the superior endplate.  Left-sided, nondisplaced zone 1 sacral insufficiency fracture. Multilevel degenerative changes of the lumbar spine resulting in up to moderate left neural foraminal narrowing at L3-L4 and moderate-severe spinal canal narrowing at L4-L5.     Subjective 11/14/20    Pt awake laying in bed.  Reports pain currently controlled for most part.  Having muscle spasms and continues to have a lot of radicular/neuropathic pain.     Disposition Plan & Communication   Status is: Inpatient  Remains inpatient appropriate because:Ongoing diagnostic testing needed not appropriate for outpatient work up, IV treatments appropriate due to intensity of illness or inability to take PO, and Inpatient level of care appropriate  due to severity of illness  Dispo: The patient is from: Home              Anticipated d/c is to: SNF              Patient currently is not medically stable to d/c.   Difficult to place patient No    Consults, Procedures, Significant Events   Consultants:  Orthopedic surgery, Dr. Rudene Christians  Procedures:  None  Antimicrobials:  Anti-infectives (From admission, onward)    Start      Dose/Rate Route Frequency Ordered Stop   11/15/20 1800  ceFAZolin (ANCEF) IVPB 1 g/50 mL premix        1 g 100 mL/hr over 30 Minutes Intravenous 60 min pre-op 11/14/20 1759           Micro    Objective   Vitals:   11/14/20 0822 11/14/20 1044 11/14/20 1156 11/14/20 1233  BP: (!) 153/72  (!) 156/71 (!) 158/76  Pulse: 73  74 83  Resp: (!) 21 18 17  (!) 21  Temp: 98.4 F (36.9 C)  98 F (36.7 C) 97.6 F (36.4 C)  TempSrc: Oral   Oral  SpO2: 94%  98% 98%    Intake/Output Summary (Last 24 hours) at 11/14/2020 2007 Last data filed at 11/14/2020 1351 Gross per 24 hour  Intake 480 ml  Output 600 ml  Net -120 ml   There were no vitals filed for this visit.  Physical Exam:  General exam: awake, alert, no acute distress HEENT: atraumatic, clear conjunctiva, anicteric sclera, moist mucus membranes, hearing grossly normal  Respiratory system: CTAB, no wheezes, rales or rhonchi, normal respiratory effort. Cardiovascular system: normal S1/S2, RRR, no pedal edema.   Gastrointestinal system: soft, NT, ND Central nervous system: A&O x3. no gross focal neurologic deficits, normal speech Extremities: no edema, normal tone Psychiatry: normal mood, congruent affect, judgement and insight appear normal  Labs   Data Reviewed: I have personally reviewed following labs and imaging studies  CBC: Recent Labs  Lab 11/09/20 1400 11/13/20 1358 11/14/20 0538  WBC 10.5 9.6 8.6  NEUTROABS 7.9* 6.2  --   HGB 15.1* 14.9 13.7  HCT 44.4 44.1 41.4  MCV 91.4 92.8 93.0  PLT 211 177 161   Basic Metabolic Panel: Recent Labs  Lab 11/09/20 1400 11/13/20 1358 11/14/20 0538  NA 139 138 139  K 3.1* 3.3* 2.8*  CL 96* 95* 97*  CO2 33* 35* 32  GLUCOSE 158* 236* 169*  BUN 28* 21 19  CREATININE 0.71 0.73 0.70  CALCIUM 9.4 9.2 8.9  MG  --  1.8  --    GFR: Estimated Creatinine Clearance: 32.5 mL/min (by C-G formula based on SCr of 0.7 mg/dL). Liver Function Tests: No results for input(s): AST,  ALT, ALKPHOS, BILITOT, PROT, ALBUMIN in the last 168 hours. No results for input(s): LIPASE, AMYLASE in the last 168 hours. No results for input(s): AMMONIA in the last 168 hours. Coagulation Profile: No results for input(s): INR, PROTIME in the last 168 hours. Cardiac Enzymes: No results for input(s): CKTOTAL, CKMB, CKMBINDEX, TROPONINI in the last 168 hours. BNP (last 3 results) No results for input(s): PROBNP in the last 8760 hours. HbA1C: No results for input(s): HGBA1C in the last 72 hours. CBG: Recent Labs  Lab 11/14/20 1204 11/14/20 1641  GLUCAP 156* 169*   Lipid Profile: No results for input(s): CHOL, HDL, LDLCALC, TRIG, CHOLHDL, LDLDIRECT in the last 72 hours. Thyroid Function Tests: No results for input(s):  TSH, T4TOTAL, FREET4, T3FREE, THYROIDAB in the last 72 hours. Anemia Panel: No results for input(s): VITAMINB12, FOLATE, FERRITIN, TIBC, IRON, RETICCTPCT in the last 72 hours. Sepsis Labs: No results for input(s): PROCALCITON, LATICACIDVEN in the last 168 hours.  Recent Results (from the past 240 hour(s))  Resp Panel by RT-PCR (Flu A&B, Covid) Nasopharyngeal Swab     Status: None   Collection Time: 11/13/20  2:00 PM   Specimen: Nasopharyngeal Swab; Nasopharyngeal(NP) swabs in vial transport medium  Result Value Ref Range Status   SARS Coronavirus 2 by RT PCR NEGATIVE NEGATIVE Final    Comment: (NOTE) SARS-CoV-2 target nucleic acids are NOT DETECTED.  The SARS-CoV-2 RNA is generally detectable in upper respiratory specimens during the acute phase of infection. The lowest concentration of SARS-CoV-2 viral copies this assay can detect is 138 copies/mL. A negative result does not preclude SARS-Cov-2 infection and should not be used as the sole basis for treatment or other patient management decisions. A negative result may occur with  improper specimen collection/handling, submission of specimen other than nasopharyngeal swab, presence of viral mutation(s) within  the areas targeted by this assay, and inadequate number of viral copies(<138 copies/mL). A negative result must be combined with clinical observations, patient history, and epidemiological information. The expected result is Negative.  Fact Sheet for Patients:  EntrepreneurPulse.com.au  Fact Sheet for Healthcare Providers:  IncredibleEmployment.be  This test is no t yet approved or cleared by the [place] FDA and  has been authorized for detection and/or diagnosis of SARS-CoV-2 by FDA under an Emergency Use Authorization (EUA). This EUA will remain  in effect (meaning this test can be used) for the duration of the COVID-19 declaration under Section 564(b)(1) of the Act, 21 U.S.C.section 360bbb-3(b)(1), unless the authorization is terminated  or revoked sooner.       Influenza A by PCR NEGATIVE NEGATIVE Final   Influenza B by PCR NEGATIVE NEGATIVE Final    Comment: (NOTE) The Xpert Xpress SARS-CoV-2/FLU/RSV plus assay is intended as an aid in the diagnosis of influenza from Nasopharyngeal swab specimens and should not be used as a sole basis for treatment. Nasal washings and aspirates are unacceptable for Xpert Xpress SARS-CoV-2/FLU/RSV testing.  Fact Sheet for Patients: EntrepreneurPulse.com.au  Fact Sheet for Healthcare Providers: IncredibleEmployment.be  This test is not yet approved or cleared by the [place] FDA and has been authorized for detection and/or diagnosis of SARS-CoV-2 by FDA under an Emergency Use Authorization (EUA). This EUA will remain in effect (meaning this test can be used) for the duration of the COVID-19 declaration under Section 564(b)(1) of the Act, 21 U.S.C. section 360bbb-3(b)(1), unless the authorization is terminated or revoked.  Performed at [facility], [address] [place], [place] [address]       Imaging Studies   NM Bone  W/Spect  Result Date: 11/14/2020 CLINICAL DATA:  Severe back pain EXAM: NM BONE SCAN AND SPECT IMAGING TECHNIQUE: After intravenous injection of radiopharmaceutical, delayed planar images were obtained in multiple projections. Additionally, delayed triplanar SPECT images were obtained through the area of interest. RADIOPHARMACEUTICALS:  21.9 mCi Tc-67m MDP COMPARISON:  None. FINDINGS: There is a medium size focus of intense radiotracer uptake localizing to the posterior and superior aspect of the L2 vertebral body. Here, there is a compression fracture with loss of approximately 25% of the vertebral body height as demonstrated on the CT from 11/06/2020. Asymmetric increased radiotracer uptake localizes to the nondisplaced left zone 1 sacral insufficiency fracture. Mild increased  uptake at the L5-S1 level compatible with degenerative disc disease. Other: On the CT portion of the exam there is extensive aortic atherosclerosis. Calcified gallstones identified within the gallbladder. Midline infraumbilical ventral abdominal wall hernia contains fat only. IMPRESSION: 1. There is intense radiotracer uptake localizing to the superior endplate fracture deformity involving the L2 vertebral body compatible with acute to subacute fracture. 2. Mild asymmetric increased uptake localizes to the left sacral wing insufficiency fracture demonstrated on previous CT. Electronically Signed   By: Kerby Moors M.D.   On: 11/14/2020 15:44     Medications   Scheduled Meds:  enoxaparin (LOVENOX) injection  40 mg Subcutaneous Q24H   estradiol  1 Applicatorful Vaginal Once per day on Mon Wed Fri   insulin aspart  0-9 Units Subcutaneous TID WC   lidocaine  1 patch Transdermal Q24H   meloxicam  7.5 mg Oral Daily   metoprolol succinate  25 mg Oral BID   potassium chloride  40 mEq Oral Once   Continuous Infusions:  [START ON 11/15/2020]  ceFAZolin (ANCEF) IV         LOS: 0 days    Time spent: 30 minutes    Ezekiel Slocumb, DO [facility]  11/14/2020, 8:07 PM      If 7PM-7AM, please contact night-coverage. How to contact the [facility] Attending or Consulting provider [employer] or covering provider during after hours [place], for this patient?    Check the care team in [facility] and look for a) attending/consulting [facility] provider listed and b) the [facility] team listed Log into [email or web address] and use [place]'s universal password to access. If you do not have the password, please contact the hospital operator. Locate the [facility] provider you are looking for under [facility] and page to a number that you can be directly reached. If you still have difficulty reaching the provider, please page the [facility] (Director on Call) for the Hospitalists listed on amion for assistance.

## 2020-11-15 ENCOUNTER — Inpatient Hospital Stay: Payer: Medicare Other | Admitting: Anesthesiology

## 2020-11-15 ENCOUNTER — Ambulatory Visit: Payer: BLUE CROSS/BLUE SHIELD

## 2020-11-15 ENCOUNTER — Encounter
Admission: EM | Disposition: A | Discharge status: Skilled Nursing Facility | Payer: Self-pay | Source: Home / Self Care | Attending: Internal Medicine

## 2020-11-15 ENCOUNTER — Inpatient Hospital Stay: Payer: Medicare Other

## 2020-11-15 HISTORY — PX: KYPHOPLASTY: SHX5884

## 2020-11-15 HISTORY — PX: SACROPLASTY: SHX6797

## 2020-11-15 LAB — GLUCOSE, CAPILLARY
Glucose-Capillary: 162 mg/dL — ABNORMAL HIGH (ref 70–99)
Glucose-Capillary: 151 mg/dL — ABNORMAL HIGH (ref 70–99)
Glucose-Capillary: 123 mg/dL — ABNORMAL HIGH (ref 70–99)
Glucose-Capillary: 134 mg/dL — ABNORMAL HIGH (ref 70–99)

## 2020-11-15 LAB — HEMOGLOBIN A1C
Hgb A1c MFr Bld: 7.2 % — ABNORMAL HIGH (ref 4.8–5.6)
Mean Plasma Glucose: 160 mg/dL

## 2020-11-15 LAB — BASIC METABOLIC PANEL
Anion gap: 9 (ref 5–15)
BUN: 19 mg/dL (ref 8–23)
CO2: 30 mmol/L (ref 22–32)
Calcium: 8.9 mg/dL (ref 8.9–10.3)
Chloride: 101 mmol/L (ref 98–111)
Creatinine, Ser: 0.64 mg/dL (ref 0.44–1.00)
GFR, Estimated: >60 mL/min (ref >60)
Glucose, Bld: 170 mg/dL — ABNORMAL HIGH (ref 70–99)
Potassium: 3.9 mmol/L (ref 3.5–5.1)
Sodium: 140 mmol/L (ref 135–145)

## 2020-11-15 LAB — MAGNESIUM: Magnesium: 2 mg/dL (ref 1.7–2.4)

## 2020-11-15 SURGERY — KYPHOPLASTY
Anesthesia: General

## 2020-11-15 MED ORDER — DEXMEDETOMIDINE (PRECEDEX) IN NS 20 MCG/5ML (4 MCG/ML) IV SYRINGE
PREFILLED_SYRINGE | INTRAVENOUS | Status: DC | PRN
  Administered 2020-11-15: 4 ug via INTRAVENOUS
  Administered 2020-11-15: 12 ug via INTRAVENOUS

## 2020-11-15 MED ORDER — ONDANSETRON HCL 4 MG/2ML IJ SOLN: 4.0000 mg | Freq: Once | INTRAMUSCULAR | Status: DC | PRN

## 2020-11-15 MED ORDER — DEXMEDETOMIDINE (PRECEDEX) IN NS 20 MCG/5ML (4 MCG/ML) IV SYRINGE
PREFILLED_SYRINGE | INTRAVENOUS | Status: AC
  Filled 2020-11-15: qty 5

## 2020-11-15 MED ORDER — FENTANYL CITRATE (PF) 100 MCG/2ML IJ SOLN
INTRAMUSCULAR | Status: AC
  Administered 2020-11-15: 25 ug via INTRAVENOUS
  Filled 2020-11-15: qty 2

## 2020-11-15 MED ORDER — FENTANYL CITRATE (PF) 100 MCG/2ML IJ SOLN
25.0000 ug | INTRAMUSCULAR | Status: DC | PRN
  Administered 2020-11-15 (×3): 25 ug via INTRAVENOUS

## 2020-11-15 MED ORDER — ACETAMINOPHEN 10 MG/ML IV SOLN
INTRAVENOUS | Status: DC | PRN
  Administered 2020-11-15: 1000 mg via INTRAVENOUS

## 2020-11-15 MED ORDER — CEFAZOLIN SODIUM-DEXTROSE 1-4 GM/50ML-% IV SOLN
INTRAVENOUS | Status: AC
  Filled 2020-11-15: qty 50

## 2020-11-15 MED ORDER — PHENYLEPHRINE HCL (PRESSORS) 10 MG/ML IV SOLN
INTRAVENOUS | Status: DC | PRN
  Administered 2020-11-15 (×2): 200 ug via INTRAVENOUS

## 2020-11-15 MED ORDER — PROPOFOL 10 MG/ML IV BOLUS
INTRAVENOUS | Status: AC
  Filled 2020-11-15: qty 20

## 2020-11-15 MED ORDER — SODIUM CHLORIDE 0.9 % IV SOLN: INTRAVENOUS | Status: DC | PRN

## 2020-11-15 MED ORDER — PROPOFOL 500 MG/50ML IV EMUL
INTRAVENOUS | Status: DC | PRN
  Administered 2020-11-15: 30 ug/kg/min via INTRAVENOUS

## 2020-11-15 MED ORDER — IOHEXOL 180 MG/ML  SOLN
INTRAMUSCULAR | Status: DC | PRN
  Administered 2020-11-15: 10 mL via ORAL

## 2020-11-15 MED ORDER — FENTANYL CITRATE (PF) 100 MCG/2ML IJ SOLN
INTRAMUSCULAR | Status: DC | PRN
  Administered 2020-11-15 (×2): 25 ug via INTRAVENOUS

## 2020-11-15 MED ORDER — LIDOCAINE HCL (PF) 1 % IJ SOLN
INTRAMUSCULAR | Status: DC | PRN
  Administered 2020-11-15: 40 mL

## 2020-11-15 MED ORDER — BUPIVACAINE-EPINEPHRINE (PF) 0.5% -1:200000 IJ SOLN
INTRAMUSCULAR | Status: DC | PRN
  Administered 2020-11-15: 30 mL via PERINEURAL

## 2020-11-15 MED ORDER — 0.9 % SODIUM CHLORIDE (POUR BTL) OPTIME
TOPICAL | Status: DC | PRN
  Administered 2020-11-15: 20 mL

## 2020-11-15 MED ORDER — FENTANYL CITRATE (PF) 100 MCG/2ML IJ SOLN
INTRAMUSCULAR | Status: AC
  Filled 2020-11-15: qty 2

## 2020-11-15 SURGICAL SUPPLY — 25 items
CEMENT KYPHON CX01A KIT/MIXER (Cement) ×4 IMPLANT
DERMABOND ADVANCED (GAUZE/BANDAGES/DRESSINGS) ×1
DERMABOND ADVANCED .7 DNX12 (GAUZE/BANDAGES/DRESSINGS) ×1 IMPLANT
DEVICE BIOPSY BONE KYPHX (INSTRUMENTS) ×4 IMPLANT
DRAPE C-ARM XRAY 36X54 (DRAPES) ×2 IMPLANT
DURAPREP 26ML APPLICATOR (WOUND CARE) ×2 IMPLANT
GAUZE 4X4 16PLY ~~LOC~~+RFID DBL (SPONGE) ×2 IMPLANT
GLOVE SURG SYN 9.0  PF PI (GLOVE) ×2
GLOVE SURG SYN 9.0 PF PI (GLOVE) ×1 IMPLANT
GOWN SRG 2XL LVL 4 RGLN SLV (GOWNS) ×1 IMPLANT
GOWN STRL NON-REIN 2XL LVL4 (GOWNS) ×2
GOWN STRL REUS W/ TWL LRG LVL3 (GOWN DISPOSABLE) ×1 IMPLANT
GOWN STRL REUS W/TWL LRG LVL3 (GOWN DISPOSABLE) ×2
KIT OSTEOCOOL BONE ACCESS 10G (MISCELLANEOUS) ×2 IMPLANT
KIT TURNOVER KIT A (KITS) ×2 IMPLANT
MANIFOLD NEPTUNE II (INSTRUMENTS) ×2 IMPLANT
PACK KYPHOPLASTY (MISCELLANEOUS) ×2 IMPLANT
RENTAL RFA GENERATOR (MISCELLANEOUS) IMPLANT
STRAP SAFETY 5IN WIDE (MISCELLANEOUS) ×2 IMPLANT
SWABSTK COMLB BENZOIN TINCTURE (MISCELLANEOUS) ×2 IMPLANT
SYS CARTRIDGE BONE CEMENT 8ML (SYSTAGENIX WOUND MANAGEMENT) ×2
SYSTEM CARTRIDG BONE CEMNT 8ML (SYSTAGENIX WOUND MANAGEMENT) ×1 IMPLANT
SYSTEM GUN BONE FILLER SZ2 (MISCELLANEOUS) ×2 IMPLANT
TRAY KYPHOPAK 15/3 EXPRESS 1ST (MISCELLANEOUS) ×2 IMPLANT
TRAY KYPHOPAK 20/3 EXPRESS 1ST (MISCELLANEOUS) IMPLANT

## 2020-11-15 NOTE — Op Note (Signed)
11/13/2020 - 11/15/2020  7:05 PM  PATIENT:  Kara [name]   MRN: [id number]   PRE-OPERATIVE DIAGNOSIS:  closed wedge compression fracture of L2 and sacral insufficiency fracture   POST-OPERATIVE DIAGNOSIS:  closed wedge compression fracture of L2 and sacral insufficiency fracture   PROCEDURE:  Procedure(s): KYPHOPLASTY L2 with sacroplasty  SURGEON: Laurene Footman, MD   ASSISTANTS: None   ANESTHESIA:   local and MAC   EBL:  No intake/output data recorded.   BLOOD ADMINISTERED:none   DRAINS: none    LOCAL MEDICATIONS USED:  MARCAINE    and XYLOCAINE    SPECIMEN: L2 vertebral body biopsy   DISPOSITION OF SPECIMEN: Pathology   COUNTS:  YES   TOURNIQUET:  * No tourniquets in log *   IMPLANTS: Bone cement   DICTATION: .Dragon Dictation  patient was brought to the operating room and after adequate anesthesia was obtained the patient was placed prone.  C arm was brought in in good visualization of the affected level obtained on both AP and lateral projections.  After patient identification and timeout procedures were completed, local anesthetic was infiltrated with 10 cc 1% Xylocaine infiltrated subcutaneously.  This is done the area on the each side of the planned approach.  The back was then prepped and draped in the usual sterile manner and repeat timeout procedure carried out.  A spinal needle was brought down to the pedicle on the each side of L2 and a 50-50 mix of 1% Xylocaine half percent Sensorcaine with epinephrine total of 20 cc injected on each side.  After allowing this to set a small incision was made and the trocar was advanced into the vertebral body in an extrapedicular fashion.  Biopsy was obtained  drilling was carried out balloon inserted with inflation to 2.0 cc on the right and 2.0 cc on the left.  When the cement was appropriate consistency 3 cc were injected on the right and 4 cc on the left into the vertebral body with some extravasation laterally but not towards  the neural canal, good fill superior to inferior endplates and from right to left sides along the inferior endplate.  After the cement had set the trochar was removed and permanent C-arm views obtained.   The sacrum was then addressed with local anesthetic infiltrated on both sides with C arm getting good visualization of the SI joint trochars were then advanced between the neuroforamen and SI joint.  Drilling was carried out and then cement was inserted with about 4 cc on the right and 3 on the left getting good fill.  After a set the trochars removed and permanent C-arm views obtained The wounds were closed with Dermabond followed by Band-Aids   PLAN OF CARE: Continue as inpatient   PATIENT DISPOSITION:  PACU - hemodynamically stable.

## 2020-11-15 NOTE — Progress Notes (Signed)
Discussed bone scan results with patient showing L2 and sacral insufficiency fractures.  Patient still having severe pain we will plan on kyphoplasty at L2 and sacroplasty later today

## 2020-11-15 NOTE — Anesthesia Preprocedure Evaluation (Signed)
Anesthesia Evaluation  Patient identified by MRN, date of birth, ID band Patient awake    Reviewed: Allergy & Precautions, H&P , NPO status , Patient's Chart, lab work & pertinent test results, reviewed documented beta blocker date and time   Airway Mallampati: II   Neck ROM: full    Dental  (+) Poor Dentition   Pulmonary sleep apnea , former smoker,    Pulmonary exam normal        Cardiovascular Exercise Tolerance: Poor hypertension, On Medications + CAD  Normal cardiovascular exam Rhythm:regular Rate:Normal     Neuro/Psych  Neuromuscular disease negative psych ROS   GI/Hepatic Neg liver ROS, GERD  ,  Endo/Other  negative endocrine ROSdiabetes  Renal/GU negative Renal ROS  negative genitourinary   Musculoskeletal   Abdominal   Peds  Hematology  (+) Blood dyscrasia, anemia ,   Anesthesia Other Findings Past Medical History: No date: Allergy No date: Atrial fibrillation (HCC) No date: CAD (coronary artery disease) No date: Cancer ([place])     Comment:  UTERINE No date: Chronic anticoagulation No date: Chronic diarrhea No date: Decubitus ulcer     Comment:  sacral region No date: Diabetes ([place])     Comment:  diet controlled No date: Dysuria No date: Edema of lower extremity     Comment:  mainly right foot, slightly in left foot. 07/13/2020: Facial fracture due to fall [facility]) 06/09/2019: Femur fracture, left (HCC) No date: Fibrocystic breast disease No date: GERD (gastroesophageal reflux disease) No date: Glaucoma No date: Glaucoma No date: Hematuria No date: Hemorrhoids No date: History of colon polyps No date: History of pancreatitis No date: Hyperlipidemia No date: Hypertension No date: Hypokalemia No date: IBS (irritable bowel syndrome) No date: Microscopic hematuria No date: Mitral valve regurgitation No date: Osteoarthritis     Comment:  fingers No date: Pernicious anemia No date: Plantar  fasciitis No date: Recurrent UTI No date: Skin cancer No date: Sleep apnea, obstructive No date: Vaginal atrophy No date: Vitamin D deficiency No date: Yeast vaginitis Past Surgical History: 1980's: ABDOMINAL HYSTERECTOMY No date: ABDOMINAL SURGERY     Comment:  for villous polyp,,,many years ago 1940's: APPENDECTOMY 11/28/2005: ASCAD, s/p PTCA     Comment:  MID LESION  1970's: BREAST BIOPSY; Left 12/13/2015: CARPAL TUNNEL RELEASE; Right     Comment:  Procedure: CARPAL TUNNEL RELEASE;  Surgeon: Thornton Park, MD;  Location: [facility] ORS;  Service:               Orthopedics;  Laterality: Right; 2015: COLECTOMY 06/11/2019: INTRAMEDULLARY (IM) NAIL INTERTROCHANTERIC; Left     Comment:  Procedure: INTRAMEDULLARY (IM) NAIL INTERTROCHANTRIC;                Surgeon: Earnestine Leys, MD;  Location: [facility] ORS;                Service: Orthopedics;  Laterality: Left; No date: PACEMAKER PLACEMENT No date: radation     Comment:  for uterine cance No date: REFRACTIVE SURGERY     Comment:  for bilateral glaumoma 1936: TONSILLECTOMY   Reproductive/Obstetrics negative OB ROS                             Anesthesia Physical Anesthesia Plan  ASA: 3  Anesthesia Plan: General   Post-op Pain Management:    Induction:   PONV Risk Score and Plan:  Airway Management Planned:   Additional Equipment:   Intra-op Plan:   Post-operative Plan:   Informed Consent: I have reviewed the patients History and Physical, chart, labs and discussed the procedure including the risks, benefits and alternatives for the proposed anesthesia with the patient or authorized representative who has indicated his/her understanding and acceptance.     Dental Advisory Given  Plan Discussed with: CRNA  Anesthesia Plan Comments:         Anesthesia Quick Evaluation

## 2020-11-15 NOTE — Anesthesia Postprocedure Evaluation (Signed)
Anesthesia Post Note  Patient: Kara [name]  Procedure(s) Performed: KYPHOPLASTY, L2 SACROPLASTY  Patient location during evaluation: PACU Anesthesia Type: General Level of consciousness: awake and alert Pain management: pain level controlled Vital Signs Assessment: post-procedure vital signs reviewed and stable Respiratory status: spontaneous breathing, nonlabored ventilation, respiratory function stable and patient connected to nasal cannula oxygen Cardiovascular status: blood pressure returned to baseline and stable Postop Assessment: no apparent nausea or vomiting Anesthetic complications: no   No notable events documented.   Last Vitals:  Vitals:   11/15/20 2015 11/15/20 2030  BP: (!) 149/64 (!) 154/97  Pulse: 88 86  Resp: 15 20  Temp:  36.4 C  SpO2: 96% 100%    Last Pain:  Vitals:   11/15/20 2030  TempSrc:   PainSc: 5                  Molli Barrows

## 2020-11-15 NOTE — Transfer of Care (Signed)
Immediate Anesthesia Transfer of Care Note  Patient: Kara [name]  Procedure(s) Performed: [employer] SACROPLASTY  Patient Location: PACU  Anesthesia Type:General  Level of Consciousness: drowsy  Airway & Oxygen Therapy: Patient Spontanous Breathing  Post-op Assessment: Report given to RN and Post -op Vital signs reviewed and stable  Post vital signs: Reviewed  Last Vitals:  Vitals Value Taken Time  BP    Temp    Pulse 71 11/15/20 1902  Resp 14 11/15/20 1902  SpO2 95 % 11/15/20 1902  Vitals shown include unvalidated device data.  Last Pain:  Vitals:   11/15/20 1528  TempSrc:   PainSc: 0-No pain         Complications: No notable events documented.

## 2020-11-15 NOTE — Anesthesia Procedure Notes (Signed)
Date/Time: 11/15/2020 6:00 PM Performed by: Doreen Salvage, CRNA Pre-anesthesia Checklist: Patient identified, Emergency Drugs available, Suction available and Patient being monitored Patient Re-evaluated:Patient Re-evaluated prior to induction Oxygen Delivery Method: Nasal cannula Induction Type: IV induction Dental Injury: Teeth and Oropharynx as per pre-operative assessment  Comments: Nasal cannula with etCO2 monitoring

## 2020-11-15 NOTE — Care Management Important Message (Signed)
Important Message  Patient Details  Name: Kara [name] MRN: [id number] Date of Birth: 1922/10/12   Medicare Important Message Given:  N/A - LOS <3 / Initial given by admissions  Initial Medicare IM reviewed with patient by Brantley Fling, Patient Access Associate on 11/15/2020 at 10:21am.    Dannette Barbara 11/15/2020, 1:11 PM

## 2020-11-15 NOTE — Progress Notes (Signed)
PROGRESS NOTE    Kara [name]   OJJ:[id number]  DOB: 1922-06-19  PCP: Crecencio Mc, MD    DOA: 11/13/2020 LOS: 1   Assessment & Plan   Principal Problem:   Compression fx, lumbar spine, sequela Active Problems:   Chronic diarrhea   History of colectomy   Type 2 diabetes mellitus with diabetic neuropathy, unspecified (HCC)   Hypokalemia   Atrial fibrillation, chronic (HCC)   Lumbar spinal stenosis   Pressure injury of skin   Intractable back pain   Lumbar spine compression fracture Sacral insufficiency fracture --Orthopedics, Dr. Rudene Christians following --Kyphoplasty planned for later today --Pain control: Mobic, oxycodone, Lidoderm patch --add Robaxin PRN  muscle spasms --Fall precautions --possible kyphoplasty --bone scan for further evaluation ?any other pain source aside from above?     Hypokalemia - resolved.  Secondary to GI loss from diarrhea which is chronic, patient is status post colectomy. Replace potassium as needed.  Monitor Mg as well.    Chronic atrial fibrillation - rate controlled --continue metoprolol --not on anticoagulation due to increased risk for falls      Diabetes mellitus with complications of diabetic neuropathy Maintain consistent carbohydrate diet Blood sugar checks 3 times daily   Postmenopausal atrophic vaginitis - estradiol vaginal cream 3 times/week   Patient BMI: There is no height or weight on file to calculate BMI.   DVT prophylaxis: enoxaparin (LOVENOX) injection 40 mg Start: 11/13/20 2200   Diet:  Diet Orders (From admission, onward)     Start     Ordered   11/15/20 0800  Diet NPO time specified  Diet effective ____        11/14/20 1759              Code Status: DNR   Brief Narrative / Hospital Course to Date:   Kara [name] is a 87 y.o. female with medical history significant for atrial fibrillation, coronary artery disease, diet-controlled diabetes mellitus who presents to the emergency room for evaluation  of back pain which she has had intermittently for the last 4 weeks but has progressively worsened.  Reported also having radiation of pain down R leg, associated muscle spasms.  No loss of bowel or bladder function, and no weakness of numbness.  Due to severe debilitating pain despite outpatient pain medications, patient admitted to the hospital for further management and evaluation.  Orthopedic surgery consulted.  Imaging (see full reports): Lumbar spine CT shows superior endplate compression fracture of L2 with  approximately 25% height loss and minimal, 2-3 mm retropulsion of the superior endplate.  Left-sided, nondisplaced zone 1 sacral insufficiency fracture. Multilevel degenerative changes of the lumbar spine resulting in up to moderate left neural foraminal narrowing at L3-L4 and moderate-severe spinal canal narrowing at L4-L5.     Subjective 11/15/20    Pt awake laying in bed.  She reports pain worse today, needed to use morphine, states it helped.  Reports kypho planned for later today, hopes it resolves her pain so she can get back up doing things.  No other acute complaints.     Disposition Plan & Communication   Status is: Inpatient  Remains inpatient appropriate because: pain uncontrolled; procedure planned  Dispo: The patient is from: Home              Anticipated d/c is to: SNF              Patient currently is not medically stable to d/c.   Difficult to place  patient No    Consults, Procedures, Significant Events   Consultants:  Orthopedic surgery, Dr. Rudene Christians  Procedures:  None  Antimicrobials:  Anti-infectives (From admission, onward)    Start     Dose/Rate Route Frequency Ordered Stop   11/15/20 1800  [MAR Hold]  ceFAZolin (ANCEF) IVPB 1 g/50 mL premix        (MAR Hold since Thu 11/15/2020 at 1521.Hold Reason: Transfer to a Procedural area)   1 g 100 mL/hr over 30 Minutes Intravenous 60 min pre-op 11/14/20 1759     11/15/20 1524  ceFAZolin (ANCEF) 1-4  GM/50ML-% IVPB       Note to Pharmacy: Jordan Hawks  : cabinet override      11/15/20 1524 11/16/20 0329         Micro    Objective   Vitals:   11/15/20 0417 11/15/20 0739 11/15/20 1203 11/15/20 1528  BP: 137/79 (!) 156/93 (!) 111/58 140/79  Pulse: 85 79 78 74  Resp: 18  17 18   Temp: 98.3 F (36.8 C) 98.5 F (36.9 C) 97.6 F (36.4 C) (!) 97 F (36.1 C)  TempSrc:  Oral Oral   SpO2: 93% 91% 95% 96%    Intake/Output Summary (Last 24 hours) at 11/15/2020 1730 Last data filed at 11/15/2020 0016 Gross per 24 hour  Intake --  Output 1 ml  Net -1 ml   There were no vitals filed for this visit.  Physical Exam:  General exam: awake, alert, no acute distress HEENT: small pupils, moist mucus membranes, hearing grossly normal  Respiratory system: CTAB, no wheezes, rales or rhonchi, normal respiratory effort. Cardiovascular system: RRR, no peripheral edema.   Central nervous system: A&O x3. no gross focal neurologic deficits, normal speech Extremities: no edema, normal tone Psychiatry: normal mood, congruent affect, judgement and insight appear normal  Labs   Data Reviewed: I have personally reviewed following labs and imaging studies  CBC: Recent Labs  Lab 11/09/20 1400 11/13/20 1358 11/14/20 0538  WBC 10.5 9.6 8.6  NEUTROABS 7.9* 6.2  --   HGB 15.1* 14.9 13.7  HCT 44.4 44.1 41.4  MCV 91.4 92.8 93.0  PLT 211 177 008   Basic Metabolic Panel: Recent Labs  Lab 11/09/20 1400 11/13/20 1358 11/14/20 0538 11/15/20 0539  NA 139 138 139 140  K 3.1* 3.3* 2.8* 3.9  CL 96* 95* 97* 101  CO2 33* 35* 32 30  GLUCOSE 158* 236* 169* 170*  BUN 28* 21 19 19   CREATININE 0.71 0.73 0.70 0.64  CALCIUM 9.4 9.2 8.9 8.9  MG  --  1.8  --  2.0   GFR: Estimated Creatinine Clearance: 32.5 mL/min (by C-G formula based on SCr of 0.64 mg/dL). Liver Function Tests: No results for input(s): AST, ALT, ALKPHOS, BILITOT, PROT, ALBUMIN in the last 168 hours. No results for input(s):  LIPASE, AMYLASE in the last 168 hours. No results for input(s): AMMONIA in the last 168 hours. Coagulation Profile: No results for input(s): INR, PROTIME in the last 168 hours. Cardiac Enzymes: No results for input(s): CKTOTAL, CKMB, CKMBINDEX, TROPONINI in the last 168 hours. BNP (last 3 results) No results for input(s): PROBNP in the last 8760 hours. HbA1C: Recent Labs    11/14/20 0538  HGBA1C 7.2*   CBG: Recent Labs  Lab 11/14/20 1641 11/14/20 2221 11/15/20 0741 11/15/20 1202 11/15/20 1522  GLUCAP 169* 192* 162* 151* 123*   Lipid Profile: No results for input(s): CHOL, HDL, LDLCALC, TRIG, CHOLHDL, LDLDIRECT in the last 72  hours. Thyroid Function Tests: No results for input(s): TSH, T4TOTAL, FREET4, T3FREE, THYROIDAB in the last 72 hours. Anemia Panel: No results for input(s): VITAMINB12, FOLATE, FERRITIN, TIBC, IRON, RETICCTPCT in the last 72 hours. Sepsis Labs: No results for input(s): PROCALCITON, LATICACIDVEN in the last 168 hours.  Recent Results (from the past 240 hour(s))  Resp Panel by RT-PCR (Flu A&B, Covid) Nasopharyngeal Swab     Status: None   Collection Time: 11/13/20  2:00 PM   Specimen: Nasopharyngeal Swab; Nasopharyngeal(NP) swabs in vial transport medium  Result Value Ref Range Status   SARS Coronavirus 2 by RT PCR NEGATIVE NEGATIVE Final    Comment: (NOTE) SARS-CoV-2 target nucleic acids are NOT DETECTED.  The SARS-CoV-2 RNA is generally detectable in upper respiratory specimens during the acute phase of infection. The lowest concentration of SARS-CoV-2 viral copies this assay can detect is 138 copies/mL. A negative result does not preclude SARS-Cov-2 infection and should not be used as the sole basis for treatment or other patient management decisions. A negative result may occur with  improper specimen collection/handling, submission of specimen other than nasopharyngeal swab, presence of viral mutation(s) within the areas targeted by this  assay, and inadequate number of viral copies(<138 copies/mL). A negative result must be combined with clinical observations, patient history, and epidemiological information. The expected result is Negative.  Fact Sheet for Patients:  EntrepreneurPulse.com.au  Fact Sheet for Healthcare Providers:  IncredibleEmployment.be  This test is no t yet approved or cleared by the [place] FDA and  has been authorized for detection and/or diagnosis of SARS-CoV-2 by FDA under an Emergency Use Authorization (EUA). This EUA will remain  in effect (meaning this test can be used) for the duration of the COVID-19 declaration under Section 564(b)(1) of the Act, 21 U.S.C.section 360bbb-3(b)(1), unless the authorization is terminated  or revoked sooner.       Influenza A by PCR NEGATIVE NEGATIVE Final   Influenza B by PCR NEGATIVE NEGATIVE Final    Comment: (NOTE) The Xpert Xpress SARS-CoV-2/FLU/RSV plus assay is intended as an aid in the diagnosis of influenza from Nasopharyngeal swab specimens and should not be used as a sole basis for treatment. Nasal washings and aspirates are unacceptable for Xpert Xpress SARS-CoV-2/FLU/RSV testing.  Fact Sheet for Patients: EntrepreneurPulse.com.au  Fact Sheet for Healthcare Providers: IncredibleEmployment.be  This test is not yet approved or cleared by the [place] FDA and has been authorized for detection and/or diagnosis of SARS-CoV-2 by FDA under an Emergency Use Authorization (EUA). This EUA will remain in effect (meaning this test can be used) for the duration of the COVID-19 declaration under Section 564(b)(1) of the Act, 21 U.S.C. section 360bbb-3(b)(1), unless the authorization is terminated or revoked.  Performed at  [facility], [address] [place], [place] [address]       Imaging Studies   NM Bone W/Spect  Result Date: 11/14/2020 CLINICAL  DATA:  Severe back pain EXAM: NM BONE SCAN AND SPECT IMAGING TECHNIQUE: After intravenous injection of radiopharmaceutical, delayed planar images were obtained in multiple projections. Additionally, delayed triplanar SPECT images were obtained through the area of interest. RADIOPHARMACEUTICALS:  21.9 mCi Tc-65m MDP COMPARISON:  None. FINDINGS: There is a medium size focus of intense radiotracer uptake localizing to the posterior and superior aspect of the L2 vertebral body. Here, there is a compression fracture with loss of approximately 25% of the vertebral body height as demonstrated on the CT from 11/06/2020. Asymmetric increased radiotracer uptake localizes to the nondisplaced  left zone 1 sacral insufficiency fracture. Mild increased uptake at the L5-S1 level compatible with degenerative disc disease. Other: On the CT portion of the exam there is extensive aortic atherosclerosis. Calcified gallstones identified within the gallbladder. Midline infraumbilical ventral abdominal wall hernia contains fat only. IMPRESSION: 1. There is intense radiotracer uptake localizing to the superior endplate fracture deformity involving the L2 vertebral body compatible with acute to subacute fracture. 2. Mild asymmetric increased uptake localizes to the left sacral wing insufficiency fracture demonstrated on previous CT. Electronically Signed   By: Kerby Moors M.D.   On: 11/14/2020 15:44     Medications   Scheduled Meds:  [MAR Hold] enoxaparin (LOVENOX) injection  40 mg Subcutaneous Q24H   [MAR Hold] estradiol  1 Applicatorful Vaginal Once per day on Mon Wed Fri   [facility] Hold] insulin aspart  0-9 Units Subcutaneous TID WC   [MAR Hold] lidocaine  1 patch Transdermal Q24H   [MAR Hold] meloxicam  7.5 mg Oral Daily   [MAR Hold] metoprolol succinate  25 mg Oral BID   Continuous Infusions:  ceFAZolin     [MAR Hold]  ceFAZolin (ANCEF) IV         LOS: 1 day    Time spent: 25 minutes    Ezekiel Slocumb,  DO [facility]  11/15/2020, 5:30 PM      If 7PM-7AM, please contact night-coverage. How to contact the [facility] Attending or Consulting provider [place] or covering provider during after hours [place], for this patient?    Check the care team in [facility] and look for a) attending/consulting [facility] provider listed and b) the [facility] team listed Log into [email or web address] and use [place]'s universal password to access. If you do not have the password, please contact the hospital operator. Locate the [facility] provider you are looking for under [facility] and page to a number that you can be directly reached. If you still have difficulty reaching the provider, please page the [facility] (Director on Call) for the Hospitalists listed on amion for assistance.

## 2020-11-15 NOTE — Plan of Care (Signed)
  Problem: Health Behavior/Discharge Planning: Goal: Ability to manage health-related needs will improve Outcome: Progressing   Problem: Clinical Measurements: Goal: Ability to maintain clinical measurements within normal limits will improve Outcome: Progressing Goal: Will remain free from infection Outcome: Progressing   Problem: Clinical Measurements: Goal: Will remain free from infection Outcome: Progressing

## 2020-11-15 NOTE — Progress Notes (Signed)
PT Cancellation Note  Patient Details Name: Kara [name] MRN: [id number] DOB: June 23, 1922   Cancelled Treatment:    Reason Eval/Treat Not Completed: Other (comment). Chart reviewed, pt scheduled for surgery later today in PM. PT to cancel orders, please re-consult when patient is medically appropriate s/p surgery.   Lieutenant Diego PT, DPT 8:36 AM,11/15/20

## 2020-11-15 NOTE — Progress Notes (Signed)
OT Cancellation Note  Patient Details Name: Kara [name] MRN: [id number] DOB: [birth year]   Cancelled Treatment:    Reason Eval/Treat Not Completed: Patient not medically ready. Chart reviewed, pt scheduled for surgery later today in PM. PT to cancel orders, please re-consult when patient is medically appropriate s/p surgery.  Dessie Coma, M.S. OTR/L  11/15/20, 8:40 AM  ascom [phone]

## 2020-11-16 ENCOUNTER — Encounter: Payer: Self-pay | Admitting: Orthopedic Surgery

## 2020-11-16 LAB — GLUCOSE, CAPILLARY
Glucose-Capillary: 140 mg/dL — ABNORMAL HIGH (ref 70–99)
Glucose-Capillary: 187 mg/dL — ABNORMAL HIGH (ref 70–99)
Glucose-Capillary: 144 mg/dL — ABNORMAL HIGH (ref 70–99)
Glucose-Capillary: 263 mg/dL — ABNORMAL HIGH (ref 70–99)

## 2020-11-16 MED ORDER — METOPROLOL SUCCINATE ER 25 MG PO TB24: 25.0000 mg | ORAL_TABLET | Freq: Two times a day (BID) | ORAL | Status: DC

## 2020-11-16 MED ORDER — MELOXICAM 7.5 MG PO TABS: 7.5000 mg | ORAL_TABLET | Freq: Every day | ORAL | Status: DC | PRN

## 2020-11-16 MED ORDER — ONDANSETRON HCL 4 MG PO TABS: 4.0000 mg | ORAL_TABLET | Freq: Four times a day (QID) | ORAL | 0 refills | Status: DC | PRN

## 2020-11-16 MED ORDER — DOCUSATE SODIUM 100 MG PO CAPS: 100.0000 mg | ORAL_CAPSULE | Freq: Every day | ORAL | Status: AC | PRN

## 2020-11-16 MED ORDER — OXYCODONE HCL 5 MG PO TABS: 2.5000 mg | ORAL_TABLET | Freq: Three times a day (TID) | ORAL | 0 refills | Status: DC | PRN

## 2020-11-16 MED ORDER — LIDOCAINE 5 % EX PTCH
1.0000 | MEDICATED_PATCH | CUTANEOUS | 0 refills | Status: DC
Start: 2020-11-16 — End: 2021-08-20

## 2020-11-16 NOTE — Evaluation (Signed)
Occupational Therapy Evaluation Patient Details Name: Kara [name] MRN: [id number] DOB: 02/15/1923 Today's Date: 11/16/2020    History of Present Illness Pt is a 87 y/o F admitted on 11/13/20 with c/c of back pain x 4 weeks. Pt found to have a closed wedge compression fx of L2 & sacral insufficiency fx. Pt underwent Kyphoplasty L2 with sacroplasty on 11/15/20 by Dr. Rudene Christians. PMH: a-fib, CAD, DM, uterine CA, glaucoma, mitral valve regurgitation, skin CA, OSA, OA, pacemaker   Clinical Impression   Pt seen for OT evaluation this date, POD#1 from surgery above. Prior to hospital admission, pt was modified independent with mobility using a rollator, generally independent with ADL, and had a caregiver assist with folding laundry, making the bed, and driving her to the grocery store x1/wk. Pt also endorses receiving home health OT/PT/SLP 2x/wk. Pt alert and oriented, pleasant, and able to follow commands with cues to maintain back precautions. Decreased recall of precautions from PT session earlier today but able to return demo with additional instruction and occasional cues during session. Pt required CGA fr rolling onto her side and MIN A for sidelying to sit EOB. CGA to stand with RW + VC for hand placement in order to maintain her back precautions. Pt and caregiver present educated in back precautions and how to maintain during ADL/mobility, log roll technique for bed mobility, AE/DME, falls prevention, set up and safety for bathing in walk in shower (pt at baseline was using shower chair to proper her foot on to wash LEs); handout provided to support recall and carryover. Pt verbalized understanding. Pt would benefit from skilled OT services to maximize return to PLOF, minimize risk of future falls, and minimize caregiver burden or need for higher level of care long term. Recommend short term rehab to meet these goals.     Follow Up Recommendations  SNF    Equipment Recommendations  3 in 1 bedside commode     Recommendations for Other Services       Precautions / Restrictions Precautions Precautions: Fall;Back Precaution Booklet Issued: Yes (comment) Precaution Comments: no bending, lifting, twisting, arching Restrictions Weight Bearing Restrictions: No      Mobility Bed Mobility Overal bed mobility: Needs Assistance Bed Mobility: Rolling;Sidelying to Sit Rolling: Min guard Sidelying to sit: Min assist       General bed mobility comments: bed flat, use of bed rails, cues for sequencing, MIN A to support upright    Transfers Overall transfer level: Needs assistance Equipment used: Rolling walker (2 wheeled) Transfers: Sit to/from Stand Sit to Stand: Min guard         General transfer comment: VC for hand placement    Balance Overall balance assessment: Needs assistance Sitting-balance support: Feet supported;No upper extremity supported;Single extremity supported Sitting balance-Leahy Scale: Good     Standing balance support: During functional activity;Bilateral upper extremity supported Standing balance-Leahy Scale: Fair                             ADL either performed or assessed with clinical judgement   ADL Overall ADL's : Needs assistance/impaired     Grooming: Sitting;Set up;Supervision/safety   Upper Body Bathing: Sitting;Set up;Minimal assistance Upper Body Bathing Details (indicate cue type and reason): Min A for back Lower Body Bathing: Sitting/lateral leans;Minimal assistance   Upper Body Dressing : Sitting;Supervision/safety;Set up   Lower Body Dressing: Sit to/from stand;Minimal assistance Lower Body Dressing Details (indicate cue type and reason): Min A  to thread over feet and maintain back precautions Toilet Transfer: Min guard;BSC;RW   Toileting- Clothing Manipulation and Hygiene: Set up;Min guard;Sit to/from stand       Functional mobility during ADLs: Min guard;Rolling walker       Vision Baseline Vision/History: Wears  glasses Wears Glasses: At all times Patient Visual Report: No change from baseline       Perception     Praxis      Pertinent Vitals/Pain Pain Assessment: 0-10 Pain Score: 1  Pain Location: "my bottom" Pain Descriptors / Indicators: Aching;Discomfort Pain Intervention(s): Limited activity within patient's tolerance;Monitored during session;Repositioned     Hand Dominance Right   Extremity/Trunk Assessment Upper Extremity Assessment Upper Extremity Assessment: Generalized weakness   Lower Extremity Assessment Lower Extremity Assessment: Generalized weakness   Cervical / Trunk Assessment Cervical / Trunk Assessment: Other exceptions Cervical / Trunk Exceptions: s/p kyphoplasty and sacroplasty   Communication Communication Communication: No difficulties   Cognition Arousal/Alertness: Awake/alert Behavior During Therapy: WFL for tasks assessed/performed Overall Cognitive Status: Within Functional Limits for tasks assessed                                     General Comments       Exercises Other Exercises Other Exercises: Pt/caregiver educated in back precautions and how to maintain during ADL/mobility, log roll technique for bed mobility, AE/DME, falls prevention, set up and safety for bathing in walk in shower (pt at baseline was using shower chair to proper her foot on to wash LEs); handout provided to support recall and carryover   Shoulder Instructions      Home Living Family/patient expects to be discharged to:: Private residence Living Arrangements: Alone Available Help at Discharge: Personal care attendant;Available PRN/intermittently Type of Home: Independent living facility (apartment at the [facility]) Home Access: Level entry     Home Layout: One level     Bathroom Shower/Tub: Occupational psychologist: [place] - single point;Walker - 2 wheels;Shower seat;Hand held shower head (rollator)           Prior Functioning/Environment Level of Independence: Independent with assistive device(s);Needs assistance  Gait / Transfers Assistance Needed: mod I with rollator, only one fall in past 6 months ADL's / Homemaking Assistance Needed: [place] but has assistance for folding it, has house cleaner   Comments: personal care aide drives her to grocery store, makes bed, assists around house as needed 1x/week; pt reports having OT/PT/SLP 2x/wk        OT Problem List: Decreased strength;Pain;Decreased safety awareness;Impaired balance (sitting and/or standing);Decreased knowledge of use of DME or AE;Impaired UE functional use;Decreased knowledge of precautions      OT Treatment/Interventions: Self-care/ADL training;Therapeutic exercise;Therapeutic activities;DME and/or AE instruction;Patient/family education;Balance training    OT Goals(Current goals can be found in the care plan section) Acute Rehab OT Goals Patient Stated Goal: return to PLOF OT Goal Formulation: With patient Time For Goal Achievement: 11/30/20 Potential to Achieve Goals: Good ADL Goals Pt Will Perform Lower Body Dressing: with supervision;with set-up;with adaptive equipment;sit to/from stand (maintaining back precautions) Pt Will Transfer to Toilet: with supervision;ambulating;regular height toilet (LRAD for amb, maintaining back precautions) Pt Will Perform Toileting - Clothing Manipulation and hygiene: sit to/from stand;with supervision Additional ADL Goal #1: Pt/caregiver will independently verbalize 100% of back precautions and how to maintain during bed mobility, LB dressing, toileting, and  bathing to maximize safety and adherence to back precautions.  OT Frequency: Min 2X/week   Barriers to D/C:            Co-evaluation              AM-PAC OT "6 Clicks" Daily Activity     Outcome Measure Help from another person eating meals?: None Help from another person taking care of personal  grooming?: None Help from another person toileting, which includes using toliet, bedpan, or urinal?: A Little Help from another person bathing (including washing, rinsing, drying)?: A Little Help from another person to put on and taking off regular upper body clothing?: None Help from another person to put on and taking off regular lower body clothing?: A Little 6 Click Score: 21   End of Session    Activity Tolerance: Patient tolerated treatment well Patient left: in chair;with call bell/phone within reach;with chair alarm set;Other (comment) (caregiver present)  OT Visit Diagnosis: Other abnormalities of gait and mobility (R26.89);Muscle weakness (generalized) (M62.81);History of falling (Z91.81);Pain Pain - Right/Left:  (sacrum)                Time: 1446-1530 OT Time Calculation (min): 44 min Charges:  OT General Charges $OT Visit: 1 Visit OT Evaluation $OT Eval Moderate Complexity: 1 Mod OT Treatments $Self Care/Home Management : 38-52 mins  Hanley Hays, MPH, MS, OTR/L ascom [phone] 11/16/20, 3:58 PM

## 2020-11-16 NOTE — Progress Notes (Signed)
PROGRESS NOTE    Kara [name]   WHQ:[id number]  DOB: Nov 29, 1922  PCP: Crecencio Mc, MD    DOA: 11/13/2020 LOS: 2   Assessment & Plan   Principal Problem:   Compression fx, lumbar spine, sequela Active Problems:   Chronic diarrhea   History of colectomy   Type 2 diabetes mellitus with diabetic neuropathy, unspecified (HCC)   Hypokalemia   Atrial fibrillation, chronic (HCC)   Lumbar spinal stenosis   Pressure injury of skin   Intractable back pain   Lumbar spine compression fracture Sacral insufficiency fracture --Orthopedics, Dr. Rudene Christians following --Status post kyphoplasty  and sacroplasty on 11/15/20 --Pain control: Mobic, oxycodone, Lidoderm patch --add Robaxin PRN  muscle spasms --Fall precautions   Hypokalemia - resolved.  Secondary to GI loss from diarrhea which is chronic, patient is status post colectomy. Replace potassium as needed.  Monitor Mg as well.    Chronic atrial fibrillation - rate controlled --continue metoprolol --not on anticoagulation due to increased risk for falls      Diabetes mellitus with complications of diabetic neuropathy Maintain consistent carbohydrate diet Blood sugar checks 3 times daily   Postmenopausal atrophic vaginitis - estradiol vaginal cream 3 times/week   Patient BMI: Body mass index is 22.3 kg/m.   DVT prophylaxis: enoxaparin (LOVENOX) injection 40 mg Start: 11/13/20 2200   Diet:  Diet Orders (From admission, onward)     Start     Ordered   11/16/20 0039  Diet regular Room service appropriate? Yes; Fluid consistency: Thin  Diet effective now       Question Answer Comment  Room service appropriate? Yes   Fluid consistency: Thin      11/16/20 0038              Code Status: DNR   Brief Narrative / Hospital Course to Date:   Kara [name] is a 87 y.o. female with medical history significant for atrial fibrillation, coronary artery disease, diet-controlled diabetes mellitus who presents to the emergency  room for evaluation of back pain which she has had intermittently for the last 4 weeks but has progressively worsened.  Reported also having radiation of pain down R leg, associated muscle spasms.  No loss of bowel or bladder function, and no weakness of numbness.  Due to severe debilitating pain despite outpatient pain medications, patient admitted to the hospital for further management and evaluation.  Orthopedic surgery consulted.  Imaging (see full reports): Lumbar spine CT shows superior endplate compression fracture of L2 with  approximately 25% height loss and minimal, 2-3 mm retropulsion of the superior endplate.  Left-sided, nondisplaced zone 1 sacral insufficiency fracture. Multilevel degenerative changes of the lumbar spine resulting in up to moderate left neural foraminal narrowing at L3-L4 and moderate-severe spinal canal narrowing at L4-L5.     Subjective 11/16/20    Pt reports pain better controlled after procedures yesterday. Ambulated with PT this AM.  Reports at times difficulty getting comfortable.  No other acute complaints.  Hopeful to go to rehab tomorrow.   Disposition Plan & Communication   Status is: Inpatient  Remains inpatient appropriate because: requires SNF placement, pending.  Possible d/c tomorrow if pain controlled adequately.  Dispo: The patient is from: Home              Anticipated d/c is to: SNF              Patient currently is not medically stable to d/c.   Difficult to place patient  No    Consults, Procedures, Significant Events   Consultants:  Orthopedic surgery, Dr. Rudene Christians  Procedures:  None  Antimicrobials:  Anti-infectives (From admission, onward)    Start     Dose/Rate Route Frequency Ordered Stop   11/15/20 1800  ceFAZolin (ANCEF) IVPB 1 g/50 mL premix        1 g 100 mL/hr over 30 Minutes Intravenous 60 min pre-op 11/14/20 1759 11/15/20 1840   11/15/20 1524  ceFAZolin (ANCEF) 1-4 GM/50ML-% IVPB       Note to Pharmacy: Jordan Hawks  : cabinet override      11/15/20 1524 11/15/20 1818         Micro    Objective   Vitals:   11/16/20 0542 11/16/20 0758 11/16/20 1150 11/16/20 1544  BP: 133/75 128/75 121/89 106/71  Pulse: 86 88 65 91  Resp: 18 18 16    Temp: 97.6 F (36.4 C) 99 F (37.2 C) 98.4 F (36.9 C) 98.6 F (37 C)  TempSrc: Oral     SpO2: 92% 92% 96% 93%  Weight:      Height:        Intake/Output Summary (Last 24 hours) at 11/16/2020 1758 Last data filed at 11/16/2020 1023 Gross per 24 hour  Intake 690 ml  Output 300 ml  Net 390 ml   Filed Weights   11/15/20 1742  Weight: 55.3 kg    Physical Exam:  General exam: awake, alert, no acute distress Respiratory system: normal respiratory effort, on room air. Cardiovascular system: RRR, no peripheral edema.   Central nervous system: A&O x3. Grossly non-focal exam, normal speech   Labs   Data Reviewed: I have personally reviewed following labs and imaging studies  CBC: Recent Labs  Lab 11/13/20 1358 11/14/20 0538  WBC 9.6 8.6  NEUTROABS 6.2  --   HGB 14.9 13.7  HCT 44.1 41.4  MCV 92.8 93.0  PLT 177 093   Basic Metabolic Panel: Recent Labs  Lab 11/13/20 1358 11/14/20 0538 11/15/20 0539  NA 138 139 140  K 3.3* 2.8* 3.9  CL 95* 97* 101  CO2 35* 32 30  GLUCOSE 236* 169* 170*  BUN 21 19 19   CREATININE 0.73 0.70 0.64  CALCIUM 9.2 8.9 8.9  MG 1.8  --  2.0   GFR: Estimated Creatinine Clearance: 32.5 mL/min (by C-G formula based on SCr of 0.64 mg/dL). Liver Function Tests: No results for input(s): AST, ALT, ALKPHOS, BILITOT, PROT, ALBUMIN in the last 168 hours. No results for input(s): LIPASE, AMYLASE in the last 168 hours. No results for input(s): AMMONIA in the last 168 hours. Coagulation Profile: No results for input(s): INR, PROTIME in the last 168 hours. Cardiac Enzymes: No results for input(s): CKTOTAL, CKMB, CKMBINDEX, TROPONINI in the last 168 hours. BNP (last 3 results) No results for input(s): PROBNP in  the last 8760 hours. HbA1C: Recent Labs    11/14/20 0538  HGBA1C 7.2*   CBG: Recent Labs  Lab 11/15/20 1522 11/15/20 2156 11/16/20 0801 11/16/20 1151 11/16/20 1544  GLUCAP 123* 134* 140* 187* 144*   Lipid Profile: No results for input(s): CHOL, HDL, LDLCALC, TRIG, CHOLHDL, LDLDIRECT in the last 72 hours. Thyroid Function Tests: No results for input(s): TSH, T4TOTAL, FREET4, T3FREE, THYROIDAB in the last 72 hours. Anemia Panel: No results for input(s): VITAMINB12, FOLATE, FERRITIN, TIBC, IRON, RETICCTPCT in the last 72 hours. Sepsis Labs: No results for input(s): PROCALCITON, LATICACIDVEN in the last 168 hours.  Recent Results (from the past 240  hour(s))  Resp Panel by RT-PCR (Flu A&B, Covid) Nasopharyngeal Swab     Status: None   Collection Time: 11/13/20  2:00 PM   Specimen: Nasopharyngeal Swab; Nasopharyngeal(NP) swabs in vial transport medium  Result Value Ref Range Status   SARS Coronavirus 2 by RT PCR NEGATIVE NEGATIVE Final    Comment: (NOTE) SARS-CoV-2 target nucleic acids are NOT DETECTED.  The SARS-CoV-2 RNA is generally detectable in upper respiratory specimens during the acute phase of infection. The lowest concentration of SARS-CoV-2 viral copies this assay can detect is 138 copies/mL. A negative result does not preclude SARS-Cov-2 infection and should not be used as the sole basis for treatment or other patient management decisions. A negative result may occur with  improper specimen collection/handling, submission of specimen other than nasopharyngeal swab, presence of viral mutation(s) within the areas targeted by this assay, and inadequate number of viral copies(<138 copies/mL). A negative result must be combined with clinical observations, patient history, and epidemiological information. The expected result is Negative.  Fact Sheet for Patients:  EntrepreneurPulse.com.au  Fact Sheet for Healthcare Providers:   IncredibleEmployment.be  This test is no t yet approved or cleared by the [place] FDA and  has been authorized for detection and/or diagnosis of SARS-CoV-2 by FDA under an Emergency Use Authorization (EUA). This EUA will remain  in effect (meaning this test can be used) for the duration of the COVID-19 declaration under Section 564(b)(1) of the Act, 21 U.S.C.section 360bbb-3(b)(1), unless the authorization is terminated  or revoked sooner.       Influenza A by PCR NEGATIVE NEGATIVE Final   Influenza B by PCR NEGATIVE NEGATIVE Final    Comment: (NOTE) The Xpert Xpress SARS-CoV-2/FLU/RSV plus assay is intended as an aid in the diagnosis of influenza from Nasopharyngeal swab specimens and should not be used as a sole basis for treatment. Nasal washings and aspirates are unacceptable for Xpert Xpress SARS-CoV-2/FLU/RSV testing.  Fact Sheet for Patients: EntrepreneurPulse.com.au  Fact Sheet for Healthcare Providers: IncredibleEmployment.be  This test is not yet approved or cleared by the [place] FDA and has been authorized for detection and/or diagnosis of SARS-CoV-2 by FDA under an Emergency Use Authorization (EUA). This EUA will remain in effect (meaning this test can be used) for the duration of the COVID-19 declaration under Section 564(b)(1) of the Act, 21 U.S.C. section 360bbb-3(b)(1), unless the authorization is terminated or revoked.  Performed at [facility], [address] [place], [place] [address]       Imaging Studies   DG Lumbar Spine 2-3 Views  Result Date: 11/15/2020 CLINICAL DATA:  Kyphoplasty EXAM: LUMBAR SPINE - 2-3 VIEW; DG C-ARM 1-60 MIN COMPARISON:  CT 11/06/2020 FINDINGS: Five low resolution intraoperative spot views of the lumbosacral region. Total fluoroscopy time was 1 minutes 11 seconds. Images demonstrate vertebral augmentation at presumed L2 level. The images also  demonstrate augmentation of sacral insufficiency fracture. IMPRESSION: Intraoperative fluoroscopic assistance provided during treatment of lumbar and sacral fractures. Electronically Signed   By: Donavan Foil M.D.   On: 11/15/2020 21:11   DG C-Arm 1-60 Min  Result Date: 11/15/2020 CLINICAL DATA:  Kyphoplasty EXAM: LUMBAR SPINE - 2-3 VIEW; DG C-ARM 1-60 MIN COMPARISON:  CT 11/06/2020 FINDINGS: Five low resolution intraoperative spot views of the lumbosacral region. Total fluoroscopy time was 1 minutes 11 seconds. Images demonstrate vertebral augmentation at presumed L2 level. The images also demonstrate augmentation of sacral insufficiency fracture. IMPRESSION: Intraoperative fluoroscopic assistance provided during treatment of lumbar and sacral  fractures. Electronically Signed   By: Donavan Foil M.D.   On: 11/15/2020 21:11     Medications   Scheduled Meds:  enoxaparin (LOVENOX) injection  40 mg Subcutaneous Q24H   estradiol  1 Applicatorful Vaginal Once per day on Mon Wed Fri   insulin aspart  0-9 Units Subcutaneous TID WC   lidocaine  1 patch Transdermal Q24H   meloxicam  7.5 mg Oral Daily   metoprolol succinate  25 mg Oral BID   Continuous Infusions:       LOS: 2 days    Time spent: 20 minutes    Ezekiel Slocumb, DO [facility]  11/16/2020, 5:58 PM      If 7PM-7AM, please contact night-coverage. How to contact the [facility] Attending or Consulting provider [place] or covering provider during after hours [place], for this patient?    Check the care team in [facility] and look for a) attending/consulting [facility] provider listed and b) the [facility] team listed Log into [email or web address] and use [place]'s universal password to access. If you do not have the password, please contact the hospital operator. Locate the [facility] provider you are looking for under [facility] and page to a number that you can be directly reached. If you still have difficulty reaching the provider, please page  the [facility] (Director on Call) for the Hospitalists listed on amion for assistance.

## 2020-11-16 NOTE — Discharge Summary (Addendum)
Physician Discharge Summary  Kara [name] ZTI:[id number] DOB: July 18, 1922 DOA: 11/13/2020  PCP: Crecencio Mc, MD  Admit date: 11/13/2020 Discharge date: 11/17/2020   Admitted From: home Disposition:  SNF  Recommendations for Outpatient Follow-up:  Follow up with PCP in 1-2 weeks Please obtain BMP/CBC in one week Please follow up with Dr. Rudene Christians, orthopedic surgery, in 2 weeks   Home Health: N/A Equipment/Devices: None   Discharge Condition: Stable  CODE STATUS: DNR  Diet recommendation: Regular     Discharge Diagnoses: Principal Problem:   Compression fx, lumbar spine, sequela Active Problems:   Chronic diarrhea   History of colectomy   Type 2 diabetes mellitus with diabetic neuropathy, unspecified (HCC)   Hypokalemia   Atrial fibrillation, chronic (HCC)   Lumbar spinal stenosis   Pressure injury of skin   Intractable back pain    Summary of HPI and Hospital Course:  Kara [name] is a 87 y.o. female with medical history significant for atrial fibrillation, coronary artery disease, diet-controlled diabetes mellitus who presents to the emergency room for evaluation of back pain which she has had intermittently for the last 4 weeks but has progressively worsened.  Reported also having radiation of pain down R leg, associated muscle spasms.  No loss of bowel or bladder function, and no weakness of numbness.  Due to severe debilitating pain despite outpatient pain medications, patient admitted to the hospital for further management and evaluation.  Orthopedic surgery consulted.  Imaging (see full reports): Lumbar spine CT shows superior endplate compression fracture of L2 with  approximately 25% height loss and minimal, 2-3 mm retropulsion of the superior endplate.  Left-sided, nondisplaced zone 1 sacral insufficiency fracture. Multilevel degenerative changes of the lumbar spine resulting in up to moderate left neural foraminal narrowing at L3-L4 and moderate-severe spinal canal  narrowing at L4-L5.      Lumbar spine compression fracture Sacral insufficiency fracture Orthopedics, Dr. Rudene Christians consulted Status post Kyphoplasty and sacroplasty on 11/15/20 --Pain control: Mobic, PRN oxycodone, Lidoderm patch --Home Skelaxin PRN fpr muscle spasms --Fall precautions     Hypokalemia - resolved.  Secondary to GI loss from diarrhea which is chronic, patient is status post colectomy. Replace potassium as needed.  Monitor Mg as well.     Chronic atrial fibrillation - rate controlled --continue metoprolol --not on anticoagulation due to increased risk for falls      Diabetes mellitus with complications of diabetic neuropathy Maintain consistent carbohydrate diet Blood sugar checks 3 times daily     Postmenopausal atrophic vaginitis - estradiol vaginal cream 3 times/week   Discharge Instructions    Allergies as of 11/16/2020       Reactions   Baycol [cerivastatin Sodium]    Cardizem [diltiazem Hcl]    LOWER EXTREMITY EDEMA   Crestor [rosuvastatin Calcium] Other (See Comments)   INTOLERANT   Dronedarone Other (See Comments)   Fatigue   Metoprolol Tartrate Other (See Comments)   Omeprazole Other (See Comments)   Pravachol    INTOLERANT   Statins Other (See Comments)   Vioxx [rofecoxib]    Zocor [simvastatin]    INTOLERANT        Medication List     STOP taking these medications    cefdinir 300 MG capsule Commonly known as: OMNICEF       TAKE these medications    acetaminophen 500 MG tablet Commonly known as: TYLENOL Take 500 mg by mouth every 6 (six) hours as needed for mild pain or moderate pain.  cyanocobalamin 1000 MCG/ML injection Commonly known as: (VITAMIN B-12) INJECT 1ML INTO THE MUSCLE EVERY 14 DAYS What changed: See the new instructions.   Dermacloud Oint Apply 1 application topically as needed.   diphenoxylate-atropine 2.5-0.025 MG tablet Commonly known as: LOMOTIL Take 1 tablet by mouth 4 (four) times daily as needed  for diarrhea or loose stools.   docusate sodium 100 MG capsule Commonly known as: Colace Take 1 capsule (100 mg total) by mouth daily as needed for up to 10 days for mild constipation or moderate constipation.   estradiol 0.1 MG/GM vaginal cream Commonly known as: ESTRACE Place 1 Applicatorful vaginally daily as needed (vaginal atrophy).   lidocaine 5 % Commonly known as: LIDODERM Place 1 patch onto the skin daily. Remove & Discard patch within 12 hours or as directed by MD   meloxicam 7.5 MG tablet Commonly known as: MOBIC Take 1 tablet (7.5 mg total) by mouth daily as needed for pain.   metaxalone 800 MG tablet Commonly known as: SKELAXIN Take 400 mg by mouth 3 (three) times daily as needed for muscle spasms.   metoprolol succinate 25 MG 24 hr tablet Commonly known as: TOPROL-XL Take 1 tablet (25 mg total) by mouth 2 (two) times daily. What changed: See the new instructions.   ondansetron 4 MG tablet Commonly known as: ZOFRAN Take 1 tablet (4 mg total) by mouth every 6 (six) hours as needed for nausea.   oxyCODONE 5 MG immediate release tablet Commonly known as: Roxicodone Take 0.5 tablets (2.5 mg total) by mouth every 8 (eight) hours as needed for up to 7 days for severe pain or breakthrough pain.   triamcinolone cream 0.1 % Commonly known as: KENALOG APPLY 1 APPLICATION TOPICALLY 2 TIMES DAILY TO VULVA UNTIL ITCHING RESOLVES THEN REDUCE USE TO TWICE WEEKLY. What changed: See the new instructions.        Contact information for follow-up providers     Hessie Knows, MD Follow up in 2 week(s).   Specialty: Orthopedic Surgery Why: For wound care Contact information: [place] [address] [phone]              Contact information for after-discharge care     Destination     [facility] .   Service: Skilled Nursing Contact information: [address] [place] [phone]                    Allergies  Allergen Reactions   Baycol [Cerivastatin Sodium]    Cardizem [Diltiazem Hcl]     LOWER EXTREMITY EDEMA   Crestor [Rosuvastatin Calcium] Other (See Comments)    INTOLERANT   Dronedarone Other (See Comments)    Fatigue   Metoprolol Tartrate Other (See Comments)   Omeprazole Other (See Comments)   Pravachol     INTOLERANT   Statins Other (See Comments)   Vioxx [Rofecoxib]    Zocor [Simvastatin]     INTOLERANT     If you experience worsening of your admission symptoms, develop shortness of breath, life threatening emergency, suicidal or homicidal thoughts you must seek medical attention immediately by calling 911 or calling your MD immediately  if symptoms less severe.    Please note   You were cared for by a hospitalist during your hospital stay. If you have any questions about your discharge medications or the care you received while you were in the hospital after you are discharged, you  can call the unit and asked to speak with the hospitalist on call if the hospitalist that took care of you is not available. Once you are discharged, your primary care physician will handle any further medical issues. Please note that NO REFILLS for any discharge medications will be authorized once you are discharged, as it is imperative that you return to your primary care physician (or establish a relationship with a primary care physician if you do not have one) for your aftercare needs so that they can reassess your need for medications and monitor your lab values.   Consultations: Orthopedic surgery, Dr. Rudene Christians   Procedures/Studies: DG Lumbar Spine 2-3 Views  Result Date: 11/15/2020 CLINICAL DATA:  Kyphoplasty EXAM: LUMBAR SPINE - 2-3 VIEW; DG C-ARM 1-60 MIN COMPARISON:  CT 11/06/2020 FINDINGS: Five low resolution intraoperative spot views of the lumbosacral region. Total fluoroscopy time was 1 minutes 11 seconds. Images  demonstrate vertebral augmentation at presumed L2 level. The images also demonstrate augmentation of sacral insufficiency fracture. IMPRESSION: Intraoperative fluoroscopic assistance provided during treatment of lumbar and sacral fractures. Electronically Signed   By: Donavan Foil M.D.   On: 11/15/2020 21:11   CT Lumbar Spine Wo Contrast  Result Date: 11/06/2020 CLINICAL DATA:  Lumbar radiculopathy, > 6 wks persistent right sided sciatica, pacemaker EXAM: CT LUMBAR SPINE WITHOUT CONTRAST TECHNIQUE: Multidetector CT imaging of the lumbar spine was performed without intravenous contrast administration. Multiplanar CT image reconstructions were also generated. COMPARISON:  CT abdomen and 12/28/2011 FINDINGS: Segmentation: 5 lumbar type vertebrae. Alignment: Straightening of the normal lumbar lordosis with mild dextroconvex curvature. Trace anterolisthesis at L4-L5. Vertebrae: There is a superior endplate compression fracture of L2, with approximately 25% height loss (sagittal image 33). There is a nondisplaced left zone 1 sacral insufficiency fracture (axial image 23). Paraspinal and other soft tissues: Aorta bi-iliac atherosclerotic calcifications. Disc levels: T12-L1: No significant spinal canal or neural foraminal narrowing. L1-L2: Minimal, 2-3 mm retropulsion of the L2 superior endplate resulting in no significant spinal canal narrowing. There is asymmetric right disc bulging resulting in mild right neural foraminal narrowing. L2-L3: Posterior disc osteophyte complex and facet arthropathy results in no significant spinal canal or neural foraminal narrowing. L3-L4: Posterior disc osteophyte complex and facet arthropathy results in mild right and moderate left neural foraminal narrowing. Mild spinal canal narrowing. L4-L5: Trace anterolisthesis with broad-based disc bulging and facet arthropathy results in mild bilateral neural foraminal narrowing and moderate-severe spinal canal narrowing. L5-S1: Broad-based  disc bulging and facet arthropathy results in mild bilateral neural foraminal narrowing. IMPRESSION: Superior endplate compression fracture of L2 with approximately 25% height loss and minimal, 2-3 mm retropulsion of the superior endplate. Left-sided, nondisplaced zone 1 sacral insufficiency fracture. Multilevel degenerative changes of the lumbar spine resulting in up to moderate left neural foraminal narrowing at L3-L4 and moderate-severe spinal canal narrowing at L4-L5. Additional degenerative changes as described above. Aortic Atherosclerosis (ICD10-I70.0). Electronically Signed   By: Maurine Simmering   On: 11/06/2020 15:30   NM Bone W/Spect  Result Date: 11/14/2020 CLINICAL DATA:  Severe back pain EXAM: NM BONE SCAN AND SPECT IMAGING TECHNIQUE: After intravenous injection of radiopharmaceutical, delayed planar images were obtained in multiple projections. Additionally, delayed triplanar SPECT images were obtained through the area of interest. RADIOPHARMACEUTICALS:  21.9 mCi Tc-80m MDP COMPARISON:  None. FINDINGS: There is a medium size focus of intense radiotracer uptake localizing to the posterior and superior aspect of the L2 vertebral body. Here, there is a compression fracture with loss of approximately  25% of the vertebral body height as demonstrated on the CT from 11/06/2020. Asymmetric increased radiotracer uptake localizes to the nondisplaced left zone 1 sacral insufficiency fracture. Mild increased uptake at the L5-S1 level compatible with degenerative disc disease. Other: On the CT portion of the exam there is extensive aortic atherosclerosis. Calcified gallstones identified within the gallbladder. Midline infraumbilical ventral abdominal wall hernia contains fat only. IMPRESSION: 1. There is intense radiotracer uptake localizing to the superior endplate fracture deformity involving the L2 vertebral body compatible with acute to subacute fracture. 2. Mild asymmetric increased uptake localizes to the  left sacral wing insufficiency fracture demonstrated on previous CT. Electronically Signed   By: Kerby Moors M.D.   On: 11/14/2020 15:44   DG C-Arm 1-60 Min  Result Date: 11/15/2020 CLINICAL DATA:  Kyphoplasty EXAM: LUMBAR SPINE - 2-3 VIEW; DG C-ARM 1-60 MIN COMPARISON:  CT 11/06/2020 FINDINGS: Five low resolution intraoperative spot views of the lumbosacral region. Total fluoroscopy time was 1 minutes 11 seconds. Images demonstrate vertebral augmentation at presumed L2 level. The images also demonstrate augmentation of sacral insufficiency fracture. IMPRESSION: Intraoperative fluoroscopic assistance provided during treatment of lumbar and sacral fractures. Electronically Signed   By: Donavan Foil M.D.   On: 11/15/2020 21:11   DG Hip Unilat W OR W/O Pelvis 2-3 Views Right  Result Date: 10/20/2020 CLINICAL DATA:  Right SI and hip pain aggravated by movement. No fall. EXAM: DG HIP (WITH OR WITHOUT PELVIS) 2-3V RIGHT COMPARISON:  None. FINDINGS: A gamma nail and rod extends through the left hip at a site of previous fracture. 2 soft tissue calcifications project just lateral to the iliac bone and superior to the right hip with a nonacute appearance, likely from previous injury. No acute fracture or dislocation of the right hip. No significant loss of joint space. Mild degenerative changes. Degenerative changes in the lower lumbar spine. Vascular calcifications. No other abnormalities. IMPRESSION: Mild degenerative changes in the right hip without loss of significant joint space. Degenerative changes in the lower lumbar spine. Soft tissue calcifications superior to the right hip have a nonacute appearance, likely from remote injury. Electronically Signed   By: Dorise Bullion III M.D   On: 10/20/2020 09:44    11/16/31 - Kyphoplasty and sacroplasty   Subjective: Pt doing well this AM. Reports pain better controlled, still pain with movement but feels it's improved.  No other acute complaints.     Discharge Exam: Vitals:   11/16/20 0758 11/16/20 1150  BP: 128/75 121/89  Pulse: 88 65  Resp: 18 16  Temp: 99 F (37.2 C) 98.4 F (36.9 C)  SpO2: 92% 96%   Vitals:   11/15/20 2348 11/16/20 0542 11/16/20 0758 11/16/20 1150  BP: 106/71 133/75 128/75 121/89  Pulse: 78 86 88 65  Resp: 18 18 18 16   Temp: 97.8 F (36.6 C) 97.6 F (36.4 C) 99 F (37.2 C) 98.4 F (36.9 C)  TempSrc: Oral Oral    SpO2: 93% 92% 92% 96%  Weight:      Height:        General: Pt is alert, awake, not in acute distress Cardiovascular: RRR, S1/S2 +, no rubs, no gallops Respiratory: CTA bilaterally, no wheezing, no rhonchi Abdominal: Soft, NT, ND, bowel sounds + Extremities: no edema, no cyanosis    The results of significant diagnostics from this hospitalization (including imaging, microbiology, ancillary and laboratory) are listed below for reference.     Microbiology: Recent Results (from the past 240 hour(s))  Resp Panel  by RT-PCR (Flu A&B, Covid) Nasopharyngeal Swab     Status: None   Collection Time: 11/13/20  2:00 PM   Specimen: Nasopharyngeal Swab; Nasopharyngeal(NP) swabs in vial transport medium  Result Value Ref Range Status   SARS Coronavirus 2 by RT PCR NEGATIVE NEGATIVE Final    Comment: (NOTE) SARS-CoV-2 target nucleic acids are NOT DETECTED.  The SARS-CoV-2 RNA is generally detectable in upper respiratory specimens during the acute phase of infection. The lowest concentration of SARS-CoV-2 viral copies this assay can detect is 138 copies/mL. A negative result does not preclude SARS-Cov-2 infection and should not be used as the sole basis for treatment or other patient management decisions. A negative result may occur with  improper specimen collection/handling, submission of specimen other than nasopharyngeal swab, presence of viral mutation(s) within the areas targeted by this assay, and inadequate number of viral copies(<138 copies/mL). A negative result must be combined  with clinical observations, patient history, and epidemiological information. The expected result is Negative.  Fact Sheet for Patients:  EntrepreneurPulse.com.au  Fact Sheet for Healthcare Providers:  IncredibleEmployment.be  This test is no t yet approved or cleared by the [place] FDA and  has been authorized for detection and/or diagnosis of SARS-CoV-2 by FDA under an Emergency Use Authorization (EUA). This EUA will remain  in effect (meaning this test can be used) for the duration of the COVID-19 declaration under Section 564(b)(1) of the Act, 21 U.S.C.section 360bbb-3(b)(1), unless the authorization is terminated  or revoked sooner.       Influenza A by PCR NEGATIVE NEGATIVE Final   Influenza B by PCR NEGATIVE NEGATIVE Final    Comment: (NOTE) The Xpert Xpress SARS-CoV-2/FLU/RSV plus assay is intended as an aid in the diagnosis of influenza from Nasopharyngeal swab specimens and should not be used as a sole basis for treatment. Nasal washings and aspirates are unacceptable for Xpert Xpress SARS-CoV-2/FLU/RSV testing.  Fact Sheet for Patients: EntrepreneurPulse.com.au  Fact Sheet for Healthcare Providers: IncredibleEmployment.be  This test is not yet approved or cleared by the [place] FDA and has been authorized for detection and/or diagnosis of SARS-CoV-2 by FDA under an Emergency Use Authorization (EUA). This EUA will remain in effect (meaning this test can be used) for the duration of the COVID-19 declaration under Section 564(b)(1) of the Act, 21 U.S.C. section 360bbb-3(b)(1), unless the authorization is terminated or revoked.  Performed at [facility], [address] [place], [place] [address]      Labs: BNP (last 3 results) No results for input(s): BNP in the last 8760 hours. Basic Metabolic Panel: Recent Labs  Lab 11/09/20 1400 11/13/20 1358 11/14/20 0538  11/15/20 0539  NA 139 138 139 140  K 3.1* 3.3* 2.8* 3.9  CL 96* 95* 97* 101  CO2 33* 35* 32 30  GLUCOSE 158* 236* 169* 170*  BUN 28* 21 19 19   CREATININE 0.71 0.73 0.70 0.64  CALCIUM 9.4 9.2 8.9 8.9  MG  --  1.8  --  2.0   Liver Function Tests: No results for input(s): AST, ALT, ALKPHOS, BILITOT, PROT, ALBUMIN in the last 168 hours. No results for input(s): LIPASE, AMYLASE in the last 168 hours. No results for input(s): AMMONIA in the last 168 hours. CBC: Recent Labs  Lab 11/09/20 1400 11/13/20 1358 11/14/20 0538  WBC 10.5 9.6 8.6  NEUTROABS 7.9* 6.2  --   HGB 15.1* 14.9 13.7  HCT 44.4 44.1 41.4  MCV 91.4 92.8 93.0  PLT 211 177 176  Cardiac Enzymes: No results for input(s): CKTOTAL, CKMB, CKMBINDEX, TROPONINI in the last 168 hours. BNP: Invalid input(s): POCBNP CBG: Recent Labs  Lab 11/15/20 1202 11/15/20 1522 11/15/20 2156 11/16/20 0801 11/16/20 1151  GLUCAP 151* 123* 134* 140* 187*   D-Dimer No results for input(s): DDIMER in the last 72 hours. Hgb A1c Recent Labs    11/14/20 0538  HGBA1C 7.2*   Lipid Profile No results for input(s): CHOL, HDL, LDLCALC, TRIG, CHOLHDL, LDLDIRECT in the last 72 hours. Thyroid function studies No results for input(s): TSH, T4TOTAL, T3FREE, THYROIDAB in the last 72 hours.  Invalid input(s): FREET3 Anemia work up No results for input(s): VITAMINB12, FOLATE, FERRITIN, TIBC, IRON, RETICCTPCT in the last 72 hours. Urinalysis    Component Value Date/Time   COLORURINE STRAW (A) 11/09/2020 1425   APPEARANCEUR CLEAR (A) 11/09/2020 1425   APPEARANCEUR Clear 05/27/2019 1002   LABSPEC 1.005 11/09/2020 1425   LABSPEC 1.016 12/26/2011 1844   PHURINE 7.0 11/09/2020 1425   GLUCOSEU NEGATIVE 11/09/2020 1425   GLUCOSEU NEGATIVE 10/19/2020 1201   HGBUR SMALL (A) 11/09/2020 1425   BILIRUBINUR NEGATIVE 11/09/2020 [place] 10/19/2020 1207   BILIRUBINUR Negative 05/27/2019 1002   BILIRUBINUR Negative 12/26/2011  1844   KETONESUR NEGATIVE 11/09/2020 1425   PROTEINUR NEGATIVE 11/09/2020 1425   UROBILINOGEN 0.2 10/19/2020 1207   UROBILINOGEN 0.2 10/19/2020 1201   NITRITE NEGATIVE 11/09/2020 1425   LEUKOCYTESUR NEGATIVE 11/09/2020 1425   LEUKOCYTESUR 1+ 12/26/2011 1844   Sepsis Labs Invalid input(s): PROCALCITONIN,  WBC,  LACTICIDVEN Microbiology Recent Results (from the past 240 hour(s))  Resp Panel by RT-PCR (Flu A&B, Covid) Nasopharyngeal Swab     Status: None   Collection Time: 11/13/20  2:00 PM   Specimen: Nasopharyngeal Swab; Nasopharyngeal(NP) swabs in vial transport medium  Result Value Ref Range Status   SARS Coronavirus 2 by RT PCR NEGATIVE NEGATIVE Final    Comment: (NOTE) SARS-CoV-2 target nucleic acids are NOT DETECTED.  The SARS-CoV-2 RNA is generally detectable in upper respiratory specimens during the acute phase of infection. The lowest concentration of SARS-CoV-2 viral copies this assay can detect is 138 copies/mL. A negative result does not preclude SARS-Cov-2 infection and should not be used as the sole basis for treatment or other patient management decisions. A negative result may occur with  improper specimen collection/handling, submission of specimen other than nasopharyngeal swab, presence of viral mutation(s) within the areas targeted by this assay, and inadequate number of viral copies(<138 copies/mL). A negative result must be combined with clinical observations, patient history, and epidemiological information. The expected result is Negative.  Fact Sheet for Patients:  EntrepreneurPulse.com.au  Fact Sheet for Healthcare Providers:  IncredibleEmployment.be  This test is no t yet approved or cleared by the [place] FDA and  has been authorized for detection and/or diagnosis of SARS-CoV-2 by FDA under an Emergency Use Authorization (EUA). This EUA will remain  in effect (meaning this test can be used) for the duration  of the COVID-19 declaration under Section 564(b)(1) of the Act, 21 U.S.C.section 360bbb-3(b)(1), unless the authorization is terminated  or revoked sooner.       Influenza A by PCR NEGATIVE NEGATIVE Final   Influenza B by PCR NEGATIVE NEGATIVE Final    Comment: (NOTE) The Xpert Xpress SARS-CoV-2/FLU/RSV plus assay is intended as an aid in the diagnosis of influenza from Nasopharyngeal swab specimens and should not be used as a sole basis for treatment. Nasal washings and aspirates are unacceptable for Xpert  Xpress SARS-CoV-2/FLU/RSV testing.  Fact Sheet for Patients: EntrepreneurPulse.com.au  Fact Sheet for Healthcare Providers: IncredibleEmployment.be  This test is not yet approved or cleared by the [place] FDA and has been authorized for detection and/or diagnosis of SARS-CoV-2 by FDA under an Emergency Use Authorization (EUA). This EUA will remain in effect (meaning this test can be used) for the duration of the COVID-19 declaration under Section 564(b)(1) of the Act, 21 U.S.C. section 360bbb-3(b)(1), unless the authorization is terminated or revoked.  Performed at [facility], [address] [place], [place] [address]      Time coordinating discharge: Over 30 minutes  SIGNED:   Ezekiel Slocumb, DO [facility] 11/16/2020, 11:59 AM   If 7PM-7AM, please contact night-coverage [email or web address]

## 2020-11-16 NOTE — Progress Notes (Signed)
Subjective: 1 Day Post-Op Procedure(s) (LRB): KYPHOPLASTY, L2 (N/A) SACROPLASTY (N/A) Patient reports pain as moderate.   Patient is well, and has had no acute complaints or problems Plan is to go Rehab after hospital stay. Negative for chest pain and shortness of breath Fever: no Gastrointestinal: Negative for nausea and vomiting  Objective: Vital signs in last 24 hours: Temp:  [97 F (36.1 C)-98.5 F (36.9 C)] 97.6 F (36.4 C) (07/01 0542) Pulse Rate:  [63-95] 86 (07/01 0542) Resp:  [12-20] 18 (07/01 0542) BP: (76-156)/(43-97) 133/75 (07/01 0542) SpO2:  [91 %-100 %] 92 % (07/01 0542) Weight:  [55.3 kg] 55.3 kg (06/30 1742)  Intake/Output from previous day:  Intake/Output Summary (Last 24 hours) at 11/16/2020 0717 Last data filed at 11/15/2020 2350 Gross per 24 hour  Intake 450 ml  Output 300 ml  Net 150 ml    Intake/Output this shift: No intake/output data recorded.  Labs: Recent Labs    11/13/20 1358 11/14/20 0538  HGB 14.9 13.7   Recent Labs    11/13/20 1358 11/14/20 0538  WBC 9.6 8.6  RBC 4.75 4.45  HCT 44.1 41.4  PLT 177 176   Recent Labs    11/14/20 0538 11/15/20 0539  NA 139 140  K 2.8* 3.9  CL 97* 101  CO2 32 30  BUN 19 19  CREATININE 0.70 0.64  GLUCOSE 169* 170*  CALCIUM 8.9 8.9   No results for input(s): LABPT, INR in the last 72 hours.   EXAM General - Patient is Alert and Oriented Extremity - Dorsiflexion/Plantar flexion intact Dressing/Incision - clean, dry, no drainage Motor Function - intact, moving foot and toes well on exam.   Past Medical History:  Diagnosis Date   Allergy    Atrial fibrillation (HCC)    CAD (coronary artery disease)    Cancer (HCC)    UTERINE   Chronic anticoagulation    Chronic diarrhea    Decubitus ulcer    sacral region   Diabetes ([place])    diet controlled   Dysuria    Edema of lower extremity    mainly right foot, slightly in left foot.   Facial fracture due to fall ([place]) 07/13/2020   Femur  fracture, left ([place]) 06/09/2019   Fibrocystic breast disease    GERD (gastroesophageal reflux disease)    Glaucoma    Glaucoma    Hematuria    Hemorrhoids    History of colon polyps    History of pancreatitis    Hyperlipidemia    Hypertension    Hypokalemia    IBS (irritable bowel syndrome)    Microscopic hematuria    Mitral valve regurgitation    Osteoarthritis    fingers   Pernicious anemia    Plantar fasciitis    Recurrent UTI    Skin cancer    Sleep apnea, obstructive    Vaginal atrophy    Vitamin D deficiency    Yeast vaginitis     Assessment/Plan: 1 Day Post-Op Procedure(s) (LRB): KYPHOPLASTY, L2 (N/A) SACROPLASTY (N/A) Principal Problem:   Compression fx, lumbar spine, sequela Active Problems:   Chronic diarrhea   History of colectomy   Type 2 diabetes mellitus with diabetic neuropathy, unspecified (HCC)   Hypokalemia   Atrial fibrillation, chronic (HCC)   Lumbar spinal stenosis   Pressure injury of skin   Intractable back pain  Estimated body mass index is 22.3 kg/m as calculated from the following:   Height as of this encounter: 5\' 2"  (1.575 m).  Weight as of this encounter: 55.3 kg. Advance diet Up with therapy D/C IV fluids  Follow-up with [facility] clinic orthopedics in 2 weeks with Dr. Rudene Christians for wound care.  DVT Prophylaxis - Lovenox   Reche Dixon, PA-C Orthopaedic Surgery 11/16/2020, 7:17 AM

## 2020-11-16 NOTE — NC FL2 (Signed)
[place] LEVEL OF CARE SCREENING TOOL     IDENTIFICATION  Patient Name: Kara [name] Birthdate: Feb 08, 1923 Sex: female Admission Date (Current Location): 11/13/2020  [facility] [place] Number:  Engineering geologist and Address:  [facility], [address], [place], [place] [address]      Provider Number: [id number]  Attending Physician Name and Address:  Ezekiel Slocumb, DO  Relative Name and Phone Number:       Current Level of Care: Hospital Recommended Level of Care: [place] Prior Approval Number:    Date Approved/Denied:   PASRR Number: [id number]  Discharge Plan: SNF    Current Diagnoses: Patient Active Problem List   Diagnosis Date Noted   Pressure injury of skin 11/14/2020   Intractable back pain 11/14/2020   Compression fx, lumbar spine, sequela 11/13/2020   Closed compression fracture of L2 lumbar vertebra, initial encounter ([place]) 11/08/2020   Lumbar spinal stenosis 11/08/2020   Left buttock pain 07/30/2020   Left hand pain 07/30/2020   History of recent fall 07/30/2020   Hospital discharge follow-up 07/22/2020   Intraparenchymal hemorrhage of brain ([place]) 07/13/2020   Syncope and collapse 07/13/2020   Orbital fracture ([place]) 07/13/2020   Hypokalemia 07/13/2020   Atrial fibrillation, chronic ([place]) 07/13/2020   HTN (hypertension) 07/13/2020   DNR (do not resuscitate) 07/13/2020   S/P placement of cardiac pacemaker 07/03/2020   Myalgia due to statin 07/03/2020   Aortic atherosclerosis ([place]) 07/03/2020   Displaced intertrochanteric fracture of left femur, sequela 06/25/2019   Age-related osteoporosis with current pathological fracture with routine healing 06/15/2019   Sciatica of right side 02/18/2019   Vaginitis and vulvovaginitis 12/26/2018   Educated about COVID-19 virus infection 10/12/2018   Edema of lower extremity 11/24/2017   Exertional dyspnea 06/16/2017   Venous insufficiency of both  lower extremities 11/24/2016   Essential hypertension 11/11/2016   Peripheral artery disease ([place]) 11/11/2016   Chronic pulmonary hypertension ([place]) 10/20/2016   Leg swelling 06/17/2016   Type 2 diabetes mellitus with diabetic neuropathy, unspecified ([place]) 06/17/2016   Urinary incontinence, urge 12/27/2015   Atrophic vaginitis 09/26/2015   Severe tricuspid valve insufficiency 84/16/6063   Umbilical hernia without obstruction and without gangrene 06/26/2015   Carpal tunnel syndrome of right wrist 05/29/2015   Chronic fatigue 05/01/2015   E. coli UTI 11/21/2014   Vaginal atrophy 11/21/2014   Urethral caruncle 11/21/2014   Mitral valve regurgitation 05/09/2014   Fatigue 03/09/2014   SSS (sick sinus syndrome) ([place]) 10/11/2013   Encounter for current long-term use of anticoagulants 10/06/2012   Osteoarthritis 03/08/2012   Screening for breast cancer 03/08/2012   Pernicious anemia 02/04/2012   History of colectomy 02/04/2012   Vitamin D deficiency 07/30/2011   Acquired thrombophilia ([place]) 06/13/2011   Coronary atherosclerosis due to calcified coronary lesion 06/13/2011   Chronic diarrhea 06/13/2011    Orientation RESPIRATION BLADDER Height & Weight     Self, Time, Situation, Place  Normal External catheter Weight: 55.3 kg Height:  5\' 2"  (157.5 cm)  BEHAVIORAL SYMPTOMS/MOOD NEUROLOGICAL BOWEL NUTRITION STATUS      Continent Diet (regular)  AMBULATORY STATUS COMMUNICATION OF NEEDS Skin   Extensive Assist Verbally Surgical wounds                       Personal Care Assistance Level of Assistance  Bathing, Dressing Bathing Assistance: Limited assistance   Dressing Assistance: Limited assistance     Functional Limitations Info  SPECIAL CARE FACTORS FREQUENCY  PT (By licensed PT), OT (By licensed OT)     PT Frequency: 5 times per week OT Frequency: 5 times per week            Contractures Contractures Info: Not present    Additional Factors Info   Code Status, Allergies Code Status Info: DNR Allergies Info: Baycol Cerivastatin Sodium, Cardizem Diltiazem Hcl, Crestor Rosuvastatin Calcium, Dronedarone, Metoprolol Tartrate, Omeprazole, Pravachol, Statins, Vioxx Rofecoxib, Zocor           Current Medications (11/16/2020):  This is the current hospital active medication list Current Facility-Administered Medications  Medication Dose Route Frequency Provider Last Rate Last Admin   acetaminophen (TYLENOL) tablet 325 mg  325 mg Oral Q6H PRN Hessie Knows, MD       enoxaparin (LOVENOX) injection 40 mg  40 mg Subcutaneous Q24H Hessie Knows, MD   40 mg at 11/15/20 2248   estradiol (ESTRACE) vaginal cream 1 Applicatorful  1 Applicatorful Vaginal Once per day on Mon Wed Fri Menz, Legrand Como, MD       insulin aspart (novoLOG) injection 0-9 Units  0-9 Units Subcutaneous TID WC Hessie Knows, MD       lidocaine (LIDODERM) 5 % 1 patch  1 patch Transdermal Q24H Hessie Knows, MD   1 patch at 11/14/20 1630   meloxicam (MOBIC) tablet 7.5 mg  7.5 mg Oral Daily Hessie Knows, MD   7.5 mg at 11/16/20 1031   methocarbamol (ROBAXIN) tablet 500 mg  500 mg Oral Q8H PRN Hessie Knows, MD   500 mg at 11/16/20 0802   metoprolol succinate (TOPROL-XL) 24 hr tablet 25 mg  25 mg Oral BID Hessie Knows, MD   25 mg at 11/16/20 1031   morphine 2 MG/ML injection 1 mg  1 mg Intravenous Q4H PRN Hessie Knows, MD   1 mg at 11/16/20 0801   ondansetron (ZOFRAN) tablet 4 mg  4 mg Oral Q6H PRN Hessie Knows, MD   4 mg at 11/15/20 5277   Or   ondansetron (ZOFRAN) injection 4 mg  4 mg Intravenous Q6H PRN Hessie Knows, MD       oxyCODONE (Oxy IR/ROXICODONE) immediate release tablet 2.5 mg  2.5 mg Oral Q8H PRN Hessie Knows, MD   2.5 mg at 11/15/20 2247     Discharge Medications: Please see discharge summary for a list of discharge medications.  Relevant Imaging Results:  Relevant Lab Results:   Additional Information SS#: [id number]  Su Hilt, RN

## 2020-11-16 NOTE — Evaluation (Signed)
Physical Therapy Evaluation Patient Details Name: Kara [name] MRN: [id number] DOB: 06-03-1922 Today's Date: 11/16/2020   History of Present Illness  Pt is a 87 y/o F admitted on 11/13/20 with c/c of back pain x 4 weeks. Pt found to have a closed wedge compression fx of L2 & sacral insufficiency fx. Pt underwent Kyphoplasty L2 with sacroplasty on 11/15/20 by Dr. Rudene Christians. PMH: a-fib, CAD, DM, uterine CA, glaucoma, mitral valve regurgitation, skin CA, OSA, OA, pacemaker  Clinical Impression  Pt seen for PT evaluation with pt reporting she's mod I with rollator in her ILF apartment prior to admission. PT educates pt on back precautions with pt able to recall 2/3 at end of session. PT also provides cuing for back precautions & discusses activity modifications in order to maintain precautions. Pt is able to complete bed mobility with supervision & use of bed rails & transfers & gait with RW & CGA. Pt demonstrates significantly decreased gait speed. At this time, pt is unsafe to d/c home alone & would benefit from STR upon d/c to maximize independence with functional mobility & reduce fall risk prior to return to [place] apartment.     Follow Up Recommendations SNF    Equipment Recommendations  None recommended by PT    Recommendations for Other Services       Precautions / Restrictions Precautions Precautions: Fall;Back Restrictions Weight Bearing Restrictions: No      Mobility  Bed Mobility Overal bed mobility: Needs Assistance Bed Mobility: Rolling;Sidelying to Sit Rolling: Supervision Sidelying to sit: Supervision       General bed mobility comments: bed flat, use of bed rails, extra time to upright trunk to sitting EOB, cuing/education re: log rolling    Transfers Overall transfer level: Needs assistance Equipment used: Rolling walker (2 wheeled) Transfers: Sit to/from Stand Sit to Stand: Min guard         General transfer comment: instructional cuing for safe hand  placement  Ambulation/Gait Ambulation/Gait assistance: Min guard Gait Distance (Feet): 75 Feet Assistive device: Rolling walker (2 wheeled) Gait Pattern/deviations: Step-through pattern;Decreased step length - right;Decreased step length - left;Decreased stride length Gait velocity: significantly decreased   General Gait Details: Pt reports LLE shorter than R, utilized tennis shoes with heel lift in L  Stairs            Wheelchair Mobility    Modified Rankin (Stroke Patients Only)       Balance Overall balance assessment: Needs assistance Sitting-balance support: Feet supported;Bilateral upper extremity supported Sitting balance-Leahy Scale: Good     Standing balance support: Bilateral upper extremity supported;During functional activity Standing balance-Leahy Scale: Good Standing balance comment: BUE support on RW when standing                             Pertinent Vitals/Pain Pain Assessment: 0-10 Pain Score: 4  Pain Location: anterior R thigh Pain Descriptors / Indicators: Aching;Discomfort Pain Intervention(s): Monitored during session;Premedicated before session    Home Living Family/patient expects to be discharged to:: Private residence Living Arrangements: Alone Available Help at Discharge: Personal care attendant;Available PRN/intermittently Type of Home: Independent living facility (apartment at the [facility]) Home Access: Level entry     Home Layout: One [place]: Kasandra Knudsen - single point;Walker - 2 wheels (rollator)      Prior Function Level of Independence: Independent with assistive device(s);Needs assistance   Gait / Transfers Assistance Needed: mod I with rollator, only  one fall in past 6 months  ADL's / Homemaking Assistance Needed: Counselling psychologist but has assistance for folding it, has house cleaner  Comments: personal care aide drives her to grocery store, makes bed, assists around house as needed 1x/week      Hand Dominance        Extremity/Trunk Assessment   Upper Extremity Assessment Upper Extremity Assessment: Generalized weakness    Lower Extremity Assessment Lower Extremity Assessment: Generalized weakness       Communication   Communication: No difficulties  Cognition Arousal/Alertness: Awake/alert Behavior During Therapy: WFL for tasks assessed/performed Overall Cognitive Status: Within Functional Limits for tasks assessed                                 General Comments: Very pleasant, sweet lady      General Comments General comments (skin integrity, edema, etc.): Pt able to recall 2/3 back precautions at end of session    Exercises     Assessment/Plan    PT Assessment Patient needs continued PT services  PT Problem List Decreased strength;Decreased mobility;Decreased knowledge of precautions;Decreased activity tolerance;Decreased balance       PT Treatment Interventions DME instruction;Therapeutic exercise;Balance training;Gait training;Neuromuscular re-education;Functional mobility training;Therapeutic activities;Patient/family education    PT Goals (Current goals can be found in the Care Plan section)  Acute Rehab PT Goals Patient Stated Goal: return to PLOF PT Goal Formulation: With patient Time For Goal Achievement: 11/30/20 Potential to Achieve Goals: Good    Frequency 7X/week   Barriers to discharge Decreased caregiver support lives alone    Co-evaluation               AM-PAC PT "6 Clicks" Mobility  Outcome Measure Help needed turning from your back to your side while in a flat bed without using bedrails?: A Little Help needed moving from lying on your back to sitting on the side of a flat bed without using bedrails?: A Little Help needed moving to and from a bed to a chair (including a wheelchair)?: A Little Help needed standing up from a chair using your arms (e.g., wheelchair or bedside chair)?: A Little Help needed  to walk in hospital room?: A Little Help needed climbing 3-5 steps with a railing? : A Little 6 Click Score: 18    End of Session Equipment Utilized During Treatment: Gait belt Activity Tolerance: Patient tolerated treatment well Patient left: in chair;with call bell/phone within reach;with chair alarm set Nurse Communication: Mobility status PT Visit Diagnosis: Muscle weakness (generalized) (M62.81);Unsteadiness on feet (R26.81)    Time: 5597-4163 PT Time Calculation (min) (ACUTE ONLY): 28 min   Charges:   PT Evaluation $PT Eval Low Complexity: 1 Low PT Treatments $Therapeutic Activity: 8-22 mins        Lavone Nian, PT, DPT 11/16/20, 10:29 AM   Waunita Schooner 11/16/2020, 10:28 AM

## 2020-11-17 DIAGNOSIS — I495 Sick sinus syndrome: Secondary | ICD-10-CM | POA: Diagnosis not present

## 2020-11-17 DIAGNOSIS — S32000D Wedge compression fracture of unspecified lumbar vertebra, subsequent encounter for fracture with routine healing: Secondary | ICD-10-CM | POA: Diagnosis not present

## 2020-11-17 DIAGNOSIS — I2510 Atherosclerotic heart disease of native coronary artery without angina pectoris: Secondary | ICD-10-CM | POA: Diagnosis not present

## 2020-11-17 DIAGNOSIS — L292 Pruritus vulvae: Secondary | ICD-10-CM | POA: Diagnosis not present

## 2020-11-17 DIAGNOSIS — E559 Vitamin D deficiency, unspecified: Secondary | ICD-10-CM | POA: Diagnosis not present

## 2020-11-17 DIAGNOSIS — N952 Postmenopausal atrophic vaginitis: Secondary | ICD-10-CM | POA: Diagnosis not present

## 2020-11-17 DIAGNOSIS — R2689 Other abnormalities of gait and mobility: Secondary | ICD-10-CM | POA: Diagnosis not present

## 2020-11-17 DIAGNOSIS — E876 Hypokalemia: Secondary | ICD-10-CM | POA: Diagnosis not present

## 2020-11-17 DIAGNOSIS — M2550 Pain in unspecified joint: Secondary | ICD-10-CM | POA: Diagnosis not present

## 2020-11-17 DIAGNOSIS — E782 Mixed hyperlipidemia: Secondary | ICD-10-CM | POA: Diagnosis not present

## 2020-11-17 DIAGNOSIS — S3210XD Unspecified fracture of sacrum, subsequent encounter for fracture with routine healing: Secondary | ICD-10-CM | POA: Diagnosis not present

## 2020-11-17 DIAGNOSIS — I272 Pulmonary hypertension, unspecified: Secondary | ICD-10-CM | POA: Diagnosis not present

## 2020-11-17 DIAGNOSIS — Z7401 Bed confinement status: Secondary | ICD-10-CM | POA: Diagnosis not present

## 2020-11-17 DIAGNOSIS — D649 Anemia, unspecified: Secondary | ICD-10-CM | POA: Diagnosis not present

## 2020-11-17 DIAGNOSIS — E119 Type 2 diabetes mellitus without complications: Secondary | ICD-10-CM | POA: Diagnosis not present

## 2020-11-17 DIAGNOSIS — I340 Nonrheumatic mitral (valve) insufficiency: Secondary | ICD-10-CM | POA: Diagnosis not present

## 2020-11-17 DIAGNOSIS — Z7901 Long term (current) use of anticoagulants: Secondary | ICD-10-CM | POA: Diagnosis not present

## 2020-11-17 DIAGNOSIS — Z8542 Personal history of malignant neoplasm of other parts of uterus: Secondary | ICD-10-CM | POA: Diagnosis not present

## 2020-11-17 DIAGNOSIS — R41841 Cognitive communication deficit: Secondary | ICD-10-CM | POA: Diagnosis not present

## 2020-11-17 DIAGNOSIS — Z87891 Personal history of nicotine dependence: Secondary | ICD-10-CM | POA: Diagnosis not present

## 2020-11-17 DIAGNOSIS — E1140 Type 2 diabetes mellitus with diabetic neuropathy, unspecified: Secondary | ICD-10-CM | POA: Diagnosis not present

## 2020-11-17 DIAGNOSIS — S32020A Wedge compression fracture of second lumbar vertebra, initial encounter for closed fracture: Secondary | ICD-10-CM | POA: Diagnosis not present

## 2020-11-17 DIAGNOSIS — I10 Essential (primary) hypertension: Secondary | ICD-10-CM | POA: Diagnosis not present

## 2020-11-17 DIAGNOSIS — I4819 Other persistent atrial fibrillation: Secondary | ICD-10-CM | POA: Diagnosis not present

## 2020-11-17 DIAGNOSIS — M8000XD Age-related osteoporosis with current pathological fracture, unspecified site, subsequent encounter for fracture with routine healing: Secondary | ICD-10-CM | POA: Diagnosis not present

## 2020-11-17 DIAGNOSIS — M6281 Muscle weakness (generalized): Secondary | ICD-10-CM | POA: Diagnosis not present

## 2020-11-17 DIAGNOSIS — I959 Hypotension, unspecified: Secondary | ICD-10-CM | POA: Diagnosis not present

## 2020-11-17 DIAGNOSIS — Z950 Presence of cardiac pacemaker: Secondary | ICD-10-CM | POA: Diagnosis not present

## 2020-11-17 DIAGNOSIS — I071 Rheumatic tricuspid insufficiency: Secondary | ICD-10-CM | POA: Diagnosis not present

## 2020-11-17 DIAGNOSIS — Z9181 History of falling: Secondary | ICD-10-CM | POA: Diagnosis not present

## 2020-11-17 DIAGNOSIS — I739 Peripheral vascular disease, unspecified: Secondary | ICD-10-CM | POA: Diagnosis not present

## 2020-11-17 LAB — GLUCOSE, CAPILLARY: Glucose-Capillary: 149 mg/dL — ABNORMAL HIGH (ref 70–99)

## 2020-11-17 MED ORDER — MELOXICAM 7.5 MG PO TABS: 7.5000 mg | ORAL_TABLET | Freq: Every day | ORAL | Status: DC | PRN

## 2020-11-17 MED ORDER — METOPROLOL SUCCINATE ER 25 MG PO TB24: 25.0000 mg | ORAL_TABLET | Freq: Two times a day (BID) | ORAL | Status: DC

## 2020-11-17 MED ORDER — OXYCODONE HCL 5 MG PO TABS: 2.5000 mg | ORAL_TABLET | Freq: Three times a day (TID) | ORAL | 0 refills | Status: AC | PRN

## 2020-11-17 NOTE — TOC Transition Note (Addendum)
Transition of Care [facility]) - CM/SW Discharge Note   Patient Details  Name: Kara [name] MRN: [id number] Date of Birth: 06/14/1922  [facility] CM/SW Contact:  Izola Price, RN Phone Number: 11/17/2020, 10:13 AM   Clinical Narrative:  11/17/20 Patient to be discharged and transferred to [facility]/SNF. Confirmed with Almyra Free at nursing unit [id number]. Notified Daughter Marvia Pickles via phone at [phone] who is in [place]. She had not further questions or concerns. Communicated with provider and unit RN of facility readiness. Simmie Davies RN CM [phone].   Disposition Destination. Room [address] at [place], [place], [place] [address] EMS cannot transport rollator and one of patient's bag of belongings. EMS left voice mail with sitter who has brought those items to hospital. Also let facility know this. DNR forms and scripts/ DC Summary/FL2 included in transport packet. Simmie Davies RN CM   Final next level of care: Skilled Nursing Facility Barriers to Discharge: Barriers Resolved   Patient Goals and CMS Choice        Discharge Placement              Patient chooses bed at:  [facility] level of care, confirmed wtih Almyra Free at [phone])   Name of family member notified: Marvia Pickles (Daughter) [phone] In [place]. (Spoke with daughter by phone to inform of imminent transfer to [facility]. Daughter was aware and had no further questions.) Patient and family notified of of transfer: 11/17/20  Discharge Plan and Services                                     Social Determinants of Health (SDOH) Interventions     Readmission Risk Interventions No flowsheet data found.

## 2020-11-17 NOTE — Progress Notes (Signed)
Patient will transport to [facility] today via stretcher. Called facility, [phone], and report given to nurse [facility]. Patient will admit to room 354.

## 2020-11-20 DIAGNOSIS — E1140 Type 2 diabetes mellitus with diabetic neuropathy, unspecified: Secondary | ICD-10-CM | POA: Diagnosis not present

## 2020-11-20 DIAGNOSIS — I4819 Other persistent atrial fibrillation: Secondary | ICD-10-CM | POA: Diagnosis not present

## 2020-11-20 DIAGNOSIS — M8000XD Age-related osteoporosis with current pathological fracture, unspecified site, subsequent encounter for fracture with routine healing: Secondary | ICD-10-CM | POA: Diagnosis not present

## 2020-11-20 LAB — SURGICAL PATHOLOGY

## 2020-11-21 ENCOUNTER — Telehealth: Payer: Medicare Other

## 2020-11-21 ENCOUNTER — Telehealth: Payer: Self-pay | Admitting: *Deleted

## 2020-11-21 NOTE — Telephone Encounter (Signed)
  Care Management   Follow Up Note   11/21/2020 Name: Kara [name] MRN: [id number] DOB: 1922/06/30   Referred by: Crecencio Mc, MD Reason for referral : Chronic Care Management (DM, Falls)   Upon chart review patient discharged from hospital to short term skilled rehab at the [facility] [place] on 11/17/20 after undergoing kyphoplasty. CCM outreach will be rescheduled for when patient is discharged back home.  Follow Up Plan: The care management team will reach out to the patient again over the next 60 business days if discharged from [place].   Hubert Azure RN, MSN RN Care Management Coordinator [place] [phone] [email or web address]

## 2020-11-30 DIAGNOSIS — S32020A Wedge compression fracture of second lumbar vertebra, initial encounter for closed fracture: Secondary | ICD-10-CM | POA: Diagnosis not present

## 2020-12-03 ENCOUNTER — Other Ambulatory Visit (INDEPENDENT_AMBULATORY_CARE_PROVIDER_SITE_OTHER): Payer: Self-pay | Admitting: Vascular Surgery

## 2020-12-03 DIAGNOSIS — I739 Peripheral vascular disease, unspecified: Secondary | ICD-10-CM

## 2020-12-04 ENCOUNTER — Encounter (INDEPENDENT_AMBULATORY_CARE_PROVIDER_SITE_OTHER): Payer: Medicare Other

## 2020-12-04 ENCOUNTER — Encounter (INDEPENDENT_AMBULATORY_CARE_PROVIDER_SITE_OTHER): Payer: Self-pay | Admitting: Vascular Surgery

## 2020-12-04 ENCOUNTER — Ambulatory Visit (INDEPENDENT_AMBULATORY_CARE_PROVIDER_SITE_OTHER): Payer: BLUE CROSS/BLUE SHIELD | Admitting: Vascular Surgery

## 2020-12-10 DIAGNOSIS — Z950 Presence of cardiac pacemaker: Secondary | ICD-10-CM | POA: Diagnosis not present

## 2020-12-10 DIAGNOSIS — R41841 Cognitive communication deficit: Secondary | ICD-10-CM | POA: Diagnosis not present

## 2020-12-10 DIAGNOSIS — Z7901 Long term (current) use of anticoagulants: Secondary | ICD-10-CM | POA: Diagnosis not present

## 2020-12-10 DIAGNOSIS — I4819 Other persistent atrial fibrillation: Secondary | ICD-10-CM | POA: Diagnosis not present

## 2020-12-10 DIAGNOSIS — R2689 Other abnormalities of gait and mobility: Secondary | ICD-10-CM | POA: Diagnosis not present

## 2020-12-10 DIAGNOSIS — S06369S Traumatic hemorrhage of cerebrum, unspecified, with loss of consciousness of unspecified duration, sequela: Secondary | ICD-10-CM | POA: Diagnosis not present

## 2020-12-10 DIAGNOSIS — E782 Mixed hyperlipidemia: Secondary | ICD-10-CM | POA: Diagnosis not present

## 2020-12-10 DIAGNOSIS — I10 Essential (primary) hypertension: Secondary | ICD-10-CM | POA: Diagnosis not present

## 2020-12-10 DIAGNOSIS — L292 Pruritus vulvae: Secondary | ICD-10-CM | POA: Diagnosis not present

## 2020-12-10 DIAGNOSIS — I739 Peripheral vascular disease, unspecified: Secondary | ICD-10-CM | POA: Diagnosis not present

## 2020-12-10 DIAGNOSIS — M6281 Muscle weakness (generalized): Secondary | ICD-10-CM | POA: Diagnosis not present

## 2020-12-10 DIAGNOSIS — I495 Sick sinus syndrome: Secondary | ICD-10-CM | POA: Diagnosis not present

## 2020-12-10 DIAGNOSIS — R262 Difficulty in walking, not elsewhere classified: Secondary | ICD-10-CM | POA: Diagnosis not present

## 2020-12-10 DIAGNOSIS — S62357S Nondisplaced fracture of shaft of fifth metacarpal bone, left hand, sequela: Secondary | ICD-10-CM | POA: Diagnosis not present

## 2020-12-10 DIAGNOSIS — I2510 Atherosclerotic heart disease of native coronary artery without angina pectoris: Secondary | ICD-10-CM | POA: Diagnosis not present

## 2020-12-10 DIAGNOSIS — S32020S Wedge compression fracture of second lumbar vertebra, sequela: Secondary | ICD-10-CM | POA: Diagnosis not present

## 2020-12-10 DIAGNOSIS — Z9181 History of falling: Secondary | ICD-10-CM | POA: Diagnosis not present

## 2020-12-10 DIAGNOSIS — I071 Rheumatic tricuspid insufficiency: Secondary | ICD-10-CM | POA: Diagnosis not present

## 2020-12-10 DIAGNOSIS — I340 Nonrheumatic mitral (valve) insufficiency: Secondary | ICD-10-CM | POA: Diagnosis not present

## 2020-12-10 DIAGNOSIS — Z8542 Personal history of malignant neoplasm of other parts of uterus: Secondary | ICD-10-CM | POA: Diagnosis not present

## 2020-12-10 DIAGNOSIS — E559 Vitamin D deficiency, unspecified: Secondary | ICD-10-CM | POA: Diagnosis not present

## 2020-12-11 DIAGNOSIS — R41841 Cognitive communication deficit: Secondary | ICD-10-CM | POA: Diagnosis not present

## 2020-12-11 DIAGNOSIS — M6281 Muscle weakness (generalized): Secondary | ICD-10-CM | POA: Diagnosis not present

## 2020-12-11 DIAGNOSIS — S06369S Traumatic hemorrhage of cerebrum, unspecified, with loss of consciousness of unspecified duration, sequela: Secondary | ICD-10-CM | POA: Diagnosis not present

## 2020-12-11 DIAGNOSIS — S32020S Wedge compression fracture of second lumbar vertebra, sequela: Secondary | ICD-10-CM | POA: Diagnosis not present

## 2020-12-11 DIAGNOSIS — R2689 Other abnormalities of gait and mobility: Secondary | ICD-10-CM | POA: Diagnosis not present

## 2020-12-11 DIAGNOSIS — S62357S Nondisplaced fracture of shaft of fifth metacarpal bone, left hand, sequela: Secondary | ICD-10-CM | POA: Diagnosis not present

## 2020-12-17 DIAGNOSIS — M5136 Other intervertebral disc degeneration, lumbar region: Secondary | ICD-10-CM | POA: Diagnosis not present

## 2020-12-17 DIAGNOSIS — M48061 Spinal stenosis, lumbar region without neurogenic claudication: Secondary | ICD-10-CM | POA: Diagnosis not present

## 2020-12-17 DIAGNOSIS — M5416 Radiculopathy, lumbar region: Secondary | ICD-10-CM | POA: Diagnosis not present

## 2020-12-24 ENCOUNTER — Ambulatory Visit (INDEPENDENT_AMBULATORY_CARE_PROVIDER_SITE_OTHER): Payer: Medicare Other | Admitting: *Deleted

## 2020-12-24 DIAGNOSIS — M545 Low back pain, unspecified: Secondary | ICD-10-CM

## 2020-12-24 DIAGNOSIS — I10 Essential (primary) hypertension: Secondary | ICD-10-CM

## 2020-12-24 DIAGNOSIS — Z9181 History of falling: Secondary | ICD-10-CM

## 2020-12-24 DIAGNOSIS — E1140 Type 2 diabetes mellitus with diabetic neuropathy, unspecified: Secondary | ICD-10-CM

## 2020-12-24 NOTE — Patient Instructions (Signed)
Visit Information  PATIENT GOALS:  Goals Addressed             This Visit's Progress    (RNCM) Manage My Blood Pressure & Diabetes   Not on track    Timeframe:  Long-Range Goal Priority:  Medium Start Date:  07/23/20                           Expected End Date:   05/17/21                    Follow Up Date 01/09/21    Consider checking blood pressures at least once a month Ask nursing staff to check your blood pressure once or twice a month Low salt carbohydrate modified diabetic diet Limit intake of sweets and sugars   Why is this important?   You won't feel high blood pressure, but it can still hurt your blood vessels.  High blood pressure can cause heart or kidney problems. It can also cause a stroke.  Making lifestyle changes like losing a little weight or eating less salt will help.  Checking your blood pressure at home and at different times of the day can help to control blood pressure.  If the doctor prescribes medicine remember to take it the way the doctor ordered.  Call the office if you cannot afford the medicine or if there are questions about it.     Notes:        (RNCM) Prevent further Falls and Injury   On track    Timeframe:  Long-Range Goal Priority:  High Start Date:   07/23/20                          Expected End Date:  05/18/21                     Follow Up Date 01/09/21    Call the doctor if I am feeling too drowsy with arthritis medication Keep my cell phone with me always Make an emergency alert plan in case I fall Use a Rolator with all ambulation Continue to work with home health therapy Take time and move slowly; try not to rush Wear you life alert/call bell around your neck at all times Notify provider if back/hip/leg pain worsens   Why is this important?   Most falls happen when it is hard for you to walk safely. Your balance may be off because of an illness. You may have pain in your knees, hip or other joints.  You may be overly tired or  taking medicines that make you sleepy. You may not be able to see or hear clearly.  Falls can lead to broken bones, bruises or other injuries.  There are things you can do to help prevent falling.     Notes:          Patient verbalizes understanding of instructions provided today and agrees to view in [place].   The care management team will reach out to the patient again over the next 20 business days.   Hubert Azure RN, MSN RN Care Management Coordinator [place] [phone] [email or web address]

## 2020-12-24 NOTE — Chronic Care Management (AMB) (Signed)
Chronic Care Management   CCM RN Visit Note  12/24/2020 Name: Kara [name] MRN: [id number] DOB: August 21, 1922  Subjective: RABEKAH [name] is a 87 y.o. year old female who is a primary care patient of Derrel Nip, Aris Everts, MD. The care management team was consulted for assistance with disease management and care coordination needs.    Engaged with patient by telephone for follow up visit in response to provider referral for case management and/or care coordination services.   Consent to Services:  The patient was given information about Chronic Care Management services, agreed to services, and gave verbal consent prior to initiation of services.  Please see initial visit note for detailed documentation.   Patient agreed to services and verbal consent obtained.   Assessment: Review of patient past medical history, allergies, medications, health status, including review of consultants reports, laboratory and other test data, was performed as part of comprehensive evaluation and provision of chronic care management services.   SDOH (Social Determinants of Health) assessments and interventions performed:    CCM Care Plan  Allergies  Allergen Reactions   Baycol [Cerivastatin Sodium]    Cardizem [Diltiazem Hcl]     LOWER EXTREMITY EDEMA   Crestor [Rosuvastatin Calcium] Other (See Comments)    INTOLERANT   Dronedarone Other (See Comments)    Fatigue   Metoprolol Tartrate Other (See Comments)   Omeprazole Other (See Comments)   Pravachol     INTOLERANT   Statins Other (See Comments)   Vioxx [Rofecoxib]    Zocor [Simvastatin]     INTOLERANT    Outpatient Encounter Medications as of 12/24/2020  Medication Sig   acetaminophen (TYLENOL) 500 MG tablet Take 500 mg by mouth every 6 (six) hours as needed for mild pain or moderate pain.   cyanocobalamin (,VITAMIN B-12,) 1000 MCG/ML injection INJECT 1ML INTO THE MUSCLE EVERY 14 DAYS   diphenoxylate-atropine (LOMOTIL) 2.5-0.025 MG tablet Take 1 tablet  by mouth 4 (four) times daily as needed for diarrhea or loose stools.   estradiol (ESTRACE) 0.1 MG/GM vaginal cream Place 1 Applicatorful vaginally daily as needed (vaginal atrophy).   Infant Care Products [facility]) OINT Apply 1 application topically as needed.   lidocaine (LIDODERM) 5 % Place 1 patch onto the skin daily. Remove & Discard patch within 12 hours or as directed by MD   meloxicam (MOBIC) 7.5 MG tablet Take 1 tablet (7.5 mg total) by mouth daily as needed for pain.   metaxalone (SKELAXIN) 800 MG tablet Take 400 mg by mouth 3 (three) times daily as needed for muscle spasms.   metoprolol succinate (TOPROL-XL) 25 MG 24 hr tablet Take 1 tablet (25 mg total) by mouth 2 (two) times daily.   ondansetron (ZOFRAN) 4 MG tablet Take 1 tablet (4 mg total) by mouth every 6 (six) hours as needed for nausea.   triamcinolone (KENALOG) 0.1 % APPLY 1 APPLICATION TOPICALLY 2 TIMES DAILY TO VULVA UNTIL ITCHING RESOLVES THEN REDUCE USE TO TWICE WEEKLY.   No facility-administered encounter medications on file as of 12/24/2020.    Patient Active Problem List   Diagnosis Date Noted   Pressure injury of skin 11/14/2020   Intractable back pain 11/14/2020   Compression fx, lumbar spine, sequela 11/13/2020   Closed compression fracture of L2 lumbar vertebra, initial encounter ([place]) 11/08/2020   Lumbar spinal stenosis 11/08/2020   Left buttock pain 07/30/2020   Left hand pain 07/30/2020   History of recent fall 07/30/2020   Hospital discharge follow-up 07/22/2020   Intraparenchymal  hemorrhage of brain ([place]) 07/13/2020   Syncope and collapse 07/13/2020   Orbital fracture ([place]) 07/13/2020   Hypokalemia 07/13/2020   Atrial fibrillation, chronic ([place]) 07/13/2020   HTN (hypertension) 07/13/2020   DNR (do not resuscitate) 07/13/2020   S/P placement of cardiac pacemaker 07/03/2020   Myalgia due to statin 07/03/2020   Aortic atherosclerosis ([place]) 07/03/2020   Displaced intertrochanteric fracture of left  femur, sequela 06/25/2019   Age-related osteoporosis with current pathological fracture with routine healing 06/15/2019   Sciatica of right side 02/18/2019   Vaginitis and vulvovaginitis 12/26/2018   Educated about COVID-19 virus infection 10/12/2018   Edema of lower extremity 11/24/2017   Exertional dyspnea 06/16/2017   Venous insufficiency of both lower extremities 11/24/2016   Essential hypertension 11/11/2016   Peripheral artery disease ([place]) 11/11/2016   Chronic pulmonary hypertension ([place]) 10/20/2016   Leg swelling 06/17/2016   Type 2 diabetes mellitus with diabetic neuropathy, unspecified ([place]) 06/17/2016   Urinary incontinence, urge 12/27/2015   Atrophic vaginitis 09/26/2015   Severe tricuspid valve insufficiency [id number]   Umbilical hernia without obstruction and without gangrene 06/26/2015   Carpal tunnel syndrome of right wrist 05/29/2015   Chronic fatigue 05/01/2015   E. coli UTI 11/21/2014   Vaginal atrophy 11/21/2014   Urethral caruncle 11/21/2014   Mitral valve regurgitation 05/09/2014   Fatigue 03/09/2014   SSS (sick sinus syndrome) ([place]) 10/11/2013   Encounter for current long-term use of anticoagulants 10/06/2012   Osteoarthritis 03/08/2012   Screening for breast cancer 03/08/2012   Pernicious anemia 02/04/2012   History of colectomy 02/04/2012   Vitamin D deficiency 07/30/2011   Acquired thrombophilia ([place]) 06/13/2011   Coronary atherosclerosis due to calcified coronary lesion 06/13/2011   Chronic diarrhea 06/13/2011    Conditions to be addressed/monitored:HTN, DMII, and Falls  Care Plan : Hypertension (Adult)  Updates made by Leona Singleton, RN since 12/24/2020 12:00 AM     Problem: Hypertension/Diabetes   Priority: Medium     Long-Range Goal: Hypertension Monitored/ Report maintaining Hgb A1C of below 7   Start Date: 07/23/2020  Expected End Date: 07/16/2021  This Visit's Progress: Not on track  Recent Progress: Not on track  Priority: Medium   Note:   Objective:  Lab Results  Component Value Date   HGBA1C 7.2 (H) 11/14/2020  Last practice recorded BP readings:  BP Readings from Last 3 Encounters:  11/17/20 (!) 142/58  11/09/20 (!) 146/78  10/19/20 (!) 150/60  Current Barriers:  Knowledge Deficits related to basic understanding of hypertension pathophysiology and self care management as evidenced by patient stating she does not monitor blood pressures at home.  Re discussed monitoring blood pressures at least monthly and asking the nurses at the [facility] to help.  Does not monitor blood sugars.  States she does try to limit her carbohydrate intake.   Unable to perform ADLs independently Unable to perform IADLs independently Nurse Case Manager Clinical Goal(s):  Over the next 120 days, patient will verbalize understanding of plan for hypertension management and report maintaining Hgb A1C of below 7 Over the next 120 days, patient will demonstrate improved adherence to prescribed treatment plan for hypertension as evidenced by taking all medications as prescribed, monitoring and recording blood pressure as directed (monthly), adhering to low sodium/DASH diabetic carbohydrate modified diet Interventions:   Collaboration with Deborra Medina regarding development and update of comprehensive plan of care as evidenced by provider attestation and co-signature Inter-disciplinary care team collaboration (see longitudinal plan of care) Evaluation of  current treatment plan related to hypertension self management and patient's adherence to plan as established by provider. Provided education to patient re: stroke prevention, s/s of heart attack and stroke, DASH diet, complications of uncontrolled blood pressure Reviewed medications with patient and discussed importance of compliance Discussed plans with patient for ongoing care management follow up and provided patient with direct contact information for care management  team Advised patient, providing education and rationale, to monitor blood pressure monthly and record, calling PCP for findings outside established parameters.  Reviewed scheduled/upcoming provider appointments including: PCP 03/05/21 Encouraged patient to request blood pressure checks by nursing staff at [facility] last few blood pressure elevations at providers office and discussed need to monitor blood pressures at home to verify trend; discussed importance of blood pressure control Encouraged patient to use 2022 Calendar Booklet previously sent to help document blood pressures and keep track of appointments Reviewed current Hgb A1C and discussed ways to keep within goal Reviewed and encouraged carbohydrate modified low salt diabetic diet Encouraged to increase activity as tolerated Provided patient emotional support and empathy Patient Goals/Self-Care Activities:  Over the next 45 days, patient will: Consider checking blood pressures at least once a month Ask nursing staff to check your blood pressure once or twice a month Low salt carbohydrate modified diabetic diet Limit intake of sweets and sugars Follow Up Plan: The care management team will reach out to the patient again over the next 45 business days.       Care Plan : Fall Risk (Adult)  Updates made by Leona Singleton, RN since 12/24/2020 12:00 AM     Problem: Increase fall risk related to resent fall with orbital and facial  fractures   Priority: High     Long-Range Goal: Patient will report no more falls within the next 90 days   Start Date: 07/23/2020  Expected End Date: 05/17/2021  This Visit's Progress: On track  Recent Progress: On track  Priority: High  Note:   Current Barriers:  Knowledge Deficits related to fall precautions in patient with recent fall resulting in facial and orbital fractures and intracranial hemorrhage with right finger fracture.  Underwent L2 kyphoplasty/sacroplasty on 6/30/222 and  went to rehabiliation after procedure.  Has been home for over a week with hired home health aide assistance.  Still awaiting for home health physical therapy to start.  States she is abulating independently with [employer].  "Just going slow" Decreased adherence to prescribed treatment for fall prevention Unable to perform ADLs independently at this time, needing assistance Unable to perform IADLs independently at this time needing assistance Clinical Goal(s):  patient will demonstrate improved adherence to prescribed treatment plan for decreasing falls as evidenced by patient reporting no falls in the next 45 days and review of EMR patient will verbalize using fall risk reduction strategies discussed patient will not experience additional falls patient will work with home health therapy to address needs related to fall precautions and increasing strength Interventions:  Collaboration with Deborra Medina regarding development and update of comprehensive plan of care as evidenced by provider attestation and co-signature Inter-disciplinary care team collaboration (see longitudinal plan of care) Provided verbal education re: Potential causes of falls and Fall prevention strategies Reviewed medications and discussed potential side effects of medications such as dizziness and frequent urination Assessed patients knowledge of fall risk prevention  Reviewed scheduled/upcoming provider appointments including:  PCP 01/03/21 Assistive or adaptive device use encouraged, discussed and encouraged use of walker with all ambulation  Cognition assessed Fall prevention plan reviewed and updated Fear of falling, loss of independence and pain acknowledged Participation in rehabilitation therapy encouraged, encouraged to increase activity slowly as tolerated Encouraged to do range of motion exercises while sitting Discussed importance of social interaction and encouraged patient go out of apartment to common  areas at least 3 times a week Emotional support and empathy provided to patient Discussed pain management and taking medications as prescribed Reviewed and discussed signs and symptoms of infection and encouraged patient to monitor Patient Goals/Self-Care Deficits:   Call the doctor if I am feeling too drowsy with arthritis medication Keep my cell phone with me always Make an emergency alert plan in case I fall Use a Rolator with all ambulation Continue to work with home health therapy Take time and move slowly; try not to rush Wear you life alert/call bell around your neck at all times Notify provider if back/hip/leg pain worsens Follow Up Plan: RNCM will outreach to patient within the next 20 business days       Plan:The care management team will reach out to the patient again over the next 20 business  days.  Hubert Azure RN, MSN RN Care Management Coordinator [place] [phone] [email or web address]

## 2020-12-26 DIAGNOSIS — M545 Low back pain, unspecified: Secondary | ICD-10-CM | POA: Diagnosis not present

## 2020-12-26 DIAGNOSIS — M5459 Other low back pain: Secondary | ICD-10-CM | POA: Diagnosis not present

## 2020-12-26 DIAGNOSIS — M6281 Muscle weakness (generalized): Secondary | ICD-10-CM | POA: Diagnosis not present

## 2020-12-26 DIAGNOSIS — R2689 Other abnormalities of gait and mobility: Secondary | ICD-10-CM | POA: Diagnosis not present

## 2020-12-26 DIAGNOSIS — R2681 Unsteadiness on feet: Secondary | ICD-10-CM | POA: Diagnosis not present

## 2020-12-31 ENCOUNTER — Ambulatory Visit: Payer: Medicare Other | Admitting: Internal Medicine

## 2020-12-31 DIAGNOSIS — R2681 Unsteadiness on feet: Secondary | ICD-10-CM | POA: Diagnosis not present

## 2020-12-31 DIAGNOSIS — M545 Low back pain, unspecified: Secondary | ICD-10-CM | POA: Diagnosis not present

## 2020-12-31 DIAGNOSIS — M5459 Other low back pain: Secondary | ICD-10-CM | POA: Diagnosis not present

## 2020-12-31 DIAGNOSIS — R2689 Other abnormalities of gait and mobility: Secondary | ICD-10-CM | POA: Diagnosis not present

## 2020-12-31 DIAGNOSIS — M6281 Muscle weakness (generalized): Secondary | ICD-10-CM | POA: Diagnosis not present

## 2021-01-02 DIAGNOSIS — M545 Low back pain, unspecified: Secondary | ICD-10-CM | POA: Diagnosis not present

## 2021-01-02 DIAGNOSIS — M6281 Muscle weakness (generalized): Secondary | ICD-10-CM | POA: Diagnosis not present

## 2021-01-02 DIAGNOSIS — R2681 Unsteadiness on feet: Secondary | ICD-10-CM | POA: Diagnosis not present

## 2021-01-02 DIAGNOSIS — R2689 Other abnormalities of gait and mobility: Secondary | ICD-10-CM | POA: Diagnosis not present

## 2021-01-02 DIAGNOSIS — M5459 Other low back pain: Secondary | ICD-10-CM | POA: Diagnosis not present

## 2021-01-03 ENCOUNTER — Ambulatory Visit (INDEPENDENT_AMBULATORY_CARE_PROVIDER_SITE_OTHER): Payer: Medicare Other | Admitting: Internal Medicine

## 2021-01-03 ENCOUNTER — Other Ambulatory Visit: Payer: Self-pay

## 2021-01-03 ENCOUNTER — Encounter: Payer: Self-pay | Admitting: Internal Medicine

## 2021-01-03 VITALS — BP 142/90 | HR 95 | Temp 96.8°F | Resp 16 | Ht 62.0 in | Wt 120.4 lb

## 2021-01-03 DIAGNOSIS — M8000XD Age-related osteoporosis with current pathological fracture, unspecified site, subsequent encounter for fracture with routine healing: Secondary | ICD-10-CM

## 2021-01-03 DIAGNOSIS — I2584 Coronary atherosclerosis due to calcified coronary lesion: Secondary | ICD-10-CM | POA: Diagnosis not present

## 2021-01-03 DIAGNOSIS — E211 Secondary hyperparathyroidism, not elsewhere classified: Secondary | ICD-10-CM | POA: Diagnosis not present

## 2021-01-03 DIAGNOSIS — E559 Vitamin D deficiency, unspecified: Secondary | ICD-10-CM | POA: Diagnosis not present

## 2021-01-03 DIAGNOSIS — I2510 Atherosclerotic heart disease of native coronary artery without angina pectoris: Secondary | ICD-10-CM

## 2021-01-03 DIAGNOSIS — E1140 Type 2 diabetes mellitus with diabetic neuropathy, unspecified: Secondary | ICD-10-CM | POA: Diagnosis not present

## 2021-01-03 DIAGNOSIS — Z66 Do not resuscitate: Secondary | ICD-10-CM | POA: Diagnosis not present

## 2021-01-03 NOTE — Patient Instructions (Addendum)
Here is the pain medicine regimen You should be taking  EVERY DAY:   1000 mg tylenol (OTC) every 12 hours and 7.5 mg meloxicam once daily (at dinner)   IF THIS DOES NOT CONTROL YOUR PAIN:  Add a tramadol not more than every 6 hour if needed for moderate pain  If YOU ARE STILL IN PAIN AFTER ADDING TRAMADOL:  Add one acetaminophen  with codeine.    THESE PRESCRIBED MEDICINES MAY BE THE CAUSE OF YOUR MORE SOLID STOOLS BECAUSE THEY CAUSE CONSTIPATION   FOR THE DIARRHEA:  LIMIT THE PRESCRIBED  ANTIDIARRHEAL MEDICINE TO 2-3 TIMES DAILY USE THE OTC ANTIDIARRHEAL UP TO 4 CAPLETS DAILY (IMODIUM)  SAVE THE MUSCLE RELAXER FOR A CRAMP IN THE BACK MUSCLE   USE PICKLE JUICE, MUSTARD FOR LEG CRAMPS THAT RESOLVE QUICKLY

## 2021-01-03 NOTE — Progress Notes (Signed)
Subjective:  Patient ID: Kara [name], female    DOB: 1923/03/02  Age: 87 y.o. MRN: [id number]  CC: The primary encounter diagnosis was Type 2 diabetes mellitus with diabetic neuropathy, without long-term current use of insulin ([place]). Diagnoses of Hyperparathyroidism due to vitamin D deficiency ([place]), Vitamin D deficiency, Age-related osteoporosis with current pathological fracture with routine healing, and DNR (do not resuscitate) were also pertinent to this visit.  HPI Kara [name] presents for  follow up chronic issues.  This visit occurred during the SARS-CoV-2 public health emergency.  Safety protocols were in place, including screening questions prior to the visit, additional usage of staff PPE, and extensive cleaning of exam room while observing appropriate contact time as indicated for disinfecting solutions.   She has been recovering from  acute back pain following a wedge compression fracture of the L2 vertebrae that occurred  in early June that was treated with kyphoplasty on July 1 by Dr Menz/  Her  pain currently is improved and radiates to left knee intermittently. Did not tolerate gabapentin.  Using  bread and butter pickle juice to manage cramps   as well as mustard  However, she is confused about her pain medication .  Currently taking meloxicam and tylenol.  Has tylenol #3, Tramadol and oxycodone bottles.  Not sure whether she took any in the last day or two.  Stools which have been chronically loose  since her remote bowel resection (managed with lomitil )have been more formed since home from hospital ; states that she  had 6 formed small caliber stools since midnight.  Uses lomotil and imodium for diarrhea.   Currently experiencing "disomfort" in her back with household chores.  Has a new recliner and a electric bed  T2DM: was treated with insulin during hospitalization for a1c 7.2 .  Records from hospitalization, all CBG s < 150      Outpatient Medications Prior to  Visit  Medication Sig Dispense Refill   acetaminophen (TYLENOL) 500 MG tablet Take 500 mg by mouth every 6 (six) hours as needed for mild pain or moderate pain.     cyanocobalamin (,VITAMIN B-12,) 1000 MCG/ML injection INJECT 1ML INTO THE MUSCLE EVERY 14 DAYS 10 mL 0   diphenoxylate-atropine (LOMOTIL) 2.5-0.025 MG tablet Take 1 tablet by mouth 4 (four) times daily as needed for diarrhea or loose stools.     lidocaine (LIDODERM) 5 % Place 1 patch onto the skin daily. Remove & Discard patch within 12 hours or as directed by MD 30 patch 0   meloxicam (MOBIC) 7.5 MG tablet Take 1 tablet (7.5 mg total) by mouth daily as needed for pain.     metaxalone (SKELAXIN) 800 MG tablet Take 400 mg by mouth 3 (three) times daily as needed for muscle spasms.     metoprolol succinate (TOPROL-XL) 25 MG 24 hr tablet Take 1 tablet (25 mg total) by mouth 2 (two) times daily.     triamcinolone (KENALOG) 0.1 % APPLY 1 APPLICATION TOPICALLY 2 TIMES DAILY TO VULVA UNTIL ITCHING RESOLVES THEN REDUCE USE TO TWICE WEEKLY. 15 g 1   Infant Care Products ([facility]) OINT Apply 1 application topically as needed. (Patient not taking: Reported on 01/03/2021) 430 g 1   ondansetron (ZOFRAN) 4 MG tablet Take 1 tablet (4 mg total) by mouth every 6 (six) hours as needed for nausea. (Patient not taking: Reported on 01/03/2021) 20 tablet 0   traMADol (ULTRAM) 50 MG tablet Take by mouth. (Patient not taking: Reported  on 01/03/2021)     estradiol (ESTRACE) 0.1 MG/GM vaginal cream Place 1 Applicatorful vaginally daily as needed (vaginal atrophy). (Patient not taking: Reported on 01/03/2021)     oxyCODONE (OXY IR/ROXICODONE) 5 MG immediate release tablet Take by mouth. (Patient not taking: Reported on 01/03/2021)     No facility-administered medications prior to visit.    Review of Systems;  Patient denies headache, fevers, malaise, unintentional weight loss, skin rash, eye pain, sinus congestion and sinus pain, sore throat, dysphagia,   hemoptysis , cough, dyspnea, wheezing, chest pain, palpitations, orthopnea, edema, abdominal pain, nausea, melena, diarrhea, constipation, flank pain, dysuria, hematuria, urinary  Frequency, nocturia, numbness, tingling, seizures,  Focal weakness, Loss of consciousness,  Tremor, insomnia, depression, anxiety, and suicidal ideation.      Objective:  BP (!) 142/90 (BP Location: Left Arm, Patient Position: Sitting, Cuff Size: Normal)   Pulse 95   Temp (!) 96.8 F (36 C) (Temporal)   Resp 16   Ht '5\' 2"'$  (1.575 m)   Wt 120 lb 6.4 oz (54.6 kg)   SpO2 97%   BMI 22.02 kg/m   BP Readings from Last 3 Encounters:  01/03/21 (!) 142/90  11/17/20 (!) 142/58  11/09/20 (!) 146/78    Wt Readings from Last 3 Encounters:  01/03/21 120 lb 6.4 oz (54.6 kg)  11/15/20 121 lb 14.6 oz (55.3 kg)  11/09/20 122 lb (55.3 kg)    General appearance: alert, cooperative and appears stated age Ears: normal TM's and external ear canals both ears Throat: lips, mucosa, and tongue normal; teeth and gums normal Neck: no adenopathy, no carotid bruit, supple, symmetrical, trachea midline and thyroid not enlarged, symmetric, no tenderness/mass/nodules Back: symmetric, no curvature. ROM normal. No CVA tenderness. Lungs: clear to auscultation bilaterally Heart: regular rate and rhythm, S1, S2 normal, no murmur, click, rub or gallop Abdomen: soft, non-tender; bowel sounds normal; no masses,  no organomegaly Pulses: 2+ and symmetric Skin: Skin color, texture, turgor normal. No rashes or lesions Lymph nodes: Cervical, supraclavicular, and axillary nodes normal.  Lab Results  Component Value Date   HGBA1C 7.2 (H) 11/14/2020   HGBA1C 6.3 (H) 07/14/2020   HGBA1C 6.4 07/02/2020    Lab Results  Component Value Date   CREATININE 0.64 11/15/2020   CREATININE 0.70 11/14/2020   CREATININE 0.73 11/13/2020    Lab Results  Component Value Date   WBC 8.6 11/14/2020   HGB 13.7 11/14/2020   HCT 41.4 11/14/2020   PLT 176  11/14/2020   GLUCOSE 170 (H) 11/15/2020   CHOL 142 10/11/2013   TRIG 194.0 (H) 10/11/2013   HDL 43.10 10/11/2013   LDLDIRECT 86.3 06/30/2012   LDLCALC 60 10/11/2013   ALT 10 07/20/2020   AST 14 07/20/2020   NA 140 11/15/2020   K 3.9 11/15/2020   CL 101 11/15/2020   CREATININE 0.64 11/15/2020   BUN 19 11/15/2020   CO2 30 11/15/2020   TSH 3.15 12/29/2019   INR 1.1 07/13/2020   HGBA1C 7.2 (H) 11/14/2020   MICROALBUR <0.7 07/02/2020    NM Bone W/Spect  Result Date: 11/14/2020 CLINICAL DATA:  Severe back pain EXAM: NM BONE SCAN AND SPECT IMAGING TECHNIQUE: After intravenous injection of radiopharmaceutical, delayed planar images were obtained in multiple projections. Additionally, delayed triplanar SPECT images were obtained through the area of interest. RADIOPHARMACEUTICALS:  21.9 mCi Tc-47mMDP COMPARISON:  None. FINDINGS: There is a medium size focus of intense radiotracer uptake localizing to the posterior and superior aspect of the L2 vertebral  body. Here, there is a compression fracture with loss of approximately 25% of the vertebral body height as demonstrated on the CT from 11/06/2020. Asymmetric increased radiotracer uptake localizes to the nondisplaced left zone 1 sacral insufficiency fracture. Mild increased uptake at the L5-S1 level compatible with degenerative disc disease. Other: On the CT portion of the exam there is extensive aortic atherosclerosis. Calcified gallstones identified within the gallbladder. Midline infraumbilical ventral abdominal wall hernia contains fat only. IMPRESSION: 1. There is intense radiotracer uptake localizing to the superior endplate fracture deformity involving the L2 vertebral body compatible with acute to subacute fracture. 2. Mild asymmetric increased uptake localizes to the left sacral wing insufficiency fracture demonstrated on previous CT. Electronically Signed   By: Kerby Moors M.D.   On: 11/14/2020 15:44    Assessment & Plan:   Problem  List Items Addressed This Visit       Unprioritized   Age-related osteoporosis with current pathological fracture with routine healing    With recent L2 vertebral fracture and hip fracture, s/p kyphoplasty and hip pinning.  Given her age and life expectancy , osteoporosis therapy is not likely to prevent future fractures.       DNR (do not resuscitate)   Relevant Orders   DNR (Do Not Resuscitate)   Type 2 diabetes mellitus with diabetic neuropathy, unspecified ([place]) - Primary   Relevant Orders   Hemoglobin A1c   Comprehensive metabolic panel   Microalbumin / creatinine urine ratio   Other Visit Diagnoses     Hyperparathyroidism due to vitamin D deficiency ([place])       Vitamin D deficiency       Relevant Orders   VITAMIN D 25 Hydroxy (Vit-D Deficiency, Fractures)       I spent 30 mintutes dedicated to the care of this patient on the date of this encounter to include pre-visit review of his medical history,  Face-to-face time with the patient , and post visit ordering of testing and therapeutics.  Medications Discontinued During This Encounter  Medication Reason   oxyCODONE (OXY IR/ROXICODONE) 5 MG immediate release tablet    estradiol (ESTRACE) 0.1 MG/GM vaginal cream     Follow-up: No follow-ups on file.   Crecencio Mc, MD

## 2021-01-06 NOTE — Assessment & Plan Note (Signed)
With recent L2 vertebral fracture and hip fracture, s/p kyphoplasty and hip pinning.  Given her age and life expectancy , osteoporosis therapy is not likely to prevent future fractures.

## 2021-01-07 DIAGNOSIS — R2689 Other abnormalities of gait and mobility: Secondary | ICD-10-CM | POA: Diagnosis not present

## 2021-01-07 DIAGNOSIS — M545 Low back pain, unspecified: Secondary | ICD-10-CM | POA: Diagnosis not present

## 2021-01-07 DIAGNOSIS — M5459 Other low back pain: Secondary | ICD-10-CM | POA: Diagnosis not present

## 2021-01-07 DIAGNOSIS — R2681 Unsteadiness on feet: Secondary | ICD-10-CM | POA: Diagnosis not present

## 2021-01-07 DIAGNOSIS — M6281 Muscle weakness (generalized): Secondary | ICD-10-CM | POA: Diagnosis not present

## 2021-01-09 ENCOUNTER — Other Ambulatory Visit: Payer: Self-pay | Admitting: Internal Medicine

## 2021-01-09 ENCOUNTER — Ambulatory Visit: Payer: Medicare Other | Admitting: *Deleted

## 2021-01-09 DIAGNOSIS — Z9181 History of falling: Secondary | ICD-10-CM

## 2021-01-09 DIAGNOSIS — M6281 Muscle weakness (generalized): Secondary | ICD-10-CM | POA: Diagnosis not present

## 2021-01-09 DIAGNOSIS — R2689 Other abnormalities of gait and mobility: Secondary | ICD-10-CM | POA: Diagnosis not present

## 2021-01-09 DIAGNOSIS — I10 Essential (primary) hypertension: Secondary | ICD-10-CM

## 2021-01-09 DIAGNOSIS — R2681 Unsteadiness on feet: Secondary | ICD-10-CM | POA: Diagnosis not present

## 2021-01-09 DIAGNOSIS — M545 Low back pain, unspecified: Secondary | ICD-10-CM | POA: Diagnosis not present

## 2021-01-09 DIAGNOSIS — M8000XD Age-related osteoporosis with current pathological fracture, unspecified site, subsequent encounter for fracture with routine healing: Secondary | ICD-10-CM

## 2021-01-09 DIAGNOSIS — M5459 Other low back pain: Secondary | ICD-10-CM | POA: Diagnosis not present

## 2021-01-09 DIAGNOSIS — K529 Noninfective gastroenteritis and colitis, unspecified: Secondary | ICD-10-CM

## 2021-01-09 NOTE — Patient Instructions (Signed)
Visit Information  PATIENT GOALS:  Goals Addressed             This Visit's Progress    (RNCM) Manage My Blood Pressure & Diabetes   Not on track    Timeframe:  Long-Range Goal Priority:  Medium Start Date:  07/23/20                           Expected End Date:   05/17/21                    Follow Up Date 9/21//22    Consider checking blood pressures at least twice a week Ask nursing staff to check your blood pressure  twice a week Low salt carbohydrate modified diabetic diet Limit intake of sweets and sugars   Why is this important?   You won't feel high blood pressure, but it can still hurt your blood vessels.  High blood pressure can cause heart or kidney problems. It can also cause a stroke.  Making lifestyle changes like losing a little weight or eating less salt will help.  Checking your blood pressure at home and at different times of the day can help to control blood pressure.  If the doctor prescribes medicine remember to take it the way the doctor ordered.  Call the office if you cannot afford the medicine or if there are questions about it.     Notes:        (RNCM) Prevent further Falls and Injury   On track    Timeframe:  Long-Range Goal Priority:  High Start Date:   07/23/20                          Expected End Date:  05/18/21                     Follow Up Date 02/06/21    Keep my cell phone with me always Make an emergency alert plan in case I fall Use a Rolator with all ambulation Continue to work with home health therapy Take time and move slowly; try not to rush Wear you life alert/call bell around your neck at all times Notify provider if back/hip/leg pain worsens Consider taking pain medication prior to therapy sessions   Why is this important?   Most falls happen when it is hard for you to walk safely. Your balance may be off because of an illness. You may have pain in your knees, hip or other joints.  You may be overly tired or taking medicines  that make you sleepy. You may not be able to see or hear clearly.  Falls can lead to broken bones, bruises or other injuries.  There are things you can do to help prevent falling.     Notes:          Patient verbalizes understanding of instructions provided today and agrees to view in [place].   The care management team will reach out to the patient again over the next 30 business days.   Hubert Azure RN, MSN RN Care Management Coordinator [place] [phone] [email or web address]

## 2021-01-09 NOTE — Chronic Care Management (AMB) (Signed)
Chronic Care Management   CCM RN Visit Note  01/09/2021 Name: Kara [name] MRN: [id number] DOB: Aug 19, 1922  Subjective: Kara [name] is a 87 y.o. year old female who is a primary care patient of Kara [name], Kara Everts, MD. The care management team was consulted for assistance with disease management and care coordination needs.    Engaged with patient by telephone for follow up visit in response to provider referral for case management and/or care coordination services.   Consent to Services:  The patient was given information about Chronic Care Management services, agreed to services, and gave verbal consent prior to initiation of services.  Please see initial visit note for detailed documentation.   Patient agreed to services and verbal consent obtained.   Assessment: Review of patient past medical history, allergies, medications, health status, including review of consultants reports, laboratory and other test data, was performed as part of comprehensive evaluation and provision of chronic care management services.   SDOH (Social Determinants of Health) assessments and interventions performed:    CCM Care Plan  Allergies  Allergen Reactions   Baycol [Cerivastatin Sodium]    Cardizem [Diltiazem Hcl]     LOWER EXTREMITY EDEMA   Crestor [Rosuvastatin Calcium] Other (See Comments)    INTOLERANT   Dronedarone Other (See Comments)    Fatigue   Metoprolol Tartrate Other (See Comments)   Omeprazole Other (See Comments)   Pravachol     INTOLERANT   Statins Other (See Comments)   Vioxx [Rofecoxib]    Zocor [Simvastatin]     INTOLERANT    Outpatient Encounter Medications as of 01/09/2021  Medication Sig   diphenoxylate-atropine (LOMOTIL) 2.5-0.025 MG tablet Take 1 tablet by mouth 4 (four) times daily as needed for diarrhea or loose stools.   traMADol (ULTRAM) 50 MG tablet Take by mouth.   acetaminophen (TYLENOL) 500 MG tablet Take 500 mg by mouth every 6 (six) hours as needed for mild  pain or moderate pain.   cyanocobalamin (,VITAMIN B-12,) 1000 MCG/ML injection INJECT 1ML INTO THE MUSCLE EVERY 14 DAYS   Infant Care Products (DERMACLOUD) OINT Apply 1 application topically as needed. (Patient not taking: Reported on 01/03/2021)   lidocaine (LIDODERM) 5 % Place 1 patch onto the skin daily. Remove & Discard patch within 12 hours or as directed by MD   meloxicam (MOBIC) 7.5 MG tablet Take 1 tablet (7.5 mg total) by mouth daily as needed for pain.   metaxalone (SKELAXIN) 800 MG tablet Take 400 mg by mouth 3 (three) times daily as needed for muscle spasms.   metoprolol succinate (TOPROL-XL) 25 MG 24 hr tablet Take 1 tablet (25 mg total) by mouth 2 (two) times daily.   ondansetron (ZOFRAN) 4 MG tablet Take 1 tablet (4 mg total) by mouth every 6 (six) hours as needed for nausea. (Patient not taking: Reported on 01/03/2021)   triamcinolone (KENALOG) 0.1 % APPLY 1 APPLICATION TOPICALLY 2 TIMES DAILY TO VULVA UNTIL ITCHING RESOLVES THEN REDUCE USE TO TWICE WEEKLY.   No facility-administered encounter medications on file as of 01/09/2021.    Patient Active Problem List   Diagnosis Date Noted   Pressure injury of skin 11/14/2020   Intractable back pain 11/14/2020   Compression fx, lumbar spine, sequela 11/13/2020   Closed compression fracture of L2 lumbar vertebra, initial encounter ([place]) 11/08/2020   Lumbar spinal stenosis 11/08/2020   Closed intertrochanteric fracture of left femur ([place]) 08/22/2020   Contusion of lower back 08/22/2020   Left buttock pain 07/30/2020  Left hand pain 07/30/2020   History of recent fall 07/30/2020   Hospital discharge follow-up 07/22/2020   Intraparenchymal hemorrhage of brain (Kara [name]) 07/13/2020   Syncope and collapse 07/13/2020   Orbital fracture ([place]) 07/13/2020   Hypokalemia 07/13/2020   Atrial fibrillation, chronic (Kara [name]) 07/13/2020   HTN (hypertension) 07/13/2020   DNR (do not resuscitate) 07/13/2020   S/P placement of cardiac pacemaker  07/03/2020   Myalgia due to statin 07/03/2020   Aortic atherosclerosis (Kara [name]) 07/03/2020   Displaced intertrochanteric fracture of left femur, sequela 06/25/2019   Age-related osteoporosis with current pathological fracture with routine healing 06/15/2019   Sciatica of right side 02/18/2019   Vaginitis and vulvovaginitis 12/26/2018   Educated about COVID-19 virus infection 10/12/2018   Edema of lower extremity 11/24/2017   Exertional dyspnea 06/16/2017   Venous insufficiency of both lower extremities 11/24/2016   Essential hypertension 11/11/2016   Peripheral artery disease ([place]) 11/11/2016   Chronic pulmonary hypertension ([place]) 10/20/2016   Leg swelling 06/17/2016   Type 2 diabetes mellitus with diabetic neuropathy, unspecified (Kara [name]) 06/17/2016   Urinary incontinence, urge 12/27/2015   Atrophic vaginitis 09/26/2015   Severe tricuspid valve insufficiency 123456   Umbilical hernia without obstruction and without gangrene 06/26/2015   Carpal tunnel syndrome of right wrist 05/29/2015   Chronic fatigue 05/01/2015   E. coli UTI 11/21/2014   Vaginal atrophy 11/21/2014   Urethral caruncle 11/21/2014   Mitral valve regurgitation 05/09/2014   Fatigue 03/09/2014   SSS (sick sinus syndrome) (Kara [name]) 10/11/2013   Encounter for current long-term use of anticoagulants 10/06/2012   Osteoarthritis 03/08/2012   Screening for breast cancer 03/08/2012   Pernicious anemia 02/04/2012   History of colectomy 02/04/2012   Vitamin D deficiency 07/30/2011   Acquired thrombophilia (Kara [name]) 06/13/2011   Coronary atherosclerosis due to calcified coronary lesion 06/13/2011   Chronic diarrhea 06/13/2011    Conditions to be addressed/monitored:HTN and Falls  Care Plan : Hypertension (Adult)  Updates made by Leona Singleton, RN since 01/09/2021 12:00 AM     Problem: Hypertension/Diabetes   Priority: Medium     Long-Range Goal: Hypertension Monitored/ Report maintaining Hgb A1C of below 7   Start Date:  07/23/2020  Expected End Date: 07/16/2021  This Visit's Progress: Not on track  Recent Progress: Not on track  Priority: Medium  Note:   Objective:  Lab Results  Component Value Date   HGBA1C 7.2 (H) 11/14/2020  Last practice recorded BP readings:  BP Readings from Last 3 Encounters:  01/03/21 (!) 142/90  11/17/20 (!) 142/58  11/09/20 (!) 146/78  Current Barriers:  Knowledge Deficits related to basic understanding of hypertension pathophysiology and self care management as evidenced by patient stating she does not monitor blood pressures at home.  Re discussed monitoring blood pressures at home and asking the nurses at the [facility] to help.  Does not monitor blood sugars.  States she does try to limit her carbohydrate intake.   Unable to perform ADLs independently Unable to perform IADLs independently Nurse Case Manager Clinical Goal(s):  Over the next 120 days, patient will verbalize understanding of plan for hypertension management and report maintaining Hgb A1C of below 7 Over the next 120 days, patient will demonstrate improved adherence to prescribed treatment plan for hypertension as evidenced by taking all medications as prescribed, monitoring and recording blood pressure as directed (monthly), adhering to low sodium/DASH diabetic carbohydrate modified diet Interventions:   Collaboration with Deborra Medina regarding development and update of comprehensive plan of care  as evidenced by provider attestation and co-signature Inter-disciplinary care team collaboration (see longitudinal plan of care) Evaluation of current treatment plan related to hypertension self management and patient's adherence to plan as established by provider. Provided education to patient re: stroke prevention, s/s of heart attack and stroke, DASH diet, complications of uncontrolled blood pressure Reviewed medications with patient and discussed importance of compliance Discussed plans with  patient for ongoing care management follow up and provided patient with direct contact information for care management team Advised patient, providing education and rationale, to monitor blood pressure monthly and record, calling PCP for findings outside established parameters.  Reviewed scheduled/upcoming provider appointments including: PCP 03/05/21 Encouraged patient to request blood pressure checks by nursing staff at [facility] last few blood pressure elevations at providers office and discussed need to monitor blood pressures at home to verify trend; discussed importance of blood pressure control Encouraged patient to use 2022 Calendar Booklet previously sent to help document blood pressures and keep track of appointments Reviewed current Hgb A1C and discussed ways to keep within goal Reviewed and encouraged carbohydrate modified low salt diabetic diet Encouraged to increase activity as tolerated Provided patient emotional support and empathy Patient Goals/Self-Care Activities:  Over the next 45 days, patient will: Consider checking blood pressures at least twice a week Ask nursing staff to check your blood pressure  twice a week Low salt carbohydrate modified diabetic diet Limit intake of sweets and sugars Follow Up Plan: The care management team will reach out to the patient again over the next 45 business days.       Care Plan : Fall Risk (Adult)  Updates made by Leona Singleton, RN since 01/09/2021 12:00 AM     Problem: Increase fall risk related to resent fall with orbital and facial  fractures   Priority: High     Long-Range Goal: Patient will report no more falls within the next 90 days   Start Date: 07/23/2020  Expected End Date: 05/17/2021  This Visit's Progress: On track  Recent Progress: On track  Priority: High  Note:   Current Barriers:  Knowledge Deficits related to fall precautions in patient with recent fall resulting in facial and orbital fractures  and intracranial hemorrhage with right finger fracture.  Underwent L2 kyphoplasty/sacroplasty on 6/30/222; reports good days and bad days, feels like she may have overdid therapy the other day.  Managing pain with tylenol and Tramadol; denies any recent falls and continues with home therapy;  Does report increase in diarrhea after starting protein and milk smoothies, has since stopped smoothies and diarrhea is getting better Decreased adherence to prescribed treatment for fall prevention Unable to perform ADLs independently at this time, needing assistance Unable to perform IADLs independently at this time needing assistance Clinical Goal(s):  patient will demonstrate improved adherence to prescribed treatment plan for decreasing falls as evidenced by patient reporting no falls in the next 45 days and review of EMR patient will verbalize using fall risk reduction strategies discussed patient will not experience additional falls patient will work with home health therapy to address needs related to fall precautions and increasing strength Interventions:  Collaboration with Deborra Medina regarding development and update of comprehensive plan of care as evidenced by provider attestation and co-signature Inter-disciplinary care team collaboration (see longitudinal plan of care) Provided verbal education re: Potential causes of falls and Fall prevention strategies Reviewed medications and discussed potential side effects of medications such as dizziness and frequent urination Assessed patients knowledge of  fall risk prevention  Reviewed scheduled/upcoming provider appointments including:  PCP October 2022 Assistive or adaptive device use encouraged, discussed and encouraged use of walker with all ambulation Cognition assessed Fall prevention plan reviewed and updated Fear of falling, loss of independence and pain acknowledged Participation in rehabilitation therapy encouraged, encouraged to increase  activity slowly as tolerated Encouraged to do range of motion exercises while sitting Discussed importance of social interaction and encouraged patient go out of apartment to common areas at least 3 times a week Emotional support and empathy provided to patient Discussed pain management and taking medications as prescribed Reviewed and discussed signs and symptoms of infection and encouraged patient to monitor Patient Goals/Self-Care Deficits:   Keep my cell phone with me always Make an emergency alert plan in case I fall Use a Rolator with all ambulation Continue to work with home health therapy Take time and move slowly; try not to rush Wear you life alert/call bell around your neck at all times Notify provider if back/hip/leg pain worsens Consider taking pain medication prior to therapy sessions Follow Up Plan: RNCM will outreach to patient within the next 30 business days       Plan:The care management team will reach out to the patient again over the next 30 business days.  Hubert Azure RN, MSN RN Care Management Coordinator [place] [phone] [email or web address]

## 2021-01-10 NOTE — Telephone Encounter (Signed)
Refilled: 11/05/2020 Last OV: 01/03/2021 Next OV: 02/14/2021

## 2021-01-14 DIAGNOSIS — M545 Low back pain, unspecified: Secondary | ICD-10-CM | POA: Diagnosis not present

## 2021-01-14 DIAGNOSIS — R2689 Other abnormalities of gait and mobility: Secondary | ICD-10-CM | POA: Diagnosis not present

## 2021-01-14 DIAGNOSIS — M5459 Other low back pain: Secondary | ICD-10-CM | POA: Diagnosis not present

## 2021-01-14 DIAGNOSIS — M6281 Muscle weakness (generalized): Secondary | ICD-10-CM | POA: Diagnosis not present

## 2021-01-14 DIAGNOSIS — R2681 Unsteadiness on feet: Secondary | ICD-10-CM | POA: Diagnosis not present

## 2021-01-16 DIAGNOSIS — R2689 Other abnormalities of gait and mobility: Secondary | ICD-10-CM | POA: Diagnosis not present

## 2021-01-16 DIAGNOSIS — E1140 Type 2 diabetes mellitus with diabetic neuropathy, unspecified: Secondary | ICD-10-CM

## 2021-01-16 DIAGNOSIS — M5459 Other low back pain: Secondary | ICD-10-CM | POA: Diagnosis not present

## 2021-01-16 DIAGNOSIS — M545 Low back pain, unspecified: Secondary | ICD-10-CM | POA: Diagnosis not present

## 2021-01-16 DIAGNOSIS — I10 Essential (primary) hypertension: Secondary | ICD-10-CM | POA: Diagnosis not present

## 2021-01-16 DIAGNOSIS — M6281 Muscle weakness (generalized): Secondary | ICD-10-CM | POA: Diagnosis not present

## 2021-01-16 DIAGNOSIS — R2681 Unsteadiness on feet: Secondary | ICD-10-CM | POA: Diagnosis not present

## 2021-01-23 DIAGNOSIS — M545 Low back pain, unspecified: Secondary | ICD-10-CM | POA: Diagnosis not present

## 2021-01-23 DIAGNOSIS — R2689 Other abnormalities of gait and mobility: Secondary | ICD-10-CM | POA: Diagnosis not present

## 2021-01-23 DIAGNOSIS — R2681 Unsteadiness on feet: Secondary | ICD-10-CM | POA: Diagnosis not present

## 2021-01-23 DIAGNOSIS — M5459 Other low back pain: Secondary | ICD-10-CM | POA: Diagnosis not present

## 2021-01-23 DIAGNOSIS — M6281 Muscle weakness (generalized): Secondary | ICD-10-CM | POA: Diagnosis not present

## 2021-01-26 ENCOUNTER — Other Ambulatory Visit: Payer: Self-pay | Admitting: Internal Medicine

## 2021-01-28 DIAGNOSIS — M48061 Spinal stenosis, lumbar region without neurogenic claudication: Secondary | ICD-10-CM | POA: Diagnosis not present

## 2021-01-28 DIAGNOSIS — R2689 Other abnormalities of gait and mobility: Secondary | ICD-10-CM | POA: Diagnosis not present

## 2021-01-28 DIAGNOSIS — M545 Low back pain, unspecified: Secondary | ICD-10-CM | POA: Diagnosis not present

## 2021-01-28 DIAGNOSIS — M5136 Other intervertebral disc degeneration, lumbar region: Secondary | ICD-10-CM | POA: Diagnosis not present

## 2021-01-28 DIAGNOSIS — M5416 Radiculopathy, lumbar region: Secondary | ICD-10-CM | POA: Diagnosis not present

## 2021-01-28 DIAGNOSIS — R2681 Unsteadiness on feet: Secondary | ICD-10-CM | POA: Diagnosis not present

## 2021-01-28 DIAGNOSIS — M6281 Muscle weakness (generalized): Secondary | ICD-10-CM | POA: Diagnosis not present

## 2021-01-28 DIAGNOSIS — M5459 Other low back pain: Secondary | ICD-10-CM | POA: Diagnosis not present

## 2021-01-30 DIAGNOSIS — R2681 Unsteadiness on feet: Secondary | ICD-10-CM | POA: Diagnosis not present

## 2021-01-30 DIAGNOSIS — M545 Low back pain, unspecified: Secondary | ICD-10-CM | POA: Diagnosis not present

## 2021-01-30 DIAGNOSIS — M6281 Muscle weakness (generalized): Secondary | ICD-10-CM | POA: Diagnosis not present

## 2021-01-30 DIAGNOSIS — M5459 Other low back pain: Secondary | ICD-10-CM | POA: Diagnosis not present

## 2021-01-30 DIAGNOSIS — R2689 Other abnormalities of gait and mobility: Secondary | ICD-10-CM | POA: Diagnosis not present

## 2021-02-04 DIAGNOSIS — R2681 Unsteadiness on feet: Secondary | ICD-10-CM | POA: Diagnosis not present

## 2021-02-04 DIAGNOSIS — M545 Low back pain, unspecified: Secondary | ICD-10-CM | POA: Diagnosis not present

## 2021-02-04 DIAGNOSIS — R2689 Other abnormalities of gait and mobility: Secondary | ICD-10-CM | POA: Diagnosis not present

## 2021-02-04 DIAGNOSIS — M6281 Muscle weakness (generalized): Secondary | ICD-10-CM | POA: Diagnosis not present

## 2021-02-04 DIAGNOSIS — M5459 Other low back pain: Secondary | ICD-10-CM | POA: Diagnosis not present

## 2021-02-06 ENCOUNTER — Ambulatory Visit (INDEPENDENT_AMBULATORY_CARE_PROVIDER_SITE_OTHER): Payer: Medicare Other | Admitting: *Deleted

## 2021-02-06 DIAGNOSIS — R2681 Unsteadiness on feet: Secondary | ICD-10-CM | POA: Diagnosis not present

## 2021-02-06 DIAGNOSIS — R5382 Chronic fatigue, unspecified: Secondary | ICD-10-CM

## 2021-02-06 DIAGNOSIS — M545 Low back pain, unspecified: Secondary | ICD-10-CM | POA: Diagnosis not present

## 2021-02-06 DIAGNOSIS — M6281 Muscle weakness (generalized): Secondary | ICD-10-CM | POA: Diagnosis not present

## 2021-02-06 DIAGNOSIS — M5459 Other low back pain: Secondary | ICD-10-CM | POA: Diagnosis not present

## 2021-02-06 DIAGNOSIS — R2689 Other abnormalities of gait and mobility: Secondary | ICD-10-CM | POA: Diagnosis not present

## 2021-02-06 DIAGNOSIS — I10 Essential (primary) hypertension: Secondary | ICD-10-CM

## 2021-02-06 NOTE — Patient Instructions (Signed)
Visit Information  PATIENT GOALS:  Goals Addressed             This Visit's Progress    (RNCM) Manage My Blood Pressure & Diabetes   Not on track    Timeframe:  Long-Range Goal Priority:  Medium Start Date:  07/23/20                           Expected End Date:   05/17/21                    Follow Up Date 03/13/21    Consider checking blood pressures at least twice a week Ask nursing staff to check your blood pressure  twice a week Low salt carbohydrate modified diabetic diet Limit intake of sweets and sugars   Why is this important?   You won't feel high blood pressure, but it can still hurt your blood vessels.  High blood pressure can cause heart or kidney problems. It can also cause a stroke.  Making lifestyle changes like losing a little weight or eating less salt will help.  Checking your blood pressure at home and at different times of the day can help to control blood pressure.  If the doctor prescribes medicine remember to take it the way the doctor ordered.  Call the office if you cannot afford the medicine or if there are questions about it.     Notes:        (RNCM) Prevent further Falls and Injury   On track    Timeframe:  Long-Range Goal Priority:  High Start Date:   07/23/20                          Expected End Date:  05/18/21                     Follow Up Date 03/13/21    Keep my cell phone with me always Make an emergency alert plan in case I fall Use a Rolator with all ambulation Continue to work with home health therapy Take time and move slowly; try not to rush Wear you life alert/call bell around your neck at all times Notify provider if back/hip/leg pain worsens Consider taking pain medication prior to therapy sessions   Why is this important?   Most falls happen when it is hard for you to walk safely. Your balance may be off because of an illness. You may have pain in your knees, hip or other joints.  You may be overly tired or taking medicines  that make you sleepy. You may not be able to see or hear clearly.  Falls can lead to broken bones, bruises or other injuries.  There are things you can do to help prevent falling.     Notes:          Patient verbalizes understanding of instructions provided today and agrees to view in [place].   The care management team will reach out to the patient again over the next 45 business days.   Hubert Azure RN, MSN RN Care Management Coordinator [place] [phone] [email or web address]

## 2021-02-06 NOTE — Chronic Care Management (AMB) (Signed)
Chronic Care Management   CCM RN Visit Note  02/06/2021 Name: Kara [name] MRN: [id number] DOB: 1923/01/23  Subjective: Kara [name] is a 87 y.o. year old female who is a primary care patient of Derrel Nip, Aris Everts, MD. The care management team was consulted for assistance with disease management and care coordination needs.    Engaged with patient by telephone for follow up visit in response to provider referral for case management and/or care coordination services.   Consent to Services:  The patient was given information about Chronic Care Management services, agreed to services, and gave verbal consent prior to initiation of services.  Please see initial visit note for detailed documentation.   Patient agreed to services and verbal consent obtained.   Assessment: Review of patient past medical history, allergies, medications, health status, including review of consultants reports, laboratory and other test data, was performed as part of comprehensive evaluation and provision of chronic care management services.   SDOH (Social Determinants of Health) assessments and interventions performed:    CCM Care Plan  Allergies  Allergen Reactions   Baycol [Cerivastatin Sodium]    Cardizem [Diltiazem Hcl]     LOWER EXTREMITY EDEMA   Crestor [Rosuvastatin Calcium] Other (See Comments)    INTOLERANT   Dronedarone Other (See Comments)    Fatigue   Metoprolol Tartrate Other (See Comments)   Omeprazole Other (See Comments)   Pravachol     INTOLERANT   Statins Other (See Comments)   Vioxx [Rofecoxib]    Zocor [Simvastatin]     INTOLERANT    Outpatient Encounter Medications as of 02/06/2021  Medication Sig   acetaminophen (TYLENOL) 500 MG tablet Take 500 mg by mouth every 6 (six) hours as needed for mild pain or moderate pain.   cyanocobalamin (,VITAMIN B-12,) 1000 MCG/ML injection INJECT 1ML INTO THE MUSCLE EVERY 14 DAYS   diphenoxylate-atropine (LOMOTIL) 2.5-0.025 MG tablet Take 1 tablet  by mouth 4 (four) times daily as needed for diarrhea or loose stools.   Infant Care Products [facility]) OINT Apply 1 application topically as needed. (Patient not taking: Reported on 01/03/2021)   lidocaine (LIDODERM) 5 % Place 1 patch onto the skin daily. Remove & Discard patch within 12 hours or as directed by MD   meloxicam (MOBIC) 7.5 MG tablet TAKE 1 TABLET BY MOUTH DAILY   metaxalone (SKELAXIN) 800 MG tablet Take 400 mg by mouth 3 (three) times daily as needed for muscle spasms.   metoprolol succinate (TOPROL-XL) 25 MG 24 hr tablet Take 1 tablet (25 mg total) by mouth 2 (two) times daily.   ondansetron (ZOFRAN) 4 MG tablet Take 1 tablet (4 mg total) by mouth every 6 (six) hours as needed for nausea. (Patient not taking: Reported on 01/03/2021)   traMADol (ULTRAM) 50 MG tablet Take 1 tablet (50 mg total) by mouth every 12 (twelve) hours as needed for moderate pain.   triamcinolone (KENALOG) 0.1 % APPLY 1 APPLICATION TOPICALLY 2 TIMES DAILY TO VULVA UNTIL ITCHING RESOLVES THEN REDUCE USE TO TWICE WEEKLY.   No facility-administered encounter medications on file as of 02/06/2021.    Patient Active Problem List   Diagnosis Date Noted   Pressure injury of skin 11/14/2020   Intractable back pain 11/14/2020   Compression fx, lumbar spine, sequela 11/13/2020   Closed compression fracture of L2 lumbar vertebra, initial encounter ([place]) 11/08/2020   Lumbar spinal stenosis 11/08/2020   Closed intertrochanteric fracture of left femur ([place]) 08/22/2020   Contusion of lower back  08/22/2020   Left buttock pain 07/30/2020   Left hand pain 07/30/2020   History of recent fall 07/30/2020   Hospital discharge follow-up 07/22/2020   Intraparenchymal hemorrhage of brain ([place]) 07/13/2020   Syncope and collapse 07/13/2020   Orbital fracture ([place]) 07/13/2020   Hypokalemia 07/13/2020   Atrial fibrillation, chronic ([place]) 07/13/2020   HTN (hypertension) 07/13/2020   DNR (do not resuscitate) 07/13/2020   S/P  placement of cardiac pacemaker 07/03/2020   Myalgia due to statin 07/03/2020   Aortic atherosclerosis ([place]) 07/03/2020   Displaced intertrochanteric fracture of left femur, sequela 06/25/2019   Age-related osteoporosis with current pathological fracture with routine healing 06/15/2019   Sciatica of right side 02/18/2019   Vaginitis and vulvovaginitis 12/26/2018   Educated about COVID-19 virus infection 10/12/2018   Edema of lower extremity 11/24/2017   Exertional dyspnea 06/16/2017   Venous insufficiency of both lower extremities 11/24/2016   Essential hypertension 11/11/2016   Peripheral artery disease ([place]) 11/11/2016   Chronic pulmonary hypertension ([place]) 10/20/2016   Leg swelling 06/17/2016   Type 2 diabetes mellitus with diabetic neuropathy, unspecified ([place]) 06/17/2016   Urinary incontinence, urge 12/27/2015   Atrophic vaginitis 09/26/2015   Severe tricuspid valve insufficiency 50/07/7046   Umbilical hernia without obstruction and without gangrene 06/26/2015   Carpal tunnel syndrome of right wrist 05/29/2015   Chronic fatigue 05/01/2015   E. coli UTI 11/21/2014   Vaginal atrophy 11/21/2014   Urethral caruncle 11/21/2014   Mitral valve regurgitation 05/09/2014   Fatigue 03/09/2014   SSS (sick sinus syndrome) ([place]) 10/11/2013   Encounter for current long-term use of anticoagulants 10/06/2012   Osteoarthritis 03/08/2012   Screening for breast cancer 03/08/2012   Pernicious anemia 02/04/2012   History of colectomy 02/04/2012   Vitamin D deficiency 07/30/2011   Acquired thrombophilia ([place]) 06/13/2011   Coronary atherosclerosis due to calcified coronary lesion 06/13/2011   Chronic diarrhea 06/13/2011    Conditions to be addressed/monitored:HTN and Falls  Care Plan : Hypertension (Adult)  Updates made by Leona Singleton, RN since 02/06/2021 12:00 AM     Problem: Hypertension/Diabetes   Priority: Medium     Long-Range Goal: Hypertension Monitored/ Report maintaining  Hgb A1C of below 7   Start Date: 07/23/2020  Expected End Date: 07/16/2021  This Visit's Progress: Not on track  Recent Progress: Not on track  Priority: Medium  Note:   Objective:  Lab Results  Component Value Date   HGBA1C 7.2 (H) 11/14/2020  Last practice recorded BP readings:  BP Readings from Last 3 Encounters:  01/03/21 (!) 142/90  11/17/20 (!) 142/58  11/09/20 (!) 146/78  Current Barriers:  Knowledge Deficits related to basic understanding of hypertension pathophysiology and self care management as evidenced by patient still stating she does not monitor blood pressures at home.  Re discussed monitoring blood pressures at home and asking the nurses at the [facility] to help.  Does not monitor blood sugars.  States she does try to limit her carbohydrate intake.   Unable to perform ADLs independently Unable to perform IADLs independently Nurse Case Manager Clinical Goal(s):  Over the next 120 days, patient will verbalize understanding of plan for hypertension management and report maintaining Hgb A1C of below 7 Over the next 120 days, patient will demonstrate improved adherence to prescribed treatment plan for hypertension as evidenced by taking all medications as prescribed, monitoring and recording blood pressure as directed (monthly), adhering to low sodium/DASH diabetic carbohydrate modified diet Interventions:   Collaboration with Derrel Nip,  Helene Kelp regarding development and update of comprehensive plan of care as evidenced by provider attestation and co-signature Inter-disciplinary care team collaboration (see longitudinal plan of care) Evaluation of current treatment plan related to hypertension self management and patient's adherence to plan as established by provider. Provided education to patient re: stroke prevention, s/s of heart attack and stroke, DASH diet, complications of uncontrolled blood pressure Reviewed medications with patient and discussed importance  of compliance Discussed plans with patient for ongoing care management follow up and provided patient with direct contact information for care management team Advised patient, providing education and rationale, to monitor blood pressure monthly and record, calling PCP for findings outside established parameters.  Reviewed scheduled/upcoming provider appointments including: PCP 03/05/21 Encouraged patient to request blood pressure checks by nursing staff at [facility] last few blood pressure elevations at providers office and discussed need to monitor blood pressures at home to verify trend; discussed importance of blood pressure control Encouraged patient to use 2022 Calendar Booklet previously sent to help document blood pressures and keep track of appointments Reviewed current Hgb A1C and discussed ways to keep within goal Reviewed and encouraged carbohydrate modified low salt diabetic diet Encouraged to increase activity as tolerated Provided patient emotional support and empathy Patient Goals/Self-Care Activities:  Over the next 45 days, patient will: Consider checking blood pressures at least twice a week Ask nursing staff to check your blood pressure  twice a week Low salt carbohydrate modified diabetic diet Limit intake of sweets and sugars Follow Up Plan: The care management team will reach out to the patient again over the next 45 business days.       Care Plan : Fall Risk (Adult)  Updates made by Leona Singleton, RN since 02/06/2021 12:00 AM     Problem: Increase fall risk related to resent fall with orbital and facial  fractures   Priority: High     Long-Range Goal: Patient will report no more falls within the next 90 days   Start Date: 07/23/2020  Expected End Date: 08/16/2021  This Visit's Progress: On track  Recent Progress: On track  Priority: High  Note:   Current Barriers:  Knowledge Deficits related to fall precautions in patient with recent fall  resulting in facial and orbital fractures and intracranial hemorrhage with right finger fracture.  Underwent L2 kyphoplasty/sacroplasty on 6/30/222;  Managing pain with tylenol and Tramadol; denies any recent falls and continues with home therapy;  Does report increase in tiredness in back when standing, feels she may need steroid injection, encouraged to speak with provider Decreased adherence to prescribed treatment for fall prevention Unable to perform ADLs independently at this time, needing assistance Unable to perform IADLs independently at this time needing assistance Clinical Goal(s):  patient will demonstrate improved adherence to prescribed treatment plan for decreasing falls as evidenced by patient reporting no falls in the next 45 days and review of EMR patient will verbalize using fall risk reduction strategies discussed patient will not experience additional falls patient will work with home health therapy to address needs related to fall precautions and increasing strength Interventions:  Collaboration with Deborra Medina regarding development and update of comprehensive plan of care as evidenced by provider attestation and co-signature Inter-disciplinary care team collaboration (see longitudinal plan of care) Provided verbal education re: Potential causes of falls and Fall prevention strategies Reviewed medications and discussed potential side effects of medications such as dizziness and frequent urination Assessed patients knowledge of fall risk prevention  Reviewed scheduled/upcoming  provider appointments including:  PCP October 2022 Assistive or adaptive device use encouraged, discussed and encouraged use of walker with all ambulation Cognition assessed Fall prevention plan reviewed and updated Fear of falling, loss of independence and pain acknowledged Participation in rehabilitation therapy encouraged, encouraged to increase activity slowly as tolerated, encouraged to ask  therapist to do strengthening exercises Encouraged to do range of motion exercises while sitting Discussed importance of social interaction and encouraged patient go out of apartment to common areas at least 3 times a week Emotional support and empathy provided to patient Discussed pain management and taking medications as prescribed Reviewed and discussed signs and symptoms of infection and encouraged patient to monitor Patient Goals/Self-Care Deficits:   Keep my cell phone with me always Make an emergency alert plan in case I fall Use a Rolator with all ambulation Continue to work with home health therapy Take time and move slowly; try not to rush Wear you life alert/call bell around your neck at all times Notify provider if back/hip/leg pain worsens Consider taking pain medication prior to therapy sessions Follow Up Plan: RNCM will outreach to patient within the next 45 business days       Plan:The care management team will reach out to the patient again over the next 45 business days.  Hubert Azure RN, MSN RN Care Management Coordinator [facility] [phone] [email or web address]

## 2021-02-08 ENCOUNTER — Telehealth: Payer: Self-pay | Admitting: Internal Medicine

## 2021-02-08 DIAGNOSIS — S8991XA Unspecified injury of right lower leg, initial encounter: Secondary | ICD-10-CM | POA: Diagnosis not present

## 2021-02-08 DIAGNOSIS — I739 Peripheral vascular disease, unspecified: Secondary | ICD-10-CM | POA: Diagnosis not present

## 2021-02-08 NOTE — Telephone Encounter (Signed)
Patient informed, Due to the high volume of calls and your symptoms we have to forward your call to our Triage Nurse to expedient your call. Please hold for the transfer.  Patient transferred to Access Nurse. Due to having a bruise above her right shin that is slightly warm and red.No openings in office or virtual.

## 2021-02-08 NOTE — Telephone Encounter (Signed)
Called the patient to triage. Left a message to call back.  Pt called into access nurse stating that she was scratching her leg and noticed a bruise on her right shin. Pt states that she has neuropathy and the nurse [facility] was worried about it turning into cellulitis. Pt was instructed to go to the ED and verbalized understanding to the access nurse. Pt has an appointment scheduled with PCP on 02/14/21.

## 2021-02-11 ENCOUNTER — Other Ambulatory Visit: Payer: Self-pay | Admitting: Internal Medicine

## 2021-02-11 DIAGNOSIS — M545 Low back pain, unspecified: Secondary | ICD-10-CM | POA: Diagnosis not present

## 2021-02-11 DIAGNOSIS — M5459 Other low back pain: Secondary | ICD-10-CM | POA: Diagnosis not present

## 2021-02-11 DIAGNOSIS — R2689 Other abnormalities of gait and mobility: Secondary | ICD-10-CM | POA: Diagnosis not present

## 2021-02-11 DIAGNOSIS — R2681 Unsteadiness on feet: Secondary | ICD-10-CM | POA: Diagnosis not present

## 2021-02-11 DIAGNOSIS — M6281 Muscle weakness (generalized): Secondary | ICD-10-CM | POA: Diagnosis not present

## 2021-02-11 NOTE — Telephone Encounter (Signed)
Kara [name] returned the call from Friday. Pt states that she was evaluated for her leg on 02/08/21. Pt states that she wanted to discuss a Different issue with Dr. Derrel Nip on 02/14/21. Pt states that on 02/10/21 she had a sharp pain along her lower back at the edge of her ribs. The pain is present when walking, breathing deeply, or turning. Pt states that she has looked at her back and saw 2 dark red moles that are new. Pt believed the pain to be from Physical Therapy but was uncertain. Pt states that the pain was not unbearable and was not urgent, but was constant. Pt states that she will see her PCP on 02/14/21.

## 2021-02-11 NOTE — Telephone Encounter (Signed)
RX Refill:Lomotil Last Seen:01-03-21 Last ordered:07-14-20

## 2021-02-13 DIAGNOSIS — R2681 Unsteadiness on feet: Secondary | ICD-10-CM | POA: Diagnosis not present

## 2021-02-13 DIAGNOSIS — M6281 Muscle weakness (generalized): Secondary | ICD-10-CM | POA: Diagnosis not present

## 2021-02-13 DIAGNOSIS — R2689 Other abnormalities of gait and mobility: Secondary | ICD-10-CM | POA: Diagnosis not present

## 2021-02-13 DIAGNOSIS — M545 Low back pain, unspecified: Secondary | ICD-10-CM | POA: Diagnosis not present

## 2021-02-13 DIAGNOSIS — M5459 Other low back pain: Secondary | ICD-10-CM | POA: Diagnosis not present

## 2021-02-14 ENCOUNTER — Other Ambulatory Visit: Payer: Self-pay

## 2021-02-14 ENCOUNTER — Ambulatory Visit (INDEPENDENT_AMBULATORY_CARE_PROVIDER_SITE_OTHER): Payer: Medicare Other | Admitting: Internal Medicine

## 2021-02-14 ENCOUNTER — Encounter: Payer: Self-pay | Admitting: Internal Medicine

## 2021-02-14 VITALS — BP 138/68 | HR 97 | Temp 98.0°F | Ht 62.0 in | Wt 122.4 lb

## 2021-02-14 DIAGNOSIS — R350 Frequency of micturition: Secondary | ICD-10-CM

## 2021-02-14 DIAGNOSIS — E559 Vitamin D deficiency, unspecified: Secondary | ICD-10-CM | POA: Diagnosis not present

## 2021-02-14 DIAGNOSIS — I482 Chronic atrial fibrillation, unspecified: Secondary | ICD-10-CM | POA: Diagnosis not present

## 2021-02-14 DIAGNOSIS — D6869 Other thrombophilia: Secondary | ICD-10-CM

## 2021-02-14 DIAGNOSIS — R944 Abnormal results of kidney function studies: Secondary | ICD-10-CM | POA: Diagnosis not present

## 2021-02-14 DIAGNOSIS — M8000XD Age-related osteoporosis with current pathological fracture, unspecified site, subsequent encounter for fracture with routine healing: Secondary | ICD-10-CM | POA: Diagnosis not present

## 2021-02-14 DIAGNOSIS — I2510 Atherosclerotic heart disease of native coronary artery without angina pectoris: Secondary | ICD-10-CM

## 2021-02-14 DIAGNOSIS — I10 Essential (primary) hypertension: Secondary | ICD-10-CM | POA: Diagnosis not present

## 2021-02-14 DIAGNOSIS — M5431 Sciatica, right side: Secondary | ICD-10-CM | POA: Diagnosis not present

## 2021-02-14 DIAGNOSIS — R300 Dysuria: Secondary | ICD-10-CM | POA: Diagnosis not present

## 2021-02-14 DIAGNOSIS — S32000S Wedge compression fracture of unspecified lumbar vertebra, sequela: Secondary | ICD-10-CM

## 2021-02-14 DIAGNOSIS — I2584 Coronary atherosclerosis due to calcified coronary lesion: Secondary | ICD-10-CM

## 2021-02-14 DIAGNOSIS — E1140 Type 2 diabetes mellitus with diabetic neuropathy, unspecified: Secondary | ICD-10-CM | POA: Diagnosis not present

## 2021-02-14 DIAGNOSIS — S72142S Displaced intertrochanteric fracture of left femur, sequela: Secondary | ICD-10-CM

## 2021-02-14 DIAGNOSIS — Z950 Presence of cardiac pacemaker: Secondary | ICD-10-CM | POA: Diagnosis not present

## 2021-02-14 LAB — COMPREHENSIVE METABOLIC PANEL
ALT: 11 U/L (ref 0–35)
AST: 15 U/L (ref 0–37)
Albumin: 3.8 g/dL (ref 3.5–5.2)
Alkaline Phosphatase: 63 U/L (ref 39–117)
BUN: 25 mg/dL — ABNORMAL HIGH (ref 6–23)
CO2: 27 mEq/L (ref 19–32)
Calcium: 9.1 mg/dL (ref 8.4–10.5)
Chloride: 103 mEq/L (ref 96–112)
Creatinine, Ser: 0.79 mg/dL (ref 0.40–1.20)
GFR: 63.15 mL/min (ref >60.00)
Glucose, Bld: 132 mg/dL — ABNORMAL HIGH (ref 70–99)
Potassium: 3.6 mEq/L (ref 3.5–5.1)
Sodium: 140 mEq/L (ref 135–145)
Total Bilirubin: 1.3 mg/dL — ABNORMAL HIGH (ref 0.2–1.2)
Total Protein: 6.4 g/dL (ref 6.0–8.3)

## 2021-02-14 LAB — URINALYSIS, ROUTINE W REFLEX MICROSCOPIC
Bilirubin Urine: NEGATIVE
Ketones, ur: NEGATIVE
Nitrite: POSITIVE — AB
RBC / HPF: NONE SEEN (ref 0–?)
Specific Gravity, Urine: 1.01 (ref 1.000–1.030)
Total Protein, Urine: NEGATIVE
Urine Glucose: NEGATIVE
Urobilinogen, UA: 0.2 (ref 0.0–1.0)
pH: 6 (ref 5.0–8.0)

## 2021-02-14 LAB — RENAL FUNCTION PANEL
Albumin: 3.8 g/dL (ref 3.5–5.2)
BUN: 25 mg/dL — ABNORMAL HIGH (ref 6–23)
CO2: 27 mEq/L (ref 19–32)
Calcium: 9.1 mg/dL (ref 8.4–10.5)
Chloride: 103 mEq/L (ref 96–112)
Creatinine, Ser: 0.79 mg/dL (ref 0.40–1.20)
GFR: 63.15 mL/min (ref >60.00)
Glucose, Bld: 132 mg/dL — ABNORMAL HIGH (ref 70–99)
Phosphorus: 4 mg/dL (ref 2.3–4.6)
Potassium: 3.6 mEq/L (ref 3.5–5.1)
Sodium: 140 mEq/L (ref 135–145)

## 2021-02-14 LAB — VITAMIN D 25 HYDROXY (VIT D DEFICIENCY, FRACTURES): VITD: 19.06 ng/mL — ABNORMAL LOW (ref 30.00–100.00)

## 2021-02-14 LAB — MICROALBUMIN / CREATININE URINE RATIO
Creatinine,U: 44.6 mg/dL
Microalb Creat Ratio: 1.8 mg/g (ref 0.0–30.0)
Microalb, Ur: 0.8 mg/dL (ref 0.0–1.9)

## 2021-02-14 LAB — HEMOGLOBIN A1C: Hgb A1c MFr Bld: 6.4 % (ref 4.6–6.5)

## 2021-02-14 NOTE — Progress Notes (Signed)
Subjective:  Patient ID: Kara [name], female    DOB: Nov 06, 1922  Age: 87 y.o. MRN: [id number]  CC: The primary encounter diagnosis was Dysuria. Diagnoses of Type 2 diabetes mellitus with diabetic neuropathy, without long-term current use of insulin ([place]), Vitamin D deficiency, Decreased GFR, Atrial fibrillation, chronic (HCC), Acquired thrombophilia ([place]), Essential hypertension, Sciatica of right side, Compression fx, lumbar spine, sequela, Displaced intertrochanteric fracture of left femur, sequela, Age-related osteoporosis with current pathological fracture with routine healing, S/P placement of cardiac pacemaker, and Urinary frequency were also pertinent to this visit.  HPI Kara [name] presents for  follow up on multiple issues  Chief Complaint  Patient presents with   Follow-up    This visit occurred during the SARS-CoV-2 public health emergency.  Safety protocols were in place, including screening questions prior to the visit, additional usage of staff PPE, and extensive cleaning of exam room while observing appropriate contact time as indicated for disinfecting solutions.   1) h/o compression vertebral fracture lumbar spine in June with severe unrelenting pain,  underwent kyphoplasty on L2   on June 30  without significant relief of pain and right sided radiculopathy until the last several weeks.  She was prescribed oxycodone 7.5mg  q 8 hours but is not longer taking it.  Did not tolerate gabapentin. Today she reports that the pain is low in the back without radiation and minimal to moderate, and  aggravated by prolonged standing and walking.  She is using tylenol and meloxicam  but taking the tylenol only prn   Considering getting the ESI through Dr Loistine Chance.   Taking PT for poor balance at [facility],  frustrated that the therapist spends too mch time talking yand not enough  time focusing on improving her proximal muscle strength. . Using the NuStep     Unintentional wight loss:  she has  been increasing her protein intake.  Tried to add a whole scoop of  protein powder in milk, gave her too much diarrhea. Drinking the  protein drinks is better tolerated   New pain involving the right posterior  mid thorax ,  has some lesions thinks they have enlarged. Pain has been present for the past  week, does not involve the spine,  not aggravated by breathing .  Exam:  scattered cherry angiomas.  No erythema  Developed a bruise on right medial lower leg,  above ankle .  Did not remember what caused it.  Caregivers overreacted and called [facility] who told her to go to the ER . Spent 5 hours in the waiting room  to be evaluated for possible cellulitis; during the prolonged wait she  remembered that she had fallen earlier in the week in the dining hall.   Leg was x rayed and normal.   Occasional intermittent dysuria,  urine smells bad  Wonders if her leg length discrepancy which she feels occurred because of her  hip replacement is contributing to her balance issues    Has not taken vitamin d since her fall   Outpatient Medications Prior to Visit  Medication Sig Dispense Refill   acetaminophen (TYLENOL) 500 MG tablet Take 500 mg by mouth every 6 (six) hours as needed for mild pain or moderate pain.     cyanocobalamin (,VITAMIN B-12,) 1000 MCG/ML injection INJECT 1ML INTO THE MUSCLE EVERY 14 DAYS 10 mL 0   diphenoxylate-atropine (LOMOTIL) 2.5-0.025 MG tablet TAKE 1 TABLET BY MOUTH THREE TIMES DAILYAS NEEDED FOR DIARRHEA 90 tablet 2  [employer] [facility]) OINT Apply 1 application topically as needed. 430 g 1   meloxicam (MOBIC) 7.5 MG tablet TAKE 1 TABLET BY MOUTH DAILY 30 tablet 1   metoprolol succinate (TOPROL-XL) 25 MG 24 hr tablet Take 1 tablet (25 mg total) by mouth 2 (two) times daily.     triamcinolone (KENALOG) 0.1 % APPLY 1 APPLICATION TOPICALLY 2 TIMES DAILY TO VULVA UNTIL ITCHING RESOLVES THEN REDUCE USE TO TWICE WEEKLY. 15 g 1   lidocaine (LIDODERM) 5 % Place 1 patch  onto the skin daily. Remove & Discard patch within 12 hours or as directed by MD (Patient not taking: Reported on 02/14/2021) 30 patch 0   metaxalone (SKELAXIN) 800 MG tablet Take 400 mg by mouth 3 (three) times daily as needed for muscle spasms. (Patient not taking: Reported on 02/14/2021)     ondansetron (ZOFRAN) 4 MG tablet Take 1 tablet (4 mg total) by mouth every 6 (six) hours as needed for nausea. (Patient not taking: Reported on 02/14/2021) 20 tablet 0   traMADol (ULTRAM) 50 MG tablet Take 1 tablet (50 mg total) by mouth every 12 (twelve) hours as needed for moderate pain. (Patient not taking: Reported on 02/14/2021) 60 tablet 2   No facility-administered medications prior to visit.    Review of Systems;  Patient denies headache, fevers, malaise, unintentional weight loss, skin rash, eye pain, sinus congestion and sinus pain, sore throat, dysphagia,  hemoptysis , cough, dyspnea, wheezing, chest pain, palpitations, orthopnea, edema, abdominal pain, nausea, melena, diarrhea, constipation, flank pain, dysuria, hematuria, urinary  Frequency, nocturia, numbness, tingling, seizures,  Focal weakness, Loss of consciousness,  Tremor, insomnia, depression, anxiety, and suicidal ideation.      Objective:  BP 138/68 (BP Location: Left Arm, Patient Position: Sitting, Cuff Size: Normal)   Pulse 97   Temp 98 F (36.7 C) (Oral)   Ht 5\' 2"  (1.575 m)   Wt 122 lb 6.4 oz (55.5 kg)   SpO2 97%   BMI 22.39 kg/m   BP Readings from Last 3 Encounters:  02/14/21 138/68  01/03/21 (!) 142/90  11/17/20 (!) 142/58    Wt Readings from Last 3 Encounters:  02/14/21 122 lb 6.4 oz (55.5 kg)  01/03/21 120 lb 6.4 oz (54.6 kg)  11/15/20 121 lb 14.6 oz (55.3 kg)    General appearance: alert, cooperative and appears stated age Ears: normal TM's and external ear canals both ears Throat: lips, mucosa, and tongue normal; teeth and gums normal Neck: no adenopathy, no carotid bruit, supple, symmetrical, trachea midline  and thyroid not enlarged, symmetric, no tenderness/mass/nodules Back: symmetric, no curvature. ROM normal. No CVA tenderness. Right posterior thorax without bruising or tenderness Lungs: clear to auscultation bilaterally Heart: regular rate and rhythm, S1, S2 normal, no murmur, click, rub or gallop Abdomen: soft, non-tender; bowel sounds normal; no masses,  no organomegaly Pulses: 2+ and symmetric Skin: right posterior thorax with scattered cherry angiomas,  no erythema or rash  Lymphnodes: Cervical, supraclavicular, and axillary nodes normal.  Lab Results  Component Value Date   HGBA1C 6.4 02/14/2021   HGBA1C 7.2 (H) 11/14/2020   HGBA1C 6.3 (H) 07/14/2020    Lab Results  Component Value Date   CREATININE 0.79 02/14/2021   CREATININE 0.79 02/14/2021   CREATININE 0.64 11/15/2020    Lab Results  Component Value Date   WBC 8.6 11/14/2020   HGB 13.7 11/14/2020   HCT 41.4 11/14/2020   PLT 176 11/14/2020   GLUCOSE 132 (H) 02/14/2021   GLUCOSE  132 (H) 02/14/2021   CHOL 142 10/11/2013   TRIG 194.0 (H) 10/11/2013   HDL 43.10 10/11/2013   LDLDIRECT 86.3 06/30/2012   LDLCALC 60 10/11/2013   ALT 11 02/14/2021   AST 15 02/14/2021   NA 140 02/14/2021   NA 140 02/14/2021   K 3.6 02/14/2021   K 3.6 02/14/2021   CL 103 02/14/2021   CL 103 02/14/2021   CREATININE 0.79 02/14/2021   CREATININE 0.79 02/14/2021   BUN 25 (H) 02/14/2021   BUN 25 (H) 02/14/2021   CO2 27 02/14/2021   CO2 27 02/14/2021   TSH 3.15 12/29/2019   INR 1.1 07/13/2020   HGBA1C 6.4 02/14/2021   MICROALBUR 0.8 02/14/2021    NM Bone W/Spect  Result Date: 11/14/2020 CLINICAL DATA:  Severe back pain EXAM: NM BONE SCAN AND SPECT IMAGING TECHNIQUE: After intravenous injection of radiopharmaceutical, delayed planar images were obtained in multiple projections. Additionally, delayed triplanar SPECT images were obtained through the area of interest. RADIOPHARMACEUTICALS:  21.9 mCi Tc-36m MDP COMPARISON:  None.  FINDINGS: There is a medium size focus of intense radiotracer uptake localizing to the posterior and superior aspect of the L2 vertebral body. Here, there is a compression fracture with loss of approximately 25% of the vertebral body height as demonstrated on the CT from 11/06/2020. Asymmetric increased radiotracer uptake localizes to the nondisplaced left zone 1 sacral insufficiency fracture. Mild increased uptake at the L5-S1 level compatible with degenerative disc disease. Other: On the CT portion of the exam there is extensive aortic atherosclerosis. Calcified gallstones identified within the gallbladder. Midline infraumbilical ventral abdominal wall hernia contains fat only. IMPRESSION: 1. There is intense radiotracer uptake localizing to the superior endplate fracture deformity involving the L2 vertebral body compatible with acute to subacute fracture. 2. Mild asymmetric increased uptake localizes to the left sacral wing insufficiency fracture demonstrated on previous CT. Electronically Signed   By: Kerby Moors M.D.   On: 11/14/2020 15:44    Assessment & Plan:   Problem List Items Addressed This Visit       Unprioritized   Acquired thrombophilia ([place])    Secondary to atrial fibrillation and concurrent diabetes.  She has discontinued  Use of  Eliquis for embolic stroke risk mitigation since her vertebral fracture. Given her increased risk of falls and planned ESI,  Did not resume.   Lab Results  Component Value Date   WBC 8.6 11/14/2020   HGB 13.7 11/14/2020   HCT 41.4 11/14/2020   MCV 93.0 11/14/2020   PLT 176 11/14/2020         Age-related osteoporosis with current pathological fracture with routine healing   Atrial fibrillation, chronic (HCC)    Continue low dose  metoprolol .  She has discontinued Eliquis       Compression fx, lumbar spine, sequela    Her pain is now managed with  meloxicam to 7.5 mg daily and prn tylenol;  Advised to max out tylenol at 2000 mg daily .        Displaced intertrochanteric fracture of left femur, sequela   Essential hypertension    Adequate control for her age on metoprolol alone. No changes today       S/P placement of cardiac pacemaker   Sciatica of right side    Secondary to vertebral fracture and concurrent degenerative changes in lumbar spine.  Symptoms have improved since August ; she is considering an ESI by [facility]       Type 2 diabetes mellitus  with diabetic neuropathy, unspecified ([place])   Urinary frequency    With history of E Coli UTI.  She has pyuria , nitrites and bacteria.  Awaiting culture data.       Other Visit Diagnoses     Dysuria    -  Primary   Relevant Orders   Urinalysis, Routine w reflex microscopic (Completed)   Urine Culture   Vitamin D deficiency       Decreased GFR           I spent 45 minutes dedicated to the care of this patient on the date of this encounter to include pre-visit review of his medical history,  Face-to-face time with the patient , and post visit ordering of testing and therapeutics.   There are no discontinued medications.  Follow-up: No follow-ups on file.   Crecencio Mc, MD

## 2021-02-14 NOTE — Patient Instructions (Addendum)
  You can take up to 2000 mg of acetominophen (tylenol) every day safely  In divided doses (500 mg every 6 hours  Or 1000 mg two times daily ). THIS IS SAFE! SAFER THAN MELOXICAM to use on a daily basis .  Decrease meloxicam use to as needed instead of  DAILY . You can  Add 50 mg tramadol if your pain is not improved after taking  tylenol and meloxicam    Do not use metaxalone ;  it is too sedating (muscle relaxer)   I do recommend the ESI (epidural steroid injection) for your back pain.Marland Kitchen  by Dr Loistine Chance   The spots on your right side are "cherry angiomas";  they are not enflamed and there is no sign of bruising

## 2021-02-15 DIAGNOSIS — I10 Essential (primary) hypertension: Secondary | ICD-10-CM | POA: Diagnosis not present

## 2021-02-16 ENCOUNTER — Encounter: Payer: Self-pay | Admitting: Internal Medicine

## 2021-02-16 DIAGNOSIS — R350 Frequency of micturition: Secondary | ICD-10-CM | POA: Insufficient documentation

## 2021-02-16 NOTE — Assessment & Plan Note (Addendum)
Continue low dose  metoprolol .  She has discontinued Eliquis

## 2021-02-16 NOTE — Assessment & Plan Note (Signed)
Secondary to atrial fibrillation and concurrent diabetes.  She has discontinued  Use of  Eliquis for embolic stroke risk mitigation since her vertebral fracture. Given her increased risk of falls and planned ESI,  Did not resume.   Lab Results  Component Value Date   WBC 8.6 11/14/2020   HGB 13.7 11/14/2020   HCT 41.4 11/14/2020   MCV 93.0 11/14/2020   PLT 176 11/14/2020

## 2021-02-16 NOTE — Assessment & Plan Note (Signed)
Secondary to vertebral fracture and concurrent degenerative changes in lumbar spine.  Symptoms have improved since August ; she is considering an ESI by [employer]

## 2021-02-16 NOTE — Assessment & Plan Note (Addendum)
With history of E Coli UTI.  She has pyuria , nitrites and bacteria.  Awaiting culture data.

## 2021-02-16 NOTE — Assessment & Plan Note (Signed)
Her pain is now managed with  meloxicam to 7.5 mg daily and prn tylenol;  Advised to max out tylenol at 2000 mg daily .

## 2021-02-16 NOTE — Assessment & Plan Note (Signed)
Adequate control for her age on metoprolol alone. No changes today

## 2021-02-17 LAB — URINE CULTURE
MICRO NUMBER:: 12440113
SPECIMEN QUALITY:: ADEQUATE

## 2021-02-17 MED ORDER — CEFDINIR 300 MG PO CAPS
300.0000 mg | ORAL_CAPSULE | Freq: Two times a day (BID) | ORAL | 0 refills | Status: DC
Start: 2021-02-17 — End: 2021-07-30

## 2021-02-17 MED ORDER — ERGOCALCIFEROL 1.25 MG (50000 UT) PO CAPS: 50000.0000 [IU] | ORAL_CAPSULE | ORAL | 3 refills | Status: DC

## 2021-02-17 NOTE — Assessment & Plan Note (Signed)
Resistant to cipro .  Prescribing omnicef

## 2021-02-17 NOTE — Addendum Note (Signed)
Addended by: Crecencio Mc on: 02/17/2021 08:26 PM   Modules accepted: Orders

## 2021-02-18 DIAGNOSIS — R2681 Unsteadiness on feet: Secondary | ICD-10-CM | POA: Diagnosis not present

## 2021-02-18 DIAGNOSIS — M6281 Muscle weakness (generalized): Secondary | ICD-10-CM | POA: Diagnosis not present

## 2021-02-18 DIAGNOSIS — M545 Low back pain, unspecified: Secondary | ICD-10-CM | POA: Diagnosis not present

## 2021-02-18 DIAGNOSIS — M5459 Other low back pain: Secondary | ICD-10-CM | POA: Diagnosis not present

## 2021-02-18 DIAGNOSIS — R2689 Other abnormalities of gait and mobility: Secondary | ICD-10-CM | POA: Diagnosis not present

## 2021-02-20 DIAGNOSIS — M6281 Muscle weakness (generalized): Secondary | ICD-10-CM | POA: Diagnosis not present

## 2021-02-20 DIAGNOSIS — R2689 Other abnormalities of gait and mobility: Secondary | ICD-10-CM | POA: Diagnosis not present

## 2021-02-20 DIAGNOSIS — M545 Low back pain, unspecified: Secondary | ICD-10-CM | POA: Diagnosis not present

## 2021-02-20 DIAGNOSIS — R2681 Unsteadiness on feet: Secondary | ICD-10-CM | POA: Diagnosis not present

## 2021-02-20 DIAGNOSIS — M5459 Other low back pain: Secondary | ICD-10-CM | POA: Diagnosis not present

## 2021-02-25 DIAGNOSIS — R2689 Other abnormalities of gait and mobility: Secondary | ICD-10-CM | POA: Diagnosis not present

## 2021-02-25 DIAGNOSIS — M5459 Other low back pain: Secondary | ICD-10-CM | POA: Diagnosis not present

## 2021-02-25 DIAGNOSIS — M545 Low back pain, unspecified: Secondary | ICD-10-CM | POA: Diagnosis not present

## 2021-02-25 DIAGNOSIS — M6281 Muscle weakness (generalized): Secondary | ICD-10-CM | POA: Diagnosis not present

## 2021-02-25 DIAGNOSIS — R2681 Unsteadiness on feet: Secondary | ICD-10-CM | POA: Diagnosis not present

## 2021-02-27 DIAGNOSIS — Z23 Encounter for immunization: Secondary | ICD-10-CM | POA: Diagnosis not present

## 2021-03-04 DIAGNOSIS — R2681 Unsteadiness on feet: Secondary | ICD-10-CM | POA: Diagnosis not present

## 2021-03-04 DIAGNOSIS — E119 Type 2 diabetes mellitus without complications: Secondary | ICD-10-CM | POA: Diagnosis not present

## 2021-03-04 DIAGNOSIS — R2689 Other abnormalities of gait and mobility: Secondary | ICD-10-CM | POA: Diagnosis not present

## 2021-03-04 DIAGNOSIS — M545 Low back pain, unspecified: Secondary | ICD-10-CM | POA: Diagnosis not present

## 2021-03-04 DIAGNOSIS — M6281 Muscle weakness (generalized): Secondary | ICD-10-CM | POA: Diagnosis not present

## 2021-03-04 DIAGNOSIS — M5459 Other low back pain: Secondary | ICD-10-CM | POA: Diagnosis not present

## 2021-03-06 DIAGNOSIS — M545 Low back pain, unspecified: Secondary | ICD-10-CM | POA: Diagnosis not present

## 2021-03-06 DIAGNOSIS — M6281 Muscle weakness (generalized): Secondary | ICD-10-CM | POA: Diagnosis not present

## 2021-03-06 DIAGNOSIS — R2689 Other abnormalities of gait and mobility: Secondary | ICD-10-CM | POA: Diagnosis not present

## 2021-03-06 DIAGNOSIS — R2681 Unsteadiness on feet: Secondary | ICD-10-CM | POA: Diagnosis not present

## 2021-03-06 DIAGNOSIS — M5459 Other low back pain: Secondary | ICD-10-CM | POA: Diagnosis not present

## 2021-03-11 DIAGNOSIS — M545 Low back pain, unspecified: Secondary | ICD-10-CM | POA: Diagnosis not present

## 2021-03-11 DIAGNOSIS — R2681 Unsteadiness on feet: Secondary | ICD-10-CM | POA: Diagnosis not present

## 2021-03-11 DIAGNOSIS — M5459 Other low back pain: Secondary | ICD-10-CM | POA: Diagnosis not present

## 2021-03-11 DIAGNOSIS — M6281 Muscle weakness (generalized): Secondary | ICD-10-CM | POA: Diagnosis not present

## 2021-03-11 DIAGNOSIS — R2689 Other abnormalities of gait and mobility: Secondary | ICD-10-CM | POA: Diagnosis not present

## 2021-03-13 ENCOUNTER — Telehealth: Payer: Medicare Other

## 2021-03-13 ENCOUNTER — Telehealth: Payer: Self-pay | Admitting: *Deleted

## 2021-03-13 DIAGNOSIS — M6281 Muscle weakness (generalized): Secondary | ICD-10-CM | POA: Diagnosis not present

## 2021-03-13 DIAGNOSIS — R2681 Unsteadiness on feet: Secondary | ICD-10-CM | POA: Diagnosis not present

## 2021-03-13 DIAGNOSIS — R2689 Other abnormalities of gait and mobility: Secondary | ICD-10-CM | POA: Diagnosis not present

## 2021-03-13 DIAGNOSIS — M5459 Other low back pain: Secondary | ICD-10-CM | POA: Diagnosis not present

## 2021-03-13 DIAGNOSIS — M545 Low back pain, unspecified: Secondary | ICD-10-CM | POA: Diagnosis not present

## 2021-03-13 NOTE — Telephone Encounter (Signed)
  Care Management   Follow Up Note   03/13/2021 Name: Kara [name] MRN: [id number] DOB: 1923/03/23   Referred by: Crecencio Mc, MD Reason for referral : Chronic Care Management (Fall, HTN)   An unsuccessful telephone outreach was attempted today. The patient was referred to the case management team for assistance with care management and care coordination.   Follow Up Plan: RNCM will seek assistance from Care Guides in rescheduling appointment within the next 30 days.  Hubert Azure RN, MSN RN Care Management Coordinator [place] [phone] [email or web address]

## 2021-03-14 ENCOUNTER — Other Ambulatory Visit: Payer: Self-pay | Admitting: Internal Medicine

## 2021-03-18 DIAGNOSIS — R2689 Other abnormalities of gait and mobility: Secondary | ICD-10-CM | POA: Diagnosis not present

## 2021-03-18 DIAGNOSIS — M545 Low back pain, unspecified: Secondary | ICD-10-CM | POA: Diagnosis not present

## 2021-03-18 DIAGNOSIS — M5459 Other low back pain: Secondary | ICD-10-CM | POA: Diagnosis not present

## 2021-03-18 DIAGNOSIS — M6281 Muscle weakness (generalized): Secondary | ICD-10-CM | POA: Diagnosis not present

## 2021-03-18 DIAGNOSIS — R2681 Unsteadiness on feet: Secondary | ICD-10-CM | POA: Diagnosis not present

## 2021-03-19 ENCOUNTER — Telehealth: Payer: Self-pay

## 2021-03-19 NOTE — Chronic Care Management (AMB) (Signed)
  Care Management   Note  03/19/2021 Name: Kara [name] MRN: [id number] DOB: [birth year]  Kara [name] is a 87 y.o. year old female who is a primary care patient of Crecencio Mc, MD and is actively engaged with the care management team. I reached out to [name] by phone today to assist with re-scheduling a follow up visit with the RN Case Manager  Follow up plan: Unsuccessful telephone outreach attempt made. A HIPAA compliant phone message was left for the patient providing contact information and requesting a return call.  The care management team will reach out to the patient again over the next 7 days.  If patient returns call to provider office, please advise to call [facility] Lenore Manner  at [place], [place], [place], [place] [address] Direct Dial: [phone] [email or web address]: [email or web address]

## 2021-03-20 DIAGNOSIS — M48061 Spinal stenosis, lumbar region without neurogenic claudication: Secondary | ICD-10-CM | POA: Diagnosis not present

## 2021-03-20 DIAGNOSIS — M6281 Muscle weakness (generalized): Secondary | ICD-10-CM | POA: Diagnosis not present

## 2021-03-20 DIAGNOSIS — M5459 Other low back pain: Secondary | ICD-10-CM | POA: Diagnosis not present

## 2021-03-20 DIAGNOSIS — M5136 Other intervertebral disc degeneration, lumbar region: Secondary | ICD-10-CM | POA: Diagnosis not present

## 2021-03-20 DIAGNOSIS — M545 Low back pain, unspecified: Secondary | ICD-10-CM | POA: Diagnosis not present

## 2021-03-20 DIAGNOSIS — R2681 Unsteadiness on feet: Secondary | ICD-10-CM | POA: Diagnosis not present

## 2021-03-20 DIAGNOSIS — M5416 Radiculopathy, lumbar region: Secondary | ICD-10-CM | POA: Diagnosis not present

## 2021-03-20 DIAGNOSIS — R2689 Other abnormalities of gait and mobility: Secondary | ICD-10-CM | POA: Diagnosis not present

## 2021-03-25 DIAGNOSIS — M5459 Other low back pain: Secondary | ICD-10-CM | POA: Diagnosis not present

## 2021-03-25 DIAGNOSIS — R2681 Unsteadiness on feet: Secondary | ICD-10-CM | POA: Diagnosis not present

## 2021-03-25 DIAGNOSIS — M6281 Muscle weakness (generalized): Secondary | ICD-10-CM | POA: Diagnosis not present

## 2021-03-25 DIAGNOSIS — R2689 Other abnormalities of gait and mobility: Secondary | ICD-10-CM | POA: Diagnosis not present

## 2021-03-25 DIAGNOSIS — M545 Low back pain, unspecified: Secondary | ICD-10-CM | POA: Diagnosis not present

## 2021-03-27 DIAGNOSIS — R2681 Unsteadiness on feet: Secondary | ICD-10-CM | POA: Diagnosis not present

## 2021-03-27 DIAGNOSIS — M5459 Other low back pain: Secondary | ICD-10-CM | POA: Diagnosis not present

## 2021-03-27 DIAGNOSIS — M545 Low back pain, unspecified: Secondary | ICD-10-CM | POA: Diagnosis not present

## 2021-03-27 DIAGNOSIS — R2689 Other abnormalities of gait and mobility: Secondary | ICD-10-CM | POA: Diagnosis not present

## 2021-03-27 DIAGNOSIS — M6281 Muscle weakness (generalized): Secondary | ICD-10-CM | POA: Diagnosis not present

## 2021-03-28 DIAGNOSIS — M5416 Radiculopathy, lumbar region: Secondary | ICD-10-CM | POA: Diagnosis not present

## 2021-03-28 DIAGNOSIS — M48062 Spinal stenosis, lumbar region with neurogenic claudication: Secondary | ICD-10-CM | POA: Diagnosis not present

## 2021-03-28 NOTE — Chronic Care Management (AMB) (Signed)
  Care Management   Note  03/28/2021 Name: Kara [name] MRN: [id number] DOB: 10/18/1922  Kara [name] is a 87 y.o. year old female who is a primary care patient of Derrel Nip, Aris Everts, MD and is actively engaged with the care management team. I reached out to [name] by phone today to assist with re-scheduling a follow up visit with the RN Case Manager  Follow up plan: Unsuccessful telephone outreach attempt made. A HIPAA compliant phone message was left for the patient providing contact information and requesting a return call.  The care management team will reach out to the patient again over the next 7 days.  If patient returns call to provider office, please advise to call [facility]  at [place], [place], [place], [place] [address] Direct Dial: [phone] [email or web address]: [email or web address]

## 2021-04-01 DIAGNOSIS — R2689 Other abnormalities of gait and mobility: Secondary | ICD-10-CM | POA: Diagnosis not present

## 2021-04-01 DIAGNOSIS — M545 Low back pain, unspecified: Secondary | ICD-10-CM | POA: Diagnosis not present

## 2021-04-01 DIAGNOSIS — R2681 Unsteadiness on feet: Secondary | ICD-10-CM | POA: Diagnosis not present

## 2021-04-01 DIAGNOSIS — M5459 Other low back pain: Secondary | ICD-10-CM | POA: Diagnosis not present

## 2021-04-01 DIAGNOSIS — M6281 Muscle weakness (generalized): Secondary | ICD-10-CM | POA: Diagnosis not present

## 2021-04-03 DIAGNOSIS — M5459 Other low back pain: Secondary | ICD-10-CM | POA: Diagnosis not present

## 2021-04-03 DIAGNOSIS — L821 Other seborrheic keratosis: Secondary | ICD-10-CM | POA: Diagnosis not present

## 2021-04-03 DIAGNOSIS — D1801 Hemangioma of skin and subcutaneous tissue: Secondary | ICD-10-CM | POA: Diagnosis not present

## 2021-04-03 DIAGNOSIS — M6281 Muscle weakness (generalized): Secondary | ICD-10-CM | POA: Diagnosis not present

## 2021-04-03 DIAGNOSIS — M545 Low back pain, unspecified: Secondary | ICD-10-CM | POA: Diagnosis not present

## 2021-04-03 DIAGNOSIS — R2681 Unsteadiness on feet: Secondary | ICD-10-CM | POA: Diagnosis not present

## 2021-04-03 DIAGNOSIS — R2689 Other abnormalities of gait and mobility: Secondary | ICD-10-CM | POA: Diagnosis not present

## 2021-04-03 NOTE — Chronic Care Management (AMB) (Signed)
  Care Management   Note  04/03/2021 Name: Kara [name] MRN: [id number] DOB: 09-28-1922  Kara [name] is a 87 y.o. year old female who is a primary care patient of Crecencio Mc, MD and is actively engaged with the care management team. I reached out to [name] by phone today to assist with re-scheduling a follow up visit with the RN Case Manager  Follow up plan: Unable to make contact on outreach attempts x 3. PCP Crecencio Mc, MD notified via routed documentation in medical record.   Noreene Larsson, [place], [place], [place] [address] Direct Dial: [phone] [email or web address] Website: [email or web address]

## 2021-04-03 NOTE — Telephone Encounter (Signed)
3rd unsuccessful outreach  

## 2021-04-08 DIAGNOSIS — M6281 Muscle weakness (generalized): Secondary | ICD-10-CM | POA: Diagnosis not present

## 2021-04-08 DIAGNOSIS — R2681 Unsteadiness on feet: Secondary | ICD-10-CM | POA: Diagnosis not present

## 2021-04-08 DIAGNOSIS — M5459 Other low back pain: Secondary | ICD-10-CM | POA: Diagnosis not present

## 2021-04-08 DIAGNOSIS — M545 Low back pain, unspecified: Secondary | ICD-10-CM | POA: Diagnosis not present

## 2021-04-08 DIAGNOSIS — R2689 Other abnormalities of gait and mobility: Secondary | ICD-10-CM | POA: Diagnosis not present

## 2021-04-10 DIAGNOSIS — M545 Low back pain, unspecified: Secondary | ICD-10-CM | POA: Diagnosis not present

## 2021-04-10 DIAGNOSIS — R2689 Other abnormalities of gait and mobility: Secondary | ICD-10-CM | POA: Diagnosis not present

## 2021-04-10 DIAGNOSIS — M5459 Other low back pain: Secondary | ICD-10-CM | POA: Diagnosis not present

## 2021-04-10 DIAGNOSIS — R2681 Unsteadiness on feet: Secondary | ICD-10-CM | POA: Diagnosis not present

## 2021-04-10 DIAGNOSIS — M6281 Muscle weakness (generalized): Secondary | ICD-10-CM | POA: Diagnosis not present

## 2021-04-15 ENCOUNTER — Other Ambulatory Visit: Payer: Self-pay | Admitting: Internal Medicine

## 2021-04-15 DIAGNOSIS — R2689 Other abnormalities of gait and mobility: Secondary | ICD-10-CM | POA: Diagnosis not present

## 2021-04-15 DIAGNOSIS — M6281 Muscle weakness (generalized): Secondary | ICD-10-CM | POA: Diagnosis not present

## 2021-04-15 DIAGNOSIS — M5459 Other low back pain: Secondary | ICD-10-CM | POA: Diagnosis not present

## 2021-04-15 DIAGNOSIS — M545 Low back pain, unspecified: Secondary | ICD-10-CM | POA: Diagnosis not present

## 2021-04-15 DIAGNOSIS — R2681 Unsteadiness on feet: Secondary | ICD-10-CM | POA: Diagnosis not present

## 2021-04-15 NOTE — Telephone Encounter (Signed)
Refilled: 02/12/2021 Last OV: 02/14/2021 Next OV: 08/14/2020

## 2021-04-17 DIAGNOSIS — R2689 Other abnormalities of gait and mobility: Secondary | ICD-10-CM | POA: Diagnosis not present

## 2021-04-17 DIAGNOSIS — M545 Low back pain, unspecified: Secondary | ICD-10-CM | POA: Diagnosis not present

## 2021-04-17 DIAGNOSIS — M5459 Other low back pain: Secondary | ICD-10-CM | POA: Diagnosis not present

## 2021-04-17 DIAGNOSIS — M6281 Muscle weakness (generalized): Secondary | ICD-10-CM | POA: Diagnosis not present

## 2021-04-17 DIAGNOSIS — R2681 Unsteadiness on feet: Secondary | ICD-10-CM | POA: Diagnosis not present

## 2021-04-19 DIAGNOSIS — M5416 Radiculopathy, lumbar region: Secondary | ICD-10-CM | POA: Diagnosis not present

## 2021-04-19 DIAGNOSIS — M5136 Other intervertebral disc degeneration, lumbar region: Secondary | ICD-10-CM | POA: Diagnosis not present

## 2021-04-19 DIAGNOSIS — M48062 Spinal stenosis, lumbar region with neurogenic claudication: Secondary | ICD-10-CM | POA: Diagnosis not present

## 2021-04-22 DIAGNOSIS — R2689 Other abnormalities of gait and mobility: Secondary | ICD-10-CM | POA: Diagnosis not present

## 2021-04-22 DIAGNOSIS — R2681 Unsteadiness on feet: Secondary | ICD-10-CM | POA: Diagnosis not present

## 2021-04-22 DIAGNOSIS — M6281 Muscle weakness (generalized): Secondary | ICD-10-CM | POA: Diagnosis not present

## 2021-04-22 DIAGNOSIS — M545 Low back pain, unspecified: Secondary | ICD-10-CM | POA: Diagnosis not present

## 2021-04-22 DIAGNOSIS — M5459 Other low back pain: Secondary | ICD-10-CM | POA: Diagnosis not present

## 2021-04-24 DIAGNOSIS — R2689 Other abnormalities of gait and mobility: Secondary | ICD-10-CM | POA: Diagnosis not present

## 2021-04-24 DIAGNOSIS — M545 Low back pain, unspecified: Secondary | ICD-10-CM | POA: Diagnosis not present

## 2021-04-24 DIAGNOSIS — R2681 Unsteadiness on feet: Secondary | ICD-10-CM | POA: Diagnosis not present

## 2021-04-24 DIAGNOSIS — M6281 Muscle weakness (generalized): Secondary | ICD-10-CM | POA: Diagnosis not present

## 2021-04-24 DIAGNOSIS — M5459 Other low back pain: Secondary | ICD-10-CM | POA: Diagnosis not present

## 2021-04-29 DIAGNOSIS — M6281 Muscle weakness (generalized): Secondary | ICD-10-CM | POA: Diagnosis not present

## 2021-04-29 DIAGNOSIS — M5459 Other low back pain: Secondary | ICD-10-CM | POA: Diagnosis not present

## 2021-04-29 DIAGNOSIS — R2681 Unsteadiness on feet: Secondary | ICD-10-CM | POA: Diagnosis not present

## 2021-04-29 DIAGNOSIS — M545 Low back pain, unspecified: Secondary | ICD-10-CM | POA: Diagnosis not present

## 2021-04-29 DIAGNOSIS — R2689 Other abnormalities of gait and mobility: Secondary | ICD-10-CM | POA: Diagnosis not present

## 2021-05-02 DIAGNOSIS — M545 Low back pain, unspecified: Secondary | ICD-10-CM | POA: Diagnosis not present

## 2021-05-02 DIAGNOSIS — M5459 Other low back pain: Secondary | ICD-10-CM | POA: Diagnosis not present

## 2021-05-02 DIAGNOSIS — R2689 Other abnormalities of gait and mobility: Secondary | ICD-10-CM | POA: Diagnosis not present

## 2021-05-02 DIAGNOSIS — R2681 Unsteadiness on feet: Secondary | ICD-10-CM | POA: Diagnosis not present

## 2021-05-02 DIAGNOSIS — M6281 Muscle weakness (generalized): Secondary | ICD-10-CM | POA: Diagnosis not present

## 2021-05-06 DIAGNOSIS — M6281 Muscle weakness (generalized): Secondary | ICD-10-CM | POA: Diagnosis not present

## 2021-05-06 DIAGNOSIS — R2681 Unsteadiness on feet: Secondary | ICD-10-CM | POA: Diagnosis not present

## 2021-05-06 DIAGNOSIS — M5459 Other low back pain: Secondary | ICD-10-CM | POA: Diagnosis not present

## 2021-05-06 DIAGNOSIS — R2689 Other abnormalities of gait and mobility: Secondary | ICD-10-CM | POA: Diagnosis not present

## 2021-05-06 DIAGNOSIS — M545 Low back pain, unspecified: Secondary | ICD-10-CM | POA: Diagnosis not present

## 2021-05-07 DIAGNOSIS — M5416 Radiculopathy, lumbar region: Secondary | ICD-10-CM | POA: Diagnosis not present

## 2021-05-07 DIAGNOSIS — M48062 Spinal stenosis, lumbar region with neurogenic claudication: Secondary | ICD-10-CM | POA: Diagnosis not present

## 2021-05-08 DIAGNOSIS — M545 Low back pain, unspecified: Secondary | ICD-10-CM | POA: Diagnosis not present

## 2021-05-08 DIAGNOSIS — M6281 Muscle weakness (generalized): Secondary | ICD-10-CM | POA: Diagnosis not present

## 2021-05-08 DIAGNOSIS — R2681 Unsteadiness on feet: Secondary | ICD-10-CM | POA: Diagnosis not present

## 2021-05-08 DIAGNOSIS — M5459 Other low back pain: Secondary | ICD-10-CM | POA: Diagnosis not present

## 2021-05-08 DIAGNOSIS — R2689 Other abnormalities of gait and mobility: Secondary | ICD-10-CM | POA: Diagnosis not present

## 2021-05-13 DIAGNOSIS — R2689 Other abnormalities of gait and mobility: Secondary | ICD-10-CM | POA: Diagnosis not present

## 2021-05-13 DIAGNOSIS — M6281 Muscle weakness (generalized): Secondary | ICD-10-CM | POA: Diagnosis not present

## 2021-05-13 DIAGNOSIS — R2681 Unsteadiness on feet: Secondary | ICD-10-CM | POA: Diagnosis not present

## 2021-05-13 DIAGNOSIS — M5459 Other low back pain: Secondary | ICD-10-CM | POA: Diagnosis not present

## 2021-05-13 DIAGNOSIS — M545 Low back pain, unspecified: Secondary | ICD-10-CM | POA: Diagnosis not present

## 2021-05-15 DIAGNOSIS — R2689 Other abnormalities of gait and mobility: Secondary | ICD-10-CM | POA: Diagnosis not present

## 2021-05-15 DIAGNOSIS — R2681 Unsteadiness on feet: Secondary | ICD-10-CM | POA: Diagnosis not present

## 2021-05-15 DIAGNOSIS — M545 Low back pain, unspecified: Secondary | ICD-10-CM | POA: Diagnosis not present

## 2021-05-15 DIAGNOSIS — M5459 Other low back pain: Secondary | ICD-10-CM | POA: Diagnosis not present

## 2021-05-15 DIAGNOSIS — M6281 Muscle weakness (generalized): Secondary | ICD-10-CM | POA: Diagnosis not present

## 2021-05-16 DIAGNOSIS — I4819 Other persistent atrial fibrillation: Secondary | ICD-10-CM | POA: Diagnosis not present

## 2021-05-16 DIAGNOSIS — E782 Mixed hyperlipidemia: Secondary | ICD-10-CM | POA: Diagnosis not present

## 2021-05-16 DIAGNOSIS — I2510 Atherosclerotic heart disease of native coronary artery without angina pectoris: Secondary | ICD-10-CM | POA: Diagnosis not present

## 2021-05-16 DIAGNOSIS — I340 Nonrheumatic mitral (valve) insufficiency: Secondary | ICD-10-CM | POA: Diagnosis not present

## 2021-05-16 DIAGNOSIS — I071 Rheumatic tricuspid insufficiency: Secondary | ICD-10-CM | POA: Diagnosis not present

## 2021-05-16 DIAGNOSIS — I10 Essential (primary) hypertension: Secondary | ICD-10-CM | POA: Diagnosis not present

## 2021-05-20 DIAGNOSIS — R2681 Unsteadiness on feet: Secondary | ICD-10-CM | POA: Diagnosis not present

## 2021-05-20 DIAGNOSIS — M6281 Muscle weakness (generalized): Secondary | ICD-10-CM | POA: Diagnosis not present

## 2021-05-20 DIAGNOSIS — M545 Low back pain, unspecified: Secondary | ICD-10-CM | POA: Diagnosis not present

## 2021-05-20 DIAGNOSIS — M5459 Other low back pain: Secondary | ICD-10-CM | POA: Diagnosis not present

## 2021-05-20 DIAGNOSIS — R2689 Other abnormalities of gait and mobility: Secondary | ICD-10-CM | POA: Diagnosis not present

## 2021-05-21 ENCOUNTER — Encounter: Payer: Self-pay | Admitting: Family

## 2021-05-21 ENCOUNTER — Ambulatory Visit (INDEPENDENT_AMBULATORY_CARE_PROVIDER_SITE_OTHER): Payer: Medicare Other | Admitting: Family

## 2021-05-21 ENCOUNTER — Other Ambulatory Visit: Payer: Self-pay

## 2021-05-21 VITALS — BP 118/78 | HR 92 | Ht 62.0 in | Wt 125.4 lb

## 2021-05-21 DIAGNOSIS — R350 Frequency of micturition: Secondary | ICD-10-CM | POA: Diagnosis not present

## 2021-05-21 DIAGNOSIS — A499 Bacterial infection, unspecified: Secondary | ICD-10-CM

## 2021-05-21 DIAGNOSIS — N390 Urinary tract infection, site not specified: Secondary | ICD-10-CM | POA: Diagnosis not present

## 2021-05-21 LAB — POCT URINALYSIS DIPSTICK
Bilirubin, UA: NEGATIVE
Blood, UA: NEGATIVE
Glucose, UA: NEGATIVE
Ketones, UA: NEGATIVE
Nitrite, UA: NEGATIVE
Protein, UA: POSITIVE — AB
Spec Grav, UA: 1.015 (ref 1.010–1.025)
Urobilinogen, UA: 0.2 E.U./dL
pH, UA: 6 (ref 5.0–8.0)

## 2021-05-21 MED ORDER — NITROFURANTOIN MONOHYD MACRO 100 MG PO CAPS: 100.0000 mg | ORAL_CAPSULE | Freq: Two times a day (BID) | ORAL | 0 refills | Status: DC

## 2021-05-21 NOTE — Progress Notes (Signed)
Acute Office Visit  Subjective:    Patient ID: Kara [name], female    DOB: 1922/05/29, 88 y.o.   MRN: [id number]  Chief Complaint  Patient presents with   Acute Visit    Urine frequency    HPI Patient is in today with c/o urinary frequency, bladder leakage, pelvic pain x last few weeks and worsening recently. She has not taken any new medications/ Does take cranberry pills daily to prevent UTIs which she believes helps.   Past Medical History:  Diagnosis Date   Allergy    Atrial fibrillation (HCC)    CAD (coronary artery disease)    Cancer (HCC)    UTERINE   Chronic anticoagulation    Chronic diarrhea    Closed compression fracture of L2 lumbar vertebra, initial encounter ([place]) 11/08/2020   By CT scan done to evaluate severe persistent low back pain:  Impression below also notes spinal compression from bulging disk.     Superior endplate compression fracture of L2 with approximately 25% height loss and minimal, 2-3 mm retropulsion of the superior endplate.   Left-sided, nondisplaced zone 1 sacral insufficiency fracture.   Multilevel degenerative changes of the lumbar spine resulting    Closed intertrochanteric fracture of left femur ([place]) 08/22/2020   Decubitus ulcer    sacral region   Diabetes ([place])    diet controlled   Dysuria    E. coli UTI 11/21/2014   Edema of lower extremity    mainly right foot, slightly in left foot.   Facial fracture due to fall ([place]) 07/13/2020   Femur fracture, left ([place]) 06/09/2019   Fibrocystic breast disease    GERD (gastroesophageal reflux disease)    Glaucoma    Glaucoma    Hematuria    Hemorrhoids    History of colon polyps    History of pancreatitis    Hospital discharge follow-up 07/22/2020   Hyperlipidemia    Hypertension    Hypokalemia    IBS (irritable bowel syndrome)    Microscopic hematuria    Mitral valve regurgitation    Orbital fracture ([place]) 07/13/2020   Osteoarthritis    fingers    Pernicious anemia    Plantar fasciitis    Recurrent UTI    Skin cancer    Sleep apnea, obstructive    Vaginal atrophy    Vitamin D deficiency    Yeast vaginitis     Past Surgical History:  Procedure Laterality Date   ABDOMINAL HYSTERECTOMY  1980's   ABDOMINAL SURGERY     for villous polyp,,,many years ago   APPENDECTOMY  1940's   ASCAD, s/p PTCA  11/28/2005   MID LESION    BREAST BIOPSY Left 1970's   CARPAL TUNNEL RELEASE Right 12/13/2015   Procedure: CARPAL TUNNEL RELEASE;  Surgeon: Thornton Park, MD;  Location: [facility]S;  Service: Orthopedics;  Laterality: Right;   COLECTOMY  2015   INTRAMEDULLARY (IM) NAIL INTERTROCHANTERIC Left 06/11/2019   Procedure: INTRAMEDULLARY (IM) NAIL INTERTROCHANTRIC;  Surgeon: Earnestine Leys, MD;  Location: [facility];  Service: Orthopedics;  Laterality: Left;   KYPHOPLASTY N/A 11/15/2020   Procedure: KYPHOPLASTY, L2;  Surgeon: Hessie Knows, MD;  Location: [facility];  Service: Orthopedics;  Laterality: N/A;   PACEMAKER PLACEMENT     radation     for uterine cance   REFRACTIVE SURGERY     for bilateral glaumoma   SACROPLASTY N/A 11/15/2020   Procedure: SACROPLASTY;  Surgeon: Hessie Knows, MD;  Location: [facility];  Service: Orthopedics;  Laterality: N/A;   TONSILLECTOMY  1936    Family History  Problem Relation Age of Onset   Coronary artery disease Father    Kidney disease Father    Cancer Sister    Diabetes Other    Cancer Mother        COLON   Bladder Cancer Neg Hx    Breast cancer Neg Hx     Social History   Socioeconomic History   Marital status: Widowed    Spouse name: Not on file   Number of children: 3   Years of education: Not on file   Highest education level: Not on file  Occupational History   Occupation: Retired Restaurant manager, fast food - primary  Tobacco Use   Smoking status: Former   Smokeless tobacco: Never   Tobacco comments:    quit 45 years ago  Vaping Use   Vaping Use: Never used   Substance and Sexual Activity   Alcohol use: Yes    Alcohol/week: 0.0 standard drinks    Comment: Rarely, 1-2 times a year   Drug use: No   Sexual activity: Not Currently    Birth control/protection: Surgical  Other Topics Concern   Not on file  Social History Narrative   ** Merged History Encounter **       Lives at [facility]. Born in [place]. Travels frequently.      1 daughter, 2 step children      Regular Exercise -  NO   Daily Caffeine Use:  1 coffee            Social Determinants of Health   Financial Resource Strain: Low Risk    Difficulty of Paying Living Expenses: Not hard at all  Food Insecurity: No Food Insecurity   Worried About Charity fundraiser in the Last Year: Never true   Ran Out of Food in the Last Year: Never true  Transportation Needs: No Transportation Needs   Lack of Transportation (Medical): No   Lack of Transportation (Non-Medical): No  Physical Activity: Not on file  Stress: No Stress Concern Present   Feeling of Stress : Not at all  Social Connections: Not on file  Intimate Partner Violence: Not At Risk   Fear of Current or Ex-Partner: No   Emotionally Abused: No   Physically Abused: No   Sexually Abused: No    Outpatient Medications Prior to Visit  Medication Sig Dispense Refill   acetaminophen (TYLENOL) 500 MG tablet Take 500 mg by mouth every 6 (six) hours as needed for mild pain or moderate pain.     cefdinir (OMNICEF) 300 MG capsule Take 1 capsule (300 mg total) by mouth 2 (two) times daily. 10 capsule 0   cyanocobalamin (,VITAMIN B-12,) 1000 MCG/ML injection INJECT 1ML INTO THE MUSCLE EVERY 14 DAYS 10 mL 0   diphenoxylate-atropine (LOMOTIL) 2.5-0.025 MG tablet TAKE 1 TABLET BY MOUTH THREE TIMES DAILYAS NEEDED FOR DIARRHEA 90 tablet 2   ergocalciferol (DRISDOL) 1.25 MG (50000 UT) capsule Take 1 capsule (50,000 Units total) by mouth once a week. 4 capsule 3   estradiol (ESTRACE) 0.1 MG/GM vaginal  cream Place vaginally.     Infant Care Products [facility]) OINT Apply 1 application topically as needed. 430 g 1   lidocaine (LIDODERM) 5 % Place 1 patch onto the skin daily. Remove & Discard patch within 12 hours or as directed by MD 30 patch 0   meloxicam (MOBIC) 7.5 MG tablet TAKE 1 TABLET BY  MOUTH DAILY 30 tablet 1   metaxalone (SKELAXIN) 800 MG tablet Take 400 mg by mouth 3 (three) times daily as needed for muscle spasms.     metoprolol succinate (TOPROL-XL) 25 MG 24 hr tablet TAKE 1 TABLET BY MOUTH IN THE MORNING AND AT BEDTIME 180 tablet 0   ondansetron (ZOFRAN) 4 MG tablet Take 1 tablet (4 mg total) by mouth every 6 (six) hours as needed for nausea. 20 tablet 0   traMADol (ULTRAM) 50 MG tablet Take 1 tablet (50 mg total) by mouth every 12 (twelve) hours as needed for moderate pain. 60 tablet 2   triamcinolone (KENALOG) 0.1 % APPLY 1 APPLICATION TOPICALLY 2 TIMES DAILY TO VULVA UNTIL ITCHING RESOLVES THEN REDUCE USE TO TWICE WEEKLY. 15 g 1   No facility-administered medications prior to visit.    Allergies  Allergen Reactions   Baycol [Cerivastatin Sodium]    Cardizem [Diltiazem Hcl]     LOWER EXTREMITY EDEMA   Crestor [Rosuvastatin Calcium] Other (See Comments)    INTOLERANT   Dronedarone Other (See Comments)    Fatigue   Metoprolol Tartrate Other (See Comments)   Omeprazole Other (See Comments)   Pravachol     INTOLERANT   Statins Other (See Comments)   Vioxx [Rofecoxib]    Zocor [Simvastatin]     INTOLERANT    Review of Systems  Constitutional:  Negative for fever.  Respiratory: Negative.    Cardiovascular: Negative.   Endocrine: Negative.   Genitourinary:  Positive for frequency and urgency. Negative for hematuria.  Musculoskeletal: Negative.   Neurological: Negative.   Psychiatric/Behavioral: Negative.    All other systems reviewed and are negative.     Objective:    Physical Exam Vitals and nursing note reviewed.  Constitutional:       Appearance: Normal appearance.  Cardiovascular:     Rate and Rhythm: Normal rate and regular rhythm.  Pulmonary:     Effort: Pulmonary effort is normal.     Breath sounds: Normal breath sounds.  Abdominal:     General: Abdomen is flat.     Palpations: Abdomen is soft.  Musculoskeletal:        General: Normal range of motion.     Cervical back: Normal range of motion and neck supple.  Skin:    General: Skin is warm and dry.  Neurological:     Mental Status: She is alert.  Psychiatric:        Mood and Affect: Mood normal.        Behavior: Behavior normal.   BP 118/78 (BP Location: Left Arm, Patient Position: Sitting, Cuff Size: Normal)    Pulse 92    Ht 5\' 2"  (1.575 m)    Wt 125 lb 6.4 oz (56.9 kg)    SpO2 95%    BMI 22.94 kg/m  Wt Readings from Last 3 Encounters:  05/21/21 125 lb 6.4 oz (56.9 kg)  02/14/21 122 lb 6.4 oz (55.5 kg)  01/03/21 120 lb 6.4 oz (54.6 kg)    Health Maintenance Due  Topic Date Due   Zoster Vaccines- Shingrix (1 of 2) Never done   COVID-19 Vaccine (3 - Pfizer risk series) 07/20/2019   OPHTHALMOLOGY EXAM  03/02/2021    There are no preventive care reminders to display for this patient.   Lab Results  Component Value Date   TSH 3.15 12/29/2019   Lab Results  Component Value Date   WBC 8.6 11/14/2020   HGB 13.7 11/14/2020   HCT 41.4  11/14/2020   MCV 93.0 11/14/2020   PLT 176 11/14/2020   Lab Results  Component Value Date   NA 140 02/14/2021   NA 140 02/14/2021   K 3.6 02/14/2021   K 3.6 02/14/2021   CO2 27 02/14/2021   CO2 27 02/14/2021   GLUCOSE 132 (H) 02/14/2021   GLUCOSE 132 (H) 02/14/2021   BUN 25 (H) 02/14/2021   BUN 25 (H) 02/14/2021   CREATININE 0.79 02/14/2021   CREATININE 0.79 02/14/2021   BILITOT 1.3 (H) 02/14/2021   ALKPHOS 63 02/14/2021   AST 15 02/14/2021   ALT 11 02/14/2021   PROT 6.4 02/14/2021   ALBUMIN 3.8 02/14/2021   ALBUMIN 3.8 02/14/2021   CALCIUM 9.1 02/14/2021   CALCIUM 9.1 02/14/2021   ANIONGAP 9  11/15/2020   GFR 63.15 02/14/2021   GFR 63.15 02/14/2021   Lab Results  Component Value Date   CHOL 142 10/11/2013   Lab Results  Component Value Date   HDL 43.10 10/11/2013   Lab Results  Component Value Date   LDLCALC 60 10/11/2013   Lab Results  Component Value Date   TRIG 194.0 (H) 10/11/2013   Lab Results  Component Value Date   CHOLHDL 3 10/11/2013   Lab Results  Component Value Date   HGBA1C 6.4 02/14/2021       Assessment & Plan:   Problem List Items Addressed This Visit   None Visit Diagnoses     Urine frequency    -  Primary   Relevant Orders   POCT Urinalysis Dipstick (Completed)   Urine Culture   Bacterial urinary infection       Relevant Medications   nitrofurantoin, macrocrystal-monohydrate, (MACROBID) 100 MG capsule   Other Relevant Orders   Urine Culture        Meds ordered this encounter  Medications   nitrofurantoin, macrocrystal-monohydrate, (MACROBID) 100 MG capsule    Sig: Take 1 capsule (100 mg total) by mouth 2 (two) times daily.    Dispense:  10 capsule    Refill:  0   Call the office if symtoms worsen or persist. Recheck as scheduled and sooner as needed. Will follow-up pending urine culture   Kennyth Arnold, FNP

## 2021-05-22 DIAGNOSIS — R2681 Unsteadiness on feet: Secondary | ICD-10-CM | POA: Diagnosis not present

## 2021-05-22 DIAGNOSIS — M545 Low back pain, unspecified: Secondary | ICD-10-CM | POA: Diagnosis not present

## 2021-05-22 DIAGNOSIS — M6281 Muscle weakness (generalized): Secondary | ICD-10-CM | POA: Diagnosis not present

## 2021-05-22 DIAGNOSIS — M5459 Other low back pain: Secondary | ICD-10-CM | POA: Diagnosis not present

## 2021-05-22 DIAGNOSIS — R2689 Other abnormalities of gait and mobility: Secondary | ICD-10-CM | POA: Diagnosis not present

## 2021-05-22 LAB — URINE CULTURE
MICRO NUMBER:: 12820669
Result:: NO GROWTH
SPECIMEN QUALITY:: ADEQUATE

## 2021-05-27 DIAGNOSIS — R2689 Other abnormalities of gait and mobility: Secondary | ICD-10-CM | POA: Diagnosis not present

## 2021-05-27 DIAGNOSIS — M545 Low back pain, unspecified: Secondary | ICD-10-CM | POA: Diagnosis not present

## 2021-05-27 DIAGNOSIS — M6281 Muscle weakness (generalized): Secondary | ICD-10-CM | POA: Diagnosis not present

## 2021-05-27 DIAGNOSIS — M5459 Other low back pain: Secondary | ICD-10-CM | POA: Diagnosis not present

## 2021-05-27 DIAGNOSIS — R2681 Unsteadiness on feet: Secondary | ICD-10-CM | POA: Diagnosis not present

## 2021-05-29 DIAGNOSIS — R2681 Unsteadiness on feet: Secondary | ICD-10-CM | POA: Diagnosis not present

## 2021-05-29 DIAGNOSIS — M545 Low back pain, unspecified: Secondary | ICD-10-CM | POA: Diagnosis not present

## 2021-05-29 DIAGNOSIS — M48062 Spinal stenosis, lumbar region with neurogenic claudication: Secondary | ICD-10-CM | POA: Diagnosis not present

## 2021-05-29 DIAGNOSIS — M48061 Spinal stenosis, lumbar region without neurogenic claudication: Secondary | ICD-10-CM | POA: Diagnosis not present

## 2021-05-29 DIAGNOSIS — R2689 Other abnormalities of gait and mobility: Secondary | ICD-10-CM | POA: Diagnosis not present

## 2021-05-29 DIAGNOSIS — M5136 Other intervertebral disc degeneration, lumbar region: Secondary | ICD-10-CM | POA: Diagnosis not present

## 2021-05-29 DIAGNOSIS — M6281 Muscle weakness (generalized): Secondary | ICD-10-CM | POA: Diagnosis not present

## 2021-05-29 DIAGNOSIS — M5416 Radiculopathy, lumbar region: Secondary | ICD-10-CM | POA: Diagnosis not present

## 2021-05-29 DIAGNOSIS — M5459 Other low back pain: Secondary | ICD-10-CM | POA: Diagnosis not present

## 2021-06-03 DIAGNOSIS — M6281 Muscle weakness (generalized): Secondary | ICD-10-CM | POA: Diagnosis not present

## 2021-06-03 DIAGNOSIS — M5459 Other low back pain: Secondary | ICD-10-CM | POA: Diagnosis not present

## 2021-06-03 DIAGNOSIS — R2681 Unsteadiness on feet: Secondary | ICD-10-CM | POA: Diagnosis not present

## 2021-06-03 DIAGNOSIS — M545 Low back pain, unspecified: Secondary | ICD-10-CM | POA: Diagnosis not present

## 2021-06-03 DIAGNOSIS — R2689 Other abnormalities of gait and mobility: Secondary | ICD-10-CM | POA: Diagnosis not present

## 2021-06-04 ENCOUNTER — Other Ambulatory Visit: Payer: Self-pay | Admitting: Internal Medicine

## 2021-06-05 DIAGNOSIS — R2689 Other abnormalities of gait and mobility: Secondary | ICD-10-CM | POA: Diagnosis not present

## 2021-06-05 DIAGNOSIS — M6281 Muscle weakness (generalized): Secondary | ICD-10-CM | POA: Diagnosis not present

## 2021-06-05 DIAGNOSIS — M545 Low back pain, unspecified: Secondary | ICD-10-CM | POA: Diagnosis not present

## 2021-06-05 DIAGNOSIS — R2681 Unsteadiness on feet: Secondary | ICD-10-CM | POA: Diagnosis not present

## 2021-06-05 DIAGNOSIS — M5459 Other low back pain: Secondary | ICD-10-CM | POA: Diagnosis not present

## 2021-06-10 DIAGNOSIS — R2681 Unsteadiness on feet: Secondary | ICD-10-CM | POA: Diagnosis not present

## 2021-06-10 DIAGNOSIS — M545 Low back pain, unspecified: Secondary | ICD-10-CM | POA: Diagnosis not present

## 2021-06-10 DIAGNOSIS — M6281 Muscle weakness (generalized): Secondary | ICD-10-CM | POA: Diagnosis not present

## 2021-06-10 DIAGNOSIS — R2689 Other abnormalities of gait and mobility: Secondary | ICD-10-CM | POA: Diagnosis not present

## 2021-06-10 DIAGNOSIS — M5459 Other low back pain: Secondary | ICD-10-CM | POA: Diagnosis not present

## 2021-06-12 DIAGNOSIS — M6281 Muscle weakness (generalized): Secondary | ICD-10-CM | POA: Diagnosis not present

## 2021-06-12 DIAGNOSIS — R2689 Other abnormalities of gait and mobility: Secondary | ICD-10-CM | POA: Diagnosis not present

## 2021-06-12 DIAGNOSIS — R2681 Unsteadiness on feet: Secondary | ICD-10-CM | POA: Diagnosis not present

## 2021-06-12 DIAGNOSIS — M545 Low back pain, unspecified: Secondary | ICD-10-CM | POA: Diagnosis not present

## 2021-06-12 DIAGNOSIS — M5459 Other low back pain: Secondary | ICD-10-CM | POA: Diagnosis not present

## 2021-06-17 DIAGNOSIS — R2681 Unsteadiness on feet: Secondary | ICD-10-CM | POA: Diagnosis not present

## 2021-06-17 DIAGNOSIS — M6281 Muscle weakness (generalized): Secondary | ICD-10-CM | POA: Diagnosis not present

## 2021-06-17 DIAGNOSIS — M5459 Other low back pain: Secondary | ICD-10-CM | POA: Diagnosis not present

## 2021-06-17 DIAGNOSIS — M545 Low back pain, unspecified: Secondary | ICD-10-CM | POA: Diagnosis not present

## 2021-06-17 DIAGNOSIS — R2689 Other abnormalities of gait and mobility: Secondary | ICD-10-CM | POA: Diagnosis not present

## 2021-06-18 ENCOUNTER — Other Ambulatory Visit: Payer: Self-pay | Admitting: Internal Medicine

## 2021-06-19 DIAGNOSIS — M6281 Muscle weakness (generalized): Secondary | ICD-10-CM | POA: Diagnosis not present

## 2021-06-19 DIAGNOSIS — R2689 Other abnormalities of gait and mobility: Secondary | ICD-10-CM | POA: Diagnosis not present

## 2021-06-19 DIAGNOSIS — R2681 Unsteadiness on feet: Secondary | ICD-10-CM | POA: Diagnosis not present

## 2021-06-19 DIAGNOSIS — M5459 Other low back pain: Secondary | ICD-10-CM | POA: Diagnosis not present

## 2021-06-19 DIAGNOSIS — M545 Low back pain, unspecified: Secondary | ICD-10-CM | POA: Diagnosis not present

## 2021-06-24 DIAGNOSIS — M6281 Muscle weakness (generalized): Secondary | ICD-10-CM | POA: Diagnosis not present

## 2021-06-24 DIAGNOSIS — R2689 Other abnormalities of gait and mobility: Secondary | ICD-10-CM | POA: Diagnosis not present

## 2021-06-24 DIAGNOSIS — M5459 Other low back pain: Secondary | ICD-10-CM | POA: Diagnosis not present

## 2021-06-24 DIAGNOSIS — M545 Low back pain, unspecified: Secondary | ICD-10-CM | POA: Diagnosis not present

## 2021-06-24 DIAGNOSIS — R2681 Unsteadiness on feet: Secondary | ICD-10-CM | POA: Diagnosis not present

## 2021-06-27 DIAGNOSIS — M545 Low back pain, unspecified: Secondary | ICD-10-CM | POA: Diagnosis not present

## 2021-06-27 DIAGNOSIS — R2689 Other abnormalities of gait and mobility: Secondary | ICD-10-CM | POA: Diagnosis not present

## 2021-06-27 DIAGNOSIS — M6281 Muscle weakness (generalized): Secondary | ICD-10-CM | POA: Diagnosis not present

## 2021-06-27 DIAGNOSIS — R2681 Unsteadiness on feet: Secondary | ICD-10-CM | POA: Diagnosis not present

## 2021-06-27 DIAGNOSIS — M5459 Other low back pain: Secondary | ICD-10-CM | POA: Diagnosis not present

## 2021-07-01 DIAGNOSIS — R2689 Other abnormalities of gait and mobility: Secondary | ICD-10-CM | POA: Diagnosis not present

## 2021-07-01 DIAGNOSIS — M545 Low back pain, unspecified: Secondary | ICD-10-CM | POA: Diagnosis not present

## 2021-07-01 DIAGNOSIS — M5459 Other low back pain: Secondary | ICD-10-CM | POA: Diagnosis not present

## 2021-07-01 DIAGNOSIS — M6281 Muscle weakness (generalized): Secondary | ICD-10-CM | POA: Diagnosis not present

## 2021-07-01 DIAGNOSIS — R2681 Unsteadiness on feet: Secondary | ICD-10-CM | POA: Diagnosis not present

## 2021-07-03 DIAGNOSIS — R2689 Other abnormalities of gait and mobility: Secondary | ICD-10-CM | POA: Diagnosis not present

## 2021-07-03 DIAGNOSIS — M5459 Other low back pain: Secondary | ICD-10-CM | POA: Diagnosis not present

## 2021-07-03 DIAGNOSIS — M545 Low back pain, unspecified: Secondary | ICD-10-CM | POA: Diagnosis not present

## 2021-07-03 DIAGNOSIS — M6281 Muscle weakness (generalized): Secondary | ICD-10-CM | POA: Diagnosis not present

## 2021-07-03 DIAGNOSIS — R2681 Unsteadiness on feet: Secondary | ICD-10-CM | POA: Diagnosis not present

## 2021-07-08 DIAGNOSIS — M5459 Other low back pain: Secondary | ICD-10-CM | POA: Diagnosis not present

## 2021-07-08 DIAGNOSIS — M545 Low back pain, unspecified: Secondary | ICD-10-CM | POA: Diagnosis not present

## 2021-07-08 DIAGNOSIS — R2689 Other abnormalities of gait and mobility: Secondary | ICD-10-CM | POA: Diagnosis not present

## 2021-07-08 DIAGNOSIS — M6281 Muscle weakness (generalized): Secondary | ICD-10-CM | POA: Diagnosis not present

## 2021-07-08 DIAGNOSIS — R2681 Unsteadiness on feet: Secondary | ICD-10-CM | POA: Diagnosis not present

## 2021-07-10 DIAGNOSIS — M6281 Muscle weakness (generalized): Secondary | ICD-10-CM | POA: Diagnosis not present

## 2021-07-10 DIAGNOSIS — M5459 Other low back pain: Secondary | ICD-10-CM | POA: Diagnosis not present

## 2021-07-10 DIAGNOSIS — M545 Low back pain, unspecified: Secondary | ICD-10-CM | POA: Diagnosis not present

## 2021-07-10 DIAGNOSIS — R2689 Other abnormalities of gait and mobility: Secondary | ICD-10-CM | POA: Diagnosis not present

## 2021-07-10 DIAGNOSIS — R2681 Unsteadiness on feet: Secondary | ICD-10-CM | POA: Diagnosis not present

## 2021-07-15 DIAGNOSIS — R2689 Other abnormalities of gait and mobility: Secondary | ICD-10-CM | POA: Diagnosis not present

## 2021-07-15 DIAGNOSIS — R2681 Unsteadiness on feet: Secondary | ICD-10-CM | POA: Diagnosis not present

## 2021-07-15 DIAGNOSIS — M5459 Other low back pain: Secondary | ICD-10-CM | POA: Diagnosis not present

## 2021-07-15 DIAGNOSIS — M545 Low back pain, unspecified: Secondary | ICD-10-CM | POA: Diagnosis not present

## 2021-07-15 DIAGNOSIS — M6281 Muscle weakness (generalized): Secondary | ICD-10-CM | POA: Diagnosis not present

## 2021-07-17 DIAGNOSIS — R2681 Unsteadiness on feet: Secondary | ICD-10-CM | POA: Diagnosis not present

## 2021-07-17 DIAGNOSIS — R2689 Other abnormalities of gait and mobility: Secondary | ICD-10-CM | POA: Diagnosis not present

## 2021-07-17 DIAGNOSIS — M6281 Muscle weakness (generalized): Secondary | ICD-10-CM | POA: Diagnosis not present

## 2021-07-17 DIAGNOSIS — M545 Low back pain, unspecified: Secondary | ICD-10-CM | POA: Diagnosis not present

## 2021-07-17 DIAGNOSIS — M5459 Other low back pain: Secondary | ICD-10-CM | POA: Diagnosis not present

## 2021-07-22 DIAGNOSIS — M6281 Muscle weakness (generalized): Secondary | ICD-10-CM | POA: Diagnosis not present

## 2021-07-22 DIAGNOSIS — M5459 Other low back pain: Secondary | ICD-10-CM | POA: Diagnosis not present

## 2021-07-22 DIAGNOSIS — M545 Low back pain, unspecified: Secondary | ICD-10-CM | POA: Diagnosis not present

## 2021-07-22 DIAGNOSIS — R2681 Unsteadiness on feet: Secondary | ICD-10-CM | POA: Diagnosis not present

## 2021-07-22 DIAGNOSIS — R2689 Other abnormalities of gait and mobility: Secondary | ICD-10-CM | POA: Diagnosis not present

## 2021-07-23 DIAGNOSIS — Z950 Presence of cardiac pacemaker: Secondary | ICD-10-CM | POA: Diagnosis not present

## 2021-07-24 DIAGNOSIS — M6281 Muscle weakness (generalized): Secondary | ICD-10-CM | POA: Diagnosis not present

## 2021-07-24 DIAGNOSIS — M5459 Other low back pain: Secondary | ICD-10-CM | POA: Diagnosis not present

## 2021-07-24 DIAGNOSIS — R2681 Unsteadiness on feet: Secondary | ICD-10-CM | POA: Diagnosis not present

## 2021-07-24 DIAGNOSIS — R2689 Other abnormalities of gait and mobility: Secondary | ICD-10-CM | POA: Diagnosis not present

## 2021-07-24 DIAGNOSIS — M545 Low back pain, unspecified: Secondary | ICD-10-CM | POA: Diagnosis not present

## 2021-07-29 DIAGNOSIS — M5459 Other low back pain: Secondary | ICD-10-CM | POA: Diagnosis not present

## 2021-07-29 DIAGNOSIS — M545 Low back pain, unspecified: Secondary | ICD-10-CM | POA: Diagnosis not present

## 2021-07-29 DIAGNOSIS — R2689 Other abnormalities of gait and mobility: Secondary | ICD-10-CM | POA: Diagnosis not present

## 2021-07-29 DIAGNOSIS — R2681 Unsteadiness on feet: Secondary | ICD-10-CM | POA: Diagnosis not present

## 2021-07-29 DIAGNOSIS — M6281 Muscle weakness (generalized): Secondary | ICD-10-CM | POA: Diagnosis not present

## 2021-07-30 ENCOUNTER — Encounter: Payer: Self-pay | Admitting: Internal Medicine

## 2021-07-30 ENCOUNTER — Ambulatory Visit (INDEPENDENT_AMBULATORY_CARE_PROVIDER_SITE_OTHER): Payer: Medicare Other | Admitting: Internal Medicine

## 2021-07-30 ENCOUNTER — Telehealth: Payer: Self-pay | Admitting: Internal Medicine

## 2021-07-30 DIAGNOSIS — N952 Postmenopausal atrophic vaginitis: Secondary | ICD-10-CM

## 2021-07-30 DIAGNOSIS — M545 Low back pain, unspecified: Secondary | ICD-10-CM

## 2021-07-30 DIAGNOSIS — R300 Dysuria: Secondary | ICD-10-CM

## 2021-07-30 MED ORDER — ESTRADIOL 0.1 MG/GM VA CREA: TOPICAL_CREAM | VAGINAL | 1 refills | Status: DC

## 2021-07-30 NOTE — Progress Notes (Signed)
Telephone  Note ? ?This visit type was conducted due to national recommendations for restrictions regarding the COVID-19 pandemic (e.g. social distancing).  This format is felt to be most appropriate for this patient at this time.  All issues noted in this document were discussed and addressed.  No physical exam was performed (except for noted visual exam findings with Video Visits).  ? ?I connected withNAME@ on 07/30/21 at  4:45 PM EDT by  telephone and verified that I am speaking with the correct person using two identifiers. ?Location patient: home ?Location provider: work or home office ?Persons participating in the virtual visit: patient, provider ? ?I discussed the limitations, risks, security and privacy concerns of performing an evaluation and management service by telephone and the availability of in person appointments. I also discussed with the patient that there may be a patient responsible charge related to this service. The patient expressed understanding and agreed to proceed. ? ?Reason for visit: chronic pain,  vaginal itching  urinary frequency  ? ?HPI: ? ?88 yr old female with osteoporosis  chronic pain from vertebral fractures,  chronic diarrhea, atropic vaginitis. Presents with  ? ?1) vaginal itching:  has not used estrace in quite a while. Just using triamcinolone.  No discharge or dysuria.  But having frequency. ? ?2) pain:  using tylenol prn.  Reviewed medications,  has meloxicam and tramadol, not using  ? ? ?ROS: See pertinent positives and negatives per HPI. ? ?Past Medical History:  ?Diagnosis Date  ? Allergy   ? Atrial fibrillation ([place])   ? CAD (coronary artery disease)   ? Cancer [facility])   ? UTERINE  ? Chronic anticoagulation   ? Chronic diarrhea   ? Closed compression fracture of L2 lumbar vertebra, initial encounter ([place]) 11/08/2020  ? By CT scan done to evaluate severe persistent low back pain:  Impression below also notes spinal compression from bulging disk.     Superior endplate  compression fracture of L2 with approximately 25% height loss and minimal, 2-3 mm retropulsion of the superior endplate.   Left-sided, nondisplaced zone 1 sacral insufficiency fracture.   Multilevel degenerative changes of the lumbar spine resulting   ? Closed intertrochanteric fracture of left femur ([place]) 08/22/2020  ? Decubitus ulcer   ? sacral region  ? Diabetes ([place])   ? diet controlled  ? Dysuria   ? E. coli UTI 11/21/2014  ? Edema of lower extremity   ? mainly right foot, slightly in left foot.  ? Facial fracture due to fall ([place]) 07/13/2020  ? Femur fracture, left ([place]) 06/09/2019  ? Fibrocystic breast disease   ? GERD (gastroesophageal reflux disease)   ? Glaucoma   ? Glaucoma   ? Hematuria   ? Hemorrhoids   ? History of colon polyps   ? History of pancreatitis   ? Hospital discharge follow-up 07/22/2020  ? Hyperlipidemia   ? Hypertension   ? Hypokalemia   ? IBS (irritable bowel syndrome)   ? Microscopic hematuria   ? Mitral valve regurgitation   ? Orbital fracture ([place]) 07/13/2020  ? Osteoarthritis   ? fingers  ? Pernicious anemia   ? Plantar fasciitis   ? Recurrent UTI   ? Skin cancer   ? Sleep apnea, obstructive   ? Vaginal atrophy   ? Vitamin D deficiency   ? Yeast vaginitis   ? ? ?Past Surgical History:  ?Procedure Laterality Date  ? ABDOMINAL HYSTERECTOMY  1980's  ? ABDOMINAL SURGERY    ? for  villous polyp,,,many years ago  ? APPENDECTOMY  1940's  ? ASCAD, s/p PTCA  11/28/2005  ? MID LESION   ? BREAST BIOPSY Left 1970's  ? CARPAL TUNNEL RELEASE Right 12/13/2015  ? Procedure: CARPAL TUNNEL RELEASE;  Surgeon: Thornton Park, MD;  Location: [facility] OR[facility];  Service: Orthopedics;  Laterality: Right;  ? COLECTOMY  2015  ? INTRAMEDULLARY (IM) NAIL INTERTROCHANTERIC Left 06/11/2019  ? Procedure: INTRAMEDULLARY (IM) NAIL INTERTROCHANTRIC;  Surgeon: Earnestine Leys, MD;  Location: [facility] OR[facility];  Service: Orthopedics;  Laterality: Left;  ? KYPHOPLASTY N/A 11/15/2020  ? Procedure: KYPHOPLASTY, L2;  Surgeon: Hessie Knows, MD;   Location: [facility];  Service: Orthopedics;  Laterality: N/A;  ? PACEMAKER PLACEMENT    ? radation    ? for uterine cance  ? REFRACTIVE SURGERY    ? for bilateral glaumoma  ? SACROPLASTY N/A 11/15/2020  ? Procedure: SACROPLASTY;  Surgeon: Hessie Knows, MD;  Location: [facility] OR[facility];  Service: Orthopedics;  Laterality: N/A;  ? TONSILLECTOMY  1936  ? ? ?Family History  ?Problem Relation Age of Onset  ? Coronary artery disease Father   ? Kidney disease Father   ? Cancer Sister   ? Diabetes Other   ? Cancer Mother   ?     COLON  ? Bladder Cancer Neg Hx   ? Breast cancer Neg Hx   ? ? ?SOCIAL HX:  reports that she has quit smoking. She has never used smokeless tobacco. She reports current alcohol use. She reports that she does not use drugs.  ? ? ?Current Outpatient Medications:  ?  acetaminophen (TYLENOL) 500 MG tablet, Take 500 mg by mouth every 6 (six) hours as needed for mild pain or moderate pain., Disp: , Rfl:  ?  Cholecalciferol (VITAMIN D) 50 MCG (2000 UT) CAPS, Take 1 capsule by mouth daily at 2 am., Disp: , Rfl:  ?  Cranberry 250 MG TABS, Take 2 tablets by mouth daily at 2 am., Disp: , Rfl:  ?  cyanocobalamin (,VITAMIN B-12,) 1000 MCG/ML injection, INJECT 1ML INTO THE MUSCLE EVERY 14 DAYS, Disp: 10 mL, Rfl: 0 ?  diphenoxylate-atropine (LOMOTIL) 2.5-0.025 MG tablet, TAKE 1 TABLET BY MOUTH THREE TIMES DAILYAS NEEDED FOR DIARRHEA, Disp: 90 tablet, Rfl: 2 ?  Infant Care Products (DERMACLOUD) OINT, Apply 1 application topically as needed., Disp: 430 g, Rfl: 1 ?  meloxicam (MOBIC) 7.5 MG tablet, TAKE 1 TABLET BY MOUTH DAILY, Disp: 30 tablet, Rfl: 1 ?  metaxalone (SKELAXIN) 800 MG tablet, Take 400 mg by mouth 3 (three) times daily as needed for muscle spasms., Disp: , Rfl:  ?  metoprolol succinate (TOPROL-XL) 25 MG 24 hr tablet, TAKE 1 TABLET BY MOUTH IN THE MORNING AND AT BEDTIME, Disp: 180 tablet, Rfl: 0 ?  ondansetron (ZOFRAN) 4 MG tablet, Take 1 tablet (4 mg total) by mouth every 6 (six) hours as needed for nausea., Disp:  20 tablet, Rfl: 0 ?  traMADol (ULTRAM) 50 MG tablet, Take 1 tablet (50 mg total) by mouth every 12 (twelve) hours as needed for moderate pain., Disp: 60 tablet, Rfl: 2 ?  triamcinolone (KENALOG) 0.1 %, APPLY 1 APPLICATION TOPICALLY 2 TIMES DAILY TO VULVA UNTIL ITCHING RESOLVES THEN REDUCE USE TO TWICE WEEKLY., Disp: 15 g, Rfl: 1 ?  estradiol (ESTRACE) 0.1 MG/GM vaginal cream, Place one applicator ful in the vagina nightly for  weeks,  then twice weekly thereafter, Disp: 42.5 g, Rfl: 1 ?  lidocaine (LIDODERM) 5 %, Place 1 patch onto the skin daily.  Remove & Discard patch within 12 hours or as directed by MD (Patient not taking: Reported on 07/30/2021), Disp: 30 patch, Rfl: 0 ? ?EXAM: ? ? ? ?General impression: alert, cooperative and articulate.  No signs of being in distress  ?Lungs: speech is fluent sentence length suggests that patient is not short of breath and not punctuated by cough, sneezing or sniffing. Marland Kitchen   ?Psych: affect normal.  speech is articulate and non pressured .  Denies suicidal thoughts   ? ? ?ASSESSMENT AND PLAN: ? ?Discussed the following assessment and plan: ? ?Atrophic vaginitis ? ?Low back pain, non-specific ? ?Atrophic vaginitis ?Has not been using estrace cream, only  triamcinolone.  Resume estrace nightly x 2 weeks,  Then 2/weekly thereafter.  ? ?Low back pain, non-specific ?Recommending a regimen of 100 mg tylenol bid,  meloxica 7.5 mg daily   And prn tramadol  ? ?I discussed the assessment and treatment plan with the patient. The patient was provided an opportunity to ask questions and all were answered. The patient agreed with the plan and demonstrated an understanding of the instructions. ?  ?The patient was advised to call back or seek an in-person evaluation if the symptoms worsen or if the condition fails to improve as anticipated. ? ? ?I provided 20 minutes of non-face-to-face time during this telephone encounter.  ? ?Crecencio Mc, MD   ?

## 2021-07-30 NOTE — Telephone Encounter (Signed)
Pt called in requesting for labs for urinalysis. Pt stated she would like to drop it off today. Pt stated she thinks she may have something wrong with her kidney. Looked in pt chart, no lab orders. Pt requesting callback.  ?

## 2021-07-30 NOTE — Patient Instructions (Addendum)
For your back and joint pain: ? ?You can take 2000 mg of acetominophen (tylenol) every day safely  In divided doses :   1000 mg every 12 hours.)  ? ?You can Add 7.5 mg meloxicam daily if needed. ? ?If the meloxicam and tylenol combination does not control your pain ,  you can tramadol  once daily  ? ? ?Please resume the estrogen cream for your vaginal itching .  Insert one applicator into your vagina every night for 2 weeks,  then reduce use to 2 times weekly thereafter  ?

## 2021-07-30 NOTE — Assessment & Plan Note (Addendum)
Has not been using estrace cream, only  triamcinolone.  Resume estrace nightly x 2 weeks,  Then 2/weekly thereafter.  ?

## 2021-07-30 NOTE — Assessment & Plan Note (Signed)
Recommending a regimen of 100 mg tylenol bid,  meloxica 7.5 mg daily   And prn tramadol  ?

## 2021-07-30 NOTE — Telephone Encounter (Signed)
Spoke with pt and she stated that she has started having vaginal itching again and the medication she was given in the past does not seem to be working right now. Pt has been scheduled for a telephone visit this afternoon at 4:45 pm and drop a urine off tomorrow.  ?

## 2021-07-31 ENCOUNTER — Other Ambulatory Visit (INDEPENDENT_AMBULATORY_CARE_PROVIDER_SITE_OTHER): Payer: Medicare Other

## 2021-07-31 ENCOUNTER — Telehealth: Payer: Self-pay | Admitting: Internal Medicine

## 2021-07-31 DIAGNOSIS — M6281 Muscle weakness (generalized): Secondary | ICD-10-CM | POA: Diagnosis not present

## 2021-07-31 DIAGNOSIS — M545 Low back pain, unspecified: Secondary | ICD-10-CM | POA: Diagnosis not present

## 2021-07-31 DIAGNOSIS — R300 Dysuria: Secondary | ICD-10-CM

## 2021-07-31 DIAGNOSIS — R2689 Other abnormalities of gait and mobility: Secondary | ICD-10-CM | POA: Diagnosis not present

## 2021-07-31 DIAGNOSIS — R2681 Unsteadiness on feet: Secondary | ICD-10-CM | POA: Diagnosis not present

## 2021-07-31 DIAGNOSIS — M5459 Other low back pain: Secondary | ICD-10-CM | POA: Diagnosis not present

## 2021-07-31 MED ORDER — ESTRADIOL 0.1 MG/GM VA CREA: TOPICAL_CREAM | VAGINAL | 1 refills | Status: DC

## 2021-07-31 NOTE — Telephone Encounter (Signed)
Directions have been corrected and setn to [facility] ? ?

## 2021-07-31 NOTE — Telephone Encounter (Signed)
Pharmacy called to get clarification on directions for estradiol ?

## 2021-08-01 LAB — URINALYSIS, ROUTINE W REFLEX MICROSCOPIC
Bilirubin Urine: NEGATIVE
Ketones, ur: NEGATIVE
Nitrite: NEGATIVE
Specific Gravity, Urine: 1.015 (ref 1.000–1.030)
Urine Glucose: NEGATIVE
Urobilinogen, UA: 0.2 (ref 0.0–1.0)
pH: 6 (ref 5.0–8.0)

## 2021-08-02 MED ORDER — CIPROFLOXACIN HCL 250 MG PO TABS: 250.0000 mg | ORAL_TABLET | Freq: Two times a day (BID) | ORAL | 0 refills | Status: AC

## 2021-08-02 NOTE — Addendum Note (Signed)
Addended by: Crecencio Mc on: 08/02/2021 01:12 PM ? ? Modules accepted: Orders ? ?

## 2021-08-03 LAB — URINE CULTURE
MICRO NUMBER:: 13134424
SPECIMEN QUALITY:: ADEQUATE

## 2021-08-05 DIAGNOSIS — H353131 Nonexudative age-related macular degeneration, bilateral, early dry stage: Secondary | ICD-10-CM | POA: Diagnosis not present

## 2021-08-07 DIAGNOSIS — R2681 Unsteadiness on feet: Secondary | ICD-10-CM | POA: Diagnosis not present

## 2021-08-07 DIAGNOSIS — M6281 Muscle weakness (generalized): Secondary | ICD-10-CM | POA: Diagnosis not present

## 2021-08-07 DIAGNOSIS — M545 Low back pain, unspecified: Secondary | ICD-10-CM | POA: Diagnosis not present

## 2021-08-07 DIAGNOSIS — M5459 Other low back pain: Secondary | ICD-10-CM | POA: Diagnosis not present

## 2021-08-07 DIAGNOSIS — R2689 Other abnormalities of gait and mobility: Secondary | ICD-10-CM | POA: Diagnosis not present

## 2021-08-09 ENCOUNTER — Telehealth: Payer: Self-pay | Admitting: Internal Medicine

## 2021-08-09 NOTE — Telephone Encounter (Signed)
Pt called in stating state she missed the call from our office... Pt stated that she never received her lab results... Pt stated that she will not be home after 1:30pm... Pt requesting callback ?

## 2021-08-12 DIAGNOSIS — R2681 Unsteadiness on feet: Secondary | ICD-10-CM | POA: Diagnosis not present

## 2021-08-12 DIAGNOSIS — M545 Low back pain, unspecified: Secondary | ICD-10-CM | POA: Diagnosis not present

## 2021-08-12 DIAGNOSIS — M6281 Muscle weakness (generalized): Secondary | ICD-10-CM | POA: Diagnosis not present

## 2021-08-12 DIAGNOSIS — R2689 Other abnormalities of gait and mobility: Secondary | ICD-10-CM | POA: Diagnosis not present

## 2021-08-12 DIAGNOSIS — M5459 Other low back pain: Secondary | ICD-10-CM | POA: Diagnosis not present

## 2021-08-14 ENCOUNTER — Other Ambulatory Visit: Payer: Self-pay

## 2021-08-14 ENCOUNTER — Ambulatory Visit: Payer: Medicare Other | Admitting: Internal Medicine

## 2021-08-14 ENCOUNTER — Encounter: Payer: Self-pay | Admitting: Internal Medicine

## 2021-08-14 ENCOUNTER — Ambulatory Visit (INDEPENDENT_AMBULATORY_CARE_PROVIDER_SITE_OTHER): Payer: Medicare Other | Admitting: Internal Medicine

## 2021-08-14 VITALS — BP 140/62 | HR 68 | Temp 97.6°F | Ht 62.0 in | Wt 128.2 lb

## 2021-08-14 DIAGNOSIS — N182 Chronic kidney disease, stage 2 (mild): Secondary | ICD-10-CM

## 2021-08-14 DIAGNOSIS — E1140 Type 2 diabetes mellitus with diabetic neuropathy, unspecified: Secondary | ICD-10-CM | POA: Diagnosis not present

## 2021-08-14 DIAGNOSIS — I10 Essential (primary) hypertension: Secondary | ICD-10-CM

## 2021-08-14 DIAGNOSIS — I495 Sick sinus syndrome: Secondary | ICD-10-CM

## 2021-08-14 DIAGNOSIS — L603 Nail dystrophy: Secondary | ICD-10-CM | POA: Diagnosis not present

## 2021-08-14 DIAGNOSIS — M79622 Pain in left upper arm: Secondary | ICD-10-CM | POA: Diagnosis not present

## 2021-08-14 DIAGNOSIS — E785 Hyperlipidemia, unspecified: Secondary | ICD-10-CM

## 2021-08-14 DIAGNOSIS — R1030 Lower abdominal pain, unspecified: Secondary | ICD-10-CM

## 2021-08-14 DIAGNOSIS — E1169 Type 2 diabetes mellitus with other specified complication: Secondary | ICD-10-CM | POA: Diagnosis not present

## 2021-08-14 DIAGNOSIS — R300 Dysuria: Secondary | ICD-10-CM

## 2021-08-14 DIAGNOSIS — D6869 Other thrombophilia: Secondary | ICD-10-CM

## 2021-08-14 MED ORDER — ESTRADIOL 0.1 MG/GM VA CREA: TOPICAL_CREAM | VAGINAL | 1 refills | Status: DC

## 2021-08-14 NOTE — Telephone Encounter (Signed)
Pt w/ appointment today ?

## 2021-08-14 NOTE — Patient Instructions (Signed)
You do not need to insert the estrace cream.  You can apply it to the outside of your urethra with your finger tip ? ?Use it every night for 2 weeks,,  followed by twice weekly thereafter ? ?You ca use [place] Patches with Lidocaine on your back and your left upper arm  every day  ? ? ?Referrals to Dr Bary Castilla for your abdominal pain and to  Dr Cleda Mccreedy ( [place] podiatrist)  BOTH ARE AT [facility]  ?

## 2021-08-14 NOTE — Progress Notes (Signed)
? ?Subjective:  ?Patient ID: Kara [name], female    DOB: 01-10-1923  Age: 88 y.o. MRN: [id number] ? ?CC: The primary encounter diagnosis was Essential hypertension. Diagnoses of Type 2 diabetes mellitus with diabetic neuropathy, without long-term current use of insulin ([place]), Dysuria, CKD (chronic kidney disease) stage 2, GFR 60-89 ml/min, Hyperlipidemia associated with type 2 diabetes mellitus ([place]), Lower abdominal pain, Dystrophic nail, SSS (sick sinus syndrome) ([place]), Acquired thrombophilia ([place]), and Pain in left upper arm were also pertinent to this visit. ? ? ?This visit occurred during the SARS-CoV-2 public health emergency.  Safety protocols were in place, including screening questions prior to the visit, additional usage of staff PPE, and extensive cleaning of exam room while observing appropriate contact time as indicated for disinfecting solutions.   ? ?HPI ?[name] presents for  follow up on UTI but once in room has multiple  complaints  ?Chief Complaint  ?Patient presents with  ? Follow-up  ?  39mof/u - pt w/ UTI 07/30/2021, feeling better now. Has not had urine retested. Pt also having a hard time using estrace cream. Pt also reports toenail on L foot coming out of the nailbed. Concerned about infection.  ? 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 ?1) dysuria :  symptoms have  improved significantly with treatment of Klebsiella UTI I nearly March. Still has Minor  dysuria which is chronic attributed  to atrophy.  She is not using estrace cream  correctly,  not using around the urethra,  trying to insert full applicator in vagina and painful due to vaginal stenosis  ?  ?2) history of ventral / incisional hernia  repair (?  Date unknown,  not available in Epic)  following intestinal  surgery,in 2013 ,    has chronic lower abdominal pain but for the last several weeks has felt more tender in the lower portion of her abdomen which she notices when washing..  no change In shoes, and nausea    ? ?3) left upper arm painful with movement.  Skins feels irritated In mid axillary line.  Trying to use deodorants without aluminum. History of fracture of humerus , has been painful every since, , with restricted ROM  ? ?4) recurrent problems with great toenail on  left foot , has been causing irritation of 2nd toe,  now the nail is lifting off from bed .  ?Outpatient Medications Prior to Visit  ?Medication Sig Dispense Refill  ? acetaminophen (TYLENOL) 500 MG tablet Take 500 mg by mouth every 6 (six) hours as needed for mild pain or moderate pain.    ? Cholecalciferol (VITAMIN D) 50 MCG (2000 UT) CAPS Take 1 capsule by mouth daily at 2 am.    ? Cranberry 250 MG TABS Take 2 tablets by mouth daily at 2 am.    ? cyanocobalamin (,VITAMIN B-12,) 1000 MCG/ML injection INJECT 1ML INTO THE MUSCLE EVERY 14 DAYS 10 mL 0  ? diphenoxylate-atropine (LOMOTIL) 2.5-0.025 MG tablet TAKE 1 TABLET BY MOUTH THREE TIMES DAILYAS NEEDED FOR DIARRHEA 90 tablet 2  ? Infant Care Products ([facility] OINT Apply 1 application topically as needed. 430 g 1  ? lidocaine (LIDODERM) 5 % Place 1 patch onto the skin daily. Remove & Discard patch within  12 hours or as directed by MD 30 patch 0  ? meloxicam (MOBIC) 7.5 MG tablet TAKE 1 TABLET BY MOUTH DAILY 30 tablet 1  ? metaxalone (SKELAXIN) 800 MG tablet Take 400 mg by mouth 3 (three) times daily as needed for muscle spasms.    ? metoprolol succinate (TOPROL-XL) 25 MG 24 hr tablet TAKE 1 TABLET BY MOUTH IN THE MORNING AND AT BEDTIME 180 tablet 0  ? ondansetron (ZOFRAN) 4 MG tablet Take 1 tablet (4 mg total) by mouth every 6 (six) hours as needed for nausea. 20 tablet 0  ? traMADol (ULTRAM) 50 MG tablet Take 1 tablet (50 mg total)  by mouth every 12 (twelve) hours as needed for moderate pain. 60 tablet 2  ? triamcinolone (KENALOG) 0.1 % APPLY 1 APPLICATION TOPICALLY 2 TIMES DAILY TO VULVA UNTIL ITCHING RESOLVES THEN REDUCE USE TO TWICE WEEKLY. 15 g 1  ? estradiol (ESTRACE) 0.1 MG/GM vaginal cream Place one applicator ful in the vagina nightly for 2  weeks,  then reduce use to  twice weekly thereafter 42.5 g 1  ? ?No facility-administered medications prior to visit.  ? ? ?Review of Systems; ? ?Patient denies headache, fevers, malaise, unintentional weight loss, skin rash, eye pain, sinus congestion and sinus pain, sore throat, dysphagia,  hemoptysis , cough, dyspnea, wheezing, chest pain, palpitations, orthopnea, edema, abdominal pain, nausea, melena,  flank pain, dysuria, hematuria, urinary  Frequency, nocturia, numbness, tingling, seizures,  Focal weakness, Loss of consciousness,  Tremor, insomnia, depression, anxiety, and suicidal ideation.   ? ? ? ?Objective:  ?BP 140/62 (BP Location: Left Arm, Patient Position: Sitting, Cuff Size: Small)   Pulse 68   Temp 97.6 ?F (36.4 ?C) (Oral)   Ht '5\' 2"'$  (1.575 m)   Wt 128 lb 3.2 oz (58.2 kg)   SpO2 95%   BMI 23.45 kg/m?  ? ?BP Readings from Last 3 Encounters:  ?08/14/21 140/62  ?05/21/21 118/78  ?02/14/21 138/68  ? ? ?Wt Readings from Last 3 Encounters:  ?08/14/21 128 lb 3.2 oz (58.2 kg)  ?07/30/21 125 lb (56.7 kg)  ?05/21/21 125 lb 6.4 oz (56.9 kg)  ? ? ?General appearance: alert, cooperative and appears stated age ?Ears: normal TM's and external ear canals both ears ?Throat: lips, mucosa, and tongue normal; teeth and gums normal ?Neck: no adenopathy, no carotid bruit, supple, symmetrical, trachea midline and thyroid not enlarged, symmetric, no tenderness/mass/nodules ?Back: symmetric, no curvature. ROM normal. No CVA tenderness. ?Lungs: clear to auscultation bilaterally ?Heart: regular rate and rhythm, S1, S2 normal, no murmur, click, rub or gallop ?Abdomen: soft, tender in the lower abdomen at  the inferior border her her incisional scar,  with thickening of skin and firmness ; bowel sounds normal; no masses,  no organomegaly ?Pulses: 2+ and symmetric ?Skin: Skin color, texture, turgor normal. No rashes or lesions ?Lymph nodes: Cervical, supraclavicular, and axillary nodes normal. ? ?Lab Results  ?Component Value Date  ? HGBA1C 6.4 02/14/2021  ? HGBA1C 7.2 (H) 11/14/2020  ? HGBA1C 6.3 (H) 07/14/2020  ? ? ?Lab Results  ?Component Value Date  ? CREATININE 0.79 02/14/2021  ? CREATININE 0.79 02/14/2021  ? CREATININE 0.64 11/15/2020  ? ? ?Lab Results  ?Component Value Date  ? WBC 8.6 11/14/2020  ? HGB 13.7 11/14/2020  ? HCT 41.4 11/14/2020  ? PLT 176 11/14/2020  ? GLUCOSE 132 (H) 02/14/2021  ? GLUCOSE 132 (H) 02/14/2021  ? CHOL 142 10/11/2013  ? TRIG 194.0 (H) 10/11/2013  ? HDL 43.10 10/11/2013  ?  LDLDIRECT 86.3 06/30/2012  ? [place] 60 10/11/2013  ? ALT 11 02/14/2021  ? AST 15 02/14/2021  ? NA 140 02/14/2021  ? NA 140 02/14/2021  ? K 3.6 02/14/2021  ? K 3.6 02/14/2021  ? CL 103 02/14/2021  ? CL 103 02/14/2021  ? CREATININE 0.79 02/14/2021  ? CREATININE 0.79 02/14/2021  ? BUN 25 (H) 02/14/2021  ? BUN 25 (H) 02/14/2021  ? CO2 27 02/14/2021  ? CO2 27 02/14/2021  ? TSH 3.15 12/29/2019  ? INR 1.1 07/13/2020  ? HGBA1C 6.4 02/14/2021  ? MICROALBUR 0.8 02/14/2021  ? ? ?NM Bone W/Spect ? ?Result Date: 11/14/2020 ?CLINICAL DATA:  Severe back pain EXAM: NM BONE SCAN AND SPECT IMAGING TECHNIQUE: After intravenous injection of radiopharmaceutical, delayed planar images were obtained in multiple projections. Additionally, delayed triplanar SPECT images were obtained through the area of interest. RADIOPHARMACEUTICALS:  21.9 mCi Tc-61mMDP COMPARISON:  None. FINDINGS: There is a medium size focus of intense radiotracer uptake localizing to the posterior and superior aspect of the L2 vertebral body. Here, there is a compression fracture with loss of approximately 25% of the vertebral body height as demonstrated on the CT from  11/06/2020. Asymmetric increased radiotracer uptake localizes to the nondisplaced left zone 1 sacral insufficiency fracture. Mild increased uptake at the L5-S1 level compatible with degenerative disc disease

## 2021-08-14 NOTE — Assessment & Plan Note (Signed)
Secondary to remote fracture.  Salon pas patches with lidocaine  ?

## 2021-08-14 NOTE — Assessment & Plan Note (Signed)
Secondary to atrial fibrillation and concurrent diabetes.  She has discontinued  Use of  Eliquis for embolic stroke risk mitigation since her vertebral fracture. Given her increased risk of falls and intermittent use of ESI for pain management ,  We have not resumed   ? ?Lab Results  ?Component Value Date  ? WBC 8.6 11/14/2020  ? HGB 13.7 11/14/2020  ? HCT 41.4 11/14/2020  ? MCV 93.0 11/14/2020  ? PLT 176 11/14/2020  ? ? ?

## 2021-08-14 NOTE — Assessment & Plan Note (Signed)
She has a history of an incisional hernia that was repaired by Dr Jamal Collin.  Her pain is in the inferior border of her midline surgical incision and concerning for small hernia.  Referral to general surgery for evaluation and conservative management if present would include a compression garment.  ?

## 2021-08-14 NOTE — Assessment & Plan Note (Signed)
Her diabetes remains well-controlled on diet alone .  hemoglobin A1c has been consistently at or  less than 7.0 .Patient is  up-to-date on eye exams.  She has no foot  callouses or ulcers and no evidence of  proteinuria. Patient is statin intolerant  and NOT on ACE/ARB for renal protection and hypertension  ? ?Lab Results  ?Component Value Date  ? HGBA1C 6.4 02/14/2021  ? ?Lab Results  ?Component Value Date  ? LABMICR See below: 05/27/2019  ? LABMICR See below: 11/21/2014  ? MICROALBUR 0.8 02/14/2021  ? MICROALBUR <0.7 07/02/2020  ? ? ? ? ?

## 2021-08-14 NOTE — Assessment & Plan Note (Signed)
S/p pacemaker in 2007.  She is asymptomatic ?

## 2021-08-15 ENCOUNTER — Other Ambulatory Visit: Payer: Self-pay | Admitting: Internal Medicine

## 2021-08-15 NOTE — Telephone Encounter (Signed)
Pt called about Salon Pas Patches [facility] ?

## 2021-08-19 NOTE — Telephone Encounter (Signed)
Pt would like a rx sent in for the salonpas patches to [facility] because they deliver her medications to her.  ?

## 2021-08-20 ENCOUNTER — Telehealth: Payer: Self-pay | Admitting: Internal Medicine

## 2021-08-20 MED ORDER — LIDOCAINE 4 % EX PTCH: MEDICATED_PATCH | CUTANEOUS | 1 refills | Status: DC

## 2021-08-20 NOTE — Telephone Encounter (Signed)
"  Rejection Reason - Patient did not respond" ?Kara [name] said on Aug 16, 2021 3:40 PM ? ?Msg from [facility] physiatry physical med and rehab ?

## 2021-08-27 DIAGNOSIS — R1030 Lower abdominal pain, unspecified: Secondary | ICD-10-CM | POA: Diagnosis not present

## 2021-08-29 DIAGNOSIS — M2012 Hallux valgus (acquired), left foot: Secondary | ICD-10-CM | POA: Diagnosis not present

## 2021-08-29 DIAGNOSIS — B351 Tinea unguium: Secondary | ICD-10-CM | POA: Diagnosis not present

## 2021-08-29 DIAGNOSIS — M79675 Pain in left toe(s): Secondary | ICD-10-CM | POA: Diagnosis not present

## 2021-08-29 DIAGNOSIS — M2011 Hallux valgus (acquired), right foot: Secondary | ICD-10-CM | POA: Diagnosis not present

## 2021-08-29 DIAGNOSIS — M2041 Other hammer toe(s) (acquired), right foot: Secondary | ICD-10-CM | POA: Diagnosis not present

## 2021-08-29 DIAGNOSIS — E1140 Type 2 diabetes mellitus with diabetic neuropathy, unspecified: Secondary | ICD-10-CM | POA: Diagnosis not present

## 2021-08-29 DIAGNOSIS — M79674 Pain in right toe(s): Secondary | ICD-10-CM | POA: Diagnosis not present

## 2021-08-29 DIAGNOSIS — M2042 Other hammer toe(s) (acquired), left foot: Secondary | ICD-10-CM | POA: Diagnosis not present

## 2021-08-29 DIAGNOSIS — L600 Ingrowing nail: Secondary | ICD-10-CM | POA: Diagnosis not present

## 2021-09-16 ENCOUNTER — Other Ambulatory Visit: Payer: Self-pay | Admitting: Internal Medicine

## 2021-09-27 DIAGNOSIS — M48061 Spinal stenosis, lumbar region without neurogenic claudication: Secondary | ICD-10-CM | POA: Diagnosis not present

## 2021-09-27 DIAGNOSIS — M5136 Other intervertebral disc degeneration, lumbar region: Secondary | ICD-10-CM | POA: Diagnosis not present

## 2021-09-27 DIAGNOSIS — M5416 Radiculopathy, lumbar region: Secondary | ICD-10-CM | POA: Diagnosis not present

## 2021-09-27 DIAGNOSIS — M48062 Spinal stenosis, lumbar region with neurogenic claudication: Secondary | ICD-10-CM | POA: Diagnosis not present

## 2021-10-08 ENCOUNTER — Telehealth: Payer: Self-pay | Admitting: Internal Medicine

## 2021-10-08 DIAGNOSIS — R300 Dysuria: Secondary | ICD-10-CM

## 2021-10-08 NOTE — Telephone Encounter (Signed)
I have placed orders for lab appt tomorrow and schedule a telephone appt for later in the week.

## 2021-10-08 NOTE — Telephone Encounter (Signed)
Pt called in stating she think she may have a UTI and want to drop off a specimen

## 2021-10-09 ENCOUNTER — Other Ambulatory Visit (INDEPENDENT_AMBULATORY_CARE_PROVIDER_SITE_OTHER): Payer: Medicare Other

## 2021-10-09 DIAGNOSIS — R300 Dysuria: Secondary | ICD-10-CM

## 2021-10-09 LAB — URINALYSIS, ROUTINE W REFLEX MICROSCOPIC
Bilirubin Urine: NEGATIVE
Ketones, ur: NEGATIVE
Nitrite: NEGATIVE
Specific Gravity, Urine: 1.01 (ref 1.000–1.030)
Total Protein, Urine: NEGATIVE
Urine Glucose: NEGATIVE
Urobilinogen, UA: 0.2 (ref 0.0–1.0)
pH: 6.5 (ref 5.0–8.0)

## 2021-10-10 ENCOUNTER — Ambulatory Visit (INDEPENDENT_AMBULATORY_CARE_PROVIDER_SITE_OTHER): Payer: Medicare Other | Admitting: Internal Medicine

## 2021-10-10 ENCOUNTER — Encounter: Payer: Self-pay | Admitting: Internal Medicine

## 2021-10-10 DIAGNOSIS — R5381 Other malaise: Secondary | ICD-10-CM

## 2021-10-10 DIAGNOSIS — R5383 Other fatigue: Secondary | ICD-10-CM

## 2021-10-10 LAB — URINE CULTURE
MICRO NUMBER:: 13439823
SPECIMEN QUALITY:: ADEQUATE

## 2021-10-10 NOTE — Progress Notes (Signed)
Telephone  Note  This visit type was conducted due to national recommendations for restrictions regarding the COVID-19 pandemic (e.g. social distancing).  This format is felt to be most appropriate for this patient at this time.  All issues noted in this document were discussed and addressed.  No physical exam was performed (except for noted visual exam findings with Video Visits).   I connected withNAME@ on 10/10/21 at 12:00 PM EDT by a  telephone and verified that I am speaking with the correct person using two identifiers. Location patient: home Location provider: work or home office Persons participating in the virtual visit: patient, provider  I discussed the limitations, risks, security and privacy concerns of performing an evaluation and management service by telephone and the availability of in person appointments. I also discussed with the patient that there may be a patient responsible charge related to this service. The patient expressed understanding and agreed to proceed.   Reason for visit: UTI symptoms   HPI:  88 yr old female with chronic diarrhea ,  recent Klebsiella  UTI in early March that began with malaise, fatigue and  hypersomnia   presents with same symptoms for the past 1-2 weeks.  Has been putting off calling but her daughter has insisted. Symptoms resolved after UTI in March ..     ROS: See pertinent positives and negatives per HPI.  Past Medical History:  Diagnosis Date   Allergy    Atrial fibrillation (HCC)    CAD (coronary artery disease)    Cancer (HCC)    UTERINE   Chronic anticoagulation    Chronic diarrhea    Closed compression fracture of L2 lumbar vertebra, initial encounter ([place]) 11/08/2020   By CT scan done to evaluate severe persistent low back pain:  Impression below also notes spinal compression from bulging disk.     Superior endplate compression fracture of L2 with approximately 25% height loss and minimal, 2-3 mm retropulsion of the superior  endplate.   Left-sided, nondisplaced zone 1 sacral insufficiency fracture.   Multilevel degenerative changes of the lumbar spine resulting    Closed intertrochanteric fracture of left femur ([place]) 08/22/2020   Decubitus ulcer    sacral region   Diabetes ([place])    diet controlled   Dysuria    E. coli UTI 11/21/2014   Edema of lower extremity    mainly right foot, slightly in left foot.   Facial fracture due to fall ([place]) 07/13/2020   Femur fracture, left ([place]) 06/09/2019   Fibrocystic breast disease    GERD (gastroesophageal reflux disease)    Glaucoma    Glaucoma    Hematuria    Hemorrhoids    History of colon polyps    History of pancreatitis    Hospital discharge follow-up 07/22/2020   Hyperlipidemia    Hypertension    Hypokalemia    IBS (irritable bowel syndrome)    Microscopic hematuria    Mitral valve regurgitation    Orbital fracture ([place]) 07/13/2020   Osteoarthritis    fingers   Pernicious anemia    Plantar fasciitis    Recurrent UTI    Skin cancer    Sleep apnea, obstructive    Umbilical hernia without obstruction and without gangrene 06/26/2015   Vaginal atrophy    Vitamin D deficiency    Yeast vaginitis     Past Surgical History:  Procedure Laterality Date   ABDOMINAL HYSTERECTOMY  1980's   ABDOMINAL SURGERY     for villous polyp,,,many years  ago   APPENDECTOMY  1940's   ASCAD, s/p PTCA  11/28/2005   MID LESION    BREAST BIOPSY Left 1970's   CARPAL TUNNEL RELEASE Right 12/13/2015   Procedure: CARPAL TUNNEL RELEASE;  Surgeon: Thornton Park, MD;  Location: [facility] ORS;  Service: Orthopedics;  Laterality: Right;   COLECTOMY  2015   INTRAMEDULLARY (IM) NAIL INTERTROCHANTERIC Left 06/11/2019   Procedure: INTRAMEDULLARY (IM) NAIL INTERTROCHANTRIC;  Surgeon: Earnestine Leys, MD;  Location: [facility] ORS;  Service: Orthopedics;  Laterality: Left;   KYPHOPLASTY N/A 11/15/2020   Procedure: KYPHOPLASTY, L2;  Surgeon: Hessie Knows, MD;  Location: [facility] ORS;  Service: Orthopedics;   Laterality: N/A;   PACEMAKER PLACEMENT     radation     for uterine cance   REFRACTIVE SURGERY     for bilateral glaumoma   SACROPLASTY N/A 11/15/2020   Procedure: SACROPLASTY;  Surgeon: Hessie Knows, MD;  Location: [facility] ORS;  Service: Orthopedics;  Laterality: N/A;   TONSILLECTOMY  1936    Family History  Problem Relation Age of Onset   Coronary artery disease Father    Kidney disease Father    Cancer Sister    Diabetes Other    Cancer Mother        COLON   Bladder Cancer Neg Hx    Breast cancer Neg Hx     SOCIAL HX:  reports that she has quit smoking. She has never used smokeless tobacco. She reports current alcohol use. She reports that she does not use drugs.    Current Outpatient Medications:    acetaminophen (TYLENOL) 500 MG tablet, Take 500 mg by mouth every 6 (six) hours as needed for mild pain or moderate pain., Disp: , Rfl:    Cholecalciferol (VITAMIN D) 50 MCG (2000 UT) CAPS, Take 1 capsule by mouth daily at 2 am., Disp: , Rfl:    Cranberry 250 MG TABS, Take 2 tablets by mouth daily at 2 am., Disp: , Rfl:    cyanocobalamin (,VITAMIN B-12,) 1000 MCG/ML injection, INJECT 1ML INTO THE MUSCLE EVERY 14 DAYS, Disp: 10 mL, Rfl: 0   diphenoxylate-atropine (LOMOTIL) 2.5-0.025 MG tablet, TAKE 1 TABLET BY MOUTH THREE TIMES DAILYAS NEEDED FOR DIARRHEA, Disp: 90 tablet, Rfl: 2   estradiol (ESTRACE) 0.1 MG/GM vaginal cream, Place a dab of cream around the urethra nightly for 2 weeks,  then twice weekly thereafter, Disp: 42.5 g, Rfl: 1   Infant Care Products ([employer]) OINT, Apply 1 application topically as needed., Disp: 430 g, Rfl: 1   Lidocaine (SALONPAS PAIN RELIEVING) 4 % PTCH, Apply 1 patch daily to your back and upper arm., Disp: 90 patch, Rfl: 1   meloxicam (MOBIC) 7.5 MG tablet, TAKE 1 TABLET BY MOUTH DAILY, Disp: 30 tablet, Rfl: 1   metaxalone (SKELAXIN) 800 MG tablet, Take 400 mg by mouth 3 (three) times daily as needed for muscle spasms., Disp: , Rfl:    metoprolol  succinate (TOPROL-XL) 25 MG 24 hr tablet, TAKE 1 TABLET BY MOUTH IN THE MORNING AND AT BEDTIME, Disp: 180 tablet, Rfl: 0   ondansetron (ZOFRAN) 4 MG tablet, Take 1 tablet (4 mg total) by mouth every 6 (six) hours as needed for nausea., Disp: 20 tablet, Rfl: 0   traMADol (ULTRAM) 50 MG tablet, Take 1 tablet (50 mg total) by mouth every 12 (twelve) hours as needed for moderate pain., Disp: 60 tablet, Rfl: 2   triamcinolone (KENALOG) 0.1 %, APPLY 1 APPLICATION TOPICALLY 2 TIMES DAILY TO VULVA UNTIL ITCHING RESOLVES THEN REDUCE  USE TO TWICE WEEKLY., Disp: 15 g, Rfl: 1  EXAM:   General impression: alert, cooperative and articulate.  No signs of being in distress  Lungs: speech is fluent sentence length suggests that patient is not short of breath and not punctuated by cough, sneezing or sniffing. Marland Kitchen   Psych: affect normal.  speech is articulate and non pressured .  Denies suicidal thoughts    ASSESSMENT AND PLAN:  Discussed the following assessment and plan:  Malaise and fatigue  Malaise and fatigue There is no evidence of UTI by UA and Urine culture obtained yesterday , since her March UTI presented with similar symptoms.  She has chronic diarrhea, chronic urinary frequency     I discussed the assessment and treatment plan with the patient. The patient was provided an opportunity to ask questions and all were answered. The patient agreed with the plan and demonstrated an understanding of the instructions.   The patient was advised to call back or seek an in-person evaluation if the symptoms worsen or if the condition fails to improve as anticipated.   I spent 15  minutes dedicated to the care of this patient on the date of this encounter to include pre-visit review of her medical history,  non Face-to-face time with the patient , and post visit ordering of testing and therapeutics.    Crecencio Mc, MD

## 2021-10-10 NOTE — Assessment & Plan Note (Addendum)
There is no evidence of UTI by UA and Urine culture obtained yesterday , since her March UTI presented with similar symptoms.  She has chronic diarrhea, chronic urinary frequency

## 2021-10-18 ENCOUNTER — Other Ambulatory Visit: Payer: Self-pay | Admitting: Internal Medicine

## 2021-11-06 ENCOUNTER — Ambulatory Visit: Payer: Self-pay | Admitting: *Deleted

## 2021-11-06 DIAGNOSIS — I4819 Other persistent atrial fibrillation: Secondary | ICD-10-CM | POA: Diagnosis not present

## 2021-11-06 DIAGNOSIS — I071 Rheumatic tricuspid insufficiency: Secondary | ICD-10-CM | POA: Diagnosis not present

## 2021-11-06 DIAGNOSIS — I272 Pulmonary hypertension, unspecified: Secondary | ICD-10-CM | POA: Diagnosis not present

## 2021-11-06 DIAGNOSIS — I2510 Atherosclerotic heart disease of native coronary artery without angina pectoris: Secondary | ICD-10-CM | POA: Diagnosis not present

## 2021-11-06 DIAGNOSIS — I10 Essential (primary) hypertension: Secondary | ICD-10-CM | POA: Diagnosis not present

## 2021-11-06 DIAGNOSIS — E782 Mixed hyperlipidemia: Secondary | ICD-10-CM | POA: Diagnosis not present

## 2021-11-06 DIAGNOSIS — R0602 Shortness of breath: Secondary | ICD-10-CM | POA: Diagnosis not present

## 2021-11-06 DIAGNOSIS — E1140 Type 2 diabetes mellitus with diabetic neuropathy, unspecified: Secondary | ICD-10-CM | POA: Diagnosis not present

## 2021-11-06 DIAGNOSIS — I700 Atherosclerosis of aorta: Secondary | ICD-10-CM | POA: Diagnosis not present

## 2021-11-06 DIAGNOSIS — I495 Sick sinus syndrome: Secondary | ICD-10-CM | POA: Diagnosis not present

## 2021-11-06 NOTE — Chronic Care Management (AMB) (Signed)
  Care Management   Follow Up Note   11/06/2021 Name: Kara [name] MRN: [id number] DOB: September 07, 1922   Referred by: Crecencio Mc, MD Reason for referral : No chief complaint on file.   Third unsuccessful telephone outreach was attempted today. The patient was referred to the case management team for assistance with care management and care coordination. The patient's primary care provider has been notified of our unsuccessful attempts to make or maintain contact with the patient. The care management team is pleased to engage with this patient at any time in the future should he/she be interested in assistance from the care management team.   Follow Up Plan: CLOSING DUE TO INABILITY TO [place] RN, MSN RN Care Management Coordinator [place] [phone] [email or web address]

## 2021-11-16 ENCOUNTER — Other Ambulatory Visit: Payer: Self-pay | Admitting: Internal Medicine

## 2021-11-18 ENCOUNTER — Encounter: Payer: Self-pay | Admitting: Internal Medicine

## 2021-11-18 ENCOUNTER — Ambulatory Visit (INDEPENDENT_AMBULATORY_CARE_PROVIDER_SITE_OTHER): Payer: Medicare Other | Admitting: Internal Medicine

## 2021-11-18 VITALS — BP 136/82 | HR 79 | Temp 98.0°F | Ht 62.0 in | Wt 128.4 lb

## 2021-11-18 DIAGNOSIS — I10 Essential (primary) hypertension: Secondary | ICD-10-CM

## 2021-11-18 DIAGNOSIS — I482 Chronic atrial fibrillation, unspecified: Secondary | ICD-10-CM

## 2021-11-18 DIAGNOSIS — I890 Lymphedema, not elsewhere classified: Secondary | ICD-10-CM

## 2021-11-18 DIAGNOSIS — E1169 Type 2 diabetes mellitus with other specified complication: Secondary | ICD-10-CM

## 2021-11-18 DIAGNOSIS — N182 Chronic kidney disease, stage 2 (mild): Secondary | ICD-10-CM | POA: Diagnosis not present

## 2021-11-18 DIAGNOSIS — R600 Localized edema: Secondary | ICD-10-CM

## 2021-11-18 DIAGNOSIS — I739 Peripheral vascular disease, unspecified: Secondary | ICD-10-CM

## 2021-11-18 DIAGNOSIS — E785 Hyperlipidemia, unspecified: Secondary | ICD-10-CM | POA: Diagnosis not present

## 2021-11-18 DIAGNOSIS — E1140 Type 2 diabetes mellitus with diabetic neuropathy, unspecified: Secondary | ICD-10-CM

## 2021-11-18 DIAGNOSIS — I272 Pulmonary hypertension, unspecified: Secondary | ICD-10-CM

## 2021-11-18 DIAGNOSIS — D6869 Other thrombophilia: Secondary | ICD-10-CM

## 2021-11-18 LAB — COMPREHENSIVE METABOLIC PANEL
ALT: 13 U/L (ref 0–35)
AST: 20 U/L (ref 0–37)
Albumin: 4 g/dL (ref 3.5–5.2)
Alkaline Phosphatase: 54 U/L (ref 39–117)
BUN: 24 mg/dL — ABNORMAL HIGH (ref 6–23)
CO2: 28 mEq/L (ref 19–32)
Calcium: 9.2 mg/dL (ref 8.4–10.5)
Chloride: 100 mEq/L (ref 96–112)
Creatinine, Ser: 0.9 mg/dL (ref 0.40–1.20)
GFR: 53.72 mL/min — ABNORMAL LOW (ref >60.00)
Glucose, Bld: 176 mg/dL — ABNORMAL HIGH (ref 70–99)
Potassium: 3.9 mEq/L (ref 3.5–5.1)
Sodium: 140 mEq/L (ref 135–145)
Total Bilirubin: 1.4 mg/dL — ABNORMAL HIGH (ref 0.2–1.2)
Total Protein: 6.4 g/dL (ref 6.0–8.3)

## 2021-11-18 LAB — CBC WITH DIFFERENTIAL/PLATELET
Basophils Absolute: 0 10*3/uL (ref 0.0–0.1)
Basophils Relative: 0.5 % (ref 0.0–3.0)
Eosinophils Absolute: 0.1 10*3/uL (ref 0.0–0.7)
Eosinophils Relative: 0.9 % (ref 0.0–5.0)
HCT: 41.7 % (ref 36.0–46.0)
Hemoglobin: 13.9 g/dL (ref 12.0–15.0)
Lymphocytes Relative: 42 % (ref 12.0–46.0)
Lymphs Abs: 2.9 10*3/uL (ref 0.7–4.0)
MCHC: 33.3 g/dL (ref 30.0–36.0)
MCV: 96.1 fl (ref 78.0–100.0)
Monocytes Absolute: 0.4 10*3/uL (ref 0.1–1.0)
Monocytes Relative: 5.7 % (ref 3.0–12.0)
Neutro Abs: 3.5 10*3/uL (ref 1.4–7.7)
Neutrophils Relative %: 50.9 % (ref 43.0–77.0)
Platelets: 136 10*3/uL — ABNORMAL LOW (ref 150.0–400.0)
RBC: 4.34 Mil/uL (ref 3.87–5.11)
RDW: 13 % (ref 11.5–15.5)
WBC: 7 10*3/uL (ref 4.0–10.5)

## 2021-11-18 LAB — LIPID PANEL
Cholesterol: 160 mg/dL (ref 0–200)
HDL: 48.7 mg/dL (ref >39.00)
NonHDL: 111.33
Total CHOL/HDL Ratio: 3
Triglycerides: 264 mg/dL — ABNORMAL HIGH (ref 0.0–149.0)
VLDL: 52.8 mg/dL — ABNORMAL HIGH (ref 0.0–40.0)

## 2021-11-18 LAB — HEMOGLOBIN A1C: Hgb A1c MFr Bld: 6.8 % — ABNORMAL HIGH (ref 4.6–6.5)

## 2021-11-18 LAB — LDL CHOLESTEROL, DIRECT: Direct LDL: 74 mg/dL

## 2021-11-18 MED ORDER — DICLOFENAC SODIUM 1 % EX GEL: 2.0000 g | Freq: Four times a day (QID) | CUTANEOUS | 5 refills | Status: DC

## 2021-11-18 MED ORDER — ACETAMINOPHEN 500 MG PO TABS: 1000.0000 mg | ORAL_TABLET | Freq: Two times a day (BID) | ORAL | 11 refills | Status: DC

## 2021-11-18 MED ORDER — TRAMADOL HCL 50 MG PO TABS: 50.0000 mg | ORAL_TABLET | Freq: Two times a day (BID) | ORAL | 2 refills | Status: DC | PRN

## 2021-11-18 NOTE — Progress Notes (Unsigned)
Subjective:  Patient ID: Kara [name], female    DOB: [birth year]  Age: 88 y.o. MRN: [id number]  CC: There were no encounter diagnoses.   HPI Kara [name] presents for follow up on chronic issues.  Last seen May 25 Chief Complaint  Patient presents with   Follow-up    3 month follow up    1) type 2 DM with peripheral neuropathy managed    Outpatient Medications Prior to Visit  Medication Sig Dispense Refill   acetaminophen (TYLENOL) 500 MG tablet Take 500 mg by mouth every 6 (six) hours as needed for mild pain or moderate pain.     Cholecalciferol (VITAMIN D) 50 MCG (2000 UT) CAPS Take 1 capsule by mouth daily at 2 am.     Cranberry 250 MG TABS Take 2 tablets by mouth daily at 2 am.     cyanocobalamin (,VITAMIN B-12,) 1000 MCG/ML injection INJECT 1ML INTO THE MUSCLE EVERY 14 DAYS 10 mL 0   diphenoxylate-atropine (LOMOTIL) 2.5-0.025 MG tablet TAKE 1 TABLET BY MOUTH THREE TIMES DAILYAS NEEDED FOR DIARRHEA 90 tablet 2   estradiol (ESTRACE) 0.1 MG/GM vaginal cream Place a dab of cream around the urethra nightly for 2 weeks,  then twice weekly thereafter 42.5 g 1   Infant Care Products (DERMACLOUD) OINT Apply 1 application topically as needed. 430 g 1   Lidocaine (SALONPAS PAIN RELIEVING) 4 % PTCH Apply 1 patch daily to your back and upper arm. 90 patch 1   meloxicam (MOBIC) 7.5 MG tablet TAKE 1 TABLET BY MOUTH DAILY 30 tablet 1   metaxalone (SKELAXIN) 800 MG tablet Take 400 mg by mouth 3 (three) times daily as needed for muscle spasms.     metoprolol succinate (TOPROL-XL) 25 MG 24 hr tablet TAKE 1 TABLET BY MOUTH IN THE MORNING AND AT BEDTIME 180 tablet 0   ondansetron (ZOFRAN) 4 MG tablet Take 1 tablet (4 mg total) by mouth every 6 (six) hours as needed for nausea. 20 tablet 0   traMADol (ULTRAM) 50 MG tablet Take 1 tablet (50 mg total) by mouth every 12 (twelve) hours as needed for moderate pain. 60 tablet 2   triamcinolone (KENALOG) 0.1 % APPLY 1 APPLICATION TOPICALLY 2 TIMES DAILY TO  VULVA UNTIL ITCHING RESOLVES THEN REDUCE USE TO TWICE WEEKLY. 15 g 1   No facility-administered medications prior to visit.    Review of Systems;  Patient denies headache, fevers, malaise, unintentional weight loss, skin rash, eye pain, sinus congestion and sinus pain, sore throat, dysphagia,  hemoptysis , cough, dyspnea, wheezing, chest pain, palpitations, orthopnea, edema, abdominal pain, nausea, melena, diarrhea, constipation, flank pain, dysuria, hematuria, urinary  Frequency, nocturia, numbness, tingling, seizures,  Focal weakness, Loss of consciousness,  Tremor, insomnia, depression, anxiety, and suicidal ideation.      Objective:  BP 136/82 (BP Location: Left Arm, Patient Position: Sitting, Cuff Size: Normal)   Pulse 79   Temp 98 F (36.7 C) (Oral)   Ht '5\' 2"'$  (1.575 m)   Wt 128 lb 6.4 oz (58.2 kg)   SpO2 96%   BMI 23.48 kg/m   BP Readings from Last 3 Encounters:  11/18/21 136/82  08/14/21 140/62  05/21/21 118/78    Wt Readings from Last 3 Encounters:  11/18/21 128 lb 6.4 oz (58.2 kg)  10/10/21 128 lb (58.1 kg)  08/14/21 128 lb 3.2 oz (58.2 kg)    General appearance: alert, cooperative and appears stated age Ears: normal TM's and external ear canals both ears  Throat: lips, mucosa, and tongue normal; teeth and gums normal Neck: no adenopathy, no carotid bruit, supple, symmetrical, trachea midline and thyroid not enlarged, symmetric, no tenderness/mass/nodules Back: symmetric, no curvature. ROM normal. No CVA tenderness. Lungs: clear to auscultation bilaterally Heart: regular rate and rhythm, S1, S2 normal, no murmur, click, rub or gallop Abdomen: soft, non-tender; bowel sounds normal; no masses,  no organomegaly Pulses: 2+ and symmetric Skin: Skin color, texture, turgor normal. No rashes or lesions Lymph nodes: Cervical, supraclavicular, and axillary nodes normal.  Lab Results  Component Value Date   HGBA1C 6.4 02/14/2021   HGBA1C 7.2 (H) 11/14/2020   HGBA1C 6.3  (H) 07/14/2020    Lab Results  Component Value Date   CREATININE 0.79 02/14/2021   CREATININE 0.79 02/14/2021   CREATININE 0.64 11/15/2020    Lab Results  Component Value Date   WBC 8.6 11/14/2020   HGB 13.7 11/14/2020   HCT 41.4 11/14/2020   PLT 176 11/14/2020   GLUCOSE 132 (H) 02/14/2021   GLUCOSE 132 (H) 02/14/2021   CHOL 142 10/11/2013   TRIG 194.0 (H) 10/11/2013   HDL 43.10 10/11/2013   LDLDIRECT 86.3 06/30/2012   LDLCALC 60 10/11/2013   ALT 11 02/14/2021   AST 15 02/14/2021   NA 140 02/14/2021   NA 140 02/14/2021   K 3.6 02/14/2021   K 3.6 02/14/2021   CL 103 02/14/2021   CL 103 02/14/2021   CREATININE 0.79 02/14/2021   CREATININE 0.79 02/14/2021   BUN 25 (H) 02/14/2021   BUN 25 (H) 02/14/2021   CO2 27 02/14/2021   CO2 27 02/14/2021   TSH 3.15 12/29/2019   INR 1.1 07/13/2020   HGBA1C 6.4 02/14/2021   MICROALBUR 0.8 02/14/2021    NM Bone W/Spect  Result Date: 11/14/2020 CLINICAL DATA:  Severe back pain EXAM: NM BONE SCAN AND SPECT IMAGING TECHNIQUE: After intravenous injection of radiopharmaceutical, delayed planar images were obtained in multiple projections. Additionally, delayed triplanar SPECT images were obtained through the area of interest. RADIOPHARMACEUTICALS:  21.9 mCi Tc-26mMDP COMPARISON:  None. FINDINGS: There is a medium size focus of intense radiotracer uptake localizing to the posterior and superior aspect of the L2 vertebral body. Here, there is a compression fracture with loss of approximately 25% of the vertebral body height as demonstrated on the CT from 11/06/2020. Asymmetric increased radiotracer uptake localizes to the nondisplaced left zone 1 sacral insufficiency fracture. Mild increased uptake at the L5-S1 level compatible with degenerative disc disease. Other: On the CT portion of the exam there is extensive aortic atherosclerosis. Calcified gallstones identified within the gallbladder. Midline infraumbilical ventral abdominal wall hernia  contains fat only. IMPRESSION: 1. There is intense radiotracer uptake localizing to the superior endplate fracture deformity involving the L2 vertebral body compatible with acute to subacute fracture. 2. Mild asymmetric increased uptake localizes to the left sacral wing insufficiency fracture demonstrated on previous CT. Electronically Signed   By: TKerby MoorsM.D.   On: 11/14/2020 15:44    Assessment & Plan:   Problem List Items Addressed This Visit   None   I spent a total of   minutes with this patient in a face to face visit on the date of this encounter reviewing the last office visit with me on        ,  most recent with patient's cardiologist in    ,  patient'ss diet and eating habits, home blood pressure readings ,  most recent imaging study ,   and  post visit ordering of testing and therapeutics.    Follow-up: No follow-ups on file.   Crecencio Mc, MD

## 2021-11-18 NOTE — Assessment & Plan Note (Signed)
She appears to have developed lymphedema of the right leg and cannot manage compression stockings.  Referral to [facility] for evaluation

## 2021-11-18 NOTE — Patient Instructions (Addendum)
You should take  1000 mg of tylenol every 12 hours AND the tramadol  twice daily if needed for additional pain control.     Meloxicam can make your legs swell .  You can use the diclofenac cream instead  on joints that you can feel the bone  (shoulder, spine ,  not the hip)    Referral to Dr Lucky Cowboy to help devised a way to "pump" the fluid out of the right leg   Elevate your legs whenever possible

## 2021-11-19 NOTE — Assessment & Plan Note (Signed)
Secondary to atrial fibrillation and concurrent diabetes.  She is not anticoagulated and remains at risk for embolic stroke,  But has a higher risk of SAH due to history of fall.. with vertebral fracture. Given her increased risk of falls and intermittent vertebral fracture   Lab Results  Component Value Date   WBC 7.0 11/18/2021   HGB 13.9 11/18/2021   HCT 41.7 11/18/2021   MCV 96.1 11/18/2021   PLT 136.0 (L) 11/18/2021

## 2021-11-19 NOTE — Assessment & Plan Note (Addendum)
Annual surveillance with ABI's was last done  In july 2021 by  Santiago Glad.  Her left ABI had improved  From 0.49 to 0.69  She is asymptomatic

## 2021-11-19 NOTE — Assessment & Plan Note (Signed)
Rate control managed with low dose  metoprolol .  She has discontinued Eliquis due to history of fall with vertebral fracture

## 2021-11-19 NOTE — Assessment & Plan Note (Addendum)
Her diabetes remains well-controlled on diet alone .  hemoglobin A1c has been consistently at or  less than 7.0 .Patient is  up-to-date on eye exams.  She has no foot  callouses and despite PAD has no  ulcers and no evidence of  proteinuria. Patient is statin intolerant; LDL is at goal without medication.    and NOT on ACE/ARB for renal protection and hypertension   Lab Results  Component Value Date   HGBA1C 6.8 (H) 11/18/2021   Lab Results  Component Value Date   LABMICR See below: 05/27/2019   LABMICR See below: 11/21/2014   MICROALBUR 0.8 02/14/2021   MICROALBUR <0.7 07/02/2020    Lab Results  Component Value Date   CHOL 160 11/18/2021   HDL 48.70 11/18/2021   LDLCALC 60 10/11/2013   LDLDIRECT 74.0 11/18/2021   TRIG 264.0 (H) 11/18/2021   CHOLHDL 3 11/18/2021

## 2021-11-19 NOTE — Assessment & Plan Note (Signed)
She remains asymptomatic due to her sedentary nature.

## 2021-11-26 DIAGNOSIS — I071 Rheumatic tricuspid insufficiency: Secondary | ICD-10-CM | POA: Diagnosis not present

## 2021-11-26 DIAGNOSIS — I4819 Other persistent atrial fibrillation: Secondary | ICD-10-CM | POA: Diagnosis not present

## 2021-12-27 DIAGNOSIS — I2510 Atherosclerotic heart disease of native coronary artery without angina pectoris: Secondary | ICD-10-CM | POA: Diagnosis not present

## 2021-12-27 DIAGNOSIS — I071 Rheumatic tricuspid insufficiency: Secondary | ICD-10-CM | POA: Diagnosis not present

## 2021-12-27 DIAGNOSIS — I495 Sick sinus syndrome: Secondary | ICD-10-CM | POA: Diagnosis not present

## 2021-12-27 DIAGNOSIS — I340 Nonrheumatic mitral (valve) insufficiency: Secondary | ICD-10-CM | POA: Diagnosis not present

## 2021-12-27 DIAGNOSIS — I10 Essential (primary) hypertension: Secondary | ICD-10-CM | POA: Diagnosis not present

## 2022-01-07 ENCOUNTER — Ambulatory Visit (INDEPENDENT_AMBULATORY_CARE_PROVIDER_SITE_OTHER): Payer: Medicare Other

## 2022-01-07 ENCOUNTER — Encounter (INDEPENDENT_AMBULATORY_CARE_PROVIDER_SITE_OTHER): Payer: Self-pay | Admitting: Vascular Surgery

## 2022-01-07 ENCOUNTER — Ambulatory Visit (INDEPENDENT_AMBULATORY_CARE_PROVIDER_SITE_OTHER): Payer: Medicare Other | Admitting: Vascular Surgery

## 2022-01-07 ENCOUNTER — Other Ambulatory Visit (INDEPENDENT_AMBULATORY_CARE_PROVIDER_SITE_OTHER): Payer: Self-pay | Admitting: Vascular Surgery

## 2022-01-07 VITALS — BP 178/73 | HR 90 | Resp 18 | Ht 62.0 in | Wt 126.0 lb

## 2022-01-07 DIAGNOSIS — I482 Chronic atrial fibrillation, unspecified: Secondary | ICD-10-CM

## 2022-01-07 DIAGNOSIS — E1140 Type 2 diabetes mellitus with diabetic neuropathy, unspecified: Secondary | ICD-10-CM

## 2022-01-07 DIAGNOSIS — M7989 Other specified soft tissue disorders: Secondary | ICD-10-CM | POA: Diagnosis not present

## 2022-01-07 DIAGNOSIS — I10 Essential (primary) hypertension: Secondary | ICD-10-CM

## 2022-01-07 DIAGNOSIS — I739 Peripheral vascular disease, unspecified: Secondary | ICD-10-CM

## 2022-01-07 NOTE — Assessment & Plan Note (Signed)
Patient has worsening swelling particularly on the right lower extremity.  This has been a progressive problem after her hip fracture and she is almost certainly developed lymphedema from chronic swelling, immobility, and chronic scarring and lymphatic channels.  This would be stage II lymphedema with swelling refractory to conservative measures.  I think it is reasonable to check a venous reflux study to ensure that venous insufficiency is not present.  I think she will benefit from a lymphedema pump as an adjuvant therapy in addition to compression and elevation to try to get her swelling under better control.  I discussed the pathophysiology and natural history of lymphedema with the patient today.  She voices her understanding.

## 2022-01-07 NOTE — Progress Notes (Signed)
MRN : [id number]  Kara [name] is a 88 y.o. (02-17-1923) female who presents with chief complaint of  Chief Complaint  Patient presents with   Follow-up    ultrasound  .  History of Present Illness: Patient returns today in follow up of her PAD.  She has been having more swelling in her right leg particularly.  This has been a progressive problem after she fell and broke her hip.  She was seen by her primary care provider who mentioned a pump that may be helpful for the leg swelling.  She has a little swelling in the left leg but nothing severe.  She denies any open wounds or infection.  No fever or chills.  She is not having any rest pain, lifestyle limiting claudication, or ulceration on the lower extremities.  Her ABIs today are 1.08 on the right and 1.19 on the left.  These may be falsely elevated from medial calcification, but her digit pressures are stable at 92 and 69 on the right and left respectively.  Current Outpatient Medications  Medication Sig Dispense Refill   acetaminophen (TYLENOL) 500 MG tablet Take 2 tablets (1,000 mg total) by mouth every 12 (twelve) hours. 60 tablet 11   Cholecalciferol (VITAMIN D) 50 MCG (2000 UT) CAPS Take 1 capsule by mouth daily at 2 am.     Cranberry 250 MG TABS Take 2 tablets by mouth daily at 2 am.     cyanocobalamin (,VITAMIN B-12,) 1000 MCG/ML injection INJECT 1ML INTO THE MUSCLE EVERY 14 DAYS 10 mL 0   diclofenac Sodium (VOLTAREN) 1 % GEL Apply 2 g topically 4 (four) times daily. To painful joints as needed 100 g 5   diphenoxylate-atropine (LOMOTIL) 2.5-0.025 MG tablet TAKE 1 TABLET BY MOUTH THREE TIMES DAILYAS NEEDED FOR DIARRHEA 90 tablet 2   estradiol (ESTRACE) 0.1 MG/GM vaginal cream Place a dab of cream around the urethra nightly for 2 weeks,  then twice weekly thereafter 42.5 g 1   Infant Care Products (DERMACLOUD) OINT Apply 1 application topically as needed. 430 g 1   Lidocaine (SALONPAS PAIN RELIEVING) 4 % PTCH Apply 1 patch daily to  your back and upper arm. 90 patch 1   metaxalone (SKELAXIN) 800 MG tablet Take 400 mg by mouth 3 (three) times daily as needed for muscle spasms.     metoprolol succinate (TOPROL-XL) 25 MG 24 hr tablet TAKE 1 TABLET BY MOUTH IN THE MORNING AND AT BEDTIME 180 tablet 0   ondansetron (ZOFRAN) 4 MG tablet Take 1 tablet (4 mg total) by mouth every 6 (six) hours as needed for nausea. 20 tablet 0   traMADol (ULTRAM) 50 MG tablet Take 1 tablet (50 mg total) by mouth every 12 (twelve) hours as needed for moderate pain. 60 tablet 2   triamcinolone (KENALOG) 0.1 % APPLY 1 APPLICATION TOPICALLY 2 TIMES DAILY TO VULVA UNTIL ITCHING RESOLVES THEN REDUCE USE TO TWICE WEEKLY. 15 g 1   No current facility-administered medications for this visit.    Past Medical History:  Diagnosis Date   Allergy    Atrial fibrillation (HCC)    CAD (coronary artery disease)    Cancer (HCC)    UTERINE   Chronic anticoagulation    Chronic diarrhea    Closed compression fracture of L2 lumbar vertebra, initial encounter ([place]) 11/08/2020   By CT scan done to evaluate severe persistent low back pain:  Impression below also notes spinal compression from bulging disk.  Superior endplate compression fracture of L2 with approximately 25% height loss and minimal, 2-3 mm retropulsion of the superior endplate.   Left-sided, nondisplaced zone 1 sacral insufficiency fracture.   Multilevel degenerative changes of the lumbar spine resulting    Closed intertrochanteric fracture of left femur ([place]) 08/22/2020   Decubitus ulcer    sacral region   Diabetes ([place])    diet controlled   Dysuria    E. coli UTI 11/21/2014   Edema of lower extremity    mainly right foot, slightly in left foot.   Facial fracture due to fall ([place]) 07/13/2020   Femur fracture, left ([place]) 06/09/2019   Fibrocystic breast disease    GERD (gastroesophageal reflux disease)    Glaucoma    Glaucoma    Hematuria    Hemorrhoids    History of colon polyps    History of  pancreatitis    Hospital discharge follow-up 07/22/2020   Hyperlipidemia    Hypertension    Hypokalemia    IBS (irritable bowel syndrome)    Microscopic hematuria    Mitral valve regurgitation    Orbital fracture ([place]) 07/13/2020   Osteoarthritis    fingers   Pernicious anemia    Plantar fasciitis    Recurrent UTI    Skin cancer    Sleep apnea, obstructive    Umbilical hernia without obstruction and without gangrene 06/26/2015   Vaginal atrophy    Vitamin D deficiency    Yeast vaginitis     Past Surgical History:  Procedure Laterality Date   ABDOMINAL HYSTERECTOMY  1980's   ABDOMINAL SURGERY     for villous polyp,,,many years ago   APPENDECTOMY  1940's   ASCAD, s/p PTCA  11/28/2005   MID LESION    BREAST BIOPSY Left 1970's   CARPAL TUNNEL RELEASE Right 12/13/2015   Procedure: CARPAL TUNNEL RELEASE;  Surgeon: Thornton Park, MD;  Location: [facility];  Service: Orthopedics;  Laterality: Right;   COLECTOMY  2015   INTRAMEDULLARY (IM) NAIL INTERTROCHANTERIC Left 06/11/2019   Procedure: INTRAMEDULLARY (IM) NAIL INTERTROCHANTRIC;  Surgeon: Earnestine Leys, MD;  Location: [facility];  Service: Orthopedics;  Laterality: Left;   KYPHOPLASTY N/A 11/15/2020   Procedure: KYPHOPLASTY, L2;  Surgeon: Hessie Knows, MD;  Location: [facility];  Service: Orthopedics;  Laterality: N/A;   PACEMAKER PLACEMENT     radation     for uterine cance   REFRACTIVE SURGERY     for bilateral glaumoma   SACROPLASTY N/A 11/15/2020   Procedure: SACROPLASTY;  Surgeon: Hessie Knows, MD;  Location: [facility];  Service: Orthopedics;  Laterality: N/A;   TONSILLECTOMY  1936     Social History   Tobacco Use   Smoking status: Former   Smokeless tobacco: Never   Tobacco comments:    quit 45 years ago  Vaping Use   Vaping Use: Never used  Substance Use Topics   Alcohol use: Yes    Alcohol/week: 0.0 standard drinks of alcohol    Comment: Rarely, 1-2 times a year   Drug use: No      Family History  Problem  Relation Age of Onset   Coronary artery disease Father    Kidney disease Father    Cancer Sister    Diabetes Other    Cancer Mother        COLON   Bladder Cancer Neg Hx    Breast cancer Neg Hx      Allergies  Allergen Reactions   Baycol [Cerivastatin Sodium]  Cardizem [Diltiazem Hcl]     LOWER EXTREMITY EDEMA   Crestor [Rosuvastatin Calcium] Other (See Comments)    INTOLERANT   Dronedarone Other (See Comments)    Fatigue   Metoprolol Tartrate Other (See Comments)   Omeprazole Other (See Comments)   Pravachol     INTOLERANT   Statins Other (See Comments)   Vioxx [Rofecoxib]    Zocor [Simvastatin]     INTOLERANT    REVIEW OF SYSTEMS (Negative unless checked)   Constitutional: [] Weight loss  [] Fever  [] Chills Cardiac: [] Chest pain   [] Chest pressure   [x] Palpitations   [] Shortness of breath when laying flat   [] Shortness of breath at rest   [x] Shortness of breath with exertion. Vascular:  [] Pain in legs with walking   [] Pain in legs at rest   [] Pain in legs when laying flat   [x] Claudication   [] Pain in feet when walking  [] Pain in feet at rest  [] Pain in feet when laying flat   [] History of DVT   [] Phlebitis   [x] Swelling in legs   [] Varicose veins   [] Non-healing ulcers Pulmonary:   [] Uses home oxygen   [] Productive cough   [] Hemoptysis   [] Wheeze  [] COPD   [] Asthma Neurologic:  [] Dizziness  [] Blackouts   [] Seizures   [] History of stroke   [] History of TIA  [] Aphasia   [] Temporary blindness   [] Dysphagia   [] Weakness or numbness in arms   [] Weakness or numbness in legs Musculoskeletal:  [x] Arthritis   [] Joint swelling   [x] Joint pain   [] Low back pain Hematologic:  [] Easy bruising  [] Easy bleeding   [] Hypercoagulable state   [] Anemic   Gastrointestinal:  [] Blood in stool   [] Vomiting blood  [] Gastroesophageal reflux/heartburn   [] Abdominal pain Genitourinary:  [] Chronic kidney disease   [] Difficult urination  [] Frequent urination  [] Burning with urination    [] Hematuria Skin:  [] Rashes   [] Ulcers   [] Wounds Psychological:  [] History of anxiety   []  History of major depression.  Physical Examination  BP (!) 178/73 (BP Location: Left Arm)   Pulse 90   Resp 18   Ht 5' 2"  (1.575 m)   Wt 126 lb (57.2 kg)   BMI 23.05 kg/m  Gen:  WD/WN, NAD. Appears younger than stated age. Head: [place]/AT, No temporalis wasting. Ear/Nose/Throat: Hearing grossly intact, nares w/o erythema or drainage Eyes: Conjunctiva clear. Sclera non-icteric Neck: Supple.  Trachea midline Pulmonary:  Good air movement, no use of accessory muscles.  Cardiac: irregular Vascular:  Vessel Right Left  Radial Palpable Palpable                          PT Not Palpable 1+ Palpable  DP 1+ Palpable 2+ Palpable   Gastrointestinal: soft, non-tender/non-distended. No guarding/reflex.  Musculoskeletal: M/S 5/5 throughout.  No deformity or atrophy. 2+ RLE edema, trace LLE edema. Neurologic: Sensation grossly intact in extremities.  Symmetrical.  Speech is fluent.  Psychiatric: Judgment intact, Mood & affect appropriate for pt's clinical situation. Dermatologic: No rashes or ulcers noted.  No cellulitis or open wounds.      Labs Recent Results (from the past 2160 hour(s))  Lipid Profile     Status: Abnormal   Collection Time: 11/18/21 12:20 PM  Result Value Ref Range   Cholesterol 160 0 - 200 mg/dL    Comment: ATP III Classification       Desirable:  < 200 mg/dL  Borderline High:  200 - 239 mg/dL          High:  > = 240 mg/dL   Triglycerides 264.0 (H) 0.0 - 149.0 mg/dL    Comment: Normal:  <150 mg/dLBorderline High:  150 - 199 mg/dL   HDL 48.70 >39.00 mg/dL   VLDL 52.8 (H) 0.0 - 40.0 mg/dL   Total CHOL/HDL Ratio 3     Comment:                Men          Women1/2 Average Risk     3.4          3.3Average Risk          5.0          4.42X Average Risk          9.6          7.13X Average Risk          15.0          11.0                       NonHDL 111.33      Comment: NOTE:  Non-HDL goal should be 30 mg/dL higher than patient's LDL goal (i.e. LDL goal of < 70 mg/dL, would have non-HDL goal of < 100 mg/dL)  HgB A1c     Status: Abnormal   Collection Time: 11/18/21 12:20 PM  Result Value Ref Range   Hgb A1c MFr Bld 6.8 (H) 4.6 - 6.5 %    Comment: Glycemic Control Guidelines for People with Diabetes:Non Diabetic:  <6%Goal of Therapy: <7%Additional Action Suggested:  >8%   Comp Met (CMET)     Status: Abnormal   Collection Time: 11/18/21 12:20 PM  Result Value Ref Range   Sodium 140 135 - 145 mEq/L   Potassium 3.9 3.5 - 5.1 mEq/L   Chloride 100 96 - 112 mEq/L   CO2 28 19 - 32 mEq/L   Glucose, Bld 176 (H) 70 - 99 mg/dL   BUN 24 (H) 6 - 23 mg/dL   Creatinine, Ser 0.90 0.40 - 1.20 mg/dL   Total Bilirubin 1.4 (H) 0.2 - 1.2 mg/dL   Alkaline Phosphatase 54 39 - 117 U/L   AST 20 0 - 37 U/L   ALT 13 0 - 35 U/L   Total Protein 6.4 6.0 - 8.3 g/dL   Albumin 4.0 3.5 - 5.2 g/dL   GFR 53.72 (L) >60.00 mL/min    Comment: Calculated using the CKD-EPI Creatinine Equation (2021)   Calcium 9.2 8.4 - 10.5 mg/dL  CBC w/Diff     Status: Abnormal   Collection Time: 11/18/21 12:20 PM  Result Value Ref Range   WBC 7.0 4.0 - 10.5 K/uL   RBC 4.34 3.87 - 5.11 Mil/uL   Hemoglobin 13.9 12.0 - 15.0 g/dL   HCT 41.7 36.0 - 46.0 %   MCV 96.1 78.0 - 100.0 fl   MCHC 33.3 30.0 - 36.0 g/dL   RDW 13.0 11.5 - 15.5 %   Platelets 136.0 (L) 150.0 - 400.0 K/uL   Neutrophils Relative % 50.9 43.0 - 77.0 %   Lymphocytes Relative 42.0 12.0 - 46.0 %   Monocytes Relative 5.7 3.0 - 12.0 %   Eosinophils Relative 0.9 0.0 - 5.0 %   Basophils Relative 0.5 0.0 - 3.0 %   Neutro Abs 3.5 1.4 - 7.7 K/uL   Lymphs Abs 2.9 0.7 -  4.0 K/uL   Monocytes Absolute 0.4 0.1 - 1.0 K/uL   Eosinophils Absolute 0.1 0.0 - 0.7 K/uL   Basophils Absolute 0.0 0.0 - 0.1 K/uL  LDL cholesterol, direct     Status: None   Collection Time: 11/18/21 12:20 PM  Result Value Ref Range   Direct LDL 74.0 mg/dL     Comment: Optimal:  <100 mg/dLNear or Above Optimal:  100-129 mg/dLBorderline High:  130-159 mg/dLHigh:  160-189 mg/dLVery High:  >190 mg/dL    Radiology No results found.  Assessment/Plan Essential hypertension blood pressure control important in reducing the progression of atherosclerotic disease. On appropriate oral medications.     Type 2 diabetes mellitus with diabetic neuropathy, unspecified (HCC) blood glucose control important in reducing the progression of atherosclerotic disease. Also, involved in wound healing. On appropriate medications.    Peripheral artery disease (HCC) Her ABIs today are 1.08 on the right and 1.19 on the left.  These may be falsely elevated from medial calcification, but her digit pressures are stable at 92 and 69 on the right and left respectively.  No limb threatening symptoms.  On appropriate medications.  Recheck in 1 year.  Leg swelling Patient has worsening swelling particularly on the right lower extremity.  This has been a progressive problem after her hip fracture and she is almost certainly developed lymphedema from chronic swelling, immobility, and chronic scarring and lymphatic channels.  This would be stage II lymphedema with swelling refractory to conservative measures.  I think it is reasonable to check a venous reflux study to ensure that venous insufficiency is not present.  I think she will benefit from a lymphedema pump as an adjuvant therapy in addition to compression and elevation to try to get her swelling under better control.  I discussed the pathophysiology and natural history of lymphedema with the patient today.  She voices her understanding.    Leotis Pain, MD  01/07/2022 3:51 PM    This note was created with Dragon medical transcription system.  Any errors from dictation are purely unintentional

## 2022-01-07 NOTE — Assessment & Plan Note (Signed)
Her ABIs today are 1.08 on the right and 1.19 on the left.  These may be falsely elevated from medial calcification, but her digit pressures are stable at 92 and 69 on the right and left respectively.  No limb threatening symptoms.  On appropriate medications.  Recheck in 1 year.

## 2022-01-10 ENCOUNTER — Ambulatory Visit: Payer: Self-pay | Admitting: *Deleted

## 2022-01-10 ENCOUNTER — Telehealth: Payer: Self-pay | Admitting: *Deleted

## 2022-01-10 NOTE — Patient Outreach (Signed)
  Care Coordination   01/10/2022 Name: Kara [name] MRN: [id number] DOB: 09-07-1922   Care Coordination Outreach Attempts:  An unsuccessful telephone outreach was attempted today to offer the patient information about available care coordination services as a benefit of their health plan.   Follow Up Plan:  Additional outreach attempts will be made to offer the patient care coordination information and services.   Encounter Outcome:  No Answer  Care Coordination Interventions Activated:  No   Care Coordination Interventions:  No, not indicated    Renna Kilmer, [place] Worker  [facility] Care Management [phone]

## 2022-01-10 NOTE — Patient Outreach (Addendum)
  Care Coordination   Initial Visit Note   01/10/2022 Name: Kara [name] MRN: [id number] DOB: 29-Jan-1923  Kara [name] is a 88 y.o. year old female who sees Derrel Nip, Aris Everts, MD for primary care. I spoke with  Kara [name] by phone today.  What matters to the patients health and wellness today? Lymphedema Manaagement    Goals Addressed             This Visit's Progress    I'd like to manage the swelling in my legs"       Care Coordination Interventions: Care Management program offered and discussed SDOH screening completed Patient has agreed to Care Management services-initial appointment set up with [facility] for 01/21/22  Patient resides in [place] at the [facility] Patient discussed having Lymphedema and would like to learn how to manage it better Patient's last appointment with her PCP ay 12/08/21 Patient is confirmed to be followed by Dr. Lucky Cowboy at [place] [facility] [place] scheduled appointment 01/16/22          SDOH assessments and interventions completed:  Yes  SDOH Interventions Today    Flowsheet Row Most Recent Value  SDOH Interventions   Financial Strain Interventions Intervention Not Indicated  Housing Interventions Intervention Not Indicated  Physical Activity Interventions Intervention Not Indicated  Social Connections Interventions Intervention Not Indicated  Transportation Interventions Intervention Not Indicated        Care Coordination Interventions Activated:  Yes  Care Coordination Interventions:  Yes, provided   Follow up plan: Follow up call scheduled for 01/21/22    Encounter Outcome:  Pt. Scheduled

## 2022-01-10 NOTE — Patient Instructions (Addendum)
Visit Information  Thank you for taking time to visit with me today. Please don't hesitate to contact me if I can be of assistance to you.   Following are the goals we discussed today:   Goals Addressed             This Visit's Progress    I'd like to manage the swelling in my legs"       Care Coordination Interventions: Care Management program offered and discussed SDOH screening completed Patient has agreed to Care Management services-initial appointment set up with [facility] for 01/21/22  Patient resides in [place] at the [facility] Patient discussed having Lymphedema and would like to learn how to manage it better Patient's last appointment with her PCP ay 12/08/21 Patient is confirmed to be followed by Dr. Lucky Cowboy at [place] scheduled appointment 01/16/22          Our next appointment is by telephone on 01/21/22 at 1pm  Please call the care guide team at [phone] if you need to cancel or reschedule your appointment.   If you are experiencing a Mental Health or [place] or need someone to talk to, please call the Suicide and Crisis Lifeline: 988   Patient verbalizes understanding of instructions and care plan provided today and agrees to view in [place]. Active MyChart status and patient understanding of how to access instructions and care plan via MyChart confirmed with patient.     Telephone follow up appointment with care management team member scheduled for: 01/21/22  Elliot Gurney, [place] Worker  [facility] Care Management [phone]

## 2022-01-15 ENCOUNTER — Other Ambulatory Visit: Payer: Self-pay | Admitting: Internal Medicine

## 2022-01-16 ENCOUNTER — Encounter (INDEPENDENT_AMBULATORY_CARE_PROVIDER_SITE_OTHER): Payer: Self-pay | Admitting: Nurse Practitioner

## 2022-01-16 ENCOUNTER — Ambulatory Visit (INDEPENDENT_AMBULATORY_CARE_PROVIDER_SITE_OTHER): Payer: Medicare Other | Admitting: Nurse Practitioner

## 2022-01-16 ENCOUNTER — Ambulatory Visit (INDEPENDENT_AMBULATORY_CARE_PROVIDER_SITE_OTHER): Payer: Medicare Other

## 2022-01-16 VITALS — BP 147/70 | HR 80 | Resp 17 | Ht 60.0 in | Wt 126.8 lb

## 2022-01-16 DIAGNOSIS — I890 Lymphedema, not elsewhere classified: Secondary | ICD-10-CM

## 2022-01-16 DIAGNOSIS — M7989 Other specified soft tissue disorders: Secondary | ICD-10-CM | POA: Diagnosis not present

## 2022-01-16 DIAGNOSIS — E1140 Type 2 diabetes mellitus with diabetic neuropathy, unspecified: Secondary | ICD-10-CM | POA: Diagnosis not present

## 2022-01-16 DIAGNOSIS — I10 Essential (primary) hypertension: Secondary | ICD-10-CM | POA: Diagnosis not present

## 2022-01-21 ENCOUNTER — Ambulatory Visit: Payer: Self-pay

## 2022-01-21 NOTE — Patient Outreach (Signed)
  Care Coordination   01/21/2022 Name: Kara [name] MRN: [id number] DOB: 09-25-1922   Care Coordination Outreach Attempts:  An unsuccessful telephone outreach was attempted today to offer the patient information about available care coordination services as a benefit of their health plan.  Attempted to reach patient on both home and mobile number.  Unable to reach. HIPAA compliant voice messages left at both phone numbers.   Follow Up Plan:  Additional outreach attempts will be made to offer the patient care coordination information and services.   Encounter Outcome:  No Answer  Care Coordination Interventions Activated:  No   Care Coordination Interventions:  No, not indicated    Quinn Plowman RN,BSN,CCM RN Care Manager Coordinator [phone]

## 2022-01-24 ENCOUNTER — Telehealth: Payer: Self-pay | Admitting: *Deleted

## 2022-01-24 ENCOUNTER — Ambulatory Visit: Payer: Self-pay | Admitting: *Deleted

## 2022-01-24 NOTE — Chronic Care Management (AMB) (Signed)
  Care Coordination  Outreach Note  01/24/2022 Name: KATIANNA MCCLENNEY MRN: [id number] DOB: 12/07/1922   Care Coordination Outreach Attempts: An unsuccessful telephone outreach was attempted today to offer the patient information about available care coordination services as a benefit of their health plan.   Follow Up Plan:  Additional outreach attempts will be made to offer the patient care coordination information and services.   Encounter Outcome:  No Answer  Julian Hy, [place] Direct Dial: [phone]

## 2022-01-24 NOTE — Patient Instructions (Signed)
Visit Information  Thank you for taking time to visit with me today. Please don't hesitate to contact me if I can be of assistance to you.   Following are the goals we discussed today:   Goals Addressed             This Visit's Progress    I'd like to manage the swelling in my legs"       Care Coordination Interventions: Phone call from patient stating that she missed the call from [facility] and wanted to re-schedule Care Management program re-introduced with regard to the reason for RNCM's call discussed Appointment re-scheduled for 01/27/22 at 10:30am          Our next appointment is by telephone on 01/27/22 at 10:30am  Please call the care guide team at [phone] if you need to cancel or reschedule your appointment.   If you are experiencing a Mental Health or [place] or need someone to talk to, please call [phone]   Patient verbalizes understanding of instructions and care plan provided today and agrees to view in [place]. Active MyChart status and patient understanding of how to access instructions and care plan via MyChart confirmed with patient.     Telephone follow up appointment with care management team member scheduled for: 01/27/22   Elliot Gurney, [place] Worker  [facility] Care Management [phone]

## 2022-01-24 NOTE — Patient Outreach (Signed)
  Care Coordination   Follow Up Visit Note   01/24/2022 Name: KIMBERLE STANFILL MRN: [id number] DOB: 11/07/1922  JOURNII NIERMAN is a 88 y.o. year old female who sees Derrel Nip, Aris Everts, MD for primary care. I spoke with  [name] by phone today.  What matters to the patients health and wellness today?  Per patient, she would like to manage the swelling in her legs    Goals Addressed             This Visit's Progress    I'd like to manage the swelling in my legs"       Care Coordination Interventions: Phone call from patient stating that she missed the call from [facility] and wanted to re-schedule Care Management program re-introduced with regard to the reason for RNCM's call discussed Appointment re-scheduled for 01/27/22 at 10:30am          SDOH assessments and interventions completed:  No     Care Coordination Interventions Activated:  Yes  Care Coordination Interventions:  Yes, provided   Follow up plan: Follow up call scheduled for 01/27/22 with RNCM    Encounter Outcome:  Pt. Scheduled

## 2022-01-27 ENCOUNTER — Ambulatory Visit: Payer: Self-pay

## 2022-01-27 NOTE — Patient Outreach (Signed)
  Care Coordination   Initial Visit Note   01/27/2022 Name: Kara [name] MRN: [id number] DOB: 02/03/1923  Kara [name] is a 88 y.o. year old female who sees Derrel Nip, Aris Everts, MD for primary care. I spoke with  Kara [name] by phone today.  What matters to the patients health and wellness today?  Patient states she uses a rollator for ambulation and is able to live by herself.  She states she's had right leg swelling for years but it seems to have gotten worse and she also has swelling her in left leg now. She reports having an ultrasound of her leg approximately 1-2 weeks ago. She states she has not heard anything regarding the results. Patient reports she wears compression hose only now and then because they are to hard to get on and off.  Patient reports falling approximately 2-3 years ago and breaking her hip.     Goals Addressed             This Visit's Progress    Patient Stated: Manage my leg pain/ swelling and back pain       Care Coordination Interventions: Evaluation of current treatment plan related to leg / back pain and patient's adherence to plan as established by provider Reviewed medications with patient and discussed Importance of compliance. Advised to take pain medications as prescribed by provider.  Reviewed scheduled/upcoming provider appointments Discussed plans with patient for ongoing care management follow up and provided patient with direct contact information for care management team Patient advised to call vascular providers office for ultrasound results Advised to elevate legs when sitting and continue to follow a low salt diet.           SDOH assessments and interventions completed:  No     Care Coordination Interventions Activated:  Yes  Care Coordination Interventions:  Yes, provided   Follow up plan: Follow up call scheduled for October 23, 23 at 11:00 am    Encounter Outcome:  Pt. Visit Completed   Quinn Plowman RN,BSN,CCM [place] [phone] direct line

## 2022-01-27 NOTE — Patient Instructions (Signed)
Visit Information  Thank you for taking time to visit with me today. Please don't hesitate to contact me if I can be of assistance to you.   Following are the goals we discussed today:   Goals Addressed             This Visit's Progress    Patient Stated: Manage my leg pain/ swelling and back pain       Care Coordination Interventions: Evaluation of current treatment plan related to leg / back pain and patient's adherence to plan as established by provider Reviewed medications with patient and discussed Importance of compliance. Advised to take pain medications as prescribed by provider.  Reviewed scheduled/upcoming provider appointments Discussed plans with patient for ongoing care management follow up and provided patient with direct contact information for care management team Patient advised to call vascular providers office for leg ultrasound results Advised to elevate legs when sitting and continue to follow a low salt diet.           Our next appointment is by telephone on 03/10/22 at 11:00 am  Please call the care guide team at [phone] if you need to cancel or reschedule your appointment.   If you are experiencing a Mental Health or [place] or need someone to talk to, please call 1-800-273-TALK (toll free, 24 hour hotline)  Patient verbalizes understanding of instructions and care plan provided today and agrees to view in [place]. Active MyChart status and patient understanding of how to access instructions and care plan via MyChart confirmed with patient.     Quinn Plowman RN,BSN,CCM [place] Coordination [phone] direct line

## 2022-01-28 DIAGNOSIS — I495 Sick sinus syndrome: Secondary | ICD-10-CM | POA: Diagnosis not present

## 2022-02-05 ENCOUNTER — Encounter (INDEPENDENT_AMBULATORY_CARE_PROVIDER_SITE_OTHER): Payer: Self-pay | Admitting: Nurse Practitioner

## 2022-02-05 NOTE — Chronic Care Management (AMB) (Signed)
Per recent RN notes, pt has completed f/u appt with RN and follow up already scheduled

## 2022-02-05 NOTE — Progress Notes (Signed)
Subjective:    Patient ID: Kara [name], female    DOB: 1922-08-22, 88 y.o.   MRN: [id number] No chief complaint on file.   Patient returns today in follow up of her lower extremity leg swelling she has been having more swelling in her right leg particularly.  This has been a progressive problem after she fell and broke her hip.  She was seen by her primary care provider who mentioned a pump that may be helpful for the leg swelling.  She has a little swelling in the left leg but nothing severe.  She denies any open wounds or infection.  No fever or chills.  She is not having any rest pain, lifestyle limiting claudication, or ulceration on the lower extremities.  Previously, her ABIs today are 1.08 on the right and 1.19 on the left.  These may be falsely elevated from medial calcification, but her digit pressures are stable at 92 and 69 on the right and left respectively.  Today noninvasive studies show no evidence of DVT or superficial thrombophlebitis bilaterally no evidence of deep venous insufficiency or superficial venous reflux bilaterally    Review of Systems  Cardiovascular:  Positive for leg swelling.  All other systems reviewed and are negative.      Objective:   Physical Exam Vitals reviewed.  HENT:     Head: Normocephalic.  Cardiovascular:     Rate and Rhythm: Normal rate.     Pulses: Normal pulses.  Pulmonary:     Effort: Pulmonary effort is normal.  Musculoskeletal:     Right lower leg: Edema present.     Left lower leg: Edema present.  Skin:    General: Skin is warm and dry.     Comments: Bilateral stasis dermatitis  Neurological:     Mental Status: She is alert and oriented to person, place, and time.  Psychiatric:        Mood and Affect: Mood normal.        Behavior: Behavior normal.        Thought Content: Thought content normal.        Judgment: Judgment normal.     BP (!) 147/70 (BP Location: Left Arm)   Pulse 80   Resp 17   Ht 5' (1.524 m)   Wt  126 lb 12.8 oz (57.5 kg)   BMI 24.76 kg/m   Past Medical History:  Diagnosis Date   Allergy    Atrial fibrillation (HCC)    CAD (coronary artery disease)    Cancer (HCC)    UTERINE   Chronic anticoagulation    Chronic diarrhea    Closed compression fracture of L2 lumbar vertebra, initial encounter ([place]) 11/08/2020   By CT scan done to evaluate severe persistent low back pain:  Impression below also notes spinal compression from bulging disk.     Superior endplate compression fracture of L2 with approximately 25% height loss and minimal, 2-3 mm retropulsion of the superior endplate.   Left-sided, nondisplaced zone 1 sacral insufficiency fracture.   Multilevel degenerative changes of the lumbar spine resulting    Closed intertrochanteric fracture of left femur ([place]) 08/22/2020   Decubitus ulcer    sacral region   Diabetes ([place])    diet controlled   Dysuria    E. coli UTI 11/21/2014   Edema of lower extremity    mainly right foot, slightly in left foot.   Facial fracture due to fall ([place]) 07/13/2020   Femur fracture, left ([place]) 06/09/2019  Fibrocystic breast disease    GERD (gastroesophageal reflux disease)    Glaucoma    Glaucoma    Hematuria    Hemorrhoids    History of colon polyps    History of pancreatitis    Hospital discharge follow-up 07/22/2020   Hyperlipidemia    Hypertension    Hypokalemia    IBS (irritable bowel syndrome)    Microscopic hematuria    Mitral valve regurgitation    Orbital fracture ([place]) 07/13/2020   Osteoarthritis    fingers   Pernicious anemia    Plantar fasciitis    Recurrent UTI    Skin cancer    Sleep apnea, obstructive    Umbilical hernia without obstruction and without gangrene 06/26/2015   Vaginal atrophy    Vitamin D deficiency    Yeast vaginitis     Social History   Socioeconomic History   Marital status: Widowed    Spouse name: Not on file   Number of children: 3   Years of education: Not on file   Highest education level: Not on  file  Occupational History   Occupation: Retired Restaurant manager, fast food - primary  Tobacco Use   Smoking status: Former   Smokeless tobacco: Never   Tobacco comments:    quit 45 years ago  Vaping Use   Vaping Use: Never used  Substance and Sexual Activity   Alcohol use: Yes    Alcohol/week: 0.0 standard drinks of alcohol    Comment: Rarely, 1-2 times a year   Drug use: No   Sexual activity: Not Currently    Birth control/protection: Surgical  Other Topics Concern   Not on file  Social History Narrative   ** Merged History Encounter **       Lives at [place]. Born in [place]. Travels frequently.      1 daughter, 2 step children      Regular Exercise -  NO   Daily Caffeine Use:  1 coffee            Social Determinants of Health   Financial Resource Strain: Low Risk  (01/10/2022)   Overall Financial Resource Strain (CARDIA)    Difficulty of Paying Living Expenses: Not hard at all  Food Insecurity: No Food Insecurity (07/23/2020)   Hunger Vital Sign    Worried About Running Out of Food in the Last Year: Never true    Ran Out of Food in the Last Year: Never true  Transportation Needs: No Transportation Needs (01/10/2022)   PRAPARE - Hydrologist (Medical): No    Lack of Transportation (Non-Medical): No  Physical Activity: Insufficiently Active (01/10/2022)   Exercise Vital Sign    Days of Exercise per Week: 2 days    Minutes of Exercise per Session: 10 min  Stress: No Stress Concern Present (07/23/2020)   [place]    Feeling of Stress : Not at all  Social Connections: Moderately Isolated (01/10/2022)   Social Connection and Isolation Panel [NHANES]    Frequency of Communication with Friends and Family: More than three times a week    Frequency of Social Gatherings with Friends and Family: More than three times a week    Attends Religious Services: Never    Building surveyor or Organizations: Yes    Attends Music therapist: More than 4 times per year    Marital Status: Widowed  Intimate Partner Violence:  Not At Risk (07/23/2020)   Humiliation, Afraid, Rape, and Kick questionnaire    Fear of Current or Ex-Partner: No    Emotionally Abused: No    Physically Abused: No    Sexually Abused: No    Past Surgical History:  Procedure Laterality Date   ABDOMINAL HYSTERECTOMY  1980's   ABDOMINAL SURGERY     for villous polyp,,,many years ago   APPENDECTOMY  1940's   ASCAD, s/p PTCA  11/28/2005   MID LESION    BREAST BIOPSY Left 1970's   CARPAL TUNNEL RELEASE Right 12/13/2015   Procedure: CARPAL TUNNEL RELEASE;  Surgeon: Thornton Park, MD;  Location: [facility] ORS;  Service: Orthopedics;  Laterality: Right;   COLECTOMY  2015   INTRAMEDULLARY (IM) NAIL INTERTROCHANTERIC Left 06/11/2019   Procedure: INTRAMEDULLARY (IM) NAIL INTERTROCHANTRIC;  Surgeon: Earnestine Leys, MD;  Location: [facility];  Service: Orthopedics;  Laterality: Left;   KYPHOPLASTY N/A 11/15/2020   Procedure: KYPHOPLASTY, L2;  Surgeon: Hessie Knows, MD;  Location: [facility] OR[facility];  Service: Orthopedics;  Laterality: N/A;   PACEMAKER PLACEMENT     radation     for uterine cance   REFRACTIVE SURGERY     for bilateral glaumoma   SACROPLASTY N/A 11/15/2020   Procedure: SACROPLASTY;  Surgeon: Hessie Knows, MD;  Location: [facility];  Service: Orthopedics;  Laterality: N/A;   TONSILLECTOMY  1936    Family History  Problem Relation Age of Onset   Coronary artery disease Father    Kidney disease Father    Cancer Sister    Diabetes Other    Cancer Mother        COLON   Bladder Cancer Neg Hx    Breast cancer Neg Hx     Allergies  Allergen Reactions   Baycol [Cerivastatin Sodium]    Cardizem [Diltiazem Hcl]     LOWER EXTREMITY EDEMA   Crestor [Rosuvastatin Calcium] Other (See Comments)    INTOLERANT   Dronedarone Other (See Comments)    Fatigue   Metoprolol Tartrate Other (See  Comments)   Omeprazole Other (See Comments)   Pravachol     INTOLERANT   Statins Other (See Comments)   Vioxx [Rofecoxib]    Zocor [Simvastatin]     INTOLERANT       Latest Ref Rng & Units 11/18/2021   12:20 PM 11/14/2020    5:38 AM 11/13/2020    1:58 PM  CBC  WBC 4.0 - 10.5 K/uL 7.0  8.6  9.6   Hemoglobin 12.0 - 15.0 g/dL 13.9  13.7  14.9   Hematocrit 36.0 - 46.0 % 41.7  41.4  44.1   Platelets 150.0 - 400.0 K/uL 136.0  176  177       CMP     Component Value Date/Time   NA 140 11/18/2021 1220   NA 142 05/22/2014 1404   K 3.9 11/18/2021 1220   K 3.2 (L) 05/22/2014 1404   CL 100 11/18/2021 1220   CL 105 05/22/2014 1404   CO2 28 11/18/2021 1220   CO2 28 05/22/2014 1404   GLUCOSE 176 (H) 11/18/2021 1220   GLUCOSE 135 (H) 05/22/2014 1404   BUN 24 (H) 11/18/2021 1220   BUN 18 05/22/2014 1404   CREATININE 0.90 11/18/2021 1220   CREATININE 0.82 09/15/2014 1536   CALCIUM 9.2 11/18/2021 1220   CALCIUM 8.7 05/22/2014 1404   PROT 6.4 11/18/2021 1220   PROT 5.7 (L) 02/05/2012 0700   ALBUMIN 4.0 11/18/2021 1220   ALBUMIN 2.5 (L) 02/05/2012  0700   AST 20 11/18/2021 1220   AST 17 02/05/2012 0700   ALT 13 11/18/2021 1220   ALT 19 02/05/2012 0700   ALKPHOS 54 11/18/2021 1220   ALKPHOS 76 02/05/2012 0700   BILITOT 1.4 (H) 11/18/2021 1220   BILITOT 0.7 02/05/2012 0700   GFRNONAA >60 11/15/2020 0539   GFRNONAA 56 (L) 05/22/2014 1404   GFRNONAA >60 02/12/2012 0735   GFRAA >60 06/13/2019 0525   GFRAA >60 05/22/2014 1404   GFRAA >60 02/12/2012 0735     VAS [place] ABI WITH/WO TBI  Result Date: 01/08/2022  LOWER EXTREMITY DOPPLER STUDY Patient Name:  Kara [name]  Date of Exam:   01/07/2022 Medical Rec #: [id number]      Accession #:    [id number] Date of Birth: 06/12/1922       Patient Gender: F Patient Age:   49 years Exam Location:  [facility] Procedure:      VAS [place] ABI WITH/WO TBI Referring Phys:  --------------------------------------------------------------------------------  Indications: Peripheral artery disease.  Performing Technologist: Concha Norway RVT  Examination Guidelines: A complete evaluation includes at minimum, Doppler waveform signals and systolic blood pressure reading at the level of bilateral brachial, anterior tibial, and posterior tibial arteries, when vessel segments are accessible. Bilateral testing is considered an integral part of a complete examination. Photoelectric Plethysmograph (PPG) waveforms and toe systolic pressure readings are included as required and additional duplex testing as needed. Limited examinations for reoccurring indications may be performed as noted.  ABI Findings: +---------+------------------+-----+--------+--------+ Right    Rt Pressure (mmHg)IndexWaveformComment  +---------+------------------+-----+--------+--------+ Brachial 143                                     +---------+------------------+-----+--------+--------+ ATA      157               1.08 biphasic         +---------+------------------+-----+--------+--------+ PTA      146               1.01 biphasic         +---------+------------------+-----+--------+--------+ Great Toe92                0.63 Normal           +---------+------------------+-----+--------+--------+ +---------+------------------+-----+----------+-------+ Left     Lt Pressure (mmHg)IndexWaveform  Comment +---------+------------------+-----+----------+-------+ Brachial 145                                      +---------+------------------+-----+----------+-------+ ATA      173               1.19 monophasic        +---------+------------------+-----+----------+-------+ PTA      162               1.12 monophasic        +---------+------------------+-----+----------+-------+ Great Toe69                0.48 Abnormal          +---------+------------------+-----+----------+-------+  Left ABIs appear increased compared to prior study on 11/2019.  Summary: Right: Resting right ankle-brachial index is within normal range. The right toe-brachial index is abnormal. Left: Resting left ankle-brachial index is within normal range. The left toe-brachial index is abnormal. *See table(s) above for measurements and observations.  Electronically  signed by Leotis Pain MD on 01/08/2022 at 8:33:44 AM.    Final        Assessment & Plan:   1. Lymphedema Recommend:  No surgery or intervention at this point in time.    I have reviewed my discussion with the patient regarding lymphedema and why it  causes symptoms.  Patient will continue wearing graduated compression on a daily basis. The patient should put the compression on first thing in the morning and removing them in the evening. The patient should not sleep in the compression.   In addition, behavioral modification throughout the day will be continued.  This will include frequent elevation (such as in a recliner), use of over the counter pain medications as needed and exercise such as walking.  The systemic causes for chronic edema such as liver, kidney and cardiac etiologies do not appear to have significant changed over the past year.    Despite conservative treatments of at least 4 weeks including: Including graduated compression therapy class 1 and behavioral modification including exercise and elevation the patient  has not obtained adequate control of the lymphedema.  The patient still has stage 3 lymphedema and therefore, I believe that a lymph pump should be added to improve the control of the patient's lymphedema.  Additionally, a lymph pump is warranted because it will reduce the risk of cellulitis and ulceration in the future.  Patient should follow-up in six months   - VAS [place] LOWER EXTREMITY VENOUS REFLUX  2. Essential hypertension Continue antihypertensive medications as already ordered, these medications have been reviewed  and there are no changes at this time.   3. Type 2 diabetes mellitus with diabetic neuropathy, without long-term current use of insulin (HCC) Continue hypoglycemic medications as already ordered, these medications have been reviewed and there are no changes at this time.  Hgb A1C to be monitored as already arranged by primary service    Current Outpatient Medications on File Prior to Visit  Medication Sig Dispense Refill   acetaminophen (TYLENOL) 500 MG tablet Take 2 tablets (1,000 mg total) by mouth every 12 (twelve) hours. 60 tablet 11   Cholecalciferol (VITAMIN D) 50 MCG (2000 UT) CAPS Take 1 capsule by mouth daily at 2 am.     Cranberry 250 MG TABS Take 2 tablets by mouth daily at 2 am.     cyanocobalamin (,VITAMIN B-12,) 1000 MCG/ML injection INJECT 1ML INTO THE MUSCLE EVERY 14 DAYS 10 mL 0   diclofenac Sodium (VOLTAREN) 1 % GEL Apply 2 g topically 4 (four) times daily. To painful joints as needed 100 g 5   diphenoxylate-atropine (LOMOTIL) 2.5-0.025 MG tablet TAKE 1 TABLET BY MOUTH THREE TIMES DAILYAS NEEDED FOR DIARRHEA 90 tablet 2   estradiol (ESTRACE) 0.1 MG/GM vaginal cream Place a dab of cream around the urethra nightly for 2 weeks,  then twice weekly thereafter 42.5 g 1   Infant Care Products (DERMACLOUD) OINT Apply 1 application topically as needed. 430 g 1   Lidocaine (SALONPAS PAIN RELIEVING) 4 % PTCH Apply 1 patch daily to your back and upper arm. 90 patch 1   metaxalone (SKELAXIN) 800 MG tablet Take 400 mg by mouth 3 (three) times daily as needed for muscle spasms.     metoprolol succinate (TOPROL-XL) 25 MG 24 hr tablet TAKE 1 TABLET BY MOUTH IN THE MORNING AND AT BEDTIME 180 tablet 0   ondansetron (ZOFRAN) 4 MG tablet Take 1 tablet (4 mg total) by mouth every 6 (six) hours  as needed for nausea. (Patient not taking: Reported on 01/27/2022) 20 tablet 0   traMADol (ULTRAM) 50 MG tablet Take 1 tablet (50 mg total) by mouth every 12 (twelve) hours as needed for moderate pain. 60  tablet 2   triamcinolone (KENALOG) 0.1 % APPLY 1 APPLICATION TOPICALLY 2 TIMES DAILY TO VULVA UNTIL ITCHING RESOLVES THEN REDUCE USE TO TWICE WEEKLY. 15 g 1   No current facility-administered medications on file prior to visit.    There are no Patient Instructions on file for this visit. No follow-ups on file.   Kris Hartmann, NP

## 2022-02-07 ENCOUNTER — Telehealth (INDEPENDENT_AMBULATORY_CARE_PROVIDER_SITE_OTHER): Payer: Self-pay | Admitting: Vascular Surgery

## 2022-02-07 NOTE — Telephone Encounter (Signed)
Patient was last seen 01/16/22 for right le reflux and follow up with Eulogio Ditch NP. I did leave a message for patient  to return call back to the office

## 2022-02-07 NOTE — Telephone Encounter (Signed)
Patient called and LVM stating she wanted some results from her test.  Please advise

## 2022-02-07 NOTE — Telephone Encounter (Signed)
Patient informed that lymph pump will be submitted

## 2022-02-11 ENCOUNTER — Telehealth (INDEPENDENT_AMBULATORY_CARE_PROVIDER_SITE_OTHER): Payer: Self-pay

## 2022-02-11 NOTE — Telephone Encounter (Signed)
Pt calls stating she has not heard anything from US done on 08/31.  I called pt and spoke to her to remind her of the conversation her and fallon had here in the office on 08/31.  She remembered the converation bue was still confused about getting a lymph pump.  I spoke with Mickel Baas and she is sending over the orders today for the lymph pump.  I let [name] know that if [facility] does not call her in 7-10days please call me back.  She stated understanding.

## 2022-02-26 DIAGNOSIS — Z23 Encounter for immunization: Secondary | ICD-10-CM | POA: Diagnosis not present

## 2022-03-05 ENCOUNTER — Other Ambulatory Visit: Payer: Self-pay | Admitting: Internal Medicine

## 2022-03-10 ENCOUNTER — Ambulatory Visit: Payer: Self-pay

## 2022-03-10 NOTE — Patient Outreach (Signed)
  Care Coordination   03/10/2022 Name: TITA TERHAAR MRN: [id number] DOB: 1922/08/03   Care Coordination Outreach Attempts:  An unsuccessful telephone outreach was attempted for a scheduled appointment today.  Follow Up Plan:  Additional outreach attempts will be made to offer the patient care coordination information and services.   Encounter Outcome:  No Answer  Care Coordination Interventions Activated:  No   Care Coordination Interventions:  No, not indicated    Quinn Plowman RN,BSN,CCM [place] [phone] direct line

## 2022-03-12 ENCOUNTER — Ambulatory Visit (INDEPENDENT_AMBULATORY_CARE_PROVIDER_SITE_OTHER): Payer: Medicare Other

## 2022-03-12 VITALS — Ht 61.0 in | Wt 126.0 lb

## 2022-03-12 DIAGNOSIS — Z0000 Encounter for general adult medical examination without abnormal findings: Secondary | ICD-10-CM

## 2022-03-12 NOTE — Patient Instructions (Addendum)
Ms. Kunde , Thank you for taking time to come for your Medicare Wellness Visit. I appreciate your ongoing commitment to your health goals. Please review the following plan we discussed and let me know if I can assist you in the future.   These are the goals we discussed:   Goals Addressed               This Visit's Progress     Patient Stated     DIET - REDUCE SUGAR INTAKE (pt-stated)        Monitor diet      This is a list of the screening recommended for you and due dates:  Health Maintenance  Topic Date Due   COVID-19 Vaccine (3 - Pfizer risk series) 03/28/2022*   Zoster (Shingles) Vaccine (1 of 2) 06/12/2022*   Flu Shot  08/17/2022*   Hemoglobin A1C  05/21/2022   Eye exam for diabetics  10/03/2022   Complete foot exam   11/19/2022   Medicare Annual Wellness Visit  04/12/2023   Tetanus Vaccine  07/13/2030   Pneumonia Vaccine  Completed   DEXA scan (bone density measurement)  Completed   HPV Vaccine  Aged Out  *Topic was postponed. The date shown is not the original due date.    Advanced directives: End of life planning; Advance aging; Advanced directives discussed.  Copy of current HCPOA/Living Will requested.    Conditions/risks identified: none new  Next appointment: Follow up in one year for your annual wellness visit    Preventive Care 65 Years and Older, Female Preventive care refers to lifestyle choices and visits with your health care provider that can promote health and wellness. What does preventive care include? A yearly physical exam. This is also called an annual well check. Dental exams once or twice a year. Routine eye exams. Ask your health care provider how often you should have your eyes checked. Personal lifestyle choices, including: Daily care of your teeth and gums. Regular physical activity. Eating a healthy diet. Avoiding tobacco and drug use. Limiting alcohol use. Practicing safe sex. Taking low-dose aspirin every day. Taking  vitamin and mineral supplements as recommended by your health care provider. What happens during an annual well check? The services and screenings done by your health care provider during your annual well check will depend on your age, overall health, lifestyle risk factors, and family history of disease. Counseling  Your health care provider may ask you questions about your: Alcohol use. Tobacco use. Drug use. Emotional well-being. Home and relationship well-being. Sexual activity. Eating habits. History of falls. Memory and ability to understand (cognition). Work and work Statistician. Reproductive health. Screening  You may have the following tests or measurements: Height, weight, and BMI. Blood pressure. Lipid and cholesterol levels. These may be checked every 5 years, or more frequently if you are over 16 years old. Skin check. Lung cancer screening. You may have this screening every year starting at age 11 if you have a 30-pack-year history of smoking and currently smoke or have quit within the past 15 years. Fecal occult blood test (FOBT) of the stool. You may have this test every year starting at age 81. Flexible sigmoidoscopy or colonoscopy. You may have a sigmoidoscopy every 5 years or a colonoscopy every 10 years starting at age 39. Hepatitis C blood test. Hepatitis B blood test. Sexually transmitted disease (STD) testing. Diabetes screening. This is done by checking your blood sugar (glucose) after you have not eaten for a while (  fasting). You may have this done every 1-3 years. Bone density scan. This is done to screen for osteoporosis. You may have this done starting at age 45. Mammogram. This may be done every 1-2 years. Talk to your health care provider about how often you should have regular mammograms. Talk with your health care provider about your test results, treatment options, and if necessary, the need for more tests. Vaccines  Your health care provider may  recommend certain vaccines, such as: Influenza vaccine. This is recommended every year. Tetanus, diphtheria, and acellular pertussis (Tdap, Td) vaccine. You may need a Td booster every 10 years. Zoster vaccine. You may need this after age 51. Pneumococcal 13-valent conjugate (PCV13) vaccine. One dose is recommended after age 64. Pneumococcal polysaccharide (PPSV23) vaccine. One dose is recommended after age 53. Talk to your health care provider about which screenings and vaccines you need and how often you need them. This information is not intended to replace advice given to you by your health care provider. Make sure you discuss any questions you have with your health care provider. Document Released: 06/01/2015 Document Revised: 01/23/2016 Document Reviewed: 03/06/2015 Elsevier Interactive Patient Education  2017 [place] Prevention in the Home Falls can cause injuries. They can happen to people of all ages. There are many things you can do to make your home safe and to help prevent falls. What can I do on the outside of my home? Regularly fix the edges of walkways and driveways and fix any cracks. Remove anything that might make you trip as you walk through a door, such as a raised step or threshold. Trim any bushes or trees on the path to your home. Use bright outdoor lighting. Clear any walking paths of anything that might make someone trip, such as rocks or tools. Regularly check to see if handrails are loose or broken. Make sure that both sides of any steps have handrails. Any raised decks and porches should have guardrails on the edges. Have any leaves, snow, or ice cleared regularly. Use sand or salt on walking paths during winter. Clean up any spills in your garage right away. This includes oil or grease spills. What can I do in the bathroom? Use night lights. Install grab bars by the toilet and in the tub and shower. Do not use towel bars as grab bars. Use non-skid  mats or decals in the tub or shower. If you need to sit down in the shower, use a plastic, non-slip stool. Keep the floor dry. Clean up any water that spills on the floor as soon as it happens. Remove soap buildup in the tub or shower regularly. Attach bath mats securely with double-sided non-slip rug tape. Do not have throw rugs and other things on the floor that can make you trip. What can I do in the bedroom? Use night lights. Make sure that you have a light by your bed that is easy to reach. Do not use any sheets or blankets that are too big for your bed. They should not hang down onto the floor. Have a firm chair that has side arms. You can use this for support while you get dressed. Do not have throw rugs and other things on the floor that can make you trip. What can I do in the kitchen? Clean up any spills right away. Avoid walking on wet floors. Keep items that you use a lot in easy-to-reach places. If you need to reach something above you, use  a strong step stool that has a grab bar. Keep electrical cords out of the way. Do not use floor polish or wax that makes floors slippery. If you must use wax, use non-skid floor wax. Do not have throw rugs and other things on the floor that can make you trip. What can I do with my stairs? Do not leave any items on the stairs. Make sure that there are handrails on both sides of the stairs and use them. Fix handrails that are broken or loose. Make sure that handrails are as long as the stairways. Check any carpeting to make sure that it is firmly attached to the stairs. Fix any carpet that is loose or worn. Avoid having throw rugs at the top or bottom of the stairs. If you do have throw rugs, attach them to the floor with carpet tape. Make sure that you have a light switch at the top of the stairs and the bottom of the stairs. If you do not have them, ask someone to add them for you. What else can I do to help prevent falls? Wear shoes  that: Do not have high heels. Have rubber bottoms. Are comfortable and fit you well. Are closed at the toe. Do not wear sandals. If you use a stepladder: Make sure that it is fully opened. Do not climb a closed stepladder. Make sure that both sides of the stepladder are locked into place. Ask someone to hold it for you, if possible. Clearly mark and make sure that you can see: Any grab bars or handrails. First and last steps. Where the edge of each step is. Use tools that help you move around (mobility aids) if they are needed. These include: Canes. Walkers. Scooters. Crutches. Turn on the lights when you go into a dark area. Replace any light bulbs as soon as they burn out. Set up your furniture so you have a clear path. Avoid moving your furniture around. If any of your floors are uneven, fix them. If there are any pets around you, be aware of where they are. Review your medicines with your doctor. Some medicines can make you feel dizzy. This can increase your chance of falling. Ask your doctor what other things that you can do to help prevent falls. This information is not intended to replace advice given to you by your health care provider. Make sure you discuss any questions you have with your health care provider. Document Released: 03/01/2009 Document Revised: 10/11/2015 Document Reviewed: 06/09/2014 Elsevier Interactive Patient Education  2017 [place]. Opioid Pain Medicine Management Opioids are powerful medicines that are used to treat moderate to severe pain. When used for short periods of time, they can help you to: Sleep better. Do better in physical or occupational therapy. Feel better in the first few days after an injury. Recover from surgery. Opioids should be taken with the supervision of a trained health care provider. They should be taken for the shortest period of time possible. This is because opioids can be addictive, and the longer you take opioids, the  greater your risk of addiction. This addiction can also be called opioid use disorder. What are the risks? Using opioid pain medicines for longer than 3 days increases your risk of side effects. Side effects include: Constipation. Nausea and vomiting. Breathing difficulties (respiratory depression). Drowsiness. Confusion. Opioid use disorder. Itching. Taking opioid pain medicine for a long period of time can affect your ability to do daily tasks. It also puts you at  risk for: Motor vehicle crashes. Depression. Suicide. Heart attack. Overdose, which can be life-threatening. What is a pain treatment plan? A pain treatment plan is an agreement between you and your health care provider. Pain is unique to each person, and treatments vary depending on your condition. To manage your pain, you and your health care provider need to work together. To help you do this: Discuss the goals of your treatment, including how much pain you might expect to have and how you will manage the pain. Review the risks and benefits of taking opioid medicines. Remember that a good treatment plan uses more than one approach and minimizes the chance of side effects. Be honest about the amount of medicines you take and about any drug or alcohol use. Get pain medicine prescriptions from only one health care provider. Pain can be managed with many types of alternative treatments. Ask your health care provider to refer you to one or more specialists who can help you manage pain through: Physical or occupational therapy. Counseling (cognitive behavioral therapy). Good nutrition. Biofeedback. Massage. Meditation. Non-opioid medicine. Following a gentle exercise program. How to use opioid pain medicine Taking medicine Take your pain medicine exactly as told by your health care provider. Take it only when you need it. If your pain gets less severe, you may take less than your prescribed dose if your health care  provider approves. If you are not having pain, do nottake pain medicine unless your health care provider tells you to take it. If your pain is severe, do nottry to treat it yourself by taking more pills than instructed on your prescription. Contact your health care provider for help. Write down the times when you take your pain medicine. It is easy to become confused while on pain medicine. Writing the time can help you avoid overdose. Take other over-the-counter or prescription medicines only as told by your health care provider. Keeping yourself and others safe  While you are taking opioid pain medicine: Do not drive, use machinery, or power tools. Do not sign legal documents. Do not drink alcohol. Do not take sleeping pills. Do not supervise children by yourself. Do not do activities that require climbing or being in high places. Do not go to a lake, river, ocean, spa, or swimming pool. Do not share your pain medicine with anyone. Keep pain medicine in a locked cabinet or in a secure area where pets and children cannot reach it. Stopping your use of opioids If you have been taking opioid medicine for more than a few weeks, you may need to slowly decrease (taper) how much you take until you stop completely. Tapering your use of opioids can decrease your risk of symptoms of withdrawal, such as: Pain and cramping in the abdomen. Nausea. Sweating. Sleepiness. Restlessness. Uncontrollable shaking (tremors). Cravings for the medicine. Do not attempt to taper your use of opioids on your own. Talk with your health care provider about how to do this. Your health care provider may prescribe a step-down schedule based on how much medicine you are taking and how long you have been taking it. Getting rid of leftover pills Do not save any leftover pills. Get rid of leftover pills safely by: Taking the medicine to a prescription take-back program. This is usually offered by the county or law  enforcement. Bringing them to a pharmacy that has a drug disposal container. Flushing them down the toilet. Check the label or package insert of your medicine to see whether this is  safe to do. Throwing them out in the trash. Check the label or package insert of your medicine to see whether this is safe to do. If it is safe to throw it out, remove the medicine from the original container, put it into a sealable bag or container, and mix it with used coffee grounds, food scraps, dirt, or cat litter before putting it in the trash. Follow these instructions at home: Activity Do exercises as told by your health care provider. Avoid activities that make your pain worse. Return to your normal activities as told by your health care provider. Ask your health care provider what activities are safe for you. General instructions You may need to take these actions to prevent or treat constipation: Drink enough fluid to keep your urine pale yellow. Take over-the-counter or prescription medicines. Eat foods that are high in fiber, such as beans, whole grains, and fresh fruits and vegetables. Limit foods that are high in fat and processed sugars, such as fried or sweet foods. Keep all follow-up visits. This is important. Where to find support If you have been taking opioids for a long time, you may benefit from receiving support for quitting from a local support group or counselor. Ask your health care provider for a referral to these resources in your area. Where to find more information Centers for Disease Control and Prevention (CDC): http://www.wolf.info/ [place] Food and Drug Administration (FDA): [place] Get help right away if: You may have taken too much of an opioid (overdosed). Common symptoms of an overdose: Your breathing is slower or more shallow than normal. You have a very slow heartbeat (pulse). You have slurred speech. You have nausea and vomiting. Your pupils become very small. You have other  potential symptoms: You are very confused. You faint or feel like you will faint. You have cold, clammy skin. You have blue lips or fingernails. You have thoughts of harming yourself or harming others. These symptoms may represent a serious problem that is an emergency. Do not wait to see if the symptoms will go away. Get medical help right away. Call your local emergency services (911 in the [place] Do not drive yourself to the hospital.  If you ever feel like you may hurt yourself or others, or have thoughts about taking your own life, get help right away. Go to your nearest emergency department or: Call your local emergency services (911 in the [place] Call the [facility] 774-758-0781 in the [place] Call a suicide crisis helpline, such as the [place] at (925)059-8680 or 988 in the [place]. This is open 24 hours a day in the [place] Text the Crisis Text Line at (825)502-2965 (in the [place].). Summary Opioid medicines can help you manage moderate to severe pain for a short period of time. A pain treatment plan is an agreement between you and your health care provider. Discuss the goals of your treatment, including how much pain you might expect to have and how you will manage the pain. If you think that you or someone else may have taken too much of an opioid, get medical help right away. This information is not intended to replace advice given to you by your health care provider. Make sure you discuss any questions you have with your health care provider. Document Revised: 11/28/2020 Document Reviewed: 08/15/2020 Elsevier Patient Education  [place].

## 2022-03-12 NOTE — Progress Notes (Cosign Needed Addendum)
Subjective:   Kara [name] is a 88 y.o. female who presents for Medicare Annual (Subsequent) preventive examination.  Review of Systems    No ROS.  Medicare Wellness Virtual Visit.  Visual/audio telehealth visit, UTA vital signs.   See social history for additional risk factors.   Cardiac Risk Factors include: advanced age (>34mn, >>31women);hypertension;diabetes mellitus     Objective:    Today's Vitals   03/12/22 1132  Weight: 126 lb (57.2 kg)  Height: '5\' 1"'$  (1.549 m)   Body mass index is 23.81 kg/m.     03/12/2022   11:48 AM 11/13/2020   12:57 PM 11/09/2020   11:21 AM 07/23/2020    3:48 PM 07/13/2020    3:16 PM 10/28/2019   11:12 AM 06/09/2019   10:07 PM  Advanced Directives  Does Patient Have a Medical Advance Directive? Yes No No Yes Yes Yes Yes  Type of AParamed[place]of [place]   [place] will;Out of facility DNR (pink MOST or yellow form) Out of facility DNR (pink MOST or yellow form) [place] ForestLiving will [place] will  Does patient want to make changes to medical advance directive? No - Patient declined   No - Patient declined No - Patient declined No - Patient declined No - Patient declined  Copy of [place] Chart? No - copy requested   No - copy requested  No - copy requested No - copy requested  Would patient like information on creating a medical advance directive?     No - Patient declined    Pre-existing out of facility DNR order (yellow form or pink MOST form)     Pink Most/Yellow Form available - Physician notified to receive inpatient order      Current Medications (verified) Outpatient Encounter Medications as of 03/12/2022  Medication Sig   acetaminophen (TYLENOL) 500 MG tablet Take 2 tablets (1,000 mg total) by mouth every 12 (twelve) hours.   Cholecalciferol (VITAMIN D) 50 MCG (2000 UT) CAPS Take 1 capsule by mouth daily at 2  am.   Cranberry 250 MG TABS Take 2 tablets by mouth daily at 2 am.   cyanocobalamin (,VITAMIN B-12,) 1000 MCG/ML injection INJECT 1ML INTO THE MUSCLE EVERY 14 DAYS   diclofenac Sodium (VOLTAREN) 1 % GEL Apply 2 g topically 4 (four) times daily. To painful joints as needed   diphenoxylate-atropine (LOMOTIL) 2.5-0.025 MG tablet TAKE 1 TABLET BY MOUTH THREE TIMES DAILYAS NEEDED FOR DIARRHEA   estradiol (ESTRACE) 0.1 MG/GM vaginal cream Place a dab of cream around the urethra nightly for 2 weeks,  then twice weekly thereafter   [place]([facility] OINT Apply 1 application topically as needed.   Lidocaine (SALONPAS PAIN RELIEVING) 4 % PTCH Apply 1 patch daily to your back and upper arm.   metaxalone (SKELAXIN) 800 MG tablet Take 400 mg by mouth 3 (three) times daily as needed for muscle spasms.   metoprolol succinate (TOPROL-XL) 25 MG 24 hr tablet TAKE 1 TABLET BY MOUTH IN THE MORNING AND AT BEDTIME   ondansetron (ZOFRAN) 4 MG tablet Take 1 tablet (4 mg total) by mouth every 6 (six) hours as needed for nausea. (Patient not taking: Reported on 01/27/2022)   traMADol (ULTRAM) 50 MG tablet Take 1 tablet (50 mg total) by mouth every 12 (twelve) hours as needed for moderate pain.   triamcinolone (KENALOG) 0.1 % APPLY 1 APPLICATION TOPICALLY 2 TIMES DAILY  TO VULVA UNTIL ITCHING RESOLVES THEN REDUCE USE TO TWICE WEEKLY.   No facility-administered encounter medications on file as of 03/12/2022.    Allergies (verified) Baycol [cerivastatin sodium], Cardizem [diltiazem hcl], Crestor [rosuvastatin calcium], Dronedarone, Metoprolol tartrate, Omeprazole, Pravachol, Statins, Vioxx [rofecoxib], and Zocor [simvastatin]   History: Past Medical History:  Diagnosis Date   Allergy    Atrial fibrillation (HCC)    CAD (coronary artery disease)    Cancer ([place])    UTERINE   Chronic anticoagulation    Chronic diarrhea    Closed compression fracture of L2 lumbar vertebra, initial encounter ([place])  11/08/2020   By CT scan done to evaluate severe persistent low back pain:  Impression below also notes spinal compression from bulging disk.     Superior endplate compression fracture of L2 with approximately 25% height loss and minimal, 2-3 mm retropulsion of the superior endplate.   Left-sided, nondisplaced zone 1 sacral insufficiency fracture.   Multilevel degenerative changes of the lumbar spine resulting    Closed intertrochanteric fracture of left femur ([place]) 08/22/2020   Decubitus ulcer    sacral region   Diabetes ([place])    diet controlled   Dysuria    E. coli UTI 11/21/2014   Edema of lower extremity    mainly right foot, slightly in left foot.   Facial fracture due to fall ([place]) 07/13/2020   Femur fracture, left ([place]) 06/09/2019   Fibrocystic breast disease    GERD (gastroesophageal reflux disease)    Glaucoma    Glaucoma    Hematuria    Hemorrhoids    History of colon polyps    History of pancreatitis    Hospital discharge follow-up 07/22/2020   Hyperlipidemia    Hypertension    Hypokalemia    IBS (irritable bowel syndrome)    Microscopic hematuria    Mitral valve regurgitation    Orbital fracture ([place]) 07/13/2020   Osteoarthritis    fingers   Pernicious anemia    Plantar fasciitis    Recurrent UTI    Skin cancer    Sleep apnea, obstructive    Umbilical hernia without obstruction and without gangrene 06/26/2015   Vaginal atrophy    Vitamin D deficiency    Yeast vaginitis    Past Surgical History:  Procedure Laterality Date   ABDOMINAL HYSTERECTOMY  1980's   ABDOMINAL SURGERY     for villous polyp,,,many years ago   APPENDECTOMY  1940's   ASCAD, s/p PTCA  11/28/2005   MID LESION    BREAST BIOPSY Left 1970's   CARPAL TUNNEL RELEASE Right 12/13/2015   Procedure: CARPAL TUNNEL RELEASE;  Surgeon: Thornton Park, MD;  Location: [facility] ORS;  Service: Orthopedics;  Laterality: Right;   COLECTOMY  2015   INTRAMEDULLARY (IM) NAIL INTERTROCHANTERIC Left 06/11/2019   Procedure:  INTRAMEDULLARY (IM) NAIL INTERTROCHANTRIC;  Surgeon: Earnestine Leys, MD;  Location: [facility] ORS;  Service: Orthopedics;  Laterality: Left;   KYPHOPLASTY N/A 11/15/2020   Procedure: KYPHOPLASTY, L2;  Surgeon: Hessie Knows, MD;  Location: [facility] ORS;  Service: Orthopedics;  Laterality: N/A;   PACEMAKER PLACEMENT     radation     for uterine cance   REFRACTIVE SURGERY     for bilateral glaumoma   SACROPLASTY N/A 11/15/2020   Procedure: SACROPLASTY;  Surgeon: Hessie Knows, MD;  Location: [facility] ORS;  Service: Orthopedics;  Laterality: N/A;   TONSILLECTOMY  1936   Family History  Problem Relation Age of Onset   Coronary artery disease Father  Kidney disease Father    Cancer Sister    Diabetes Other    Cancer Mother        COLON   Bladder Cancer Neg Hx    Breast cancer Neg Hx    Social History   Socioeconomic History   Marital status: Widowed    Spouse name: Not on file   Number of children: 3   Years of education: Not on file   Highest education level: Not on file  Occupational History   Occupation: Retired Restaurant manager, fast food - primary  Tobacco Use   Smoking status: Former   Smokeless tobacco: Never   Tobacco comments:    quit 45 years ago  Vaping Use   Vaping Use: Never used  Substance and Sexual Activity   Alcohol use: Yes    Alcohol/week: 0.0 standard drinks of alcohol    Comment: Rarely, 1-2 times a year   Drug use: No   Sexual activity: Not Currently    Birth control/protection: Surgical  Other Topics Concern   Not on file  Social History Narrative   ** Merged History Encounter **       Lives at [place]. Born in [place]. Travels frequently.      1 daughter, 2 step children      Regular Exercise -  NO   Daily Caffeine Use:  1 coffee            Social Determinants of Health   Financial Resource Strain: Low Risk  (03/12/2022)   Overall Financial Resource Strain (CARDIA)    Difficulty of Paying Living Expenses: Not hard at all  Food Insecurity:  No Food Insecurity (03/12/2022)   Hunger Vital Sign    Worried About Running Out of Food in the Last Year: Never true    Ran Out of Food in the Last Year: Never true  Transportation Needs: No Transportation Needs (03/12/2022)   PRAPARE - Hydrologist (Medical): No    Lack of Transportation (Non-Medical): No  Physical Activity: Insufficiently Active (01/10/2022)   Exercise Vital Sign    Days of Exercise per Week: 2 days    Minutes of Exercise per Session: 10 min  Stress: No Stress Concern Present (03/12/2022)   [place]    Feeling of Stress : Not at all  Social Connections: Moderately Isolated (03/12/2022)   Social Connection and Isolation Panel [NHANES]    Frequency of Communication with Friends and Family: More than three times a week    Frequency of Social Gatherings with Friends and Family: More than three times a week    Attends Religious Services: Never    Marine scientist or Organizations: Yes    Attends Music therapist: More than 4 times per year    Marital Status: Widowed    Tobacco Counseling Counseling given: Not Answered Tobacco comments: quit 45 years ago   Clinical Intake:  Pre-visit preparation completed: Yes        Diabetes:  (Followed by PCP)  How often do you need to have someone help you when you read instructions, pamphlets, or other written materials from your doctor or pharmacy?: 2 - Rarely   Interpreter Needed?: No      Activities of Daily Living    03/12/2022   11:50 AM  In your present state of health, do you have any difficulty performing the following activities:  Hearing? 0  Vision?  0  Difficulty concentrating or making decisions? 0  Comment Age appropriate  Walking or climbing stairs? 1  Comment Managed with walker  Dressing or bathing? 0  Doing errands, shopping? 1  Comment [place] and eating  ? N  Comment Staff meal prep. Self feeds.  Using the Toilet? N  In the past six months, have you accidently leaked urine? Y  Comment Wears daily pad  Do you have problems with loss of bowel control? N  Managing your Medications? N  Managing your Finances? N  Housekeeping or managing your Housekeeping? Y  Comment Once weekly    Patient Care Team: Crecencio Mc, MD as PCP - General (Internal Medicine) Crecencio Mc, MD (Internal Medicine)  Indicate any recent Medical Services you may have received from other than [facility] providers in the past year (date may be approximate).     Assessment:   This is a routine wellness examination for Kara [name].  I connected with  Kara [name] on 03/12/22 by a audio enabled telemedicine application and verified that I am speaking with the correct person using two identifiers.  Patient Location: Home  Provider Location: Office/Clinic  I discussed the limitations of evaluation and management by telemedicine. The patient expressed understanding and agreed to proceed.   Hearing/Vision screen Hearing Screening - Comments:: Patient is able to hear conversational tones without difficulty. No issues reported. Vision Screening - Comments:: Followed by [facility] Wears corrective lenses Cataract extraction, bilateral They have regular follow up with the ophthalmologist  Dietary issues and exercise activities discussed: Current Exercise Habits: Home exercise routine, Type of exercise: walking, Intensity: Mild   Goals Addressed               This Visit's Progress     Patient Stated     DIET - REDUCE SUGAR INTAKE (pt-stated)        Monitor diet Stay active       Depression Screen    03/12/2022   11:43 AM 01/10/2022    5:24 PM 11/18/2021   11:55 AM 05/21/2021    1:22 PM 10/19/2020   11:41 AM 07/23/2020    4:08 PM 10/28/2019   11:14 AM  PHQ 2/9 Scores  PHQ - 2 Score 0 0 1 0 0 0 0    Fall Risk    03/12/2022   11:49 AM 11/18/2021    11:55 AM 10/10/2021   11:52 AM 07/30/2021    4:47 PM 05/21/2021    1:22 PM  Fall Risk   Falls in the past year? 0 0 0 0 0  Number falls in past yr: 0    0  Injury with Fall? 0    0  Risk for fall due to : Impaired balance/gait No Fall Risks History of fall(s) History of fall(s) No Fall Risks  Risk for fall due to: Comment Walker in use      Follow up Falls evaluation completed Falls evaluation completed Falls evaluation completed Falls evaluation completed Falls evaluation completed    [place]: Home free of loose throw rugs in walkways, pet beds, electrical cords, etc? Yes  Adequate lighting in your home to reduce risk of falls? Yes   ASSISTIVE DEVICES UTILIZED TO PREVENT FALLS: Life alert? Yes  Use of a cane, walker or w/c? Yes , rollator walker Grab bars in the bathroom? Yes  Shower chair or bench in shower? Yes  Elevated toilet seat or a  handicapped toilet? No   TIMED UP AND GO: Was the test performed? No .   Cognitive Function:    08/01/2015    1:51 PM  MMSE - Mini Mental State Exam  Orientation to time 5  Orientation to Place 5  Registration 3  Attention/ Calculation 5  Recall 3  Language- name 2 objects 2  Language- repeat 1  Language- follow 3 step command 3  Language- read & follow direction 1  Write a sentence 1  Copy design 1  Total score 30        03/12/2022   11:55 AM 10/28/2019   11:34 AM 10/27/2018   10:11 AM 10/16/2017   10:48 AM 10/14/2016    3:27 PM  6CIT Screen  What Year? 0 points 0 points 0 points 0 points 0 points  What month? 0 points 0 points 0 points 0 points 0 points  What time? 0 points 0 points 0 points 0 points 0 points  Count back from 20 0 points 0 points 0 points 0 points 0 points  Months in reverse 0 points 0 points 0 points 0 points 0 points  Repeat phrase  4 points 0 points  0 points  Total Score  4 points 0 points  0 points    Immunizations Immunization History  Administered Date(s)  Administered   Fluad Quad(high Dose 65+) 02/04/2019   Influenza Whole 02/28/2013   Influenza,inj,Quad PF,6+ Mos 01/29/2015   Influenza-Unspecified 02/19/2010, 03/17/2012, 03/02/2014, 02/09/2016, 01/25/2017, 03/12/2018, 02/26/2020, 03/06/2021   PFIZER(Purple Top)SARS-COV-2 Vaccination 06/01/2019, 06/22/2019   Pneumococcal Conjugate-13 09/01/2013   Pneumococcal Polysaccharide-23 04/07/2017   Pneumococcal-Unspecified 04/15/2007   Tdap 11/11/2011, 07/13/2020   Zoster, Live 08/31/2009, 10/12/2011   Flu Vaccine status: Due, Education has been provided regarding the importance of this vaccine. Advised may receive this vaccine at local pharmacy or Health Dept. Aware to provide a copy of the vaccination record if obtained from local pharmacy or Health Dept. Verbalized acceptance and understanding.  Covid-19 vaccine status: Completed vaccines  Shingrix Completed?: No.    Education has been provided regarding the importance of this vaccine. Patient has been advised to call insurance company to determine out of pocket expense if they have not yet received this vaccine. Advised may also receive vaccine at local pharmacy or Health Dept. Verbalized acceptance and understanding.  Screening Tests Health Maintenance  Topic Date Due   COVID-19 Vaccine (3 - Pfizer risk series) 03/28/2022 (Originally 07/20/2019)   Zoster Vaccines- Shingrix (1 of 2) 06/12/2022 (Originally 09/24/1941)   INFLUENZA VACCINE  08/17/2022 (Originally 12/17/2021)   HEMOGLOBIN A1C  05/21/2022   OPHTHALMOLOGY EXAM  10/03/2022   FOOT EXAM  11/19/2022   Medicare Annual Wellness (AWV)  04/12/2023   TETANUS/TDAP  07/13/2030   Pneumonia Vaccine 52+ Years old  Completed   DEXA SCAN  Completed   HPV VACCINES  Aged Out    Health Maintenance  There are no preventive care reminders to display for this patient.  Lung Cancer Screening: (Low Dose CT Chest recommended if Age 53-80 years, 30 pack-year currently smoking OR have quit w/in 15years.)  does not qualify.   Hepatitis C Screening: does not qualify  Vision Screening: Recommended annual ophthalmology exams for early detection of glaucoma and other disorders of the eye.  Dental Screening: Recommended annual dental exams for proper oral hygiene  Community Resource Referral / Chronic Care Management: CRR required this visit?  No   CCM required this visit?  No      Plan:  I have personally reviewed and noted the following in the patient's chart:   Medical and social history Use of alcohol, tobacco or illicit drugs  Current medications and supplements including opioid prescriptions. Patient is currently taking opioid prescriptions. Information provided to patient regarding non-opioid alternatives. Patient advised to discuss non-opioid treatment plan with their provider. Taking Tramadol, followed by PCP Functional ability and status Nutritional status Physical activity Advanced directives List of other physicians Hospitalizations, surgeries, and ER visits in previous 12 months Vitals Screenings to include cognitive, depression, and falls Referrals and appointments  In addition, I have reviewed and discussed with patient certain preventive protocols, quality metrics, and best practice recommendations. A written personalized care plan for preventive services as well as general preventive health recommendations were provided to patient.     [place], LPN   81/19/1478     I have reviewed the above information and agree with above.   Deborra Medina, MD

## 2022-03-17 ENCOUNTER — Encounter (INDEPENDENT_AMBULATORY_CARE_PROVIDER_SITE_OTHER): Payer: Self-pay

## 2022-03-20 ENCOUNTER — Ambulatory Visit: Payer: Self-pay

## 2022-03-20 NOTE — Patient Outreach (Signed)
  Care Coordination   Follow Up Visit Note   03/20/2022 Name: Kara [name] MRN: [id number] DOB: Jan 09, 1923  Kara [name] is a 88 y.o. year old female who sees Derrel Nip, Aris Everts, MD for primary care. I spoke with  [name] by phone today.  What matters to the patients health and wellness today?  Patient states her leg/ back pain is not as bad as it was. She states she continues to take her tylenol daily for pain management. Patient denies any falls since last outreach with RNCM.     Goals Addressed             This Visit's Progress    Patient Stated: Manage my leg pain/ swelling and back pain       Care Coordination Interventions: Evaluation of current treatment plan related to leg / back pain and patient's adherence to plan as established by provider Reviewed medications with patient and discussed Importance of compliance. Advised to take pain medications as prescribed by provider.  Reviewed scheduled/upcoming provider appointments  Confirmed patient had follow up appointments listed on her calendar Advised to elevate legs when sitting, apply heat as needed, and continue to follow a low salt diet.  Assessed for falls since last outreach with RNCM Discussed fall precautions Advised to get up and move around more and not sit for long periods of time since sitting causes increase pain in back.            SDOH assessments and interventions completed:  No     Care Coordination Interventions Activated:  Yes  Care Coordination Interventions:  Yes, provided   Follow up plan: Follow up call scheduled for 06/11/21 at 1:00 pm    Encounter Outcome:  Pt. Visit Completed   Quinn Plowman RN,BSN,CCM [place] [phone] direct line

## 2022-04-01 ENCOUNTER — Telehealth: Payer: Self-pay

## 2022-04-01 DIAGNOSIS — R350 Frequency of micturition: Secondary | ICD-10-CM

## 2022-04-01 NOTE — Telephone Encounter (Signed)
Patient states she believes she has a UTI and would like to see if she can bring in a sample without having an office visit.  Patient states she has done this in the past.  Patient states she would like to have a call back.

## 2022-04-01 NOTE — Telephone Encounter (Signed)
Labs have been ordered

## 2022-04-02 ENCOUNTER — Other Ambulatory Visit (INDEPENDENT_AMBULATORY_CARE_PROVIDER_SITE_OTHER): Payer: Medicare Other

## 2022-04-02 DIAGNOSIS — R350 Frequency of micturition: Secondary | ICD-10-CM | POA: Diagnosis not present

## 2022-04-02 DIAGNOSIS — B9620 Unspecified Escherichia coli [E. coli] as the cause of diseases classified elsewhere: Secondary | ICD-10-CM

## 2022-04-02 LAB — URINALYSIS, ROUTINE W REFLEX MICROSCOPIC
Bilirubin Urine: NEGATIVE
Ketones, ur: NEGATIVE
Nitrite: NEGATIVE
Specific Gravity, Urine: 1.01 (ref 1.000–1.030)
Urine Glucose: NEGATIVE
Urobilinogen, UA: 0.2 (ref 0.0–1.0)
pH: 6 (ref 5.0–8.0)

## 2022-04-03 DIAGNOSIS — D1801 Hemangioma of skin and subcutaneous tissue: Secondary | ICD-10-CM | POA: Diagnosis not present

## 2022-04-03 DIAGNOSIS — L821 Other seborrheic keratosis: Secondary | ICD-10-CM | POA: Diagnosis not present

## 2022-04-03 DIAGNOSIS — L218 Other seborrheic dermatitis: Secondary | ICD-10-CM | POA: Diagnosis not present

## 2022-04-04 ENCOUNTER — Telehealth: Payer: Self-pay | Admitting: Internal Medicine

## 2022-04-04 MED ORDER — CIPROFLOXACIN HCL 250 MG PO TABS: 250.0000 mg | ORAL_TABLET | Freq: Two times a day (BID) | ORAL | 0 refills | Status: AC

## 2022-04-04 NOTE — Telephone Encounter (Signed)
Looks like the culture has resulted.

## 2022-04-04 NOTE — Telephone Encounter (Signed)
Patient states she is following up with [place] to see if we have the results of her urine specimen yet.  Patient states she would like to go ahead and start taking something if needed instead of waiting over the weekend.  Patient states she was very uncomfortable when she got up in the middle of the night to go to the bathroom and her urine was cloudy this morning and she was experiencing pain.  Patient states she would like to hear from [place] way before 5pm today if possible.

## 2022-04-04 NOTE — Telephone Encounter (Signed)
Spoke with pt and informed her of her lab results and that the medication has been sent in. Pt gave a verbal understanding.

## 2022-04-04 NOTE — Telephone Encounter (Signed)
error 

## 2022-04-04 NOTE — Telephone Encounter (Signed)
Pt called wanting to know her lab results

## 2022-04-04 NOTE — Telephone Encounter (Signed)
[facility]  Daily use of Probiotics for  3 weeks advised to reduce risk of C dificile colitis.

## 2022-04-05 LAB — URINE CULTURE
MICRO NUMBER:: 14193090
SPECIMEN QUALITY:: ADEQUATE

## 2022-04-06 MED ORDER — CEFDINIR 300 MG PO CAPS: 300.0000 mg | ORAL_CAPSULE | Freq: Two times a day (BID) | ORAL | 0 refills | Status: DC

## 2022-04-06 NOTE — Assessment & Plan Note (Signed)
Changing avx from cipro to cefdinir

## 2022-04-06 NOTE — Addendum Note (Signed)
Addended by: Crecencio Mc on: 04/06/2022 05:57 PM   Modules accepted: Orders

## 2022-04-07 NOTE — Telephone Encounter (Signed)
Pt would like to be called in regards to a medication she picked up from the pharmacy for her UTI

## 2022-04-07 NOTE — Telephone Encounter (Signed)
Spoke with pt to let her know that she received a new antibiotic because the Cipro that was sent in last week will not cure her UTI. Pt was advised that she should stop the Cipro and start the Cefdnir. Pt gave a verbal understanding.

## 2022-04-17 ENCOUNTER — Telehealth: Payer: Self-pay

## 2022-04-17 MED ORDER — FLUCONAZOLE 150 MG PO TABS: 150.0000 mg | ORAL_TABLET | Freq: Every day | ORAL | 0 refills | Status: DC

## 2022-04-17 NOTE — Telephone Encounter (Signed)
Patient states has taken the antibiotic we gave her and has been using the cream we gave her.  Patient states she is experiencing itching in her vagina.  Patient states her urine is still a little bit yellow off and on.

## 2022-04-17 NOTE — Telephone Encounter (Signed)
LVM to inform pt that Dr Derrel Nip has sent rx into pharmacy and if this does not work then we can set an appt for an in person visit!

## 2022-04-18 NOTE — Telephone Encounter (Signed)
Pt called stating the medication is not working

## 2022-04-18 NOTE — Telephone Encounter (Addendum)
Spoke to pt and scheduled appt for Tues 04/22/22 @ 10:15

## 2022-04-22 ENCOUNTER — Ambulatory Visit (INDEPENDENT_AMBULATORY_CARE_PROVIDER_SITE_OTHER): Payer: Medicare Other | Admitting: Internal Medicine

## 2022-04-22 ENCOUNTER — Encounter: Payer: Self-pay | Admitting: Internal Medicine

## 2022-04-22 VITALS — BP 142/76 | HR 87 | Ht 61.0 in | Wt 129.4 lb

## 2022-04-22 DIAGNOSIS — N9089 Other specified noninflammatory disorders of vulva and perineum: Secondary | ICD-10-CM

## 2022-04-22 MED ORDER — TRIAMCINOLONE ACETONIDE 0.1 % EX CREA: TOPICAL_CREAM | CUTANEOUS | 1 refills | Status: DC

## 2022-04-22 NOTE — Patient Instructions (Signed)
Your vulva is very irritated .  Please resume using triamcinolone cream 2 times daily until the redness and/or itching resolves,  After you apply the triamcinolone, please add a layer of [place] to protect your vulva from the urine on your pad .  This should be reapplied whenever you use the rest room if you have wiped it off   Continue using Estradiol cream twice weekly for ever

## 2022-04-22 NOTE — Progress Notes (Signed)
`  Subjective:  Patient ID: Kara [name], female    DOB: 1922/08/24  Age: 88 y.o. MRN: [id number]  CC: The encounter diagnosis was Vulvar irritation.   HPI Kara [name] presents for evaluation of vaginal itching  Chief Complaint  Patient presents with   Follow-up    Vaginal itching   87 yr old female with urinary incontinence, atrophic vaginitis,  frequent UTIs presents for pelvic exam after vaginal itching failed to improve with orla fluconazole.  She reports that she changes her incontinence pad whenever she feels it is moist,  averaging 2-3 pads per 24 hours. She is no longer using triamcinolone ointment which was prescribed over a year ago but is using estradiol cream.  She denies vaginal discharge.    Outpatient Medications Prior to Visit  Medication Sig Dispense Refill   acetaminophen (TYLENOL) 500 MG tablet Take 2 tablets (1,000 mg total) by mouth every 12 (twelve) hours. 60 tablet 11   Cholecalciferol (VITAMIN D) 50 MCG (2000 UT) CAPS Take 1 capsule by mouth daily at 2 am.     Cranberry 250 MG TABS Take 2 tablets by mouth daily at 2 am.     cyanocobalamin (,VITAMIN B-12,) 1000 MCG/ML injection INJECT 1ML INTO THE MUSCLE EVERY 14 DAYS 10 mL 0   diclofenac Sodium (VOLTAREN) 1 % GEL Apply 2 g topically 4 (four) times daily. To painful joints as needed 100 g 5   diphenoxylate-atropine (LOMOTIL) 2.5-0.025 MG tablet TAKE 1 TABLET BY MOUTH THREE TIMES DAILYAS NEEDED FOR DIARRHEA 90 tablet 2   estradiol (ESTRACE) 0.1 MG/GM vaginal cream Place a dab of cream around the urethra nightly for 2 weeks,  then twice weekly thereafter 42.5 g 1   Infant Care Products (DERMACLOUD) OINT Apply 1 application topically as needed. 430 g 1   ketoconazole (NIZORAL) 2 % cream Apply topically.     Lidocaine (SALONPAS PAIN RELIEVING) 4 % PTCH Apply 1 patch daily to your back and upper arm. 90 patch 1   metoprolol succinate (TOPROL-XL) 25 MG 24 hr tablet TAKE 1 TABLET BY MOUTH IN THE MORNING AND AT BEDTIME  180 tablet 0   ondansetron (ZOFRAN) 4 MG tablet Take 1 tablet (4 mg total) by mouth every 6 (six) hours as needed for nausea. 20 tablet 0   traMADol (ULTRAM) 50 MG tablet Take 1 tablet (50 mg total) by mouth every 12 (twelve) hours as needed for moderate pain. 60 tablet 2   cefdinir (OMNICEF) 300 MG capsule Take 1 capsule (300 mg total) by mouth 2 (two) times daily. (Patient not taking: Reported on 04/22/2022) 6 capsule 0   fluconazole (DIFLUCAN) 150 MG tablet Take 1 tablet (150 mg total) by mouth daily. (Patient not taking: Reported on 04/22/2022) 2 tablet 0   metaxalone (SKELAXIN) 800 MG tablet Take 400 mg by mouth 3 (three) times daily as needed for muscle spasms. (Patient not taking: Reported on 04/22/2022)     triamcinolone (KENALOG) 0.1 % APPLY 1 APPLICATION TOPICALLY 2 TIMES DAILY TO VULVA UNTIL ITCHING RESOLVES THEN REDUCE USE TO TWICE WEEKLY. (Patient not taking: Reported on 04/22/2022) 15 g 1   No facility-administered medications prior to visit.    Review of Systems;  Patient denies headache, fevers, malaise, unintentional weight loss, skin rash, eye pain, sinus congestion and sinus pain, sore throat, dysphagia,  hemoptysis , cough, dyspnea, wheezing, chest pain, palpitations, orthopnea, edema, abdominal pain, nausea, melena, diarrhea, constipation, flank pain, dysuria, hematuria, urinary  Frequency, nocturia, numbness, tingling, seizures,  Focal weakness, Loss of consciousness,  Tremor, insomnia, depression, anxiety, and suicidal ideation.      Objective:  BP (!) 142/76   Pulse 87   Ht '5\' 1"'$  (1.549 m)   Wt 129 lb 6.4 oz (58.7 kg)   SpO2 94%   BMI 24.45 kg/m   BP Readings from Last 3 Encounters:  04/22/22 (!) 142/76  01/16/22 (!) 147/70  01/07/22 (!) 178/73    Wt Readings from Last 3 Encounters:  04/22/22 129 lb 6.4 oz (58.7 kg)  03/12/22 126 lb (57.2 kg)  01/16/22 126 lb 12.8 oz (57.5 kg)    General appearance: alert, cooperative and appears stated age Ears: normal TM's  and external ear canals both ears Throat: lips, mucosa, and tongue normal; teeth and gums normal Neck: no adenopathy, no carotid bruit, supple, symmetrical, trachea midline and thyroid not enlarged, symmetric, no tenderness/mass/nodules Back: symmetric, no curvature. ROM normal. No CVA tenderness. Lungs: clear to auscultation bilaterally Heart: regular rate and rhythm, S1, S2 normal, no murmur, click, rub or gallop Abdomen: soft, non-tender; bowel sounds normal; no masses,  no organomegaly Pulses: 2+ and symmetric Skin: Skin  of vulva is beet red,  slightly swollen,  vaginal vault without discharge.  Lymph nodes: Cervical, supraclavicular, and axillary nodes normal. Neuro:  awake and interactive with normal mood and affect. Higher cortical functions are normal. Speech is clear without word-finding difficulty or dysarthria. Extraocular movements are intact. Visual fields of both eyes are grossly intact. Sensation to light touch is grossly intact bilaterally of upper and lower extremities. Motor examination shows 4+/5 symmetric hand grip and upper extremity and 5/5 lower extremity strength. There is no pronation or drift. Gait is antalgic    Lab Results  Component Value Date   HGBA1C 6.8 (H) 11/18/2021   HGBA1C 6.4 02/14/2021   HGBA1C 7.2 (H) 11/14/2020    Lab Results  Component Value Date   CREATININE 0.90 11/18/2021   CREATININE 0.79 02/14/2021   CREATININE 0.79 02/14/2021    Lab Results  Component Value Date   WBC 7.0 11/18/2021   HGB 13.9 11/18/2021   HCT 41.7 11/18/2021   PLT 136.0 (L) 11/18/2021   GLUCOSE 176 (H) 11/18/2021   CHOL 160 11/18/2021   TRIG 264.0 (H) 11/18/2021   HDL 48.70 11/18/2021   LDLDIRECT 74.0 11/18/2021   LDLCALC 60 10/11/2013   ALT 13 11/18/2021   AST 20 11/18/2021   NA 140 11/18/2021   K 3.9 11/18/2021   CL 100 11/18/2021   CREATININE 0.90 11/18/2021   BUN 24 (H) 11/18/2021   CO2 28 11/18/2021   TSH 3.15 12/29/2019   INR 1.1 07/13/2020    HGBA1C 6.8 (H) 11/18/2021   MICROALBUR 0.8 02/14/2021      Assessment & Plan:   Problem List Items Addressed This Visit     Vulvar irritation - Primary    Not responding to empiric treatment of candida.  Appears to be due to contact with urine.  Triamcinolone twice daily until irritation resolves.  She has also been advised to apply a layer of dermacloud to her vulva several times daily to protect herself from the urine on her pad        I spent a total of   minutes with this patient in a face to face visit on the date of this encounter reviewing the last office visit with me in       ,  most recent visit with cardiology ,    ,  patient's diet and exercise habits, home blood pressure /blod sugar readings, recent ER visit including labs and imaging studies ,   and post visit ordering of testing and therapeutics.    Follow-up: No follow-ups on file.   Crecencio Mc, MD

## 2022-04-22 NOTE — Assessment & Plan Note (Signed)
Not responding to empiric treatment of candida.  Appears to be due to contact with urine.  Triamcinolone twice daily until irritation resolves.  She has also been advised to apply a layer of dermacloud to her vulva several times daily to protect herself from the urine on her pad

## 2022-04-24 ENCOUNTER — Encounter: Payer: Self-pay | Admitting: Internal Medicine

## 2022-04-24 DIAGNOSIS — R2689 Other abnormalities of gait and mobility: Secondary | ICD-10-CM

## 2022-04-24 DIAGNOSIS — M545 Low back pain, unspecified: Secondary | ICD-10-CM

## 2022-04-25 ENCOUNTER — Telehealth (INDEPENDENT_AMBULATORY_CARE_PROVIDER_SITE_OTHER): Payer: Self-pay | Admitting: Nurse Practitioner

## 2022-04-25 NOTE — Telephone Encounter (Signed)
Referral has been pended for your approval.

## 2022-04-25 NOTE — Telephone Encounter (Signed)
After 4 weeks of exercise, elevation, and compression patient lymphedema and hyperpigmentation has not improved. Recommendation of pump.   Patient recorded Measurements: Left ankle  19.4cm (11/2);  19.6cm(12/6) Right ankle  23.6cm(11/2)  23.8cm(12/6) Left calf      31.5cm(11/2);  35.9cm(12/6) Right calf         34.1cm(11/2);  34.3cm(12/6)

## 2022-04-28 DIAGNOSIS — R2689 Other abnormalities of gait and mobility: Secondary | ICD-10-CM | POA: Diagnosis not present

## 2022-04-28 DIAGNOSIS — M6281 Muscle weakness (generalized): Secondary | ICD-10-CM | POA: Diagnosis not present

## 2022-04-28 DIAGNOSIS — G8929 Other chronic pain: Secondary | ICD-10-CM | POA: Diagnosis not present

## 2022-04-28 DIAGNOSIS — R2681 Unsteadiness on feet: Secondary | ICD-10-CM | POA: Diagnosis not present

## 2022-04-28 DIAGNOSIS — M545 Low back pain, unspecified: Secondary | ICD-10-CM | POA: Diagnosis not present

## 2022-05-01 ENCOUNTER — Emergency Department (HOSPITAL_COMMUNITY): Payer: Medicare Other

## 2022-05-01 ENCOUNTER — Emergency Department (HOSPITAL_COMMUNITY): Payer: Medicare Other | Admitting: Certified Registered"

## 2022-05-01 ENCOUNTER — Inpatient Hospital Stay (HOSPITAL_COMMUNITY)
Admission: EM | Admit: 2022-05-01 | Discharge: 2022-05-05 | DRG: 024 | Disposition: A | Discharge status: Rehabilitation Facility | Payer: Medicare Other | Attending: Neurology | Admitting: Neurology

## 2022-05-01 ENCOUNTER — Encounter (HOSPITAL_COMMUNITY)
Admission: EM | Disposition: A | Discharge status: Rehabilitation Facility | Payer: Self-pay | Source: Home / Self Care | Attending: Neurology

## 2022-05-01 DIAGNOSIS — R1310 Dysphagia, unspecified: Secondary | ICD-10-CM | POA: Diagnosis present

## 2022-05-01 DIAGNOSIS — I10 Essential (primary) hypertension: Secondary | ICD-10-CM | POA: Diagnosis present

## 2022-05-01 DIAGNOSIS — Z85828 Personal history of other malignant neoplasm of skin: Secondary | ICD-10-CM

## 2022-05-01 DIAGNOSIS — I63531 Cerebral infarction due to unspecified occlusion or stenosis of right posterior cerebral artery: Secondary | ICD-10-CM

## 2022-05-01 DIAGNOSIS — G4733 Obstructive sleep apnea (adult) (pediatric): Secondary | ICD-10-CM | POA: Diagnosis present

## 2022-05-01 DIAGNOSIS — Z9049 Acquired absence of other specified parts of digestive tract: Secondary | ICD-10-CM | POA: Diagnosis not present

## 2022-05-01 DIAGNOSIS — G8194 Hemiplegia, unspecified affecting left nondominant side: Secondary | ICD-10-CM | POA: Diagnosis present

## 2022-05-01 DIAGNOSIS — Z923 Personal history of irradiation: Secondary | ICD-10-CM

## 2022-05-01 DIAGNOSIS — R29810 Facial weakness: Secondary | ICD-10-CM | POA: Diagnosis present

## 2022-05-01 DIAGNOSIS — B379 Candidiasis, unspecified: Secondary | ICD-10-CM | POA: Diagnosis not present

## 2022-05-01 DIAGNOSIS — I4891 Unspecified atrial fibrillation: Secondary | ICD-10-CM | POA: Diagnosis present

## 2022-05-01 DIAGNOSIS — I63511 Cerebral infarction due to unspecified occlusion or stenosis of right middle cerebral artery: Secondary | ICD-10-CM | POA: Diagnosis not present

## 2022-05-01 DIAGNOSIS — Z66 Do not resuscitate: Secondary | ICD-10-CM | POA: Diagnosis present

## 2022-05-01 DIAGNOSIS — I69354 Hemiplegia and hemiparesis following cerebral infarction affecting left non-dominant side: Secondary | ICD-10-CM | POA: Diagnosis not present

## 2022-05-01 DIAGNOSIS — R471 Dysarthria and anarthria: Secondary | ICD-10-CM | POA: Diagnosis present

## 2022-05-01 DIAGNOSIS — I2510 Atherosclerotic heart disease of native coronary artery without angina pectoris: Secondary | ICD-10-CM

## 2022-05-01 DIAGNOSIS — I639 Cerebral infarction, unspecified: Secondary | ICD-10-CM | POA: Diagnosis present

## 2022-05-01 DIAGNOSIS — R29719 NIHSS score 19: Secondary | ICD-10-CM | POA: Diagnosis present

## 2022-05-01 DIAGNOSIS — Z87891 Personal history of nicotine dependence: Secondary | ICD-10-CM

## 2022-05-01 DIAGNOSIS — E876 Hypokalemia: Secondary | ICD-10-CM | POA: Diagnosis not present

## 2022-05-01 DIAGNOSIS — Z8249 Family history of ischemic heart disease and other diseases of the circulatory system: Secondary | ICD-10-CM

## 2022-05-01 DIAGNOSIS — Z79899 Other long term (current) drug therapy: Secondary | ICD-10-CM

## 2022-05-01 DIAGNOSIS — Z950 Presence of cardiac pacemaker: Secondary | ICD-10-CM | POA: Diagnosis not present

## 2022-05-01 DIAGNOSIS — Z9861 Coronary angioplasty status: Secondary | ICD-10-CM | POA: Diagnosis not present

## 2022-05-01 DIAGNOSIS — Z8673 Personal history of transient ischemic attack (TIA), and cerebral infarction without residual deficits: Secondary | ICD-10-CM

## 2022-05-01 DIAGNOSIS — I6601 Occlusion and stenosis of right middle cerebral artery: Secondary | ICD-10-CM | POA: Diagnosis not present

## 2022-05-01 DIAGNOSIS — R0689 Other abnormalities of breathing: Secondary | ICD-10-CM | POA: Diagnosis not present

## 2022-05-01 DIAGNOSIS — Z833 Family history of diabetes mellitus: Secondary | ICD-10-CM | POA: Diagnosis not present

## 2022-05-01 DIAGNOSIS — R4781 Slurred speech: Secondary | ICD-10-CM | POA: Diagnosis present

## 2022-05-01 DIAGNOSIS — I482 Chronic atrial fibrillation, unspecified: Secondary | ICD-10-CM | POA: Diagnosis not present

## 2022-05-01 DIAGNOSIS — E119 Type 2 diabetes mellitus without complications: Secondary | ICD-10-CM | POA: Diagnosis present

## 2022-05-01 DIAGNOSIS — H518 Other specified disorders of binocular movement: Secondary | ICD-10-CM | POA: Diagnosis present

## 2022-05-01 DIAGNOSIS — R0902 Hypoxemia: Secondary | ICD-10-CM | POA: Diagnosis not present

## 2022-05-01 DIAGNOSIS — I69322 Dysarthria following cerebral infarction: Secondary | ICD-10-CM | POA: Diagnosis not present

## 2022-05-01 DIAGNOSIS — I6523 Occlusion and stenosis of bilateral carotid arteries: Secondary | ICD-10-CM | POA: Diagnosis not present

## 2022-05-01 DIAGNOSIS — I69391 Dysphagia following cerebral infarction: Secondary | ICD-10-CM | POA: Diagnosis not present

## 2022-05-01 DIAGNOSIS — E785 Hyperlipidemia, unspecified: Secondary | ICD-10-CM | POA: Diagnosis present

## 2022-05-01 DIAGNOSIS — H409 Unspecified glaucoma: Secondary | ICD-10-CM | POA: Diagnosis present

## 2022-05-01 DIAGNOSIS — B9620 Unspecified Escherichia coli [E. coli] as the cause of diseases classified elsewhere: Secondary | ICD-10-CM | POA: Diagnosis not present

## 2022-05-01 DIAGNOSIS — E440 Moderate protein-calorie malnutrition: Secondary | ICD-10-CM | POA: Diagnosis not present

## 2022-05-01 DIAGNOSIS — Z888 Allergy status to other drugs, medicaments and biological substances status: Secondary | ICD-10-CM

## 2022-05-01 DIAGNOSIS — Z841 Family history of disorders of kidney and ureter: Secondary | ICD-10-CM

## 2022-05-01 DIAGNOSIS — I6611 Occlusion and stenosis of right anterior cerebral artery: Secondary | ICD-10-CM | POA: Diagnosis not present

## 2022-05-01 DIAGNOSIS — I6621 Occlusion and stenosis of right posterior cerebral artery: Secondary | ICD-10-CM | POA: Diagnosis not present

## 2022-05-01 DIAGNOSIS — E1169 Type 2 diabetes mellitus with other specified complication: Secondary | ICD-10-CM | POA: Diagnosis not present

## 2022-05-01 DIAGNOSIS — I495 Sick sinus syndrome: Secondary | ICD-10-CM | POA: Diagnosis not present

## 2022-05-01 DIAGNOSIS — M79604 Pain in right leg: Secondary | ICD-10-CM | POA: Diagnosis not present

## 2022-05-01 DIAGNOSIS — Z8542 Personal history of malignant neoplasm of other parts of uterus: Secondary | ICD-10-CM | POA: Diagnosis not present

## 2022-05-01 DIAGNOSIS — B370 Candidal stomatitis: Secondary | ICD-10-CM | POA: Diagnosis not present

## 2022-05-01 DIAGNOSIS — I69319 Unspecified symptoms and signs involving cognitive functions following cerebral infarction: Secondary | ICD-10-CM | POA: Diagnosis not present

## 2022-05-01 DIAGNOSIS — G319 Degenerative disease of nervous system, unspecified: Secondary | ICD-10-CM | POA: Diagnosis not present

## 2022-05-01 DIAGNOSIS — K219 Gastro-esophageal reflux disease without esophagitis: Secondary | ICD-10-CM | POA: Diagnosis not present

## 2022-05-01 DIAGNOSIS — G936 Cerebral edema: Secondary | ICD-10-CM | POA: Diagnosis not present

## 2022-05-01 DIAGNOSIS — I63411 Cerebral infarction due to embolism of right middle cerebral artery: Secondary | ICD-10-CM | POA: Diagnosis not present

## 2022-05-01 DIAGNOSIS — N390 Urinary tract infection, site not specified: Secondary | ICD-10-CM | POA: Diagnosis not present

## 2022-05-01 DIAGNOSIS — H539 Unspecified visual disturbance: Secondary | ICD-10-CM | POA: Diagnosis not present

## 2022-05-01 DIAGNOSIS — R414 Neurologic neglect syndrome: Secondary | ICD-10-CM | POA: Diagnosis present

## 2022-05-01 DIAGNOSIS — M545 Low back pain, unspecified: Secondary | ICD-10-CM | POA: Diagnosis present

## 2022-05-01 DIAGNOSIS — I69398 Other sequelae of cerebral infarction: Secondary | ICD-10-CM | POA: Diagnosis not present

## 2022-05-01 DIAGNOSIS — I6389 Other cerebral infarction: Secondary | ICD-10-CM | POA: Diagnosis not present

## 2022-05-01 DIAGNOSIS — M79605 Pain in left leg: Secondary | ICD-10-CM | POA: Diagnosis not present

## 2022-05-01 DIAGNOSIS — F32A Depression, unspecified: Secondary | ICD-10-CM | POA: Diagnosis not present

## 2022-05-01 DIAGNOSIS — E1142 Type 2 diabetes mellitus with diabetic polyneuropathy: Secondary | ICD-10-CM | POA: Diagnosis not present

## 2022-05-01 HISTORY — PX: RADIOLOGY WITH ANESTHESIA: SHX6223

## 2022-05-01 HISTORY — PX: IR CT HEAD LTD: IMG2386

## 2022-05-01 HISTORY — PX: IR US GUIDE VASC ACCESS RIGHT: IMG2390

## 2022-05-01 HISTORY — PX: IR PERCUTANEOUS ART THROMBECTOMY/INFUSION INTRACRANIAL INC DIAG ANGIO: IMG6087

## 2022-05-01 LAB — DIFFERENTIAL

## 2022-05-01 LAB — COMPREHENSIVE METABOLIC PANEL
ALT: 18 U/L (ref 0–44)
AST: 25 U/L (ref 15–41)
Albumin: 3.3 g/dL — ABNORMAL LOW (ref 3.5–5.0)
Alkaline Phosphatase: 47 U/L (ref 38–126)
Anion gap: 12 (ref 5–15)
BUN: 12 mg/dL (ref 8–23)
CO2: 25 mmol/L (ref 22–32)
Calcium: 8.3 mg/dL — ABNORMAL LOW (ref 8.9–10.3)
Chloride: 101 mmol/L (ref 98–111)
Creatinine, Ser: 0.82 mg/dL (ref 0.44–1.00)
GFR, Estimated: >60 mL/min (ref >60)
Glucose, Bld: 198 mg/dL — ABNORMAL HIGH (ref 70–99)
Potassium: 2.7 mmol/L — CL (ref 3.5–5.1)
Sodium: 138 mmol/L (ref 135–145)
Total Bilirubin: 1.3 mg/dL — ABNORMAL HIGH (ref 0.3–1.2)
Total Protein: 6.1 g/dL — ABNORMAL LOW (ref 6.5–8.1)

## 2022-05-01 LAB — CBC

## 2022-05-01 LAB — GLUCOSE, CAPILLARY
Glucose-Capillary: 194 mg/dL — ABNORMAL HIGH (ref 70–99)
Glucose-Capillary: 225 mg/dL — ABNORMAL HIGH (ref 70–99)

## 2022-05-01 LAB — CBG MONITORING, ED: Glucose-Capillary: 176 mg/dL — ABNORMAL HIGH (ref 70–99)

## 2022-05-01 LAB — PROTIME-INR

## 2022-05-01 LAB — ETHANOL: Alcohol, Ethyl (B): <10 mg/dL (ref <10)

## 2022-05-01 SURGERY — IR WITH ANESTHESIA
Anesthesia: General

## 2022-05-01 MED ORDER — POTASSIUM CHLORIDE 10 MEQ/100ML IV SOLN
10.0000 meq | INTRAVENOUS | Status: AC
  Administered 2022-05-01 (×4): 10 meq via INTRAVENOUS
  Filled 2022-05-01 (×4): qty 100

## 2022-05-01 MED ORDER — IOHEXOL 350 MG/ML SOLN
75.0000 mL | Freq: Once | INTRAVENOUS | Status: AC | PRN
  Administered 2022-05-01: 75 mL via INTRAVENOUS

## 2022-05-01 MED ORDER — SENNOSIDES-DOCUSATE SODIUM 8.6-50 MG PO TABS: 1.0000 | ORAL_TABLET | Freq: Every evening | ORAL | Status: DC | PRN

## 2022-05-01 MED ORDER — STROKE: EARLY STAGES OF RECOVERY BOOK: Freq: Once | Status: AC

## 2022-05-01 MED ORDER — IOHEXOL 300 MG/ML  SOLN
50.0000 mL | Freq: Once | INTRAMUSCULAR | Status: AC | PRN
  Administered 2022-05-01: 15 mL via INTRA_ARTERIAL

## 2022-05-01 MED ORDER — CHLORHEXIDINE GLUCONATE CLOTH 2 % EX PADS
6.0000 | MEDICATED_PAD | Freq: Every day | CUTANEOUS | Status: DC
  Administered 2022-05-01 – 2022-05-04 (×4): 6 via TOPICAL

## 2022-05-01 MED ORDER — IOHEXOL 300 MG/ML  SOLN
150.0000 mL | Freq: Once | INTRAMUSCULAR | Status: AC | PRN
  Administered 2022-05-01: 75 mL via INTRA_ARTERIAL

## 2022-05-01 MED ORDER — SUGAMMADEX SODIUM 200 MG/2ML IV SOLN
INTRAVENOUS | Status: DC | PRN
  Administered 2022-05-01: 200 mg via INTRAVENOUS

## 2022-05-01 MED ORDER — ONDANSETRON HCL 4 MG/2ML IJ SOLN
INTRAMUSCULAR | Status: DC | PRN
  Administered 2022-05-01: 4 mg via INTRAVENOUS

## 2022-05-01 MED ORDER — TENECTEPLASE FOR STROKE
0.2500 mg/kg | PACK | Freq: Once | INTRAVENOUS | Status: AC
  Administered 2022-05-01: 15 mg via INTRAVENOUS
  Filled 2022-05-01: qty 10

## 2022-05-01 MED ORDER — ACETAMINOPHEN 160 MG/5ML PO SOLN: 650.0000 mg | ORAL | Status: DC | PRN

## 2022-05-01 MED ORDER — ACETAMINOPHEN 650 MG RE SUPP: 650.0000 mg | RECTAL | Status: DC | PRN

## 2022-05-01 MED ORDER — SODIUM CHLORIDE 0.9 % IV SOLN: INTRAVENOUS | Status: DC | PRN

## 2022-05-01 MED ORDER — CLEVIDIPINE BUTYRATE 0.5 MG/ML IV EMUL
INTRAVENOUS | Status: AC
  Filled 2022-05-01: qty 50

## 2022-05-01 MED ORDER — LIDOCAINE HCL (CARDIAC) PF 100 MG/5ML IV SOSY
PREFILLED_SYRINGE | INTRAVENOUS | Status: DC | PRN
  Administered 2022-05-01: 60 mg via INTRAVENOUS

## 2022-05-01 MED ORDER — ACETAMINOPHEN 325 MG PO TABS
650.0000 mg | ORAL_TABLET | ORAL | Status: DC | PRN
  Administered 2022-05-03 – 2022-05-05 (×4): 650 mg via ORAL
  Filled 2022-05-01 (×4): qty 2

## 2022-05-01 MED ORDER — INSULIN ASPART 100 UNIT/ML IJ SOLN
0.0000 [IU] | INTRAMUSCULAR | Status: DC
  Administered 2022-05-01: 3 [IU] via SUBCUTANEOUS
  Administered 2022-05-02: 2 [IU] via SUBCUTANEOUS
  Administered 2022-05-02: 1 [IU] via SUBCUTANEOUS
  Administered 2022-05-02 – 2022-05-03 (×6): 2 [IU] via SUBCUTANEOUS
  Administered 2022-05-03 (×2): 1 [IU] via SUBCUTANEOUS

## 2022-05-01 MED ORDER — ORAL CARE MOUTH RINSE: 15.0000 mL | OROMUCOSAL | Status: DC | PRN

## 2022-05-01 MED ORDER — SODIUM CHLORIDE 0.9 % IV SOLN: INTRAVENOUS | Status: DC

## 2022-05-01 MED ORDER — DEXAMETHASONE SODIUM PHOSPHATE 10 MG/ML IJ SOLN
INTRAMUSCULAR | Status: DC | PRN
  Administered 2022-05-01: 10 mg via INTRAVENOUS

## 2022-05-01 MED ORDER — VERAPAMIL HCL 2.5 MG/ML IV SOLN
INTRAVENOUS | Status: AC
  Filled 2022-05-01: qty 2

## 2022-05-01 MED ORDER — ROCURONIUM BROMIDE 100 MG/10ML IV SOLN
INTRAVENOUS | Status: DC | PRN
  Administered 2022-05-01: 50 mg via INTRAVENOUS

## 2022-05-01 MED ORDER — CLEVIDIPINE BUTYRATE 0.5 MG/ML IV EMUL
0.0000 mg/h | INTRAVENOUS | Status: DC
  Administered 2022-05-01: 2 mg/h via INTRAVENOUS
  Administered 2022-05-02: 1 mg/h via INTRAVENOUS
  Filled 2022-05-01 (×2): qty 50

## 2022-05-01 MED ORDER — LABETALOL HCL 5 MG/ML IV SOLN
20.0000 mg | Freq: Once | INTRAVENOUS | Status: AC
  Administered 2022-05-01: 10 mg via INTRAVENOUS

## 2022-05-01 MED ORDER — SUCCINYLCHOLINE CHLORIDE 200 MG/10ML IV SOSY
PREFILLED_SYRINGE | INTRAVENOUS | Status: DC | PRN
  Administered 2022-05-01: 100 mg via INTRAVENOUS

## 2022-05-01 MED ORDER — CLEVIDIPINE BUTYRATE 0.5 MG/ML IV EMUL
0.0000 mg/h | INTRAVENOUS | Status: DC
  Administered 2022-05-01: 2 mg/h via INTRAVENOUS

## 2022-05-01 MED ORDER — ACETAMINOPHEN 325 MG PO TABS: 650.0000 mg | ORAL_TABLET | ORAL | Status: DC | PRN

## 2022-05-01 MED ORDER — PANTOPRAZOLE SODIUM 40 MG IV SOLR
40.0000 mg | INTRAVENOUS | Status: DC
  Administered 2022-05-02 – 2022-05-05 (×4): 40 mg via INTRAVENOUS
  Filled 2022-05-01 (×4): qty 10

## 2022-05-01 MED ORDER — PROPOFOL 10 MG/ML IV BOLUS
INTRAVENOUS | Status: DC | PRN
  Administered 2022-05-01: 110 mg via INTRAVENOUS

## 2022-05-01 NOTE — Transfer of Care (Signed)
Immediate Anesthesia Transfer of Care Note  Patient: Kara [name]  Procedure(s) Performed: IR WITH ANESTHESIA  Patient Location: ICU  Anesthesia Type:General  Level of Consciousness: responds to stimulation  Airway & Oxygen Therapy: Patient Spontanous Breathing and Patient connected to nasal cannula oxygen  Post-op Assessment: Report given to RN and Post -op Vital signs reviewed and stable  Post vital signs: Reviewed and stable  Last Vitals:  Vitals Value Taken Time  BP 123/60 05/01/22 1630  Temp    Pulse 80 05/01/22 1638  Resp 17 05/01/22 1638  SpO2 90 % 05/01/22 1638  Vitals shown include unvalidated device data.  Last Pain: There were no vitals filed for this visit.       Complications: No notable events documented.

## 2022-05-01 NOTE — Consult Note (Addendum)
NEURO HOSPITALIST CONSULT NOTE   Requestig physician: Dr. Regenia Skeeter  Reason for Consult: Acute onset of left sided weakness  History obtained from:  EMS, Daughter and Chart     HPI:                                                                                                                                          Kara [name] is an 88 y.o. female with an extensive PMHx as listed below, including atrial fibrillation, CAD, DM, glaucoma, pacemaker, HLD, HTN, anemia and OSA who presents from her assisted living facility via EMS as a Code Stroke with acute onset of left facial droop, left hemiplegia, left hemineglect, right gaze deviation and dysarthria. LKN 1320. Vitals per EMS: CBG 192, HR 104, BP 200s/100s, 94% [place].   She has a history of a fall 2 years ago during which she hit her head; imaging at that time had revealed a small amount of intracerebral hemorrhage.  At baseline she walks fairly well with a walker. Gets some assistance with some ADLs requiring exertion such as folding sheets. Mentally is very sharp for her age per family.   mRS: 1-2  Past Medical History:  Diagnosis Date   Allergy    Atrial fibrillation (HCC)    CAD (coronary artery disease)    Cancer (HCC)    UTERINE   Chronic anticoagulation    Chronic diarrhea    Closed compression fracture of L2 lumbar vertebra, initial encounter ([place]) 11/08/2020   By CT scan done to evaluate severe persistent low back pain:  Impression below also notes spinal compression from bulging disk.     Superior endplate compression fracture of L2 with approximately 25% height loss and minimal, 2-3 mm retropulsion of the superior endplate.   Left-sided, nondisplaced zone 1 sacral insufficiency fracture.   Multilevel degenerative changes of the lumbar spine resulting    Closed intertrochanteric fracture of left femur ([place]) 08/22/2020   Decubitus ulcer    sacral region   Diabetes ([place])    diet controlled   Dysuria    E.  coli UTI 11/21/2014   Edema of lower extremity    mainly right foot, slightly in left foot.   Facial fracture due to fall ([place]) 07/13/2020   Femur fracture, left ([place]) 06/09/2019   Fibrocystic breast disease    GERD (gastroesophageal reflux disease)    Glaucoma    Glaucoma    Hematuria    Hemorrhoids    History of colon polyps    History of pancreatitis    Hospital discharge follow-up 07/22/2020   Hyperlipidemia    Hypertension    Hypokalemia    IBS (irritable bowel syndrome)    Microscopic hematuria    Mitral valve regurgitation    Orbital fracture (  [place]) 07/13/2020   Osteoarthritis    fingers   Pernicious anemia    Plantar fasciitis    Recurrent UTI    Skin cancer    Sleep apnea, obstructive    Umbilical hernia without obstruction and without gangrene 06/26/2015   Vaginal atrophy    Vitamin D deficiency    Yeast vaginitis     Past Surgical History:  Procedure Laterality Date   ABDOMINAL HYSTERECTOMY  1980's   ABDOMINAL SURGERY     for villous polyp,,,many years ago   APPENDECTOMY  1940's   ASCAD, s/p PTCA  11/28/2005   MID LESION    BREAST BIOPSY Left 1970's   CARPAL TUNNEL RELEASE Right 12/13/2015   Procedure: CARPAL TUNNEL RELEASE;  Surgeon: Thornton Park, MD;  Location: [facility] OR[facility];  Service: Orthopedics;  Laterality: Right;   COLECTOMY  2015   INTRAMEDULLARY (IM) NAIL INTERTROCHANTERIC Left 06/11/2019   Procedure: INTRAMEDULLARY (IM) NAIL INTERTROCHANTRIC;  Surgeon: Earnestine Leys, MD;  Location: [facility] OR[facility];  Service: Orthopedics;  Laterality: Left;   KYPHOPLASTY N/A 11/15/2020   Procedure: KYPHOPLASTY, L2;  Surgeon: Hessie Knows, MD;  Location: [facility] OR[facility];  Service: Orthopedics;  Laterality: N/A;   PACEMAKER PLACEMENT     radation     for uterine cance   REFRACTIVE SURGERY     for bilateral glaumoma   SACROPLASTY N/A 11/15/2020   Procedure: SACROPLASTY;  Surgeon: Hessie Knows, MD;  Location: [facility] OR[facility];  Service: Orthopedics;  Laterality: N/A;   TONSILLECTOMY  1936     Family History  Problem Relation Age of Onset   Coronary artery disease Father    Kidney disease Father    Cancer Sister    Diabetes Other    Cancer Mother        COLON   Bladder Cancer Neg Hx    Breast cancer Neg Hx              Social History:  reports that she has quit smoking. She has never used smokeless tobacco. She reports current alcohol use. She reports that she does not use drugs.  Allergies  Allergen Reactions   Baycol [Cerivastatin Sodium]    Cardizem [Diltiazem Hcl]     LOWER EXTREMITY EDEMA   Crestor [Rosuvastatin Calcium] Other (See Comments)    INTOLERANT   Dronedarone Other (See Comments)    Fatigue   Metoprolol Tartrate Other (See Comments)   Omeprazole Other (See Comments)   Pravachol     INTOLERANT   Statins Other (See Comments)   Vioxx [Rofecoxib]    Zocor [Simvastatin]     INTOLERANT    HOME MEDICATIONS:                                                                                                                      No current facility-administered medications on file prior to encounter.   Current Outpatient Medications on File Prior to Encounter  Medication Sig Dispense Refill   acetaminophen (TYLENOL) 500 MG tablet Take 2 tablets (  1,000 mg total) by mouth every 12 (twelve) hours. 60 tablet 11   Cholecalciferol (VITAMIN D) 50 MCG (2000 UT) CAPS Take 1 capsule by mouth daily at 2 am.     Cranberry 250 MG TABS Take 2 tablets by mouth daily at 2 am.     cyanocobalamin (,VITAMIN B-12,) 1000 MCG/ML injection INJECT 1ML INTO THE MUSCLE EVERY 14 DAYS (Patient taking differently: Inject 1,000 mcg into the muscle See admin instructions. INJECT 1ML INTO THE MUSCLE EVERY 14 DAYS) 10 mL 0   diphenoxylate-atropine (LOMOTIL) 2.5-0.025 MG tablet TAKE 1 TABLET BY MOUTH THREE TIMES DAILYAS NEEDED FOR DIARRHEA (Patient taking differently: Take 1 tablet by mouth 3 (three) times daily as needed for diarrhea or loose stools.) 90 tablet 2   estradiol (ESTRACE)  0.1 MG/GM vaginal cream Place a dab of cream around the urethra nightly for 2 weeks,  then twice weekly thereafter 42.5 g 1   Infant Care Products (DERMACLOUD) OINT Apply 1 application topically as needed. 430 g 1   Lidocaine (SALONPAS PAIN RELIEVING) 4 % PTCH Apply 1 patch daily to your back and upper arm. 90 patch 1   metoprolol succinate (TOPROL-XL) 25 MG 24 hr tablet TAKE 1 TABLET BY MOUTH IN THE MORNING AND AT BEDTIME (Patient taking differently: Take 25 mg by mouth 2 (two) times daily at 8 am and 10 pm.) 180 tablet 0   traMADol (ULTRAM) 50 MG tablet Take 1 tablet (50 mg total) by mouth every 12 (twelve) hours as needed for moderate pain. 60 tablet 2   triamcinolone cream (KENALOG) 0.1 % APPLY 1 APPLICATION TOPICALLY 2 TIMES DAILY TO VULVA UNTIL ITCHING RESOLVES THEN REDUCE USE TO TWICE WEEKLY. (Patient taking differently: Apply 1 Application topically See admin instructions. APPLY 1 APPLICATION TOPICALLY 2 TIMES DAILY TO VULVA UNTIL ITCHING RESOLVES THEN REDUCE USE TO TWICE WEEKLY.) 80 g 1   diclofenac Sodium (VOLTAREN) 1 % GEL Apply 2 g topically 4 (four) times daily. To painful joints as needed (Patient not taking: Reported on 05/01/2022) 100 g 5   ondansetron (ZOFRAN) 4 MG tablet Take 1 tablet (4 mg total) by mouth every 6 (six) hours as needed for nausea. (Patient not taking: Reported on 05/01/2022) 20 tablet 0     ROS:                                                                                                                                       As per HPI. Unable to obtain a detailed ROS due to bradyphrenia and disorientation.   BP (!) 176/90   Ht '5\' 1"'$  (1.549 m)   Wt 60.4 kg   BMI 25.16 kg/m  Height '5\' 1"'$  (1.549 m), weight 60.4 kg.   General Examination:  Physical Exam  HEENT-  [place]/AT    Lungs- Respirations unlabored Extremities- Warm and well perfused  Neurological  Examination Mental Status: Awake and alert with left hemineglect and anosognosia. Speech sparse but fluent within this context. Able to name several objects correctly. Oriented to month, day, "[place]", [place] but not the year. Significant dysarthria.  Cranial Nerves: II: Blinks to threat in right hemifield but not left hemifield. PERRL.   III,IV, VI: No ptosis. Right gaze deviation - unable to cross midline to the left.  V: Decreased left facial sensation VII: Left facial droop. VIII: Hearing intact to loud voice IX,X: Gag reflex deferred XI: Head rotated far to the right XII: Does not protrude tongue Motor: RUE and RLE 4+/5 LUE and LLE 0/5 Sensory: Severely decreased sensation on the left.  Deep Tendon Reflexes: 1+ BUE and BLE. Right toe downgoing, left toe upgoing.  Cerebellar: No ataxia on the right. Unable to assess on the left due to hemiplegia.  Gait: Unable to assess  NIHSS: 19   Lab Results: Basic Metabolic Panel: No results for input(s): "NA", "K", "CL", "CO2", "GLUCOSE", "BUN", "CREATININE", "CALCIUM", "MG", "PHOS" in the last 168 hours.  CBC: No results for input(s): "WBC", "NEUTROABS", "HGB", "HCT", "MCV", "PLT" in the last 168 hours.  Cardiac Enzymes: No results for input(s): "CKTOTAL", "CKMB", "CKMBINDEX", "TROPONINI" in the last 168 hours.  Lipid Panel: No results for input(s): "CHOL", "TRIG", "HDL", "CHOLHDL", "VLDL", "LDLCALC" in the last 168 hours.  Imaging: No results found.   Assessment: 88 year old female presenting with acute onset of stroke symptoms referable to the right MCA territory.  - Exam reveals multiple deficits referable to a large region of infarcted or hypoperfused cortex in the right hemisphere. NIHSS 19.  - CT head: Age-indeterminate, possibly acute infarct in the right cerebellum. No hemorrhage. - CTA of head and neck: Acute occlusion at the right M1/M2 junction with nearly absent arborization in the right MCA territory. Thrombus and  acute occlusion of the distal A2 segment of the right ACA. Focal occlusion of the P2 segment of the right PCA. No hemodynamically significant stenosis in the neck.  - After comprehensive review of possible contraindications, she has no absolute contraindications to TNK administration. - The patient is a TNK candidate. Discussed extensively the risks/benefits of TNK treatment vs. no treatment with the patient's daughter, including risks of hemorrhage and death with TNK administration versus worse overall outcomes on average in patients within the IV thrombolytic time window who are not administered TNK. The patient's left hemineglect precludes meaningful medical decision making on her part at this time. Overall benefits of TNK regarding long-term prognosis are felt to outweigh risks. The patient's daughter expressed understanding and wish to proceed with TNK.  - The patient is a VIR candidate. Risks/benefits of the procedure were discussed extensively with patient, including approximately 50% chance of significant improvement relative to an approximate 10% chance of subarachnoid hemorrhage with possibility of significant worsening including death. Family understands the need for anesthesia requiring intubation for the procedure which necessitates that the patient be full code for the procedure. The patient's family expressed understanding and provided informed consent to proceed with VIR. All questions answered   Recommendations: 1. Admitting to Neuro ICU after VIR 2. Post-TNK and VIR order set to include frequent neuro checks and BP management.  3. No antiplatelet medications or anticoagulants for at least 24 hours following TNK.  4. DVT prophylaxis with SCDs.  5. Fasting lipid panel, HgbA1c 6. Will need to  start antiplatelet therapy if follow up CT at 24 hours is negative for hemorrhagic conversion. 7. No PPI, patient has allergy.  8. TTE.  9. MRI brain desired but may not be able to obtain due to  pacemaker.  10. PT/OT/Speech.  11. NPO until passes swallow evaluation.  12. Sliding scale insulin.  13. Telemetry monitoring  Addendum: - Order clarifying that the patient is DNR for all circumstances except procedures requiring intubation such as operations or catheter-based interventions has been placed. Discussed over the telephone with patient's daughter for clarification.   80 minutes spent in the emergent neurological evaluation and management of this critically ill patient with acute large hemispheric stroke presenting with high risk of acute decompensation and possible death with or without treatment.   Electronically signed: Dr. Kerney Elbe 05/01/2022, 2:17 PM

## 2022-05-01 NOTE — Anesthesia Procedure Notes (Signed)
Procedure Name: Intubation Date/Time: 05/01/2022 2:51 PM  Performed by: Babs Bertin, CRNAPre-anesthesia Checklist: Patient identified, Emergency Drugs available, Suction available and Patient being monitored Patient Re-evaluated:Patient Re-evaluated prior to induction Oxygen Delivery Method: Circle system utilized Preoxygenation: Pre-oxygenation with 100% oxygen Induction Type: IV induction and Rapid sequence Ventilation: Mask ventilation without difficulty Laryngoscope Size: Mac and 3 Grade View: Grade I Tube type: Oral Tube size: 7.0 mm Number of attempts: 1 Airway Equipment and Method: Stylet and Oral airway Placement Confirmation: ETT inserted through vocal cords under direct vision, positive ETCO2 and breath sounds checked- equal and bilateral Secured at: 21 cm Tube secured with: Tape Dental Injury: Teeth and Oropharynx as per pre-operative assessment

## 2022-05-01 NOTE — Progress Notes (Signed)
PHARMACIST CODE STROKE RESPONSE  Notified to mix TNK at 14:29 by Dr. Cheral Marker TNK preparation completed at 14:31  TNK dose = 15 mg IV over 5 seconds.   Issues/delays encountered (if applicable): BP was elevated at 195/96 at 14:27. Decision to lower BP was deferred until decision was made to proceed with TNK. Family provided consent to give TNK at 14:29, when Dr. Cheral Marker gave order to mix TNK. Labetalol '10mg'$  IV x1 was attempted to be given, however IV infiltrated. Labetalol '10mg'$  was given via different IV access at 14:29 and repeat BP was 176/95 at 14:35. TNK was administered promptly at 14:35. Clevidipine gtt was initiated to maintain BP <180/105 post-TNK administration.   Luisa Hart, PharmD, BCPS Clinical Pharmacist 05/01/2022 2:56 PM   Please refer to [facility] for pharmacy phone number

## 2022-05-01 NOTE — Anesthesia Postprocedure Evaluation (Deleted)
Anesthesia Post Note  Patient: Kara [name]  Procedure(s) Performed: IR WITH ANESTHESIA     Patient location during evaluation: ICU Anesthesia Type: General Level of consciousness: sedated Pain management: pain level controlled Vital Signs Assessment: post-procedure vital signs reviewed and stable Respiratory status: spontaneous breathing and respiratory function stable Cardiovascular status: stable Postop Assessment: no apparent nausea or vomiting Anesthetic complications: no   No notable events documented.  Last Vitals:  Vitals:   05/01/22 1435 05/01/22 1442  BP: (!) 175/95 (!) 176/90    Last Pain: There were no vitals filed for this visit.               Eiliana Drone DANIEL

## 2022-05-01 NOTE — Procedures (Signed)
INTERVENTIONAL NEURORADIOLOGY BRIEF POSTPROCEDURE NOTE  DIAGNOSTIC CEREBRAL ANGIOGRAM AND MECHANICAL THROMBECTOMY    Attending: Dr. Pedro Earls   Diagnosis: Right M1/MCA and P2/PCA occlusion.    Access site: Right common femoral artery.    Access closure: 8 [place] Angio-Seal    Anesthesia: General anesthesia.    Medication used: Refer to anesthesia documentation.   Complications: None.    Estimated blood loss: 100 mL.    Specimen: None.    Findings: Occlusion of the distal right M1/MCA and mid P2/PCA.  Mechanical thrombectomy performed with direct contact aspiration (x2) in the right MCA with complete recanalization (TICI 3).  Subsequently, mechanical thrombectomy was performed with direct contact aspiration (x2) in the fetal right PCA with complete recanalization (TICI 3).  No evidence of thromboembolic or hemorrhagic complication.    The patient was transferred to ICU in stable condition.    PLAN:  SBP 120-140 mmHg  Bed rest x6 hours

## 2022-05-01 NOTE — Anesthesia Preprocedure Evaluation (Signed)
Anesthesia Evaluation  Patient identified by MRN, date of birth, ID band  Reviewed: Allergy & Precautions, Patient's Chart, lab work & pertinent test results, Unable to perform ROS - Chart review onlyPreop documentation limited or incomplete due to emergent nature of procedure.  Airway        Dental   Pulmonary sleep apnea , former smoker          Cardiovascular hypertension, + CAD  + dysrhythmias Atrial Fibrillation + pacemaker + Valvular Problems/Murmurs MR   Echo 11/26/2021 INTERPRETATION  NORMAL LEFT VENTRICULAR SYSTOLIC FUNCTION  NORMAL RIGHT VENTRICULAR SYSTOLIC FUNCTION  DOPPLER ECHO and OTHER SPECIAL PROCEDURES                 Aortic: No AR                      No AS                         123.2 cm/sec peak vel      6.1 mmHg peak grad                         3.2 mmHg mean grad         1.7 cm^2 by DOPPLER                 Mitral: SEVERE MR                  No MS                         MV Inflow E Vel = 127.0 cm/sec      MV Annulus E'Vel = nm*                         E/E'Ratio = nm*              Tricuspid: SEVERE TR                  No TS                         281.9 cm/sec peak TR vel   36.8 mmHg peak RV pressure              Pulmonary: MILD PR                    No PS   SEVERE VALVULAR REGURGITATION (See above)  NO VALVULAR STENOSIS  __________________________________________     Neuro/Psych CVA    GI/Hepatic Neg liver ROS,GERD  ,,  Endo/Other  negative endocrine ROSdiabetes    Renal/GU negative Renal ROS     Musculoskeletal negative musculoskeletal ROS (+)    Abdominal   Peds  Hematology negative hematology ROS (+)   Anesthesia Other Findings   Reproductive/Obstetrics                             Anesthesia Physical Anesthesia Plan  ASA: 4 and emergent  Anesthesia Plan: General   Post-op Pain Management: Minimal or no pain anticipated   Induction: Intravenous  PONV Risk  Score and Plan: 3  Airway Management Planned: Oral ETT  Additional Equipment:   Intra-op Plan:   Post-operative Plan: Possible Post-op intubation/ventilation  Informed Consent: I  have reviewed the patients History and Physical, chart, labs and discussed the procedure including the risks, benefits and alternatives for the proposed anesthesia with the patient or authorized representative who has indicated his/her understanding and acceptance.     Consent reviewed with POA  Plan Discussed with: Anesthesiologist and CRNA  Anesthesia Plan Comments:         Anesthesia Quick Evaluation

## 2022-05-01 NOTE — Anesthesia Postprocedure Evaluation (Signed)
Anesthesia Post Note  Patient: Kara [name]  Procedure(s) Performed: IR WITH ANESTHESIA     Patient location during evaluation: ICU Anesthesia Type: General Level of consciousness: sedated Pain management: pain level controlled Vital Signs Assessment: post-procedure vital signs reviewed and stable Respiratory status: spontaneous breathing and respiratory function stable Cardiovascular status: stable Postop Assessment: no apparent nausea or vomiting Anesthetic complications: no   No notable events documented.  Last Vitals:  Vitals:   05/01/22 1645 05/01/22 1700  BP: (!) 110/58 122/62  Pulse: 80 79  Resp: 15 16  SpO2: 96% 98%    Last Pain: There were no vitals filed for this visit.               [name] DANIEL

## 2022-05-01 NOTE — ED Provider Notes (Signed)
[facility] EMERGENCY DEPARTMENT Provider Note   CSN: [id number] Arrival date & time: 05/01/22  1407  An emergency department physician performed an initial assessment on this suspected stroke patient at 1409.  History  No chief complaint on file.   Kara [name] is a 88 y.o. female.  HPI 88 year old female presents as a code stroke.  History is limited as the patient has slurred speech.  EMS provides most of the history and notes that she was normal and then had sudden change at around 1:20 PM.  She normally is alert and oriented x 4 and lives in independent living.  She was with a friend when she developed trouble speaking and left-sided weakness.  She is having left-sided neglect.  Home Medications Prior to Admission medications   Medication Sig Start Date End Date Taking? Authorizing Provider  acetaminophen (TYLENOL) 500 MG tablet Take 2 tablets (1,000 mg total) by mouth every 12 (twelve) hours. 11/18/21  Yes Crecencio Mc, MD  Cholecalciferol (VITAMIN D) 50 MCG (2000 UT) CAPS Take 1 capsule by mouth daily at 2 am.   Yes [provider]  Cranberry 250 MG TABS Take 2 tablets by mouth daily at 2 am.   Yes [provider]  cyanocobalamin (,VITAMIN B-12,) 1000 MCG/ML injection INJECT 1ML INTO THE MUSCLE EVERY 14 DAYS Patient taking differently: Inject 1,000 mcg into the muscle See admin instructions. INJECT 1ML INTO THE MUSCLE EVERY 14 DAYS 10/18/21  Yes Crecencio Mc, MD  diphenoxylate-atropine (LOMOTIL) 2.5-0.025 MG tablet TAKE 1 TABLET BY MOUTH THREE TIMES DAILYAS NEEDED FOR DIARRHEA Patient taking differently: Take 1 tablet by mouth 3 (three) times daily as needed for diarrhea or loose stools. 03/06/22  Yes Crecencio Mc, MD  estradiol (ESTRACE) 0.1 MG/GM vaginal cream Place a dab of cream around the urethra nightly for 2 weeks,  then twice weekly thereafter 08/14/21  Yes Crecencio Mc, MD  [facility]) OINT Apply 1  application topically as needed. 07/20/20  Yes Crecencio Mc, MD  Lidocaine (SALONPAS PAIN RELIEVING) 4 % PTCH Apply 1 patch daily to your back and upper arm. 08/20/21  Yes Crecencio Mc, MD  metoprolol succinate (TOPROL-XL) 25 MG 24 hr tablet TAKE 1 TABLET BY MOUTH IN THE MORNING AND AT BEDTIME Patient taking differently: Take 25 mg by mouth 2 (two) times daily at 8 am and 10 pm. 01/15/22  Yes Crecencio Mc, MD  traMADol (ULTRAM) 50 MG tablet Take 1 tablet (50 mg total) by mouth every 12 (twelve) hours as needed for moderate pain. 11/18/21  Yes Crecencio Mc, MD  triamcinolone cream (KENALOG) 0.1 % APPLY 1 APPLICATION TOPICALLY 2 TIMES DAILY TO VULVA UNTIL ITCHING RESOLVES THEN REDUCE USE TO TWICE WEEKLY. Patient taking differently: Apply 1 Application topically See admin instructions. APPLY 1 APPLICATION TOPICALLY 2 TIMES DAILY TO VULVA UNTIL ITCHING RESOLVES THEN REDUCE USE TO TWICE WEEKLY. 04/22/22  Yes Crecencio Mc, MD  diclofenac Sodium (VOLTAREN) 1 % GEL Apply 2 g topically 4 (four) times daily. To painful joints as needed Patient not taking: Reported on 05/01/2022 11/18/21   Crecencio Mc, MD  ondansetron (ZOFRAN) 4 MG tablet Take 1 tablet (4 mg total) by mouth every 6 (six) hours as needed for nausea. Patient not taking: Reported on 05/01/2022 11/16/20   Ezekiel Slocumb, DO      Allergies    Baycol [cerivastatin sodium], Cardizem [diltiazem hcl], Crestor [rosuvastatin calcium], Dronedarone, Metoprolol tartrate, Omeprazole,  Pravachol, Statins, Vioxx [rofecoxib], and Zocor [simvastatin]    Review of Systems   Review of Systems  Unable to perform ROS: Acuity of condition    Physical Exam Updated Vital Signs BP (!) 176/90   Ht '5\' 1"'$  (1.549 m)   Wt 60.4 kg   BMI 25.16 kg/m  Physical Exam Vitals and nursing note reviewed.  Constitutional:      Appearance: She is well-developed.  HENT:     Head: Normocephalic and atraumatic.  Cardiovascular:     Rate and Rhythm: Normal rate.  Rhythm irregular.     Heart sounds: Normal heart sounds.  Pulmonary:     Effort: Pulmonary effort is normal.     Breath sounds: Normal breath sounds.  Abdominal:     General: There is no distension.     Palpations: Abdomen is soft.  Skin:    General: Skin is warm and dry.  Neurological:     Mental Status: She is alert.     Comments: Patient is awake and alert with slurred speech but able to say correct words.  She is oriented.  She has good right-sided strength in the arm and leg, but is flaccid in the left arm and left leg.  She has left-sided neglect.     ED Results / Procedures / Treatments   Labs (all labs ordered are listed, but only abnormal results are displayed) Labs Reviewed  CBG MONITORING, ED - Abnormal; Notable for the following components:      Result Value   Glucose-Capillary 176 (*)    All other components within normal limits  PROTIME-INR  CBC  DIFFERENTIAL  ETHANOL  I-STAT CHEM 8, ED    EKG None  Radiology CT ANGIO HEAD NECK W WO CM (CODE STROKE)  Result Date: 05/01/2022 CLINICAL DATA:  Stroke follow-up EXAM: CT ANGIOGRAPHY HEAD AND NECK TECHNIQUE: Multidetector CT imaging of the head and neck was performed using the standard protocol during bolus administration of intravenous contrast. Multiplanar CT image reconstructions and MIPs were obtained to evaluate the vascular anatomy. Carotid stenosis measurements (when applicable) are obtained utilizing NASCET criteria, using the distal internal carotid diameter as the denominator. RADIATION DOSE REDUCTION: This exam was performed according to the departmental dose-optimization program which includes automated exposure control, adjustment of the mA and/or kV according to patient size and/or use of iterative reconstruction technique. CONTRAST:  43m OMNIPAQUE IOHEXOL 350 MG/ML SOLN COMPARISON:  None Available. FINDINGS: CT HEAD FINDINGS Please see same-day CT brain for intracranial findings. Review of the MIP images  confirms the above findings CTA NECK FINDINGS Aortic arch: Standard branching. Imaged portion shows no evidence of aneurysm or dissection. No significant stenosis of the major arch vessel origins. Right carotid system: No evidence of dissection, stenosis (50% or greater), or occlusion. Left carotid system: No evidence of dissection, stenosis (50% or greater), or occlusion. Vertebral arteries: Codominant. No evidence of dissection, stenosis (50% or greater), or occlusion. Skeleton: Negative. Other neck: Negative. Upper chest: Small left pleural effusion. Partially visualized cardiac device in place. Review of the MIP images confirms the above findings CTA HEAD FINDINGS Anterior circulation: There is thrombus of the right M1/M2 junction with associated occlusion and nearly absent arborization in the right MCA territory. There is thrombus in an acute occlusion in the distal A2 segment of the right ACA (series 7, image 58). Posterior circulation: There is a fetal PCA on the right with the P2 segment supplied by the right PCOM. The P2 segment of the right PCA  is focally occluded (series 7, image 104). Venous sinuses: As permitted by contrast timing, patent. Anatomic variants: Fetal PCA on the right. Review of the MIP images confirms the above findings IMPRESSION: 1. Acute occlusion at the right M1/M2 junction with nearly absent arborization in the right MCA territory. 2. Thrombus and acute occlusion of the distal A2 segment of the right ACA. 3. Focal occlusion of the P2 segment of the right PCA. 4. No hemodynamically significant stenosis in the neck. 5. Small bilateral pleural effusions. Findings were discussed with Dr. Cheral Marker on 05/01/22 at 2:44 PM. Electronically Signed   By: Marin Roberts M.D.   On: 05/01/2022 15:02   CT HEAD CODE STROKE WO CONTRAST  Result Date: 05/01/2022 CLINICAL DATA:  Code stroke.  No history provided. EXAM: CT HEAD WITHOUT CONTRAST TECHNIQUE: Contiguous axial images were obtained from the  base of the skull through the vertex without intravenous contrast. RADIATION DOSE REDUCTION: This exam was performed according to the departmental dose-optimization program which includes automated exposure control, adjustment of the mA and/or kV according to patient size and/or use of iterative reconstruction technique. COMPARISON:  CT Brain 06/09/19 FINDINGS: Brain: Age-indeterminate, possibly acute infarct in the right cerebellum. No hemorrhage. No hydrocephalus. No extra-axial fluid collection. Vascular: Dense atherosclerotic calcifications involving the bilateral V4 segments of the vertebral arteries. Skull: Normal. Negative for fracture or focal lesion. Sinuses/Orbits: No acute finding. Other: None. ASPECTS [facility] Stroke Program Early CT Score): 10 IMPRESSION: Age-indeterminate, possibly acute infarct in the right cerebellum. No hemorrhage. Findings were paged to Dr. Cheral Marker on 05/01/22 at 2:26 PM via [facility] paging system. Electronically Signed   By: Marin Roberts M.D.   On: 05/01/2022 14:27    Procedures .Critical Care  Performed by: Sherwood Gambler, MD Authorized by: Sherwood Gambler, MD   Critical care provider statement:    Critical care time (minutes):  35   Critical care time was exclusive of:  Separately billable procedures and treating other patients   Critical care was necessary to treat or prevent imminent or life-threatening deterioration of the following conditions:  CNS failure or compromise   Critical care was time spent personally by me on the following activities:  Development of treatment plan with patient or surrogate, discussions with consultants, evaluation of patient's response to treatment, examination of patient, ordering and review of laboratory studies, ordering and review of radiographic studies, ordering and performing treatments and interventions, pulse oximetry, re-evaluation of patient's condition and review of old charts     Medications Ordered in ED Medications   clevidipine (CLEVIPREX) 0.5 MG/ML infusion (has no administration in time range)  verapamil (ISOPTIN) 2.5 MG/ML injection (has no administration in time range)  labetalol (NORMODYNE) injection 20 mg (10 mg Intravenous Given 05/01/22 1429)    And  clevidipine (CLEVIPREX) infusion 0.5 mg/mL (0 mg/hr Intravenous Stopped 05/01/22 1445)  tenecteplase (TNKASE) injection for Stroke 15 mg (15 mg Intravenous Given 05/01/22 1435)  iohexol (OMNIPAQUE) 350 MG/ML injection 75 mL (75 mLs Intravenous Contrast Given 05/01/22 1436)  iohexol (OMNIPAQUE) 300 MG/ML solution 50 mL (15 mLs Intra-arterial Contrast Given 05/01/22 1547)  iohexol (OMNIPAQUE) 300 MG/ML solution 150 mL (75 mLs Intra-arterial Contrast Given 05/01/22 1543)    ED Course/ Medical Decision Making/ A&P                           Medical Decision Making Amount and/or Complexity of Data Reviewed Labs: ordered. Radiology: ordered and independent interpretation performed.  Details: No head bleed on head CT.  Acute right-sided MCA occlusion. ECG/medicine tests: ordered.  Risk Decision regarding hospitalization.   Patient presents as a code stroke.  She is protecting her airway.  She was hypertensive, around 195/90.  Decision made by neurology to give TNK, was given a small bolus of IV labetalol first.  Her heart rate is in the 80s-90s.  After blood pressure controlled, TNK was given.  She has not LVO on her CTA and will be taken emergently to IR.  Neurology has consented daughter for both TNK and IR.  Taken emergently to IR.        Final Clinical Impression(s) / ED Diagnoses Final diagnoses:  Acute ischemic stroke [facility])    Rx / DC Orders ED Discharge Orders     None         Sherwood Gambler, MD 05/01/22 1554

## 2022-05-01 NOTE — Code Documentation (Signed)
Kara [name] is a 88 yr old female arriving to [facility] via EMS on 05/01/2022 with a PMH of AF not on [facility] and CAD. Pt is from [facility]isted [facility] facility where she was last known well at 1320, having a conversation with someone, when she turned her head and eyes towards the right and became dysarthric and hemiplegic on the left.    Stroke team at bridge on pt's arrival. CBG obtained, EDP cleared airway. Pt then taken to CT with team. NIHSS 19. (Please see documentation for timeline and NIHSS details). Pt with rt gaze, Lt hemianopia, Lt hemiparesis and neglect, and dysarthria on exam.     CT non-contrast neg for acute hemorrhage per Dr Cheral Marker. BP 638V systolic. Held off lowering BP while Dr. Cheral Marker obtaining consent with pt's daughter for TNK (as 190s would be ok if NO thrombolytic). When daughter consented to TNK at 1429, labetelol given. At 1435 BP 175/95, TNK given per order. CTA performed. Per Dr Cheral Marker, pt has Rt M2 occlusion. Code IR activated. Blood drawn, pt prepped for IR. Consent for mechanical thrombectomy obtained by Dr. Cheral Marker from pt's daughter.     Pt taken to IR at 1443. Bedside handoff with IR RN.

## 2022-05-02 ENCOUNTER — Inpatient Hospital Stay (HOSPITAL_COMMUNITY): Payer: Medicare Other

## 2022-05-02 DIAGNOSIS — Z950 Presence of cardiac pacemaker: Secondary | ICD-10-CM | POA: Diagnosis not present

## 2022-05-02 DIAGNOSIS — I482 Chronic atrial fibrillation, unspecified: Secondary | ICD-10-CM | POA: Diagnosis not present

## 2022-05-02 DIAGNOSIS — I639 Cerebral infarction, unspecified: Secondary | ICD-10-CM | POA: Diagnosis not present

## 2022-05-02 DIAGNOSIS — I6389 Other cerebral infarction: Secondary | ICD-10-CM

## 2022-05-02 LAB — LIPID PANEL
Cholesterol: 122 mg/dL (ref 0–200)
HDL: 41 mg/dL (ref >40)
LDL Cholesterol: 58 mg/dL (ref 0–99)
Total CHOL/HDL Ratio: 3 RATIO
Triglycerides: 114 mg/dL (ref <150)
VLDL: 23 mg/dL (ref 0–40)

## 2022-05-02 LAB — GLUCOSE, CAPILLARY
Glucose-Capillary: 190 mg/dL — ABNORMAL HIGH (ref 70–99)
Glucose-Capillary: 156 mg/dL — ABNORMAL HIGH (ref 70–99)
Glucose-Capillary: 161 mg/dL — ABNORMAL HIGH (ref 70–99)
Glucose-Capillary: 168 mg/dL — ABNORMAL HIGH (ref 70–99)
Glucose-Capillary: 164 mg/dL — ABNORMAL HIGH (ref 70–99)
Glucose-Capillary: 123 mg/dL — ABNORMAL HIGH (ref 70–99)

## 2022-05-02 LAB — CBC
HCT: 37.6 % (ref 36.0–46.0)
Hemoglobin: 12.8 g/dL (ref 12.0–15.0)
MCH: 32.8 pg (ref 26.0–34.0)
MCHC: 34 g/dL (ref 30.0–36.0)
MCV: 96.4 fL (ref 80.0–100.0)
Platelets: 160 10*3/uL (ref 150–400)
RBC: 3.9 MIL/uL (ref 3.87–5.11)
RDW: 13.1 % (ref 11.5–15.5)
WBC: 11.9 10*3/uL — ABNORMAL HIGH (ref 4.0–10.5)
nRBC: 0 % (ref 0.0–0.2)

## 2022-05-02 LAB — BASIC METABOLIC PANEL
Anion gap: 13 (ref 5–15)
BUN: 12 mg/dL (ref 8–23)
CO2: 23 mmol/L (ref 22–32)
Calcium: 8.1 mg/dL — ABNORMAL LOW (ref 8.9–10.3)
Chloride: 105 mmol/L (ref 98–111)
Creatinine, Ser: 0.86 mg/dL (ref 0.44–1.00)
GFR, Estimated: >60 mL/min (ref >60)
Glucose, Bld: 163 mg/dL — ABNORMAL HIGH (ref 70–99)
Potassium: 3.6 mmol/L (ref 3.5–5.1)
Sodium: 141 mmol/L (ref 135–145)

## 2022-05-02 LAB — ECHOCARDIOGRAM COMPLETE
Area-P 1/2: 3.94 cm2
Height: 61 in
S' Lateral: 2.6 cm
Weight: 2130.53 oz

## 2022-05-02 MED ORDER — HEPARIN SODIUM (PORCINE) 5000 UNIT/ML IJ SOLN
5000.0000 [IU] | Freq: Three times a day (TID) | INTRAMUSCULAR | Status: DC
  Administered 2022-05-02 – 2022-05-05 (×8): 5000 [IU] via SUBCUTANEOUS
  Filled 2022-05-02 (×9): qty 1

## 2022-05-02 MED ORDER — METOPROLOL TARTRATE 25 MG PO TABS: 12.5000 mg | ORAL_TABLET | Freq: Two times a day (BID) | ORAL | Status: DC

## 2022-05-02 MED ORDER — TRAMADOL HCL 50 MG PO TABS
50.0000 mg | ORAL_TABLET | Freq: Two times a day (BID) | ORAL | Status: DC | PRN
  Administered 2022-05-04: 50 mg via ORAL
  Filled 2022-05-02: qty 1

## 2022-05-02 MED ORDER — METOPROLOL TARTRATE 12.5 MG HALF TABLET
12.5000 mg | ORAL_TABLET | Freq: Two times a day (BID) | ORAL | Status: DC
  Administered 2022-05-02 – 2022-05-05 (×6): 12.5 mg via ORAL
  Filled 2022-05-02 (×6): qty 1

## 2022-05-02 MED ORDER — LIDOCAINE 5 % EX PTCH
1.0000 | MEDICATED_PATCH | Freq: Every day | CUTANEOUS | Status: DC | PRN
  Administered 2022-05-03: 1 via TRANSDERMAL
  Filled 2022-05-02 (×2): qty 1

## 2022-05-02 MED ORDER — PERFLUTREN LIPID MICROSPHERE
1.0000 mL | INTRAVENOUS | Status: DC | PRN
  Administered 2022-05-02: 2 mL via INTRAVENOUS

## 2022-05-02 MED ORDER — ASPIRIN 325 MG PO TBEC
325.0000 mg | DELAYED_RELEASE_TABLET | Freq: Every day | ORAL | Status: DC
  Administered 2022-05-03 – 2022-05-05 (×3): 325 mg via ORAL
  Filled 2022-05-02 (×3): qty 1

## 2022-05-02 NOTE — Progress Notes (Addendum)
STROKE TEAM PROGRESS NOTE   INTERVAL HISTORY No family is at the bedside. She is awake and has a right gaze preference.   Unable to have MRI due to pacemaker, will repeat CT today.  Resume metoprolol low dose and pain medications PRN   Vitals:   05/02/22 0600 05/02/22 0630 05/02/22 0700 05/02/22 0800  BP: 130/65 (!) 129/59 130/64 128/73  Pulse: 95 95 98 93  Resp: (!) '23 19 20 20  '$ Temp:      TempSrc:      SpO2: 95% 95% 97% 96%  Weight:      Height:       CBC:  Recent Labs  Lab 05/01/22 1435 05/02/22 0619  WBC SPECIMEN CLOTTED 11.9*  NEUTROABS SPECIMEN CLOTTED  --   HGB SPECIMEN CLOTTED 12.8  HCT SPECIMEN CLOTTED 37.6  MCV SPECIMEN CLOTTED 96.4  PLT SPECIMEN CLOTTED 789   Basic Metabolic Panel:  Recent Labs  Lab 05/01/22 1650  NA 138  K 2.7*  CL 101  CO2 25  GLUCOSE 198*  BUN 12  CREATININE 0.82  CALCIUM 8.3*   Lipid Panel: No results for input(s): "CHOL", "TRIG", "HDL", "CHOLHDL", "VLDL", "LDLCALC" in the last 168 hours. HgbA1c: No results for input(s): "HGBA1C" in the last 168 hours. Urine Drug Screen: No results for input(s): "LABOPIA", "COCAINSCRNUR", "LABBENZ", "AMPHETMU", "THCU", "LABBARB" in the last 168 hours.  Alcohol Level  Recent Labs  Lab 05/01/22 1437  ETH <10    IMAGING past 24 hours CT ANGIO HEAD NECK W WO CM (CODE STROKE)  Result Date: 05/01/2022 CLINICAL DATA:  Stroke follow-up EXAM: CT ANGIOGRAPHY HEAD AND NECK TECHNIQUE: Multidetector CT imaging of the head and neck was performed using the standard protocol during bolus administration of intravenous contrast. Multiplanar CT image reconstructions and MIPs were obtained to evaluate the vascular anatomy. Carotid stenosis measurements (when applicable) are obtained utilizing NASCET criteria, using the distal internal carotid diameter as the denominator. RADIATION DOSE REDUCTION: This exam was performed according to the departmental dose-optimization program which includes automated exposure  control, adjustment of the mA and/or kV according to patient size and/or use of iterative reconstruction technique. CONTRAST:  38m OMNIPAQUE IOHEXOL 350 MG/ML SOLN COMPARISON:  None Available. FINDINGS: CT HEAD FINDINGS Please see same-day CT brain for intracranial findings. Review of the MIP images confirms the above findings CTA NECK FINDINGS Aortic arch: Standard branching. Imaged portion shows no evidence of aneurysm or dissection. No significant stenosis of the major arch vessel origins. Right carotid system: No evidence of dissection, stenosis (50% or greater), or occlusion. Left carotid system: No evidence of dissection, stenosis (50% or greater), or occlusion. Vertebral arteries: Codominant. No evidence of dissection, stenosis (50% or greater), or occlusion. Skeleton: Negative. Other neck: Negative. Upper chest: Small left pleural effusion. Partially visualized cardiac device in place. Review of the MIP images confirms the above findings CTA HEAD FINDINGS Anterior circulation: There is thrombus of the right M1/M2 junction with associated occlusion and nearly absent arborization in the right MCA territory. There is thrombus in an acute occlusion in the distal A2 segment of the right ACA (series 7, image 58). Posterior circulation: There is a fetal PCA on the right with the P2 segment supplied by the right PCOM. The P2 segment of the right PCA is focally occluded (series 7, image 104). Venous sinuses: As permitted by contrast timing, patent. Anatomic variants: Fetal PCA on the right. Review of the MIP images confirms the above findings IMPRESSION: 1. Acute occlusion at the right  M1/M2 junction with nearly absent arborization in the right MCA territory. 2. Thrombus and acute occlusion of the distal A2 segment of the right ACA. 3. Focal occlusion of the P2 segment of the right PCA. 4. No hemodynamically significant stenosis in the neck. 5. Small bilateral pleural effusions. Findings were discussed with Dr.  Cheral Marker on 05/01/22 at 2:44 PM. Electronically Signed   By: Marin Roberts M.D.   On: 05/01/2022 15:02   CT HEAD CODE STROKE WO CONTRAST  Result Date: 05/01/2022 CLINICAL DATA:  Code stroke.  No history provided. EXAM: CT HEAD WITHOUT CONTRAST TECHNIQUE: Contiguous axial images were obtained from the base of the skull through the vertex without intravenous contrast. RADIATION DOSE REDUCTION: This exam was performed according to the departmental dose-optimization program which includes automated exposure control, adjustment of the mA and/or kV according to patient size and/or use of iterative reconstruction technique. COMPARISON:  CT Brain 06/09/19 FINDINGS: Brain: Age-indeterminate, possibly acute infarct in the right cerebellum. No hemorrhage. No hydrocephalus. No extra-axial fluid collection. Vascular: Dense atherosclerotic calcifications involving the bilateral V4 segments of the vertebral arteries. Skull: Normal. Negative for fracture or focal lesion. Sinuses/Orbits: No acute finding. Other: None. ASPECTS [facility] Stroke Program Early CT Score): 10 IMPRESSION: Age-indeterminate, possibly acute infarct in the right cerebellum. No hemorrhage. Findings were paged to Dr. Cheral Marker on 05/01/22 at 2:26 PM via [facility] paging system. Electronically Signed   By: Marin Roberts M.D.   On: 05/01/2022 14:27    PHYSICAL EXAM  Physical Exam  Constitutional: Appears well-developed and well-nourished.   Cardiovascular: Normal rate and regular rhythm.  Respiratory: Effort normal, non-labored breathing  Neuro: Mental Status: Patient is awake, alert,  Mild dysarthria  Neglecting left arm- states it is the examiners  Cranial Nerves: II: Visual Fields are full. Pupils are equal, round, and reactive to light.   III,IV, VI: Nystagmus with leftward gaze, but able to cross midline V: Facial sensation is symmetric to temperature VII: Facial movement  VIII: Hearing is intact to voice X: Palate elevates symmetrically XI:  Shoulder shrug is symmetric. XII: Tongue protrudes midline without atrophy or fasciculations.  Motor: Tone is normal. Bulk is normal.  RUE 5/5 LUE 3/5 RLE 5/5 LLE 3/5 Sensory: Sensation is symmetric to light touch and temperature in the arms and legs. No extinction to DSS present.  Cerebellar: FNF and HKS are intact bilaterally    ASSESSMENT/PLAN Ms. TATISHA CERINO is a 88 y.o. female with history of atrial fibrillation, CAD, DM, glaucoma, pacemaker, HLD, HTN, anemia, and OSA presenting from her assisted living facility with an acute onset of left facial droop, left hemiplegia, left hemi neglect, right gaze deviation, and dysarthria. Baseline MRS is a 2. TNK given and taken for an emergent mechanical thrombectomy.  Of note she did have a fall 2 years ago which resulted in a small ICH, she has not been on Coumadin since then.  Stroke: Right brain infarct due to right MCA, A2 and P2 occlusion s/p TNK and IR with TICI3, etiology likely due to Afib not on [facility] Code Stroke CT head Age-indeterminate, possibly acute infarct in the right cerebellum. No hemorrhage. CTA head & neck Acute occlusion at the right M1/M2 junction with nearly absent arborization in the right MCA territory. Thrombus and acute occlusion of the distal A2 segment of the right ACA. Focal occlusion of the P2 segment of the right PCA.  MRI - pacer not compatible with MRI Repeat CT Head at 24h of TNK - large right MCA  infarct without overt bleeding 2D Echo EF is 60-65% LDL 23 HgbA1c 6.8 VTE prophylaxis - heparin subq No antithrombotic prior to admission, now on ASA '325mg'$ . Consider anticoagulation in 3-5 days given large stroke size.  Therapy recommendations: Pending Disposition: Pending  Atrial fibrillation Not on anticoagulation Rate control: Toprol XL '25mg'$  BID? Metoprolol tartrate 12.5 BID  Rate controlled now on ASA '325mg'$ . Consider anticoagulation in 3-5 days given large stroke size.   Hypertension Home meds:  Metoprolol Stable Blood pressure goal 120-140 24 hours post IR IV Cleviprex infusing  Diabetes type II Controlled Home meds: None HgbA1c 6.8, goal < 7.0 CBGs SSI  Dysphagia post stroke SLP on board Currently recommending dysphagia 2 with thins   Other Stroke Risk Factors Advanced Age >/= 34  OSA CAD Hx stroke/TIA History of ICH post fall  Other Active Problems Back pain- Home meds tramadol 50 mg every 12, lidocaine patch Pacemaker in place  Hospital day # 1  Patient seen and examined by NP/APP with MD. MD to update note as needed.   Janine Ores, DNP, FNP-BC [facility] Pager: [phone]  ATTENDING NOTE: I reviewed above note and agree with the assessment and plan. Pt was seen and examined.   88 year old female with history of hypertension, hyperlipidemia, diabetes, CAD, A-fib not on AC, pacemaker, OSA admitted for left-sided facial droop, left weakness, left neglect and right gaze with slurred speech.  CT no acute abnormality, questionable right cerebellar infarct.  CTA head and neck showed right M1/M2 occlusion, right distal A2 occlusion, right P2 PCA P2 focal occlusion.  Bilateral ICA bulb and siphon atherosclerosis.  Status post IR with right M1 and P1 occlusion with TICI3 reperfusion.  EF 60 to 65%, LDL 23, A1c 6.8.  Potassium 2.7-3.6.  Creatinine 0.82  On exam, no family at the bedside. Pt lying in bed, eyes open, awake alert, orientated to place, time, age, DOB but not to situation. She has no aphasia, fluent in language but mild to moderate dysarthria.  Follows all simple commands.  Right gaze preference, able to have left gaze, blinking to visual threat on the right but not on the left.  Slight left nasolabial fold flattening.  Tongue midline.  Left upper extremity proximal 2/5, distal 3/5.  Left lower extremity 3/5.  Right upper extremity at least 4/5, right lower extremity 3/5.  Sensation decreased on the left upper extremity and diminished on the  left lower extremity.  Right finger-to-nose intact.   Etiology of patient stroke likely due to A-fib not on [facility].  Currently on aspirin 325 after 24 hours of TNK, will consider AC in 3 to 5 days.  Speech on board, passes swallow.  PT/OT/speech.  For detailed assessment and plan, please refer to above/below as I have made changes wherever appropriate.   Rosalin Hawking, MD PhD Stroke Neurology 05/02/2022 4:16 PM  This patient is critically ill due to right large MCA infarct, status post IR and TNK, A-fib not on AC and at significant risk of neurological worsening, death form recurrent stroke, hemorrhagic transformation, bleeding from TNK, heart failure, seizure. This patient's care requires constant monitoring of vital signs, hemodynamics, respiratory and cardiac monitoring, review of multiple databases, neurological assessment, discussion with family, other specialists and medical decision making of high complexity. I spent 35 minutes of neurocritical care time in the care of this patient.    To contact Stroke Continuity provider, please refer to [email or web address] After hours, contact General Neurology

## 2022-05-02 NOTE — Evaluation (Signed)
Physical Therapy Evaluation Patient Details Name: Kara [name] MRN: [id number] DOB: 07/12/1922 Today's Date: 05/02/2022  History of Present Illness  88 y.o. female presents to [facility] hospital on 05/01/2022 with acute onset left facial droop, hemiplegia and hemineglect. CTA reveals occlusion at R M1/M2 junction, distal A2 segment of R ACA, and P2 segment of R PCA. Pt received TNK and underwent mechanical thrombectomy on 12/14. PMH includes Afib, CAD, uterine cancer, L2 compression fx, DM, glaucoma, HTN, HLD.  Clinical Impression  Pt presents to PT with deficits in strength, power, awareness, balance. Pt demonstrates L hemiplegia and L neglect during session, with leftward lateral lean, all placing her at a high risk for falls. Pt has very poor awareness of her deficits, and is often unaware of her left extremities, reporting that they are the physical therapist's and not her own. Pt will benefit from aggressive mobilization in an effort to improve L sided strength and awareness, in the hopes of restoring the pt's ability to ambulate and to reduce her risk for falls/injury. PT recommends AIR consult at this time.     Recommendations for follow up therapy are one component of a multi-disciplinary discharge planning process, led by the attending physician.  Recommendations may be updated based on patient status, additional functional criteria and insurance authorization.  Follow Up Recommendations Acute inpatient rehab (3hours/day)      Assistance Recommended at Discharge Frequent or constant Supervision/Assistance  Patient can return home with the following  A lot of help with walking and/or transfers;A lot of help with bathing/dressing/bathroom    Equipment Recommendations Wheelchair (measurements PT);Hospital bed  Recommendations for Other Services  Rehab consult    Functional Status Assessment Patient has had a recent decline in their functional status and demonstrates the ability to make  significant improvements in function in a reasonable and predictable amount of time.     Precautions / Restrictions Precautions Precautions: Fall Precaution Comments: L neglect Restrictions Weight Bearing Restrictions: No      Mobility  Bed Mobility Overal bed mobility: Needs Assistance Bed Mobility: Supine to Sit     Supine to sit: Max assist, HOB elevated          Transfers Overall transfer level: Needs assistance Equipment used: 1 person hand held assist Transfers: Sit to/from Stand, Bed to chair/wheelchair/BSC Sit to Stand: Mod assist Stand pivot transfers: Max assist         General transfer comment: left lateral lean, facilitation of weight shift at trunk to encourage stepping with left foot as pt is neglectful of left side    Ambulation/Gait                  Stairs            Wheelchair Mobility    Modified Rankin (Stroke Patients Only) Modified Rankin (Stroke Patients Only) Pre-Morbid Rankin Score: Moderate disability Modified Rankin: Severe disability     Balance Overall balance assessment: Needs assistance Sitting-balance support: Single extremity supported, Feet supported Sitting balance-Leahy Scale: Poor Sitting balance - Comments: strong left lateral lean, mod-maxA to correct. Pt is able to lean to right side with request, but without cues falls to left Postural control: Left lateral lean Standing balance support: Single extremity supported, Bilateral upper extremity supported Standing balance-Leahy Scale: Poor Standing balance comment: mod-maxA                             Pertinent Vitals/Pain Pain Assessment Pain  Assessment: No/denies pain    Home Living Family/patient expects to be discharged to:: Other (Comment) (Independent Living, [place]) Living Arrangements: Alone Available Help at Discharge: Available PRN/intermittently;Personal care attendant (PRN PCA, step-son PRN also per pt report) Type of Home:  Independent living facility Home Access: Level entry       Home Layout: One level Home Equipment: Cane - single Barista (2 wheels);Rollator (4 wheels)      Prior Function Prior Level of Function : Needs assist             Mobility Comments: PCA assists with groceries and cleaning. Pt ambulates with rollator       Hand Dominance   Dominant Hand: Right    Extremity/Trunk Assessment   Upper Extremity Assessment Upper Extremity Assessment: LUE deficits/detail LUE Deficits / Details: variable performance, PT observed physician neuro screen with antigravity movement of LUE, however during session pt with difficulty lifting LUE, grossly 2/5 during PT eval LUE Sensation: decreased light touch    Lower Extremity Assessment Lower Extremity Assessment: LLE deficits/detail LLE Deficits / Details: grossly 3/5 LLE Sensation: decreased light touch    Cervical / Trunk Assessment Cervical / Trunk Assessment: Kyphotic (pt with tendency for right cervical rotation, can turn to left with cueing)  Communication   Communication: No difficulties  Cognition Arousal/Alertness: Awake/alert Behavior During Therapy: Impulsive Overall Cognitive Status: Impaired/Different from baseline Area of Impairment: Orientation, Attention, Memory, Following commands, Safety/judgement, Awareness, Problem solving                 Orientation Level: Disoriented to, Place Current Attention Level: Sustained Memory: Decreased recall of precautions, Decreased short-term memory Following Commands: Follows one step commands with increased time Safety/Judgement: Decreased awareness of safety, Decreased awareness of deficits Awareness: Intellectual Problem Solving: Slow processing, Difficulty sequencing, Requires verbal cues General Comments: left neglect        General Comments General comments (skin integrity, edema, etc.): VSS on RA, pt in afib    Exercises     Assessment/Plan     PT Assessment Patient needs continued PT services  PT Problem List Decreased strength;Decreased activity tolerance;Decreased balance;Decreased mobility;Decreased cognition;Decreased knowledge of use of DME;Decreased safety awareness;Decreased knowledge of precautions;Impaired sensation       PT Treatment Interventions DME instruction;Gait training;Functional mobility training;Therapeutic activities;Therapeutic exercise;Balance training;Neuromuscular re-education;Patient/family education;Cognitive remediation    PT Goals (Current goals can be found in the Care Plan section)  Acute Rehab PT Goals Patient Stated Goal: to regain strength and mobility PT Goal Formulation: With patient Time For Goal Achievement: 05/16/22 Potential to Achieve Goals: Fair    Frequency Min 4X/week     Co-evaluation               AM-PAC PT "6 Clicks" Mobility  Outcome Measure Help needed turning from your back to your side while in a flat bed without using bedrails?: A Lot Help needed moving from lying on your back to sitting on the side of a flat bed without using bedrails?: A Lot Help needed moving to and from a bed to a chair (including a wheelchair)?: A Lot Help needed standing up from a chair using your arms (e.g., wheelchair or bedside chair)?: A Lot Help needed to walk in hospital room?: Total Help needed climbing 3-5 steps with a railing? : Total 6 Click Score: 10    End of Session   Activity Tolerance: Patient tolerated treatment well Patient left: in chair;with call bell/phone within reach;with chair alarm set Nurse Communication: Mobility status;Need  for lift equipment PT Visit Diagnosis: Other abnormalities of gait and mobility (R26.89);Other symptoms and signs involving the nervous system (R29.898);Muscle weakness (generalized) (M62.81);Hemiplegia and hemiparesis Hemiplegia - Right/Left: Left Hemiplegia - dominant/non-dominant: Non-dominant Hemiplegia - caused by: Cerebral  infarction    Time: 3692-2300 PT Time Calculation (min) (ACUTE ONLY): 29 min   Charges:   PT Evaluation $PT Eval Moderate Complexity: 1 Mod          Zenaida Niece, PT, DPT Acute Rehabilitation Office [phone]   Zenaida Niece 05/02/2022, 4:50 PM

## 2022-05-02 NOTE — Progress Notes (Signed)
Supervising Physician: Pedro Earls  Patient Status:  [facility] - In-pt  Chief Complaint:  CVA, 1 day s/p mechanical thrombectomy  Subjective:  Sitting up in bed, preferential gaze to the right and body leaning to the left.  Reports she is doing well.  Daughter, Lattie Haw, bedside.  Allergies: Baycol [cerivastatin sodium], Cardizem [diltiazem hcl], Crestor [rosuvastatin calcium], Dronedarone, Metoprolol tartrate, Omeprazole, Pravachol, Statins, Vioxx [rofecoxib], and Zocor [simvastatin]  Medications: Prior to Admission medications   Medication Sig Start Date End Date Taking? Authorizing Provider  acetaminophen (TYLENOL) 500 MG tablet Take 2 tablets (1,000 mg total) by mouth every 12 (twelve) hours. 11/18/21  Yes Crecencio Mc, MD  Cholecalciferol (VITAMIN D) 50 MCG (2000 UT) CAPS Take 1 capsule by mouth daily at 2 am.   Yes [provider]  Cranberry 250 MG TABS Take 2 tablets by mouth daily at 2 am.   Yes [provider]  cyanocobalamin (,VITAMIN B-12,) 1000 MCG/ML injection INJECT 1ML INTO THE MUSCLE EVERY 14 DAYS Patient taking differently: Inject 1,000 mcg into the muscle See admin instructions. INJECT 1ML INTO THE MUSCLE EVERY 14 DAYS 10/18/21  Yes Crecencio Mc, MD  diphenoxylate-atropine (LOMOTIL) 2.5-0.025 MG tablet TAKE 1 TABLET BY MOUTH THREE TIMES DAILYAS NEEDED FOR DIARRHEA Patient taking differently: Take 1 tablet by mouth 3 (three) times daily as needed for diarrhea or loose stools. 03/06/22  Yes Crecencio Mc, MD  estradiol (ESTRACE) 0.1 MG/GM vaginal cream Place a dab of cream around the urethra nightly for 2 weeks,  then twice weekly thereafter 08/14/21  Yes Crecencio Mc, MD  [place] [facility]) OINT Apply 1 application topically as needed. 07/20/20  Yes Crecencio Mc, MD  Lidocaine (SALONPAS PAIN RELIEVING) 4 % PTCH Apply 1 patch daily to your back and upper arm. 08/20/21  Yes Crecencio Mc, MD  metoprolol succinate  (TOPROL-XL) 25 MG 24 hr tablet TAKE 1 TABLET BY MOUTH IN THE MORNING AND AT BEDTIME Patient taking differently: Take 25 mg by mouth 2 (two) times daily at 8 am and 10 pm. 01/15/22  Yes Crecencio Mc, MD  traMADol (ULTRAM) 50 MG tablet Take 1 tablet (50 mg total) by mouth every 12 (twelve) hours as needed for moderate pain. 11/18/21  Yes Crecencio Mc, MD  triamcinolone cream (KENALOG) 0.1 % APPLY 1 APPLICATION TOPICALLY 2 TIMES DAILY TO VULVA UNTIL ITCHING RESOLVES THEN REDUCE USE TO TWICE WEEKLY. Patient taking differently: Apply 1 Application topically See admin instructions. APPLY 1 APPLICATION TOPICALLY 2 TIMES DAILY TO VULVA UNTIL ITCHING RESOLVES THEN REDUCE USE TO TWICE WEEKLY. 04/22/22  Yes Crecencio Mc, MD  diclofenac Sodium (VOLTAREN) 1 % GEL Apply 2 g topically 4 (four) times daily. To painful joints as needed Patient not taking: Reported on 05/01/2022 11/18/21   Crecencio Mc, MD  ondansetron (ZOFRAN) 4 MG tablet Take 1 tablet (4 mg total) by mouth every 6 (six) hours as needed for nausea. Patient not taking: Reported on 05/01/2022 11/16/20   Ezekiel Slocumb, DO     Vital Signs: BP 135/69   Pulse 86   Temp 97.7 F (36.5 C) (Axillary)   Resp (!) 25   Ht _0  (1.549 m)   Wt 133 lb 2.5 oz (60.4 kg)   SpO2 98%   BMI 25.16 kg/m   Physical Exam Vitals reviewed.  HENT:     Head: Normocephalic and atraumatic.     Mouth/Throat:     Mouth:  Mucous membranes are moist.     Pharynx: Oropharynx is clear.  Eyes:     General: No visual field deficit.    Comments: Preferential right gaze, but able to cross midline.  Cardiovascular:     Rate and Rhythm: Normal rate.     Pulses: Normal pulses.  Pulmonary:     Effort: Pulmonary effort is normal. No respiratory distress.  Skin:    General: Skin is warm and dry.     Comments: Right CFA soft, dry, non-tender, no pseudoaneurysm  Neurological:     General: No focal deficit present.     Mental Status: She is alert and oriented to  person, place, and time.     Cranial Nerves: No cranial nerve deficit, dysarthria or facial asymmetry.     Comments: Decreased strength in left arm, with minimal ability to raise arm and weak grip. Decreased strength in left leg, with reduced ability to raise leg and weak plantar and dorsiflexion.     Imaging: IR PERCUTANEOUS ART THROMBECTOMY/INFUSION INTRACRANIAL INC DIAG ANGIO  Result Date: 05/02/2022 INDICATION: BRIONA KORPELA is a 88 year old female presenting with left facial droop, left hemiplegia, left hemineglect, right gaze deviation and dysarthria; NIHSS 19. Her last known well was 13:20 on 05/01/2022. Her past medical history is significant for atrial fibrillation, CAD, DM, glaucoma, pacemaker, HLD, HTN, anemia and OSA.; baseline modified Rankin scale 1-2. Head CT showed no lartge acute territorial infarct or hemorrhage. Patient received IV TNK. CTA angiogram of the head and neck showed occlusion of the distal right M1/MCA, mid P2/PCA and right A3-A4/ACA. Patient was then transferred to our servicefor mechanical thrombectomy. EXAM: ULTRASOUND GUIDED VASCULAR ACCESS DIAGNOSTIC CEREBRAL ANGIOGRAM MECHANICAL THROMBECTOMY (ANTERIOR CIRCULATION) MECHANICAL THROMBECTOMY (POSTERIOR CIRCULATION) FLAT PANEL HEAD CT COMPARISON:  CT/CT angiogram head neck May 01, 2022. MEDICATIONS: No antibiotics administered. ANESTHESIA/SEDATION: The procedure was performed under general anesthesia. CONTRAST:  90 mL of Omnipaque FLUOROSCOPY: Radiation Exposure Index (as provided by the fluoroscopic device): 027 mGy Kerma COMPLICATIONS: None immediate. TECHNIQUE: Informed written consent was obtained from the patient after a thorough discussion of the procedural risks, benefits and alternatives. All questions were addressed. Maximal Sterile Barrier Technique was utilized including caps, mask, sterile gowns, sterile gloves, sterile drape, hand hygiene and skin antiseptic. A timeout was performed prior to the initiation  of the procedure. The right groin was prepped and draped in the usual sterile fashion. Using a micropuncture kit and the modified Seldinger technique, access was gained to the right common femoral artery and an 8 French sheath was placed. Real-time ultrasound guidance was utilized for vascular access including the acquisition of a permanent ultrasound image documenting patency of the accessed vessel. Under fluoroscopy, a Zoom 88 guide catheter was navigated over a 6 [place] VTK catheter and a 0.035" Terumo Glidewire into the aortic arch. The catheter was placed into the right common carotid artery and then advanced into the right internal carotid artery. The diagnostic catheter was removed. Frontal and lateral angiograms of the head were obtained. FINDINGS: 1. There is an occlusion of the mid/distal right M1/MCA. 2. Occlusion of the P2 segment of the fetal right PCA. 3. Patent proximal right ACA with occlusion at the A5 segment. PROCEDURE: Using biplane roadmap guidance, a Freeclimb 70 aspiration catheter was navigated over a Tenzing delivery catheter into the right M1/MCA. The aspiration catheter was then advanced to the level of occlusion and connected to an aspiration pump. Continuous aspiration was performed for 2 minutes. The guide catheter was  connected to a VacLok syringe. The aspiration catheter was subsequently removed under constant aspiration. The guide catheter was aspirated for debris. Right internal carotid artery angiograms with frontal lateral views of the head showed recanalization of the segment with persistent occlusion of the distal right M3/MCA posterior division branch. Using biplane roadmap guidance, a Zoom 35 aspiration catheter was navigated over an Aristotle 24 microguidewire into the cavernous segment of the right ICA. The aspiration catheter was then advanced to the level of occlusion and connected to an aspiration pump. Continuous aspiration was performed for 2 minutes. The guide catheter  was connected to a VacLok syringe. The aspiration catheter was subsequently removed under constant aspiration. The guide catheter was aspirated for debris. Right internal carotid artery angiograms with frontal lateral views of the head showed complete recanalization of the right MCA vascular tree (TICI 3). Persistent occlusion of the P2/PCA segment was seen. Using biplane roadmap guidance, a Zoom 35 aspiration catheter was navigated over an Aristotle 24 microguidewire into the cavernous segment of the right ICA. The aspiration catheter was then advanced over the wire to the level of occlusion in the right P2 segment and connected to an aspiration pump. Continuous aspiration was performed for 2 minutes. The guide catheter was connected to a VacLok syringe. The aspiration catheter was subsequently removed under constant aspiration. The guide catheter was aspirated for debris. Right internal carotid artery angiograms with frontal lateral views of the head showed persistent occlusion of the right P2/PCA segment. Using biplane roadmap guidance, a Zoom 55 aspiration catheter was navigated over an Aristotle 24 microguidewire into the cavernous segment of the right ICA. The aspiration catheter was then advanced over the wire to the level of occlusion in the right P2 segment and connected to an aspiration pump. Continuous aspiration was performed for 2 minutes. The guide catheter was connected to a VacLok syringe. The aspiration catheter was subsequently removed under constant aspiration. The guide catheter was aspirated for debris. Right internal carotid artery angiogram showed complete recanalization of the fetal right PCA. No embolus to new territory seen. Flat panel CT of the head was obtained and post processed in a separate workstation with concurrent attending physician supervision. Selected images were sent to PACS. No evidence of hemorrhagic complication. Right common femoral artery angiogram was obtained in right  anterior oblique view. The puncture is at the level of the common femoral artery. Diffuse atherosclerotic the right femoral and iliac arteries with preserved caliber, adequate for closure device. The sheath was exchanged over the wire for an 8 [place] Angio-Seal which was utilized for access closure. Immediate hemostasis was achieved. IMPRESSION: 1. Mechanical thrombectomy performed for treatment of a distal right M1/MCA and fetal right P2/PCA occlusions achieving complete recanalization in both territories (TICI 3). 2. No embolus to new territory. 3. No hemorrhagic complication. PLAN: Transfer to ICU for continued care. Electronically Signed   By: Pedro Earls M.D.   On: 05/02/2022 11:52   IR US Guide Vasc Access Right  Result Date: 05/02/2022 INDICATION: HAJER DWYER is a 88 year old female presenting with left facial droop, left hemiplegia, left hemineglect, right gaze deviation and dysarthria; NIHSS 19. Her last known well was 13:20 on 05/01/2022. Her past medical history is significant for atrial fibrillation, CAD, DM, glaucoma, pacemaker, HLD, HTN, anemia and OSA.; baseline modified Rankin scale 1-2. Head CT showed no lartge acute territorial infarct or hemorrhage. Patient received IV TNK. CTA angiogram of the head and neck showed occlusion of the distal  right M1/MCA, mid P2/PCA and right A3-A4/ACA. Patient was then transferred to our servicefor mechanical thrombectomy. EXAM: ULTRASOUND GUIDED VASCULAR ACCESS DIAGNOSTIC CEREBRAL ANGIOGRAM MECHANICAL THROMBECTOMY (ANTERIOR CIRCULATION) MECHANICAL THROMBECTOMY (POSTERIOR CIRCULATION) FLAT PANEL HEAD CT COMPARISON:  CT/CT angiogram head neck May 01, 2022. MEDICATIONS: No antibiotics administered. ANESTHESIA/SEDATION: The procedure was performed under general anesthesia. CONTRAST:  90 mL of Omnipaque FLUOROSCOPY: Radiation Exposure Index (as provided by the fluoroscopic device): 973 mGy Kerma COMPLICATIONS: None immediate. TECHNIQUE:  Informed written consent was obtained from the patient after a thorough discussion of the procedural risks, benefits and alternatives. All questions were addressed. Maximal Sterile Barrier Technique was utilized including caps, mask, sterile gowns, sterile gloves, sterile drape, hand hygiene and skin antiseptic. A timeout was performed prior to the initiation of the procedure. The right groin was prepped and draped in the usual sterile fashion. Using a micropuncture kit and the modified Seldinger technique, access was gained to the right common femoral artery and an 8 French sheath was placed. Real-time ultrasound guidance was utilized for vascular access including the acquisition of a permanent ultrasound image documenting patency of the accessed vessel. Under fluoroscopy, a Zoom 88 guide catheter was navigated over a 6 [place] VTK catheter and a 0.035" Terumo Glidewire into the aortic arch. The catheter was placed into the right common carotid artery and then advanced into the right internal carotid artery. The diagnostic catheter was removed. Frontal and lateral angiograms of the head were obtained. FINDINGS: 1. There is an occlusion of the mid/distal right M1/MCA. 2. Occlusion of the P2 segment of the fetal right PCA. 3. Patent proximal right ACA with occlusion at the A5 segment. PROCEDURE: Using biplane roadmap guidance, a Freeclimb 70 aspiration catheter was navigated over a Tenzing delivery catheter into the right M1/MCA. The aspiration catheter was then advanced to the level of occlusion and connected to an aspiration pump. Continuous aspiration was performed for 2 minutes. The guide catheter was connected to a VacLok syringe. The aspiration catheter was subsequently removed under constant aspiration. The guide catheter was aspirated for debris. Right internal carotid artery angiograms with frontal lateral views of the head showed recanalization of the segment with persistent occlusion of the distal right  M3/MCA posterior division branch. Using biplane roadmap guidance, a Zoom 35 aspiration catheter was navigated over an Aristotle 24 microguidewire into the cavernous segment of the right ICA. The aspiration catheter was then advanced to the level of occlusion and connected to an aspiration pump. Continuous aspiration was performed for 2 minutes. The guide catheter was connected to a VacLok syringe. The aspiration catheter was subsequently removed under constant aspiration. The guide catheter was aspirated for debris. Right internal carotid artery angiograms with frontal lateral views of the head showed complete recanalization of the right MCA vascular tree (TICI 3). Persistent occlusion of the P2/PCA segment was seen. Using biplane roadmap guidance, a Zoom 35 aspiration catheter was navigated over an Aristotle 24 microguidewire into the cavernous segment of the right ICA. The aspiration catheter was then advanced over the wire to the level of occlusion in the right P2 segment and connected to an aspiration pump. Continuous aspiration was performed for 2 minutes. The guide catheter was connected to a VacLok syringe. The aspiration catheter was subsequently removed under constant aspiration. The guide catheter was aspirated for debris. Right internal carotid artery angiograms with frontal lateral views of the head showed persistent occlusion of the right P2/PCA segment. Using biplane roadmap guidance, a Zoom 55 aspiration catheter was navigated over  an Aristotle 24 microguidewire into the cavernous segment of the right ICA. The aspiration catheter was then advanced over the wire to the level of occlusion in the right P2 segment and connected to an aspiration pump. Continuous aspiration was performed for 2 minutes. The guide catheter was connected to a VacLok syringe. The aspiration catheter was subsequently removed under constant aspiration. The guide catheter was aspirated for debris. Right internal carotid artery  angiogram showed complete recanalization of the fetal right PCA. No embolus to new territory seen. Flat panel CT of the head was obtained and post processed in a separate workstation with concurrent attending physician supervision. Selected images were sent to PACS. No evidence of hemorrhagic complication. Right common femoral artery angiogram was obtained in right anterior oblique view. The puncture is at the level of the common femoral artery. Diffuse atherosclerotic the right femoral and iliac arteries with preserved caliber, adequate for closure device. The sheath was exchanged over the wire for an 8 [place] Angio-Seal which was utilized for access closure. Immediate hemostasis was achieved. IMPRESSION: 1. Mechanical thrombectomy performed for treatment of a distal right M1/MCA and fetal right P2/PCA occlusions achieving complete recanalization in both territories (TICI 3). 2. No embolus to new territory. 3. No hemorrhagic complication. PLAN: Transfer to ICU for continued care. Electronically Signed   By: Pedro Earls M.D.   On: 05/02/2022 11:52   IR CT Head Ltd  Result Date: 05/02/2022 INDICATION: ATIANA LEVIER is a 88 year old female presenting with left facial droop, left hemiplegia, left hemineglect, right gaze deviation and dysarthria; NIHSS 19. Her last known well was 13:20 on 05/01/2022. Her past medical history is significant for atrial fibrillation, CAD, DM, glaucoma, pacemaker, HLD, HTN, anemia and OSA.; baseline modified Rankin scale 1-2. Head CT showed no lartge acute territorial infarct or hemorrhage. Patient received IV TNK. CTA angiogram of the head and neck showed occlusion of the distal right M1/MCA, mid P2/PCA and right A3-A4/ACA. Patient was then transferred to our servicefor mechanical thrombectomy. EXAM: ULTRASOUND GUIDED VASCULAR ACCESS DIAGNOSTIC CEREBRAL ANGIOGRAM MECHANICAL THROMBECTOMY (ANTERIOR CIRCULATION) MECHANICAL THROMBECTOMY (POSTERIOR CIRCULATION) FLAT PANEL  HEAD CT COMPARISON:  CT/CT angiogram head neck May 01, 2022. MEDICATIONS: No antibiotics administered. ANESTHESIA/SEDATION: The procedure was performed under general anesthesia. CONTRAST:  90 mL of Omnipaque FLUOROSCOPY: Radiation Exposure Index (as provided by the fluoroscopic device): 720 mGy Kerma COMPLICATIONS: None immediate. TECHNIQUE: Informed written consent was obtained from the patient after a thorough discussion of the procedural risks, benefits and alternatives. All questions were addressed. Maximal Sterile Barrier Technique was utilized including caps, mask, sterile gowns, sterile gloves, sterile drape, hand hygiene and skin antiseptic. A timeout was performed prior to the initiation of the procedure. The right groin was prepped and draped in the usual sterile fashion. Using a micropuncture kit and the modified Seldinger technique, access was gained to the right common femoral artery and an 8 French sheath was placed. Real-time ultrasound guidance was utilized for vascular access including the acquisition of a permanent ultrasound image documenting patency of the accessed vessel. Under fluoroscopy, a Zoom 88 guide catheter was navigated over a 6 [place] VTK catheter and a 0.035" Terumo Glidewire into the aortic arch. The catheter was placed into the right common carotid artery and then advanced into the right internal carotid artery. The diagnostic catheter was removed. Frontal and lateral angiograms of the head were obtained. FINDINGS: 1. There is an occlusion of the mid/distal right M1/MCA. 2. Occlusion of the P2 segment of  the fetal right PCA. 3. Patent proximal right ACA with occlusion at the A5 segment. PROCEDURE: Using biplane roadmap guidance, a Freeclimb 70 aspiration catheter was navigated over a Tenzing delivery catheter into the right M1/MCA. The aspiration catheter was then advanced to the level of occlusion and connected to an aspiration pump. Continuous aspiration was performed for 2  minutes. The guide catheter was connected to a VacLok syringe. The aspiration catheter was subsequently removed under constant aspiration. The guide catheter was aspirated for debris. Right internal carotid artery angiograms with frontal lateral views of the head showed recanalization of the segment with persistent occlusion of the distal right M3/MCA posterior division branch. Using biplane roadmap guidance, a Zoom 35 aspiration catheter was navigated over an Aristotle 24 microguidewire into the cavernous segment of the right ICA. The aspiration catheter was then advanced to the level of occlusion and connected to an aspiration pump. Continuous aspiration was performed for 2 minutes. The guide catheter was connected to a VacLok syringe. The aspiration catheter was subsequently removed under constant aspiration. The guide catheter was aspirated for debris. Right internal carotid artery angiograms with frontal lateral views of the head showed complete recanalization of the right MCA vascular tree (TICI 3). Persistent occlusion of the P2/PCA segment was seen. Using biplane roadmap guidance, a Zoom 35 aspiration catheter was navigated over an Aristotle 24 microguidewire into the cavernous segment of the right ICA. The aspiration catheter was then advanced over the wire to the level of occlusion in the right P2 segment and connected to an aspiration pump. Continuous aspiration was performed for 2 minutes. The guide catheter was connected to a VacLok syringe. The aspiration catheter was subsequently removed under constant aspiration. The guide catheter was aspirated for debris. Right internal carotid artery angiograms with frontal lateral views of the head showed persistent occlusion of the right P2/PCA segment. Using biplane roadmap guidance, a Zoom 55 aspiration catheter was navigated over an Aristotle 24 microguidewire into the cavernous segment of the right ICA. The aspiration catheter was then advanced over the wire  to the level of occlusion in the right P2 segment and connected to an aspiration pump. Continuous aspiration was performed for 2 minutes. The guide catheter was connected to a VacLok syringe. The aspiration catheter was subsequently removed under constant aspiration. The guide catheter was aspirated for debris. Right internal carotid artery angiogram showed complete recanalization of the fetal right PCA. No embolus to new territory seen. Flat panel CT of the head was obtained and post processed in a separate workstation with concurrent attending physician supervision. Selected images were sent to PACS. No evidence of hemorrhagic complication. Right common femoral artery angiogram was obtained in right anterior oblique view. The puncture is at the level of the common femoral artery. Diffuse atherosclerotic the right femoral and iliac arteries with preserved caliber, adequate for closure device. The sheath was exchanged over the wire for an 8 [place] Angio-Seal which was utilized for access closure. Immediate hemostasis was achieved. IMPRESSION: 1. Mechanical thrombectomy performed for treatment of a distal right M1/MCA and fetal right P2/PCA occlusions achieving complete recanalization in both territories (TICI 3). 2. No embolus to new territory. 3. No hemorrhagic complication. PLAN: Transfer to ICU for continued care. Electronically Signed   By: Pedro Earls M.D.   On: 05/02/2022 11:52   ECHOCARDIOGRAM COMPLETE  Result Date: 05/02/2022    ECHOCARDIOGRAM REPORT   Patient Name:   Kara [name] Date of Exam: 05/02/2022 Medical Rec #:  [id number]  Height:       61.0 in Accession #:    [id number]    Weight:       133.2 lb Date of Birth:  01/21/1923      BSA:          1.589 m Patient Age:    29 years      BP:           128/73 mmHg Patient Gender: F             HR:           103 bpm. Exam Location:  Inpatient Procedure: 2D Echo, Cardiac Doppler, Color Doppler and Intracardiac            Opacification  Agent Indications:    Stroke  History:        Patient has prior history of Echocardiogram examinations, most                 recent 11/26/2021. CAD, Pacemaker, Arrythmias:Atrial                 Fibrillation; Risk Factors:Diabetes, Hypertension, Dyslipidemia                 and Sleep Apnea. Anemia.  Sonographer:    Eartha Inch Referring Phys: [phone] ERIC LINDZEN  Sonographer Comments: Technically difficult study due to poor echo windows. Image acquisition challenging due to patient body habitus and Image acquisition challenging due to respiratory motion. IMPRESSIONS  1. Left ventricular ejection fraction, by estimation, is 60 to 65%. The left ventricle has normal function. The left ventricle has no regional wall motion abnormalities. There is mild left ventricular hypertrophy. Left ventricular diastolic parameters are indeterminate.  2. Right ventricular systolic function is mildly reduced. The right ventricular size is normal. There is normal pulmonary artery systolic pressure. The estimated right ventricular systolic pressure is 32.2 mmHg.  3. Left atrial size was severely dilated.  4. Right atrial size was mildly dilated.  5. The mitral valve is abnormal. Mild mitral valve regurgitation. No evidence of mitral stenosis. Moderate mitral annular calcification.  6. Tricuspid valve regurgitation is moderate to severe. Visually TR appears moderate, but does appear to be systolic flow reversal on hepatic vein doppler suggesting possible severe TR  7. The aortic valve is tricuspid. Aortic valve regurgitation is not visualized. Aortic valve sclerosis/calcification is present, without any evidence of aortic stenosis.  8. The inferior vena cava is dilated in size with <50% respiratory variability, suggesting right atrial pressure of 15 mmHg. FINDINGS  Left Ventricle: Left ventricular ejection fraction, by estimation, is 60 to 65%. The left ventricle has normal function. The left ventricle has no regional wall motion  abnormalities. Definity contrast agent was given IV to delineate the left ventricular  endocardial borders. The left ventricular internal cavity size was normal in size. There is mild left ventricular hypertrophy. Left ventricular diastolic parameters are indeterminate. Right Ventricle: The right ventricular size is normal. No increase in right ventricular wall thickness. Right ventricular systolic function is mildly reduced. There is normal pulmonary artery systolic pressure. The tricuspid regurgitant velocity is 2.00 m/s, and with an assumed right atrial pressure of 15 mmHg, the estimated right ventricular systolic pressure is 02.5 mmHg. Left Atrium: Left atrial size was severely dilated. Right Atrium: Right atrial size was mildly dilated. Pericardium: There is no evidence of pericardial effusion. Mitral Valve: The mitral valve is abnormal. Moderate mitral annular calcification. Mild mitral valve regurgitation. No evidence of mitral valve stenosis. MV  peak gradient, 5.5 mmHg. The mean mitral valve gradient is 2.0 mmHg. Tricuspid Valve: The tricuspid valve is normal in structure. Tricuspid valve regurgitation is moderate. Aortic Valve: The aortic valve is tricuspid. Aortic valve regurgitation is not visualized. Aortic valve sclerosis/calcification is present, without any evidence of aortic stenosis. Pulmonic Valve: The pulmonic valve was grossly normal. Pulmonic valve regurgitation is trivial. Aorta: The aortic root and ascending aorta are structurally normal, with no evidence of dilitation. Venous: The inferior vena cava is dilated in size with less than 50% respiratory variability, suggesting right atrial pressure of 15 mmHg. IAS/Shunts: The interatrial septum was not well visualized.  LEFT VENTRICLE PLAX 2D LVIDd:         4.20 cm   Diastology LVIDs:         2.60 cm   LV e' medial:    5.87 cm/s LV PW:         1.30 cm   LV E/e' medial:  17.6 LV IVS:        1.10 cm   LV e' lateral:   6.20 cm/s LVOT diam:     2.10  cm   LV E/e' lateral: 16.7 LVOT Area:     3.46 cm  RIGHT VENTRICLE            IVC RV S prime:     8.49 cm/s  IVC diam: 2.60 cm TAPSE (M-mode): 1.2 cm LEFT ATRIUM             Index        RIGHT ATRIUM           Index LA diam:        4.70 cm 2.96 cm/m   RA Area:     21.50 cm LA Vol (A2C):   67.2 ml 42.29 ml/m  RA Volume:   57.90 ml  36.44 ml/m LA Vol (A4C):   88.0 ml 55.38 ml/m LA Biplane Vol: 78.6 ml 49.47 ml/m   AORTA Ao Root diam: 3.30 cm Ao Asc diam:  3.50 cm MITRAL VALVE                TRICUSPID VALVE MV Area (PHT): 3.94 cm     TR Peak grad:   16.0 mmHg MV Peak grad:  5.5 mmHg     TR Mean grad:   12.0 mmHg MV Mean grad:  2.0 mmHg     TR Vmax:        200.00 cm/s MV Vmax:       1.17 m/s     TR Vmean:       166.0 cm/s MV Vmean:      72.8 cm/s MV Decel Time: 193 msec     SHUNTS MV E velocity: 103.50 cm/s  Systemic Diam: 2.10 cm Oswaldo Milian MD Electronically signed by Oswaldo Milian MD Signature Date/Time: 05/02/2022/10:31:01 AM    Final    CT ANGIO HEAD NECK W WO CM (CODE STROKE)  Result Date: 05/01/2022 CLINICAL DATA:  Stroke follow-up EXAM: CT ANGIOGRAPHY HEAD AND NECK TECHNIQUE: Multidetector CT imaging of the head and neck was performed using the standard protocol during bolus administration of intravenous contrast. Multiplanar CT image reconstructions and MIPs were obtained to evaluate the vascular anatomy. Carotid stenosis measurements (when applicable) are obtained utilizing NASCET criteria, using the distal internal carotid diameter as the denominator. RADIATION DOSE REDUCTION: This exam was performed according to the departmental dose-optimization program which includes automated exposure control, adjustment of the mA and/or kV according to patient  size and/or use of iterative reconstruction technique. CONTRAST:  56m OMNIPAQUE IOHEXOL 350 MG/ML SOLN COMPARISON:  None Available. FINDINGS: CT HEAD FINDINGS Please see same-day CT brain for intracranial findings. Review of the MIP  images confirms the above findings CTA NECK FINDINGS Aortic arch: Standard branching. Imaged portion shows no evidence of aneurysm or dissection. No significant stenosis of the major arch vessel origins. Right carotid system: No evidence of dissection, stenosis (50% or greater), or occlusion. Left carotid system: No evidence of dissection, stenosis (50% or greater), or occlusion. Vertebral arteries: Codominant. No evidence of dissection, stenosis (50% or greater), or occlusion. Skeleton: Negative. Other neck: Negative. Upper chest: Small left pleural effusion. Partially visualized cardiac device in place. Review of the MIP images confirms the above findings CTA HEAD FINDINGS Anterior circulation: There is thrombus of the right M1/M2 junction with associated occlusion and nearly absent arborization in the right MCA territory. There is thrombus in an acute occlusion in the distal A2 segment of the right ACA (series 7, image 58). Posterior circulation: There is a fetal PCA on the right with the P2 segment supplied by the right PCOM. The P2 segment of the right PCA is focally occluded (series 7, image 104). Venous sinuses: As permitted by contrast timing, patent. Anatomic variants: Fetal PCA on the right. Review of the MIP images confirms the above findings IMPRESSION: 1. Acute occlusion at the right M1/M2 junction with nearly absent arborization in the right MCA territory. 2. Thrombus and acute occlusion of the distal A2 segment of the right ACA. 3. Focal occlusion of the P2 segment of the right PCA. 4. No hemodynamically significant stenosis in the neck. 5. Small bilateral pleural effusions. Findings were discussed with Dr. LCheral Markeron 05/01/22 at 2:44 PM. Electronically Signed   By: HMarin RobertsM.D.   On: 05/01/2022 15:02   CT HEAD CODE STROKE WO CONTRAST  Result Date: 05/01/2022 CLINICAL DATA:  Code stroke.  No history provided. EXAM: CT HEAD WITHOUT CONTRAST TECHNIQUE: Contiguous axial images were obtained  from the base of the skull through the vertex without intravenous contrast. RADIATION DOSE REDUCTION: This exam was performed according to the departmental dose-optimization program which includes automated exposure control, adjustment of the mA and/or kV according to patient size and/or use of iterative reconstruction technique. COMPARISON:  CT Brain 06/09/19 FINDINGS: Brain: Age-indeterminate, possibly acute infarct in the right cerebellum. No hemorrhage. No hydrocephalus. No extra-axial fluid collection. Vascular: Dense atherosclerotic calcifications involving the bilateral V4 segments of the vertebral arteries. Skull: Normal. Negative for fracture or focal lesion. Sinuses/Orbits: No acute finding. Other: None. ASPECTS [facility] Early CT Score[facility] 10 IMPRESSION: Age-indeterminate, possibly acute infarct in the right cerebellum. No hemorrhage. Findings were paged to Dr. LCheral Markeron 05/01/22 at 2:26 PM [facility]. Electronically Signed   By: HMarin RobertsM.D.   On: 05/01/2022 14:27    Labs:  CBC: Recent Labs    11/18/21 1220 05/01/22 1435 05/02/22 0619  WBC 7.0 SPECIMEN CLOTTED 11.9*  HGB 13.9 SPECIMEN CLOTTED 12.8  HCT 41.7 SPECIMEN CLOTTED 37.6  PLT 136.0* SPECIMEN CLOTTED 160    COAGS: Recent Labs    05/01/22 1435  INR SPECIMEN CLOTTED    BMP: Recent Labs    11/18/21 1220 05/01/22 1650 05/02/22 0619  NA 140 138 141  K 3.9 2.7* 3.6  CL 100 101 105  CO2 _0 GLUCOSE 176* 198* 163*  BUN 24* 12 12  CALCIUM 9.2 8.3* 8.1*  CREATININE 0.90  0.82 0.86  GFRNONAA  --  >60 >60    LIVER FUNCTION TESTS: Recent Labs    11/18/21 1220 05/01/22 1650  BILITOT 1.4* 1.3*  AST 20 25  ALT 13 18  ALKPHOS 54 47  PROT 6.4 6.1*  ALBUMIN 4.0 3.3*    Assessment and Plan:  1 day s/p CVA with IR mechanical thrombectomy x2 without complication --clinically doing well and regaining some function--now with corrected visual deficit and can cross midline, now can  do some purposeful movement with left side, albeit somewhat weak --CT expected today --further management per Neuro  NIR available as needed  Electronically Signed: Pasty Spillers, PA 05/02/2022, 1:10 PM   I spent a total of 25 Minutes at the the patient's bedside AND on the patient's hospital floor or unit, greater than 50% of which was counseling/coordinating care for post CVA management.

## 2022-05-02 NOTE — Progress Notes (Signed)
OT Cancellation Note  Patient Details Name: Kara [name] MRN: [id number] DOB: 18-Sep-1922   Cancelled Treatment:    Reason Eval/Treat Not Completed: Active bedrest order (Will assess when activity orders updated)  Mahli Glahn,HILLARY 05/02/2022, 7:57 AM Maurie Boettcher, OT/L   Acute OT Clinical Specialist Acute Rehabilitation Services Pager [phone] Office [phone]

## 2022-05-02 NOTE — Progress Notes (Signed)
  Echocardiogram 2D Echocardiogram has been performed.  Kara [name] 05/02/2022, 9:20 AM

## 2022-05-02 NOTE — Progress Notes (Signed)
  Transition of Care [facility]) Screening Note   Patient Details  Name: Kara [name] Date of Birth: October 28, 1922   Transition of Care [facility]) CM/SW Contact:    Benard Halsted, LCSW Phone Number: 05/02/2022, 9:54 AM    Transition of Care Department [facility]) has reviewed patient who reside at [place] at the [facility] (they do have a SNF side). We will continue to monitor patient advancement through interdisciplinary progression rounds. If new patient transition needs arise, please place a TOC consult.

## 2022-05-02 NOTE — Evaluation (Signed)
Clinical/Bedside Swallow Evaluation Patient Details  Name: Kara [name] MRN: [id number] Date of Birth: 1922-11-17  Today's Date: 05/02/2022 Time: SLP Start Time (ACUTE ONLY): 1100 SLP Stop Time (ACUTE ONLY): 1120 SLP Time Calculation (min) (ACUTE ONLY): 20 min  Past Medical History:  Past Medical History:  Diagnosis Date   Allergy    Atrial fibrillation (HCC)    CAD (coronary artery disease)    Cancer (HCC)    UTERINE   Chronic anticoagulation    Chronic diarrhea    Closed compression fracture of L2 lumbar vertebra, initial encounter ([place]) 11/08/2020   By CT scan done to evaluate severe persistent low back pain:  Impression below also notes spinal compression from bulging disk.     Superior endplate compression fracture of L2 with approximately 25% height loss and minimal, 2-3 mm retropulsion of the superior endplate.   Left-sided, nondisplaced zone 1 sacral insufficiency fracture.   Multilevel degenerative changes of the lumbar spine resulting    Closed intertrochanteric fracture of left femur ([place]) 08/22/2020   Decubitus ulcer    sacral region   Diabetes ([place])    diet controlled   Dysuria    E. coli UTI 11/21/2014   Edema of lower extremity    mainly right foot, slightly in left foot.   Facial fracture due to fall ([place]) 07/13/2020   Femur fracture, left ([place]) 06/09/2019   Fibrocystic breast disease    GERD (gastroesophageal reflux disease)    Glaucoma    Glaucoma    Hematuria    Hemorrhoids    History of colon polyps    History of pancreatitis    Hospital discharge follow-up 07/22/2020   Hyperlipidemia    Hypertension    Hypokalemia    IBS (irritable bowel syndrome)    Microscopic hematuria    Mitral valve regurgitation    Orbital fracture ([place]) 07/13/2020   Osteoarthritis    fingers   Pernicious anemia    Plantar fasciitis    Recurrent UTI    Skin cancer    Sleep apnea, obstructive    Umbilical hernia without obstruction and without gangrene 06/26/2015   Vaginal atrophy     Vitamin D deficiency    Yeast vaginitis    Past Surgical History:  Past Surgical History:  Procedure Laterality Date   ABDOMINAL HYSTERECTOMY  1980's   ABDOMINAL SURGERY     for villous polyp,,,many years ago   APPENDECTOMY  1940's   ASCAD, s/p PTCA  11/28/2005   MID LESION    BREAST BIOPSY Left 1970's   CARPAL TUNNEL RELEASE Right 12/13/2015   Procedure: CARPAL TUNNEL RELEASE;  Surgeon: Thornton Park, MD;  Location: [facility] ORS;  Service: Orthopedics;  Laterality: Right;   COLECTOMY  2015   INTRAMEDULLARY (IM) NAIL INTERTROCHANTERIC Left 06/11/2019   Procedure: INTRAMEDULLARY (IM) NAIL INTERTROCHANTRIC;  Surgeon: Earnestine Leys, MD;  Location: [facility] ORS;  Service: Orthopedics;  Laterality: Left;   IR CT HEAD LTD  05/01/2022   IR PERCUTANEOUS ART THROMBECTOMY/INFUSION INTRACRANIAL INC DIAG ANGIO  05/01/2022   IR US GUIDE VASC ACCESS RIGHT  05/01/2022   KYPHOPLASTY N/A 11/15/2020   Procedure: Hewitt Shorts;  Surgeon: Hessie Knows, MD;  Location: [facility] ORS;  Service: Orthopedics;  Laterality: N/A;   PACEMAKER PLACEMENT     radation     for uterine cance   REFRACTIVE SURGERY     for bilateral glaumoma   SACROPLASTY N/A 11/15/2020   Procedure: SACROPLASTY;  Surgeon: Hessie Knows, MD;  Location: [facility] ORS;  Service: Orthopedics;  Laterality: N/A;   TONSILLECTOMY  1936   HPI:  Pt is a 88 yo female presenting from ILF 12/14 with L facial droop, L weakness, L hemineglect, R gaze deviation, and dysarthria. 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 MRI pending. Pt s/p TNK and mechanical thrombectomy. PMH includes: GERD, fall 2 years ago sustaining small amount of ICH, afib, CAD, DM, glaucoma, pacemaker, HLD, HTN, anemia, and OSA    Assessment / Plan / Recommendation  Clinical Impression  Pt has sensory and motor impairment on  her L side, reporting no sensation to lower face. She has anterior loss of solids during bolus preparation on the left, of which she has no awareness. She has relatively improved oral clearance with no over residue or pocketing. No anterior loss or overt signs of aspiration are observed with thin liquids. Recommend starting with Dys 2 solids and thin liquids with careful monitoring. SLP Visit Diagnosis: Dysphagia, unspecified (R13.10)    Aspiration Risk  Mild aspiration risk;Moderate aspiration risk    Diet Recommendation Dysphagia 2 (Fine chop);Thin liquid   Liquid Administration via: Cup;Straw Medication Administration: Crushed with puree Supervision: Staff to assist with self feeding;Full supervision/cueing for compensatory strategies Compensations: Minimize environmental distractions;Slow rate;Small sips/bites;Monitor for anterior loss Postural Changes: Seated upright at 90 degrees    Other  Recommendations Oral Care Recommendations: Oral care BID    Recommendations for follow up therapy are one component of a multi-disciplinary discharge planning process, led by the attending physician.  Recommendations may be updated based on patient status, additional functional criteria and insurance authorization.  Follow up Recommendations  (tba)      Assistance Recommended at Discharge    Functional Status Assessment Patient has had a recent decline in their functional status and demonstrates the ability to make significant improvements in function in a reasonable and predictable amount of time.  Frequency and Duration min 2x/week  2 weeks       Prognosis Prognosis for Safe Diet Advancement: Good Barriers to Reach Goals: Cognitive deficits      Swallow Study   General HPI: Pt is a 88 yo female presenting from ILF 12/14 with L facial droop, L weakness, L hemineglect, R gaze deviation, and dysarthria. 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 CTA revealed acute occlusion at the R  M1/M2 junction, acute occlusion of the distal A2 segment of the R ACA, and focal occlusion of the P2 segment of the R PCA. MRI pending. Pt s/p TNK and mechanical thrombectomy. PMH includes: GERD, fall 2 years ago sustaining small amount of ICH, afib, CAD, DM, glaucoma, pacemaker, HLD, HTN, anemia, and OSA Type of Study: Bedside Swallow Evaluation Previous Swallow Assessment: none in chart Diet Prior to this Study: NPO Temperature Spikes Noted: No Respiratory Status: Nasal cannula History of Recent Intubation:  (for IR procedure only) Behavior/Cognition: Alert;Cooperative;Pleasant mood;Requires cueing Oral Cavity Assessment: Within Functional Limits Oral Care Completed by SLP: No Oral Cavity - Dentition: Adequate natural dentition;Missing dentition Vision: Impaired for self-feeding Self-Feeding Abilities: Needs assist Patient Positioning: Upright in bed Baseline Vocal Quality: Normal Volitional Cough: Strong Volitional Swallow: Able to elicit    Oral/Motor/Sensory Function Overall Oral Motor/Sensory Function: Moderate impairment Facial ROM: Reduced left;Suspected CN VII (facial) dysfunction Facial Symmetry: Abnormal symmetry left;Suspected CN VII (facial) dysfunction Facial Strength: Reduced left;Suspected CN  VII (facial) dysfunction Facial Sensation: Suspected CN V (Trigeminal) dysfunction;Reduced left Lingual ROM: Within Functional Limits Lingual Symmetry: Within Functional Limits Lingual Strength: Within Functional Limits Velum:  (difficult to visualize)   Ice Chips Ice chips: Within functional limits   Thin Liquid Thin Liquid: Within functional limits Presentation: Cup;Self Fed;Straw    Nectar Thick Nectar Thick Liquid: Not tested   Honey Thick Honey Thick Liquid: Not tested   Puree Puree: Impaired Presentation: Self Fed;Spoon Oral Phase Functional Implications: Left anterior spillage   Solid     Solid: Impaired Presentation: Self Fed Oral Phase Functional Implications:  Left anterior spillage      Osie Bond., M.A. [place] Office [phone]  Secure chat preferred  05/02/2022,12:18 PM

## 2022-05-02 NOTE — Progress Notes (Signed)
IP rehab admissions - per therapy recommendations patient screened for CIR.  At this time patient appears to be a potential candidate for CIR.  I will place a rehab consult per protocol for full assessment.  Please call me for questions.  [phone]

## 2022-05-03 DIAGNOSIS — I639 Cerebral infarction, unspecified: Secondary | ICD-10-CM | POA: Diagnosis not present

## 2022-05-03 LAB — GLUCOSE, CAPILLARY
Glucose-Capillary: 106 mg/dL — ABNORMAL HIGH (ref 70–99)
Glucose-Capillary: 121 mg/dL — ABNORMAL HIGH (ref 70–99)
Glucose-Capillary: 103 mg/dL — ABNORMAL HIGH (ref 70–99)
Glucose-Capillary: 128 mg/dL — ABNORMAL HIGH (ref 70–99)
Glucose-Capillary: 170 mg/dL — ABNORMAL HIGH (ref 70–99)

## 2022-05-03 LAB — CBC
HCT: 34.3 % — ABNORMAL LOW (ref 36.0–46.0)
Hemoglobin: 11.8 g/dL — ABNORMAL LOW (ref 12.0–15.0)
MCH: 33.5 pg (ref 26.0–34.0)
MCHC: 34.4 g/dL (ref 30.0–36.0)
MCV: 97.4 fL (ref 80.0–100.0)
Platelets: 124 10*3/uL — ABNORMAL LOW (ref 150–400)
RBC: 3.52 MIL/uL — ABNORMAL LOW (ref 3.87–5.11)
RDW: 13.4 % (ref 11.5–15.5)
WBC: 11.9 10*3/uL — ABNORMAL HIGH (ref 4.0–10.5)
nRBC: 0 % (ref 0.0–0.2)

## 2022-05-03 LAB — BASIC METABOLIC PANEL
Anion gap: 8 (ref 5–15)
BUN: 14 mg/dL (ref 8–23)
CO2: 28 mmol/L (ref 22–32)
Calcium: 8.3 mg/dL — ABNORMAL LOW (ref 8.9–10.3)
Chloride: 106 mmol/L (ref 98–111)
Creatinine, Ser: 0.9 mg/dL (ref 0.44–1.00)
GFR, Estimated: 58 mL/min — ABNORMAL LOW (ref >60)
Glucose, Bld: 107 mg/dL — ABNORMAL HIGH (ref 70–99)
Potassium: 3.4 mmol/L — ABNORMAL LOW (ref 3.5–5.1)
Sodium: 142 mmol/L (ref 135–145)

## 2022-05-03 LAB — HEMOGLOBIN A1C
Hgb A1c MFr Bld: 6.9 % — ABNORMAL HIGH (ref 4.8–5.6)
Mean Plasma Glucose: 151 mg/dL

## 2022-05-03 NOTE — Evaluation (Signed)
Occupational Therapy Evaluation Patient Details Name: Kara [name] MRN: [id number] DOB: 08-Aug-1922 Today's Date: 05/03/2022   History of Present Illness 88 y.o. female presents to [facility] hospital on 05/01/2022 with acute onset left facial droop, hemiplegia and hemineglect. CTA reveals occlusion at R M1/M2 junction, distal A2 segment of R ACA, and P2 segment of R PCA. Pt received TNK and underwent mechanical thrombectomy on 12/14. PMH includes Afib, CAD, uterine cancer, L2 compression fx, DM, glaucoma, HTN, HLD.   Clinical Impression   Pt from independent living where she typically ambulates with Rollator, has a PCA that assists with grocery shopping - but she does her own bathing/dressing and enjoys playing Bridge in her free time. Today Pt presents with L inattention, LUE impairment, visual deficits, decreased sensation, balance, activity tolerance. Pt is overall mod to max A for all aspects of ADL. Pt will benefit from skilled OT in the acute setting as well as post-acute at the inpatient level to maximize safety and independence in ADL and functional transfers and return to PLOF. Next session to continue to focus on OOB transfers and engaging in LUE in functional tasks as well as further assessment of vision.      Recommendations for follow up therapy are one component of a multi-disciplinary discharge planning process, led by the attending physician.  Recommendations may be updated based on patient status, additional functional criteria and insurance authorization.   Follow Up Recommendations  Acute inpatient rehab (3hours/day)     Assistance Recommended at Discharge Frequent or constant Supervision/Assistance  Patient can return home with the following Two people to help with walking and/or transfers;A lot of help with bathing/dressing/bathroom;Assistance with cooking/housework;Assistance with feeding;Direct supervision/assist for medications management;Direct supervision/assist for financial  management;Assist for transportation;Help with stairs or ramp for entrance    Functional Status Assessment  Patient has had a recent decline in their functional status and demonstrates the ability to make significant improvements in function in a reasonable and predictable amount of time.  Equipment Recommendations  Other (comment) (defer to next venue of care)    Recommendations for Other Services Rehab consult;PT consult;Speech consult     Precautions / Restrictions Precautions Precautions: Fall Precaution Comments: L inattention Restrictions Weight Bearing Restrictions: No      Mobility Bed Mobility Overal bed mobility: Needs Assistance Bed Mobility: Supine to Sit     Supine to sit: HOB elevated, Mod assist, +2 for physical assistance, +2 for safety/equipment     General bed mobility comments: assist with all aspects of mobility    Transfers Overall transfer level: Needs assistance Equipment used: 2 person hand held assist Transfers: Sit to/from Stand, Bed to chair/wheelchair/BSC Sit to Stand: +2 physical assistance, Mod assist Stand pivot transfers: +2 physical assistance, Mod assist         General transfer comment: bed>BSC>recliner transfer, pivot toward R      Balance Overall balance assessment: Needs assistance Sitting-balance support: Single extremity supported, Feet supported Sitting balance-Leahy Scale: Poor Sitting balance - Comments: strong left lateral lean, mod-maxA to correct. Pt is able to lean to right side with request, but without cues falls to left Postural control: Left lateral lean Standing balance support: Bilateral upper extremity supported, During functional activity Standing balance-Leahy Scale: Poor Standing balance comment: mod-maxA                           ADL either performed or assessed with clinical judgement   ADL Overall ADL's : Needs  assistance/impaired Eating/Feeding: Moderate assistance;Sitting;Cueing for  sequencing   Grooming: Wash/dry face;Brushing hair;Moderate assistance;Sitting Grooming Details (indicate cue type and reason): asisst for seated balance as well as grooming task completion Upper Body Bathing: Moderate assistance   Lower Body Bathing: Maximal assistance   Upper Body Dressing : Maximal assistance   Lower Body Dressing: Total assistance   Toilet Transfer: Moderate assistance;+2 for physical assistance;+2 for safety/equipment;Stand-pivot;BSC/3in1   Toileting- Clothing Manipulation and Hygiene: Maximal assistance;+2 for physical assistance;+2 for safety/equipment;Cueing for sequencing;Sit to/from stand Toileting - Clothing Manipulation Details (indicate cue type and reason): 3rd person completing peri care     Functional mobility during ADLs: Moderate assistance;+2 for physical assistance;+2 for safety/equipment;Cueing for sequencing;Cueing for safety General ADL Comments: LUE not using functionally, did not use at all during session - but would reach across midline using RUE to lift and move and interact with LUE     Vision Baseline Vision/History: 1 Wears glasses Ability to See in Adequate Light: 1 Impaired Vision Assessment?: Vision impaired- to be further tested in functional context;Yes Eye Alignment: Within Functional Limits Ocular Range of Motion: Impaired-to be further tested in functional context Alignment/Gaze Preference: Gaze right Tracking/Visual Pursuits: Decreased smoothness of horizontal tracking;Requires cues, head turns, or add eye shifts to track;Impaired - to be further tested in functional context Additional Comments: need to continue to test, would track past midline - unable to maintain, L inattention continues, but able to look at items on L with cues     Perception     Praxis      Pertinent Vitals/Pain Pain Assessment Faces Pain Scale: No hurt     Hand Dominance Right   Extremity/Trunk Assessment Upper Extremity Assessment Upper  Extremity Assessment: LUE deficits/detail LUE Deficits / Details: did not actively move LUE during session, initially stating she was able to sense touch on upper arm, but inconsistent. LUE Sensation: decreased light touch LUE Coordination: decreased fine motor;decreased gross motor   Lower Extremity Assessment Lower Extremity Assessment: Defer to PT evaluation   Cervical / Trunk Assessment Cervical / Trunk Assessment: Kyphotic (pt with tendency for right cervical rotation, can turn to left with cueing)   Communication Communication Communication: No difficulties   Cognition Arousal/Alertness: Awake/alert Behavior During Therapy: WFL for tasks assessed/performed Overall Cognitive Status: Impaired/Different from baseline Area of Impairment: Orientation, Attention, Memory, Following commands, Safety/judgement, Awareness, Problem solving                 Orientation Level: Disoriented to, Place, Time, Situation Current Attention Level: Sustained Memory: Decreased recall of precautions, Decreased short-term memory Following Commands: Follows one step commands with increased time Safety/Judgement: Decreased awareness of safety, Decreased awareness of deficits Awareness: Intellectual Problem Solving: Slow processing, Difficulty sequencing, Requires verbal cues General Comments: L inattention     General Comments  VSS on 2L, in and out of a fib    Exercises     Shoulder Instructions      Home Living Family/patient expects to be discharged to:: Other (Comment) (Independent Living, [place]) Living Arrangements: Alone Available Help at Discharge: Personal care attendant;Available PRN/intermittently Type of Home: Independent living facility Home Access: Level entry (elevator to get to 5th floor)     Home Layout: One level     Bathroom Shower/Tub: Occupational psychologist: Handicapped height Bathroom Accessibility: Yes How Accessible: Accessible via  wheelchair Home Equipment: [place] - single [place] (2 wheels);Rollator (4 wheels)   Additional Comments: likes to play Bridge card game  Prior Functioning/Environment Prior Level of Function : Needs assist             Mobility Comments: PCA assists with groceries and cleaning. Pt ambulates with rollator ADLs Comments: does her own        OT Problem List: Decreased strength;Decreased range of motion;Decreased activity tolerance;Impaired balance (sitting and/or standing);Impaired vision/perception;Decreased coordination;Decreased safety awareness;Decreased knowledge of use of DME or AE;Impaired sensation;Impaired UE functional use      OT Treatment/Interventions: Self-care/ADL training;Neuromuscular education;Therapeutic exercise;DME and/or AE instruction;Manual therapy;Therapeutic activities;Splinting;Visual/perceptual remediation/compensation;Patient/family education;Balance training    OT Goals(Current goals can be found in the care plan section) Acute Rehab OT Goals Patient Stated Goal: get back to playing Bridge with friends OT Goal Formulation: With patient Time For Goal Achievement: 05/17/22 Potential to Achieve Goals: Good ADL Goals Pt Will Perform Grooming: with supervision;sitting Pt Will Perform Upper Body Dressing: with supervision;sitting Pt Will Perform Lower Body Dressing: with mod assist;sit to/from stand Pt Will Transfer to Toilet: with mod assist;ambulating Pt Will Perform Toileting - Clothing Manipulation and hygiene: with mod assist;sit to/from stand Pt/caregiver will Perform Home Exercise Program: Left upper extremity;With minimal assist;With written HEP provided Additional ADL Goal #1: Pt will find items on the left 50% of the time using visual compensatory strategies during ADL completion  OT Frequency: Min 2X/week    Co-evaluation PT/OT/SLP Co-Evaluation/Treatment: Yes Reason for Co-Treatment: Complexity of the patient's impairments  (multi-system involvement);Necessary to address cognition/behavior during functional activity;For patient/therapist safety;To address functional/ADL transfers PT goals addressed during session: Mobility/safety with mobility;Balance;Proper use of DME;Strengthening/ROM OT goals addressed during session: ADL's and self-care;Proper use of Adaptive equipment and DME;Strengthening/ROM      AM-PAC OT "6 Clicks" Daily Activity     Outcome Measure Help from another person eating meals?: A Lot Help from another person taking care of personal grooming?: A Lot Help from another person toileting, which includes using toliet, bedpan, or urinal?: A Lot Help from another person bathing (including washing, rinsing, drying)?: A Lot Help from another person to put on and taking off regular upper body clothing?: A Lot Help from another person to put on and taking off regular lower body clothing?: A Lot 6 Click Score: 12   End of Session Equipment Utilized During Treatment: Gait belt;Rolling walker (2 wheels);Oxygen (2L) Nurse Communication: Mobility status;Precautions  Activity Tolerance: Patient tolerated treatment well Patient left: in chair;with call bell/phone within reach;with chair alarm set;with family/visitor present  OT Visit Diagnosis: Unsteadiness on feet (R26.81);Muscle weakness (generalized) (M62.81);Other symptoms and signs involving the nervous system (R29.898);Hemiplegia and hemiparesis Hemiplegia - Right/Left: Left Hemiplegia - dominant/non-dominant: Non-Dominant Hemiplegia - caused by: Cerebral infarction                Time: 1212-1239 OT Time Calculation (min): 27 min Charges:  OT General Charges $OT Visit: 1 Visit OT Evaluation $OT Eval Moderate Complexity: [place] OTR/L Acute Rehabilitation Services Office: [place] 05/03/2022, 2:05 PM

## 2022-05-03 NOTE — Progress Notes (Signed)
Physical Therapy Treatment Patient Details Name: Kara [name] MRN: [id number] DOB: [birth year] Today's Date: 05/03/2022   History of Present Illness 88 y.o. female presents to [facility] hospital on 05/01/2022 with acute onset left facial droop, hemiplegia and hemineglect. CTA reveals occlusion at R M1/M2 junction, distal A2 segment of R ACA, and P2 segment of R PCA. Pt received TNK and underwent mechanical thrombectomy on 12/14. PMH includes Afib, CAD, uterine cancer, L2 compression fx, DM, glaucoma, HTN, HLD.    PT Comments    Pt received in bed, requesting to use BSC for BM. She required max assist bed mobility, +2 mod assist sit to stand, and +2 mod assist SPT. Pt stood +2 mod assist x 1-2 minutes for pericare. Pt in recliner with feet elevated at end of session.   Recommendations for follow up therapy are one component of a multi-disciplinary discharge planning process, led by the attending physician.  Recommendations may be updated based on patient status, additional functional criteria and insurance authorization.  Follow Up Recommendations  Acute inpatient rehab (3hours/day)     Assistance Recommended at Discharge Frequent or constant Supervision/Assistance  Patient can return home with the following A lot of help with walking and/or transfers;A lot of help with bathing/dressing/bathroom   Equipment Recommendations  Wheelchair (measurements PT);Hospital bed    Recommendations for Other Services       Precautions / Restrictions Precautions Precautions: Fall Precaution Comments: L inattention     Mobility  Bed Mobility Overal bed mobility: Needs Assistance Bed Mobility: Supine to Sit     Supine to sit: Max assist, HOB elevated     General bed mobility comments: assist with all aspects of mobility    Transfers Overall transfer level: Needs assistance Equipment used: 2 person hand held assist Transfers: Sit to/from Stand, Bed to chair/wheelchair/BSC Sit to Stand: +2  physical assistance, Mod assist Stand pivot transfers: +2 physical assistance, Mod assist         General transfer comment: bed>BSC>recliner transfer, pivot toward R    Ambulation/Gait                   Stairs             Wheelchair Mobility    Modified Rankin (Stroke Patients Only) Modified Rankin (Stroke Patients Only) Pre-Morbid Rankin Score: Moderate disability Modified Rankin: Severe disability     Balance Overall balance assessment: Needs assistance Sitting-balance support: Single extremity supported, Feet supported Sitting balance-Leahy Scale: Poor   Postural control: Left lateral lean Standing balance support: Bilateral upper extremity supported, During functional activity Standing balance-Leahy Scale: Poor                              Cognition Arousal/Alertness: Awake/alert Behavior During Therapy: WFL for tasks assessed/performed Overall Cognitive Status: Impaired/Different from baseline Area of Impairment: Orientation, Attention, Memory, Following commands, Safety/judgement, Awareness, Problem solving                 Orientation Level: Disoriented to, Place, Time, Situation Current Attention Level: Sustained Memory: Decreased recall of precautions, Decreased short-term memory Following Commands: Follows one step commands with increased time Safety/Judgement: Decreased awareness of safety, Decreased awareness of deficits Awareness: Intellectual Problem Solving: Slow processing, Difficulty sequencing, Requires verbal cues General Comments: L inattention        Exercises      General Comments General comments (skin integrity, edema, etc.): VSS on 2L, in and out of a  fib      Pertinent Vitals/Pain Pain Assessment Pain Assessment: Faces Faces Pain Scale: No hurt    Home Living   Living Arrangements: Alone Available Help at Discharge: Personal care attendant;Available PRN/intermittently Type of Home: Independent  living facility Home Access: Level entry (elevator to get to 5th floor)       Home Layout: One level        Prior Function            PT Goals (current goals can now be found in the care plan section) Acute Rehab PT Goals Patient Stated Goal: to regain strength and mobility Progress towards PT goals: Progressing toward goals    Frequency    Min 4X/week      PT Plan Current plan remains appropriate    Co-evaluation PT/OT/SLP Co-Evaluation/Treatment: Yes Reason for Co-Treatment: To address functional/ADL transfers;For patient/therapist safety PT goals addressed during session: Mobility/safety with mobility;Balance        AM-PAC PT "6 Clicks" Mobility   Outcome Measure  Help needed turning from your back to your side while in a flat bed without using bedrails?: A Lot Help needed moving from lying on your back to sitting on the side of a flat bed without using bedrails?: A Lot Help needed moving to and from a bed to a chair (including a wheelchair)?: Total Help needed standing up from a chair using your arms (e.g., wheelchair or bedside chair)?: Total Help needed to walk in hospital room?: Total Help needed climbing 3-5 steps with a railing? : Total 6 Click Score: 8    End of Session Equipment Utilized During Treatment: Gait belt Activity Tolerance: Patient tolerated treatment well Patient left: in chair;with call bell/phone within reach;with chair alarm set;with family/visitor present (maximove sling in recliner) Nurse Communication: Mobility status;Need for lift equipment PT Visit Diagnosis: Other abnormalities of gait and mobility (R26.89);Other symptoms and signs involving the nervous system (R29.898);Muscle weakness (generalized) (M62.81);Hemiplegia and hemiparesis Hemiplegia - Right/Left: Left Hemiplegia - dominant/non-dominant: Non-dominant Hemiplegia - caused by: Cerebral infarction     Time: 1212-1239 PT Time Calculation (min) (ACUTE ONLY): 27  min  Charges:  $Therapeutic Activity: 8-22 mins                     Lorrin Goodell, PT  Office # [phone] Pager [phone]    Lorriane Shire 05/03/2022, 1:05 PM

## 2022-05-03 NOTE — Progress Notes (Addendum)
STROKE TEAM PROGRESS NOTE   INTERVAL HISTORY More conversational today,  Move out of ICU today No family is at the bedside. She is awake and has a right gaze preference.   Repeat CT is stable with large right middle cerebral artery infarct with  involvement of occipital lobe to suggest PCA involvement too.  Blood pressure adequately controlled.  Vital signs stable.  Neurological exam unchanged..  Vitals:   05/03/22 0600 05/03/22 0630 05/03/22 0700 05/03/22 0800  BP: (!) 133/58 (!) 134/54 (!) 147/65 (!) 138/52  Pulse: 74 76 80 72  Resp: (!) 24 (!) '22 20 19  '$ Temp:    97.8 F (36.6 C)  TempSrc:    Axillary  SpO2: 97% 98% 98% 97%  Weight:      Height:       CBC:  Recent Labs  Lab 05/01/22 1435 05/02/22 0619 05/03/22 0339  WBC SPECIMEN CLOTTED 11.9* 11.9*  NEUTROABS SPECIMEN CLOTTED  --   --   HGB SPECIMEN CLOTTED 12.8 11.8*  HCT SPECIMEN CLOTTED 37.6 34.3*  MCV SPECIMEN CLOTTED 96.4 97.4  PLT SPECIMEN CLOTTED 160 124*    Basic Metabolic Panel:  Recent Labs  Lab 05/02/22 0619 05/03/22 0339  NA 141 142  K 3.6 3.4*  CL 105 106  CO2 23 28  GLUCOSE 163* 107*  BUN 12 14  CREATININE 0.86 0.90  CALCIUM 8.1* 8.3*    Lipid Panel:  Recent Labs  Lab 05/02/22 0619  CHOL 122  TRIG 114  HDL 41  CHOLHDL 3.0  VLDL 23  LDLCALC 58   HgbA1c:  Recent Labs  Lab 05/02/22 0619  HGBA1C 6.9*   Urine Drug Screen: No results for input(s): "LABOPIA", "COCAINSCRNUR", "LABBENZ", "AMPHETMU", "THCU", "LABBARB" in the last 168 hours.  Alcohol Level  Recent Labs  Lab 05/01/22 1437  ETH <10     IMAGING past 24 hours CT HEAD WO CONTRAST (5MM)  Result Date: 05/02/2022 CLINICAL DATA:  Stroke follow-up EXAM: CT HEAD WITHOUT CONTRAST TECHNIQUE: Contiguous axial images were obtained from the base of the skull through the vertex without intravenous contrast. RADIATION DOSE REDUCTION: This exam was performed according to the departmental dose-optimization program which includes  automated exposure control, adjustment of the mA and/or kV according to patient size and/or use of iterative reconstruction technique. COMPARISON:  None Available. FINDINGS: Brain: Early cytotoxic edema throughout most of the right MCA territory. No acute hemorrhage. No midline shift. Generalized atrophy with findings chronic microvascular ischemia Vascular: No abnormal hyperdensity of the major intracranial arteries or dural venous sinuses. No intracranial atherosclerosis. Skull: The visualized skull base, calvarium and extracranial soft tissues are normal. Sinuses/Orbits: No fluid levels or advanced mucosal thickening of the visualized paranasal sinuses. No mastoid or middle ear effusion. The orbits are normal. IMPRESSION: 1. Early cytotoxic edema throughout most of the right MCA territory, consistent with early subacute infarct. 2. No acute hemorrhage or midline shift. Electronically Signed   By: Ulyses Jarred M.D.   On: 05/02/2022 19:49   ECHOCARDIOGRAM COMPLETE  Result Date: 05/02/2022    ECHOCARDIOGRAM REPORT   Patient Name:   DEANE MELICK Date of Exam: 05/02/2022 Medical Rec #:  [id number]     Height:       61.0 in Accession #:    [id number]    Weight:       133.2 lb Date of Birth:  Feb 08, 1923      BSA:          1.589 m Patient Age:  88 years      BP:           128/73 mmHg Patient Gender: F             HR:           103 bpm. Exam Location:  Inpatient Procedure: 2D Echo, Cardiac Doppler, Color Doppler and Intracardiac            Opacification Agent Indications:    Stroke  History:        Patient has prior history of Echocardiogram examinations, most                 recent 11/26/2021. CAD, Pacemaker, Arrythmias:Atrial                 Fibrillation; Risk Factors:Diabetes, Hypertension, Dyslipidemia                 and Sleep Apnea. Anemia.  Sonographer:    Eartha Inch Referring Phys: [phone] ERIC LINDZEN  Sonographer Comments: Technically difficult study due to poor echo windows. Image acquisition  challenging due to patient body habitus and Image acquisition challenging due to respiratory motion. IMPRESSIONS  1. Left ventricular ejection fraction, by estimation, is 60 to 65%. The left ventricle has normal function. The left ventricle has no regional wall motion abnormalities. There is mild left ventricular hypertrophy. Left ventricular diastolic parameters are indeterminate.  2. Right ventricular systolic function is mildly reduced. The right ventricular size is normal. There is normal pulmonary artery systolic pressure. The estimated right ventricular systolic pressure is 95.0 mmHg.  3. Left atrial size was severely dilated.  4. Right atrial size was mildly dilated.  5. The mitral valve is abnormal. Mild mitral valve regurgitation. No evidence of mitral stenosis. Moderate mitral annular calcification.  6. Tricuspid valve regurgitation is moderate to severe. Visually TR appears moderate, but does appear to be systolic flow reversal on hepatic vein doppler suggesting possible severe TR  7. The aortic valve is tricuspid. Aortic valve regurgitation is not visualized. Aortic valve sclerosis/calcification is present, without any evidence of aortic stenosis.  8. The inferior vena cava is dilated in size with <50% respiratory variability, suggesting right atrial pressure of 15 mmHg. FINDINGS  Left Ventricle: Left ventricular ejection fraction, by estimation, is 60 to 65%. The left ventricle has normal function. The left ventricle has no regional wall motion abnormalities. Definity contrast agent was given IV to delineate the left ventricular  endocardial borders. The left ventricular internal cavity size was normal in size. There is mild left ventricular hypertrophy. Left ventricular diastolic parameters are indeterminate. Right Ventricle: The right ventricular size is normal. No increase in right ventricular wall thickness. Right ventricular systolic function is mildly reduced. There is normal pulmonary artery  systolic pressure. The tricuspid regurgitant velocity is 2.00 m/s, and with an assumed right atrial pressure of 15 mmHg, the estimated right ventricular systolic pressure is 93.2 mmHg. Left Atrium: Left atrial size was severely dilated. Right Atrium: Right atrial size was mildly dilated. Pericardium: There is no evidence of pericardial effusion. Mitral Valve: The mitral valve is abnormal. Moderate mitral annular calcification. Mild mitral valve regurgitation. No evidence of mitral valve stenosis. MV peak gradient, 5.5 mmHg. The mean mitral valve gradient is 2.0 mmHg. Tricuspid Valve: The tricuspid valve is normal in structure. Tricuspid valve regurgitation is moderate. Aortic Valve: The aortic valve is tricuspid. Aortic valve regurgitation is not visualized. Aortic valve sclerosis/calcification is present, without any evidence of aortic stenosis. Pulmonic Valve: The pulmonic  valve was grossly normal. Pulmonic valve regurgitation is trivial. Aorta: The aortic root and ascending aorta are structurally normal, with no evidence of dilitation. Venous: The inferior vena cava is dilated in size with less than 50% respiratory variability, suggesting right atrial pressure of 15 mmHg. IAS/Shunts: The interatrial septum was not well visualized.  LEFT VENTRICLE PLAX 2D LVIDd:         4.20 cm   Diastology LVIDs:         2.60 cm   LV e' medial:    5.87 cm/s LV PW:         1.30 cm   LV E/e' medial:  17.6 LV IVS:        1.10 cm   LV e' lateral:   6.20 cm/s LVOT diam:     2.10 cm   LV E/e' lateral: 16.7 LVOT Area:     3.46 cm  RIGHT VENTRICLE            IVC RV S prime:     8.49 cm/s  IVC diam: 2.60 cm TAPSE (M-mode): 1.2 cm LEFT ATRIUM             Index        RIGHT ATRIUM           Index LA diam:        4.70 cm 2.96 cm/m   RA Area:     21.50 cm LA Vol (A2C):   67.2 ml 42.29 ml/m  RA Volume:   57.90 ml  36.44 ml/m LA Vol (A4C):   88.0 ml 55.38 ml/m LA Biplane Vol: 78.6 ml 49.47 ml/m   AORTA Ao Root diam: 3.30 cm Ao Asc diam:   3.50 cm MITRAL VALVE                TRICUSPID VALVE MV Area (PHT): 3.94 cm     TR Peak grad:   16.0 mmHg MV Peak grad:  5.5 mmHg     TR Mean grad:   12.0 mmHg MV Mean grad:  2.0 mmHg     TR Vmax:        200.00 cm/s MV Vmax:       1.17 m/s     TR Vmean:       166.0 cm/s MV Vmean:      72.8 cm/s MV Decel Time: 193 msec     SHUNTS MV E velocity: 103.50 cm/s  Systemic Diam: 2.10 cm Oswaldo Milian MD Electronically signed by Oswaldo Milian MD Signature Date/Time: 05/02/2022/10:31:01 AM    Final     PHYSICAL EXAM  Physical Exam  Constitutional: Appears well-developed and well-nourished.   Cardiovascular: Normal rate and regular rhythm.  Respiratory: Effort normal, non-labored breathing  Neuro: Mental Status: Patient is awake, alert, oriented to place, time, age, DOB but not to situation. She has no aphasia, fluent in language but mild to moderate dysarthria.  Follows all simple commands  Cranial Nerves: II: Visual Fields are full. Pupils are equal, round, and reactive to light.   III,IV, VI: Nystagmus with leftward gaze, but able to cross midline V: Facial sensation is symmetric to temperature VII: Facial movement  VIII: Hearing is intact to voice X: Palate elevates symmetrically XI: Shoulder shrug is symmetric. XII: Tongue protrudes midline without atrophy or fasciculations.  Motor: Tone is normal. Bulk is normal.  RUE 4/5 LUE 2/5 RLE 3/5 LLE 3/5 Sensory: Sensation decreased on the left upper extremity and diminished on the left lower extremity.  Cerebellar: FNF intact on the right    ASSESSMENT/PLAN [name] is a 88 y.o. female with history of atrial fibrillation, CAD, DM, glaucoma, pacemaker, HLD, HTN, anemia, and OSA presenting from her assisted living facility with an acute onset of left facial droop, left hemiplegia, left hemi neglect, right gaze deviation, and dysarthria. Baseline MRS is a 2. TNK given and taken for an emergent mechanical thrombectomy.  Of  note she did have a fall 2 years ago which resulted in a small ICH, she has not been on Coumadin since then.  Stroke: Right brain infarct due to right MCA, A2 and P2 occlusion s/p TNK and IR with TICI3, etiology likely due to Afib not on [facility] Code Stroke CT head Age-indeterminate, possibly acute infarct in the right cerebellum. No hemorrhage. CTA head & neck Acute occlusion at the right M1/M2 junction with nearly absent arborization in the right MCA territory. Thrombus and acute occlusion of the distal A2 segment of the right ACA. Focal occlusion of the P2 segment of the right PCA.  MRI - pacer not compatible with MRI Repeat CT Head at 24h of TNK - large right MCA infarct without overt bleeding 2D Echo EF is 60-65% LDL 23 HgbA1c 6.8 VTE prophylaxis - heparin subq No antithrombotic prior to admission, now on ASA '325mg'$ . Consider anticoagulation in 3-5 days given large stroke size.  Therapy recommendations: Acute inpatient rehab Disposition: Pending  Atrial fibrillation Not on anticoagulation Rate control: Toprol XL '25mg'$  BID? Metoprolol tartrate 12.5 BID  Rate controlled now on ASA '325mg'$ . Consider anticoagulation in 3-5 days given large stroke size.   Hypertension Home meds: Metoprolol Stable Blood pressure goal 120-140 24 hours post IR IV Cleviprex infusing  Diabetes type II Controlled Home meds: None HgbA1c 6.8, goal < 7.0 CBGs SSI  Dysphagia post stroke SLP on board Currently recommending dysphagia 2 with thins  Other Stroke Risk Factors Advanced Age >/= 70  OSA CAD Hx stroke/TIA History of ICH post fall  Other Active Problems Back pain- Home meds tramadol 50 mg every 12, lidocaine patch Pacemaker in place  Hospital day # 2  Patient seen and examined by NP/APP with MD. MD to update note as needed.   Janine Ores, DNP, FNP-BC [facility] Pager: [phone]  I have personally obtained history,examined this patient, reviewed notes, independently  viewed imaging studies, participated in medical decision making and plan of care.ROS completed by me personally and pertinent positives fully documented  I have made any additions or clarifications directly to the above note. Agree with note above.  Patient making slow neurological improvement.  Recommend continue ongoing therapies.  Aspirin for stroke prevention for now and consider changing to anticoagulation in 5 days.  Mobilize out of bed.  Transfer to neurology stepdown bed.  No family available at the bedside for discussion.This patient is critically ill and at significant risk of neurological worsening, death and care requires constant monitoring of vital signs, hemodynamics,respiratory and cardiac monitoring, extensive review of multiple databases, frequent neurological assessment, discussion with family, other specialists and medical decision making of high complexity.I have made any additions or clarifications directly to the above note.This critical care time does not reflect procedure time, or teaching time or supervisory time of PA/NP/Med Resident etc but could involve care discussion time.  I spent 30 minutes of neurocritical care time  in the care of  this patient.      Antony Contras, MD Medical Director [facility] Pager: [phone] 05/03/2022 1:51  PM  To contact Stroke Continuity provider, please refer to [email or web address] After hours, contact General Neurology

## 2022-05-03 NOTE — PMR Pre-admission (Signed)
PMR Admission Coordinator Pre-Admission Assessment  Patient: Kara [name] is an 88 y.o., female MRN: [id number] DOB: Dec 05, 1922 Height: _0  (154.9 cm) Weight: 60.4 kg  Insurance Information HMO:     PPO:      PCP:      IPA:      80/20: yes     OTHER:  PRIMARY: Medicare A & B      Policy#: [id number]      Subscriber: patient CM Name:       Phone#:      Fax#:  Pre-Cert#:       Employer:  Benefits:  Phone #: verified eligibility via Flasher on 05/03/22     Name:  Eff. Date: Part A & B effective 09/16/89     Deduct: $1,600      Out of Pocket Max: NA      Life Max: NA CIR: 100% coverage      SNF: 100% coverage days 1-20, 80% coverage days 21-100 Outpatient: 80% coverage     Co-Pay: 20% Home Health: 100% coverage      Co-Pay:  DME: 80% coverage     Co-Pay: 20% Providers: pt' choce SECONDARY: BCBS Supplement      Policy#: [id number]     Phone#: [phone]  Financial Counselor:       Phone#:   The "Data Collection Information Summary" for patients in Inpatient Rehabilitation Facilities with attached "Privacy Act [place] Records" was provided and verbally reviewed with: {CHL IP Patient Family QQ:[id number]}  Emergency Contact Information Contact Information     Name Relation Home Work Mobile   [place]Gr[place] Daughter [phone]     Lamm,Woody Relative   [phone]   Huntley Estelle   [phone]       Current Medical History  Patient Admitting Diagnosis: CVA History of Present Illness: Pt is a 88 year old female with medical hx significant for: A-fib, CAD, DM, glaucoma, pacemaker, HTN, HLD, anemia, OSA. Pt presented to [facility] as Code Stroke d/t acute onset of left facial droop, left hemiplegia, left hemineglect, right gaze deviation and dysarthria. CT head showed age indeterminate, possible acute infarct in right cerebellum. Negative for hemorrhage. CTA head and neck showed acute occlusion at right M1/M2 junction, thrombus and acute occlusion of  distal A2 segment of right ACA, focal occlusion of P2 segment of right PCA. TNK administered. Pt underwent mechanical thrombectomy on 12/14. Repeat CT on 12/15 showed large right MCA infarct without overt bleeding. Therapy evaluations completed and CIR recommended d/t pt's deficits in functional mobility, dysphagia and inability to perform ADLs independently.  Complete NIHSS TOTAL: 13  Patient's medical record from [facility] has been reviewed by the rehabilitation admission coordinator and physician.  Past Medical History  Past Medical History:  Diagnosis Date   Allergy    Atrial fibrillation (HCC)    CAD (coronary artery disease)    Cancer (HCC)    UTERINE   Chronic anticoagulation    Chronic diarrhea    Closed compression fracture of L2 lumbar vertebra, initial encounter ([place]) 11/08/2020   By CT scan done to evaluate severe persistent low back pain:  Impression below also notes spinal compression from bulging disk.     Superior endplate compression fracture of L2 with approximately 25% height loss and minimal, 2-3 mm retropulsion of the superior endplate.   Left-sided, nondisplaced zone 1 sacral insufficiency fracture.   Multilevel degenerative changes of the lumbar spine resulting    Closed intertrochanteric fracture of left femur (  [place]) 08/22/2020   Decubitus ulcer    sacral region   Diabetes ([place])    diet controlled   Dysuria    E. coli UTI 11/21/2014   Edema of lower extremity    mainly right foot, slightly in left foot.   Facial fracture due to fall ([place]) 07/13/2020   Femur fracture, left ([place]) 06/09/2019   Fibrocystic breast disease    GERD (gastroesophageal reflux disease)    Glaucoma    Glaucoma    Hematuria    Hemorrhoids    History of colon polyps    History of pancreatitis    Hospital discharge follow-up 07/22/2020   Hyperlipidemia    Hypertension    Hypokalemia    IBS (irritable bowel syndrome)    Microscopic hematuria    Mitral valve regurgitation    Orbital  fracture ([place]) 07/13/2020   Osteoarthritis    fingers   Pernicious anemia    Plantar fasciitis    Recurrent UTI    Skin cancer    Sleep apnea, obstructive    Umbilical hernia without obstruction and without gangrene 06/26/2015   Vaginal atrophy    Vitamin D deficiency    Yeast vaginitis     Has the patient had major surgery during 100 days prior to admission? Yes  Family History   family history includes Cancer in her mother and sister; Coronary artery disease in her father; Diabetes in an other family member; Kidney disease in her father.  Current Medications  Current Facility-Administered Medications:    acetaminophen (TYLENOL) tablet 650 mg, 650 mg, Oral, Q4H PRN, 650 mg at 05/03/22 0834 **OR** acetaminophen (TYLENOL) 160 MG/5ML solution 650 mg, 650 mg, Per Tube, Q4H PRN **OR** acetaminophen (TYLENOL) suppository 650 mg, 650 mg, Rectal, Q4H PRN, Kerney Elbe, MD   aspirin EC tablet 325 mg, 325 mg, Oral, Daily, Rosalin Hawking, MD, 325 mg at 05/03/22 7169   Chlorhexidine Gluconate Cloth 2 % PADS 6 each, 6 each, Topical, Daily, Amie Portland, MD, 6 each at 05/03/22 0800   heparin injection 5,000 Units, 5,000 Units, Subcutaneous, Q8H, Rosalin Hawking, MD, 5,000 Units at 05/03/22 1700   insulin aspart (novoLOG) injection 0-9 Units, 0-9 Units, Subcutaneous, Q4H, Kerney Elbe, MD, 1 Units at 05/03/22 1700   lidocaine (LIDODERM) 5 % 1 patch, 1 patch, Transdermal, Daily PRN, Charlean Merl, Marcelino Scot, NP, 1 patch at 05/03/22 1231   metoprolol tartrate (LOPRESSOR) tablet 12.5 mg, 12.5 mg, Oral, Q12H, Shafer, Devon, NP, 12.5 mg at 05/03/22 6789   Oral care mouth rinse, 15 mL, Mouth Rinse, PRN, Amie Portland, MD   pantoprazole (PROTONIX) injection 40 mg, 40 mg, Intravenous, Q24H, Rosalin Hawking, MD, 40 mg at 05/02/22 2335   senna-docusate (Senokot-S) tablet 1 tablet, 1 tablet, Oral, QHS PRN, Kerney Elbe, MD   traMADol Veatrice Bourbon) tablet 50 mg, 50 mg, Oral, Q12H PRN, Janine Ores, NP  Patients Current Diet:  Diet  Order             DIET DYS 2 Room service appropriate? Yes with Assist; Fluid consistency: Thin  Diet effective now                   Precautions / Restrictions Precautions Precautions: Fall Precaution Comments: L inattention Restrictions Weight Bearing Restrictions: No   Has the patient had 2 or more falls or a fall with injury in the past year? No  Prior Activity Level Limited Community (1-2x/wk): doctor's appointments, hair, grocery store  Prior Functional Level Self Care: Did the patient need  help bathing, dressing, using the toilet or eating? Needed some help  Indoor Mobility: Did the patient need assistance with walking from room to room (with or without device)? Independent  Stairs: Did the patient need assistance with internal or external stairs (with or without device)? Unknown (pt uses elevator at residence)  Functional Cognition: Did the patient need help planning regular tasks such as shopping or remembering to take medications? Independent  Patient Information    Patient's Response To:     Home Assistive Devices / Equipment Home Equipment: Cane - single point, Conservation officer, nature (2 wheels), Rollator (4 wheels)  Prior Device Use: Indicate devices/aids used by the patient prior to current illness, exacerbation or injury? Walker  Current Functional Level Cognition  Arousal/Alertness: Awake/alert Overall Cognitive Status: Impaired/Different from baseline Current Attention Level: Sustained Orientation Level: Oriented X4 Following Commands: Follows one step commands with increased time Safety/Judgement: Decreased awareness of safety, Decreased awareness of deficits General Comments: L inattention Attention: Sustained Sustained Attention: Appears intact Memory: Impaired Memory Impairment: Decreased recall of new information Awareness: Impaired Awareness Impairment: Intellectual impairment, Emergent impairment, Anticipatory impairment Problem Solving:  Impaired Problem Solving Impairment: Functional basic (pertaining to L neglect) Safety/Judgment: Impaired Comments: very inattentive to her L side    Extremity Assessment (includes Sensation/Coordination)  Upper Extremity Assessment: LUE deficits/detail LUE Deficits / Details: did not actively move LUE during session, initially stating she was able to sense touch on upper arm, but inconsistent. LUE Sensation: decreased light touch LUE Coordination: decreased fine motor, decreased gross motor  Lower Extremity Assessment: Defer to PT evaluation LLE Deficits / Details: grossly 3/5 LLE Sensation: decreased light touch    ADLs  Overall ADL's : Needs assistance/impaired Eating/Feeding: Moderate assistance, Sitting, Cueing for sequencing Grooming: Wash/dry face, Brushing hair, Moderate assistance, Sitting Grooming Details (indicate cue type and reason): asisst for seated balance as well as grooming task completion Upper Body Bathing: Moderate assistance Lower Body Bathing: Maximal assistance Upper Body Dressing : Maximal assistance Lower Body Dressing: Total assistance Toilet Transfer: Moderate assistance, +2 for physical assistance, +2 for safety/equipment, Stand-pivot, BSC/3in1 Toileting- Clothing Manipulation and Hygiene: Maximal assistance, +2 for physical assistance, +2 for safety/equipment, Cueing for sequencing, Sit to/from stand Toileting - Clothing Manipulation Details (indicate cue type and reason): 3rd person completing peri care Functional mobility during ADLs: Moderate assistance, +2 for physical assistance, +2 for safety/equipment, Cueing for sequencing, Cueing for safety General ADL Comments: LUE not using functionally, did not use at all during session - but would reach across midline using RUE to lift and move and interact with LUE    Mobility  Overal bed mobility: Needs Assistance Bed Mobility: Supine to Sit Supine to sit: HOB elevated, Mod assist, +2 for physical  assistance, +2 for safety/equipment General bed mobility comments: assist with all aspects of mobility    Transfers  Overall transfer level: Needs assistance Equipment used: 2 person hand held assist Transfers: Sit to/from Stand, Bed to chair/wheelchair/BSC Sit to Stand: +2 physical assistance, Mod assist Bed to/from chair/wheelchair/BSC transfer type:: Stand pivot Stand pivot transfers: +2 physical assistance, Mod assist General transfer comment: bed>BSC>recliner transfer, pivot toward R    Ambulation / Gait / Stairs / Wheelchair Mobility       Posture / Balance Dynamic Sitting Balance Sitting balance - Comments: strong left lateral lean, mod-maxA to correct. Pt is able to lean to right side with request, but without cues falls to left Balance Overall balance assessment: Needs assistance Sitting-balance support: Single extremity supported, Feet supported Sitting  balance-Leahy Scale: Poor Sitting balance - Comments: strong left lateral lean, mod-maxA to correct. Pt is able to lean to right side with request, but without cues falls to left Postural control: Left lateral lean Standing balance support: Bilateral upper extremity supported, During functional activity Standing balance-Leahy Scale: Poor Standing balance comment: mod-maxA    Special needs/care consideration Oxygen 2L O2 via nasal cannula, Skin Puncture: groin/right; Erythema/Redness: groin, leg, sacrum/right, lower; Ecchymosis: arm/left, Bladder incontinence, External urinary catheter and Diabetic management novoLOG 0-9 units every 4 hours   Previous Home Environment (from acute therapy documentation) Living Arrangements: Alone  Lives With: Alone Available Help at Discharge: Personal care attendant, Available PRN/intermittently Type of Home: Independent living facility Home Layout: One level Home Access: Level entry (elevator to get to 5th floor) Bathroom Shower/Tub: Multimedia programmer: Handicapped  height Bathroom Accessibility: Yes How Accessible: Accessible via wheelchair Home Care Services: No Additional Comments: likes to play Bridge card game  Discharge Living Setting Plans for Discharge Living Setting: Patient's home ([place]) Type of Home at Discharge: [place] Name at Discharge: [facility] Discharge Home Layout: One level Discharge Home Access: Level entry Discharge Bathroom Shower/Tub: Walk-in shower Discharge Bathroom Toilet: Handicapped height Discharge Bathroom Accessibility: Yes How Accessible: Accessible via wheelchair Does the patient have any problems obtaining your medications?: No  Social/Family/Support Systems Anticipated Caregiver: Rosana Hoes, daughter; personcal care attendant, facilty staff Anticipated Caregiver's Contact Information: [name]: [phone] Does Caregiver/Family have Issues with Lodging/Transportation while Pt is in Rehab?: No  Goals Patient/Family Goal for Rehab: *** Expected length of stay: *** Pt/Family Agrees to Admission and willing to participate: Yes Program Orientation Provided & Reviewed with Pt/Caregiver Including Roles  & Responsibilities: Yes  Decrease burden of Care through IP rehab admission: NA  Possible need for SNF placement upon discharge: Not anticipated  Patient Condition: I have reviewed medical records from [facility], spoken with {CHL IP CSW ZW:[id number]}, and patient and daughter. I met with patient at the bedside for inpatient rehabilitation assessment.  Patient will benefit from ongoing PT, OT, and SLP, can actively participate in 3 hours of therapy a day 5 days of the week, and can make measurable gains during the admission.  Patient will also benefit from the coordinated team approach during an Inpatient Acute Rehabilitation admission.  The patient will receive intensive therapy as well as Rehabilitation physician, nursing, social worker, and care management  interventions.  Due to bladder management, safety, skin/wound care, disease management, medication administration, pain management, and patient education the patient requires 24 hour a day rehabilitation nursing.  The patient is currently *** with mobility and basic ADLs.  Discharge setting and therapy post discharge at [facility] IP discharge location:[id number]} is anticipated.  Patient has agreed to participate in the Acute Inpatient Rehabilitation Program and will admit {Time; today/tomorrow:10263}.  Preadmission Screen Completed By:  Bethel Born, 05/03/2022 6:08 PM ______________________________________________________________________   Discussed status with Dr. Marland Kitchen on *** at *** and received approval for admission today.  Admission Coordinator:  Bethel Born, CCC-SLP, time ***/Date ***   Assessment/Plan: Diagnosis: Does the need for close, 24 hr/day Medical supervision in concert with the patient's rehab needs make it unreasonable for this patient to be served in a less intensive setting? {yes_no_potentially:3041433} Co-Morbidities requiring supervision/potential complications: *** Due to {due UM:3536144}, does the patient require 24 hr/day rehab nursing? {yes_no_potentially:3041433} Does the patient require coordinated care of a physician, rehab nurse, PT, OT, and SLP to address physical and functional  deficits in the context of the above medical diagnosis(es)? {yes_no_potentially:3041433} Addressing deficits in the following areas: {deficits:3041436} Can the patient actively participate in an intensive therapy program of at least 3 hrs of therapy 5 days a week? {yes_no_potentially:3041433} The potential for patient to make measurable gains while on inpatient rehab is {potential:3041437} Anticipated functional outcomes upon discharge from inpatient rehab: {functional outcomes:304600100} PT, {functional outcomes:304600100} OT, {functional outcomes:304600100} SLP Estimated rehab  length of stay to reach the above functional goals is: *** Anticipated discharge destination: {anticipated dc setting:21604} 10. Overall Rehab/Functional Prognosis: {potential:3041437}   MD Signature: ***

## 2022-05-03 NOTE — Progress Notes (Signed)
Inpatient Rehab Admissions:  Inpatient Rehab Consult received.  I met with patient and daughter at the bedside for rehabilitation assessment and to discuss goals and expectations of an inpatient rehab admission.  Pt asleep so spoke with daughter. Discussed average length of stay, discharge home after completion of CIR. She acknowledged understanding. She would like pt to pursue CIR. Will continue to follow.  Signed:  Graves Madden, MS, CCC-SLP Admissions Coordinator [phone]   

## 2022-05-04 DIAGNOSIS — I639 Cerebral infarction, unspecified: Secondary | ICD-10-CM | POA: Diagnosis not present

## 2022-05-04 LAB — BASIC METABOLIC PANEL
Anion gap: 12 (ref 5–15)
BUN: 17 mg/dL (ref 8–23)
CO2: 27 mmol/L (ref 22–32)
Calcium: 8.4 mg/dL — ABNORMAL LOW (ref 8.9–10.3)
Chloride: 101 mmol/L (ref 98–111)
Creatinine, Ser: 0.84 mg/dL (ref 0.44–1.00)
GFR, Estimated: >60 mL/min (ref >60)
Glucose, Bld: 135 mg/dL — ABNORMAL HIGH (ref 70–99)
Potassium: 3.5 mmol/L (ref 3.5–5.1)
Sodium: 140 mmol/L (ref 135–145)

## 2022-05-04 LAB — CBC
HCT: 37.7 % (ref 36.0–46.0)
Hemoglobin: 12.6 g/dL (ref 12.0–15.0)
MCH: 32.4 pg (ref 26.0–34.0)
MCHC: 33.4 g/dL (ref 30.0–36.0)
MCV: 96.9 fL (ref 80.0–100.0)
Platelets: 125 10*3/uL — ABNORMAL LOW (ref 150–400)
RBC: 3.89 MIL/uL (ref 3.87–5.11)
RDW: 13.4 % (ref 11.5–15.5)
WBC: 10.3 10*3/uL (ref 4.0–10.5)
nRBC: 0 % (ref 0.0–0.2)

## 2022-05-04 LAB — GLUCOSE, CAPILLARY
Glucose-Capillary: 156 mg/dL — ABNORMAL HIGH (ref 70–99)
Glucose-Capillary: 109 mg/dL — ABNORMAL HIGH (ref 70–99)
Glucose-Capillary: 147 mg/dL — ABNORMAL HIGH (ref 70–99)

## 2022-05-04 MED ORDER — ESTRADIOL 0.1 MG/GM VA CREA: 1.0000 | TOPICAL_CREAM | Freq: Two times a day (BID) | VAGINAL | Status: DC

## 2022-05-04 MED ORDER — ZINC OXIDE 11.3 % EX CREA
TOPICAL_CREAM | Freq: Two times a day (BID) | CUTANEOUS | Status: DC
  Filled 2022-05-04: qty 56

## 2022-05-04 MED ORDER — INSULIN ASPART 100 UNIT/ML IJ SOLN
0.0000 [IU] | Freq: Three times a day (TID) | INTRAMUSCULAR | Status: DC
  Administered 2022-05-04 – 2022-05-05 (×3): 2 [IU] via SUBCUTANEOUS
  Administered 2022-05-05: 1 [IU] via SUBCUTANEOUS

## 2022-05-04 MED ORDER — ESTRADIOL 0.1 MG/GM VA CREA: 1.0000 | TOPICAL_CREAM | VAGINAL | Status: DC | PRN

## 2022-05-04 MED ORDER — TRIAMCINOLONE ACETONIDE 0.1 % EX CREA
TOPICAL_CREAM | Freq: Two times a day (BID) | CUTANEOUS | Status: DC
  Filled 2022-05-04: qty 15

## 2022-05-04 NOTE — Progress Notes (Addendum)
STROKE TEAM PROGRESS NOTE   INTERVAL HISTORY Awake and alert today. States that she had a slight headache today.  Consider starting Quesada 3-5 days   Awaiting CIR  Blood pressure adequately controlled.  Vital signs stable.  Neurological exam unchanged .  Daughter is at the bedside Vitals:   05/03/22 1800 05/03/22 2045 05/04/22 0053 05/04/22 0359  BP: (!) 150/48 (!) 158/74 (!) 155/70 (!) 159/85  Pulse: 78     Resp: (!) '21 20 20 18  '$ Temp:  97.9 F (36.6 C) 98.2 F (36.8 C) 98.1 F (36.7 C)  TempSrc:  Oral Oral Oral  SpO2: 97% 98% 95% 96%  Weight:      Height:       CBC:  Recent Labs  Lab 05/01/22 1435 05/02/22 0619 05/03/22 0339 05/04/22 0352  WBC SPECIMEN CLOTTED   < > 11.9* 10.3  NEUTROABS SPECIMEN CLOTTED  --   --   --   HGB SPECIMEN CLOTTED   < > 11.8* 12.6  HCT SPECIMEN CLOTTED   < > 34.3* 37.7  MCV SPECIMEN CLOTTED   < > 97.4 96.9  PLT SPECIMEN CLOTTED   < > 124* 125*   < > = values in this interval not displayed.    Basic Metabolic Panel:  Recent Labs  Lab 05/03/22 0339 05/04/22 0352  NA 142 140  K 3.4* 3.5  CL 106 101  CO2 28 27  GLUCOSE 107* 135*  BUN 14 17  CREATININE 0.90 0.84  CALCIUM 8.3* 8.4*    Lipid Panel:  Recent Labs  Lab 05/02/22 0619  CHOL 122  TRIG 114  HDL 41  CHOLHDL 3.0  VLDL 23  LDLCALC 58    HgbA1c:  Recent Labs  Lab 05/02/22 0619  HGBA1C 6.9*    Urine Drug Screen: No results for input(s): "LABOPIA", "COCAINSCRNUR", "LABBENZ", "AMPHETMU", "THCU", "LABBARB" in the last 168 hours.  Alcohol Level  Recent Labs  Lab 05/01/22 1437  ETH <10     IMAGING past 24 hours No results found.  PHYSICAL EXAM  Physical Exam  Constitutional: Appears well-developed and well-nourished.  Pleasant elderly Caucasian lady Cardiovascular: Normal rate and regular rhythm.  Respiratory: Effort normal, non-labored breathing  Neuro: Mental Status: Patient is awake, alert, oriented to place, time, age, DOB but not to situation. She has  no aphasia, fluent in language but mild to moderate dysarthria.  Follows all simple commands  Still has LUE neglect Cranial Nerves: II: Visual Fields are full. Pupils are equal, round, and reactive to light.   III,IV, VI: Nystagmus with leftward gaze, but able to cross midline V: Facial sensation is symmetric to temperature VII: Facial movement  VIII: Hearing is intact to voice X: Palate elevates symmetrically XI: Shoulder shrug is symmetric. XII: Tongue protrudes midline without atrophy or fasciculations.  Motor: Tone is normal. Bulk is normal.  RUE 4/5 LUE 2/5 RLE 3/5 LLE 3/5 Sensory: Sensation decreased on the left upper extremity and diminished on the left lower extremity.   Cerebellar: FNF intact on the right    ASSESSMENT/PLAN Kara [name] is a 88 y.o. female with history of atrial fibrillation, CAD, DM, glaucoma, pacemaker, HLD, HTN, anemia, and OSA presenting from her assisted living facility with an acute onset of left facial droop, left hemiplegia, left hemi neglect, right gaze deviation, and dysarthria. Baseline MRS is a 2. TNK given and taken for an emergent mechanical thrombectomy.  Of note she did have a fall 2 years ago which resulted in  a small ICH, she has not been on Coumadin since then.  Stroke: Right brain infarct due to right MCA, A2 and P2 occlusion s/p TNK and IR with TICI3, etiology likely due to Afib not on [facility] Code Stroke CT head Age-indeterminate, possibly acute infarct in the right cerebellum. No hemorrhage. CTA head & neck Acute occlusion at the right M1/M2 junction with nearly absent arborization in the right MCA territory. Thrombus and acute occlusion of the distal A2 segment of the right ACA. Focal occlusion of the P2 segment of the right PCA.  MRI - pacer not compatible with MRI Repeat CT Head at 24h of TNK - large right MCA infarct without overt bleeding 2D Echo EF is 60-65% LDL 23 HgbA1c 6.8 VTE prophylaxis - heparin subq No antithrombotic  prior to admission, now on ASA '325mg'$ . Consider anticoagulation in 3-5 days given large stroke size.  Therapy recommendations: Acute inpatient rehab Disposition: Pending  Atrial fibrillation Not on anticoagulation Rate control: Toprol XL '25mg'$  BID? Metoprolol tartrate 12.5 BID  Rate controlled now on ASA '325mg'$ . Consider anticoagulation in 3-5 days given large stroke size.   Hypertension Home meds: Metoprolol Stable Normotensive goal  Diabetes type II Controlled Home meds: None HgbA1c 6.8, goal < 7.0 CBGs SSI  Dysphagia post stroke SLP on board Currently recommending dysphagia 2 with thins  Other Stroke Risk Factors Advanced Age >/= 78  OSA CAD Hx stroke/TIA History of ICH post fall  Other Active Problems Back pain- Home meds tramadol 50 mg every 12, lidocaine patch Pacemaker in place  Hospital day # 3  Patient seen and examined by NP/APP with MD. MD to update note as needed.   Janine Ores, DNP, FNP-BC [facility] Pager: [phone]  I have personally obtained history,examined this patient, reviewed notes, independently viewed imaging studies, participated in medical decision making and plan of care.ROS completed by me personally and pertinent positives fully documented  I have made any additions or clarifications directly to the above note. Agree with note above.  Patient is doing well making gradual improvement.  Continue ongoing therapies and transfer to rehab the next few days.  Continue aspirin for now and will consider changing to Eliquis in 3 to 5 days if family willing.  Discussed with patient and daughter and answered questions.  Greater than 50% time during this 35-minute visit was spent on counseling and coordination of care about her stroke and discussion about risk benefit of anticoagulation and answering questions  Antony Contras, MD Medical Director [place] Pager: [phone] 05/04/2022 4:10 PM   To contact Stroke  Continuity provider, please refer to [email or web address] After hours, contact General Neurology

## 2022-05-05 ENCOUNTER — Inpatient Hospital Stay (HOSPITAL_COMMUNITY)
Admission: RE | Admit: 2022-05-05 | Discharge: 2022-05-22 | DRG: 057 | Disposition: A | Discharge status: Skilled Nursing Facility | Payer: Medicare Other | Source: Skilled Nursing Facility | Attending: Physical Medicine & Rehabilitation | Admitting: Physical Medicine & Rehabilitation

## 2022-05-05 ENCOUNTER — Encounter (HOSPITAL_COMMUNITY): Payer: Self-pay | Admitting: Physical Medicine & Rehabilitation

## 2022-05-05 ENCOUNTER — Other Ambulatory Visit (HOSPITAL_COMMUNITY): Payer: Self-pay

## 2022-05-05 ENCOUNTER — Other Ambulatory Visit: Payer: Self-pay

## 2022-05-05 DIAGNOSIS — N952 Postmenopausal atrophic vaginitis: Secondary | ICD-10-CM | POA: Diagnosis not present

## 2022-05-05 DIAGNOSIS — R41841 Cognitive communication deficit: Secondary | ICD-10-CM | POA: Diagnosis not present

## 2022-05-05 DIAGNOSIS — E1140 Type 2 diabetes mellitus with diabetic neuropathy, unspecified: Secondary | ICD-10-CM | POA: Diagnosis not present

## 2022-05-05 DIAGNOSIS — I700 Atherosclerosis of aorta: Secondary | ICD-10-CM | POA: Diagnosis not present

## 2022-05-05 DIAGNOSIS — R634 Abnormal weight loss: Secondary | ICD-10-CM | POA: Diagnosis not present

## 2022-05-05 DIAGNOSIS — B370 Candidal stomatitis: Secondary | ICD-10-CM | POA: Diagnosis not present

## 2022-05-05 DIAGNOSIS — Z7901 Long term (current) use of anticoagulants: Secondary | ICD-10-CM | POA: Diagnosis not present

## 2022-05-05 DIAGNOSIS — Z841 Family history of disorders of kidney and ureter: Secondary | ICD-10-CM

## 2022-05-05 DIAGNOSIS — Z87891 Personal history of nicotine dependence: Secondary | ICD-10-CM

## 2022-05-05 DIAGNOSIS — E1142 Type 2 diabetes mellitus with diabetic polyneuropathy: Secondary | ICD-10-CM | POA: Diagnosis present

## 2022-05-05 DIAGNOSIS — I63511 Cerebral infarction due to unspecified occlusion or stenosis of right middle cerebral artery: Secondary | ICD-10-CM | POA: Diagnosis not present

## 2022-05-05 DIAGNOSIS — M79605 Pain in left leg: Secondary | ICD-10-CM | POA: Diagnosis not present

## 2022-05-05 DIAGNOSIS — I739 Peripheral vascular disease, unspecified: Secondary | ICD-10-CM | POA: Diagnosis not present

## 2022-05-05 DIAGNOSIS — I69398 Other sequelae of cerebral infarction: Secondary | ICD-10-CM | POA: Diagnosis not present

## 2022-05-05 DIAGNOSIS — R269 Unspecified abnormalities of gait and mobility: Secondary | ICD-10-CM | POA: Diagnosis not present

## 2022-05-05 DIAGNOSIS — M6281 Muscle weakness (generalized): Secondary | ICD-10-CM | POA: Diagnosis not present

## 2022-05-05 DIAGNOSIS — E1169 Type 2 diabetes mellitus with other specified complication: Secondary | ICD-10-CM | POA: Diagnosis not present

## 2022-05-05 DIAGNOSIS — I69322 Dysarthria following cerebral infarction: Secondary | ICD-10-CM

## 2022-05-05 DIAGNOSIS — Z6821 Body mass index (BMI) 21.0-21.9, adult: Secondary | ICD-10-CM

## 2022-05-05 DIAGNOSIS — I69319 Unspecified symptoms and signs involving cognitive functions following cerebral infarction: Secondary | ICD-10-CM | POA: Diagnosis not present

## 2022-05-05 DIAGNOSIS — B379 Candidiasis, unspecified: Secondary | ICD-10-CM | POA: Diagnosis not present

## 2022-05-05 DIAGNOSIS — Z85828 Personal history of other malignant neoplasm of skin: Secondary | ICD-10-CM

## 2022-05-05 DIAGNOSIS — E119 Type 2 diabetes mellitus without complications: Secondary | ICD-10-CM | POA: Diagnosis present

## 2022-05-05 DIAGNOSIS — Z66 Do not resuscitate: Secondary | ICD-10-CM | POA: Diagnosis present

## 2022-05-05 DIAGNOSIS — M2550 Pain in unspecified joint: Secondary | ICD-10-CM | POA: Diagnosis not present

## 2022-05-05 DIAGNOSIS — K219 Gastro-esophageal reflux disease without esophagitis: Secondary | ICD-10-CM | POA: Diagnosis present

## 2022-05-05 DIAGNOSIS — I340 Nonrheumatic mitral (valve) insufficiency: Secondary | ICD-10-CM | POA: Diagnosis not present

## 2022-05-05 DIAGNOSIS — Z8249 Family history of ischemic heart disease and other diseases of the circulatory system: Secondary | ICD-10-CM

## 2022-05-05 DIAGNOSIS — F3340 Major depressive disorder, recurrent, in remission, unspecified: Secondary | ICD-10-CM | POA: Diagnosis not present

## 2022-05-05 DIAGNOSIS — L249 Irritant contact dermatitis, unspecified cause: Secondary | ICD-10-CM | POA: Diagnosis not present

## 2022-05-05 DIAGNOSIS — E782 Mixed hyperlipidemia: Secondary | ICD-10-CM | POA: Diagnosis not present

## 2022-05-05 DIAGNOSIS — R630 Anorexia: Secondary | ICD-10-CM | POA: Diagnosis not present

## 2022-05-05 DIAGNOSIS — R279 Unspecified lack of coordination: Secondary | ICD-10-CM | POA: Diagnosis not present

## 2022-05-05 DIAGNOSIS — I69391 Dysphagia following cerebral infarction: Secondary | ICD-10-CM | POA: Diagnosis not present

## 2022-05-05 DIAGNOSIS — I495 Sick sinus syndrome: Secondary | ICD-10-CM | POA: Diagnosis not present

## 2022-05-05 DIAGNOSIS — Z79899 Other long term (current) drug therapy: Secondary | ICD-10-CM

## 2022-05-05 DIAGNOSIS — Z950 Presence of cardiac pacemaker: Secondary | ICD-10-CM | POA: Diagnosis not present

## 2022-05-05 DIAGNOSIS — Z809 Family history of malignant neoplasm, unspecified: Secondary | ICD-10-CM

## 2022-05-05 DIAGNOSIS — F32A Depression, unspecified: Secondary | ICD-10-CM | POA: Diagnosis not present

## 2022-05-05 DIAGNOSIS — I071 Rheumatic tricuspid insufficiency: Secondary | ICD-10-CM | POA: Diagnosis not present

## 2022-05-05 DIAGNOSIS — I2510 Atherosclerotic heart disease of native coronary artery without angina pectoris: Secondary | ICD-10-CM | POA: Diagnosis not present

## 2022-05-05 DIAGNOSIS — I4819 Other persistent atrial fibrillation: Secondary | ICD-10-CM | POA: Diagnosis not present

## 2022-05-05 DIAGNOSIS — R410 Disorientation, unspecified: Secondary | ICD-10-CM | POA: Diagnosis not present

## 2022-05-05 DIAGNOSIS — E785 Hyperlipidemia, unspecified: Secondary | ICD-10-CM | POA: Diagnosis present

## 2022-05-05 DIAGNOSIS — M79604 Pain in right leg: Secondary | ICD-10-CM | POA: Diagnosis not present

## 2022-05-05 DIAGNOSIS — Z7401 Bed confinement status: Secondary | ICD-10-CM | POA: Diagnosis not present

## 2022-05-05 DIAGNOSIS — Z8542 Personal history of malignant neoplasm of other parts of uterus: Secondary | ICD-10-CM | POA: Diagnosis not present

## 2022-05-05 DIAGNOSIS — I272 Pulmonary hypertension, unspecified: Secondary | ICD-10-CM | POA: Diagnosis not present

## 2022-05-05 DIAGNOSIS — I69354 Hemiplegia and hemiparesis following cerebral infarction affecting left non-dominant side: Secondary | ICD-10-CM | POA: Diagnosis not present

## 2022-05-05 DIAGNOSIS — Z888 Allergy status to other drugs, medicaments and biological substances status: Secondary | ICD-10-CM

## 2022-05-05 DIAGNOSIS — B9620 Unspecified Escherichia coli [E. coli] as the cause of diseases classified elsewhere: Secondary | ICD-10-CM | POA: Diagnosis not present

## 2022-05-05 DIAGNOSIS — E876 Hypokalemia: Secondary | ICD-10-CM | POA: Diagnosis not present

## 2022-05-05 DIAGNOSIS — E440 Moderate protein-calorie malnutrition: Secondary | ICD-10-CM | POA: Diagnosis present

## 2022-05-05 DIAGNOSIS — R5381 Other malaise: Secondary | ICD-10-CM | POA: Diagnosis not present

## 2022-05-05 DIAGNOSIS — I10 Essential (primary) hypertension: Secondary | ICD-10-CM | POA: Diagnosis not present

## 2022-05-05 DIAGNOSIS — N390 Urinary tract infection, site not specified: Secondary | ICD-10-CM | POA: Diagnosis not present

## 2022-05-05 DIAGNOSIS — I63411 Cerebral infarction due to embolism of right middle cerebral artery: Secondary | ICD-10-CM | POA: Diagnosis not present

## 2022-05-05 DIAGNOSIS — I4891 Unspecified atrial fibrillation: Secondary | ICD-10-CM | POA: Diagnosis present

## 2022-05-05 DIAGNOSIS — H539 Unspecified visual disturbance: Secondary | ICD-10-CM | POA: Diagnosis present

## 2022-05-05 DIAGNOSIS — G8194 Hemiplegia, unspecified affecting left nondominant side: Secondary | ICD-10-CM | POA: Diagnosis not present

## 2022-05-05 DIAGNOSIS — Z833 Family history of diabetes mellitus: Secondary | ICD-10-CM | POA: Diagnosis not present

## 2022-05-05 DIAGNOSIS — R2689 Other abnormalities of gait and mobility: Secondary | ICD-10-CM | POA: Diagnosis not present

## 2022-05-05 DIAGNOSIS — Z9861 Coronary angioplasty status: Secondary | ICD-10-CM

## 2022-05-05 DIAGNOSIS — Z515 Encounter for palliative care: Secondary | ICD-10-CM | POA: Diagnosis not present

## 2022-05-05 DIAGNOSIS — I639 Cerebral infarction, unspecified: Secondary | ICD-10-CM | POA: Diagnosis not present

## 2022-05-05 LAB — CBC
HCT: 36.6 % (ref 36.0–46.0)
Hemoglobin: 12.8 g/dL (ref 12.0–15.0)
MCH: 33 pg (ref 26.0–34.0)
MCHC: 35 g/dL (ref 30.0–36.0)
MCV: 94.3 fL (ref 80.0–100.0)
Platelets: 157 10*3/uL (ref 150–400)
RBC: 3.88 MIL/uL (ref 3.87–5.11)
RDW: 12.9 % (ref 11.5–15.5)
WBC: 8.5 10*3/uL (ref 4.0–10.5)
nRBC: 0 % (ref 0.0–0.2)

## 2022-05-05 LAB — BASIC METABOLIC PANEL
Anion gap: 11 (ref 5–15)
BUN: 13 mg/dL (ref 8–23)
CO2: 29 mmol/L (ref 22–32)
Calcium: 8.3 mg/dL — ABNORMAL LOW (ref 8.9–10.3)
Chloride: 97 mmol/L — ABNORMAL LOW (ref 98–111)
Creatinine, Ser: 0.84 mg/dL (ref 0.44–1.00)
GFR, Estimated: >60 mL/min (ref >60)
Glucose, Bld: 131 mg/dL — ABNORMAL HIGH (ref 70–99)
Potassium: 2.9 mmol/L — ABNORMAL LOW (ref 3.5–5.1)
Sodium: 137 mmol/L (ref 135–145)

## 2022-05-05 LAB — GLUCOSE, CAPILLARY
Glucose-Capillary: 154 mg/dL — ABNORMAL HIGH (ref 70–99)
Glucose-Capillary: 160 mg/dL — ABNORMAL HIGH (ref 70–99)
Glucose-Capillary: 142 mg/dL — ABNORMAL HIGH (ref 70–99)
Glucose-Capillary: 124 mg/dL — ABNORMAL HIGH (ref 70–99)
Glucose-Capillary: 149 mg/dL — ABNORMAL HIGH (ref 70–99)

## 2022-05-05 MED ORDER — ONDANSETRON 4 MG PO TBDP
4.0000 mg | ORAL_TABLET | Freq: Three times a day (TID) | ORAL | Status: DC | PRN
  Administered 2022-05-05: 4 mg via ORAL
  Filled 2022-05-05: qty 1

## 2022-05-05 MED ORDER — APIXABAN 5 MG PO TABS: 5.0000 mg | ORAL_TABLET | Freq: Two times a day (BID) | ORAL | Status: DC

## 2022-05-05 MED ORDER — APIXABAN 2.5 MG PO TABS: 2.5000 mg | ORAL_TABLET | Freq: Two times a day (BID) | ORAL | 1 refills | Status: DC

## 2022-05-05 MED ORDER — SENNOSIDES-DOCUSATE SODIUM 8.6-50 MG PO TABS: 1.0000 | ORAL_TABLET | Freq: Every evening | ORAL | Status: DC | PRN

## 2022-05-05 MED ORDER — ESTRADIOL 0.1 MG/GM VA CREA: 1.0000 | TOPICAL_CREAM | VAGINAL | Status: DC | PRN

## 2022-05-05 MED ORDER — TRAMADOL HCL 50 MG PO TABS
50.0000 mg | ORAL_TABLET | Freq: Two times a day (BID) | ORAL | Status: DC | PRN
  Administered 2022-05-07 – 2022-05-18 (×3): 50 mg via ORAL
  Filled 2022-05-05 (×3): qty 1

## 2022-05-05 MED ORDER — ACETAMINOPHEN 325 MG PO TABS
650.0000 mg | ORAL_TABLET | ORAL | Status: DC | PRN
  Administered 2022-05-08 – 2022-05-16 (×7): 650 mg via ORAL
  Filled 2022-05-05 (×7): qty 2

## 2022-05-05 MED ORDER — ACETAMINOPHEN 160 MG/5ML PO SOLN
650.0000 mg | ORAL | Status: DC | PRN
  Administered 2022-05-14: 650 mg
  Filled 2022-05-05: qty 20.3

## 2022-05-05 MED ORDER — POTASSIUM CHLORIDE 20 MEQ PO PACK
60.0000 meq | PACK | Freq: Once | ORAL | Status: AC
  Administered 2022-05-05: 60 meq via ORAL
  Filled 2022-05-05: qty 3

## 2022-05-05 MED ORDER — METOPROLOL TARTRATE 12.5 MG HALF TABLET
12.5000 mg | ORAL_TABLET | Freq: Two times a day (BID) | ORAL | Status: DC
  Administered 2022-05-05 – 2022-05-22 (×33): 12.5 mg via ORAL
  Filled 2022-05-05 (×34): qty 1

## 2022-05-05 MED ORDER — INSULIN ASPART 100 UNIT/ML IJ SOLN
0.0000 [IU] | Freq: Three times a day (TID) | INTRAMUSCULAR | Status: DC
  Administered 2022-05-05 – 2022-05-06 (×3): 1 [IU] via SUBCUTANEOUS
  Administered 2022-05-06 (×2): 2 [IU] via SUBCUTANEOUS
  Administered 2022-05-06 – 2022-05-07 (×5): 1 [IU] via SUBCUTANEOUS
  Administered 2022-05-08: 2 [IU] via SUBCUTANEOUS
  Administered 2022-05-08 – 2022-05-09 (×4): 1 [IU] via SUBCUTANEOUS
  Administered 2022-05-09: 2 [IU] via SUBCUTANEOUS
  Administered 2022-05-09: 1 [IU] via SUBCUTANEOUS
  Administered 2022-05-10: 3 [IU] via SUBCUTANEOUS
  Administered 2022-05-10: 1 [IU] via SUBCUTANEOUS
  Administered 2022-05-10: 2 [IU] via SUBCUTANEOUS
  Administered 2022-05-10: 3 [IU] via SUBCUTANEOUS
  Administered 2022-05-11: 2 [IU] via SUBCUTANEOUS
  Administered 2022-05-11: 1 [IU] via SUBCUTANEOUS
  Administered 2022-05-11: 3 [IU] via SUBCUTANEOUS
  Administered 2022-05-11: 1 [IU] via SUBCUTANEOUS
  Administered 2022-05-12 (×2): 2 [IU] via SUBCUTANEOUS
  Administered 2022-05-12: 1 [IU] via SUBCUTANEOUS
  Administered 2022-05-12 – 2022-05-13 (×2): 2 [IU] via SUBCUTANEOUS
  Administered 2022-05-13: 3 [IU] via SUBCUTANEOUS
  Administered 2022-05-13: 2 [IU] via SUBCUTANEOUS
  Administered 2022-05-13: 3 [IU] via SUBCUTANEOUS
  Administered 2022-05-14 (×3): 2 [IU] via SUBCUTANEOUS
  Administered 2022-05-14 – 2022-05-15 (×2): 3 [IU] via SUBCUTANEOUS
  Administered 2022-05-15: 1 [IU] via SUBCUTANEOUS
  Administered 2022-05-15: 3 [IU] via SUBCUTANEOUS
  Administered 2022-05-15: 1 [IU] via SUBCUTANEOUS
  Administered 2022-05-16 (×3): 2 [IU] via SUBCUTANEOUS
  Administered 2022-05-16: 3 [IU] via SUBCUTANEOUS
  Administered 2022-05-17: 1 [IU] via SUBCUTANEOUS
  Administered 2022-05-17: 2 [IU] via SUBCUTANEOUS
  Administered 2022-05-17 (×2): 1 [IU] via SUBCUTANEOUS
  Administered 2022-05-18 (×2): 3 [IU] via SUBCUTANEOUS
  Administered 2022-05-18: 2 [IU] via SUBCUTANEOUS
  Administered 2022-05-18: 3 [IU] via SUBCUTANEOUS
  Administered 2022-05-19 (×2): 2 [IU] via SUBCUTANEOUS
  Administered 2022-05-19: 1 [IU] via SUBCUTANEOUS
  Administered 2022-05-20: 2 [IU] via SUBCUTANEOUS
  Administered 2022-05-20 – 2022-05-21 (×4): 1 [IU] via SUBCUTANEOUS
  Administered 2022-05-21 (×2): 2 [IU] via SUBCUTANEOUS
  Administered 2022-05-21: 1 [IU] via SUBCUTANEOUS
  Administered 2022-05-22: 2 [IU] via SUBCUTANEOUS

## 2022-05-05 MED ORDER — SENNOSIDES-DOCUSATE SODIUM 8.6-50 MG PO TABS: 1.0000 | ORAL_TABLET | Freq: Every evening | ORAL | 0 refills | Status: DC | PRN

## 2022-05-05 MED ORDER — ONDANSETRON 4 MG PO TBDP
4.0000 mg | ORAL_TABLET | Freq: Three times a day (TID) | ORAL | Status: DC | PRN
  Administered 2022-05-06 – 2022-05-14 (×2): 4 mg via ORAL
  Filled 2022-05-05 (×3): qty 1

## 2022-05-05 MED ORDER — ACETAMINOPHEN 650 MG RE SUPP: 650.0000 mg | RECTAL | Status: DC | PRN

## 2022-05-05 MED ORDER — LIDOCAINE 5 % EX PTCH
1.0000 | MEDICATED_PATCH | Freq: Every day | CUTANEOUS | Status: DC | PRN
  Administered 2022-05-15 – 2022-05-21 (×5): 1 via TRANSDERMAL
  Filled 2022-05-05 (×4): qty 1

## 2022-05-05 MED ORDER — ONDANSETRON 4 MG PO TBDP: 4.0000 mg | ORAL_TABLET | Freq: Three times a day (TID) | ORAL | 0 refills | Status: DC | PRN

## 2022-05-05 MED ORDER — PANTOPRAZOLE SODIUM 40 MG IV SOLR
40.0000 mg | INTRAVENOUS | Status: DC
  Administered 2022-05-06: 40 mg via INTRAVENOUS
  Filled 2022-05-05: qty 10

## 2022-05-05 MED ORDER — APIXABAN 2.5 MG PO TABS
2.5000 mg | ORAL_TABLET | Freq: Two times a day (BID) | ORAL | Status: DC
  Administered 2022-05-05 – 2022-05-22 (×33): 2.5 mg via ORAL
  Filled 2022-05-05 (×34): qty 1

## 2022-05-05 MED ORDER — METOPROLOL TARTRATE 25 MG PO TABS: 12.5000 mg | ORAL_TABLET | Freq: Two times a day (BID) | ORAL | 0 refills | Status: DC

## 2022-05-05 MED ORDER — APIXABAN 2.5 MG PO TABS
2.5000 mg | ORAL_TABLET | Freq: Two times a day (BID) | ORAL | Status: DC
  Administered 2022-05-05: 2.5 mg via ORAL
  Filled 2022-05-05: qty 1

## 2022-05-05 MED ORDER — ZINC OXIDE 11.3 % EX CREA
TOPICAL_CREAM | Freq: Two times a day (BID) | CUTANEOUS | Status: DC
  Filled 2022-05-05: qty 56

## 2022-05-05 NOTE — H&P (Signed)
Physical Medicine and Rehabilitation Admission H&P     : HPI: Kara [name] is a 88 year old right-handed female history of atrial fibrillation/pacemaker not on anticoagulation, CAD, diet controlled diabetes mellitus, hypertension, hyperlipidemia, uterine cancer, history of L2 compression fracture using tramadol as needed.  Per chart review lives alone.  Patient resides in an independent living facility/[facility].  She has a PCA who assists with groceries and cleaning.  Patient ambulates with a rollator.  Presented 05/01/2022 with acute onset of left-sided weakness, right gaze deviation and dysarthria.  Blood pressure elevated 200s/100s.  Cranial CT scan showed age-indeterminate possibly acute infarct right cerebellum.  No hemorrhage.  CT angiogram head and neck acute occlusion of the right M1/M2 junction with nearly absent arborization in the right MCA territory.  Thrombus and acute occlusion of the distal A2 segment of the right ACA.  Patient received TNK and revascularization/thrombectomy per interventional radiology.  Follow-up CT of the head 05/02/2022 showed some early cytotoxic edema throughout most of the right MCA territory consistent with early subacute infarction.  No acute hemorrhage or midline shift.  Echocardiogram ejection fraction of 60 to 65% no wall motion abnormalities.  Admission chemistries unremarkable except potassium 2.7, glucose 198, total bilirubin 1.3, hemoglobin A1c 6.9.  Currently maintained on full-strength aspirin 325 mg daily changed to Eliquis 2.5 mg BID 12/18.  .    Currently on dysphagia #3 thin liquid diet.  Therapy evaluations completed due to patient decreased functional mobility left-sided weakness was admitted for a comprehensive rehab program.   Using hoyer for transfers  Review of Systems  Constitutional:  Negative for chills and fever.  HENT:  Negative for hearing loss.   Eyes:  Negative for blurred vision and double vision.  Respiratory:  Negative for cough,  shortness of breath and wheezing.   Cardiovascular:  Positive for palpitations. Negative for chest pain and leg swelling.  Gastrointestinal:  Positive for constipation. Negative for heartburn, nausea and vomiting.       GERD  Genitourinary:  Positive for dysuria. Negative for flank pain and hematuria.  Musculoskeletal:  Positive for back pain, joint pain and myalgias.  Skin:  Negative for rash.  Neurological:  Positive for speech change and weakness.  All other systems reviewed and are negative.       Past Medical History:  Diagnosis Date   Allergy     Atrial fibrillation (HCC)     CAD (coronary artery disease)     Cancer (HCC)      UTERINE   Chronic anticoagulation     Chronic diarrhea     Closed compression fracture of L2 lumbar vertebra, initial encounter ([place]) 11/08/2020    By CT scan done to evaluate severe persistent low back pain:  Impression below also notes spinal compression from bulging disk.     Superior endplate compression fracture of L2 with approximately 25% height loss and minimal, 2-3 mm retropulsion of the superior endplate.   Left-sided, nondisplaced zone 1 sacral insufficiency fracture.   Multilevel degenerative changes of the lumbar spine resulting    Closed intertrochanteric fracture of left femur ([place]) 08/22/2020   Decubitus ulcer      sacral region   Diabetes ([place])      diet controlled   Dysuria     E. coli UTI 11/21/2014   Edema of lower extremity      mainly right foot, slightly in left foot.   Facial fracture due to fall ([place]) 07/13/2020   Femur fracture, left ([place]) 06/09/2019  Fibrocystic breast disease     GERD (gastroesophageal reflux disease)     Glaucoma     Glaucoma     Hematuria     Hemorrhoids     History of colon polyps     History of pancreatitis     Hospital discharge follow-up 07/22/2020   Hyperlipidemia     Hypertension     Hypokalemia     IBS (irritable bowel syndrome)     Microscopic hematuria     Mitral valve regurgitation      Orbital fracture ([place]) 07/13/2020   Osteoarthritis      fingers   Pernicious anemia     Plantar fasciitis     Recurrent UTI     Skin cancer     Sleep apnea, obstructive     Umbilical hernia without obstruction and without gangrene 06/26/2015   Vaginal atrophy     Vitamin D deficiency     Yeast vaginitis           Past Surgical History:  Procedure Laterality Date   ABDOMINAL HYSTERECTOMY   1980's   ABDOMINAL SURGERY        for villous polyp,,,many years ago   APPENDECTOMY   1940's   ASCAD, s/p PTCA   11/28/2005    MID LESION    BREAST BIOPSY Left 1970's   CARPAL TUNNEL RELEASE Right 12/13/2015    Procedure: CARPAL TUNNEL RELEASE;  Surgeon: Thornton Park, MD;  Location: [facility];  Service: Orthopedics;  Laterality: Right;   COLECTOMY   2015   INTRAMEDULLARY (IM) NAIL INTERTROCHANTERIC Left 06/11/2019    Procedure: INTRAMEDULLARY (IM) NAIL INTERTROCHANTRIC;  Surgeon: Earnestine Leys, MD;  Location: [facility];  Service: Orthopedics;  Laterality: Left;   IR CT HEAD LTD   05/01/2022   IR PERCUTANEOUS ART THROMBECTOMY/INFUSION INTRACRANIAL INC DIAG ANGIO   05/01/2022   IR US GUIDE VASC ACCESS RIGHT   05/01/2022   KYPHOPLASTY N/A 11/15/2020    Procedure: Hewitt Shorts;  Surgeon: Hessie Knows, MD;  Location: [facility];  Service: Orthopedics;  Laterality: N/A;   PACEMAKER PLACEMENT       radation        for uterine cance   RADIOLOGY WITH ANESTHESIA N/A 05/01/2022    Procedure: IR WITH ANESTHESIA;  Surgeon: Radiologist, Medication, MD;  Location: [facility];  Service: Radiology;  Laterality: N/A;   REFRACTIVE SURGERY        for bilateral glaumoma   SACROPLASTY N/A 11/15/2020    Procedure: SACROPLASTY;  Surgeon: Hessie Knows, MD;  Location: [facility];  Service: Orthopedics;  Laterality: N/A;   TONSILLECTOMY   1936         Family History  Problem Relation Age of Onset   Coronary artery disease Father     Kidney disease Father     Cancer Sister     Diabetes Other     Cancer Mother           COLON   Bladder Cancer Neg Hx     Breast cancer Neg Hx      Social History:  reports that she has quit smoking. She has never used smokeless tobacco. She reports current alcohol use. She reports that she does not use drugs. Allergies:       Allergies  Allergen Reactions   Baycol [Cerivastatin Sodium]     Cardizem [Diltiazem Hcl]        LOWER EXTREMITY EDEMA   Crestor [Rosuvastatin Calcium] Other (See Comments)  INTOLERANT   Dronedarone Other (See Comments)      Fatigue   Metoprolol Tartrate Other (See Comments)   Omeprazole Other (See Comments)   Pravachol        INTOLERANT   Statins Other (See Comments)   Vioxx [Rofecoxib]     Zocor [Simvastatin]        INTOLERANT          Medications Prior to Admission  Medication Sig Dispense Refill   acetaminophen (TYLENOL) 500 MG tablet Take 2 tablets (1,000 mg total) by mouth every 12 (twelve) hours. 60 tablet 11   Cholecalciferol (VITAMIN D) 50 MCG (2000 UT) CAPS Take 1 capsule by mouth daily at 2 am.       Cranberry 250 MG TABS Take 2 tablets by mouth daily at 2 am.       cyanocobalamin (,VITAMIN B-12,) 1000 MCG/ML injection INJECT 1ML INTO THE MUSCLE EVERY 14 DAYS (Patient taking differently: Inject 1,000 mcg into the muscle See admin instructions. INJECT 1ML INTO THE MUSCLE EVERY 14 DAYS) 10 mL 0   diphenoxylate-atropine (LOMOTIL) 2.5-0.025 MG tablet TAKE 1 TABLET BY MOUTH THREE TIMES DAILYAS NEEDED FOR DIARRHEA (Patient taking differently: Take 1 tablet by mouth 3 (three) times daily as needed for diarrhea or loose stools.) 90 tablet 2   estradiol (ESTRACE) 0.1 MG/GM vaginal cream Place a dab of cream around the urethra nightly for 2 weeks,  then twice weekly thereafter 42.5 g 1   Infant Care Products (DERMACLOUD) OINT Apply 1 application topically as needed. 430 g 1   Lidocaine (SALONPAS PAIN RELIEVING) 4 % PTCH Apply 1 patch daily to your back and upper arm. 90 patch 1   metoprolol succinate (TOPROL-XL) 25 MG 24 hr tablet TAKE 1  TABLET BY MOUTH IN THE MORNING AND AT BEDTIME (Patient taking differently: Take 25 mg by mouth 2 (two) times daily at 8 am and 10 pm.) 180 tablet 0   traMADol (ULTRAM) 50 MG tablet Take 1 tablet (50 mg total) by mouth every 12 (twelve) hours as needed for moderate pain. 60 tablet 2   triamcinolone cream (KENALOG) 0.1 % APPLY 1 APPLICATION TOPICALLY 2 TIMES DAILY TO VULVA UNTIL ITCHING RESOLVES THEN REDUCE USE TO TWICE WEEKLY. (Patient taking differently: Apply 1 Application topically See admin instructions. APPLY 1 APPLICATION TOPICALLY 2 TIMES DAILY TO VULVA UNTIL ITCHING RESOLVES THEN REDUCE USE TO TWICE WEEKLY.) 80 g 1   diclofenac Sodium (VOLTAREN) 1 % GEL Apply 2 g topically 4 (four) times daily. To painful joints as needed (Patient not taking: Reported on 05/01/2022) 100 g 5   ondansetron (ZOFRAN) 4 MG tablet Take 1 tablet (4 mg total) by mouth every 6 (six) hours as needed for nausea. (Patient not taking: Reported on 05/01/2022) 20 tablet 0          Home: Home Living Family/patient expects to be discharged to:: Other (Comment) ([facility]dependent Living, [facility]) Living Arrangements: Alone Available Help at Discharge: Personal care attendant, Available PRN/intermittently Type of Home: Independent living facility Home Access: Level entry (elevator to get to 5th floor) Home Layout: One level Bathroom Shower/Tub: Multimedia programmer: Handicapped height Bathroom Accessibility: Yes Home Equipment: [place] - single point, Conservation officer, nature (2 wheels), Rollator (4 wheels) Additional Comments: likes to play [employer]  Lives With: Alone   Functional History: Prior Function Prior Level of Function : Needs assist Mobility Comments: PCA assists with groceries and cleaning. Pt ambulates with rollator ADLs Comments: does her own   Functional  Status:  Mobility: Bed Mobility Overal bed mobility: Needs Assistance Bed Mobility: Supine to Sit Supine to sit: HOB elevated, Mod assist, +2  for physical assistance, +2 for safety/equipment General bed mobility comments: assist with all aspects of mobility Transfers Overall transfer level: Needs assistance Equipment used: 2 person hand held assist Transfers: Sit to/from Stand, Bed to chair/wheelchair/BSC Sit to Stand: +2 physical assistance, Mod assist Bed to/from chair/wheelchair/BSC transfer type:: Stand pivot Stand pivot transfers: +2 physical assistance, Mod assist General transfer comment: bed>BSC>recliner transfer, pivot toward R   ADL: ADL Overall ADL's : Needs assistance/impaired Eating/Feeding: Moderate assistance, Sitting, Cueing for sequencing Grooming: Wash/dry face, Brushing hair, Moderate assistance, Sitting Grooming Details (indicate cue type and reason): asisst for seated balance as well as grooming task completion Upper Body Bathing: Moderate assistance Lower Body Bathing: Maximal assistance Upper Body Dressing : Maximal assistance Lower Body Dressing: Total assistance Toilet Transfer: Moderate assistance, +2 for physical assistance, +2 for safety/equipment, Stand-pivot, BSC/3in1 Toileting- Clothing Manipulation and Hygiene: Maximal assistance, +2 for physical assistance, +2 for safety/equipment, Cueing for sequencing, Sit to/from stand Toileting - Clothing Manipulation Details (indicate cue type and reason): 3rd person completing peri care Functional mobility during ADLs: Moderate assistance, +2 for physical assistance, +2 for safety/equipment, Cueing for sequencing, Cueing for safety General ADL Comments: LUE not using functionally, did not use at all during session - but would reach across midline using RUE to lift and move and interact with LUE   Cognition: Cognition Overall Cognitive Status: Impaired/Different from baseline Arousal/Alertness: Awake/alert Orientation Level: Oriented X4 Attention: Sustained Sustained Attention: Appears intact Memory: Impaired Memory Impairment: Decreased recall of  new information Awareness: Impaired Awareness Impairment: Intellectual impairment, Emergent impairment, Anticipatory impairment Problem Solving: Impaired Problem Solving Impairment: Functional basic (pertaining to L neglect) Safety/Judgment: Impaired Comments: very inattentive to her L side Cognition Arousal/Alertness: Awake/alert Behavior During Therapy: WFL for tasks assessed/performed Overall Cognitive Status: Impaired/Different from baseline Area of Impairment: Orientation, Attention, Memory, Following commands, Safety/judgement, Awareness, Problem solving Orientation Level: Disoriented to, Place, Time, Situation Current Attention Level: Sustained Memory: Decreased recall of precautions, Decreased short-term memory Following Commands: Follows one step commands with increased time Safety/Judgement: Decreased awareness of safety, Decreased awareness of deficits Awareness: Intellectual Problem Solving: Slow processing, Difficulty sequencing, Requires verbal cues General Comments: L inattention   Physical Exam: Blood pressure (!) 151/58, pulse 91, temperature 98.8 F (37.1 C), temperature source Oral, resp. rate 17, height '5\' 1"'$  (1.549 m), weight 60.4 kg, SpO2 93 %. Physical Exam Neurological:     Comments: Patient is.  Demonstrates left side neglect.  She is dysarthric but intelligible.  Provides name, place and age. Aware of CVA as reason for hospitalization Follows simple commands.    General: No acute distress Mood and affect are appropriate Heart: Regular rate and rhythm no rubs murmurs or extra sounds Lungs: Clear to auscultation, breathing unlabored, no rales or wheezes Abdomen: Positive bowel sounds, soft nontender to palpation, nondistended Extremities: No clubbing, cyanosis, or edema Skin: No evidence of breakdown, no evidence of rash Neurologic: Cranial nerves II through XII intact, motor strength is 4+/5 in rightdeltoid, bicep, tricep, grip, hip flexor, knee extensors,  ankle dorsiflexor and plantar flexor 2- Left biceps triceps and finger flexors, 2- knee extensors , 3- ankle DF/PF Sensory exam normal sensation light touch on RIght side reduced in LUE and LLE  Musculoskeletal: Full range of motion in all 4 extremities. No joint swelling     Lab Results Last 48 Hours  Results for orders placed or performed during the hospital encounter of 05/01/22 (from the past 48 hour(s))  Glucose, capillary     Status: Abnormal    Collection Time: 05/03/22 12:06 PM  Result Value Ref Range    Glucose-Capillary 103 (H) 70 - 99 mg/dL      Comment: Glucose reference range applies only to samples taken after fasting for at least 8 hours.  Glucose, capillary     Status: Abnormal    Collection Time: 05/03/22  4:32 PM  Result Value Ref Range    Glucose-Capillary 128 (H) 70 - 99 mg/dL      Comment: Glucose reference range applies only to samples taken after fasting for at least 8 hours.  Glucose, capillary     Status: Abnormal    Collection Time: 05/03/22  9:10 PM  Result Value Ref Range    Glucose-Capillary 170 (H) 70 - 99 mg/dL      Comment: Glucose reference range applies only to samples taken after fasting for at least 8 hours.    Comment 1 Notify RN      Comment 2 Document in Chart    CBC     Status: Abnormal    Collection Time: 05/04/22  3:52 AM  Result Value Ref Range    WBC 10.3 4.0 - 10.5 K/uL    RBC 3.89 3.87 - 5.11 MIL/uL    Hemoglobin 12.6 12.0 - 15.0 g/dL    HCT 37.7 36.0 - 46.0 %    MCV 96.9 80.0 - 100.0 fL    MCH 32.4 26.0 - 34.0 pg    MCHC 33.4 30.0 - 36.0 g/dL    RDW 13.4 11.5 - 15.5 %    Platelets 125 (L) 150 - 400 K/uL    nRBC 0.0 0.0 - 0.2 %      Comment: Performed at [facility], [place] [address], [place], [place] [address]  Basic metabolic panel     Status: Abnormal    Collection Time: 05/04/22  3:52 AM  Result Value Ref Range    Sodium 140 135 - 145 mmol/L    Potassium 3.5 3.5 - 5.1 mmol/L    Chloride 101 98 - 111 mmol/L     CO2 27 22 - 32 mmol/L    Glucose, Bld 135 (H) 70 - 99 mg/dL      Comment: Glucose reference range applies only to samples taken after fasting for at least 8 hours.    BUN 17 8 - 23 mg/dL    Creatinine, Ser 0.84 0.44 - 1.00 mg/dL    Calcium 8.4 (L) 8.9 - 10.3 mg/dL    GFR, Estimated >60 >60 mL/min      Comment: (NOTE) Calculated using the CKD-EPI Creatinine Equation (2021)      Anion gap 12 5 - 15      Comment: Performed at [facility] [address], [place], [place] [address]  Glucose, capillary     Status: Abnormal    Collection Time: 05/04/22  6:12 AM  Result Value Ref Range    Glucose-Capillary 154 (H) 70 - 99 mg/dL      Comment: Glucose reference range applies only to samples taken after fasting for at least 8 hours.    Comment 1 Document in Chart    Glucose, capillary     Status: Abnormal    Collection Time: 05/04/22 12:37 PM  Result Value Ref Range    Glucose-Capillary 156 (H) 70 - 99 mg/dL  Comment: Glucose reference range applies only to samples taken after fasting for at least 8 hours.  Glucose, capillary     Status: Abnormal    Collection Time: 05/04/22  4:51 PM  Result Value Ref Range    Glucose-Capillary 109 (H) 70 - 99 mg/dL      Comment: Glucose reference range applies only to samples taken after fasting for at least 8 hours.  Glucose, capillary     Status: Abnormal    Collection Time: 05/04/22  9:54 PM  Result Value Ref Range    Glucose-Capillary 147 (H) 70 - 99 mg/dL      Comment: Glucose reference range applies only to samples taken after fasting for at least 8 hours.    Comment 1 Notify RN      Comment 2 Document in Chart    Glucose, capillary     Status: Abnormal    Collection Time: 05/05/22  6:21 AM  Result Value Ref Range    Glucose-Capillary 160 (H) 70 - 99 mg/dL      Comment: Glucose reference range applies only to samples taken after fasting for at least 8 hours.    Comment 1 Notify RN      Comment 2 Document in Chart    CBC     Status:  None    Collection Time: 05/05/22  8:16 AM  Result Value Ref Range    WBC 8.5 4.0 - 10.5 K/uL    RBC 3.88 3.87 - 5.11 MIL/uL    Hemoglobin 12.8 12.0 - 15.0 g/dL    HCT 36.6 36.0 - 46.0 %    MCV 94.3 80.0 - 100.0 fL    MCH 33.0 26.0 - 34.0 pg    MCHC 35.0 30.0 - 36.0 g/dL    RDW 12.9 11.5 - 15.5 %    Platelets 157 150 - 400 K/uL    nRBC 0.0 0.0 - 0.2 %      Comment: Performed at [facility], [place] [address] [place], [place] [address]  Basic metabolic panel     Status: Abnormal    Collection Time: 05/05/22  8:16 AM  Result Value Ref Range    Sodium 137 135 - 145 mmol/L    Potassium 2.9 (L) 3.5 - 5.1 mmol/L    Chloride 97 (L) 98 - 111 mmol/L    CO2 29 22 - 32 mmol/L    Glucose, Bld 131 (H) 70 - 99 mg/dL      Comment: Glucose reference range applies only to samples taken after fasting for at least 8 hours.    BUN 13 8 - 23 mg/dL    Creatinine, Ser 0.84 0.44 - 1.00 mg/dL    Calcium 8.3 (L) 8.9 - 10.3 mg/dL    GFR, Estimated >60 >60 mL/min      Comment: (NOTE) Calculated using the CKD-EPI Creatinine Equation (2021)      Anion gap 11 5 - 15      Comment: Performed at [facility] [address] [place], [place] [address]    *Note: Due to a large number of results and/or encounters for the requested time period, some results have not been displayed. A complete set of results can be found in Results Review.      Imaging Results (Last 48 hours)  No results found.         Blood pressure (!) 151/58, pulse 91, temperature 98.8 F (37.1 C), temperature source Oral, resp. rate 17, height '5\' 1"'$  (  1.549 m), weight 60.4 kg, SpO2 93 %.   Medical Problem List and Plan: 1. Functional deficits secondary to right MCA infarction, A2 and P2 occlusion status post TNK/status post thrombectomy             -patient may  shower             -ELOS/Goals: 14-16d Min/Mod A goals for ADLs and Mobility  2.  Antithrombotics: -DVT/anticoagulation:  SCD             -antiplatelet therapy:  Eliquis 2.5 mg BID 3. Pain Management: Lidoderm patch.  Tramadol as needed 4. Mood/Behavior/Sleep: Provide emotional support             -antipsychotic agents: N/A 5. Neuropsych/cognition: This patient IS capable of making decisions on her own behalf. 6. Skin/Wound Care: Routine skin checks 7. Fluids/Electrolytes/Nutrition: Routine in and outs with follow-up chemistries 8.  Atrial fibrillation/pacemaker.  Lopressor 12.5 mg every 12 hours.  Cardiac rate controlled 9.  Diabetes mellitus.  Hemoglobin A1c 6.9.  SSI. 10.  Dysphagia.  Dysphagia #3 thin liquids.  Follow-up speech therapy 11.  GERD.  Protonix     Lavon Paganini Angiulli, PA-C 05/05/2022  "I have personally performed a face to face diagnostic evaluation of this patient.  Additionally, I have reviewed and concur with the physician assistant's documentation above."  Charlett Blake M.D. [facility] Fellow Am Acad of Phys Med and Rehab Diplomate Am Board of Electrodiagnostic Med Fellow Am Board of Interventional Pain

## 2022-05-05 NOTE — Progress Notes (Signed)
Inpatient [facility] Individual Statement of Services  Patient Name:  Kara [name]  Date:  05/05/2022  Welcome to the [facility].  Our goal is to provide you with an individualized program based on your diagnosis and situation, designed to meet your specific needs.  With this comprehensive rehabilitation program, you will be expected to participate in at least 3 hours of rehabilitation therapies Monday-Friday, with modified therapy programming on the weekends.  Your rehabilitation program will include the following services:  Physical Therapy (PT), Occupational Therapy (OT), Speech Therapy (ST), 24 hour per day rehabilitation nursing, Therapeutic Recreaction (TR), Neuropsychology, Care Coordinator, Rehabilitation Medicine, Nutrition Services, Pharmacy Services, and Other  Weekly team conferences will be held on Wednesdays to discuss your progress.  Your Inpatient Rehabilitation Care Coordinator will talk with you frequently to get your input and to update you on team discussions.  Team conferences with you and your family in attendance may also be held.  Expected length of stay:  14-16 Days  Overall anticipated outcome:  Min A  Depending on your progress and recovery, your program may change. Your Inpatient Rehabilitation Care Coordinator will coordinate services and will keep you informed of any changes. Your Inpatient Rehabilitation Care Coordinator's name and contact numbers are listed  below.  The following services may also be recommended but are not provided by the [facility]:   [place] will be made to provide these services after discharge if needed.  Arrangements include referral to agencies that provide these services.  Your insurance has been verified to be:   Medicare A & B Your primary doctor is:  Deborra Medina, MD  Pertinent information will be  shared with your doctor and your insurance company.  Inpatient Rehabilitation Care Coordinator:  Erlene Quan, [place] or [phone]  Information discussed with and copy given to patient by: Dyanne Iha, 05/05/2022, 3:34 PM

## 2022-05-05 NOTE — Progress Notes (Signed)
Inpatient Rehab Admissions Coordinator:  There is a bed available in CIR for pt today. Dr. Leonie Man is aware and in agreement. Pt, pt's daughter Delrae Rend, NSG, TOC made aware.   Gayland Curry, [place], [place] Admissions Coordinator [phone]

## 2022-05-05 NOTE — TOC Benefit Eligibility Note (Signed)
Patient Teacher, English as a foreign language completed.    The patient is currently admitted and upon discharge could be taking Eliquis 5 mg.  The current 30 day co-pay is $110.02.   The patient is insured through [place] [employer] [place] Medicare Part D   Lyndel Safe, [place] Patient Advocate Specialist [facility] Patient Advocate Team Direct Number: [phone]  Fax: [phone]

## 2022-05-05 NOTE — Progress Notes (Signed)
Speech Language Pathology Treatment: Dysphagia  Patient Details Name: Kara [name] MRN: [id number] DOB: Feb 04, 1923 Today's Date: 05/05/2022 Time: 4680-3212 SLP Time Calculation (min) (ACUTE ONLY): 17 min  Assessment / Plan / Recommendation Clinical Impression  Pt upright in chair, expressing desire to advance diet. Family member, present in room, reports pt with poor consumption of dys 2 solids. True dys 2 meal tray items consumed with pt largely expectorating due to dislike. She did masticate and clear x1 dys 2 bolus, exhibiting independent lingual sweeps to clear L buccal cavity. Regular textures significant for prolonged mastication and trace lingual residuals, L buccal cavity clear. Suspect diffuse residuals were impacted by reported dry mouth. No anterior loss of POs noted this date. Recommend dys 3 diet/thin liquids with use of lingual sweep and liquid washes to clear oral cavity of min residuals. Will f/u.    HPI HPI: Pt is a 88 yo female presenting from ILF 12/14 with L facial droop, L weakness, L hemineglect, R gaze deviation, and dysarthria. 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 MRI pending. Pt s/p TNK and mechanical thrombectomy. PMH includes: GERD, fall 2 years ago sustaining small amount of ICH, afib, CAD, DM, glaucoma, pacemaker, HLD, HTN, anemia, and OSA      SLP Plan  Continue with current plan of care      Recommendations for follow up therapy are one component of a multi-disciplinary discharge planning process, led by the attending physician.  Recommendations may be updated based on patient status, additional functional criteria and insurance authorization.    Recommendations  Diet recommendations: Dysphagia 3 (mechanical soft);Thin liquid Liquids provided via: Cup;Straw Medication Administration: Whole  meds with liquid Supervision: Patient able to self feed;Intermittent supervision to cue for compensatory strategies Compensations: Minimize environmental distractions;Slow rate;Small sips/bites;Lingual sweep for clearance of pocketing;Monitor for anterior loss;Follow solids with liquid Postural Changes and/or Swallow Maneuvers: Seated upright 90 degrees                Oral Care Recommendations: Oral care BID Follow Up Recommendations: Acute inpatient rehab (3hours/day) Assistance recommended at discharge: Frequent or constant Supervision/Assistance SLP Visit Diagnosis: Dysphagia, unspecified (R13.10) Plan: Continue with current plan of care            Ellwood Dense, [place], [place] Office Number: [place]  05/05/2022, 12:16 PM

## 2022-05-05 NOTE — Progress Notes (Signed)
ANTICOAGULATION CONSULT NOTE - Initial Consult  Pharmacy Consult for apixaban Indication: atrial fibrillation  Allergies  Allergen Reactions   Baycol [Cerivastatin Sodium]    Cardizem [Diltiazem Hcl]     LOWER EXTREMITY EDEMA   Crestor [Rosuvastatin Calcium] Other (See Comments)    INTOLERANT   Dronedarone Other (See Comments)    Fatigue   Metoprolol Tartrate Other (See Comments)   Omeprazole Other (See Comments)   Pravachol     INTOLERANT   Statins Other (See Comments)   Vioxx [Rofecoxib]    Zocor [Simvastatin]     INTOLERANT    Patient Measurements: Height: '5\' 1"'$  (154.9 cm) Weight: 60.4 kg (133 lb 2.5 oz) IBW/kg (Calculated) : 47.8   Vital Signs: Temp: 98.2 F (36.8 C) (12/18 1155) Temp Source: Oral (12/18 1155) BP: 151/70 (12/18 1155) Pulse Rate: 87 (12/18 1155)  Labs: Recent Labs    05/03/22 0339 05/04/22 0352 05/05/22 0816  HGB 11.8* 12.6 12.8  HCT 34.3* 37.7 36.6  PLT 124* 125* 157  CREATININE 0.90 0.84 0.84    Estimated Creatinine Clearance: 31.9 mL/min (by C-G formula based on SCr of 0.84 mg/dL).   Medical History: Past Medical History:  Diagnosis Date   Allergy    Atrial fibrillation (HCC)    CAD (coronary artery disease)    Cancer (HCC)    UTERINE   Chronic anticoagulation    Chronic diarrhea    Closed compression fracture of L2 lumbar vertebra, initial encounter ([place]) 11/08/2020   By CT scan done to evaluate severe persistent low back pain:  Impression below also notes spinal compression from bulging disk.     Superior endplate compression fracture of L2 with approximately 25% height loss and minimal, 2-3 mm retropulsion of the superior endplate.   Left-sided, nondisplaced zone 1 sacral insufficiency fracture.   Multilevel degenerative changes of the lumbar spine resulting    Closed intertrochanteric fracture of left femur ([place]) 08/22/2020   Decubitus ulcer    sacral region   Diabetes ([place])    diet controlled   Dysuria    E. coli UTI  11/21/2014   Edema of lower extremity    mainly right foot, slightly in left foot.   Facial fracture due to fall ([place]) 07/13/2020   Femur fracture, left ([place]) 06/09/2019   Fibrocystic breast disease    GERD (gastroesophageal reflux disease)    Glaucoma    Glaucoma    Hematuria    Hemorrhoids    History of colon polyps    History of pancreatitis    Hospital discharge follow-up 07/22/2020   Hyperlipidemia    Hypertension    Hypokalemia    IBS (irritable bowel syndrome)    Microscopic hematuria    Mitral valve regurgitation    Orbital fracture ([place]) 07/13/2020   Osteoarthritis    fingers   Pernicious anemia    Plantar fasciitis    Recurrent UTI    Skin cancer    Sleep apnea, obstructive    Umbilical hernia without obstruction and without gangrene 06/26/2015   Vaginal atrophy    Vitamin D deficiency    Yeast vaginitis      Assessment: 88 year old woman admitted for stroke, likely caused by afib. No anticoagulation prior to admission. Pharmacy consulted for apixaban.    Scr 0.84, weight 60.4, age 26. Discussed with team who agrees with apixaban 2.'5mg'$ .    Goal of Therapy:   Monitor platelets by anticoagulation protocol: Yes   Plan:  Start apixaban 2.'5mg'$  BID Monitor  for signs/symptoms of bleeding    Benetta Spar, PharmD, BCPS, [facility] Clinical Pharmacist  Please check AMION for all [place] phone numbers After 10:00 PM, call [place] [phone]

## 2022-05-05 NOTE — Progress Notes (Signed)
Signed     PMR Admission Coordinator Pre-Admission Assessment   Patient: Kara [name] is an 88 y.o., female MRN: [id number] DOB: March 20, 1923 Height: _0  (154.9 cm) Weight: 60.4 kg   Insurance Information HMO:     PPO:      PCP:      IPA:      80/20: yes     OTHER:  PRIMARY: Medicare A & B      Policy#: [id number]      Subscriber: patient CM Name:       Phone#:      Fax#:  Pre-Cert#:       Employer:  Benefits:  Phone #: verified eligibility via [place] on 05/03/22     Name:  Eff. Date: Part A & B effective 09/16/89     Deduct: $1,600      Out of Pocket Max: NA      Life Max: NA CIR: 100% coverage      SNF: 100% coverage days 1-20, 80% coverage days 21-100 Outpatient: 80% coverage     Co-Pay: 20% Home Health: 100% coverage      Co-Pay:  DME: 80% coverage     Co-Pay: 20% Providers: pt' choce SECONDARY: BCBS Supplement      Policy#: [id number]     Phone#: [phone]   Financial Counselor:       Phone#:    The "Data Collection Information Summary" for patients in Inpatient Rehabilitation Facilities with attached "Privacy Act [place] Records" was provided and verbally reviewed with: Patient and Family   Emergency Contact Information Contact Information       Name Relation Home Work Mobile    [place] Daughter [phone]        Lamm,Woody Relative     [phone]    Hunt[place] Estelle     [phone]           Current Medical History  Patient Admitting Diagnosis: CVA History of Present Illness: Pt is a 88 year old female with medical hx significant for: A-fib, CAD, DM, glaucoma, pacemaker, HTN, HLD, anemia, OSA. Pt presented to [facility] as Code Stroke d/t acute onset of left facial droop, left hemiplegia, left hemineglect, right gaze deviation and dysarthria. CT head showed age indeterminate, possible acute infarct in right cerebellum. Negative for hemorrhage. CTA head and neck showed acute occlusion at right M1/M2 junction, thrombus and acute  occlusion of distal A2 segment of right ACA, focal occlusion of P2 segment of right PCA. TNK administered. Pt underwent mechanical thrombectomy on 12/14. Repeat CT on 12/15 showed large right MCA infarct without overt bleeding. Therapy evaluations completed and CIR recommended d/t pt's deficits in functional mobility, dysphagia and inability to perform ADLs independently.  Complete NIHSS TOTAL: 12   Patient's medical record [facility] has been reviewed by the rehabilitation admission coordinator and physician.   Past Medical History      Past Medical History:  Diagnosis Date   Allergy     Atrial fibrillation (HCC)     CAD (coronary artery disease)     Cancer (HCC)      UTERINE   Chronic anticoagulation     Chronic diarrhea     Closed compression fracture of L2 lumbar vertebra, initial encounter ([place]) 11/08/2020    By CT scan done to evaluate severe persistent low back pain:  Impression below also notes spinal compression from bulging disk.     Superior endplate compression fracture of L2 with approximately 25% height loss  and minimal, 2-3 mm retropulsion of the superior endplate.   Left-sided, nondisplaced zone 1 sacral insufficiency fracture.   Multilevel degenerative changes of the lumbar spine resulting    Closed intertrochanteric fracture of left femur ([place]) 08/22/2020   Decubitus ulcer      sacral region   Diabetes ([place])      diet controlled   Dysuria     E. coli UTI 11/21/2014   Edema of lower extremity      mainly right foot, slightly in left foot.   Facial fracture due to fall ([place]) 07/13/2020   Femur fracture, left ([place]) 06/09/2019   Fibrocystic breast disease     GERD (gastroesophageal reflux disease)     Glaucoma     Glaucoma     Hematuria     Hemorrhoids     History of colon polyps     History of pancreatitis     Hospital discharge follow-up 07/22/2020   Hyperlipidemia     Hypertension     Hypokalemia     IBS (irritable bowel syndrome)     Microscopic  hematuria     Mitral valve regurgitation     Orbital fracture ([place]) 07/13/2020   Osteoarthritis      fingers   Pernicious anemia     Plantar fasciitis     Recurrent UTI     Skin cancer     Sleep apnea, obstructive     Umbilical hernia without obstruction and without gangrene 06/26/2015   Vaginal atrophy     Vitamin D deficiency     Yeast vaginitis        Has the patient had major surgery during 100 days prior to admission? Yes   Family History   family history includes Cancer in her mother and sister; Coronary artery disease in her father; Diabetes in an other family member; Kidney disease in her father.   Current Medications   Current Facility-Administered Medications:    acetaminophen (TYLENOL) tablet 650 mg, 650 mg, Oral, Q4H PRN, 650 mg at 05/05/22 0924 **OR** acetaminophen (TYLENOL) 160 MG/5ML solution 650 mg, 650 mg, Per Tube, Q4H PRN **OR** acetaminophen (TYLENOL) suppository 650 mg, 650 mg, Rectal, Q4H PRN, Kerney Elbe, MD   aspirin EC tablet 325 mg, 325 mg, Oral, Daily, Rosalin Hawking, MD, 325 mg at 05/05/22 4742   Chlorhexidine Gluconate Cloth 2 % PADS 6 each, 6 each, Topical, Daily, Amie Portland, MD, 6 each at 05/04/22 1336   estradiol (ESTRACE) vaginal cream 1 Applicatorful, 1 Applicatorful, Vaginal, Q72H PRN, Charlean Merl, Marcelino Scot, NP   heparin injection 5,000 Units, 5,000 Units, Subcutaneous, Q8H, Rosalin Hawking, MD, 5,000 Units at 05/05/22 0649   insulin aspart (novoLOG) injection 0-9 Units, 0-9 Units, Subcutaneous, TID AC & HS, Amie Portland, MD, 2 Units at 05/05/22 0650   lidocaine (LIDODERM) 5 % 1 patch, 1 patch, Transdermal, Daily PRN, Janine Ores, NP, 1 patch at 05/03/22 1231   metoprolol tartrate (LOPRESSOR) tablet 12.5 mg, 12.5 mg, Oral, Q12H, Shafer, [place], NP, 12.5 mg at 05/05/22 5956   Oral care mouth rinse, 15 mL, Mouth Rinse, PRN, Amie Portland, MD   pantoprazole (PROTONIX) injection 40 mg, 40 mg, Intravenous, Q24H, Rosalin Hawking, MD, 40 mg at 05/04/22 0125    senna-docusate (Senokot-S) tablet 1 tablet, 1 tablet, Oral, QHS PRN, Kerney Elbe, MD   traMADol Veatrice [place]bon) tablet 50 mg, 50 mg, Oral, Q12H PRN, Janine Ores, NP, 50 mg at 05/04/22 0352   triamcinolone cream (KENALOG) 0.1 % cream, , Topical, BID, Charlean Merl, [place],  NP, Given at 05/04/22 2145   zinc oxide (BALMEX) 11.3 % cream, , Topical, BID, Janine Ores, NP, Given at 05/04/22 2145   Patients Current Diet:  Diet Order                  DIET DYS 2 Room service appropriate? Yes with Assist; Fluid consistency: Thin  Diet effective now                         Precautions / Restrictions Precautions Precautions: Fall Precaution Comments: L inattention Restrictions Weight Bearing Restrictions: No    Has the patient had 2 or more falls or a fall with injury in the past year? No   Prior Activity Level Limited Community (1-2x/wk): doctor's appointments, hair, grocery store   Prior Functional Level Self Care: Did the patient need help bathing, dressing, using the toilet or eating? Needed some help   Indoor Mobility: Did the patient need assistance with walking from room to room (with or without device)? Independent   Stairs: Did the patient need assistance with internal or external stairs (with or without device)? Unknown (pt uses elevator at residence)   Functional Cognition: Did the patient need help planning regular tasks such as shopping or remembering to take medications? Independent   Patient Information Are you of Hispanic, Latino/a,or Spanish origin?: A. No, not of Hispanic, Latino/a, or Spanish origin What is your race?: A. White Do you need or want an interpreter to communicate with a doctor or health care staff?: 0. No   Patient's Response To:  Health Literacy and Transportation Is the patient able to respond to health literacy and transportation needs?: Yes Health Literacy - How often do you need to have someone help you when you read instructions, pamphlets, or other  written material from your doctor or pharmacy?: Never (needs help with understanding it) In the past 12 months, has lack of transportation kept you from medical appointments or from getting medications?: No In the past 12 months, has lack of transportation kept you from meetings, work, or from getting things needed for daily living?: No   Home Assistive Devices / [place]: Cane - single point, Conservation officer, nature (2 wheels), Rollator (4 wheels)   Prior Device Use: Indicate devices/aids used by the patient prior to current illness, exacerbation or injury? Walker   Current Functional Level Cognition   Arousal/Alertness: Awake/alert Overall Cognitive Status: Impaired/Different from baseline Current Attention Level: Sustained Orientation Level: Oriented X4 Following Commands: Follows one step commands with increased time Safety/Judgement: Decreased awareness of safety, Decreased awareness of deficits General Comments: L inattention Attention: Sustained Sustained Attention: Appears intact Memory: Impaired Memory Impairment: Decreased recall of new information Awareness: Impaired Awareness Impairment: Intellectual impairment, Emergent impairment, Anticipatory impairment Problem Solving: Impaired Problem Solving Impairment: Functional basic (pertaining to L neglect) Safety/Judgment: Impaired Comments: very inattentive to her L side    Extremity Assessment (includes Sensation/Coordination)   Upper Extremity Assessment: LUE deficits/detail LUE Deficits / Details: did not actively move LUE during session, initially stating she was able to sense touch on upper arm, but inconsistent. LUE Sensation: decreased light touch LUE Coordination: decreased fine motor, decreased gross motor  Lower Extremity Assessment: Defer to PT evaluation LLE Deficits / Details: grossly 3/5 LLE Sensation: decreased light touch     ADLs   Overall ADL's : Needs assistance/impaired Eating/Feeding:  Moderate assistance, Sitting, Cueing for sequencing Grooming: Wash/dry face, Brushing hair, Moderate assistance, Sitting Grooming Details (indicate cue type  and reason): asisst for seated balance as well as grooming task completion Upper Body Bathing: Moderate assistance Lower Body Bathing: Maximal assistance Upper Body Dressing : Maximal assistance Lower Body Dressing: Total assistance Toilet Transfer: Moderate assistance, +2 for physical assistance, +2 for safety/equipment, Stand-pivot, BSC/3in1 Toileting- Clothing Manipulation and Hygiene: Maximal assistance, +2 for physical assistance, +2 for safety/equipment, Cueing for sequencing, Sit to/from stand Toileting - Clothing Manipulation Details (indicate cue type and reason): 3rd person completing peri care Functional mobility during ADLs: Moderate assistance, +2 for physical assistance, +2 for safety/equipment, Cueing for sequencing, Cueing for safety General ADL Comments: LUE not using functionally, did not use at all during session - but would reach across midline using RUE to lift and move and interact with LUE     Mobility   Overal bed mobility: Needs Assistance Bed Mobility: Supine to Sit Supine to sit: HOB elevated, Mod assist, +2 for physical assistance, +2 for safety/equipment General bed mobility comments: assist with all aspects of mobility     Transfers   Overall transfer level: Needs assistance Equipment used: 2 person hand held assist Transfers: Sit to/from Stand, Bed to chair/wheelchair/BSC Sit to Stand: +2 physical assistance, Mod assist Bed to/from chair/wheelchair/BSC transfer type:: Stand pivot Stand pivot transfers: +2 physical assistance, Mod assist General transfer comment: bed>BSC>recliner transfer, pivot toward R     Ambulation / Gait / Stairs / Wheelchair Mobility         Posture / Balance Dynamic Sitting Balance Sitting balance - Comments: strong left lateral lean, mod-maxA to correct. Pt is able to lean to  right side with request, but without cues falls to left Balance Overall balance assessment: Needs assistance Sitting-balance support: Single extremity supported, Feet supported Sitting balance-Leahy Scale: Poor Sitting balance - Comments: strong left lateral lean, mod-maxA to correct. Pt is able to lean to right side with request, but without cues falls to left Postural control: Left lateral lean Standing balance support: Bilateral upper extremity supported, During functional activity Standing balance-Leahy Scale: Poor Standing balance comment: mod-maxA     Special needs/care consideration Oxygen 2L O2 via nasal cannula, Skin Puncture: groin/right; Erythema/Redness: groin, leg, sacrum/right, lower; Ecchymosis: arm/left, Bladder incontinence, External urinary catheter and Diabetic management novoLOG 0-9 units every 4 hours    Previous Home Environment (from acute therapy documentation) Living Arrangements: Alone  Lives With: Alone Available Help at Discharge: Personal care attendant, Available PRN/intermittently Type of Home: Independent living facility Home Layout: One level Home Access: Level entry (elevator to get to 5th floor) Bathroom Shower/Tub: Multimedia programmer: Handicapped height Bathroom Accessibility: Yes How Accessible: Accessible via wheelchair Home Care Services: No Additional Comments: likes to play Bridge card game   Discharge Living Setting Plans for Discharge Living Setting: Patient's home ([place]) Type of Home at Discharge: [place] Name at Discharge: [facility] Discharge Home Layout: One level Discharge Home Access: Level entry Discharge Bathroom Shower/Tub: Walk-in shower Discharge Bathroom Toilet: Handicapped height Discharge Bathroom Accessibility: Yes How Accessible: Accessible via wheelchair Does the patient have any problems obtaining your medications?: No   Social/Family/Support Systems Anticipated  Caregiver: Rosana Hoes, daughter; personcal care attendant, facilty staff Anticipated Caregiver's Contact Information: [name]: [phone] Caregiver Availability: 24/7 Discharge Plan Discussed with Primary Caregiver: Yes Is Caregiver In Agreement with Plan?: Yes Does Caregiver/Family have Issues with Lodging/Transportation while Pt is in Rehab?: No   Goals Patient/Family Goal for Rehab: MIn A:PT/OT/ST Expected length of stay: 14-16 days Pt/Family Agrees to Admission and willing to participate:  Yes Program Orientation Provided & Reviewed with Pt/Caregiver Including Roles  & Responsibilities: Yes   Decrease burden of Care through IP rehab admission: NA   Possible need for SNF placement upon discharge: May discharge [facility] if not able to qualify for AL[facility] at [facility]   Patient Condition: I have reviewed medical records from [facility], spoken with CM, and patient and daughter. I met with patient at the bedside for inpatient rehabilitation assessment.  Patient will benefit from ongoing PT, OT, and SLP, can actively participate in 3 hours of therapy a day 5 days of the week, and can make measurable gains during the admission.  Patient will also benefit from the coordinated team approach during an Inpatient Acute Rehabilitation admission.  The patient will receive intensive therapy as well as Rehabilitation physician, nursing, social worker, and care management interventions.  Due to bladder management, safety, skin/wound care, disease management, medication administration, pain management, and patient education the patient requires 24 hour a day rehabilitation nursing.  The patient is currently Mod A with mobility and Mod-Total A with basic ADLs.  Discharge setting and therapy post discharge at skilled nursing facility (at [facility]) is anticipated.  Patient has agreed to participate in the Acute Inpatient Rehabilitation Program and will admit today.    Preadmission Screen Completed By:  Bethel Born, 05/05/2022 11:12 AM ______________________________________________________________________   Discussed status with Dr. Letta Pate on 05/05/22  at 11:14 AM and received approval for admission today.   Admission Coordinator:  Bethel Born, CCC-SLP, time 11:14 AM/Date 05/05/22     Assessment/Plan: Diagnosis:RIght MCA, ACA and PCA cardioembolic infarct Does the need for close, 24 hr/day Medical supervision in concert with the patient's rehab needs make it unreasonable for this patient to be served in a less intensive setting? Yes Co-Morbidities requiring supervision/potential complications: A fib, HTN, CAD Due to bladder management, bowel management, safety, skin/wound care, disease management, medication administration, pain management, and patient education, does the patient require 24 hr/day rehab nursing? Yes Does the patient require coordinated care of a physician, rehab nurse, PT, OT, and SLP to address physical and functional deficits in the context of the above medical diagnosis(es)? Yes Addressing deficits in the following areas: balance, endurance, locomotion, strength, transferring, bowel/bladder control, bathing, dressing, feeding, grooming, toileting, cognition, swallowing, and psychosocial support Can the patient actively participate in an intensive therapy program of at least 3 hrs of therapy 5 days a week? Yes The potential for patient to make measurable gains while on inpatient rehab is fair Anticipated functional outcomes upon discharge from inpatient rehab: min assist and mod assist PT, min assist and mod assist OT, supervision and min assist SLP Estimated rehab length of stay to reach the above functional goals is: 14-16d Anticipated discharge destination: Home 10. Overall Rehab/Functional Prognosis: good     MD Signature: Charlett Blake M.D. [place] [facility] Fellow Am Acad of Phys Med and  Rehab Diplomate Am Board of Electrodiagnostic Med Fellow Am Board of Interventional Pain

## 2022-05-05 NOTE — Progress Notes (Signed)
SLP Evaluation Note   05/02/22 1200  SLP Visit Information  SLP Received On 05/02/22  SLP Time Calculation  SLP Start Time (ACUTE ONLY) 1120  SLP Stop Time (ACUTE ONLY) 1141  SLP Time Calculation (min) (ACUTE ONLY) 21 min  Subjective  Subjective pleasant, not attending to L side  Patient/Family Stated Goal to get coffee  General Information  HPI Pt is a 88 yo female presenting from [facility] 12/14 with L facial droop, L weakness, L hemineglect, R gaze deviation, and dysarthria. 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 MRI pending. Pt s/p TNK and mechanical thrombectomy. PMH includes: GERD, fall 2 years ago sustaining small amount of ICH, afib, CAD, DM, glaucoma, pacemaker, HLD, HTN, anemia, and OSA  Prior Functional Status  Cognitive/Linguistic Baseline WFL  Type of Home Independent living facility   Lives With Alone  Pain Assessment  Pain Assessment Faces  Faces Pain Scale 0  Oral Motor/Sensory Function  Overall Oral Motor/Sensory Function Moderate impairment  Facial ROM Reduced left;Suspected CN VII (facial) dysfunction  Facial Symmetry Abnormal symmetry left;Suspected CN VII (facial) dysfunction  Facial Strength Reduced left;Suspected CN VII (facial) dysfunction  Facial Sensation Suspected CN V (Trigeminal) dysfunction;Reduced left  Lingual ROM WFL  Lingual Symmetry WFL  Lingual Strength WFL  Velum  (difficult to visualize)  Cognition  Overall Cognitive Status Impaired/Different from baseline  Arousal/Alertness Awake/alert  Orientation Level Oriented X4  Attention Sustained  Sustained Attention Appears intact  Memory Impaired  Memory Impairment Decreased recall of new information  Awareness Impaired  Awareness Impairment Intellectual impairment;Emergent impairment;Anticipatory impairment  Problem Solving Impaired   Problem Solving Impairment Functional basic (pertaining to L neglect)  Safety/Judgment Impaired  Comments very inattentive to her L side  Auditory Comprehension  Overall Auditory Comprehension Appears within functional limits for tasks assessed  Expression  Primary Mode of Expression Verbal  Verbal Expression  Overall Verbal Expression Appears within functional limits for tasks assessed  Motor Speech  Overall Motor Speech Impaired  Respiration WFL  Phonation Normal  Resonance WFL  Articulation Impaired  Level of Impairment Conversation  Intelligibility Intelligible  SLP - End of Session  Patient left in bed;with call bell/phone within reach;with family/visitor present  Nurse Communication Diet recommendation;Cognitive/Linguistic strategies reviewed  Assessment  Clinical Impression Statement (ACUTE ONLY) Pt is neglectful of her L side, not knowing that her LUE is hers despite cueing. With cueing, she can bring her gaze at least to midline though. She does not show intellectual awareness of any acute physical changes. On a delayed recall task she recalls 3/4 words (increasing to 100% with minimal cueing), but other information she seems to have more trouble with - suspect more so related to awareness. Pt also continues to have mild dysarthria, improved from previous date per daughter but not back to baseline. Pt will benefit from SLP follow up.  SLP Recommendation/Assessment Patient needs continued Speech Lanaguage Pathology Services  SLP Visit Diagnosis Dysarthria and anarthria (R47.1);Cognitive communication deficit (R41.841)  Problem List Memory;Problem Solving;Other (comment) (awareness)  Plan  Speech Therapy Frequency (ACUTE ONLY) min 2x/week  Duration 2 weeks  Treatment/Interventions Environmental controls;Cueing hierarchy;Cognitive reorganization;Internal/external aids;Functional tasks;SLP instruction and feedback;Compensatory strategies;Patient/family education  Potential to  Achieve Goals (ACUTE ONLY) Good  Potential Considerations (ACUTE ONLY) Ability to learn/carryover information;Severity of impairments  SLP Recommendations  Follow Up Recommendations  (  tba)  Assistance recommended at discharge Frequent or constant Supervision/Assistance  Functional Status Assessment Patient has had a recent decline in their functional status and demonstrates the ability to make significant improvements in function in a reasonable and predictable amount of time.  SLP Equipment None recommended by SLP  Individuals Consulted  Consulted and Agree with Results and Recommendations Patient;Family member/caregiver  Family Member Consulted  daughter  SLP Goals  Progress/Goals/Alternative treatment plan discussed with pt/caregiver and they Agree  SLP Evaluations  $ SLP Speech Visit 1 Visit  SLP Evaluations  $ SLP EVAL LANGUAGE/SOUND PRODUCTION 1 Procedure    Osie Bond., M.A. [place] Office [phone]  Secure chat preferred

## 2022-05-05 NOTE — TOC Transition Note (Signed)
Transition of Care [facility]) - CM/SW Discharge Note   Patient Details  Name: Kara [name] MRN: [id number] Date of Birth: December 31, 1922  [facility] CM/SW Contact:  Pollie Friar, RN Phone Number: 05/05/2022, 1:20 PM   Clinical Narrative:    Pt is discharging to CIR today. CM signing off.    Final next level of care: IP Rehab Facility Barriers to Discharge: No Barriers Identified   Patient Goals and CMS Choice   CMS Medicare.gov Compare Post Acute Care list provided to:: Patient Choice offered to / list presented to : Patient  Discharge Placement                       Discharge Plan and Services                                     Social Determinants of Health (SDOH) Interventions     Readmission Risk Interventions     No data to display

## 2022-05-05 NOTE — H&P (Incomplete)
Physical Medicine and Rehabilitation Admission H&P    : HPI: Kara [name] is a 88 year old right-handed female history of atrial fibrillation/pacemaker not on anticoagulation, CAD, diet controlled diabetes mellitus, hypertension, hyperlipidemia, uterine cancer, history of L2 compression fracture using tramadol as needed.  Per chart review lives alone.  Patient resides in an independent living facility/[facility].  She has a PCA who assists with groceries and cleaning.  Patient ambulates with a rollator.  Presented 05/01/2022 with acute onset of left-sided weakness, right gaze deviation and dysarthria.  Blood pressure elevated 200s/100s.  Cranial CT scan showed age-indeterminate possibly acute infarct right cerebellum.  No hemorrhage.  CT angiogram head and neck acute occlusion of the right M1/M2 junction with nearly absent arborization in the right MCA territory.  Thrombus and acute occlusion of the distal A2 segment of the right ACA.  Patient received TNK and revascularization/thrombectomy per interventional radiology.  Follow-up CT of the head 05/02/2022 showed some early cytotoxic edema throughout most of the right MCA territory consistent with early subacute infarction.  No acute hemorrhage or midline shift.  Echocardiogram ejection fraction of 60 to 65% no wall motion abnormalities.  Admission chemistries unremarkable except potassium 2.7, glucose 198, total bilirubin 1.3, hemoglobin A1c 6.9.  Currently maintained on full-strength aspirin 325 mg daily changed to Eliquis 2.5 mg BID 12/18.  .    Currently on dysphagia #3 thin liquid diet.  Therapy evaluations completed due to patient decreased functional mobility left-sided weakness was admitted for a comprehensive rehab program.  Review of Systems  Constitutional:  Negative for chills and fever.  HENT:  Negative for hearing loss.   Eyes:  Negative for blurred vision and double vision.  Respiratory:  Negative for cough, shortness of breath and  wheezing.   Cardiovascular:  Positive for palpitations. Negative for chest pain and leg swelling.  Gastrointestinal:  Positive for constipation. Negative for heartburn, nausea and vomiting.       GERD  Genitourinary:  Positive for dysuria. Negative for flank pain and hematuria.  Musculoskeletal:  Positive for back pain, joint pain and myalgias.  Skin:  Negative for rash.  Neurological:  Positive for speech change and weakness.  All other systems reviewed and are negative.  Past Medical History:  Diagnosis Date   Allergy    Atrial fibrillation (HCC)    CAD (coronary artery disease)    Cancer (HCC)    UTERINE   Chronic anticoagulation    Chronic diarrhea    Closed compression fracture of L2 lumbar vertebra, initial encounter ([place]) 11/08/2020   By CT scan done to evaluate severe persistent low back pain:  Impression below also notes spinal compression from bulging disk.     Superior endplate compression fracture of L2 with approximately 25% height loss and minimal, 2-3 mm retropulsion of the superior endplate.   Left-sided, nondisplaced zone 1 sacral insufficiency fracture.   Multilevel degenerative changes of the lumbar spine resulting    Closed intertrochanteric fracture of left femur ([place]) 08/22/2020   Decubitus ulcer    sacral region   Diabetes ([place])    diet controlled   Dysuria    E. coli UTI 11/21/2014   Edema of lower extremity    mainly right foot, slightly in left foot.   Facial fracture due to fall ([place]) 07/13/2020   Femur fracture, left ([place]) 06/09/2019   Fibrocystic breast disease    GERD (gastroesophageal reflux disease)    Glaucoma    Glaucoma    Hematuria  Hemorrhoids    History of colon polyps    History of pancreatitis    Hospital discharge follow-up 07/22/2020   Hyperlipidemia    Hypertension    Hypokalemia    IBS (irritable bowel syndrome)    Microscopic hematuria    Mitral valve regurgitation    Orbital fracture (HCC) 07/13/2020   Osteoarthritis    fingers    Pernicious anemia    Plantar fasciitis    Recurrent UTI    Skin cancer    Sleep apnea, obstructive    Umbilical hernia without obstruction and without gangrene 06/26/2015   Vaginal atrophy    Vitamin D deficiency    Yeast vaginitis    Past Surgical History:  Procedure Laterality Date   ABDOMINAL HYSTERECTOMY  1980's   ABDOMINAL SURGERY     for villous polyp,,,many years ago   APPENDECTOMY  1940's   ASCAD, s/p PTCA  11/28/2005   MID LESION    BREAST BIOPSY Left 1970's   CARPAL TUNNEL RELEASE Right 12/13/2015   Procedure: CARPAL TUNNEL RELEASE;  Surgeon: Thornton Park, MD;  Location: [facility];  Service: Orthopedics;  Laterality: Right;   COLECTOMY  2015   INTRAMEDULLARY (IM) NAIL INTERTROCHANTERIC Left 06/11/2019   Procedure: INTRAMEDULLARY (IM) NAIL INTERTROCHANTRIC;  Surgeon: Earnestine Leys, MD;  Location: [facility];  Service: Orthopedics;  Laterality: Left;   IR CT HEAD LTD  05/01/2022   IR PERCUTANEOUS ART THROMBECTOMY/INFUSION INTRACRANIAL INC DIAG ANGIO  05/01/2022   IR US GUIDE VASC ACCESS RIGHT  05/01/2022   KYPHOPLASTY N/A 11/15/2020   Procedure: Hewitt Shorts;  Surgeon: Hessie Knows, MD;  Location: [facility];  Service: Orthopedics;  Laterality: N/A;   PACEMAKER PLACEMENT     radation     for uterine cance   RADIOLOGY WITH ANESTHESIA N/A 05/01/2022   Procedure: IR WITH ANESTHESIA;  Surgeon: Radiologist, Medication, MD;  Location: [facility];  Service: Radiology;  Laterality: N/A;   REFRACTIVE SURGERY     for bilateral glaumoma   SACROPLASTY N/A 11/15/2020   Procedure: SACROPLASTY;  Surgeon: Hessie Knows, MD;  Location: [facility];  Service: Orthopedics;  Laterality: N/A;   TONSILLECTOMY  1936   Family History  Problem Relation Age of Onset   Coronary artery disease Father    Kidney disease Father    Cancer Sister    Diabetes Other    Cancer Mother        COLON   Bladder Cancer Neg Hx    Breast cancer Neg Hx    Social History:  reports that she has quit smoking. She has  never used smokeless tobacco. She reports current alcohol use. She reports that she does not use drugs. Allergies:  Allergies  Allergen Reactions   Baycol [Cerivastatin Sodium]    Cardizem [Diltiazem Hcl]     LOWER EXTREMITY EDEMA   Crestor [Rosuvastatin Calcium] Other (See Comments)    INTOLERANT   Dronedarone Other (See Comments)    Fatigue   Metoprolol Tartrate Other (See Comments)   Omeprazole Other (See Comments)   Pravachol     INTOLERANT   Statins Other (See Comments)   Vioxx [Rofecoxib]    Zocor [Simvastatin]     INTOLERANT   Medications Prior to Admission  Medication Sig Dispense Refill   acetaminophen (TYLENOL) 500 MG tablet Take 2 tablets (1,000 mg total) by mouth every 12 (twelve) hours. 60 tablet 11   Cholecalciferol (VITAMIN D) 50 MCG (2000 UT) CAPS Take 1 capsule by mouth daily at 2 am.  Cranberry 250 MG TABS Take 2 tablets by mouth daily at 2 am.     cyanocobalamin (,VITAMIN B-12,) 1000 MCG/ML injection INJECT 1ML INTO THE MUSCLE EVERY 14 DAYS (Patient taking differently: Inject 1,000 mcg into the muscle See admin instructions. INJECT 1ML INTO THE MUSCLE EVERY 14 DAYS) 10 mL 0   diphenoxylate-atropine (LOMOTIL) 2.5-0.025 MG tablet TAKE 1 TABLET BY MOUTH THREE TIMES DAILYAS NEEDED FOR DIARRHEA (Patient taking differently: Take 1 tablet by mouth 3 (three) times daily as needed for diarrhea or loose stools.) 90 tablet 2   estradiol (ESTRACE) 0.1 MG/GM vaginal cream Place a dab of cream around the urethra nightly for 2 weeks,  then twice weekly thereafter 42.5 g 1   Infant Care Products (DERMACLOUD) OINT Apply 1 application topically as needed. 430 g 1   Lidocaine (SALONPAS PAIN RELIEVING) 4 % PTCH Apply 1 patch daily to your back and upper arm. 90 patch 1   metoprolol succinate (TOPROL-XL) 25 MG 24 hr tablet TAKE 1 TABLET BY MOUTH IN THE MORNING AND AT BEDTIME (Patient taking differently: Take 25 mg by mouth 2 (two) times daily at 8 am and 10 pm.) 180 tablet 0    traMADol (ULTRAM) 50 MG tablet Take 1 tablet (50 mg total) by mouth every 12 (twelve) hours as needed for moderate pain. 60 tablet 2   triamcinolone cream (KENALOG) 0.1 % APPLY 1 APPLICATION TOPICALLY 2 TIMES DAILY TO VULVA UNTIL ITCHING RESOLVES THEN REDUCE USE TO TWICE WEEKLY. (Patient taking differently: Apply 1 Application topically See admin instructions. APPLY 1 APPLICATION TOPICALLY 2 TIMES DAILY TO VULVA UNTIL ITCHING RESOLVES THEN REDUCE USE TO TWICE WEEKLY.) 80 g 1   diclofenac Sodium (VOLTAREN) 1 % GEL Apply 2 g topically 4 (four) times daily. To painful joints as needed (Patient not taking: Reported on 05/01/2022) 100 g 5   ondansetron (ZOFRAN) 4 MG tablet Take 1 tablet (4 mg total) by mouth every 6 (six) hours as needed for nausea. (Patient not taking: Reported on 05/01/2022) 20 tablet 0      Home: Home Living Family/patient expects to be discharged to:: Other (Comment) ([facility], [facility]) Living Arrangements: Alone Available Help at Discharge: Personal care attendant, Available PRN/intermittently Type of Home: Independent living facility Home Access: Level entry (elevator to get to 5th floor) Home Layout: One level Bathroom Shower/Tub: Multimedia programmer: Handicapped height Bathroom Accessibility: Yes Home Equipment: Russell - single point, Conservation officer, nature (2 wheels), Rollator (4 wheels) Additional Comments: likes to play [employer]  Lives With: Alone   Functional History: Prior Function Prior Level of Function : Needs assist Mobility Comments: PCA assists with groceries and cleaning. Pt ambulates with rollator ADLs Comments: does her own  Functional Status:  Mobility: Bed Mobility Overal bed mobility: Needs Assistance Bed Mobility: Supine to Sit Supine to sit: HOB elevated, Mod assist, +2 for physical assistance, +2 for safety/equipment General bed mobility comments: assist with all aspects of mobility Transfers Overall transfer level:  Needs assistance Equipment used: 2 person hand held assist Transfers: Sit to/from Stand, Bed to chair/wheelchair/BSC Sit to Stand: +2 physical assistance, Mod assist Bed to/from chair/wheelchair/BSC transfer type:: Stand pivot Stand pivot transfers: +2 physical assistance, Mod assist General transfer comment: bed>BSC>recliner transfer, pivot toward R      ADL: ADL Overall ADL's : Needs assistance/impaired Eating/Feeding: Moderate assistance, Sitting, Cueing for sequencing Grooming: Wash/dry face, Brushing hair, Moderate assistance, Sitting Grooming Details (indicate cue type and reason): asisst for seated balance as well as grooming  task completion Upper Body Bathing: Moderate assistance Lower Body Bathing: Maximal assistance Upper Body Dressing : Maximal assistance Lower Body Dressing: Total assistance Toilet Transfer: Moderate assistance, +2 for physical assistance, +2 for safety/equipment, Stand-pivot, BSC/3in1 Toileting- Clothing Manipulation and Hygiene: Maximal assistance, +2 for physical assistance, +2 for safety/equipment, Cueing for sequencing, Sit to/from stand Toileting - Clothing Manipulation Details (indicate cue type and reason): 3rd person completing peri care Functional mobility during ADLs: Moderate assistance, +2 for physical assistance, +2 for safety/equipment, Cueing for sequencing, Cueing for safety General ADL Comments: LUE not using functionally, did not use at all during session - but would reach across midline using RUE to lift and move and interact with LUE  Cognition: Cognition Overall Cognitive Status: Impaired/Different from baseline Arousal/Alertness: Awake/alert Orientation Level: Oriented X4 Attention: Sustained Sustained Attention: Appears intact Memory: Impaired Memory Impairment: Decreased recall of new information Awareness: Impaired Awareness Impairment: Intellectual impairment, Emergent impairment, Anticipatory impairment Problem Solving:  Impaired Problem Solving Impairment: Functional basic (pertaining to L neglect) Safety/Judgment: Impaired Comments: very inattentive to her L side Cognition Arousal/Alertness: Awake/alert Behavior During Therapy: WFL for tasks assessed/performed Overall Cognitive Status: Impaired/Different from baseline Area of Impairment: Orientation, Attention, Memory, Following commands, Safety/judgement, Awareness, Problem solving Orientation Level: Disoriented to, Place, Time, Situation Current Attention Level: Sustained Memory: Decreased recall of precautions, Decreased short-term memory Following Commands: Follows one step commands with increased time Safety/Judgement: Decreased awareness of safety, Decreased awareness of deficits Awareness: Intellectual Problem Solving: Slow processing, Difficulty sequencing, Requires verbal cues General Comments: L inattention  Physical Exam: Blood pressure (!) 151/58, pulse 91, temperature 98.8 F (37.1 C), temperature source Oral, resp. rate 17, height '5\' 1"'$  (1.549 m), weight 60.4 kg, SpO2 93 %. Physical Exam Neurological:     Comments: Patient is.  Demonstrates left side neglect.  She is dysarthric but intelligible.  Provides name and age.  Follows simple commands.     Results for orders placed or performed during the hospital encounter of 05/01/22 (from the past 48 hour(s))  Glucose, capillary     Status: Abnormal   Collection Time: 05/03/22 12:06 PM  Result Value Ref Range   Glucose-Capillary 103 (H) 70 - 99 mg/dL    Comment: Glucose reference range applies only to samples taken after fasting for at least 8 hours.  Glucose, capillary     Status: Abnormal   Collection Time: 05/03/22  4:32 PM  Result Value Ref Range   Glucose-Capillary 128 (H) 70 - 99 mg/dL    Comment: Glucose reference range applies only to samples taken after fasting for at least 8 hours.  Glucose, capillary     Status: Abnormal   Collection Time: 05/03/22  9:10 PM  Result Value  Ref Range   Glucose-Capillary 170 (H) 70 - 99 mg/dL    Comment: Glucose reference range applies only to samples taken after fasting for at least 8 hours.   Comment 1 Notify RN    Comment 2 Document in Chart   CBC     Status: Abnormal   Collection Time: 05/04/22  3:52 AM  Result Value Ref Range   WBC 10.3 4.0 - 10.5 K/uL   RBC 3.89 3.87 - 5.11 MIL/uL   Hemoglobin 12.6 12.0 - 15.0 g/dL   HCT 37.7 36.0 - 46.0 %   MCV 96.9 80.0 - 100.0 fL   MCH 32.4 26.0 - 34.0 pg   MCHC 33.4 30.0 - 36.0 g/dL   RDW 13.4 11.5 - 15.5 %   Platelets 125 (L) 150 -  400 K/uL   nRBC 0.0 0.0 - 0.2 %    Comment: Performed at [facility], [place] [address] [place], [place] [address]  Basic metabolic panel     Status: Abnormal   Collection Time: 05/04/22  3:52 AM  Result Value Ref Range   Sodium 140 135 - 145 mmol/L   Potassium 3.5 3.5 - 5.1 mmol/L   Chloride 101 98 - 111 mmol/L   CO2 27 22 - 32 mmol/L   Glucose, Bld 135 (H) 70 - 99 mg/dL    Comment: Glucose reference range applies only to samples taken after fasting for at least 8 hours.   BUN 17 8 - 23 mg/dL   Creatinine, Ser 0.84 0.44 - 1.00 mg/dL   Calcium 8.4 (L) 8.9 - 10.3 mg/dL   GFR, Estimated >60 >60 mL/min    Comment: (NOTE) Calculated using the CKD-EPI Creatinine Equation (2021)    Anion gap 12 5 - 15    Comment: Performed at [facility] [address] [place], [place] [address]  Glucose, capillary     Status: Abnormal   Collection Time: 05/04/22  6:12 AM  Result Value Ref Range   Glucose-Capillary 154 (H) 70 - 99 mg/dL    Comment: Glucose reference range applies only to samples taken after fasting for at least 8 hours.   Comment 1 Document in Chart   Glucose, capillary     Status: Abnormal   Collection Time: 05/04/22 12:37 PM  Result Value Ref Range   Glucose-Capillary 156 (H) 70 - 99 mg/dL    Comment: Glucose reference range applies only to samples taken after fasting for at least 8 hours.  Glucose, capillary     Status:  Abnormal   Collection Time: 05/04/22  4:51 PM  Result Value Ref Range   Glucose-Capillary 109 (H) 70 - 99 mg/dL    Comment: Glucose reference range applies only to samples taken after fasting for at least 8 hours.  Glucose, capillary     Status: Abnormal   Collection Time: 05/04/22  9:54 PM  Result Value Ref Range   Glucose-Capillary 147 (H) 70 - 99 mg/dL    Comment: Glucose reference range applies only to samples taken after fasting for at least 8 hours.   Comment 1 Notify RN    Comment 2 Document in Chart   Glucose, capillary     Status: Abnormal   Collection Time: 05/05/22  6:21 AM  Result Value Ref Range   Glucose-Capillary 160 (H) 70 - 99 mg/dL    Comment: Glucose reference range applies only to samples taken after fasting for at least 8 hours.   Comment 1 Notify RN    Comment 2 Document in Chart   CBC     Status: None   Collection Time: 05/05/22  8:16 AM  Result Value Ref Range   WBC 8.5 4.0 - 10.5 K/uL   RBC 3.88 3.87 - 5.11 MIL/uL   Hemoglobin 12.8 12.0 - 15.0 g/dL   HCT 36.6 36.0 - 46.0 %   MCV 94.3 80.0 - 100.0 fL   MCH 33.0 26.0 - 34.0 pg   MCHC 35.0 30.0 - 36.0 g/dL   RDW 12.9 11.5 - 15.5 %   Platelets 157 150 - 400 K/uL   nRBC 0.0 0.0 - 0.2 %    Comment: Performed at [facility], [place] [address], [place], [place] [address]  Basic metabolic panel     Status: Abnormal  Collection Time: 05/05/22  8:16 AM  Result Value Ref Range   Sodium 137 135 - 145 mmol/L   Potassium 2.9 (L) 3.5 - 5.1 mmol/L   Chloride 97 (L) 98 - 111 mmol/L   CO2 29 22 - 32 mmol/L   Glucose, Bld 131 (H) 70 - 99 mg/dL    Comment: Glucose reference range applies only to samples taken after fasting for at least 8 hours.   BUN 13 8 - 23 mg/dL   Creatinine, Ser 0.84 0.44 - 1.00 mg/dL   Calcium 8.3 (L) 8.9 - 10.3 mg/dL   GFR, Estimated >60 >60 mL/min    Comment: (NOTE) Calculated using the CKD-EPI Creatinine Equation (2021)    Anion gap 11 5 - 15    Comment: Performed at [facility] [address], [place], [place] [address]   *Note: Due to a large number of results and/or encounters for the requested time period, some results have not been displayed. A complete set of results can be found in Results Review.   No results found.    Blood pressure (!) 151/58, pulse 91, temperature 98.8 F (37.1 C), temperature source Oral, resp. rate 17, height '5\' 1"'$  (1.549 m), weight 60.4 kg, SpO2 93 %.  Medical Problem List and Plan: 1. Functional deficits secondary to right MCA infarction, A2 and P2 occlusion status post TNK/status post thrombectomy  -patient may *** shower  -ELOS/Goals: *** 2.  Antithrombotics: -DVT/anticoagulation:  SCD  -antiplatelet therapy: Eliquis 2.5 mg BID 3. Pain Management: Lidoderm patch.  Tramadol as needed 4. Mood/Behavior/Sleep: Provide emotional support  -antipsychotic agents: N/A 5. Neuropsych/cognition: This patient IS capable of making decisions on her own behalf. 6. Skin/Wound Care: Routine skin checks 7. Fluids/Electrolytes/Nutrition: Routine in and outs with follow-up chemistries 8.  Atrial fibrillation/pacemaker.  Lopressor 12.5 mg every 12 hours.  Cardiac rate controlled 9.  Diabetes mellitus.  Hemoglobin A1c 6.9.  SSI. 10.  Dysphagia.  Dysphagia #3 thin liquids.  Follow-up speech therapy 11.  GERD.  Protonix   Cathlyn Parsons, PA-C 05/05/2022

## 2022-05-05 NOTE — Consult Note (Signed)
Consult for pain in region of previous pressure injury, WOC will follow up with bedside clinical staff.  [place], [place], [place]

## 2022-05-05 NOTE — Consult Note (Signed)
WOC consulted for pain at previous pressure injury. Verified with bedside nursing that patient does not have pressure injury currently. Will add chair pressure redistribution pad for use. Patient is transferring to CIR.   Discussed POC with patient and bedside nurse. Re consult if needed, will not follow at this time. Thanks  Gwynne Kemnitz [employer], RN,CWOCN, CNS, [place] [phone])

## 2022-05-05 NOTE — Progress Notes (Signed)
Physical Therapy Treatment Patient Details Name: Kara [name] MRN: [id number] DOB: Jan 10, 1923 Today's Date: 05/05/2022   History of Present Illness 88 y.o. female presents to [facility] hospital on 05/01/2022 with acute onset left facial droop, hemiplegia and hemineglect. CTA reveals occlusion at R M1/M2 junction, distal A2 segment of R ACA, and P2 segment of R PCA. Pt received TNK and underwent mechanical thrombectomy on 12/14. PMH includes Afib, CAD, uterine cancer, L2 compression fx, DM, glaucoma, HTN, HLD.    PT Comments    Pt greeted supine in bed and agreeable to session with focus on sitting balance and sitting tolerance before handoff to SLP. Pt able to complete bed mobility with mod assist with cues needed throughout for sequencing. Pt with continued L lateral lean and intermittent posterior lean as pt fatigue increasing. Pt with x1 laying back on bed slowly, with pt stating "I'm tired, I'm just going to lay down" and needing cues throughout to attend to task and maintain awake/alertness. Pt able to complete R propped sitting and come back to midline with min assist with pt then able to maintain midline for ~ 10 seconds at a time. Pt able to squat pivot to chair for session with SLP. Current plan remains appropriate to address deficits and maximize functional independence and decrease caregiver burden. Pt continues to benefit from skilled PT services to progress toward functional mobility goals.    Recommendations for follow up therapy are one component of a multi-disciplinary discharge planning process, led by the attending physician.  Recommendations may be updated based on patient status, additional functional criteria and insurance authorization.  Follow Up Recommendations  Acute inpatient rehab (3hours/day)     Assistance Recommended at Discharge Frequent or constant Supervision/Assistance  Patient can return home with the following A lot of help with walking and/or transfers;A lot of help  with bathing/dressing/bathroom   Equipment Recommendations  Wheelchair (measurements PT);Hospital bed    Recommendations for Other Services       Precautions / Restrictions Precautions Precautions: Fall Precaution Comments: L inattention Restrictions Weight Bearing Restrictions: No     Mobility  Bed Mobility Overal bed mobility: Needs Assistance Bed Mobility: Supine to Sit     Supine to sit: Mod assist, HOB elevated     General bed mobility comments: assist with all aspects of mobility    Transfers Overall transfer level: Needs assistance Equipment used: None (face to face trasnfer) Transfers: Bed to chair/wheelchair/BSC       Squat pivot transfers: Max assist     General transfer comment: max assist to squat pivot to chair for SLP    Ambulation/Gait                   Stairs             Wheelchair Mobility    Modified Rankin (Stroke Patients Only) Modified Rankin (Stroke Patients Only) Pre-Morbid Rankin Score: Moderate disability Modified Rankin: Severe disability     Balance Overall balance assessment: Needs assistance Sitting-balance support: Single extremity supported, Feet supported Sitting balance-Leahy Scale: Poor Sitting balance - Comments: strong left lateral lean, mod-maxA to correct. Pt is able to lean to right side with request, but without cues falls to left and posterior Postural control: Left lateral lean Standing balance support: Bilateral upper extremity supported, During functional activity Standing balance-Leahy Scale: Poor Standing balance comment: mod-maxA  Cognition Arousal/Alertness: Awake/alert Behavior During Therapy: WFL for tasks assessed/performed Overall Cognitive Status: Impaired/Different from baseline Area of Impairment: Attention, Memory, Safety/judgement, Awareness, Problem solving                     Memory: Decreased recall of precautions, Decreased  short-term memory Following Commands: Follows one step commands consistently Safety/Judgement: Decreased awareness of safety, Decreased awareness of deficits   Problem Solving: Slow processing, Difficulty sequencing, Requires verbal cues General Comments: L inattention        Exercises Other Exercises Other Exercises: seated balance with propped sitting on R elbow>coming to midline x5    General Comments General comments (skin integrity, edema, etc.): VSS on RA      Pertinent Vitals/Pain Pain Assessment Pain Assessment: Faces Faces Pain Scale: Hurts a little bit Pain Location: general during mobility Pain Descriptors / Indicators: Grimacing Pain Intervention(s): Monitored during session, Limited activity within patient's tolerance, Repositioned    Home Living                          Prior Function            PT Goals (current goals can now be found in the care plan section) Acute Rehab PT Goals Patient Stated Goal: to regain strength and mobility PT Goal Formulation: With patient Time For Goal Achievement: 05/16/22    Frequency    Min 4X/week      PT Plan Current plan remains appropriate    Co-evaluation              AM-PAC PT "6 Clicks" Mobility   Outcome Measure  Help needed turning from your back to your side while in a flat bed without using bedrails?: A Lot Help needed moving from lying on your back to sitting on the side of a flat bed without using bedrails?: A Lot Help needed moving to and from a bed to a chair (including a wheelchair)?: Total Help needed standing up from a chair using your arms (e.g., wheelchair or bedside chair)?: Total Help needed to walk in hospital room?: Total Help needed climbing 3-5 steps with a railing? : Total 6 Click Score: 8    End of Session Equipment Utilized During Treatment: Gait belt Activity Tolerance: Patient tolerated treatment well Patient left: in chair;with call bell/phone within reach;with  chair alarm set;with family/visitor present;Other (comment) (maximove sling in recliner, with SLP present) Nurse Communication: Mobility status;Need for lift equipment PT Visit Diagnosis: Other abnormalities of gait and mobility (R26.89);Other symptoms and signs involving the nervous system (R29.898);Muscle weakness (generalized) (M62.81);Hemiplegia and hemiparesis Hemiplegia - Right/Left: Left Hemiplegia - dominant/non-dominant: Non-dominant Hemiplegia - caused by: Cerebral infarction     Time: 1025-8527 PT Time Calculation (min) (ACUTE ONLY): 17 min  Charges:  $Therapeutic Activity: 8-22 mins                     Laraina Sulton R. PTA Acute Rehabilitation Services Office: [place] 05/05/2022, 12:08 PM

## 2022-05-05 NOTE — Discharge Summary (Addendum)
Stroke Discharge Summary  Patient ID: Kara [name]   MRN: [id number]      DOB: [birth year]  Date of Admission: 05/01/2022 Date of Discharge: 05/05/2022  Attending Physician:  Stroke, Md, MD, Stroke MD Consultant(s):   None Patient's PCP:  Crecencio Mc, MD  Discharge Diagnoses: Right brain infarct due to right MCA, A2 and P2 occlusion s/p TNK and IR with TICI3, etiology likely due to Afib not on [facility]  Principal Problem:   Acute ischemic stroke ([place]) Left hemiplegia Atrial fibrillation   Medications to be continued on Rehab Allergies as of 05/05/2022       Reactions   Baycol [cerivastatin Sodium]    Cardizem [diltiazem Hcl]    LOWER EXTREMITY EDEMA   Crestor [rosuvastatin Calcium] Other (See Comments)   INTOLERANT   Dronedarone Other (See Comments)   Fatigue   Metoprolol Tartrate Other (See Comments)   Omeprazole Other (See Comments)   Pravachol    INTOLERANT   Statins Other (See Comments)   Vioxx [rofecoxib]    Zocor [simvastatin]    INTOLERANT        Medication List     STOP taking these medications    metoprolol succinate 25 MG 24 hr tablet Commonly known as: TOPROL-XL       TAKE these medications    acetaminophen 500 MG tablet Commonly known as: TYLENOL Take 2 tablets (1,000 mg total) by mouth every 12 (twelve) hours.   apixaban 2.5 MG Tabs tablet Commonly known as: ELIQUIS Take 1 tablet (2.5 mg total) by mouth 2 (two) times daily.   Cranberry 250 MG Tabs Take 2 tablets by mouth daily at 2 am.   cyanocobalamin 1000 MCG/ML injection Commonly known as: VITAMIN B12 INJECT 1ML INTO THE MUSCLE EVERY 14 DAYS What changed: See the new instructions.   Dermacloud Oint Apply 1 application topically as needed.   diphenoxylate-atropine 2.5-0.025 MG tablet Commonly known as: LOMOTIL TAKE 1 TABLET BY MOUTH THREE TIMES DAILYAS NEEDED FOR DIARRHEA What changed: See the new instructions.   estradiol 0.1 MG/GM vaginal cream Commonly known as:  ESTRACE Place a dab of cream around the urethra nightly for 2 weeks,  then twice weekly thereafter   lidocaine 4 % Commonly known as: Salonpas Pain Relieving Apply 1 patch daily to your back and upper arm.   metoprolol tartrate 25 MG tablet Commonly known as: LOPRESSOR Take 0.5 tablets (12.5 mg total) by mouth every 12 (twelve) hours.   ondansetron 4 MG disintegrating tablet Commonly known as: ZOFRAN-ODT Take 1 tablet (4 mg total) by mouth every 8 (eight) hours as needed for nausea or vomiting.   senna-docusate 8.6-50 MG tablet Commonly known as: Senokot-S Take 1 tablet by mouth at bedtime as needed for mild constipation.   traMADol 50 MG tablet Commonly known as: ULTRAM Take 1 tablet (50 mg total) by mouth every 12 (twelve) hours as needed for moderate pain.   triamcinolone cream 0.1 % Commonly known as: KENALOG APPLY 1 APPLICATION TOPICALLY 2 TIMES DAILY TO VULVA UNTIL ITCHING RESOLVES THEN REDUCE USE TO TWICE WEEKLY. What changed:  how much to take how to take this when to take this   Vitamin D 50 MCG (2000 UT) Caps Take 1 capsule by mouth daily at 2 am.        LABORATORY STUDIES CBC    Component Value Date/Time   WBC 8.5 05/05/2022 0816   RBC 3.88 05/05/2022 0816   HGB 12.8 05/05/2022 0816  HGB 13.7 05/22/2014 1404   HCT 36.6 05/05/2022 0816   HCT 41.1 05/22/2014 1404   PLT 157 05/05/2022 0816   PLT 193 05/22/2014 1404   MCV 94.3 05/05/2022 0816   MCV 92 05/22/2014 1404   MCH 33.0 05/05/2022 0816   MCHC 35.0 05/05/2022 0816   RDW 12.9 05/05/2022 0816   RDW 13.7 05/22/2014 1404   LYMPHSABS SPECIMEN CLOTTED 05/01/2022 1435   LYMPHSABS 2.6 05/22/2014 1404   MONOABS SPECIMEN CLOTTED 05/01/2022 1435   MONOABS 0.4 05/22/2014 1404   EOSABS SPECIMEN CLOTTED 05/01/2022 1435   EOSABS 0.1 05/22/2014 1404   BASOSABS SPECIMEN CLOTTED 05/01/2022 1435   BASOSABS 0.0 05/22/2014 1404   CMP    Component Value Date/Time   NA 137 05/05/2022 0816   NA 142  05/22/2014 1404   K 2.9 (L) 05/05/2022 0816   K 3.2 (L) 05/22/2014 1404   CL 97 (L) 05/05/2022 0816   CL 105 05/22/2014 1404   CO2 29 05/05/2022 0816   CO2 28 05/22/2014 1404   GLUCOSE 131 (H) 05/05/2022 0816   GLUCOSE 135 (H) 05/22/2014 1404   BUN 13 05/05/2022 0816   BUN 18 05/22/2014 1404   CREATININE 0.84 05/05/2022 0816   CREATININE 0.82 09/15/2014 1536   CALCIUM 8.3 (L) 05/05/2022 0816   CALCIUM 8.7 05/22/2014 1404   PROT 6.1 (L) 05/01/2022 1650   PROT 5.7 (L) 02/05/2012 0700   ALBUMIN 3.3 (L) 05/01/2022 1650   ALBUMIN 2.5 (L) 02/05/2012 0700   AST 25 05/01/2022 1650   AST 17 02/05/2012 0700   ALT 18 05/01/2022 1650   ALT 19 02/05/2012 0700   ALKPHOS 47 05/01/2022 1650   ALKPHOS 76 02/05/2012 0700   BILITOT 1.3 (H) 05/01/2022 1650   BILITOT 0.7 02/05/2012 0700   GFRNONAA >60 05/05/2022 0816   GFRNONAA 56 (L) 05/22/2014 1404   GFRNONAA >60 02/12/2012 0735   GFRAA >60 06/13/2019 0525   GFRAA >60 05/22/2014 1404   GFRAA >60 02/12/2012 0735   COAGS Lab Results  Component Value Date   INR SPECIMEN CLOTTED 05/01/2022   INR 1.1 07/13/2020   INR 1.2 06/09/2019   PROTIME 1.9 (A) 03/01/2018   PROTIME 1.6 (A) 12/30/2016   PROTIME 2.4 (A) 04/18/2015   Lipid Panel    Component Value Date/Time   CHOL 122 05/02/2022 0619   TRIG 114 05/02/2022 0619   HDL 41 05/02/2022 0619   CHOLHDL 3.0 05/02/2022 0619   VLDL 23 05/02/2022 0619   LDLCALC 58 05/02/2022 0619   HgbA1C  Lab Results  Component Value Date   HGBA1C 6.9 (H) 05/02/2022   Urinalysis    Component Value Date/Time   COLORURINE YELLOW 04/02/2022 1023   APPEARANCEUR Cloudy (A) 04/02/2022 1023   APPEARANCEUR Clear 05/27/2019 1002   LABSPEC 1.010 04/02/2022 1023   LABSPEC 1.016 12/26/2011 1844   PHURINE 6.0 04/02/2022 1023   GLUCOSEU NEGATIVE 04/02/2022 1023   HGBUR MODERATE (A) 04/02/2022 1023   BILIRUBINUR NEGATIVE 04/02/2022 1023   BILIRUBINUR negative 05/21/2021 1337   BILIRUBINUR Negative 05/27/2019  1002   BILIRUBINUR Negative 12/26/2011 1844   KETONESUR NEGATIVE 04/02/2022 1023   PROTEINUR Positive (A) 05/21/2021 1337   PROTEINUR NEGATIVE 11/09/2020 1425   UROBILINOGEN 0.2 04/02/2022 1023   NITRITE NEGATIVE 04/02/2022 1023   LEUKOCYTESUR LARGE (A) 04/02/2022 1023   LEUKOCYTESUR 1+ 12/26/2011 1844   Urine Drug Screen No results found for: "LABOPIA", "COCAINSCRNUR", "LABBENZ", "AMPHETMU", "THCU", "LABBARB"  Alcohol Level    Component Value Date/Time  ETH <10 05/01/2022 1437     SIGNIFICANT DIAGNOSTIC STUDIES CT HEAD WO CONTRAST (5MM)  Result Date: 05/02/2022 CLINICAL DATA:  Stroke follow-up EXAM: CT HEAD WITHOUT CONTRAST TECHNIQUE: Contiguous axial images were obtained from the base of the skull through the vertex without intravenous contrast. RADIATION DOSE REDUCTION: This exam was performed according to the departmental dose-optimization program which includes automated exposure control, adjustment of the mA and/or kV according to patient size and/or use of iterative reconstruction technique. COMPARISON:  None Available. FINDINGS: Brain: Early cytotoxic edema throughout most of the right MCA territory. No acute hemorrhage. No midline shift. Generalized atrophy with findings chronic microvascular ischemia Vascular: No abnormal hyperdensity of the major intracranial arteries or dural venous sinuses. No intracranial atherosclerosis. Skull: The visualized skull base, calvarium and extracranial soft tissues are normal. Sinuses/Orbits: No fluid levels or advanced mucosal thickening of the visualized paranasal sinuses. No mastoid or middle ear effusion. The orbits are normal. IMPRESSION: 1. Early cytotoxic edema throughout most of the right MCA territory, consistent with early subacute infarct. 2. No acute hemorrhage or midline shift. Electronically Signed   By: Ulyses Jarred M.D.   On: 05/02/2022 19:49   IR PERCUTANEOUS ART THROMBECTOMY/INFUSION INTRACRANIAL INC DIAG ANGIO  Result Date:  05/02/2022 INDICATION: DYAMOND TOLOSA is a 88 year old female presenting with left facial droop, left hemiplegia, left hemineglect, right gaze deviation and dysarthria; NIHSS 19. Her last known well was 13:20 on 05/01/2022. Her past medical history is significant for atrial fibrillation, CAD, DM, glaucoma, pacemaker, HLD, HTN, anemia and OSA.; baseline modified Rankin scale 1-2. Head CT showed no lartge acute territorial infarct or hemorrhage. Patient received IV TNK. CTA angiogram of the head and neck showed occlusion of the distal right M1/MCA, mid P2/PCA and right A3-A4/ACA. Patient was then transferred to our servicefor mechanical thrombectomy. EXAM: ULTRASOUND GUIDED VASCULAR ACCESS DIAGNOSTIC CEREBRAL ANGIOGRAM MECHANICAL THROMBECTOMY (ANTERIOR CIRCULATION) MECHANICAL THROMBECTOMY (POSTERIOR CIRCULATION) FLAT PANEL HEAD CT COMPARISON:  CT/CT angiogram head neck May 01, 2022. MEDICATIONS: No antibiotics administered. ANESTHESIA/SEDATION: The procedure was performed under general anesthesia. CONTRAST:  90 mL of Omnipaque FLUOROSCOPY: Radiation Exposure Index (as provided by the fluoroscopic device): 202 mGy Kerma COMPLICATIONS: None immediate. TECHNIQUE: Informed written consent was obtained from the patient after a thorough discussion of the procedural risks, benefits and alternatives. All questions were addressed. Maximal Sterile Barrier Technique was utilized including caps, mask, sterile gowns, sterile gloves, sterile drape, hand hygiene and skin antiseptic. A timeout was performed prior to the initiation of the procedure. The right groin was prepped and draped in the usual sterile fashion. Using a micropuncture kit and the modified Seldinger technique, access was gained to the right common femoral artery and an 8 French sheath was placed. Real-time ultrasound guidance was utilized for vascular access including the acquisition of a permanent ultrasound image documenting patency of the accessed vessel.  Under fluoroscopy, a Zoom 88 guide catheter was navigated over a 6 [place] VTK catheter and a 0.035" Terumo Glidewire into the aortic arch. The catheter was placed into the right common carotid artery and then advanced into the right internal carotid artery. The diagnostic catheter was removed. Frontal and lateral angiograms of the head were obtained. FINDINGS: 1. There is an occlusion of the mid/distal right M1/MCA. 2. Occlusion of the P2 segment of the fetal right PCA. 3. Patent proximal right ACA with occlusion at the A5 segment. PROCEDURE: Using biplane roadmap guidance, a Freeclimb 70 aspiration catheter was navigated over a Tenzing delivery catheter into  the right M1/MCA. The aspiration catheter was then advanced to the level of occlusion and connected to an aspiration pump. Continuous aspiration was performed for 2 minutes. The guide catheter was connected to a VacLok syringe. The aspiration catheter was subsequently removed under constant aspiration. The guide catheter was aspirated for debris. Right internal carotid artery angiograms with frontal lateral views of the head showed recanalization of the segment with persistent occlusion of the distal right M3/MCA posterior division branch. Using biplane roadmap guidance, a Zoom 35 aspiration catheter was navigated over an Aristotle 24 microguidewire into the cavernous segment of the right ICA. The aspiration catheter was then advanced to the level of occlusion and connected to an aspiration pump. Continuous aspiration was performed for 2 minutes. The guide catheter was connected to a VacLok syringe. The aspiration catheter was subsequently removed under constant aspiration. The guide catheter was aspirated for debris. Right internal carotid artery angiograms with frontal lateral views of the head showed complete recanalization of the right MCA vascular tree (TICI 3). Persistent occlusion of the P2/PCA segment was seen. Using biplane roadmap guidance, a Zoom 35  aspiration catheter was navigated over an Aristotle 24 microguidewire into the cavernous segment of the right ICA. The aspiration catheter was then advanced over the wire to the level of occlusion in the right P2 segment and connected to an aspiration pump. Continuous aspiration was performed for 2 minutes. The guide catheter was connected to a VacLok syringe. The aspiration catheter was subsequently removed under constant aspiration. The guide catheter was aspirated for debris. Right internal carotid artery angiograms with frontal lateral views of the head showed persistent occlusion of the right P2/PCA segment. Using biplane roadmap guidance, a Zoom 55 aspiration catheter was navigated over an Aristotle 24 microguidewire into the cavernous segment of the right ICA. The aspiration catheter was then advanced over the wire to the level of occlusion in the right P2 segment and connected to an aspiration pump. Continuous aspiration was performed for 2 minutes. The guide catheter was connected to a VacLok syringe. The aspiration catheter was subsequently removed under constant aspiration. The guide catheter was aspirated for debris. Right internal carotid artery angiogram showed complete recanalization of the fetal right PCA. No embolus to new territory seen. Flat panel CT of the head was obtained and post processed in a separate workstation with concurrent attending physician supervision. Selected images were sent to PACS. No evidence of hemorrhagic complication. Right common femoral artery angiogram was obtained in right anterior oblique view. The puncture is at the level of the common femoral artery. Diffuse atherosclerotic the right femoral and iliac arteries with preserved caliber, adequate for closure device. The sheath was exchanged over the wire for an 8 [place] Angio-Seal which was utilized for access closure. Immediate hemostasis was achieved. IMPRESSION: 1. Mechanical thrombectomy performed for treatment of a  distal right M1/MCA and fetal right P2/PCA occlusions achieving complete recanalization in both territories (TICI 3). 2. No embolus to new territory. 3. No hemorrhagic complication. PLAN: Transfer to ICU for continued care. Electronically Signed   By: Pedro Earls M.D.   On: 05/02/2022 11:52   IR US Guide Vasc Access Right  Result Date: 05/02/2022 INDICATION: LEIDY MASSAR is a 88 year old female presenting with left facial droop, left hemiplegia, left hemineglect, right gaze deviation and dysarthria; NIHSS 19. Her last known well was 13:20 on 05/01/2022. Her past medical history is significant for atrial fibrillation, CAD, DM, glaucoma, pacemaker, HLD, HTN, anemia and OSA.;  baseline modified Rankin scale 1-2. Head CT showed no lartge acute territorial infarct or hemorrhage. Patient received IV TNK. CTA angiogram of the head and neck showed occlusion of the distal right M1/MCA, mid P2/PCA and right A3-A4/ACA. Patient was then transferred to our servicefor mechanical thrombectomy. EXAM: ULTRASOUND GUIDED VASCULAR ACCESS DIAGNOSTIC CEREBRAL ANGIOGRAM MECHANICAL THROMBECTOMY (ANTERIOR CIRCULATION) MECHANICAL THROMBECTOMY (POSTERIOR CIRCULATION) FLAT PANEL HEAD CT COMPARISON:  CT/CT angiogram head neck May 01, 2022. MEDICATIONS: No antibiotics administered. ANESTHESIA/SEDATION: The procedure was performed under general anesthesia. CONTRAST:  90 mL of Omnipaque FLUOROSCOPY: Radiation Exposure Index (as provided by the fluoroscopic device): 951 mGy Kerma COMPLICATIONS: None immediate. TECHNIQUE: Informed written consent was obtained from the patient after a thorough discussion of the procedural risks, benefits and alternatives. All questions were addressed. Maximal Sterile Barrier Technique was utilized including caps, mask, sterile gowns, sterile gloves, sterile drape, hand hygiene and skin antiseptic. A timeout was performed prior to the initiation of the procedure. The right groin was prepped  and draped in the usual sterile fashion. Using a micropuncture kit and the modified Seldinger technique, access was gained to the right common femoral artery and an 8 French sheath was placed. Real-time ultrasound guidance was utilized for vascular access including the acquisition of a permanent ultrasound image documenting patency of the accessed vessel. Under fluoroscopy, a Zoom 88 guide catheter was navigated over a 6 [place] VTK catheter and a 0.035" Terumo Glidewire into the aortic arch. The catheter was placed into the right common carotid artery and then advanced into the right internal carotid artery. The diagnostic catheter was removed. Frontal and lateral angiograms of the head were obtained. FINDINGS: 1. There is an occlusion of the mid/distal right M1/MCA. 2. Occlusion of the P2 segment of the fetal right PCA. 3. Patent proximal right ACA with occlusion at the A5 segment. PROCEDURE: Using biplane roadmap guidance, a Freeclimb 70 aspiration catheter was navigated over a Tenzing delivery catheter into the right M1/MCA. The aspiration catheter was then advanced to the level of occlusion and connected to an aspiration pump. Continuous aspiration was performed for 2 minutes. The guide catheter was connected to a VacLok syringe. The aspiration catheter was subsequently removed under constant aspiration. The guide catheter was aspirated for debris. Right internal carotid artery angiograms with frontal lateral views of the head showed recanalization of the segment with persistent occlusion of the distal right M3/MCA posterior division branch. Using biplane roadmap guidance, a Zoom 35 aspiration catheter was navigated over an Aristotle 24 microguidewire into the cavernous segment of the right ICA. The aspiration catheter was then advanced to the level of occlusion and connected to an aspiration pump. Continuous aspiration was performed for 2 minutes. The guide catheter was connected to a VacLok syringe. The  aspiration catheter was subsequently removed under constant aspiration. The guide catheter was aspirated for debris. Right internal carotid artery angiograms with frontal lateral views of the head showed complete recanalization of the right MCA vascular tree (TICI 3). Persistent occlusion of the P2/PCA segment was seen. Using biplane roadmap guidance, a Zoom 35 aspiration catheter was navigated over an Aristotle 24 microguidewire into the cavernous segment of the right ICA. The aspiration catheter was then advanced over the wire to the level of occlusion in the right P2 segment and connected to an aspiration pump. Continuous aspiration was performed for 2 minutes. The guide catheter was connected to a VacLok syringe. The aspiration catheter was subsequently removed under constant aspiration. The guide catheter was aspirated for debris. Right  internal carotid artery angiograms with frontal lateral views of the head showed persistent occlusion of the right P2/PCA segment. Using biplane roadmap guidance, a Zoom 55 aspiration catheter was navigated over an Aristotle 24 microguidewire into the cavernous segment of the right ICA. The aspiration catheter was then advanced over the wire to the level of occlusion in the right P2 segment and connected to an aspiration pump. Continuous aspiration was performed for 2 minutes. The guide catheter was connected to a VacLok syringe. The aspiration catheter was subsequently removed under constant aspiration. The guide catheter was aspirated for debris. Right internal carotid artery angiogram showed complete recanalization of the fetal right PCA. No embolus to new territory seen. Flat panel CT of the head was obtained and post processed in a separate workstation with concurrent attending physician supervision. Selected images were sent to PACS. No evidence of hemorrhagic complication. Right common femoral artery angiogram was obtained in right anterior oblique view. The puncture is  at the level of the common femoral artery. Diffuse atherosclerotic the right femoral and iliac arteries with preserved caliber, adequate for closure device. The sheath was exchanged over the wire for an 8 [place] Angio-Seal which was utilized for access closure. Immediate hemostasis was achieved. IMPRESSION: 1. Mechanical thrombectomy performed for treatment of a distal right M1/MCA and fetal right P2/PCA occlusions achieving complete recanalization in both territories (TICI 3). 2. No embolus to new territory. 3. No hemorrhagic complication. PLAN: Transfer to ICU for continued care. Electronically Signed   By: Pedro Earls M.D.   On: 05/02/2022 11:52   IR CT Head Ltd  Result Date: 05/02/2022 INDICATION: CHARIZMA GARDINER is a 88 year old female presenting with left facial droop, left hemiplegia, left hemineglect, right gaze deviation and dysarthria; NIHSS 19. Her last known well was 13:20 on 05/01/2022. Her past medical history is significant for atrial fibrillation, CAD, DM, glaucoma, pacemaker, HLD, HTN, anemia and OSA.; baseline modified Rankin scale 1-2. Head CT showed no lartge acute territorial infarct or hemorrhage. Patient received IV TNK. CTA angiogram of the head and neck showed occlusion of the distal right M1/MCA, mid P2/PCA and right A3-A4/ACA. Patient was then transferred to our servicefor mechanical thrombectomy. EXAM: ULTRASOUND GUIDED VASCULAR ACCESS DIAGNOSTIC CEREBRAL ANGIOGRAM MECHANICAL THROMBECTOMY (ANTERIOR CIRCULATION) MECHANICAL THROMBECTOMY (POSTERIOR CIRCULATION) FLAT PANEL HEAD CT COMPARISON:  CT/CT angiogram head neck May 01, 2022. MEDICATIONS: No antibiotics administered. ANESTHESIA/SEDATION: The procedure was performed under general anesthesia. CONTRAST:  90 mL of Omnipaque FLUOROSCOPY: Radiation Exposure Index (as provided by the fluoroscopic device): 638 mGy Kerma COMPLICATIONS: None immediate. TECHNIQUE: Informed written consent was obtained from the patient  after a thorough discussion of the procedural risks, benefits and alternatives. All questions were addressed. Maximal Sterile Barrier Technique was utilized including caps, mask, sterile gowns, sterile gloves, sterile drape, hand hygiene and skin antiseptic. A timeout was performed prior to the initiation of the procedure. The right groin was prepped and draped in the usual sterile fashion. Using a micropuncture kit and the modified Seldinger technique, access was gained to the right common femoral artery and an 8 French sheath was placed. Real-time ultrasound guidance was utilized for vascular access including the acquisition of a permanent ultrasound image documenting patency of the accessed vessel. Under fluoroscopy, a Zoom 88 guide catheter was navigated over a 6 [place] VTK catheter and a 0.035" Terumo Glidewire into the aortic arch. The catheter was placed into the right common carotid artery and then advanced into the right internal carotid artery. The  diagnostic catheter was removed. Frontal and lateral angiograms of the head were obtained. FINDINGS: 1. There is an occlusion of the mid/distal right M1/MCA. 2. Occlusion of the P2 segment of the fetal right PCA. 3. Patent proximal right ACA with occlusion at the A5 segment. PROCEDURE: Using biplane roadmap guidance, a Freeclimb 70 aspiration catheter was navigated over a Tenzing delivery catheter into the right M1/MCA. The aspiration catheter was then advanced to the level of occlusion and connected to an aspiration pump. Continuous aspiration was performed for 2 minutes. The guide catheter was connected to a VacLok syringe. The aspiration catheter was subsequently removed under constant aspiration. The guide catheter was aspirated for debris. Right internal carotid artery angiograms with frontal lateral views of the head showed recanalization of the segment with persistent occlusion of the distal right M3/MCA posterior division branch. Using biplane roadmap  guidance, a Zoom 35 aspiration catheter was navigated over an Aristotle 24 microguidewire into the cavernous segment of the right ICA. The aspiration catheter was then advanced to the level of occlusion and connected to an aspiration pump. Continuous aspiration was performed for 2 minutes. The guide catheter was connected to a VacLok syringe. The aspiration catheter was subsequently removed under constant aspiration. The guide catheter was aspirated for debris. Right internal carotid artery angiograms with frontal lateral views of the head showed complete recanalization of the right MCA vascular tree (TICI 3). Persistent occlusion of the P2/PCA segment was seen. Using biplane roadmap guidance, a Zoom 35 aspiration catheter was navigated over an Aristotle 24 microguidewire into the cavernous segment of the right ICA. The aspiration catheter was then advanced over the wire to the level of occlusion in the right P2 segment and connected to an aspiration pump. Continuous aspiration was performed for 2 minutes. The guide catheter was connected to a VacLok syringe. The aspiration catheter was subsequently removed under constant aspiration. The guide catheter was aspirated for debris. Right internal carotid artery angiograms with frontal lateral views of the head showed persistent occlusion of the right P2/PCA segment. Using biplane roadmap guidance, a Zoom 55 aspiration catheter was navigated over an Aristotle 24 microguidewire into the cavernous segment of the right ICA. The aspiration catheter was then advanced over the wire to the level of occlusion in the right P2 segment and connected to an aspiration pump. Continuous aspiration was performed for 2 minutes. The guide catheter was connected to a VacLok syringe. The aspiration catheter was subsequently removed under constant aspiration. The guide catheter was aspirated for debris. Right internal carotid artery angiogram showed complete recanalization of the fetal right  PCA. No embolus to new territory seen. Flat panel CT of the head was obtained and post processed in a separate workstation with concurrent attending physician supervision. Selected images were sent to PACS. No evidence of hemorrhagic complication. Right common femoral artery angiogram was obtained in right anterior oblique view. The puncture is at the level of the common femoral artery. Diffuse atherosclerotic the right femoral and iliac arteries with preserved caliber, adequate for closure device. The sheath was exchanged over the wire for an 8 [place] Angio-Seal which was utilized for access closure. Immediate hemostasis was achieved. IMPRESSION: 1. Mechanical thrombectomy performed for treatment of a distal right M1/MCA and fetal right P2/PCA occlusions achieving complete recanalization in both territories (TICI 3). 2. No embolus to new territory. 3. No hemorrhagic complication. PLAN: Transfer to ICU for continued care. Electronically Signed   By: Pedro Earls M.D.   On: 05/02/2022 11:52  ECHOCARDIOGRAM COMPLETE  Result Date: 05/02/2022    ECHOCARDIOGRAM REPORT   Patient Name:   MEGAN PRESTI Date of Exam: 05/02/2022 Medical Rec #:  [id number]     Height:       61.0 in Accession #:    [id number]    Weight:       133.2 lb Date of Birth:  1923-03-03      BSA:          1.589 m Patient Age:    84 years      BP:           128/73 mmHg Patient Gender: F             HR:           103 bpm. Exam Location:  Inpatient Procedure: 2D Echo, Cardiac Doppler, Color Doppler and Intracardiac            Opacification Agent Indications:    Stroke  History:        Patient has prior history of Echocardiogram examinations, most                 recent 11/26/2021. CAD, Pacemaker, Arrythmias:Atrial                 Fibrillation; Risk Factors:Diabetes, Hypertension, Dyslipidemia                 and Sleep Apnea. Anemia.  Sonographer:    Eartha Inch Referring Phys: [id number] ERIC LINDZEN  Sonographer Comments: Technically  difficult study due to poor echo windows. Image acquisition challenging due to patient body habitus and Image acquisition challenging due to respiratory motion. IMPRESSIONS  1. Left ventricular ejection fraction, by estimation, is 60 to 65%. The left ventricle has normal function. The left ventricle has no regional wall motion abnormalities. There is mild left ventricular hypertrophy. Left ventricular diastolic parameters are indeterminate.  2. Right ventricular systolic function is mildly reduced. The right ventricular size is normal. There is normal pulmonary artery systolic pressure. The estimated right ventricular systolic pressure is 11.7 mmHg.  3. Left atrial size was severely dilated.  4. Right atrial size was mildly dilated.  5. The mitral valve is abnormal. Mild mitral valve regurgitation. No evidence of mitral stenosis. Moderate mitral annular calcification.  6. Tricuspid valve regurgitation is moderate to severe. Visually TR appears moderate, but does appear to be systolic flow reversal on hepatic vein doppler suggesting possible severe TR  7. The aortic valve is tricuspid. Aortic valve regurgitation is not visualized. Aortic valve sclerosis/calcification is present, without any evidence of aortic stenosis.  8. The inferior vena cava is dilated in size with <50% respiratory variability, suggesting right atrial pressure of 15 mmHg. FINDINGS  Left Ventricle: Left ventricular ejection fraction, by estimation, is 60 to 65%. The left ventricle has normal function. The left ventricle has no regional wall motion abnormalities. Definity contrast agent was given IV to delineate the left ventricular  endocardial borders. The left ventricular internal cavity size was normal in size. There is mild left ventricular hypertrophy. Left ventricular diastolic parameters are indeterminate. Right Ventricle: The right ventricular size is normal. No increase in right ventricular wall thickness. Right ventricular systolic  function is mildly reduced. There is normal pulmonary artery systolic pressure. The tricuspid regurgitant velocity is 2.00 m/s, and with an assumed right atrial pressure of 15 mmHg, the estimated right ventricular systolic pressure is 35.6 mmHg. Left Atrium: Left atrial size was severely dilated. Right Atrium: Right atrial size  was mildly dilated. Pericardium: There is no evidence of pericardial effusion. Mitral Valve: The mitral valve is abnormal. Moderate mitral annular calcification. Mild mitral valve regurgitation. No evidence of mitral valve stenosis. MV peak gradient, 5.5 mmHg. The mean mitral valve gradient is 2.0 mmHg. Tricuspid Valve: The tricuspid valve is normal in structure. Tricuspid valve regurgitation is moderate. Aortic Valve: The aortic valve is tricuspid. Aortic valve regurgitation is not visualized. Aortic valve sclerosis/calcification is present, without any evidence of aortic stenosis. Pulmonic Valve: The pulmonic valve was grossly normal. Pulmonic valve regurgitation is trivial. Aorta: The aortic root and ascending aorta are structurally normal, with no evidence of dilitation. Venous: The inferior vena cava is dilated in size with less than 50% respiratory variability, suggesting right atrial pressure of 15 mmHg. IAS/Shunts: The interatrial septum was not well visualized.  LEFT VENTRICLE PLAX 2D LVIDd:         4.20 cm   Diastology LVIDs:         2.60 cm   LV e' medial:    5.87 cm/s LV PW:         1.30 cm   LV E/e' medial:  17.6 LV IVS:        1.10 cm   LV e' lateral:   6.20 cm/s LVOT diam:     2.10 cm   LV E/e' lateral: 16.7 LVOT Area:     3.46 cm  RIGHT VENTRICLE            IVC RV S prime:     8.49 cm/s  IVC diam: 2.60 cm TAPSE (M-mode): 1.2 cm LEFT ATRIUM             Index        RIGHT ATRIUM           Index LA diam:        4.70 cm 2.96 cm/m   RA Area:     21.50 cm LA Vol (A2C):   67.2 ml 42.29 ml/m  RA Volume:   57.90 ml  36.44 ml/m LA Vol (A4C):   88.0 ml 55.38 ml/m LA Biplane Vol:  78.6 ml 49.47 ml/m   AORTA Ao Root diam: 3.30 cm Ao Asc diam:  3.50 cm MITRAL VALVE                TRICUSPID VALVE MV Area (PHT): 3.94 cm     TR Peak grad:   16.0 mmHg MV Peak grad:  5.5 mmHg     TR Mean grad:   12.0 mmHg MV Mean grad:  2.0 mmHg     TR Vmax:        200.00 cm/s MV Vmax:       1.17 m/s     TR Vmean:       166.0 cm/s MV Vmean:      72.8 cm/s MV Decel Time: 193 msec     SHUNTS MV E velocity: 103.50 cm/s  Systemic Diam: 2.10 cm Oswaldo Milian MD Electronically signed by Oswaldo Milian MD Signature Date/Time: 05/02/2022/10:31:01 AM    Final    CT ANGIO HEAD NECK W WO CM (CODE STROKE)  Result Date: 05/01/2022 CLINICAL DATA:  Stroke follow-up EXAM: CT ANGIOGRAPHY HEAD AND NECK TECHNIQUE: Multidetector CT imaging of the head and neck was performed using the standard protocol during bolus administration of intravenous contrast. Multiplanar CT image reconstructions and MIPs were obtained to evaluate the vascular anatomy. Carotid stenosis measurements (when applicable) are obtained utilizing NASCET criteria, using the distal  internal carotid diameter as the denominator. RADIATION DOSE REDUCTION: This exam was performed according to the departmental dose-optimization program which includes automated exposure control, adjustment of the mA and/or kV according to patient size and/or use of iterative reconstruction technique. CONTRAST:  49m OMNIPAQUE IOHEXOL 350 MG/ML SOLN COMPARISON:  None Available. FINDINGS: CT HEAD FINDINGS Please see same-day CT brain for intracranial findings. Review of the MIP images confirms the above findings CTA NECK FINDINGS Aortic arch: Standard branching. Imaged portion shows no evidence of aneurysm or dissection. No significant stenosis of the major arch vessel origins. Right carotid system: No evidence of dissection, stenosis (50% or greater), or occlusion. Left carotid system: No evidence of dissection, stenosis (50% or greater), or occlusion. Vertebral arteries:  Codominant. No evidence of dissection, stenosis (50% or greater), or occlusion. Skeleton: Negative. Other neck: Negative. Upper chest: Small left pleural effusion. Partially visualized cardiac device in place. Review of the MIP images confirms the above findings CTA HEAD FINDINGS Anterior circulation: There is thrombus of the right M1/M2 junction with associated occlusion and nearly absent arborization in the right MCA territory. There is thrombus in an acute occlusion in the distal A2 segment of the right ACA (series 7, image 58). Posterior circulation: There is a fetal PCA on the right with the P2 segment supplied by the right PCOM. The P2 segment of the right PCA is focally occluded (series 7, image 104). Venous sinuses: As permitted by contrast timing, patent. Anatomic variants: Fetal PCA on the right. Review of the MIP images confirms the above findings IMPRESSION: 1. Acute occlusion at the right M1/M2 junction with nearly absent arborization in the right MCA territory. 2. Thrombus and acute occlusion of the distal A2 segment of the right ACA. 3. Focal occlusion of the P2 segment of the right PCA. 4. No hemodynamically significant stenosis in the neck. 5. Small bilateral pleural effusions. Findings were discussed with Dr. LCheral Markeron 05/01/22 at 2:44 PM. Electronically Signed   By: HMarin RobertsM.D.   On: 05/01/2022 15:02   CT HEAD CODE STROKE WO CONTRAST  Result Date: 05/01/2022 CLINICAL DATA:  Code stroke.  No history provided. EXAM: CT HEAD WITHOUT CONTRAST TECHNIQUE: Contiguous axial images were obtained from the base of the skull through the vertex without intravenous contrast. RADIATION DOSE REDUCTION: This exam was performed according to the departmental dose-optimization program which includes automated exposure control, adjustment of the mA and/or kV according to patient size and/or use of iterative reconstruction technique. COMPARISON:  CT Brain 06/09/19 FINDINGS: Brain: Age-indeterminate,  possibly acute infarct in the right cerebellum. No hemorrhage. No hydrocephalus. No extra-axial fluid collection. Vascular: Dense atherosclerotic calcifications involving the bilateral V4 segments of the vertebral arteries. Skull: Normal. Negative for fracture or focal lesion. Sinuses/Orbits: No acute finding. Other: None. ASPECTS ([facility] Early CT Score): 10 IMPRESSION: Age-indeterminate, possibly acute infarct in the right cerebellum. No hemorrhage. Findings were paged to Dr. LCheral Markeron 05/01/22 at 2:26 PM via [facility]. Electronically Signed   By: HMarin RobertsM.D.   On: 05/01/2022 14:27       HISTORY OF PRESENT ILLNESS The 88year-old patient with tree of atrial fibrillation not on anticoagulation, CAD, diabetes, glaucoma, pacemaker, hypertension, hyperlipidemia, anemia and sleep apnea presented from her assisted living facility with acute onset left facial droop, left hemiplegia left hemineglect, right gaze deviation and dysarthria.  HOSPITAL COURSE Patient was found to have acute occlusion of right M1 M2 junction, was given TNK and taken for emergent mechanical thrombectomy.  TICI 3 flow was achieved.  Of note, she had been on Coumadin in the past but stopped taking this medication after a fall 2 years ago which resulted in a small ICH.  Risks and benefits were discussed with patient and family, and it was decided to start her on Eliquis.  She will be discharged to [facility] for ongoing rehabilitation.  DISCHARGE EXAM Blood pressure (!) 151/70, pulse 87, temperature 98.2 F (36.8 C), temperature source Oral, resp. rate 20, height _0  (1.549 m), weight 60.4 kg, SpO2 92 %.  General: Frail appearing elderly patient in no acute distress Respiratory: Regular, unlabored respirations on room air  NEURO:  Mental Status: AA&Ox3  Speech/Language: speech is without dysarthria or aphasia.    Cranial Nerves:  II: PERRL.  III, IV, VI: EOMI. Eyelids elevate symmetrically.  V:  Sensation is intact to light touch and symmetrical to face.  VII: Smile is symmetrical.  VIII: hearing intact to voice. IX, X: Phonation is normal.  DV:VOHYWVPX shrug stronger on right than left XII: tongue is midline without fasciculations. Motor: 5/5 strength to right upper extremity, 2 out of 5 strength to bilateral lower extremities and left upper extremity Tone: is normal and bulk is normal Sensation- Intact to light touch bilaterally.  Gait- deferred   Discharge Diet      Diet   DIET DYS 3 Room service appropriate? Yes with Assist; Fluid consistency: Thin   liquids  DISCHARGE PLAN Disposition:  Transfer to [facility] for ongoing PT, OT and ST Eliquis (apixaban) daily for secondary stroke prevention Recommend ongoing stroke risk factor control by Primary Care Physician at time of discharge from inpatient rehabilitation. Follow-up PCP Crecencio Mc, MD in 2 weeks following discharge from rehab. Follow-up in [facility] in 8 weeks following discharge from rehab, office to schedule an appointment.   32 minutes were spent preparing discharge.  [place] , MSN, AGACNP-BC [facility] See Amion for schedule and pager information 05/05/2022 1:28 PM   I have personally obtained history,examined this patient, reviewed notes, independently viewed imaging studies, participated in medical decision making and plan of care.ROS completed by me personally and pertinent positives fully documented  I have made any additions or clarifications directly to the above note. Agree with note above.    Antony Contras, MD Medical Director [facility] Pager: [phone] 05/05/2022 3:31 PM

## 2022-05-06 DIAGNOSIS — I63511 Cerebral infarction due to unspecified occlusion or stenosis of right middle cerebral artery: Secondary | ICD-10-CM | POA: Diagnosis not present

## 2022-05-06 LAB — CBC WITH DIFFERENTIAL/PLATELET
Abs Immature Granulocytes: 0.02 10*3/uL (ref 0.00–0.07)
Basophils Absolute: 0 10*3/uL (ref 0.0–0.1)
Basophils Relative: 0 %
Eosinophils Absolute: 0 10*3/uL (ref 0.0–0.5)
Eosinophils Relative: 1 %
HCT: 37.3 % (ref 36.0–46.0)
Hemoglobin: 12.8 g/dL (ref 12.0–15.0)
Immature Granulocytes: 0 %
Lymphocytes Relative: 35 %
Lymphs Abs: 2.8 10*3/uL (ref 0.7–4.0)
MCH: 32.7 pg (ref 26.0–34.0)
MCHC: 34.3 g/dL (ref 30.0–36.0)
MCV: 95.2 fL (ref 80.0–100.0)
Monocytes Absolute: 0.7 10*3/uL (ref 0.1–1.0)
Monocytes Relative: 8 %
Neutro Abs: 4.5 10*3/uL (ref 1.7–7.7)
Neutrophils Relative %: 56 %
Platelets: 162 10*3/uL (ref 150–400)
RBC: 3.92 MIL/uL (ref 3.87–5.11)
RDW: 12.9 % (ref 11.5–15.5)
WBC: 8 10*3/uL (ref 4.0–10.5)
nRBC: 0 % (ref 0.0–0.2)

## 2022-05-06 LAB — COMPREHENSIVE METABOLIC PANEL
ALT: 14 U/L (ref 0–44)
AST: 30 U/L (ref 15–41)
Albumin: 2.9 g/dL — ABNORMAL LOW (ref 3.5–5.0)
Alkaline Phosphatase: 45 U/L (ref 38–126)
Anion gap: 11 (ref 5–15)
BUN: 14 mg/dL (ref 8–23)
CO2: 30 mmol/L (ref 22–32)
Calcium: 8.7 mg/dL — ABNORMAL LOW (ref 8.9–10.3)
Chloride: 96 mmol/L — ABNORMAL LOW (ref 98–111)
Creatinine, Ser: 0.88 mg/dL (ref 0.44–1.00)
GFR, Estimated: 60 mL/min — ABNORMAL LOW (ref >60)
Glucose, Bld: 140 mg/dL — ABNORMAL HIGH (ref 70–99)
Potassium: 3.5 mmol/L (ref 3.5–5.1)
Sodium: 137 mmol/L (ref 135–145)
Total Bilirubin: 2.1 mg/dL — ABNORMAL HIGH (ref 0.3–1.2)
Total Protein: 5.6 g/dL — ABNORMAL LOW (ref 6.5–8.1)

## 2022-05-06 LAB — GLUCOSE, CAPILLARY
Glucose-Capillary: 137 mg/dL — ABNORMAL HIGH (ref 70–99)
Glucose-Capillary: 152 mg/dL — ABNORMAL HIGH (ref 70–99)
Glucose-Capillary: 146 mg/dL — ABNORMAL HIGH (ref 70–99)
Glucose-Capillary: 188 mg/dL — ABNORMAL HIGH (ref 70–99)

## 2022-05-06 NOTE — Plan of Care (Signed)
  Problem: RH Swallowing Goal: LTG Pt will demonstrate functional change in swallow as evidenced by bedside/clinical objective assessment (SLP) Description: LTG: Patient will demonstrate functional change in swallow as evidenced by bedside/clinical objective assessment (SLP) Flowsheets (Taken 05/06/2022 1204) LTG: Patient will demonstrate functional change in swallow as evidenced by bedside/clinical objective assessment: Oropharyngeal swallow   Problem: RH Cognition - SLP Goal: RH LTG Patient will demonstrate orientation with cues Description:  LTG:  Patient will demonstrate orientation to person/place/time/situation with cues (SLP)   Flowsheets (Taken 05/06/2022 1204) LTG Patient will demonstrate orientation to:  Person  Place  Time  Situation LTG: Patient will demonstrate orientation using cueing (SLP): Supervision   Problem: RH Problem Solving Goal: LTG Patient will demonstrate problem solving for (SLP) Description: LTG:  Patient will demonstrate problem solving for basic/complex daily situations with cues  (SLP) Flowsheets (Taken 05/06/2022 1204) LTG: Patient will demonstrate problem solving for (SLP): Basic daily situations LTG Patient will demonstrate problem solving for: Minimal Assistance - Patient > 75%   Problem: RH Memory Goal: LTG Patient will use memory compensatory aids to (SLP) Description: LTG:  Patient will use memory compensatory aids to recall biographical/new, daily complex information with cues (SLP) Flowsheets (Taken 05/06/2022 1204) LTG: Patient will use memory compensatory aids to (SLP): Minimal Assistance - Patient > 75%   Problem: RH Awareness Goal: LTG: Patient will demonstrate awareness during functional activites type of (SLP) Description: LTG: Patient will demonstrate awareness during functional activites type of (SLP) Flowsheets (Taken 05/06/2022 1204) Patient will demonstrate during cognitive/linguistic activities awareness type of: Emergent LTG:  Patient will demonstrate awareness during cognitive/linguistic activities with assistance of (SLP): Minimal Assistance - Patient > 75%

## 2022-05-06 NOTE — Discharge Instructions (Signed)
Inpatient Rehab Discharge Instructions  Kara [name] Discharge date and time: No discharge date for patient encounter.   Activities/Precautions/ Functional Status: Activity: activity as tolerated Diet: diabetic diet Wound Care: Routine skin checks Functional status:  ___ No restrictions     ___ Walk up steps independently ___ 24/7 supervision/assistance   ___ Walk up steps with assistance ___ Intermittent supervision/assistance  ___ Bathe/dress independently ___ Walk with walker     _x__ Bathe/dress with assistance ___ Walk Independently    ___ Shower independently ___ Walk with assistance    ___ Shower with assistance ___ No alcohol     ___ Return to work/school ________  Special Instructions:  No driving smoking or alcoholSTROKE/TIA DISCHARGE INSTRUCTIONS SMOKING Cigarette smoking nearly doubles your risk of having a stroke & is the single most alterable risk factor  If you smoke or have smoked in the last 12 months, you are advised to quit smoking for your health. Most of the excess cardiovascular risk related to smoking disappears within a year of stopping. Ask you doctor about anti-smoking medications [place] Quit Line: 1-800-QUIT NOW Free Smoking Cessation Classes [phone]  CHOLESTEROL Know your levels; limit fat & cholesterol in your diet  Lipid Panel     Component Value Date/Time   CHOL 122 05/02/2022 0619   TRIG 114 05/02/2022 0619   HDL 41 05/02/2022 0619   CHOLHDL 3.0 05/02/2022 0619   VLDL 23 05/02/2022 0619   LDLCALC 58 05/02/2022 0619     Many patients benefit from treatment even if their cholesterol is at goal. Goal: Total Cholesterol (CHOL) less than 160 Goal:  Triglycerides (TRIG) less than 150 Goal:  HDL greater than 40 Goal:  LDL (LDLCALC) less than 100   BLOOD PRESSURE American Stroke Association blood pressure target is less that 120/80 mm/Hg  Your discharge blood pressure is:  BP: (!) 158/80 Monitor your blood pressure Limit your salt and alcohol  intake Many individuals will require more than one medication for high blood pressure  DIABETES (A1c is a blood sugar average for last 3 months) Goal HGBA1c is under 7% (HBGA1c is blood sugar average for last 3 months)  Diabetes:    Lab Results  Component Value Date   HGBA1C 6.9 (H) 05/02/2022    Your HGBA1c can be lowered with medications, healthy diet, and exercise. Check your blood sugar as directed by your physician Call your physician if you experience unexplained or low blood sugars.  PHYSICAL ACTIVITY/REHABILITATION Goal is 30 minutes at least 4 days per week  Activity: Increase activity slowly, Therapies: Physical Therapy: Home Health Return to work:  Activity decreases your risk of heart attack and stroke and makes your heart stronger.  It helps control your weight and blood pressure; helps you relax and can improve your mood. Participate in a regular exercise program. Talk with your doctor about the best form of exercise for you (dancing, walking, swimming, cycling).  DIET/WEIGHT Goal is to maintain a healthy weight  Your discharge diet is:  Diet Order             DIET DYS 3 Room service appropriate? Yes with Assist; Fluid consistency: Thin  Diet effective now                   liquids Your height is:    Your current weight is: Weight: 56.7 kg Your Body Mass Index (BMI) is:  BMI (Calculated): 23.63 Following the type of diet specifically designed for you will help prevent  another stroke. Your goal weight range is:   Your goal Body Mass Index (BMI) is 19-24. Healthy food habits can help reduce 3 risk factors for stroke:  High cholesterol, hypertension, and excess weight.  RESOURCES Stroke/Support Group:  Call [phone]   STROKE EDUCATION PROVIDED/REVIEWED AND GIVEN TO PATIENT Stroke warning signs and symptoms How to activate emergency medical system (call 911). Medications prescribed at discharge. Need for follow-up after discharge. Personal risk factors for  stroke. Pneumonia vaccine given: No Flu vaccine given: No My questions have been answered, the writing is legible, and I understand these instructions.  I will adhere to these goals & educational materials that have been provided to me after my discharge from the hospital.     My questions have been answered and I understand these instructions. I will adhere to these goals and the provided educational materials after my discharge from the hospital.  Patient/Caregiver Signature _______________________________ Date __________  Clinician Signature _______________________________________ Date __________  Please bring this form and your medication list with you to all your follow-up doctor's appointments.

## 2022-05-06 NOTE — Progress Notes (Signed)
Patient continues to have a poor appetite. Daughter at bedside trying to assist and encourage PO intake. Patient states everything tastes bad. Patient declines to get OOB at this time. PT at bedside.

## 2022-05-06 NOTE — Progress Notes (Signed)
PROGRESS NOTE   Subjective/Complaints:  Poor appetite, food "does not taste right" ROS- neg for abd pain, occ nausea Objective:   No results found. Recent Labs    05/05/22 0816 05/06/22 0541  WBC 8.5 8.0  HGB 12.8 12.8  HCT 36.6 37.3  PLT 157 162   Recent Labs    05/05/22 0816 05/06/22 0541  NA 137 137  K 2.9* 3.5  CL 97* 96*  CO2 29 30  GLUCOSE 131* 140*  BUN 13 14  CREATININE 0.84 0.88  CALCIUM 8.3* 8.7*    Intake/Output Summary (Last 24 hours) at 05/06/2022 0841 Last data filed at 05/05/2022 1857 Gross per 24 hour  Intake 118 ml  Output --  Net 118 ml        Physical Exam: Vital Signs Blood pressure (!) 158/80, pulse 86, temperature 98.1 F (36.7 C), resp. rate 16, weight 56.7 kg, SpO2 92 %.  General: No acute distress Mood and affect are appropriate Heart: Regular rate and rhythm no rubs murmurs or extra sounds Lungs: Clear to auscultation, breathing unlabored, no rales or wheezes Abdomen: Positive bowel sounds, soft nontender to palpation, nondistended Extremities: edema and ecchymosis LUE  Skin: No evidence of breakdown, no evidence of rash Neurologic: Cranial nerves II through XII intact, motor strength is 5/5 in right and 0/5deltoid, bicep, tricep, grip, hip flexor, knee extensors, ankle dorsiflexor and plantar flexor Sensory exam reduced sensation to LT in LUE and LLE  Musculoskeletal: Full range of motion in all 4 extremities. No joint swelling   Assessment/Plan: 1. Functional deficits which require 3+ hours per day of interdisciplinary therapy in a comprehensive inpatient rehab setting. Physiatrist is providing close team supervision and 24 hour management of active medical problems listed below. Physiatrist and rehab team continue to assess barriers to discharge/monitor patient progress toward functional and medical goals  Care Tool:  Bathing              Bathing assist        Upper Body Dressing/Undressing Upper body dressing        Upper body assist      Lower Body Dressing/Undressing Lower body dressing            Lower body assist       Toileting Toileting    Toileting assist Assist for toileting: Moderate Assistance - Patient 50 - 74%     Transfers Chair/bed transfer  Transfers assist           Locomotion Ambulation   Ambulation assist              Walk 10 feet activity   Assist           Walk 50 feet activity   Assist           Walk 150 feet activity   Assist           Walk 10 feet on uneven surface  activity   Assist           Wheelchair     Assist               Wheelchair 50 feet with 2 turns activity  Assist            Wheelchair 150 feet activity     Assist          Blood pressure (!) 158/80, pulse 86, temperature 98.1 F (36.7 C), resp. rate 16, weight 56.7 kg, SpO2 92 %.  Medical Problem List and Plan: 1. Functional deficits secondary to right MCA infarction, A2 and P2 occlusion status post TNK/status post thrombectomy             -patient may  shower             -ELOS/Goals: 14-16d Min/Mod A goals for ADLs and Mobility  Team conf in am  2.  Antithrombotics: -DVT/anticoagulation:  SCD             -antiplatelet therapy: Eliquis 2.5 mg BID 3. Pain Management: Lidoderm patch.  Tramadol as needed 4. Mood/Behavior/Sleep: Provide emotional support             -antipsychotic agents: N/A 5. Neuropsych/cognition: This patient IS capable of making decisions on her own behalf. 6. Skin/Wound Care: Routine skin checks 7. Fluids/Electrolytes/Nutrition: Routine in and outs with follow-up chemistries 8.  Atrial fibrillation/pacemaker.  Lopressor 12.5 mg every 12 hours.  Cardiac rate controlled  Eliquis 2.'5mg'$  BID 9.  Diabetes mellitus.  Hemoglobin A1c 6.9.  SSI. CBG (last 3)  Recent Labs    05/05/22 1643 05/05/22 2043 05/06/22 0601  GLUCAP 124*  149* 137*  Controlled , only received 3 U SSI   10.  Dysphagia.  Dysphagia #3 thin liquids.  Follow-up speech therapy 11.  GERD.  Protonix  12.  Hypo K+ improved after po supplementation cont to monitor     Latest Ref Rng & Units 05/06/2022    5:41 AM 05/05/2022    8:16 AM 05/04/2022    3:52 AM  BMP  Glucose 70 - 99 mg/dL 140  131  135   BUN 8 - 23 mg/dL '14  13  17   '$ Creatinine 0.44 - 1.00 mg/dL 0.88  0.84  0.84   Sodium 135 - 145 mmol/L 137  137  140   Potassium 3.5 - 5.1 mmol/L 3.5  2.9  3.5   Chloride 98 - 111 mmol/L 96  97  101   CO2 22 - 32 mmol/L '30  29  27   '$ Calcium 8.9 - 10.3 mg/dL 8.7  8.3  8.4     LOS: 1 days A FACE TO FACE EVALUATION WAS PERFORMED  Luanna Salk Danyelle Brookover 05/06/2022, 8:41 AM

## 2022-05-06 NOTE — Plan of Care (Signed)
Problem: RH Balance Goal: LTG: Patient will maintain dynamic sitting balance (OT) Description: LTG:  Patient will maintain dynamic sitting balance with assistance during activities of daily living (OT) Flowsheets (Taken 05/06/2022 1608) LTG: Pt will maintain dynamic sitting balance during ADLs with: Minimal Assistance - Patient > 75% Goal: LTG Patient will maintain dynamic standing with ADLs (OT) Description: LTG:  Patient will maintain dynamic standing balance with assist during activities of daily living (OT)  Flowsheets (Taken 05/06/2022 1608) LTG: Pt will maintain dynamic standing balance during ADLs with: Moderate Assistance - Patient 50 - 74%   Problem: Sit to Stand Goal: LTG:  Patient will perform sit to stand in prep for activites of daily living with assistance level (OT) Description: LTG:  Patient will perform sit to stand in prep for activites of daily living with assistance level (OT) Flowsheets (Taken 05/06/2022 1608) LTG: PT will perform sit to stand in prep for activites of daily living with assistance level: Minimal Assistance - Patient > 75%   Problem: RH Eating Goal: LTG Patient will perform eating w/assist, cues/equip (OT) Description: LTG: Patient will perform eating with assist, with/without cues using equipment (OT) Flowsheets (Taken 05/06/2022 1608) LTG: Pt will perform eating with assistance level of: Supervision/Verbal cueing   Problem: RH Grooming Goal: LTG Patient will perform grooming w/assist,cues/equip (OT) Description: LTG: Patient will perform grooming with assist, with/without cues using equipment (OT) Flowsheets (Taken 05/06/2022 1608) LTG: Pt will perform grooming with assistance level of: Minimal Assistance - Patient > 75%   Problem: RH Bathing Goal: LTG Patient will bathe all body parts with assist levels (OT) Description: LTG: Patient will bathe all body parts with assist levels (OT) Flowsheets (Taken 05/06/2022 1608) LTG: Pt will perform  bathing with assistance level/cueing: Minimal Assistance - Patient > 75% LTG: Position pt will perform bathing:  At sink  Shower   Problem: RH Dressing Goal: LTG Patient will perform upper body dressing (OT) Description: LTG Patient will perform upper body dressing with assist, with/without cues (OT). Flowsheets (Taken 05/06/2022 1608) LTG: Pt will perform upper body dressing with assistance level of: Minimal Assistance - Patient > 75% Goal: LTG Patient will perform lower body dressing w/assist (OT) Description: LTG: Patient will perform lower body dressing with assist, with/without cues in positioning using equipment (OT) Flowsheets (Taken 05/06/2022 1608) LTG: Pt will perform lower body dressing with assistance level of: Moderate Assistance - Patient 50 - 74%   Problem: RH Toileting Goal: LTG Patient will perform toileting task (3/3 steps) with assistance level (OT) Description: LTG: Patient will perform toileting task (3/3 steps) with assistance level (OT)  Flowsheets (Taken 05/06/2022 1608) LTG: Pt will perform toileting task (3/3 steps) with assistance level: Moderate Assistance - Patient 50 - 74%   Problem: RH Vision Goal: RH LTG Vision Theme park manager) Flowsheets (Taken 05/06/2022 1608) LTG: Vision Goals: Patient will visually locate familiar ADL items 15* left of midline with min cueing   Problem: RH Functional Use of Upper Extremity Goal: LTG Patient will use RT/LT upper extremity as a (OT) Description: LTG: Patient will use right/left upper extremity as a stabilizer/gross assist/diminished/nondominant/dominant level with assist, with/without cues during functional activity (OT) Flowsheets (Taken 05/06/2022 1608) LTG: Use of upper extremity in functional activities: LUE as gross assist level LTG: Pt will use upper extremity in functional activity with assistance level of: Minimal Assistance - Patient > 75%   Problem: RH Toilet Transfers Goal: LTG Patient will perform toilet  transfers w/assist (OT) Description: LTG: Patient will perform toilet transfers with  assist, with/without cues using equipment (OT) Flowsheets (Taken 05/06/2022 1608) LTG: Pt will perform toilet transfers with assistance level of: Moderate Assistance - Patient 50 - 74%   Problem: RH Tub/Shower Transfers Goal: LTG Patient will perform tub/shower transfers w/assist (OT) Description: LTG: Patient will perform tub/shower transfers with assist, with/without cues using equipment (OT) Flowsheets (Taken 05/06/2022 1608) LTG: Pt will perform tub/shower stall transfers with assistance level of: Moderate Assistance - Patient 50 - 74% LTG: Pt will perform tub/shower transfers from: Walk in shower   Problem: RH Attention Goal: LTG Patient will demonstrate this level of attention during functional activites (OT) Description: LTG:  Patient will demonstrate this level of attention during functional activites  (OT) Flowsheets (Taken 05/06/2022 1608) Patient will demonstrate this level of attention during functional activites: Selective Patient will demonstrate above attention level in the following environment: Home LTG: Patient will demonstrate this level of attention during functional activites (OT): Minimal Assistance - Patient > 75%

## 2022-05-06 NOTE — Progress Notes (Signed)
Inpatient Rehabilitation Care Coordinator Assessment and Plan Patient Details  Name: Kara [name] MRN: [id number] Date of Birth: Nov 10, 1922  Today's Date: 05/06/2022  Hospital Problems: Principal Problem:   Right middle cerebral artery stroke [facility])  Past Medical History:  Past Medical History:  Diagnosis Date   Allergy    Atrial fibrillation ([place])    CAD (coronary artery disease)    Cancer (HCC)    UTERINE   Chronic anticoagulation    Chronic diarrhea    Closed compression fracture of L2 lumbar vertebra, initial encounter ([place]) 11/08/2020   By CT scan done to evaluate severe persistent low back pain:  Impression below also notes spinal compression from bulging disk.     Superior endplate compression fracture of L2 with approximately 25% height loss and minimal, 2-3 mm retropulsion of the superior endplate.   Left-sided, nondisplaced zone 1 sacral insufficiency fracture.   Multilevel degenerative changes of the lumbar spine resulting    Closed intertrochanteric fracture of left femur ([place]) 08/22/2020   Decubitus ulcer    sacral region   Diabetes ([place])    diet controlled   Dysuria    E. coli UTI 11/21/2014   Edema of lower extremity    mainly right foot, slightly in left foot.   Facial fracture due to fall ([place]) 07/13/2020   Femur fracture, left ([place]) 06/09/2019   Fibrocystic breast disease    GERD (gastroesophageal reflux disease)    Glaucoma    Glaucoma    Hematuria    Hemorrhoids    History of colon polyps    History of pancreatitis    Hospital discharge follow-up 07/22/2020   Hyperlipidemia    Hypertension    Hypokalemia    IBS (irritable bowel syndrome)    Microscopic hematuria    Mitral valve regurgitation    Orbital fracture ([place]) 07/13/2020   Osteoarthritis    fingers   Pernicious anemia    Plantar fasciitis    Recurrent UTI    Skin cancer    Sleep apnea, obstructive    Umbilical hernia without obstruction and without gangrene 06/26/2015   Vaginal atrophy     Vitamin D deficiency    Yeast vaginitis    Past Surgical History:  Past Surgical History:  Procedure Laterality Date   ABDOMINAL HYSTERECTOMY  1980's   ABDOMINAL SURGERY     for villous polyp,,,many years ago   APPENDECTOMY  1940's   ASCAD, s/p PTCA  11/28/2005   MID LESION    BREAST BIOPSY Left 1970's   CARPAL TUNNEL RELEASE Right 12/13/2015   Procedure: CARPAL TUNNEL RELEASE;  Surgeon: Thornton Park, MD;  Location: [facility] ORS;  Service: Orthopedics;  Laterality: Right;   COLECTOMY  2015   INTRAMEDULLARY (IM) NAIL INTERTROCHANTERIC Left 06/11/2019   Procedure: INTRAMEDULLARY (IM) NAIL INTERTROCHANTRIC;  Surgeon: Earnestine Leys, MD;  Location: [facility] OR[facility];  Service: Orthopedics;  Laterality: Left;   IR CT HEAD LTD  05/01/2022   IR PERCUTANEOUS ART THROMBECTOMY/INFUSION INTRACRANIAL INC DIAG ANGIO  05/01/2022   IR US GUIDE VASC ACCESS RIGHT  05/01/2022   KYPHOPLASTY N/A 11/15/2020   Procedure: Hewitt Shorts;  Surgeon: Hessie Knows, MD;  Location: [facility] ORS;  Service: Orthopedics;  Laterality: N/A;   PACEMAKER PLACEMENT     radation     for uterine cance   RADIOLOGY WITH ANESTHESIA N/A 05/01/2022   Procedure: IR WITH ANESTHESIA;  Surgeon: Radiologist, Medication, MD;  Location: [place];  Service: Radiology;  Laterality: N/A;   REFRACTIVE SURGERY  for bilateral glaumoma   SACROPLASTY N/A 11/15/2020   Procedure: SACROPLASTY;  Surgeon: Hessie Knows, MD;  Location: [facility] OR[facility];  Service: Orthopedics;  Laterality: N/A;   TONSILLECTOMY  1936   Social History:  reports that she has quit smoking. She has never used smokeless tobacco. She reports current alcohol use. She reports that she does not use drugs.  Family / Support Systems Children: daughter, Kara [name] Other Supports: Alden Benjamin, friend Anticipated Caregiver: Daughter, Hospital doctor and PCA, Facility ([employer]) Ability/Limitations of Caregiver: none Caregiver Availability: 24/7 Family Dynamics: daughter and Publishing copy  Social  History Preferred language: English Religion: [place] - How often do you need to have someone help you when you read instructions, pamphlets, or other written material from your doctor or pharmacy?: Never Writes: Yes   Abuse/Neglect Abuse/Neglect Assessment Can Be Completed: Yes  Patient response to: Social Isolation - How often do you feel lonely or isolated from those around you?: Rarely  Emotional Status    Patient / Family Perceptions, Expectations & Goals Premorbid pt/family roles/activities: Independent living, with PCA. Patient limitied only going out for appointments and grocery shopping Pt/family expectations/goals: [employer]: Other (Comment) (PCA and [employer]) Premorbid Home Care/DME Agencies: Other (Comment) ([facility], [facility], and [employer]) Is the patient able to respond to transportation needs?: Yes In the past 12 months, has lack of transportation kept you from medical appointments or from getting medications?: No In the past 12 months, has lack of transportation kept you from meetings, work, or from getting things needed for daily living?: No Resource referrals recommended: Neuropsychology  Discharge Planning Living Arrangements: [place]: Children, Home care staff, Other (Comment) Type of Residence: Independent Living Management consultant entrance; [employer]) Insurance Resources: Chartered certified accountant Resources: Family Support Financial Screen Referred: No Living Expenses: Other (Comment) ([place]) Money Management: Family, Patient Does the patient have any problems obtaining your medications?: No Home Management: Independent Patient/Family Preliminary Plans: some assistance from PCA and daughter Care Coordinator Barriers to Discharge: Decreased caregiver support, Lack of/limited family support Care Coordinator Anticipated Follow Up Needs: HH/OP Expected length  of stay: 14-16 Days  Clinical Impression SW went to met patient, patient asleep. Spoke with daughter, Kara [name] via telephone, introduced self and explained role. Daughter expressed she anticipates patient will be able to return to ILF. If unable the facility has ALF and SNF options available. Sw will provide daughter and facility with updates tomorrow after conference. No additional questions or concerns.   Dyanne Iha 05/06/2022, 11:56 AM

## 2022-05-06 NOTE — Evaluation (Signed)
Physical Therapy Assessment and Plan  Patient Details  Name: Kara [name] MRN: [id number] Date of Birth: 1923-05-15  PT Diagnosis: {diagnoses:3041673} Rehab Potential: Fair ELOS: 16-20 days   {CHL IP REHAB PT TIME CALCULATION:304800500}   Hospital Problem: Principal Problem:   Right middle cerebral artery stroke [facility])   Past Medical History:  Past Medical History:  Diagnosis Date   Allergy    Atrial fibrillation ([place])    CAD (coronary artery disease)    Cancer (HCC)    UTERINE   Chronic anticoagulation    Chronic diarrhea    Closed compression fracture of L2 lumbar vertebra, initial encounter ([place]) 11/08/2020   By CT scan done to evaluate severe persistent low back pain:  Impression below also notes spinal compression from bulging disk.     Superior endplate compression fracture of L2 with approximately 25% height loss and minimal, 2-3 mm retropulsion of the superior endplate.   Left-sided, nondisplaced zone 1 sacral insufficiency fracture.   Multilevel degenerative changes of the lumbar spine resulting    Closed intertrochanteric fracture of left femur ([place]) 08/22/2020   Decubitus ulcer    sacral region   Diabetes ([place])    diet controlled   Dysuria    E. coli UTI 11/21/2014   Edema of lower extremity    mainly right foot, slightly in left foot.   Facial fracture due to fall ([place]) 07/13/2020   Femur fracture, left ([place]) 06/09/2019   Fibrocystic breast disease    GERD (gastroesophageal reflux disease)    Glaucoma    Glaucoma    Hematuria    Hemorrhoids    History of colon polyps    History of pancreatitis    Hospital discharge follow-up 07/22/2020   Hyperlipidemia    Hypertension    Hypokalemia    IBS (irritable bowel syndrome)    Microscopic hematuria    Mitral valve regurgitation    Orbital fracture ([place]) 07/13/2020   Osteoarthritis    fingers   Pernicious anemia    Plantar fasciitis    Recurrent UTI    Skin cancer    Sleep apnea, obstructive    Umbilical hernia  without obstruction and without gangrene 06/26/2015   Vaginal atrophy    Vitamin D deficiency    Yeast vaginitis    Past Surgical History:  Past Surgical History:  Procedure Laterality Date   ABDOMINAL HYSTERECTOMY  1980's   ABDOMINAL SURGERY     for villous polyp,,,many years ago   APPENDECTOMY  1940's   ASCAD, s/p PTCA  11/28/2005   MID LESION    BREAST BIOPSY Left 1970's   CARPAL TUNNEL RELEASE Right 12/13/2015   Procedure: CARPAL TUNNEL RELEASE;  Surgeon: Thornton Park, MD;  Location: [facility] ORS;  Service: Orthopedics;  Laterality: Right;   COLECTOMY  2015   INTRAMEDULLARY (IM) NAIL INTERTROCHANTERIC Left 06/11/2019   Procedure: INTRAMEDULLARY (IM) NAIL INTERTROCHANTRIC;  Surgeon: Earnestine Leys, MD;  Location: [facility] ORS;  Service: Orthopedics;  Laterality: Left;   IR CT HEAD LTD  05/01/2022   IR PERCUTANEOUS ART THROMBECTOMY/INFUSION INTRACRANIAL INC DIAG ANGIO  05/01/2022   IR US GUIDE VASC ACCESS RIGHT  05/01/2022   KYPHOPLASTY N/A 11/15/2020   Procedure: Hewitt Shorts;  Surgeon: Hessie Knows, MD;  Location: [facility] ORS;  Service: Orthopedics;  Laterality: N/A;   PACEMAKER PLACEMENT     radation     for uterine cance   RADIOLOGY WITH ANESTHESIA N/A 05/01/2022   Procedure: IR WITH ANESTHESIA;  Surgeon: Radiologist, Medication, MD;  Location: [place];  Service: Radiology;  Laterality: N/A;   REFRACTIVE SURGERY     for bilateral glaumoma   SACROPLASTY N/A 11/15/2020   Procedure: SACROPLASTY;  Surgeon: Hessie Knows, MD;  Location: [facility] ORS;  Service: Orthopedics;  Laterality: N/A;   TONSILLECTOMY  1936    Assessment & Plan Clinical Impression: Patient is a 88 y.o. year old female with recent admission to the hospital on *** with ***.  Patient transferred to CIR on 05/05/2022 .   Patient currently requires {KDT:2671245} with mobility secondary to {impairments:3041632}.  Prior to hospitalization, patient was {YKD:9833825} with mobility and lived with Alone in a Independent living  facility home.  Home access is  Level entry.  Patient will benefit from skilled PT intervention to {benefits:22816} for planned discharge {planned discharge:3041670}.  Anticipate patient will {follow KN:3976734} at discharge.  PT - End of Session Activity Tolerance: Tolerates 10 - 20 min activity with multiple rests Endurance Deficit: Yes Endurance Deficit Description: Difficulty maintaining eyes open PT Assessment Rehab Potential (ACUTE/IP ONLY): Fair PT Barriers to Discharge: Decreased caregiver support;Neurogenic Bowel & Bladder;Lack of/limited family support;Insurance for SNF coverage;Weight;Nutrition means PT Patient demonstrates impairments in the following area(s): Balance;Edema;Endurance;Motor;Nutrition;Pain;Perception;Safety;Sensory;Skin Integrity;Other (comment) PT Transfers Functional Problem(s): Bed Mobility;Bed to Chair;Car;Furniture PT Locomotion Functional Problem(s): Ambulation;Wheelchair Mobility;Stairs PT Plan PT Intensity: Minimum of 1-2 x/day ,45 to 90 minutes PT Frequency: 5 out of 7 days PT Duration Estimated Length of Stay: 16-20 days PT Treatment/Interventions: Ambulation/gait training;Community reintegration;DME/adaptive equipment instruction;Neuromuscular re-education;Psychosocial support;Stair training;UE/LE Strength taining/ROM;Wheelchair propulsion/positioning;UE/LE Coordination activities;Therapeutic Activities;Skin care/wound management;Pain management;Functional electrical stimulation;Discharge planning;Balance/vestibular training;Cognitive remediation/compensation;Disease management/prevention;Functional mobility training;Patient/family education;Splinting/orthotics;Therapeutic Exercise;Visual/perceptual remediation/compensation PT Transfers Anticipated Outcome(s): CGA PT Locomotion Anticipated Outcome(s): MinA PT Recommendation Recommendations for Other Services: Therapeutic Recreation consult Therapeutic Recreation Interventions: Pet therapy;Stress  management Follow Up Recommendations: Home health PT;24 hour supervision/assistance Patient destination: Home Equipment Recommended: To be determined   PT Evaluation Precautions/Restrictions Precautions Precautions: Fall Precaution Comments: L hemipareisis with L neglect, intermittent pusher Restrictions Weight Bearing Restrictions: No General   Vital Signs Pain Pain Assessment Pain Scale: Faces Pain Score: 6  Faces Pain Scale: Hurts whole lot Pain Type: Neuropathic pain Pain Location: Leg Pain Orientation: Right;Left;Lower Pain Descriptors / Indicators: Aching;Discomfort Pain Onset: With Activity Patients Stated Pain Goal: 0 Pain Intervention(s): Repositioned Multiple Pain Sites: No Pain Interference Pain Interference Pain Effect on Sleep: 0. Does not apply - I have not had any pain or hurting in the past 5 days Pain Interference with Therapy Activities: 3. Frequently Pain Interference with Day-to-Day Activities: 3. Frequently Home Living/Prior Functioning Home Living Available Help at Discharge: Personal care attendant;Available PRN/intermittently Type of Home: Independent living facility Home Access: Level entry Home Layout: One level Bathroom Shower/Tub: Multimedia programmer: Handicapped height Bathroom Accessibility: Yes Additional Comments: likes to play Bridge  Lives With: Alone Prior Function Level of Independence: Needs assistance with homemaking;Needs assistance with tranfers;Needs assistance with gait;Needs assistance with ADLs Driving: No Vocation: Retired Radiographer, therapeutic - History Ability to See in Adequate Light: 1 Impaired Vision - Assessment Eye Alignment: Impaired (comment) Ocular Range of Motion: Restricted on the left Alignment/Gaze Preference: Gaze right Tracking/Visual Pursuits: Impaired - to be further tested in functional context Saccades: Impaired - to be further tested in functional context Convergence: Impaired  (comment) Perception Perception: Impaired Inattention/Neglect: Does not attend to left side of body;Does not attend to left visual field Praxis Praxis: Impaired Praxis Impairment Details: Initiation;Ideomotor;Motor planning  Cognition Overall Cognitive Status: Impaired/Different from baseline Arousal/Alertness: Awake/alert Orientation Level: Oriented X4  Attention: Focused Focused Attention: Impaired Focused Attention Impairment: Functional basic Sustained Attention: Impaired Sustained Attention Impairment: Functional basic Memory: Impaired Memory Impairment: Decreased recall of new information;Decreased short term memory;Retrieval deficit Decreased Short Term Memory: Functional basic;Verbal basic Awareness: Impaired Awareness Impairment: Intellectual impairment Problem Solving: Impaired Problem Solving Impairment: Functional basic Executive Function: Initiating;Self Monitoring Sequencing: Impaired Sequencing Impairment: Functional basic Organizing: Impaired Organizing Impairment: Functional basic Initiating: Impaired Initiating Impairment: Functional basic Self Monitoring: Impaired Self Monitoring Impairment: Functional basic Behaviors: Poor frustration tolerance Safety/Judgment: Impaired Comments: left neglect - intermittently pushes toward left Sensation Sensation Light Touch: Impaired by gross assessment (diabetic neuropathy at feet; relates light lins/ needles at distal RIGHT LE) Hot/Cold: Impaired by gross assessment Proprioception: Impaired by gross assessment Stereognosis: Not tested Additional Comments: Difficulty locating arm on body; locates LLE Coordination Gross Motor Movements are Fluid and Coordinated: No Fine Motor Movements are Fluid and Coordinated: No Heel Shin Test: unable to follow instructions to perform Motor  Motor Motor: Abnormal tone;Abnormal postural alignment and control;Hemiplegia;Motor impersistence Motor - Skilled Clinical Observations:  LLE hemipareisis, hemiplegia/ hypotonic LUE   Trunk/Postural Assessment  Cervical Assessment Cervical Assessment: Exceptions to [facility] (forward head with reduced ability to correct downward head tilt in stance) Thoracic Assessment Thoracic Assessment: Exceptions to [facility] (kyphotic posture) Lumbar Assessment Lumbar Assessment: Exceptions to [facility] (sacral sitting with posterior pelvic tilt in stance) Postural Control Postural Control: Deficits on evaluation Head Control: Forward head rotated right Trunk Control: Posterior left bias, left lateral flexion  Balance Balance Balance Assessed: Yes Static Sitting Balance Static Sitting - Balance Support: Right upper extremity supported;Feet supported Static Sitting - Level of Assistance: 3: Mod assist Dynamic Sitting Balance Dynamic Sitting - Balance Support: Right upper extremity supported;Feet supported;During functional activity Dynamic Sitting - Level of Assistance: 2: Max assist Dynamic Sitting - Balance Activities: Forward lean/weight shifting;Lateral lean/weight shifting;Head control activities;Trunk control activities Sitting balance - Comments: intermittently pushes to weaker left side Static Standing Balance Static Standing - Balance Support: Right upper extremity supported;During functional activity Static Standing - Level of Assistance: 2: Max assist Extremity Assessment  RUE Assessment RUE Assessment: Exceptions to [facility] Active Range of Motion (AROM) Comments: Observed 80* of flexion - needs further assessment LUE Assessment LUE Assessment: Exceptions to [facility] Active Range of Motion (AROM) Comments: Edematous and discolored LUE.  Has delayed elbow and digit flexion through 20% available range.  Need further assessment General Strength Comments: NT LUE Body System: Neuro Brunstrum levels for arm and hand: Arm;Hand Brunstrum level for arm: Stage I Presynergy Brunstrum level for hand: Stage II Synergy is developing RLE Assessment RLE  Assessment: Exceptions to [facility] General Strength Comments: grossly 3+ to 4-/ 5 LLE Assessment LLE Assessment: Exceptions to [facility] LLE Strength LLE Overall Strength: Deficits Left Hip Flexion: 3-/5 Left Hip Extension: 3-/5 Left Hip ABduction: 2-/5 Left Hip ADduction: 3+/5 Left Knee Flexion: 3+/5 Left Knee Extension: 4-/5  Care Tool Care Tool Bed Mobility Roll left and right activity   Roll left and right assist level: Maximal Assistance - Patient 25 - 49%    Sit to lying activity   Sit to lying assist level: Maximal Assistance - Patient 25 - 49%    Lying to sitting on side of bed activity   Lying to sitting on side of bed assist level: the ability to move from lying on the back to sitting on the side of the bed with no back support.: Maximal Assistance - Patient 25 - 49%     Care Tool Transfers Sit  to stand transfer   Sit to stand assist level: Maximal Assistance - Patient 25 - 49%    Chair/bed transfer   Chair/bed transfer assist level: Total Assistance - Patient < 25%     Toilet transfer   Assist Level: Maximal Assistance - Patient 24 - 49%    Car transfer   Car transfer assist level: Maximal Assistance - Patient 25 - 49%      Care Tool Locomotion Ambulation Ambulation activity did not occur: Safety/medical concerns        Walk 10 feet activity Walk 10 feet activity did not occur: Safety/medical concerns       Walk 50 feet with 2 turns activity Walk 50 feet with 2 turns activity did not occur: Safety/medical concerns      Walk 150 feet activity Walk 150 feet activity did not occur: Safety/medical concerns      Walk 10 feet on uneven surfaces activity Walk 10 feet on uneven surfaces activity did not occur: Safety/medical concerns      Stairs Stair activity did not occur: Safety/medical concerns        Walk up/down 1 step activity Walk up/down 1 step or curb (drop down) activity did not occur: Safety/medical concerns      Walk up/down 4 steps activity Walk  up/down 4 steps activity did not occur: Safety/medical concerns      Walk up/down 12 steps activity Walk up/down 12 steps activity did not occur: Safety/medical concerns      Pick up small objects from floor Pick up small object from the floor (from standing position) activity did not occur: Safety/medical concerns      Wheelchair Is the patient using a wheelchair?: Yes Type of Wheelchair: Manual Wheelchair activity did not occur: Safety/medical concerns Wheelchair assist level: Total Assistance - Patient < 25% Max wheelchair distance: 20 ft  Wheel 50 feet with 2 turns activity Wheelchair 50 feet with 2 turns activity did not occur: Safety/medical concerns    Wheel 150 feet activity Wheelchair 150 feet activity did not occur: Safety/medical concerns      Refer to Care Plan for [place] [facility]    Recommendations for other services: {RECOMMENDATIONS FOR OTHER SERVICES:3049016}  Skilled Therapeutic Intervention Mobility Bed Mobility Bed Mobility: Rolling Right;Rolling Left;Supine to Sit;Sit to Supine;Right Sidelying to Sit Rolling Right: Maximal Assistance - Patient 25-49% Rolling Left: Maximal Assistance - Patient 25-49% Right Sidelying to Sit: Moderate Assistance - Patient 50-74% Supine to Sit: Maximal Assistance - Patient - Patient 25-49% Sit to Supine: Maximal Assistance - Patient 25-49% Transfers Transfers: Sit to Stand;Stand to Sit Sit to Stand: Maximal Assistance - Patient 25-49% Stand to Sit: Moderate Assistance - Patient 50-74% Transfer (Assistive device): 1 person hand held assist (RUE on bedrail) Locomotion  Gait Ambulation: No Stairs / Additional Locomotion Stairs: No Wheelchair Mobility Wheelchair Mobility: No   Discharge Criteria: Patient will be discharged from PT if patient refuses treatment 3 consecutive times without medical reason, if treatment goals not met, if there is a change in medical status, if patient makes no progress  towards goals or if patient is discharged from hospital.  The above assessment, treatment plan, treatment alternatives and goals were discussed and mutually agreed upon: {Assessment/Treatment Plan Discussed/Agreed:3049017}  Alger Simons PT, DPT, CSRS  05/06/2022, 7:02 PM

## 2022-05-06 NOTE — Evaluation (Signed)
Occupational Therapy Assessment and Plan  Patient Details  Name: Kara [name] MRN: [id number] Date of Birth: 1922/10/22  OT Diagnosis: abnormal posture, acute pain, altered mental status, cognitive deficits, disturbance of vision, flaccid hemiplegia and hemiparesis, hemiplegia affecting non-dominant side, muscle weakness (generalized), pain in joint, and swelling of limb Rehab Potential: Rehab Potential (ACUTE ONLY): Fair ELOS: 21-24 days   Today's Date: 05/06/2022 OT Individual Time: 9480-1655 OT Individual Time Calculation (min): 80 min     Hospital Problem: Principal Problem:   Right middle cerebral artery stroke [facility]/[place])   Past Medical History:  Past Medical History:  Diagnosis Date   Allergy    Atrial fibrillation ([place])    CAD (coronary artery disease)    Cancer (HCC)    UTERINE   Chronic anticoagulation    Chronic diarrhea    Closed compression fracture of L2 lumbar vertebra, initial encounter ([place]) 11/08/2020   By CT scan done to evaluate severe persistent low back pain:  Impression below also notes spinal compression from bulging disk.     Superior endplate compression fracture of L2 with approximately 25% height loss and minimal, 2-3 mm retropulsion of the superior endplate.   Left-sided, nondisplaced zone 1 sacral insufficiency fracture.   Multilevel degenerative changes of the lumbar spine resulting    Closed intertrochanteric fracture of left femur ([place]) 08/22/2020   Decubitus ulcer    sacral region   Diabetes ([place])    diet controlled   Dysuria    E. coli UTI 11/21/2014   Edema of lower extremity    mainly right foot, slightly in left foot.   Facial fracture due to fall ([place]) 07/13/2020   Femur fracture, left ([place]) 06/09/2019   Fibrocystic breast disease    GERD (gastroesophageal reflux disease)    Glaucoma    Glaucoma    Hematuria    Hemorrhoids    History of colon polyps    History of pancreatitis    Hospital discharge follow-up 07/22/2020   Hyperlipidemia     Hypertension    Hypokalemia    IBS (irritable bowel syndrome)    Microscopic hematuria    Mitral valve regurgitation    Orbital fracture ([place]) 07/13/2020   Osteoarthritis    fingers   Pernicious anemia    Plantar fasciitis    Recurrent UTI    Skin cancer    Sleep apnea, obstructive    Umbilical hernia without obstruction and without gangrene 06/26/2015   Vaginal atrophy    Vitamin D deficiency    Yeast vaginitis    Past Surgical History:  Past Surgical History:  Procedure Laterality Date   ABDOMINAL HYSTERECTOMY  1980's   ABDOMINAL SURGERY     for villous polyp,,,many years ago   APPENDECTOMY  1940's   ASCAD, s/p PTCA  11/28/2005   MID LESION    BREAST BIOPSY Left 1970's   CARPAL TUNNEL RELEASE Right 12/13/2015   Procedure: CARPAL TUNNEL RELEASE;  Surgeon: Thornton Park, MD;  Location: [facility] ORS;  Service: Orthopedics;  Laterality: Right;   COLECTOMY  2015   INTRAMEDULLARY (IM) NAIL INTERTROCHANTERIC Left 06/11/2019   Procedure: INTRAMEDULLARY (IM) NAIL INTERTROCHANTRIC;  Surgeon: Earnestine Leys, MD;  Location: [facility] ORS;  Service: Orthopedics;  Laterality: Left;   IR CT HEAD LTD  05/01/2022   IR PERCUTANEOUS ART THROMBECTOMY/INFUSION INTRACRANIAL INC DIAG ANGIO  05/01/2022   IR US GUIDE VASC ACCESS RIGHT  05/01/2022   KYPHOPLASTY N/A 11/15/2020   Procedure: Hewitt Shorts;  Surgeon: Hessie Knows, MD;  Location:  [facility] ORS;  Service: Orthopedics;  Laterality: N/A;   PACEMAKER PLACEMENT     radation     for uterine cance   RADIOLOGY WITH ANESTHESIA N/A 05/01/2022   Procedure: IR WITH ANESTHESIA;  Surgeon: Radiologist, Medication, MD;  Location: [place];  Service: Radiology;  Laterality: N/A;   REFRACTIVE SURGERY     for bilateral glaumoma   SACROPLASTY N/A 11/15/2020   Procedure: SACROPLASTY;  Surgeon: Hessie Knows, MD;  Location: [facility] ORS;  Service: Orthopedics;  Laterality: N/A;   TONSILLECTOMY  1936    Assessment & Plan Clinical Impression: Patient is an 88 year old  right-handed female history of atrial fibrillation/pacemaker not on anticoagulation, CAD, diet controlled diabetes mellitus, hypertension, hyperlipidemia, uterine cancer, history of L2 compression fracture using tramadol as needed.  Per chart review lives alone.  Patient resides in an independent living facility/[facility].  She has a PCA who assists with groceries and cleaning.  Patient ambulates with a rollator.  Presented 05/01/2022 with acute onset of left-sided weakness, right gaze deviation and dysarthria.  Blood pressure elevated 200s/100s.  Cranial CT scan showed age-indeterminate possibly acute infarct right cerebellum.  No hemorrhage.  CT angiogram head and neck acute occlusion of the right M1/M2 junction with nearly absent arborization in the right MCA territory.  Thrombus and acute occlusion of the distal A2 segment of the right ACA.  Patient received TNK and revascularization/thrombectomy per interventional radiology.  Follow-up CT of the head 05/02/2022 showed some early cytotoxic edema throughout most of the right MCA territory consistent with early subacute infarction.  No acute hemorrhage or midline shift.  Echocardiogram ejection fraction of 60 to 65% no wall motion abnormalities.  Admission chemistries unremarkable except potassium 2.7, glucose 198, total bilirubin 1.3, hemoglobin A1c 6.9.  Currently maintained on full-strength aspirin 325 mg daily changed to Eliquis 2.5 mg BID 12/18.  .    Currently on dysphagia #3 thin liquid diet.Patient transferred to CIR on 05/05/2022 .    Patient currently requires total with basic self-care skills secondary to muscle weakness, impaired timing and sequencing, abnormal tone, decreased coordination, and decreased motor planning, decreased visual perceptual skills, decreased visual motor skills, and field cut, decreased midline orientation, decreased attention to left, decreased attention to right, left side neglect, and decreased motor planning, decreased  initiation, decreased attention, decreased awareness, decreased problem solving, decreased safety awareness, decreased memory, and delayed processing, and decreased sitting balance, decreased standing balance, decreased postural control, hemiplegia, and decreased balance strategies.  Prior to hospitalization, patient could complete BADL with min.  Patient will benefit from skilled intervention to decrease level of assist with basic self-care skills and increase independence with basic self-care skills prior to discharge home with care partner.  Anticipate patient will require moderate physical assestance and follow up home health and follow up outpatient.  OT - End of Session Activity Tolerance: Decreased this session Endurance Deficit: Yes Endurance Deficit Description: Difficulty maintaining eyes open OT Assessment Rehab Potential (ACUTE ONLY): Fair OT Patient demonstrates impairments in the following area(s): Balance;Motor;Sensory;Behavior;Skin Integrity;Cognition;Pain;Vision;Edema;Perception;Endurance;Safety OT Basic ADL's Functional Problem(s): Eating;Grooming;Bathing;Dressing;Toileting OT Transfers Functional Problem(s): Toilet;Tub/Shower OT Additional Impairment(s): Fuctional Use of Upper Extremity OT Plan OT Intensity: Minimum of 1-2 x/day, 45 to 90 minutes OT Frequency: 5 out of 7 days OT Duration/Estimated Length of Stay: 21-24 days OT Treatment/Interventions: Balance/vestibular training;Disease mangement/prevention;Neuromuscular re-education;Patient/family education;Self Care/advanced ADL retraining;Splinting/orthotics;Therapeutic Exercise;UE/LE Coordination activities;Cognitive remediation/compensation;Discharge planning;DME/adaptive equipment instruction;Functional mobility training;Pain management;Skin care/wound managment;Therapeutic Activities;UE/LE Strength taining/ROM;Visual/perceptual remediation/compensation OT Self Feeding Anticipated Outcome(s): Supervision OT Basic  Self-Care Anticipated Outcome(s):  Min assist OT Toileting Anticipated Outcome(s): mod assist OT Bathroom Transfers Anticipated Outcome(s): mod assist OT Recommendation Patient destination: Home Follow Up Recommendations: Home health OT;Outpatient OT Equipment Recommended: To be determined Equipment Details: Patient lives in independent living complex - has option to increase personal care assistant   OT Evaluation Precautions/Restrictions  Precautions Precautions: Fall Precaution Comments: Left neglect Restrictions Weight Bearing Restrictions: No Pain Pain Assessment Pain Scale: 0-10 Pain Score: 6  Pain Type: Chronic pain Pain Location: Rib cage Pain Orientation: Lower Pain Descriptors / Indicators: Aching Pain Onset: With Activity Patients Stated Pain Goal: 0 Pain Intervention(s): Repositioned Multiple Pain Sites: No Home Living/Prior Functioning Home Living Family/patient expects to be discharged to:: Other (Comment) Living Arrangements: Alone Available Help at Discharge: Personal care attendant, Available PRN/intermittently Type of Home: Independent living facility Home Access: Level entry Home Layout: One level Bathroom Shower/Tub: Multimedia programmer: Handicapped height Bathroom Accessibility: Yes Additional Comments: likes to play Bridge  Lives With: Alone IADL History Education: 4 years college Prior Function Vocation: Retired Surveyor, mining Baseline Vision/History: 1 Wears glasses Ability to See in Adequate Light: 1 Impaired Patient Visual Report: Other (comment) (patient unaware of visual changes) Vision Assessment?: Yes Eye Alignment: Impaired (comment) Ocular Range of Motion: Restricted on the left Alignment/Gaze Preference: Gaze right Tracking/Visual Pursuits: Impaired - to be further tested in functional context Saccades: Impaired - to be further tested in functional context Convergence: Impaired (comment) Visual Fields: Left visual field  deficit;Impaired-to be further tested in functional context Perception  Perception: Impaired Inattention/Neglect: Does not attend to left side of body;Does not attend to left visual field Praxis Praxis: Impaired Praxis Impairment Details: Initiation;Ideomotor;Motor planning Cognition Cognition Overall Cognitive Status: Impaired/Different from baseline Arousal/Alertness: Awake/alert Orientation Level: Person;Place;Situation Person: Oriented Place: Oriented Situation: Oriented Memory: Impaired Memory Impairment: Decreased recall of new information;Decreased short term memory;Retrieval deficit Decreased Short Term Memory: Functional basic;Verbal basic Attention: Focused Focused Attention: Impaired Focused Attention Impairment: Functional basic Sustained Attention: Impaired Sustained Attention Impairment: Functional basic Awareness: Impaired Awareness Impairment: Intellectual impairment Problem Solving: Impaired Problem Solving Impairment: Functional basic Executive Function: Initiating;Self Monitoring;Organizing Sequencing: Impaired Sequencing Impairment: Functional basic Organizing: Impaired Organizing Impairment: Functional basic Initiating: Impaired Initiating Impairment: Functional basic Self Monitoring: Impaired Self Monitoring Impairment: Functional basic Behaviors: Poor frustration tolerance Safety/Judgment: Impaired Comments: left neglect - pushes toward left Brief Interview for Mental Status (BIMS) Repetition of Three Words (First Attempt): 3 Temporal Orientation: Year: Correct Temporal Orientation: Month: Accurate within 5 days Temporal Orientation: Day: Correct Recall: "Sock": Yes, no cue required Recall: "Blue": Yes, no cue required Recall: "Bed": Yes, after cueing ("a piece of furniture") BIMS Summary Score: 14 Sensation Sensation Light Touch: Impaired by gross assessment Hot/Cold: Impaired by gross assessment Proprioception: Impaired by gross  assessment Stereognosis: Not tested Additional Comments: Difficulty locating arm on body Coordination Gross Motor Movements are Fluid and Coordinated: No Fine Motor Movements are Fluid and Coordinated: No Motor  Motor Motor: Abnormal tone;Abnormal postural alignment and control;Motor impersistence;Hemiplegia Motor - Skilled Clinical Observations: Dense left hemiplegia, hypotonic LUE  Trunk/Postural Assessment  Cervical Assessment Cervical Assessment: Exceptions to [facility] Thoracic Assessment Thoracic Assessment: Exceptions to [facility] Lumbar Assessment Lumbar Assessment: Exceptions to [facility] Postural Control Postural Control: Deficits on evaluation Head Control: Forward head rotated right Trunk Control: Posterior left bias, left lateral flexion  Balance Balance Balance Assessed: Yes Static Sitting Balance Static Sitting - Balance Support: Right upper extremity supported;Feet supported Static Sitting - Level of Assistance: 3: Mod assist Dynamic Sitting Balance Dynamic Sitting - Balance  Support: Right upper extremity supported;Feet supported;During functional activity Dynamic Sitting - Level of Assistance: 2: Max assist Dynamic Sitting - Balance Activities: Forward lean/weight shifting;Lateral lean/weight shifting;Head control activities;Trunk control activities Sitting balance - Comments: pushes to weaker left side Static Standing Balance Static Standing - Balance Support: Right upper extremity supported;During functional activity Static Standing - Level of Assistance: 2: Max assist Extremity/Trunk Assessment RUE Assessment RUE Assessment: Exceptions to [facility] Active Range of Motion (AROM) Comments: Observed 80* of flexion - needs further assessment LUE Assessment LUE Assessment: Exceptions to [facility] Active Range of Motion (AROM) Comments: Edematous and discolored LUE.  Has delayed elbow and digit flexion through 20% available range.  Need further assessment General Strength Comments: NT LUE  Body System: Neuro Brunstrum levels for arm and hand: Arm;Hand Brunstrum level for arm: Stage I Presynergy Brunstrum level for hand: Stage II Synergy is developing  Care Tool Care Tool Self Care Eating Eating activity did not occur: Safety/medical concerns      Oral Care    Oral Care Assist Level: Maximal assistance - Patient 25 - 49%    Bathing   Body parts bathed by patient: Chest;Abdomen;Right upper leg;Face   Body parts n/a: Front perineal area;Buttocks;Right lower leg;Left lower leg;Left upper leg;Left arm;Right arm Assist Level: Maximal Assistance - Patient 24 - 49%    Upper Body Dressing(including orthotics)   What is the patient wearing?: Bra;Button up shirt   Assist Level: Total Assistance - Patient < 25%    Lower Body Dressing (excluding footwear)   What is the patient wearing?: Incontinence brief;Pants Assist for lower body dressing: Total Assistance - Patient < 25%    Putting on/Taking off footwear   What is the patient wearing?: Non-skid slipper socks Assist for footwear: Dependent - Patient 0%       Care Tool Toileting Toileting activity Toileting Activity did not occur (Clothing management and hygiene only): N/A (no void or bm) Assist for toileting: Moderate Assistance - Patient 50 - 74%     Care Tool Bed Mobility Roll left and right activity   Roll left and right assist level: Maximal Assistance - Patient 25 - 49%    Sit to lying activity   Sit to lying assist level: Maximal Assistance - Patient 25 - 49%    Lying to sitting on side of bed activity   Lying to sitting on side of bed assist level: the ability to move from lying on the back to sitting on the side of the bed with no back support.: Maximal Assistance - Patient 25 - 49%     Care Tool Transfers Sit to stand transfer   Sit to stand assist level: Maximal Assistance - Patient 25 - 49%    Chair/bed transfer   Chair/bed transfer assist level: Total Assistance - Patient < 25%     Toilet  transfer   Assist Level: Total Assistance - Patient < 25%     Care Tool Cognition  Expression of Ideas and Wants Expression of Ideas and Wants: 3. Some difficulty - exhibits some difficulty with expressing needs and ideas (e.g, some words or finishing thoughts) or speech is not clear  Understanding Verbal and Non-Verbal Content Understanding Verbal and Non-Verbal Content: 3. Usually understands - understands most conversations, but misses some part/intent of message. Requires cues at times to understand   Memory/Recall Ability Memory/Recall Ability : Current season;That he or she is in a hospital/hospital unit   Refer to Care Plan for [place]  WEEK 1 OT Short Term Goal 1 (Week 1): Patient will sit at edge of bed with min assist x 30 seconds in preparation for level surface trasnfer OT Short Term Goal 2 (Week 1): Patient will visually locate wheelchair placed to left of midline with mod cueing prior to transfer OT Short Term Goal 3 (Week 1): Patient will trasnfer from bed to wheelchair with mod assist in preparation for completing bathing/ hygiene at sink. OT Short Term Goal 4 (Week 1): Patient will physically locate left arm on body and position in preparation for rolling or trasnfer with mod cueing and min assist OT Short Term Goal 5 (Week 1): Patient will attend to self in mirror (midline)with min prompting x 10-15 seconds when completing am ADL  Recommendations for other services: None    Skilled Therapeutic Intervention Patient received supine in bed - gaze right - delayed head/ eye turn to left with overt cueing.  Daughter at bedside.  Patient pleasant and agreeable to get up for therapy.  Patient completed ADL as noted.  Patient with pushing tendencies, and reports of pain with active transfers.  Patient hopes to return to prior living situation, daughter reports caregiver cannot provide physical assistance.  Anticipate patient will need physical assistance at  discharge - shared with daughter/ patient. Patient lethargic toward end of session.  Assisted back to bed, bed alarm engaged and call bell placed within reach.   ADL ADL Eating: Not assessed Grooming: Maximal assistance Where Assessed-Grooming: Sitting at sink Upper Body Bathing: Maximal assistance Where Assessed-Upper Body Bathing: Sitting at sink Lower Body Bathing: Maximal assistance Where Assessed-Lower Body Bathing: Sitting at sink Upper Body Dressing: Dependent Where Assessed-Upper Body Dressing: Sitting at sink Lower Body Dressing: Dependent Where Assessed-Lower Body Dressing: Sitting at sink Toileting: Unable to assess Toilet Transfer: Unable to assess Toilet Transfer Method: Unable to assess Tub/Shower Transfer: Unable to assess Tub/Shower Transfer Method: Unable to assess Social research officer, government: Unable to assess Social research officer, government Method: Unable to assess ADL Comments: Patient with significant pain with all movement - decreased postural control, pushing to weaker left side    Discharge Criteria: Patient will be discharged from OT if patient refuses treatment 3 consecutive times without medical reason, if treatment goals not met, if there is a change in medical status, if patient makes no progress towards goals or if patient is discharged from hospital.  The above assessment, treatment plan, treatment alternatives and goals were discussed and mutually agreed upon: by patient and by family  Mariah Milling 05/06/2022, 4:03 PM

## 2022-05-06 NOTE — Progress Notes (Signed)
Inpatient Rehabilitation  Patient information reviewed and entered into eRehab system by Desiraye Rolfson Jamilia Jacques, OTR/L, Rehab Quality Coordinator.   Information including medical coding, functional ability and quality indicators will be reviewed and updated through discharge.   

## 2022-05-06 NOTE — Evaluation (Signed)
Speech Language Pathology Assessment and Plan  Patient Details  Name: Kara [name] MRN: [id number] Date of Birth: 09/30/1922  SLP Diagnosis: Dysphagia;Cognitive Impairments;Dysarthria  Rehab Potential: Good ELOS: 14-16 days   Today's Date: 05/06/2022 SLP Individual Time: 0901-1000 SLP Individual Time Calculation (min): 59 min  Hospital Problem: Principal Problem:   Right middle cerebral artery stroke [facility])  Past Medical History:  Past Medical History:  Diagnosis Date   Allergy    Atrial fibrillation ([place])    CAD (coronary artery disease)    Cancer (HCC)    UTERINE   Chronic anticoagulation    Chronic diarrhea    Closed compression fracture of L2 lumbar vertebra, initial encounter ([place]) 11/08/2020   By CT scan done to evaluate severe persistent low back pain:  Impression below also notes spinal compression from bulging disk.     Superior endplate compression fracture of L2 with approximately 25% height loss and minimal, 2-3 mm retropulsion of the superior endplate.   Left-sided, nondisplaced zone 1 sacral insufficiency fracture.   Multilevel degenerative changes of the lumbar spine resulting    Closed intertrochanteric fracture of left femur ([place]) 08/22/2020   Decubitus ulcer    sacral region   Diabetes ([place])    diet controlled   Dysuria    E. coli UTI 11/21/2014   Edema of lower extremity    mainly right foot, slightly in left foot.   Facial fracture due to fall ([place]) 07/13/2020   Femur fracture, left ([place]) 06/09/2019   Fibrocystic breast disease    GERD (gastroesophageal reflux disease)    Glaucoma    Glaucoma    Hematuria    Hemorrhoids    History of colon polyps    History of pancreatitis    Hospital discharge follow-up 07/22/2020   Hyperlipidemia    Hypertension    Hypokalemia    IBS (irritable bowel syndrome)    Microscopic hematuria    Mitral valve regurgitation    Orbital fracture ([place]) 07/13/2020   Osteoarthritis    fingers   Pernicious anemia    Plantar  fasciitis    Recurrent UTI    Skin cancer    Sleep apnea, obstructive    Umbilical hernia without obstruction and without gangrene 06/26/2015   Vaginal atrophy    Vitamin D deficiency    Yeast vaginitis    Past Surgical History:  Past Surgical History:  Procedure Laterality Date   ABDOMINAL HYSTERECTOMY  1980's   ABDOMINAL SURGERY     for villous polyp,,,many years ago   APPENDECTOMY  1940's   ASCAD, s/p PTCA  11/28/2005   MID LESION    BREAST BIOPSY Left 1970's   CARPAL TUNNEL RELEASE Right 12/13/2015   Procedure: CARPAL TUNNEL RELEASE;  Surgeon: Thornton Park, MD;  Location: [facility] ORS;  Service: Orthopedics;  Laterality: Right;   COLECTOMY  2015   INTRAMEDULLARY (IM) NAIL INTERTROCHANTERIC Left 06/11/2019   Procedure: INTRAMEDULLARY (IM) NAIL INTERTROCHANTRIC;  Surgeon: Earnestine Leys, MD;  Location: [facility] ORS;  Service: Orthopedics;  Laterality: Left;   IR CT HEAD LTD  05/01/2022   IR PERCUTANEOUS ART THROMBECTOMY/INFUSION INTRACRANIAL INC DIAG ANGIO  05/01/2022   IR US GUIDE VASC ACCESS RIGHT  05/01/2022   KYPHOPLASTY N/A 11/15/2020   Procedure: Hewitt Shorts;  Surgeon: Hessie Knows, MD;  Location: [facility] ORS;  Service: Orthopedics;  Laterality: N/A;   PACEMAKER PLACEMENT     radation     for uterine cance   RADIOLOGY WITH ANESTHESIA N/A 05/01/2022   Procedure:  IR WITH ANESTHESIA;  Surgeon: Radiologist, Medication, MD;  Location: [place];  Service: Radiology;  Laterality: N/A;   REFRACTIVE SURGERY     for bilateral glaumoma   SACROPLASTY N/A 11/15/2020   Procedure: SACROPLASTY;  Surgeon: Hessie Knows, MD;  Location: [facility] ORS;  Service: Orthopedics;  Laterality: N/A;   TONSILLECTOMY  1936    Assessment / Plan / Recommendation Clinical Impression   Kara [name] is a 88 year old right-handed female history of atrial fibrillation/pacemaker not on anticoagulation, CAD, diet controlled diabetes mellitus, hypertension, hyperlipidemia, uterine cancer, history of L2 compression  fracture. Patient resides lives alone in an independent living facility/[facility]. She has a PCA who assists with groceries and cleaning.  Presented 05/01/2022 with acute onset of left-sided weakness, right gaze deviation and dysarthria.  Blood pressure elevated 200s/100s.  Cranial CT scan showed age-indeterminate possibly acute infarct right cerebellum. Patient received TNK and revascularization/thrombectomy per interventional radiology. Currently on dysphagia #3 thin liquid diet.  Therapy evaluations completed due to patient decreased functional mobility left-sided weakness was admitted for a comprehensive rehab program.   Pt presents with moderate cognitive impairment as evidenced by SLUMS score of 14/30, however patient and daughter report patient is not far off of baseline. Pt oriented to [facility] and year, able to state "they say I had a stroke but I don't know". Pt is not oriented to DOW and daughter reports patient at times believes she is back at her ILF facility and not aware of being in hospital. Pt demonstrates decreased awareness into physical and cognitive changes s/p CVA and has significant left neglect impacting safety with functional tasks. Pt able to recall 3/5 words with few minute delay, can reverse 2/3 digits and recall 50% of paragraph elements. Speech intelligibility has improved since admission and is ~85% at phrase and simple sentence level. Pt is 54 living in an ILF facility and spends most of the day napping in her recliner. Pt is responsible for medication and financial management which daughter is hopeful she can maintain. Pt has an aide that comes as needed.  Pt is currently tolerating a Dys 3, thin liquid diet however pt and daughter report very poor PO intake (MD aware and prescribing Zofran). Pt seen with Dys 3 solids and regular solids, demonstrating extended oral phase with both consistencies complicated by severe dry mouth. Pt benefits from lingual sweep and liquid assistance to  clear mild oral stasis after initial swallow. Pt with 1 episode cough with regular, dry solids and no difficulty with any intake of thin liquid via cup, single or consecutive straw sips. Pt's daughter primarily concerned with pt poor PO intake at this time, agreeable to continue on Dys 3 diet with full supervision at mealtime to increase PO intake. Pt will benefit from skilled ST during CIR to increase safety and independence with daily routine and determine safest and least restrictive diet.    Skilled Therapeutic Interventions          Pt participating in Bedside Swallow Evaluation, SLUMS exam and further nonstandardized assessments of speech, language and cognition. Please see above.   SLP Assessment  Patient will need skilled Speech Lanaguage Pathology Services during CIR admission    Recommendations  SLP Diet Recommendations: Dysphagia 3 (Mech soft);Thin Liquid Administration via: Cup;Straw Medication Administration: Whole meds with liquid (puree if needed for larger pills) Supervision: Patient able to self feed;Staff to assist with self feeding;Full supervision/cueing for compensatory strategies Compensations: Minimize environmental distractions;Slow rate;Small sips/bites;Lingual sweep for clearance of pocketing;Monitor for anterior loss;Follow  solids with liquid Postural Changes and/or Swallow Maneuvers: Seated upright 90 degrees Oral Care Recommendations: Oral care BID Patient destination: Home (ILF) Follow up Recommendations: Outpatient SLP;24 hour supervision/assistance Equipment Recommended: To be determined    SLP Frequency 3 to 5 out of 7 days   SLP Duration  SLP Intensity  SLP Treatment/Interventions 14-16 days  Minumum of 1-2 x/day, 30 to 90 minutes  Cognitive remediation/compensation;Dysphagia/aspiration precaution training;Internal/external aids;Therapeutic Activities;Therapeutic Exercise;Patient/family education;Functional tasks;Cueing hierarchy    Pain Pain  Assessment Pain Scale: 0-10 Pain Score: 0-No pain  Prior Functioning Cognitive/Linguistic Baseline: Within functional limits Type of Home: Independent living facility  Lives With: Alone Available Help at Discharge: Personal care attendant;Available PRN/intermittently Education: 4 years college Vocation: Retired  Programmer, systems Overall Cognitive Status: Impaired/Different from baseline Arousal/Alertness: Awake/alert Orientation Level: Oriented to person;Oriented to situation;Disoriented to place Year: 2023 Month: December Day of Week: Incorrect Attention: Sustained Sustained Attention: Appears intact Memory: Impaired Memory Impairment: Decreased recall of new information;Decreased short term memory Decreased Short Term Memory: Verbal basic Awareness: Impaired Awareness Impairment: Intellectual impairment;Emergent impairment;Anticipatory impairment Problem Solving: Impaired Problem Solving Impairment: Functional basic (complicated by L neglect) Executive Function: Organizing;Self Monitoring Sequencing: Impaired Sequencing Impairment: Functional basic;Verbal basic Self Monitoring: Impaired Self Monitoring Impairment: Verbal basic;Functional basic Safety/Judgment: Impaired Comments: very inattentive to her L side  Comprehension Auditory Comprehension Overall Auditory Comprehension: Appears within functional limits for tasks assessed Expression Expression Primary Mode of Expression: Verbal Verbal Expression Overall Verbal Expression: Appears within functional limits for tasks assessed Oral Motor Oral Motor/Sensory Function Overall Oral Motor/Sensory Function: Mild impairment Facial ROM: Reduced left;Suspected CN VII (facial) dysfunction Facial Symmetry: Abnormal symmetry left;Suspected CN VII (facial) dysfunction Facial Strength: Reduced left;Suspected CN VII (facial) dysfunction Facial Sensation: Suspected CN V (Trigeminal) dysfunction;Reduced left Lingual  ROM: Within Functional Limits Lingual Symmetry: Within Functional Limits Lingual Strength: Within Functional Limits Motor Speech Overall Motor Speech: Impaired Respiration: Within functional limits Phonation: Normal Resonance: Within functional limits Articulation: Impaired Level of Impairment: Conversation Intelligibility: Intelligible  Care Tool Care Tool Cognition Ability to hear (with hearing aid or hearing appliances if normally used Ability to hear (with hearing aid or hearing appliances if normally used): 0. Adequate - no difficulty in normal conservation, social interaction, listening to TV   Expression of Ideas and Wants Expression of Ideas and Wants: 3. Some difficulty - exhibits some difficulty with expressing needs and ideas (e.g, some words or finishing thoughts) or speech is not clear   Understanding Verbal and Non-Verbal Content Understanding Verbal and Non-Verbal Content: 3. Usually understands - understands most conversations, but misses some part/intent of message. Requires cues at times to understand  Memory/Recall Ability Memory/Recall Ability : Current season   PMSV Assessment  PMSV Trial Intelligibility: Intelligible  Bedside Swallowing Assessment General Date of Onset: 05/02/22 Previous Swallow Assessment: BSE in acute Diet Prior to this Study: Dysphagia 3 (soft);Thin liquids Temperature Spikes Noted: No Respiratory Status: Room air History of Recent Intubation: No Behavior/Cognition: Cooperative;Alert;Pleasant mood Oral Cavity - Dentition: Adequate natural dentition;Missing dentition Self-Feeding Abilities: Needs set up;Needs assist Vision: Impaired for self-feeding Patient Positioning: Upright in bed Volitional Cough: Strong Volitional Swallow: Able to elicit  Oral Care Assessment Oral Assessment  (WDL): Exceptions to WDL Lips: Asymmetrical Teeth: Intact Tongue: Pink;Dry Mucous Membrane(s): Pink;Moist Saliva: Insufficient Level of Consciousness:  Alert Is patient on any of following O2 devices?: None of the above Nutritional status: Dysphagia Oral Assessment Risk : High Risk Ice Chips Ice chips: Within functional limits Thin Liquid Thin Liquid: Within  functional limits Presentation: Cup;Self Fed;Straw Nectar Thick Nectar Thick Liquid: Not tested Honey Thick Honey Thick Liquid: Not tested Puree Puree: Within functional limits Solid Solid: Impaired Presentation: Self Fed Oral Phase Impairments: Impaired mastication Oral Phase Functional Implications: Prolonged oral transit;Oral residue (c/b severe dry mouth) BSE Assessment Risk for Aspiration Impact on safety and function: Mild aspiration risk Other Related Risk Factors: Cognitive impairment  Short Term Goals: Week 1: SLP Short Term Goal 1 (Week 1): Pt will utilize speech intelligibility strategies (A BOSS) with Supervision A cues for 90% intelligibility at sentence level SLP Short Term Goal 2 (Week 1): Pt will increase orientation to place, time and reason for hospitalization with Supervision A verbal and visual cues SLP Short Term Goal 3 (Week 1): Pt will increase recall of simple, functional information with min A verbal cues for 80% accuracy SLP Short Term Goal 4 (Week 1): With mod A multimodal cues, pt will scan to midline and/or left of midline during functional tasks SLP Short Term Goal 5 (Week 1): Pt will comple simple problem solving tasks with min A verbal cues SLP Short Term Goal 6 (Week 1): Pt will ID 3 physical and 2 cognitive changes s/p CVA to increase awareness into current deficits  Refer to Care Plan for Long Term Goals  Recommendations for other services: None   Discharge Criteria: Patient will be discharged from SLP if patient refuses treatment 3 consecutive times without medical reason, if treatment goals not met, if there is a change in medical status, if patient makes no progress towards goals or if patient is discharged from hospital.  The above  assessment, treatment plan, treatment alternatives and goals were discussed and mutually agreed upon: by patient and by family  Dewaine Conger 05/06/2022, 12:17 PM

## 2022-05-06 NOTE — Progress Notes (Signed)
Inpatient Rehabilitation Admission Medication Review by a Pharmacist  A complete drug regimen review was completed for this patient to identify any potential clinically significant medication issues.  High Risk Drug Classes Is patient taking? Indication by Medication  Antipsychotic No   Anticoagulant Yes Apixaban - Stroke   Antibiotic No   Opioid Yes Ultram - prn pain  Antiplatelet No   Hypoglycemics/insulin Yes SSI - DM  Vasoactive Medication Yes Metoprolol - BP  Chemotherapy No   Other Yes Estradiol vaginal cream-prn dryness  Lidocaine patch - pain Zofran - prn N/V Pantoprazole - Reflux     Type of Medication Issue Identified Description of Issue Recommendation(s)  Drug Interaction(s) (clinically significant)     Duplicate Therapy     Allergy     No Medication Administration End Date     Incorrect Dose     Additional Drug Therapy Needed     Significant med changes from prior encounter (inform family/care partners about these prior to discharge).    Other       Clinically significant medication issues were identified that warrant physician communication and completion of prescribed/recommended actions by midnight of the next day:  No  Pharmacist comments: None   Time spent performing this drug regimen review (minutes):  30 minutes  Thank you Anette Guarneri, PharmD

## 2022-05-06 NOTE — Progress Notes (Signed)
Patient ID: Kara [name], female   DOB: 12-02-1922, 88 y.o.   MRN: [id number] Met with the patient and daughter to review current situation, rehab process, team conference and plan of care. Reviewed secondary risks including DM, (A1C 6.8), HTN, HLD and A-fib on Eliquis. Discussed dietary modifications and dysphagia diet restrictions; dtr noted patient not eating, food doesn't taste good, trying multiple items and concerned about poor intake given potassium, calcium and vit D levels. Continue to follow along to address educational needs to facilitate preparation for discharge. Margarito Liner

## 2022-05-07 DIAGNOSIS — I63511 Cerebral infarction due to unspecified occlusion or stenosis of right middle cerebral artery: Secondary | ICD-10-CM | POA: Diagnosis not present

## 2022-05-07 LAB — GLUCOSE, CAPILLARY
Glucose-Capillary: 133 mg/dL — ABNORMAL HIGH (ref 70–99)
Glucose-Capillary: 138 mg/dL — ABNORMAL HIGH (ref 70–99)
Glucose-Capillary: 134 mg/dL — ABNORMAL HIGH (ref 70–99)
Glucose-Capillary: 139 mg/dL — ABNORMAL HIGH (ref 70–99)

## 2022-05-07 MED ORDER — FLUCONAZOLE 100 MG PO TABS
200.0000 mg | ORAL_TABLET | Freq: Once | ORAL | Status: AC
  Administered 2022-05-07: 200 mg via ORAL
  Filled 2022-05-07: qty 2

## 2022-05-07 MED ORDER — PANTOPRAZOLE SODIUM 40 MG PO TBEC
40.0000 mg | DELAYED_RELEASE_TABLET | Freq: Every day | ORAL | Status: DC
  Administered 2022-05-07 – 2022-05-21 (×13): 40 mg via ORAL
  Filled 2022-05-07 (×15): qty 1

## 2022-05-07 NOTE — Plan of Care (Signed)
Problem: RH Balance Goal: LTG Patient will maintain dynamic sitting balance (PT) Description: LTG:  Patient will maintain dynamic sitting balance with assistance during mobility activities (PT) Flowsheets (Taken 05/06/2022 1808) LTG: Pt will maintain dynamic sitting balance during mobility activities with:: Supervision/Verbal cueing Goal: LTG Patient will maintain dynamic standing balance (PT) Description: LTG:  Patient will maintain dynamic standing balance with assistance during mobility activities (PT) Flowsheets (Taken 05/06/2022 1808) LTG: Pt will maintain dynamic standing balance during mobility activities with:: Minimal Assistance - Patient > 75%   Problem: Sit to Stand Goal: LTG:  Patient will perform sit to stand with assistance level (PT) Description: LTG:  Patient will perform sit to stand with assistance level (PT) Flowsheets (Taken 05/06/2022 1808) LTG: PT will perform sit to stand in preparation for functional mobility with assistance level: Contact Guard/Touching assist   Problem: RH Bed Mobility Goal: LTG Patient will perform bed mobility with assist (PT) Description: LTG: Patient will perform bed mobility with assistance, with/without cues (PT). Flowsheets (Taken 05/06/2022 1808) LTG: Pt will perform bed mobility with assistance level of: Contact Guard/Touching assist   Problem: RH Bed to Chair Transfers Goal: LTG Patient will perform bed/chair transfers w/assist (PT) Description: LTG: Patient will perform bed to chair transfers with assistance (PT). Flowsheets (Taken 05/06/2022 1808) LTG: Pt will perform Bed to Chair Transfers with assistance level: Contact Guard/Touching assist   Problem: RH Car Transfers Goal: LTG Patient will perform car transfers with assist (PT) Description: LTG: Patient will perform car transfers with assistance (PT). Flowsheets (Taken 05/06/2022 1808) LTG: Pt will perform car transfers with assist:: Minimal Assistance - Patient > 75%    Problem: RH Furniture Transfers Goal: LTG Patient will perform furniture transfers w/assist (OT/PT) Description: LTG: Patient will perform furniture transfers  with assistance (OT/PT). Flowsheets (Taken 05/06/2022 1808) LTG: Pt will perform furniture transfers with assist:: Minimal Assistance - Patient > 75%   Problem: RH Ambulation Goal: LTG Patient will ambulate in controlled environment (PT) Description: LTG: Patient will ambulate in a controlled environment, # of feet with assistance (PT). Flowsheets (Taken 05/06/2022 1808) LTG: Pt will ambulate in controlled environ  assist needed:: Minimal Assistance - Patient > 75% LTG: Ambulation distance in controlled environment: 75 ft using LRAD Goal: LTG Patient will ambulate in home environment (PT) Description: LTG: Patient will ambulate in home environment, # of feet with assistance (PT). Flowsheets (Taken 05/06/2022 1808) LTG: Pt will ambulate in home environ  assist needed:: Minimal Assistance - Patient > 75% LTG: Ambulation distance in home environment: up to 40 ft using LRAD   Problem: RH Wheelchair Mobility Goal: LTG Patient will propel w/c in home environment (PT) Description: LTG: Patient will propel wheelchair in home environment, # of feet with assistance (PT). Flowsheets (Taken 05/06/2022 1808) LTG: Pt will propel w/c in home environ  assist needed:: Contact Guard/Touching assist Distance: wheelchair distance in controlled environment: 100 LTG: Propel w/c distance in home environment: up to 40 ft Goal: LTG Patient will propel w/c in community environment (PT) Description: LTG: Patient will propel wheelchair in community environment, # of feet with assist (PT) Flowsheets (Taken 05/06/2022 1808) LTG: Pt will propel w/c in community environ  assist needed:: Contact Guard/Touching assist [id number] w/c distance in community environment: at least 100 ft   Problem: RH Stairs Goal: LTG Patient will ambulate up and down stairs  w/assist (PT) Description: LTG: Patient will ambulate up and down # of stairs with assistance (PT) Flowsheets (Taken 05/06/2022 1808) LTG: Pt will ambulate up/down stairs assist  needed:: Moderate Assistance - Patient 50 - 74% LTG: Pt will  ambulate up and down number of stairs: 3-4 steps using HR as per transport bus at [facility] in order to go to MD appointments

## 2022-05-07 NOTE — Progress Notes (Signed)
Physical Therapy Session Note  Patient Details  Name: SUANN KLIER MRN: [id number] Date of Birth: April 09, 1923  {CHL IP REHAB PT TIME CALCULATION:[id number]}  Short Term Goals: {WGN:[id number]}  Skilled Therapeutic Interventions/Progress Updates:      Therapy Documentation Precautions:  Precautions Precautions: Fall Precaution Comments: L hemipareisis with L neglect, intermittent pusher Restrictions Weight Bearing Restrictions: No General:   Vital Signs: Therapy Vitals Temp: 98.2 F (36.8 C) Temp Source: Oral Pulse Rate: 82 Resp: 18 BP: 139/64 Patient Position (if appropriate): Lying Oxygen Therapy SpO2: 92 % O2 Device: Room Air Pain:   Mobility:   Locomotion :    Trunk/Postural Assessment :    Balance:   Exercises:   Other Treatments:      Therapy/Group: {Therapy/Group:[id number]}  Alger Simons 05/07/2022, 4:28 PM

## 2022-05-07 NOTE — Progress Notes (Signed)
Physical Therapy Session Note  Patient Details  Name: Kara [name] MRN: [id number] Date of Birth: 01-30-1923  Today's Date: 05/07/2022 PT Individual Time:  1504-1601  PT Individual Time Calculation (min): 57 min  Short Term Goals: Week 1:  PT Short Term Goal 1 (Week 1): Pt will perform bed mobility with consistent ModA and improved initiation. PT Short Term Goal 2 (Week 1): Pt will perform sit<>stand with consistent ModA PT Short Term Goal 3 (Week 1): Pt will improve sitting balance to find midline and hold sitting balance for at least with overall Min/ ModA. PT Short Term Goal 4 (Week 1): Pt will tolerate standing with MaxA for at least 30sec.  Skilled Therapeutic Interventions/Progress Updates:  Patient supine in bed on entrance to room. Patient alert and agreeable to PT session. Dtr and caregiver, Thomasenia Sales, are in room.   Patient with no pain complaint at start of session. Appears to be more engaged and alert this session.   Discussion with pt re: increased pain in BLE with gentle touching from staff. Education provided re: stroke affecting nerve sensitivity and brains interpretation of touch. Pt looks at legs and states, "shut up, nerves." Repeats throughout session. Acknowledged with pt throughout session prior to touch to BLE of intent of movement. Reduced pain related from pt.   Therapeutic Activity: Bed Mobility: Pt performed supine --> sit with good initiation of BLE with vc and is able to bring Bil feet to EOB with consistent encouragement and MinA. Completes transition to roll to R side with Mod/ MaxA. Inattentive of LUE. Is able to push with RUE up to elbow to bring UB to upright seated position. LOB to L  requiring ModA to maintain seated position. CGA for sit<>stand from perch position then MaxA to maintain upright positioning. At end of session, pt requires MaxA to return to supine and VC/ tc required for sequencing technique throughout. Transfers: Pt performed sit<>stand at  [facility] with ModA and vc to remain standing with RUE on STEDY bar. Pt unable to maintain safety with continued reduced attn to need to use RUE to maintain balance. Demos forward flexion and LOB to L with intermittent pushing from RUE.   Neuromuscular Re-ed: NMR facilitated during session with focus on seated and standing balance. While in STEDY, attempt to utilize mirror in room for improved proprioception and midline orientation. Pt requires assist to find mirror and unable to identify self or orientation. Brief periods of ability to self correct LOB with vc to lean to R. MaxA overall to maintain upright position in STEDY.  Pt guided in seated lean to R and is unable to maintain for long requiring ModA to maintain balance. NMR performed for improvements in motor control and coordination, balance, sequencing, judgement, and self confidence/ efficacy in performing all aspects of mobility at highest level of independence.   Patient supine in bed at end of session with brakes locked, bed alarm set, and all needs within reach.   Therapy Documentation Precautions:  Precautions Precautions: Fall Precaution Comments: L hemipareisis with L neglect, intermittent pusher Restrictions Weight Bearing Restrictions: No General:   Vital Signs: Therapy Vitals Temp: 98.2 F (36.8 C) Temp Source: Oral Pulse Rate: 82 Resp: 18 BP: 139/64 Patient Position (if appropriate): Lying Oxygen Therapy SpO2: 92 % O2 Device: Room Air Pain:  Decreased pain with mobility this session.   Therapy/Group: Individual Therapy  Loel Dubonnet 05/07/2022, 2:01 PM

## 2022-05-07 NOTE — Patient Care Conference (Signed)
Inpatient RehabilitationTeam Conference and Plan of Care Update Date: 05/07/2022   Time: 10:05 AM    Patient Name: Kara [name]      Medical Record Number: [id number]  Date of Birth: [birth year] Sex: Female         Room/Bed: 4W21C/4W21C-01 Payor Info: Payor: MEDICARE / Plan: MEDICARE PART A AND B / Product Type: *No Product type* /    Admit Date/Time:  05/05/2022  4:04 PM  Primary Diagnosis:  Right middle cerebral artery stroke [facility])  Hospital Problems: Principal Problem:   Right middle cerebral artery stroke [facility])    Expected Discharge Date: Expected Discharge Date: 05/22/22  Team Members Present: Physician leading conference: Dr. Alysia Penna Social Worker Present: Erlene Quan, BSW Nurse Present: Dorien Chihuahua, RN PT Present: Barrie Folk, PT OT Present: Meriel Pica, OT SLP Present: Charolett Bumpers, SLP PPS Coordinator present : Gunnar Fusi, SLP     Current Status/Progress Goal Weekly Team Focus  Bowel/Bladder   pt continent of b/b with urgency   Remain continent   Assist with toileting qshift and prn    Swallow/Nutrition/ Hydration   Dys 3/thin - very poor appetite and intake   Supervision A  tolerance of dys 3/thin, increasing PO, increasing awareness of oral phase impairments, trials of regular    ADL's   Max/total assist with BADL   Mod/min assist   Postural control, midline orientation, Left attention, attention in general, pain control    Mobility   intermittent pusher, fatigues easily; bed mobility = Mod/ MaxA, transfers = MaxA, no ambulation yet   overall MinA  L NMR, sitting balance, standing balance/ tolerance, proprioception and midline orientation, L attention, transfers, ambulation    Communication                Safety/Cognition/ Behavioral Observations  14/30 SLUMS - pt and family report pt close to or at baseline   Min A - Sup A simple cog   scan left, intellectual awareness of deficits, functional recall and PS,  orientation    Pain   no c/o pain   Remain pain free   Assess pain qshift and prn    Skin   Skin intact, bruising bilateral on arms   Keep skin intact and free from infection  Assess qshift and prn      Discharge Planning:  Daughter anticpates patient will be able to return to idependent living. ALF and SNF options avaliable if needed.   Team Discussion: Patient with left hemi plegia/spasticity; mild pusher suyndrome and sensitivity issues left side post right MCA CVA. Currently on D3 thin liquid diet with poor intake; reports food does not taste good. Cognition close to baseline; still needs cues to scan left.   Patient on target to meet rehab goals: yes, currently needs max assist - total for ADLs and max assist for transfers. Working on Press photographer, orientation and problem solving. Goals for discharge set for mi - mod assist overall.  *See Care Plan and progress notes for long and short-term goals.   Revisions to Treatment Plan:  Trial D3 diet   Teaching Needs: Safety, medications, dietary modifications, transfers, toileting, etc  Current Barriers to Discharge: Lack of/limited family support  Possible Resolutions to Barriers: Family education SNF recommended     Medical Summary Current Status: poor appetite, nausea, sev ere weakness with tone  Barriers to Discharge: Medical stability   Possible Resolutions to Barriers/Weekly Focus: cont prn zofran , dose prior to meals , cont protonix for reflux,  work on Administrator, sports, consider megace   Continued Need for Acute Rehabilitation Level of Care: The patient requires daily medical management by a physician with specialized training in physical medicine and rehabilitation for the following reasons: Direction of a multidisciplinary physical rehabilitation program to maximize functional independence : Yes Medical management of patient stability for increased activity during participation in an intensive  rehabilitation regime.: Yes Analysis of laboratory values and/or radiology reports with any subsequent need for medication adjustment and/or medical intervention. : Yes   I attest that I was present, lead the team conference, and concur with the assessment and plan of the team.   Dorien Chihuahua B 05/07/2022, 3:46 PM

## 2022-05-07 NOTE — Progress Notes (Signed)
Occupational Therapy Session Note  Patient Details  Name: Kara [name] MRN: [id number] Date of Birth: 23-Jul-1922  Today's Date: 05/07/2022 OT Individual Time: 8546-2703 OT Individual Time Calculation (min): 40 min    Short Term Goals: Week 1:  OT Short Term Goal 1 (Week 1): Patient will sit at edge of bed with min assist x 30 seconds in preparation for level surface trasnfer OT Short Term Goal 2 (Week 1): Patient will visually locate wheelchair placed to left of midline with mod cueing prior to transfer OT Short Term Goal 3 (Week 1): Patient will trasnfer from bed to wheelchair with mod assist in preparation for completing bathing/ hygiene at sink. OT Short Term Goal 4 (Week 1): Patient will physically locate left arm on body and position in preparation for rolling or trasnfer with mod cueing and min assist OT Short Term Goal 5 (Week 1): Patient will attend to self in mirror (midline)with min prompting x 10-15 seconds when completing am ADL  Skilled Therapeutic Interventions/Progress Updates:    Pt received in bed with daughter and caregiver in the room. Pt in bed keeping eyes closed, but could open her eyes intermittently and answer questions. She did this throughout the session.  She had some edema in L hand. Prom to L wrist and fingers which helped reduce some of the edema.   From bed level, donned pants with total A overall but pt did participate by actively bending and straightening legs, bridging and using R hand to pull pants over hips partially.   Pt rolled to R with max A and cues to use R hand to support L arm across her body.  Total to move sidelying to sit.  Once sitting, needed mod/max A to hold sit balance due to L lean. Cued pt to use R hand on rail to support her balance so I could A her with donning bra and shirt.   Pt did show active movement of arm when cued to pull her arm back out of the sleeve of the gown and to push arm through new sleeve.  Scooting on bed toward pillows  with max A and pt helping to push through her legs. Total to move back to supine, had pt bridge to scoot hips slightly to L.  Pt adjusted in bed with pillow under L arm and all needs met.  Bed alarm set.    Therapy Documentation Precautions:  Precautions Precautions: Fall Precaution Comments: L hemipareisis with L neglect, intermittent pusher Restrictions Weight Bearing Restrictions: No   Pain:  pt very sensitive to touch and has pain with light touch on her legs     Therapy/Group: Individual Therapy  Doil Kamara 05/07/2022, 12:53 PM

## 2022-05-07 NOTE — Progress Notes (Signed)
PROGRESS NOTE   Subjective/Complaints: Some improvement in appetite after zofran No issues overnite No pain at rest ROS- neg for abd pain, occ nausea Objective:   No results found. Recent Labs    05/05/22 0816 05/06/22 0541  WBC 8.5 8.0  HGB 12.8 12.8  HCT 36.6 37.3  PLT 157 162    Recent Labs    05/05/22 0816 05/06/22 0541  NA 137 137  K 2.9* 3.5  CL 97* 96*  CO2 29 30  GLUCOSE 131* 140*  BUN 13 14  CREATININE 0.84 0.88  CALCIUM 8.3* 8.7*    No intake or output data in the 24 hours ending 05/07/22 0915       Physical Exam: Vital Signs Blood pressure 136/61, pulse 75, temperature 98.3 F (36.8 C), resp. rate 16, weight 56.7 kg, SpO2 96 %.  General: No acute distress Mood and affect are appropriate Heart: Regular rate and rhythm no rubs murmurs or extra sounds Lungs: Clear to auscultation, breathing unlabored, no rales or wheezes Abdomen: Positive bowel sounds, soft nontender to palpation, nondistended Extremities: edema and ecchymosis LUE  Skin: No evidence of breakdown, no evidence of rash Neurologic: Cranial nerves II through XII intact, motor strength is 5/5 in right and 0/5deltoid, bicep, tricep, grip, hip flexor, knee extensors, ankle dorsiflexor and plantar flexor Sensory exam reduced sensation to LT in LUE and LLE  Musculoskeletal: Full range of motion in all 4 extremities. No joint swelling   Assessment/Plan: 1. Functional deficits which require 3+ hours per day of interdisciplinary therapy in a comprehensive inpatient rehab setting. Physiatrist is providing close team supervision and 24 hour management of active medical problems listed below. Physiatrist and rehab team continue to assess barriers to discharge/monitor patient progress toward functional and medical goals  Care Tool:  Bathing    Body parts bathed by patient: Chest, Abdomen, Right upper leg, Face     Body parts n/a:  Front perineal area, Buttocks, Right lower leg, Left lower leg, Left upper leg, Left arm, Right arm   Bathing assist Assist Level: Maximal Assistance - Patient 24 - 49%     Upper Body Dressing/Undressing Upper body dressing   What is the patient wearing?: Bra, Button up shirt    Upper body assist Assist Level: Total Assistance - Patient < 25%    Lower Body Dressing/Undressing Lower body dressing      What is the patient wearing?: Incontinence brief, Pants     Lower body assist Assist for lower body dressing: Total Assistance - Patient < 25%     Toileting Toileting Toileting Activity did not occur Landscape architect and hygiene only): N/A (no void or bm)  Toileting assist Assist for toileting: Maximal Assistance - Patient 25 - 49%     Transfers Chair/bed transfer  Transfers assist     Chair/bed transfer assist level: Total Assistance - Patient < 25%     Locomotion Ambulation   Ambulation assist   Ambulation activity did not occur: Safety/medical concerns          Walk 10 feet activity   Assist  Walk 10 feet activity did not occur: Safety/medical concerns  Walk 50 feet activity   Assist Walk 50 feet with 2 turns activity did not occur: Safety/medical concerns         Walk 150 feet activity   Assist Walk 150 feet activity did not occur: Safety/medical concerns         Walk 10 feet on uneven surface  activity   Assist Walk 10 feet on uneven surfaces activity did not occur: Safety/medical concerns         Wheelchair     Assist Is the patient using a wheelchair?: Yes Type of Wheelchair: Manual Wheelchair activity did not occur: Safety/medical concerns  Wheelchair assist level: Total Assistance - Patient < 25% Max wheelchair distance: 20 ft    Wheelchair 50 feet with 2 turns activity    Assist    Wheelchair 50 feet with 2 turns activity did not occur: Safety/medical concerns       Wheelchair 150 feet  activity     Assist  Wheelchair 150 feet activity did not occur: Safety/medical concerns       Blood pressure 136/61, pulse 75, temperature 98.3 F (36.8 C), resp. rate 16, weight 56.7 kg, SpO2 96 %.  Medical Problem List and Plan: 1. Functional deficits secondary to right MCA infarction, A2 and P2 occlusion status post TNK/status post thrombectomy             -patient may  shower             -ELOS/Goals: 14-16d Min/Mod A goals for ADLs and Mobility  Team conference today please see physician documentation under team conference tab, met with team  to discuss problems,progress, and goals. Formulized individual treatment plan based on medical history, underlying problem and comorbidities.  2.  Antithrombotics: -DVT/anticoagulation:  SCD             -antiplatelet therapy: Eliquis 2.5 mg BID 3. Pain Management: Lidoderm patch.  Tramadol as needed 4. Mood/Behavior/Sleep: Provide emotional support             -antipsychotic agents: N/A 5. Neuropsych/cognition: This patient IS capable of making decisions on her own behalf. 6. Skin/Wound Care: Routine skin checks 7. Fluids/Electrolytes/Nutrition: Routine in and outs with follow-up chemistries 8.  Atrial fibrillation/pacemaker.  Lopressor 12.5 mg every 12 hours.  Cardiac rate controlled  Eliquis 2.46m BID 9.  Diabetes mellitus.  Hemoglobin A1c 6.9.  SSI. CBG (last 3)  Recent Labs    05/06/22 1638 05/06/22 2107 05/07/22 0634  GLUCAP 146* 188* 133*   Controlled , only received 3 U SSI   10.  Dysphagia.  Dysphagia #3 thin liquids.  Follow-up speech therapy 11.  GERD.  Protonix  12.  Hypo K+ improved after po supplementation cont to monitor     Latest Ref Rng & Units 05/06/2022    5:41 AM 05/05/2022    8:16 AM 05/04/2022    3:52 AM  BMP  Glucose 70 - 99 mg/dL 140  131  135   BUN 8 - 23 mg/dL _0 Creatinine 0.44 - 1.00 mg/dL 0.88  0.84  0.84   Sodium 135 - 145 mmol/L 137  137  140   Potassium 3.5 - 5.1 mmol/L 3.5  2.9   3.5   Chloride 98 - 111 mmol/L 96  97  101   CO2 22 - 32 mmol/L _1 Calcium 8.9 - 10.3 mg/dL 8.7  8.3  8.4     LOS: 2 days A FACE TO  FACE EVALUATION WAS PERFORMED  Charlett Blake 05/07/2022, 9:15 AM

## 2022-05-07 NOTE — Progress Notes (Signed)
Patient continues to have a poor appetite. Patient states everything tastes terrible. Encouraged fluids.

## 2022-05-07 NOTE — Progress Notes (Signed)
Patient ID: Kara [name], female   DOB: July 04, 1922, 88 y.o.   MRN: [id number]  Team Conference Report to Patient/Family  Team Conference discussion was reviewed with the patient and caregiver, including goals, any changes in plan of care and target discharge date.  Patient and caregiver express understanding and are in agreement.  The patient has a target discharge date of 05/22/22.  SW met with daughter in room and provided updates. Based on evaluation patient potential to discharge to ALF or NS based on progress. Sw will update daughter next week with updates.  Dyanne Iha 05/07/2022, 1:54 PM

## 2022-05-07 NOTE — Progress Notes (Signed)
Speech Language Pathology Daily Session Note  Patient Details  Name: Kara [name] MRN: [id number] Date of Birth: [birth year]  Today's Date: 05/07/2022 SLP Individual Time: 0950-1020 SLP Individual Time Calculation (min): 30 min and Today's Date: 05/07/2022 SLP Missed Time: 15 Minutes Missed Time Reason: Toileting;Nursing care  Short Term Goals: Week 1: SLP Short Term Goal 1 (Week 1): Pt will utilize speech intelligibility strategies (A BOSS) with Supervision A cues for 90% intelligibility at sentence level SLP Short Term Goal 2 (Week 1): Pt will increase orientation to place, time and reason for hospitalization with Supervision A verbal and visual cues SLP Short Term Goal 3 (Week 1): Pt will increase recall of simple, functional information with min A verbal cues for 80% accuracy SLP Short Term Goal 4 (Week 1): With mod A multimodal cues, pt will scan to midline and/or left of midline during functional tasks SLP Short Term Goal 5 (Week 1): Pt will comple simple problem solving tasks with min A verbal cues SLP Short Term Goal 6 (Week 1): Pt will ID 3 physical and 2 cognitive changes s/p CVA to increase awareness into current deficits  Skilled Therapeutic Interventions: Pt seen for skilled SLP services to address cognitive goals. Of note, 15 minutes of session missed due to toileting and nursing cares.   Structured cognitive tasks addressed calendar orientation task and visual attention to L side. Pt required max verbal and visual cues (following SLP's finger to track to L side) to determine calendar date error and to correct the calendar date to the current date. Max cues needed to state the complete date including DOW, month, and date. Pt able to answer simple orientation questions regarding upcoming holidays with 100% accuracy. She required mod-max cues to accurately answer questions pertaining to length of time (I.e. "How many days from today until Christmas?"). Pt required max encouragement  to maintain alertness and stay engaged throughout this session.   During session, pt required use of bedpan. She required max cues to sequence steps and follow directions for bed pan placement.   Pt left in bed with NT present. Bed alarm set. Family at bedside. All needs within reach. Continue SLP PoC.      Pain Pt reported intermittent pain with repositioning.   Therapy/Group: Individual Therapy  Wyn Forster 05/07/2022, 11:30 AM

## 2022-05-08 DIAGNOSIS — E440 Moderate protein-calorie malnutrition: Secondary | ICD-10-CM

## 2022-05-08 DIAGNOSIS — M79604 Pain in right leg: Secondary | ICD-10-CM

## 2022-05-08 DIAGNOSIS — B370 Candidal stomatitis: Secondary | ICD-10-CM

## 2022-05-08 DIAGNOSIS — E876 Hypokalemia: Secondary | ICD-10-CM

## 2022-05-08 DIAGNOSIS — E1169 Type 2 diabetes mellitus with other specified complication: Secondary | ICD-10-CM

## 2022-05-08 DIAGNOSIS — M79605 Pain in left leg: Secondary | ICD-10-CM

## 2022-05-08 LAB — CBC WITH DIFFERENTIAL/PLATELET
Abs Immature Granulocytes: 0.02 10*3/uL (ref 0.00–0.07)
Basophils Absolute: 0 10*3/uL (ref 0.0–0.1)
Basophils Relative: 0 %
Eosinophils Absolute: 0.1 10*3/uL (ref 0.0–0.5)
Eosinophils Relative: 1 %
HCT: 38.6 % (ref 36.0–46.0)
Hemoglobin: 13.3 g/dL (ref 12.0–15.0)
Immature Granulocytes: 0 %
Lymphocytes Relative: 23 %
Lymphs Abs: 2 10*3/uL (ref 0.7–4.0)
MCH: 32.7 pg (ref 26.0–34.0)
MCHC: 34.5 g/dL (ref 30.0–36.0)
MCV: 94.8 fL (ref 80.0–100.0)
Monocytes Absolute: 0.8 10*3/uL (ref 0.1–1.0)
Monocytes Relative: 10 %
Neutro Abs: 5.6 10*3/uL (ref 1.7–7.7)
Neutrophils Relative %: 66 %
Platelets: 198 10*3/uL (ref 150–400)
RBC: 4.07 MIL/uL (ref 3.87–5.11)
RDW: 12.7 % (ref 11.5–15.5)
WBC: 8.5 10*3/uL (ref 4.0–10.5)
nRBC: 0 % (ref 0.0–0.2)

## 2022-05-08 LAB — GLUCOSE, CAPILLARY
Glucose-Capillary: 142 mg/dL — ABNORMAL HIGH (ref 70–99)
Glucose-Capillary: 151 mg/dL — ABNORMAL HIGH (ref 70–99)
Glucose-Capillary: 128 mg/dL — ABNORMAL HIGH (ref 70–99)
Glucose-Capillary: 142 mg/dL — ABNORMAL HIGH (ref 70–99)

## 2022-05-08 MED ORDER — MAGIC MOUTHWASH
10.0000 mL | Freq: Four times a day (QID) | ORAL | Status: DC
  Administered 2022-05-08 – 2022-05-12 (×8): 10 mL via ORAL
  Filled 2022-05-08 (×18): qty 10

## 2022-05-08 NOTE — IPOC Note (Signed)
Overall Plan of Care [facility]) Patient Details Name: Kara [name] MRN: [id number] DOB: 1923-03-03  Admitting Diagnosis: Right middle cerebral artery stroke [facility])  Hospital Problems: Principal Problem:   Right middle cerebral artery stroke [facility])     Functional Problem List: Nursing Bowel, Pain, Endurance, Medication Management, Safety  PT Balance, Edema, Endurance, Motor, Nutrition, Pain, Perception, Safety, Sensory, Skin Integrity, Other (comment)  OT Balance, Motor, Sensory, Behavior, Skin Integrity, Cognition, Pain, Vision, Edema, Perception, Endurance, Safety  SLP Cognition, Motor, Safety  TR         Basic ADL's: OT Eating, Grooming, Bathing, Dressing, Toileting     Advanced  ADL's: OT       Transfers: PT Bed Mobility, Bed to Chair, Car, Manufacturing systems engineer, Metallurgist: PT Ambulation, Emergency planning/management officer, Stairs     Additional Impairments: OT Fuctional Use of Upper Extremity  SLP Swallowing, Communication, Social Cognition expression Problem Solving, Memory, Awareness  TR      Anticipated Outcomes Item Anticipated Outcome  Self Feeding Supervision  Swallowing  Supervision   Basic self-care  Min assist  Toileting  mod assist   Bathroom Transfers mod assist  Bowel/Bladder  manage bowel w mod I assist  Transfers  CGA  Locomotion  MinA  Communication  Supervision  Cognition  Min A  Pain  < 4 with prns  Safety/Judgment  manage w cues   Therapy Plan: PT Intensity: Minimum of 1-2 x/day ,45 to 90 minutes PT Frequency: 5 out of 7 days PT Duration Estimated Length of Stay: 16-20 days OT Intensity: Minimum of 1-2 x/day, 45 to 90 minutes OT Frequency: 5 out of 7 days OT Duration/Estimated Length of Stay: 21-24 days SLP Intensity: Minumum of 1-2 x/day, 30 to 90 minutes SLP Frequency: 3 to 5 out of 7 days SLP Duration/Estimated Length of Stay: 14-16 days   Team Interventions: Nursing Interventions Bowel Management, Disease  Management/Prevention, Patient/Family Education, Discharge Planning, Pain Management, Medication Management  PT interventions Ambulation/gait training, Community reintegration, DME/adaptive equipment instruction, Neuromuscular re-education, Psychosocial support, Stair training, UE/LE Strength taining/ROM, Wheelchair propulsion/positioning, UE/LE Coordination activities, Therapeutic Activities, Skin care/wound management, Pain management, Functional electrical stimulation, Discharge planning, Training and development officer, Cognitive remediation/compensation, Disease management/prevention, Functional mobility training, Patient/family education, Splinting/orthotics, Therapeutic Exercise, Visual/perceptual remediation/compensation  OT Interventions Balance/vestibular training, Disease mangement/prevention, Neuromuscular re-education, Patient/family education, Self Care/advanced ADL retraining, Splinting/orthotics, Therapeutic Exercise, UE/LE Coordination activities, Cognitive remediation/compensation, Discharge planning, DME/adaptive equipment instruction, Functional mobility training, Pain management, Skin care/wound managment, Therapeutic Activities, UE/LE Strength taining/ROM, Visual/perceptual remediation/compensation  SLP Interventions Cognitive remediation/compensation, Dysphagia/aspiration precaution training, Internal/external aids, Therapeutic Activities, Therapeutic Exercise, Patient/family education, Functional tasks, Cueing hierarchy  TR Interventions    SW/CM Interventions Discharge Planning, Psychosocial Support, Patient/Family Education, Disease Management/Prevention   Barriers to Discharge MD  Medical stability and Home enviroment access/loayout  Nursing Lack of/limited family support ALF/elevator to 5th floor; caregiver prn  PT Decreased caregiver support, Neurogenic Bowel & Bladder, Lack of/limited family support, Insurance for SNF coverage, Weight, Nutrition means    OT      SLP Decreased  caregiver support    SW Decreased caregiver support, Lack of/limited family support     Team Discharge Planning: Destination: PT-Home ,OT- Home , SLP-Home (ILF) Projected Follow-up: PT-Home health PT, 24 hour supervision/assistance, OT-  Home health OT, Outpatient OT, SLP-Outpatient SLP, 24 hour supervision/assistance Projected Equipment Needs: PT-To be determined, OT- To be determined, SLP-To be determined Equipment Details: PT- , OT-Patient lives in independent living complex - has option  to increase personal care assistant Patient/family involved in discharge planning: PT- Patient, Family member/caregiver,  OT-Patient, Family member/caregiver, SLP-Patient, Family member/caregiver  MD ELOS: 14-16 days Medical Rehab Prognosis:  Good Assessment: The patient has been admitted for CIR therapies with the diagnosis of right MCA infarction, A2 and P2 occlusion status post TNK/status post thrombectomy . The team will be addressing functional mobility, strength, stamina, balance, safety, adaptive techniques and equipment, self-care, bowel and bladder mgt, patient and caregiver education. Goals have been set at Min/Mod A  for ADLs and Mobility . Anticipated discharge destination is home.        See Team Conference Notes for weekly updates to the plan of care

## 2022-05-08 NOTE — Progress Notes (Addendum)
PROGRESS NOTE   Subjective/Complaints: Continues to have poor PO intake-discussed with daughter.  Pt reports she doesn't like the food very much.  Reports chronic soreness throughout arms and legs. Daughter reports she had a bad reaction to nerve pain medication in past but doesn't remember name of medication.   ROS- neg for abd pain, occ nausea , no CP or SOB Objective:   No results found. Recent Labs    05/06/22 0541 05/08/22 0600  WBC 8.0 8.5  HGB 12.8 13.3  HCT 37.3 38.6  PLT 162 198    Recent Labs    05/06/22 0541  NA 137  K 3.5  CL 96*  CO2 30  GLUCOSE 140*  BUN 14  CREATININE 0.88  CALCIUM 8.7*     Intake/Output Summary (Last 24 hours) at 05/08/2022 1402 Last data filed at 05/08/2022 1200 Gross per 24 hour  Intake 260 ml  Output 100 ml  Net 160 ml         Physical Exam: Vital Signs Blood pressure (!) 161/90, pulse 91, temperature 98.3 F (36.8 C), resp. rate 16, weight 56.7 kg, SpO2 95 %.  General: No acute distress Mood and affect are appropriate Heart: Regular rate and rhythm no rubs murmurs or extra sounds Lungs: Clear to auscultation, breathing unlabored, no rales or wheezes Abdomen: Positive bowel sounds, soft nontender to palpation, nondistended Extremities: edema and ecchymosis LUE  Skin: No evidence of breakdown, no evidence of rash Neurologic: Cranial nerves II through XII intact, motor strength is 5/5 in right and 0/5deltoid, bicep, tricep, grip, hip flexor, knee extensors, ankle dorsiflexor and plantar flexor Sensory exam reduced sensation to LT in LUE and LLE  Musculoskeletal: Full range of motion in all 4 extremities. No joint swelling, mild  tenderness throughout b/l UE and LE   Assessment/Plan: 1. Functional deficits which require 3+ hours per day of interdisciplinary therapy in a comprehensive inpatient rehab setting. Physiatrist is providing close team supervision and 24  hour management of active medical problems listed below. Physiatrist and rehab team continue to assess barriers to discharge/monitor patient progress toward functional and medical goals  Care Tool:  Bathing    Body parts bathed by patient: Chest, Abdomen, Right upper leg, Face     Body parts n/a: Front perineal area, Buttocks, Right lower leg, Left lower leg, Left upper leg, Left arm, Right arm   Bathing assist Assist Level: Maximal Assistance - Patient 24 - 49%     Upper Body Dressing/Undressing Upper body dressing   What is the patient wearing?: Bra, Button up shirt    Upper body assist Assist Level: Total Assistance - Patient < 25%    Lower Body Dressing/Undressing Lower body dressing      What is the patient wearing?: Incontinence brief, Pants     Lower body assist Assist for lower body dressing: Total Assistance - Patient < 25%     Toileting Toileting Toileting Activity did not occur Landscape architect and hygiene only): N/A (no void or bm)  Toileting assist Assist for toileting: 2 Helpers     Transfers Chair/bed transfer  Transfers assist     Chair/bed transfer assist level: Total Assistance - Patient <  25%     Locomotion Ambulation   Ambulation assist   Ambulation activity did not occur: Safety/medical concerns          Walk 10 feet activity   Assist  Walk 10 feet activity did not occur: Safety/medical concerns        Walk 50 feet activity   Assist Walk 50 feet with 2 turns activity did not occur: Safety/medical concerns         Walk 150 feet activity   Assist Walk 150 feet activity did not occur: Safety/medical concerns         Walk 10 feet on uneven surface  activity   Assist Walk 10 feet on uneven surfaces activity did not occur: Safety/medical concerns         Wheelchair     Assist Is the patient using a wheelchair?: Yes Type of Wheelchair: Manual Wheelchair activity did not occur: Safety/medical  concerns  Wheelchair assist level: Total Assistance - Patient < 25% Max wheelchair distance: 20 ft    Wheelchair 50 feet with 2 turns activity    Assist    Wheelchair 50 feet with 2 turns activity did not occur: Safety/medical concerns       Wheelchair 150 feet activity     Assist  Wheelchair 150 feet activity did not occur: Safety/medical concerns       Blood pressure (!) 161/90, pulse 91, temperature 98.3 F (36.8 C), resp. rate 16, weight 56.7 kg, SpO2 95 %.  Medical Problem List and Plan: 1. Functional deficits secondary to right MCA infarction, A2 and P2 occlusion status post TNK/status post thrombectomy             -patient may  shower             -ELOS/Goals: 14-16d Min/Mod A goals for ADLs and Mobility   -Continue CIR PT/OT/SLP  2.  Antithrombotics: -DVT/anticoagulation:  SCD             -antiplatelet therapy: Eliquis 2.5 mg BID 3. Pain Management: Lidoderm patch.  Tramadol as needed  -Continue tramadol PRN, daughter reports poor response to a nerve pain medication but does not remember name of this medication 4. Mood/Behavior/Sleep: Provide emotional support             -antipsychotic agents: N/A 5. Neuropsych/cognition: This patient IS capable of making decisions on her own behalf. 6. Skin/Wound Care: Routine skin checks 7. Fluids/Electrolytes/Nutrition: Routine in and outs with follow-up chemistries 8.  Atrial fibrillation/pacemaker.  Lopressor 12.5 mg every 12 hours.  Cardiac rate controlled  Eliquis 2.'5mg'$  BID 9.  Diabetes mellitus.  Hemoglobin A1c 6.9.  SSI. CBG (last 3)  Recent Labs    05/07/22 2105 05/08/22 0511 05/08/22 1129  GLUCAP 139* 142* 151*   Well controlled, continue to monitor   10.  Dysphagia.  Dysphagia #3 thin liquids.  Follow-up speech therapy 11.  GERD.  Protonix  12.  Hypo K+ improved after po supplementation cont to monitor  -BMP tomorrow ordered    Latest Ref Rng & Units 05/06/2022    5:41 AM 05/05/2022    8:16 AM  05/04/2022    3:52 AM  BMP  Glucose 70 - 99 mg/dL 140  131  135   BUN 8 - 23 mg/dL '14  13  17   '$ Creatinine 0.44 - 1.00 mg/dL 0.88  0.84  0.84   Sodium 135 - 145 mmol/L 137  137  140   Potassium 3.5 - 5.1 mmol/L 3.5  2.9  3.5   Chloride 98 - 111 mmol/L 96  97  101   CO2 22 - 32 mmol/L '30  29  27   '$ Calcium 8.9 - 10.3 mg/dL 8.7  8.3  8.4   13.  Thrush  -s/p diflucan 12/20 14. Moderate malnutrition  -Consult dietician, consider cortrak if doesn't improve   LOS: 3 days A FACE TO FACE EVALUATION WAS PERFORMED  Jennye Boroughs 05/08/2022, 2:02 PM

## 2022-05-08 NOTE — Progress Notes (Signed)
Speech Language Pathology Daily Session Note  Patient Details  Name: Kara [name] MRN: [id number] Date of Birth: Jan 18, 1923  Today's Date: 05/08/2022 SLP Individual Time: 1443-1540 SLP Individual Time Calculation (min): 42 min  Short Term Goals: Week 1: SLP Short Term Goal 1 (Week 1): Pt will utilize speech intelligibility strategies (A BOSS) with Supervision A cues for 90% intelligibility at sentence level SLP Short Term Goal 2 (Week 1): Pt will increase orientation to place, time and reason for hospitalization with Supervision A verbal and visual cues SLP Short Term Goal 3 (Week 1): Pt will increase recall of simple, functional information with min A verbal cues for 80% accuracy SLP Short Term Goal 4 (Week 1): With mod A multimodal cues, pt will scan to midline and/or left of midline during functional tasks SLP Short Term Goal 5 (Week 1): Pt will comple simple problem solving tasks with min A verbal cues SLP Short Term Goal 6 (Week 1): Pt will ID 3 physical and 2 cognitive changes s/p CVA to increase awareness into current deficits  Skilled Therapeutic Interventions: Skilled ST treatment focused on cognitive goals. Pt had just completed morning meal upon arrival with daughter at bedside. Daughter reported no noticeable evidence of pocketing or overt swallowing difficulty nor s/sx of aspiration during meal. SLP reinforced swallowing precautions and strategies including lingual sweep. Pt demonstrated understanding by demonstrating lingual sweep. Pt consumed sips of thin liquid via straw with no overt s/sx of aspiration. Cont current diet as tolerated.   Pt was oriented to situation independent of cues. Max cues needed for dow, month, and date. Pt identifies 1 physical impairment ("my left side is weak") and 1 speech change s/p CVA independent of cues. SLP facilitated a mildly complex problem solving and verbal reasoning task with min-to-mod A cues for working memory and sustained attention. SLP  utilized visual reinforcements to aid memory and pt required mod A multimodal cues to scan left of midline.   Patient was left in bed with alarm activated and immediate needs within reach at end of session. Continue per current plan of care.      Pain  No acute pain although pt endorsed general "aches and pains"  Therapy/Group: Individual Therapy  Patty Sermons 05/08/2022, 12:21 PM

## 2022-05-08 NOTE — Progress Notes (Signed)
Pt complaining of left arm pain. Per nurse Kennyth Lose, therapy addressed the issue that pt was very lethargic during therapy after given tramadol. Prn tylenol was given. No further concerns at this time.

## 2022-05-08 NOTE — Progress Notes (Signed)
Physical Therapy Session Note  Patient Details  Name: Kara [name] MRN: [id number] Date of Birth: [birth year]  Today's Date: 05/08/2022 PT Individual Time:  -      Short Term Goals: Week 1:  PT Short Term Goal 1 (Week 1): Pt will perform bed mobility with consistent ModA and improved initiation. PT Short Term Goal 2 (Week 1): Pt will perform sit<>stand with consistent ModA PT Short Term Goal 3 (Week 1): Pt will improve sitting balance to find midline and hold sitting balance for at least 24mn with overall Min/ ModA. PT Short Term Goal 4 (Week 1): Pt will tolerate standing with MaxA for at least 30sec.  Skilled Therapeutic Interventions/Progress Updates:    Pt seated in w/c on arrival and agreeable to therapy. Pt with pain intermittently when therapist touches LLE, such as while donning shoes. Donned shoes tot A for time. Pt transported to therapy gym for time management and energy conservation. Pt participated in standing practice with tot A in // bars, but noted pushing to L side. Pt participated in R lateral leans using R // bar as external cues. Pt performed anterior and posterior leans with cues to keep R shoulder on bar to maintain midline orientation, as well as reaches for R weight shift. Pt then able to stand x 2 with max A and cueing for upright posture. Cued pt to look to L in mirror, but pt unable to dissociate head turn from full body lean at this time. Pt transported to day room for holiday party. Utilized positioning and therapeutic use of self for L attention. Pt interacted with therapy dog and "santa", including accepting gift. Pt then participated in reindeer toss, with cues to toss to L side, and then positioning therapist to hand rings on L side for improved attention. Pt returned to room and to bed with max A squat pivot to R side. Mod A sit>supine for LLE management. Pt then performed bridging 4 x 10 for strength and improved hip extension in standing. Pt positined with LUE  supported on pillow for edema management. Pt was left with all needs in reach and alarm active.   Therapy Documentation Precautions:  Precautions Precautions: Fall Precaution Comments: L hemipareisis with L neglect, intermittent pusher Restrictions Weight Bearing Restrictions: No General:       Therapy/Group: Individual Therapy  OMickel Fuchs12/21/2023, 2:01 PM

## 2022-05-08 NOTE — Progress Notes (Signed)
Occupational Therapy Session Note  Patient Details  Name: Kara [name] MRN: [id number] Date of Birth: Jul 14, 1922  Today's Date: 05/08/2022 OT Individual Time: 1103-1200 session 1  OT Individual Time Calculation (min): 57 min  Session 2: 1450-1530    Short Term Goals: Week 1:  OT Short Term Goal 1 (Week 1): Patient will sit at edge of bed with min assist x 30 seconds in preparation for level surface trasnfer OT Short Term Goal 2 (Week 1): Patient will visually locate wheelchair placed to left of midline with mod cueing prior to transfer OT Short Term Goal 3 (Week 1): Patient will trasnfer from bed to wheelchair with mod assist in preparation for completing bathing/ hygiene at sink. OT Short Term Goal 4 (Week 1): Patient will physically locate left arm on body and position in preparation for rolling or trasnfer with mod cueing and min assist OT Short Term Goal 5 (Week 1): Patient will attend to self in mirror (midline)with min prompting x 10-15 seconds when completing am ADL  Skilled Therapeutic Interventions/Progress Updates:  Session 1: Pt greeted supine in bed, pt agreeable to OT intervention. Pt reports need to void bladder, utilized bed pan with pt rolling R<>L with MOD A. Total A for placement, pt heavily impacted by pain when placing bed pan. Pt was able to void bladder with NT present. Pt donned pants from supine with MAXA. Pt completed supine>sit MAXA  to L side. Pt completed squat pivot to w/c to R side with MAX A +2. Once in chair, able to don [facility] shirt and bra with MAX A. Pt completed seated grooming tasks at sink with supervision with pt using RUE to brush her hair.   Ended session with pt seated in w/c with all needs within reach and safety belt alarm activated.                   Session 2: Pt greeted supine in bed, pt agreeable to OT intervention. Pt reports need to void b/b. Utilized bed pan d/t urgency, MOD A to roll R<>L to place bed pan. Pt with + urine void, NT aware ( urine  also continues to be cloudy- nurse aware). Pt needed MAX A for 3/3 toileting tasks. Pt completed supine>sit with MAX A. Pt sat EOB with overall MIN A as pt continues to present with posterior and L lateral lean. Worked on sitting balance from EOB with pt sitting best with RUE supported on bed rail, worked on dynamic reaching with LUE with pt presenting with L inattention needing MAX cues to fully turn head to L side to notice stimuli. LUE is greatly improving in AROM with pt able to reach to stimulus with MOD support at L elbow to decrease gravity.   Pt completed sit>stands from EOB for LLE NMR with pt able to stand x2 with MODA. Therapist blocked LLE and supported LUE with pt needing manual facilitation at hips to shift hips anteriorly and to fully elevate trunk into standing.   Ended session with pt supine in bed with all needs within reach and bed alarm activated.                    Therapy Documentation Precautions:  Precautions Precautions: Fall Precaution Comments: L hemipareisis with L neglect, intermittent pusher Restrictions Weight Bearing Restrictions: No  Pain: During both sessions pt reports pain in back and all over skin often stating "you're pinching me" provided redirection and various different hand positions to assist with  pain. Rest breaks provided as needed for back pain.     Therapy/Group: Individual Therapy  Precious Haws 05/08/2022, 12:25 PM

## 2022-05-09 LAB — BASIC METABOLIC PANEL
Anion gap: 12 (ref 5–15)
BUN: 11 mg/dL (ref 8–23)
CO2: 31 mmol/L (ref 22–32)
Calcium: 9.1 mg/dL (ref 8.9–10.3)
Chloride: 95 mmol/L — ABNORMAL LOW (ref 98–111)
Creatinine, Ser: 0.75 mg/dL (ref 0.44–1.00)
GFR, Estimated: >60 mL/min (ref >60)
Glucose, Bld: 123 mg/dL — ABNORMAL HIGH (ref 70–99)
Potassium: 2.9 mmol/L — ABNORMAL LOW (ref 3.5–5.1)
Sodium: 138 mmol/L (ref 135–145)

## 2022-05-09 LAB — GLUCOSE, CAPILLARY
Glucose-Capillary: 131 mg/dL — ABNORMAL HIGH (ref 70–99)
Glucose-Capillary: 145 mg/dL — ABNORMAL HIGH (ref 70–99)
Glucose-Capillary: 128 mg/dL — ABNORMAL HIGH (ref 70–99)
Glucose-Capillary: 199 mg/dL — ABNORMAL HIGH (ref 70–99)

## 2022-05-09 MED ORDER — ENSURE ENLIVE PO LIQD
237.0000 mL | Freq: Two times a day (BID) | ORAL | Status: DC
  Administered 2022-05-10 – 2022-05-21 (×12): 237 mL via ORAL

## 2022-05-09 MED ORDER — ADULT MULTIVITAMIN W/MINERALS CH
1.0000 | ORAL_TABLET | Freq: Every day | ORAL | Status: DC
  Administered 2022-05-09 – 2022-05-22 (×14): 1 via ORAL
  Filled 2022-05-09 (×14): qty 1

## 2022-05-09 MED ORDER — POTASSIUM CHLORIDE CRYS ER 20 MEQ PO TBCR
40.0000 meq | EXTENDED_RELEASE_TABLET | ORAL | Status: AC
  Administered 2022-05-09 (×2): 40 meq via ORAL
  Filled 2022-05-09 (×2): qty 2

## 2022-05-09 NOTE — Progress Notes (Signed)
Occupational Therapy Session Note  Patient Details  Name: Kara [name] MRN: [id number] Date of Birth: [birth year]  Today's Date: 05/09/2022 OT Individual Time: 7076-1518 OT Individual Time Calculation (min): 60 min    Short Term Goals: Week 1:  OT Short Term Goal 1 (Week 1): Patient will sit at edge of bed with min assist x 30 seconds in preparation for level surface trasnfer OT Short Term Goal 2 (Week 1): Patient will visually locate wheelchair placed to left of midline with mod cueing prior to transfer OT Short Term Goal 3 (Week 1): Patient will trasnfer from bed to wheelchair with mod assist in preparation for completing bathing/ hygiene at sink. OT Short Term Goal 4 (Week 1): Patient will physically locate left arm on body and position in preparation for rolling or trasnfer with mod cueing and min assist OT Short Term Goal 5 (Week 1): Patient will attend to self in mirror (midline)with min prompting x 10-15 seconds when completing am ADL  Skilled Therapeutic Interventions/Progress Updates:    Pt received in bed. Daughter present and requesting to have pt's hair washed.  Retrieved shampoo tray.  Noticed pt's brief was soaking wet.  Had pt work on rolling in bed and bridging with max A,  cleansed her bottom and had pt self cleanse front perineal area. Donned pants on patient, with pt helping by bridging. Pt moved to sidelying then sitting with mod A.  Max squat pivot to R to wc.  Pt then stated she had to use the toilet.  Because she was already in the chair, moved pt to toilet. Max squat pivot to toilet but then pt unable to static sit on toilet.  Pt leaning at 45 degree angle almost hitting head on the wall.  Total A to hold balance, but pt c/o of pain from physical support. Pt very sensitive to touch and c/o pain with any level of touch.  Had to move pt  back to wc as toilet not safe at this time.  Pt no longer c/o needing to toilet.   With help from daughter , used shampoo tray at sink to  wash pt's hair.  Max A to change clothing.  Pt leaning in WC to L, tried lap tray for support but pt still leaning over tray and unable to hold balance. Pt moved back to bed with max A.  Changed pants a second time as new pants got wet from washing her hair.   Pt is now moving L arm more actively and can open and close hand and lift arm up about 40% of AROM.   Pt resting in bed with all needs met. Bed alarm set.   Therapy Documentation Precautions:  Precautions Precautions: Fall Precaution Comments: L hemipareisis with L neglect, intermittent pusher Restrictions Weight Bearing Restrictions: No  Pain: no pain at rest, but c/o intense pain with light touch during ADLs.     Therapy/Group: Individual Therapy  Tahtiana Rozier 05/09/2022, 8:27 AM

## 2022-05-09 NOTE — Progress Notes (Signed)
PROGRESS NOTE   Subjective/Complaints: Continues to have generalized soreness. Reviewed with pharmacy, daughter reports she because nauseated from nerve pain medication while at [facility] few years ago for hip surgery. Reviewed with pharmacy does not appear she had gabapentin at that time or any other time documented.  Called daughter back but she reports she realized this medicine was given at outside SNF.   ROS- neg for abd pain, occ nausea , no CP or SOB Objective:   No results found. Recent Labs    05/08/22 0600  WBC 8.5  HGB 13.3  HCT 38.6  PLT 198    Recent Labs    05/09/22 0534  NA 138  K 2.9*  CL 95*  CO2 31  GLUCOSE 123*  BUN 11  CREATININE 0.75  CALCIUM 9.1     Intake/Output Summary (Last 24 hours) at 05/09/2022 1429 Last data filed at 05/09/2022 1307 Gross per 24 hour  Intake 277 ml  Output 300 ml  Net -23 ml         Physical Exam: Vital Signs Blood pressure 122/67, pulse 71, temperature 98.6 F (37 C), resp. rate 20, weight 56.7 kg, SpO2 93 %.  General: No acute distress Mood and affect are appropriate Heart: Regular rate and rhythm no rubs murmurs or extra sounds Lungs: Clear to auscultation, breathing unlabored, no rales or wheezes Abdomen: Positive bowel sounds, soft nontender to palpation, nondistended Extremities: edema and ecchymosis LUE  Skin: warm and dry Neurologic: Cranial nerves II through XII intact, motor strength is 5/5 in right and 0/5deltoid, bicep, tricep, grip, hip flexor, knee extensors, ankle dorsiflexor and plantar flexor Sensory exam reduced sensation to LT in LUE and LLE  Musculoskeletal: Full range of motion in all 4 extremities. No joint swelling, mild  tenderness throughout b/l UE and LE   Assessment/Plan: 1. Functional deficits which require 3+ hours per day of interdisciplinary therapy in a comprehensive inpatient rehab setting. Physiatrist is providing close  team supervision and 24 hour management of active medical problems listed below. Physiatrist and rehab team continue to assess barriers to discharge/monitor patient progress toward functional and medical goals  Care Tool:  Bathing    Body parts bathed by patient: Chest, Abdomen, Right upper leg, Face     Body parts n/a: Front perineal area, Buttocks, Right lower leg, Left lower leg, Left upper leg, Left arm, Right arm   Bathing assist Assist Level: Maximal Assistance - Patient 24 - 49%     Upper Body Dressing/Undressing Upper body dressing   What is the patient wearing?: Bra, Button up shirt    Upper body assist Assist Level: Total Assistance - Patient < 25%    Lower Body Dressing/Undressing Lower body dressing      What is the patient wearing?: Incontinence brief, Pants     Lower body assist Assist for lower body dressing: Total Assistance - Patient < 25%     Toileting Toileting Toileting Activity did not occur (Clothing management and hygiene only): N/A (no void or bm)  Toileting assist Assist for toileting: Total Assistance - Patient < 25%     Transfers Chair/bed transfer  Transfers assist     Chair/bed transfer  assist level: Total Assistance - Patient < 25%     Locomotion Ambulation   Ambulation assist   Ambulation activity did not occur: Safety/medical concerns          Walk 10 feet activity   Assist  Walk 10 feet activity did not occur: Safety/medical concerns        Walk 50 feet activity   Assist Walk 50 feet with 2 turns activity did not occur: Safety/medical concerns         Walk 150 feet activity   Assist Walk 150 feet activity did not occur: Safety/medical concerns         Walk 10 feet on uneven surface  activity   Assist Walk 10 feet on uneven surfaces activity did not occur: Safety/medical concerns         Wheelchair     Assist Is the patient using a wheelchair?: Yes Type of Wheelchair:  Manual Wheelchair activity did not occur: Safety/medical concerns         Wheelchair 50 feet with 2 turns activity    Assist    Wheelchair 50 feet with 2 turns activity did not occur: Safety/medical concerns       Wheelchair 150 feet activity     Assist  Wheelchair 150 feet activity did not occur: Safety/medical concerns       Blood pressure 122/67, pulse 71, temperature 98.6 F (37 C), resp. rate 20, weight 56.7 kg, SpO2 93 %.  Medical Problem List and Plan: 1. Functional deficits secondary to right MCA infarction, A2 and P2 occlusion status post TNK/status post thrombectomy             -patient may  shower             -ELOS/Goals: 14-16d Min/Mod A goals for ADLs and Mobility   -Continue CIR PT/OT/SLP  2.  Antithrombotics: -DVT/anticoagulation:  SCD             -antiplatelet therapy: Eliquis 2.5 mg BID 3. Pain Management: Lidoderm patch.  Tramadol as needed  -Continue tramadol PRN, daughter reports poor response to a nerve pain medication but does not remember name of this medication  -Daughter to ask SNF what medicine caused her to feel very nauseated in past, consider gabapentin 4. Mood/Behavior/Sleep: Provide emotional support             -antipsychotic agents: N/A 5. Neuropsych/cognition: This patient IS capable of making decisions on her own behalf. 6. Skin/Wound Care: Routine skin checks 7. Fluids/Electrolytes/Nutrition: Routine in and outs with follow-up chemistries 8.  Atrial fibrillation/pacemaker.  Lopressor 12.5 mg every 12 hours.  Cardiac rate controlled  Eliquis 2.'5mg'$  BID 9.  Diabetes mellitus.  Hemoglobin A1c 6.9.  SSI. CBG (last 3)  Recent Labs    05/08/22 2051 05/09/22 0610 05/09/22 1140  GLUCAP 142* 131* 145*   12/22 well controlled overall  10.  Dysphagia.  Dysphagia #3 thin liquids.  Follow-up speech therapy 11.  GERD.  Protonix  12.  Hypo K+ improved after po supplementation cont to monitor  -BMP tomorrow ordered    Latest Ref Rng  & Units 05/09/2022    5:34 AM 05/06/2022    5:41 AM 05/05/2022    8:16 AM  BMP  Glucose 70 - 99 mg/dL 123  140  131   BUN 8 - 23 mg/dL '11  14  13   '$ Creatinine 0.44 - 1.00 mg/dL 0.75  0.88  0.84   Sodium 135 - 145 mmol/L 138  137  137   Potassium 3.5 - 5.1 mmol/L 2.9  3.5  2.9   Chloride 98 - 111 mmol/L 95  96  97   CO2 22 - 32 mmol/L '31  30  29   '$ Calcium 8.9 - 10.3 mg/dL 9.1  8.7  8.3    -Discussed with pharmacy, recommended KCL oral 76mq x2 and recheck tomorrow , appreciate assistance  13.  Thrush  -s/p diflucan 12/20  -Improved 14. Moderate malnutrition  -Consult dietician     LOS: 4 days A FACE TO FACE EVALUATION WAS PERFORMED  YJennye Boroughs12/22/2023, 2:29 PM

## 2022-05-09 NOTE — Progress Notes (Signed)
Nutrition Follow-up  DOCUMENTATION CODES:   Not applicable  INTERVENTION:  - Add Ensure Enlive po BID, each supplement provides 350 kcal and 20 grams of protein.  - Add MVI q day.   NUTRITION DIAGNOSIS:   Inadequate oral intake related to poor appetite, lethargy/confusion as evidenced by meal completion < 25%.  GOAL:   Patient will meet greater than or equal to 90% of their needs  MONITOR:   PO intake, Supplement acceptance  REASON FOR ASSESSMENT:   Consult Assessment of nutrition requirement/status  ASSESSMENT:   88 y.o. female admits to CIR related to functional deficits from stroke. PMH reviewed: afib, CAD, cancer, DM, GERD.  Meds reviewed: sliding scale insulin, klor-con. Labs reviewed: K low.   The pt was sleeping at time of assessment. The pt would not wake to multiple attempts at RD calling her name. Intakes vary from 20-100% since admission. The pt is likely not meeting her needs at this time. RD will add Ensure BID for now and continue to monitor PO intakes.   NUTRITION - FOCUSED PHYSICAL EXAM:  Unable to assess, attempt at f/u.   Diet Order:   Diet Order             DIET DYS 3 Room service appropriate? Yes with Assist; Fluid consistency: Thin  Diet effective now                   EDUCATION NEEDS:   Education needs have been addressed  Skin:  Skin Assessment: Skin Integrity Issues: Skin Integrity Issues:: Other (Comment) Other: puncture to right groin  Last BM:  05/07/22  Height:   Ht Readings from Last 1 Encounters:  05/01/22 '5\' 1"'$  (1.549 m)    Weight:   Wt Readings from Last 1 Encounters:  05/05/22 56.7 kg    Ideal Body Weight:     BMI:  Body mass index is 23.62 kg/m.  Estimated Nutritional Needs:   Kcal:  1415-1700 kcals  Protein:  70-85 gm  Fluid:  >/= 1.4 L  Thalia Bloodgood, RD, LDN, CNSC.

## 2022-05-09 NOTE — Progress Notes (Signed)
Physical Therapy Session Note  Patient Details  Name: Kara [name] MRN: [id number] Date of Birth: 10-24-1922  Today's Date: 05/09/2022 PT Individual Time: 4010-2725 PT Individual Time Calculation (min): 43 min   Short Term Goals: Week 1:  PT Short Term Goal 1 (Week 1): Pt will perform bed mobility with consistent ModA and improved initiation. PT Short Term Goal 2 (Week 1): Pt will perform sit<>stand with consistent ModA PT Short Term Goal 3 (Week 1): Pt will improve sitting balance to find midline and hold sitting balance for at least 24mn with overall Min/ ModA. PT Short Term Goal 4 (Week 1): Pt will tolerate standing with MaxA for at least 30sec.  Skilled Therapeutic Interventions/Progress Updates:  Patient supine in bed on entrance to room. Patient alert and agreeable to PT session. Dtr in room but leaves during session.   Patient with no pain complaint at start of session. But does relate that everyone is so rough with her and is always pinching/ grabbing at her BLE. Education provided on incorrect messages sent to brain from nerves in legs d/t stroke. Pt will need to continue to be mindful that sensations to legs are gentle and not actually as bad as they feel.    Therapeutic Activity: Bed Mobility: Pt performed supine --> sit with extensive technique training for log rolling and then push with RUE to reach upright seated position. MaxA for roll, MinA for bringing BLE off EOB. Then [place] to seated position. Is able to scoot R side forward on bed when asked to scoot forward. ModA for L side. At end of session, pt jokingly states, "Let me show you what's easy." Then proceeds to lie down to R side and roll to supine requiring Min/ ModA for BLE to bed surface. With vc is able to move legs to neutral positioning. Mod/ MaxA to move toward [facility]with pt in hooklying and with improved bed positioning. VC/ tc required for technique and sequencing throughout. Transfers: Attempted sit<>stand  with MaxA and pt c/o pain with hold to L ribcage for balance/ support. Squat pivot performed with MaxA and pt requiring visual and verbal cueing for location of target seat with bed<>w/c setup. Provided vc/ tc for technique.  Neuromuscular Re-ed: NMR facilitated during session with focus on seated balance. Pt guided in attempt to find midline and hold position. Upon sitting EOB, pt self realizes positioning and with RUE on bedrail is able to get self to midline and hold for few seconds prior to fatigue and LOB with melt to L side. Continuous vc required for attempt to return to midline with lean to R.  Pt also leans forward d/t kyphotic posturing and requires vc/ tc for correction in forward lean. Attempts for reach to target with pt having difficulty finding target without vc as to location and turn of head required. NMR performed for improvements in motor control and coordination, balance, sequencing, judgement, and self confidence/ efficacy in performing all aspects of mobility at highest level of independence.   Patient supine in bed at end of session with brakes locked, bed alarm set, and all needs within reach.   Therapy Documentation Precautions:  Precautions Precautions: Fall Precaution Comments: L hemipareisis with L neglect, intermittent pusher Restrictions Weight Bearing Restrictions: No General:   Vital Signs: Therapy Vitals Temp: 98.6 F (37 C) Temp Source: Axillary Pulse Rate: 71 Resp: (!) 22 (rn notified) BP: 122/67 Patient Position (if appropriate): Lying Oxygen Therapy SpO2: 93 % O2 Device: Room Air Pain:  Pain related at L ribcage during transfers and reduces with repositioning of hand placement and rest/ return to supine.   Therapy/Group: Individual Therapy  Alger Simons PT, DPT, CSRS 05/09/2022, 5:11 PM

## 2022-05-09 NOTE — Progress Notes (Signed)
Physical Therapy Session Note  Patient Details  Name: Kara [name] MRN: [id number] Date of Birth: August 01, 1922  Today's Date: 05/09/2022 PT Individual Time:1120-1200 and 1630-1725    55 min 40 min   Short Term Goals: Week 1:  PT Short Term Goal 1 (Week 1): Pt will perform bed mobility with consistent ModA and improved initiation. PT Short Term Goal 2 (Week 1): Pt will perform sit<>stand with consistent ModA PT Short Term Goal 3 (Week 1): Pt will improve sitting balance to find midline and hold sitting balance for at least with overall Min/ ModA. PT Short Term Goal 4 (Week 1): Pt will tolerate standing with MaxA for at least 30sec.   Skilled Therapeutic Interventions/Progress Updates:  Session 1  Pt received supine in bed and agreeable to PT. Supine>sit transfer with max assist and cues for sequencing. Stand pivot transfer to [facility] with max assist and cues for step/pivot.    Sit<>stand with HHA on the LUE to pervent posterior LOB performed with visual feedback from mirror x 5 with standing tolerance x 10 sec    Seated Lateral reaching and cross body reach to improve visual scanning to the L x 5 bil with the RUE.   Patient returned to room and left sitting in [facility] with call bell in reach and all needs met.    Session 2.  Pt received supine in bed and agreeable to PT. Supine>sit transfer with max assist cues for posture and midline. Stedy transfer to toilet with max assist and cues max cues for midline. Pt required max cues for midline and balance while sitting on toilet.  Ableto void bladder and perofrm hygiene in sitting. Marland Kitchen   BITS visual scanning user paced x 4 bouts. Able to locate 9, 21, 20 and 22 targets with max cus for visual scanning to the L with RUE. Cues for midline and head positioning to initiate proper sitting posture.   Pt returned to room and performed stand pviot transfer to bed with mod assist and RUE supported on bed rail. Sit>supine completed with max assist, and left  supine in bed with call bell in reach and all needs met.        Therapy Documentation Precautions:  Precautions Precautions: Fall Precaution Comments: L hemipareisis with L neglect, intermittent pusher Restrictions Weight Bearing Restrictions: No  Pain:  Faces: hurts a little more. Generalized. Pt repositioned    Therapy/Group: Individual Therapy  Golden Pop 05/09/2022, 8:07 AM

## 2022-05-10 LAB — GLUCOSE, CAPILLARY
Glucose-Capillary: 137 mg/dL — ABNORMAL HIGH (ref 70–99)
Glucose-Capillary: 201 mg/dL — ABNORMAL HIGH (ref 70–99)
Glucose-Capillary: 152 mg/dL — ABNORMAL HIGH (ref 70–99)
Glucose-Capillary: 207 mg/dL — ABNORMAL HIGH (ref 70–99)

## 2022-05-10 LAB — URINALYSIS, ROUTINE W REFLEX MICROSCOPIC
Bilirubin Urine: NEGATIVE
Glucose, UA: NEGATIVE mg/dL
Hgb urine dipstick: NEGATIVE
Ketones, ur: 5 mg/dL — AB
Nitrite: NEGATIVE
Protein, ur: 100 mg/dL — AB
Specific Gravity, Urine: 1.012 (ref 1.005–1.030)
WBC, UA: >50 WBC/hpf — ABNORMAL HIGH (ref 0–5)
pH: 7 (ref 5.0–8.0)

## 2022-05-10 LAB — BASIC METABOLIC PANEL
Anion gap: 13 (ref 5–15)
BUN: 16 mg/dL (ref 8–23)
CO2: 26 mmol/L (ref 22–32)
Calcium: 8.9 mg/dL (ref 8.9–10.3)
Chloride: 99 mmol/L (ref 98–111)
Creatinine, Ser: 0.83 mg/dL (ref 0.44–1.00)
GFR, Estimated: >60 mL/min (ref >60)
Glucose, Bld: 133 mg/dL — ABNORMAL HIGH (ref 70–99)
Potassium: 4 mmol/L (ref 3.5–5.1)
Sodium: 138 mmol/L (ref 135–145)

## 2022-05-10 MED ORDER — CEPHALEXIN 250 MG PO CAPS
500.0000 mg | ORAL_CAPSULE | Freq: Two times a day (BID) | ORAL | Status: AC
  Administered 2022-05-10 – 2022-05-17 (×13): 500 mg via ORAL
  Filled 2022-05-10 (×14): qty 2

## 2022-05-10 MED ORDER — FLUCONAZOLE 100 MG PO TABS
100.0000 mg | ORAL_TABLET | Freq: Every day | ORAL | Status: DC
  Administered 2022-05-10: 100 mg via ORAL
  Filled 2022-05-10: qty 1

## 2022-05-10 NOTE — Progress Notes (Signed)
Physical Therapy Session Note  Patient Details  Name: Kara [name] MRN: [id number] Date of Birth: May 23, 1922  Today's Date: 05/10/2022 PT Individual Time: 1118-1202 PT Individual Time Calculation (min): 44 min   Short Term Goals: Week 1:  PT Short Term Goal 1 (Week 1): Pt will perform bed mobility with consistent ModA and improved initiation. PT Short Term Goal 2 (Week 1): Pt will perform sit<>stand with consistent ModA PT Short Term Goal 3 (Week 1): Pt will improve sitting balance to find midline and hold sitting balance for at least 49mn with overall Min/ ModA. PT Short Term Goal 4 (Week 1): Pt will tolerate standing with MaxA for at least 30sec. Week 2:     Skilled Therapeutic Interventions/Progress Updates:  Patient supine in bed on entrance to room. Patient awake, alert, and agreeable to PT session.   Patient with no pain complaint at start of session.  Therapeutic Activity: Bed Mobility: Pt performed supine --> sit with MinA to bring BLE off EOB and Mod/ MaxA for UB to upright seated position d/t reduced ability to coordinate roll to R side then use RUE effectively to push up from bed surface. VC/ tc required for technique and sequencing throughout. Return to supine with pt impulsively bringing UB to bed toward R side with no physical assist but unable to bring BLE to bed surface. C/o pain in her low back. Explained that she will continue to have pain with torque to low back if she cannot bring BLE up to bed surface at same time. Pain improves with proper positioning. Pt is able to bend RLE to assist with move toward HOB. Bed features used to assist.  Neuromuscular Re-ed: NMR facilitated during session with focus on sitting balance. Pt guided in attempts to find and hold midline. Continues to demo severe lean and intermittent push to L Finds midline on own once and then requires extensive vc/ tc and up to [place] attain and maintain. Pt continues to demo severe decrease in field cut  that affects ability to find balance.  NMR performed for improvements in motor control and coordination, balance, sequencing, judgement, and self confidence/ efficacy in performing all aspects of mobility at highest level of independence.   Patient supine in bed at end of session with brakes locked, bed alarm set, and all needs within reach.   Therapy Documentation Precautions:  Precautions Precautions: Fall Precaution Comments: L hemipareisis with L neglect, intermittent pusher Restrictions Weight Bearing Restrictions: No General:   Vital Signs: Therapy Vitals Temp: 98.1 F (36.7 C) Pulse Rate: 91 Resp: 17 BP: (!) 144/48 Patient Position (if appropriate): Lying Oxygen Therapy SpO2: 94 % O2 Device: Room Air Pain:  Minimal pain complaint with touch to BLE. Some low back pain related with pt's return to bed. Addressed with repositioning  Therapy/Group: Individual Therapy  JAlger Simons12/23/2023, 7:57 AM

## 2022-05-10 NOTE — Progress Notes (Signed)
PROGRESS NOTE   Subjective/Complaints: +dysuria: UA ordered and is positive. Keflex started. F/u UC Daughter notes SCDs were not placed last night   ROS- neg for abd pain, occ nausea , no CP or SOB, +neuropathy Objective:   No results found. Recent Labs    05/08/22 0600  WBC 8.5  HGB 13.3  HCT 38.6  PLT 198   Recent Labs    05/09/22 0534 05/10/22 0543  NA 138 138  K 2.9* 4.0  CL 95* 99  CO2 31 26  GLUCOSE 123* 133*  BUN 11 16  CREATININE 0.75 0.83  CALCIUM 9.1 8.9    Intake/Output Summary (Last 24 hours) at 05/10/2022 1654 Last data filed at 05/10/2022 1312 Gross per 24 hour  Intake 717 ml  Output 150 ml  Net 567 ml        Physical Exam: Vital Signs Blood pressure 120/61, pulse 72, temperature 98.5 F (36.9 C), temperature source Oral, resp. rate 16, weight 56.7 kg, SpO2 95 %.  General: No acute distress Mood and affect are appropriate Heart: Regular rate and rhythm no rubs murmurs or extra sounds Lungs: Clear to auscultation, breathing unlabored, no rales or wheezes Abdomen: Positive bowel sounds, soft nontender to palpation, nondistended Extremities: edema and ecchymosis LUE, no edema in lower extremities Skin: warm and dry Neurologic: Cranial nerves II through XII intact, motor strength is 5/5 in right and 0/5deltoid, bicep, tricep, grip, hip flexor, knee extensors, ankle dorsiflexor and plantar flexor Sensory exam reduced sensation to LT in LUE and LLE  Musculoskeletal: Full range of motion in all 4 extremities. No joint swelling, mild  tenderness throughout b/l UE and LE   Assessment/Plan: 1. Functional deficits which require 3+ hours per day of interdisciplinary therapy in a comprehensive inpatient rehab setting. Physiatrist is providing close team supervision and 24 hour management of active medical problems listed below. Physiatrist and rehab team continue to assess barriers to  discharge/monitor patient progress toward functional and medical goals  Care Tool:  Bathing    Body parts bathed by patient: Chest, Abdomen, Right upper leg, Face     Body parts n/a: Front perineal area, Buttocks, Right lower leg, Left lower leg, Left upper leg, Left arm, Right arm   Bathing assist Assist Level: Maximal Assistance - Patient 24 - 49%     Upper Body Dressing/Undressing Upper body dressing   What is the patient wearing?: Bra, Button up shirt    Upper body assist Assist Level: Total Assistance - Patient < 25%    Lower Body Dressing/Undressing Lower body dressing      What is the patient wearing?: Incontinence brief, Pants     Lower body assist Assist for lower body dressing: Total Assistance - Patient < 25%     Toileting Toileting Toileting Activity did not occur (Clothing management and hygiene only): N/A (no void or bm)  Toileting assist Assist for toileting: Total Assistance - Patient < 25%     Transfers Chair/bed transfer  Transfers assist     Chair/bed transfer assist level: Total Assistance - Patient < 25%     Locomotion Ambulation   Ambulation assist   Ambulation activity did not occur: Safety/medical  concerns          Walk 10 feet activity   Assist  Walk 10 feet activity did not occur: Safety/medical concerns        Walk 50 feet activity   Assist Walk 50 feet with 2 turns activity did not occur: Safety/medical concerns         Walk 150 feet activity   Assist Walk 150 feet activity did not occur: Safety/medical concerns         Walk 10 feet on uneven surface  activity   Assist Walk 10 feet on uneven surfaces activity did not occur: Safety/medical concerns         Wheelchair     Assist Is the patient using a wheelchair?: Yes Type of Wheelchair: Manual Wheelchair activity did not occur: Safety/medical concerns         Wheelchair 50 feet with 2 turns activity    Assist    Wheelchair 50  feet with 2 turns activity did not occur: Safety/medical concerns       Wheelchair 150 feet activity     Assist  Wheelchair 150 feet activity did not occur: Safety/medical concerns       Blood pressure 120/61, pulse 72, temperature 98.5 F (36.9 C), temperature source Oral, resp. rate 16, weight 56.7 kg, SpO2 95 %.  Medical Problem List and Plan: 1. Functional deficits secondary to right MCA infarction, A2 and P2 occlusion status post TNK/status post thrombectomy             -patient may  shower             -ELOS/Goals: 14-16d Min/Mod A goals for ADLs and Mobility   Continue CIR PT/OT/SLP  2.  Antithrombotics: -DVT/anticoagulation:  SCD             -antiplatelet therapy: Eliquis 2.5 mg BID 3. Diabetic peripheral neuropathy: Discussed [facility] outpatient and daughter would like for her to try- will ask April to schedule. Lidoderm patch.  Tramadol as needed  -Continue tramadol PRN, daughter reports poor response to a nerve pain medication but does not remember name of this medication  -Daughter to ask SNF what medicine caused her to feel very nauseated in past, consider gabapentin 4. Mood/Behavior/Sleep: Provide emotional support             -antipsychotic agents: N/A 5. Neuropsych/cognition: This patient IS capable of making decisions on her own behalf. 6. Skin/Wound Care: Routine skin checks 7. Fluids/Electrolytes/Nutrition: Routine in and outs with follow-up chemistries 8.  Atrial fibrillation/pacemaker.  Lopressor 12.5 mg every 12 hours.  Cardiac rate controlled  Eliquis 2.'5mg'$  BID 9.  Diabetes mellitus.  Hemoglobin A1c 6.9.  SSI. CBG (last 3)  Recent Labs    05/09/22 2111 05/10/22 0522 05/10/22 1141  GLUCAP 199* 137* 201*  12/22 well controlled overall  10.  Dysphagia.  Dysphagia #3 thin liquids.  Follow-up speech therapy 11.  GERD.  Protonix  12.  Hypo K+ improved after po supplementation cont to monitor  -BMP tomorrow ordered    Latest Ref Rng & Units 05/10/2022     5:43 AM 05/09/2022    5:34 AM 05/06/2022    5:41 AM  BMP  Glucose 70 - 99 mg/dL 133  123  140   BUN 8 - 23 mg/dL '16  11  14   '$ Creatinine 0.44 - 1.00 mg/dL 0.83  0.75  0.88   Sodium 135 - 145 mmol/L 138  138  137   Potassium 3.5 - 5.1  mmol/L 4.0  2.9  3.5   Chloride 98 - 111 mmol/L 99  95  96   CO2 22 - 32 mmol/L '26  31  30   '$ Calcium 8.9 - 10.3 mg/dL 8.9  9.1  8.7    -Discussed with pharmacy, recommended KCL oral 54mq x2 and recheck tomorrow , appreciate assistance  13.  Thrush  -s/p diflucan 12/20  -Improved 14. Moderate malnutrition  -Consult dietician 15. Lower extremity edema: resolved, placed order to place SCDs at night for circulation. 16. UTI: keflex started, f/u UC     LOS: 5 days A FACE TO FACE EVALUATION WAS PERFORMED  KClide DeutscherRaulkar 05/10/2022, 4:54 PM

## 2022-05-10 NOTE — Progress Notes (Signed)
Fluconazole ordered for thrush. Due to age and crcl we will give fluconazole '100mg'$  qday. Watch for bleeding with apixaban.   [facility] for 5-7 days  Onnie Boer, PharmD, [place], AAHIVP, CPP Infectious Disease Pharmacist 05/10/2022 9:58 PM

## 2022-05-10 NOTE — Progress Notes (Signed)
Occupational Therapy Session Note  Patient Details  Name: Kara [name] MRN: [id number] Date of Birth: [birth year]  Today's Date: 05/10/2022 OT Individual Time: 1304-1400 OT Individual Time Calculation (min): 56 min    Short Term Goals: Week 1:  OT Short Term Goal 1 (Week 1): Patient will sit at edge of bed with min assist x 30 seconds in preparation for level surface trasnfer OT Short Term Goal 2 (Week 1): Patient will visually locate wheelchair placed to left of midline with mod cueing prior to transfer OT Short Term Goal 3 (Week 1): Patient will trasnfer from bed to wheelchair with mod assist in preparation for completing bathing/ hygiene at sink. OT Short Term Goal 4 (Week 1): Patient will physically locate left arm on body and position in preparation for rolling or trasnfer with mod cueing and min assist OT Short Term Goal 5 (Week 1): Patient will attend to self in mirror (midline)with min prompting x 10-15 seconds when completing am ADL   Skilled Therapeutic Interventions/Progress Updates:    Pt received supine with daughter present initially. Pt agreeable to session with encouragement. Pt very sensitive to touch and raising voice with therapist often about being too rough despite gentle facilitation. Pt came to EOB with total A and then once EOB she required mod A initially for sitting balance, however she was able to come into R lateral lean on her R elbow with only verbal cueing and then was able to maintain balance with supervision. This was completed several times to reduce pushing/lateral lean L. She required max A to squat pivot to the w/c. She completed oral care at the sink with set up assist and cueing. Attempted to facilitate her LUE into a functional hand washing task but she became increasingly agitated d/t pain from OT handling her LUE. She washed her face with (S). She was taken to the therapy gym. She completed 3x sit <> stand at the high-low table with max A before declining  to stand anymore d/t pain "all over". Her LUE was placed in the [place] mobile arm support to complete functional reaching in all planes of movement. She required heavy cueing for L attention and often required tactile cue to maintain attention visually. She was able to activate all planes of movement with the saebo providing level 3 resistance and joint approximation. Reduced pain compared to OT handling. Pt performed several repetitions before asking to go back to bed and closing her eyes. She required max A for transfer back to bed and was left supine with all needs met. Bed alarm set 15 min missed.   Therapy Documentation Precautions:  Precautions Precautions: Fall Precaution Comments: L hemipareisis with L neglect, intermittent pusher Restrictions Weight Bearing Restrictions: No General: General OT Amount of Missed Time: 15 Minutes  Therapy/Group: Individual Therapy  Curtis Sites 05/10/2022, 2:01 PM

## 2022-05-10 NOTE — Progress Notes (Signed)
Physical Therapy Session Note  Patient Details  Name: Kara [name] MRN: [id number] Date of Birth: 1923/03/27  Today's Date: 05/10/2022 PT Individual Time: 1118-1202 PT Individual Time Calculation (min): 44 min   Short Term Goals: Week 1:  PT Short Term Goal 1 (Week 1): Pt will perform bed mobility with consistent ModA and improved initiation. PT Short Term Goal 2 (Week 1): Pt will perform sit<>stand with consistent ModA PT Short Term Goal 3 (Week 1): Pt will improve sitting balance to find midline and hold sitting balance for at least 38mn with overall Min/ ModA. PT Short Term Goal 4 (Week 1): Pt will tolerate standing with MaxA for at least 30sec.  Skilled Therapeutic Interventions/Progress Updates:    Chart reviewed and pt agreeable to therapy. Pt received semi-reclined in bed with general c/o pain that pt could not localize or quantify. Also of note, pt first stated she had a headache, but then stated she did not have one at present time. Pt stated she had urgency to urinate. Session focused on functional transfers for toileting to promote independence with functional mobility. Pt initiated session with MaxA to transfer to EOB. Pt c/o increased pn with movement. Pt then required [place] 2 for stand in STEDY and MaxA for balance during STEDY transfer. [facility] and required pillow propping for stability. Pt noted to c/o burning with urination. NT in room and notified for f/u. Pt then required MaxA + 2 for sit to stand in STEDY and return to bed. Pt c/o high pain t/o body and assisted with repositioning. At end of session, pt was left seated in bed in chair position with alarm engaged, nurse call bell and all needs in reach.     Therapy Documentation Precautions:  Precautions Precautions: Fall Precaution Comments: L hemipareisis with L neglect, intermittent pusher Restrictions Weight Bearing Restrictions: No   Therapy/Group: Individual Therapy  KMarquette Old PT,  DPT 05/10/2022, 12:36 PM

## 2022-05-10 NOTE — Plan of Care (Signed)
  Problem: Consults Goal: RH STROKE PATIENT EDUCATION Description: See Patient Education module for education specifics  Outcome: Progressing   Problem: RH BOWEL ELIMINATION Goal: RH STG MANAGE BOWEL WITH ASSISTANCE Description: STG Manage Bowel with mod I  Assistance. Outcome: Progressing Goal: RH STG MANAGE BOWEL W/MEDICATION W/ASSISTANCE Description: STG Manage Bowel with Medication with mod I Assistance. Outcome: Progressing   Problem: RH SAFETY Goal: RH STG ADHERE TO SAFETY PRECAUTIONS W/ASSISTANCE/DEVICE Description: STG Adhere to Safety Precautions With cues Assistance/Device. Outcome: Progressing   Problem: RH PAIN MANAGEMENT Goal: RH STG PAIN MANAGED AT OR BELOW PT'S PAIN GOAL Description: < 4 with prns Outcome: Progressing   Problem: RH KNOWLEDGE DEFICIT Goal: RH STG INCREASE KNOWLEDGE OF DIABETES Description: Patient and dtr will be able to manage DM with dietary modifications using educational handouts independently Outcome: Progressing Goal: RH STG INCREASE KNOWLEDGE OF HYPERTENSION Description: Patient and dtr will be able to manage HTN with medications and dietary modifications using educational handouts independently Outcome: Progressing Goal: RH STG INCREASE KNOWLEDGE OF DYSPHAGIA/FLUID INTAKE Description: Patient and dtr will be able to manage dysphagia, medications and dietary modifications using educational handouts independently Outcome: Progressing Goal: RH STG INCREASE KNOWLEGDE OF HYPERLIPIDEMIA Description: Patient and dtr will be able to manage HLD with medications and dietary modifications using educational handouts independently Outcome: Progressing Goal: RH STG INCREASE KNOWLEDGE OF STROKE PROPHYLAXIS Description: Patient and dtr will be able to manage secondary risk with medications and dietary modifications using educational handouts independently Outcome: Progressing   Problem: Education: Goal: Understanding of CV disease, CV risk reduction,  and recovery process will improve Outcome: Progressing Goal: Individualized Educational Video(s) Outcome: Progressing

## 2022-05-11 LAB — GLUCOSE, CAPILLARY
Glucose-Capillary: 146 mg/dL — ABNORMAL HIGH (ref 70–99)
Glucose-Capillary: 170 mg/dL — ABNORMAL HIGH (ref 70–99)
Glucose-Capillary: 142 mg/dL — ABNORMAL HIGH (ref 70–99)
Glucose-Capillary: 224 mg/dL — ABNORMAL HIGH (ref 70–99)

## 2022-05-11 NOTE — Progress Notes (Signed)
PROGRESS NOTE   Subjective/Complaints: Patient wants one time diflucan dose because she usually develops a yeast infection after starting antibiotics. Daughter asks whether or not she should take probiotics  ROS- neg for abd pain, occ nausea , no CP or SOB, +neuropathy in feet, +fatigue when she tried gabapentin in past Objective:   No results found. No results for input(s): "WBC", "HGB", "HCT", "PLT" in the last 72 hours.  Recent Labs    05/09/22 0534 05/10/22 0543  NA 138 138  K 2.9* 4.0  CL 95* 99  CO2 31 26  GLUCOSE 123* 133*  BUN 11 16  CREATININE 0.75 0.83  CALCIUM 9.1 8.9    Intake/Output Summary (Last 24 hours) at 05/11/2022 1819 Last data filed at 05/11/2022 1400 Gross per 24 hour  Intake 836 ml  Output 100 ml  Net 736 ml        Physical Exam: Vital Signs Blood pressure 112/70, pulse 82, temperature 98.3 F (36.8 C), resp. rate 16, weight 56.7 kg, SpO2 94 %.  General: No acute distress Mood and affect are appropriate Heart: Regular rate and rhythm no rubs murmurs or extra sounds Tongue: minimal thrush left.  Lungs: Clear to auscultation, breathing unlabored, no rales or wheezes Abdomen: Positive bowel sounds, soft nontender to palpation, nondistended Extremities: edema and ecchymosis LUE, no edema in lower extremities Skin: warm and dry Neurologic: Cranial nerves II through XII intact, motor strength is 5/5 in right and 0/5deltoid, bicep, tricep, grip, hip flexor, knee extensors, ankle dorsiflexor and plantar flexor Sensory exam reduced sensation to LT in LUE and LLE  Musculoskeletal: Full range of motion in all 4 extremities. No joint swelling, mild  tenderness throughout b/l UE and LE   Assessment/Plan: 1. Functional deficits which require 3+ hours per day of interdisciplinary therapy in a comprehensive inpatient rehab setting. Physiatrist is providing close team supervision and 24 hour  management of active medical problems listed below. Physiatrist and rehab team continue to assess barriers to discharge/monitor patient progress toward functional and medical goals  Care Tool:  Bathing    Body parts bathed by patient: Chest, Abdomen, Right upper leg, Face     Body parts n/a: Front perineal area, Buttocks, Right lower leg, Left lower leg, Left upper leg, Left arm, Right arm   Bathing assist Assist Level: Maximal Assistance - Patient 24 - 49%     Upper Body Dressing/Undressing Upper body dressing   What is the patient wearing?: Bra, Button up shirt    Upper body assist Assist Level: Total Assistance - Patient < 25%    Lower Body Dressing/Undressing Lower body dressing      What is the patient wearing?: Incontinence brief, Pants     Lower body assist Assist for lower body dressing: Total Assistance - Patient < 25%     Toileting Toileting Toileting Activity did not occur (Clothing management and hygiene only): N/A (no void or bm)  Toileting assist Assist for toileting: Total Assistance - Patient < 25%     Transfers Chair/bed transfer  Transfers assist     Chair/bed transfer assist level: Total Assistance - Patient < 25%     Locomotion Ambulation  Ambulation assist   Ambulation activity did not occur: Safety/medical concerns          Walk 10 feet activity   Assist  Walk 10 feet activity did not occur: Safety/medical concerns        Walk 50 feet activity   Assist Walk 50 feet with 2 turns activity did not occur: Safety/medical concerns         Walk 150 feet activity   Assist Walk 150 feet activity did not occur: Safety/medical concerns         Walk 10 feet on uneven surface  activity   Assist Walk 10 feet on uneven surfaces activity did not occur: Safety/medical concerns         Wheelchair     Assist Is the patient using a wheelchair?: Yes Type of Wheelchair: Manual Wheelchair activity did not occur:  Safety/medical concerns         Wheelchair 50 feet with 2 turns activity    Assist    Wheelchair 50 feet with 2 turns activity did not occur: Safety/medical concerns       Wheelchair 150 feet activity     Assist  Wheelchair 150 feet activity did not occur: Safety/medical concerns       Blood pressure 112/70, pulse 82, temperature 98.3 F (36.8 C), resp. rate 16, weight 56.7 kg, SpO2 94 %.  Medical Problem List and Plan: 1. Functional deficits secondary to right MCA infarction, A2 and P2 occlusion status post TNK/status post thrombectomy             -patient may  shower             -ELOS/Goals: 14-16d Min/Mod A goals for ADLs and Mobility   Continue CIR PT/OT/SLP  2.  Antithrombotics: -DVT/anticoagulation:  SCD             -antiplatelet therapy: Eliquis 2.5 mg BID 3. Diabetic peripheral neuropathy: Discussed [facility] outpatient and daughter would like for her to try- will ask April to schedule. Lidoderm patch.  Tramadol as needed  -Continue tramadol PRN, daughter reports poor response to a nerve pain medication but does not remember name of this medication  -Daughter to ask SNF what medicine caused her to feel very nauseated in past, consider gabapentin 4. Mood/Behavior/Sleep: Provide emotional support             -antipsychotic agents: N/A 5. Neuropsych/cognition: This patient IS capable of making decisions on her own behalf. 6. Skin/Wound Care: Routine skin checks 7. Fluids/Electrolytes/Nutrition: Routine in and outs with follow-up chemistries 8.  Atrial fibrillation/pacemaker.  continue Lopressor 12.5 mg every 12 hours.  Cardiac rate controlled  Eliquis 2.'5mg'$  BID 9.  Diabetes mellitus.  Hemoglobin A1c 6.9.  SSI. CBG (last 3)  Recent Labs    05/11/22 0554 05/11/22 1120 05/11/22 1616  GLUCAP 146* 170* 142*  12/23 well controlled overall  10.  Dysphagia.  Dysphagia #3 thin liquids.  Follow-up speech therapy 11.  GERD. continue Protonix  12.  Hypo K+ improved  after po supplementation cont to monitor  -BMP tomorrow ordered    Latest Ref Rng & Units 05/10/2022    5:43 AM 05/09/2022    5:34 AM 05/06/2022    5:41 AM  BMP  Glucose 70 - 99 mg/dL 133  123  140   BUN 8 - 23 mg/dL '16  11  14   '$ Creatinine 0.44 - 1.00 mg/dL 0.83  0.75  0.88   Sodium 135 - 145 mmol/L 138  138  137   Potassium 3.5 - 5.1 mmol/L 4.0  2.9  3.5   Chloride 98 - 111 mmol/L 99  95  96   CO2 22 - 32 mmol/L '26  31  30   '$ Calcium 8.9 - 10.3 mg/dL 8.9  9.1  8.7    -Discussed with pharmacy, recommended KCL oral 27mq x2 and recheck tomorrow , appreciate assistance  13.  Thrush  -s/p diflucan 12/20  -Improved 14. Moderate malnutrition  -Consult dietician 15. Lower extremity edema: resolved, placed order to place SCDs at night for circulation. 16. UTI: keflex started, f/u UC     LOS: 6 days A FACE TO FACE EVALUATION WAS PERFORMED  KIzora Ribas12/24/2023, 6:19 PM

## 2022-05-11 NOTE — Progress Notes (Signed)
Occupational Therapy Session Note  Patient Details  Name: Kara [name] MRN: [id number] Date of Birth: [birth year]  Today's Date: 05/11/2022 OT Individual Time: 1415-1530 OT Individual Time Calculation (min): 75 min    Short Term Goals: Week 1:  OT Short Term Goal 1 (Week 1): Patient will sit at edge of bed with min assist x 30 seconds in preparation for level surface trasnfer OT Short Term Goal 2 (Week 1): Patient will visually locate wheelchair placed to left of midline with mod cueing prior to transfer OT Short Term Goal 3 (Week 1): Patient will trasnfer from bed to wheelchair with mod assist in preparation for completing bathing/ hygiene at sink. OT Short Term Goal 4 (Week 1): Patient will physically locate left arm on body and position in preparation for rolling or trasnfer with mod cueing and min assist OT Short Term Goal 5 (Week 1): Patient will attend to self in mirror (midline)with min prompting x 10-15 seconds when completing am ADL  Skilled Therapeutic Interventions/Progress Updates:    Pt received in bed with her friend and friend's husband present.  Pt alert and agreeable to therapy. Overall pt tolerated therapy with hands on facilitation better today with less c/o sensitivity.  She did get upset with this therapist when pt was perched in stedy leaning heavily to the L and therapist holding her from falling. Pt not aware she was falling, only concerned with being touched.  Pt initially sat to EOB and for the first several minutes demonstrated improved static sit with min A. Pt following directions to lean to R to shift weight to her center. Pt requested to toilet. Due to improved sit balance, tried using stedy with pt to get her to [facility] over toilet.  Pt helped to actively place feet on stedy.  Max A to stand. Once in stedy in both standing and sitting perched on pads, pt severely leaning L and was unable to correct her. Moved stedy in front of mirror to see if visual feedback would help.  Pt unable to see herself in mirror unless her head was turned to L at 45 degree angle.  It was not safe to transfer to [facility] so moved her back to bed for bed pan. Pt then stated she did not need to go.   Positioned pt in upright chair position. Spent most of the session working on facilitating LUE, L visual scanning with stacking cones, reaching for beanbags, B hands on dowel bar with arm reaches, and single L arm aROM on powder board.    Pt's attention to each task very short and needed frequent cues to attend to task and to L hand. Pt unable to see hand on L side of body unless she had head rotated to 45 degrees.  She has more active ROM and can lift hand to mouth, lift arm off of pillow and even grasp cones.  She does not have sensation and is not aware of hand unless she is looking at it with max cues.  Intermittent hypertone prevented smooth movement.  At end of session, pt requesting to toilet.   Assisted NT to get pt on bed pan. No c/o pain with bedpan.  NT with pt.    Therapy Documentation Precautions:  Precautions Precautions: Fall Precaution Comments: L hemipareisis with L neglect, intermittent pusher Restrictions Weight Bearing Restrictions: No    Vital Signs: Therapy Vitals Temp: 98.3 F (36.8 C) Pulse Rate: 82 Resp: 16 BP: 112/70 Patient Position (if appropriate): Lying Oxygen Therapy  SpO2: 94 % O2 Device: Room Air Pain:   ADL: ADL Eating: Not assessed Grooming: Maximal assistance Where Assessed-Grooming: Sitting at sink Upper Body Bathing: Maximal assistance Where Assessed-Upper Body Bathing: Sitting at sink Lower Body Bathing: Maximal assistance Where Assessed-Lower Body Bathing: Sitting at sink Upper Body Dressing: Dependent Where Assessed-Upper Body Dressing: Sitting at sink Lower Body Dressing: Dependent Where Assessed-Lower Body Dressing: Sitting at sink Toileting: Unable to assess Toilet Transfer: Unable to assess Toilet Transfer Method: Unable to  assess Tub/Shower Transfer: Unable to assess Tub/Shower Transfer Method: Unable to assess Social research officer, government: Unable to assess Social research officer, government Method: Unable to assess ADL Comments: Patient with significant pain with all movement - decreased postural control, pushing to weaker left side   Therapy/Group: Individual Therapy  [place] 05/11/2022, 3:59 PM

## 2022-05-11 NOTE — Plan of Care (Signed)
Problem: Consults Goal: RH STROKE PATIENT EDUCATION Description: See Patient Education module for education specifics  05/11/2022 1047 by Sanda Linger, RN Outcome: Progressing 05/11/2022 1047 by Sanda Linger, RN Outcome: Progressing   Problem: RH BOWEL ELIMINATION Goal: RH STG MANAGE BOWEL WITH ASSISTANCE Description: STG Manage Bowel with mod I  Assistance. 05/11/2022 1047 by Sanda Linger, RN Outcome: Progressing 05/11/2022 1047 by Sanda Linger, RN Outcome: Progressing Goal: RH STG MANAGE BOWEL W/MEDICATION W/ASSISTANCE Description: STG Manage Bowel with Medication with mod I Assistance. 05/11/2022 1047 by Sanda Linger, RN Outcome: Progressing 05/11/2022 1047 by Sanda Linger, RN Outcome: Progressing   Problem: RH SAFETY Goal: RH STG ADHERE TO SAFETY PRECAUTIONS W/ASSISTANCE/DEVICE Description: STG Adhere to Safety Precautions With cues Assistance/Device. 05/11/2022 1047 by Sanda Linger, RN Outcome: Progressing 05/11/2022 1047 by Sanda Linger, RN Outcome: Progressing   Problem: RH PAIN MANAGEMENT Goal: RH STG PAIN MANAGED AT OR BELOW PT'S PAIN GOAL Description: < 4 with prns 05/11/2022 1047 by Sanda Linger, RN Outcome: Progressing 05/11/2022 1047 by Sanda Linger, RN Outcome: Progressing   Problem: RH KNOWLEDGE DEFICIT Goal: RH STG INCREASE KNOWLEDGE OF DIABETES Description: Patient and dtr will be able to manage DM with dietary modifications using educational handouts independently 05/11/2022 1047 by Sanda Linger, RN Outcome: Progressing 05/11/2022 1047 by Sanda Linger, RN Outcome: Progressing Goal: RH STG INCREASE KNOWLEDGE OF HYPERTENSION Description: Patient and dtr will be able to manage HTN with medications and dietary modifications using educational handouts independently 05/11/2022 1047 by Sanda Linger, RN Outcome: Progressing 05/11/2022 1047 by Sanda Linger, RN Outcome: Progressing Goal: RH STG INCREASE KNOWLEDGE OF  DYSPHAGIA/FLUID INTAKE Description: Patient and dtr will be able to manage dysphagia, medications and dietary modifications using educational handouts independently 05/11/2022 1047 by Sanda Linger, RN Outcome: Progressing 05/11/2022 1047 by Sanda Linger, RN Outcome: Progressing Goal: RH STG INCREASE KNOWLEGDE OF HYPERLIPIDEMIA Description: Patient and dtr will be able to manage HLD with medications and dietary modifications using educational handouts independently 05/11/2022 1047 by Sanda Linger, RN Outcome: Progressing 05/11/2022 1047 by Sanda Linger, RN Outcome: Progressing Goal: RH STG INCREASE KNOWLEDGE OF STROKE PROPHYLAXIS Description: Patient and dtr will be able to manage secondary risk with medications and dietary modifications using educational handouts independently 05/11/2022 1047 by Sanda Linger, RN Outcome: Progressing 05/11/2022 1047 by Sanda Linger, RN Outcome: Progressing   Problem: Education: Goal: Understanding of CV disease, CV risk reduction, and recovery process will improve 05/11/2022 1047 by Sanda Linger, RN Outcome: Progressing 05/11/2022 1047 by Sanda Linger, RN Outcome: Progressing Goal: Individualized Educational Video(s) 05/11/2022 1047 by Sanda Linger, RN Outcome: Progressing 05/11/2022 1047 by Sanda Linger, RN Outcome: Progressing   Problem: Activity: Goal: Ability to return to baseline activity level will improve Outcome: Progressing   Problem: Cardiovascular: Goal: Ability to achieve and maintain adequate cardiovascular perfusion will improve Outcome: Progressing Goal: Vascular access site(s) Level 0-1 will be maintained Outcome: Progressing   Problem: Health Behavior/Discharge Planning: Goal: Ability to safely manage health-related needs after discharge will improve Outcome: Progressing   Problem: Education: Goal: Knowledge of disease or condition will improve Outcome: Progressing Goal: Knowledge of secondary  prevention will improve (MUST DOCUMENT ALL) Outcome: Progressing Goal: Knowledge of patient specific risk factors will improve [place] N/A or DELETE if not current risk factor) Outcome: Progressing   Problem: Ischemic Stroke/TIA Tissue Perfusion: Goal: Complications of ischemic stroke/TIA will be minimized Outcome: Progressing  Problem: Coping: Goal: Will verbalize positive feelings about self Outcome: Progressing Goal: Will identify appropriate support needs Outcome: Progressing   Problem: Health Behavior/Discharge Planning: Goal: Ability to manage health-related needs will improve Outcome: Progressing Goal: Goals will be collaboratively established with patient/family Outcome: Progressing   Problem: Self-Care: Goal: Ability to participate in self-care as condition permits will improve Outcome: Progressing Goal: Verbalization of feelings and concerns over difficulty with self-care will improve Outcome: Progressing Goal: Ability to communicate needs accurately will improve Outcome: Progressing   Problem: Nutrition: Goal: Risk of aspiration will decrease Outcome: Progressing Goal: Dietary intake will improve Outcome: Not Progressing

## 2022-05-11 NOTE — Progress Notes (Signed)
Speech Language Pathology Daily Session Note  Patient Details  Name: Kara [name] MRN: [id number] Date of Birth: 03-Mar-1923  Today's Date: 05/11/2022 SLP Individual Time: 7371-0626 SLP Individual Time Calculation (min): 45 min  Short Term Goals: Week 1: SLP Short Term Goal 1 (Week 1): Pt will utilize speech intelligibility strategies (A BOSS) with Supervision A cues for 90% intelligibility at sentence level SLP Short Term Goal 2 (Week 1): Pt will increase orientation to place, time and reason for hospitalization with Supervision A verbal and visual cues SLP Short Term Goal 3 (Week 1): Pt will increase recall of simple, functional information with min A verbal cues for 80% accuracy SLP Short Term Goal 4 (Week 1): With mod A multimodal cues, pt will scan to midline and/or left of midline during functional tasks SLP Short Term Goal 5 (Week 1): Pt will comple simple problem solving tasks with min A verbal cues SLP Short Term Goal 6 (Week 1): Pt will ID 3 physical and 2 cognitive changes s/p CVA to increase awareness into current deficits  Skilled Therapeutic Interventions: Skilled ST treatment focused on swallowing and cognitive-linguistic goals. Pt received sleeping in bed on arrival and accompanied by her daughter. Pt roused with min verbal stimuli and benefited from intermittent cues to maintain alertness.   Swallow: Pt agreeable to PO trials of regular textures. Daughter reported pt has felt limited with food options, however this sounded more attributed to disliking hospital food vs. limitations attributed to diet texture. Daughter continues to report concern for poor PO intake because "nothing tastes good". Pt consumed regular textures with prolonged yet effective mastication, minimal oral residuals, minimal buccal stasis on L, and without overt s/sx of aspiration. Pt was able to effectively clear oral residuals with liquid rinse. No appreciable differences with dysphagia 3 textures vs. regular  textures. SLP recommends diet advancement to regular textures and thin liquids at this time. Pt/daughter pleased that this may increase snack food options in particular to hopefully improve pt's PO intake.   Cognition: SLP facilitated visual scanning through identification of cards based on color and number with mod A verbal cues to scan left of midline as well as using finger to help with visual tracking. SLP facilitated a novel card game Juel Burrow) with mod A verbal/visual cues to recall task instructions, problem solving, visual scanning, and max A verbal/visual cues for error awareness. Patient was left in bed with alarm activated and immediate needs within reach at end of session. Continue per current plan of care.      Pain Pain Assessment Pain Score: Asleep  Therapy/Group: Individual Therapy  Patty Sermons 05/11/2022, 12:54 PM

## 2022-05-11 NOTE — Progress Notes (Signed)
Nurse attempted to administer patient's medication crushed in apple sauce. Patient said she can't swallow it and spit it back out. Nurse informed patient that she will try again later.

## 2022-05-11 NOTE — Progress Notes (Signed)
Physical Therapy Session Note  Patient Details  Name: Kara [name] MRN: [id number] Date of Birth: 1922-05-27  Today's Date: 05/11/2022 PT Individual Time: 0904-1000 PT Individual Time Calculation (min): 56 min  And  PT Amount of Missed Time (min): 19 Minutes PT Missed Treatment Reason: Patient fatigue  Short Term Goals: Week 1:  PT Short Term Goal 1 (Week 1): Pt will perform bed mobility with consistent ModA and improved initiation. PT Short Term Goal 2 (Week 1): Pt will perform sit<>stand with consistent ModA PT Short Term Goal 3 (Week 1): Pt will improve sitting balance to find midline and hold sitting balance for at least 55mn with overall Min/ ModA. PT Short Term Goal 4 (Week 1): Pt will tolerate standing with MaxA for at least 30sec.   Skilled Therapeutic Interventions/Progress Updates:  Patient supine in bed on entrance to room. Patient asleep and requires time, verbal encouragement, and gentle, physical rousing to begin to wake. Pt agreeable to change clothes to begin PT session.   Removed pt's socks for skin check as well as for changing of socks. Pt's L heel appears red d/t prolonged pressure. During brief change, very small open spots noted on L external labia and at topmost gluteal cleft. NT notified.   Patient with no pain complaint at start of session.  Therapeutic Activity: Bed Mobility: Bed mobility training initiated in order to change clothing as well as to change brief and pants following urinary incontinence. Vc throughout for pt to place upper leg in hip/ knee flexion with foot flat on bed and reaching over with UE in order to turn toward contralateral side. Performed many times to each side in order to ***   Pt performed supine <> sit with ***. VC/ tc required for ***.  Neuromuscular Re-ed: NMR facilitated during session with focus on***. Pt guided in ***. NMR performed for improvements in motor control and coordination, balance, sequencing, judgement, and self  confidence/ efficacy in performing all aspects of mobility at highest level of independence.   Patient supine in bed at end of session with brakes locked, *** alarm set, and all needs within reach.   Therapy Documentation Precautions:  Precautions Precautions: Fall Precaution Comments: L hemipareisis with L neglect, intermittent pusher Restrictions Weight Bearing Restrictions: No General: PT Amount of Missed Time (min): 19 Minutes PT Missed Treatment Reason: Patient fatigue Vital Signs: Therapy Vitals Temp: 98.3 F (36.8 C) Pulse Rate: 82 Resp: 16 BP: 112/70 Patient Position (if appropriate): Lying Oxygen Therapy SpO2: 94 % O2 Device: Room Air Pain:  Pt with some mild/ moderate pain related with bed rolling that is addressed with repositioning.   Therapy/Group: Individual Therapy  JAlger SimonsPT, DPT, CSRS 05/11/2022, 4:28 PM

## 2022-05-12 DIAGNOSIS — N390 Urinary tract infection, site not specified: Secondary | ICD-10-CM

## 2022-05-12 LAB — GLUCOSE, CAPILLARY
Glucose-Capillary: 159 mg/dL — ABNORMAL HIGH (ref 70–99)
Glucose-Capillary: 161 mg/dL — ABNORMAL HIGH (ref 70–99)
Glucose-Capillary: 144 mg/dL — ABNORMAL HIGH (ref 70–99)
Glucose-Capillary: 180 mg/dL — ABNORMAL HIGH (ref 70–99)

## 2022-05-12 LAB — URINE CULTURE: Culture: >=100000 — AB

## 2022-05-12 NOTE — Progress Notes (Signed)
PROGRESS NOTE   Subjective/Complaints: She does not like taste of crushed meds or her current diet.  Daughter asks if she can spit magic mouthwash instead of swallowing it.   ROS- neg for abd pain, occ nausea , no CP or SOB, abdominal pain,  +neuropathy in feet Objective:   No results found. No results for input(s): "WBC", "HGB", "HCT", "PLT" in the last 72 hours.  Recent Labs    05/10/22 0543  NA 138  K 4.0  CL 99  CO2 26  GLUCOSE 133*  BUN 16  CREATININE 0.83  CALCIUM 8.9     Intake/Output Summary (Last 24 hours) at 05/12/2022 1512 Last data filed at 05/12/2022 1207 Gross per 24 hour  Intake 444 ml  Output 185 ml  Net 259 ml         Physical Exam: Vital Signs Blood pressure (!) 120/56, pulse 71, temperature 98.1 F (36.7 C), temperature source Oral, resp. rate 19, height '5\' 1"'$  (1.549 m), weight 56.7 kg, SpO2 96 %.  General: No acute distress Mood and affect are appropriate Heart: Regular rate and rhythm no rubs murmurs or extra sounds Tongue: thrush resolved Lungs: Clear to auscultation, breathing unlabored, no rales or wheezes Abdomen: Positive bowel sounds, soft nontender to palpation, nondistended Extremities: edema and ecchymosis LUE, no edema in lower extremities Skin: warm and dry Neurologic: Cranial nerves II through XII intact, motor strength is 5/5 in right and 0/5deltoid, bicep, tricep, grip, hip flexor, knee extensors, ankle dorsiflexor and plantar flexor Sensory exam reduced sensation to LT in LUE and LLE  Musculoskeletal: Full range of motion in all 4 extremities. No joint swelling, mild  tenderness throughout b/l UE and LE   Assessment/Plan: 1. Functional deficits which require 3+ hours per day of interdisciplinary therapy in a comprehensive inpatient rehab setting. Physiatrist is providing close team supervision and 24 hour management of active medical problems listed  below. Physiatrist and rehab team continue to assess barriers to discharge/monitor patient progress toward functional and medical goals  Care Tool:  Bathing    Body parts bathed by patient: Chest, Abdomen, Right upper leg, Face     Body parts n/a: Front perineal area, Buttocks, Right lower leg, Left lower leg, Left upper leg, Left arm, Right arm   Bathing assist Assist Level: Maximal Assistance - Patient 24 - 49%     Upper Body Dressing/Undressing Upper body dressing   What is the patient wearing?: Bra, Button up shirt    Upper body assist Assist Level: Total Assistance - Patient < 25%    Lower Body Dressing/Undressing Lower body dressing      What is the patient wearing?: Incontinence brief, Pants     Lower body assist Assist for lower body dressing: Total Assistance - Patient < 25%     Toileting Toileting Toileting Activity did not occur Landscape architect and hygiene only): N/A (no void or bm)  Toileting assist Assist for toileting: Maximal Assistance - Patient 25 - 49%     Transfers Chair/bed transfer  Transfers assist     Chair/bed transfer assist level: Total Assistance - Patient < 25%     Locomotion Ambulation   Ambulation assist  Ambulation activity did not occur: Safety/medical concerns          Walk 10 feet activity   Assist  Walk 10 feet activity did not occur: Safety/medical concerns        Walk 50 feet activity   Assist Walk 50 feet with 2 turns activity did not occur: Safety/medical concerns         Walk 150 feet activity   Assist Walk 150 feet activity did not occur: Safety/medical concerns         Walk 10 feet on uneven surface  activity   Assist Walk 10 feet on uneven surfaces activity did not occur: Safety/medical concerns         Wheelchair     Assist Is the patient using a wheelchair?: Yes Type of Wheelchair: Manual Wheelchair activity did not occur: Safety/medical concerns          Wheelchair 50 feet with 2 turns activity    Assist    Wheelchair 50 feet with 2 turns activity did not occur: Safety/medical concerns       Wheelchair 150 feet activity     Assist  Wheelchair 150 feet activity did not occur: Safety/medical concerns       Blood pressure (!) 120/56, pulse 71, temperature 98.1 F (36.7 C), temperature source Oral, resp. rate 19, height '5\' 1"'$  (1.549 m), weight 56.7 kg, SpO2 96 %.  Medical Problem List and Plan: 1. Functional deficits secondary to right MCA infarction, A2 and P2 occlusion status post TNK/status post thrombectomy             -patient may  shower             -ELOS/Goals: 14-16d Min/Mod A goals for ADLs and Mobility   Continue CIR PT/OT/SLP  -May benefit from reevaluation by SLP tomorrow regarding swallow pills whole, she doesn't  like taste of crushed meds in applesauce   2.  Antithrombotics: -DVT/anticoagulation:  SCD             -antiplatelet therapy: Eliquis 2.5 mg BID 3. Diabetic peripheral neuropathy: Discussed [facility] outpatient and daughter would like for her to try- will ask April to schedule. Lidoderm patch.  Tramadol as needed  -Continue tramadol PRN, daughter reports poor response to a nerve pain medication but does not remember name of this medication  -Daughter says medications that caused her nausea in past was cymbalta, discussed trying gabapentin, she would like to hold off unless pain worsens  4. Mood/Behavior/Sleep: Provide emotional support             -antipsychotic agents: N/A 5. Neuropsych/cognition: This patient IS capable of making decisions on her own behalf. 6. Skin/Wound Care: Routine skin checks 7. Fluids/Electrolytes/Nutrition: Routine in and outs with follow-up chemistries 8.  Atrial fibrillation/pacemaker.  continue Lopressor 12.5 mg every 12 hours.  Cardiac rate controlled  Eliquis 2.'5mg'$  BID 9.  Diabetes mellitus.  Hemoglobin A1c 6.9.  SSI. CBG (last 3)  Recent Labs    05/11/22 2100  05/12/22 0606 05/12/22 1201  GLUCAP 224* 159* 161*   12/23 well controlled overall  10.  Dysphagia.  Dysphagia #3 thin liquids.  Follow-up speech therapy -Upgraded to regular/thin 12/24 11.  GERD. continue Protonix  12.  Hypo K+ improved after po supplementation cont to monitor  -BMP tomorrow ordered    Latest Ref Rng & Units 05/10/2022    5:43 AM 05/09/2022    5:34 AM 05/06/2022    5:41 AM  BMP  Glucose 70 -  99 mg/dL 133  123  140   BUN 8 - 23 mg/dL '16  11  14   '$ Creatinine 0.44 - 1.00 mg/dL 0.83  0.75  0.88   Sodium 135 - 145 mmol/L 138  138  137   Potassium 3.5 - 5.1 mmol/L 4.0  2.9  3.5   Chloride 98 - 111 mmol/L 99  95  96   CO2 22 - 32 mmol/L '26  31  30   '$ Calcium 8.9 - 10.3 mg/dL 8.9  9.1  8.7    -Discussed with pharmacy, recommended KCL oral 24mq x2 and recheck tomorrow , appreciate assistance -K+ up to 4.0 on 12/23  13.  Thrush  -s/p diflucan 12/20  -resolved, stop magic mouthwash 14. Moderate malnutrition  -Consult dietician 15. Lower extremity edema: resolved, placed order to place SCDs at night for circulation. 16. UTI: keflex started, f/u UC  -Urine cultre with ecoli sensitive to cefazolin, continue keflex     LOS: 7 days A FACE TO FACE EVALUATION WAS PERFORMED  YJennye Boroughs12/25/2023, 3:12 PM

## 2022-05-12 NOTE — Plan of Care (Signed)
  Problem: Consults Goal: RH STROKE PATIENT EDUCATION Description: See Patient Education module for education specifics  Outcome: Not Progressing   Problem: RH BOWEL ELIMINATION Goal: RH STG MANAGE BOWEL WITH ASSISTANCE Description: STG Manage Bowel with mod I  Assistance. Outcome: Not Progressing Goal: RH STG MANAGE BOWEL W/MEDICATION W/ASSISTANCE Description: STG Manage Bowel with Medication with mod I Assistance. Outcome: Not Progressing   Problem: RH SAFETY Goal: RH STG ADHERE TO SAFETY PRECAUTIONS W/ASSISTANCE/DEVICE Description: STG Adhere to Safety Precautions With cues Assistance/Device. Outcome: Not Progressing   Problem: RH PAIN MANAGEMENT Goal: RH STG PAIN MANAGED AT OR BELOW PT'S PAIN GOAL Description: < 4 with prns Outcome: Not Progressing   Problem: RH KNOWLEDGE DEFICIT Goal: RH STG INCREASE KNOWLEDGE OF DIABETES Description: Patient and dtr will be able to manage DM with dietary modifications using educational handouts independently Outcome: Not Progressing Goal: RH STG INCREASE KNOWLEDGE OF HYPERTENSION Description: Patient and dtr will be able to manage HTN with medications and dietary modifications using educational handouts independently Outcome: Not Progressing Goal: RH STG INCREASE KNOWLEDGE OF DYSPHAGIA/FLUID INTAKE Description: Patient and dtr will be able to manage dysphagia, medications and dietary modifications using educational handouts independently Outcome: Not Progressing Goal: RH STG INCREASE KNOWLEGDE OF HYPERLIPIDEMIA Description: Patient and dtr will be able to manage HLD with medications and dietary modifications using educational handouts independently Outcome: Not Progressing Goal: RH STG INCREASE KNOWLEDGE OF STROKE PROPHYLAXIS Description: Patient and dtr will be able to manage secondary risk with medications and dietary modifications using educational handouts independently Outcome: Not Progressing   Problem: Education: Goal:  Understanding of CV disease, CV risk reduction, and recovery process will improve Outcome: Not Progressing Goal: Individualized Educational Video(s) Outcome: Not Progressing   Problem: Activity: Goal: Ability to return to baseline activity level will improve Outcome: Not Progressing   Problem: Cardiovascular: Goal: Ability to achieve and maintain adequate cardiovascular perfusion will improve Outcome: Not Progressing Goal: Vascular access site(s) Level 0-1 will be maintained Outcome: Not Progressing   Problem: Health Behavior/Discharge Planning: Goal: Ability to safely manage health-related needs after discharge will improve Outcome: Not Progressing   Problem: Education: Goal: Knowledge of disease or condition will improve Outcome: Not Progressing Goal: Knowledge of secondary prevention will improve (MUST DOCUMENT ALL) Outcome: Not Progressing Goal: Knowledge of patient specific risk factors will improve Elta [place] N/A or DELETE if not current risk factor) Outcome: Not Progressing   Problem: Ischemic Stroke/TIA Tissue Perfusion: Goal: Complications of ischemic stroke/TIA will be minimized Outcome: Not Progressing   Problem: Coping: Goal: Will verbalize positive feelings about self Outcome: Not Progressing Goal: Will identify appropriate support needs Outcome: Not Progressing   Problem: Health Behavior/Discharge Planning: Goal: Ability to manage health-related needs will improve Outcome: Not Progressing Goal: Goals will be collaboratively established with patient/family Outcome: Not Progressing   Problem: Self-Care: Goal: Ability to participate in self-care as condition permits will improve Outcome: Not Progressing Goal: Verbalization of feelings and concerns over difficulty with self-care will improve Outcome: Not Progressing Goal: Ability to communicate needs accurately will improve Outcome: Not Progressing   Problem: Nutrition: Goal: Risk of aspiration will  decrease Outcome: Not Progressing Goal: Dietary intake will improve Outcome: Not Progressing

## 2022-05-13 LAB — GLUCOSE, CAPILLARY
Glucose-Capillary: 158 mg/dL — ABNORMAL HIGH (ref 70–99)
Glucose-Capillary: 245 mg/dL — ABNORMAL HIGH (ref 70–99)
Glucose-Capillary: 226 mg/dL — ABNORMAL HIGH (ref 70–99)
Glucose-Capillary: 189 mg/dL — ABNORMAL HIGH (ref 70–99)

## 2022-05-13 NOTE — Progress Notes (Signed)
Physical Therapy Session Note  Patient Details  Name: Kara [name] MRN: [id number] Date of Birth: 01/14/1923  Today's Date: 05/13/2022 PT Individual Time: 0907-1009 PT Individual Time Calculation (min): 62 min   Short Term Goals: Week 1:  PT Short Term Goal 1 (Week 1): Pt will perform bed mobility with consistent ModA and improved initiation. PT Short Term Goal 2 (Week 1): Pt will perform sit<>stand with consistent ModA PT Short Term Goal 3 (Week 1): Pt will improve sitting balance to find midline and hold sitting balance for at least 41mn with overall Min/ ModA. PT Short Term Goal 4 (Week 1): Pt will tolerate standing with MaxA for at least 30sec.  Skilled Therapeutic Interventions/Progress Updates:    Pt received supine in bed with her friend, [name] present and pt agreeable to therapy session. Supine>sitting R EOB, HOB partially elevated and using bedrail, with mod assist for trunk upright and pt does well integrating L UE and L LE with active movement against gravity when verbally cued.  Sitting EOB, pt with persistent L lean, possible pushing with R UE, but not having total trunk LOB therefore able to provide close supervision/CGA with intermittent mod assist to return towards midline. Threaded on pants with pt using intermittent R UE support on bedrail for trunk control and max/total assist for threading on pants with pt using R UE to assist with this and limited L UE involvement.  Sit>stand EOB>R UE support on bedrail with max assist for coming to stand and balance while therapist blocking L knee for safety, but pt demos activation of quads - pulling pants up over hips total assist.   L squat pivot EOB>w/c with max assist for lifting/pivoting hips but pt does well leaning forward and to R when verbally cued - have to cue pt to let go of bedrail with R UE otherwise she holds on and prevents hips from being rotated into the chair.  Transported to gym in w/c.  Pt scanning L to look at  items in gym environment with min prompting and will initiate looking that direction on her own when she is interested in an auditory stimulus, but at rest has strong R gaze preference.  Pt reports need to use bathroom.  Transported back to her room.  L stand pivot w/c>BSC over toilet with R UE support on grab bar with mod assist of 1 for lifting and balance but pt able to perform L lateral step and turn with good L knee control during stance; transitioned R UE support therapist's shoulder because pt holding onto grabbar with R hand and not letting it go when turning. Standing with mod assist for balance due to L posterior lean and NT providing dependent LB clothing management and peri-care - continent of bowels.  R stand pivot BSC over toilet>w/c with continued mod assist of 1 for balance and facilitating weight shifting with pt demonstrating ability to step towards R with improved balance as pt able to see the seat in this direction.  Transported back to gym. Sit>stand w/c>R HHA from +2 and L UE 3 Musketeer support over therapist's shoulders with mod assist to come to standing and balance.  Gait training [id number] +2 assist (R HHA and 3 Musketeer on L) with mod assist from therapist and min assist from +2 - pt able to step L LE without assist and stabilize during stance - pt does have L lateral trunk lean throughout and benefits from facilitating weight shift - occasionally has poor L LE  foot clearance during swing with pt having minor anterior LOB rising up on her toes to recover her balance.  With min encouragement, pt agreeable to remain sitting up in w/c until next therapy session - left with needs in reach, seat belt alarm on, her friend present, and LUE supported on 1/2 lap tray.    Therapy Documentation Precautions:  Precautions Precautions: Fall Precaution Comments: L hemipareisis with L neglect, intermittent pusher Restrictions Weight Bearing Restrictions: No   Pain:  Pt is  sensitive to touch on hips and buttocks during facilitation for coming to stand therefore therapist modified technique for pain management during session.    Therapy/Group: Individual Therapy  Tawana Scale , PT, DPT, NCS, CSRS 05/13/2022, 7:52 AM

## 2022-05-13 NOTE — Progress Notes (Addendum)
Occupational Therapy Session Note  Patient Details  Name: Kara [name] MRN: [id number] Date of Birth: Oct 26, 1922  Today's Date: 05/13/2022 OT Individual Time: 1107-1203 session 1 OT Individual Time Calculation (min): 56 min  Session [id number]   Short Term Goals: Week 1:  OT Short Term Goal 1 (Week 1): Patient will sit at edge of bed with min assist x 30 seconds in preparation for level surface trasnfer OT Short Term Goal 2 (Week 1): Patient will visually locate wheelchair placed to left of midline with mod cueing prior to transfer OT Short Term Goal 3 (Week 1): Patient will trasnfer from bed to wheelchair with mod assist in preparation for completing bathing/ hygiene at sink. OT Short Term Goal 4 (Week 1): Patient will physically locate left arm on body and position in preparation for rolling or trasnfer with mod cueing and min assist OT Short Term Goal 5 (Week 1): Patient will attend to self in mirror (midline)with min prompting x 10-15 seconds when completing am ADL  Skilled Therapeutic Interventions/Progress Updates:  Session 1: Pt greeted seated in w/c, pt agreeable to OT intervention. Session focus on  functional mobility, LUE NMR, ADL transfers, BADL reeducation, dynamic standing balance and decreasing overall caregiver burden.        Pt transported to gym with total A, pt completed squat pivot to R side with MOD A+2. Pt able to static sit EOB with light CGA, worked on LUE coordination and NMR with pt instructed to use LUE to reach for cones placed on L side and transfer cones to side table on R side with an emphasis on visual scanning, LUE coordination, visual attention and dynamic sitting balance. Pt completed task with CGA for balance, pt able to grasp cones with LUE with + time and effort, cues needed to visual attend to L hand as pt with impaired proprioception, often thinking she was holding a cone but actually had dropped it.   Worked on sit>stands for LLE NMR with pt able to  stand x3 from [facility] with MODA with an emphasis on anterior weight shift and elevating trunk into standing.      Pt then reports need to void bladder. Stand pivot back to w/c to R side with MOD A +2 for safety. Pt able to stand pivot to [facility] over toilet to L side with MOD A +2 with therapist in front of pt and tech behind pt. Pt with + urine void, MAX A +2 for 3/3 toileting tasks.    Worked on LUE coordination from w/c with pt instructed to grasp wash cloth with L hand and place on green bed pad with an emphasis on scanning to L side, pt needed cues to visually attend to L hand during task as pt often would drop wash cloth or hit hand on arm rests d/t impaired proprioception, instructed pt to work on task in between sessions.   Ended session with pt seated in w/c with all needs within reach and safety belt alarm activated. Caregiver also present.    Session 2: Pt greeted asleep in supine but easily able to arouse and pt agreeable to OT intervention. Session focus on BADL reeducation, functional mobility, dynamic standing balance and decreasing overall caregiver burden.  Pt requested to shower this session. Pt completed supine>sit to R side with MINA, pt completed stand pivot to rollin shower chair to R side with MODA +2 for safety ( IV covered for shower) . Rolled pt in shower with total A. Pt completed bathing  with overall MIN A, pts biggest difficulty with maintaining midline posture but pt did well with cue of keep R elbow on arm rest of shower chair. Overall MIN A for sitting balance during bathing. Pt needed assist to wash buttock through cut out but otherwise able to access all parts even initiating using LUE with no cues. Pt using BUEs to wash hair.   Pt completed dressing from shower seat with pt needing MAX A for UB dressing with OH shirt. Pt did not recall hemitechniques,but with education pt able to pull LUE through sleeve once threaded. Donned pants with MAX A with pt assisting with threading BLEs  from sitting EOB but d/t impaired L attention/motor planning pt needed assist to fully thread pants correctly, pt stood with MOD A while therapist pulled pants to waist line.    Pt completed grooming tasks from semi reclined position in bed such as brushing her hair. Pt completed task with supervision. Pt able to initiate brushing L sided of hair with no cues needed.     Pt thinking she was at her ALF during session, pt able to problem solve correct day of week with cues provided.  Ended session with pt semi reclined in bed with all needs within reach and bed alarm activated.                    Therapy Documentation Precautions:  Precautions Precautions: Fall Precaution Comments: L hemipareisis with L neglect, intermittent pusher Restrictions Weight Bearing Restrictions: No   Pain: session 1: unrated pain reported in LLE, repositioning provided.  Session 2: pt continues to c/o pain d/t sensitive skin especially on LUE, rest breaks provided as well as distraction and education on nerve pain     Therapy/Group: Individual Therapy  Precious Haws 05/13/2022, 12:18 PM

## 2022-05-13 NOTE — Progress Notes (Signed)
PROGRESS NOTE   Subjective/Complaints:  Thinks she is at assisted living facility, her caregiver is at bedside  Discussed team conf in am   ROS- neg breathing issues or abd complaints Objective:   No results found. No results for input(s): "WBC", "HGB", "HCT", "PLT" in the last 72 hours.  No results for input(s): "NA", "K", "CL", "CO2", "GLUCOSE", "BUN", "CREATININE", "CALCIUM" in the last 72 hours.   Intake/Output Summary (Last 24 hours) at 05/13/2022 0752 Last data filed at 05/12/2022 1207 Gross per 24 hour  Intake 177 ml  Output 75 ml  Net 102 ml         Physical Exam: Vital Signs Blood pressure (!) 147/74, pulse 74, temperature 97.7 F (36.5 C), temperature source Oral, resp. rate 17, height '5\' 1"'$  (1.549 m), weight 52.5 kg, SpO2 96 %.  General: No acute distress Mood and affect are appropriate Heart: Regular rate and rhythm no rubs murmurs or extra sounds Tongue: thrush resolved Lungs: Clear to auscultation, breathing unlabored, no rales or wheezes Abdomen: Positive bowel sounds, soft nontender to palpation, nondistended Extremities: neg C/C/E Skin: warm and dry Neurologic: Cranial nerves II through XII intact, motor strength is 5/5 in right and 0/5deltoid, bicep, tricep, grip, hip flexor, knee extensors, ankle dorsiflexor and plantar flexor Sensory exam reduced sensation to LT in LUE and LLE  Musculoskeletal: Full range of motion in all 4 extremities. No joint swelling, mild  tenderness throughout b/l UE and LE   Assessment/Plan: 1. Functional deficits which require 3+ hours per day of interdisciplinary therapy in a comprehensive inpatient rehab setting. Physiatrist is providing close team supervision and 24 hour management of active medical problems listed below. Physiatrist and rehab team continue to assess barriers to discharge/monitor patient progress toward functional and medical goals  Care  Tool:  Bathing    Body parts bathed by patient: Chest, Abdomen, Right upper leg, Face     Body parts n/a: Front perineal area, Buttocks, Right lower leg, Left lower leg, Left upper leg, Left arm, Right arm   Bathing assist Assist Level: Maximal Assistance - Patient 24 - 49%     Upper Body Dressing/Undressing Upper body dressing   What is the patient wearing?: Bra, Button up shirt    Upper body assist Assist Level: Total Assistance - Patient < 25%    Lower Body Dressing/Undressing Lower body dressing      What is the patient wearing?: Incontinence brief, Pants     Lower body assist Assist for lower body dressing: Total Assistance - Patient < 25%     Toileting Toileting Toileting Activity did not occur Landscape architect and hygiene only): N/A (no void or bm)  Toileting assist Assist for toileting: Maximal Assistance - Patient 25 - 49%     Transfers Chair/bed transfer  Transfers assist     Chair/bed transfer assist level: Total Assistance - Patient < 25%     Locomotion Ambulation   Ambulation assist   Ambulation activity did not occur: Safety/medical concerns          Walk 10 feet activity   Assist  Walk 10 feet activity did not occur: Safety/medical concerns  Walk 50 feet activity   Assist Walk 50 feet with 2 turns activity did not occur: Safety/medical concerns         Walk 150 feet activity   Assist Walk 150 feet activity did not occur: Safety/medical concerns         Walk 10 feet on uneven surface  activity   Assist Walk 10 feet on uneven surfaces activity did not occur: Safety/medical concerns         Wheelchair     Assist Is the patient using a wheelchair?: Yes Type of Wheelchair: Manual Wheelchair activity did not occur: Safety/medical concerns         Wheelchair 50 feet with 2 turns activity    Assist    Wheelchair 50 feet with 2 turns activity did not occur: Safety/medical concerns        Wheelchair 150 feet activity     Assist  Wheelchair 150 feet activity did not occur: Safety/medical concerns       Blood pressure (!) 147/74, pulse 74, temperature 97.7 F (36.5 C), temperature source Oral, resp. rate 17, height '5\' 1"'$  (1.549 m), weight 52.5 kg, SpO2 96 %.  Medical Problem List and Plan: 1. Functional deficits secondary to right MCA infarction, A2 and P2 occlusion status post TNK/status post thrombectomy             -patient may  shower             -ELOS/Goals: 14-16d Min/Mod A goals for ADLs and Mobility   Continue CIR PT/OT/SLP  -May benefit from reevaluation by SLP tomorrow regarding swallow pills whole, she doesn't  like taste of crushed meds in applesauce   2.  Antithrombotics: -DVT/anticoagulation:  SCD             -antiplatelet therapy: Eliquis 2.5 mg BID 3. Diabetic peripheral neuropathy: Discussed Qutenza outpatient and daughter would like for her to try- will ask April to schedule. Lidoderm patch.  Tramadol as needed  -Continue tramadol PRN, daughter reports poor response to a nerve pain medication but does not remember name of this medication  -Daughter says medications that caused her nausea in past was cymbalta, discussed trying gabapentin, she would like to hold off unless pain worsens  4. Mood/Behavior/Sleep: Provide emotional support             -antipsychotic agents: N/A 5. Neuropsych/cognition: This patient IS capable of making decisions on her own behalf. 6. Skin/Wound Care: Routine skin checks 7. Fluids/Electrolytes/Nutrition: Routine in and outs with follow-up chemistries 8.  Atrial fibrillation/pacemaker.  continue Lopressor 12.5 mg every 12 hours.  Cardiac rate controlled  Eliquis 2.'5mg'$  BID 9.  Diabetes mellitus.  Hemoglobin A1c 6.9.  SSI. CBG (last 3)  Recent Labs    05/12/22 1705 05/12/22 2122 05/13/22 0512  GLUCAP 144* 180* 158*   12/23 well controlled overall  10.  Dysphagia.  Dysphagia #3 thin liquids.  Follow-up speech  therapy -Upgraded to regular/thin 12/24 11.  GERD. continue Protonix  12.  Hypo K+ improved after po supplementation cont to monitor  -BMP tomorrow ordered    Latest Ref Rng & Units 05/10/2022    5:43 AM 05/09/2022    5:34 AM 05/06/2022    5:41 AM  BMP  Glucose 70 - 99 mg/dL 133  123  140   BUN 8 - 23 mg/dL '16  11  14   '$ Creatinine 0.44 - 1.00 mg/dL 0.83  0.75  0.88   Sodium 135 - 145 mmol/L 138  138  137   Potassium 3.5 - 5.1 mmol/L 4.0  2.9  3.5   Chloride 98 - 111 mmol/L 99  95  96   CO2 22 - 32 mmol/L '26  31  30   '$ Calcium 8.9 - 10.3 mg/dL 8.9  9.1  8.7    -Discussed with pharmacy, recommended KCL oral 72mq x2 and recheck tomorrow , appreciate assistance -K+ up to 4.0 on 12/23  13.  Thrush  -s/p diflucan 12/20  -resolved, stop magic mouthwash 14. Moderate malnutrition  -Consult dietician 15. Lower extremity edema: resolved, placed order to place SCDs at night for circulation. 16. UTI: keflex started, f/u UC  -Urine culture with ecoli sensitive to cefazolin, continue keflex     LOS: 8 days A FACE TO FACE EVALUATION WAS PERFORMED  ACharlett Blake12/26/2023, 7:52 AM

## 2022-05-14 LAB — GLUCOSE, CAPILLARY
Glucose-Capillary: 162 mg/dL — ABNORMAL HIGH (ref 70–99)
Glucose-Capillary: 231 mg/dL — ABNORMAL HIGH (ref 70–99)
Glucose-Capillary: 171 mg/dL — ABNORMAL HIGH (ref 70–99)
Glucose-Capillary: 167 mg/dL — ABNORMAL HIGH (ref 70–99)

## 2022-05-14 NOTE — Progress Notes (Signed)
Patient ID: Kara [name], female   DOB: 1922-12-06, 88 y.o.   MRN: [id number]  Team Conference Report to Patient/Family  Team Conference discussion was reviewed with the patient and caregiver, including goals, any changes in plan of care and target discharge date.  Patient and caregiver express understanding and are in agreement.  The patient has a target discharge date of 05/22/22.  Sw spoke with patient and daughter, Kara [name] via telephone and provided conference updates. Daughter informed of the SNF recommendations. Daughter aware that Sw will reach out to facility to discuss SNF. No additional questions or concerns.   Dyanne Iha 05/14/2022, 1:45 PM

## 2022-05-14 NOTE — Progress Notes (Signed)
PROGRESS NOTE   Subjective/Complaints:  Severe left neglect, asks for me to call RN who is at bedside   ROS- neg breathing issues or abd complaints Objective:   No results found. No results for input(s): "WBC", "HGB", "HCT", "PLT" in the last 72 hours.  No results for input(s): "NA", "K", "CL", "CO2", "GLUCOSE", "BUN", "CREATININE", "CALCIUM" in the last 72 hours.   Intake/Output Summary (Last 24 hours) at 05/14/2022 0832 Last data filed at 05/13/2022 1700 Gross per 24 hour  Intake 236 ml  Output --  Net 236 ml         Physical Exam: Vital Signs Blood pressure (!) 141/62, pulse 85, temperature 98.1 F (36.7 C), resp. rate 16, height '5\' 1"'$  (1.549 m), weight 52.5 kg, SpO2 94 %.  General: No acute distress Mood and affect are appropriate Heart: Regular rate and rhythm no rubs murmurs or extra sounds Tongue: thrush resolved Lungs: Clear to auscultation, breathing unlabored, no rales or wheezes Abdomen: Positive bowel sounds, soft nontender to palpation, nondistended Extremities: neg C/C/E Skin: warm and dry Neurologic: Cranial nerves II through XII intact, motor strength is 5/5 in right and 0/5deltoid, bicep, tricep, grip, hip flexor, knee extensors, ankle dorsiflexor and plantar flexor Sensory exam reduced sensation to LT in LUE and LLE  Musculoskeletal: Full range of motion in all 4 extremities. No joint swelling, mild  tenderness throughout b/l UE and LE   Assessment/Plan: 1. Functional deficits which require 3+ hours per day of interdisciplinary therapy in a comprehensive inpatient rehab setting. Physiatrist is providing close team supervision and 24 hour management of active medical problems listed below. Physiatrist and rehab team continue to assess barriers to discharge/monitor patient progress toward functional and medical goals  Care Tool:  Bathing    Body parts bathed by patient: Right arm, Left arm,  Chest, Abdomen, Front perineal area, Right upper leg, Left upper leg, Right lower leg, Left lower leg   Body parts bathed by helper: Buttocks Body parts n/a: Front perineal area, Buttocks, Right lower leg, Left lower leg, Left upper leg, Left arm, Right arm   Bathing assist Assist Level: Minimal Assistance - Patient > 75%     Upper Body Dressing/Undressing Upper body dressing   What is the patient wearing?: Pull over shirt    Upper body assist Assist Level: Maximal Assistance - Patient 25 - 49%    Lower Body Dressing/Undressing Lower body dressing      What is the patient wearing?: Incontinence brief, Pants     Lower body assist Assist for lower body dressing: Maximal Assistance - Patient 25 - 49%     Toileting Toileting Toileting Activity did not occur (Clothing management and hygiene only): N/A (no void or bm)  Toileting assist Assist for toileting: 2 Helpers (MAX A +2)     Transfers Chair/bed transfer  Transfers assist     Chair/bed transfer assist level: Moderate Assistance - Patient 50 - 74%     Locomotion Ambulation   Ambulation assist   Ambulation activity did not occur: Safety/medical concerns          Walk 10 feet activity   Assist  Walk 10 feet activity did not  occur: Safety/medical concerns        Walk 50 feet activity   Assist Walk 50 feet with 2 turns activity did not occur: Safety/medical concerns         Walk 150 feet activity   Assist Walk 150 feet activity did not occur: Safety/medical concerns         Walk 10 feet on uneven surface  activity   Assist Walk 10 feet on uneven surfaces activity did not occur: Safety/medical concerns         Wheelchair     Assist Is the patient using a wheelchair?: Yes Type of Wheelchair: Manual Wheelchair activity did not occur: Safety/medical concerns         Wheelchair 50 feet with 2 turns activity    Assist    Wheelchair 50 feet with 2 turns activity did not  occur: Safety/medical concerns       Wheelchair 150 feet activity     Assist  Wheelchair 150 feet activity did not occur: Safety/medical concerns       Blood pressure (!) 141/62, pulse 85, temperature 98.1 F (36.7 C), resp. rate 16, height '5\' 1"'$  (1.549 m), weight 52.5 kg, SpO2 94 %.  Medical Problem List and Plan: 1. Functional deficits secondary to right MCA infarction, A2 and P2 occlusion status post TNK/status post thrombectomy             -patient may  shower             -ELOS/Goals: 14-16d Min/Mod A goals for ADLs and Mobility   Continue CIR PT/OT/SLP  -May benefit from reevaluation by SLP tomorrow regarding swallow pills whole, she doesn't  like taste of crushed meds in applesauce   2.  Antithrombotics: -DVT/anticoagulation:  SCD             -antiplatelet therapy: Eliquis 2.5 mg BID 3. Diabetic peripheral neuropathy: Discussed [facility] outpatient and daughter would like for her to try- will ask April to schedule. Lidoderm patch.  Tramadol as needed  -Continue tramadol PRN, daughter reports poor response to a nerve pain medication but does not remember name of this medication  -Daughter says medications that caused her nausea in past was cymbalta, discussed trying gabapentin, she would like to hold off unless pain worsens  4. Mood/Behavior/Sleep: Provide emotional support             -antipsychotic agents: N/A 5. Neuropsych/cognition: This patient IS capable of making decisions on her own behalf. 6. Skin/Wound Care: Routine skin checks 7. Fluids/Electrolytes/Nutrition: Routine in and outs with follow-up chemistries 8.  Atrial fibrillation/pacemaker.  continue Lopressor 12.5 mg every 12 hours.  Cardiac rate controlled  Eliquis 2.'5mg'$  BID 9.  Diabetes mellitus.  Hemoglobin A1c 6.9.  SSI. CBG (last 3)  Recent Labs    05/13/22 1632 05/13/22 2122 05/14/22 0611  GLUCAP 226* 189* 162*   12/26 fair control  10.  Dysphagia.  Dysphagia #3 thin liquids.  Follow-up speech  therapy -Upgraded to regular/thin 12/24 11.  GERD. continue Protonix  12.  Hypo K+ improved after po supplementation cont to monitor  -BMP tomorrow ordered    Latest Ref Rng & Units 05/10/2022    5:43 AM 05/09/2022    5:34 AM 05/06/2022    5:41 AM  BMP  Glucose 70 - 99 mg/dL 133  123  140   BUN 8 - 23 mg/dL '16  11  14   '$ Creatinine 0.44 - 1.00 mg/dL 0.83  0.75  0.88   Sodium  135 - 145 mmol/L 138  138  137   Potassium 3.5 - 5.1 mmol/L 4.0  2.9  3.5   Chloride 98 - 111 mmol/L 99  95  96   CO2 22 - 32 mmol/L '26  31  30   '$ Calcium 8.9 - 10.3 mg/dL 8.9  9.1  8.7    -Discussed with pharmacy, recommended KCL oral 69mq x2 and recheck tomorrow , appreciate assistance -K+ up to 4.0 on 12/23  13.  Thrush  -s/p diflucan 12/20  -resolved, stop magic mouthwash 14. Moderate malnutrition  -Consult dietician 15. Lower extremity edema: resolved, placed order to place SCDs at night for circulation. 16. UTI: keflex started, f/u UC  -Urine culture with ecoli sensitive to cefazolin, continue keflex     LOS: 9 days A FACE TO FACE EVALUATION WAS PERFORMED  ACharlett Blake12/27/2023, 8:32 AM

## 2022-05-14 NOTE — Progress Notes (Signed)
Speech Language Pathology Weekly Progress and Session Note  Patient Details  Name: Kara [name] MRN: [id number] Date of Birth: [birth year]  Beginning of progress report period: May 06, 2022 End of progress report period: May 14, 2022  Today's Date: 05/14/2022 SLP Individual Time: 0920-1000 SLP Individual Time Calculation (min): 40 min  Short Term Goals: Week 1: SLP Short Term Goal 1 (Week 1): Pt will utilize speech intelligibility strategies (A BOSS) with Supervision A cues for 90% intelligibility at sentence level SLP Short Term Goal 1 - Progress (Week 1): Met SLP Short Term Goal 2 (Week 1): Pt will increase orientation to place, time and reason for hospitalization with Supervision A verbal and visual cues SLP Short Term Goal 2 - Progress (Week 1): Not met SLP Short Term Goal 3 (Week 1): Pt will increase recall of simple, functional information with min A verbal cues for 80% accuracy SLP Short Term Goal 3 - Progress (Week 1): Not met SLP Short Term Goal 4 (Week 1): With mod A multimodal cues, pt will scan to midline and/or left of midline during functional tasks SLP Short Term Goal 4 - Progress (Week 1): Met SLP Short Term Goal 5 (Week 1): Pt will comple simple problem solving tasks with min A verbal cues SLP Short Term Goal 5 - Progress (Week 1): Not met SLP Short Term Goal 6 (Week 1): Pt will ID 3 physical and 2 cognitive changes s/p CVA to increase awareness into current deficits SLP Short Term Goal 6 - Progress (Week 1): Not met    New Short Term Goals: Week 2: SLP Short Term Goal 1 (Week 2): STG's = LTG's due to ELOS  Weekly Progress Updates: Pt has demonstrated slow progress this past reporting period only meeting 2 out of 6 short-term goals. Appears to have been limited by severity of deficits and fatigue/lethargy. Overall, pt benefits from Mod to Max A to complete cognitive tasks, though speech intelligibility has improved and appears to be back to baseline per pt and  caregiver report. Continue to recommend ST services during CIR admission, as well as f/u ST and 24/7 supervision and assistance at time of d/c.   Intensity: Minumum of 1-2 x/day, 30 to 90 minutes Frequency: 3 to 5 out of 7 days Duration/Length of Stay: 14-16 days Treatment/Interventions: Cognitive remediation/compensation;Dysphagia/aspiration precaution training;Internal/external aids;Therapeutic Activities;Therapeutic Exercise;Patient/family education;Functional tasks;Cueing hierarchy   Daily Session  Skilled Therapeutic Interventions:     Pt seen this date for skilled ST intervention targeting cognitive goals outlined in above. Pt greeted awake/alert and bed, sitting upright. Agreeable to intervention at bedside. Caregiver present. Pleasant and participatory throughout.  Upon inquiry, pt oriented to place, situation, month, and year at the Mod I level; date with Min-Mod A. Mod A for visual scanning to L field when reading calendar and rolling to L during peri-care/toileting. Speech was 100% intelligible at the conversational level without cues. Verbalized 3 physical deficits given Max A and 2 cognitive deficits given Total A. Pt effectively and spontaneously communicated need to use the bathroom on two different occasions and was continent of bowel and bladder (see flowsheet) with use of bedpan. Incidentally, pt noted to produce immediate cough on 2 out of 2 trials of thin liquid via straw. No instances concerning for aspiration with thin liquids via cup. Provided education to RN, pt, and caregiver re: avoiding straws at this time given decreased ambulatory status; all parties voiced agreement and understanding.  Pt left in room and in bed with all safety measures  activated and call bell within reach. Caregiver remained at bedside. Continue per current ST POC.  Pain No reports of pain; NAD  Therapy/Group: Individual Therapy  Kara [name] 05/14/2022, 1:08 PM

## 2022-05-14 NOTE — Progress Notes (Signed)
Occupational Therapy Weekly Progress Note  Patient Details  Name: Kara [name] MRN: [id number] Date of Birth: 03/18/1923  Beginning of progress report period: May 06, 2022 End of progress report period: May 14, 2022  Today's Date: 05/14/2022 OT Individual Time: 8416-6063 OT Individual Time Calculation (min): 45 min    Patient has met 5 of 5 short term goals.  Pt has made good progress this week and is able to tolerate therapy more. She also is less hypersensitive so it has been easier to facilitate her movements.  She continues to have a severe visual field cut of 90 degrees with L neglect, but when cued to turn her head as far as possible to her L pt can then see herself in the mirror, find objects and people in her L field. Also has increased awareness of L arm and follows cues to use her arm in bed mobility and transfers more efficiently. Progressing with LUE AROM.    Patient continues to demonstrate the following deficits: muscle weakness, decreased cardiorespiratoy endurance, abnormal tone, unbalanced muscle activation, and motor apraxia, decreased visual perceptual skills, decreased visual motor skills, and hemianopsia, left side neglect, decreased awareness, decreased problem solving, decreased safety awareness, decreased memory, and delayed processing, and decreased sitting balance, decreased standing balance, decreased postural control, hemiplegia, decreased balance strategies, and hypersensitivity to touch  and therefore will continue to benefit from skilled OT intervention to enhance overall performance with BADL and Reduce care partner burden.  Patient progressing toward long term goals..  Continue plan of care.  OT Short Term Goals Week 1:  OT Short Term Goal 1 (Week 1): Patient will sit at edge of bed with min assist x 30 seconds in preparation for level surface trasnfer OT Short Term Goal 1 - Progress (Week 1): Met OT Short Term Goal 2 (Week 1): Patient will visually  locate wheelchair placed to left of midline with mod cueing prior to transfer OT Short Term Goal 2 - Progress (Week 1): Met OT Short Term Goal 3 (Week 1): Patient will trasnfer from bed to wheelchair with mod assist in preparation for completing bathing/ hygiene at sink. OT Short Term Goal 3 - Progress (Week 1): Met OT Short Term Goal 4 (Week 1): Patient will physically locate left arm on body and position in preparation for rolling or trasnfer with mod cueing and min assist OT Short Term Goal 4 - Progress (Week 1): Met OT Short Term Goal 5 (Week 1): Patient will attend to self in mirror (midline)with min prompting x 10-15 seconds when completing am ADL OT Short Term Goal 5 - Progress (Week 1): Met Week 2:  OT Short Term Goal 1 (Week 2): Pt will don pull over shirt with mod A. OT Short Term Goal 2 (Week 2): Pt will don pants over feet with mod A. OT Short Term Goal 3 (Week 2): Pt will be able to hold static stand with min A so caregiver can adjust clothing over hips. OT Short Term Goal 4 (Week 2): Pt will complete toilet transfers with min A.  Skilled Therapeutic Interventions/Progress Updates:    Pt received in bed and agreeable to therapy. Caregiver Alden Benjamin present. Pt did well with cues for rolling to R and actively bringing L arm across body. Moved to sit with min A and then only min A to light CGA to maintain EOB sitting 75% of the time, the other 25% of the time, pt would lose her focus and fall to L with mod A  to hold balance. Pt worked on LB dressing from [place] with max A, stood to RW 3x with mod A, held balance with mod -max A.  Tried a stand pivot to wc but she had difficulty shifting weight on her feet so did a squat pivot instead with mod A.   Pt sat at sink to complete oral care, washing face, brushing hair.  Set up with lap tray, belt alarm and all needs met.  Good participation today.   Therapy Documentation Precautions:  Precautions Precautions: Fall Precaution Comments: L  hemipareisis with L neglect, intermittent pusher Restrictions Weight Bearing Restrictions: No  Pain: no c/o pain    ADL: ADL Eating: Supervision/safety Grooming: Minimal cueing, Minimal assistance Where Assessed-Grooming: Sitting at sink Upper Body Bathing: Moderate assistance Where Assessed-Upper Body Bathing: Sitting at sink Lower Body Bathing: Moderate assistance Where Assessed-Lower Body Bathing: Sitting at sink Upper Body Dressing: Maximal assistance Where Assessed-Upper Body Dressing: Edge of bed Lower Body Dressing: Maximal assistance Where Assessed-Lower Body Dressing: Edge of bed Toileting: Dependent Where Assessed-Toileting: Glass blower/designer: Maximal Print production planner Method: Stand pivot, Squat pivot Tub/Shower Transfer: Unable to assess Tub/Shower Transfer Method: Unable to assess Social research officer, government: Unable to assess [employer] Method: Unable to assess ADL Comments: Patient with significant pain with all movement - decreased postural control, pushing to weaker left side   Therapy/Group: Individual Therapy  Barnell Shieh 05/14/2022, 12:48 PM

## 2022-05-14 NOTE — Progress Notes (Signed)
Physical Therapy Weekly Progress Note  Patient Details  Name: Kara [name] MRN: [id number] Date of Birth: 04-Apr-1923  Beginning of progress report period: May 06, 2022 End of progress report period: May 14, 2022  Today's Date: 05/14/2022 PT Individual Time: 1540-1640 PT Individual Time Calculation (min): 60 min   Patient has met 2 of 4 short term goals. Kara [name] is making excellent progress with therapy demonstrating increasing independence with functional mobility; however, continues to be primarily limited due to significant L inattention, impaired midline orientation with possible pusher tendencies, and impaired L hemibody sensation. She is performing supine<>sit with mod assist using bed features, sit<>stands with mod assist, and stand vs squat pivot transfers with mod/max assist. She has initiated gait training either using B UE support (R HHA from +2 and L UE over therapist's shoulders) vs using RW with mod assist for balance and to facilitate weight shifting onto R stance limb due to persistent L lean. Pt will benefit from continued CIR level skilled physical therapy to further advance her independence with functional mobility prior to D/C.   Patient continues to demonstrate the following deficits muscle weakness, decreased cardiorespiratoy endurance, impaired timing and sequencing, abnormal tone, unbalanced muscle activation, motor apraxia, decreased coordination, and decreased motor planning, decreased midline orientation and decreased attention to left, decreased initiation, decreased attention, decreased awareness, decreased problem solving, decreased safety awareness, decreased memory, and delayed processing, and decreased sitting balance, decreased standing balance, decreased postural control, and decreased balance strategies and therefore will continue to benefit from skilled PT intervention to increase functional independence with mobility.  Patient progressing toward long  term goals..  Continue plan of care.  PT Short Term Goals Week 2:  PT Short Term Goal 1 (Week 2): Pt will perform bed mobility with min assist using bed features if needed PT Short Term Goal 2 (Week 2): Pt will perform sit<>stands using LRAD with min assist PT Short Term Goal 3 (Week 2): Pt will perform bed<>chair transfers using LRAD with consistent mod assist PT Short Term Goal 4 (Week 2): Pt will ambulate at least 51f using LRAD with mod assist of 1 PT Short Term Goal 5 (Week 2): Pt will maintain static standing balance using LRAD for 1 minute with no more than min assist Week 3:     Skilled Therapeutic Interventions/Progress Updates:  Ambulation/gait training;Community reintegration;DME/adaptive equipment instruction;Neuromuscular re-education;Psychosocial support;Stair training;UE/LE Strength taining/ROM;Wheelchair propulsion/positioning;UE/LE Coordination activities;Therapeutic Activities;Skin care/wound management;Pain management;Functional electrical stimulation;Discharge planning;Balance/vestibular training;Cognitive remediation/compensation;Disease management/prevention;Functional mobility training;Patient/family education;Splinting/orthotics;Therapeutic Exercise;Visual/perceptual remediation/compensation   Pt received sitting in w/c with her hips sliding forward out of the seat - notified NT for assistance to ensure pt safety. Pt continues to have L lateral trunk lean in seat - requires +2 max assist to maintain trunk control and scoot hips back in seat safely. Pt agreeable to therapy session. Pt able to recall having walked 884fyesterday - pt suddenly starts to become tearful and crying stating "I feel so helpless."Therapist provides emotional support and education to pt on her progress thus far during CIR. Pt's mood improves during session.  Transported to/from gym in w/c for time management and energy conservation.  Sit>stand L UE support around therapist's shoulders and R HHA from  +2 assist with mod assist for lifting to stand and balance with pt continuing to have gradually worsening L lean due to impaired midline orientation.  Gait training 11221fsing R HHA from +2 and L UE support over therapist's shoulders - requires min assist from +2 assist and  min progressed to heavier mod assist from therapist for balance due to gradually worsening L lateral lean with fatigue - requires manual facilitation to weight shift onto R LE during stance to allow pt to advance L LE during swing - pt able to advance L LE and stabilize during stance without assist, cuing for improved L LE foot clearance and step length as pt occasionally has poor clearance catching her toes (but appears related to lack of adequate R weight shift).  Therapist set-up pt with RW.   Gait training 5f x2 using RW (donned L hand splint on RW during 2nd trial due to pt's hand repeatedly slipping off walker grip) with +2 assist providing mirror feedback throughout to improve midline orientation - heavy mod assist of 1 for AD management and balance due to consistent L lean/pushing (pt sometimes feels she is being pushed over when facilitated into midline therefore does better initiating the weight shift herself) - pt continues to be able to advance and manage L LE gait mechanics without assist - during 2nd walk pt with improved ability to achieve midline when being provided consistent verbal cuing to step L LE.  Transported back to her room and pt requesting to return to bed.  R stand pivot w/c>EOB with heavy min assist! With pt demoing improved motor planning in this direction and using R UE support on bedrail.  Sit>supine via reverse logroll technique with mod assist for B LE management into the bed. Pt left supine in bed with HOB elevated, needs in reach and reviewed how to use soft call button ensuring it was located on pt's R side and bed alarm on.  Pt continues to have R gaze preference but when intrigued by  sound/person on L will automatically scan to that side - pt also automatically integrates L hand to point towards items in gym and make gestures when talking.   Therapy Documentation Precautions:  Precautions Precautions: Fall Precaution Comments: L hemipareisis with L neglect, intermittent pusher Restrictions Weight Bearing Restrictions: No   Pain:  Starts to report low back pain during session while sitting unsupported on EOM - provided repositioning and movement for pain management.   Therapy/Group: Individual Therapy  CTawana Scale, PT, DPT, NCS, CSRS 05/14/2022, 3:15 PM

## 2022-05-14 NOTE — Progress Notes (Signed)
Occupational Therapy Session Note  Patient Details  Name: Kara [name] MRN: [id number] Date of Birth: 04-17-1923  Today's Date: 05/14/2022 OT Individual Time: 7253-6644 OT Individual Time Calculation (min): 42 min    Short Term Goals: Week 1:  OT Short Term Goal 1 (Week 1): Patient will sit at edge of bed with min assist x 30 seconds in preparation for level surface trasnfer OT Short Term Goal 1 - Progress (Week 1): Met OT Short Term Goal 2 (Week 1): Patient will visually locate wheelchair placed to left of midline with mod cueing prior to transfer OT Short Term Goal 2 - Progress (Week 1): Met OT Short Term Goal 3 (Week 1): Patient will trasnfer from bed to wheelchair with mod assist in preparation for completing bathing/ hygiene at sink. OT Short Term Goal 3 - Progress (Week 1): Met OT Short Term Goal 4 (Week 1): Patient will physically locate left arm on body and position in preparation for rolling or trasnfer with mod cueing and min assist OT Short Term Goal 4 - Progress (Week 1): Met OT Short Term Goal 5 (Week 1): Patient will attend to self in mirror (midline)with min prompting x 10-15 seconds when completing am ADL OT Short Term Goal 5 - Progress (Week 1): Met Week 2:  OT Short Term Goal 1 (Week 2): Pt will don pull over shirt with mod A. OT Short Term Goal 2 (Week 2): Pt will don pants over feet with mod A. OT Short Term Goal 3 (Week 2): Pt will be able to hold static stand with min A so caregiver can adjust clothing over hips. OT Short Term Goal 4 (Week 2): Pt will complete toilet transfers with min A.  Skilled Therapeutic Interventions/Progress Updates:  Pt greeted seated in w/c, pt agreeable to OT intervention. Session focus on LUE NMR, functional mobility, dynamic standing balance and decreasing overall caregiver burden.                     Total A transport to gym in w/c for time mgmt. Pt able to complete sit>stands with Rw with MODA with total A to place LUE on handle of Rw.  In standing, pt presents with strong L lean needing physical assist to correct, even with mirror pt continues to lean left. Provided environmental cue and had pt reach with RUE to mirror to remove squigz with little improvement noted. Pt also needs cues to fully extend LLE in standing.   From sitting worked on target reaching with LUE to reach for squigz on mirror, pt with impaired motor planning having difficulty problem solving how to grasp squigz. Pt was able to hold ball with BUEs and complete elbow flexion/extension with pt passing ball back and forth with therapist. Pt needed assist to position LUE on ball but able to complete task once LUE was set- up.   Pt continues to present with heavy L neglect therefore had pt state 3 things she saw in the hallway on her back to the room, pt easily distracted by any extraneous stimuli in the hallway d/t impaired attention with pt only able to state 1/3 items in hallway.   Ended session with pt seated in w/c with all needs within reach and safety belt alarm activated.                    Therapy Documentation Precautions:  Precautions Precautions: Fall Precaution Comments: L hemipareisis with L neglect, intermittent pusher Restrictions Weight Bearing Restrictions:  No  Pain: unrated pain reported in LUE when any unwanted stimulus touches LUE, education provided on always being mindful of LUEs location d/t impaired proprioception of LUE with repositioning and decreased AROM provided.    Therapy/Group: Individual Therapy  Precious Haws 05/14/2022, 3:49 PM

## 2022-05-14 NOTE — Progress Notes (Addendum)
Patient ID: Kara [name], female   DOB: 03-14-1923, 88 y.o.   MRN: [id number]  VM left for social worker/AD to discuss transfer from [facility]F to SNF. Left detailed voicemail with Geralyn Flash [phone]  3:04 PM: SW received follow up from AD. Facility will have bed ready for patient on skilled side on 05/22/2021.

## 2022-05-15 LAB — GLUCOSE, CAPILLARY
Glucose-Capillary: 150 mg/dL — ABNORMAL HIGH (ref 70–99)
Glucose-Capillary: 159 mg/dL — ABNORMAL HIGH (ref 70–99)
Glucose-Capillary: 222 mg/dL — ABNORMAL HIGH (ref 70–99)
Glucose-Capillary: 205 mg/dL — ABNORMAL HIGH (ref 70–99)

## 2022-05-15 NOTE — Progress Notes (Signed)
Physical Therapy Session Note  Patient Details  Name: Kara [name] MRN: [id number] Date of Birth: 1923-04-13  Today's Date: 05/15/2022 PT Individual Time: 1330-1430 PT Individual Time Calculation (min): 60 min   Short Term Goals: Week 2:  PT Short Term Goal 1 (Week 2): Pt will perform bed mobility with min assist using bed features if needed PT Short Term Goal 2 (Week 2): Pt will perform sit<>stands using LRAD with min assist PT Short Term Goal 3 (Week 2): Pt will perform bed<>chair transfers using LRAD with consistent mod assist PT Short Term Goal 4 (Week 2): Pt will ambulate at least 37f using LRAD with mod assist of 1 PT Short Term Goal 5 (Week 2): Pt will maintain static standing balance using LRAD for 1 minute with no more than min assist  Skilled Therapeutic Interventions/Progress Updates:    Pt seated in w/c on arrival and agreeable to therapy. Pt reports LBP and L hip pain in standing, RN made aware. Rest and positioning as needed. Pt transported to therapy gym for time management and energy conservation. Attempted gait training with mirror for visual feedback for midline orientation, but pt was limited by hip pain and unable to offload L side even with multimodal cueing to reduce pain. Pt did ambulate ~6 ft with RW and mod A, but was extremely limited by pain. Transitioned to attempting Sit to stand with RW and visual cue. Pt did well with therapist positioned on R side and cue to lean on therapist, but was still limited by pain. Pt participated in reaching activity with LUE, reaching L and anterior across midline for weight shift onto R side and just to L of midline for improved gaze and attention to L side. Pt returned to room and performed max A squat pivot to bed, requiring mod A for BLE management sit>supine. Pt remained in bed and was left with all needs in reach and alarm active.   Therapy Documentation Precautions:  Precautions Precautions: Fall Precaution Comments: L  hemipareisis with L neglect, intermittent pusher Restrictions Weight Bearing Restrictions: No General:       Therapy/Group: Individual Therapy  OMickel Fuchs12/28/2023, 3:51 PM

## 2022-05-15 NOTE — Progress Notes (Addendum)
Speech Language Pathology Daily Session Note  Patient Details  Name: Kara [name] MRN: [id number] Date of Birth: [birth year]  Today's Date: 05/15/2022 SLP Individual Time: 0815-0900 SLP Individual Time Calculation (min): 45 min  Short Term Goals: Week 2: SLP Short Term Goal 1 (Week 2): STG's = LTG's due to ELOS  Skilled Therapeutic Interventions: Pt seen this date for skilled ST intervention targeting swallowing and cognitive goals outlined in care plan. Pt greeted awake/alert and lying semi-reclined in bed. Caregiver present. Agreeable to intervention, and participatory. Upon pt request, SLP assisted pt with toileting at bed level with use of bed pan and verbal cues for log rolling to L (continent of bowel and bladder). Today's session with emphasis on cognitive skills and swallow function.   With consumption of thin liquid via cup, no s/sx concerning for aspiration appreciated with set-up assistance. Pt independently reports some oral residuals post-swallow along lateral sulci; therefore, SLP provided new [place] and verbal education + demonstration re: use. Given these reports, will plan to provide skilled observation of regular diet textures next session to assess tolerance since upgrade.   Re: cognition: pt oriented x 4 with supervision level cueing. Recalled information from last session with overall Min to Min-Mod A verbal cues (current physical deficits s/p CVA and recommendation for not using straws with liquid consumption; Total A for verbalizing cognitive deficits). SLP facilitated visual scanning and basic + functional problem-solving task by providing facticious medication label and Mod A verbal and visual cues for scanning to L and locating requested information (MD name, pharmacy name, name of medication). Speech continues to be fully intelligible. Intermittent instances of anomia noted though pt able to compensate well and generate name of intended item ~90% of the time when given  extra processing time; appears to be more age-related vs aphasia.   Pt left in room and upright in bed with all safety measures activated and call bell within reach. MD arrived for morning rounds and caregiver remained at bedside. Continue per current ST POC.   Pain No pain reported; NAD  Therapy/Group: Individual Therapy  Mlissa Tamayo A Jaslen Adcox 05/15/2022, 1:52 PM

## 2022-05-15 NOTE — Progress Notes (Signed)
Physical Therapy Session Note  Patient Details  Name: Kara [name] MRN: [id number] Date of Birth: Oct 17, 1922  Today's Date: 05/15/2022 PT Individual Time: 1006-1103 PT Individual Time Calculation (min): 57 min   Short Term Goals: Week 2:  PT Short Term Goal 1 (Week 2): Pt will perform bed mobility with min assist using bed features if needed PT Short Term Goal 2 (Week 2): Pt will perform sit<>stands using LRAD with min assist PT Short Term Goal 3 (Week 2): Pt will perform bed<>chair transfers using LRAD with consistent mod assist PT Short Term Goal 4 (Week 2): Pt will ambulate at least 16f using LRAD with mod assist of 1 PT Short Term Goal 5 (Week 2): Pt will maintain static standing balance using LRAD for 1 minute with no more than min assist  Skilled Therapeutic Interventions/Progress Updates:    Pt received supine in bed with her friend, [name] present and pt agreeable to therapy session. Supine>sitting R EOB, HOB partially elevated and using R UE support on bedrail, with MIN assist! For trunk control - requires max cuing for L LE movements but pt able to perform without assist.   Sitting EOB with frequent R UE support on bedrial for trunk control with close supervision due to persistent L lean (but no total LOB) and only 2x min assist for balance while therapist threaded on pants, donned shoes, and shirt with total assist - pt able to move L UE and LE to place in clothing with verbal/visual cuing to locate item on pt's L side - pt has active movement in L UE against gravity. Nurse present for medication administration - donned lidocaine patch due to R low back pain.   Pt continues to have R gaze preference but responds well to verbal cuing to look L with good L head rotation and visual scanning.  Sit>stand EOB>R UE support on bedrail with mod assist for lifting to stand and maintaining balance due to strong L lean during total assist LB clothing management.  L stand pivot EOB>w/c with  mod assist for lifting to stand and maintaining balance with step-by-step cuing for sequencing and stepping L LE and moving R hand from bedrail to therapist so pt could turn hips fully towards w/c.   Transported to/from gym in w/c for time management and energy conservation.  Sit>stand w/c>RW with L hand orthosis and light mod/heavy min assist for rising to stand and maintaining balance due to L lean although significantly improving.  Gait training [id number] (lying rest break between due to fatigue) using RW with min progressing to mod assist of 1 and +2 assist providing mirror feedback - pt continues to have L lateral trunk lean that worsens with fatigue requiring verbal and visual cuing to improve, but progressed to manual facilitation for R weight shifting and maintaining balance - verbal cuing for increased L LE step length to promote longer R weight shift - pt continues to control L LE gait mechanics without physical assist.  Sit<>supine on mat with min assist!! For trunk control and verbal cuing for B LE management without having bed features.   Pt would likely benefit from a dumped w/c or the [place] TIS w/c to ensure her safety when sitting up between sessions, but [name] not available at this time.  Transported back to her room and pt left seated in w/c with needs in reach, seat belt alarm on, L UE supported on 1/2 lap tray, and friend present.   Therapy Documentation Precautions:  Precautions Precautions: Fall Precaution Comments: L hemipareisis with L neglect, intermittent pusher Restrictions Weight Bearing Restrictions: No   Pain:  Reports R low back pain - nurse notified for lidocaine patch and donned at beginning of session - anticipate this pain is associated with pt having persistent L trunk lean with pelvic tilt.   Therapy/Group: Individual Therapy  Tawana Scale , PT, DPT, NCS, CSRS 05/15/2022, 7:45 AM

## 2022-05-15 NOTE — Patient Care Conference (Cosign Needed)
Inpatient RehabilitationTeam Conference and Plan of Care Update Date: 05/14/22   Time: 10:29 AM    Patient Name: Kara [name]      Medical Record Number: [id number]  Date of Birth: [birth year] Sex: Female         Room/Bed: 4W21C/4W21C-01 Payor Info: Payor: MEDICARE / Plan: MEDICARE PART A AND B / Product Type: *No Product type* /    Admit Date/Time:  05/05/2022  4:04 PM  Primary Diagnosis:  Right middle cerebral artery stroke [facility])  Hospital Problems: Principal Problem:   Right middle cerebral artery stroke [facility])    Expected Discharge Date: Expected Discharge Date: 05/22/22  Team Members Present: Physician leading conference: Dr. Alysia Penna Social Worker Present: Erlene Quan, BSW Nurse Present: Dorien Chihuahua, RN PT Present: Page Spiro, PT OT Present: Meriel Pica, OT SLP Present: Charolett Bumpers, SLP PPS Coordinator present : Gunnar Fusi, SLP     Current Status/Progress Goal Weekly Team Focus  Bowel/Bladder   continent of b/b with some occasional nightime incontinence.   Continue to remain continent with no incontinent episodes   Assist with toileting as needed    Swallow/Nutrition/ Hydration   regular and thin, to increase intake   Supervision A  increasing intake and oral awareness    ADL's   MINA for bathing from shower ( utilized roll in shower chair 12/26 d/t impaired balance), MAX A for dressing d/t impaired siting balance and L visual attenton, able to complete stand pivots to toilet with MOD A+2 for safety on 12/26, MAX A for toileting tasks.LUE improving greatly grossly 3-/5   Mod/min assist   sitting balance, LUE NMR, transfer training, L attention, visual scanning    Mobility   supine<>sit mod/max assist, sit<>stand and squat vs stand pivot transfers mod/max assist, gait training up to 52f with +2 for B UE support and mod assist from therapist, pt demoing improving activity tolerance sitting up in w/c between therapy sessions   min  assist overall at limited ambulatory level otherwise CGA using w/c  L hemibody NMR, midline orientation, sitting balance, standing balance and tolerance, L attention, transfer training, gait training,    Communication                Safety/Cognition/ Behavioral Observations  mod-max A   Min A - Sup A simple cog   orientation, basic problem solving, recall, intellectual awareness and left scanning    Pain   No c/o pain   Remain pain free   Assess Qshift and prn    Skin   Skin intact, Bruising on arms. Groin and bilateral heels with redness, Foams applied to heels, Balmax cream to sacrum/groin and sacral foam on top.   Maintain skin integrity, prevent skin deterioration  Assess Qshift and prn      [facility] with [facility] neglect and hemianopsia post Right MCA CVA although actively using left UE more. UTI treated.  Patient on target to meet rehab goals: Currently needs mod - max assist for ADLs, Mod assist for bed mobility, sit - stand and stand pivot transfers. Able to ambulate up to 80' without knees buckling and tolerating sitting in the chair.  Cognitively needs mod - max assist. Continue to work on intellectual awareness, problem solving and recall.   *See Care Plan and progress notes for long and short-term goals.   Revisions to Treatment Plan:  Upgraded to thin liquids/Regular diet   Teaching Needs: Safety, medications, transfers,  toileting, etc.  Current Barriers to Discharge: Decreased caregiver support  Possible Resolutions to Barriers: Family education 24/7 caregiver/SNF placement     Medical Summary Current Status: pt confused , difficulty swallowing pills, severe Left neglect, some use of Left upper ext  Barriers to Discharge: Medical stability   Possible Resolutions to [employer] Focus: work on attention to Left, cont rx for UTI, consider SNF   Continued Need for Acute  Rehabilitation Level of Care: The patient requires daily medical management by a physician with specialized training in physical medicine and rehabilitation for the following reasons: Direction of a multidisciplinary physical rehabilitation program to maximize functional independence : Yes Medical management of patient stability for increased activity during participation in an intensive rehabilitation regime.: Yes Analysis of laboratory values and/or radiology reports with any subsequent need for medication adjustment and/or medical intervention. : Yes   I attest that I was present, lead the team conference, and concur with the assessment and plan of the team.   Dorien Chihuahua B 05/15/2022, 8:47 AM

## 2022-05-15 NOTE — Progress Notes (Signed)
Patient ID: Kara [name], female   DOB: 08-Dec-1922, 88 y.o.   MRN: [id number]  Sw spoke with patient daughter, Delrae Rend. Daughter concerned that patient would meet goals and be unable to transfer to SNF. Sw informed daughter that with current level patient will have goals and additional things to work on at [facility], so patient is appropriate for placement. Daughter informed that facility will be ready for her at discharge. Facility will evaluate, set new goals and a discharge date that is determined by her progress at the facility. Patient have a $0 co pay days 1-20. Days 21-100 patient and daughter can anticipate a 20% copayment. Patient has a secondary of [employer]. Daughter instructed to discuss details with [facility] SNF business office. No additional questions or concerns.

## 2022-05-15 NOTE — Progress Notes (Signed)
PROGRESS NOTE   Subjective/Complaints:  Severe left neglect, pt can be cued verbally to attend to left visual field   ROS- neg breathing issues or abd complaints Objective:   No results found. No results for input(s): "WBC", "HGB", "HCT", "PLT" in the last 72 hours.  No results for input(s): "NA", "K", "CL", "CO2", "GLUCOSE", "BUN", "CREATININE", "CALCIUM" in the last 72 hours.   Intake/Output Summary (Last 24 hours) at 05/15/2022 0829 Last data filed at 05/15/2022 0600 Gross per 24 hour  Intake 480 ml  Output --  Net 480 ml         Physical Exam: Vital Signs Blood pressure (!) 130/57, pulse 85, temperature 98 F (36.7 C), resp. rate 16, height '5\' 1"'$  (1.549 m), weight 52.5 kg, SpO2 94 %.  General: No acute distress Mood and affect are appropriate Heart: Regular rate and rhythm no rubs murmurs or extra sounds Tongue: thrush resolved Lungs: Clear to auscultation, breathing unlabored, no rales or wheezes Abdomen: Positive bowel sounds, soft nontender to palpation, nondistended Extremities: neg C/C/E Skin: warm and dry Neurologic: Cranial nerves II through XII intact, motor strength is 5/5 in right and 3-/5deltoid, bicep, tricep, grip, hip flexor, knee extensors, ankle dorsiflexor and plantar flexor Sensory exam reduced sensation to LT in LUE > LLE  Musculoskeletal: Full range of motion in all 4 extremities. No joint swelling, mild  tenderness throughout b/l UE and LE   Assessment/Plan: 1. Functional deficits which require 3+ hours per day of interdisciplinary therapy in a comprehensive inpatient rehab setting. Physiatrist is providing close team supervision and 24 hour management of active medical problems listed below. Physiatrist and rehab team continue to assess barriers to discharge/monitor patient progress toward functional and medical goals  Care Tool:  Bathing    Body parts bathed by patient: Right  arm, Left arm, Chest, Abdomen, Front perineal area, Right upper leg, Left upper leg, Right lower leg, Left lower leg   Body parts bathed by helper: Buttocks Body parts n/a: Front perineal area, Buttocks, Right lower leg, Left lower leg, Left upper leg, Left arm, Right arm   Bathing assist Assist Level: Minimal Assistance - Patient > 75%     Upper Body Dressing/Undressing Upper body dressing   What is the patient wearing?: Pull over shirt    Upper body assist Assist Level: Maximal Assistance - Patient 25 - 49%    Lower Body Dressing/Undressing Lower body dressing      What is the patient wearing?: Incontinence brief, Pants     Lower body assist Assist for lower body dressing: Maximal Assistance - Patient 25 - 49%     Toileting Toileting Toileting Activity did not occur (Clothing management and hygiene only): N/A (no void or bm)  Toileting assist Assist for toileting: 2 Helpers (MAX A +2)     Transfers Chair/bed transfer  Transfers assist     Chair/bed transfer assist level: Minimal Assistance - Patient > 75% (stand pivot to R)     Locomotion Ambulation   Ambulation assist   Ambulation activity did not occur: Safety/medical concerns  Assist level: Moderate Assistance - Patient 50 - 74% (+2 for mirror feedback) Assistive device: Walker-rolling Max distance:  73f   Walk 10 feet activity   Assist  Walk 10 feet activity did not occur: Safety/medical concerns        Walk 50 feet activity   Assist Walk 50 feet with 2 turns activity did not occur: Safety/medical concerns         Walk 150 feet activity   Assist Walk 150 feet activity did not occur: Safety/medical concerns         Walk 10 feet on uneven surface  activity   Assist Walk 10 feet on uneven surfaces activity did not occur: Safety/medical concerns         Wheelchair     Assist Is the patient using a wheelchair?: Yes Type of Wheelchair: Manual Wheelchair activity did not  occur: Safety/medical concerns         Wheelchair 50 feet with 2 turns activity    Assist    Wheelchair 50 feet with 2 turns activity did not occur: Safety/medical concerns       Wheelchair 150 feet activity     Assist  Wheelchair 150 feet activity did not occur: Safety/medical concerns       Blood pressure (!) 130/57, pulse 85, temperature 98 F (36.7 C), resp. rate 16, height '5\' 1"'$  (1.549 m), weight 52.5 kg, SpO2 94 %.  Medical Problem List and Plan: 1. Functional deficits secondary to right MCA infarction, A2 and P2 occlusion status post TNK/status post thrombectomy             -patient may  shower             -ELOS/Goals: 05/22/22 Min/Mod A goals for ADLs and Mobility   Continue CIR PT/OT/SLP  -  2.  Antithrombotics: -DVT/anticoagulation:  SCD             -antiplatelet therapy: Eliquis 2.5 mg BID 3. Diabetic peripheral neuropathy: Discussed [facility] outpatient and daughter would like for her to try- will ask April to schedule. Lidoderm patch.  Tramadol as needed  -Continue tramadol PRN, daughter reports poor response to a nerve pain medication but does not remember name of this medication  -Daughter says medications that caused her nausea in past was cymbalta, discussed trying gabapentin, she would like to hold off unless pain worsens  4. Mood/Behavior/Sleep: Provide emotional support             -antipsychotic agents: N/A 5. Neuropsych/cognition: This patient IS capable of making decisions on her own behalf. 6. Skin/Wound Care: Routine skin checks 7. Fluids/Electrolytes/Nutrition: Routine in and outs with follow-up chemistries 8.  Atrial fibrillation/pacemaker.  continue Lopressor 12.5 mg every 12 hours.  Cardiac rate controlled  Eliquis 2.'5mg'$  BID 9.  Diabetes mellitus.  Hemoglobin A1c 6.9.  SSI. CBG (last 3)  Recent Labs    05/14/22 1641 05/14/22 2031 05/15/22 0546  GLUCAP 171* 167* 150*   12/28 fair control  10.  Dysphagia.  Dysphagia #3 thin liquids.   Follow-up speech therapy -Upgraded to regular/thin 12/24 11.  GERD. continue Protonix  12.  Hypo K+ improved after po supplementation cont to monitor  -BMP tomorrow ordered    Latest Ref Rng & Units 05/10/2022    5:43 AM 05/09/2022    5:34 AM 05/06/2022    5:41 AM  BMP  Glucose 70 - 99 mg/dL 133  123  140   BUN 8 - 23 mg/dL '16  11  14   '$ Creatinine 0.44 - 1.00 mg/dL 0.83  0.75  0.88   Sodium 135 - 145 mmol/L  138  138  137   Potassium 3.5 - 5.1 mmol/L 4.0  2.9  3.5   Chloride 98 - 111 mmol/L 99  95  96   CO2 22 - 32 mmol/L '26  31  30   '$ Calcium 8.9 - 10.3 mg/dL 8.9  9.1  8.7    -Discussed with pharmacy, recommended KCL oral 54mq x2 and recheck tomorrow , appreciate assistance -K+ up to 4.0 on 12/23  13.  Thrush  -s/p diflucan 12/20  -resolved, stop magic mouthwash 14. Moderate malnutrition  -Consult dietician 15. Lower extremity edema: resolved, placed order to place SCDs at night for circulation. 16. UTI: keflex started, f/u UC  -Urine culture with ecoli sensitive to cefazolin, continue keflex     LOS: 10 days A FACE TO FACE EVALUATION WAS PERFORMED  ACharlett Blake12/28/2023, 8:29 AM

## 2022-05-15 NOTE — Progress Notes (Signed)
Occupational Therapy Session Note  Patient Details  Name: Kara [name] MRN: [id number] Date of Birth: [birth year]  Today's Date: 05/15/2022 OT Individual Time: 1132-1202 OT Individual Time Calculation (min): 30 min    Short Term Goals: Week 2:  OT Short Term Goal 1 (Week 2): Pt will don pull over shirt with mod A. OT Short Term Goal 2 (Week 2): Pt will don pants over feet with mod A. OT Short Term Goal 3 (Week 2): Pt will be able to hold static stand with min A so caregiver can adjust clothing over hips. OT Short Term Goal 4 (Week 2): Pt will complete toilet transfers with min A.  Skilled Therapeutic Interventions/Progress Updates:  Pt greeted seated in w/c, pt agreeable to OT intervention. Session focus on LUE NMR. Total A transport to gym in w/c for time mgmt. Pt completed below activities to facilitate improved NMR to RUE.   - sit>stands with LUE positioned on EOM to promote increased weight bearing to LUE, pt able to stand with MIN A with L lean in standing - rolling weighted ball forward and back to provide NMR to LUE with an emphasis on scapular protraction/retraction. Pt needed MIN support at L elbow to complete task - grasping wash cloth from sitting with LUE and placing on opposite side of RUE to facilitate targeted reach, improved LUE digit AROM, visual scanning and visual attention to L side.                 Ended session with pt seated in w/c with all needs within reach and safety belt alarm activated.                    Therapy Documentation Precautions:  Precautions Precautions: Fall Precaution Comments: L hemipareisis with L neglect, intermittent pusher Restrictions Weight Bearing Restrictions: No   Pain: unrated nerve pain reported in L side, rest breaks provided as needed.     Therapy/Group: Individual Therapy  Precious Haws 05/15/2022, 12:23 PM

## 2022-05-16 LAB — GLUCOSE, CAPILLARY
Glucose-Capillary: 161 mg/dL — ABNORMAL HIGH (ref 70–99)
Glucose-Capillary: 221 mg/dL — ABNORMAL HIGH (ref 70–99)
Glucose-Capillary: 171 mg/dL — ABNORMAL HIGH (ref 70–99)
Glucose-Capillary: 182 mg/dL — ABNORMAL HIGH (ref 70–99)

## 2022-05-16 MED ORDER — GABAPENTIN 100 MG PO CAPS
100.0000 mg | ORAL_CAPSULE | Freq: Once | ORAL | Status: AC
  Administered 2022-05-16: 100 mg via ORAL
  Filled 2022-05-16: qty 1

## 2022-05-16 NOTE — Progress Notes (Signed)
Patient ID: Kara [name], female   DOB: Jan 14, 1923, 88 y.o.   MRN: [id number]  SW reached out to [facility] to receive fax number to fax clinicals. Facility listed on the hub as [facility]. Clinicals submitted on hub.   Geralyn Flash Direct: [phone]

## 2022-05-16 NOTE — Progress Notes (Signed)
Nutrition Follow-up  DOCUMENTATION CODES:   Not applicable  INTERVENTION:   Continue Ensure Enlive po BID, each supplement provides 350 kcal and 20 grams of protein. Encourage good PO intake  Continue Multivitamin w/ minerals daily  NUTRITION DIAGNOSIS:   Inadequate oral intake related to poor appetite, lethargy/confusion as evidenced by meal completion < 25%. - Progressing  GOAL:   Patient will meet greater than or equal to 90% of their needs - Ongoing  MONITOR:   PO intake, Supplement acceptance  REASON FOR ASSESSMENT:   Consult Assessment of nutrition requirement/status  ASSESSMENT:   88 y.o. female admits to CIR related to functional deficits from stroke. PMH reviewed: afib, CAD, cancer, DM, GERD.  Pt laying in bed, reports that she is eating so-so. Denies any nausea or vomiting. Unsure about receiving Ensure's over the past few days, but thinks she already drank one today. Shares that she would prefer the chocolate flavor. RD observed one at bedside, not opened.   Meal Intakes 12/26-12/29: 20-85% x 8 meals (average 50%)  Medications reviewed and include: NovoLog SSI, MVI, Protonix Labs reviewed: Hgb A1c 6.9% (05/02/22), 24 hr CBGs 161-222  Diet Order:   Diet Order             Diet regular Room service appropriate? Yes; Fluid consistency: Thin  Diet effective now                  EDUCATION NEEDS:   Education needs have been addressed  Skin:  Skin Assessment: Skin Integrity Issues: Skin Integrity Issues:: Other (Comment) Other: puncture to right groin  Last BM:  12/27  Height:  Ht Readings from Last 1 Encounters:  05/12/22 '5\' 1"'$  (1.549 m)   Weight:  Wt Readings from Last 1 Encounters:  05/12/22 52.5 kg   BMI:  Body mass index is 21.87 kg/m.  Estimated Nutritional Needs:  Kcal:  1500-1700 Protein:  75-90 grams Fluid:  >/= 1.5 L    Hermina Barters RD, LDN Clinical Dietitian See [facility] for contact information.

## 2022-05-16 NOTE — Progress Notes (Signed)
Physical Therapy Session Note  Patient Details  Name: Kara [name] MRN: [id number] Date of Birth: June 18, 1922  Today's Date: 05/16/2022 PT Individual Time: 0823-0902 PT Individual Time Calculation (min): 39 min   Short Term Goals: Week 1:  PT Short Term Goal 1 (Week 1): Pt will perform bed mobility with consistent ModA and improved initiation. PT Short Term Goal 1 - Progress (Week 1): Met PT Short Term Goal 2 (Week 1): Pt will perform sit<>stand with consistent ModA PT Short Term Goal 2 - Progress (Week 1): Progressing toward goal PT Short Term Goal 3 (Week 1): Pt will improve sitting balance to find midline and hold sitting balance for at least 53mn with overall Min/ ModA. PT Short Term Goal 3 - Progress (Week 1): Progressing toward goal PT Short Term Goal 4 (Week 1): Pt will tolerate standing with MaxA for at least 30sec. PT Short Term Goal 4 - Progress (Week 1): Met Week 2:  PT Short Term Goal 1 (Week 2): Pt will perform bed mobility with min assist using bed features if needed PT Short Term Goal 2 (Week 2): Pt will perform sit<>stands using LRAD with min assist PT Short Term Goal 3 (Week 2): Pt will perform bed<>chair transfers using LRAD with consistent mod assist PT Short Term Goal 4 (Week 2): Pt will ambulate at least 523fusing LRAD with mod assist of 1 PT Short Term Goal 5 (Week 2): Pt will maintain static standing balance using LRAD for 1 minute with no more than min assist  Skilled Therapeutic Interventions/Progress Updates:  Patient supine in bed with caregiver present and assisting pt with eating breakfast on entrance to room. Patient alert and agreeable to PT session after completing breakfast. Pt is able to look directly at PT and also demos ability to look at clock and tell time with clock across room on wall but directly in front of her with no turn of head.   Return to room with MD rounding on pt at time.   Patient with no pain complaint at start of session. But does  relate talking with MD re: pain in L foot > R foot especially at heel. Pt's heel continues to demo increase in redness and will most likely require Prevalon boot/s wear overnight.   Therapeutic Activity: Bed Mobility: Pt performed supine --> sit with MinA using bedrail. She is able to initiate bring BLE to EOB with max vc and min tc for bringing UB to upright seated position. MinA to scoot L hip toward EOB.  Return to Supine at end of session with MinA for BLE to bed surface. Pt is able to assist with final neutral bed positioning with MinA and heavy vc for techniaue required. Transfers: Pt performed sit<>stand transfer at Rw from EOB with MinA to stand and Mod/ MaxA to maintain standing balance with increased L lean. Pt relates need to toilet and STEDY used to transfer to toilet in bathroom for time. MinA/ CGA to rise to stand with use of RUE on pull bar. CGA/ supervision to stand from perch seat on STEDY.  Provided verbal cues forsequencing technique and for foot positioning on STEDY. MaxA dressing performed while seated on toilet using STEDY bar for pt to assist with balance.   UB dressing performed while seated on perch seat in STEDY with consistent cues for maintaining midline and caregiver providing +2 assist for dressing.  Neuromuscular Re-ed: NMR facilitated during session with focus on seated standing position and balance in finding midline. Pt  guided with mainly vc and min tc for finding midline in sitting. Significant lean noted this day and pt is able to initiate correction but unable to bring self to full upright midline. Same performed in perch stance at [facility]. Lean continues in full stance at grab bar but is slightly improved from seated position.  NMR performed for improvements in motor control and coordination, balance, sequencing, judgement, and self confidence/ efficacy in performing all aspects of mobility at highest level of independence.   Patient supine at end of session with brakes  locked, bed alarm set, and all needs within reach.   Therapy Documentation Precautions:  Precautions Precautions: Fall Precaution Comments: L hemipareisis with L neglect, intermittent pusher Restrictions Weight Bearing Restrictions: No General:   Vital Signs:  Pain: Pain Assessment Pain Scale: 0-10 Pain Score: 10-Worst pain ever Faces Pain Scale: Hurts worst Pain Type: Neuropathic pain Pain Location: Foot Pain Orientation: Left Pain Descriptors / Indicators: Sharp Pain Frequency: Constant Pain Onset: Sudden Patients Stated Pain Goal: 0 Pain Intervention(s): Medication (See eMAR) Multiple Pain Sites: No PAINAD (Pain Assessment in Advanced Dementia) Breathing: normal Negative Vocalization: repeated troubled calling out, loud moaning/groaning, crying Facial Expression: facial grimacing Body Language: tense, distressed pacing, fidgeting Consolability: unable to console, distract or reassure PAINAD Score: 7  Therapy/Group: Individual Therapy  Alger Simons 05/16/2022, 7:44 AM

## 2022-05-16 NOTE — Progress Notes (Signed)
Orthopedic Tech Progress Note Patient Details:  Kara [name] [birth year] [id number] Called in order to [employer] from [facility] Patient ID: Kara [name], female   DOB: Jul 08, 1922, 88 y.o.   MRN: [id number]  [name] 05/16/2022, 10:28 AM

## 2022-05-16 NOTE — Progress Notes (Addendum)
Speech Language Pathology Daily Session Note  Patient Details  Name: Kara [name] MRN: [id number] Date of Birth: 05/20/1922  Today's Date: 05/16/2022 SLP Individual Time: 1100-1150 SLP Individual Time Calculation (min): 50 min  Short Term Goals: Week 2: SLP Short Term Goal 1 (Week 2): STG's = LTG's due to ELOS  Skilled Therapeutic Interventions: Pt seen this date for skilled ST intervention targeting swallowing and cognitive goals outlined in care plan. Pt received lethargic and OOB in w/c; reports she feels "terrible" due to not sleeping well and pain in her foot. Caregiver present throughout today's session. Agreeable to intervention with encouragement. Continues to endorse poor PO intake and appetite.  Given pt's reports from last session re: oral residuals, SLP provided skilled observation of regular textures and intake of thin liquids. Orally, pt exhibited slightly prolonged manipulation and transit + diminished bolus cohesion though functional. Benefited from [place] A verbal cues to utilize liquid rinse to assist in clearing oral residuals, particularly on lingual surface, and to take small + single sips. Pt exhibited reflexive cough only on one occasion when pt took sequential sips of thin liquid via cup; no s/sx concerning for aspiration when pt took single sips via cup - reinforced intake of only small + single sips via cup at this time. Pt only ate 1/2 of a breakfast bar/cookie and then stated she was full. States she does not like nutritional supplements. Provided gentle encouragement to try to eat when hungry and to eat snacks between meals as able; she verbalized understanding.   Re: cognition, pt oriented to self, location, and situation with supervision; Min A verbal cues to demonstrate orientation to date. Facilitated problem-solving and visual scanning via preferred leisure activity of completing crossword puzzles. Pt required overall Max A multimodal cues to include consistent cues to  utilize "lighhouse" scanning technique and utilize visual anchor to look left during task. Recalled 2 current physical deficits given Min A verbal cues. Limited by fatigue this date, stating she did not sleep at all last night due to pain in her foot. Pt actively falling asleep during last 10 minutes of today's session; therefore, pt missed 10 minutes of scheduled intervention.   Pt left in room and OOB in w/c with all safety measures activated and call bell within reach. Caregiver remained at bedside. Continue per current ST POC.  Pain Pain reported in foot; notified LPN. SLP provided distraction as pain intervention during session.  Therapy/Group: Individual Therapy  Leshae Mcclay A Aerianna Losey 05/16/2022, 1:21 PM

## 2022-05-16 NOTE — Progress Notes (Signed)
Occupational Therapy Session Note  Patient Details  Name: Kara [name] MRN: [id number] Date of Birth: 1922-12-20  Today's Date: 05/16/2022 OT Individual Time: 1027-2536 OT Individual Time Calculation (min): 72 min    Short Term Goals: Week 2:  OT Short Term Goal 1 (Week 2): Pt will don pull over shirt with mod A. OT Short Term Goal 2 (Week 2): Pt will don pants over feet with mod A. OT Short Term Goal 3 (Week 2): Pt will be able to hold static stand with min A so caregiver can adjust clothing over hips. OT Short Term Goal 4 (Week 2): Pt will complete toilet transfers with min A.  Skilled Therapeutic Interventions/Progress Updates:  Pt greeted asleep in supine, pt easily able to arouse but reports fatigue from not sleeping well last night d/t foot pain.  pt agreeable to OT intervention. Initial plan to shower, pt completed supine>sit to L side of bed with MINA. Once gettign ready to pivot to shower seat pt reports need to void bladder, switched out shower seat for [facility] with pt able to stand pivot to [facility] to L side with [facility] with therapist face to face with pt. Pt completed 3/3 toileting tasks with MOD A with pt assisting with clothing mgmt on R side and able to wipe anterior periarea in standing with set- up of toilet paper.   Stand pivot to w/c to L wide throughout session with MIN- MODA. Total A transport to gym where session focused on LUE NMR with pt completing activities as indicated below:  -sit>stands with LUE placed on table with pt instructed to shift weight into LUE during sit>stands, pt able to stand with MINA, L lean in standing needing MIN A at times to correct.  - lateral leans to LUE from EOM with pt able to push through LUE back to midline with CGA however pt reports this task causes back pain, therefore terminated task  -active horizontal shoulder ABD/ADD with pt sliding wash cloth R<>L on table with LUE with an emphasis on pushing through wash cloth and visually scanning to R  and L side -grasping wash cloth with LUE and transporting wash cloth over top of RUE placed on table to challenge visual scanning, LUE coordination/motor planning and targeted reach - graded task up and had pt grasp leggo blocks however pt with + difficulty motor planning how to grasp block with LUE, pt needed MIN A to achieve optimal hand positioning on block but then able to tranport block over RUE to work on [facility] and visual scanning.                    Ended session with pt seated in w/c with all needs within reach and safety belt alarm activated.                    Therapy Documentation Precautions:  Precautions Precautions: Fall Precaution Comments: L hemipareisis with L neglect, intermittent pusher Restrictions Weight Bearing Restrictions: No   Pain: unrated pain reported in back and L side, rest breaks, and repositioning provided as needed.     Therapy/Group: Individual Therapy  Corinne Ports [facility] 05/16/2022, 12:15 PM

## 2022-05-16 NOTE — Progress Notes (Addendum)
PROGRESS NOTE   Subjective/Complaints:  Pt c/o Left heel pain, no fall or injury, hurts when in bed  ROS- neg breathing issues or abd complaints Objective:   No results found. No results for input(s): "WBC", "HGB", "HCT", "PLT" in the last 72 hours.  No results for input(s): "NA", "K", "CL", "CO2", "GLUCOSE", "BUN", "CREATININE", "CALCIUM" in the last 72 hours.   Intake/Output Summary (Last 24 hours) at 05/16/2022 0829 Last data filed at 05/15/2022 1820 Gross per 24 hour  Intake 780 ml  Output --  Net 780 ml         Physical Exam: Vital Signs Blood pressure (!) 128/58, pulse 77, temperature 98 F (36.7 C), resp. rate 18, height '5\' 1"'$  (1.549 m), weight 52.5 kg, SpO2 95 %.  General: No acute distress Mood and affect are appropriate Heart: Regular rate and rhythm no rubs murmurs or extra sounds Tongue: thrush resolved Lungs: Clear to auscultation, breathing unlabored, no rales or wheezes Abdomen: Positive bowel sounds, soft nontender to palpation, nondistended Extremities: neg C/C/E Skin: erythema but no skin tear left lateral heel Neurologic: severe L neglect and homonymous hemianopsia , motor strength is 5/5 in right and 3-/5deltoid, bicep, tricep, grip, hip flexor, knee extensors, ankle dorsiflexor and plantar flexor Sensory exam reduced sensation to LT in LUE > LLE  Musculoskeletal: Full range of motion in all 4 extremities. No joint swelling, mild  tenderness throughout b/l UE and LE No pain with left ankle ROM, no pain over the Achiiles  Assessment/Plan: 1. Functional deficits which require 3+ hours per day of interdisciplinary therapy in a comprehensive inpatient rehab setting. Physiatrist is providing close team supervision and 24 hour management of active medical problems listed below. Physiatrist and rehab team continue to assess barriers to discharge/monitor patient progress toward functional and medical  goals  Care Tool:  Bathing    Body parts bathed by patient: Right arm, Left arm, Chest, Abdomen, Front perineal area, Right upper leg, Left upper leg, Right lower leg, Left lower leg   Body parts bathed by helper: Buttocks Body parts n/a: Front perineal area, Buttocks, Right lower leg, Left lower leg, Left upper leg, Left arm, Right arm   Bathing assist Assist Level: Minimal Assistance - Patient > 75%     Upper Body Dressing/Undressing Upper body dressing   What is the patient wearing?: Pull over shirt    Upper body assist Assist Level: Maximal Assistance - Patient 25 - 49%    Lower Body Dressing/Undressing Lower body dressing      What is the patient wearing?: Incontinence brief, Pants     Lower body assist Assist for lower body dressing: Maximal Assistance - Patient 25 - 49%     Toileting Toileting Toileting Activity did not occur (Clothing management and hygiene only): N/A (no void or bm)  Toileting assist Assist for toileting: 2 Helpers (MAX A +2)     Transfers Chair/bed transfer  Transfers assist     Chair/bed transfer assist level: Minimal Assistance - Patient > 75% (stand pivot to R)     Locomotion Ambulation   Ambulation assist   Ambulation activity did not occur: Safety/medical concerns  Assist level: Moderate Assistance -  Patient 50 - 74% (+2 for mirror feedback) Assistive device: Walker-rolling Max distance: 79f   Walk 10 feet activity   Assist  Walk 10 feet activity did not occur: Safety/medical concerns        Walk 50 feet activity   Assist Walk 50 feet with 2 turns activity did not occur: Safety/medical concerns         Walk 150 feet activity   Assist Walk 150 feet activity did not occur: Safety/medical concerns         Walk 10 feet on uneven surface  activity   Assist Walk 10 feet on uneven surfaces activity did not occur: Safety/medical concerns         Wheelchair     Assist Is the patient using a  wheelchair?: Yes Type of Wheelchair: Manual Wheelchair activity did not occur: Safety/medical concerns         Wheelchair 50 feet with 2 turns activity    Assist    Wheelchair 50 feet with 2 turns activity did not occur: Safety/medical concerns       Wheelchair 150 feet activity     Assist  Wheelchair 150 feet activity did not occur: Safety/medical concerns       Blood pressure (!) 128/58, pulse 77, temperature 98 F (36.7 C), resp. rate 18, height '5\' 1"'$  (1.549 m), weight 52.5 kg, SpO2 95 %.  Medical Problem List and Plan: 1. Functional deficits secondary to right MCA infarction, A2 and P2 occlusion status post TNK/status post thrombectomy             -patient may  shower             -ELOS/Goals: 05/22/22 Min/Mod A goals for ADLs and Mobility   Continue CIR PT/OT/SLP  -  2.  Antithrombotics: -DVT/anticoagulation:  SCD             -antiplatelet therapy: Eliquis 2.5 mg BID 3. Diabetic peripheral neuropathy: Discussed [facility] outpatient and daughter would like for her to try- will ask April to schedule. Lidoderm patch.  Tramadol as needed  -Continue tramadol PRN, daughter reports poor response to a nerve pain medication but does not remember name of this medication  -Daughter says medications that caused her nausea in past was cymbalta, discussed trying gabapentin, she would like to hold off unless pain worsens  4. Mood/Behavior/Sleep: Provide emotional support             -antipsychotic agents: N/A 5. Neuropsych/cognition: This patient IS capable of making decisions on her own behalf. 6. Skin/Wound Care: Routine skin checks Developing gr 1 decub left lateral heel will order PRAFO  7. Fluids/Electrolytes/Nutrition: Routine in and outs with follow-up chemistries 8.  Atrial fibrillation/pacemaker.  continue Lopressor 12.5 mg every 12 hours.  Cardiac rate controlled  Eliquis 2.'5mg'$  BID 9.  Diabetes mellitus.  Hemoglobin A1c 6.9.  SSI. CBG (last 3)  Recent Labs     05/15/22 1601 05/15/22 2103 05/16/22 0601  GLUCAP 222* 205* 161*   12/28 fair control  10.  Dysphagia.  Dysphagia #3 thin liquids.  Follow-up speech therapy -Upgraded to regular/thin 12/24 11.  GERD. continue Protonix  12.  Hypo K+ improved after po supplementation cont to monitor  -BMP tomorrow ordered    Latest Ref Rng & Units 05/10/2022    5:43 AM 05/09/2022    5:34 AM 05/06/2022    5:41 AM  BMP  Glucose 70 - 99 mg/dL 133  123  140   BUN 8 - 23 mg/dL  $'16  11  14   'e$ Creatinine 0.44 - 1.00 mg/dL 0.83  0.75  0.88   Sodium 135 - 145 mmol/L 138  138  137   Potassium 3.5 - 5.1 mmol/L 4.0  2.9  3.5   Chloride 98 - 111 mmol/L 99  95  96   CO2 22 - 32 mmol/L '26  31  30   '$ Calcium 8.9 - 10.3 mg/dL 8.9  9.1  8.7    -Discussed with pharmacy, recommended KCL oral 32mq x2 and recheck tomorrow , appreciate assistance -K+ up to 4.0 on 12/23  13.  Thrush  -s/p diflucan 12/20  -resolved, stop magic mouthwash 14. Moderate malnutrition  -Consult dietician 15. Lower extremity edema: resolved, placed order to place SCDs at night for circulation. 16. UTI: keflex started, f/u UC  -Urine culture with ecoli sensitive to cefazolin, continue keflex     LOS: 11 days A FACE TO FACE EVALUATION WAS PERFORMED  ACharlett Blake12/29/2023, 8:29 AM

## 2022-05-17 LAB — GLUCOSE, CAPILLARY
Glucose-Capillary: 164 mg/dL — ABNORMAL HIGH (ref 70–99)
Glucose-Capillary: 140 mg/dL — ABNORMAL HIGH (ref 70–99)
Glucose-Capillary: 140 mg/dL — ABNORMAL HIGH (ref 70–99)
Glucose-Capillary: 133 mg/dL — ABNORMAL HIGH (ref 70–99)

## 2022-05-17 MED ORDER — TRAZODONE HCL 50 MG PO TABS: 25.0000 mg | ORAL_TABLET | Freq: Every evening | ORAL | Status: DC | PRN

## 2022-05-17 MED ORDER — MELATONIN 3 MG PO TABS
3.0000 mg | ORAL_TABLET | Freq: Every day | ORAL | Status: DC
  Administered 2022-05-17 – 2022-05-21 (×5): 3 mg via ORAL
  Filled 2022-05-17 (×5): qty 1

## 2022-05-17 NOTE — Progress Notes (Signed)
Occupational Therapy Session Note  Patient Details  Name: Kara [name] MRN: [id number] Date of Birth: 05-07-1923  Today's Date: 05/17/2022 OT Individual Time: 1300-1410 OT Individual Time Calculation (min): 70 min    Short Term Goals: Week 2:  OT Short Term Goal 1 (Week 2): Pt will don pull over shirt with mod A. OT Short Term Goal 2 (Week 2): Pt will don pants over feet with mod A. OT Short Term Goal 3 (Week 2): Pt will be able to hold static stand with min A so caregiver can adjust clothing over hips. OT Short Term Goal 4 (Week 2): Pt will complete toilet transfers with min A.  Skilled Therapeutic Interventions/Progress Updates:    Pt received in recliner finishing lunch.  Pt c/o food getting stuck in her gum line and she could not rinse out so suggested we go to sink to brush teeth. Pt did well with sit to stand from recliner and holding her stand with only light CGA as therapist moved wc cushion from recliner to wc.  Pt then did a stand step transfer to wc with only min A.  Needed cue to turn head L and then pt was able to find her wc.  Pt set up with toothbrush and she was able to brush teeth and lean forward to the sink to spit into the sink.  Pt c/o of her bottom hurting,  and caregiver said the nurses mentioned replacing the alleyvn patch on her bottom.  Pt worked on standing up at the sink and [employer] for [employer].  Standing varied from min to max depending on how much attention she was putting in to attending to her balance and midline.  She needs visual cues such as " align yourself with frame of mirror".. changed out patch and applied cream to sore areas of bottom.   After standing several times, pt kept asking to lay down saying she was very tired.  Stand step to bed with min A and mod cues for tactile guidance on how to turn her hips.  Her L inattention makes turning challenging for her.   Pt then moved to supine with min A and was able to use legs to adjust self in bed.   Worked on LUE PROM, A/arom from bed and visual scanning to L.  Despite her frequent c/o fatigue, pt very energetic and very chatty this session talking fast.  She needs frequent reminders of the progress she has made to stay encouraged. Pt resting in bed with pillows to support alignment and splint on R foot with alarm set and all needs met.   Therapy Documentation Precautions:  Precautions Precautions: Fall Precaution Comments: L hemipareisis with L neglect, intermittent pusher Restrictions Weight Bearing Restrictions: No Pain:  No c/o pain     Therapy/Group: Individual Therapy  [place] 05/17/2022, 8:01 AM

## 2022-05-17 NOTE — Progress Notes (Signed)
Patient is very confused at night saying she does not know what to do and that we know better than her what's wrong with her. She attempted to get out of bed. NT and nurse returned patient to bed. Patient asked what she should do. Nurse and NT tried to reorient patient to time and patient is resting in bed with bed in lowest position and alarm on.

## 2022-05-17 NOTE — Progress Notes (Signed)
Physical Therapy Session Note  Patient Details  Name: Kara [name] MRN: [id number] Date of Birth: [birth year]  Today's Date: 05/17/2022 PT Individual Time: 0932-1043 PT Individual Time Calculation (min): 71 min   Short Term Goals: Week 2:  PT Short Term Goal 1 (Week 2): Pt will perform bed mobility with min assist using bed features if needed PT Short Term Goal 2 (Week 2): Pt will perform sit<>stands using LRAD with min assist PT Short Term Goal 3 (Week 2): Pt will perform bed<>chair transfers using LRAD with consistent mod assist PT Short Term Goal 4 (Week 2): Pt will ambulate at least 81ft using LRAD with mod assist of 1 PT Short Term Goal 5 (Week 2): Pt will maintain static standing balance using LRAD for 1 minute with no more than min assist   Skilled Therapeutic Interventions/Progress Updates:  Patient supine in bed on entrance to room. Patient alert and agreeable to PT session. Caregiver, Thomasenia Sales, present.   Patient with no pain complaint at start of session. Does demo back pain with upright stance especially with increased lean to L and need to activate R trunk musculature.   Pt confused re: need to toilet, but believes that she needs to "go". Caregiver relates that she has tried a few times this morning according to staff.  Therapeutic Activity: Bed Mobility: Pt performed supine --> sit EOB with Min/ ModA. VC/ tc required for technique sequence. Transfers: Pt performed sit<>stand at [facility] for quick transfer to toilet as well as to provide support for UB and UE while seated on toilet. Pt requires MinA for sit<>stand with pull at [facility] bar. Supervision for stance from perch seat. MaxA for brief change and clothing change. Transferred back to w/c for transport to therapy gym.   Gait Training:  Pt ambulated 20' x1/ 14'x1 using RW with LUE saddle splint and overall MinA initially and with fatigue requiring Mod for increasing L lean with distance.  Demonstrated good LLE advancement  initially and then stepping outside of RW with minimal correction made after vc.   Short distance ambulation using RW in room with MinA. Sit<>stand with MinA for improved seated position in w/c.  Neuromuscular Re-ed: NMR facilitated during session with focus on balance. Pt guided in sit<>stand from w/c to BUE support from therapist. Pt is able to maintain fairly upright posture throughout with minimal L lean. Blocked practice x5 with brief standing bouts. NMR performed for improvements in motor control and coordination, balance, sequencing, judgement, and self confidence/ efficacy in performing all aspects of mobility at highest level of independence.   Patient seated upright in w/c at end of session with brakes locked, no alarm set as caregiver in room providing supervision, and all needs within reach.   Therapy Documentation Precautions:  Precautions Precautions: Fall Precaution Comments: L hemipareisis with L neglect, intermittent pusher Restrictions Weight Bearing Restrictions: No General:   Vital Signs: Therapy Vitals Temp: 98.2 F (36.8 C) Pulse Rate: 71 Resp: 18 BP: (!) 124/54 Patient Position (if appropriate): Lying Oxygen Therapy SpO2: 96 % O2 Device: Room Air Pain: Pain Assessment Pain Scale: 0-10 Pain Score: 0-No pain  Therapy/Group: Individual Therapy  Loel Dubonnet PT, DPT, CSRS 05/17/2022, 5:13 PM

## 2022-05-17 NOTE — Progress Notes (Signed)
Physical Therapy Session Note  Patient Details  Name: Kara [name] MRN: [id number] Date of Birth: 12/25/1922  Today's Date: 05/17/2022 PT Individual Time: 1523-1610 PT Individual Time Calculation (min): 47 min   Short Term Goals: Week 2:  PT Short Term Goal 1 (Week 2): Pt will perform bed mobility with min assist using bed features if needed PT Short Term Goal 2 (Week 2): Pt will perform sit<>stands using LRAD with min assist PT Short Term Goal 3 (Week 2): Pt will perform bed<>chair transfers using LRAD with consistent mod assist PT Short Term Goal 4 (Week 2): Pt will ambulate at least 53ft using LRAD with mod assist of 1 PT Short Term Goal 5 (Week 2): Pt will maintain static standing balance using LRAD for 1 minute with no more than min assist  Skilled Therapeutic Interventions/Progress Updates:  Patient supine in bed on entrance to room. Patient alert and agreeable to PT session.   Patient with no pain complaint at start of session.  Therapeutic Activity: Bed Mobility: Pt guided in sequencing of UE and LE in order to perform roll to R. MinA provided for positioning and initiation of RUE to reach for bedrail. LLE in hooklying to assist with push of hips to roll R. VC/ tc to initiate push with RUE to reach seated position on EOB with CGA.   Transfers/ NMR: Pt performed sit<>stand and stand pivot transfers throughout session with MinA initially and ModA for stand pivot with BUE HHA and tc at ribcage to initiate forward lean in sitting prior to rise to stand. Blocked practice x 6. Provided verbal cues for technique and correct sequencing throughout.  At end of session, pt guided in return to sidelying requiring Min/ ModA to bring BLE to bed surface, but able to lie UB down to [facility].   Pt is also able to position self in hooklying with RUE to bedrail and then use of BLE to position self toward [facility] with MinA to complete. Bed in flat position.   NMR performed for improvements in motor control  and coordination, balance, sequencing, judgement, and self confidence/ efficacy in performing all aspects of mobility at highest level of independence.   Patient supine in bed at end of session with brakes locked, bed alarm set, and all needs within reach.   Therapy Documentation Precautions:  Precautions Precautions: Fall Precaution Comments: L hemipareisis with L neglect, intermittent pusher Restrictions Weight Bearing Restrictions: No General:   Vital Signs: Therapy Vitals Temp: 98.2 F (36.8 C) Pulse Rate: 71 Resp: 18 BP: (!) 124/54 Patient Position (if appropriate): Lying Oxygen Therapy SpO2: 96 % O2 Device: Room Air Pain: Pain Assessment Pain Scale: 0-10 Pain Score: 0-No pain at end of session  Minimal c/o back pain throughout session.   Therapy/Group: Individual Therapy  Loel Dubonnet PT, DPT, CSRS 05/17/2022, 6:25 PM

## 2022-05-18 ENCOUNTER — Inpatient Hospital Stay (HOSPITAL_COMMUNITY): Payer: Medicare Other

## 2022-05-18 LAB — GLUCOSE, CAPILLARY
Glucose-Capillary: 152 mg/dL — ABNORMAL HIGH (ref 70–99)
Glucose-Capillary: 235 mg/dL — ABNORMAL HIGH (ref 70–99)
Glucose-Capillary: 219 mg/dL — ABNORMAL HIGH (ref 70–99)
Glucose-Capillary: 219 mg/dL — ABNORMAL HIGH (ref 70–99)

## 2022-05-18 MED ORDER — CITALOPRAM HYDROBROMIDE 10 MG PO TABS
10.0000 mg | ORAL_TABLET | Freq: Every day | ORAL | Status: DC
  Administered 2022-05-18 – 2022-05-22 (×5): 10 mg via ORAL
  Filled 2022-05-18 (×5): qty 1

## 2022-05-18 NOTE — Discharge Summary (Addendum)
Physician Discharge Summary  Patient ID: Kara [name] MRN: [id number] DOB/AGE: [birth year] 86 y.o.  Admit date: 05/05/2022 Discharge date: 05/22/2022  Discharge Diagnoses:  Principal Problem:   Right middle cerebral artery stroke [facility]) DVT prophylaxis Atrial fibrillation/pacemaker Diabetes mellitus Pain management Dysphagia GERD E. coli UTI History of uterine cancer Hyperlipidemia Mood stabilization   Discharged Condition: Stable  Significant Diagnostic Studies: DG CHEST PORT 1 VIEW  Result Date: 05/18/2022 CLINICAL DATA:  Confusion EXAM: PORTABLE CHEST 1 VIEW COMPARISON:  06/09/2019 and prior radiographs FINDINGS: Cardiomegaly and LEFT pacemaker again identified. There is no evidence of focal airspace disease, pulmonary edema, suspicious pulmonary nodule/mass, pleural effusion, or pneumothorax. No acute bony abnormalities are identified. IMPRESSION: Cardiomegaly without acute cardiopulmonary disease. Electronically Signed   By: Margarette Canada M.D.   On: 05/18/2022 11:41   CT HEAD WO CONTRAST (5MM)  Result Date: 05/02/2022 CLINICAL DATA:  Stroke follow-up EXAM: CT HEAD WITHOUT CONTRAST TECHNIQUE: Contiguous axial images were obtained from the base of the skull through the vertex without intravenous contrast. RADIATION DOSE REDUCTION: This exam was performed according to the departmental dose-optimization program which includes automated exposure control, adjustment of the mA and/or kV according to patient size and/or use of iterative reconstruction technique. COMPARISON:  None Available. FINDINGS: Brain: Early cytotoxic edema throughout most of the right MCA territory. No acute hemorrhage. No midline shift. Generalized atrophy with findings chronic microvascular ischemia Vascular: No abnormal hyperdensity of the major intracranial arteries or dural venous sinuses. No intracranial atherosclerosis. Skull: The visualized skull base, calvarium and extracranial soft tissues are normal.  Sinuses/Orbits: No fluid levels or advanced mucosal thickening of the visualized paranasal sinuses. No mastoid or middle ear effusion. The orbits are normal. IMPRESSION: 1. Early cytotoxic edema throughout most of the right MCA territory, consistent with early subacute infarct. 2. No acute hemorrhage or midline shift. Electronically Signed   By: Ulyses Jarred M.D.   On: 05/02/2022 19:49   IR PERCUTANEOUS ART THROMBECTOMY/INFUSION INTRACRANIAL INC DIAG ANGIO  Result Date: 05/02/2022 INDICATION: Kara [name] is a 88 year old female presenting with left facial droop, left hemiplegia, left hemineglect, right gaze deviation and dysarthria; NIHSS 19. Her last known well was 13:20 on 05/01/2022. Her past medical history is significant for atrial fibrillation, CAD, DM, glaucoma, pacemaker, HLD, HTN, anemia and OSA.; baseline modified Rankin scale 1-2. Head CT showed no lartge acute territorial infarct or hemorrhage. Patient received IV TNK. CTA angiogram of the head and neck showed occlusion of the distal right M1/MCA, mid P2/PCA and right A3-A4/ACA. Patient was then transferred to our servicefor mechanical thrombectomy. EXAM: ULTRASOUND GUIDED VASCULAR ACCESS DIAGNOSTIC CEREBRAL ANGIOGRAM MECHANICAL THROMBECTOMY (ANTERIOR CIRCULATION) MECHANICAL THROMBECTOMY (POSTERIOR CIRCULATION) FLAT PANEL HEAD CT COMPARISON:  CT/CT angiogram head neck May 01, 2022. MEDICATIONS: No antibiotics administered. ANESTHESIA/SEDATION: The procedure was performed under general anesthesia. CONTRAST:  90 mL of Omnipaque FLUOROSCOPY: Radiation Exposure Index (as provided by the fluoroscopic device): 707 mGy Kerma COMPLICATIONS: None immediate. TECHNIQUE: Informed written consent was obtained from the patient after a thorough discussion of the procedural risks, benefits and alternatives. All questions were addressed. Maximal Sterile Barrier Technique was utilized including caps, mask, sterile gowns, sterile gloves, sterile drape, hand  hygiene and skin antiseptic. A timeout was performed prior to the initiation of the procedure. The right groin was prepped and draped in the usual sterile fashion. Using a micropuncture kit and the modified Seldinger technique, access was gained to the right common femoral artery and an 8 French sheath was placed.  Real-time ultrasound guidance was utilized for vascular access including the acquisition of a permanent ultrasound image documenting patency of the accessed vessel. Under fluoroscopy, a Zoom 88 guide catheter was navigated over a 6 [place] VTK catheter and a 0.035" Terumo Glidewire into the aortic arch. The catheter was placed into the right common carotid artery and then advanced into the right internal carotid artery. The diagnostic catheter was removed. Frontal and lateral angiograms of the head were obtained. FINDINGS: 1. There is an occlusion of the mid/distal right M1/MCA. 2. Occlusion of the P2 segment of the fetal right PCA. 3. Patent proximal right ACA with occlusion at the A5 segment. PROCEDURE: Using biplane roadmap guidance, a Freeclimb 70 aspiration catheter was navigated over a Tenzing delivery catheter into the right M1/MCA. The aspiration catheter was then advanced to the level of occlusion and connected to an aspiration pump. Continuous aspiration was performed for 2 minutes. The guide catheter was connected to a VacLok syringe. The aspiration catheter was subsequently removed under constant aspiration. The guide catheter was aspirated for debris. Right internal carotid artery angiograms with frontal lateral views of the head showed recanalization of the segment with persistent occlusion of the distal right M3/MCA posterior division branch. Using biplane roadmap guidance, a Zoom 35 aspiration catheter was navigated over an Aristotle 24 microguidewire into the cavernous segment of the right ICA. The aspiration catheter was then advanced to the level of occlusion and connected to an  aspiration pump. Continuous aspiration was performed for 2 minutes. The guide catheter was connected to a VacLok syringe. The aspiration catheter was subsequently removed under constant aspiration. The guide catheter was aspirated for debris. Right internal carotid artery angiograms with frontal lateral views of the head showed complete recanalization of the right MCA vascular tree (TICI 3). Persistent occlusion of the P2/PCA segment was seen. Using biplane roadmap guidance, a Zoom 35 aspiration catheter was navigated over an Aristotle 24 microguidewire into the cavernous segment of the right ICA. The aspiration catheter was then advanced over the wire to the level of occlusion in the right P2 segment and connected to an aspiration pump. Continuous aspiration was performed for 2 minutes. The guide catheter was connected to a VacLok syringe. The aspiration catheter was subsequently removed under constant aspiration. The guide catheter was aspirated for debris. Right internal carotid artery angiograms with frontal lateral views of the head showed persistent occlusion of the right P2/PCA segment. Using biplane roadmap guidance, a Zoom 55 aspiration catheter was navigated over an Aristotle 24 microguidewire into the cavernous segment of the right ICA. The aspiration catheter was then advanced over the wire to the level of occlusion in the right P2 segment and connected to an aspiration pump. Continuous aspiration was performed for 2 minutes. The guide catheter was connected to a VacLok syringe. The aspiration catheter was subsequently removed under constant aspiration. The guide catheter was aspirated for debris. Right internal carotid artery angiogram showed complete recanalization of the fetal right PCA. No embolus to new territory seen. Flat panel CT of the head was obtained and post processed in a separate workstation with concurrent attending physician supervision. Selected images were sent to PACS. No evidence of  hemorrhagic complication. Right common femoral artery angiogram was obtained in right anterior oblique view. The puncture is at the level of the common femoral artery. Diffuse atherosclerotic the right femoral and iliac arteries with preserved caliber, adequate for closure device. The sheath was exchanged over the wire for an 8 [place] Angio-Seal which was  utilized for access closure. Immediate hemostasis was achieved. IMPRESSION: 1. Mechanical thrombectomy performed for treatment of a distal right M1/MCA and fetal right P2/PCA occlusions achieving complete recanalization in both territories (TICI 3). 2. No embolus to new territory. 3. No hemorrhagic complication. PLAN: Transfer to ICU for continued care. Electronically Signed   By: Pedro Earls M.D.   On: 05/02/2022 11:52   IR US Guide Vasc Access Right  Result Date: 05/02/2022 INDICATION: Kara [name] is a 88 year old female presenting with left facial droop, left hemiplegia, left hemineglect, right gaze deviation and dysarthria; NIHSS 19. Her last known well was 13:20 on 05/01/2022. Her past medical history is significant for atrial fibrillation, CAD, DM, glaucoma, pacemaker, HLD, HTN, anemia and OSA.; baseline modified Rankin scale 1-2. Head CT showed no lartge acute territorial infarct or hemorrhage. Patient received IV TNK. CTA angiogram of the head and neck showed occlusion of the distal right M1/MCA, mid P2/PCA and right A3-A4/ACA. Patient was then transferred to our servicefor mechanical thrombectomy. EXAM: ULTRASOUND GUIDED VASCULAR ACCESS DIAGNOSTIC CEREBRAL ANGIOGRAM MECHANICAL THROMBECTOMY (ANTERIOR CIRCULATION) MECHANICAL THROMBECTOMY (POSTERIOR CIRCULATION) FLAT PANEL HEAD CT COMPARISON:  CT/CT angiogram head neck May 01, 2022. MEDICATIONS: No antibiotics administered. ANESTHESIA/SEDATION: The procedure was performed under general anesthesia. CONTRAST:  90 mL of Omnipaque FLUOROSCOPY: Radiation Exposure Index (as provided by  the fluoroscopic device): 735 mGy Kerma COMPLICATIONS: None immediate. TECHNIQUE: Informed written consent was obtained from the patient after a thorough discussion of the procedural risks, benefits and alternatives. All questions were addressed. Maximal Sterile Barrier Technique was utilized including caps, mask, sterile gowns, sterile gloves, sterile drape, hand hygiene and skin antiseptic. A timeout was performed prior to the initiation of the procedure. The right groin was prepped and draped in the usual sterile fashion. Using a micropuncture kit and the modified Seldinger technique, access was gained to the right common femoral artery and an 8 French sheath was placed. Real-time ultrasound guidance was utilized for vascular access including the acquisition of a permanent ultrasound image documenting patency of the accessed vessel. Under fluoroscopy, a Zoom 88 guide catheter was navigated over a 6 [place] VTK catheter and a 0.035" Terumo Glidewire into the aortic arch. The catheter was placed into the right common carotid artery and then advanced into the right internal carotid artery. The diagnostic catheter was removed. Frontal and lateral angiograms of the head were obtained. FINDINGS: 1. There is an occlusion of the mid/distal right M1/MCA. 2. Occlusion of the P2 segment of the fetal right PCA. 3. Patent proximal right ACA with occlusion at the A5 segment. PROCEDURE: Using biplane roadmap guidance, a Freeclimb 70 aspiration catheter was navigated over a Tenzing delivery catheter into the right M1/MCA. The aspiration catheter was then advanced to the level of occlusion and connected to an aspiration pump. Continuous aspiration was performed for 2 minutes. The guide catheter was connected to a VacLok syringe. The aspiration catheter was subsequently removed under constant aspiration. The guide catheter was aspirated for debris. Right internal carotid artery angiograms with frontal lateral views of the head  showed recanalization of the segment with persistent occlusion of the distal right M3/MCA posterior division branch. Using biplane roadmap guidance, a Zoom 35 aspiration catheter was navigated over an Aristotle 24 microguidewire into the cavernous segment of the right ICA. The aspiration catheter was then advanced to the level of occlusion and connected to an aspiration pump. Continuous aspiration was performed for 2 minutes. The guide catheter was connected to a [facility]  syringe. The aspiration catheter was subsequently removed under constant aspiration. The guide catheter was aspirated for debris. Right internal carotid artery angiograms with frontal lateral views of the head showed complete recanalization of the right MCA vascular tree (TICI 3). Persistent occlusion of the P2/PCA segment was seen. Using biplane roadmap guidance, a Zoom 35 aspiration catheter was navigated over an Aristotle 24 microguidewire into the cavernous segment of the right ICA. The aspiration catheter was then advanced over the wire to the level of occlusion in the right P2 segment and connected to an aspiration pump. Continuous aspiration was performed for 2 minutes. The guide catheter was connected to a VacLok syringe. The aspiration catheter was subsequently removed under constant aspiration. The guide catheter was aspirated for debris. Right internal carotid artery angiograms with frontal lateral views of the head showed persistent occlusion of the right P2/PCA segment. Using biplane roadmap guidance, a Zoom 55 aspiration catheter was navigated over an Aristotle 24 microguidewire into the cavernous segment of the right ICA. The aspiration catheter was then advanced over the wire to the level of occlusion in the right P2 segment and connected to an aspiration pump. Continuous aspiration was performed for 2 minutes. The guide catheter was connected to a VacLok syringe. The aspiration catheter was subsequently removed under constant  aspiration. The guide catheter was aspirated for debris. Right internal carotid artery angiogram showed complete recanalization of the fetal right PCA. No embolus to new territory seen. Flat panel CT of the head was obtained and post processed in a separate workstation with concurrent attending physician supervision. Selected images were sent to PACS. No evidence of hemorrhagic complication. Right common femoral artery angiogram was obtained in right anterior oblique view. The puncture is at the level of the common femoral artery. Diffuse atherosclerotic the right femoral and iliac arteries with preserved caliber, adequate for closure device. The sheath was exchanged over the wire for an 8 [place] Angio-Seal which was utilized for access closure. Immediate hemostasis was achieved. IMPRESSION: 1. Mechanical thrombectomy performed for treatment of a distal right M1/MCA and fetal right P2/PCA occlusions achieving complete recanalization in both territories (TICI 3). 2. No embolus to new territory. 3. No hemorrhagic complication. PLAN: Transfer to ICU for continued care. Electronically Signed   By: Pedro Earls M.D.   On: 05/02/2022 11:52   IR CT Head Ltd  Result Date: 05/02/2022 INDICATION: Kara [name] is a 88 year old female presenting with left facial droop, left hemiplegia, left hemineglect, right gaze deviation and dysarthria; NIHSS 19. Her last known well was 13:20 on 05/01/2022. Her past medical history is significant for atrial fibrillation, CAD, DM, glaucoma, pacemaker, HLD, HTN, anemia and OSA.; baseline modified Rankin scale 1-2. Head CT showed no lartge acute territorial infarct or hemorrhage. Patient received IV TNK. CTA angiogram of the head and neck showed occlusion of the distal right M1/MCA, mid P2/PCA and right A3-A4/ACA. Patient was then transferred to our servicefor mechanical thrombectomy. EXAM: ULTRASOUND GUIDED VASCULAR ACCESS DIAGNOSTIC CEREBRAL ANGIOGRAM MECHANICAL  THROMBECTOMY (ANTERIOR CIRCULATION) MECHANICAL THROMBECTOMY (POSTERIOR CIRCULATION) FLAT PANEL HEAD CT COMPARISON:  CT/CT angiogram head neck May 01, 2022. MEDICATIONS: No antibiotics administered. ANESTHESIA/SEDATION: The procedure was performed under general anesthesia. CONTRAST:  90 mL of Omnipaque FLUOROSCOPY: Radiation Exposure Index (as provided by the fluoroscopic device): 654 mGy Kerma COMPLICATIONS: None immediate. TECHNIQUE: Informed written consent was obtained from the patient after a thorough discussion of the procedural risks, benefits and alternatives. All questions were addressed. Maximal Sterile Barrier Technique  was utilized including caps, mask, sterile gowns, sterile gloves, sterile drape, hand hygiene and skin antiseptic. A timeout was performed prior to the initiation of the procedure. The right groin was prepped and draped in the usual sterile fashion. Using a micropuncture kit and the modified Seldinger technique, access was gained to the right common femoral artery and an 8 French sheath was placed. Real-time ultrasound guidance was utilized for vascular access including the acquisition of a permanent ultrasound image documenting patency of the accessed vessel. Under fluoroscopy, a Zoom 88 guide catheter was navigated over a 6 [place] VTK catheter and a 0.035" Terumo Glidewire into the aortic arch. The catheter was placed into the right common carotid artery and then advanced into the right internal carotid artery. The diagnostic catheter was removed. Frontal and lateral angiograms of the head were obtained. FINDINGS: 1. There is an occlusion of the mid/distal right M1/MCA. 2. Occlusion of the P2 segment of the fetal right PCA. 3. Patent proximal right ACA with occlusion at the A5 segment. PROCEDURE: Using biplane roadmap guidance, a Freeclimb 70 aspiration catheter was navigated over a Tenzing delivery catheter into the right M1/MCA. The aspiration catheter was then advanced to the  level of occlusion and connected to an aspiration pump. Continuous aspiration was performed for 2 minutes. The guide catheter was connected to a VacLok syringe. The aspiration catheter was subsequently removed under constant aspiration. The guide catheter was aspirated for debris. Right internal carotid artery angiograms with frontal lateral views of the head showed recanalization of the segment with persistent occlusion of the distal right M3/MCA posterior division branch. Using biplane roadmap guidance, a Zoom 35 aspiration catheter was navigated over an Aristotle 24 microguidewire into the cavernous segment of the right ICA. The aspiration catheter was then advanced to the level of occlusion and connected to an aspiration pump. Continuous aspiration was performed for 2 minutes. The guide catheter was connected to a VacLok syringe. The aspiration catheter was subsequently removed under constant aspiration. The guide catheter was aspirated for debris. Right internal carotid artery angiograms with frontal lateral views of the head showed complete recanalization of the right MCA vascular tree (TICI 3). Persistent occlusion of the P2/PCA segment was seen. Using biplane roadmap guidance, a Zoom 35 aspiration catheter was navigated over an Aristotle 24 microguidewire into the cavernous segment of the right ICA. The aspiration catheter was then advanced over the wire to the level of occlusion in the right P2 segment and connected to an aspiration pump. Continuous aspiration was performed for 2 minutes. The guide catheter was connected to a VacLok syringe. The aspiration catheter was subsequently removed under constant aspiration. The guide catheter was aspirated for debris. Right internal carotid artery angiograms with frontal lateral views of the head showed persistent occlusion of the right P2/PCA segment. Using biplane roadmap guidance, a Zoom 55 aspiration catheter was navigated over an Aristotle 24 microguidewire  into the cavernous segment of the right ICA. The aspiration catheter was then advanced over the wire to the level of occlusion in the right P2 segment and connected to an aspiration pump. Continuous aspiration was performed for 2 minutes. The guide catheter was connected to a VacLok syringe. The aspiration catheter was subsequently removed under constant aspiration. The guide catheter was aspirated for debris. Right internal carotid artery angiogram showed complete recanalization of the fetal right PCA. No embolus to new territory seen. Flat panel CT of the head was obtained and post processed in a separate workstation with concurrent attending physician  supervision. Selected images were sent to PACS. No evidence of hemorrhagic complication. Right common femoral artery angiogram was obtained in right anterior oblique view. The puncture is at the level of the common femoral artery. Diffuse atherosclerotic the right femoral and iliac arteries with preserved caliber, adequate for closure device. The sheath was exchanged over the wire for an 8 [place] Angio-Seal which was utilized for access closure. Immediate hemostasis was achieved. IMPRESSION: 1. Mechanical thrombectomy performed for treatment of a distal right M1/MCA and fetal right P2/PCA occlusions achieving complete recanalization in both territories (TICI 3). 2. No embolus to new territory. 3. No hemorrhagic complication. PLAN: Transfer to ICU for continued care. Electronically Signed   By: Pedro Earls M.D.   On: 05/02/2022 11:52   ECHOCARDIOGRAM COMPLETE  Result Date: 05/02/2022    ECHOCARDIOGRAM REPORT   Patient Name:   Kara [name] Date of Exam: 05/02/2022 Medical Rec #:  [id number]     Height:       61.0 in Accession #:    [id number]    Weight:       133.2 lb Date of Birth:  1923/02/26      BSA:          1.589 m Patient Age:    7 years      BP:           128/73 mmHg Patient Gender: F             HR:           103 bpm. Exam Location:   Inpatient Procedure: 2D Echo, Cardiac Doppler, Color Doppler and Intracardiac            Opacification Agent Indications:    Stroke  History:        Patient has prior history of Echocardiogram examinations, most                 recent 11/26/2021. CAD, Pacemaker, Arrythmias:Atrial                 Fibrillation; Risk Factors:Diabetes, Hypertension, Dyslipidemia                 and Sleep Apnea. Anemia.  Sonographer:    Eartha Inch Referring Phys: [phone] ERIC LINDZEN  Sonographer Comments: Technically difficult study due to poor echo windows. Image acquisition challenging due to patient body habitus and Image acquisition challenging due to respiratory motion. IMPRESSIONS  1. Left ventricular ejection fraction, by estimation, is 60 to 65%. The left ventricle has normal function. The left ventricle has no regional wall motion abnormalities. There is mild left ventricular hypertrophy. Left ventricular diastolic parameters are indeterminate.  2. Right ventricular systolic function is mildly reduced. The right ventricular size is normal. There is normal pulmonary artery systolic pressure. The estimated right ventricular systolic pressure is 60.6 mmHg.  3. Left atrial size was severely dilated.  4. Right atrial size was mildly dilated.  5. The mitral valve is abnormal. Mild mitral valve regurgitation. No evidence of mitral stenosis. Moderate mitral annular calcification.  6. Tricuspid valve regurgitation is moderate to severe. Visually TR appears moderate, but does appear to be systolic flow reversal on hepatic vein doppler suggesting possible severe TR  7. The aortic valve is tricuspid. Aortic valve regurgitation is not visualized. Aortic valve sclerosis/calcification is present, without any evidence of aortic stenosis.  8. The inferior vena cava is dilated in size with <50% respiratory variability, suggesting right atrial pressure of 15 mmHg. FINDINGS  Left Ventricle: Left ventricular ejection fraction, by estimation, is 60  to 65%. The left ventricle has normal function. The left ventricle has no regional wall motion abnormalities. Definity contrast agent was given IV to delineate the left ventricular  endocardial borders. The left ventricular internal cavity size was normal in size. There is mild left ventricular hypertrophy. Left ventricular diastolic parameters are indeterminate. Right Ventricle: The right ventricular size is normal. No increase in right ventricular wall thickness. Right ventricular systolic function is mildly reduced. There is normal pulmonary artery systolic pressure. The tricuspid regurgitant velocity is 2.00 m/s, and with an assumed right atrial pressure of 15 mmHg, the estimated right ventricular systolic pressure is 09.8 mmHg. Left Atrium: Left atrial size was severely dilated. Right Atrium: Right atrial size was mildly dilated. Pericardium: There is no evidence of pericardial effusion. Mitral Valve: The mitral valve is abnormal. Moderate mitral annular calcification. Mild mitral valve regurgitation. No evidence of mitral valve stenosis. MV peak gradient, 5.5 mmHg. The mean mitral valve gradient is 2.0 mmHg. Tricuspid Valve: The tricuspid valve is normal in structure. Tricuspid valve regurgitation is moderate. Aortic Valve: The aortic valve is tricuspid. Aortic valve regurgitation is not visualized. Aortic valve sclerosis/calcification is present, without any evidence of aortic stenosis. Pulmonic Valve: The pulmonic valve was grossly normal. Pulmonic valve regurgitation is trivial. Aorta: The aortic root and ascending aorta are structurally normal, with no evidence of dilitation. Venous: The inferior vena cava is dilated in size with less than 50% respiratory variability, suggesting right atrial pressure of 15 mmHg. IAS/Shunts: The interatrial septum was not well visualized.  LEFT VENTRICLE PLAX 2D LVIDd:         4.20 cm   Diastology LVIDs:         2.60 cm   LV e' medial:    5.87 cm/s LV PW:         1.30 cm    LV E/e' medial:  17.6 LV IVS:        1.10 cm   LV e' lateral:   6.20 cm/s LVOT diam:     2.10 cm   LV E/e' lateral: 16.7 LVOT Area:     3.46 cm  RIGHT VENTRICLE            IVC RV S prime:     8.49 cm/s  IVC diam: 2.60 cm TAPSE (M-mode): 1.2 cm LEFT ATRIUM             Index        RIGHT ATRIUM           Index LA diam:        4.70 cm 2.96 cm/m   RA Area:     21.50 cm LA Vol (A2C):   67.2 ml 42.29 ml/m  RA Volume:   57.90 ml  36.44 ml/m LA Vol (A4C):   88.0 ml 55.38 ml/m LA Biplane Vol: 78.6 ml 49.47 ml/m   AORTA Ao Root diam: 3.30 cm Ao Asc diam:  3.50 cm MITRAL VALVE                TRICUSPID VALVE MV Area (PHT): 3.94 cm     TR Peak grad:   16.0 mmHg MV Peak grad:  5.5 mmHg     TR Mean grad:   12.0 mmHg MV Mean grad:  2.0 mmHg     TR Vmax:        200.00 cm/s MV Vmax:       1.17 m/s  TR Vmean:       166.0 cm/s MV Vmean:      72.8 cm/s MV Decel Time: 193 msec     SHUNTS MV E velocity: 103.50 cm/s  Systemic Diam: 2.10 cm Kara Milian MD Electronically signed by Kara Milian MD Signature Date/Time: 05/02/2022/10:31:01 AM    Final    CT ANGIO HEAD NECK W WO CM (CODE STROKE)  Result Date: 05/01/2022 CLINICAL DATA:  Stroke follow-up EXAM: CT ANGIOGRAPHY HEAD AND NECK TECHNIQUE: Multidetector CT imaging of the head and neck was performed using the standard protocol during bolus administration of intravenous contrast. Multiplanar CT image reconstructions and MIPs were obtained to evaluate the vascular anatomy. Carotid stenosis measurements (when applicable) are obtained utilizing NASCET criteria, using the distal internal carotid diameter as the denominator. RADIATION DOSE REDUCTION: This exam was performed according to the departmental dose-optimization program which includes automated exposure control, adjustment of the mA and/or kV according to patient size and/or use of iterative reconstruction technique. CONTRAST:  39m OMNIPAQUE IOHEXOL 350 MG/ML SOLN COMPARISON:  None Available. FINDINGS:  CT HEAD FINDINGS Please see same-day CT brain for intracranial findings. Review of the MIP images confirms the above findings CTA NECK FINDINGS Aortic arch: Standard branching. Imaged portion shows no evidence of aneurysm or dissection. No significant stenosis of the major arch vessel origins. Right carotid system: No evidence of dissection, stenosis (50% or greater), or occlusion. Left carotid system: No evidence of dissection, stenosis (50% or greater), or occlusion. Vertebral arteries: Codominant. No evidence of dissection, stenosis (50% or greater), or occlusion. Skeleton: Negative. Other neck: Negative. Upper chest: Small left pleural effusion. Partially visualized cardiac device in place. Review of the MIP images confirms the above findings CTA HEAD FINDINGS Anterior circulation: There is thrombus of the right M1/M2 junction with associated occlusion and nearly absent arborization in the right MCA territory. There is thrombus in an acute occlusion in the distal A2 segment of the right ACA (series 7, image 58). Posterior circulation: There is a fetal PCA on the right with the P2 segment supplied by the right PCOM. The P2 segment of the right PCA is focally occluded (series 7, image 104). Venous sinuses: As permitted by contrast timing, patent. Anatomic variants: Fetal PCA on the right. Review of the MIP images confirms the above findings IMPRESSION: 1. Acute occlusion at the right M1/M2 junction with nearly absent arborization in the right MCA territory. 2. Thrombus and acute occlusion of the distal A2 segment of the right ACA. 3. Focal occlusion of the P2 segment of the right PCA. 4. No hemodynamically significant stenosis in the neck. 5. Small bilateral pleural effusions. Findings were discussed with Dr. LCheral Markeron 05/01/22 at 2:44 PM. Electronically Signed   By: HMarin RobertsM.D.   On: 05/01/2022 15:02   CT HEAD CODE STROKE WO CONTRAST  Result Date: 05/01/2022 CLINICAL DATA:  Code stroke.  No history  provided. EXAM: CT HEAD WITHOUT CONTRAST TECHNIQUE: Contiguous axial images were obtained from the base of the skull through the vertex without intravenous contrast. RADIATION DOSE REDUCTION: This exam was performed according to the departmental dose-optimization program which includes automated exposure control, adjustment of the mA and/or kV according to patient size and/or use of iterative reconstruction technique. COMPARISON:  CT Brain 06/09/19 FINDINGS: Brain: Age-indeterminate, possibly acute infarct in the right cerebellum. No hemorrhage. No hydrocephalus. No extra-axial fluid collection. Vascular: Dense atherosclerotic calcifications involving the bilateral V4 segments of the vertebral arteries. Skull: Normal. Negative for fracture or focal lesion.  Sinuses/Orbits: No acute finding. Other: None. ASPECTS [facility] Stroke Program Early CT Score): 10 IMPRESSION: Age-indeterminate, possibly acute infarct in the right cerebellum. No hemorrhage. Findings were paged to Dr. Cheral Marker on 05/01/22 at 2:26 PM via [facility] paging system. Electronically Signed   By: Marin Roberts M.D.   On: 05/01/2022 14:27    Labs:  Basic Metabolic Panel: Recent Labs  Lab 05/19/22 0703  NA 137  K 3.5  CL 97*  CO2 28  GLUCOSE 165*  BUN 14  CREATININE 0.92  CALCIUM 8.8*    CBC: Recent Labs  Lab 05/19/22 0703  WBC 6.6  NEUTROABS 3.3  HGB 13.5  HCT 39.8  MCV 95.4  PLT 202    CBG: Recent Labs  Lab 05/19/22 2110 05/20/22 0520 05/20/22 1142 05/20/22 1631 05/20/22 2057  GLUCAP 110* 138* 183* 134* 146*   Family history.  Father with CAD as well as kidney disease mother with colon cancer.  Negative for bladder cancer or breast cancer  Brief HPI:   Kara [name] is a 88 y.o. right-handed female with history of atrial fibrillation/pacemaker not on anticoagulation, CAD, diet-controlled diabetes mellitus hypertension hyperlipidemia uterine cancer history of L2 compression fracture using tramadol as needed.  Per chart  review lives alone.  Resides independent living facility.  She has a PCA who assist with groceries and cleaning.  Ambulates with a rollator.  Resented 05/01/2022 with acute onset of left-sided weakness right gaze deviation and dysarthria.  Blood pressure elevated 200/100.  Cranial CT scan showed age-indeterminate possibly acute infarct right cerebellum.  No hemorrhage.  CT angiogram head and neck acute occlusion of the right M1 M2 junction with nearly absent arborization in the right MCA territory.  Thrombus and acute occlusion of the distal A2 segment of the right ACA.  Patient received TNK and revascularization/thrombectomy per interventional radiology.  Follow-up CT of the head 05/02/2022 showed some early cytotoxic edema throughout most of the right MCA territory consistent with early subacute infarction.  Echocardiogram with ejection fraction of 60 to 65% no wall motion abnormalities.  Admission chemistries unremarkable except potassium 2.7 glucose 198 total bilirubin 1.3.  Maintained on full-strength aspirin and changed to Eliquis 2.5 mg twice daily 12/18.  Therapy evaluations completed due to patient decreased functional mobility was admitted for a comprehensive rehab program.   Hospital Course: Kara [name] was admitted to rehab 05/05/2022 for inpatient therapies to consist of PT, ST and OT at least three hours five days a week. Past admission physiatrist, therapy team and rehab RN have worked together to provide customized collaborative inpatient rehab.  Pertaining to patient's right MCA infarction, A2 and P2 occlusion status post TNK with thrombectomy.  Maintained on Eliquis therapy follow-up neurology services.  Atrial fibrillation pacemaker remain on low-dose beta-blocker Eliquis ongoing cardiac rate controlled.  Blood sugars overall controlled hemoglobin A1c 6.9.  Diet has been advanced to regular consistency.  Protonix ongoing for GERD.  She did complete a course of Keflex for E. coli UTI no  dysuria or hematuria.  Pain manager use of Lidoderm patch as well as tramadol as needed.   Blood pressures were monitored on TID basis and controlled  Diabetes has been monitored with ac/hs CBG checks and SSI was use prn for tighter BS control.    Rehab course: During patient's stay in rehab weekly team conferences were held to monitor patient's progress, set goals and discuss barriers to discharge. At admission, patient required moderate assist sit to stand moderate assist stand pivot transfers  Physical exam.  Blood pressure 151/58 pulse 91 temperature 98.8 respirations 18 oxygen saturation 93% room air Constitutional.  No acute distress HEENT Head.  Normocephalic and atraumatic Eyes.  Round and reactive to light without nystagmus Neck.  Supple nontender no JVD without thyromegaly Cardiac regular rate and rhythm without any extra sounds or murmur heard Abdomen.  Soft nontender positive bowel sounds without rebound Respiratory effort normal no respiratory distress without wheeze Neurologic.  Demonstrates left-sided neglect.  Dysarthric but intelligible.  Provides name and age.  Cranial nerves II through XII intact.  Motor strength 4+/5 in right deltoid bicep tricep grip hip flexor knee extensors ankle dorsi plantarflexion. Left bicep tricep and finger flexion is to minus knee extensors, 3 - ankle dorsi plantarflexion.  He/She  has had improvement in activity tolerance, balance, postural control as well as ability to compensate for deficits. He/She has had improvement in functional use RUE/LUE  and RLE/LLE as well as improvement in awareness.  Perform supine to sit with minimal assist.  Able initiate to bring bilateral lower extremities to edge of bed with max verbal cues.  Form sit to stand transfers rolling walker edge of bed minimal assist.  Upper body dressing perform while seated on seat steady with consistent cues.  Patient did well with ADL sit to stand from recliner and holding her  stand with only light contact-guard.  Speech therapy continue to follow with diet advanced.  Full teaching completed plan discharge to home       Disposition: Discharge to home    Diet: Regular  Special Instructions: No driving smoking or alcohol  Medications at discharge 1.  Tylenol as needed 2.  Eliquis 2.5 mg p.o. twice daily 3.  Estrace vaginal cream every 72 hours as needed vaginal dryness 4.  Lidoderm patch changes directed 5.  Melatonin 3 mg p.o. nightly 6.  Lopressor 12.5 mg p.o. every 12 hours 7.  Multivitamin daily 8.  Protonix 40 mg p.o. nightly 9.  Tramadol 50 mg every 12 hours as needed pain 10.  Zinc oxide twice daily apply to coccyx 11.Celexa 10 mg daily   30-35 minutes were spent completing discharge summary and discharge planning  Discharge Instructions     Ambulatory referral to Neurology   Complete by: As directed    An appointment is requested in approximately: 4 weeks right MCA infarction   Ambulatory referral to Physical Medicine Rehab   Complete by: As directed    Moderate complexity follow-up 1 to 2 weeks right MCA infarction        Follow-up Information     Kirsteins, Luanna Salk, MD Follow up.   Specialty: Physical Medicine and Rehabilitation Why: Office to call for appointment Contact information: [place] [address] [phone]         Crecencio Mc, MD Follow up.   Specialty: Internal Medicine Contact information: [place] [address] [phone]                 Signed: Cathlyn Parsons 05/21/2022, 5:09 AM

## 2022-05-18 NOTE — Progress Notes (Signed)
Patient noted tonight that she wants someone there with her all the time. Nurse and NT comforted patient and reassured her that we would be close by if she needed [place].

## 2022-05-18 NOTE — Progress Notes (Signed)
PROGRESS NOTE   Subjective/Complaints:  Pt c/o feeling more confused "than normal"- also more depressed-  per her friend/family member in room, was very confused last night- LBM this AM;  Keflex finished yesterday.  Slept last night with melatonin well- didn't need trazodone.    Also per family member, had "bad day" yesterday due to mood; etc.   ROS- limited by communication/mood  Objective:   DG CHEST PORT 1 VIEW  Result Date: 05/18/2022 CLINICAL DATA:  Confusion EXAM: PORTABLE CHEST 1 VIEW COMPARISON:  06/09/2019 and prior radiographs FINDINGS: Cardiomegaly and LEFT pacemaker again identified. There is no evidence of focal airspace disease, pulmonary edema, suspicious pulmonary nodule/mass, pleural effusion, or pneumothorax. No acute bony abnormalities are identified. IMPRESSION: Cardiomegaly without acute cardiopulmonary disease. Electronically Signed   By: Margarette Canada M.D.   On: 05/18/2022 11:41   No results for input(s): "WBC", "HGB", "HCT", "PLT" in the last 72 hours.  No results for input(s): "NA", "K", "CL", "CO2", "GLUCOSE", "BUN", "CREATININE", "CALCIUM" in the last 72 hours.   Intake/Output Summary (Last 24 hours) at 05/18/2022 1545 Last data filed at 05/18/2022 1420 Gross per 24 hour  Intake 480 ml  Output --  Net 480 ml        Physical Exam: Vital Signs Blood pressure 122/64, pulse 82, temperature 98.1 F (36.7 C), temperature source Oral, resp. rate 18, height '5\' 1"'$  (1.549 m), weight 52.5 kg, SpO2 95 %.    General: awake, alert, flat, tearful; sitting up in bed; a friend/family member at bedside; NAD HENT: conjugate gaze; oropharynx moist CV: regular rate; no JVD Pulmonary: has a few rhonchi; good air movement B/L  GI: soft, NT, ND, (+)BS Psychiatric: tearful Neurological: able to tell me how she feels- knows she's in hospital and why here- also able to answer 2/3 orientation questions- able to name  nurse as well-  Extremities: neg C/C/E Skin: erythema but no skin tear left lateral heel Neurologic: severe L neglect and homonymous hemianopsia , motor strength is 5/5 in right and 3-/5deltoid, bicep, tricep, grip, hip flexor, knee extensors, ankle dorsiflexor and plantar flexor Sensory exam reduced sensation to LT in LUE > LLE  Musculoskeletal: Full range of motion in all 4 extremities. No joint swelling, mild  tenderness throughout b/l UE and LE No pain with left ankle ROM, no pain over the Achiiles  Assessment/Plan: 1. Functional deficits which require 3+ hours per day of interdisciplinary therapy in a comprehensive inpatient rehab setting. Physiatrist is providing close team supervision and 24 hour management of active medical problems listed below. Physiatrist and rehab team continue to assess barriers to discharge/monitor patient progress toward functional and medical goals  Care Tool:  Bathing    Body parts bathed by patient: Right arm, Left arm, Chest, Abdomen, Front perineal area, Right upper leg, Left upper leg, Right lower leg, Left lower leg   Body parts bathed by helper: Buttocks Body parts n/a: Front perineal area, Buttocks, Right lower leg, Left lower leg, Left upper leg, Left arm, Right arm   Bathing assist Assist Level: Minimal Assistance - Patient > 75%     Upper Body Dressing/Undressing Upper body dressing   What is the  patient wearing?: Pull over shirt    Upper body assist Assist Level: Maximal Assistance - Patient 25 - 49%    Lower Body Dressing/Undressing Lower body dressing      What is the patient wearing?: Incontinence brief, Pants     Lower body assist Assist for lower body dressing: Maximal Assistance - Patient 25 - 49%     Toileting Toileting Toileting Activity did not occur (Clothing management and hygiene only): N/A (no void or bm)  Toileting assist Assist for toileting: 2 Helpers (MAX A +2)     Transfers Chair/bed transfer  Transfers  assist     Chair/bed transfer assist level: Minimal Assistance - Patient > 75% (stand pivot to R)     Locomotion Ambulation   Ambulation assist   Ambulation activity did not occur: Safety/medical concerns  Assist level: Moderate Assistance - Patient 50 - 74% (+2 for mirror feedback) Assistive device: Walker-rolling Max distance: 27f   Walk 10 feet activity   Assist  Walk 10 feet activity did not occur: Safety/medical concerns        Walk 50 feet activity   Assist Walk 50 feet with 2 turns activity did not occur: Safety/medical concerns         Walk 150 feet activity   Assist Walk 150 feet activity did not occur: Safety/medical concerns         Walk 10 feet on uneven surface  activity   Assist Walk 10 feet on uneven surfaces activity did not occur: Safety/medical concerns         Wheelchair     Assist Is the patient using a wheelchair?: Yes Type of Wheelchair: Manual Wheelchair activity did not occur: Safety/medical concerns         Wheelchair 50 feet with 2 turns activity    Assist    Wheelchair 50 feet with 2 turns activity did not occur: Safety/medical concerns       Wheelchair 150 feet activity     Assist  Wheelchair 150 feet activity did not occur: Safety/medical concerns       Blood pressure 122/64, pulse 82, temperature 98.1 F (36.7 C), temperature source Oral, resp. rate 18, height '5\' 1"'$  (1.549 m), weight 52.5 kg, SpO2 95 %.  Medical Problem List and Plan: 1. Functional deficits secondary to right MCA infarction, A2 and P2 occlusion status post TNK/status post thrombectomy             -patient may  shower             -ELOS/Goals: 05/22/22 Min/Mod A goals for ADLs and Mobility   Con't CIR- PT, OT and SLP 2.  Antithrombotics: -DVT/anticoagulation:  SCD             -antiplatelet therapy: Eliquis 2.5 mg BID 3. Diabetic peripheral neuropathy: Discussed [facility] outpatient and daughter would like for her to try- will  ask April to schedule. Lidoderm patch.  Tramadol as needed  -Continue tramadol PRN, daughter reports poor response to a nerve pain medication but does not remember name of this medication  -Daughter says medications that caused her nausea in past was cymbalta, discussed trying gabapentin, she would like to hold off unless pain worsens  4. Mood/Behavior/Sleep: Provide emotional support             -antipsychotic agents: N/A 5. Neuropsych/cognition: This patient IS capable of making decisions on her own behalf. 6. Skin/Wound Care: Routine skin checks Developing gr 1 decub left lateral heel will order PRAFO  7. Fluids/Electrolytes/Nutrition: Routine in and outs with follow-up chemistries 8.  Atrial fibrillation/pacemaker.  continue Lopressor 12.5 mg every 12 hours.  Cardiac rate controlled  Eliquis 2.'5mg'$  BID 9.  Diabetes mellitus.  Hemoglobin A1c 6.9.  SSI. CBG (last 3)  Recent Labs    05/17/22 2127 05/18/22 0520 05/18/22 1146  GLUCAP 133* 152* 235*  12/28 fair control  12/31- CBG's controlled- con't regimen 10.  Dysphagia.  Dysphagia #3 thin liquids.  Follow-up speech therapy -Upgraded to regular/thin 12/24 11.  GERD. continue Protonix  12.  Hypo K+ improved after po supplementation cont to monitor  -BMP tomorrow ordered  12/31- will check labs in AM    Latest Ref Rng & Units 05/10/2022    5:43 AM 05/09/2022    5:34 AM 05/06/2022    5:41 AM  BMP  Glucose 70 - 99 mg/dL 133  123  140   BUN 8 - 23 mg/dL '16  11  14   '$ Creatinine 0.44 - 1.00 mg/dL 0.83  0.75  0.88   Sodium 135 - 145 mmol/L 138  138  137   Potassium 3.5 - 5.1 mmol/L 4.0  2.9  3.5   Chloride 98 - 111 mmol/L 99  95  96   CO2 22 - 32 mmol/L '26  31  30   '$ Calcium 8.9 - 10.3 mg/dL 8.9  9.1  8.7    -Discussed with pharmacy, recommended KCL oral 61mq x2 and recheck tomorrow , appreciate assistance -K+ up to 4.0 on 12/23  13.  Thrush  -s/p diflucan 12/20  -resolved, stop magic mouthwash 14. Moderate  malnutrition  -Consult dietician 15. Lower extremity edema: resolved, placed order to place SCDs at night for circulation. 16. UTI: keflex started, f/u UC  -Urine culture with ecoli sensitive to cefazolin, continue keflex 17. Confusion  12/31- just finished ABX for UTI- checked CXR- is negative for pneumonia/acute process- will order labs for AM; CMP and CBC with diff- also has been somewhat confused per team- it's hard to tell what's acute/more chronic since stroke.  18. Depression-crying  12/31- will try starting Celexa 10 mg daily for mood.       I spent a total of 38   minutes on total care today- >50% coordination of care- due to speaking with family member/friend as well as nursing x2- also independently reviewed CXR and went over report.    LOS: 13 days A FACE TO FACE EVALUATION WAS PERFORMED  Cheralyn Oliver 05/18/2022, 3:45 PM

## 2022-05-19 LAB — CBC WITH DIFFERENTIAL/PLATELET
Abs Immature Granulocytes: 0.01 10*3/uL (ref 0.00–0.07)
Basophils Absolute: 0 10*3/uL (ref 0.0–0.1)
Basophils Relative: 0 %
Eosinophils Absolute: 0.1 10*3/uL (ref 0.0–0.5)
Eosinophils Relative: 1 %
HCT: 39.8 % (ref 36.0–46.0)
Hemoglobin: 13.5 g/dL (ref 12.0–15.0)
Immature Granulocytes: 0 %
Lymphocytes Relative: 41 %
Lymphs Abs: 2.7 10*3/uL (ref 0.7–4.0)
MCH: 32.4 pg (ref 26.0–34.0)
MCHC: 33.9 g/dL (ref 30.0–36.0)
MCV: 95.4 fL (ref 80.0–100.0)
Monocytes Absolute: 0.4 10*3/uL (ref 0.1–1.0)
Monocytes Relative: 7 %
Neutro Abs: 3.3 10*3/uL (ref 1.7–7.7)
Neutrophils Relative %: 51 %
Platelets: 202 10*3/uL (ref 150–400)
RBC: 4.17 MIL/uL (ref 3.87–5.11)
RDW: 12.2 % (ref 11.5–15.5)
WBC: 6.6 10*3/uL (ref 4.0–10.5)
nRBC: 0 % (ref 0.0–0.2)

## 2022-05-19 LAB — COMPREHENSIVE METABOLIC PANEL
ALT: 15 U/L (ref 0–44)
AST: 37 U/L (ref 15–41)
Albumin: 2.7 g/dL — ABNORMAL LOW (ref 3.5–5.0)
Alkaline Phosphatase: 48 U/L (ref 38–126)
Anion gap: 12 (ref 5–15)
BUN: 14 mg/dL (ref 8–23)
CO2: 28 mmol/L (ref 22–32)
Calcium: 8.8 mg/dL — ABNORMAL LOW (ref 8.9–10.3)
Chloride: 97 mmol/L — ABNORMAL LOW (ref 98–111)
Creatinine, Ser: 0.92 mg/dL (ref 0.44–1.00)
GFR, Estimated: 57 mL/min — ABNORMAL LOW (ref >60)
Glucose, Bld: 165 mg/dL — ABNORMAL HIGH (ref 70–99)
Potassium: 3.5 mmol/L (ref 3.5–5.1)
Sodium: 137 mmol/L (ref 135–145)
Total Bilirubin: 1.3 mg/dL — ABNORMAL HIGH (ref 0.3–1.2)
Total Protein: 5.5 g/dL — ABNORMAL LOW (ref 6.5–8.1)

## 2022-05-19 LAB — GLUCOSE, CAPILLARY
Glucose-Capillary: 160 mg/dL — ABNORMAL HIGH (ref 70–99)
Glucose-Capillary: 148 mg/dL — ABNORMAL HIGH (ref 70–99)
Glucose-Capillary: 152 mg/dL — ABNORMAL HIGH (ref 70–99)
Glucose-Capillary: 110 mg/dL — ABNORMAL HIGH (ref 70–99)

## 2022-05-19 NOTE — Progress Notes (Signed)
PROGRESS NOTE   Subjective/Complaints:  Pt remembers me "Dr.K", also asked about CXR results, per hired caregiver doing well since yesterday afternoon   ROS- limited by communication/mood  Objective:   DG CHEST PORT 1 VIEW  Result Date: 05/18/2022 CLINICAL DATA:  Confusion EXAM: PORTABLE CHEST 1 VIEW COMPARISON:  06/09/2019 and prior radiographs FINDINGS: Cardiomegaly and LEFT pacemaker again identified. There is no evidence of focal airspace disease, pulmonary edema, suspicious pulmonary nodule/mass, pleural effusion, or pneumothorax. No acute bony abnormalities are identified. IMPRESSION: Cardiomegaly without acute cardiopulmonary disease. Electronically Signed   By: Margarette Canada M.D.   On: 05/18/2022 11:41   Recent Labs    05/19/22 0703  WBC 6.6  HGB 13.5  HCT 39.8  PLT 202    Recent Labs    05/19/22 0703  NA 137  K 3.5  CL 97*  CO2 28  GLUCOSE 165*  BUN 14  CREATININE 0.92  CALCIUM 8.8*     Intake/Output Summary (Last 24 hours) at 05/19/2022 1101 Last data filed at 05/19/2022 0846 Gross per 24 hour  Intake 460 ml  Output --  Net 460 ml         Physical Exam: Vital Signs Blood pressure (!) 147/66, pulse 85, temperature 98 F (36.7 C), resp. rate 16, height '5\' 1"'$  (1.549 m), weight 52.5 kg, SpO2 93 %.   General: No acute distress Mood and affect are appropriate Heart: Regular rate and rhythm no rubs murmurs or extra sounds Lungs: Clear to auscultation, breathing unlabored, no rales or wheezes Abdomen: Positive bowel sounds, soft nontender to palpation, nondistended Extremities: No clubbing, cyanosis, or edema  Skin: erythema but no skin tear left lateral heel Neurologic: severe L neglect and homonymous hemianopsia , motor strength is 5/5 in right and 3-/5deltoid, bicep, tricep, grip, hip flexor, knee extensors, ankle dorsiflexor and plantar flexor Sensory exam reduced sensation to LT in LUE >  LLE  Musculoskeletal: Full range of motion in all 4 extremities. No joint swelling, mild  tenderness throughout b/l UE and LE No pain with left ankle ROM, no pain over the Achiiles  Assessment/Plan: 1. Functional deficits which require 3+ hours per day of interdisciplinary therapy in a comprehensive inpatient rehab setting. Physiatrist is providing close team supervision and 24 hour management of active medical problems listed below. Physiatrist and rehab team continue to assess barriers to discharge/monitor patient progress toward functional and medical goals  Care Tool:  Bathing    Body parts bathed by patient: Right arm, Left arm, Chest, Abdomen, Front perineal area, Right upper leg, Left upper leg, Right lower leg, Left lower leg   Body parts bathed by helper: Buttocks Body parts n/a: Front perineal area, Buttocks, Right lower leg, Left lower leg, Left upper leg, Left arm, Right arm   Bathing assist Assist Level: Minimal Assistance - Patient > 75%     Upper Body Dressing/Undressing Upper body dressing   What is the patient wearing?: Pull over shirt    Upper body assist Assist Level: Maximal Assistance - Patient 25 - 49%    Lower Body Dressing/Undressing Lower body dressing      What is the patient wearing?: Incontinence brief, Pants  Lower body assist Assist for lower body dressing: Maximal Assistance - Patient 25 - 49%     Toileting Toileting Toileting Activity did not occur (Clothing management and hygiene only): N/A (no void or bm)  Toileting assist Assist for toileting: 2 Helpers (MAX A +2)     Transfers Chair/bed transfer  Transfers assist     Chair/bed transfer assist level: Minimal Assistance - Patient > 75% (stand pivot to R)     Locomotion Ambulation   Ambulation assist   Ambulation activity did not occur: Safety/medical concerns  Assist level: Moderate Assistance - Patient 50 - 74% (+2 for mirror feedback) Assistive device:  Walker-rolling Max distance: 89f   Walk 10 feet activity   Assist  Walk 10 feet activity did not occur: Safety/medical concerns        Walk 50 feet activity   Assist Walk 50 feet with 2 turns activity did not occur: Safety/medical concerns         Walk 150 feet activity   Assist Walk 150 feet activity did not occur: Safety/medical concerns         Walk 10 feet on uneven surface  activity   Assist Walk 10 feet on uneven surfaces activity did not occur: Safety/medical concerns         Wheelchair     Assist Is the patient using a wheelchair?: Yes Type of Wheelchair: Manual Wheelchair activity did not occur: Safety/medical concerns         Wheelchair 50 feet with 2 turns activity    Assist    Wheelchair 50 feet with 2 turns activity did not occur: Safety/medical concerns       Wheelchair 150 feet activity     Assist  Wheelchair 150 feet activity did not occur: Safety/medical concerns       Blood pressure (!) 147/66, pulse 85, temperature 98 F (36.7 C), resp. rate 16, height '5\' 1"'$  (1.549 m), weight 52.5 kg, SpO2 93 %.  Medical Problem List and Plan: 1. Functional deficits secondary to right MCA infarction, A2 and P2 occlusion status post TNK/status post thrombectomy             -patient may  shower             -ELOS/Goals: 05/22/22 Min/Mod A goals for ADLs and Mobility   Con't CIR- PT, OT and SLP 2.  Antithrombotics: -DVT/anticoagulation:  SCD             -antiplatelet therapy: Eliquis 2.5 mg BID 3. Diabetic peripheral neuropathy: Discussed [facility] outpatient and daughter would like for her to try- will ask April to schedule. Lidoderm patch.  Tramadol as needed  -Continue tramadol PRN, daughter reports poor response to a nerve pain medication but does not remember name of this medication  -Daughter says medications that caused her nausea in past was cymbalta, discussed trying gabapentin, she would like to hold off unless pain worsens   4. Mood/Behavior/Sleep: Provide emotional support             -antipsychotic agents: N/A 5. Neuropsych/cognition: This patient IS capable of making decisions on her own behalf. 6. Skin/Wound Care: Routine skin checks Developing gr 1 decub left lateral heel will order PRAFO  7. Fluids/Electrolytes/Nutrition: Routine in and outs with follow-up chemistries 8.  Atrial fibrillation/pacemaker.  continue Lopressor 12.5 mg every 12 hours.  Cardiac rate controlled  Eliquis 2.'5mg'$  BID 9.  Diabetes mellitus.  Hemoglobin A1c 6.9.  SSI. CBG (last 3)  Recent Labs  05/18/22 1642 05/18/22 2037 05/19/22 0604  GLUCAP 219* 219* 160*   12/28 fair control  12/31- CBG's controlled- con't regimen 10.  Dysphagia.  Dysphagia #3 thin liquids.  Follow-up speech therapy -Upgraded to regular/thin 12/24 11.  GERD. continue Protonix  12.  Hypo K+ improved after po supplementation cont to monitor  -BMP tomorrow ordered  12/31- will check labs in AM    Latest Ref Rng & Units 05/19/2022    7:03 AM 05/10/2022    5:43 AM 05/09/2022    5:34 AM  BMP  Glucose 70 - 99 mg/dL 165  133  123   BUN 8 - 23 mg/dL '14  16  11   '$ Creatinine 0.44 - 1.00 mg/dL 0.92  0.83  0.75   Sodium 135 - 145 mmol/L 137  138  138   Potassium 3.5 - 5.1 mmol/L 3.5  4.0  2.9   Chloride 98 - 111 mmol/L 97  99  95   CO2 22 - 32 mmol/L '28  26  31   '$ Calcium 8.9 - 10.3 mg/dL 8.8  8.9  9.1    -Discussed with pharmacy, recommended KCL oral 38mq x2 and recheck tomorrow , appreciate assistance -K+ up to 4.0 on 12/23  13.  Thrush  -s/p diflucan 12/20  -resolved, stop magic mouthwash 14. Moderate malnutrition  -Consult dietician 15. Lower extremity edema: resolved, placed order to place SCDs at night for circulation. 16. UTI: keflex completed 17. Confusion  Resolved CXR neg for acute process 18. Depression-crying, PBA also in differential   12/31- will try starting Celexa 10 mg daily for mood.        LOS: 14 days A FACE TO FACE  EVALUATION WAS PERFORMED  ACharlett Blake1/05/2022, 11:01 AM

## 2022-05-19 NOTE — Progress Notes (Signed)
Occupational Therapy Session Note  Patient Details  Name: Kara [name] MRN: [id number] Date of Birth: 1923-03-11  Today's Date: 05/19/2022 OT Individual Time: 0947-0962 OT Individual Time Calculation (min): 79 min    Short Term Goals: Week 2:  OT Short Term Goal 1 (Week 2): Pt will don pull over shirt with mod A. OT Short Term Goal 2 (Week 2): Pt will don pants over feet with mod A. OT Short Term Goal 3 (Week 2): Pt will be able to hold static stand with min A so caregiver can adjust clothing over hips. OT Short Term Goal 4 (Week 2): Pt will complete toilet transfers with min A.  Skilled Therapeutic Interventions/Progress Updates:  Pt greeted supine in bed, pt agreeable to OT intervention. Session focus on BADL reeducation, functional mobility, dynamic standing balance and decreasing overall caregiver burden.       Pt completed supine>sit with MIN A to scoot L hip to EOB. Pt completed stand pivot to shower chair to L side with MIN A. Total A transport into shower in roll in shower chair. Pt completed bathing with more of MODA as pt with pain in LUE declining to use LUE to wash body parts, pt with BM in shower needing assist to wash buttock.  Pt completed stand pivot back to bed with MINA, with pt reporting nausea and having liquid BM on bed pan. Total A for pericare and cleanliness d/t loose stool, RN and NT aware. Pt completed dressing from EOB with MAX A for UB dressing, and MAX A for LB dressing. Pt able to stand with [facility] while caregiver pulled pants up to waist line. Pt completed stand pivot to recliner with [place] with face to face assist.            Ended session with pt seated in recliner with all needs within reach and safety belt alarm activated.                    Therapy Documentation Precautions:  Precautions Precautions: Fall Precaution Comments: L hemipareisis with L neglect, intermittent pusher Restrictions Weight Bearing Restrictions: No   Pain: unrated pain "all over"  rest breaks and repositioning provided as needed.     Therapy/Group: Individual Therapy  Corinne Ports [facility] 05/19/2022, 12:04 PM

## 2022-05-19 NOTE — Progress Notes (Addendum)
Speech Language Pathology Daily Session Note  Patient Details  Name: Kara [name] MRN: [id number] Date of Birth: 1923-03-13  Today's Date: 05/19/2022 SLP Individual Time: 1300-1355 SLP Individual Time Calculation (min): 55 min  Short Term Goals: Week 2: SLP Short Term Goal 1 (Week 2): STG's = LTG's due to ELOS  Skilled Therapeutic Interventions: Pt seen this date for skilled ST intervention targeting cognitive goals outlined in care plan. Pt received awake/alert and OOB in recliner chair. Caregiver leaving for the day. Agreeable to intervention. Participatory with cues. Appearing forgetful. Did not eat lunch. With encouragement, agreeable to trying nutritional supplement, which pt only drank ~3 oz of within today's one hour session.  Today's session with emphasis on cognitive skill training in the context of functional IADL tasks. SLP provided list of current medications (e.g. name of medicine, dosage, reason for taking, and frequency). SLP facilitated recall and visual scanning by providing Max A verbal and visual cues for locating and reading medication names on left side of the paper with use of lighthouse scanning and visual anchor techniques, and Min-Mod A verbal and visual cues to scanning from left to right visual field and verbalizing disage, reason for taking, and frequency. Oriented x 4 to self, location, situation, and date with Sup A and no additional external aid references. Tolerated intake of thin liquid via cup with no overt s/sx concerning for aspiration given Sup A for taking small + single sips. Recalled weekend events and recommendations for no straws with Sup A. Benefited from [place] A verbal cues for redirection back to tasks throughout today's session; pt tangential at times. May benefit from finding preferred foods and placing meal orders next session, given ongoing poor intake. Of note, pt does report change in taste and appetite since CVA.  Pt left in room and OOB in recliner  chair with all safety measures activated and call bell within reach. Pt left with preferred show on TV to ease anxiety. Continue per current ST POC.  Pain No pain reported; NAD  Therapy/Group: Individual Therapy  Kaleyah Labreck A Korissa Horsford 05/19/2022, 1:57 PM

## 2022-05-19 NOTE — Progress Notes (Signed)
Physical Therapy Session Note  Patient Details  Name: Kara [name] MRN: [id number] Date of Birth: 11/06/1922  Today's Date: 05/19/2022 PT Individual Time: 4235-3614 PT Individual Time Calculation (min): 64 min   Short Term Goals: {ERX:5400867}  Skilled Therapeutic Interventions/Progress Updates:  Patient *** on entrance to room. Patient alert and agreeable to PT session.   Patient with no pain complaint at start of session.  Therapeutic Activity: Bed Mobility: Pt performed supine <> sit with ***. VC/ tc required for ***. Transfers: Pt performed sit<>stand and stand pivot transfers throughout session with ***. Provided verbal cues for***.  Gait Training:  Pt ambulated *** ft using *** with ***. Demonstrated ***. Provided vc/ tc for ***.  Wheelchair Mobility:  Pt propelled wheelchair *** feet with ***. Provided vc for ***.  Neuromuscular Re-ed: NMR facilitated during session with focus on***. Pt guided in ***. NMR performed for improvements in motor control and coordination, balance, sequencing, judgement, and self confidence/ efficacy in performing all aspects of mobility at highest level of independence.   Therapeutic Exercise: Pt performed the following exercises with vc/ tc for proper technique. ***  Patient *** at end of session with brakes locked, *** alarm set, and all needs within reach.  - pt requests to toilet. STEDY used for quick transfer in/ out as well as to provide LUE support  - donned shoes MaxA - sit<>stand training to no AD - pt FOF and requires RUE support to therapist's arm.  - ambulation 20' x1 with pt c/o pain in L hip with fatigue. @ instances of toe catch of LLE with shoes on and MinA to prevent forward fall.  - stand pivot back to bed with MinA.   Therapy Documentation Precautions:  Precautions Precautions: Fall Precaution Comments: L hemipareisis with L neglect, intermittent pusher Restrictions Weight Bearing Restrictions: No General:   Vital  Signs:  Pain: Pain in L hip with time in ambulation. Decreases with rest.   Therapy/Group: Individual Therapy  Alger Simons PT, DPT, CSRS 05/19/2022, 3:24 PM

## 2022-05-19 NOTE — Plan of Care (Signed)
  Problem: RH Balance Goal: LTG Patient will maintain dynamic sitting balance (PT) Description: LTG:  Patient will maintain dynamic sitting balance with assistance during mobility activities (PT) Flowsheets (Taken 05/19/2022 0940) LTG: Pt will maintain dynamic sitting balance during mobility activities with:: Minimal Assistance - Patient > 75% Note: Slower than expected progress.  Goal: LTG Patient will maintain dynamic standing balance (PT) Description: LTG:  Patient will maintain dynamic standing balance with assistance during mobility activities (PT) Flowsheets (Taken 05/19/2022 0940) LTG: Pt will maintain dynamic standing balance during mobility activities with:: Moderate Assistance - Patient 50 - 74% Note: Slower than expected progress.    Problem: Sit to Stand Goal: LTG:  Patient will perform sit to stand with assistance level (PT) Description: LTG:  Patient will perform sit to stand with assistance level (PT) Flowsheets (Taken 05/19/2022 0940) LTG: PT will perform sit to stand in preparation for functional mobility with assistance level: Minimal Assistance - Patient > 75% Note: Slower than expected progress.    Problem: RH Bed Mobility Goal: LTG Patient will perform bed mobility with assist (PT) Description: LTG: Patient will perform bed mobility with assistance, with/without cues (PT). Flowsheets (Taken 05/19/2022 0940) LTG: Pt will perform bed mobility with assistance level of: Minimal Assistance - Patient > 75% Note: Slower than expected progress.    Problem: RH Bed to Chair Transfers Goal: LTG Patient will perform bed/chair transfers w/assist (PT) Description: LTG: Patient will perform bed to chair transfers with assistance (PT). Flowsheets (Taken 05/19/2022 0940) LTG: Pt will perform Bed to Chair Transfers with assistance level: Moderate Assistance - Patient 50 - 74% Note: Slower than expected progress.    Problem: RH Car Transfers Goal: LTG Patient will perform car transfers with  assist (PT) Description: LTG: Patient will perform car transfers with assistance (PT). Flowsheets (Taken 05/19/2022 0940) LTG: Pt will perform car transfers with assist:: Moderate Assistance - Patient 50 - 74%   Problem: RH Furniture Transfers Goal: LTG Patient will perform furniture transfers w/assist (OT/PT) Description: LTG: Patient will perform furniture transfers  with assistance (OT/PT). Flowsheets (Taken 05/19/2022 0940) LTG: Pt will perform furniture transfers with assist:: Moderate Assistance - Patient 50 - 74% Note: Slower than expected progress.    Problem: RH Ambulation Goal: LTG Patient will ambulate in controlled environment (PT) Description: LTG: Patient will ambulate in a controlled environment, # of feet with assistance (PT). Flowsheets (Taken 05/19/2022 0940) LTG: Pt will ambulate in controlled environ  assist needed:: Moderate Assistance - Patient 50 - 74% LTG: Ambulation distance in controlled environment: 50 ft using LRAD Note: Slower than expected progress.  Goal: LTG Patient will ambulate in home environment (PT) Description: LTG: Patient will ambulate in home environment, # of feet with assistance (PT). Flowsheets Taken 05/19/2022 0940 LTG: Pt will ambulate in home environ  assist needed:: Moderate Assistance - Patient 50 - 74% Taken 05/06/2022 1808 LTG: Ambulation distance in home environment: up to 40 ft using LRAD Note: Slower than expected progress.

## 2022-05-20 LAB — GLUCOSE, CAPILLARY
Glucose-Capillary: 138 mg/dL — ABNORMAL HIGH (ref 70–99)
Glucose-Capillary: 183 mg/dL — ABNORMAL HIGH (ref 70–99)
Glucose-Capillary: 134 mg/dL — ABNORMAL HIGH (ref 70–99)
Glucose-Capillary: 146 mg/dL — ABNORMAL HIGH (ref 70–99)

## 2022-05-20 NOTE — Progress Notes (Signed)
Speech Language Pathology Discharge Summary  Patient Details  Name: Kara [name] MRN: [id number] Date of Birth: 08/16/1922  Date of Discharge from SLP service:May 21, 2022  {chl ip rehab slp time calculations:304100500}  Skilled Therapeutic Interventions:  ***   Patient has met   of   long term goals.  Patient to discharge at overall   level.  Reasons goals not met:     Clinical Impression/Discharge Summary: Patient has made *** gains and has met *** of *** long-term goals this admission. Patient is currently completing basic, functional cognitive tasks with min-to-mod A cues in regards to orientation, problem solving, memory, and awareness. Max fading to min-to-mod A cues needed for visual scanning. Patient and family education is complete and patient to discharge at overall min-to-mod A level to SNF level of care. Patient would benefit from continued SLP services in SNF setting to maximize cognitive-linguistic skills, swallow function, maximize functional independence, and reduce caregiver burden.   Care Partner:        Recommendation:         Equipment:     Reasons for discharge:          Patty Sermons 05/20/2022, 4:20 PM

## 2022-05-20 NOTE — Progress Notes (Incomplete Revision)
Inpatient Rehabilitation Care Coordinator Discharge Note   Patient Details  Name: Kara [name] MRN: [id number] Date of Birth: 1923-01-13   Discharge location: Home  Length of Stay: 15 Days  Discharge activity level: Max A  Home/community participation: SNF  Patient response DX:AJOINO Literacy - How often do you need to have someone help you when you read instructions, pamphlets, or other written material from your doctor or pharmacy?: Never  Patient response MV:EHMCNO Isolation - How often do you feel lonely or isolated from those around you?: Rarely  Services provided included: MD, RD, PT, OT, SLP, RN, CM, Pharmacy, TR, Neuropsych, SW  Financial Services:  Charity fundraiser Utilized: Medicare    Choices offered to/list presented to: pt and daughter  Follow-up services arranged:              Patient response to transportation need: Is the patient able to respond to transportation needs?: Yes In the past 12 months, has lack of transportation kept you from medical appointments or from getting medications?: No In the past 12 months, has lack of transportation kept you from meetings, work, or from getting things needed for daily living?: No    Comments (or additional information):  Patient/Family verbalized understanding of follow-up arrangements:  Yes  Individual responsible for coordination of the follow-up plan: [name]  Confirmed correct DME delivered: Dyanne Iha 05/20/2022    Dyanne Iha

## 2022-05-20 NOTE — Progress Notes (Signed)
Physical Therapy Discharge Summary  Patient Details  Name: [name] MRN: [id number] Date of Birth: [birth year]  Date of Discharge from PT service:May 21, 2022  {CHL IP REHAB PT TIME CALCULATION:304800500}   Patient has met {NUMBERS 0-12:18577} of {NUMBERS 0-12:18577} long term goals due to {due CL:2751700}.  Patient to discharge at [facility] level {LOA:3049010}.   Patient's care partner {care partner:3041650} to provide the necessary {assistance:3041652} assistance at discharge.  Reasons goals not met: ***  Recommendation:  Patient will benefit from ongoing skilled PT services in {setting:3041680} to continue to advance safe functional mobility, address ongoing impairments in ***, and minimize fall risk.  Equipment: {equipment:3041657}  Reasons for discharge: {Reason for discharge:3049018}  Patient/family agrees with progress made and goals achieved: {Pt/Family agree with progress/goals:3049020}  PT Discharge Precautions/Restrictions   Vital Signs Therapy Vitals Temp: 98.1 F (36.7 C) Temp Source: Oral Pulse Rate: 79 Resp: 16 BP: (!) 141/71 Patient Position (if appropriate): Lying Oxygen Therapy SpO2: 97 % O2 Device: Room Air Pain   Pain Interference   Vision/Perception     Cognition Orientation Level: Oriented to person;Oriented to place Sensation   Motor     Mobility   Locomotion     Trunk/Postural Assessment     Balance   Extremity Assessment            Alger Simons 05/20/2022, 3:07 PM

## 2022-05-20 NOTE — Progress Notes (Signed)
Inpatient Rehabilitation Care Coordinator Discharge Note   Patient Details  Name: Kara [name] MRN: [id number] Date of Birth: May 20, 1922   Discharge location: Home  Length of Stay: 15 Days  Discharge activity level: Max A  Home/community participation: SNF  Patient response PN:TIRWER Literacy - How often do you need to have someone help you when you read instructions, pamphlets, or other written material from your doctor or pharmacy?: Never  Patient response XV:QMGQQP Isolation - How often do you feel lonely or isolated from those around you?: Rarely  Services provided included: MD, RD, PT, OT, SLP, RN, CM, Pharmacy, TR, Neuropsych, SW  Financial Services:  Charity fundraiser Utilized: Medicare    Choices offered to/list presented to: pt and daughter  Follow-up services arranged:              Patient response to transportation need: Is the patient able to respond to transportation needs?: Yes In the past 12 months, has lack of transportation kept you from medical appointments or from getting medications?: No In the past 12 months, has lack of transportation kept you from meetings, work, or from getting things needed for daily living?: No    Comments (or additional information):  Patient/Family verbalized understanding of follow-up arrangements:  Yes  Individual responsible for coordination of the follow-up plan: [name]  Confirmed correct DME delivered: Dyanne Iha 05/20/2022    Dyanne Iha

## 2022-05-20 NOTE — Progress Notes (Signed)
PROGRESS NOTE   Subjective/Complaints:  No c/o today, caregiver not at bedside   ROS- limited by communication/mood  Objective:   DG CHEST PORT 1 VIEW  Result Date: 05/18/2022 CLINICAL DATA:  Confusion EXAM: PORTABLE CHEST 1 VIEW COMPARISON:  06/09/2019 and prior radiographs FINDINGS: Cardiomegaly and LEFT pacemaker again identified. There is no evidence of focal airspace disease, pulmonary edema, suspicious pulmonary nodule/mass, pleural effusion, or pneumothorax. No acute bony abnormalities are identified. IMPRESSION: Cardiomegaly without acute cardiopulmonary disease. Electronically Signed   By: Margarette Canada M.D.   On: 05/18/2022 11:41   Recent Labs    05/19/22 0703  WBC 6.6  HGB 13.5  HCT 39.8  PLT 202     Recent Labs    05/19/22 0703  NA 137  K 3.5  CL 97*  CO2 28  GLUCOSE 165*  BUN 14  CREATININE 0.92  CALCIUM 8.8*      Intake/Output Summary (Last 24 hours) at 05/20/2022 0738 Last data filed at 05/19/2022 1314 Gross per 24 hour  Intake 120 ml  Output --  Net 120 ml         Physical Exam: Vital Signs Blood pressure (!) 131/57, pulse 74, temperature 97.6 F (36.4 C), temperature source Axillary, resp. rate 18, height '5\' 1"'$  (1.549 m), weight 52.5 kg, SpO2 93 %.   General: No acute distress Mood and affect are appropriate Heart: Regular rate and rhythm no rubs murmurs or extra sounds Lungs: Clear to auscultation, breathing unlabored, no rales or wheezes Abdomen: Positive bowel sounds, soft nontender to palpation, nondistended Extremities: No clubbing, cyanosis, or edema   Neurologic: severe L neglect and homonymous hemianopsia , motor strength is 5/5 in right and 3-/5deltoid, bicep, tricep, grip, hip flexor, knee extensors, ankle dorsiflexor and plantar flexor Sensory exam reduced sensation to LT in LUE > LLE, wince to pinch   Musculoskeletal: ,No pain with left ankle ROM, no pain over the  Achilles, no heel tenderness  Assessment/Plan: 1. Functional deficits which require 3+ hours per day of interdisciplinary therapy in a comprehensive inpatient rehab setting. Physiatrist is providing close team supervision and 24 hour management of active medical problems listed below. Physiatrist and rehab team continue to assess barriers to discharge/monitor patient progress toward functional and medical goals  Care Tool:  Bathing    Body parts bathed by patient: Right arm, Left arm, Chest, Abdomen, Front perineal area, Right upper leg, Left upper leg, Right lower leg, Left lower leg   Body parts bathed by helper: Buttocks Body parts n/a: Front perineal area, Buttocks, Right lower leg, Left lower leg, Left upper leg, Left arm, Right arm   Bathing assist Assist Level: Minimal Assistance - Patient > 75%     Upper Body Dressing/Undressing Upper body dressing   What is the patient wearing?: Pull over shirt    Upper body assist Assist Level: Maximal Assistance - Patient 25 - 49%    Lower Body Dressing/Undressing Lower body dressing      What is the patient wearing?: Incontinence brief, Pants     Lower body assist Assist for lower body dressing: Maximal Assistance - Patient 25 - 49%     Toileting Toileting Toileting  Activity did not occur (Clothing management and hygiene only): N/A (no void or bm)  Toileting assist Assist for toileting: 2 Helpers (MAX A +2)     Transfers Chair/bed transfer  Transfers assist     Chair/bed transfer assist level: Minimal Assistance - Patient > 75% (stand pivot to R)     Locomotion Ambulation   Ambulation assist   Ambulation activity did not occur: Safety/medical concerns  Assist level: Moderate Assistance - Patient 50 - 74% (+2 for mirror feedback) Assistive device: Walker-rolling Max distance: 36f   Walk 10 feet activity   Assist  Walk 10 feet activity did not occur: Safety/medical concerns        Walk 50 feet  activity   Assist Walk 50 feet with 2 turns activity did not occur: Safety/medical concerns         Walk 150 feet activity   Assist Walk 150 feet activity did not occur: Safety/medical concerns         Walk 10 feet on uneven surface  activity   Assist Walk 10 feet on uneven surfaces activity did not occur: Safety/medical concerns         Wheelchair     Assist Is the patient using a wheelchair?: Yes Type of Wheelchair: Manual Wheelchair activity did not occur: Safety/medical concerns         Wheelchair 50 feet with 2 turns activity    Assist    Wheelchair 50 feet with 2 turns activity did not occur: Safety/medical concerns       Wheelchair 150 feet activity     Assist  Wheelchair 150 feet activity did not occur: Safety/medical concerns       Blood pressure (!) 131/57, pulse 74, temperature 97.6 F (36.4 C), temperature source Axillary, resp. rate 18, height '5\' 1"'$  (1.549 m), weight 52.5 kg, SpO2 93 %.  Medical Problem List and Plan: 1. Functional deficits secondary to right MCA infarction, A2 and P2 occlusion status post TNK/status post thrombectomy             -patient may  shower             -ELOS/Goals: 05/22/22 Min/Mod A goals for ADLs and Mobility   Con't CIR- PT, OT and SLP 2.  Antithrombotics: -DVT/anticoagulation:  SCD             -antiplatelet therapy: Eliquis 2.5 mg BID 3. Diabetic peripheral neuropathy: Discussed [facility] outpatient and daughter would like for her to try- will ask April to schedule. Lidoderm patch.  Tramadol as needed  -Continue tramadol PRN, daughter reports poor response to a nerve pain medication but does not remember name of this medication  -Daughter says medications that caused her nausea in past was cymbalta, discussed trying gabapentin, she would like to hold off unless pain worsens  4. Mood/Behavior/Sleep: Provide emotional support             -antipsychotic agents: N/A 5. Neuropsych/cognition: This patient  IS capable of making decisions on her own behalf. 6. Skin/Wound Care: Routine skin checks Developing gr 1 decub left lateral heel will order PRAFO  7. Fluids/Electrolytes/Nutrition: Routine in and outs with follow-up chemistries 8.  Atrial fibrillation/pacemaker.  continue Lopressor 12.5 mg every 12 hours.  Cardiac rate controlled  Eliquis 2.'5mg'$  BID 9.  Diabetes mellitus.  Hemoglobin A1c 6.9.  SSI. CBG (last 3)  Recent Labs    05/19/22 1623 05/19/22 2110 05/20/22 0520  GLUCAP 152* 110* 138*   05/20/22 controlled   12/31- CBG's  controlled- con't regimen 10.  Dysphagia.  Dysphagia #3 thin liquids.  Follow-up speech therapy -Upgraded to regular/thin 12/24 11.  GERD. continue Protonix  12.  Hypo K+ improved after po supplementation cont to monitor  -BMP tomorrow ordered  12/31- will check labs in AM    Latest Ref Rng & Units 05/19/2022    7:03 AM 05/10/2022    5:43 AM 05/09/2022    5:34 AM  BMP  Glucose 70 - 99 mg/dL 165  133  123   BUN 8 - 23 mg/dL '14  16  11   '$ Creatinine 0.44 - 1.00 mg/dL 0.92  0.83  0.75   Sodium 135 - 145 mmol/L 137  138  138   Potassium 3.5 - 5.1 mmol/L 3.5  4.0  2.9   Chloride 98 - 111 mmol/L 97  99  95   CO2 22 - 32 mmol/L '28  26  31   '$ Calcium 8.9 - 10.3 mg/dL 8.8  8.9  9.1    -Discussed with pharmacy, recommended KCL oral 48mq x2 and recheck tomorrow , appreciate assistance -K+ up to 4.0 on 12/23  13.  Thrush  -s/p diflucan 12/20  -resolved, stop magic mouthwash 14. Moderate malnutrition  -Consult dietician 15. Lower extremity edema: resolved, placed order to place SCDs at night for circulation. 16. UTI: keflex completed 17. Confusion  Resolved CXR neg for acute process 18. Depression-crying, PBA also in differential   12/31- will try starting Celexa 10 mg daily for mood.        LOS: 15 days A FACE TO FClearmontE Waldon Sheerin 05/20/2022, 7:38 AM

## 2022-05-20 NOTE — Progress Notes (Signed)
Physical Therapy Session Note  Patient Details  Name: Kara [name] MRN: [id number] Date of Birth: April 22, 1923  Today's Date: 05/20/2022 PT Individual Time: 1445-1536 PT Individual Time Calculation (min): 51 min  and Today's Date: 05/20/2022 PT Missed Time: 24 Minutes Missed Time Reason: Patient fatigue  Short Term Goals: Week 2:  PT Short Term Goal 1 (Week 2): Pt will perform bed mobility with min assist using bed features if needed PT Short Term Goal 2 (Week 2): Pt will perform sit<>stands using LRAD with min assist PT Short Term Goal 3 (Week 2): Pt will perform bed<>chair transfers using LRAD with consistent mod assist PT Short Term Goal 4 (Week 2): Pt will ambulate at least 57ft using LRAD with mod assist of 1 PT Short Term Goal 5 (Week 2): Pt will maintain static standing balance using LRAD for 1 minute with no more than min assist   Skilled Therapeutic Interventions/Progress Updates:  Patient supine in bed on entrance to room. Pt dressed in gown for bed. Patient alert and agreeable to PT session as long she remains in room and in bed.    Patient with no pain complaint at start of session.  Neuromuscular Re-ed/ Therex: NMR facilitated during session with focus on BLE muscle activation and fiber recruitment. Pt guided in supine SLR, LE flexion into 90/90 followed by extensor push, SL hip abd/ add against min resistance, ankle mobility. All performed 2x15 with AROM. NMR performed for improvements in motor control and coordination, balance, sequencing, judgement, and self confidence/ efficacy in performing all aspects of mobility at highest level of independence.   Therapeutic Activity: Bed Mobility: Pt performed scoot toward [facility] with use of BLE.  VC/ tc required for bringing LLE into hooklying position and applying downward pressure for appropriate push to [facility]. Still requires MaxA to move to [facility] for improved positioning. .  Patient supine in bed at end of session with brakes locked, bed  alarm set, and all needs within reach.   Therapy Documentation Precautions:  Precautions Precautions: Fall Precaution Comments: L hemipareisis with L neglect, intermittent pusher Restrictions Weight Bearing Restrictions: No General:   Vital Signs: Therapy Vitals Temp: 98.1 F (36.7 C) Temp Source: Oral Pulse Rate: 79 Resp: 16 BP: (!) 141/71 Patient Position (if appropriate): Lying Oxygen Therapy SpO2: 97 % O2 Device: Room Air Pain:  No complaint of pain this session.  Therapy/Group: Individual Therapy  Loel Dubonnet PT, DPT, CSRS 05/20/2022, 3:01 PM

## 2022-05-20 NOTE — Progress Notes (Signed)
Occupational Therapy Session Note  Patient Details  Name: Kara [name] MRN: [id number] Date of Birth: May 08, 1923  Today's Date: 05/20/2022 OT Individual Time: 9024-0973 session 1  OT Individual Time Calculation (min): 61 min  Session 2: 5329-9242   Short Term Goals: Week 2:  OT Short Term Goal 1 (Week 2): Pt will don pull over shirt with mod A. OT Short Term Goal 2 (Week 2): Pt will don pants over feet with mod A. OT Short Term Goal 3 (Week 2): Pt will be able to hold static stand with min A so caregiver can adjust clothing over hips. OT Short Term Goal 4 (Week 2): Pt will complete toilet transfers with min A.  Skilled Therapeutic Interventions/Progress Updates:  Session 1: Pt greeted supine in bed, pt agreeable to OT intervention. Session focus on BADL reeducation, functional mobility, dynamic standing balance, LUE NMR and decreasing overall caregiver burden.         Pt completed supine>sit with MINA to L side of bed. Pt donned pants from EOB with MAX A, pt did assist with leaning forward to thread pants from EOB but d/t impaired motor planning in LUE pt needed assistance. Pt stood with MIN a while caregiver pulled pants to waist line and pt assisted on R side. Pt completed stand pivot to L side with MIN face to face assist.   Total A transport to gym. Pt completed short distance functional ambulation in gym 2x4 ft with RW and MINA, pt initially had a hard time swinging LLE through but able to do so with + time and effort, assisted needed to manage Rw and to maintain midline posture as pt leans to L side.   Worked on LUE NMR with pt instructed to lateral lean to L forearm while RUE crossed midline to retrieve cones, pt then instructed to push through LUE to return to midline. Pt completed ~ 5 trials until reporting pain in back. Worked on motor planning with LUE with pt retrieving cones with LUE. Pt with impaired motor planning noted but able to complete task with + time and MIN cues.   Pt  with moment of nausea when getting back to w/c but quickly passes once pt sitting down.      Ended session with pt seated in w/c with all needs within reach and caregiver present assisting pt with ensure.       Session 2: Pt greeted seated in w/c, with caregiver present, pt agreeable to OT intervention. Total A transport to gym in w/c. Pt completed stand pivot to EOM with Rw and MINA. Pt needs MOD cues for pivotal steps as pt with difficulty determining R vs L foot. Pt also needed assist for RW mgmt.      Once EOM worked on Hooker with LUE positioned on therapy ball and pt instructed to rotate shoulders towards LUE to provide WB'ing to LUE. Pt completed task with CGA with MIN cues for body mechanics. Pt also completed various LUE NMR tasks from sidelying with pt rolling therapy ball forward/backwards to facilitate scapular protraction/retraction. Pt completed NMR tasks from supine as well as such as holding ball with BUEs and completing elbow flexion/extension as well as shoulder flexion/extension.   Worked on targeted reach/motor planning with pt lying in supine and passing cone from R hand to L hand. Pt with impaired motor planning in LUE as pt often dropping cone but able to complete task with MAX cues and increased time to problem solve. Pt transported back  to room and reports need to void bladder, pt completed stand pivot to toilet with Rw with MOD A and MOD cues for pivotal steps and managing RW. Pt with + urine void, pt able to stand with MINA and complete pericare, MAX A for clothing mgmt in standing. MOD A to pivot back to w/c.   Ended session with pt seated in w/c with all needs within reach and NT present.                Therapy Documentation Precautions:  Precautions Precautions: Fall Precaution Comments: L hemipareisis with L neglect, intermittent pusher Restrictions Weight Bearing Restrictions: No  Pain: unrated pain in back, rest breaks, repositioning, and pain patch applied by  nursing for pain mgmt.     Therapy/Group: Individual Therapy  Corinne Ports [facility] 05/20/2022, 12:04 PM

## 2022-05-21 ENCOUNTER — Ambulatory Visit: Payer: Medicare Other | Admitting: Internal Medicine

## 2022-05-21 ENCOUNTER — Other Ambulatory Visit (HOSPITAL_COMMUNITY): Payer: Self-pay

## 2022-05-21 LAB — GLUCOSE, CAPILLARY
Glucose-Capillary: 139 mg/dL — ABNORMAL HIGH (ref 70–99)
Glucose-Capillary: 168 mg/dL — ABNORMAL HIGH (ref 70–99)
Glucose-Capillary: 133 mg/dL — ABNORMAL HIGH (ref 70–99)
Glucose-Capillary: 164 mg/dL — ABNORMAL HIGH (ref 70–99)

## 2022-05-21 MED ORDER — PANTOPRAZOLE SODIUM 40 MG PO TBEC
40.0000 mg | DELAYED_RELEASE_TABLET | Freq: Every day | ORAL | 0 refills | Status: DC
  Filled 2022-05-21: qty 30, 30d supply, fill #0

## 2022-05-21 MED ORDER — METOPROLOL TARTRATE 25 MG PO TABS
12.5000 mg | ORAL_TABLET | Freq: Two times a day (BID) | ORAL | 0 refills | Status: DC
  Filled 2022-05-21: qty 30, 30d supply, fill #0

## 2022-05-21 MED ORDER — ADULT MULTIVITAMIN W/MINERALS CH: 1.0000 | ORAL_TABLET | Freq: Every day | ORAL | Status: DC

## 2022-05-21 MED ORDER — ZINC OXIDE 11.3 % EX CREA
1.0000 | TOPICAL_CREAM | Freq: Two times a day (BID) | CUTANEOUS | 0 refills | Status: DC
  Filled 2022-05-21: qty 340, 170d supply, fill #0

## 2022-05-21 MED ORDER — ESTRADIOL 0.1 MG/GM VA CREA
1.0000 | TOPICAL_CREAM | VAGINAL | 0 refills | Status: DC | PRN
  Filled 2022-05-21: qty 42.5, fill #0

## 2022-05-21 MED ORDER — MELATONIN 3 MG PO TABS
3.0000 mg | ORAL_TABLET | Freq: Every day | ORAL | 0 refills | Status: DC
  Filled 2022-05-21: qty 30, 30d supply, fill #0

## 2022-05-21 MED ORDER — VITAMIN D 50 MCG (2000 UT) PO CAPS
1.0000 | ORAL_CAPSULE | Freq: Every day | ORAL | 0 refills | Status: DC
  Filled 2022-05-21: qty 30, 30d supply, fill #0

## 2022-05-21 MED ORDER — TRAMADOL HCL 50 MG PO TABS
50.0000 mg | ORAL_TABLET | Freq: Two times a day (BID) | ORAL | 0 refills | Status: DC | PRN
  Filled 2022-05-21: qty 30, 15d supply, fill #0

## 2022-05-21 MED ORDER — CITALOPRAM HYDROBROMIDE 10 MG PO TABS
10.0000 mg | ORAL_TABLET | Freq: Every day | ORAL | 0 refills | Status: DC
  Filled 2022-05-21: qty 30, 30d supply, fill #0

## 2022-05-21 MED ORDER — APIXABAN 2.5 MG PO TABS
2.5000 mg | ORAL_TABLET | Freq: Two times a day (BID) | ORAL | 0 refills | Status: DC
  Filled 2022-05-21: qty 60, 30d supply, fill #0

## 2022-05-21 MED ORDER — LIDOCAINE 5 % EX PTCH
1.0000 | MEDICATED_PATCH | Freq: Every day | CUTANEOUS | 0 refills | Status: DC | PRN
  Filled 2022-05-21: qty 30, 30d supply, fill #0

## 2022-05-21 MED ORDER — ACETAMINOPHEN 325 MG PO TABS: 650.0000 mg | ORAL_TABLET | ORAL | Status: DC | PRN

## 2022-05-21 NOTE — Plan of Care (Signed)
Problem: RH Cognition - SLP Goal: RH LTG Patient will demonstrate orientation with cues Description:  LTG:  Patient will demonstrate orientation to person/place/time/situation with cues (SLP)   Outcome: Not Met (add Reason)   Problem: RH Awareness Goal: LTG: Patient will demonstrate awareness during functional activites type of (SLP) Description: LTG: Patient will demonstrate awareness during functional activites type of (SLP) Outcome: Not Met (add Reason)   Problem: RH Swallowing Goal: LTG Pt will demonstrate functional change in swallow as evidenced by bedside/clinical objective assessment (SLP) Description: LTG: Patient will demonstrate functional change in swallow as evidenced by bedside/clinical objective assessment (SLP) Outcome: Completed/Met   Problem: RH Problem Solving Goal: LTG Patient will demonstrate problem solving for (SLP) Description: LTG:  Patient will demonstrate problem solving for basic/complex daily situations with cues  (SLP) Outcome: Completed/Met   Problem: RH Memory Goal: LTG Patient will use memory compensatory aids to (SLP) Description: LTG:  Patient will use memory compensatory aids to recall biographical/new, daily complex information with cues (SLP) Outcome: Completed/Met   Problem: RH Expression Communication Goal: LTG Patient will increase speech intelligibility (SLP) Description: LTG: Patient will increase speech intelligibility at word/phrase/conversation level with cues, % of the time (SLP) Outcome: Completed/Met

## 2022-05-21 NOTE — Progress Notes (Signed)
Patient ID: Kara [name], female   DOB: 1922-06-06, 89 y.o.   MRN: [id number]  Transport arranged for 10 AM on Thursday. Room#332. Number for report [phone].

## 2022-05-21 NOTE — Plan of Care (Signed)
  Problem: RH Dressing Goal: LTG Patient will perform upper body dressing (OT) Description: LTG Patient will perform upper body dressing with assist, with/without cues (OT). Outcome: Adequate for Discharge Goal: LTG Patient will perform lower body dressing w/assist (OT) Description: LTG: Patient will perform lower body dressing with assist, with/without cues in positioning using equipment (OT) Outcome: Adequate for Discharge   Problem: RH Toileting Goal: LTG Patient will perform toileting task (3/3 steps) with assistance level (OT) Description: LTG: Patient will perform toileting task (3/3 steps) with assistance level (OT)  Outcome: Adequate for Discharge   Problem: RH Tub/Shower Transfers Goal: LTG Patient will perform tub/shower transfers w/assist (OT) Description: LTG: Patient will perform tub/shower transfers with assist, with/without cues using equipment (OT) Outcome: Adequate for Discharge   Problem: RH Balance Goal: LTG: Patient will maintain dynamic sitting balance (OT) Description: LTG:  Patient will maintain dynamic sitting balance with assistance during activities of daily living (OT) Outcome: Completed/Met Goal: LTG Patient will maintain dynamic standing with ADLs (OT) Description: LTG:  Patient will maintain dynamic standing balance with assist during activities of daily living (OT)  Outcome: Completed/Met   Problem: Sit to Stand Goal: LTG:  Patient will perform sit to stand in prep for activites of daily living with assistance level (OT) Description: LTG:  Patient will perform sit to stand in prep for activites of daily living with assistance level (OT) Outcome: Completed/Met   Problem: RH Eating Goal: LTG Patient will perform eating w/assist, cues/equip (OT) Description: LTG: Patient will perform eating with assist, with/without cues using equipment (OT) Outcome: Completed/Met   Problem: RH Grooming Goal: LTG Patient will perform grooming w/assist,cues/equip  (OT) Description: LTG: Patient will perform grooming with assist, with/without cues using equipment (OT) Outcome: Completed/Met   Problem: RH Bathing Goal: LTG Patient will bathe all body parts with assist levels (OT) Description: LTG: Patient will bathe all body parts with assist levels (OT) Outcome: Completed/Met   Problem: RH Functional Use of Upper Extremity Goal: LTG Patient will use RT/LT upper extremity as a (OT) Description: LTG: Patient will use right/left upper extremity as a stabilizer/gross assist/diminished/nondominant/dominant level with assist, with/without cues during functional activity (OT) Outcome: Completed/Met   Problem: RH Toilet Transfers Goal: LTG Patient will perform toilet transfers w/assist (OT) Description: LTG: Patient will perform toilet transfers with assist, with/without cues using equipment (OT) Outcome: Completed/Met   Problem: RH Attention Goal: LTG Patient will demonstrate this level of attention during functional activites (OT) Description: LTG:  Patient will demonstrate this level of attention during functional activites  (OT) Outcome: Completed/Met

## 2022-05-21 NOTE — Progress Notes (Signed)
Patient ID: Kara [name], female   DOB: 05/14/1923, 89 y.o.   MRN: [id number]  SW received follow up from AD. Facility will be ready for patient after 10 AM. AD will meet with daughter to complete paperwork. Patient transferring by PTAR. Transport will be arranged for 10 AM. Daughter will pick up belongings and follow for transfer no additional questions or concerns.

## 2022-05-21 NOTE — NC FL2 (Signed)
[place] LEVEL OF CARE FORM     IDENTIFICATION  Patient Name: Kara [name] Birthdate: 10-Jan-1923 Sex: female Admission Date (Current Location): 05/05/2022  [facility] and [place] Number:  Engineering geologist and Address:  [facility], [place] [address], [place], [place] [address]      Provider Number:    Attending Physician Name and Address:  Charlett Blake, MD  Relative Name and Phone Number:  Rosana Hoes    Current Level of Care: Hospital Recommended Level of Care: [place] Prior Approval Number:    Date Approved/Denied:   PASRR Number: [id number]  Discharge Plan: SNF    Current Diagnoses: Patient Active Problem List   Diagnosis Date Noted   Right middle cerebral artery stroke ([place]) 05/05/2022   Acute ischemic stroke ([place]) 05/01/2022   Vulvar irritation 04/22/2022   Lower abdominal pain 08/14/2021   Pain in left upper arm 08/14/2021   Dystrophic nail 08/14/2021   Urinary frequency 02/16/2021   Low back pain, non-specific 11/14/2020   Compression fx, lumbar spine, sequela 11/13/2020   Lumbar spinal stenosis 11/08/2020   Traumatic hemorrhage of cerebrum ([place]) 07/31/2020   History of recent fall 07/30/2020   Intraparenchymal hemorrhage of brain ([place]) 07/13/2020   Syncope and collapse 07/13/2020   Hypokalemia 07/13/2020   Atrial fibrillation, chronic ([place]) 07/13/2020   DNR (do not resuscitate) 07/13/2020   S/P placement of cardiac pacemaker 07/03/2020   Myalgia due to statin 07/03/2020   Aortic atherosclerosis ([place]) 07/03/2020   Persistent atrial fibrillation ([place]) 08/12/2019   Fracture of neck of femur ([place]) 06/27/2019   Displaced intertrochanteric fracture of left femur, sequela 06/25/2019   Age-related osteoporosis with current pathological fracture with routine healing 06/15/2019   Educated about COVID-19 virus infection 10/12/2018   Dysuria 02/02/2018   Edema of lower extremity 11/24/2017    Venous insufficiency of both lower extremities 11/24/2016   Essential hypertension 11/11/2016   Peripheral artery disease ([place]) 11/11/2016   Chronic pulmonary hypertension ([place]) 10/20/2016   Leg swelling 06/17/2016   Type 2 diabetes mellitus with diabetic neuropathy, unspecified ([place]) 06/17/2016   Urinary incontinence, urge 12/27/2015   Atrophic vaginitis 09/26/2015   Severe tricuspid valve insufficiency 07/04/2015   Carpal tunnel syndrome of right wrist 05/29/2015   Chronic fatigue 05/01/2015   E. coli UTI 11/21/2014   Urethral caruncle 11/21/2014   Mitral valve regurgitation 05/09/2014   Malaise and fatigue 03/09/2014   SSS (sick sinus syndrome) ([place]) 10/11/2013   CAD (coronary artery disease) 10/11/2013   Osteoarthritis 03/08/2012   Screening for breast cancer 03/08/2012   Pernicious anemia 02/04/2012   History of colectomy 02/04/2012   Vitamin D deficiency 07/30/2011   Acquired thrombophilia ([place]) 06/13/2011   Coronary atherosclerosis due to calcified coronary lesion 06/13/2011   Chronic diarrhea 06/13/2011    Orientation RESPIRATION BLADDER Height & Weight     Self, Time, Place  Normal Continent Weight: 115 lb 11.9 oz (52.5 kg) Height:  '5\' 1"'$  (154.9 cm)  BEHAVIORAL SYMPTOMS/MOOD NEUROLOGICAL BOWEL NUTRITION STATUS      Continent Diet (Reg diet, thin liqs)  AMBULATORY STATUS COMMUNICATION OF NEEDS Skin   Limited Assist   Normal                       Personal Care Assistance Level of Assistance  Bathing, Feeding, Dressing Bathing Assistance: Limited assistance Feeding assistance: Limited assistance Dressing Assistance: Limited assistance     Functional Limitations Info  SPECIAL CARE FACTORS FREQUENCY  PT (By licensed PT), OT (By licensed OT), Speech therapy     PT Frequency: 5x a week OT Frequency: 5x a week     Speech Therapy Frequency: 3x a week      Contractures      Additional Factors Info  Code Status Code Status Info: DNR              Current Medications (05/21/2022):  This is the current hospital active medication list Current Facility-Administered Medications  Medication Dose Route Frequency Provider Last Rate Last Admin   acetaminophen (TYLENOL) tablet 650 mg  650 mg Oral Q4H PRN Cathlyn Parsons, PA-C   650 mg at 05/16/22 2155   Or   acetaminophen (TYLENOL) 160 MG/5ML solution 650 mg  650 mg Per Tube Q4H PRN Cathlyn Parsons, PA-C   650 mg at 05/14/22 1332   Or   acetaminophen (TYLENOL) suppository 650 mg  650 mg Rectal Q4H PRN Angiulli, Lavon Paganini, PA-C       apixaban Arne [place]) tablet 2.5 mg  2.5 mg Oral BID AngiulliLavon Paganini, PA-C   2.5 mg at 05/21/22 7616   citalopram (CELEXA) tablet 10 mg  10 mg Oral Daily Lovorn, Megan, MD   10 mg at 05/21/22 0737   estradiol (ESTRACE) vaginal cream 1 Applicatorful  1 Applicatorful Vaginal T06Y PRN Angiulli, Lavon Paganini, PA-C       feeding supplement (ENSURE ENLIVE / ENSURE PLUS) liquid 237 mL  237 mL Oral BID BM Kirsteins, Luanna Salk, MD   237 mL at 05/21/22 1337   insulin aspart (novoLOG) injection 0-9 Units  0-9 Units Subcutaneous TID AC & HS AngiulliLavon Paganini, PA-C   2 Units at 05/21/22 1337   lidocaine (LIDODERM) 5 % 1 patch  1 patch Transdermal Daily PRN Cathlyn Parsons, PA-C   1 patch at 05/21/22 1337   melatonin tablet 3 mg  3 mg Oral QHS Street, [place], [place]   3 mg at 05/20/22 2116   metoprolol tartrate (LOPRESSOR) tablet 12.5 mg  12.5 mg Oral Q12H AngiulliLavon Paganini, PA-C   12.5 mg at 05/21/22 6948   multivitamin with minerals tablet 1 tablet  1 tablet Oral Daily Charlett Blake, MD   1 tablet at 05/21/22 0834   ondansetron (ZOFRAN-ODT) disintegrating tablet 4 mg  4 mg Oral Q8H PRN Cathlyn Parsons, PA-C   4 mg at 05/14/22 2120   pantoprazole (PROTONIX) EC tablet 40 mg  40 mg Oral QHS Charlett Blake, MD   40 mg at 05/20/22 2116   senna-docusate (Senokot-S) tablet 1 tablet  1 tablet Oral QHS PRN Cathlyn Parsons, PA-C       traMADol Veatrice Bourbon)  tablet 50 mg  50 mg Oral Q12H PRN Cathlyn Parsons, PA-C   50 mg at 05/18/22 1949   traZODone (DESYREL) tablet 25 mg  25 mg Oral QHS PRN Street, [place], PA-C       zinc oxide (BALMEX) 11.3 % cream   Topical BID Cathlyn Parsons, PA-C   Given at 05/21/22 5462     Discharge Medications: Please see discharge summary for a list of discharge medications.  Relevant Imaging Results:  Relevant Lab Results:   Additional Information [id number]  Dyanne Iha

## 2022-05-21 NOTE — Progress Notes (Signed)
Patient ID: Kara [name], female   DOB: 02/02/1923, 89 y.o.   MRN: [id number]  Team Conference Report to Patient/Family  Team Conference discussion was reviewed with the patient and caregiver, including goals, any changes in plan of care and target discharge date.  Patient and caregiver express understanding and are in agreement.  The patient has a target discharge date of 05/22/22.  Sw met with patient and daughter in room to provide conference updates. Patient ready for transfer to SNF tomorrow. Patient will transfer by PTAR. Clinicals sent on hub, Sw will arrange transportation in AM. Daughter will take belongings. No additional questions or concerns.   Dyanne Iha 05/21/2022, 2:19 PM

## 2022-05-21 NOTE — Progress Notes (Signed)
PROGRESS NOTE   Subjective/Complaints:  No issues overnite, no foot pain c/o during therapy , daughter at bedside wondering about qutenza procedure scheduled.  Pt without foot pain today   ROS- limited by communication/mood  Objective:   No results found. Recent Labs    05/19/22 0703  WBC 6.6  HGB 13.5  HCT 39.8  PLT 202     Recent Labs    05/19/22 0703  NA 137  K 3.5  CL 97*  CO2 28  GLUCOSE 165*  BUN 14  CREATININE 0.92  CALCIUM 8.8*      Intake/Output Summary (Last 24 hours) at 05/21/2022 1139 Last data filed at 05/20/2022 1746 Gross per 24 hour  Intake 120 ml  Output --  Net 120 ml         Physical Exam: Vital Signs Blood pressure 139/75, pulse 66, temperature 98.2 F (36.8 C), resp. rate 16, height '5\' 1"'$  (1.549 m), weight 52.5 kg, SpO2 94 %.   General: No acute distress Mood and affect are appropriate Heart: Regular rate and rhythm no rubs murmurs or extra sounds Lungs: Clear to auscultation, breathing unlabored, no rales or wheezes Abdomen: Positive bowel sounds, soft nontender to palpation, nondistended Extremities: No clubbing, cyanosis, or edema   Neurologic: severe L neglect and homonymous hemianopsia , motor strength is 5/5 in right and 3-/5deltoid, bicep, tricep, grip, hip flexor, knee extensors, ankle dorsiflexor and plantar flexor Sensory exam reduced sensation to LT in LUE > LLE, wince to pinch   Musculoskeletal: ,No pain with left ankle ROM, no pain over the Achilles, no heel tenderness  Assessment/Plan: 1. Functional deficits which require 3+ hours per day of interdisciplinary therapy in a comprehensive inpatient rehab setting. Physiatrist is providing close team supervision and 24 hour management of active medical problems listed below. Physiatrist and rehab team continue to assess barriers to discharge/monitor patient progress toward functional and medical goals  Care  Tool:  Bathing    Body parts bathed by patient: Right arm, Left arm, Chest, Abdomen, Front perineal area, Right upper leg, Left upper leg, Right lower leg, Left lower leg   Body parts bathed by helper: Buttocks Body parts n/a: Front perineal area, Buttocks, Right lower leg, Left lower leg, Left upper leg, Left arm, Right arm   Bathing assist Assist Level: Minimal Assistance - Patient > 75%     Upper Body Dressing/Undressing Upper body dressing   What is the patient wearing?: Pull over shirt    Upper body assist Assist Level: Maximal Assistance - Patient 25 - 49%    Lower Body Dressing/Undressing Lower body dressing      What is the patient wearing?: Incontinence brief, Pants     Lower body assist Assist for lower body dressing: Maximal Assistance - Patient 25 - 49%     Toileting Toileting Toileting Activity did not occur (Clothing management and hygiene only): N/A (no void or bm)  Toileting assist Assist for toileting: 2 Helpers (MAX A +2)     Transfers Chair/bed transfer  Transfers assist     Chair/bed transfer assist level: Minimal Assistance - Patient > 75% (stand pivot to R)     Locomotion Ambulation  Ambulation assist   Ambulation activity did not occur: Safety/medical concerns  Assist level: Moderate Assistance - Patient 50 - 74% (+2 for mirror feedback) Assistive device: Walker-rolling Max distance: 36f   Walk 10 feet activity   Assist  Walk 10 feet activity did not occur: Safety/medical concerns        Walk 50 feet activity   Assist Walk 50 feet with 2 turns activity did not occur: Safety/medical concerns         Walk 150 feet activity   Assist Walk 150 feet activity did not occur: Safety/medical concerns         Walk 10 feet on uneven surface  activity   Assist Walk 10 feet on uneven surfaces activity did not occur: Safety/medical concerns         Wheelchair     Assist Is the patient using a wheelchair?:  Yes Type of Wheelchair: Manual Wheelchair activity did not occur: Safety/medical concerns         Wheelchair 50 feet with 2 turns activity    Assist    Wheelchair 50 feet with 2 turns activity did not occur: Safety/medical concerns       Wheelchair 150 feet activity     Assist  Wheelchair 150 feet activity did not occur: Safety/medical concerns       Blood pressure 139/75, pulse 66, temperature 98.2 F (36.8 C), resp. rate 16, height '5\' 1"'$  (1.549 m), weight 52.5 kg, SpO2 94 %.  Medical Problem List and Plan: 1. Functional deficits secondary to right MCA infarction, A2 and P2 occlusion status post TNK/status post thrombectomy             -patient may  shower             -ELOS/Goals: 05/22/22 Min/Mod A goals for ADLs and Mobility   Con't CIR- PT, OT and SLP 2.  Antithrombotics: -DVT/anticoagulation:  SCD             -antiplatelet therapy: Eliquis 2.5 mg BID 3. Diabetic peripheral neuropathy: daughter not sure she wants to pursue Quetneza treatment for neuropathy  Lidoderm patch.  Tramadol as needed  -Continue tramadol PRN, although pt has not requested for >3d  -Daughter says medications that caused her nausea in past was cymbalta, discussed trying gabapentin, she would like to hold off unless pain worsens  4. Mood/Behavior/Sleep: Provide emotional support             -antipsychotic agents: N/A 5. Neuropsych/cognition: This patient IS capable of making decisions on her own behalf. 6. Skin/Wound Care: Routine skin checks Developing gr 1 decub left lateral heel will order PRAFO  7. Fluids/Electrolytes/Nutrition: Routine in and outs with follow-up chemistries 8.  Atrial fibrillation/pacemaker.  continue Lopressor 12.5 mg every 12 hours.  Cardiac rate controlled  Eliquis 2.'5mg'$  BID 9.  Diabetes mellitus.  Hemoglobin A1c 6.9.  SSI. CBG (last 3)  Recent Labs    05/20/22 1631 05/20/22 2057 05/21/22 0529  GLUCAP 134* 146* 139*   05/20/22 controlled   12/31- CBG's  controlled- con't regimen 10.  Dysphagia.  Dysphagia #3 thin liquids.  Follow-up speech therapy -Upgraded to regular/thin 12/24 11.  GERD. continue Protonix  12.  Hypo K+ improved after po supplementation cont to monitor  -BMP tomorrow ordered  12/31- will check labs in AM    Latest Ref Rng & Units 05/19/2022    7:03 AM 05/10/2022    5:43 AM 05/09/2022    5:34 AM  BMP  Glucose 70 -  99 mg/dL 165  133  123   BUN 8 - 23 mg/dL '14  16  11   '$ Creatinine 0.44 - 1.00 mg/dL 0.92  0.83  0.75   Sodium 135 - 145 mmol/L 137  138  138   Potassium 3.5 - 5.1 mmol/L 3.5  4.0  2.9   Chloride 98 - 111 mmol/L 97  99  95   CO2 22 - 32 mmol/L '28  26  31   '$ Calcium 8.9 - 10.3 mg/dL 8.8  8.9  9.1    -Discussed with pharmacy, recommended KCL oral 73mq x2 and recheck tomorrow , appreciate assistance -K+ up to 4.0 on 12/23  13.  Thrush  -s/p diflucan 12/20  -resolved, stop magic mouthwash 14. Moderate malnutrition  -Consult dietician 15. Lower extremity edema: resolved, placed order to place SCDs at night for circulation. 16. UTI: keflex completed 17. Confusion  Resolved CXR neg for acute process 18. Depression-crying, PBA also in differential   12/31- will try starting Celexa 10 mg daily for mood.        LOS: 16 days A FACE TO FACE EVALUATION WAS PERFORMED  ACharlett Blake1/07/2022, 11:39 AM

## 2022-05-21 NOTE — Progress Notes (Signed)
Physical Therapy Session Note  Patient Details  Name: Kara [name] MRN: [id number] Date of Birth: 1922-06-23  Today's Date: 05/21/2022 PT Individual Time: 0905-1004 PT Individual Time Calculation (min): 59 min   Short Term Goals: Week 2:  PT Short Term Goal 1 (Week 2): Pt will perform bed mobility with min assist using bed features if needed PT Short Term Goal 2 (Week 2): Pt will perform sit<>stands using LRAD with min assist PT Short Term Goal 3 (Week 2): Pt will perform bed<>chair transfers using LRAD with consistent mod assist PT Short Term Goal 4 (Week 2): Pt will ambulate at least 80ft using LRAD with mod assist of 1 PT Short Term Goal 5 (Week 2): Pt will maintain static standing balance using LRAD for 1 minute with no more than min assist   Skilled Therapeutic Interventions/Progress Updates:  Patient supine in bed on entrance to room. Patient alert and agreeable to PT session.   Patient with no pain complaint at start of session.  Therapeutic Activity: Bed Mobility: Pt performed supine --> sit with extensive vc and MinA for final push up of UB from bed surface with RUE and forward scoot to EOB. VC/ tc required for technique throughout. Dressing performed while seated EOB and requiring ModA overall.  Transfers: Pt performed sit<>stand at EOB to RW with CGA/ MinA for balance. Squat pivot transfer to w/c toward L side with setup and Min/ ModA for technique and positioning. Toilet transfer with pt able to rise to stand with CGA using safety rail. MinA to pivot to toilet. CGA to rise to stand, MaxA for clothing mgmt.   Gait Training:  Pt ambulated 22' x1/ 20' x1 using RW with MinA initially and up to [facility] with increased back pain in R glute. Demonstrated NBOS with LLE adduction and increased L lean. Unable to voluntarily correct requiring MinA for improved L foot positioning. Provided vc/ tc for balance, positioning, technique with need for manual facilitation in weight shift.  Wheelchair  Mobility:  Pt propelled wheelchair 20 feet with up to MinA for hemitechnique. Provided vc for coordination of hand propel with foot advancement.  Neuromuscular Re-ed: NMR facilitated during session with focus onsitting and standing balance. Pt guided in finding midline, maintaining with divided focus and return to midline in both sitting and standing. Up to MinA to correct with fatigue and initiation noted with vc and redirection to focusing attention to task. NMR performed for improvements in motor control and coordination, balance, sequencing, judgement, and self confidence/ efficacy in performing all aspects of mobility at highest level of independence.   Patient seated upright in w/c at end of session with brakes locked, no alarm set as dtr in room providing supervision, and all needs within reach. Tray table provided for LUE support.    Therapy Documentation Precautions:  Precautions Precautions: Fall Precaution Comments: L hemipareisis with L neglect, intermittent pusher Restrictions Weight Bearing Restrictions: No General:   Vital Signs:  Pain:  R glute pain with positioning. RN notified of pt's request for "pain patch".    Therapy/Group: Individual Therapy  Loel Dubonnet PT, DPT, CSRS 05/21/2022, 6:09 PM

## 2022-05-21 NOTE — Progress Notes (Signed)
Occupational Therapy Discharge Summary  Patient Details  Name: Kara [name] MRN: [id number] Date of Birth: May 19, 1923  Date of Discharge from OT service:May 21, 2022   Patient has met 9 of 13 long term goals due to improved activity tolerance, improved balance, postural control, ability to compensate for deficits, functional use of  LEFT upper and LEFT lower extremity, improved attention, improved awareness, and improved coordination.  Patient to discharge at overall Mod Assist level.  Patient's care partner unavailable to provide the necessary physical and cognitive assistance at discharge.  Therefore pt will d/c to a SNF for further therapy.   Reasons goals not met: pt had goal set for min A with UB dressing, but needs mod.  Mod A for LB dressing and shower transfers and toileting but needs max A.  Her L neglect, sensation impairments, and balance and have limited her from being able to be more independent with these skills.   Recommendation:  Patient will benefit from ongoing skilled OT services in skilled nursing facility setting to continue to advance functional skills in the area of BADL.  Equipment: No equipment provided  Reasons for discharge: treatment goals met  Patient/family agrees with progress made and goals achieved: Yes  OT Discharge Precautions/Restrictions  Precautions Precautions: Fall Precaution Comments: L hemipareisis with L neglect, intermittent pusher Restrictions Weight Bearing Restrictions: No  ADL ADL Eating: Supervision/safety Grooming: Supervision/safety Where Assessed-Grooming: Sitting at sink Upper Body Bathing: Moderate assistance Where Assessed-Upper Body Bathing: Sitting at sink Lower Body Bathing: Moderate assistance Where Assessed-Lower Body Bathing: Sitting at sink Upper Body Dressing: Moderate assistance Where Assessed-Upper Body Dressing: Edge of bed Lower Body Dressing: Maximal assistance Where Assessed-Lower Body Dressing: Edge of  bed Toileting: Maximal assistance Where Assessed-Toileting: Glass blower/designer: Moderate assistance Toilet Transfer Method: Stand pivot Tub/Shower Transfer: Unable to assess Tub/Shower Transfer Method: Unable to assess Social research officer, government: Unable to assess Social research officer, government Method: Other (comment) (rollin shower chair) Youth worker: Other (comment) (roll in shower chair) ADL Comments: Pt has made significant progress with her balance which has enabled her to participate more effectively in rehab with ADLs. Vision Baseline Vision/History: 1 Wears glasses Patient Visual Report: Other (comment) (continues to be unawre of visual changes) Vision Assessment?: Yes Eye Alignment: Impaired (comment) Ocular Range of Motion: Restricted on the left Alignment/Gaze Preference: Gaze right (improved from eval but continues to prefer R gaze) Tracking/Visual Pursuits: Decreased smoothness of eye movement to LEFT superior field;Decreased smoothness of eye movement to LEFT inferior field;Requires cues, head turns, or add eye shifts to track Saccades: Additional head turns occurred during testing Visual Fields: Left homonymous hemianopsia Additional Comments: pt has improved occular ROM to her L but continues to have a 90 degree field cut due to L homonymous hemianopsia Perception  Perception: Impaired Inattention/Neglect: Does not attend to left side of body;Does not attend to left visual field Praxis Praxis: Impaired Praxis Impairment Details: Motor planning Cognition Cognition Overall Cognitive Status: Impaired/Different from baseline Arousal/Alertness: Awake/alert Orientation Level: Person;Place;Situation Person: Oriented Place: Oriented Situation: Oriented Memory: Impaired Attention: Sustained;Divided Sustained Attention: Impaired Divided Attention: Impaired Awareness: Impaired Awareness Impairment: Intellectual impairment Problem Solving: Impaired Sequencing:  Impaired Organizing: Impaired Initiating: Impaired (improved from eval) Behaviors: Poor frustration tolerance Safety/Judgment: Impaired Comments: left neglect - intermittently pushes toward left Brief Interview for Mental Status (BIMS) Repetition of Three Words (First Attempt): 3 Temporal Orientation: Year: Missed by more than 5 years Temporal Orientation: Month: Accurate within 5 days Temporal Orientation: Day: Incorrect (Tuesday) Recall: "Sock":  Yes, no cue required Recall: "Blue": Yes, no cue required Recall: "Bed": Yes, no cue required BIMS Summary Score: 11 Sensation Sensation Light Touch: Impaired by gross assessment Hot/Cold: Impaired by gross assessment Proprioception: Impaired by gross assessment Stereognosis: Impaired by gross assessment Additional Comments: severely impaired proprioception and kinesthesia resulting in "alien arm syndrome" Coordination Gross Motor Movements are Fluid and Coordinated: No Fine Motor Movements are Fluid and Coordinated: No Heel Shin Test: initiates with R and visual neglect prevents completion Motor  Motor Motor: Abnormal tone;Abnormal postural alignment and control;Motor impersistence Motor - Discharge Observations: LLE hemipareisis, hemiplegia/ hypotonic LUE Mobility  Bed Mobility Bed Mobility: Rolling Right;Rolling Left;Supine to Sit;Sit to Supine;Right Sidelying to Sit Rolling Right: Minimal Assistance - Patient > 75%;Contact Guard/Touching assist (multimodal cueing for sequencing) Rolling Left: Minimal Assistance - Patient > 75% (cueing for sequencing) Right Sidelying to Sit: Moderate Assistance - Patient 50-74% Supine to Sit: Moderate Assistance - Patient 50-74% Sit to Supine: Minimal Assistance - Patient > 75% Transfers Sit to Stand: Minimal Assistance - Patient > 75% Stand to Sit: Moderate Assistance - Patient 50-74%  Trunk/Postural Assessment    L lean in sitting and standing due to Visual perceptual and sensory deficits.    Balance Static Sitting Balance Static Sitting - Level of Assistance: 4: Min assist;5: Stand by assistance (CGA overall) Dynamic Sitting Balance Dynamic Sitting - Level of Assistance: 4: Min assist;3: Mod assist (fluctuates depending on task) Static Standing Balance Static Standing - Level of Assistance: 4: Min assist Extremity/Trunk Assessment RUE Assessment RUE Assessment: Within Functional Limits LUE Assessment Active Range of Motion (AROM) Comments: shoulder flexion to 100 degrees.  Has made significant improvement, elbow and hand WFL. General Strength Comments: 3+ throughout - pt very limited from functional use of arm due to L neglect and sensory impairments Brunstrum level for arm: Stage III Synergy is performed voluntarily Brunstrum level for hand: Stage III Synergies performed voluntarily   [facility] 05/21/2022, 1:12 PM

## 2022-05-21 NOTE — Progress Notes (Signed)
Occupational Therapy Session Note  Patient Details  Name: Kara [name] MRN: [id number] Date of Birth: 11/11/1922  Today's Date: 05/21/2022 OT Individual Time: 1105-1200 OT Individual Time Calculation (min): 55 min    Short Term Goals: Week 2:  OT Short Term Goal 1 (Week 2): Pt will don pull over shirt with mod A. OT Short Term Goal 2 (Week 2): Pt will don pants over feet with mod A. OT Short Term Goal 3 (Week 2): Pt will be able to hold static stand with min A so caregiver can adjust clothing over hips. OT Short Term Goal 4 (Week 2): Pt will complete toilet transfers with min A.  Skilled Therapeutic Interventions/Progress Updates:    Pt received in wc with her daughter present in the room.  Pt stated she needed to toilet. Pt taken to bathroom via wc and completed stand pivot to toilet with mod A and cues for shifting feet and hips.  Total A with clothing management and cleansing while pt held standing balance with min A. Pt voided bowel. Mod A back to wc.  Pt worked on reassessment and education with pt and her daughter on sensory and visual deficits and adaptations and modification she needs to make, such as fully turning her head to left to find items and looking at L hand to move it functionally.  Reassessed ROM, strength, vision, cognition.   Pt has made significant changes but will need further therapy to reach a level where she can move more safely.    Sit to stand from wc to RW with CGA and then ambulated to bed with min A. Cues for turning feet and hips to sit back to bed.  Min A to lay to sidelying and then to supine.  Pt able to lift and adjust legs on bed.  Adjusted in bed with pillow to support L arm.  Pt in chair position in bed for lunch (pt had asked numerous times during session to lay down).  Bed alarm set and all needs met.   Therapy Documentation Precautions:  Precautions Precautions: Fall Precaution Comments: L hemipareisis with L neglect, intermittent  pusher Restrictions Weight Bearing Restrictions: No Pain: Pain Assessment Pain Scale: 0-10 Pain Score: 0-No pain    Therapy/Group: Individual Therapy  [place] 05/21/2022, 10:16 AM

## 2022-05-21 NOTE — Patient Care Conference (Signed)
Inpatient RehabilitationTeam Conference and Plan of Care Update Date: 05/21/2022   Time: 10:20 AM    Patient Name: Kara [name]      Medical Record Number: [id number]  Date of Birth: [birth year] Sex: Female         Room/Bed: 4W21C/4W21C-01 Payor Info: Payor: MEDICARE / Plan: MEDICARE PART A AND B / Product Type: *No Product type* /    Admit Date/Time:  05/05/2022  4:04 PM  Primary Diagnosis:  Right middle cerebral artery stroke [facility])  Hospital Problems: Principal Problem:   Right middle cerebral artery stroke [facility])    Expected Discharge Date: Expected Discharge Date: 05/22/22  Team Members Present: Physician leading conference: Dr. Alysia Penna Social Worker Present: Erlene Quan, BSW Nurse Present: Dorien Chihuahua, RN PT Present: Barrie Folk, PT OT Present: Meriel Pica, OT SLP Present: Sherren Kerns, SLP PPS Coordinator present : Gunnar Fusi, SLP     Current Status/Progress Goal Weekly Team Focus  Bowel/Bladder   Patient able to use bedpan for bowel and bladder.   Patient will be able to use a bedside commode.   Encourage patient to use bedside commod, and assist patient with transfer.    Swallow/Nutrition/ Hydration   regular diet, thin liquids, sup A   Supervision A  tolerance of current diet, oral awareness and taking small, single sips    ADL's   MIN- MOD A for bathing from shower level in TIS shower chair d/t impaired sitting balance, MAX A for dressing d/t impaired L sided attention and motor planning. pt completed ADL transfer with RW with MINA, MAXA for 3/3 toileting tasks. LUE AROM is improving but continues to have impaired motor planning in LUE. this week pt with frequent BMs and reports moments of nausea, pt also not wanting to be left alone often. pt reports feeling like she cant make any simple decisions often asking her cargiver what she wants. limited by pain often in back   Mod/min assist   pain mgmt, transfer trainingm LUE NMR, L  attention, visual scanning,    Mobility   Bed mobility = MinA up to [place] (one instance of CGA to reach seated position on EOB but continues to reuire assist for bring BLE to bed surface in returning to supine), Transfers = ModA, Gait training = avg of 20 ft with RW but up to 80 ft with +2 BUE support and MinA initially with max cues for technique/ sequence and then [place] for balance.   overall ModA  L hemibody NMR, midline orientation, sitting balance, standing balance and tolerance, L attention, transfer training, gait training - discharging Thu 05/22/2022    Communication                Safety/Cognition/ Behavioral Observations  min-to-mod A simple cog   Min A - Sup A simple cog   orientation, basic problem solving, recall, intellectual awareness, and left visual scanning    Pain   Patient denied pain, and declined pain medication offered.   Patient will remain pain free, or will express pain if present.   Continue to assess for pain.    Skin   Patient has existing erythema to sacrum. Skin protectant cream an foam applied. Foam to bilateral heels for pressure prevention.   Continue existing skin care and prevent new skin breakdown.  Help patient with repositioning. Float heels while in bed.      Discharge Planning:  SNF [facility]   Team Discussion: Patient with neuropathy; MD adjusting medications; using lidocaine  patch and tramadol prn. Melatonin for sleep and cream to MASD on buttocks.   Patient on target to meet rehab goals: yes, currently needs mod assist for ADLs, min - mod assist for transfers.  Able to ambulate up to 20' using a RW.  Needs mi - mod assist for cognition, orientation , expression, memory and awareness. Some SLP goals not met.   *See Care Plan and progress notes for long and short-term goals.   Revisions to Treatment Plan:  [facility] treatment for neuropathy discussed w daughter   Teaching Needs: Safety, medications, skin care, transfers,  toileting, etc.   Current Barriers to Discharge: Decreased caregiver support  Possible Resolutions to Barriers: SNF recommended     Medical Summary Current Status: fluctuating level of awareness, still with left neglect and field cut  Barriers to Discharge: Behavior/Mood;Other (comments)  Barriers to Discharge Comments: severe left neglect Possible Resolutions to Barriers/Weekly Focus: plan d/c to SNF unit at her retirement facilty in am   Continued Need for Acute Rehabilitation Level of Care: The patient requires daily medical management by a physician with specialized training in physical medicine and rehabilitation for the following reasons: Direction of a multidisciplinary physical rehabilitation program to maximize functional independence : Yes Medical management of patient stability for increased activity during participation in an intensive rehabilitation regime.: Yes Analysis of laboratory values and/or radiology reports with any subsequent need for medication adjustment and/or medical intervention. : Yes   I attest that I was present, lead the team conference, and concur with the assessment and plan of the team.   Dorien Chihuahua B 05/21/2022, 2:35 PM

## 2022-05-22 ENCOUNTER — Other Ambulatory Visit (HOSPITAL_COMMUNITY): Payer: Self-pay

## 2022-05-22 DIAGNOSIS — R627 Adult failure to thrive: Secondary | ICD-10-CM | POA: Diagnosis present

## 2022-05-22 DIAGNOSIS — I10 Essential (primary) hypertension: Secondary | ICD-10-CM | POA: Diagnosis not present

## 2022-05-22 DIAGNOSIS — I340 Nonrheumatic mitral (valve) insufficiency: Secondary | ICD-10-CM | POA: Diagnosis not present

## 2022-05-22 DIAGNOSIS — I700 Atherosclerosis of aorta: Secondary | ICD-10-CM | POA: Diagnosis not present

## 2022-05-22 DIAGNOSIS — E876 Hypokalemia: Secondary | ICD-10-CM | POA: Diagnosis present

## 2022-05-22 DIAGNOSIS — R5381 Other malaise: Secondary | ICD-10-CM | POA: Diagnosis not present

## 2022-05-22 DIAGNOSIS — J90 Pleural effusion, not elsewhere classified: Secondary | ICD-10-CM | POA: Diagnosis not present

## 2022-05-22 DIAGNOSIS — G4751 Confusional arousals: Secondary | ICD-10-CM | POA: Diagnosis not present

## 2022-05-22 DIAGNOSIS — L249 Irritant contact dermatitis, unspecified cause: Secondary | ICD-10-CM | POA: Diagnosis not present

## 2022-05-22 DIAGNOSIS — M2550 Pain in unspecified joint: Secondary | ICD-10-CM | POA: Diagnosis not present

## 2022-05-22 DIAGNOSIS — H53462 Homonymous bilateral field defects, left side: Secondary | ICD-10-CM | POA: Diagnosis not present

## 2022-05-22 DIAGNOSIS — R2689 Other abnormalities of gait and mobility: Secondary | ICD-10-CM | POA: Diagnosis not present

## 2022-05-22 DIAGNOSIS — I6601 Occlusion and stenosis of right middle cerebral artery: Secondary | ICD-10-CM | POA: Diagnosis not present

## 2022-05-22 DIAGNOSIS — F419 Anxiety disorder, unspecified: Secondary | ICD-10-CM | POA: Diagnosis present

## 2022-05-22 DIAGNOSIS — E119 Type 2 diabetes mellitus without complications: Secondary | ICD-10-CM | POA: Diagnosis not present

## 2022-05-22 DIAGNOSIS — G9389 Other specified disorders of brain: Secondary | ICD-10-CM | POA: Diagnosis present

## 2022-05-22 DIAGNOSIS — R269 Unspecified abnormalities of gait and mobility: Secondary | ICD-10-CM | POA: Diagnosis not present

## 2022-05-22 DIAGNOSIS — I482 Chronic atrial fibrillation, unspecified: Secondary | ICD-10-CM | POA: Diagnosis not present

## 2022-05-22 DIAGNOSIS — N390 Urinary tract infection, site not specified: Secondary | ICD-10-CM | POA: Diagnosis present

## 2022-05-22 DIAGNOSIS — I69354 Hemiplegia and hemiparesis following cerebral infarction affecting left non-dominant side: Secondary | ICD-10-CM | POA: Diagnosis not present

## 2022-05-22 DIAGNOSIS — N952 Postmenopausal atrophic vaginitis: Secondary | ICD-10-CM | POA: Diagnosis not present

## 2022-05-22 DIAGNOSIS — R0689 Other abnormalities of breathing: Secondary | ICD-10-CM | POA: Diagnosis not present

## 2022-05-22 DIAGNOSIS — R630 Anorexia: Secondary | ICD-10-CM | POA: Diagnosis not present

## 2022-05-22 DIAGNOSIS — Z1623 Resistance to quinolones and fluoroquinolones: Secondary | ICD-10-CM | POA: Diagnosis present

## 2022-05-22 DIAGNOSIS — I071 Rheumatic tricuspid insufficiency: Secondary | ICD-10-CM | POA: Diagnosis not present

## 2022-05-22 DIAGNOSIS — Z79899 Other long term (current) drug therapy: Secondary | ICD-10-CM | POA: Diagnosis not present

## 2022-05-22 DIAGNOSIS — I495 Sick sinus syndrome: Secondary | ICD-10-CM | POA: Diagnosis not present

## 2022-05-22 DIAGNOSIS — I739 Peripheral vascular disease, unspecified: Secondary | ICD-10-CM | POA: Diagnosis not present

## 2022-05-22 DIAGNOSIS — E440 Moderate protein-calorie malnutrition: Secondary | ICD-10-CM | POA: Diagnosis present

## 2022-05-22 DIAGNOSIS — G4089 Other seizures: Secondary | ICD-10-CM | POA: Diagnosis not present

## 2022-05-22 DIAGNOSIS — R0902 Hypoxemia: Secondary | ICD-10-CM | POA: Diagnosis not present

## 2022-05-22 DIAGNOSIS — Z01818 Encounter for other preprocedural examination: Secondary | ICD-10-CM | POA: Diagnosis not present

## 2022-05-22 DIAGNOSIS — Z515 Encounter for palliative care: Secondary | ICD-10-CM | POA: Diagnosis not present

## 2022-05-22 DIAGNOSIS — R569 Unspecified convulsions: Secondary | ICD-10-CM | POA: Diagnosis present

## 2022-05-22 DIAGNOSIS — Z7901 Long term (current) use of anticoagulants: Secondary | ICD-10-CM | POA: Diagnosis not present

## 2022-05-22 DIAGNOSIS — R279 Unspecified lack of coordination: Secondary | ICD-10-CM | POA: Diagnosis not present

## 2022-05-22 DIAGNOSIS — I4819 Other persistent atrial fibrillation: Secondary | ICD-10-CM | POA: Diagnosis not present

## 2022-05-22 DIAGNOSIS — B9620 Unspecified Escherichia coli [E. coli] as the cause of diseases classified elsewhere: Secondary | ICD-10-CM | POA: Diagnosis present

## 2022-05-22 DIAGNOSIS — Z950 Presence of cardiac pacemaker: Secondary | ICD-10-CM | POA: Diagnosis not present

## 2022-05-22 DIAGNOSIS — I272 Pulmonary hypertension, unspecified: Secondary | ICD-10-CM | POA: Diagnosis not present

## 2022-05-22 DIAGNOSIS — E785 Hyperlipidemia, unspecified: Secondary | ICD-10-CM | POA: Diagnosis present

## 2022-05-22 DIAGNOSIS — Z8249 Family history of ischemic heart disease and other diseases of the circulatory system: Secondary | ICD-10-CM | POA: Diagnosis not present

## 2022-05-22 DIAGNOSIS — H353131 Nonexudative age-related macular degeneration, bilateral, early dry stage: Secondary | ICD-10-CM | POA: Diagnosis not present

## 2022-05-22 DIAGNOSIS — M6281 Muscle weakness (generalized): Secondary | ICD-10-CM | POA: Diagnosis not present

## 2022-05-22 DIAGNOSIS — E1140 Type 2 diabetes mellitus with diabetic neuropathy, unspecified: Secondary | ICD-10-CM | POA: Diagnosis not present

## 2022-05-22 DIAGNOSIS — E1165 Type 2 diabetes mellitus with hyperglycemia: Secondary | ICD-10-CM | POA: Diagnosis present

## 2022-05-22 DIAGNOSIS — E782 Mixed hyperlipidemia: Secondary | ICD-10-CM | POA: Diagnosis not present

## 2022-05-22 DIAGNOSIS — Z1611 Resistance to penicillins: Secondary | ICD-10-CM | POA: Diagnosis present

## 2022-05-22 DIAGNOSIS — R404 Transient alteration of awareness: Secondary | ICD-10-CM | POA: Diagnosis not present

## 2022-05-22 DIAGNOSIS — R634 Abnormal weight loss: Secondary | ICD-10-CM | POA: Diagnosis not present

## 2022-05-22 DIAGNOSIS — Z7401 Bed confinement status: Secondary | ICD-10-CM | POA: Diagnosis not present

## 2022-05-22 DIAGNOSIS — I4821 Permanent atrial fibrillation: Secondary | ICD-10-CM | POA: Diagnosis present

## 2022-05-22 DIAGNOSIS — Z66 Do not resuscitate: Secondary | ICD-10-CM | POA: Diagnosis present

## 2022-05-22 DIAGNOSIS — N139 Obstructive and reflux uropathy, unspecified: Secondary | ICD-10-CM | POA: Diagnosis not present

## 2022-05-22 DIAGNOSIS — D6869 Other thrombophilia: Secondary | ICD-10-CM | POA: Diagnosis present

## 2022-05-22 DIAGNOSIS — I2510 Atherosclerotic heart disease of native coronary artery without angina pectoris: Secondary | ICD-10-CM | POA: Diagnosis not present

## 2022-05-22 DIAGNOSIS — R531 Weakness: Secondary | ICD-10-CM | POA: Diagnosis not present

## 2022-05-22 DIAGNOSIS — G8194 Hemiplegia, unspecified affecting left nondominant side: Secondary | ICD-10-CM | POA: Diagnosis not present

## 2022-05-22 DIAGNOSIS — R41841 Cognitive communication deficit: Secondary | ICD-10-CM | POA: Diagnosis not present

## 2022-05-22 DIAGNOSIS — I639 Cerebral infarction, unspecified: Secondary | ICD-10-CM | POA: Diagnosis not present

## 2022-05-22 DIAGNOSIS — R000 Tachycardia, unspecified: Secondary | ICD-10-CM | POA: Diagnosis not present

## 2022-05-22 DIAGNOSIS — I63511 Cerebral infarction due to unspecified occlusion or stenosis of right middle cerebral artery: Secondary | ICD-10-CM | POA: Diagnosis not present

## 2022-05-22 DIAGNOSIS — F3340 Major depressive disorder, recurrent, in remission, unspecified: Secondary | ICD-10-CM | POA: Diagnosis not present

## 2022-05-22 DIAGNOSIS — R4182 Altered mental status, unspecified: Secondary | ICD-10-CM | POA: Diagnosis not present

## 2022-05-22 LAB — GLUCOSE, CAPILLARY: Glucose-Capillary: 158 mg/dL — ABNORMAL HIGH (ref 70–99)

## 2022-05-22 MED ORDER — TRAMADOL HCL 50 MG PO TABS: 50.0000 mg | ORAL_TABLET | Freq: Two times a day (BID) | ORAL | 0 refills | Status: DC | PRN

## 2022-05-22 MED ORDER — TRAMADOL HCL 50 MG PO TABS
50.0000 mg | ORAL_TABLET | Freq: Two times a day (BID) | ORAL | 0 refills | Status: DC | PRN
  Filled 2022-05-22: qty 5, 3d supply, fill #0

## 2022-05-22 NOTE — Progress Notes (Signed)
Patient discharged off of unit with all belongings via [place]. Patient and family have no further questions at time of discharge. No complications noted at this time.  [place]

## 2022-05-22 NOTE — NC FL2 (Signed)
[place] LEVEL OF CARE FORM     IDENTIFICATION  Patient Name: Kara [name] Birthdate: Feb 06, 1923 Sex: female Admission Date (Current Location): 05/05/2022  [facility] and [place] Number:  Engineering geologist and Address:  [facility] [place], [place] [address], [place], [place] [address]      Provider Number:    Attending Physician Name and Address:  Charlett Blake, MD  Relative Name and Phone Number:  Rosana Hoes    Current Level of Care: Hospital Recommended Level of Care: [place] Prior Approval Number:    Date Approved/Denied:   PASRR Number: [id number]  Discharge Plan: SNF    Current Diagnoses: Patient Active Problem List   Diagnosis Date Noted   Right middle cerebral artery stroke ([place]) 05/05/2022   Acute ischemic stroke ([place]) 05/01/2022   Vulvar irritation 04/22/2022   Lower abdominal pain 08/14/2021   Pain in left upper arm 08/14/2021   Dystrophic nail 08/14/2021   Urinary frequency 02/16/2021   Low back pain, non-specific 11/14/2020   Compression fx, lumbar spine, sequela 11/13/2020   Lumbar spinal stenosis 11/08/2020   Traumatic hemorrhage of cerebrum ([place]) 07/31/2020   History of recent fall 07/30/2020   Intraparenchymal hemorrhage of brain ([place]) 07/13/2020   Syncope and collapse 07/13/2020   Hypokalemia 07/13/2020   Atrial fibrillation, chronic ([place]) 07/13/2020   DNR (do not resuscitate) 07/13/2020   S/P placement of cardiac pacemaker 07/03/2020   Myalgia due to statin 07/03/2020   Aortic atherosclerosis ([place]) 07/03/2020   Persistent atrial fibrillation ([place]) 08/12/2019   Fracture of neck of femur ([place]) 06/27/2019   Displaced intertrochanteric fracture of left femur, sequela 06/25/2019   Age-related osteoporosis with current pathological fracture with routine healing 06/15/2019   Educated about COVID-19 virus infection 10/12/2018   Dysuria 02/02/2018   Edema of lower extremity 11/24/2017    Venous insufficiency of both lower extremities 11/24/2016   Essential hypertension 11/11/2016   Peripheral artery disease ([place]) 11/11/2016   Chronic pulmonary hypertension ([place]) 10/20/2016   Leg swelling 06/17/2016   Type 2 diabetes mellitus with diabetic neuropathy, unspecified ([place]) 06/17/2016   Urinary incontinence, urge 12/27/2015   Atrophic vaginitis 09/26/2015   Severe tricuspid valve insufficiency 07/04/2015   Carpal tunnel syndrome of right wrist 05/29/2015   Chronic fatigue 05/01/2015   E. coli UTI 11/21/2014   Urethral caruncle 11/21/2014   Mitral valve regurgitation 05/09/2014   Malaise and fatigue 03/09/2014   SSS (sick sinus syndrome) ([place]) 10/11/2013   CAD (coronary artery disease) 10/11/2013   Osteoarthritis 03/08/2012   Screening for breast cancer 03/08/2012   Pernicious anemia 02/04/2012   History of colectomy 02/04/2012   Vitamin D deficiency 07/30/2011   Acquired thrombophilia ([place]) 06/13/2011   Coronary atherosclerosis due to calcified coronary lesion 06/13/2011   Chronic diarrhea 06/13/2011    Orientation RESPIRATION BLADDER Height & Weight     Self, Time, Place  Normal Continent Weight: 115 lb 11.9 oz (52.5 kg) Height:  '5\' 1"'$  (154.9 cm)  BEHAVIORAL SYMPTOMS/MOOD NEUROLOGICAL BOWEL NUTRITION STATUS      Continent Diet (Reg diet, thin liqs)  AMBULATORY STATUS COMMUNICATION OF NEEDS Skin   Limited Assist   Normal                       Personal Care Assistance Level of Assistance  Bathing, Feeding, Dressing Bathing Assistance: Limited assistance Feeding assistance: Limited assistance Dressing Assistance: Limited assistance     Functional Limitations Info  SPECIAL CARE FACTORS FREQUENCY  PT (By licensed PT), OT (By licensed OT), Speech therapy     PT Frequency: 5x a week OT Frequency: 5x a week     Speech Therapy Frequency: 3x a week      Contractures      Additional Factors Info  Code Status Code Status Info: DNR              Current Medications (05/22/2022):  This is the current hospital active medication list Current Facility-Administered Medications  Medication Dose Route Frequency Provider Last Rate Last Admin   acetaminophen (TYLENOL) tablet 650 mg  650 mg Oral Q4H PRN Cathlyn Parsons, PA-C   650 mg at 05/16/22 2155   Or   acetaminophen (TYLENOL) 160 MG/5ML solution 650 mg  650 mg Per Tube Q4H PRN Cathlyn Parsons, PA-C   650 mg at 05/14/22 1332   Or   acetaminophen (TYLENOL) suppository 650 mg  650 mg Rectal Q4H PRN Angiulli, Lavon Paganini, PA-C       apixaban Arne [place]) tablet 2.5 mg  2.5 mg Oral BID AngiulliLavon Paganini, PA-C   2.5 mg at 05/22/22 1962   citalopram (CELEXA) tablet 10 mg  10 mg Oral Daily Lovorn, Megan, MD   10 mg at 05/22/22 2297   estradiol (ESTRACE) vaginal cream 1 Applicatorful  1 Applicatorful Vaginal L89Q PRN Angiulli, Lavon Paganini, PA-C       feeding supplement (ENSURE ENLIVE / ENSURE PLUS) liquid 237 mL  237 mL Oral BID BM Kirsteins, Luanna Salk, MD   237 mL at 05/21/22 1337   insulin aspart (novoLOG) injection 0-9 Units  0-9 Units Subcutaneous TID AC & HS Cathlyn Parsons, PA-C   2 Units at 05/22/22 0704   lidocaine (LIDODERM) 5 % 1 patch  1 patch Transdermal Daily PRN Cathlyn Parsons, PA-C   1 patch at 05/21/22 1337   melatonin tablet 3 mg  3 mg Oral QHS Street, [place], [place]   3 mg at 05/21/22 2009   metoprolol tartrate (LOPRESSOR) tablet 12.5 mg  12.5 mg Oral Q12H AngiulliLavon Paganini, PA-C   12.5 mg at 05/22/22 1194   multivitamin with minerals tablet 1 tablet  1 tablet Oral Daily Charlett Blake, MD   1 tablet at 05/22/22 1740   ondansetron (ZOFRAN-ODT) disintegrating tablet 4 mg  4 mg Oral Q8H PRN Cathlyn Parsons, PA-C   4 mg at 05/14/22 2120   pantoprazole (PROTONIX) EC tablet 40 mg  40 mg Oral QHS Charlett Blake, MD   40 mg at 05/21/22 2009   senna-docusate (Senokot-S) tablet 1 tablet  1 tablet Oral QHS PRN Angiulli, Lavon Paganini, PA-C       traMADol Veatrice [place])  tablet 50 mg  50 mg Oral Q12H PRN Cathlyn Parsons, PA-C   50 mg at 05/18/22 1949   traZODone (DESYREL) tablet 25 mg  25 mg Oral QHS PRN Street, [place], PA-C       zinc oxide (BALMEX) 11.3 % cream   Topical BID Cathlyn Parsons, PA-C   Given at 05/22/22 8144     Discharge Medications: Please see discharge summary for a list of discharge medications.  Relevant Imaging Results:  Relevant Lab Results:   Additional Information [id number]  Dyanne Iha

## 2022-05-22 NOTE — Plan of Care (Signed)
Problem: RH Wheelchair Mobility Goal: LTG Patient will propel w/c in home environment (PT) Description: LTG: Patient will propel wheelchair in home environment, # of feet with assistance (PT). Outcome: Adequate for Discharge Flowsheets Taken 05/21/2022 1749 LTG: Pt will propel w/c in home environ  assist needed:: Minimal Assistance - Patient > 75% Taken 05/06/2022 1808 Distance: wheelchair distance in controlled environment: 100 LTG: Propel w/c distance in home environment: up to 40 ft Note: Pt requires CGA with intermittent MinA for maintaining path and turning with hemitechnique Goal: LTG Patient will propel w/c in community environment (PT) Description: LTG: Patient will propel wheelchair in community environment, # of feet with assist (PT) Outcome: Adequate for Discharge Flowsheets Taken 05/21/2022 1749 LTG: Pt will propel w/c in community environ  assist needed:: Minimal Assistance - Patient > 75% Taken 05/06/2022 1808 [id number] w/c distance in community environment: at least 100 ft Note: Will require additional training for technique as well as activity tolerance.   Problem: RH Balance Goal: LTG Patient will maintain dynamic sitting balance (PT) Description: LTG:  Patient will maintain dynamic sitting balance with assistance during mobility activities (PT) Outcome: Completed/Met Flowsheets (Taken 05/21/2022 1749) LTG: Pt will maintain dynamic sitting balance during mobility activities with:: Contact Guard/Touching assist Goal: LTG Patient will maintain dynamic standing balance (PT) Description: LTG:  Patient will maintain dynamic standing balance with assistance during mobility activities (PT) Outcome: Completed/Met Flowsheets (Taken 05/19/2022 0940) LTG: Pt will maintain dynamic standing balance during mobility activities with:: Moderate Assistance - Patient 50 - 74%   Problem: Sit to Stand Goal: LTG:  Patient will perform sit to stand with assistance level (PT) Description: LTG:   Patient will perform sit to stand with assistance level (PT) Outcome: Completed/Met Flowsheets (Taken 05/19/2022 0940) LTG: PT will perform sit to stand in preparation for functional mobility with assistance level: Minimal Assistance - Patient > 75%   Problem: RH Bed Mobility Goal: LTG Patient will perform bed mobility with assist (PT) Description: LTG: Patient will perform bed mobility with assistance, with/without cues (PT). Outcome: Completed/Met Flowsheets (Taken 05/19/2022 0940) LTG: Pt will perform bed mobility with assistance level of: Minimal Assistance - Patient > 75% Note: Give pt time to complete with max cueing.    Problem: RH Bed to Chair Transfers Goal: LTG Patient will perform bed/chair transfers w/assist (PT) Description: LTG: Patient will perform bed to chair transfers with assistance (PT). Outcome: Completed/Met Flowsheets (Taken 05/19/2022 0940) LTG: Pt will perform Bed to Chair Transfers with assistance level: Moderate Assistance - Patient 50 - 74%   Problem: RH Car Transfers Goal: LTG Patient will perform car transfers with assist (PT) Description: LTG: Patient will perform car transfers with assistance (PT). Outcome: Completed/Met Flowsheets (Taken 05/19/2022 0940) LTG: Pt will perform car transfers with assist:: Moderate Assistance - Patient 50 - 74%   Problem: RH Furniture Transfers Goal: LTG Patient will perform furniture transfers w/assist (OT/PT) Description: LTG: Patient will perform furniture transfers  with assistance (OT/PT). Outcome: Completed/Met Flowsheets (Taken 05/19/2022 0940) LTG: Pt will perform furniture transfers with assist:: Moderate Assistance - Patient 50 - 74%   Problem: RH Ambulation Goal: LTG Patient will ambulate in controlled environment (PT) Description: LTG: Patient will ambulate in a controlled environment, # of feet with assistance (PT). Outcome: Completed/Met Flowsheets (Taken 05/19/2022 0940) LTG: Pt will ambulate in controlled  environ  assist needed:: Moderate Assistance - Patient 50 - 74% LTG: Ambulation distance in controlled environment: 50 ft using LRAD Goal: LTG Patient will ambulate in home environment (PT)  Description: LTG: Patient will ambulate in home environment, # of feet with assistance (PT). Outcome: Completed/Met Flowsheets Taken 05/19/2022 0940 LTG: Pt will ambulate in home environ  assist needed:: Moderate Assistance - Patient 50 - 74% Taken 05/06/2022 1808 LTG: Ambulation distance in home environment: up to 40 ft using LRAD   Problem: RH Stairs Goal: LTG Patient will ambulate up and down stairs w/assist (PT) Description: LTG: Patient will ambulate up and down # of stairs with assistance (PT) Outcome: Not Applicable Flowsheets (Taken 05/21/2022 1749) LTG: Pt will ambulate up/down stairs assist needed:: Maximal Assistance - Patient 25 - 49% Note: Pt will require additional training if returning to [place]. As pt is discharging to SNF, this goal is not applicable to this rehab setting.

## 2022-05-22 NOTE — Progress Notes (Signed)
PROGRESS NOTE   Subjective/Complaints:    ROS- limited by communication/mood  Objective:   No results found. No results for input(s): "WBC", "HGB", "HCT", "PLT" in the last 72 hours.   No results for input(s): "NA", "K", "CL", "CO2", "GLUCOSE", "BUN", "CREATININE", "CALCIUM" in the last 72 hours.    Intake/Output Summary (Last 24 hours) at 05/22/2022 0825 Last data filed at 05/21/2022 1804 Gross per 24 hour  Intake 397 ml  Output --  Net 397 ml         Physical Exam: Vital Signs Blood pressure 128/61, pulse 75, temperature 98.2 F (36.8 C), temperature source Oral, resp. rate 16, height '5\' 1"'$  (1.549 m), weight 52.5 kg, SpO2 94 %.   General: No acute distress Mood and affect are appropriate Heart: Regular rate and rhythm no rubs murmurs or extra sounds Lungs: Clear to auscultation, breathing unlabored, no rales or wheezes Abdomen: Positive bowel sounds, soft nontender to palpation, nondistended Extremities: No clubbing, cyanosis, or edema   Neurologic: severe L neglect and homonymous hemianopsia , motor strength is 5/5 in right and 3-/5deltoid, bicep, tricep, grip, hip flexor, knee extensors, ankle dorsiflexor and plantar flexor Sensory exam reduced sensation to LT in LUE > LLE, wince to pinch   Musculoskeletal: ,No pain with left ankle ROM, no pain over the Achilles, no heel tenderness  Assessment/Plan: 1. Functional deficits due to Right MCA infarct  Stable for D/C today F/u PCP at SNF F/u Neuro 1-2 mo F/u cardiology  F/u PM&R 2 weeks See D/C summary See D/C instructions   Care Tool:  Bathing    Body parts bathed by patient: Right arm, Left arm, Chest, Abdomen, Front perineal area, Right upper leg, Left upper leg, Right lower leg, Left lower leg, Face   Body parts bathed by helper: Buttocks Body parts n/a: Front perineal area, Buttocks, Right lower leg, Left lower leg, Left upper leg, Left arm,  Right arm   Bathing assist Assist Level: Minimal Assistance - Patient > 75%     Upper Body Dressing/Undressing Upper body dressing   What is the patient wearing?: Pull over shirt    Upper body assist Assist Level: Moderate Assistance - Patient 50 - 74%    Lower Body Dressing/Undressing Lower body dressing      What is the patient wearing?: Incontinence brief, Pants     Lower body assist Assist for lower body dressing: Maximal Assistance - Patient 25 - 49%     Toileting Toileting Toileting Activity did not occur (Clothing management and hygiene only): N/A (no void or bm)  Toileting assist Assist for toileting: Maximal Assistance - Patient 25 - 49%     Transfers Chair/bed transfer  Transfers assist     Chair/bed transfer assist level: Minimal Assistance - Patient > 75%     Locomotion Ambulation   Ambulation assist   Ambulation activity did not occur: Safety/medical concerns  Assist level: Moderate Assistance - Patient 50 - 74% (+2 for mirror feedback) Assistive device: Walker-rolling Max distance: 51f   Walk 10 feet activity   Assist  Walk 10 feet activity did not occur: Safety/medical concerns        Walk 50 feet activity  Assist Walk 50 feet with 2 turns activity did not occur: Safety/medical concerns         Walk 150 feet activity   Assist Walk 150 feet activity did not occur: Safety/medical concerns         Walk 10 feet on uneven surface  activity   Assist Walk 10 feet on uneven surfaces activity did not occur: Safety/medical concerns         Wheelchair     Assist Is the patient using a wheelchair?: Yes Type of Wheelchair: Manual Wheelchair activity did not occur: Safety/medical concerns         Wheelchair 50 feet with 2 turns activity    Assist    Wheelchair 50 feet with 2 turns activity did not occur: Safety/medical concerns       Wheelchair 150 feet activity     Assist  Wheelchair 150 feet  activity did not occur: Safety/medical concerns       Blood pressure 128/61, pulse 75, temperature 98.2 F (36.8 C), temperature source Oral, resp. rate 16, height '5\' 1"'$  (1.549 m), weight 52.5 kg, SpO2 94 %.  Medical Problem List and Plan: 1. Functional deficits secondary to right MCA infarction, A2 and P2 occlusion status post TNK/status post thrombectomy             -patient may  shower             -ELOS/Goals: 05/22/22 Min/Mod A goals for ADLs and Mobility   D/c to SNF 2.  Antithrombotics: -DVT/anticoagulation:  SCD             -antiplatelet therapy: Eliquis 2.5 mg BID 3. Diabetic peripheral neuropathy: daughter not sure she wants to pursue Quetneza treatment for neuropathy  Lidoderm patch.  Tramadol as needed  -Continue tramadol PRN, although pt has not requested for >3d  -Daughter says medications that caused her nausea in past was cymbalta, discussed trying gabapentin, she would like to hold off unless pain worsens  4. Mood/Behavior/Sleep: Provide emotional support             -antipsychotic agents: N/A 5. Neuropsych/cognition: This patient IS capable of making decisions on her own behalf. 6. Skin/Wound Care: Routine skin checks Developing gr 1 decub left lateral heel will order PRAFO  7. Fluids/Electrolytes/Nutrition: Routine in and outs with follow-up chemistries 8.  Atrial fibrillation/pacemaker.  continue Lopressor 12.5 mg every 12 hours.  Cardiac rate controlled  Eliquis 2.'5mg'$  BID 9.  Diabetes mellitus.  Hemoglobin A1c 6.9.  SSI. CBG (last 3)  Recent Labs    05/21/22 1649 05/21/22 2110 05/22/22 0700  GLUCAP 133* 164* 158*   05/22/22 controlled   12/31- CBG's controlled- con't regimen 10.  Dysphagia.  Dysphagia #3 thin liquids.  Follow-up speech therapy -Upgraded to regular/thin 12/24 11.  GERD. continue Protonix  12.  Hypo K+ improved after po supplementation cont to monitor  -BMP tomorrow ordered  12/31- will check labs in AM    Latest Ref Rng & Units 05/19/2022     7:03 AM 05/10/2022    5:43 AM 05/09/2022    5:34 AM  BMP  Glucose 70 - 99 mg/dL 165  133  123   BUN 8 - 23 mg/dL '14  16  11   '$ Creatinine 0.44 - 1.00 mg/dL 0.92  0.83  0.75   Sodium 135 - 145 mmol/L 137  138  138   Potassium 3.5 - 5.1 mmol/L 3.5  4.0  2.9   Chloride 98 - 111 mmol/L 97  99  95   CO2 22 - 32 mmol/L '28  26  31   '$ Calcium 8.9 - 10.3 mg/dL 8.8  8.9  9.1    -Discussed with pharmacy, recommended KCL oral 26mq x2 and recheck tomorrow , appreciate assistance -K+ up to 4.0 on 12/23  13.  Thrush  -s/p diflucan 12/20  -resolved, stop magic mouthwash 14. Moderate malnutrition  -Consult dietician 15. Lower extremity edema: resolved, placed order to place SCDs at night for circulation. 16. UTI: keflex completed 17. Confusion  Resolved CXR neg for acute process 18. Depression-  Celexa 10 mg daily for mood.        LOS: 17 days A FACE TO FJesupE Shyhiem Beeney 05/22/2022, 8:25 AM

## 2022-05-22 NOTE — Progress Notes (Signed)
Inpatient Rehabilitation Discharge Medication Review by a Pharmacist  A complete drug regimen review was completed for this patient to identify any potential clinically significant medication issues.  High Risk Drug Classes Is patient taking? Indication by Medication  Antipsychotic No   Anticoagulant Yes Apixaban - Stroke   Antibiotic No   Opioid Yes Ultram - prn pain  Antiplatelet No   Hypoglycemics/insulin No   Vasoactive Medication Yes Metoprolol - BP  Chemotherapy No   Other Yes Celexa-depression  Estradiol vaginal cream-prn dryness  Lidocaine patch - pain  Pantoprazole - Reflux     Type of Medication Issue Identified Description of Issue Recommendation(s)  Drug Interaction(s) (clinically significant)     Duplicate Therapy     Allergy     No Medication Administration End Date     Incorrect Dose     Additional Drug Therapy Needed     Significant med changes from prior encounter (inform family/care partners about these prior to discharge).    Other       Clinically significant medication issues were identified that warrant physician communication and completion of prescribed/recommended actions by midnight of the next day:  No  Pharmacist comments: None   Time spent performing this drug regimen review (minutes):  15 minutes  Thank you Nicole Cella, RPh Clinical Pharmacist 05/22/2022

## 2022-05-23 DIAGNOSIS — I4819 Other persistent atrial fibrillation: Secondary | ICD-10-CM | POA: Diagnosis not present

## 2022-05-23 DIAGNOSIS — I69354 Hemiplegia and hemiparesis following cerebral infarction affecting left non-dominant side: Secondary | ICD-10-CM | POA: Diagnosis not present

## 2022-06-02 ENCOUNTER — Other Ambulatory Visit: Payer: Self-pay

## 2022-06-02 NOTE — Patient Outreach (Signed)
Patient daughter called in to reschedule Care management call 06/11/22 due to recent stoke. Rescheduled to 09/10/22 1:30pm.  [place] Care Management Assistant [phone]

## 2022-06-05 ENCOUNTER — Encounter: Payer: Medicare Other | Admitting: Physical Medicine and Rehabilitation

## 2022-06-10 DIAGNOSIS — R4182 Altered mental status, unspecified: Secondary | ICD-10-CM | POA: Diagnosis not present

## 2022-06-24 ENCOUNTER — Ambulatory Visit: Admit: 2022-06-24 | Payer: Medicare Other | Admitting: Cardiology

## 2022-06-24 DIAGNOSIS — Z45010 Encounter for checking and testing of cardiac pacemaker pulse generator [battery]: Secondary | ICD-10-CM

## 2022-06-24 SURGERY — PPM GENERATOR CHANGEOUT
Anesthesia: Moderate Sedation

## 2022-07-02 DIAGNOSIS — I4819 Other persistent atrial fibrillation: Secondary | ICD-10-CM | POA: Diagnosis not present

## 2022-07-02 DIAGNOSIS — R0902 Hypoxemia: Secondary | ICD-10-CM | POA: Diagnosis not present

## 2022-07-02 DIAGNOSIS — I10 Essential (primary) hypertension: Secondary | ICD-10-CM | POA: Diagnosis not present

## 2022-07-02 DIAGNOSIS — E1140 Type 2 diabetes mellitus with diabetic neuropathy, unspecified: Secondary | ICD-10-CM | POA: Diagnosis not present

## 2022-07-02 DIAGNOSIS — I340 Nonrheumatic mitral (valve) insufficiency: Secondary | ICD-10-CM | POA: Diagnosis not present

## 2022-07-02 DIAGNOSIS — Z7401 Bed confinement status: Secondary | ICD-10-CM | POA: Diagnosis not present

## 2022-07-02 DIAGNOSIS — R531 Weakness: Secondary | ICD-10-CM | POA: Diagnosis not present

## 2022-07-02 DIAGNOSIS — I071 Rheumatic tricuspid insufficiency: Secondary | ICD-10-CM | POA: Diagnosis not present

## 2022-07-02 DIAGNOSIS — Z950 Presence of cardiac pacemaker: Secondary | ICD-10-CM | POA: Diagnosis not present

## 2022-07-02 DIAGNOSIS — I495 Sick sinus syndrome: Secondary | ICD-10-CM | POA: Diagnosis not present

## 2022-07-02 DIAGNOSIS — E782 Mixed hyperlipidemia: Secondary | ICD-10-CM | POA: Diagnosis not present

## 2022-07-02 DIAGNOSIS — J90 Pleural effusion, not elsewhere classified: Secondary | ICD-10-CM | POA: Diagnosis not present

## 2022-07-02 DIAGNOSIS — Z01818 Encounter for other preprocedural examination: Secondary | ICD-10-CM | POA: Diagnosis not present

## 2022-07-02 DIAGNOSIS — I700 Atherosclerosis of aorta: Secondary | ICD-10-CM | POA: Diagnosis not present

## 2022-07-02 DIAGNOSIS — I2510 Atherosclerotic heart disease of native coronary artery without angina pectoris: Secondary | ICD-10-CM | POA: Diagnosis not present

## 2022-07-08 DIAGNOSIS — L249 Irritant contact dermatitis, unspecified cause: Secondary | ICD-10-CM | POA: Diagnosis not present

## 2022-07-13 ENCOUNTER — Encounter: Payer: Self-pay | Admitting: Physical Medicine and Rehabilitation

## 2022-07-17 ENCOUNTER — Non-Acute Institutional Stay: Payer: Medicare Other | Admitting: Nurse Practitioner

## 2022-07-17 ENCOUNTER — Encounter: Payer: Medicare Other | Admitting: Nurse Practitioner

## 2022-07-17 ENCOUNTER — Encounter: Payer: Self-pay | Admitting: Nurse Practitioner

## 2022-07-17 ENCOUNTER — Ambulatory Visit (INDEPENDENT_AMBULATORY_CARE_PROVIDER_SITE_OTHER): Payer: Medicare Other | Admitting: Nurse Practitioner

## 2022-07-17 DIAGNOSIS — I639 Cerebral infarction, unspecified: Secondary | ICD-10-CM

## 2022-07-17 DIAGNOSIS — Z515 Encounter for palliative care: Secondary | ICD-10-CM

## 2022-07-17 DIAGNOSIS — R5381 Other malaise: Secondary | ICD-10-CM

## 2022-07-17 DIAGNOSIS — R630 Anorexia: Secondary | ICD-10-CM | POA: Diagnosis not present

## 2022-07-17 NOTE — Progress Notes (Addendum)
[facility] Palliative Care Consult Note Telephone: [phone]  Fax: [phone]   Date of encounter: 07/17/22 5:08 PM PATIENT NAME: Kara [name] [address] [place] [address]   [phone] (home)  DOB: [birth year] MRN: [id number] PRIMARY CARE PROVIDER:    Crecencio Mc, MD,  [address] [place] [address] [phone]  REFERRING PROVIDER:   Crecencio Mc, MD [address] [place],  [place] [address] [phone]  RESPONSIBLE PARTY:    Contact Information     Name Relation Home Work [place] Daughter [phone]     Lamm,Woody Relative   Elfrida   [id number]      I met face to face with patient in facility. Palliative Care was asked to follow this patient by consultation request of  Dr Ouida Sills Georges Lynch of [facility] to address advance care planning and complex medical decision making. This is the initial visit.                               ASSESSMENT AND PLAN / RECOMMENDATIONS:  Symptom Management/Plan: 1. Advance Care Planning;   2. Goals of Care: Goals include to maximize quality of life and symptom management. Our advance care planning conversation included a discussion about:    The value and importance of advance care planning  Exploration of personal, cultural or spiritual beliefs that might influence medical decisions  Exploration of goals of care in the event of a sudden injury or illness  Identification and preparation of a healthcare agent  Review and updating or creation of an advance directive document. 3. Palliative care encounter; Palliative care encounter; Palliative medicine team will continue to support patient, patient's family, and medical team. Visit consisted of counseling and education dealing with the complex and emotionally intense issues of symptom management and palliative care in the setting of serious and potentially  life-threatening illness  4. Debility secondary to CVA ongoing work with therapy PT/OT; ambulating in the hall with 2 person assist, walker. Fall risk; continue to monitor progress, may require LTC placement following STR  5. Anorexia secondary to dysphagia from CVA; reviewed weights, appetite, continue to weight, supplements, slowly improving.  Follow up Palliative Care Visit: Palliative care will continue to follow for complex medical decision making, advance care planning, and clarification of goals. Return 2 to 6 weeks or prn.  I spent 46 minutes providing this consultation. More than 50% of the time in this consultation was spent in counseling and care coordination. PPS: 40%  Chief Complaint: Initial palliative consult for complex medical decision making, address goals, manage ongoing symptoms  HISTORY OF PRESENT ILLNESS:  Kara [name] is a 89 y.o. year old female  with multiple medical problems including CVA, afib/pacemaker, CAD, dysphagia, chronic pain with pain management, DM, HL, h/o uterine cancer, GERD. Hospitalized 05/05/2022 to 05/22/2022 for right middle cerebral artery stroke, discharged to STR at [facility] where she currently resides. Kara [name] requires assistance for transfers, mobility, adl's including bathing, dressing. Kara [name] is able to feed herself with tray set up ongoing declined appetite. At present Kara [name] is sitting in the w/c in her room, appears comfortable, no visitors present. I visited and observed Kara [name]. We talked about how she has been feeling, ros, appetite, quality of life. We talked about therapy, progress. We talked about past medical history, social and family  history. We talked about goals with therapy, debility. We talked about what brings her joy, improves quality of life. Will continue to monitor, follow with pc see how she does through therapy. Support provided. Medications, goc, poc reviewed. Attempted to contact dtg, unable to reach, unable  to leave a message. Updated staff.  History obtained from review of EMR, discussion with primary team, and interview with family, facility staff/caregiver and/or Kara. [name].  I reviewed available labs, medications, imaging, studies and related documents from the EMR.  Records reviewed and summarized above.  Physical Exam: Constitutional: NAD General: frail appearing, thin elderly pleasant female ENMT: oral mucous membranes moist CV: S1S2, RRR Pulmonary: LCTA MSK: steps with 2 person assists; +muscle wasting Skin: warm and dry Neuro:  + generalized weakness,  + cognitive impairment Psych: oriented CURRENT PROBLEM LIST:  Patient Active Problem List   Diagnosis Date Noted   Right middle cerebral artery stroke ([place]) 05/05/2022   Acute ischemic stroke ([place]) 05/01/2022   Vulvar irritation 04/22/2022   Lower abdominal pain 08/14/2021   Pain in left upper arm 08/14/2021   Dystrophic nail 08/14/2021   Urinary frequency 02/16/2021   Low back pain, non-specific 11/14/2020   Compression fx, lumbar spine, sequela 11/13/2020   Lumbar spinal stenosis 11/08/2020   Traumatic hemorrhage of cerebrum ([place]) 07/31/2020   History of recent fall 07/30/2020   Intraparenchymal hemorrhage of brain ([place]) 07/13/2020   Syncope and collapse 07/13/2020   Hypokalemia 07/13/2020   Atrial fibrillation, chronic ([place]) 07/13/2020   DNR (do not resuscitate) 07/13/2020   S/P placement of cardiac pacemaker 07/03/2020   Myalgia due to statin 07/03/2020   Aortic atherosclerosis ([place]) 07/03/2020   Persistent atrial fibrillation ([place]) 08/12/2019   Fracture of neck of femur ([place]) 06/27/2019   Displaced intertrochanteric fracture of left femur, sequela 06/25/2019   Age-related osteoporosis with current pathological fracture with routine healing 06/15/2019   Educated about COVID-19 virus infection 10/12/2018   Dysuria 02/02/2018   Edema of lower extremity 11/24/2017   Venous insufficiency of both lower extremities  11/24/2016   Essential hypertension 11/11/2016   Peripheral artery disease ([place]) 11/11/2016   Chronic pulmonary hypertension ([place]) 10/20/2016   Leg swelling 06/17/2016   Type 2 diabetes mellitus with diabetic neuropathy, unspecified ([place]) 06/17/2016   Urinary incontinence, urge 12/27/2015   Atrophic vaginitis 09/26/2015   Severe tricuspid valve insufficiency 07/04/2015   Carpal tunnel syndrome of right wrist 05/29/2015   Chronic fatigue 05/01/2015   E. coli UTI 11/21/2014   Urethral caruncle 11/21/2014   Mitral valve regurgitation 05/09/2014   Malaise and fatigue 03/09/2014   SSS (sick sinus syndrome) ([place]) 10/11/2013   CAD (coronary artery disease) 10/11/2013   Osteoarthritis 03/08/2012   Screening for breast cancer 03/08/2012   Pernicious anemia 02/04/2012   History of colectomy 02/04/2012   Vitamin D deficiency 07/30/2011   Acquired thrombophilia ([place]) 06/13/2011   Coronary atherosclerosis due to calcified coronary lesion 06/13/2011   Chronic diarrhea 06/13/2011   PAST MEDICAL HISTORY:  Active Ambulatory Problems    Diagnosis Date Noted   Acquired thrombophilia ([place]) 06/13/2011   Coronary atherosclerosis due to calcified coronary lesion 06/13/2011   Chronic diarrhea 06/13/2011   Vitamin D deficiency 07/30/2011   Pernicious anemia 02/04/2012   History of colectomy 02/04/2012   Osteoarthritis 03/08/2012   Screening for breast cancer 03/08/2012   Mitral valve regurgitation 05/09/2014   E. coli UTI 11/21/2014   Urethral caruncle 11/21/2014   Chronic fatigue 05/01/2015   Carpal tunnel syndrome of right  wrist 05/29/2015   Atrophic vaginitis 09/26/2015   Urinary incontinence, urge 12/27/2015   Leg swelling 06/17/2016   Type 2 diabetes mellitus with diabetic neuropathy, unspecified ([place]) 06/17/2016   Essential hypertension 11/11/2016   Peripheral artery disease ([place]) 11/11/2016   Edema of lower extremity 11/24/2017   Dysuria 02/02/2018   Educated about COVID-19 virus  infection 10/12/2018   Chronic pulmonary hypertension ([place]) 10/20/2016   Malaise and fatigue 03/09/2014   Severe tricuspid valve insufficiency 07/04/2015   Venous insufficiency of both lower extremities 11/24/2016   Displaced intertrochanteric fracture of left femur, sequela 06/25/2019   Age-related osteoporosis with current pathological fracture with routine healing 06/15/2019   SSS (sick sinus syndrome) ([place]) 10/11/2013   S/P placement of cardiac pacemaker 07/03/2020   Myalgia due to statin 07/03/2020   Aortic atherosclerosis ([place]) 07/03/2020   Intraparenchymal hemorrhage of brain ([place]) 07/13/2020   Syncope and collapse 07/13/2020   Hypokalemia 07/13/2020   Atrial fibrillation, chronic ([place]) 07/13/2020   DNR (do not resuscitate) 07/13/2020   History of recent fall 07/30/2020   Lumbar spinal stenosis 11/08/2020   Compression fx, lumbar spine, sequela 11/13/2020   Low back pain, non-specific 11/14/2020   Urinary frequency 02/16/2021   Lower abdominal pain 08/14/2021   Pain in left upper arm 08/14/2021   Dystrophic nail 08/14/2021   CAD (coronary artery disease) 10/11/2013   Fracture of neck of femur ([place]) 06/27/2019   Traumatic hemorrhage of cerebrum ([facility]) 07/31/2020   Persistent atrial fibrillation ([place]) 08/12/2019   Vulvar irritation 04/22/2022   Acute ischemic stroke ([place]) 05/01/2022   Right middle cerebral artery stroke ([place]) 05/05/2022   Resolved Ambulatory Problems    Diagnosis Date Noted   Urinary tract infection 06/13/2011   Atrial fibrillation ([place]) 06/13/2011   Pancreatitis, acute 12/01/2011   Fatigue 02/04/2012   Decubitus ulcer of sacral region, stage 1 03/08/2012   Lightheadedness 06/08/2012   Hemorrhoids 06/08/2012   Encounter for current long-term use of anticoagulants 10/06/2012   Hypokalemia 04/12/2013   Edema 09/27/2013   UTI (urinary tract infection) 03/09/2014   History of pancreatitis 03/09/2014   Fatigue 03/09/2014   Viral URI with cough 06/13/2014    Recurrent urinary tract infection 08/06/2014   Itching 09/15/2014   Acute cystitis without hematuria 10/26/2014   Vaginal atrophy 11/21/2014   Closed fracture of part of upper end of humerus 01/29/2015   Skin lesion 01/29/2015   Weakness generalized 05/01/2015   Left leg pain 05/01/2015   Controlled type 2 diabetes mellitus without complication, without long-term current use of insulin ([place]) 05/29/2015   Dark urine [id number]   Umbilical hernia without obstruction and without gangrene 06/26/2015   Hereditary and idiopathic peripheral neuropathy 09/26/2015   Hypokalemia 12/12/2015   Diarrhea 06/17/2016   Exertional dyspnea 06/16/2017   Vaginitis and vulvovaginitis 12/26/2018   Sciatica of right side 02/18/2019   Vaginal itching 03/25/2019   Femur fracture, left ([place]) 06/09/2019   Facial fracture due to fall ([place]) 07/13/2020   Orbital fracture ([place]) 07/13/2020   HTN (hypertension) 07/13/2020   Type 2 diabetes mellitus ([place]) 07/13/2020   Intracranial hemorrhage after injury without loss of consciousness ([place]) 07/22/2020   Hospital discharge follow-up 07/22/2020   Left buttock pain 07/30/2020   Left hand pain 07/30/2020   Closed compression fracture of L2 lumbar vertebra, initial encounter ([place]) 11/08/2020   Pressure injury of skin 11/14/2020   Closed intertrochanteric fracture of left femur ([place]) 08/22/2020   Contusion of lower back 08/22/2020   Past Medical History:  Diagnosis Date   Allergy    Cancer ([place])    Chronic anticoagulation    Decubitus ulcer    Diabetes ([place])    Fibrocystic breast disease    GERD (gastroesophageal reflux disease)    Glaucoma    Glaucoma    Hematuria    History of colon polyps    Hyperlipidemia    Hypertension    IBS (irritable bowel syndrome)    Microscopic hematuria    Plantar fasciitis    Recurrent UTI    Skin cancer    Sleep apnea, obstructive    Yeast vaginitis    SOCIAL HX:  Social History   Tobacco Use   Smoking status:  Former   Smokeless tobacco: Never   Tobacco comments:    quit 45 years ago  Substance Use Topics   Alcohol use: Yes    Alcohol/week: 0.0 standard drinks of alcohol    Comment: Rarely, 1-2 times a year   FAMILY HX:  Family History  Problem Relation Age of Onset   Coronary artery disease Father    Kidney disease Father    Cancer Sister    Diabetes Other    Cancer Mother        COLON   Bladder Cancer Neg Hx    Breast cancer Neg Hx       ALLERGIES:  Allergies  Allergen Reactions   Baycol [Cerivastatin Sodium]    Cardizem [Diltiazem Hcl]     LOWER EXTREMITY EDEMA   Crestor [Rosuvastatin Calcium] Other (See Comments)    INTOLERANT   Cymbalta [Duloxetine Hcl] Nausea Only   Dronedarone Other (See Comments)    Fatigue   Metoprolol Tartrate Other (See Comments)   Omeprazole Other (See Comments)   Pravachol     INTOLERANT   Statins Other (See Comments)   Vioxx [Rofecoxib]    Zocor [Simvastatin]     INTOLERANT     PERTINENT MEDICATIONS:  Outpatient Encounter Medications as of 07/17/2022  Medication Sig   acetaminophen (TYLENOL) 325 MG tablet Take 2 tablets (650 mg total) by mouth every 4 (four) hours as needed for mild pain (or temp > 37.5 C (99.5 F)).   apixaban (ELIQUIS) 2.5 MG TABS tablet Take 1 tablet (2.5 mg total) by mouth 2 (two) times daily.   Cholecalciferol (VITAMIN D) 50 MCG (2000 UT) CAPS Take 1 capsule (2,000 Units total) by mouth daily at 2 am.   citalopram (CELEXA) 10 MG tablet Take 1 tablet (10 mg total) by mouth daily.   cyanocobalamin (,VITAMIN B-12,) 1000 MCG/ML injection INJECT 1ML INTO THE MUSCLE EVERY 14 DAYS (Patient taking differently: Inject 1,000 mcg into the muscle See admin instructions. INJECT 1ML INTO THE MUSCLE EVERY 14 DAYS)   estradiol (ESTRACE) 0.1 MG/GM vaginal cream Place 1 Applicatorful vaginally every 3 (three) days as needed (vaginal dryness. To be dabbed around [place]).   lidocaine (LIDODERM) 5 % Place 1 patch onto the skin daily as  needed. Remove & Discard patch within 12 hours or as directed by MD   melatonin 3 MG TABS tablet Take 1 tablet (3 mg total) by mouth at bedtime.   metoprolol tartrate (LOPRESSOR) 25 MG tablet Take 0.5 tablets (12.5 mg total) by mouth every 12 (twelve) hours.   Multiple Vitamin (MULTIVITAMIN WITH MINERALS) TABS tablet Take 1 tablet by mouth daily.   pantoprazole (PROTONIX) 40 MG tablet Take 1 tablet (40 mg total) by mouth at bedtime.   traMADol (ULTRAM) 50 MG tablet Take 1 tablet (50 mg  total) by mouth every 12 (twelve) hours as needed for moderate pain.   zinc oxide (BALMEX) 11.3 % CREA cream Apply 1 Application topically 2 (two) times daily.   No facility-administered encounter medications on file as of 07/17/2022.   Thank you for the opportunity to participate in the care of Kara. [name].  The palliative care team will continue to follow. Please call our office at [phone] if we can be of additional assistance.   Jancie Kercher Z Marley Charlot, NP ,

## 2022-07-22 ENCOUNTER — Encounter: Payer: Medicare Other | Admitting: Physical Medicine and Rehabilitation

## 2022-07-23 ENCOUNTER — Encounter: Payer: Self-pay | Admitting: Nurse Practitioner

## 2022-07-23 ENCOUNTER — Non-Acute Institutional Stay: Payer: Medicare Other | Admitting: Nurse Practitioner

## 2022-07-23 DIAGNOSIS — R630 Anorexia: Secondary | ICD-10-CM

## 2022-07-23 DIAGNOSIS — Z515 Encounter for palliative care: Secondary | ICD-10-CM | POA: Diagnosis not present

## 2022-07-23 DIAGNOSIS — R5381 Other malaise: Secondary | ICD-10-CM

## 2022-07-23 DIAGNOSIS — R634 Abnormal weight loss: Secondary | ICD-10-CM

## 2022-07-23 DIAGNOSIS — I639 Cerebral infarction, unspecified: Secondary | ICD-10-CM

## 2022-07-23 NOTE — Progress Notes (Signed)
[facility] [facility] Palliative Care Consult Note Telephone: [phone]  Fax: [phone]    Date of encounter: 07/23/22 8:00 PM PATIENT NAME: Kara [name] [address] [place] [address]   [phone] (home)  DOB: 08-10-1922 MRN: [id number] PRIMARY CARE PROVIDER:   [facility] [address] Crecencio Mc, MD,  [address] [place] [address] [phone]  RESPONSIBLE PARTY:    Contact Information     Name Relation Home Work [name] Daughter [phone]     [name],Kara Relative   [phone]   [name] Kara   [id number]     I met face to face with patient in facility and daughter, Kara [name]. Palliative Care was asked to follow this patient by consultation request of  Dr Ouida Sills Georges Lynch of [facility] to address advance care planning and complex medical decision making. This is the initial visit.                               ASSESSMENT AND PLAN / RECOMMENDATIONS:  Symptom Management/Plan: 1. Advance Care Planning;  DNR 01/17/2023 weight 126 lbs 05/28/2022 weight 122.2 lbs 06/22/2022 weight 114.8 lbs 11.2 lbs; 6 months; 8.89% loss 7.4 lbs/4 weeks; 6.06% loss  6 weeks ago PPS 60% Currently PPS 40%  Introduction of hospice to Kara [name], when therapy is completed with debility, weakness; will need to revisit hospice with weight loss, anorexia, severe functional and mild cognitive decline.  2. Goals of Care: Goals include to maximize quality of life and symptom management. Our advance care planning conversation included a discussion about:    The value and importance of advance care planning  Exploration of personal, cultural or spiritual beliefs that might influence medical decisions  Exploration of goals of care in the event of a sudden injury or illness  Identification and preparation of a healthcare agent  Review and updating or creation of an advance directive document. 3. Palliative care  encounter; Palliative care encounter; Palliative medicine team will continue to support patient, patient's family, and medical team. Visit consisted of counseling and education dealing with the complex and emotionally intense issues of symptom management and palliative care in the setting of serious and potentially life-threatening illness   4. Debility secondary to CVA ongoing work with therapy PT/OT; ambulating in the hall with 2 person assist, walker. Fall risk; continue to monitor progress, may require LTC placement following STR  5. Weight loss; Anorexia secondary to dysphagia from CVA; reviewed weights, appetite, continue to weight, supplements,  01/17/2023 weight 126 lbs 05/28/2022 weight 122.2 lbs 06/22/2022 weight 114.8 lbs 11.2 lbs; 6 months; 8.89% loss 7.4 lbs/4 weeks; 6.06% loss Follow up Palliative Care Visit: Palliative care will continue to follow for complex medical decision making, advance care planning, and clarification of goals. Return 2 to 6 weeks or prn.   I spent 56 minutes providing this consultation. More than 50% of the time in this consultation was spent in counseling and care coordination. PPS: 40%   Chief Complaint: Initial palliative consult for complex medical decision making, address goals, manage ongoing symptoms   HISTORY OF PRESENT ILLNESS:  Kara [name] is a 89 y.o. year old female  with multiple medical problems including CVA, afib/pacemaker, CAD, dysphagia, chronic pain with pain management, DM, HL, h/o uterine cancer, GERD. Hospitalized 05/05/2022 to 05/22/2022 for right middle cerebral artery stroke, discharged to STR at [name] where she currently resides.  Kara [name] requires assistance for transfers, mobility, adl's including bathing, dressing. Kara [name] is able to feed herself with tray set up ongoing declined appetite. At present Kara [name] is lying in bed, appears comfortable, daughter Kara [name] present. We talked about how Kara [name] is feeling, ros,  functional, work with therapy. We talked about appetite, weight, expectation with therapy. Support provided. Kara [name] and I met separately from Kara [name], clinical update discussed, talked at length about social, family history, functional abilities and cognitive prior to hospitalization, independence residing at the apartment at Kara [name]. We talked about debilitating stroke which has left Kara [name] very debilitated, cognitive impairment. We talked about therapy and hope to return to her apartments. We talked about realistic expectations with current situation, we talked about therapy which maybe getting ready to be completed as she maybe approaching new baseline. We talked about an LTC vs apartment with 24 hours caregivers. We talked about increase in skill level and concerns for Kara [name] may not be able to return to the apartment with current abilities. We talked about risks and benefits. We talked about hospice benefit under Medicare program. We talked about challenges with loss of independence, depression and upcoming Neurology appointment with list of questions. We talked about role pc in poc. Discussed will f/u in 2 weeks to see how progressing with therapy, appetite, noted weight loss. Questions answered. Therapeutic listening, emotional support provided. Medications, goc, poc reviewed.    History obtained from review of EMR, discussion with primary team, and interview with family, facility staff/caregiver and/or Kara. [name].  I reviewed available labs, medications, imaging, studies and related documents from the EMR.  Records reviewed and summarized above.  Physical Exam: General: frail appearing, thin elderly pleasant female ENMT: oral mucous membranes moist CV: S1S2, RRR Pulmonary: LCTA MSK: steps with 2 person assists; +muscle wasting Neuro:  + generalized weakness,  + cognitive impairment Psych: oriented to self, place, difficulty with processing, remote and recent memory, difficulty  articulating needs Thank you for the opportunity to participate in the care of Kara [name]. Please call our office at [phone] if we can be of additional assistance.   Tarisa Paola Ihor Gully, NP

## 2022-07-24 ENCOUNTER — Ambulatory Visit (INDEPENDENT_AMBULATORY_CARE_PROVIDER_SITE_OTHER): Payer: Medicare Other | Admitting: Neurology

## 2022-07-24 ENCOUNTER — Ambulatory Visit: Payer: Medicare Other | Admitting: Physical Medicine and Rehabilitation

## 2022-07-24 ENCOUNTER — Encounter: Payer: Self-pay | Admitting: Neurology

## 2022-07-24 VITALS — BP 118/71 | HR 72 | Ht 62.0 in | Wt 110.0 lb

## 2022-07-24 DIAGNOSIS — I6601 Occlusion and stenosis of right middle cerebral artery: Secondary | ICD-10-CM | POA: Diagnosis not present

## 2022-07-24 DIAGNOSIS — G8194 Hemiplegia, unspecified affecting left nondominant side: Secondary | ICD-10-CM | POA: Diagnosis not present

## 2022-07-24 DIAGNOSIS — I482 Chronic atrial fibrillation, unspecified: Secondary | ICD-10-CM | POA: Diagnosis not present

## 2022-07-24 NOTE — Progress Notes (Signed)
[facility] [address] [place]. [place] [address] [phone]       OFFICE FOLLOW-UP NOTE  Kara [name] Date of Birth:  10-10-1922 Medical Record Number:  [id number]   HPI: Kara [name] is a 89 year old pleasant Caucasian lady seen today for initial office follow-up visit following hospital admission for stroke in December 2023.  She is accompanied by her daughter.  History is obtained from them and review of electronic medical records.  I personally reviewed pertinent available imaging films in PACS.Kara [name] is an 89 y.o. female with an extensive PMHx as listed below, including atrial fibrillation, CAD, DM, glaucoma, pacemaker, HLD, HTN, anemia and OSA who presented on 05/01/2022 from her assisted living facility via EMS as a Code Stroke with acute onset of left facial droop, left hemiplegia, left hemineglect, right gaze deviation and dysarthria.  She had a history of fall 2 years ago during which she hit her head and developed a small amount of intracerebral hemorrhage hence anticoagulation for A-fib was stopped.  NIH stroke scale on admission was 19.  CT head showed age-indeterminate infarct in the right cerebellum.  CT angiogram showed acute occlusion of the right M1/M2 junction as well as acute occlusion of the distal A2 segment of the right ACA and focal occlusion of the P2 segment of the right PCA..  Given IV TNK after careful discussion of risk benefits after extensive discussion with family about risk-benefit she went mechanical thrombectomy which was successfully performed with full revascularization of right MCA, right PCA and P2 PCA. LDL cholesterol was optimal at 58 mg percent.  Hemoglobin A1c was borderline at 6.9.  2D echo showed ejection fraction of 60 to 65%.  Left atrial size was severely dilated.  Patient was switched to Eliquis for stroke prevention and transferred to inpatient rehab.  Patient did well and after finishing patient rehab since moved into  [facility] skilled nursing facility for further ongoing rehab needs.  She unfortunately not doing too well.  She is still a two-person assist but can help with changing position.  She is unable to walk by herself.  She was actually doing well but decompensated when there was a COVID outbreak in the skilled nursing facility and she was in isolation for 10 days and did not get any therapy.  She had some intermittent episodes of confusion as well as some delusion and paranoia.  She has been started on Remeron at night as well as Celexa daughter.  She has not seen significant benefit yet.  She is tolerating Eliquis well without bruising or bleeding. ROS:   14 system review of systems is positive for, delusions, hallucinations , emotional lability, paranoia, anxiety and all other systems negative  PMH:  Past Medical History:  Diagnosis Date   Allergy    Atrial fibrillation (HCC)    CAD (coronary artery disease)    Cancer (HCC)    UTERINE   Chronic anticoagulation    Chronic diarrhea    Closed compression fracture of L2 lumbar vertebra, initial encounter ([place]) 11/08/2020   By CT scan done to evaluate severe persistent low back pain:  Impression below also notes spinal compression from bulging disk.     Superior endplate compression fracture of L2 with approximately 25% height loss and minimal, 2-3 mm retropulsion of the superior endplate.   Left-sided, nondisplaced zone 1 sacral insufficiency fracture.   Multilevel degenerative changes of the lumbar spine resulting    Closed intertrochanteric fracture of left femur ([place]) 08/22/2020  Decubitus ulcer    sacral region   Diabetes ([place])    diet controlled   Dysuria    E. coli UTI 11/21/2014   Edema of lower extremity    mainly right foot, slightly in left foot.   Facial fracture due to fall ([place]) 07/13/2020   Femur fracture, left ([place]) 06/09/2019   Fibrocystic breast disease    GERD (gastroesophageal reflux disease)    Glaucoma    Glaucoma    Hematuria     Hemorrhoids    History of colon polyps    History of pancreatitis    Hospital discharge follow-up 07/22/2020   Hyperlipidemia    Hypertension    Hypokalemia    IBS (irritable bowel syndrome)    Microscopic hematuria    Mitral valve regurgitation    Orbital fracture ([place]) 07/13/2020   Osteoarthritis    fingers   Pernicious anemia    Plantar fasciitis    Recurrent UTI    Skin cancer    Sleep apnea, obstructive    Umbilical hernia without obstruction and without gangrene 06/26/2015   Vaginal atrophy    Vitamin D deficiency    Yeast vaginitis     Social History:  Social History   Socioeconomic History   Marital status: Widowed    Spouse name: Not on file   Number of children: 3   Years of education: Not on file   Highest education level: Not on file  Occupational History   Occupation: Retired Restaurant manager, fast food - primary  Tobacco Use   Smoking status: Former   Smokeless tobacco: Never   Tobacco comments:    quit 45 years ago  Vaping Use   Vaping Use: Never used  Substance and Sexual Activity   Alcohol use: Yes    Alcohol/week: 0.0 standard drinks of alcohol    Comment: Rarely, 1-2 times a year   Drug use: No   Sexual activity: Not Currently    Birth control/protection: Surgical  Other Topics Concern   Not on file  Social History Narrative   ** Merged History Encounter **       Lives at [facility]. Born in [place]. Travels frequently.      1 daughter, 2 step children      Regular Exercise -  NO   Daily Caffeine Use:  1 coffee            Social Determinants of Health   Financial Resource Strain: Low Risk  (03/12/2022)   Overall Financial Resource Strain (CARDIA)    Difficulty of Paying Living Expenses: Not hard at all  Food Insecurity: No Food Insecurity (03/12/2022)   Hunger Vital Sign    Worried About Running Out of Food in the Last Year: Never true    Ran Out of Food in the Last Year: Never true  Transportation Needs: No Transportation  Needs (03/12/2022)   PRAPARE - Hydrologist (Medical): No    Lack of Transportation (Non-Medical): No  Physical Activity: Insufficiently Active (01/10/2022)   Exercise Vital Sign    Days of Exercise per Week: 2 days    Minutes of Exercise per Session: 10 min  Stress: No Stress Concern Present (03/12/2022)   [place]    Feeling of Stress : Not at all  Social Connections: Moderately Isolated (03/12/2022)   Social Connection and Isolation Panel [NHANES]    Frequency of Communication with Friends and Family: More  than three times a week    Frequency of Social Gatherings with Friends and Family: More than three times a week    Attends Religious Services: Never    Marine scientist or Organizations: Yes    Attends Music therapist: More than 4 times per year    Marital Status: Widowed  Intimate Partner Violence: Not At Risk (03/12/2022)   Humiliation, Afraid, Rape, and Kick questionnaire    Fear of Current or Ex-Partner: No    Emotionally Abused: No    Physically Abused: No    Sexually Abused: No    Medications:   Current Outpatient Medications on File Prior to Visit  Medication Sig Dispense Refill   acetaminophen (TYLENOL) 325 MG tablet Take 2 tablets (650 mg total) by mouth every 4 (four) hours as needed for mild pain (or temp > 37.5 C (99.5 F)).     apixaban (ELIQUIS) 2.5 MG TABS tablet Take 1 tablet (2.5 mg total) by mouth 2 (two) times daily. 60 tablet 0   Cholecalciferol (VITAMIN D) 50 MCG (2000 UT) CAPS Take 1 capsule (2,000 Units total) by mouth daily at 2 am. 30 capsule 0   citalopram (CELEXA) 10 MG tablet Take 1 tablet (10 mg total) by mouth daily. 30 tablet 0   cyanocobalamin (,VITAMIN B-12,) 1000 MCG/ML injection INJECT 1ML INTO THE MUSCLE EVERY 14 DAYS (Patient taking differently: Inject 1,000 mcg into the muscle See admin instructions. INJECT 1ML INTO THE MUSCLE  EVERY 14 DAYS) 10 mL 0   estradiol (ESTRACE) 0.1 MG/GM vaginal cream Place 1 Applicatorful vaginally every 3 (three) days as needed (vaginal dryness. To be dabbed around [place]). 42.5 g 0   lidocaine (LIDODERM) 5 % Place 1 patch onto the skin daily as needed. Remove & Discard patch within 12 hours or as directed by MD 30 patch 0   melatonin 3 MG TABS tablet Take 1 tablet (3 mg total) by mouth at bedtime. 30 tablet 0   metoprolol tartrate (LOPRESSOR) 25 MG tablet Take 0.5 tablets (12.5 mg total) by mouth every 12 (twelve) hours. 30 tablet 0   Multiple Vitamin (MULTIVITAMIN WITH MINERALS) TABS tablet Take 1 tablet by mouth daily.     pantoprazole (PROTONIX) 40 MG tablet Take 1 tablet (40 mg total) by mouth at bedtime. 30 tablet 0   traMADol (ULTRAM) 50 MG tablet Take 1 tablet (50 mg total) by mouth every 12 (twelve) hours as needed for moderate pain. 5 tablet 0   zinc oxide (BALMEX) 11.3 % CREA cream Apply 1 Application topically 2 (two) times daily. 340 g 0   No current facility-administered medications on file prior to visit.    Allergies:   Allergies  Allergen Reactions   Baycol [Cerivastatin Sodium]    Cardizem [Diltiazem Hcl]     LOWER EXTREMITY EDEMA   Crestor [Rosuvastatin Calcium] Other (See Comments)    INTOLERANT   Cymbalta [Duloxetine Hcl] Nausea Only   Dronedarone Other (See Comments)    Fatigue   Metoprolol Tartrate Other (See Comments)   Omeprazole Other (See Comments)   Pravachol     INTOLERANT   Statins Other (See Comments)   Vioxx [Rofecoxib]    Zocor [Simvastatin]     INTOLERANT    Physical Exam General: Frail cachectic looking elderly Caucasian lady, seated, in no evident distress Head: head normocephalic and atraumatic.  Neck: supple with no carotid or supraclavicular bruits Cardiovascular: regular rate and rhythm, no murmurs Musculoskeletal: no deformity Skin:  no rash/petichiae  Vascular:  Normal pulses all extremities Vitals:   07/24/22 1045  BP:  118/71  Pulse: 72   Neurologic Exam Mental Status: Awake and fully alert. Oriented to place and time. Recent and remote memory intact. Attention span, concentration and fund of knowledge appropriate. Mood and affect appropriate.  Attention, registration and recall. Cranial Nerves: Fundoscopic exam reveals sharp disc margins. Pupils equal, briskly reactive to light. Extraocular movements full without nystagmus. Visual fields show left homonymous hemianopsia to confrontation. Hearing intact. Facial sensation intact. Face, tongue, palate moves normally and symmetrically.  Motor: Spastic left hemiparesis with 3/5 strength left upper and lower extremities with drift.  Significant weakness of left grip and intrinsic hand muscles.  Mild left foot drop.  Tone is increased on the left with spasticity.  Normal strength on the right side. Sensory.:  Diminished left hemibody sensation to touch ,pinprick .position and vibratory sensation.  Coordination: Normal on the right side and intact on the left proportionate to the weakness  gait and Station: Deferred as patient cannot walk even with two-person assist Reflexes: 2+ and asymmetric and brisker on the left. Toes downgoing.   NIHSS  6 Modified Rankin  4   ASSESSMENT: 89 year old Caucasian lady with right MCA infarct due to right M1 occlusion and distal A2 occlusion P2 occlusion treated with IV thrombolysis with TNK followed by successful mechanical thrombectomy of distal right M1/MCA to the right P2/PCA occlusions with complete revascularization in December 2023 with residual significant spastic left hemiplegia.  Vascular risk factors of atrial fibrillation but not on anticoagulation due to prior history of fall and intracranial hemorrhage, coronary artery disease, hypertension, hyperlipidemia and sleep apnea     PLAN:I had a long d/w patient and her daughter about her recent embolic stroke, chronic atrial fibrillation left hemiparesis.  Risk for recurrent  stroke/TIAs, personally independently reviewed imaging studies and stroke evaluation results and answered questions.Continue Eliquis (apixaban) 2.5 mg twice daily  for secondary stroke prevention and maintain strict control of hypertension with blood pressure goal below 130/90, diabetes with hemoglobin A1c goal below 6.5% and lipids with LDL cholesterol goal below 70 mg/dL. I also advised the patient to eat a healthy diet with plenty of whole grains, cereals, fruits and vegetables, exercise regularly and maintain ideal body weight continue ongoing physical and Occupational Therapy.  Followup in the future with me in 6 months or call earlier if necessary. Greater than 50% of time during this 35 minute visit was spent on counseling,explanation of diagnosis of stroke and atrial fibrillation and benefits of Eliquis, planning of further management, discussion with patient and family and coordination of care Antony Contras, MD Note: This document was prepared with digital dictation and possible smart phrase technology. Any transcriptional errors that result from this process are unintentional

## 2022-07-24 NOTE — Patient Instructions (Signed)
I had a long d/w patient and her daughter about her recent embolic stroke, chronic atrial fibrillation left hemiparesis.  Risk for recurrent stroke/TIAs, personally independently reviewed imaging studies and stroke evaluation results and answered questions.Continue Eliquis (apixaban) 2.5 mg twice daily  for secondary stroke prevention and maintain strict control of hypertension with blood pressure goal below 130/90, diabetes with hemoglobin A1c goal below 6.5% and lipids with LDL cholesterol goal below 70 mg/dL. I also advised the patient to eat a healthy diet with plenty of whole grains, cereals, fruits and vegetables, exercise regularly and maintain ideal body weight continue ongoing physical and Occupational Therapy.  Followup in the future with me in 6 months or call earlier if necessary.  Stroke Prevention Some medical conditions and behaviors can lead to a higher chance of having a stroke. You can help prevent a stroke by eating healthy, exercising, not smoking, and managing any medical conditions you have. Stroke is a leading cause of functional impairment. Primary prevention is particularly important because a majority of strokes are first-time events. Stroke changes the lives of not only those who experience a stroke but also their family and other caregivers. How can this condition affect me? A stroke is a medical emergency and should be treated right away. A stroke can lead to brain damage and can sometimes be life-threatening. If a person gets medical treatment right away, there is a better chance of surviving and recovering from a stroke. What can increase my risk? The following medical conditions may increase your risk of a stroke: Cardiovascular disease. High blood pressure (hypertension). Diabetes. High cholesterol. Sickle cell disease. Blood clotting disorders (hypercoagulable state). Obesity. Sleep disorders (obstructive sleep apnea). Other risk factors include: Being older than  age 58. Having a history of blood clots, stroke, or mini-stroke (transient ischemic attack, TIA). Genetic factors, such as race, ethnicity, or a family history of stroke. Smoking cigarettes or using other tobacco products. Taking birth control pills, especially if you also use tobacco. Heavy use of alcohol or drugs, especially cocaine and methamphetamine. Physical inactivity. What actions can I take to prevent this? Manage your health conditions High cholesterol levels. Eating a healthy diet is important for preventing high cholesterol. If cholesterol cannot be managed through diet alone, you may need to take medicines. Take any prescribed medicines to control your cholesterol as told by your health care provider. Hypertension. To reduce your risk of stroke, try to keep your blood pressure below 130/80. Eating a healthy diet and exercising regularly are important for controlling blood pressure. If these steps are not enough to manage your blood pressure, you may need to take medicines. Take any prescribed medicines to control hypertension as told by your health care provider. Ask your health care provider if you should monitor your blood pressure at home. Have your blood pressure checked every year, even if your blood pressure is normal. Blood pressure increases with age and some medical conditions. Diabetes. Eating a healthy diet and exercising regularly are important parts of managing your blood sugar (glucose). If your blood sugar cannot be managed through diet and exercise, you may need to take medicines. Take any prescribed medicines to control your diabetes as told by your health care provider. Get evaluated for obstructive sleep apnea. Talk to your health care provider about getting a sleep evaluation if you snore a lot or have excessive sleepiness. Make sure that any other medical conditions you have, such as atrial fibrillation or atherosclerosis, are managed. Nutrition Follow  instructions from your  health care provider about what to eat or drink to help manage your health condition. These instructions may include: Reducing your daily calorie intake. Limiting how much salt (sodium) you use to 1,500 milligrams (mg) each day. Using only healthy fats for cooking, such as olive oil, canola oil, or sunflower oil. Eating healthy foods. You can do this by: Choosing foods that are high in fiber, such as whole grains, and fresh fruits and vegetables. Eating at least 5 servings of fruits and vegetables a day. Try to fill one-half of your plate with fruits and vegetables at each meal. Choosing lean protein foods, such as lean cuts of meat, poultry without skin, fish, tofu, beans, and nuts. Eating low-fat dairy products. Avoiding foods that are high in sodium. This can help lower blood pressure. Avoiding foods that have saturated fat, trans fat, and cholesterol. This can help prevent high cholesterol. Avoiding processed and prepared foods. Counting your daily carbohydrate intake.  Lifestyle If you drink alcohol: Limit how much you have to: 0-1 drink a day for women who are not pregnant. 0-2 drinks a day for men. Know how much alcohol is in your drink. In the [place] one drink equals one 12 oz bottle of beer (348m), one 5 oz glass of wine (1413m, or one 1 oz glass of hard liquor (4425m Do not use any products that contain nicotine or tobacco. These products include cigarettes, chewing tobacco, and vaping devices, such as e-cigarettes. If you need help quitting, ask your health care provider. Avoid secondhand smoke. Do not use drugs. Activity  Try to stay at a healthy weight. Get at least 30 minutes of exercise on most days, such as: Fast walking. Biking. Swimming. Medicines Take over-the-counter and prescription medicines only as told by your health care provider. Aspirin or blood thinners (antiplatelets or anticoagulants) may be recommended to reduce your risk of  forming blood clots that can lead to stroke. Avoid taking birth control pills. Talk to your health care provider about the risks of taking birth control pills if: You are over 35 63ars old. You smoke. You get very bad headaches. You have had a blood clot. Where to find more information American Stroke Association: www.strokeassociation.org Get help right away if: You or a loved one has any symptoms of a stroke. "BE FAST" is an easy way to remember the main warning signs of a stroke: B - Balance. Signs are dizziness, sudden trouble walking, or loss of balance. E - Eyes. Signs are trouble seeing or a sudden change in vision. F - Face. Signs are sudden weakness or numbness of the face, or the face or eyelid drooping on one side. A - Arms. Signs are weakness or numbness in an arm. This happens suddenly and usually on one side of the body. S - Speech. Signs are sudden trouble speaking, slurred speech, or trouble understanding what people say. T - Time. Time to call emergency services. Write down what time symptoms started. You or a loved one has other signs of a stroke, such as: A sudden, severe headache with no known cause. Nausea or vomiting. Seizure. These symptoms may represent a serious problem that is an emergency. Do not wait to see if the symptoms will go away. Get medical help right away. Call your local emergency services (911 in the [place] Do not drive yourself to the hospital. Summary You can help to prevent a stroke by eating healthy, exercising, not smoking, limiting alcohol intake, and managing any medical conditions you may have.  Do not use any products that contain nicotine or tobacco. These include cigarettes, chewing tobacco, and vaping devices, such as e-cigarettes. If you need help quitting, ask your health care provider. Remember "BE FAST" for warning signs of a stroke. Get help right away if you or a loved one has any of these signs. This information is not intended to  replace advice given to you by your health care provider. Make sure you discuss any questions you have with your health care provider. Document Revised: 11/17/2019 Document Reviewed: 12/05/2019 Elsevier Patient Education  [place].

## 2022-07-29 DIAGNOSIS — G8194 Hemiplegia, unspecified affecting left nondominant side: Secondary | ICD-10-CM | POA: Diagnosis not present

## 2022-07-29 DIAGNOSIS — I4819 Other persistent atrial fibrillation: Secondary | ICD-10-CM | POA: Diagnosis not present

## 2022-07-29 DIAGNOSIS — F3340 Major depressive disorder, recurrent, in remission, unspecified: Secondary | ICD-10-CM | POA: Diagnosis not present

## 2022-07-31 ENCOUNTER — Ambulatory Visit: Admit: 2022-07-31 | Payer: Medicare Other | Admitting: Cardiology

## 2022-07-31 DIAGNOSIS — Z45010 Encounter for checking and testing of cardiac pacemaker pulse generator [battery]: Secondary | ICD-10-CM

## 2022-07-31 SURGERY — PPM GENERATOR CHANGEOUT
Anesthesia: Moderate Sedation

## 2022-08-01 ENCOUNTER — Encounter: Payer: Self-pay | Admitting: Nurse Practitioner

## 2022-08-01 ENCOUNTER — Non-Acute Institutional Stay: Payer: Medicare Other | Admitting: Nurse Practitioner

## 2022-08-01 DIAGNOSIS — R5381 Other malaise: Secondary | ICD-10-CM | POA: Diagnosis not present

## 2022-08-01 DIAGNOSIS — Z515 Encounter for palliative care: Secondary | ICD-10-CM | POA: Diagnosis not present

## 2022-08-01 DIAGNOSIS — R630 Anorexia: Secondary | ICD-10-CM | POA: Diagnosis not present

## 2022-08-01 DIAGNOSIS — R634 Abnormal weight loss: Secondary | ICD-10-CM

## 2022-08-01 DIAGNOSIS — I639 Cerebral infarction, unspecified: Secondary | ICD-10-CM

## 2022-08-01 NOTE — Progress Notes (Signed)
[facility] Palliative Care Consult Note Telephone: [phone]  Fax: [phone]    Date of encounter: 08/01/22 11:06 AM PATIENT NAME: Kara [name] [address] [place] [id number]   [phone] (home)  DOB: March 28, 1923 MRN: [id number] PRIMARY CARE PROVIDER:    Dr Lavona [place] of [place]:    Contact Information     Name Relation Home Work Mobile   [place] Daughter [phone]     Lamm,Woody Relative   [phone]   [place] Estelle   [id number]     I met face to face with patient in facility and daughter, Delrae Rend. Palliative Care was asked to follow this patient by consultation request of  Dr Ouida Sills Georges Lynch of [facility] to address advance care planning and complex medical decision making. This is the initial visit.                               ASSESSMENT AND PLAN / RECOMMENDATIONS:  Symptom Management/Plan: 1. Advance Care Planning;  DNR 01/17/2023 weight 126 lbs 05/28/2022 weight 122.2 lbs 06/22/2022 weight 114.8 lbs 07/24/2022 weight 113.9 lbs  6 weeks ago PPS 60% Currently PPS 40%   2. Goals of Care: Goals include to maximize quality of life and symptom management. Our advance care planning conversation included a discussion about:    The value and importance of advance care planning  Exploration of personal, cultural or spiritual beliefs that might influence medical decisions  Exploration of goals of care in the event of a sudden injury or illness  Identification and preparation of a healthcare agent  Review and updating or creation of an advance directive document. 3. Palliative care encounter; Palliative care encounter; Palliative medicine team will continue to support patient, patient's family, and medical team. Visit consisted of counseling and education dealing with the complex and emotionally intense issues of symptom management and palliative care in the setting of serious  and potentially life-threatening illness   4. Debility secondary to CVA ongoing work with therapy PT/OT; ambulating in the hall with 2 person assist, walker. Fall risk; continue to monitor progress, may require LTC placement following STR  5. Weight loss; ongoing; Anorexia secondary to dysphagia from CVA; reviewed weights, appetite, continue to weight, supplements,  01/17/2023 weight 126 lbs 05/28/2022 weight 122.2 lbs 06/22/2022 weight 114.8 lbs  Follow up Palliative Care Visit: Palliative care will continue to follow for complex medical decision making, advance care planning, and clarification of goals. Return 2 to 6 weeks or prn.   I spent 48 minutes providing this consultation. More than 50% of the time in this consultation was spent in counseling and care coordination. PPS: 40%   Chief Complaint: Initial palliative consult for complex medical decision making, address goals, manage ongoing symptoms   HISTORY OF PRESENT ILLNESS:  Kara [name] is a 89 y.o. year old female  with multiple medical problems including CVA, afib/pacemaker, CAD, dysphagia, chronic pain with pain management, DM, HL, h/o uterine cancer, GERD. Hospitalized 05/05/2022 to 05/22/2022 for right middle cerebral artery stroke, discharged to STR at [facility] where she currently resides. Kara [name] requires assistance for transfers, mobility, adl's including bathing, dressing. Kara [name] is able to feed herself with tray set up ongoing declined appetite. At present Kara [name] is lying in bed, appears comfortable, daughter Delrae Rend present. We talked about how Kara [name] is feeling, ros, functional, work with therapy. We talked  about appetite, weight, expectation with therapy. Support provided. [name] and I met separately from [place], clinical update discussed, talked at length about social, family history, functional abilities and cognitive prior to hospitalization, independence residing at the apartment at [place]. We  talked about debilitating stroke which has left Kara [name] very debilitated, cognitive impairment. We talked about therapy and hope to return to her apartments. We talked about realistic expectations with current situation, we talked about therapy which maybe getting ready to be completed as she maybe approaching new baseline. We talked about an LTC vs apartment with 24 hours caregivers. We talked about increase in skill level and concerns for Kara [name] may not be able to return to the apartment with current abilities. We talked about risks and benefits. We talked about hospice benefit under Medicare program. We talked about challenges with loss of independence, depression and upcoming Neurology appointment with list of questions. We talked about role pc in poc. Discussed will f/u in 2 weeks to see how progressing with therapy, appetite, noted weight loss. Questions answered. Therapeutic listening, emotional support provided. Medications, goc, poc reviewed.    History obtained from review of EMR, discussion with primary team, and interview with family, facility staff/caregiver and/or Kara. Mitten.  I reviewed available labs, medications, imaging, studies and related documents from the EMR.  Records reviewed and summarized above.  Physical Exam: General: frail appearing, thin elderly pleasant female ENMT: oral mucous membranes moist CV: S1S2, RRR Pulmonary: LCTA MSK: steps with 2 person assists; +muscle wasting Neuro:  + generalized weakness,  + cognitive impairment Psych: oriented to self, place, difficulty with processing, remote and recent memory, difficulty articulating needs  Thank you for the opportunity to participate in the care of Kara. [name]. Please call our office at [phone] if we can be of additional assistance.   Naveah Brave Ihor Gully, NP

## 2022-08-13 ENCOUNTER — Other Ambulatory Visit: Payer: Self-pay

## 2022-08-13 DIAGNOSIS — H53462 Homonymous bilateral field defects, left side: Secondary | ICD-10-CM | POA: Diagnosis not present

## 2022-08-13 DIAGNOSIS — E119 Type 2 diabetes mellitus without complications: Secondary | ICD-10-CM | POA: Diagnosis not present

## 2022-08-13 DIAGNOSIS — H353131 Nonexudative age-related macular degeneration, bilateral, early dry stage: Secondary | ICD-10-CM | POA: Diagnosis not present

## 2022-08-13 LAB — HM DIABETES EYE EXAM

## 2022-08-13 NOTE — Patient Outreach (Signed)
First telephone outreach attempt to obtain mRS. No answer. Left message for returned call.  Cristen Bredeson [facility]-Care Management Assistant [phone]  

## 2022-08-15 ENCOUNTER — Other Ambulatory Visit: Payer: Self-pay

## 2022-08-15 NOTE — Patient Outreach (Signed)
Second telephone outreach attempt to obtain mRS. No answer. Left message for returned call.  Jazmin Ley [facility]-Care Management Assistant [phone]  

## 2022-08-18 ENCOUNTER — Other Ambulatory Visit: Payer: Self-pay

## 2022-08-18 NOTE — Patient Outreach (Signed)
Telephone outreach to patient to obtain mRS was successfully completed. MRS= 5  Kian Gamarra [facility] Care Management Assistant [phone]  

## 2022-08-21 DIAGNOSIS — G4751 Confusional arousals: Secondary | ICD-10-CM | POA: Diagnosis not present

## 2022-08-21 DIAGNOSIS — N139 Obstructive and reflux uropathy, unspecified: Secondary | ICD-10-CM | POA: Diagnosis not present

## 2022-08-23 ENCOUNTER — Observation Stay: Payer: Medicare Other

## 2022-08-23 ENCOUNTER — Emergency Department: Payer: Medicare Other

## 2022-08-23 ENCOUNTER — Inpatient Hospital Stay
Admission: EM | Admit: 2022-08-23 | Discharge: 2022-08-26 | DRG: 101 | Disposition: A | Discharge status: Other Acute Inpatient Hospital | Payer: Medicare Other | Attending: Student | Admitting: Student

## 2022-08-23 ENCOUNTER — Encounter: Payer: Self-pay | Admitting: Emergency Medicine

## 2022-08-23 DIAGNOSIS — E1165 Type 2 diabetes mellitus with hyperglycemia: Secondary | ICD-10-CM | POA: Diagnosis present

## 2022-08-23 DIAGNOSIS — E440 Moderate protein-calorie malnutrition: Secondary | ICD-10-CM | POA: Insufficient documentation

## 2022-08-23 DIAGNOSIS — N390 Urinary tract infection, site not specified: Secondary | ICD-10-CM | POA: Diagnosis present

## 2022-08-23 DIAGNOSIS — E612 Magnesium deficiency: Secondary | ICD-10-CM | POA: Insufficient documentation

## 2022-08-23 DIAGNOSIS — M545 Low back pain, unspecified: Secondary | ICD-10-CM | POA: Diagnosis present

## 2022-08-23 DIAGNOSIS — Z85828 Personal history of other malignant neoplasm of skin: Secondary | ICD-10-CM

## 2022-08-23 DIAGNOSIS — I482 Chronic atrial fibrillation, unspecified: Secondary | ICD-10-CM | POA: Diagnosis present

## 2022-08-23 DIAGNOSIS — M549 Dorsalgia, unspecified: Secondary | ICD-10-CM | POA: Diagnosis present

## 2022-08-23 DIAGNOSIS — Z79818 Long term (current) use of other agents affecting estrogen receptors and estrogen levels: Secondary | ICD-10-CM

## 2022-08-23 DIAGNOSIS — Z79899 Other long term (current) drug therapy: Secondary | ICD-10-CM

## 2022-08-23 DIAGNOSIS — E119 Type 2 diabetes mellitus without complications: Secondary | ICD-10-CM

## 2022-08-23 DIAGNOSIS — Z800 Family history of malignant neoplasm of digestive organs: Secondary | ICD-10-CM

## 2022-08-23 DIAGNOSIS — I495 Sick sinus syndrome: Secondary | ICD-10-CM | POA: Diagnosis present

## 2022-08-23 DIAGNOSIS — I10 Essential (primary) hypertension: Secondary | ICD-10-CM | POA: Diagnosis not present

## 2022-08-23 DIAGNOSIS — Z1611 Resistance to penicillins: Secondary | ICD-10-CM | POA: Diagnosis present

## 2022-08-23 DIAGNOSIS — I69354 Hemiplegia and hemiparesis following cerebral infarction affecting left non-dominant side: Secondary | ICD-10-CM

## 2022-08-23 DIAGNOSIS — F419 Anxiety disorder, unspecified: Secondary | ICD-10-CM | POA: Diagnosis present

## 2022-08-23 DIAGNOSIS — E876 Hypokalemia: Secondary | ICD-10-CM | POA: Diagnosis present

## 2022-08-23 DIAGNOSIS — Z9049 Acquired absence of other specified parts of digestive tract: Secondary | ICD-10-CM

## 2022-08-23 DIAGNOSIS — Z1623 Resistance to quinolones and fluoroquinolones: Secondary | ICD-10-CM | POA: Diagnosis present

## 2022-08-23 DIAGNOSIS — Z8249 Family history of ischemic heart disease and other diseases of the circulatory system: Secondary | ICD-10-CM

## 2022-08-23 DIAGNOSIS — Z66 Do not resuscitate: Secondary | ICD-10-CM | POA: Diagnosis present

## 2022-08-23 DIAGNOSIS — R569 Unspecified convulsions: Secondary | ICD-10-CM

## 2022-08-23 DIAGNOSIS — Z9861 Coronary angioplasty status: Secondary | ICD-10-CM

## 2022-08-23 DIAGNOSIS — I639 Cerebral infarction, unspecified: Secondary | ICD-10-CM | POA: Diagnosis present

## 2022-08-23 DIAGNOSIS — Z841 Family history of disorders of kidney and ureter: Secondary | ICD-10-CM

## 2022-08-23 DIAGNOSIS — Z87891 Personal history of nicotine dependence: Secondary | ICD-10-CM

## 2022-08-23 DIAGNOSIS — R0689 Other abnormalities of breathing: Secondary | ICD-10-CM | POA: Diagnosis not present

## 2022-08-23 DIAGNOSIS — R404 Transient alteration of awareness: Secondary | ICD-10-CM | POA: Diagnosis not present

## 2022-08-23 DIAGNOSIS — D6869 Other thrombophilia: Secondary | ICD-10-CM | POA: Insufficient documentation

## 2022-08-23 DIAGNOSIS — I2510 Atherosclerotic heart disease of native coronary artery without angina pectoris: Secondary | ICD-10-CM | POA: Diagnosis present

## 2022-08-23 DIAGNOSIS — R627 Adult failure to thrive: Secondary | ICD-10-CM | POA: Insufficient documentation

## 2022-08-23 DIAGNOSIS — G4733 Obstructive sleep apnea (adult) (pediatric): Secondary | ICD-10-CM | POA: Diagnosis present

## 2022-08-23 DIAGNOSIS — Z833 Family history of diabetes mellitus: Secondary | ICD-10-CM

## 2022-08-23 DIAGNOSIS — Z6820 Body mass index (BMI) 20.0-20.9, adult: Secondary | ICD-10-CM

## 2022-08-23 DIAGNOSIS — H409 Unspecified glaucoma: Secondary | ICD-10-CM | POA: Diagnosis present

## 2022-08-23 DIAGNOSIS — G9389 Other specified disorders of brain: Secondary | ICD-10-CM | POA: Diagnosis present

## 2022-08-23 DIAGNOSIS — R000 Tachycardia, unspecified: Secondary | ICD-10-CM | POA: Diagnosis not present

## 2022-08-23 DIAGNOSIS — Z881 Allergy status to other antibiotic agents status: Secondary | ICD-10-CM

## 2022-08-23 DIAGNOSIS — Z950 Presence of cardiac pacemaker: Secondary | ICD-10-CM

## 2022-08-23 DIAGNOSIS — G4089 Other seizures: Secondary | ICD-10-CM | POA: Diagnosis not present

## 2022-08-23 DIAGNOSIS — I4821 Permanent atrial fibrillation: Secondary | ICD-10-CM | POA: Diagnosis present

## 2022-08-23 DIAGNOSIS — E785 Hyperlipidemia, unspecified: Secondary | ICD-10-CM | POA: Diagnosis present

## 2022-08-23 DIAGNOSIS — K219 Gastro-esophageal reflux disease without esophagitis: Secondary | ICD-10-CM | POA: Diagnosis present

## 2022-08-23 DIAGNOSIS — Z888 Allergy status to other drugs, medicaments and biological substances status: Secondary | ICD-10-CM

## 2022-08-23 DIAGNOSIS — Z7901 Long term (current) use of anticoagulants: Secondary | ICD-10-CM

## 2022-08-23 DIAGNOSIS — I340 Nonrheumatic mitral (valve) insufficiency: Secondary | ICD-10-CM | POA: Diagnosis present

## 2022-08-23 DIAGNOSIS — B9620 Unspecified Escherichia coli [E. coli] as the cause of diseases classified elsewhere: Secondary | ICD-10-CM | POA: Diagnosis present

## 2022-08-23 DIAGNOSIS — Z8542 Personal history of malignant neoplasm of other parts of uterus: Secondary | ICD-10-CM

## 2022-08-23 LAB — CBC WITH DIFFERENTIAL/PLATELET
Abs Immature Granulocytes: 0.02 10*3/uL (ref 0.00–0.07)
Basophils Absolute: 0 10*3/uL (ref 0.0–0.1)
Basophils Relative: 0 %
Eosinophils Absolute: 0.1 10*3/uL (ref 0.0–0.5)
Eosinophils Relative: 2 %
HCT: 37.3 % (ref 36.0–46.0)
Hemoglobin: 12.4 g/dL (ref 12.0–15.0)
Immature Granulocytes: 0 %
Lymphocytes Relative: 32 %
Lymphs Abs: 1.7 10*3/uL (ref 0.7–4.0)
MCH: 30.9 pg (ref 26.0–34.0)
MCHC: 33.2 g/dL (ref 30.0–36.0)
MCV: 93 fL (ref 80.0–100.0)
Monocytes Absolute: 0.4 10*3/uL (ref 0.1–1.0)
Monocytes Relative: 7 %
Neutro Abs: 3.2 10*3/uL (ref 1.7–7.7)
Neutrophils Relative %: 59 %
Platelets: 206 10*3/uL (ref 150–400)
RBC: 4.01 MIL/uL (ref 3.87–5.11)
RDW: 14.9 % (ref 11.5–15.5)
WBC: 5.3 10*3/uL (ref 4.0–10.5)
nRBC: 0 % (ref 0.0–0.2)

## 2022-08-23 LAB — COMPREHENSIVE METABOLIC PANEL
ALT: 8 U/L (ref 0–44)
AST: 31 U/L (ref 15–41)
Albumin: 2.5 g/dL — ABNORMAL LOW (ref 3.5–5.0)
Alkaline Phosphatase: 54 U/L (ref 38–126)
Anion gap: 9 (ref 5–15)
BUN: 12 mg/dL (ref 8–23)
CO2: 29 mmol/L (ref 22–32)
Calcium: 7.8 mg/dL — ABNORMAL LOW (ref 8.9–10.3)
Chloride: 99 mmol/L (ref 98–111)
Creatinine, Ser: 0.72 mg/dL (ref 0.44–1.00)
GFR, Estimated: >60 mL/min (ref >60)
Glucose, Bld: 173 mg/dL — ABNORMAL HIGH (ref 70–99)
Potassium: 2.6 mmol/L — CL (ref 3.5–5.1)
Sodium: 137 mmol/L (ref 135–145)
Total Bilirubin: 1.8 mg/dL — ABNORMAL HIGH (ref 0.3–1.2)
Total Protein: 5.6 g/dL — ABNORMAL LOW (ref 6.5–8.1)

## 2022-08-23 LAB — MAGNESIUM: Magnesium: 1.5 mg/dL — ABNORMAL LOW (ref 1.7–2.4)

## 2022-08-23 MED ORDER — LEVETIRACETAM IN NACL 1000 MG/100ML IV SOLN
1000.0000 mg | Freq: Once | INTRAVENOUS | Status: AC
  Administered 2022-08-23: 1000 mg via INTRAVENOUS
  Filled 2022-08-23: qty 100

## 2022-08-23 MED ORDER — MAGNESIUM SULFATE 2 GM/50ML IV SOLN
2.0000 g | Freq: Once | INTRAVENOUS | Status: AC
  Administered 2022-08-24: 2 g via INTRAVENOUS
  Filled 2022-08-23: qty 50

## 2022-08-23 MED ORDER — ORAL CARE MOUTH RINSE
15.0000 mL | OROMUCOSAL | Status: DC
  Administered 2022-08-24: 15 mL via OROMUCOSAL
  Filled 2022-08-23 (×5): qty 15

## 2022-08-23 MED ORDER — SODIUM CHLORIDE 0.9 % IV SOLN
75.0000 mL/h | INTRAVENOUS | Status: DC
  Administered 2022-08-24: 75 mL/h via INTRAVENOUS

## 2022-08-23 MED ORDER — INSULIN ASPART 100 UNIT/ML IJ SOLN
0.0000 [IU] | Freq: Three times a day (TID) | INTRAMUSCULAR | Status: DC
  Administered 2022-08-24 – 2022-08-26 (×3): 2 [IU] via SUBCUTANEOUS
  Filled 2022-08-23 (×2): qty 1

## 2022-08-23 MED ORDER — POTASSIUM CHLORIDE 10 MEQ/100ML IV SOLN
10.0000 meq | INTRAVENOUS | Status: AC
  Administered 2022-08-23 (×3): 10 meq via INTRAVENOUS
  Filled 2022-08-23 (×4): qty 100

## 2022-08-23 MED ORDER — INSULIN ASPART 100 UNIT/ML IJ SOLN: 0.0000 [IU] | Freq: Every day | INTRAMUSCULAR | Status: DC

## 2022-08-23 MED ORDER — LEVETIRACETAM ER 500 MG PO TB24
500.0000 mg | ORAL_TABLET | Freq: Every day | ORAL | Status: DC
  Administered 2022-08-24: 500 mg via ORAL
  Filled 2022-08-23: qty 1

## 2022-08-23 MED ORDER — ORAL CARE MOUTH RINSE: 15.0000 mL | OROMUCOSAL | Status: DC | PRN

## 2022-08-23 NOTE — ED Triage Notes (Addendum)
Pt presents from [facility] nursing facility via [facility] following a focal seizure lasting 10-15 mins per staff. Enroute pt received 4mg  of versed IV and was placed on 4L [place] - no hx of O2 dependency. Per EMS, the seizures were localized to the L arm and leg. Of note, the patient was dx with a UTI but refused antibiotics due to them causing a yeast infection. Denies CP or SOB.

## 2022-08-23 NOTE — Assessment & Plan Note (Signed)
Hypokalemia Potassium is 2.6 For runs of K riders have been ordered Will likely need more after magnesium is repleted

## 2022-08-23 NOTE — Assessment & Plan Note (Signed)
Due to AF with elevated CHADSVASC Continue home apixaban

## 2022-08-23 NOTE — Assessment & Plan Note (Signed)
Suspected urinary tract infection Denies current symptoms although reportedly had this at the nursing home and declined treatment is an equivocal historian so would favor treating empirically at present if urinalysis is consistent Urinalysis pending

## 2022-08-23 NOTE — Assessment & Plan Note (Signed)
  Lower back pain We will obtain an x-ray to exclude a compression fracture

## 2022-08-23 NOTE — Assessment & Plan Note (Signed)
  Failure to thrive Holding Celexa and mirtazapine in the setting of polypharmacy and neurological symptoms Daughter does not believe it is positively affecting her care at the current time

## 2022-08-23 NOTE — Assessment & Plan Note (Signed)
Hypomagnesia Magnesium is 1.5 Replacement has been ordered

## 2022-08-23 NOTE — Assessment & Plan Note (Signed)
Ischemic stroke by history Residual LUE and LLE weakness right M1/M2 junction as well as acute occlusion of the distal A2 segment of the right ACA and focal occlusion of the P2 segment of the right PCA and received TPA Continue the patient's home apixaban, of note the patient does not have home aspirin

## 2022-08-23 NOTE — Assessment & Plan Note (Signed)
Type 2 diabetes mellitus with hyperglycemia Glucose is 173 No current home meds We will do low-dose sliding scale before meals and at bedtime

## 2022-08-23 NOTE — Assessment & Plan Note (Signed)
Permanent atrial fibrillation With pacer, elevated CHA2DS2-VASc and stroke history Continue the patient's home apixaban ECG with Capture Spikes/paced currently

## 2022-08-23 NOTE — Assessment & Plan Note (Signed)
  Hypertension Given soft pressures we will hold the patient's home antihypertensives

## 2022-08-23 NOTE — ED Provider Notes (Signed)
[facility] Provider Note    Event Date/Time   First MD Initiated Contact with Patient 08/23/22 1950     (approximate)   History   Seizures   HPI  Kara [name] is a 89 y.o. female  who presents to the emergency department today via EMS because of concern for seizure like activity. The patient is unable to give any history secondary to sedation. The patient apparently had a stroke that left her with left sided deficits. The patient then today started having seizure like activity to her left side. When ems got there they stated the patient was awake and alert and able to talk through the seizure like activity. Apparently the patient was recently diagnosed with a UTI but declined treatment. She received 4 mg of versed on route.        Physical Exam   Triage Vital Signs: ED Triage Vitals [08/23/22 1948]  Enc Vitals Group     BP      Pulse      Resp      Temp      Temp src      SpO2 100 %     Weight      Height      Head Circumference      Peak Flow      Pain Score      Pain Loc      Pain Edu?      Excl. in GC?     Most recent vital signs: Vitals:   08/23/22 1948  SpO2: 100%   General: Somnolent. CV:  Good peripheral perfusion. Tachycardia Resp:  Normal effort. Lungs clear. Abd:  No distention.  Other:  Rhythmic contraction of left upper arm and left neck muscles.    ED Results / Procedures / Treatments   Labs (all labs ordered are listed, but only abnormal results are displayed) Labs Reviewed  COMPREHENSIVE METABOLIC PANEL - Abnormal; Notable for the following components:      Result Value   Potassium 2.6 (*)    Glucose, Bld 173 (*)    Calcium 7.8 (*)    Total Protein 5.6 (*)    Albumin 2.5 (*)    Total Bilirubin 1.8 (*)    All other components within normal limits  MAGNESIUM - Abnormal; Notable for the following components:   Magnesium 1.5 (*)    All other components within normal limits  CBC WITH DIFFERENTIAL/PLATELET   URINALYSIS, ROUTINE W REFLEX MICROSCOPIC     EKG  I, Phineas Semen, attending physician, personally viewed and interpreted this EKG  EKG Time: 1953 Rate: 120 Rhythm: atrial fibrillation Axis: right axis deviation Intervals: qtc 569 QRS: wide ST changes: no st elevation Impression: abnormal ekg    RADIOLOGY I independently interpreted and visualized the CT head. My interpretation: No acute bleed Radiology interpretation:  IMPRESSION:  1. No acute intracranial process.  2. Interval development of encephalomalacia in the right parietal,  occipital and temporal region compatible with patient's known prior  MCA distribution infarct.  3. Diffuse brain atrophy and chronic ischemic changes are similar to  prior.     PROCEDURES:  Critical Care performed: Yes  CRITICAL CARE Performed by: Phineas Semen   Total critical care time: 30 minutes  Critical care time was exclusive of separately billable procedures and treating other patients.  Critical care was necessary to treat or prevent imminent or life-threatening deterioration.  Critical care was time spent personally by me on the following  activities: development of treatment plan with patient and/or surrogate as well as nursing, discussions with consultants, evaluation of patient's response to treatment, examination of patient, obtaining history from patient or surrogate, ordering and performing treatments and interventions, ordering and review of laboratory studies, ordering and review of radiographic studies, pulse oximetry and re-evaluation of patient's condition.   Procedures    MEDICATIONS ORDERED IN ED: Medications  levETIRAcetam (KEPPRA) IVPB 1000 mg/100 mL premix (has no administration in time range)     IMPRESSION / MDM / ASSESSMENT AND PLAN / ED COURSE  I reviewed the triage vital signs and the nursing notes.                              Differential diagnosis includes, but is not limited to,  intracranial bleed, new onset epilepsy, electrolyte abnormality.  Patient's presentation is most consistent with acute presentation with potential threat to life or bodily function.   The patient is on the cardiac monitor to evaluate for evidence of arrhythmia and/or significant heart rate changes.  Patient presented to the emergency department today because of concerns for new onset focal seizures.  At the time of my initial exam patient was quite sedated and unable to give any history.  There was still rhythmic contractions of the left upper extremity muscles and left neck muscles.  Given concerns for continued seizure-like activity patient was given IV Keppra.  This did seem to stop the seizure-like activity.  Head CT was obtained to evaluate for any acute intracranial process.  Fortunately no bleed identified.  Blood work does show significant hypokalemia as well as slightly low Yahoo level.  Sodium level however was within normal limits.  At this time somewhat unclear etiology of the patient's seizure activity.  While here in the emergency department she did become much more awake and alert.  I do think the benzos started to wear off.  At this time given in new onset seizure I do think patient would benefit from further workup and management.  Discussed with Dr. Dorna Mai with the hospitalist service who will plan on admission.      FINAL CLINICAL IMPRESSION(S) / ED DIAGNOSES   Final diagnoses:  Seizure-like activity    Note:  This document was prepared using Dragon voice recognition software and may include unintentional dictation errors.    Phineas Semen, MD 08/23/22 2329

## 2022-08-23 NOTE — Assessment & Plan Note (Addendum)
  Partial seizures, suspected Patient appears to have focal seizures involving the left upper extremity, the patient's brain abnormalities from her previous stroke places her at higher risk for seizure No reoccurrence after receiving Versed as well as Keppra CT of the brain did not reveal any acute changes and the increased weakness of the left lower extremity reportedly has been for 3 days making CVA unlikely Plan: Seizure precautions serial neurological exam, continue Keppra pending neurological consultation in the a.m., spot EEG, MRI of the brain was deferred given the current pacemaker and the clear CT with symptoms greater than 3 days duration will defer to neurology consultation if transfer is warranted for repeat MRI, PT OT

## 2022-08-23 NOTE — ED Notes (Signed)
@   bedside are Kara [name] and Kara [name] from the village at [facility] and daughter.

## 2022-08-23 NOTE — H&P (Addendum)
History and Physical    Patient: Kara [name] SWH:[id number] DOB: [birth year] DOA: 08/23/2022 DOS: the patient was seen and examined on 08/23/2022 PCP: Sherlene Shams, MD  Patient coming from: SNF  Chief Complaint:  Chief Complaint  Patient presents with   Seizures   HPI: This is a pleasant 89 year old with a past medical history of atrial fibrillation, CAD, DM, glaucoma, pacemaker, HLD, HTN, anemia and OSA in addition to ischemic stroke 04/2022 found with right M1/M2 junction as well as acute occlusion of the distal A2 segment of the right ACA and focal occlusion of the P2 segment of the right PCA and received TPA.  At the time of interview of the patient's daughter as well as staff from the patient's skilled nursing facility were at the bedside.  They report the patient today acutely had jerking movements of her face and left side of her body although remained consciousness during the whole time.  The patient received Versed in the field for suspected seizures and this recurred in the emergency department with my ER colleague to witnessing the event and giving empiric Keppra.  Collateral history from the skilled nursing facility employee at the bedside reports she has had increased weakness on her left side for the last 3 days namely her left lower extremity and the daughter reports the patient has been participating less for the last month and has not been thriving she does mention an association with recent initiation of Celexa and also notes the patient was started on mirtazapine for sleep and appetite regulation at the skilled nursing facility.  Of note reportedly the patient was diagnosed recently with a urinary tract infection but declined antibiotics but denies any symptoms at the current moment.  The patient follows commands but is not a reliable historian. SNF employee also reports she was complaining of lower back pain but no falls recently for last 3 days. DNR confirmed at bedside and paper  scan reviewed.  Past medical records reviewed and summarized: The patient had a visit with the neurologist on 07/24/2022 where it was noted the patient was unable to walk by herself with intermittent episodes of confusion and delusion.  CT Head: 2. Interval development of encephalomalacia in the right parietal, occipital and temporal region compatible with patient's known prior MCA distribution infarct. 3. Diffuse brain atrophy and chronic ischemic changes are similar to prior.  ECG tracing independently reviewed and interpreted with intermittent capture of paced rhythm with ventricular rate in the 80s QTc is 555  Calcium is 7.8, corrected to 9 with the patient's 2.5 albumin.  Review of Systems: Cannot Acquire given the patient's memory impairment  Past Medical History:  Diagnosis Date   Allergy    Atrial fibrillation    CAD (coronary artery disease)    Cancer    UTERINE   Chronic anticoagulation    Chronic diarrhea    Closed compression fracture of L2 lumbar vertebra, initial encounter 11/08/2020   By CT scan done to evaluate severe persistent low back pain:  Impression below also notes spinal compression from bulging disk.     Superior endplate compression fracture of L2 with approximately 25% height loss and minimal, 2-3 mm retropulsion of the superior endplate.   Left-sided, nondisplaced zone 1 sacral insufficiency fracture.   Multilevel degenerative changes of the lumbar spine resulting    Closed intertrochanteric fracture of left femur 08/22/2020   Decubitus ulcer    sacral region   Diabetes    diet controlled   Dysuria  E. coli UTI 11/21/2014   Edema of lower extremity    mainly right foot, slightly in left foot.   Facial fracture due to fall 07/13/2020   Femur fracture, left 06/09/2019   Fibrocystic breast disease    GERD (gastroesophageal reflux disease)    Glaucoma    Glaucoma    Hematuria    Hemorrhoids    History of colon polyps    History of pancreatitis     Hospital discharge follow-up 07/22/2020   Hyperlipidemia    Hypertension    Hypokalemia    IBS (irritable bowel syndrome)    Microscopic hematuria    Mitral valve regurgitation    Orbital fracture 07/13/2020   Osteoarthritis    fingers   Pernicious anemia    Plantar fasciitis    Recurrent UTI    Skin cancer    Sleep apnea, obstructive    Umbilical hernia without obstruction and without gangrene 06/26/2015   Vaginal atrophy    Vitamin D deficiency    Yeast vaginitis    Past Surgical History:  Procedure Laterality Date   ABDOMINAL HYSTERECTOMY  1980's   ABDOMINAL SURGERY     for villous polyp,,,many years ago   APPENDECTOMY  1940's   ASCAD, s/p PTCA  11/28/2005   MID LESION    BREAST BIOPSY Left 1970's   CARPAL TUNNEL RELEASE Right 12/13/2015   Procedure: CARPAL TUNNEL RELEASE;  Surgeon: Juanell Fairly, MD;  Location: [facility];  Service: Orthopedics;  Laterality: Right;   COLECTOMY  2015   INTRAMEDULLARY (IM) NAIL INTERTROCHANTERIC Left 06/11/2019   Procedure: INTRAMEDULLARY (IM) NAIL INTERTROCHANTRIC;  Surgeon: Deeann Saint, MD;  Location: [facility];  Service: Orthopedics;  Laterality: Left;   IR CT HEAD LTD  05/01/2022   IR PERCUTANEOUS ART THROMBECTOMY/INFUSION INTRACRANIAL INC DIAG ANGIO  05/01/2022   IR US GUIDE VASC ACCESS RIGHT  05/01/2022   KYPHOPLASTY N/A 11/15/2020   Procedure: Nicki Reaper;  Surgeon: Kennedy Bucker, MD;  Location: [facility];  Service: Orthopedics;  Laterality: N/A;   PACEMAKER PLACEMENT     radation     for uterine cance   RADIOLOGY WITH ANESTHESIA N/A 05/01/2022   Procedure: IR WITH ANESTHESIA;  Surgeon: Radiologist, Medication, MD;  Location: [facility];  Service: Radiology;  Laterality: N/A;   REFRACTIVE SURGERY     for bilateral glaumoma   SACROPLASTY N/A 11/15/2020   Procedure: SACROPLASTY;  Surgeon: Kennedy Bucker, MD;  Location: [facility];  Service: Orthopedics;  Laterality: N/A;   TONSILLECTOMY  1936   Social History:  reports that she has quit  smoking. She has never used smokeless tobacco. She reports current alcohol use. She reports that she does not use drugs.  Allergies  Allergen Reactions   Baycol [Cerivastatin Sodium]    Cardizem [Diltiazem Hcl]     LOWER EXTREMITY EDEMA   Crestor [Rosuvastatin Calcium] Other (See Comments)    INTOLERANT   Cymbalta [Duloxetine Hcl] Nausea Only   Dronedarone Other (See Comments)    Fatigue   Metoprolol Tartrate Other (See Comments)   Omeprazole Other (See Comments)   Pravachol     INTOLERANT   Statins Other (See Comments)   Vioxx [Rofecoxib]    Zocor [Simvastatin]     INTOLERANT    Family History  Problem Relation Age of Onset   Coronary artery disease Father    Kidney disease Father    Cancer Sister    Diabetes Other    Cancer Mother        COLON  Bladder Cancer Neg Hx    Breast cancer Neg Hx     Prior to Admission medications   Medication Sig Start Date End Date Taking? Authorizing Provider  acetaminophen (TYLENOL) 325 MG tablet Take 2 tablets (650 mg total) by mouth every 4 (four) hours as needed for mild pain (or temp > 37.5 C (99.5 F)). 05/21/22  Yes Angiulli, Mcarthur Rossetti, PA-C  apixaban (ELIQUIS) 2.5 MG TABS tablet Take 1 tablet (2.5 mg total) by mouth 2 (two) times daily. 05/21/22  Yes Angiulli, Mcarthur Rossetti, PA-C  estradiol (ESTRACE) 0.1 MG/GM vaginal cream Place 1 Applicatorful vaginally every 3 (three) days as needed (vaginal dryness. To be dabbed around [place]). 05/21/22  Yes Angiulli, Mcarthur Rossetti, PA-C  melatonin 3 MG TABS tablet Take 1 tablet (3 mg total) by mouth at bedtime. 05/21/22  Yes Angiulli, Mcarthur Rossetti, PA-C  Multiple Vitamin (MULTIVITAMIN WITH MINERALS) TABS tablet Take 1 tablet by mouth daily. 05/21/22  Yes Angiulli, Mcarthur Rossetti, PA-C  pantoprazole (PROTONIX) 40 MG tablet Take 1 tablet (40 mg total) by mouth at bedtime. 05/21/22  Yes Angiulli, Mcarthur Rossetti, PA-C  Cholecalciferol (VITAMIN D) 50 MCG (2000 UT) CAPS Take 1 capsule (2,000 Units total) by mouth daily at 2 am. Patient  not taking: Reported on 08/23/2022 05/21/22   Charlton Amor, PA-C    Physical Exam: Vitals:   08/23/22 1948 08/23/22 1955 08/23/22 1956 08/23/22 2300  BP:  124/70  (!) 152/73  Pulse:  78  70  Resp:  20  (!) 22  Temp:  98.6 F (37 C)    TempSrc:  Rectal    SpO2: 100% 100%  97%  Weight:   49.7 kg   Height:   5\' 2"  (1.575 m)   Constitutional:  Vital Signs as per Above [facility] than three noted] No Acute Distress Eyes:  Pink Conjunctiva and no Ptosis ENMT:   External Appearance of Ears and Nose without obvious deformity, masses or scar Neck:     Trachea Midline, Neck Symmetric Respiratory:   Respiratory Effort Normal: No Use of Respiratory Muscles,No  Intercostal Retractions             Lungs Clear to Auscultation Bilaterally Cardiovascular:   Heart Auscultated: Irregularly Irregular without any added sounds or murmurs              No Lower Extremity Edema Gastrointestinal:  Abdomen soft and nontender without palpable masses, guarding or rebound  No Palpable Splenomegaly or Hepatomegaly Lymphatic:  No Palpable Cervical Lymphadenopathy or Palpable No Axillary Lymphadenopathy Neurologic:                III, IV, VI: EOM intact, no gaze preference or deviation, no nystagmus. V: normal sensation in V1, V2, and V3 segments bilaterally VII: no asymmetry, no nasolabial fold flattening VIII: normal hearing to speech XI: 5/5 head turn and 5/5 shoulder shrug bilaterally MOTOR: 4/5 muscle power in Rt shoulder abductors/adductors, elbow flexors/extensors, wrist flexors/extensors, .  4/5 in Rt hipflexors/extensors, knee flexors/extensors, ankle dorsiflexors and planter flexors. 1/5 muscle power in Lt shoulder abductors/adductors, elbow flexors/extensors, wrist flexors/extensors,   1/5 in Lt hipflexors/extensors, knee flexors/extensors, ankle dorsiflexors and planter flexors. Psychiatric:  Patient Orientated to Person but not time or place Patient with appropriate mood and affect Recent and  Remote Memory Impaired SENSORY: Patient could not cooperate    Data Reviewed: CT Head: 2. Interval development of encephalomalacia in the right parietal, occipital and temporal region compatible with patient's known prior MCA distribution infarct. 3.  Diffuse brain atrophy and chronic ischemic changes are similar to prior.  ECG tracing independently reviewed and interpreted with intermittent capture of paced rhythm with ventricular rate in the 80s QTc is 555  Calcium is 7.8, corrected to 9 with the patient's 2.5 albumin   Assessment and Plan: * Partial seizure  Partial seizures, suspected Patient appears to have focal seizures involving the left upper extremity, the patient's brain abnormalities from her previous stroke places her at higher risk for seizure No reoccurrence after receiving Versed as well as Keppra CT of the brain did not reveal any acute changes and the increased weakness of the left lower extremity reportedly has been for 3 days making CVA unlikely Plan: Seizure precautions serial neurological exam, continue Keppra pending neurological consultation in the a.m., spot EEG, MRI of the brain was deferred given the current pacemaker and the clear CT with symptoms greater than 3 days duration will defer to neurology consultation if transfer is warranted for repeat MRI, PT OT   Secondary hypercoagulable state Due to AF with elevated CHADSVASC Continue home apixaban  Failure to thrive in adult  Failure to thrive Holding Celexa and mirtazapine in the setting of polypharmacy and neurological symptoms Daughter does not believe it is positively affecting her care at the current time   Magnesium deficiency Hypomagnesia Magnesium is 1.5 Replacement has been ordered  Type 2 diabetes mellitus Type 2 diabetes mellitus with hyperglycemia Glucose is 173 No current home meds We will do low-dose sliding scale before meals and at bedtime  Ischemic stroke Ischemic stroke  by history Residual LUE and LLE weakness right M1/M2 junction as well as acute occlusion of the distal A2 segment of the right ACA and focal occlusion of the P2 segment of the right PCA and received TPA Continue the patient's home apixaban, of note the patient does not have home aspirin  Back pain  Lower back pain We will obtain an x-ray to exclude a compression fracture  Atrial fibrillation, chronic Permanent atrial fibrillation With pacer, elevated CHA2DS2-VASc and stroke history Continue the patient's home apixaban ECG with Capture Spikes/paced currently  Hypokalemia Hypokalemia Potassium is 2.6 For runs of K riders have been ordered Will likely need more after magnesium is repleted  Essential hypertension  Hypertension Given soft pressures we will hold the patient's home antihypertensives  Urinary tract infection Suspected urinary tract infection Denies current symptoms although reportedly had this at the nursing home and declined treatment is an equivocal historian so would favor treating empirically at present if urinalysis is consistent Urinalysis pending  Malnutrition Albumin is 2.5 the patient has temporalis wasting in chronic illness state  Dietitian consult Insufficient Energy Intake  Loss of Muscle Mass Loss of Subcutaneous Fat Suspect Diminished functional status as measured by hand grip strength with PT pending   Advance Care Planning:   Code Status: DNR   Consults: Neurology should be contacted in AM [Only Tele for Stroke Overnight]  Family Communication: Daughter from [place] at bedside  Severity of Illness: The appropriate patient status for this patient is OBSERVATION. Observation status is judged to be reasonable and necessary in order to provide the required intensity of service to ensure the patient's safety. The patient's presenting symptoms, physical exam findings, and initial radiographic and laboratory data in the context of their medical  condition is felt to place them at decreased risk for further clinical deterioration. Furthermore, it is anticipated that the patient will be medically stable for discharge from the hospital within 2 midnights of  admission.   11:35 S/T Staff Re: XR : difficulty with patient's current condition and get XR with Lyte infusions etc, advised can defer as r/o compression # not urgent  Author: Princess Bruins, MD 08/23/2022 11:33 PM  For on call review [email or web address].

## 2022-08-24 ENCOUNTER — Observation Stay: Payer: Medicare Other

## 2022-08-24 ENCOUNTER — Encounter: Payer: Self-pay | Admitting: Internal Medicine

## 2022-08-24 ENCOUNTER — Other Ambulatory Visit: Payer: Self-pay

## 2022-08-24 DIAGNOSIS — K219 Gastro-esophageal reflux disease without esophagitis: Secondary | ICD-10-CM | POA: Diagnosis not present

## 2022-08-24 DIAGNOSIS — E785 Hyperlipidemia, unspecified: Secondary | ICD-10-CM | POA: Diagnosis present

## 2022-08-24 DIAGNOSIS — N390 Urinary tract infection, site not specified: Secondary | ICD-10-CM | POA: Diagnosis present

## 2022-08-24 DIAGNOSIS — M4316 Spondylolisthesis, lumbar region: Secondary | ICD-10-CM | POA: Diagnosis not present

## 2022-08-24 DIAGNOSIS — I495 Sick sinus syndrome: Secondary | ICD-10-CM | POA: Diagnosis present

## 2022-08-24 DIAGNOSIS — E876 Hypokalemia: Secondary | ICD-10-CM | POA: Diagnosis present

## 2022-08-24 DIAGNOSIS — Z66 Do not resuscitate: Secondary | ICD-10-CM | POA: Diagnosis present

## 2022-08-24 DIAGNOSIS — R569 Unspecified convulsions: Secondary | ICD-10-CM | POA: Diagnosis present

## 2022-08-24 DIAGNOSIS — I10 Essential (primary) hypertension: Secondary | ICD-10-CM | POA: Diagnosis present

## 2022-08-24 DIAGNOSIS — Z79899 Other long term (current) drug therapy: Secondary | ICD-10-CM | POA: Diagnosis not present

## 2022-08-24 DIAGNOSIS — I6930 Unspecified sequelae of cerebral infarction: Secondary | ICD-10-CM | POA: Diagnosis not present

## 2022-08-24 DIAGNOSIS — B9620 Unspecified Escherichia coli [E. coli] as the cause of diseases classified elsewhere: Secondary | ICD-10-CM | POA: Diagnosis present

## 2022-08-24 DIAGNOSIS — I2510 Atherosclerotic heart disease of native coronary artery without angina pectoris: Secondary | ICD-10-CM | POA: Diagnosis present

## 2022-08-24 DIAGNOSIS — E440 Moderate protein-calorie malnutrition: Secondary | ICD-10-CM | POA: Diagnosis present

## 2022-08-24 DIAGNOSIS — I4821 Permanent atrial fibrillation: Secondary | ICD-10-CM | POA: Diagnosis present

## 2022-08-24 DIAGNOSIS — N3000 Acute cystitis without hematuria: Secondary | ICD-10-CM | POA: Diagnosis not present

## 2022-08-24 DIAGNOSIS — Z8249 Family history of ischemic heart disease and other diseases of the circulatory system: Secondary | ICD-10-CM | POA: Diagnosis not present

## 2022-08-24 DIAGNOSIS — M545 Low back pain, unspecified: Secondary | ICD-10-CM | POA: Diagnosis not present

## 2022-08-24 DIAGNOSIS — D6869 Other thrombophilia: Secondary | ICD-10-CM | POA: Diagnosis present

## 2022-08-24 DIAGNOSIS — I482 Chronic atrial fibrillation, unspecified: Secondary | ICD-10-CM

## 2022-08-24 DIAGNOSIS — Z1623 Resistance to quinolones and fluoroquinolones: Secondary | ICD-10-CM | POA: Diagnosis present

## 2022-08-24 DIAGNOSIS — F419 Anxiety disorder, unspecified: Secondary | ICD-10-CM | POA: Diagnosis present

## 2022-08-24 DIAGNOSIS — F32A Depression, unspecified: Secondary | ICD-10-CM | POA: Diagnosis not present

## 2022-08-24 DIAGNOSIS — Z1611 Resistance to penicillins: Secondary | ICD-10-CM | POA: Diagnosis present

## 2022-08-24 DIAGNOSIS — Z8673 Personal history of transient ischemic attack (TIA), and cerebral infarction without residual deficits: Secondary | ICD-10-CM | POA: Diagnosis not present

## 2022-08-24 DIAGNOSIS — Z7901 Long term (current) use of anticoagulants: Secondary | ICD-10-CM | POA: Diagnosis not present

## 2022-08-24 DIAGNOSIS — Z7189 Other specified counseling: Secondary | ICD-10-CM | POA: Diagnosis not present

## 2022-08-24 DIAGNOSIS — Z515 Encounter for palliative care: Secondary | ICD-10-CM | POA: Diagnosis not present

## 2022-08-24 DIAGNOSIS — I69354 Hemiplegia and hemiparesis following cerebral infarction affecting left non-dominant side: Secondary | ICD-10-CM | POA: Diagnosis not present

## 2022-08-24 DIAGNOSIS — R627 Adult failure to thrive: Secondary | ICD-10-CM | POA: Diagnosis present

## 2022-08-24 DIAGNOSIS — E119 Type 2 diabetes mellitus without complications: Secondary | ICD-10-CM | POA: Diagnosis not present

## 2022-08-24 DIAGNOSIS — G9389 Other specified disorders of brain: Secondary | ICD-10-CM | POA: Diagnosis present

## 2022-08-24 DIAGNOSIS — E1165 Type 2 diabetes mellitus with hyperglycemia: Secondary | ICD-10-CM | POA: Diagnosis present

## 2022-08-24 DIAGNOSIS — I340 Nonrheumatic mitral (valve) insufficiency: Secondary | ICD-10-CM | POA: Diagnosis present

## 2022-08-24 LAB — COMPREHENSIVE METABOLIC PANEL
ALT: 9 U/L (ref 0–44)
AST: 17 U/L (ref 15–41)
Albumin: 2.4 g/dL — ABNORMAL LOW (ref 3.5–5.0)
Alkaline Phosphatase: 49 U/L (ref 38–126)
Anion gap: 9 (ref 5–15)
BUN: 11 mg/dL (ref 8–23)
CO2: 29 mmol/L (ref 22–32)
Calcium: 7.6 mg/dL — ABNORMAL LOW (ref 8.9–10.3)
Chloride: 99 mmol/L (ref 98–111)
Creatinine, Ser: 0.59 mg/dL (ref 0.44–1.00)
GFR, Estimated: >60 mL/min (ref >60)
Glucose, Bld: 106 mg/dL — ABNORMAL HIGH (ref 70–99)
Potassium: 2.6 mmol/L — CL (ref 3.5–5.1)
Sodium: 137 mmol/L (ref 135–145)
Total Bilirubin: 1.5 mg/dL — ABNORMAL HIGH (ref 0.3–1.2)
Total Protein: 4.9 g/dL — ABNORMAL LOW (ref 6.5–8.1)

## 2022-08-24 LAB — URINALYSIS, ROUTINE W REFLEX MICROSCOPIC
Bilirubin Urine: NEGATIVE
Glucose, UA: NEGATIVE mg/dL
Ketones, ur: NEGATIVE mg/dL
Nitrite: NEGATIVE
Protein, ur: NEGATIVE mg/dL
Specific Gravity, Urine: 1.01 (ref 1.005–1.030)
WBC, UA: >50 WBC/hpf (ref 0–5)
pH: 6 (ref 5.0–8.0)

## 2022-08-24 LAB — GLUCOSE, CAPILLARY
Glucose-Capillary: 111 mg/dL — ABNORMAL HIGH (ref 70–99)
Glucose-Capillary: 95 mg/dL (ref 70–99)
Glucose-Capillary: 108 mg/dL — ABNORMAL HIGH (ref 70–99)
Glucose-Capillary: 154 mg/dL — ABNORMAL HIGH (ref 70–99)
Glucose-Capillary: 124 mg/dL — ABNORMAL HIGH (ref 70–99)

## 2022-08-24 LAB — PHOSPHORUS
Phosphorus: 3.3 mg/dL (ref 2.5–4.6)
Phosphorus: 3 mg/dL (ref 2.5–4.6)

## 2022-08-24 LAB — MAGNESIUM: Magnesium: 1.9 mg/dL (ref 1.7–2.4)

## 2022-08-24 LAB — POTASSIUM: Potassium: 2.6 mmol/L — CL (ref 3.5–5.1)

## 2022-08-24 MED ORDER — LEVETIRACETAM IN NACL 500 MG/100ML IV SOLN
500.0000 mg | Freq: Once | INTRAVENOUS | Status: AC
  Administered 2022-08-24: 500 mg via INTRAVENOUS
  Filled 2022-08-24: qty 100

## 2022-08-24 MED ORDER — POTASSIUM CHLORIDE 10 MEQ/100ML IV SOLN
10.0000 meq | Freq: Once | INTRAVENOUS | Status: AC
  Administered 2022-08-24: 10 meq via INTRAVENOUS
  Filled 2022-08-24: qty 100

## 2022-08-24 MED ORDER — POTASSIUM CHLORIDE IN NACL 40-0.9 MEQ/L-% IV SOLN
INTRAVENOUS | Status: AC
  Filled 2022-08-24 (×2): qty 1000

## 2022-08-24 MED ORDER — VITAMIN D 25 MCG (1000 UNIT) PO TABS
1000.0000 [IU] | ORAL_TABLET | Freq: Every day | ORAL | Status: DC
  Administered 2022-08-24 (×2): 1000 [IU] via ORAL
  Filled 2022-08-24 (×4): qty 1

## 2022-08-24 MED ORDER — MELATONIN 5 MG PO TABS
2.5000 mg | ORAL_TABLET | Freq: Every day | ORAL | Status: DC
  Administered 2022-08-24 – 2022-08-25 (×2): 2.5 mg via ORAL
  Filled 2022-08-24: qty 1
  Filled 2022-08-24: qty 0.5
  Filled 2022-08-24: qty 1

## 2022-08-24 MED ORDER — POTASSIUM CHLORIDE 10 MEQ/100ML IV SOLN: 10.0000 meq | INTRAVENOUS | Status: DC

## 2022-08-24 MED ORDER — VALPROATE SODIUM 100 MG/ML IV SOLN
1000.0000 mg | Freq: Once | INTRAVENOUS | Status: AC
  Administered 2022-08-24: 1000 mg via INTRAVENOUS
  Filled 2022-08-24: qty 10

## 2022-08-24 MED ORDER — PANTOPRAZOLE SODIUM 40 MG PO TBEC
40.0000 mg | DELAYED_RELEASE_TABLET | Freq: Every day | ORAL | Status: DC
  Administered 2022-08-24 – 2022-08-25 (×2): 40 mg via ORAL
  Filled 2022-08-24 (×2): qty 1

## 2022-08-24 MED ORDER — ACETAMINOPHEN 325 MG PO TABS: 650.0000 mg | ORAL_TABLET | ORAL | Status: DC | PRN

## 2022-08-24 MED ORDER — LORAZEPAM 2 MG/ML IJ SOLN: 1.0000 mg | INTRAMUSCULAR | Status: DC | PRN

## 2022-08-24 MED ORDER — APIXABAN 2.5 MG PO TABS
2.5000 mg | ORAL_TABLET | Freq: Two times a day (BID) | ORAL | Status: DC
  Administered 2022-08-24 – 2022-08-26 (×5): 2.5 mg via ORAL
  Filled 2022-08-24 (×5): qty 1

## 2022-08-24 MED ORDER — ZINC OXIDE 40 % EX OINT
TOPICAL_OINTMENT | CUTANEOUS | Status: DC | PRN
  Filled 2022-08-24: qty 113

## 2022-08-24 MED ORDER — SODIUM CHLORIDE 0.9 % IV SOLN
1.0000 g | INTRAVENOUS | Status: DC
  Administered 2022-08-24 – 2022-08-26 (×3): 1 g via INTRAVENOUS
  Filled 2022-08-24: qty 10
  Filled 2022-08-24 (×2): qty 1

## 2022-08-24 MED ORDER — ADULT MULTIVITAMIN W/MINERALS CH
1.0000 | ORAL_TABLET | Freq: Every day | ORAL | Status: DC
  Administered 2022-08-24 – 2022-08-26 (×3): 1 via ORAL
  Filled 2022-08-24 (×3): qty 1

## 2022-08-24 MED ORDER — POTASSIUM CHLORIDE 20 MEQ PO PACK
40.0000 meq | PACK | Freq: Three times a day (TID) | ORAL | Status: DC
  Administered 2022-08-24: 40 meq via ORAL
  Filled 2022-08-24: qty 2

## 2022-08-24 MED ORDER — LEVETIRACETAM ER 500 MG PO TB24
500.0000 mg | ORAL_TABLET | Freq: Two times a day (BID) | ORAL | Status: DC
  Administered 2022-08-24 – 2022-08-26 (×4): 500 mg via ORAL
  Filled 2022-08-24 (×5): qty 1

## 2022-08-24 MED ORDER — ESTRADIOL 0.1 MG/GM VA CREA: 1.0000 | TOPICAL_CREAM | VAGINAL | Status: DC | PRN

## 2022-08-24 NOTE — Progress Notes (Signed)
PT Cancellation Note  Patient Details Name: Kara [name] MRN: [id number] DOB: Apr 14, 1923   Cancelled Treatment:    Reason Eval/Treat Not Completed: Patient not medically ready PT orders received, chart reviewed. Pt noted to have low K+ & MD recommends holding PT this date. Will f/u as able.  Aleda Grana, PT, DPT 08/24/22, 8:11 AM   Sandi Mariscal 08/24/2022, 8:11 AM

## 2022-08-24 NOTE — Progress Notes (Signed)
Patient with critical value of Potassium 2.6. This blood draw was after 4th mini-bag of Potassium was given. The 40 meq of Klor-con  had not been given at that time due to patient drowsiness.

## 2022-08-24 NOTE — Consult Note (Addendum)
Neurology Consultation  Reason for Consult: Left-sided twitching and shaking, concern for seizure, increasing left-sided weakness Referring Physician: Dr. Lurene ShadowBernard Ayiku  CC: Left-sided weakness and twitching  History is obtained from: Patient, chart, patient's family  HPI: Kara [name] is a 89 y.o. female past medical history of atrial fibrillation on apixaban, prior large right hemispheric strokes involving right MCA and fetal right P2 PCA status post revascularization but unfortunately subsequent large stroke in spite of revascularization, coronary artery disease, diabetes, frequent UTIs, hypertension, hyperlipidemia-presented to the hospital from the facility for evaluation of left-sided shaking movements and left-sided weakness.  Patient daughter reports that she has been not doing very well for the last 1 month or so and has not been able to participate in therapy as much as she used to be able to before.  She has been having more trouble using her left arm and leg.  She was able to use the rail and walk a few steps which she has not been able to do now for a few days.  The reason for bringing in yesterday was left-sided shaking and twitching involving the face and arm.  She was awake through it and required benzodiazepines and was loaded with Keppra for presumed focal seizure.  Similar activity happened multiple times since. She had 1 episode of left neck and face twitching while I was examining her as well. Patient reports residual left hemiparesis and hemianopsia from the prior stroke. Reports some back pain and frequent urination.  She was told she has a UTI but antibiotics were not started at the time. She has had poor appetite and increasing weakness on the left side now for a few weeks. Family also reports that she was started on Celexa for anxiety and depression.  She was previously on Remeron which was also continued.  Family is concerned that her weakness and somewhat intermittent  depressed level of consciousness might have coincided with Celexa initiation.  Review of notes-H&P reveals that the facility was contacted and the reported at least 3 days worth of more difficulty and worsening weakness of the left side.  In the emergency room CT head with encephalomalacia in the right parietal occipital and temporal region compatible with patient's known prior MCA/PCA distribution infarct.  Diffuse brain atrophy and chronic ischemic changes.  Urinalysis with possible UTI.   ROS: Full ROS was performed and is negative except as noted in the HPI.   Past Medical History:  Diagnosis Date   Allergy    Atrial fibrillation    CAD (coronary artery disease)    Cancer    UTERINE   Chronic anticoagulation    Chronic diarrhea    Closed compression fracture of L2 lumbar vertebra, initial encounter 11/08/2020   By CT scan done to evaluate severe persistent low back pain:  Impression below also notes spinal compression from bulging disk.     Superior endplate compression fracture of L2 with approximately 25% height loss and minimal, 2-3 mm retropulsion of the superior endplate.   Left-sided, nondisplaced zone 1 sacral insufficiency fracture.   Multilevel degenerative changes of the lumbar spine resulting    Closed intertrochanteric fracture of left femur 08/22/2020   Decubitus ulcer    sacral region   Diabetes    diet controlled   Dysuria    E. coli UTI 11/21/2014   Edema of lower extremity    mainly right foot, slightly in left foot.   Facial fracture due to fall 07/13/2020   Femur fracture, left  06/09/2019   Fibrocystic breast disease    GERD (gastroesophageal reflux disease)    Glaucoma    Glaucoma    Hematuria    Hemorrhoids    History of colon polyps    History of pancreatitis    Hospital discharge follow-up 07/22/2020   Hyperlipidemia    Hypertension    Hypokalemia    IBS (irritable bowel syndrome)    Microscopic hematuria    Mitral valve regurgitation    Orbital  fracture 07/13/2020   Osteoarthritis    fingers   Pernicious anemia    Plantar fasciitis    Recurrent UTI    Skin cancer    Sleep apnea, obstructive    Umbilical hernia without obstruction and without gangrene 06/26/2015   Vaginal atrophy    Vitamin D deficiency    Yeast vaginitis      Family History  Problem Relation Age of Onset   Coronary artery disease Father    Kidney disease Father    Cancer Sister    Diabetes Other    Cancer Mother        COLON   Bladder Cancer Neg Hx    Breast cancer Neg Hx      Social History:   reports that she has quit smoking. She has never used smokeless tobacco. She reports current alcohol use. She reports that she does not use drugs.  Medications  Current Facility-Administered Medications:    0.9 % NaCl with KCl 40 mEq / L  infusion, , Intravenous, Continuous, Lurene Shadow, MD, Last Rate: 75 mL/hr at 08/24/22 1022, New Bag at 08/24/22 1022   acetaminophen (TYLENOL) tablet 650 mg, 650 mg, Oral, Q4H PRN, Core, Doy Hutching, MD   apixaban (ELIQUIS) tablet 2.5 mg, 2.5 mg, Oral, BID, Core, Doy Hutching, MD   cefTRIAXone (ROCEPHIN) 1 g in sodium chloride 0.9 % 100 mL IVPB, 1 g, Intravenous, Q24H, Lurene Shadow, MD, Last Rate: 200 mL/hr at 08/24/22 1027, 1 g at 08/24/22 1027   cholecalciferol (VITAMIN D3) 25 MCG (1000 UNIT) tablet 1,000 Units, 1,000 Units, Oral, Q0200, Core, Doy Hutching, MD   estradiol (ESTRACE) vaginal cream 1 Applicatorful, 1 Applicatorful, Vaginal, Q72H PRN, Core, Doy Hutching, MD   insulin aspart (novoLOG) injection 0-5 Units, 0-5 Units, Subcutaneous, QHS, Core, Doy Hutching, MD   insulin aspart (novoLOG) injection 0-9 Units, 0-9 Units, Subcutaneous, TID WC, Core, Doy Hutching, MD   levETIRAcetam (KEPPRA XR) 24 hr tablet 500 mg, 500 mg, Oral, Daily, Core, Doy Hutching, MD, 500 mg at 08/24/22 1023   melatonin tablet 2.5 mg, 2.5 mg, Oral, QHS, Core, Doy Hutching, MD   multivitamin with minerals tablet 1 tablet, 1 tablet, Oral, Daily, Core, Doy Hutching, MD   Oral  care mouth rinse, 15 mL, Mouth Rinse, PRN, Core, Doy Hutching, MD   pantoprazole (PROTONIX) EC tablet 40 mg, 40 mg, Oral, QHS, Core, Doy Hutching, MD   Exam: Current vital signs: BP 134/67 (BP Location: Left Arm)   Pulse 74   Temp 97.7 F (36.5 C)   Resp 16   Ht 5\' 2"  (1.575 m)   Wt 49.7 kg   SpO2 100%   BMI 20.04 kg/m  Vital signs in last 24 hours: Temp:  [97.2 F (36.2 C)-98.6 F (37 C)] 97.7 F (36.5 C) (04/07 1219) Pulse Rate:  [67-97] 74 (04/07 1219) Resp:  [16-22] 16 (04/07 1219) BP: (124-177)/(64-98) 134/67 (04/07 1219) SpO2:  [97 %-100 %] 100 % (04/07 1219) Weight:  [49.7 kg] 49.7 kg (04/06 1956)  General: Awake alert in no distress HNT: Normocephalic atraumatic Lungs: Clear Cardiovascular: Regular rhythm Abdomen nondistended nontender Neurological exam Awake alert oriented x 3 Somewhat diminished attention concentration but able to participate in the exam without issues. No dysarthria No evidence of aphasia Cranial nerves II to XII: Pupils equal round reactive to light, extraocular movements intact, left homonymous hemianopsia, facial sensation intact, face appears grossly symmetric tongue and palate midline. Motor examination with spastic left hemiparesis with left upper extremity 4 -/5.  Left lower extremity 3/5.  Normal sensation and strength on the right side Sensation: Diminished on the left hemibody Coordination normal on the right.  Some mild ataxia but proportionate weakness on the left. Gait testing was deferred Of note, she did exhibit some neck jerking that is new per family which was intermittent. Per outpatient records, baseline modified Rankin-4  Labs I have reviewed labs in epic and the results pertinent to this consultation are: CBC    Component Value Date/Time   WBC 5.3 08/23/2022 1957   RBC 4.01 08/23/2022 1957   HGB 12.4 08/23/2022 1957   HGB 13.7 05/22/2014 1404   HCT 37.3 08/23/2022 1957   HCT 41.1 05/22/2014 1404   PLT 206 08/23/2022 1957    PLT 193 05/22/2014 1404   MCV 93.0 08/23/2022 1957   MCV 92 05/22/2014 1404   MCH 30.9 08/23/2022 1957   MCHC 33.2 08/23/2022 1957   RDW 14.9 08/23/2022 1957   RDW 13.7 05/22/2014 1404   LYMPHSABS 1.7 08/23/2022 1957   LYMPHSABS 2.6 05/22/2014 1404   MONOABS 0.4 08/23/2022 1957   MONOABS 0.4 05/22/2014 1404   EOSABS 0.1 08/23/2022 1957   EOSABS 0.1 05/22/2014 1404   BASOSABS 0.0 08/23/2022 1957   BASOSABS 0.0 05/22/2014 1404    CMP     Component Value Date/Time   NA 137 08/24/2022 0609   NA 142 05/22/2014 1404   K 2.6 (LL) 08/24/2022 0609   K 3.2 (L) 05/22/2014 1404   CL 99 08/24/2022 0609   CL 105 05/22/2014 1404   CO2 29 08/24/2022 0609   CO2 28 05/22/2014 1404   GLUCOSE 106 (H) 08/24/2022 0609   GLUCOSE 135 (H) 05/22/2014 1404   BUN 11 08/24/2022 0609   BUN 18 05/22/2014 1404   CREATININE 0.59 08/24/2022 0609   CREATININE 0.82 09/15/2014 1536   CALCIUM 7.6 (L) 08/24/2022 0609   CALCIUM 8.7 05/22/2014 1404   PROT 4.9 (L) 08/24/2022 0609   PROT 5.7 (L) 02/05/2012 0700   ALBUMIN 2.4 (L) 08/24/2022 0609   ALBUMIN 2.5 (L) 02/05/2012 0700   AST 17 08/24/2022 0609   AST 17 02/05/2012 0700   ALT 9 08/24/2022 0609   ALT 19 02/05/2012 0700   ALKPHOS 49 08/24/2022 0609   ALKPHOS 76 02/05/2012 0700   BILITOT 1.5 (H) 08/24/2022 0609   BILITOT 0.7 02/05/2012 0700   GFRNONAA >60 08/24/2022 0609   GFRNONAA 56 (L) 05/22/2014 1404   GFRNONAA >60 02/12/2012 0735   GFRAA >60 06/13/2019 0525   GFRAA >60 05/22/2014 1404   GFRAA >60 02/12/2012 0735    Lipid Panel     Component Value Date/Time   CHOL 122 05/02/2022 0619   TRIG 114 05/02/2022 0619   HDL 41 05/02/2022 0619   CHOLHDL 3.0 05/02/2022 0619   VLDL 23 05/02/2022 0619   LDLCALC 58 05/02/2022 0619   LDLDIRECT 74.0 11/18/2021 1220    Urinalysis: Moderate leukocyte Estrace, many bacteria, negative nitrite  Imaging I have reviewed the images obtained:  CT-head-sequel of the known right hemispheric infarct  with area of encephalomalacia from the known right MCA/PCA infarct.  MRI-unable to do due to noncompatible pacer    Assessment: 89 year old past history of atrial fibrillation on apixaban, prior large right hemispheric strokes involving the right MCA and PCA territory status post EVT but in spite of that developing a large stroke with imaging showing large area of encephalomalacia congruent to where the stroke was seen in December, diabetes, hypertension, hyperlipidemia, frequent UTIs presenting for evaluation of rhythmic twitching of the left face and arm which required benzodiazepines and Keppra for cessation but since then has intermittently had more of those episodes. 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 She has had worsening weakness on the left side for the past month.  I cannot definitively rule out an acute on chronic stroke-she cannot get an MRI due to an incompatible pacemaker. At this time from a stroke prevention standpoint, she is optimized on a DOAC and does not need any further stroke workup. We will manage her potential for stroke epilepsy.  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 I would increase the Keppra to 500 twice daily. I have will give her an additional Keppra dose now. I will also give her loading dose of Depakote since she has had some breakthrough episodes c/f seizures. Will hold off on adding standing doses for now. I will obtain a routine EEG tomorrow once tech is available. IV Ativan or Versed as needed for: Focal seizure lasting more than 15 minutes as long as she is still conversant and following commands Or if she has a  seizure where she is unable to follow commands, any generalized seizure activity lasting more than 5 minutes, which could be status epilepticus Seizure precautions Management of UTI and any other toxic metabolic derangements per primary team. Family concerned about lethargy with Celexa and Remeron-they are both on hold for now.  I would recommend starting one of them and not both together for they can be very sedating for a 89 year old.  Plan was discussed with the patient's family as well as RN and the day hospitalist Dr. Myriam Forehand -- Milon Dikes, MD Neurologist [facility] Pager: [phone]   SEIZURE PRECAUTIONS Per [facility] statutes, patients with seizures are not allowed to drive until they have been seizure-free for six months.   Use caution when using heavy equipment or power tools. Avoid working on ladders or at heights. Take showers instead of baths. Ensure the water temperature is not too high on the home water heater. Do not go swimming alone. Do not lock yourself in a room alone (i.e. bathroom). When caring for infants or small children, sit down when holding, feeding, or changing them to minimize risk of injury to the child in the event you have a seizure. Maintain good sleep hygiene. Avoid alcohol.    If patient has another seizure, call 911 and bring them back to the ED if: A.  The seizure lasts longer than 5 minutes.      B.  The patient doesn't wake shortly after the seizure or has new problems such as difficulty seeing, speaking or moving following the seizure C.  The patient was injured during the seizure  D.  The patient has a temperature over 102 F (39C) E.  The patient vomited during the seizure and now is having trouble breathing

## 2022-08-24 NOTE — Progress Notes (Signed)
Progress Note    Kara [name]  HQP:[id number] DOB: 1923-02-20  DOA: 08/23/2022 PCP: Sherlene Shams, MD      Brief Narrative:    Medical records reviewed and are as summarized below:  Kara [name] is a 89 y.o. female with past medical history of atrial fibrillation, CAD, DM, glaucoma, pacemaker, HLD, HTN, anemia and OSA in addition to ischemic stroke 04/2022 found with right M1/M2 junction as well as acute occlusion of the distal A2 segment of the right ACA and focal occlusion of the P2 segment of the right PCA and received TPA at that time.  Reportedly, patient had jerky movements of her face in the left side of her body.  She was given Versed by EMS prior to coming to the emergency department.  In the emergency department, patient had jerking movements of her body, suspected to be seizure, that was witnessed in by the ED physician in the emergency department. According to her daughter, patient was diagnosed with UTI with a urine test day prior to admission.  Apparently, she had declined antibiotics.  Patient was started on Celexa and mirtazapine recently, prior to admission.   She was admitted to the hospital for seizures and acute UTI.   Assessment/Plan:   Principal Problem:   Partial seizure Active Problems:   Urinary tract infection   Essential hypertension   Hypokalemia   Atrial fibrillation, chronic   Back pain   Ischemic stroke   Type 2 diabetes mellitus   Magnesium deficiency   Failure to thrive in adult   Secondary hypercoagulable state     Body mass index is 20.04 kg/m.   Seizures: Continue Keppra.  Consulted Dr. Wilford Corner, neurologist.  EEG is pending.   Acute UTI: Start IV ceftriaxone.  Urine culture has been ordered.   Hypokalemia: Replete potassium and monitor levels.  Change IV normal saline infusion to IV normal saline with 40 mEq potassium chloride   Hypomagnesemia: Improved   Permanent atrial fibrillation, history of stroke with left-sided  hemiparesis: Continue Eliquis   Other, disease include hypertension, type II DM, failure to thrive     Diet Order             Diet Carb Modified Fluid consistency: Thin; Room service appropriate? Yes  Diet effective now                            Consultants: Neurologist  Procedures: None    Medications:    apixaban  2.5 mg Oral BID   insulin aspart  0-5 Units Subcutaneous QHS   insulin aspart  0-9 Units Subcutaneous TID WC   levETIRAcetam  500 mg Oral Daily   melatonin  3 mg Oral QHS   multivitamin with minerals  1 tablet Oral Daily   pantoprazole  40 mg Oral QHS   Vitamin D  1 capsule Oral Q0200   Continuous Infusions:  0.9 % NaCl with KCl 40 mEq / L 75 mL/hr at 08/24/22 1022   cefTRIAXone (ROCEPHIN)  IV 1 g (08/24/22 1027)     Anti-infectives (From admission, onward)    Start     Dose/Rate Route Frequency Ordered Stop   08/24/22 1000  cefTRIAXone (ROCEPHIN) 1 g in sodium chloride 0.9 % 100 mL IVPB        1 g 200 mL/hr over 30 Minutes Intravenous Every 24 hours 08/24/22 0809  Family Communication/Anticipated D/C date and plan/Code Status   DVT prophylaxis: apixaban (ELIQUIS) tablet 2.5 mg Start: 08/24/22 1245 apixaban (ELIQUIS) tablet 2.5 mg     Code Status: DNR  Family Communication: Plan discussed with Kara, daughter, at the bedside Disposition Plan: Plan to discharge to SNF in 2 to 3 days   Status is: Observation The patient will require care spanning > 2 midnights and should be moved to inpatient because: Acute UTI on IV antibiotics       Subjective:   Interval events noted.  She has no complaints.  She feels better.  Kara, daughter, was at the bedside  Objective:    Vitals:   08/23/22 2300 08/24/22 0047 08/24/22 0621 08/24/22 0735  BP: (!) 152/73 (!) 167/70 (!) 177/98 135/64  Pulse: 70 69 97 67  Resp: (!) 22 19 20 16   Temp:  (!) 97.5 F (36.4 C) 98.5 F (36.9 C) (!) 97.2 F (36.2 C)   TempSrc:  Oral    SpO2: 97% 99% 100% 99%  Weight:      Height:       No data found.   Intake/Output Summary (Last 24 hours) at 08/24/2022 1219 Last data filed at 08/24/2022 0700 Gross per 24 hour  Intake 580 ml  Output 300 ml  Net 280 ml   Filed Weights   08/23/22 1956  Weight: 49.7 kg    Exam:  GEN: NAD SKIN: Warm and dry EYES: EOMI ENT: MMM CV: RRR PULM: CTA B ABD: soft, ND, NT, +BS CNS: AAO x 3, left hemiparesis EXT: No edema or tenderness         Data Reviewed:   I have personally reviewed following labs and imaging studies:  Labs: Labs show the following:   Basic Metabolic Panel: Recent Labs  Lab 08/23/22 1957 08/24/22 0108 08/24/22 0609  NA 137  --  137  K 2.6* 2.6* 2.6*  CL 99  --  99  CO2 29  --  29  GLUCOSE 173*  --  106*  BUN 12  --  11  CREATININE 0.72  --  0.59  CALCIUM 7.8*  --  7.6*  MG 1.5*  --  1.9  PHOS  --   --  3.3   GFR Estimated Creatinine Clearance: 31.5 mL/min (by C-G formula based on SCr of 0.59 mg/dL). Liver Function Tests: Recent Labs  Lab 08/23/22 1957 08/24/22 0609  AST 31 17  ALT 8 9  ALKPHOS 54 49  BILITOT 1.8* 1.5*  PROT 5.6* 4.9*  ALBUMIN 2.5* 2.4*   No results for input(s): "LIPASE", "AMYLASE" in the last 168 hours. No results for input(s): "AMMONIA" in the last 168 hours. Coagulation profile No results for input(s): "INR", "PROTIME" in the last 168 hours.  CBC: Recent Labs  Lab 08/23/22 1957  WBC 5.3  NEUTROABS 3.2  HGB 12.4  HCT 37.3  MCV 93.0  PLT 206   Cardiac Enzymes: No results for input(s): "CKTOTAL", "CKMB", "CKMBINDEX", "TROPONINI" in the last 168 hours. BNP (last 3 results) No results for input(s): "PROBNP" in the last 8760 hours. CBG: Recent Labs  Lab 08/24/22 0325 08/24/22 0737  GLUCAP 111* 95   D-Dimer: No results for input(s): "DDIMER" in the last 72 hours. Hgb A1c: No results for input(s): "HGBA1C" in the last 72 hours. Lipid Profile: No results for input(s):  "CHOL", "HDL", "LDLCALC", "TRIG", "CHOLHDL", "LDLDIRECT" in the last 72 hours. Thyroid function studies: No results for input(s): "TSH", "T4TOTAL", "T3FREE", "THYROIDAB" in the last  72 hours.  Invalid input(s): "FREET3" Anemia work up: No results for input(s): "VITAMINB12", "FOLATE", "FERRITIN", "TIBC", "IRON", "RETICCTPCT" in the last 72 hours. Sepsis Labs: Recent Labs  Lab 08/23/22 1957  WBC 5.3    Microbiology No results found for this or any previous visit (from the past 240 hour(s)).  Procedures and diagnostic studies:  DG Lumbar Spine 2-3 Views  Result Date: 08/24/2022 CLINICAL DATA:  Low back pain for 1 week, no known injury, initial encounter EXAM: LUMBAR SPINE - 2 VIEW COMPARISON:  11/15/2020 FINDINGS: There again noted changes of prior sacroplasty and L2 vertebral augmentation. Some reactive changes along the inferior aspect of L1 are noted likely chronic in nature. Mild anterolisthesis of L4 on L5 is noted of a degenerative nature. Diffuse aortic calcifications are seen without aneurysmal dilatation. IMPRESSION: Postsurgical and degenerative changes of the lumbosacral spine. No acute abnormality noted. Electronically Signed   By: Alcide Clever M.D.   On: 08/24/2022 01:33   CT Head Wo Contrast  Result Date: 08/23/2022 CLINICAL DATA:  Seizure EXAM: CT HEAD WITHOUT CONTRAST TECHNIQUE: Contiguous axial images were obtained from the base of the skull through the vertex without intravenous contrast. RADIATION DOSE REDUCTION: This exam was performed according to the departmental dose-optimization program which includes automated exposure control, adjustment of the mA and/or kV according to patient size and/or use of iterative reconstruction technique. COMPARISON:  Head CT 05/02/2022 FINDINGS: Brain: There has been interval development of encephalomalacia in the right parietal, occipital and temporal region compatible with patient's known prior MCA distribution infarct. Small old infarct in  the right cerebellum is unchanged. No evidence for acute intracranial hemorrhage, extra-axial fluid collection, acute infarct, mass effect or midline shift. Diffuse brain atrophy and chronic ischemic changes are similar to prior. No hydrocephalus. Vascular: Atherosclerotic calcifications are present within the cavernous internal carotid arteries. Skull: Normal. Negative for fracture or focal lesion. Sinuses/Orbits: No acute finding. Other: None. IMPRESSION: 1. No acute intracranial process. 2. Interval development of encephalomalacia in the right parietal, occipital and temporal region compatible with patient's known prior MCA distribution infarct. 3. Diffuse brain atrophy and chronic ischemic changes are similar to prior. Electronically Signed   By: Darliss Cheney M.D.   On: 08/23/2022 21:12               LOS: 0 days   August [name]  [facility]   Pager on [email or web address]. If 7PM-7AM, please contact night-coverage at [email or web address]     08/24/2022, 12:19 PM

## 2022-08-24 NOTE — Progress Notes (Signed)
OT Cancellation Note  Patient Details Name: Kara [name] MRN: [id number] DOB: [birth year]   Cancelled Treatment:    Reason Eval/Treat Not Completed: Medical issues which prohibited therapy;Other (comment) (K+ outside of therapy range (critical low); per MD, will hold today.)  Alvester Morin 08/24/2022, 8:11 AM

## 2022-08-24 NOTE — Plan of Care (Signed)
  Problem: Education: Goal: Ability to describe self-care measures that may prevent or decrease complications (Diabetes Survival Skills Education) will improve Outcome: Progressing Goal: Individualized Educational Video(s) Outcome: Progressing   Problem: Coping: Goal: Ability to adjust to condition or change in health will improve Outcome: Progressing   Problem: Fluid Volume: Goal: Ability to maintain a balanced intake and output will improve Outcome: Progressing   Problem: Health Behavior/Discharge Planning: Goal: Ability to identify and utilize available resources and services will improve Outcome: Progressing Goal: Ability to manage health-related needs will improve Outcome: Progressing   Problem: Metabolic: Goal: Ability to maintain appropriate glucose levels will improve Outcome: Progressing   Problem: Nutritional: Goal: Maintenance of adequate nutrition will improve Outcome: Progressing Goal: Progress toward achieving an optimal weight will improve Outcome: Progressing   Problem: Skin Integrity: Goal: Risk for impaired skin integrity will decrease Outcome: Progressing   Problem: Tissue Perfusion: Goal: Adequacy of tissue perfusion will improve Outcome: Progressing   Problem: Education: Goal: Knowledge of General Education information will improve Description: Including pain rating scale, medication(s)/side effects and non-pharmacologic comfort measures Outcome: Progressing   Problem: Health Behavior/Discharge Planning: Goal: Ability to manage health-related needs will improve Outcome: Progressing   Problem: Clinical Measurements: Goal: Ability to maintain clinical measurements within normal limits will improve Outcome: Progressing   Problem: Clinical Measurements: Goal: Ability to maintain clinical measurements within normal limits will improve Outcome: Progressing   Problem: Clinical Measurements: Goal: Ability to maintain clinical measurements within  normal limits will improve Outcome: Progressing Goal: Will remain free from infection Outcome: Progressing Goal: Diagnostic test results will improve Outcome: Progressing Goal: Respiratory complications will improve Outcome: Progressing Goal: Cardiovascular complication will be avoided Outcome: Progressing   Problem: Activity: Goal: Risk for activity intolerance will decrease Outcome: Progressing   Problem: Nutrition: Goal: Adequate nutrition will be maintained Outcome: Progressing   Problem: Coping: Goal: Level of anxiety will decrease Outcome: Progressing   Problem: Elimination: Goal: Will not experience complications related to bowel motility Outcome: Progressing Goal: Will not experience complications related to urinary retention Outcome: Progressing   Problem: Safety: Goal: Ability to remain free from injury will improve Outcome: Progressing   Problem: Skin Integrity: Goal: Risk for impaired skin integrity will decrease Outcome: Progressing

## 2022-08-25 ENCOUNTER — Inpatient Hospital Stay: Payer: Medicare Other

## 2022-08-25 DIAGNOSIS — R569 Unspecified convulsions: Secondary | ICD-10-CM | POA: Diagnosis not present

## 2022-08-25 LAB — HEMOGLOBIN A1C
Hgb A1c MFr Bld: 6.6 % — ABNORMAL HIGH (ref 4.8–5.6)
Mean Plasma Glucose: 143 mg/dL

## 2022-08-25 LAB — GLUCOSE, CAPILLARY
Glucose-Capillary: 120 mg/dL — ABNORMAL HIGH (ref 70–99)
Glucose-Capillary: 156 mg/dL — ABNORMAL HIGH (ref 70–99)
Glucose-Capillary: 121 mg/dL — ABNORMAL HIGH (ref 70–99)

## 2022-08-25 LAB — BASIC METABOLIC PANEL
Anion gap: 5 (ref 5–15)
BUN: 8 mg/dL (ref 8–23)
CO2: 29 mmol/L (ref 22–32)
Calcium: 7.5 mg/dL — ABNORMAL LOW (ref 8.9–10.3)
Chloride: 106 mmol/L (ref 98–111)
Creatinine, Ser: 0.63 mg/dL (ref 0.44–1.00)
GFR, Estimated: >60 mL/min (ref >60)
Glucose, Bld: 120 mg/dL — ABNORMAL HIGH (ref 70–99)
Potassium: 3.2 mmol/L — ABNORMAL LOW (ref 3.5–5.1)
Sodium: 140 mmol/L (ref 135–145)

## 2022-08-25 LAB — MAGNESIUM: Magnesium: 1.8 mg/dL (ref 1.7–2.4)

## 2022-08-25 MED ORDER — BANATROL TF EN LIQD: 60.0000 mL | Freq: Two times a day (BID) | ENTERAL | Status: DC

## 2022-08-25 MED ORDER — BANATROL TF EN LIQD
60.0000 mL | Freq: Two times a day (BID) | ENTERAL | Status: DC
  Administered 2022-08-25 – 2022-08-26 (×2): 60 mL via ORAL
  Filled 2022-08-25 (×3): qty 60

## 2022-08-25 MED ORDER — MAGNESIUM OXIDE -MG SUPPLEMENT 400 (240 MG) MG PO TABS
400.0000 mg | ORAL_TABLET | Freq: Two times a day (BID) | ORAL | Status: DC
  Administered 2022-08-25 – 2022-08-26 (×2): 400 mg via ORAL
  Filled 2022-08-25 (×2): qty 1

## 2022-08-25 MED ORDER — LORAZEPAM 2 MG/ML IJ SOLN: 2.0000 mg | INTRAMUSCULAR | Status: DC | PRN

## 2022-08-25 MED ORDER — CLONAZEPAM 0.5 MG PO TBDP
0.5000 mg | ORAL_TABLET | Freq: Every day | ORAL | Status: DC
  Administered 2022-08-25: 0.5 mg via ORAL
  Filled 2022-08-25: qty 1

## 2022-08-25 MED ORDER — ENSURE ENLIVE PO LIQD
237.0000 mL | Freq: Two times a day (BID) | ORAL | Status: DC
  Administered 2022-08-25 – 2022-08-26 (×2): 237 mL via ORAL

## 2022-08-25 MED ORDER — METOPROLOL TARTRATE 25 MG PO TABS
12.5000 mg | ORAL_TABLET | Freq: Two times a day (BID) | ORAL | Status: DC
  Administered 2022-08-25 – 2022-08-26 (×2): 12.5 mg via ORAL
  Filled 2022-08-25 (×2): qty 1

## 2022-08-25 MED ORDER — POTASSIUM CHLORIDE CRYS ER 20 MEQ PO TBCR
40.0000 meq | EXTENDED_RELEASE_TABLET | Freq: Once | ORAL | Status: AC
  Administered 2022-08-25: 40 meq via ORAL
  Filled 2022-08-25: qty 2

## 2022-08-25 NOTE — Procedures (Addendum)
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 EEG to evaluate for seizure.   Level of alertness: Awake, drowsy  AEDs during EEG study: LEV  Technical aspects: This EEG study was done with scalp electrodes positioned according to the 10-20 International system of electrode placement. Electrical activity was reviewed with band pass filter of 1-70Hz , sensitivity of 7 uV/mm, display speed of 25mm/sec with a 60Hz  notched filter applied as appropriate. EEG data were recorded continuously and digitally stored.  Video monitoring was available and reviewed as appropriate.  Description: The posterior dominant rhythm consists of 10 Hz activity of moderate voltage (25-35 uV) seen predominantly in posterior head regions, symmetric and reactive to eye opening and eye closing. Drowsiness was characterized by attenuation of the posterior background rhythm. EEG showed continuous 3 to 6 Hz theta-delta slowing in right hemisphere. Lateralized periodic discharges at 1 Hz were noted in right hemisphere, maximal right centro-parietal region. Hyperventilation and photic stimulation were not performed.     ABNORMALITY - Lateralized periodic discharges ( LPD ), right hemisphere, maximal right centro-parietal region - Continuous slow, right  hemisphere  IMPRESSION: This study showed evidence of epileptogenicity and cortical dysfunction arising from right hemisphere, maximal right centro-parietal region likely due to underlying encephalomalacia and increased risk of seizure recurrence. No seizures were seen throughout the recording.  Can consider long term monitoring if concern for ictal-interictal activity persists.  Carrianne Hyun Annabelle Harman

## 2022-08-25 NOTE — Progress Notes (Signed)
Eeg done 

## 2022-08-25 NOTE — Progress Notes (Signed)
Patient had 4 witnessed "seizure activities" during night. These movements were bilateral, with more movement in torso and neck. Slight movement in BUE and minimal movement in BLE. The first was ~ 2 minutes long. It occurred while patient was in a reclining position, speaking with nurse. During these movements, patient continued to speak, followed directions, and could also tell how many fingers nurse was holding up. Denied dizziness, change in hearing or vision, or any unusual sensations. The other movement episodes lasted from 30 to 90 seconds. They occurred in reclining, semi-reclining, and lying position. Continued to communicate and answer questions. Also could nod yes or no during these movements. The last episode occurred during vital signs. Respirations had been counted. The other vital signs were taken immediately after movement ended. Results were: 162/86, 101, 20, & 96% on 2LNC. No other complaints from patient. None of these met requirements for prn medication and scheduled medication was given as ordered.

## 2022-08-25 NOTE — Evaluation (Signed)
Occupational Therapy Evaluation Patient Details Name: Kara [name] MRN: [id number] DOB: 12/17/1922 Today's Date: 08/25/2022   History of Present Illness Patient is a 89 year old with left-sided twitching and shaking, concern for seizure, increasing left-sided weakness. History of residual left hemiparesis and hemianopsia from the prior stroke.   Clinical Impression   Patient presenting with decreased Ind in self care,balance, functional mobility /transfers, endurance, and safety awareness. Patient's daughter present and reports that pt with recent CVA in December with inpt rehab admission in January. Per chart review, she discharged to SNF at [facility] A wheelchair level and needing mod- max A for self care tasks. Pt's daughter reports she was standing and transferring with AD and assistance for staff but has been needing increased assistance over the last month. PTA. Patient currently needing +2 assistance for functional mobility. 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 Patient will benefit from acute OT to increase overall independence in the areas of ADLs, functional mobility, and safety awareness in order to safely discharge.     Recommendations for follow up therapy are one component of a multi-disciplinary discharge planning process, led by the attending physician.  Recommendations may be updated based on patient status, additional functional criteria and insurance authorization.   Assistance Recommended at Discharge Frequent or constant Supervision/Assistance  Patient can return home with the following Two people to help with bathing/dressing/bathroom;A lot of help with walking and/or transfers;Assistance with cooking/housework;Assist for transportation;Help with stairs or ramp for entrance    Functional Status Assessment  Patient has had a recent decline in their functional status and demonstrates the ability to make significant improvements in function in a reasonable and  predictable amount of time.  Equipment Recommendations  Other (comment) (defer)       Precautions / Restrictions Precautions Precautions: Fall Restrictions Weight Bearing Restrictions: No      Mobility Bed Mobility Overal bed mobility: Needs Assistance Bed Mobility: Supine to Sit, Sit to Supine     Supine to sit: Max assist, +2 for physical assistance Sit to supine: Max assist, +2 for physical assistance   General bed mobility comments: verbal cues for technique, sequencing. increased time and effort required with all mobility efforts    Transfers Overall transfer level: Needs assistance Equipment used: 2 person hand held assist Transfers: Sit to/from Stand Sit to Stand: Max assist, +2 physical assistance           General transfer comment: 3 bouts of standing performed from elevated surfaces. significant assistance is required to achieve standing. cues for anterior weight shifting.      Balance Overall balance assessment: Needs assistance Sitting-balance support: Feet supported Sitting balance-Leahy Scale: Poor Sitting balance - Comments: intermittent pushing to the left using RUE. verbal cues for midline.   Standing balance support: Bilateral upper extremity supported Standing balance-Leahy Scale: Zero Standing balance comment: +2 person assistance required                           ADL either performed or assessed with clinical judgement   ADL Overall ADL's : Needs assistance/impaired                                       General ADL Comments: max +2 assistance to stand from EOB. Max- total A for self care tasks.     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 residual weakness from prior stroke. limited weight acceptance with weight bearing       Communication Communication Communication: No difficulties   Cognition Arousal/Alertness: Awake/alert Behavior During Therapy: WFL for tasks assessed/performed Overall Cognitive Status: Impaired/Different from baseline Area of Impairment: Following commands, Safety/judgement                       Following Commands: Follows one step commands with increased time Safety/Judgement: Decreased awareness of safety, Decreased awareness of deficits           General Comments  patient has right gaze preference and left inattention            Home Living Family/patient expects to be discharged to:: Skilled nursing facility                                 Additional Comments: patient gets PT      Prior Functioning/Environment Prior Level of Function : Needs assist             Mobility Comments: ambulation with assistance. transfers with assistance ADLs Comments: assistance required        OT Problem List: Decreased strength;Decreased activity tolerance;Decreased safety awareness;Impaired balance (sitting and/or standing);Decreased knowledge of use of DME or AE;Decreased cognition;Decreased range of motion;Impaired UE functional use      OT Treatment/Interventions: Self-care/ADL training;Therapeutic exercise;Therapeutic activities;Energy conservation;DME and/or AE instruction;Patient/family education;Balance training;Cognitive remediation/compensation;Neuromuscular education;Visual/perceptual remediation/compensation    OT Goals(Current goals can be found in the care plan section)  Acute Rehab OT Goals Patient Stated Goal: to get stronger OT Goal Formulation: With patient/family Time For Goal Achievement: 09/08/22 Potential to Achieve Goals: Good ADL Goals Pt Will Perform Grooming: with min assist;sitting Pt Will Perform Lower Body Dressing: with mod assist;sitting/lateral leans Pt Will Transfer to Toilet: squat pivot transfer Pt Will Perform Toileting - Clothing Manipulation and hygiene: with mod assist;sitting/lateral leans  OT Frequency: Min 1X/week    Co-evaluation PT/OT/SLP Co-Evaluation/Treatment: Yes Reason for Co-Treatment: Complexity of the patient's impairments (multi-system involvement);For patient/therapist safety;To address functional/ADL transfers PT goals addressed during session: Mobility/safety with mobility OT goals addressed during session: ADL's and self-care      AM-PAC OT "6 Clicks" Daily Activity     Outcome Measure Help from another person eating meals?: A Lot Help from another person taking care of personal grooming?: A Lot Help from another person toileting, which includes using toliet, bedpan, or urinal?: Total Help from another person bathing (including washing, rinsing, drying)?: A Lot Help from another person to put on and taking off regular upper body clothing?: A Lot Help from another person to put on and taking off regular lower body clothing?: Total 6 Click Score: 10   End of Session Nurse Communication: Mobility status  Activity Tolerance: Patient limited by fatigue Patient left: in bed;with call bell/phone within reach;with bed alarm set  OT Visit Diagnosis: Unsteadiness on feet (R26.81);Repeated falls (R29.6);Muscle  weakness (generalized) (M62.81)                Time: 3212-2482 OT Time Calculation (min): 25 min Charges:  OT General Charges $OT Visit: 1 Visit OT Evaluation $OT Eval Moderate Complexity: 1 [address] [place], OTR/L , CBIS ascom [phone]  08/25/22, 4:21 PM

## 2022-08-25 NOTE — Evaluation (Signed)
Physical Therapy Evaluation Patient Details Name: Kara [name] MRN: [id number] DOB: 1922/12/23 Today's Date: 08/25/2022  History of Present Illness  Patient is a 89 year old with left-sided twitching and shaking, concern for seizure, increasing left-sided weakness. History of residual left hemiparesis and hemianopsia from the prior stroke.  Clinical Impression  Patient is agreeable to PT. Daughter at the bedside. Patient has been requiring assistance for mobility with recent decline over the past week.  The required +2 person assistance for bed mobility. She was able to stand x 3 bouts with rest break between bouts. +2 person assistance is required for standing. Poor standing balance with significant assistance required to maintain upright standing and midline posture. Right gaze preference and left side inattention noted. Poor sitting balance and patient pushing to the left intermittently. Limited activity tolerance overall. Recommend to continue PT to maximize independence and facilitate return to prior level of function.      Recommendations for follow up therapy are one component of a multi-disciplinary discharge planning process, led by the attending physician.  Recommendations may be updated based on patient status, additional functional criteria and insurance authorization.  Follow Up Recommendations Can patient physically be transported by private vehicle: No     Assistance Recommended at Discharge Frequent or constant Supervision/Assistance  Patient can return home with the following  Two people to help with walking and/or transfers;Two people to help with bathing/dressing/bathroom;Help with stairs or ramp for entrance;Assist for transportation;Assistance with cooking/housework    Equipment Recommendations  (to be determined at next level of care)  Recommendations for Other Services       Functional Status Assessment Patient has had a recent decline in their functional status and  demonstrates the ability to make significant improvements in function in a reasonable and predictable amount of time.     Precautions / Restrictions Precautions Precautions: Fall Restrictions Weight Bearing Restrictions: No      Mobility  Bed Mobility Overal bed mobility: Needs Assistance Bed Mobility: Supine to Sit, Sit to Supine     Supine to sit: Max assist, +2 for physical assistance Sit to supine: Max assist, +2 for physical assistance   General bed mobility comments: verbal cues for technique, sequencing. increased time and effort required with all mobility efforts    Transfers Overall transfer level: Needs assistance Equipment used: 2 person hand held assist Transfers: Sit to/from Stand Sit to Stand: Max assist, +2 physical assistance           General transfer comment: 3 bouts of standing performed from elevated surfaces. significant assistance is required to achieve standing. cues for anterior weight shifting.    Ambulation/Gait               General Gait Details: not attempted due to poor standing balance, limited activity tolerance  Stairs            Wheelchair Mobility    Modified Rankin (Stroke Patients Only)       Balance Overall balance assessment: Needs assistance Sitting-balance support: Feet supported Sitting balance-Leahy Scale: Poor Sitting balance - Comments: intermittent pushing to the left using RUE. verbal cues for midline.   Standing balance support: Bilateral upper extremity supported Standing balance-Leahy Scale: Zero Standing balance comment: +2 person assistance required                             Pertinent Vitals/Pain Pain Assessment Pain Assessment: No/denies pain    Home Living  Family/patient expects to be discharged to:: Skilled nursing facility                   Additional Comments: patient gets PT    Prior Function Prior Level of Function : Needs assist             Mobility  Comments: ambulation with assistance. transfers with assistance ADLs Comments: assistance required     Hand Dominance        Extremity/Trunk Assessment   Upper Extremity Assessment Upper Extremity Assessment: Defer to OT evaluation    Lower Extremity Assessment Lower Extremity Assessment: LLE deficits/detail;RLE deficits/detail RLE Deficits / Details: generalized weakness LLE Deficits / Details: limited activity tolerance for formal MMT. residual weakness from prior stroke. limited weight acceptance with weight bearing       Communication      Cognition Arousal/Alertness: Awake/alert Behavior During Therapy: WFL for tasks assessed/performed Overall Cognitive Status: Impaired/Different from baseline Area of Impairment: Following commands, Safety/judgement                       Following Commands: Follows one step commands with increased time Safety/Judgement: Decreased awareness of safety, Decreased awareness of deficits              General Comments General comments (skin integrity, edema, etc.): patient has right gaze preference and left inattention    Exercises     Assessment/Plan    PT Assessment Patient needs continued PT services  PT Problem List Decreased strength;Decreased activity tolerance;Decreased range of motion;Decreased balance;Decreased mobility;Decreased knowledge of precautions;Decreased safety awareness       PT Treatment Interventions DME instruction;Gait training;Stair training;Functional mobility training;Therapeutic activities;Therapeutic exercise;Balance training;Neuromuscular re-education;Cognitive remediation;Patient/family education    PT Goals (Current goals can be found in the Care Plan section)  Acute Rehab PT Goals Patient Stated Goal: to feel better PT Goal Formulation: With patient Time For Goal Achievement: 09/08/22 Potential to Achieve Goals: Fair    Frequency Min 2X/week     Co-evaluation                AM-PAC PT "6 Clicks" Mobility  Outcome Measure Help needed turning from your back to your side while in a flat bed without using bedrails?: A Lot Help needed moving from lying on your back to sitting on the side of a flat bed without using bedrails?: Total Help needed moving to and from a bed to a chair (including a wheelchair)?: Total Help needed standing up from a chair using your arms (e.g., wheelchair or bedside chair)?: Total Help needed to walk in hospital room?: Total Help needed climbing 3-5 steps with a railing? : Total 6 Click Score: 7    End of Session   Activity Tolerance: Patient limited by fatigue Patient left: in bed;with call bell/phone within reach;with bed alarm set;with family/visitor present Nurse Communication: Mobility status PT Visit Diagnosis: Unsteadiness on feet (R26.81);Muscle weakness (generalized) (M62.81)    Time: 4827-0786 PT Time Calculation (min) (ACUTE ONLY): 21 min   Charges:   PT Evaluation $PT Eval Low Complexity: 1 Low PT Treatments $Therapeutic Activity: 8-22 mins        Donna Bernard, PT, MPT   Ina Homes 08/25/2022, 3:52 PM

## 2022-08-25 NOTE — Progress Notes (Signed)
Initial Nutrition Assessment  DOCUMENTATION CODES:   Non-severe (moderate) malnutrition in context of social or environmental circumstances  INTERVENTION:  - Add Ensure Enlive po BID, each supplement provides 350 kcal and 20 grams of protein.  - Add Banatrol BID.   NUTRITION DIAGNOSIS:   Moderate Malnutrition related to social / environmental circumstances as evidenced by energy intake < 75% for > or equal to 3 months, percent weight loss.  GOAL:   Patient will meet greater than or equal to 90% of their needs  MONITOR:   PO intake, Supplement acceptance  REASON FOR ASSESSMENT:   Consult Assessment of nutrition requirement/status  ASSESSMENT:   89 y.o. female admits related to seizures. PMH reviewed: afib, CAD, cancer, DM, GERD. Pt is currently receiving medical management related to partial seizure.  Meds reviewed: Vit D3, sliding scale insulin, mag-ox, MVI. Labs reviewed: K low.   The pt reports that she did pretty well for breakfast this am. Her daughter at bedside reports that she was not eating well PTA at SNF because she does not like the food there. However, she likes the food a little more here at the hospital so her intakes have been better. Per record, pt has experienced a 15% wt loss in 4 months. Pt agreed to try Ensure shakes. Pt also reports that she has been experiencing diarrhea. RD will add Banatrol BID. Will continue to monitor PO intakes.   NUTRITION - FOCUSED PHYSICAL EXAM:  Appropriate for age.   Diet Order:   Diet Order             Diet Carb Modified Fluid consistency: Thin; Room service appropriate? Yes  Diet effective now                   EDUCATION NEEDS:   Not appropriate for education at this time  Skin:  Skin Assessment: Reviewed RN Assessment  Last BM:  4/8 - type 7  Height:   Ht Readings from Last 1 Encounters:  08/23/22 5\' 2"  (1.575 m)    Weight:   Wt Readings from Last 1 Encounters:  08/23/22 49.7 kg    Ideal Body  Weight:     BMI:  Body mass index is 20.04 kg/m.  Estimated Nutritional Needs:   Kcal:  1245-1490 kcals  Protein:  60-75 gm  Fluid:  1.2 L  Bethann Humble, RD, LDN, CNSC.

## 2022-08-25 NOTE — Progress Notes (Signed)
Neurology Progress Note   Subjective: -Feels her twitching movements have much improved, but is still having some minor twitching especially in the abdomen and occasionally in the arms/face -Does have some bilateral twitching episodes as well as unilateral -Of note her pacemaker is at end of battery life and was planned for change out but given it has not been firing very much decision had been made to defer surgery given the risks of surgery  Exam: Current vital signs: BP 136/78 (BP Location: Left Arm) Comment: Jo RN notified  Pulse 86   Temp 97.6 F (36.4 C) (Axillary)   Resp 20   Ht 5\' 2"  (1.575 m)   Wt 49.7 kg   SpO2 96%   BMI 20.04 kg/m  Vital signs in last 24 hours: Temp:  [97.3 F (36.3 C)-98 F (36.7 C)] 97.6 F (36.4 C) (04/08 0719) Pulse Rate:  [74-101] 86 (04/08 0719) Resp:  [18-21] 20 (04/08 0719) BP: (134-162)/(71-86) 136/78 (04/08 0753) SpO2:  [96 %-99 %] 96 % (04/08 0719)   Gen: In bed, comfortable  Resp: non-labored breathing, no grossly audible wheezing Cardiac: Perfusing extremities well  Abd: soft, nt.  Intermittent twitching of the abdomen, nonvoluntary.  Nonsuppressible.  Does seem to be slightly stronger on the left compared to the right  Neuro MS: Slightly sleepy but follows all commands, oriented to age 50I turn 8 or 8 in May") as well as current month and to current events (solar eclipse) CN: Slight right gaze preference.  Left hemianopia but can orient to stimuli on the left side.  Mild left facial droop. Motor: Left side is weaker than the right in an upper motor neuron pattern. Coordination: Ataxia slightly out of proportion to weakness in the left upper and lower extremity   Pertinent Labs:  Basic Metabolic Panel: Recent Labs  Lab 08/23/22 1957 08/24/22 0108 08/24/22 0609 08/24/22 1342 08/25/22 0401  NA 137  --  137  --  140  K 2.6* 2.6* 2.6*  --  3.2*  CL 99  --  99  --  106  CO2 29  --  29  --  29  GLUCOSE 173*  --  106*  --   120*  BUN 12  --  11  --  8  CREATININE 0.72  --  0.59  --  0.63  CALCIUM 7.8*  --  7.6*  --  7.5*  MG 1.5*  --  1.9  --  1.8  PHOS  --   --  3.3 3.0  --     CBC: Recent Labs  Lab 08/23/22 1957  WBC 5.3  NEUTROABS 3.2  HGB 12.4  HCT 37.3  MCV 93.0  PLT 206    Coagulation Studies: No results for input(s): "LABPROT", "INR" in the last 72 hours.   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  Review of labs also demonstrates hypokalemia and hypomagnesemia on admission which may have been contributing to lowered seizure threshold She has had worsening weakness on the left side for the past month.  I cannot definitively rule out an acute on chronic stroke-she cannot get an MRI due to an incompatible pacemaker. At this time from a stroke prevention standpoint, she is optimized on a DOAC and does not need any further stroke workup. We will manage her potential for stroke epilepsy.  Regarding adding additional antiseizure medications, given she is on Eliquis would avoid medications that can interact including phenobarbital, phenytoin, valproic acid.  Given her sick sinus syndrome with  pacemaker at end-of-life would prefer to avoid lacosamide.  She has not used benzodiazepines previously for anxiety and will therefore trial addition of standing clonazepam, titrating cautiously given her age  I am concerned that her abdominal twitching is also focal seizure activity, however would like to balance seizure control with side effects of medication.  Recommendations: -Continue Keppra 500 mg twice daily, currently maximized for her renal function with creatinine clearance of 30 -Add clonazepam 0.5 mg nightly (ordered) -Appreciate primary team's continued attention to electrolyte derangements and prevention of electrolyte abnormalities going forward -Neurology will follow along closely, if she is having worsening clinical seizure activity may need to consider transfer to [facility] for continuous EEG monitoring but given she has been improving over the past day here, will hold off on transfer at this time  Brooke Dare MD-PhD [facility] [phone]  [facility] coverage for [facility] is from 8 AM to 4 AM in-house and 4 PM to 8 PM by telephone/video. 8 PM to 8 AM emergent questions or overnight urgent questions should be addressed to Teleneurology On-call or [facility] neurohospitalist; contact information can be found on AMION

## 2022-08-25 NOTE — Progress Notes (Signed)
[facility] Progress Note  Patient: Kara [name]    UQJ:[id number]  DOA: 08/23/2022     Date of Service: the patient was seen and examined on 08/25/2022  Chief Complaint  Patient presents with   Seizures   Brief hospital course: Kara [name] is a 89 y.o. female with past medical history of atrial fibrillation, CAD, DM, glaucoma, pacemaker, HLD, HTN, anemia and OSA in addition to ischemic stroke 04/2022 found with right M1/M2 junction as well as acute occlusion of the distal A2 segment of the right ACA and focal occlusion of the P2 segment of the right PCA and received TPA at that time.  Reportedly, patient had jerky movements of her face in the left side of her body.  She was given Versed by EMS prior to coming to the emergency department.  In the emergency department, patient had jerking movements of her body, suspected to be seizure, that was witnessed in by the ED physician in the emergency department. According to her daughter, patient was diagnosed with UTI with a urine test day prior to admission.  Apparently, she had declined antibiotics.  Patient was started on Celexa and mirtazapine recently, prior to admission.     She was admitted to the hospital for seizures and acute UTI.   Assessment and Plan:  Seizures: Continue Keppra.   EEG:  showed evidence of epileptogenicity and cortical dysfunction arising from right hemisphere, maximal right centro-parietal region likely due to underlying encephalomalacia and increased risk of seizure recurrence. No seizures were seen throughout the recording.  Neurology following Follow EKG to check Qtc 4/8 started Klonopin 0.5 mg p.o. nightly Use Ativan IV as needed Patient may need LTM and transfer to [facility] if no improvement in the seizures     Acute UTI: Start IV ceftriaxone.  Urine culture has been ordered.     Hypokalemia: Replete potassium and monitor levels.   S/p IV NS w/kcl  Monitor electrolytes   Hypomagnesemia: Improved      Permanent atrial fibrillation, history of stroke with left-sided hemiparesis: Continue Eliquis Called pharmacy to reconcile medications as patient's daughter she is on metoprolol but I did not see that in her home medications.   Other, disease include hypertension, type II DM, failure to thrive    Body mass index is 20.04 kg/m.  Interventions:       Diet: Carb modified diet DVT Prophylaxis: Therapeutic Anticoagulation with Eliquis    Advance goals of care discussion: DNR  Family Communication: family was present at bedside, at the time of interview.  The pt provided permission to discuss medical plan with the family. Opportunity was given to ask question and all questions were answered satisfactorily.  4/8 d/w patient's daughter over the phone.  Management plan discussed.  Disposition:  Pt is from SNF, admitted with seizures, EEG done, awaiting for neurology clearance, which precludes a safe discharge. Discharge to SNF, when cleared by neurology.  Subjective: Patient had an episode of seizure-like activity last night, having some twitching abdominal bowel movements but she is awake and alert, no any seizure activities in the arms and legs.  Denies any chest pain or palpitation, no shortness of breath, no any other active issues.   Physical Exam: General: NAD, lying comfortably Appear in no distress, affect appropriate Eyes: PERRLA ENT: Oral Mucosa Clear, moist  Neck: no JVD,  Cardiovascular: S1 and S2 Present, no Murmur, regular rhythm Respiratory: good respiratory effort, Bilateral Air entry equal and Decreased, no Crackles, no wheezes Abdomen:  Bowel Sound present, Soft and no tenderness,  Skin: no rashes Extremities: no Pedal edema, no calf tenderness Neurologic: Left-sided hemiparesis, residual weakness due to prior stroke Gait not checked due to patient safety concerns  Vitals:   09-13-22 0031 09-13-2022 0550 2022-09-13 0719 2022-09-13 0753  BP: 134/71 (!) 162/86 (!)  148/72 136/78  Pulse: 87 (!) 101 86   Resp: 20 20 20    Temp: 98 F (36.7 C) 97.8 F (36.6 C) 97.6 F (36.4 C)   TempSrc:  Oral Axillary   SpO2: 97% 96% 96%   Weight:      Height:        Intake/Output Summary (Last 24 hours) at 09/13/2022 1401 Last data filed at 13-Sep-2022 0700 Gross per 24 hour  Intake 1260 ml  Output --  Net 1260 ml   Filed Weights   08/23/22 1956  Weight: 49.7 kg    Data Reviewed: I have personally reviewed and interpreted daily labs, tele strips, imagings as discussed above. I reviewed all nursing notes, pharmacy notes, vitals, pertinent old records I have discussed plan of care as described above with RN and patient/family.  CBC: Recent Labs  Lab 08/23/22 1957  WBC 5.3  NEUTROABS 3.2  HGB 12.4  HCT 37.3  MCV 93.0  PLT 206   Basic Metabolic Panel: Recent Labs  Lab 08/23/22 1957 08/24/22 0108 08/24/22 0609 08/24/22 1342 09-13-2022 0401  NA 137  --  137  --  140  K 2.6* 2.6* 2.6*  --  3.2*  CL 99  --  99  --  106  CO2 29  --  29  --  29  GLUCOSE 173*  --  106*  --  120*  BUN 12  --  11  --  8  CREATININE 0.72  --  0.59  --  0.63  CALCIUM 7.8*  --  7.6*  --  7.5*  MG 1.5*  --  1.9  --  1.8  PHOS  --   --  3.3 3.0  --     Studies: EEG adult  Result Date: 09-13-22 Kara Quest, MD     September 13, 2022 12:10 PM Patient Name: Kara [name] MRN: [id number] Epilepsy Attending: [name] Referring Physician/Provider: Milon Dikes, MD Date: September 13, 2022 Duration: 31.19 mins Patient history: 89 year old past history of atrial fibrillation on apixaban, prior large right hemispheric strokes involving the right MCA and PCA territory status post EVT but in spite of that developing a large stroke with imaging showing large area of encephalomalacia congruent to where the stroke was seen in December, diabetes, hypertension, hyperlipidemia, frequent UTIs presenting for evaluation of rhythmic twitching of the left face and arm which required benzodiazepines  and Keppra for cessation but since then has intermittently had more of those episodes. EEG to evaluate for seizure. Level of alertness: Awake, drowsy AEDs during EEG study: LEV Technical aspects: This EEG study was done with scalp electrodes positioned according to the 10-20 International system of electrode placement. Electrical activity was reviewed with band pass filter of 1-70Hz , sensitivity of 7 uV/mm, display speed of 65mm/sec with a 60Hz  notched filter applied as appropriate. EEG data were recorded continuously and digitally stored.  Video monitoring was available and reviewed as appropriate. Description: The posterior dominant rhythm consists of 10 Hz activity of moderate voltage (25-35 uV) seen predominantly in posterior head regions, symmetric and reactive to eye opening and eye closing. Drowsiness was characterized by attenuation of the posterior background rhythm. EEG showed continuous 3  to 6 Hz theta-delta slowing in right hemisphere. Lateralized periodic discharges at 1 Hz were noted in right hemisphere, maximal right centro-parietal region. Hyperventilation and photic stimulation were not performed.   ABNORMALITY - Lateralized periodic discharges ( LPD ), right hemisphere, maximal right centro-parietal region - Continuous slow, right hemisphere IMPRESSION: This study showed evidence of epileptogenicity and cortical dysfunction arising from right hemisphere, maximal right centro-parietal region likely due to underlying encephalomalacia and increased risk of seizure recurrence. No seizures were seen throughout the recording. Can consider long term monitoring if concern for ictal-interictal activity persists. Priyanka Annabelle Harman Yadav    Scheduled Meds:  apixaban  2.5 mg Oral BID   cholecalciferol  1,000 Units Oral Q0200   insulin aspart  0-5 Units Subcutaneous QHS   insulin aspart  0-9 Units Subcutaneous TID WC   levETIRAcetam  500 mg Oral BID   melatonin  2.5 mg Oral QHS   multivitamin with minerals  1  tablet Oral Daily   pantoprazole  40 mg Oral QHS   Continuous Infusions:  cefTRIAXone (ROCEPHIN)  IV 1 g (08/25/22 0946)   PRN Meds: acetaminophen, estradiol, liver oil-zinc oxide, LORazepam, mouth rinse  Time spent: 35 minutes  Author: Gillis SantaILEEP Srinidhi Landers. MD [facility] 08/25/2022 2:01 PM  To reach On-call, see care teams to locate the attending and reach out to them via [email or web address]. If 7PM-7AM, please contact night-coverage If you still have difficulty reaching the attending provider, please page the [facility] (Director on Call) for [facility] on amion for assistance.

## 2022-08-26 ENCOUNTER — Inpatient Hospital Stay (HOSPITAL_COMMUNITY): Payer: Medicare Other

## 2022-08-26 ENCOUNTER — Other Ambulatory Visit: Payer: Self-pay

## 2022-08-26 ENCOUNTER — Encounter (HOSPITAL_COMMUNITY): Payer: Self-pay

## 2022-08-26 ENCOUNTER — Inpatient Hospital Stay (HOSPITAL_COMMUNITY)
Admit: 2022-08-26 | Discharge: 2022-08-30 | DRG: 101 | Disposition: A | Discharge status: Hospice | Payer: Medicare Other | Source: Other Acute Inpatient Hospital | Attending: Internal Medicine | Admitting: Internal Medicine

## 2022-08-26 ENCOUNTER — Encounter (HOSPITAL_COMMUNITY): Payer: Self-pay | Admitting: Internal Medicine

## 2022-08-26 DIAGNOSIS — Z79899 Other long term (current) drug therapy: Secondary | ICD-10-CM

## 2022-08-26 DIAGNOSIS — Z85828 Personal history of other malignant neoplasm of skin: Secondary | ICD-10-CM

## 2022-08-26 DIAGNOSIS — I6930 Unspecified sequelae of cerebral infarction: Secondary | ICD-10-CM | POA: Diagnosis not present

## 2022-08-26 DIAGNOSIS — G9389 Other specified disorders of brain: Secondary | ICD-10-CM | POA: Diagnosis present

## 2022-08-26 DIAGNOSIS — Z515 Encounter for palliative care: Secondary | ICD-10-CM | POA: Diagnosis not present

## 2022-08-26 DIAGNOSIS — Z7189 Other specified counseling: Secondary | ICD-10-CM | POA: Diagnosis not present

## 2022-08-26 DIAGNOSIS — E119 Type 2 diabetes mellitus without complications: Secondary | ICD-10-CM | POA: Diagnosis present

## 2022-08-26 DIAGNOSIS — I69354 Hemiplegia and hemiparesis following cerebral infarction affecting left non-dominant side: Secondary | ICD-10-CM

## 2022-08-26 DIAGNOSIS — I2510 Atherosclerotic heart disease of native coronary artery without angina pectoris: Secondary | ICD-10-CM | POA: Diagnosis present

## 2022-08-26 DIAGNOSIS — E8809 Other disorders of plasma-protein metabolism, not elsewhere classified: Secondary | ICD-10-CM | POA: Diagnosis present

## 2022-08-26 DIAGNOSIS — Z888 Allergy status to other drugs, medicaments and biological substances status: Secondary | ICD-10-CM

## 2022-08-26 DIAGNOSIS — I495 Sick sinus syndrome: Secondary | ICD-10-CM | POA: Diagnosis present

## 2022-08-26 DIAGNOSIS — F32A Depression, unspecified: Secondary | ICD-10-CM | POA: Diagnosis present

## 2022-08-26 DIAGNOSIS — G40802 Other epilepsy, not intractable, without status epilepticus: Secondary | ICD-10-CM | POA: Diagnosis present

## 2022-08-26 DIAGNOSIS — I69398 Other sequelae of cerebral infarction: Secondary | ICD-10-CM

## 2022-08-26 DIAGNOSIS — Z7401 Bed confinement status: Secondary | ICD-10-CM | POA: Diagnosis not present

## 2022-08-26 DIAGNOSIS — Z950 Presence of cardiac pacemaker: Secondary | ICD-10-CM | POA: Diagnosis not present

## 2022-08-26 DIAGNOSIS — K219 Gastro-esophageal reflux disease without esophagitis: Secondary | ICD-10-CM | POA: Diagnosis present

## 2022-08-26 DIAGNOSIS — Z66 Do not resuscitate: Secondary | ICD-10-CM | POA: Diagnosis present

## 2022-08-26 DIAGNOSIS — I081 Rheumatic disorders of both mitral and tricuspid valves: Secondary | ICD-10-CM | POA: Diagnosis present

## 2022-08-26 DIAGNOSIS — D509 Iron deficiency anemia, unspecified: Secondary | ICD-10-CM | POA: Diagnosis present

## 2022-08-26 DIAGNOSIS — Z9861 Coronary angioplasty status: Secondary | ICD-10-CM

## 2022-08-26 DIAGNOSIS — Z841 Family history of disorders of kidney and ureter: Secondary | ICD-10-CM

## 2022-08-26 DIAGNOSIS — Z87891 Personal history of nicotine dependence: Secondary | ICD-10-CM

## 2022-08-26 DIAGNOSIS — G319 Degenerative disease of nervous system, unspecified: Secondary | ICD-10-CM | POA: Diagnosis present

## 2022-08-26 DIAGNOSIS — Z833 Family history of diabetes mellitus: Secondary | ICD-10-CM

## 2022-08-26 DIAGNOSIS — R569 Unspecified convulsions: Principal | ICD-10-CM

## 2022-08-26 DIAGNOSIS — N3000 Acute cystitis without hematuria: Secondary | ICD-10-CM | POA: Diagnosis present

## 2022-08-26 DIAGNOSIS — Z9049 Acquired absence of other specified parts of digestive tract: Secondary | ICD-10-CM

## 2022-08-26 DIAGNOSIS — E785 Hyperlipidemia, unspecified: Secondary | ICD-10-CM | POA: Diagnosis present

## 2022-08-26 DIAGNOSIS — E778 Other disorders of glycoprotein metabolism: Secondary | ICD-10-CM | POA: Diagnosis present

## 2022-08-26 DIAGNOSIS — B9620 Unspecified Escherichia coli [E. coli] as the cause of diseases classified elsewhere: Secondary | ICD-10-CM | POA: Diagnosis present

## 2022-08-26 DIAGNOSIS — F1090 Alcohol use, unspecified, uncomplicated: Secondary | ICD-10-CM | POA: Diagnosis present

## 2022-08-26 DIAGNOSIS — Z86010 Personal history of colonic polyps: Secondary | ICD-10-CM

## 2022-08-26 DIAGNOSIS — R627 Adult failure to thrive: Secondary | ICD-10-CM | POA: Diagnosis not present

## 2022-08-26 DIAGNOSIS — Z8673 Personal history of transient ischemic attack (TIA), and cerebral infarction without residual deficits: Secondary | ICD-10-CM | POA: Diagnosis not present

## 2022-08-26 DIAGNOSIS — I10 Essential (primary) hypertension: Secondary | ICD-10-CM | POA: Diagnosis present

## 2022-08-26 DIAGNOSIS — Z8249 Family history of ischemic heart disease and other diseases of the circulatory system: Secondary | ICD-10-CM

## 2022-08-26 DIAGNOSIS — E440 Moderate protein-calorie malnutrition: Secondary | ICD-10-CM | POA: Insufficient documentation

## 2022-08-26 DIAGNOSIS — I4821 Permanent atrial fibrillation: Secondary | ICD-10-CM | POA: Diagnosis present

## 2022-08-26 DIAGNOSIS — R339 Retention of urine, unspecified: Secondary | ICD-10-CM | POA: Diagnosis present

## 2022-08-26 DIAGNOSIS — E876 Hypokalemia: Secondary | ICD-10-CM | POA: Diagnosis present

## 2022-08-26 DIAGNOSIS — Z7901 Long term (current) use of anticoagulants: Secondary | ICD-10-CM

## 2022-08-26 LAB — GLUCOSE, CAPILLARY
Glucose-Capillary: 107 mg/dL — ABNORMAL HIGH (ref 70–99)
Glucose-Capillary: 140 mg/dL — ABNORMAL HIGH (ref 70–99)
Glucose-Capillary: 149 mg/dL — ABNORMAL HIGH (ref 70–99)
Glucose-Capillary: 173 mg/dL — ABNORMAL HIGH (ref 70–99)

## 2022-08-26 LAB — BASIC METABOLIC PANEL
Anion gap: 6 (ref 5–15)
BUN: 6 mg/dL — ABNORMAL LOW (ref 8–23)
CO2: 31 mmol/L (ref 22–32)
Calcium: 7.8 mg/dL — ABNORMAL LOW (ref 8.9–10.3)
Chloride: 104 mmol/L (ref 98–111)
Creatinine, Ser: 0.57 mg/dL (ref 0.44–1.00)
GFR, Estimated: >60 mL/min (ref >60)
Glucose, Bld: 110 mg/dL — ABNORMAL HIGH (ref 70–99)
Potassium: 3.2 mmol/L — ABNORMAL LOW (ref 3.5–5.1)
Sodium: 141 mmol/L (ref 135–145)

## 2022-08-26 LAB — URINE CULTURE: Culture: >=100000 — AB

## 2022-08-26 LAB — CBC
HCT: 35.5 % — ABNORMAL LOW (ref 36.0–46.0)
Hemoglobin: 11.4 g/dL — ABNORMAL LOW (ref 12.0–15.0)
MCH: 30.9 pg (ref 26.0–34.0)
MCHC: 32.1 g/dL (ref 30.0–36.0)
MCV: 96.2 fL (ref 80.0–100.0)
Platelets: 165 10*3/uL (ref 150–400)
RBC: 3.69 MIL/uL — ABNORMAL LOW (ref 3.87–5.11)
RDW: 15.1 % (ref 11.5–15.5)
WBC: 5.3 10*3/uL (ref 4.0–10.5)
nRBC: 0 % (ref 0.0–0.2)

## 2022-08-26 LAB — MAGNESIUM: Magnesium: 1.7 mg/dL (ref 1.7–2.4)

## 2022-08-26 LAB — PHOSPHORUS: Phosphorus: 2.6 mg/dL (ref 2.5–4.6)

## 2022-08-26 MED ORDER — CITALOPRAM HYDROBROMIDE 10 MG PO TABS
10.0000 mg | ORAL_TABLET | Freq: Every day | ORAL | Status: DC
  Filled 2022-08-26: qty 1

## 2022-08-26 MED ORDER — ACETAMINOPHEN 325 MG PO TABS: 650.0000 mg | ORAL_TABLET | Freq: Four times a day (QID) | ORAL | Status: DC | PRN

## 2022-08-26 MED ORDER — LEVETIRACETAM ER 500 MG PO TB24
500.0000 mg | ORAL_TABLET | Freq: Two times a day (BID) | ORAL | Status: DC
  Administered 2022-08-26 – 2022-08-27 (×2): 500 mg via ORAL
  Filled 2022-08-26 (×3): qty 1

## 2022-08-26 MED ORDER — SODIUM CHLORIDE 0.9 % IV SOLN: 1.0000 g | INTRAVENOUS | Status: DC

## 2022-08-26 MED ORDER — METOPROLOL TARTRATE 12.5 MG HALF TABLET
12.5000 mg | ORAL_TABLET | Freq: Two times a day (BID) | ORAL | Status: DC
  Administered 2022-08-26 – 2022-08-30 (×7): 12.5 mg via ORAL
  Filled 2022-08-26 (×8): qty 1

## 2022-08-26 MED ORDER — SODIUM CHLORIDE 0.9 % IV SOLN
1.0000 g | INTRAVENOUS | Status: DC
  Administered 2022-08-27: 1 g via INTRAVENOUS
  Filled 2022-08-26: qty 10

## 2022-08-26 MED ORDER — PANTOPRAZOLE SODIUM 40 MG PO TBEC
40.0000 mg | DELAYED_RELEASE_TABLET | Freq: Every day | ORAL | Status: DC
  Administered 2022-08-26 – 2022-08-29 (×4): 40 mg via ORAL
  Filled 2022-08-26 (×2): qty 1
  Filled 2022-08-26: qty 2
  Filled 2022-08-26: qty 1

## 2022-08-26 MED ORDER — LEVETIRACETAM ER 500 MG PO TB24: 500.0000 mg | ORAL_TABLET | Freq: Two times a day (BID) | ORAL | Status: DC

## 2022-08-26 MED ORDER — CLONAZEPAM 0.5 MG PO TBDP: 0.5000 mg | ORAL_TABLET | Freq: Every day | ORAL | 0 refills | Status: DC

## 2022-08-26 MED ORDER — APIXABAN 2.5 MG PO TABS
2.5000 mg | ORAL_TABLET | Freq: Two times a day (BID) | ORAL | Status: DC
  Administered 2022-08-26 – 2022-08-30 (×7): 2.5 mg via ORAL
  Filled 2022-08-26 (×8): qty 1

## 2022-08-26 MED ORDER — INSULIN ASPART 100 UNIT/ML IJ SOLN
0.0000 [IU] | Freq: Three times a day (TID) | INTRAMUSCULAR | Status: DC
  Administered 2022-08-27: 1 [IU] via SUBCUTANEOUS

## 2022-08-26 MED ORDER — POTASSIUM CHLORIDE CRYS ER 20 MEQ PO TBCR
40.0000 meq | EXTENDED_RELEASE_TABLET | ORAL | Status: AC
  Administered 2022-08-26 (×2): 40 meq via ORAL
  Filled 2022-08-26 (×2): qty 2

## 2022-08-26 MED ORDER — ACETAMINOPHEN 650 MG RE SUPP: 650.0000 mg | Freq: Four times a day (QID) | RECTAL | Status: DC | PRN

## 2022-08-26 MED ORDER — MELATONIN 3 MG PO TABS
3.0000 mg | ORAL_TABLET | Freq: Every evening | ORAL | Status: DC | PRN
  Administered 2022-08-29: 3 mg via ORAL
  Filled 2022-08-26: qty 1

## 2022-08-26 MED ORDER — LORAZEPAM 2 MG/ML IJ SOLN: 1.0000 mg | INTRAMUSCULAR | Status: DC | PRN

## 2022-08-26 MED ORDER — ONDANSETRON HCL 4 MG/2ML IJ SOLN: 4.0000 mg | Freq: Four times a day (QID) | INTRAMUSCULAR | Status: DC | PRN

## 2022-08-26 MED ORDER — CLONAZEPAM 0.5 MG PO TBDP
0.5000 mg | ORAL_TABLET | Freq: Every day | ORAL | Status: DC
  Administered 2022-08-26 – 2022-08-27 (×2): 0.5 mg via ORAL
  Filled 2022-08-26 (×2): qty 1

## 2022-08-26 NOTE — Progress Notes (Signed)
Neurology Progress Note   Subjective: -Has continued to have arrhythmic abdominal twitching -No other potential seizure activity noted by patient/family -Very sleepy after addition of Klonopin  Exam: Current vital signs: BP 119/64 (BP Location: Right Arm)   Pulse 73   Temp 98 F (36.7 C) (Oral)   Resp 16   Ht 5\' 2"  (1.575 m)   Wt 49.7 kg   SpO2 97%   BMI 20.04 kg/m  Vital signs in last 24 hours: Temp:  [98 F (36.7 C)-98.9 F (37.2 C)] 98 F (36.7 C) (04/09 0730) Pulse Rate:  [71-92] 73 (04/09 0730) Resp:  [16-19] 16 (04/09 0730) BP: (119-156)/(64-81) 119/64 (04/09 0748) SpO2:  [97 %-98 %] 97 % (04/09 0730)   Gen: In bed, comfortable  Resp: non-labored breathing, no grossly audible wheezing Cardiac: Perfusing extremities well  Abd: soft, nt.  Intermittent twitching of the abdomen, nonvoluntary.  Nonsuppressible.  Does seem to be slightly stronger on the left compared to the right.  Additionally she does have some intermittent slight twitching of the left upper extremity today, not seen on my exam yesterday  Neuro MS: Very sleepy but awakens briefly to follow commands, oriented to age Marland KitchenI turn 56 in May" but initially reported age as 26), reports month is April but disoriented to location (believes she is in rehab initially) CN: Left gaze preference noted today but harder to assess given her sleepiness.  Left hemianopia but can orient to stimuli on the left side.  Mild left facial droop stable from yesterday Motor: Left side is weaker than the right in an upper motor neuron pattern.  She is weaker proximally than distally in the left upper and lower extremity, particularly weak in the deltoid and hip flexor.  She is barely antigravity at the deltoid and hip flexor (3/5), but 4- to 4+ distally Coordination: Ataxia in proportion to weakness in the left upper and lower extremity   Pertinent Labs:  Basic Metabolic Panel: Recent Labs  Lab 08/23/22 1957 08/24/22 0108  08/24/22 0609 08/24/22 1342 08/25/22 0401 08/26/22 0427  NA 137  --  137  --  140 141  K 2.6* 2.6* 2.6*  --  3.2* 3.2*  CL 99  --  99  --  106 104  CO2 29  --  29  --  29 31  GLUCOSE 173*  --  106*  --  120* 110*  BUN 12  --  11  --  8 6*  CREATININE 0.72  --  0.59  --  0.63 0.57  CALCIUM 7.8*  --  7.6*  --  7.5* 7.8*  MG 1.5*  --  1.9  --  1.8 1.7  PHOS  --   --  3.3 3.0  --  2.6     CBC: Recent Labs  Lab 08/23/22 1957 08/26/22 0427  WBC 5.3 5.3  NEUTROABS 3.2  --   HGB 12.4 11.4*  HCT 37.3 35.5*  MCV 93.0 96.2  PLT 206 165     Coagulation Studies: No results for input(s): "LABPROT", "INR" in the last 72 hours.   EEG:  - Lateralized periodic discharges ( LPD ), right hemisphere, maximal right centro-parietal region - Continuous slow, right hemisphere   Impression:  89 year old past history of atrial fibrillation on apixaban, prior large right hemispheric strokes involving the right MCA and PCA territory status post EVT but in spite of that developing a large stroke with imaging showing large area of encephalomalacia congruent to where the stroke was  seen in December, diabetes, hypertension, hyperlipidemia, sick sinus syndrome with pacemaker in place, frequent UTIs presenting for evaluation of rhythmic twitching of the left face and arm which required benzodiazepines and Keppra for cessation but since then has intermittently had more of those episodes. 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  Review of labs also demonstrates hypokalemia and hypomagnesemia on admission which may have been contributing to lowered seizure threshold She has had worsening weakness on the left side for the past month.  I cannot definitively rule out an acute on chronic stroke-she cannot get an MRI due to an incompatible pacemaker. At this time from a stroke prevention standpoint, she is optimized on a DOAC and does not need  any further stroke workup. We will manage her potential for stroke epilepsy.  Regarding adding additional antiseizure medications, given she is on Eliquis would avoid medications that can interact including phenobarbital, phenytoin, valproic acid.  Given her sick sinus syndrome with pacemaker at end-of-life would prefer to avoid lacosamide.  She has not used benzodiazepines previously for anxiety and will therefore trial addition of standing clonazepam, titrating cautiously given her age  I am concerned that her abdominal twitching is also focal seizure activity though this is hard to be certain of, however need to balance seizure control with side effects of medication.  She did initially have some left upper extremity twitching for me on exam today concerning for breakthrough focal seizure activity  Recommendations: -Continue Keppra 500 mg twice daily, currently maximized for her renal function with creatinine clearance of ~30 -Continue clonazepam 0.5 mg nightly  -Appreciate primary team's continued attention to electrolyte derangements and prevention of electrolyte abnormalities going forward -Transfer to [facility] for LTM EEG, discussed with Dr. Selina Cooley by phone and Dr. Lucianne Muss via secure chat. Neurology will continue to follow in consultation.   Brooke Dare MD-PhD [facility] [phone]  [facility] coverage for [facility] is from 8 AM to 4 AM in-house and 4 PM to 8 PM by telephone/video. 8 PM to 8 AM emergent questions or overnight urgent questions should be addressed to Teleneurology On-call or [facility] neurohospitalist; contact information can be found on AMION

## 2022-08-26 NOTE — Progress Notes (Signed)
Patient with orders to transfer to [place].  [facility] arrived for transport, report called to 3W at [facility] with all questions answered.  Patient transported via stretcher with [facility], all belongings with her.

## 2022-08-26 NOTE — H&P (Signed)
History and Physical      Kara [name] XVQ:[id number] DOB: Apr 13, 1923 DOA: 08/26/2022  PCP: Sherlene Shams, MD  Patient coming from: home   I have personally briefly reviewed patient's old medical records in [facility]  Chief Complaint: seizures  HPI: Kara [name] is a 89 y.o. female with medical history significant for permanent atrial fibrillation complicated by sick sinus syndrome status post pacemaker placement, chronically anticoagulated on Eliquis, type 2 diabetes mellitus, ischemic stroke in December 2023 with residual left-sided paresis, essential pretension, depression, who is admitted to [facility] on 08/26/2022 by way of transfer from [facility] with seizures after presenting from home to [facility] ED for evaluation of left-sided facial twitching.   In the absence of any report of previous history of seizures, the patient had presented to [facility] emergency department on 08/23/2022 with tremulous activity involving the face as well as deep left upper and lower extremity.  This prompted EMS to be contacted who brought the patient to [facility] ED for further evaluation management thereof.  Via EMS, and route to the ED, she received a single dose of Versed, with report of termination of the above tremulous activity.  However, while in the emergency department, she had a similar episode of tremulous activity involving the face and left side of her body as witnessed by EDP.  She was subsequently admitted to [facility] for further evaluation management of suspected seizure like activity.  Will no report of any prior history of seizure disorder, her outpatient medication regimen was noted to have undergone recent modifications, including recent additions of Celexa as well as mirtazapine.  No recent preceding trauma reported.  She has a documented history of ischemic stroke in December 2023, reportedly in right ACA and right PCA distribution, for which she received tPA.  She has residual left-sided paresis from  this.  Relative to this new baseline, no report of any associated acute focal weakness or any acute focal numbness, paresthesias, vertigo, facial droop, dysarthria, or acute change in vision.  It was also noted that she had recent preceding diagnosis of urinary tract infection on the day prior to admission, 08/22/2022, as an outpatient.  However, she had reportedly declined antibiotic intervention at that time.  Subsequently, was started on Rocephin at [facility].  During her course at [facility], neurology was consulted and patient underwent EEG monitoring, which, per discharge summary, showed evidence of epileptogenicity and cortical dysfunction involving the right hemisphere, will noting no overt evidence of seizure activity on the EEG monitoring.  Per neurology consultation at [facility], she was started on Keppra 500 mg p.o. twice daily and also started on clonazepam 0.5 mg p.o. nightly.  No additional seizure-like activity was noted, per discharge summary, throughout the remainder of her hospital course at [facility].    Per ongoing neurology consultation, the recommendations for transfer to [place] at 2 escalate seizure monitoring to LTM EEG with video monitoring.  She subsidy transferred to [facility] for this purpose.  Most recent labs performed in [facility], current on the morning of 08/26/2022, were notable for the following: BMP notable for sodium 141, potassium 3.2, bicarbonate 31, creatinine 0.57 compared to 0.63 on 08/25/2022, glucose 110.  CBC notable for will count 5300.  Serum magnesium level 1.7, relative demonstration prior by 1.8 on 08/25/2022.     Review of Systems: As per HPI otherwise 10 point review of systems negative.   Past Medical History:  Diagnosis Date   Allergy    Atrial fibrillation  CAD (coronary artery disease)    Cancer    UTERINE   Chronic anticoagulation    Chronic diarrhea    Closed compression fracture of L2 lumbar vertebra, initial encounter 11/08/2020   By CT scan done to evaluate  severe persistent low back pain:  Impression below also notes spinal compression from bulging disk.     Superior endplate compression fracture of L2 with approximately 25% height loss and minimal, 2-3 mm retropulsion of the superior endplate.   Left-sided, nondisplaced zone 1 sacral insufficiency fracture.   Multilevel degenerative changes of the lumbar spine resulting    Closed intertrochanteric fracture of left femur 08/22/2020   Decubitus ulcer    sacral region   Diabetes    diet controlled   Dysuria    E. coli UTI 11/21/2014   Edema of lower extremity    mainly right foot, slightly in left foot.   Facial fracture due to fall 07/13/2020   Femur fracture, left 06/09/2019   Fibrocystic breast disease    GERD (gastroesophageal reflux disease)    Glaucoma    Glaucoma    Hematuria    Hemorrhoids    History of colon polyps    History of pancreatitis    Hospital discharge follow-up 07/22/2020   Hyperlipidemia    Hypertension    Hypokalemia    IBS (irritable bowel syndrome)    Microscopic hematuria    Mitral valve regurgitation    Orbital fracture 07/13/2020   Osteoarthritis    fingers   Pernicious anemia    Plantar fasciitis    Recurrent UTI    Skin cancer    Sleep apnea, obstructive    Umbilical hernia without obstruction and without gangrene 06/26/2015   Vaginal atrophy    Vitamin D deficiency    Yeast vaginitis     Past Surgical History:  Procedure Laterality Date   ABDOMINAL HYSTERECTOMY  1980's   ABDOMINAL SURGERY     for villous polyp,,,many years ago   APPENDECTOMY  1940's   ASCAD, s/p PTCA  11/28/2005   MID LESION    BREAST BIOPSY Left 1970's   CARPAL TUNNEL RELEASE Right 12/13/2015   Procedure: CARPAL TUNNEL RELEASE;  Surgeon: Juanell Fairly, MD;  Location: [facility] ORS;  Service: Orthopedics;  Laterality: Right;   COLECTOMY  2015   INTRAMEDULLARY (IM) NAIL INTERTROCHANTERIC Left 06/11/2019   Procedure: INTRAMEDULLARY (IM) NAIL INTERTROCHANTRIC;  Surgeon: Deeann Saint, MD;   Location: [facility] ORS;  Service: Orthopedics;  Laterality: Left;   IR CT HEAD LTD  05/01/2022   IR PERCUTANEOUS ART THROMBECTOMY/INFUSION INTRACRANIAL INC DIAG ANGIO  05/01/2022   IR US GUIDE VASC ACCESS RIGHT  05/01/2022   KYPHOPLASTY N/A 11/15/2020   Procedure: Nicki Reaper;  Surgeon: Kennedy Bucker, MD;  Location: [facility] ORS;  Service: Orthopedics;  Laterality: N/A;   PACEMAKER PLACEMENT     radation     for uterine cance   RADIOLOGY WITH ANESTHESIA N/A 05/01/2022   Procedure: IR WITH ANESTHESIA;  Surgeon: Radiologist, Medication, MD;  Location: [facility] OR;  Service: Radiology;  Laterality: N/A;   REFRACTIVE SURGERY     for bilateral glaumoma   SACROPLASTY N/A 11/15/2020   Procedure: SACROPLASTY;  Surgeon: Kennedy Bucker, MD;  Location: [facility] ORS;  Service: Orthopedics;  Laterality: N/A;   TONSILLECTOMY  1936    Social History:  reports that she has quit smoking. She has never used smokeless tobacco. She reports current alcohol use. She reports that she does not use drugs.   Allergies  Allergen Reactions   Baycol [Cerivastatin Sodium]    Cardizem [Diltiazem Hcl]     LOWER EXTREMITY EDEMA   Crestor [Rosuvastatin Calcium] Other (See Comments)    INTOLERANT   Cymbalta [Duloxetine Hcl] Nausea Only   Dronedarone Other (See Comments)    Fatigue   Metoprolol Tartrate Other (See Comments)   Omeprazole Other (See Comments)   Pravachol     INTOLERANT   Statins Other (See Comments)   Vioxx [Rofecoxib]    Zocor [Simvastatin]     INTOLERANT    Family History  Problem Relation Age of Onset   Coronary artery disease Father    Kidney disease Father    Cancer Sister    Diabetes Other    Cancer Mother        COLON   Bladder Cancer Neg Hx    Breast cancer Neg Hx      Prior to Admission medications   Medication Sig Start Date End Date Taking? Authorizing Provider  acetaminophen (TYLENOL) 325 MG tablet Take 2 tablets (650 mg total) by mouth every 4 (four) hours as needed for mild pain (or  temp > 37.5 C (99.5 F)). 05/21/22   Angiulli, Mcarthur Rossetti, PA-C  apixaban (ELIQUIS) 2.5 MG TABS tablet Take 1 tablet (2.5 mg total) by mouth 2 (two) times daily. 05/21/22   Angiulli, Mcarthur Rossetti, PA-C  cefTRIAXone 1 g in sodium chloride 0.9 % 100 mL Inject 1 g into the vein daily for 2 days. 08/27/22 08/29/22  Gillis Santa, MD  Cholecalciferol (VITAMIN D) 50 MCG (2000 UT) CAPS Take 1 capsule (2,000 Units total) by mouth daily at 2 am. Patient not taking: Reported on 08/23/2022 05/21/22   Angiulli, Mcarthur Rossetti, PA-C  citalopram (CELEXA) 10 MG tablet Take 10 mg by mouth daily.    [provider]  clonazePAM (KLONOPIN) 0.5 MG disintegrating tablet Take 1 tablet (0.5 mg total) by mouth at bedtime. 08/26/22   Gillis Santa, MD  estradiol (ESTRACE) 0.1 MG/GM vaginal cream Place 1 Applicatorful vaginally every 3 (three) days as needed (vaginal dryness. To be dabbed around [place]). 05/21/22   Angiulli, Mcarthur Rossetti, PA-C  levETIRAcetam (KEPPRA XR) 500 MG 24 hr tablet Take 1 tablet (500 mg total) by mouth 2 (two) times daily. 08/26/22   Gillis Santa, MD  melatonin 3 MG TABS tablet Take 1 tablet (3 mg total) by mouth at bedtime. 05/21/22   Angiulli, Mcarthur Rossetti, PA-C  metoprolol tartrate (LOPRESSOR) 25 MG tablet Take 12.5 mg by mouth 2 (two) times daily.    [provider]  mirtazapine (REMERON) 7.5 MG tablet Take 7.5 mg by mouth at bedtime.    [provider]  Multiple Vitamin (MULTIVITAMIN WITH MINERALS) TABS tablet Take 1 tablet by mouth daily. 05/21/22   Angiulli, Mcarthur Rossetti, PA-C  pantoprazole (PROTONIX) 40 MG tablet Take 1 tablet (40 mg total) by mouth at bedtime. 05/21/22   AngiulliMcarthur Rossetti, PA-C     Objective    Physical Exam: Vitals:   08/26/22 1920  BP: (!) 145/77  Pulse: 73  Resp: 16  Temp: 98.3 F (36.8 C)  TempSrc: Oral  SpO2: 97%    General: appears to be stated age; alert, oriented Skin: warm, dry, no rash Head:  AT/[place] Mouth:  Oral mucosa membranes appear moist, normal  dentition Neck: supple; trachea midline Heart: Irregular, but rate controlled; did not appreciate any M/R/G Lungs: CTAB, did not appreciate any wheezes, rales, or rhonchi Abdomen: + BS; soft, ND, NT Vascular: 2+ pedal  pulses b/l; 2+ radial pulses b/l Extremities: no peripheral edema, no muscle wasting Neuro: 3/5 Strength on left upper and lower extremity; strength on right: 5/5;  and sensation intact in upper and lower extremities b/l     Labs on Admission: I have personally reviewed following labs and imaging studies  CBC: Recent Labs  Lab 08/23/22 1957 08/26/22 0427  WBC 5.3 5.3  NEUTROABS 3.2  --   HGB 12.4 11.4*  HCT 37.3 35.5*  MCV 93.0 96.2  PLT 206 165   Basic Metabolic Panel: Recent Labs  Lab 08/23/22 1957 08/24/22 0108 08/24/22 0609 08/24/22 1342 08/25/22 0401 08/26/22 0427  NA 137  --  137  --  140 141  K 2.6* 2.6* 2.6*  --  3.2* 3.2*  CL 99  --  99  --  106 104  CO2 29  --  29  --  29 31  GLUCOSE 173*  --  106*  --  120* 110*  BUN 12  --  11  --  8 6*  CREATININE 0.72  --  0.59  --  0.63 0.57  CALCIUM 7.8*  --  7.6*  --  7.5* 7.8*  MG 1.5*  --  1.9  --  1.8 1.7  PHOS  --   --  3.3 3.0  --  2.6   GFR: Estimated Creatinine Clearance: 31.5 mL/min (by C-G formula based on SCr of 0.57 mg/dL). Liver Function Tests: Recent Labs  Lab 08/23/22 1957 08/24/22 0609  AST 31 17  ALT 8 9  ALKPHOS 54 49  BILITOT 1.8* 1.5*  PROT 5.6* 4.9*  ALBUMIN 2.5* 2.4*   No results for input(s): "LIPASE", "AMYLASE" in the last 168 hours. No results for input(s): "AMMONIA" in the last 168 hours. Coagulation Profile: No results for input(s): "INR", "PROTIME" in the last 168 hours. Cardiac Enzymes: No results for input(s): "CKTOTAL", "CKMB", "CKMBINDEX", "TROPONINI" in the last 168 hours. BNP (last 3 results) No results for input(s): "PROBNP" in the last 8760 hours. HbA1C: No results for input(s): "HGBA1C" in the last 72 hours. CBG: Recent Labs  Lab 08/25/22 1623  08/25/22 2102 08/26/22 0819 08/26/22 1148 08/26/22 1614  GLUCAP 156* 121* 107* 140* 149*   Lipid Profile: No results for input(s): "CHOL", "HDL", "LDLCALC", "TRIG", "CHOLHDL", "LDLDIRECT" in the last 72 hours. Thyroid Function Tests: No results for input(s): "TSH", "T4TOTAL", "FREET4", "T3FREE", "THYROIDAB" in the last 72 hours. Anemia Panel: No results for input(s): "VITAMINB12", "FOLATE", "FERRITIN", "TIBC", "IRON", "RETICCTPCT" in the last 72 hours. Urine analysis:    Component Value Date/Time   COLORURINE YELLOW (A) 08/24/2022 0615   APPEARANCEUR HAZY (A) 08/24/2022 0615   APPEARANCEUR Clear 05/27/2019 1002   LABSPEC 1.010 08/24/2022 0615   LABSPEC 1.016 12/26/2011 1844   PHURINE 6.0 08/24/2022 0615   GLUCOSEU NEGATIVE 08/24/2022 0615   GLUCOSEU NEGATIVE 04/02/2022 1023   HGBUR SMALL (A) 08/24/2022 0615   BILIRUBINUR NEGATIVE 08/24/2022 0615   BILIRUBINUR negative 05/21/2021 1337   BILIRUBINUR Negative 05/27/2019 1002   BILIRUBINUR Negative 12/26/2011 1844   KETONESUR NEGATIVE 08/24/2022 0615   PROTEINUR NEGATIVE 08/24/2022 0615   UROBILINOGEN 0.2 04/02/2022 1023   NITRITE NEGATIVE 08/24/2022 0615   LEUKOCYTESUR MODERATE (A) 08/24/2022 0615   LEUKOCYTESUR 1+ 12/26/2011 1844    Radiological Exams on Admission: EEG adult  Result Date: 08/25/2022 Charlsie QuestYadav, Priyanka O, MD     08/25/2022 12:10 PM Patient Name: Claud KelpSara Y Zalenski MRN: [id number] Epilepsy Attending: Charlsie QuestPriyanka O Yadav Referring Physician/Provider: Milon DikesArora, Ashish, MD  Date: 08/25/2022 Duration: 31.19 mins Patient history: 89 year old past history of atrial fibrillation on apixaban, prior large right hemispheric strokes involving the right MCA and PCA territory status post EVT but in spite of that developing a large stroke with imaging showing large area of encephalomalacia congruent to where the stroke was seen in December, diabetes, hypertension, hyperlipidemia, frequent UTIs presenting for evaluation of rhythmic twitching of the  left face and arm which required benzodiazepines and Keppra for cessation but since then has intermittently had more of those episodes. EEG to evaluate for seizure. Level of alertness: Awake, drowsy AEDs during EEG study: LEV Technical aspects: This EEG study was done with scalp electrodes positioned according to the 10-20 International system of electrode placement. Electrical activity was reviewed with band pass filter of 1-70Hz , sensitivity of 7 uV/mm, display speed of 76mm/sec with a 60Hz  notched filter applied as appropriate. EEG data were recorded continuously and digitally stored.  Video monitoring was available and reviewed as appropriate. Description: The posterior dominant rhythm consists of 10 Hz activity of moderate voltage (25-35 uV) seen predominantly in posterior head regions, symmetric and reactive to eye opening and eye closing. Drowsiness was characterized by attenuation of the posterior background rhythm. EEG showed continuous 3 to 6 Hz theta-delta slowing in right hemisphere. Lateralized periodic discharges at 1 Hz were noted in right hemisphere, maximal right centro-parietal region. Hyperventilation and photic stimulation were not performed.   ABNORMALITY - Lateralized periodic discharges ( LPD ), right hemisphere, maximal right centro-parietal region - Continuous slow, right hemisphere IMPRESSION: This study showed evidence of epileptogenicity and cortical dysfunction arising from right hemisphere, maximal right centro-parietal region likely due to underlying encephalomalacia and increased risk of seizure recurrence. No seizures were seen throughout the recording. Can consider long term monitoring if concern for ictal-interictal activity persists. Priyanka Annabelle Harman      Assessment/Plan   Principal Problem:   Seizure Active Problems:   Permanent atrial fibrillation   Acute cystitis   Essential hypertension   Hypokalemia   DM2 (diabetes mellitus, type 2)   Hypomagnesemia    Depression   GERD (gastroesophageal reflux disease)      #) Seizures: New diagnosis of seizures prompting hospitalization at [facility], and ensuing recommendation from neurology to transfer to [facility] for LTM EEG with video monitoring, which has not been ordered.  Source of seizure is suspected to be as a consequence of cephalalgia noted on updated CT head and consistent with history of ischemic CVA in December 2023, superimposed on diffuse brain atrophy in the setting of chronic ischemic changes, as further noted above.  Was started on Keppra and clonazepam at [facility], which will be continued.    Plan: Neurology consulted, and I have notified on-call neurology, Dr. Derry Lory of the patient's arrival at [facility].  Continue Keppra and clonazepam, as above.  Precautions performed conscience.  Repeat CMP, CBC in the morning.  LTM EEG with video monitoring, per neurology recommendation.  As needed Ativan for breakthrough seizures.  Every 4 hours neurochecks x 3 occurrences.           #) Acute cystitis: Outpatient diagnosis of such on 08/22/2022, with initial refusal of antibiotics, before updated urinalysis on 08/23/2022 confirmed urinary tract infection, with ensuing urine culture results noting E. coli.  Has been started on Rocephin at [facility].  No fever or cytosis to be SIRS criteria for sepsis at this time.  Plan: Continue Rocephin, as above.  Repeat CBC in the morning.             #)  Hypokalemia: Documented history of such, with most recent serum potassium level noted to be 3.2 on the morning of 08/26/2022, with discharge summary revealing ensuing supplementation via normal saline with 10 mmol/L potassium chloride as well as concomitant oral potassium chloride supplementation.  Complicated by history of hypomagnesemia, as further detailed below.  Plan: Repeat CMP in the morning.            # ) magnesium Mia: Noted in discharge summary, with most recent serum magnesium level  noted to be 1.7, relative to preceded by 1.8 on 08/25/2022.  Notable in the context of concomitant history of hypokalemia, as further detailed above.  Plan: Repeat serum magnesium level in the morning.               #) Permanent atrial fibrillation: Documented history of such. In setting of CHA2DS2-VASc score of 8, there is an indication for chronic anticoagulation for thromboembolic prophylaxis. Consistent with this, patient is chronically anticoagulated on Eliquis. Home AV nodal blocking regimen: Metoprolol tartrate.  Most recent echocardiogram was from December 2023 was notable for LVEF 60 to 65%, indeterminate diastolic parameters, mildly dilated right atrium, mild mitral crustacean and moderate to severe tricuspid regurgitation.  Chart review, this appears to be complicated by sick sinus syndrome, status post pacemaker placement.    Plan: monitor strict I's & O's and daily weights. CMP/CBC in AM.  Repeat serum mag level in the morning. Continue home AV nodal blocking regimen.  Continue home Eliquis.               #) Type 2 Diabetes Mellitus: documented history of such.  Appears to be managed via lifestyle modifications, in the absence of any outpatient insulin or oral hypoglycemic agents.  Most recent hemoglobin A1c noted to be 6.6% when checked on 08/23/2022.  Most recent blood sugar, per AM BMP was noted to be 110.    Plan: accuchecks QAC and HS with low very ose SSI.                  #) Essential Hypertension: documented h/o such, with outpatient antihypertensive regimen including metoprolol tartrate.  SBP's upon arrival at [facility] today noted to be in the 140s.  Plan: Close monitoring of subsequent BP via routine VS. continue home beta-blocker.               #) Depression: documented h/o such.  Reportedly recently started on Celexa as an outpatient.  .   Plan: Continue home Celexa.            #) GERD: documented h/o such; on  Protonix as outpatient.   Plan: continue home PPI.        DVT prophylaxis: SCD's plus home Eliquis Code Status: DNR: Active DNR form on file, and consistent with CODE STATUS documentation from most recent hospitalizations. Family Communication: none Disposition Plan: Per Rounding Team Consults called: I have notified the on-call neurologist of the patient's arrival at [facility] today, as further detailed above;  Admission status: Inpatient     I SPENT GREATER THAN 75  MINUTES IN CLINICAL CARE TIME/MEDICAL DECISION-MAKING IN COMPLETING THIS ADMISSION.      Chaney Born Biana Haggar DO [facility]  From 7PM - 7AM   08/26/2022, 8:47 PM

## 2022-08-26 NOTE — Discharge Summary (Signed)
[facility] Discharge Summary   Patient: Kara [name] CHY:[id number]  PCP: Sherlene Shams, MD  Date of admission: 08/23/2022   Date of discharge:  08/26/2022     Discharge Diagnoses:  Principal Problem:   Partial seizure Active Problems:   Urinary tract infection   Essential hypertension   Hypokalemia   Atrial fibrillation, chronic   Back pain   Ischemic stroke   Type 2 diabetes mellitus   Magnesium deficiency   Failure to thrive in adult   Secondary hypercoagulable state   Malnutrition of moderate degree   Admitted From: SNF  Disposition: Transfer to [facility] for LTM EEG as per neurology  Recommendations for Outpatient Follow-up:  PCP: in 1 wk after discharge F/u Neuro as per recommendation after discharge.   Follow up LABS/TEST:   Diet recommendation: Carb modified diet  Activity: The patient is advised to gradually reintroduce usual activities, as tolerated  Discharge Condition: stable  Code Status: DNR   History of present illness: As per the H and P dictated on admission Hospital Course:  Kara [name] is a 89 y.o. female with past medical history of atrial fibrillation, CAD, DM, glaucoma, pacemaker, HLD, HTN, anemia and OSA in addition to ischemic stroke 04/2022 found with right M1/M2 junction as well as acute occlusion of the distal A2 segment of the right ACA and focal occlusion of the P2 segment of the right PCA and received TPA at that time.  Reportedly, patient had jerky movements of her face in the left side of her body.  She was given Versed by EMS prior to coming to the emergency department.  In the emergency department, patient had jerking movements of her body, suspected to be seizure, that was witnessed in by the ED physician in the emergency department. According to her daughter, patient was diagnosed with UTI with a urine test day prior to admission.  Apparently, she had declined antibiotics.  Patient was started on Celexa and mirtazapine  recently, prior to admission. She was admitted to the hospital for seizures and acute UTI.  Assessment and Plan: # Seizures: EEG:  showed evidence of epileptogenicity and cortical dysfunction arising from right hemisphere, maximal right centro-parietal region likely due to underlying encephalomalacia and increased risk of seizure recurrence. No seizures were seen throughout the recording.  Continue Keppra 500 mg twice daily, On 4/8 started Klonopin 0.5 mg p.o. nightly Use Ativan IV as needed.  Patient was seen by neurology, and she was very sleepy after starting Klonopin last night.  Neurology recommended LTM EEG monitoring and transfer to [facility] campus.  Dr. Selina Cooley until 7 PM, and Dr. Derry Lory overnight will follow the patient so please consult neurology when patient arrives at main campus. Continue seizure precautions, fall and aspiration precautions   # Acute UTI: Start IV ceftriaxone.  Urine culture growing E. coli, resistant to ampicillin and Cipro only.  Continue antibiotic for total 5 days till 08/28/22 # Hypokalemia: Replete potassium and monitor levels.  S/p IV NS w/kcl and oral potassium. Monitor electrolytes # Hypomagnesemia: Improved # Permanent atrial fibrillation, history of stroke with left-sided hemiparesis: Continue Eliquis and Lopressor 12.5 mg p.o. twice daily  Other, disease include hypertension, type II DM, failure to thrive   Body mass index is 20.04 kg/m.  Nutrition Problem: Moderate Malnutrition Etiology: social / environmental circumstances Nutrition Interventions: Interventions: Ensure Enlive (each supplement provides 350kcal and 20 grams of protein)  On the day of the discharge the patient's vitals were  stable, and no other acute medical condition were reported by patient. the patient was felt safe to be transferred to [facility] for LTM EEG as per neurology.    Consultants: Neurology Procedures: EEG  Discharge Exam: General: Appear in no  distress, no Rash; Oral Mucosa Clear, moist. Cardiovascular: S1 and S2 Present, no Murmur, irregular rhythm Respiratory: normal respiratory effort, Bilateral Air entry present and no Crackles, no wheezes Abdomen: Bowel Sound present, Soft and no tenderness, no hernia Extremities: no Pedal edema, no calf tenderness Neurology: Left-sided residual weakness due to prior stroke, no new findings affect appropriate.  Filed Weights   08/23/22 1956  Weight: 49.7 kg   Vitals:   08/26/22 0748 08/26/22 1139  BP: 119/64 126/60  Pulse:  65  Resp:  16  Temp:  98.5 F (36.9 C)  SpO2:  95%    DISCHARGE MEDICATION: Allergies as of 08/26/2022       Reactions   Baycol [cerivastatin Sodium]    Cardizem [diltiazem Hcl]    LOWER EXTREMITY EDEMA   Crestor [rosuvastatin Calcium] Other (See Comments)   INTOLERANT   Cymbalta [duloxetine Hcl] Nausea Only   Dronedarone Other (See Comments)   Fatigue   Metoprolol Tartrate Other (See Comments)   Omeprazole Other (See Comments)   Pravachol    INTOLERANT   Statins Other (See Comments)   Vioxx [rofecoxib]    Zocor [simvastatin]    INTOLERANT        Medication List     TAKE these medications    acetaminophen 325 MG tablet Commonly known as: TYLENOL Take 2 tablets (650 mg total) by mouth every 4 (four) hours as needed for mild pain (or temp > 37.5 C (99.5 F)).   apixaban 2.5 MG Tabs tablet Commonly known as: ELIQUIS Take 1 tablet (2.5 mg total) by mouth 2 (two) times daily.   cefTRIAXone 1 g in sodium chloride 0.9 % 100 mL Inject 1 g into the vein daily for 2 days. Start taking on: August 27, 2022   citalopram 10 MG tablet Commonly known as: CELEXA Take 10 mg by mouth daily.   clonazePAM 0.5 MG disintegrating tablet Commonly known as: KLONOPIN Take 1 tablet (0.5 mg total) by mouth at bedtime.   estradiol 0.1 MG/GM vaginal cream Commonly known as: ESTRACE Place 1 Applicatorful vaginally every 3 (three) days as needed (vaginal  dryness. To be dabbed around Andor[place]urethrea).   levETIRAcetam 500 MG 24 hr tablet Commonly known as: KEPPRA XR Take 1 tablet (500 mg total) by mouth 2 (two) times daily.   melatonin 3 MG Tabs tablet Take 1 tablet (3 mg total) by mouth at bedtime.   metoprolol tartrate 25 MG tablet Commonly known as: LOPRESSOR Take 12.5 mg by mouth 2 (two) times daily.   mirtazapine 7.5 MG tablet Commonly known as: REMERON Take 7.5 mg by mouth at bedtime.   multivitamin with minerals Tabs tablet Take 1 tablet by mouth daily.   pantoprazole 40 MG tablet Commonly known as: PROTONIX Take 1 tablet (40 mg total) by mouth at bedtime.   Vitamin D 50 MCG (2000 UT) Caps Take 1 capsule (2,000 Units total) by mouth daily at 2 am.       Allergies  Allergen Reactions   Baycol [Cerivastatin Sodium]    Cardizem [Diltiazem Hcl]     LOWER EXTREMITY EDEMA   Crestor [Rosuvastatin Calcium] Other (See Comments)    INTOLERANT   Cymbalta [Duloxetine Hcl] Nausea Only   Dronedarone Other (See Comments)  Fatigue   Metoprolol Tartrate Other (See Comments)   Omeprazole Other (See Comments)   Pravachol     INTOLERANT   Statins Other (See Comments)   Vioxx [Rofecoxib]    Zocor [Simvastatin]     INTOLERANT   Discharge Instructions     Diet - low sodium heart healthy   Complete by: As directed    Discharge instructions   Complete by: As directed    Consult neurology on arrival for LTM and further management of seizure disorder   Increase activity slowly   Complete by: As directed        The results of significant diagnostics from this hospitalization (including imaging, microbiology, ancillary and laboratory) are listed below for reference.    Significant Diagnostic Studies: EEG adult  Result Date: September 12, 2022 Charlsie Quest, MD     2022/09/12 12:10 PM Patient Name: ALLYSAH GONI MRN: [id number] Epilepsy Attending: Charlsie Quest Referring Physician/Provider: Milon Dikes, MD Date: 09/12/22 Duration:  31.19 mins Patient history: 89 year old past history of atrial fibrillation on apixaban, prior large right hemispheric strokes involving the right MCA and PCA territory status post EVT but in spite of that developing a large stroke with imaging showing large area of encephalomalacia congruent to where the stroke was seen in December, diabetes, hypertension, hyperlipidemia, frequent UTIs presenting for evaluation of rhythmic twitching of the left face and arm which required benzodiazepines and Keppra for cessation but since then has intermittently had more of those episodes. EEG to evaluate for seizure. Level of alertness: Awake, drowsy AEDs during EEG study: LEV Technical aspects: This EEG study was done with scalp electrodes positioned according to the 10-20 International system of electrode placement. Electrical activity was reviewed with band pass filter of 1-70Hz , sensitivity of 7 uV/mm, display speed of 49mm/sec with a 60Hz  notched filter applied as appropriate. EEG data were recorded continuously and digitally stored.  Video monitoring was available and reviewed as appropriate. Description: The posterior dominant rhythm consists of 10 Hz activity of moderate voltage (25-35 uV) seen predominantly in posterior head regions, symmetric and reactive to eye opening and eye closing. Drowsiness was characterized by attenuation of the posterior background rhythm. EEG showed continuous 3 to 6 Hz theta-delta slowing in right hemisphere. Lateralized periodic discharges at 1 Hz were noted in right hemisphere, maximal right centro-parietal region. Hyperventilation and photic stimulation were not performed.   ABNORMALITY - Lateralized periodic discharges ( LPD ), right hemisphere, maximal right centro-parietal region - Continuous slow, right hemisphere IMPRESSION: This study showed evidence of epileptogenicity and cortical dysfunction arising from right hemisphere, maximal right centro-parietal region likely due to  underlying encephalomalacia and increased risk of seizure recurrence. No seizures were seen throughout the recording. Can consider long term monitoring if concern for ictal-interictal activity persists. Charlsie Quest   DG Lumbar Spine 2-3 Views  Result Date: 08/24/2022 CLINICAL DATA:  Low back pain for 1 week, no known injury, initial encounter EXAM: LUMBAR SPINE - 2 VIEW COMPARISON:  11/15/2020 FINDINGS: There again noted changes of prior sacroplasty and L2 vertebral augmentation. Some reactive changes along the inferior aspect of L1 are noted likely chronic in nature. Mild anterolisthesis of L4 on L5 is noted of a degenerative nature. Diffuse aortic calcifications are seen without aneurysmal dilatation. IMPRESSION: Postsurgical and degenerative changes of the lumbosacral spine. No acute abnormality noted. Electronically Signed   By: Alcide Clever M.D.   On: 08/24/2022 01:33   CT Head Wo Contrast  Result Date: 08/23/2022 CLINICAL DATA:  Seizure EXAM: CT HEAD WITHOUT CONTRAST TECHNIQUE: Contiguous axial images were obtained from the base of the skull through the vertex without intravenous contrast. RADIATION DOSE REDUCTION: This exam was performed according to the departmental dose-optimization program which includes automated exposure control, adjustment of the mA and/or kV according to patient size and/or use of iterative reconstruction technique. COMPARISON:  Head CT 05/02/2022 FINDINGS: Brain: There has been interval development of encephalomalacia in the right parietal, occipital and temporal region compatible with patient's known prior MCA distribution infarct. Small old infarct in the right cerebellum is unchanged. No evidence for acute intracranial hemorrhage, extra-axial fluid collection, acute infarct, mass effect or midline shift. Diffuse brain atrophy and chronic ischemic changes are similar to prior. No hydrocephalus. Vascular: Atherosclerotic calcifications are present within the cavernous  internal carotid arteries. Skull: Normal. Negative for fracture or focal lesion. Sinuses/Orbits: No acute finding. Other: None. IMPRESSION: 1. No acute intracranial process. 2. Interval development of encephalomalacia in the right parietal, occipital and temporal region compatible with patient's known prior MCA distribution infarct. 3. Diffuse brain atrophy and chronic ischemic changes are similar to prior. Electronically Signed   By: Darliss Cheney M.D.   On: 08/23/2022 21:12    Microbiology: Recent Results (from the past 240 hour(s))  Urine Culture (for pregnant, neutropenic or urologic patients or patients with an indwelling urinary catheter)     Status: Abnormal   Collection Time: 08/24/22  6:45 AM   Specimen: Urine, Clean Catch  Result Value Ref Range Status   Specimen Description   Final    URINE, CLEAN CATCH Performed at [facility], [address] [place], [place] [address]    Special Requests   Final    NONE Performed at [facility], [address] [place], [place] [address]    Culture >=100,000 COLONIES/mL ESCHERICHIA COLI (A)  Final   Report Status 08/26/2022 FINAL  Final   Organism ID, Bacteria ESCHERICHIA COLI (A)  Final      Susceptibility   Escherichia coli - MIC*    AMPICILLIN >=32 RESISTANT Resistant     CEFAZOLIN <=4 SENSITIVE Sensitive     CEFEPIME <=0.12 SENSITIVE Sensitive     CEFTRIAXONE <=0.25 SENSITIVE Sensitive     CIPROFLOXACIN >=4 RESISTANT Resistant     GENTAMICIN <=1 SENSITIVE Sensitive     IMIPENEM <=0.25 SENSITIVE Sensitive     NITROFURANTOIN <=16 SENSITIVE Sensitive     TRIMETH/SULFA <=20 SENSITIVE Sensitive     AMPICILLIN/SULBACTAM 4 SENSITIVE Sensitive     PIP/TAZO <=4 SENSITIVE Sensitive     * >=100,000 COLONIES/mL ESCHERICHIA COLI     Labs: CBC: Recent Labs  Lab 08/23/22 1957 08/26/22 0427  WBC 5.3 5.3  NEUTROABS 3.2  --   HGB 12.4 11.4*  HCT 37.3 35.5*  MCV 93.0 96.2  PLT 206 165   Basic Metabolic  Panel: Recent Labs  Lab 08/23/22 1957 08/24/22 0108 08/24/22 0609 08/24/22 1342 08/25/22 0401 08/26/22 0427  NA 137  --  137  --  140 141  K 2.6* 2.6* 2.6*  --  3.2* 3.2*  CL 99  --  99  --  106 104  CO2 29  --  29  --  29 31  GLUCOSE 173*  --  106*  --  120* 110*  BUN 12  --  11  --  8 6*  CREATININE 0.72  --  0.59  --  0.63 0.57  CALCIUM 7.8*  --  7.6*  --  7.5* 7.8*  MG 1.5*  --  1.9  --  1.8 1.7  PHOS  --   --  3.3 3.0  --  2.6   Liver Function Tests: Recent Labs  Lab 08/23/22 1957 08/24/22 0609  AST 31 17  ALT 8 9  ALKPHOS 54 49  BILITOT 1.8* 1.5*  PROT 5.6* 4.9*  ALBUMIN 2.5* 2.4*   No results for input(s): "LIPASE", "AMYLASE" in the last 168 hours. No results for input(s): "AMMONIA" in the last 168 hours. Cardiac Enzymes: No results for input(s): "CKTOTAL", "CKMB", "CKMBINDEX", "TROPONINI" in the last 168 hours. BNP (last 3 results) No results for input(s): "BNP" in the last 8760 hours. CBG: Recent Labs  Lab 08/25/22 0730 08/25/22 1623 08/25/22 2102 08/26/22 0819 08/26/22 1148  GLUCAP 120* 156* 121* 107* 140*    Time spent: 35 minutes  Signed:  Gillis Santa  [facility] 08/26/2022 2:15 PM

## 2022-08-26 NOTE — TOC Initial Note (Signed)
Transition of Care [facility]) - Initial/Assessment Note    Patient Details  Name: Kara [name] MRN: [id number] Date of Birth: Oct 15, 1922  [facility] CM/SW Contact:    Allena Katz, LCSW Phone Number: 08/26/2022, 2:39 PM  Clinical Narrative:  CSW spoke with patient's daughter who reports pt was in the skilled side of [facility] for rehab. Daughter and admissions coordinator report pt has 7 skilled days left and can come back for skilled if needed.  Pt also has 24/7 aide care through [employer].                      Patient Goals and CMS Choice            Expected Discharge Plan and Services         Expected Discharge Date: 08/26/22                                    Prior Living Arrangements/Services                       Activities of Daily Living Home Assistive Devices/Equipment: Hospital bed ADL Screening (condition at time of admission) Patient's cognitive ability adequate to safely complete daily activities?: No Is the patient deaf or have difficulty hearing?: No Does the patient have difficulty seeing, even when wearing glasses/contacts?: No Does the patient have difficulty concentrating, remembering, or making decisions?: Yes Patient able to express need for assistance with ADLs?: No Does the patient have difficulty dressing or bathing?: Yes Independently performs ADLs?: No Communication: Independent, Appropriate for developmental age Dressing (OT): Needs assistance Is this a change from baseline?: Pre-admission baseline Grooming: Needs assistance Is this a change from baseline?: Pre-admission baseline Feeding: Needs assistance Is this a change from baseline?: Pre-admission baseline Bathing: Needs assistance Is this a change from baseline?: Pre-admission baseline Toileting: Needs assistance Is this a change from baseline?: Pre-admission baseline In/Out Bed: Dependent Is this a change from baseline?: Pre-admission  baseline Walks in Home: Dependent Is this a change from baseline?: Pre-admission baseline Does the patient have difficulty walking or climbing stairs?: Yes Weakness of Legs: Left Weakness of Arms/Hands: Left  Permission Sought/Granted                  Emotional Assessment              Admission diagnosis:  Partial seizure [R56.9] Seizure-like activity [R56.9] Patient Active Problem List   Diagnosis Date Noted   Malnutrition of moderate degree 08/26/2022   Partial seizure 08/23/2022   Type 2 diabetes mellitus 08/23/2022   Magnesium deficiency 08/23/2022   Failure to thrive in adult 08/23/2022   Secondary hypercoagulable state 08/23/2022   Right middle cerebral artery stroke 05/05/2022   Ischemic stroke 05/01/2022   Vulvar irritation 04/22/2022   Lower abdominal pain 08/14/2021   Pain in left upper arm 08/14/2021   Dystrophic nail 08/14/2021   Urinary frequency 02/16/2021   Back pain 11/14/2020   Compression fx, lumbar spine, sequela 11/13/2020   Lumbar spinal stenosis 11/08/2020   Traumatic hemorrhage of cerebrum 07/31/2020   History of recent fall 07/30/2020   Intraparenchymal hemorrhage of brain 07/13/2020   Syncope and collapse 07/13/2020   Hypokalemia 07/13/2020   Atrial fibrillation, chronic 07/13/2020   DNR (do not resuscitate) 07/13/2020   S/P placement of cardiac pacemaker 07/03/2020   Myalgia due to statin  07/03/2020   Aortic atherosclerosis 07/03/2020   Persistent atrial fibrillation 08/12/2019   Fracture of neck of femur 06/27/2019   Displaced intertrochanteric fracture of left femur, sequela 06/25/2019   Age-related osteoporosis with current pathological fracture with routine healing 06/15/2019   Educated about COVID-19 virus infection 10/12/2018   Dysuria 02/02/2018   Edema of lower extremity 11/24/2017   Venous insufficiency of both lower extremities 11/24/2016   Essential hypertension 11/11/2016   Peripheral artery disease 11/11/2016    Chronic pulmonary hypertension 10/20/2016   Leg swelling 06/17/2016   Type 2 diabetes mellitus with diabetic neuropathy, unspecified 06/17/2016   Urinary incontinence, urge 12/27/2015   Atrophic vaginitis 09/26/2015   Severe tricuspid valve insufficiency 07/04/2015   Carpal tunnel syndrome of right wrist 05/29/2015   Chronic fatigue 05/01/2015   E. coli UTI 11/21/2014   Urethral caruncle 11/21/2014   Mitral valve regurgitation 05/09/2014   Malaise and fatigue 03/09/2014   SSS (sick sinus syndrome) 10/11/2013   CAD (coronary artery disease) 10/11/2013   Osteoarthritis 03/08/2012   Screening for breast cancer 03/08/2012   Pernicious anemia 02/04/2012   History of colectomy 02/04/2012   Vitamin D deficiency 07/30/2011   Acquired thrombophilia (HCC) 06/13/2011   Urinary tract infection 06/13/2011   Coronary atherosclerosis due to calcified coronary lesion 06/13/2011   Chronic diarrhea 06/13/2011   PCP:  Sherlene Shams, MD Pharmacy:   [facility] - [place], [place] - [address] [place] [address] Phone: [phone] Fax: [phone]  [facility] [id number] [place], [place] - [address] [place] [address] Phone: [phone] Fax: [phone]  [facility] [address] [place] [address] Phone: [phone] Fax: [phone]  [facility] - [place], [place] - [address] [place] [address] Phone: [phone] Fax: [phone]     Social Determinants of Health (SDOH) Social History: SDOH Screenings   Food Insecurity: Patient Unable To Answer (08/24/2022)  Housing: Low Risk  (03/12/2022)  Transportation Needs: No Transportation Needs (03/12/2022)  Utilities: Not At Risk (03/12/2022)  Depression (PHQ2-9): Low Risk  (04/22/2022)  Financial Resource Strain: Low Risk  (03/12/2022)  Physical Activity: Insufficiently Active (01/10/2022)  Social  Connections: Moderately Isolated (03/12/2022)  Stress: No Stress Concern Present (03/12/2022)  Tobacco Use: Medium Risk (08/24/2022)   SDOH Interventions:     Readmission Risk Interventions     No data to display

## 2022-08-27 DIAGNOSIS — Z515 Encounter for palliative care: Secondary | ICD-10-CM | POA: Diagnosis not present

## 2022-08-27 DIAGNOSIS — Z8673 Personal history of transient ischemic attack (TIA), and cerebral infarction without residual deficits: Secondary | ICD-10-CM | POA: Diagnosis not present

## 2022-08-27 DIAGNOSIS — I4821 Permanent atrial fibrillation: Secondary | ICD-10-CM | POA: Diagnosis not present

## 2022-08-27 DIAGNOSIS — Z7189 Other specified counseling: Secondary | ICD-10-CM

## 2022-08-27 DIAGNOSIS — R569 Unspecified convulsions: Secondary | ICD-10-CM | POA: Diagnosis not present

## 2022-08-27 LAB — MAGNESIUM: Magnesium: 1.7 mg/dL (ref 1.7–2.4)

## 2022-08-27 LAB — COMPREHENSIVE METABOLIC PANEL
ALT: 9 U/L (ref 0–44)
AST: 18 U/L (ref 15–41)
Albumin: 2.2 g/dL — ABNORMAL LOW (ref 3.5–5.0)
Alkaline Phosphatase: 46 U/L (ref 38–126)
Anion gap: 9 (ref 5–15)
BUN: 7 mg/dL — ABNORMAL LOW (ref 8–23)
CO2: 27 mmol/L (ref 22–32)
Calcium: 8.2 mg/dL — ABNORMAL LOW (ref 8.9–10.3)
Chloride: 103 mmol/L (ref 98–111)
Creatinine, Ser: 0.68 mg/dL (ref 0.44–1.00)
GFR, Estimated: >60 mL/min (ref >60)
Glucose, Bld: 133 mg/dL — ABNORMAL HIGH (ref 70–99)
Potassium: 3.9 mmol/L (ref 3.5–5.1)
Sodium: 139 mmol/L (ref 135–145)
Total Bilirubin: 1 mg/dL (ref 0.3–1.2)
Total Protein: 5.2 g/dL — ABNORMAL LOW (ref 6.5–8.1)

## 2022-08-27 LAB — CBC WITH DIFFERENTIAL/PLATELET
Abs Immature Granulocytes: 0.01 10*3/uL (ref 0.00–0.07)
Basophils Absolute: 0 10*3/uL (ref 0.0–0.1)
Basophils Relative: 0 %
Eosinophils Absolute: 0.2 10*3/uL (ref 0.0–0.5)
Eosinophils Relative: 3 %
HCT: 36.8 % (ref 36.0–46.0)
Hemoglobin: 12.2 g/dL (ref 12.0–15.0)
Immature Granulocytes: 0 %
Lymphocytes Relative: 45 %
Lymphs Abs: 2.5 10*3/uL (ref 0.7–4.0)
MCH: 32.1 pg (ref 26.0–34.0)
MCHC: 33.2 g/dL (ref 30.0–36.0)
MCV: 96.8 fL (ref 80.0–100.0)
Monocytes Absolute: 0.4 10*3/uL (ref 0.1–1.0)
Monocytes Relative: 7 %
Neutro Abs: 2.5 10*3/uL (ref 1.7–7.7)
Neutrophils Relative %: 45 %
Platelets: 168 10*3/uL (ref 150–400)
RBC: 3.8 MIL/uL — ABNORMAL LOW (ref 3.87–5.11)
RDW: 14.9 % (ref 11.5–15.5)
WBC: 5.5 10*3/uL (ref 4.0–10.5)
nRBC: 0 % (ref 0.0–0.2)

## 2022-08-27 LAB — GLUCOSE, CAPILLARY
Glucose-Capillary: 120 mg/dL — ABNORMAL HIGH (ref 70–99)
Glucose-Capillary: 180 mg/dL — ABNORMAL HIGH (ref 70–99)
Glucose-Capillary: 103 mg/dL — ABNORMAL HIGH (ref 70–99)
Glucose-Capillary: 216 mg/dL — ABNORMAL HIGH (ref 70–99)

## 2022-08-27 MED ORDER — MIDAZOLAM HCL 2 MG/2ML IJ SOLN: 1.0000 mg | INTRAMUSCULAR | Status: DC | PRN

## 2022-08-27 MED ORDER — OXCARBAZEPINE 150 MG PO TABS
150.0000 mg | ORAL_TABLET | Freq: Two times a day (BID) | ORAL | Status: DC
  Administered 2022-08-27 – 2022-08-30 (×6): 150 mg via ORAL
  Filled 2022-08-27 (×7): qty 1

## 2022-08-27 MED ORDER — LOPERAMIDE HCL 2 MG PO CAPS
2.0000 mg | ORAL_CAPSULE | ORAL | Status: DC | PRN
  Administered 2022-08-27 – 2022-08-30 (×3): 2 mg via ORAL
  Filled 2022-08-27 (×3): qty 1

## 2022-08-27 MED ORDER — ACETAMINOPHEN 325 MG PO TABS
650.0000 mg | ORAL_TABLET | Freq: Four times a day (QID) | ORAL | Status: DC | PRN
  Administered 2022-08-28 – 2022-08-30 (×5): 650 mg via ORAL
  Filled 2022-08-27 (×5): qty 2

## 2022-08-27 MED ORDER — MAGNESIUM SULFATE 2 GM/50ML IV SOLN
2.0000 g | Freq: Once | INTRAVENOUS | Status: AC
  Administered 2022-08-27: 2 g via INTRAVENOUS
  Filled 2022-08-27: qty 50

## 2022-08-27 NOTE — Progress Notes (Signed)
LTM EEG hooked up and running - no initial skin breakdown - push button tested - [facility] monitoring.  

## 2022-08-27 NOTE — Progress Notes (Signed)
LTM maint complete - no skin breakdown under: UG8,B1,Q9

## 2022-08-27 NOTE — TOC Progression Note (Signed)
Transition of Care [facility]) - Progression Note    Patient Details  Name: Kara [name] MRN: [id number] Date of Birth: 28-Aug-1922  [facility] CM/SW Contact  Baldemar [place], [place] Phone Number: 08/27/2022, 1:05 PM  Clinical Narrative:   Patient transfer from [facility] for LTEEG. Noting pending palliative consult. CSW to follow.    Expected Discharge Plan: Skilled Nursing Facility    Expected Discharge Plan and Services                                               Social Determinants of Health (SDOH) Interventions SDOH Screenings   Food Insecurity: No Food Insecurity (08/26/2022)  Housing: Low Risk  (08/26/2022)  Transportation Needs: No Transportation Needs (08/26/2022)  Utilities: Not At Risk (08/26/2022)  Depression (PHQ2-9): Low Risk  (04/22/2022)  Financial Resource Strain: Low Risk  (03/12/2022)  Physical Activity: Insufficiently Active (01/10/2022)  Social Connections: Moderately Isolated (03/12/2022)  Stress: No Stress Concern Present (03/12/2022)  Tobacco Use: Medium Risk (08/26/2022)    Readmission Risk Interventions     No data to display

## 2022-08-27 NOTE — Progress Notes (Signed)
Kara [name]  OHY:[id number] DOB: 04/20/1923 DOA: 08/26/2022 PCP: Sherlene Shams, MD    Brief Narrative:  89 year old with a history of permanent atrial fibrillation, SSS status post pacemaker placement, DM2, ischemic stroke December 2023 with residual left-sided paresis, HTN, and depression who was transferred to [facility] from [facility]/9/24 after she initially presented to that campus with seizure activity exhibited as left sided facial and extremity twitching.  She was initially transported to [facility] via EMS with this seizure activity, and the activity ceased when treated with Versed.  During her course at [facility] neurology was consulted, she was started on Keppra 500 mg twice daily as well as clonazepam nightly.  It was then felt that she needed to undergo long-term EEG monitoring and therefore she was transferred to [facility].  Consultants:  Neurology  Goals of Care:  Code Status: DNR   DVT prophylaxis: Eliquis  Interim Hx: Afebrile since admission.  Vital signs stable.  Oxygen saturation 90-92% on room air.  CBG well-controlled.  Is sedate but able to answer some basic questions.  I had an extended conversation with the patient's daughter at the bedside, along with Palliative Care.  Assessment & Plan:  New onset seizure activity Felt to be related to ischemic CVA December 2023 with encephalomalacia superimposed on diffuse brain atrophy in the setting of chronic ischemic change -long-term EEG monitoring per neurology - antiepileptic drug titration per neurology  Acute cystitis POA Diagnosed as an outpatient 08/22/2022 - initially refused antibiotics - Rocephin initiated during hospitalization at [facility] - d/c abx after last dose today as pt has uncomplicated UTI and has now completed 3+ days of abx tx  Hypokalemia Corrected with supplementation  Hypomagnesemia Gently supplement to goal of 2.0  Permanent atrial fibrillation CHA2DS2-VASc is 8 -on chronic Eliquis -continue  metoprolol  DM2 Does not require medical therapy in the outpatient setting -CBG well-controlled  HTN Blood pressure well-controlled at present  Depression Continue usual Celexa dose  Family Communication: Spoke with daughter at bedside Disposition: Was in SNF for rehab prior to this admission -lived independently prior to her CVA December 2023   Objective: Blood pressure (!) 160/85, pulse 89, temperature 98.4 F (36.9 C), temperature source Oral, resp. rate 18, height 5\' 2"  (1.575 m), weight 59.5 kg, SpO2 90 %. No intake or output data in the 24 hours ending 08/27/22 0844 Filed Weights   08/26/22 2052 08/27/22 0404  Weight: 59.4 kg 59.5 kg    Examination: General: No acute respiratory distress Lungs: Clear to auscultation bilaterally without wheezes or crackles Cardiovascular: Regular rate and rhythm without murmur gallop or rub normal S1 and S2 Abdomen: Nontender, nondistended, soft, bowel sounds positive, no rebound, no ascites, no appreciable mass Extremities: No significant cyanosis, clubbing, or edema bilateral lower extremities  CBC: Recent Labs  Lab 08/23/22 1957 08/26/22 0427 08/27/22 0442  WBC 5.3 5.3 5.5  NEUTROABS 3.2  --  2.5  HGB 12.4 11.4* 12.2  HCT 37.3 35.5* 36.8  MCV 93.0 96.2 96.8  PLT 206 165 168   Basic Metabolic Panel: Recent Labs  Lab 08/24/22 0609 08/24/22 1342 08/25/22 0401 08/26/22 0427 08/27/22 0442  NA 137  --  140 141 139  K 2.6*  --  3.2* 3.2* 3.9  CL 99  --  106 104 103  CO2 29  --  29 31 27   GLUCOSE 106*  --  120* 110* 133*  BUN 11  --  8 6* 7*  CREATININE 0.59  --  0.63  0.57 0.68  CALCIUM 7.6*  --  7.5* 7.8* 8.2*  MG 1.9  --  1.8 1.7 1.7  PHOS 3.3 3.0  --  2.6  --    GFR: Estimated Creatinine Clearance: 31.8 mL/min (by C-G formula based on SCr of 0.68 mg/dL).   Scheduled Meds:  apixaban  2.5 mg Oral BID   citalopram  10 mg Oral Daily   clonazePAM  0.5 mg Oral QHS   insulin aspart  0-6 Units Subcutaneous TID WC    levETIRAcetam  500 mg Oral BID   metoprolol tartrate  12.5 mg Oral BID   pantoprazole  40 mg Oral QHS   Continuous Infusions:  cefTRIAXone (ROCEPHIN) 1 g in sodium chloride 0.9 % 100 mL IVPB       LOS: 1 day   Lonia Blood, MD [facility] Office  [phone] Pager - Text Page per Loretha Stapler  If 7PM-7AM, please contact night-coverage per Amion 08/27/2022, 8:44 AM

## 2022-08-27 NOTE — Procedures (Addendum)
Patient Name: [name]  MRN: [id number]  Epilepsy Attending: Charlsie Quest  Referring Physician/Provider: Erick Blinks, MD  Duration: 08/26/2022 2350 to 08/27/2022 1634  Patient history: 89 yo F with right MCA and PCA territory stroke presented with rhythmic twitching of the left face and arm. EEG to evaluate for seizure.  Level of alertness: Awake, asleep  AEDs during EEG study: LEV  Technical aspects: This EEG study was done with scalp electrodes positioned according to the 10-20 International system of electrode placement. Electrical activity was reviewed with band pass filter of 1-70Hz , sensitivity of 7 uV/mm, display speed of 50mm/sec with a 60Hz  notched filter applied as appropriate. EEG data were recorded continuously and digitally stored.  Video monitoring was available and reviewed as appropriate.  Description: The posterior dominant rhythm consists of 8-9 Hz activity of moderate voltage (25-35 uV) seen predominantly in posterior head regions, symmetric and reactive to eye opening and eye closing. Sleep was characterized by vertex waves, sleep spindles (12 to 14 Hz), maximal frontocentral region. EEG showed continuous 3 to 6 Hz theta-delta slowing in right hemisphere. Lateralized periodic discharges were noted in right hemisphere, maximal right parietal region at 0.25 to 0.5Hz . Hyperventilation and photic stimulation were not performed.     ABNORMALITY - Lateralized periodic discharges, right hemisphere, maximal right parietal region  - Continuous slow, right hemisphere  IMPRESSION: This study showed evidence of epileptogenicity and cortical dysfunction arising from right hemisphere, maximal right parietal region likely due to underlying stroke and with increased risk of seizure recurrence. No seizures  were seen throughout the recording.  Tyberius Ryner Annabelle Harman

## 2022-08-27 NOTE — Consult Note (Signed)
Palliative Care Consult Note                                  Date: 08/27/2022   Patient Name: Kara [name]  DOB: Oct 03, 1922  MRN: [id number]  Age / Sex: 89 y.o., female  PCP: Sherlene Shams, MD Referring Physician: Lonia Blood, MD  Reason for Consultation: Establishing goals of care  HPI/Patient Profile: 89 y.o. female  with past medical history of atrial fibrillation, sick sinus syndrome s/p pacemaker placement, type 2 diabetes mellitus, hypertension, depression, and CVA in December 2023 with residual left-sided weakness. She presented to [facility] on 08/23/22 with possible seizure activity involving the face as well as left upper and lower extremities.  Transferred to [facility] on 08/26/2022 for continuous EEG.   Palliative Medicine has been consulted for goals of care.  Past Medical History:  Diagnosis Date   Allergy    Atrial fibrillation    CAD (coronary artery disease)    Cancer    UTERINE   Chronic anticoagulation    Chronic diarrhea    Closed intertrochanteric fracture of left femur 08/22/2020   Decubitus ulcer    sacral region   Diabetes    Facial fracture due to fall 07/13/2020   Femur fracture, left 06/09/2019   Fibrocystic breast disease    GERD (gastroesophageal reflux disease)    Glaucoma    History of pancreatitis    Hyperlipidemia    Hypertension    Hypokalemia    IBS (irritable bowel syndrome)    Microscopic hematuria    Mitral valve regurgitation    Osteoarthritis    fingers   Pernicious anemia    Plantar fasciitis    Recurrent UTI    Skin cancer    Sleep apnea, obstructive    Umbilical hernia without obstruction and without gangrene 06/26/2015    Subjective:   I have reviewed medical records including progress notes, labs and imaging, and assessed the patient at bedside.  She is awake and able to answer some simple questions.  Her main complaint is the Flexi-Seal that was placed earlier today.  I met with  her daughter/[name] at bedside to discuss diagnosis, prognosis, GOC, disposition, and options.  Patient has recently been followed by outpatient palliative service ([facility]). I re-introduced Palliative Medicine as specialized medical care for people living with serious illness. It focuses on providing relief from the symptoms and stress of a serious illness.   A brief life review was discussed.  Huntley Dec is originally from [place], [place].  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  Serina Cowper is her only child.  She is widowed. Her hobbies were playing Bridge and reading her Bible. She also loves dogs.   Huntley Dec has lived in the [place] community for 20 years. Prior to the stroke in December 2023, she was ambulatory with a rollator and independent with her ADLs. Her caregiver/Lena would come one day per week to help with grocery shopping.   After the stroke, Huntley Dec went to [facility] inpatient rehab ([facility]IR). According to [facility], she was doing well with therapy and able to ambulate with assistance. From CIR, she went to the SNF at [facility]. [name] reports there has been a significant decline over the past 5 weeks.    We discussed Nimra's current medical situation and what it means in the larger context of her ongoing co-morbidities. Discussed the natural disease  trajectory of a sudden, severe neurological injury such as stroke, emphasizing that most patients do not return to their previous baseline after such a major event. Discussed that additional complications make it more difficult   Values and goals of care important to patient and family were attempted to be elicited. At this time Serina Cowper would like to continue current medical interventions and diagnostic work-up to determine if there are any reversible causes for patient's decline.     Regarding disposition, Serina Cowper reports the plan is return to her apartment at [facility] with 24/7 private care. She is considering continued physical therapy  versus the addition of hospice support. Brief discussion was had about the philosophy and benefits of hospice care.   Questions and concerns addressed. Patient/family encouraged to call with questions or concerns.     Review of Systems  Unable to perform ROS   Objective:   Primary Diagnoses: Present on Admission:  Acute cystitis  Hypokalemia  Hypomagnesemia  Permanent atrial fibrillation  Essential hypertension  Depression  GERD (gastroesophageal reflux disease)   Physical Exam Vitals reviewed.  Constitutional:      General: She is awake. She is not in acute distress.    Appearance: She is ill-appearing.  Pulmonary:     Effort: Pulmonary effort is normal.  Neurological:     Mental Status: She is confused.     Motor: Weakness present.    Vital Signs:  BP 137/80 (BP Location: Right Arm)   Pulse 76   Temp 97.6 F (36.4 C) (Axillary)   Resp 18   Ht 5\' 2"  (1.575 m)   Wt 59.5 kg   SpO2 93%   BMI 23.99 kg/m   Palliative Assessment/Data: PPS 30%     Assessment & Plan:   SUMMARY OF RECOMMENDATIONS   DNR/DNI as previously documented Continue current interventions and diagnostic work-up Ongoing palliative support  Primary Decision Maker: NEXT OF KIN - daughter/[name]  Prognosis:  Unable to determine    Discussed with: Dr. Sharon Seller   Thank you for allowing [place] to participate in the care of Claud Kelp  MDM - High   Signed by: Sherlean Foot, NP Palliative Medicine Team  Team Phone # [phone]  For individual providers, please see AMION

## 2022-08-27 NOTE — Progress Notes (Signed)
LTM EEG discontinued - no skin breakdown at unhook. [facility] notified 

## 2022-08-27 NOTE — Progress Notes (Signed)
Subjective: No seizures overnight. Patient drowsy. Daughter and caretaker at bedside.  ROS: Unable to obtain due to poor mental status  Examination  Vital signs in last 24 hours: Temp:  [97.6 F (36.4 C)-98.5 F (36.9 C)] 97.9 F (36.6 C) (04/10 1613) Pulse Rate:  [61-89] 80 (04/10 1613) Resp:  [16-20] 18 (04/10 1613) BP: (120-160)/(53-85) 120/57 (04/10 1613) SpO2:  [90 %-97 %] 90 % (04/10 1613) Weight:  [59.4 kg-59.5 kg] 59.5 kg (04/10 0404)  General: lying in bed, NAD Neuro: drowsy but able to open eyes and answer all questions appropriately, no aphasia, oriented to time place and person, left hemianopia vs neglect ( unable to examinee as patient drowsy and only open eyes briefly, PERLA, 2/5 in LUE, 3/5 in LLE, 4/5 in RUE/RLE  Basic Metabolic Panel: Recent Labs  Lab 08/23/22 1957 08/24/22 0108 08/24/22 0609 08/24/22 1342 08/25/22 0401 08/26/22 0427 08/27/22 0442  NA 137  --  137  --  140 141 139  K 2.6* 2.6* 2.6*  --  3.2* 3.2* 3.9  CL 99  --  99  --  106 104 103  CO2 29  --  29  --  29 31 27   GLUCOSE 173*  --  106*  --  120* 110* 133*  BUN 12  --  11  --  8 6* 7*  CREATININE 0.72  --  0.59  --  0.63 0.57 0.68  CALCIUM 7.8*  --  7.6*  --  7.5* 7.8* 8.2*  MG 1.5*  --  1.9  --  1.8 1.7 1.7  PHOS  --   --  3.3 3.0  --  2.6  --     CBC: Recent Labs  Lab 08/23/22 1957 08/26/22 0427 08/27/22 0442  WBC 5.3 5.3 5.5  NEUTROABS 3.2  --  2.5  HGB 12.4 11.4* 12.2  HCT 37.3 35.5* 36.8  MCV 93.0 96.2 96.8  PLT 206 165 168    Coagulation Studies: No results for input(s): "LABPROT", "INR" in the last 72 hours.  Imaging CTH wo contrast 08/23/2022:  No acute intracranial process. Interval development of encephalomalacia in the right parietal,occipital and temporal region compatible with patient's known prior MCA distribution infarct.. Diffuse brain atrophy and chronic ischemic changes are similar to prior.   ASSESSMENT AND PLAN: 89 yo F with right MCA and PCA infarct with  left hemiparesis now with post stroke epilepsy.    New onset post stroke epilepsy Hypoproteinemia with hypoalbuminemia Microcytic anemia Hypokalemia E Coli UTI ( completed treatment) - Seizure likely due to underlying stroke with lower seizure threshold in setting of UTI  Recommendations - No further seizure on EEG, will dc - Daughter reports worsening left sided weakness as well as intermittent crying spells at home. Worsening weakness could be due to stroke progression ( cannot confirm with MRI due to incompatible pacemaker) vs seizures vs depression. However, with crying spells will switch keppra to oxcarb to help with mood stabilization - Start Oxcarb150mg  BID, can increase if needed - Dc keppra - Continue Clonazepam 0.5mg  qhs, can dc if remains seizure free - Pt/OT - Seizure precautions - prn iv versed for clinical seizure - Discussed plan with daughter  I have spent a total of   38 minutes with the patient reviewing hospital notes,  test results, labs and examining the patient as well as establishing an assessment and plan that was discussed personally with the patient.  > 50% of time was spent in direct patient care.  Zeb Comfort Epilepsy [facility] For questions after 5pm please refer to AMION to reach the Neurologist on call

## 2022-08-28 ENCOUNTER — Other Ambulatory Visit (HOSPITAL_COMMUNITY): Payer: Self-pay

## 2022-08-28 DIAGNOSIS — Z515 Encounter for palliative care: Secondary | ICD-10-CM | POA: Diagnosis not present

## 2022-08-28 DIAGNOSIS — I6930 Unspecified sequelae of cerebral infarction: Secondary | ICD-10-CM | POA: Diagnosis not present

## 2022-08-28 DIAGNOSIS — Z7189 Other specified counseling: Secondary | ICD-10-CM | POA: Diagnosis not present

## 2022-08-28 DIAGNOSIS — R569 Unspecified convulsions: Secondary | ICD-10-CM | POA: Diagnosis not present

## 2022-08-28 LAB — GLUCOSE, CAPILLARY
Glucose-Capillary: 128 mg/dL — ABNORMAL HIGH (ref 70–99)
Glucose-Capillary: 157 mg/dL — ABNORMAL HIGH (ref 70–99)
Glucose-Capillary: 225 mg/dL — ABNORMAL HIGH (ref 70–99)

## 2022-08-28 LAB — BASIC METABOLIC PANEL
Anion gap: 6 (ref 5–15)
BUN: 6 mg/dL — ABNORMAL LOW (ref 8–23)
CO2: 31 mmol/L (ref 22–32)
Calcium: 8.3 mg/dL — ABNORMAL LOW (ref 8.9–10.3)
Chloride: 102 mmol/L (ref 98–111)
Creatinine, Ser: 0.72 mg/dL (ref 0.44–1.00)
GFR, Estimated: >60 mL/min (ref >60)
Glucose, Bld: 140 mg/dL — ABNORMAL HIGH (ref 70–99)
Potassium: 3.4 mmol/L — ABNORMAL LOW (ref 3.5–5.1)
Sodium: 139 mmol/L (ref 135–145)

## 2022-08-28 LAB — MAGNESIUM: Magnesium: 2 mg/dL (ref 1.7–2.4)

## 2022-08-28 MED ORDER — ZINC OXIDE 40 % EX OINT: TOPICAL_OINTMENT | Freq: Two times a day (BID) | CUTANEOUS | Status: DC

## 2022-08-28 MED ORDER — CLONAZEPAM 0.5 MG PO TBDP: 0.5000 mg | ORAL_TABLET | ORAL | Status: DC | PRN

## 2022-08-28 MED ORDER — GERHARDT'S BUTT CREAM
TOPICAL_CREAM | Freq: Two times a day (BID) | CUTANEOUS | Status: DC
  Administered 2022-08-30: 1 via TOPICAL
  Filled 2022-08-28: qty 1

## 2022-08-28 MED ORDER — FLUCONAZOLE 150 MG PO TABS
150.0000 mg | ORAL_TABLET | Freq: Once | ORAL | Status: AC
  Administered 2022-08-28: 150 mg via ORAL
  Filled 2022-08-28 (×2): qty 1

## 2022-08-28 MED ORDER — POTASSIUM CHLORIDE CRYS ER 20 MEQ PO TBCR
20.0000 meq | EXTENDED_RELEASE_TABLET | Freq: Once | ORAL | Status: AC
  Administered 2022-08-28: 20 meq via ORAL
  Filled 2022-08-28: qty 2

## 2022-08-28 NOTE — TOC Benefit Eligibility Note (Signed)
Patient Product/process development scientist completed.    The patient is currently admitted and upon discharge could be taking Valtoco 15 mg.  The current 30 day co-pay is $216.70 due to a $100.00 deductible.   The patient is insured through [employer]   This test claim was processed through [employer]- copay amounts may vary at other pharmacies due to [employer], or as the patient moves through the different stages of their insurance plan.  Roland Earl, CPHT Pharmacy Patient Advocate Specialist [facility] Patient Advocate Team Direct Number: [phone]  Fax: [phone]

## 2022-08-28 NOTE — Progress Notes (Signed)
Pt UOP low this shift, pt attempted to void x2 without success, bladder scan pt for 349, MD notified, I&O cath and Q6 bladder scans ordered.   Pt labia red and swollen, pt endorses some discomfort, barrier cream removed, pt peri area cleansed and dried, Pt I&O cath for amber urine, pt tolerated well, no bleeding noted, call bell in reach, visitor at bedside, will continue to monitor

## 2022-08-28 NOTE — Evaluation (Signed)
Clinical/Bedside Swallow Evaluation Patient Details  Name: Kara [name] MRN: [id number] Date of Birth: [birth year]  Today's Date: 08/28/2022 Time: SLP Start Time (ACUTE ONLY): 1300 SLP Stop Time (ACUTE ONLY): 1315 SLP Time Calculation (min) (ACUTE ONLY): 15 min  Past Medical History:  Past Medical History:  Diagnosis Date   Allergy    Atrial fibrillation    CAD (coronary artery disease)    Cancer    UTERINE   Chronic anticoagulation    Chronic diarrhea    Closed compression fracture of L2 lumbar vertebra, initial encounter 11/08/2020   By CT scan done to evaluate severe persistent low back pain:  Impression below also notes spinal compression from bulging disk.     Superior endplate compression fracture of L2 with approximately 25% height loss and minimal, 2-3 mm retropulsion of the superior endplate.   Left-sided, nondisplaced zone 1 sacral insufficiency fracture.   Multilevel degenerative changes of the lumbar spine resulting    Closed intertrochanteric fracture of left femur 08/22/2020   Decubitus ulcer    sacral region   Diabetes    diet controlled   Dysuria    E. coli UTI 11/21/2014   Edema of lower extremity    mainly right foot, slightly in left foot.   Facial fracture due to fall 07/13/2020   Femur fracture, left 06/09/2019   Fibrocystic breast disease    GERD (gastroesophageal reflux disease)    Glaucoma    Glaucoma    Hematuria    Hemorrhoids    History of colon polyps    History of pancreatitis    Hospital discharge follow-up 07/22/2020   Hyperlipidemia    Hypertension    Hypokalemia    IBS (irritable bowel syndrome)    Microscopic hematuria    Mitral valve regurgitation    Orbital fracture 07/13/2020   Osteoarthritis    fingers   Pernicious anemia    Plantar fasciitis    Recurrent UTI    Skin cancer    Sleep apnea, obstructive    Umbilical hernia without obstruction and without gangrene 06/26/2015   Vaginal atrophy    Vitamin D deficiency    Yeast vaginitis     Past Surgical History:  Past Surgical History:  Procedure Laterality Date   ABDOMINAL HYSTERECTOMY  1980's   ABDOMINAL SURGERY     for villous polyp,,,many years ago   APPENDECTOMY  1940's   ASCAD, s/p PTCA  11/28/2005   MID LESION    BREAST BIOPSY Left 1970's   CARPAL TUNNEL RELEASE Right 12/13/2015   Procedure: CARPAL TUNNEL RELEASE;  Surgeon: Juanell Fairly, MD;  Location: [facility] ORS;  Service: Orthopedics;  Laterality: Right;   COLECTOMY  2015   INTRAMEDULLARY (IM) NAIL INTERTROCHANTERIC Left 06/11/2019   Procedure: INTRAMEDULLARY (IM) NAIL INTERTROCHANTRIC;  Surgeon: Deeann Saint, MD;  Location: [facility] ORS;  Service: Orthopedics;  Laterality: Left;   IR CT HEAD LTD  05/01/2022   IR PERCUTANEOUS ART THROMBECTOMY/INFUSION INTRACRANIAL INC DIAG ANGIO  05/01/2022   IR US GUIDE VASC ACCESS RIGHT  05/01/2022   KYPHOPLASTY N/A 11/15/2020   Procedure: Nicki Reaper;  Surgeon: Kennedy Bucker, MD;  Location: [facility] ORS;  Service: Orthopedics;  Laterality: N/A;   PACEMAKER PLACEMENT     radation     for uterine cance   RADIOLOGY WITH ANESTHESIA N/A 05/01/2022   Procedure: IR WITH ANESTHESIA;  Surgeon: Radiologist, Medication, MD;  Location: [facility] OR;  Service: Radiology;  Laterality: N/A;   REFRACTIVE SURGERY     for bilateral  glaumoma   SACROPLASTY N/A 11/15/2020   Procedure: SACROPLASTY;  Surgeon: Kennedy Bucker, MD;  Location: [facility] ORS;  Service: Orthopedics;  Laterality: N/A;   TONSILLECTOMY  1936   HPI:  89 yo F with right MCA and PCA infarct with left hemiparesis now with post stroke epilepsy.  Patient seen by SLP for dysphagia in 04/2022 with recommendations for a regular diet, thin liquids.    Assessment / Plan / Recommendation  Clinical Impression  Patient presents with a functional oropharyngeal swallow with swift mastication and clearance of regular texture solid and no overt s/s of aspiration across liquid or solid consistencies. Family reports occassional coughing with thin  liquids but only when patient using a straw. Suspect that patient has some degree of baseline dysphagia (given h/o dysphagia treatment, advanced age, and h/o CVA), that becomes exacerbated with acute illnesses or lethargy. Straw likely allows patient to obtain larger bolus sizes with suspected age and/or CVA related delay in swallow initiation and results in some decrease in airway protection. Today however, patient appeared to be protecting airway with both straw and cup sips. Recommend continuation of regular solids with thin liquids. Straw to be removed with s/s of aspiration. No SLP f/u indicated at this time. SLP Visit Diagnosis: Dysphagia, unspecified (R13.10)    Aspiration Risk  Mild aspiration risk    Diet Recommendation Regular;Thin liquid   Liquid Administration via: Cup;Straw Medication Administration: Whole meds with liquid Supervision: Patient able to self feed;Intermittent supervision to cue for compensatory strategies Compensations: Slow rate;Small sips/bites;Minimize environmental distractions Postural Changes: Seated upright at 90 degrees    Other  Recommendations Oral Care Recommendations: Oral care BID    Recommendations for follow up therapy are one component of a multi-disciplinary discharge planning process, led by the attending physician.  Recommendations may be updated based on patient status, additional functional criteria and insurance authorization.  Follow up Recommendations No SLP follow up        Swallow Study   General HPI: 89 yo F with right MCA and PCA infarct with left hemiparesis now with post stroke epilepsy.  Patient seen by SLP for dysphagia in 04/2022 with recommendations for a regular diet, thin liquids. Type of Study: Bedside Swallow Evaluation Previous Swallow Assessment: see HPI Diet Prior to this Study: Regular;Thin liquids (Level 0) Temperature Spikes Noted: No Respiratory Status: Room air History of Recent Intubation:  No Behavior/Cognition: Cooperative;Pleasant mood;Lethargic/Drowsy (but arousable) Oral Cavity Assessment: Dry Oral Care Completed by SLP: Recent completion by staff Oral Cavity - Dentition: Adequate natural dentition Vision: Functional for self-feeding Self-Feeding Abilities: Able to feed self Patient Positioning: Upright in bed Baseline Vocal Quality: Normal Volitional Cough: Strong Volitional Swallow: Able to elicit    Oral/Motor/Sensory Function Overall Oral Motor/Sensory Function: Within functional limits   Ice Chips Ice chips: Within functional limits   Thin Liquid Thin Liquid: Within functional limits Presentation: Cup;Straw;Self Fed    Nectar Thick Nectar Thick Liquid: Not tested   Honey Thick Honey Thick Liquid: Not tested   Puree Puree: Within functional limits Presentation: Spoon;Self Fed   Solid     Solid: Within functional limits Presentation: Self Fed     [employer] MA, CCC-SLP  Pasqualino Witherspoon Meryl 08/28/2022,1:45 PM

## 2022-08-28 NOTE — Consult Note (Signed)
WOC Nurse Consult Note: Reason for Consult: evaluate sacrum/buttocks at families request  Wound type: 1.   Moisture Associated Skin Damage buttocks/sacrum 2.  Intertriginous dermatitis bilateral groin   ICD-10 CM Codes for Irritant Dermatitis  L24A2 - Due to fecal, urinary or dual incontinence  L30.4  - Erythema intertrigo. Also used for abrasion of the hand, chafing of the skin, dermatitis due to sweating and friction, friction dermatitis, friction eczema, and genital/thigh intertrigo.  Pressure Injury POA: NA  Patient with erythema and areas of partial thickness skin loss to sacrum, buttocks and bilateral groin/inner thighs.  Wound bed: pink and moist  Drainage (amount, consistency, odor) none Periwound: patient noted to have scar tissue from old healed pressure injury sacrum  Dressing procedure/placement/frequency: Clean buttocks, sacrum, perineum and B groin with soap and water or no rinse cleanser, dry thoroughly, apply Gerhardts Butt Cream to entire area 2 times a day and prn soiling.  May sprinkle bilateral groin and inner thigh with floor stock Microguard antifungal powder as well.    Daughter states she feels silicone foam is causing more damage to skin.  We did discuss how the foam can sometimes hold moisture against the skin so do not cover area with foam at this time.  Utilize Dermatherapy linen pad only when available.    Low air loss mattress for pressure redistribution and moisture management.  Patient was having diarrhea per family yesterday, had a flexiseal in that is now being removed.    POC discussed with patient, family and bedside nurse.   WOC team will not follow at this time.  Re-consult if further needs arise.   Thank you,    Priscella Mann MSN, RN-BC, [employer] [phone]

## 2022-08-28 NOTE — Progress Notes (Signed)
OCIE KALB  DJS:[id number] DOB: 12-21-1922 DOA: 08/26/2022 PCP: Sherlene Shams, MD    Brief Narrative:  89 year old with a history of permanent atrial fibrillation, SSS status post pacemaker placement, DM2, ischemic stroke December 2023 with residual left-sided paresis, HTN, and depression who was transferred to [facility] from [facility]/9/24 after she initially presented to that campus with seizure activity exhibited as left sided facial and extremity twitching.  She was initially transported to [facility] via EMS with this seizure activity, and the activity ceased when treated with Versed.  During her course at [facility] Neurology was consulted, she was started on Keppra 500 mg twice daily as well as clonazepam nightly.  It was then felt that she needed to undergo long-term EEG monitoring and therefore she was transferred to [facility].  Consultants:  Neurology  Goals of Care:  Code Status: DNR   DVT prophylaxis: Eliquis  Interim Hx: Long-term EEG was discontinued 4/10 as there was no further evidence of ongoing seizure activity.  Neurology has adjusted the patient's seizure medications.  The patient had an episode of urinary retention yesterday with I/O cath producing 450 cc of urine. She is more alert today. She wants to have her rectal tube removed. She denies cp or sob.   Assessment & Plan:  New onset seizure activity Felt to be related to ischemic CVA December 2023 with encephalomalacia superimposed on diffuse brain atrophy in the setting of chronic ischemic change, and likely further exacerbated by presence of UTI - long-term EEG monitoring confirmed resolution of seizures and was discontinued yesterday - antiepileptic drug titration per Neurology with change to [facility] due to concomitant depression  Acute cystitis POA Diagnosed as an outpatient 08/22/2022 - initially refused antibiotics - Rocephin initiated during hospitalization at [facility] - pt has completed 3+ days of abx tx -culture at [facility] revealed  E. coli which was susceptible to Rocephin (but was notably resistant to Cipro)  Hypokalemia Corrected with supplementation - monitor intermittently - pt despises oral KCl and has extreme difficulty taking it   Hypomagnesemia Corrected with supplementation  Permanent atrial fibrillation CHA2DS2-VASc is 8 -on chronic Eliquis -continue metoprolol -rate controlled  DM2 Does not require medical therapy in the outpatient setting -CBG well-controlled  HTN Blood pressure well-controlled at present  Depression Continue usual Celexa dose  Family Communication: Spoke with daughter at bedside Disposition: Was in SNF for rehab prior to this admission -lived independently prior to her CVA December 2023 - stabilizing some - may be able to return to SNF if no further acute decline    Objective: Blood pressure (!) 140/77, pulse 65, temperature 97.8 F (36.6 C), temperature source Oral, resp. rate 17, height 5\' 2"  (1.575 m), weight 59.5 kg, SpO2 93 %.  Intake/Output Summary (Last 24 hours) at 08/28/2022 0805 Last data filed at 08/28/2022 0430 Gross per 24 hour  Intake 120 ml  Output 450 ml  Net -330 ml   Filed Weights   08/26/22 2052 08/27/22 0404  Weight: 59.4 kg 59.5 kg    Examination: General: No acute respiratory distress Lungs: Clear to auscultation bilaterally without wheezes or crackles Cardiovascular: Regular rate and rhythm without murmur  Abdomen: Nontender, nondistended, soft, bowel sounds positive, no rebound, no ascites Extremities: No significant cyanosis, clubbing, or edema bilateral lower extremities - dependent edema of sacrum and vulva is noted  CBC: Recent Labs  Lab 08/23/22 1957 08/26/22 0427 08/27/22 0442  WBC 5.3 5.3 5.5  NEUTROABS 3.2  --  2.5  HGB 12.4 11.4*  12.2  HCT 37.3 35.5* 36.8  MCV 93.0 96.2 96.8  PLT 206 165 168    Basic Metabolic Panel: Recent Labs  Lab 08/24/22 0609 08/24/22 1342 08/25/22 0401 08/26/22 0427 08/27/22 0442 08/28/22 0408   NA 137  --    < > 141 139 139  K 2.6*  --    < > 3.2* 3.9 3.4*  CL 99  --    < > 104 103 102  CO2 29  --    < > 31 27 31   GLUCOSE 106*  --    < > 110* 133* 140*  BUN 11  --    < > 6* 7* 6*  CREATININE 0.59  --    < > 0.57 0.68 0.72  CALCIUM 7.6*  --    < > 7.8* 8.2* 8.3*  MG 1.9  --    < > 1.7 1.7 2.0  PHOS 3.3 3.0  --  2.6  --   --    < > = values in this interval not displayed.    GFR: Estimated Creatinine Clearance: 31.8 mL/min (by C-G formula based on SCr of 0.72 mg/dL).   Scheduled Meds:  apixaban  2.5 mg Oral BID   clonazePAM  0.5 mg Oral QHS   insulin aspart  0-6 Units Subcutaneous TID WC   metoprolol tartrate  12.5 mg Oral BID   OXcarbazepine  150 mg Oral BID   pantoprazole  40 mg Oral QHS     LOS: 2 days   Lonia Blood, MD [facility] Office  [phone] Pager - Text Page per Loretha Stapler  If 7PM-7AM, please contact night-coverage per Amion 08/28/2022, 8:05 AM

## 2022-08-28 NOTE — Consult Note (Signed)
   [facility] CM Inpatient Consult   08/28/2022  Kara [name] 12/08/1922 [id number]  [facility] [[facility]]  Accountable Care Organization [ACO] Patient:  Medicare ACO REACH  Primary Care Provider:  Sherlene Shams, MD with [place] at [facility] which is listed to provide the transition of care follow up  Patient screened for less than 30 days readmission hospitalization with noted high risk score for unplanned readmission risk and  to assess for potential [facility]  [[facility]] Care Management service needs for post hospital transition for care coordination.  Review of patient's electronic medical record reveals patient has a history with a [facility] RN[facility] recently per encounter review.  Also, readmission seems to be a transfer from [facility] noted for direct admission from 08/26/22 to [facility] for continuious EEG needs. ? Returned to [facility] and has caregivers 24/7 prior to admission noted.  Plan:  Continue to follow progress and disposition to assess for post hospital community care coordination/management needs.  Referral request for community care coordination: depending on disposition needs per Palliative consult note reviewed.  Of note, [facility] Care Management/Population Health does not replace or interfere with any arrangements made by the Inpatient Transition of Care team.  For questions contact:   Charlesetta Shanks, RN BSN CCM [facility]  [phone] business mobile phone Toll free office [phone]  *Concierge Line  [phone] Fax number: [phone] [email or web address]

## 2022-08-28 NOTE — Progress Notes (Signed)
Palliative Medicine Progress Note   Patient Name: Kara [name]       Date: 08/28/2022 DOB: 15-Jul-1922  Age: 89 y.o. MRN#: [id number] Attending Physician: Lonia Blood, MD Primary Care Physician: Sherlene Shams, MD Admit Date: 08/26/2022    HPI/Patient Profile: 89 y.o. female  with past medical history of atrial fibrillation, sick sinus syndrome s/p pacemaker placement, type 2 diabetes mellitus, hypertension, depression, and CVA in December 2023 with residual left-sided weakness. She presented to [facility] on 08/23/22 with possible seizure activity involving the face as well as left upper and lower extremities.  Transferred to [facility] on 08/26/2022 for continuous EEG.    Palliative Medicine has been consulted for goals of care.  Subjective: Chart reviewed.  Patient had urinary retention overnight and required I&O cath. EEG did not show seizure activity and has been discontinued.   Bedside visit. Patient is coughing; daughter reports she had sips of water earlier. She remains lethargic.   Discussed with daughter/[name] about patient's urinary retention and risks/benefits of indwelling catheter. Serina Cowper continues to consider hospice versus PT at discharge, but seems to be leaning more toward hospice at this time.   Plan of care discussed with Dr. Sharon Seller.    Objective:  Physical Exam Vitals reviewed.  Constitutional:      General: She is not in acute distress.    Appearance: She is ill-appearing.  Pulmonary:     Effort: Pulmonary effort is normal.  Neurological:     Mental Status: She is lethargic.     Motor: Weakness present.            Vital Signs: BP (!) 121/57 (BP Location: Right Arm)   Pulse 65   Temp 98 F (36.7 C) (Oral)   Resp 17   Ht 5\' 2"  (1.575 m)   Wt 59.5 kg   SpO2 92%    BMI 23.99 kg/m  SpO2: SpO2: 92 % O2 Device: O2 Device: Room Air   Palliative Medicine Assessment & Plan   Assessment: Principal Problem:   Seizure Active Problems:   Permanent atrial fibrillation   Acute cystitis   Essential hypertension   Hypokalemia   DM2 (diabetes mellitus, type 2)   Hypomagnesemia   Depression   GERD (gastroesophageal reflux disease)    Recommendations/Plan: Continue current medical interventions Pending PT and SLP evaluations  Ongoing palliative support   Code Status: DNR   Discharge Planning: Likely return to apartment at [facility] with 24/7 private care    Thank you for allowing the Palliative Medicine Team to assist in the care of this patient.     Merry Proud, NP   Please contact Palliative Medicine Team phone at [phone] for questions and concerns.  For individual providers, please see AMION.

## 2022-08-28 NOTE — Progress Notes (Signed)
Subjective: No clinical seizures. Had issues with voiding urine overnight therefore could not sleep after 2 AM.   Relatively more awake this morning.  Daughter at bedside.  ROS: negative except above Examination  Vital signs in last 24 hours: Temp:  [97.6 F (36.4 C)-98 F (36.7 C)] 97.8 F (36.6 C) (04/11 0803) Pulse Rate:  [65-84] 65 (04/11 0803) Resp:  [17-18] 17 (04/11 0803) BP: (113-144)/(56-80) 140/77 (04/11 0803) SpO2:  [90 %-95 %] 93 % (04/11 0803)  General: lying in bed, NAD Neuro: MS: Alert, oriented, follows commands CN: pupils equal and reactive,  EOMI, face symmetric, tongue midline, normal sensation over face, left hemineglect Motor: 3/5 in right upper extremity, 2/5 in right lower extremity, 2/5 in left upper and left lower extremity  Basic Metabolic Panel: Recent Labs  Lab 08/24/22 0609 08/24/22 1342 08/25/22 0401 08/26/22 0427 08/27/22 0442 08/28/22 0408  NA 137  --  140 141 139 139  K 2.6*  --  3.2* 3.2* 3.9 3.4*  CL 99  --  106 104 103 102  CO2 29  --  29 31 27 31   GLUCOSE 106*  --  120* 110* 133* 140*  BUN 11  --  8 6* 7* 6*  CREATININE 0.59  --  0.63 0.57 0.68 0.72  CALCIUM 7.6*  --  7.5* 7.8* 8.2* 8.3*  MG 1.9  --  1.8 1.7 1.7 2.0  PHOS 3.3 3.0  --  2.6  --   --     CBC: Recent Labs  Lab 08/23/22 1957 08/26/22 0427 08/27/22 0442  WBC 5.3 5.3 5.5  NEUTROABS 3.2  --  2.5  HGB 12.4 11.4* 12.2  HCT 37.3 35.5* 36.8  MCV 93.0 96.2 96.8  PLT 206 165 168     Coagulation Studies: No results for input(s): "LABPROT", "INR" in the last 72 hours.  Imaging No new brain imaging overnight  ASSESSMENT AND PLAN: 89 yo F with right MCA and PCA infarct with left hemiparesis now with post stroke epilepsy.     New onset post stroke epilepsy Hypoproteinemia with hypoalbuminemia Microcytic anemia Hypokalemia E Coli UTI ( completed treatment) - Seizure likely due to underlying stroke with lower seizure threshold in setting of UTI    Recommendations -Continue Oxcarb150mg  BID -Switch clonazepam 2.5 mg as needed for clinical seizures, insomnia, anxiety -Rescue medication: Co-pay for intranasal Valtoco is over $200.  If possible, pending family can - PT/OT - Seizure precautions -Follow-up with Dr. Sherryll Burger at [facility] in [place] in about 4 weeks.  May need BMP checked at that time to make sure she is not developing hyponatremia due to oxcarbazepine - Discussed plan with daughter at bedside -Discussed plan with Dr. Sharon Seller via secure chat  Seizure precautions: Per [facility] statutes, patients with seizures are not allowed to drive until they have been seizure-free for six months and cleared by a physician    Use caution when using heavy equipment or power tools. Avoid working on ladders or at heights. Take showers instead of baths. Ensure the water temperature is not too high on the home water heater. Do not go swimming alone. Do not lock yourself in a room alone (i.e. bathroom). When caring for infants or small children, sit down when holding, feeding, or changing them to minimize risk of injury to the child in the event you have a seizure. Maintain good sleep hygiene. Avoid alcohol.    If patient has another seizure, call 911 and bring them back to the  ED if: A.  The seizure lasts longer than 5 minutes.      B.  The patient doesn't wake shortly after the seizure or has new problems such as difficulty seeing, speaking or moving following the seizure C.  The patient was injured during the seizure D.  The patient has a temperature over 102 F (39C) E.  The patient vomited during the seizure and now is having trouble breathing    During the Seizure   - First, ensure adequate ventilation and place patients on the floor on their left side  Loosen clothing around the neck and ensure the airway is patent. If the patient is clenching the teeth, do not force the mouth open with any object as this can cause severe  damage - Remove all items from the surrounding that can be hazardous. The patient may be oblivious to what's happening and may not even know what he or she is doing. If the patient is confused and wandering, either gently guide him/her away and block access to outside areas - Reassure the individual and be comforting - Call 911. In most cases, the seizure ends before EMS arrives. However, there are cases when seizures may last over 3 to 5 minutes. Or the individual may have developed breathing difficulties or severe injuries. If a pregnant patient or a person with diabetes develops a seizure, it is prudent to call an ambulance. - Finally, if the patient does not regain full consciousness, then call EMS. Most patients will remain confused for about 45 to 90 minutes after a seizure, so you must use judgment in calling for help.   After the Seizure (Postictal Stage)   After a seizure, most patients experience confusion, fatigue, muscle pain and/or a headache. Thus, one should permit the individual to sleep. For the next few days, reassurance is essential. Being calm and helping reorient the person is also of importance.   Most seizures are painless and end spontaneously. Seizures are not harmful to others but can lead to complications such as stress on the lungs, brain and the heart. Individuals with prior lung problems may develop labored breathing and respiratory distress.     I have spent a total of   37 minutes with the patient reviewing hospital notes,  test results, labs and examining the patient as well as establishing an assessment and plan that was discussed personally with the patient.  > 50% of time was spent in direct patient care.    Lindie Spruce Epilepsy [facility] For questions after 5pm please refer to AMION to reach the Neurologist on call

## 2022-08-29 DIAGNOSIS — Z7189 Other specified counseling: Secondary | ICD-10-CM | POA: Diagnosis not present

## 2022-08-29 DIAGNOSIS — Z8673 Personal history of transient ischemic attack (TIA), and cerebral infarction without residual deficits: Secondary | ICD-10-CM | POA: Diagnosis not present

## 2022-08-29 DIAGNOSIS — R569 Unspecified convulsions: Secondary | ICD-10-CM | POA: Diagnosis not present

## 2022-08-29 DIAGNOSIS — Z515 Encounter for palliative care: Secondary | ICD-10-CM | POA: Diagnosis not present

## 2022-08-29 LAB — GLUCOSE, CAPILLARY
Glucose-Capillary: 121 mg/dL — ABNORMAL HIGH (ref 70–99)
Glucose-Capillary: 193 mg/dL — ABNORMAL HIGH (ref 70–99)
Glucose-Capillary: 213 mg/dL — ABNORMAL HIGH (ref 70–99)

## 2022-08-29 LAB — BASIC METABOLIC PANEL
Anion gap: 8 (ref 5–15)
BUN: 7 mg/dL — ABNORMAL LOW (ref 8–23)
CO2: 29 mmol/L (ref 22–32)
Calcium: 8.2 mg/dL — ABNORMAL LOW (ref 8.9–10.3)
Chloride: 99 mmol/L (ref 98–111)
Creatinine, Ser: 0.69 mg/dL (ref 0.44–1.00)
GFR, Estimated: >60 mL/min (ref >60)
Glucose, Bld: 130 mg/dL — ABNORMAL HIGH (ref 70–99)
Potassium: 3.3 mmol/L — ABNORMAL LOW (ref 3.5–5.1)
Sodium: 136 mmol/L (ref 135–145)

## 2022-08-29 LAB — CBC
HCT: 40.8 % (ref 36.0–46.0)
Hemoglobin: 13.5 g/dL (ref 12.0–15.0)
MCH: 31.5 pg (ref 26.0–34.0)
MCHC: 33.1 g/dL (ref 30.0–36.0)
MCV: 95.3 fL (ref 80.0–100.0)
Platelets: 154 10*3/uL (ref 150–400)
RBC: 4.28 MIL/uL (ref 3.87–5.11)
RDW: 14.6 % (ref 11.5–15.5)
WBC: 5.9 10*3/uL (ref 4.0–10.5)
nRBC: 0 % (ref 0.0–0.2)

## 2022-08-29 LAB — MAGNESIUM: Magnesium: 1.7 mg/dL (ref 1.7–2.4)

## 2022-08-29 MED ORDER — POTASSIUM CHLORIDE 20 MEQ PO PACK
20.0000 meq | PACK | Freq: Two times a day (BID) | ORAL | Status: DC
  Administered 2022-08-29 – 2022-08-30 (×3): 20 meq via ORAL
  Filled 2022-08-29 (×3): qty 1

## 2022-08-29 MED ORDER — BETHANECHOL CHLORIDE 10 MG PO TABS
10.0000 mg | ORAL_TABLET | Freq: Three times a day (TID) | ORAL | Status: DC
  Administered 2022-08-29 – 2022-08-30 (×3): 10 mg via ORAL
  Filled 2022-08-29 (×3): qty 1

## 2022-08-29 NOTE — Evaluation (Signed)
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 Transferred from [facility] to [facility] for continuous EEG. PMH: A fib, CVA w/ L sided deficits (12/23), DM2, PPM, depression   Clinical Impression   PTA, pt lives in an ILF apartment at [facility] with 24/7 caregiver assist. Previously, pt was able to transfer and complete ADLs w/ assist. However, pt has recently been bedbound and requiring ADL assist bed level. With assist of caregiver, guided pt in bed mobility with Total A x 2 with Min-Mod A for sitting balance. Pt requires Max A-Total A for ADLs bed level. Extended time spent educating and discussing: ADL mgmt bed level, sitting balance determining transfer abilities, positioning and pressure relief bed level, stimulation on L side and encouragement of pt to complete as many basic tasks as independently as possible.       Recommendations for follow up therapy are one component of a multi-disciplinary discharge planning process, led by the attending physician.  Recommendations may be updated based on patient status, additional functional criteria and insurance authorization.   Assistance Recommended at Discharge Frequent or constant Supervision/Assistance  Patient can return home with the following Two people to help with bathing/dressing/bathroom;A lot of help with walking and/or transfers;Assistance with cooking/housework;Assist for transportation;Help with stairs or ramp for entrance    Functional Status Assessment  Patient has had a recent decline in their functional status and demonstrates the ability to make significant improvements in function in a reasonable and predictable amount of time.  Equipment Recommendations  Other (comment) (air mattress overlay, hoyer  lift)    Recommendations for Other Services       Precautions / Restrictions Precautions Precautions: Fall Precaution Comments: L hemiparesis, L inattention Restrictions Weight Bearing Restrictions: No      Mobility Bed Mobility Overal bed mobility: Needs Assistance Bed Mobility: Supine to Sit, Sit to Supine     Supine to sit: Total assist, +2 for physical assistance, +2 for safety/equipment, HOB elevated Sit to supine: Total assist, +2 for physical assistance, +2 for safety/equipment   General bed mobility comments: minimal initiation to get to EOB, likely due to self limiting behaviors rather than functional skill abilities. able to reach R UE to bedrail to assist in "correcting balance' initially and then to return to bed    Transfers                          Balance Overall balance assessment: Needs assistance Sitting-balance support: Feet supported, Single extremity supported Sitting balance-Leahy Scale: Poor Sitting balance - Comments: pushing back at times when distracted or during dynamic tasks. with cues to reach to R bedrail "to lay down", pt able to fix balance statically and lean to R. Postural control: Left lateral lean, Posterior lean                                 ADL either performed or assessed with clinical judgement   ADL Overall ADL's : Needs assistance/impaired Eating/Feeding: Moderate assistance;Bed level   Grooming: Moderate assistance;Bed level Grooming Details (indicate cue type and reason): able to reach to scratch ear, touch hair, etc Upper Body Bathing: Maximal assistance;Bed level   Lower Body Bathing: Total assistance;Bed level  Upper Body Dressing : Maximal assistance;Bed level   Lower Body Dressing: Total assistance;Bed level Lower Body Dressing Details (indicate cue type and reason): sock mgmt     Toileting- Clothing Manipulation and Hygiene: Total assistance;Bed level         General ADL Comments:  Discussed ADL mgmt bed level, provided fracture bed pan to assess for comfort changes due to sore bottom, pressure relief, repositioning, air mattress overlay, sitting balance determining appropriateness for [facility] transfers or shower chair use     Vision Baseline Vision/History: 1 Wears glasses Ability to See in Adequate Light: 2 Moderately impaired Patient Visual Report: Other (comment) (new since CVA in Dec) Vision Assessment?: Vision impaired- to be further tested in functional context Additional Comments: L inattention. with cues and increased time, able to gaze to midline and slightly left to locate OT.     Perception     Praxis      Pertinent Vitals/Pain Pain Assessment Pain Assessment: Faces Faces Pain Scale: Hurts little more Pain Location: bottom/low back Pain Descriptors / Indicators: Grimacing, Guarding, Sore, Moaning Pain Intervention(s): Monitored during session, Limited activity within patient's tolerance, Repositioned     Hand Dominance Right   Extremity/Trunk Assessment Upper Extremity Assessment Upper Extremity Assessment: LUE deficits/detail LUE Deficits / Details: densely hemiplegic, slight movement noted in L hand/forearm. tendency to push back at times LUE Coordination: decreased fine motor;decreased gross motor   Lower Extremity Assessment Lower Extremity Assessment: Defer to PT evaluation   Cervical / Trunk Assessment Cervical / Trunk Assessment: Normal   Communication Communication Communication: No difficulties   Cognition Arousal/Alertness: Awake/alert Behavior During Therapy: WFL for tasks assessed/performed Overall Cognitive Status: Impaired/Different from baseline Area of Impairment: Following commands, Safety/judgement, Memory, Problem solving, Awareness, Attention, Orientation                 Orientation Level: Disoriented to, Time, Situation Current Attention Level: Sustained Memory: Decreased short-term memory Following Commands:  Follows one step commands with increased time, Follows one step commands consistently Safety/Judgement: Decreased awareness of safety, Decreased awareness of deficits Awareness: Intellectual Problem Solving: Slow processing, Difficulty sequencing, Requires verbal cues General Comments: able to follow directions though self limiting at times, perseverting on being cold and back/bottom hurting. cues to attempt to complete tasks without assist     General Comments  Caregiver, Ginger, and daughter present    Exercises     Shoulder Instructions      Home Living Family/patient expects to be discharged to:: Private residence Living Arrangements: Alone Available Help at Discharge: Personal care attendant;Family;Available 24 hours/day Type of Home: Independent living facility Home Access: Level entry     Home Layout: One level     Bathroom Shower/Tub: Producer, television/film/video: Handicapped height Bathroom Accessibility: Yes How Accessible: Accessible via wheelchair Home Equipment: Cane - single point;Rolling Walker (2 wheels);Rollator (4 wheels);Shower seat;Hospital bed;Wheelchair - manual   Additional Comments: now in her apartment at [facility] with 24/7 support      Prior Functioning/Environment Prior Level of Function : Needs assist             Mobility Comments: has been bedbound for past week, typically was able to walk/transfer with 2 person assist ADLs Comments: Recently increased assist for ADLs, assist for bathing, dressing and toileting bed level; using bedpan, briefs. able to feed self some        OT Problem List: Decreased strength;Decreased activity tolerance;Decreased safety awareness;Impaired balance (sitting and/or standing);Decreased knowledge of use of DME or  AE;Decreased cognition;Decreased range of motion;Impaired UE functional use      OT Treatment/Interventions: Self-care/ADL training;Therapeutic exercise;Therapeutic activities;Energy  conservation;DME and/or AE instruction;Patient/family education;Balance training;Cognitive remediation/compensation;Neuromuscular education;Visual/perceptual remediation/compensation    OT Goals(Current goals can be found in the care plan section) Acute Rehab OT Goals Patient Stated Goal: get covered up, daughter would like to resolve urinary retention issues OT Goal Formulation: With patient/family Time For Goal Achievement: 09/12/22 Potential to Achieve Goals: Fair  OT Frequency: Min 2X/week    Co-evaluation              AM-PAC OT "6 Clicks" Daily Activity     Outcome Measure Help from another person eating meals?: A Lot Help from another person taking care of personal grooming?: A Lot Help from another person toileting, which includes using toliet, bedpan, or urinal?: Total Help from another person bathing (including washing, rinsing, drying)?: A Lot Help from another person to put on and taking off regular upper body clothing?: A Lot Help from another person to put on and taking off regular lower body clothing?: Total 6 Click Score: 10   End of Session Nurse Communication: Mobility status  Activity Tolerance: Patient limited by fatigue Patient left: in bed;with call bell/phone within reach;with bed alarm set;with family/visitor present  OT Visit Diagnosis: Unsteadiness on feet (R26.81);Repeated falls (R29.6);Muscle weakness (generalized) (M62.81)                Time: 1610-9604 OT Time Calculation (min): 35 min Charges:  OT General Charges $OT Visit: 1 Visit OT Evaluation $OT Eval Moderate Complexity: 1 Mod OT Treatments $Self Care/Home Management : 8-22 mins  Bradd Canary, OTR/L Acute Rehab Services Office: [phone]   Lorre Munroe 08/29/2022, 8:56 AM

## 2022-08-29 NOTE — Progress Notes (Signed)
Kara [name]  NGE:[id number] DOB: 06/30/1922 DOA: 08/26/2022 PCP: Sherlene Shams, MD    Brief Narrative:  89 year old with a history of permanent atrial fibrillation, SSS status post pacemaker placement, DM2, ischemic stroke December 2023 with residual left-sided paresis, HTN, and depression who was transferred to [facility] from [facility]/9/24 after she initially presented to that campus with seizure activity exhibited as left sided facial and extremity twitching.  She was initially transported to [facility] via EMS with this seizure activity, and the activity ceased when treated with Versed.  During her course at [facility] Neurology was consulted, she was started on Keppra 500 mg twice daily as well as clonazepam nightly.  It was then felt that she needed to undergo long-term EEG monitoring and therefore she was transferred to [facility].  Consultants:  Neurology  Goals of Care:  Code Status: DNR   DVT prophylaxis: Eliquis  Interim Hx: No acute events reported overnight.  Afebrile.  Vital signs stable.  Did have a repeat episode of urinary retention this morning with bladder scan showing >400 cc retained urine.  Sedate at the time of my exam but able to answer questions when awakened.  Assessment & Plan:  New onset seizure activity Felt to be related to ischemic CVA December 2023 with encephalomalacia superimposed on diffuse brain atrophy in the setting of chronic ischemic change, and likely further exacerbated by presence of UTI - long-term EEG monitoring confirmed resolution of seizures and was discontinued 08/27/22 - antiepileptic drug titration per Neurology with change to Oxcarb due to concomitant depression  Acute cystitis POA Diagnosed as an outpatient 08/22/2022 - initially refused antibiotics - Rocephin initiated during hospitalization at [facility] - pt has completed 3+ days of abx tx - culture at [facility] revealed E. coli which was susceptible to Rocephin (but was notably resistant to Cipro) - no clinical  evidence of persisting UTI  Recurring urinary retention Likely related to bedridden status with very limited to almost no mobility -give trial of Urecholine but if continues to experience recurring episodes of retention in excess of 400 cc will require placement of Foley catheter to prevent renal failure also as a comfort measure  Hypokalemia Corrected with supplementation - monitor intermittently - pt despises oral KCl and has extreme difficulty taking it   Hypomagnesemia Corrected with supplementation -follow for now  Permanent atrial fibrillation CHA2DS2-VASc is 8 -on chronic Eliquis -continue metoprolol -rate controlled  DM2 Does not require medical therapy in the outpatient setting -CBG well-controlled  HTN Blood pressure well-controlled at present  Depression Continue usual Celexa dose  Family Communication: Spoke with daughter at bedside Disposition: Was in SNF for rehab prior to this admission -lived independently prior to her CVA December 2023 - current plan is to return to her apartment at [facility] with 24/7 private care with determination to be made on continuing PT versus adding hospice support   Objective: Blood pressure (!) 145/58, pulse 67, temperature 97.7 F (36.5 C), temperature source Oral, resp. rate 17, height  (1.575 m), weight 59.5 kg, SpO2 94 %.  Intake/Output Summary (Last 24 hours) at 08/29/2022 0743 Last data filed at 08/29/2022 0645 Gross per 24 hour  Intake 120 ml  Output 500 ml  Net -380 ml    Filed Weights   08/26/22 2052 08/27/22 0404  Weight: 59.4 kg 59.5 kg    Examination: General: No acute respiratory distress - sedate  Lungs: Clear to auscultation bilaterally  Cardiovascular: Regular rate and rhythm without murmur  Abdomen: Nontender,  nondistended, soft, bowel sounds positive Extremities: No significant edema bilateral lower extremities   CBC: Recent Labs  Lab 08/23/22 1957 08/26/22 0427 08/27/22 0442  WBC 5.3 5.3 5.5   NEUTROABS 3.2  --  2.5  HGB 12.4 11.4* 12.2  HCT 37.3 35.5* 36.8  MCV 93.0 96.2 96.8  PLT 206 165 168    Basic Metabolic Panel: Recent Labs  Lab 08/24/22 0609 08/24/22 1342 08/25/22 0401 08/26/22 0427 08/27/22 0442 08/28/22 0408  NA 137  --    < > 141 139 139  K 2.6*  --    < > 3.2* 3.9 3.4*  CL 99  --    < > 104 103 102  CO2 29  --    < > 31 27 31   GLUCOSE 106*  --    < > 110* 133* 140*  BUN 11  --    < > 6* 7* 6*  CREATININE 0.59  --    < > 0.57 0.68 0.72  CALCIUM 7.6*  --    < > 7.8* 8.2* 8.3*  MG 1.9  --    < > 1.7 1.7 2.0  PHOS 3.3 3.0  --  2.6  --   --    < > = values in this interval not displayed.    GFR: Estimated Creatinine Clearance: 31.8 mL/min (by C-G formula based on SCr of 0.72 mg/dL).   Scheduled Meds:  apixaban  2.5 mg Oral BID   Gerhardt's butt cream   Topical BID   metoprolol tartrate  12.5 mg Oral BID   OXcarbazepine  150 mg Oral BID   pantoprazole  40 mg Oral QHS     LOS: 3 days   Lonia Blood, MD [facility] Office  [phone] Pager - Text Page per Loretha Stapler  If 7PM-7AM, please contact night-coverage per Amion 08/29/2022, 7:43 AM

## 2022-08-29 NOTE — Progress Notes (Signed)
[facility] 5K53 [facility]) [facility] Liaision Note  Received request from [facility] [place] for hospice services at [facility] after discharge.  Called and spoke with patient's daughter, Gwyneth Revels, to initiate education related to hospice philosophy, services and the team approach to care.  Patient/family verbalized understanding of information given.    Per discussion, the plan is for discharge to [facility] by PTAR.  DME needs discussed.  Daughter confirmed she privately purchased a wheelchair and bed for the patient to use after discharge.  Daughter informed that additional equipment needs would be accessed during first hospice visit.  No additional equipment needs identified at this time.  Please send signed and completed DNR with patient on discharge to facility.  Please provide prescriptions at discharge as needed to ensure ongoing symptom management.  Thank you for the opportunity to participate in this patient's care.  Please call with any hospice related questions or concerns.  [name], RN, BSN [facility] Liaision [phone]

## 2022-08-29 NOTE — Care Management Important Message (Signed)
Important Message  Patient Details  Name: Kara [name] MRN: [id number] Date of Birth: 1923-02-17   Medicare Important Message Given:  Yes     Marlia Schewe Stefan Church 08/29/2022, 2:41 PM

## 2022-08-29 NOTE — TOC Initial Note (Signed)
Transition of Care [facility]) - Initial/Assessment Note    Patient Details  Name: Kara [name] MRN: [id number] Date of Birth: 25-Aug-1922  Transition of Care [facility]) CM/SW Contact:    Baldemar Lenis, LCSW Phone Number: 08/29/2022, 3:01 PM  Clinical Narrative:       CSW spoke with daughter, Serina Cowper, to discuss disposition options. Family is wanting to take the patient home to her IDL apartment at [facility] and initiate hospice care. Patient already established with [facility] for palliative care. Daughter has already obtained a wheelchair and hospital bed for the patient, set up for her this morning. [facility] has arranged 24/7 CNA support for the patient in her apartment, as well. Patient will need PTAR home when stable.   CSW contacted [facility] to discuss patient's need to switch from palliative to hospice care. [facility] will follow up with daughter to initiate hospice. CSW to follow.            Expected Discharge Plan: Home w Hospice Care Barriers to Discharge: Continued Medical Work up   Patient Goals and CMS Choice Patient states their goals for this hospitalization and ongoing recovery are:: patient unable to participate in goal setting, not oriented CMS Medicare.gov Compare Post Acute Care list provided to:: Patient Represenative (must comment) Choice offered to / list presented to : Adult Children [place] ownership interest in [facility].provided to:: Adult Children    Expected Discharge Plan and Services     Post Acute Care Choice: Hospice Living arrangements for the past 2 months: Independent Living Facility, Skilled Nursing Facility                                      Prior Living Arrangements/Services Living arrangements for the past 2 months: Marketing executive, Skilled Nursing Facility Lives with:: Facility Resident Patient language and need for interpreter reviewed:: No Do you feel safe going back to the place where you live?: Yes       Need for Family Participation in Patient Care: Yes (Comment) Care giver support system in place?: Yes (comment)   Criminal Activity/Legal Involvement Pertinent to Current Situation/Hospitalization: No - Comment as needed  Activities of Daily Living Home Assistive Devices/Equipment: None ADL Screening (condition at time of admission) Patient's cognitive ability adequate to safely complete daily activities?: No Is the patient deaf or have difficulty hearing?: No Does the patient have difficulty seeing, even when wearing glasses/contacts?: No Does the patient have difficulty concentrating, remembering, or making decisions?: Yes Patient able to express need for assistance with ADLs?: No Does the patient have difficulty dressing or bathing?: Yes Independently performs ADLs?: No Communication: Independent Dressing (OT): Needs assistance Is this a change from baseline?: Pre-admission baseline Grooming: Needs assistance Is this a change from baseline?: Pre-admission baseline Feeding: Needs assistance Bathing: Needs assistance Toileting: Needs assistance In/Out Bed: Dependent Is this a change from baseline?: Pre-admission baseline Does the patient have difficulty walking or climbing stairs?: Yes Weakness of Legs: Both Weakness of Arms/Hands: Left  Permission Sought/Granted Permission sought to share information with : Facility Medical sales representative, Family Supports Permission granted to share information with : Yes, Verbal Permission Granted  Share Information with NAME: Serina Cowper  Permission granted to share info w AGENCY: [facility], Physicist, medical  Permission granted to share info w Relationship: Daughter     Emotional Assessment   Attitude/Demeanor/Rapport: Unable to Assess Affect (typically observed): Unable to Assess Orientation: : Oriented to  Self Alcohol / Substance Use: Not Applicable Psych Involvement: No (comment)  Admission diagnosis:  Seizure [R56.9] Patient Active  Problem List   Diagnosis Date Noted   Malnutrition of moderate degree 08/26/2022   Seizure 08/26/2022   Hypomagnesemia 08/26/2022   Depression 08/26/2022   GERD (gastroesophageal reflux disease) 08/26/2022   Partial seizure 08/23/2022   DM2 (diabetes mellitus, type 2) 08/23/2022   Magnesium deficiency 08/23/2022   Failure to thrive in adult 08/23/2022   Secondary hypercoagulable state 08/23/2022   Right middle cerebral artery stroke 05/05/2022   Ischemic stroke 05/01/2022   Vulvar irritation 04/22/2022   Lower abdominal pain 08/14/2021   Pain in left upper arm 08/14/2021   Dystrophic nail 08/14/2021   Urinary frequency 02/16/2021   Back pain 11/14/2020   Compression fx, lumbar spine, sequela 11/13/2020   Lumbar spinal stenosis 11/08/2020   Traumatic hemorrhage of cerebrum 07/31/2020   History of recent fall 07/30/2020   Intraparenchymal hemorrhage of brain 07/13/2020   Syncope and collapse 07/13/2020   Hypokalemia 07/13/2020   Atrial fibrillation, chronic 07/13/2020   DNR (do not resuscitate) 07/13/2020   S/P placement of cardiac pacemaker 07/03/2020   Myalgia due to statin 07/03/2020   Aortic atherosclerosis 07/03/2020   Persistent atrial fibrillation 08/12/2019   Fracture of neck of femur 06/27/2019   Displaced intertrochanteric fracture of left femur, sequela 06/25/2019   Age-related osteoporosis with current pathological fracture with routine healing 06/15/2019   Educated about COVID-19 virus infection 10/12/2018   Dysuria 02/02/2018   Edema of lower extremity 11/24/2017   Venous insufficiency of both lower extremities 11/24/2016   Essential hypertension 11/11/2016   Peripheral artery disease 11/11/2016   Chronic pulmonary hypertension 10/20/2016   Leg swelling 06/17/2016   Type 2 diabetes mellitus with diabetic neuropathy, unspecified 06/17/2016   Urinary incontinence, urge 12/27/2015   Atrophic vaginitis 09/26/2015   Severe tricuspid valve insufficiency 07/04/2015    Carpal tunnel syndrome of right wrist 05/29/2015   Chronic fatigue 05/01/2015   E. coli UTI 11/21/2014   Urethral caruncle 11/21/2014   Acute cystitis 10/26/2014   Mitral valve regurgitation 05/09/2014   Malaise and fatigue 03/09/2014   SSS (sick sinus syndrome) 10/11/2013   CAD (coronary artery disease) 10/11/2013   Osteoarthritis 03/08/2012   Screening for breast cancer 03/08/2012   Pernicious anemia 02/04/2012   History of colectomy 02/04/2012   Vitamin D deficiency 07/30/2011   Acquired thrombophilia (HCC) 06/13/2011   Urinary tract infection 06/13/2011   Permanent atrial fibrillation 06/13/2011   Coronary atherosclerosis due to calcified coronary lesion 06/13/2011   Chronic diarrhea 06/13/2011   PCP:  Sherlene Shams, MD Pharmacy:   [facility] - [place], [place] - [address] [place] [address] Phone: [phone] Fax: [phone]  [facility] [id number] [place], [place] - [address] [place] [address] Phone: [phone] Fax: [phone]  [facility] [address] [place] [address] Phone: [phone] Fax: [phone]  [facility] [place], [place] - [address] [place] [address] Phone: [phone] Fax: [phone]     Social Determinants of Health (SDOH) Social History: SDOH Screenings   Food Insecurity: No Food Insecurity (08/26/2022)  Housing: Low Risk  (08/26/2022)  Transportation Needs: No Transportation Needs (08/26/2022)  Utilities: Not At Risk (08/26/2022)  Depression (PHQ2-9): Low Risk  (04/22/2022)  Financial Resource Strain: Low Risk  (03/12/2022)  Physical Activity: Insufficiently  Active (01/10/2022)  Social Connections: Moderately Isolated (03/12/2022)  Stress: No Stress Concern Present (03/12/2022)  Tobacco Use: Medium Risk (08/26/2022)   SDOH Interventions:     Readmission Risk Interventions     No data  to display

## 2022-08-29 NOTE — Progress Notes (Signed)
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 Transferred from [facility] to [facility] for continuous EEG. PMH: A fib, CVA w/ L sided deficits (12/23), DM2, PPM, depression  Clinical Impression  Pt was seen for progression of mobility on side of bed with pt declining to open her eyes and was struggling to keep her sitting upright.  Pt is demonstrating a lot of challenge but has not been OOB in a length of time.  Her plan is to pursue mobility as much as tolerated to recover her ability to walk if possible and to increase quality of life and self efficacy.  Her family is making care decisions for her and will make final choices for level of rehab that is being provided to pt.  Follow acutely for goals of PT as are outlined on POC.       Recommendations for follow up therapy are one component of a multi-disciplinary discharge planning process, led by the attending physician.  Recommendations may be updated based on patient status, additional functional criteria and insurance authorization.  Follow Up Recommendations Can patient physically be transported by private vehicle: No     Assistance Recommended at Discharge Frequent or constant Supervision/Assistance  Patient can return home with the following  Two people to help with walking and/or transfers;Two people to help with bathing/dressing/bathroom;Help with stairs or ramp for entrance;Assist for transportation;Assistance with cooking/housework    Equipment Recommendations None recommended by PT  Recommendations for Other Services       Functional Status Assessment       Precautions / Restrictions Precautions Precautions: Fall Precaution Comments: L hemiparesis, L inattention Restrictions Weight Bearing  Restrictions: No Other Position/Activity Restrictions: pt is more stable sitting with UE support on pillows      Mobility  Bed Mobility Overal bed mobility: Needs Assistance Bed Mobility: Supine to Sit, Sit to Supine     Supine to sit: Max assist, +2 for physical assistance, +2 for safety/equipment Sit to supine: Max assist, +2 for physical assistance, +2 for safety/equipment   General bed mobility comments: pt is generally not assisting to side of bed, but also actively resisting to sit upright with strong posterior push    Transfers Overall transfer level: Needs assistance                 General transfer comment: declines to attempt with PT    Ambulation/Gait               General Gait Details: unable yet  Stairs            Wheelchair Mobility    Modified Rankin (Stroke Patients Only)       Balance Overall balance assessment: Needs assistance Sitting-balance support: Feet supported, Bilateral upper extremity supported Sitting balance-Leahy Scale: Poor Sitting balance - Comments: RUE push and posterior push which can be mitigated with pillows under UE's at times Postural control: Left lateral lean, Posterior lean                                   Pertinent Vitals/Pain Pain Assessment Pain Assessment: Faces Faces Pain Scale: Hurts little more Pain Location: bottom/low back Pain Descriptors / Indicators: Grimacing, Guarding, Aching Pain Intervention(s): Monitored during session, Repositioned,  Premedicated before session, Limited activity within patient's tolerance    Home Living                          Prior Function                       Hand Dominance        Extremity/Trunk Assessment                Communication      Cognition Arousal/Alertness: Lethargic Behavior During Therapy: Flat affect Overall Cognitive Status: Impaired/Different from baseline Area of Impairment: Attention, Memory,  Following commands, Safety/judgement, Awareness                 Orientation Level: Place, Time, Situation Current Attention Level: Selective Memory: Decreased short-term memory, Decreased recall of precautions Following Commands: Follows one step commands with increased time Safety/Judgement: Decreased awareness of safety, Decreased awareness of deficits Awareness: Intellectual Problem Solving: Slow processing, Requires verbal cues, Requires tactile cues General Comments: Pt refuses to open her eyes, refusing to take some meds, declining to stay up long        General Comments General comments (skin integrity, edema, etc.): Pt is up to side of bed with heavy assist of PT and nursing, re sisting sitting with push on RUE or posterior lean, refused to open eyes and refused some meds.  Repositioned on bed with head elevated and blanket cover for cold complaints    Exercises General Exercises - Lower Extremity Long Arc Quad: AAROM Heel Slides: AAROM   Assessment/Plan    PT Assessment    PT Problem List         PT Treatment Interventions      PT Goals (Current goals can be found in the Care Plan section)       Frequency Min 2X/week     Co-evaluation               AM-PAC PT "6 Clicks" Mobility  Outcome Measure Help needed turning from your back to your side while in a flat bed without using bedrails?: A Lot Help needed moving from lying on your back to sitting on the side of a flat bed without using bedrails?: Total Help needed moving to and from a bed to a chair (including a wheelchair)?: Total Help needed standing up from a chair using your arms (e.g., wheelchair or bedside chair)?: Total Help needed to walk in hospital room?: Total Help needed climbing 3-5 steps with a railing? : Total 6 Click Score: 7    End of Session   Activity Tolerance: Patient limited by fatigue;Treatment limited secondary to agitation Patient left: in bed;with call bell/phone within  reach;with bed alarm set;with family/visitor present Nurse Communication: Mobility status PT Visit Diagnosis: Unsteadiness on feet (R26.81);Muscle weakness (generalized) (M62.81)    Time: 1601-0932 PT Time Calculation (min) (ACUTE ONLY): 24 min   Charges:   PT Evaluation $PT Eval Moderate Complexity: 1 Mod PT Treatments $Therapeutic Activity: 8-22 mins       Ivar Drape 08/29/2022, 3:37 PM  Samul Dada, PT PhD Acute Rehab Dept. Number: [facility] [id number] and [facility] [phone]

## 2022-08-29 NOTE — Progress Notes (Signed)
Palliative Medicine Progress Note   Patient Name: Kara [name]       Date: 08/29/2022 DOB: Oct 12, 1922  Age: 89 y.o. MRN#: [id number] Attending Physician: Lonia Blood, MD Primary Care Physician: Sherlene Shams, MD Admit Date: 08/26/2022  Reason for Consultation/Follow-up: {Reason for Consult:23484}  HPI/Patient Profile: 89 y.o. female  with past medical history of atrial fibrillation, sick sinus syndrome s/p pacemaker placement, type 2 diabetes mellitus, hypertension, depression, and CVA in December 2023 with residual left-sided weakness. She presented to [facility] on 08/23/22 with possible seizure activity involving the face as well as left upper and lower extremities.  Transferred to [facility] on 08/26/2022 for continuous EEG.    Palliative Medicine has been consulted for goals of care.  Subjective: ***  Objective:  Physical Exam          Vital Signs: BP 139/64 (BP Location: Right Arm)   Pulse 69   Temp 98.5 F (36.9 C) (Oral)   Resp 18   Ht 5\' 2"  (1.575 m)   Wt 59.5 kg   SpO2 94%   BMI 23.99 kg/m  SpO2: SpO2: 94 % O2 Device: O2 Device: Room Air O2 Flow Rate:    Intake/output summary:  Intake/Output Summary (Last 24 hours) at 08/29/2022 1252 Last data filed at 08/29/2022 0645 Gross per 24 hour  Intake 120 ml  Output 500 ml  Net -380 ml    LBM: Last BM Date : 08/28/22     Palliative Assessment/Data: ***     Palliative Medicine Assessment & Plan   Assessment: Principal Problem:   Seizure Active Problems:   Permanent atrial fibrillation   Acute cystitis   Essential hypertension   Hypokalemia   DM2 (diabetes mellitus, type 2)   Hypomagnesemia   Depression   GERD (gastroesophageal reflux disease)    Recommendations/Plan: ***  Goals of Care and Additional  Recommendations: Limitations on Scope of Treatment: {Recommended Scope and Preferences:21019}  Code Status:   Prognosis:  {Palliative Care Prognosis:23504}  Discharge Planning: {Palliative dispostion:23505}  Care plan was discussed with ***  Thank you for allowing the Palliative Medicine Team to assist in the care of this patient.   ***   Merry Proud, NP   Please contact Palliative Medicine Team phone at [phone] for questions and concerns.  For individual providers, please see AMION.

## 2022-08-30 DIAGNOSIS — Z66 Do not resuscitate: Secondary | ICD-10-CM

## 2022-08-30 DIAGNOSIS — R627 Adult failure to thrive: Secondary | ICD-10-CM

## 2022-08-30 DIAGNOSIS — R569 Unspecified convulsions: Secondary | ICD-10-CM | POA: Diagnosis not present

## 2022-08-30 LAB — GLUCOSE, CAPILLARY: Glucose-Capillary: 131 mg/dL — ABNORMAL HIGH (ref 70–99)

## 2022-08-30 MED ORDER — METOPROLOL TARTRATE 25 MG PO TABS: 12.5000 mg | ORAL_TABLET | Freq: Two times a day (BID) | ORAL | 2 refills | Status: DC

## 2022-08-30 MED ORDER — BETHANECHOL CHLORIDE 10 MG PO TABS: 10.0000 mg | ORAL_TABLET | Freq: Three times a day (TID) | ORAL | 0 refills | Status: AC

## 2022-08-30 MED ORDER — ACETAMINOPHEN 325 MG PO TABS: 650.0000 mg | ORAL_TABLET | ORAL | Status: DC | PRN

## 2022-08-30 MED ORDER — APIXABAN 2.5 MG PO TABS: 2.5000 mg | ORAL_TABLET | Freq: Two times a day (BID) | ORAL | 2 refills | Status: DC

## 2022-08-30 MED ORDER — MELATONIN 3 MG PO TABS: 3.0000 mg | ORAL_TABLET | Freq: Every day | ORAL | 0 refills | Status: DC

## 2022-08-30 MED ORDER — LOPERAMIDE HCL 2 MG PO CAPS: 2.0000 mg | ORAL_CAPSULE | ORAL | 0 refills | Status: DC | PRN

## 2022-08-30 MED ORDER — CLONAZEPAM 0.5 MG PO TBDP: 0.5000 mg | ORAL_TABLET | ORAL | 0 refills | Status: DC | PRN

## 2022-08-30 MED ORDER — OXCARBAZEPINE 150 MG PO TABS: 150.0000 mg | ORAL_TABLET | Freq: Two times a day (BID) | ORAL | 2 refills | Status: DC

## 2022-08-30 MED ORDER — POTASSIUM CHLORIDE 20 MEQ PO PACK: 20.0000 meq | PACK | Freq: Every day | ORAL | 2 refills | Status: DC

## 2022-08-30 NOTE — Plan of Care (Signed)
  Problem: Education: Goal: Knowledge of General Education information will improve Description: Including pain rating scale, medication(s)/side effects and non-pharmacologic comfort measures Outcome: Adequate for Discharge   Problem: Health Behavior/Discharge Planning: Goal: Ability to manage health-related needs will improve Outcome: Adequate for Discharge   Problem: Clinical Measurements: Goal: Ability to maintain clinical measurements within normal limits will improve Outcome: Adequate for Discharge Goal: Will remain free from infection Outcome: Adequate for Discharge Goal: Diagnostic test results will improve Outcome: Adequate for Discharge Goal: Respiratory complications will improve Outcome: Adequate for Discharge Goal: Cardiovascular complication will be avoided Outcome: Adequate for Discharge   Problem: Activity: Goal: Risk for activity intolerance will decrease Outcome: Adequate for Discharge   Problem: Nutrition: Goal: Adequate nutrition will be maintained Outcome: Adequate for Discharge   Problem: Coping: Goal: Level of anxiety will decrease Outcome: Adequate for Discharge   Problem: Elimination: Goal: Will not experience complications related to bowel motility Outcome: Adequate for Discharge Goal: Will not experience complications related to urinary retention Outcome: Adequate for Discharge   Problem: Pain Managment: Goal: General experience of comfort will improve Outcome: Adequate for Discharge   Problem: Safety: Goal: Ability to remain free from injury will improve Outcome: Adequate for Discharge   Problem: Skin Integrity: Goal: Risk for impaired skin integrity will decrease Outcome: Adequate for Discharge   Problem: Education: Goal: Ability to describe self-care measures that may prevent or decrease complications (Diabetes Survival Skills Education) will improve Outcome: Adequate for Discharge Goal: Individualized Educational Video(s) Outcome:  Adequate for Discharge   Problem: Coping: Goal: Ability to adjust to condition or change in health will improve Outcome: Adequate for Discharge   Problem: Fluid Volume: Goal: Ability to maintain a balanced intake and output will improve Outcome: Adequate for Discharge   Problem: Health Behavior/Discharge Planning: Goal: Ability to identify and utilize available resources and services will improve Outcome: Adequate for Discharge Goal: Ability to manage health-related needs will improve Outcome: Adequate for Discharge   Problem: Metabolic: Goal: Ability to maintain appropriate glucose levels will improve Outcome: Adequate for Discharge   Problem: Nutritional: Goal: Maintenance of adequate nutrition will improve Outcome: Adequate for Discharge Goal: Progress toward achieving an optimal weight will improve Outcome: Adequate for Discharge   Problem: Skin Integrity: Goal: Risk for impaired skin integrity will decrease Outcome: Adequate for Discharge   Problem: Tissue Perfusion: Goal: Adequacy of tissue perfusion will improve Outcome: Adequate for Discharge   Problem: Acute Rehab OT Goals (only OT should resolve) Goal: Pt. Will Perform Eating Outcome: Adequate for Discharge Goal: Pt. Will Perform Grooming Outcome: Adequate for Discharge Goal: OT Additional ADL Goal #1 Outcome: Adequate for Discharge Goal: OT Additional ADL Goal #2 Outcome: Adequate for Discharge Goal: OT Additional ADL Goal #3 Outcome: Adequate for Discharge   Problem: Acute Rehab PT Goals(only PT should resolve) Goal: Pt Will Go Supine/Side To Sit Outcome: Adequate for Discharge Goal: Patient Will Perform Sitting Balance Outcome: Adequate for Discharge Goal: Patient Will Transfer Sit To/From Stand Outcome: Adequate for Discharge Goal: Pt Will Transfer Bed To Chair/Chair To Bed Outcome: Adequate for Discharge Goal: Pt Will Perform Standing Balance Or Pre-Gait Outcome: Adequate for Discharge Goal: Pt  Will Ambulate Outcome: Adequate for Discharge

## 2022-08-30 NOTE — Discharge Summary (Signed)
DISCHARGE SUMMARY  Kara [name]  MR#: [id number]  DOB:Dec 17, 1922  Date of Admission: 08/26/2022 Date of Discharge: 08/30/2022  Attending Physician:Perpetua Elling Silvestre Gunner, MD  Patient's GQB:VQXIH, Mar Daring, MD  Consults: Neurology Palliative Care  Disposition: Discharged to ILF for hospice care  Follow-up Appts:  Follow-up Information     Sherlene Shams, MD Follow up.   Specialty: Internal Medicine Contact information: [address] [place] [address] [phone]                 Discharge Diagnoses: New onset seizure activity Acute cystitis POA Recurring urinary retention Hypokalemia Hypomagnesemia Permanent atrial fibrillation DM2 HTN Depression NO CODE BLUE - DNR   Initial presentation: 89 year old with a history of permanent atrial fibrillation, SSS status post pacemaker placement, DM2, ischemic stroke December 2023 with residual left-sided paresis, HTN, and depression who was transferred to [facility] from [facility]/9/24 after she initially presented to that campus with seizure activity exhibited as left sided facial and extremity twitching. She was initially transported to [facility] via EMS with this seizure activity, and the activity ceased when treated with Versed. During her course at [facility] Neurology was consulted, and she was started on Keppra 500 mg twice daily as well as clonazepam nightly. It was then felt that she needed to undergo long-term EEG monitoring and therefore she was transferred to [facility].   Hospital Course:  Goals of Care Goal is to focus on comfort and quality of life - every effort should be made to avoid repeat hospitalizations - patient to be followed by Hospice in the outpt setting   New onset seizure activity Felt to be related to ischemic CVA December 2023 with encephalomalacia superimposed on diffuse brain atrophy in the setting of chronic ischemic change, and likely further exacerbated by presence of UTI - long-term  EEG monitoring confirmed resolution of seizures and was discontinued 08/27/22 - antiepileptic drug titration per Neurology with change to Oxcarb due to concomitant depression   Acute cystitis POA Diagnosed as an outpatient 08/22/2022 - initially refused antibiotics - Rocephin initiated during hospitalization at [facility] - pt completed 3+ days of abx tx - culture at [facility] revealed E. coli which was susceptible to Rocephin (but was notably resistant to Cipro) - no clinical evidence of persisting UTI at time of d/c    Recurring urinary retention Likely related to bedridden status with very limited to almost no mobility - responded favorably to urecholine trial so will continue for an additional 5 days post d/c - monitor for recurrent retention as oupt after urecholine is stopped    Hypokalemia Corrected with supplementation - monitored intermittently - pt despises oral KCl and has extreme difficulty taking it - long term monitoring not felt to be of significant benefit for this patient given the clinical situation - continue low dose empiric replacement    Hypomagnesemia Corrected with supplementation - long term monitoring not felt to be of significant benefit for this patient given the clinical situation    Permanent atrial fibrillation CHA2DS2-VASc is 8 - on chronic Eliquis - continue metoprolol - rate controlled   DM2 Does not require medical therapy in the outpatient setting - CBG reasonably well-controlled   HTN Blood pressure well-controlled at present   Depression Continue usual Celexa dose    Allergies as of 08/30/2022       Reactions   Baycol [cerivastatin Sodium]    Cardizem [diltiazem Hcl]    LOWER EXTREMITY EDEMA   Crestor [rosuvastatin Calcium] Other (  See Comments)   INTOLERANT   Cymbalta [duloxetine Hcl] Nausea Only   Dronedarone Other (See Comments)   Fatigue   Metoprolol Tartrate Other (See Comments)   Omeprazole Other (See Comments)   Pravachol    INTOLERANT   Statins  Other (See Comments)   Vioxx [rofecoxib]    Zocor [simvastatin]    INTOLERANT        Medication List     STOP taking these medications    cefTRIAXone 1 g in sodium chloride 0.9 % 100 mL   citalopram 10 MG tablet Commonly known as: CELEXA   estradiol 0.1 MG/GM vaginal cream Commonly known as: ESTRACE   levETIRAcetam 500 MG 24 hr tablet Commonly known as: KEPPRA XR   mirtazapine 7.5 MG tablet Commonly known as: REMERON   multivitamin with minerals Tabs tablet   pantoprazole 40 MG tablet Commonly known as: PROTONIX       TAKE these medications    acetaminophen 325 MG tablet Commonly known as: TYLENOL Take 2 tablets (650 mg total) by mouth every 4 (four) hours as needed for mild pain (or temp > 37.5 C (99.5 F)).   apixaban 2.5 MG Tabs tablet Commonly known as: ELIQUIS Take 1 tablet (2.5 mg total) by mouth 2 (two) times daily.   bethanechol 10 MG tablet Commonly known as: URECHOLINE Take 1 tablet (10 mg total) by mouth 3 (three) times daily for 5 days.   clonazePAM 0.5 MG disintegrating tablet Commonly known as: KLONOPIN Take 1 tablet (0.5 mg total) by mouth as needed for seizure (seizure lasting over 2 minutes). What changed:  when to take this reasons to take this   loperamide 2 MG capsule Commonly known as: IMODIUM Take 1 capsule (2 mg total) by mouth as needed for diarrhea or loose stools.   melatonin 3 MG Tabs tablet Take 1 tablet (3 mg total) by mouth at bedtime.   metoprolol tartrate 25 MG tablet Commonly known as: LOPRESSOR Take 0.5 tablets (12.5 mg total) by mouth 2 (two) times daily.   OXcarbazepine 150 MG tablet Commonly known as: TRILEPTAL Take 1 tablet (150 mg total) by mouth 2 (two) times daily.   potassium chloride 20 MEQ packet Commonly known as: KLOR-CON Take 20 mEq by mouth daily.        Day of Discharge BP (!) 159/92 (BP Location: Right Arm)   Pulse 83   Temp 97.7 F (36.5 C) (Oral)   Resp 17   Ht  (1.575 m)   Wt  59.5 kg   SpO2 91%   BMI 23.99 kg/m   Physical Exam: General: No acute respiratory distress Lungs: Clear to auscultation bilaterally without wheezes or crackles Cardiovascular: Regular rate and rhythm without murmur gallop or rub normal S1 and S2 Abdomen: Nontender, nondistended, soft, bowel sounds positive, no rebound, no ascites, no appreciable mass Extremities: No significant cyanosis, clubbing, or edema bilateral lower extremities  Basic Metabolic Panel: Recent Labs  Lab 08/24/22 0609 08/24/22 1342 08/25/22 0401 08/26/22 0427 08/27/22 0442 08/28/22 0408 08/29/22 0708  NA 137  --  140 141 139 139 136  K 2.6*  --  3.2* 3.2* 3.9 3.4* 3.3*  CL 99  --  106 104 103 102 99  CO2 29  --  GLUCOSE 106*  --  120* 110* 133* 140* 130*  BUN 11  --  8 6* 7* 6* 7*  CREATININE 0.59  --  0.63 0.57 0.68 0.72 0.69  CALCIUM 7.6*  --  7.5* 7.8* 8.2* 8.3* 8.2*  MG 1.9  --  1.8 1.7 1.7 2.0 1.7  PHOS 3.3 3.0  --  2.6  --   --   --    CBC: Recent Labs  Lab 08/23/22 1957 08/26/22 0427 08/27/22 0442 08/29/22 0708  WBC 5.3 5.3 5.5 5.9  NEUTROABS 3.2  --  2.5  --   HGB 12.4 11.4* 12.2 13.5  HCT 37.3 35.5* 36.8 40.8  MCV 93.0 96.2 96.8 95.3  PLT 206 165 168 154    Time spent in discharge (includes decision making & examination of pt): 35 minutes  08/30/2022, 11:23 AM   Lonia Blood, MD [facility] Office  [phone]

## 2022-08-30 NOTE — Progress Notes (Signed)
Palliative Medicine Progress Note   Patient Name: Kara [name]       Date: 08/30/2022 DOB: 10-Oct-1922  Age: 89 y.o. MRN#: [id number] Attending Physician: Lonia Blood, MD Primary Care Physician: Sherlene Shams, MD Admit Date: 08/26/2022   HPI/Patient Profile: 89 y.o. female  with past medical history of atrial fibrillation, sick sinus syndrome s/p pacemaker placement, type 2 diabetes mellitus, hypertension, depression, and CVA in December 2023 with residual left-sided weakness. She presented to [facility] on 08/23/22 with possible seizure activity involving the face as well as left upper and lower extremities.  Transferred to [facility] on 08/26/2022 for continuous EEG.    Palliative Medicine has been consulted for goals of care.  Subjective: Chart reviewed. Regarding urinary retention, patient has responded well to urecholine and been able to void.   Bedside visit. Patient remains drowsy but with no acute complaints. Discussed with daughter plan for discharge back to [facility] with hospice [facility] [facility]) and 24/7 private care. All questions/concerns addressed.    Objective:  Physical Exam Vitals reviewed.  Constitutional:      General: She is not in acute distress.    Appearance: She is ill-appearing.  Pulmonary:     Effort: Pulmonary effort is normal.  Neurological:     Mental Status: She is lethargic.     Motor: Weakness present.             Vital Signs: BP (!) 123/48 (BP Location: Right Arm)   Pulse 66   Temp (!) 97.5 F (36.4 C) (Oral)   Resp 17   Ht  (1.575 m)   Wt 59.5 kg   SpO2 93%   BMI 23.99 kg/m  SpO2: SpO2: 93 % O2 Device: O2 Device: Room Air O2 Flow Rate:     Palliative Medicine Assessment & Plan   Assessment: Principal Problem:   Seizure Active Problems:    Permanent atrial fibrillation   Acute cystitis   Essential hypertension   Hypokalemia   DM2 (diabetes mellitus, type 2)   Hypomagnesemia   Depression   GERD (gastroesophageal reflux disease)    Recommendations/Plan: Goal of care is to focus on comfort and quality of life Pending discharge today back to [facility] with hospice ([facility]) and 24/7 private care  Goals of Care and Additional Recommendations: Limitations on Scope of Treatment: Avoid Hospitalization  Code Status: DNR/DNI  Prognosis:  < 6 months    Thank you for allowing the Palliative Medicine Team to assist in the care of this patient.   MDM - moderate   Merry Proud, NP   Please contact Palliative Medicine Team phone at [phone] for questions and concerns.  For individual providers, please see AMION.

## 2022-08-30 NOTE — TOC Transition Note (Addendum)
Transition of Care [facility]) - CM/SW Discharge Note   Patient Details  Name: Kara [name] MRN: [id number] Date of Birth: 1922/08/20  Transition of Care [facility]) CM/SW Contact:  Lawerance Sabal, RN Phone Number: 08/30/2022, 12:36 PM   Clinical Narrative:     Sherron Monday w patient's daughter Serina Cowper over the phone. She states that she is ready for patient to DC to [facility] with hospice through [facility]  Spoke w Traci RN ACC to notify of DC. Confirmed address with [name], and called PTAR. PTAR forms given to unit clerk Confirmed w bedside nurse that she has DNR on chart.   Gwyneth Revels (Daughter) [phone]  Final next level of care: Home w Hospice Care Barriers to Discharge: No Barriers Identified   Patient Goals and CMS Choice CMS Medicare.gov Compare Post Acute Care list provided to:: Patient Represenative (must comment) Choice offered to / list presented to : Adult Children  Discharge Placement                         Discharge Plan and Services Additional resources added to the After Visit Summary for       Post Acute Care Choice: Hospice                               Social Determinants of Health (SDOH) Interventions SDOH Screenings   Food Insecurity: No Food Insecurity (08/26/2022)  Housing: Low Risk  (08/26/2022)  Transportation Needs: No Transportation Needs (08/26/2022)  Utilities: Not At Risk (08/26/2022)  Depression (PHQ2-9): Low Risk  (04/22/2022)  Financial Resource Strain: Low Risk  (03/12/2022)  Physical Activity: Insufficiently Active (01/10/2022)  Social Connections: Moderately Isolated (03/12/2022)  Stress: No Stress Concern Present (03/12/2022)  Tobacco Use: Medium Risk (08/26/2022)     Readmission Risk Interventions     No data to display

## 2022-08-30 NOTE — Plan of Care (Signed)
  Problem: Activity: Goal: Risk for activity intolerance will decrease Outcome: Progressing   Problem: Nutrition: Goal: Adequate nutrition will be maintained Outcome: Progressing   Problem: Elimination: Goal: Will not experience complications related to urinary retention Outcome: Progressing   Problem: Pain Managment: Goal: General experience of comfort will improve Outcome: Progressing   Problem: Skin Integrity: Goal: Risk for impaired skin integrity will decrease Outcome: Progressing   

## 2022-08-30 NOTE — Progress Notes (Signed)
Patient d/c home with hospice services following, discharge instructions, RX's and follow up appts explained and provided to patients daughter verbalized understanding. Patient transported home by PTAR services family at bedside at d/c.  Yostin Malacara, Kae Heller, RN

## 2022-09-01 DIAGNOSIS — G40909 Epilepsy, unspecified, not intractable, without status epilepticus: Secondary | ICD-10-CM | POA: Diagnosis not present

## 2022-09-01 DIAGNOSIS — I4891 Unspecified atrial fibrillation: Secondary | ICD-10-CM | POA: Diagnosis not present

## 2022-09-01 DIAGNOSIS — I2510 Atherosclerotic heart disease of native coronary artery without angina pectoris: Secondary | ICD-10-CM | POA: Diagnosis not present

## 2022-09-01 DIAGNOSIS — I739 Peripheral vascular disease, unspecified: Secondary | ICD-10-CM | POA: Diagnosis not present

## 2022-09-01 DIAGNOSIS — Z950 Presence of cardiac pacemaker: Secondary | ICD-10-CM | POA: Diagnosis not present

## 2022-09-01 DIAGNOSIS — F0153 Vascular dementia, unspecified severity, with mood disturbance: Secondary | ICD-10-CM | POA: Diagnosis not present

## 2022-09-01 DIAGNOSIS — Z87440 Personal history of urinary (tract) infections: Secondary | ICD-10-CM | POA: Diagnosis not present

## 2022-09-01 DIAGNOSIS — I69818 Other symptoms and signs involving cognitive functions following other cerebrovascular disease: Secondary | ICD-10-CM | POA: Diagnosis not present

## 2022-09-01 DIAGNOSIS — R634 Abnormal weight loss: Secondary | ICD-10-CM | POA: Diagnosis not present

## 2022-09-01 DIAGNOSIS — I495 Sick sinus syndrome: Secondary | ICD-10-CM | POA: Diagnosis not present

## 2022-09-01 DIAGNOSIS — M1990 Unspecified osteoarthritis, unspecified site: Secondary | ICD-10-CM | POA: Diagnosis not present

## 2022-09-01 DIAGNOSIS — K219 Gastro-esophageal reflux disease without esophagitis: Secondary | ICD-10-CM | POA: Diagnosis not present

## 2022-09-01 DIAGNOSIS — I10 Essential (primary) hypertension: Secondary | ICD-10-CM | POA: Diagnosis not present

## 2022-09-01 DIAGNOSIS — F01511 Vascular dementia, unspecified severity, with agitation: Secondary | ICD-10-CM | POA: Diagnosis not present

## 2022-09-02 DIAGNOSIS — I4891 Unspecified atrial fibrillation: Secondary | ICD-10-CM | POA: Diagnosis not present

## 2022-09-02 DIAGNOSIS — R634 Abnormal weight loss: Secondary | ICD-10-CM | POA: Diagnosis not present

## 2022-09-02 DIAGNOSIS — F0153 Vascular dementia, unspecified severity, with mood disturbance: Secondary | ICD-10-CM | POA: Diagnosis not present

## 2022-09-02 DIAGNOSIS — G40909 Epilepsy, unspecified, not intractable, without status epilepticus: Secondary | ICD-10-CM | POA: Diagnosis not present

## 2022-09-02 DIAGNOSIS — I69818 Other symptoms and signs involving cognitive functions following other cerebrovascular disease: Secondary | ICD-10-CM | POA: Diagnosis not present

## 2022-09-02 DIAGNOSIS — F01511 Vascular dementia, unspecified severity, with agitation: Secondary | ICD-10-CM | POA: Diagnosis not present

## 2022-09-03 DIAGNOSIS — F01511 Vascular dementia, unspecified severity, with agitation: Secondary | ICD-10-CM | POA: Diagnosis not present

## 2022-09-03 DIAGNOSIS — F0153 Vascular dementia, unspecified severity, with mood disturbance: Secondary | ICD-10-CM | POA: Diagnosis not present

## 2022-09-03 DIAGNOSIS — R634 Abnormal weight loss: Secondary | ICD-10-CM | POA: Diagnosis not present

## 2022-09-03 DIAGNOSIS — G40909 Epilepsy, unspecified, not intractable, without status epilepticus: Secondary | ICD-10-CM | POA: Diagnosis not present

## 2022-09-03 DIAGNOSIS — I69818 Other symptoms and signs involving cognitive functions following other cerebrovascular disease: Secondary | ICD-10-CM | POA: Diagnosis not present

## 2022-09-03 DIAGNOSIS — I4891 Unspecified atrial fibrillation: Secondary | ICD-10-CM | POA: Diagnosis not present

## 2022-09-04 DIAGNOSIS — G40909 Epilepsy, unspecified, not intractable, without status epilepticus: Secondary | ICD-10-CM | POA: Diagnosis not present

## 2022-09-04 DIAGNOSIS — I4891 Unspecified atrial fibrillation: Secondary | ICD-10-CM | POA: Diagnosis not present

## 2022-09-04 DIAGNOSIS — R634 Abnormal weight loss: Secondary | ICD-10-CM | POA: Diagnosis not present

## 2022-09-04 DIAGNOSIS — F01511 Vascular dementia, unspecified severity, with agitation: Secondary | ICD-10-CM | POA: Diagnosis not present

## 2022-09-04 DIAGNOSIS — I69818 Other symptoms and signs involving cognitive functions following other cerebrovascular disease: Secondary | ICD-10-CM | POA: Diagnosis not present

## 2022-09-04 DIAGNOSIS — F0153 Vascular dementia, unspecified severity, with mood disturbance: Secondary | ICD-10-CM | POA: Diagnosis not present

## 2022-09-05 ENCOUNTER — Telehealth: Payer: Self-pay

## 2022-09-05 DIAGNOSIS — F0153 Vascular dementia, unspecified severity, with mood disturbance: Secondary | ICD-10-CM | POA: Diagnosis not present

## 2022-09-05 DIAGNOSIS — I69818 Other symptoms and signs involving cognitive functions following other cerebrovascular disease: Secondary | ICD-10-CM | POA: Diagnosis not present

## 2022-09-05 DIAGNOSIS — G40909 Epilepsy, unspecified, not intractable, without status epilepticus: Secondary | ICD-10-CM | POA: Diagnosis not present

## 2022-09-05 DIAGNOSIS — F01511 Vascular dementia, unspecified severity, with agitation: Secondary | ICD-10-CM | POA: Diagnosis not present

## 2022-09-05 DIAGNOSIS — I4891 Unspecified atrial fibrillation: Secondary | ICD-10-CM | POA: Diagnosis not present

## 2022-09-05 DIAGNOSIS — R634 Abnormal weight loss: Secondary | ICD-10-CM | POA: Diagnosis not present

## 2022-09-05 NOTE — Patient Outreach (Signed)
  Care Coordination   Follow Up Visit Note   09/05/2022 Name: OZA OBERLE MRN: [id number] DOB: May 09, 1923  Kara [name] is a 89 y.o. year old female who sees Darrick Huntsman, Mar Daring, MD for primary care.  Notification received from care management assistant that patient's daughter called to cancel appointment due to patient now having Hospice for care.  No further outreach needed from [facility] at this time.      Goals Addressed             This Visit's Progress    COMPLETED: Patient Stated: Manage my leg pain/ swelling and back pain       Per notification patient admitted to Hospice.            SDOH assessments and interventions completed:  No     Care Coordination Interventions:  No, not indicated   Follow up plan: No further intervention required.   Encounter Outcome:  Pt. Visit Completed   George Ina RN,BSN,CCM [facility] Care Coordination [phone] direct line

## 2022-09-09 DIAGNOSIS — F01511 Vascular dementia, unspecified severity, with agitation: Secondary | ICD-10-CM | POA: Diagnosis not present

## 2022-09-09 DIAGNOSIS — F0153 Vascular dementia, unspecified severity, with mood disturbance: Secondary | ICD-10-CM | POA: Diagnosis not present

## 2022-09-09 DIAGNOSIS — R634 Abnormal weight loss: Secondary | ICD-10-CM | POA: Diagnosis not present

## 2022-09-09 DIAGNOSIS — I69818 Other symptoms and signs involving cognitive functions following other cerebrovascular disease: Secondary | ICD-10-CM | POA: Diagnosis not present

## 2022-09-09 DIAGNOSIS — G40909 Epilepsy, unspecified, not intractable, without status epilepticus: Secondary | ICD-10-CM | POA: Diagnosis not present

## 2022-09-09 DIAGNOSIS — I4891 Unspecified atrial fibrillation: Secondary | ICD-10-CM | POA: Diagnosis not present

## 2022-09-11 DIAGNOSIS — F0153 Vascular dementia, unspecified severity, with mood disturbance: Secondary | ICD-10-CM | POA: Diagnosis not present

## 2022-09-11 DIAGNOSIS — G40909 Epilepsy, unspecified, not intractable, without status epilepticus: Secondary | ICD-10-CM | POA: Diagnosis not present

## 2022-09-11 DIAGNOSIS — R634 Abnormal weight loss: Secondary | ICD-10-CM | POA: Diagnosis not present

## 2022-09-11 DIAGNOSIS — F01511 Vascular dementia, unspecified severity, with agitation: Secondary | ICD-10-CM | POA: Diagnosis not present

## 2022-09-11 DIAGNOSIS — I4891 Unspecified atrial fibrillation: Secondary | ICD-10-CM | POA: Diagnosis not present

## 2022-09-11 DIAGNOSIS — I69818 Other symptoms and signs involving cognitive functions following other cerebrovascular disease: Secondary | ICD-10-CM | POA: Diagnosis not present

## 2022-09-12 DIAGNOSIS — G40909 Epilepsy, unspecified, not intractable, without status epilepticus: Secondary | ICD-10-CM | POA: Diagnosis not present

## 2022-09-12 DIAGNOSIS — F01511 Vascular dementia, unspecified severity, with agitation: Secondary | ICD-10-CM | POA: Diagnosis not present

## 2022-09-12 DIAGNOSIS — R634 Abnormal weight loss: Secondary | ICD-10-CM | POA: Diagnosis not present

## 2022-09-12 DIAGNOSIS — I69818 Other symptoms and signs involving cognitive functions following other cerebrovascular disease: Secondary | ICD-10-CM | POA: Diagnosis not present

## 2022-09-12 DIAGNOSIS — F0153 Vascular dementia, unspecified severity, with mood disturbance: Secondary | ICD-10-CM | POA: Diagnosis not present

## 2022-09-12 DIAGNOSIS — I4891 Unspecified atrial fibrillation: Secondary | ICD-10-CM | POA: Diagnosis not present

## 2022-09-16 DIAGNOSIS — G40909 Epilepsy, unspecified, not intractable, without status epilepticus: Secondary | ICD-10-CM | POA: Diagnosis not present

## 2022-09-16 DIAGNOSIS — I4891 Unspecified atrial fibrillation: Secondary | ICD-10-CM | POA: Diagnosis not present

## 2022-09-16 DIAGNOSIS — I69818 Other symptoms and signs involving cognitive functions following other cerebrovascular disease: Secondary | ICD-10-CM | POA: Diagnosis not present

## 2022-09-16 DIAGNOSIS — R634 Abnormal weight loss: Secondary | ICD-10-CM | POA: Diagnosis not present

## 2022-09-16 DIAGNOSIS — F0153 Vascular dementia, unspecified severity, with mood disturbance: Secondary | ICD-10-CM | POA: Diagnosis not present

## 2022-09-16 DIAGNOSIS — F01511 Vascular dementia, unspecified severity, with agitation: Secondary | ICD-10-CM | POA: Diagnosis not present

## 2022-09-17 DIAGNOSIS — Z87440 Personal history of urinary (tract) infections: Secondary | ICD-10-CM | POA: Diagnosis not present

## 2022-09-17 DIAGNOSIS — Z950 Presence of cardiac pacemaker: Secondary | ICD-10-CM | POA: Diagnosis not present

## 2022-09-17 DIAGNOSIS — I2510 Atherosclerotic heart disease of native coronary artery without angina pectoris: Secondary | ICD-10-CM | POA: Diagnosis not present

## 2022-09-17 DIAGNOSIS — F0153 Vascular dementia, unspecified severity, with mood disturbance: Secondary | ICD-10-CM | POA: Diagnosis not present

## 2022-09-17 DIAGNOSIS — F01511 Vascular dementia, unspecified severity, with agitation: Secondary | ICD-10-CM | POA: Diagnosis not present

## 2022-09-17 DIAGNOSIS — I4891 Unspecified atrial fibrillation: Secondary | ICD-10-CM | POA: Diagnosis not present

## 2022-09-17 DIAGNOSIS — I739 Peripheral vascular disease, unspecified: Secondary | ICD-10-CM | POA: Diagnosis not present

## 2022-09-17 DIAGNOSIS — I69818 Other symptoms and signs involving cognitive functions following other cerebrovascular disease: Secondary | ICD-10-CM | POA: Diagnosis not present

## 2022-09-17 DIAGNOSIS — G40909 Epilepsy, unspecified, not intractable, without status epilepticus: Secondary | ICD-10-CM | POA: Diagnosis not present

## 2022-09-17 DIAGNOSIS — R634 Abnormal weight loss: Secondary | ICD-10-CM | POA: Diagnosis not present

## 2022-09-17 DIAGNOSIS — I10 Essential (primary) hypertension: Secondary | ICD-10-CM | POA: Diagnosis not present

## 2022-09-17 DIAGNOSIS — M1990 Unspecified osteoarthritis, unspecified site: Secondary | ICD-10-CM | POA: Diagnosis not present

## 2022-09-17 DIAGNOSIS — I495 Sick sinus syndrome: Secondary | ICD-10-CM | POA: Diagnosis not present

## 2022-09-17 DIAGNOSIS — K219 Gastro-esophageal reflux disease without esophagitis: Secondary | ICD-10-CM | POA: Diagnosis not present

## 2022-09-18 DIAGNOSIS — I69818 Other symptoms and signs involving cognitive functions following other cerebrovascular disease: Secondary | ICD-10-CM | POA: Diagnosis not present

## 2022-09-18 DIAGNOSIS — G40909 Epilepsy, unspecified, not intractable, without status epilepticus: Secondary | ICD-10-CM | POA: Diagnosis not present

## 2022-09-18 DIAGNOSIS — F01511 Vascular dementia, unspecified severity, with agitation: Secondary | ICD-10-CM | POA: Diagnosis not present

## 2022-09-18 DIAGNOSIS — F0153 Vascular dementia, unspecified severity, with mood disturbance: Secondary | ICD-10-CM | POA: Diagnosis not present

## 2022-09-18 DIAGNOSIS — I4891 Unspecified atrial fibrillation: Secondary | ICD-10-CM | POA: Diagnosis not present

## 2022-09-18 DIAGNOSIS — R634 Abnormal weight loss: Secondary | ICD-10-CM | POA: Diagnosis not present

## 2022-09-19 DIAGNOSIS — R634 Abnormal weight loss: Secondary | ICD-10-CM | POA: Diagnosis not present

## 2022-09-19 DIAGNOSIS — I4891 Unspecified atrial fibrillation: Secondary | ICD-10-CM | POA: Diagnosis not present

## 2022-09-19 DIAGNOSIS — I69818 Other symptoms and signs involving cognitive functions following other cerebrovascular disease: Secondary | ICD-10-CM | POA: Diagnosis not present

## 2022-09-19 DIAGNOSIS — F0153 Vascular dementia, unspecified severity, with mood disturbance: Secondary | ICD-10-CM | POA: Diagnosis not present

## 2022-09-19 DIAGNOSIS — G40909 Epilepsy, unspecified, not intractable, without status epilepticus: Secondary | ICD-10-CM | POA: Diagnosis not present

## 2022-09-19 DIAGNOSIS — F01511 Vascular dementia, unspecified severity, with agitation: Secondary | ICD-10-CM | POA: Diagnosis not present

## 2022-09-23 DIAGNOSIS — G40909 Epilepsy, unspecified, not intractable, without status epilepticus: Secondary | ICD-10-CM | POA: Diagnosis not present

## 2022-09-23 DIAGNOSIS — R634 Abnormal weight loss: Secondary | ICD-10-CM | POA: Diagnosis not present

## 2022-09-23 DIAGNOSIS — F01511 Vascular dementia, unspecified severity, with agitation: Secondary | ICD-10-CM | POA: Diagnosis not present

## 2022-09-23 DIAGNOSIS — F0153 Vascular dementia, unspecified severity, with mood disturbance: Secondary | ICD-10-CM | POA: Diagnosis not present

## 2022-09-23 DIAGNOSIS — I69818 Other symptoms and signs involving cognitive functions following other cerebrovascular disease: Secondary | ICD-10-CM | POA: Diagnosis not present

## 2022-09-23 DIAGNOSIS — I4891 Unspecified atrial fibrillation: Secondary | ICD-10-CM | POA: Diagnosis not present

## 2022-09-25 DIAGNOSIS — G40909 Epilepsy, unspecified, not intractable, without status epilepticus: Secondary | ICD-10-CM | POA: Diagnosis not present

## 2022-09-25 DIAGNOSIS — F01511 Vascular dementia, unspecified severity, with agitation: Secondary | ICD-10-CM | POA: Diagnosis not present

## 2022-09-25 DIAGNOSIS — F0153 Vascular dementia, unspecified severity, with mood disturbance: Secondary | ICD-10-CM | POA: Diagnosis not present

## 2022-09-25 DIAGNOSIS — I69818 Other symptoms and signs involving cognitive functions following other cerebrovascular disease: Secondary | ICD-10-CM | POA: Diagnosis not present

## 2022-09-25 DIAGNOSIS — R634 Abnormal weight loss: Secondary | ICD-10-CM | POA: Diagnosis not present

## 2022-09-25 DIAGNOSIS — I4891 Unspecified atrial fibrillation: Secondary | ICD-10-CM | POA: Diagnosis not present

## 2022-09-30 DIAGNOSIS — I4891 Unspecified atrial fibrillation: Secondary | ICD-10-CM | POA: Diagnosis not present

## 2022-09-30 DIAGNOSIS — G40909 Epilepsy, unspecified, not intractable, without status epilepticus: Secondary | ICD-10-CM | POA: Diagnosis not present

## 2022-09-30 DIAGNOSIS — R634 Abnormal weight loss: Secondary | ICD-10-CM | POA: Diagnosis not present

## 2022-09-30 DIAGNOSIS — F0153 Vascular dementia, unspecified severity, with mood disturbance: Secondary | ICD-10-CM | POA: Diagnosis not present

## 2022-09-30 DIAGNOSIS — I69818 Other symptoms and signs involving cognitive functions following other cerebrovascular disease: Secondary | ICD-10-CM | POA: Diagnosis not present

## 2022-09-30 DIAGNOSIS — F01511 Vascular dementia, unspecified severity, with agitation: Secondary | ICD-10-CM | POA: Diagnosis not present

## 2022-10-01 DIAGNOSIS — R634 Abnormal weight loss: Secondary | ICD-10-CM | POA: Diagnosis not present

## 2022-10-01 DIAGNOSIS — F01511 Vascular dementia, unspecified severity, with agitation: Secondary | ICD-10-CM | POA: Diagnosis not present

## 2022-10-01 DIAGNOSIS — F0153 Vascular dementia, unspecified severity, with mood disturbance: Secondary | ICD-10-CM | POA: Diagnosis not present

## 2022-10-01 DIAGNOSIS — G40909 Epilepsy, unspecified, not intractable, without status epilepticus: Secondary | ICD-10-CM | POA: Diagnosis not present

## 2022-10-01 DIAGNOSIS — I69818 Other symptoms and signs involving cognitive functions following other cerebrovascular disease: Secondary | ICD-10-CM | POA: Diagnosis not present

## 2022-10-01 DIAGNOSIS — I4891 Unspecified atrial fibrillation: Secondary | ICD-10-CM | POA: Diagnosis not present

## 2022-10-02 DIAGNOSIS — G40909 Epilepsy, unspecified, not intractable, without status epilepticus: Secondary | ICD-10-CM | POA: Diagnosis not present

## 2022-10-02 DIAGNOSIS — I69818 Other symptoms and signs involving cognitive functions following other cerebrovascular disease: Secondary | ICD-10-CM | POA: Diagnosis not present

## 2022-10-02 DIAGNOSIS — F0153 Vascular dementia, unspecified severity, with mood disturbance: Secondary | ICD-10-CM | POA: Diagnosis not present

## 2022-10-02 DIAGNOSIS — R634 Abnormal weight loss: Secondary | ICD-10-CM | POA: Diagnosis not present

## 2022-10-02 DIAGNOSIS — F01511 Vascular dementia, unspecified severity, with agitation: Secondary | ICD-10-CM | POA: Diagnosis not present

## 2022-10-02 DIAGNOSIS — I4891 Unspecified atrial fibrillation: Secondary | ICD-10-CM | POA: Diagnosis not present

## 2022-10-07 DIAGNOSIS — F01511 Vascular dementia, unspecified severity, with agitation: Secondary | ICD-10-CM | POA: Diagnosis not present

## 2022-10-07 DIAGNOSIS — R634 Abnormal weight loss: Secondary | ICD-10-CM | POA: Diagnosis not present

## 2022-10-07 DIAGNOSIS — G40909 Epilepsy, unspecified, not intractable, without status epilepticus: Secondary | ICD-10-CM | POA: Diagnosis not present

## 2022-10-07 DIAGNOSIS — I4891 Unspecified atrial fibrillation: Secondary | ICD-10-CM | POA: Diagnosis not present

## 2022-10-07 DIAGNOSIS — I69818 Other symptoms and signs involving cognitive functions following other cerebrovascular disease: Secondary | ICD-10-CM | POA: Diagnosis not present

## 2022-10-07 DIAGNOSIS — F0153 Vascular dementia, unspecified severity, with mood disturbance: Secondary | ICD-10-CM | POA: Diagnosis not present

## 2022-10-09 DIAGNOSIS — G40909 Epilepsy, unspecified, not intractable, without status epilepticus: Secondary | ICD-10-CM | POA: Diagnosis not present

## 2022-10-09 DIAGNOSIS — I69818 Other symptoms and signs involving cognitive functions following other cerebrovascular disease: Secondary | ICD-10-CM | POA: Diagnosis not present

## 2022-10-09 DIAGNOSIS — I4891 Unspecified atrial fibrillation: Secondary | ICD-10-CM | POA: Diagnosis not present

## 2022-10-09 DIAGNOSIS — R634 Abnormal weight loss: Secondary | ICD-10-CM | POA: Diagnosis not present

## 2022-10-09 DIAGNOSIS — F0153 Vascular dementia, unspecified severity, with mood disturbance: Secondary | ICD-10-CM | POA: Diagnosis not present

## 2022-10-09 DIAGNOSIS — F01511 Vascular dementia, unspecified severity, with agitation: Secondary | ICD-10-CM | POA: Diagnosis not present

## 2022-10-13 ENCOUNTER — Encounter (HOSPITAL_COMMUNITY): Payer: Self-pay

## 2022-10-13 ENCOUNTER — Emergency Department: Payer: Medicare Other

## 2022-10-13 ENCOUNTER — Other Ambulatory Visit: Payer: Self-pay

## 2022-10-13 ENCOUNTER — Emergency Department
Admission: EM | Admit: 2022-10-13 | Discharge: 2022-10-13 | Disposition: A | Discharge status: Home / Self Care | Payer: Medicare Other | Attending: Internal Medicine | Admitting: Internal Medicine

## 2022-10-13 ENCOUNTER — Inpatient Hospital Stay (HOSPITAL_COMMUNITY)
Admit: 2022-10-13 | Discharge: 2022-10-16 | DRG: 101 | Disposition: A | Discharge status: Hospice | Source: Other Acute Inpatient Hospital | Attending: Internal Medicine | Admitting: Internal Medicine

## 2022-10-13 DIAGNOSIS — I482 Chronic atrial fibrillation, unspecified: Secondary | ICD-10-CM | POA: Diagnosis not present

## 2022-10-13 DIAGNOSIS — E119 Type 2 diabetes mellitus without complications: Secondary | ICD-10-CM | POA: Diagnosis not present

## 2022-10-13 DIAGNOSIS — Z888 Allergy status to other drugs, medicaments and biological substances status: Secondary | ICD-10-CM

## 2022-10-13 DIAGNOSIS — I4821 Permanent atrial fibrillation: Secondary | ICD-10-CM | POA: Diagnosis present

## 2022-10-13 DIAGNOSIS — F0394 Unspecified dementia, unspecified severity, with anxiety: Secondary | ICD-10-CM | POA: Diagnosis present

## 2022-10-13 DIAGNOSIS — Z8542 Personal history of malignant neoplasm of other parts of uterus: Secondary | ICD-10-CM | POA: Diagnosis not present

## 2022-10-13 DIAGNOSIS — Z87891 Personal history of nicotine dependence: Secondary | ICD-10-CM | POA: Insufficient documentation

## 2022-10-13 DIAGNOSIS — Z66 Do not resuscitate: Secondary | ICD-10-CM | POA: Diagnosis present

## 2022-10-13 DIAGNOSIS — I051 Rheumatic mitral insufficiency: Secondary | ICD-10-CM | POA: Diagnosis present

## 2022-10-13 DIAGNOSIS — Z7401 Bed confinement status: Secondary | ICD-10-CM | POA: Diagnosis not present

## 2022-10-13 DIAGNOSIS — Z7901 Long term (current) use of anticoagulants: Secondary | ICD-10-CM | POA: Diagnosis not present

## 2022-10-13 DIAGNOSIS — I2510 Atherosclerotic heart disease of native coronary artery without angina pectoris: Secondary | ICD-10-CM | POA: Diagnosis not present

## 2022-10-13 DIAGNOSIS — I4891 Unspecified atrial fibrillation: Secondary | ICD-10-CM | POA: Diagnosis not present

## 2022-10-13 DIAGNOSIS — F01511 Vascular dementia, unspecified severity, with agitation: Secondary | ICD-10-CM | POA: Diagnosis not present

## 2022-10-13 DIAGNOSIS — L89301 Pressure ulcer of unspecified buttock, stage 1: Secondary | ICD-10-CM | POA: Diagnosis present

## 2022-10-13 DIAGNOSIS — Z833 Family history of diabetes mellitus: Secondary | ICD-10-CM

## 2022-10-13 DIAGNOSIS — I10 Essential (primary) hypertension: Secondary | ICD-10-CM | POA: Diagnosis not present

## 2022-10-13 DIAGNOSIS — G4733 Obstructive sleep apnea (adult) (pediatric): Secondary | ICD-10-CM | POA: Diagnosis present

## 2022-10-13 DIAGNOSIS — F0393 Unspecified dementia, unspecified severity, with mood disturbance: Secondary | ICD-10-CM | POA: Diagnosis present

## 2022-10-13 DIAGNOSIS — F32A Depression, unspecified: Secondary | ICD-10-CM | POA: Diagnosis present

## 2022-10-13 DIAGNOSIS — I639 Cerebral infarction, unspecified: Secondary | ICD-10-CM | POA: Diagnosis not present

## 2022-10-13 DIAGNOSIS — Z8719 Personal history of other diseases of the digestive system: Secondary | ICD-10-CM

## 2022-10-13 DIAGNOSIS — H409 Unspecified glaucoma: Secondary | ICD-10-CM | POA: Diagnosis present

## 2022-10-13 DIAGNOSIS — R29818 Other symptoms and signs involving the nervous system: Secondary | ICD-10-CM | POA: Diagnosis not present

## 2022-10-13 DIAGNOSIS — Z923 Personal history of irradiation: Secondary | ICD-10-CM

## 2022-10-13 DIAGNOSIS — N390 Urinary tract infection, site not specified: Secondary | ICD-10-CM | POA: Diagnosis present

## 2022-10-13 DIAGNOSIS — I69398 Other sequelae of cerebral infarction: Secondary | ICD-10-CM

## 2022-10-13 DIAGNOSIS — E872 Acidosis, unspecified: Secondary | ICD-10-CM | POA: Diagnosis present

## 2022-10-13 DIAGNOSIS — Z9049 Acquired absence of other specified parts of digestive tract: Secondary | ICD-10-CM

## 2022-10-13 DIAGNOSIS — R059 Cough, unspecified: Secondary | ICD-10-CM | POA: Diagnosis not present

## 2022-10-13 DIAGNOSIS — I69354 Hemiplegia and hemiparesis following cerebral infarction affecting left non-dominant side: Secondary | ICD-10-CM

## 2022-10-13 DIAGNOSIS — Z79899 Other long term (current) drug therapy: Secondary | ICD-10-CM | POA: Insufficient documentation

## 2022-10-13 DIAGNOSIS — R531 Weakness: Secondary | ICD-10-CM | POA: Diagnosis not present

## 2022-10-13 DIAGNOSIS — Z950 Presence of cardiac pacemaker: Secondary | ICD-10-CM | POA: Diagnosis not present

## 2022-10-13 DIAGNOSIS — I69818 Other symptoms and signs involving cognitive functions following other cerebrovascular disease: Secondary | ICD-10-CM | POA: Diagnosis not present

## 2022-10-13 DIAGNOSIS — G40909 Epilepsy, unspecified, not intractable, without status epilepticus: Principal | ICD-10-CM | POA: Diagnosis present

## 2022-10-13 DIAGNOSIS — E440 Moderate protein-calorie malnutrition: Secondary | ICD-10-CM | POA: Diagnosis present

## 2022-10-13 DIAGNOSIS — R000 Tachycardia, unspecified: Secondary | ICD-10-CM | POA: Diagnosis not present

## 2022-10-13 DIAGNOSIS — J90 Pleural effusion, not elsewhere classified: Secondary | ICD-10-CM | POA: Diagnosis not present

## 2022-10-13 DIAGNOSIS — R569 Unspecified convulsions: Secondary | ICD-10-CM

## 2022-10-13 DIAGNOSIS — E876 Hypokalemia: Secondary | ICD-10-CM | POA: Diagnosis present

## 2022-10-13 DIAGNOSIS — Z8249 Family history of ischemic heart disease and other diseases of the circulatory system: Secondary | ICD-10-CM

## 2022-10-13 DIAGNOSIS — Z841 Family history of disorders of kidney and ureter: Secondary | ICD-10-CM

## 2022-10-13 DIAGNOSIS — E1165 Type 2 diabetes mellitus with hyperglycemia: Secondary | ICD-10-CM | POA: Diagnosis present

## 2022-10-13 DIAGNOSIS — F0153 Vascular dementia, unspecified severity, with mood disturbance: Secondary | ICD-10-CM | POA: Diagnosis not present

## 2022-10-13 DIAGNOSIS — E785 Hyperlipidemia, unspecified: Secondary | ICD-10-CM | POA: Diagnosis present

## 2022-10-13 DIAGNOSIS — K589 Irritable bowel syndrome without diarrhea: Secondary | ICD-10-CM | POA: Diagnosis present

## 2022-10-13 DIAGNOSIS — R001 Bradycardia, unspecified: Secondary | ICD-10-CM | POA: Diagnosis not present

## 2022-10-13 DIAGNOSIS — Z90710 Acquired absence of both cervix and uterus: Secondary | ICD-10-CM

## 2022-10-13 DIAGNOSIS — A4181 Sepsis due to Enterococcus: Principal | ICD-10-CM | POA: Diagnosis present

## 2022-10-13 DIAGNOSIS — R634 Abnormal weight loss: Secondary | ICD-10-CM | POA: Diagnosis not present

## 2022-10-13 DIAGNOSIS — Z681 Body mass index (BMI) 19 or less, adult: Secondary | ICD-10-CM

## 2022-10-13 DIAGNOSIS — Z9861 Coronary angioplasty status: Secondary | ICD-10-CM

## 2022-10-13 DIAGNOSIS — Z87440 Personal history of urinary (tract) infections: Secondary | ICD-10-CM

## 2022-10-13 LAB — BASIC METABOLIC PANEL
Anion gap: 16 — ABNORMAL HIGH (ref 5–15)
BUN: 11 mg/dL (ref 8–23)
CO2: 26 mmol/L (ref 22–32)
Calcium: 8.8 mg/dL — ABNORMAL LOW (ref 8.9–10.3)
Chloride: 94 mmol/L — ABNORMAL LOW (ref 98–111)
Creatinine, Ser: 0.77 mg/dL (ref 0.44–1.00)
GFR, Estimated: >60 mL/min (ref >60)
Glucose, Bld: 153 mg/dL — ABNORMAL HIGH (ref 70–99)
Potassium: 3.5 mmol/L (ref 3.5–5.1)
Sodium: 136 mmol/L (ref 135–145)

## 2022-10-13 LAB — URINALYSIS, ROUTINE W REFLEX MICROSCOPIC
Bilirubin Urine: NEGATIVE
Glucose, UA: NEGATIVE mg/dL
Ketones, ur: NEGATIVE mg/dL
Nitrite: NEGATIVE
Protein, ur: NEGATIVE mg/dL
Specific Gravity, Urine: 1.011 (ref 1.005–1.030)
pH: 6 (ref 5.0–8.0)

## 2022-10-13 LAB — CBG MONITORING, ED: Glucose-Capillary: 137 mg/dL — ABNORMAL HIGH (ref 70–99)

## 2022-10-13 LAB — CBC
HCT: 43.1 % (ref 36.0–46.0)
Hemoglobin: 14.8 g/dL (ref 12.0–15.0)
MCH: 31.8 pg (ref 26.0–34.0)
MCHC: 34.3 g/dL (ref 30.0–36.0)
MCV: 92.7 fL (ref 80.0–100.0)
Platelets: 241 10*3/uL (ref 150–400)
RBC: 4.65 MIL/uL (ref 3.87–5.11)
RDW: 12.5 % (ref 11.5–15.5)
WBC: 10.8 10*3/uL — ABNORMAL HIGH (ref 4.0–10.5)
nRBC: 0 % (ref 0.0–0.2)

## 2022-10-13 LAB — LACTIC ACID, PLASMA
Lactic Acid, Venous: 3.3 mmol/L (ref 0.5–1.9)
Lactic Acid, Venous: 2.3 mmol/L (ref 0.5–1.9)
Lactic Acid, Venous: 2.1 mmol/L (ref 0.5–1.9)

## 2022-10-13 LAB — MAGNESIUM: Magnesium: 1.6 mg/dL — ABNORMAL LOW (ref 1.7–2.4)

## 2022-10-13 LAB — GLUCOSE, CAPILLARY: Glucose-Capillary: 113 mg/dL — ABNORMAL HIGH (ref 70–99)

## 2022-10-13 LAB — PROTIME-INR
INR: 1.2 (ref 0.8–1.2)
Prothrombin Time: 15.8 seconds — ABNORMAL HIGH (ref 11.4–15.2)

## 2022-10-13 LAB — AMMONIA: Ammonia: 29 umol/L (ref 9–35)

## 2022-10-13 MED ORDER — LORAZEPAM 2 MG/ML IJ SOLN: 2.0000 mg | Freq: Once | INTRAMUSCULAR | Status: AC

## 2022-10-13 MED ORDER — PIPERACILLIN-TAZOBACTAM 3.375 G IVPB
3.3750 g | Freq: Once | INTRAVENOUS | Status: DC
  Administered 2022-10-13: 3.375 g via INTRAVENOUS
  Filled 2022-10-13: qty 50

## 2022-10-13 MED ORDER — OXCARBAZEPINE 150 MG PO TABS
150.0000 mg | ORAL_TABLET | Freq: Two times a day (BID) | ORAL | Status: DC
  Filled 2022-10-13: qty 1

## 2022-10-13 MED ORDER — SODIUM CHLORIDE 0.9 % IV BOLUS
500.0000 mL | Freq: Once | INTRAVENOUS | Status: AC
  Administered 2022-10-13: 500 mL via INTRAVENOUS

## 2022-10-13 MED ORDER — LORAZEPAM 2 MG/ML IJ SOLN
2.0000 mg | Freq: Four times a day (QID) | INTRAMUSCULAR | Status: DC | PRN
  Filled 2022-10-13: qty 1

## 2022-10-13 MED ORDER — LORAZEPAM 2 MG/ML IJ SOLN
INTRAMUSCULAR | Status: AC
  Administered 2022-10-13: 2 mg via INTRAVENOUS
  Filled 2022-10-13: qty 1

## 2022-10-13 MED ORDER — ACETAMINOPHEN 325 MG PO TABS
650.0000 mg | ORAL_TABLET | Freq: Four times a day (QID) | ORAL | Status: DC | PRN
  Administered 2022-10-14 (×2): 650 mg via ORAL
  Filled 2022-10-13 (×2): qty 2

## 2022-10-13 MED ORDER — MELATONIN 3 MG PO TABS: 3.0000 mg | ORAL_TABLET | Freq: Every evening | ORAL | Status: DC | PRN

## 2022-10-13 MED ORDER — PIPERACILLIN-TAZOBACTAM 3.375 G IVPB
3.3750 g | Freq: Three times a day (TID) | INTRAVENOUS | Status: DC
  Administered 2022-10-14 – 2022-10-16 (×8): 3.375 g via INTRAVENOUS
  Filled 2022-10-13 (×8): qty 50

## 2022-10-13 MED ORDER — SODIUM CHLORIDE 0.9 % IV SOLN: 2.0000 g | Freq: Once | INTRAVENOUS | Status: DC

## 2022-10-13 MED ORDER — INSULIN ASPART 100 UNIT/ML IJ SOLN: 0.0000 [IU] | Freq: Three times a day (TID) | INTRAMUSCULAR | Status: DC

## 2022-10-13 MED ORDER — LORAZEPAM 2 MG/ML IJ SOLN: 2.0000 mg | Freq: Once | INTRAMUSCULAR | Status: DC

## 2022-10-13 MED ORDER — INSULIN ASPART 100 UNIT/ML IJ SOLN: 0.0000 [IU] | Freq: Every day | INTRAMUSCULAR | Status: DC

## 2022-10-13 MED ORDER — VANCOMYCIN HCL IN DEXTROSE 1-5 GM/200ML-% IV SOLN: 1000.0000 mg | Freq: Once | INTRAVENOUS | Status: DC

## 2022-10-13 MED ORDER — MAGNESIUM SULFATE IN D5W 1-5 GM/100ML-% IV SOLN
1.0000 g | Freq: Once | INTRAVENOUS | Status: AC
  Administered 2022-10-13: 1 g via INTRAVENOUS
  Filled 2022-10-13: qty 100

## 2022-10-13 MED ORDER — ENOXAPARIN SODIUM 40 MG/0.4ML IJ SOSY
40.0000 mg | PREFILLED_SYRINGE | INTRAMUSCULAR | Status: DC
  Filled 2022-10-13: qty 0.4

## 2022-10-13 MED ORDER — PROCHLORPERAZINE EDISYLATE 10 MG/2ML IJ SOLN: 5.0000 mg | Freq: Four times a day (QID) | INTRAMUSCULAR | Status: DC | PRN

## 2022-10-13 MED ORDER — APIXABAN 2.5 MG PO TABS
2.5000 mg | ORAL_TABLET | Freq: Two times a day (BID) | ORAL | Status: DC
  Administered 2022-10-14 – 2022-10-16 (×5): 2.5 mg via ORAL
  Filled 2022-10-13 (×5): qty 1

## 2022-10-13 MED ORDER — POLYETHYLENE GLYCOL 3350 17 G PO PACK: 17.0000 g | PACK | Freq: Every day | ORAL | Status: DC | PRN

## 2022-10-13 MED ORDER — LEVETIRACETAM IN NACL 500 MG/100ML IV SOLN
500.0000 mg | Freq: Two times a day (BID) | INTRAVENOUS | Status: DC
  Administered 2022-10-13 – 2022-10-15 (×5): 500 mg via INTRAVENOUS
  Filled 2022-10-13 (×5): qty 100

## 2022-10-13 MED ORDER — LEVETIRACETAM IN NACL 1000 MG/100ML IV SOLN
1000.0000 mg | Freq: Once | INTRAVENOUS | Status: AC
  Administered 2022-10-13: 1000 mg via INTRAVENOUS
  Filled 2022-10-13: qty 100

## 2022-10-13 MED ORDER — LACTATED RINGERS IV SOLN: INTRAVENOUS | Status: DC

## 2022-10-13 MED ORDER — LACTATED RINGERS IV BOLUS (SEPSIS): 500.0000 mL | Freq: Once | INTRAVENOUS | Status: DC

## 2022-10-13 NOTE — Progress Notes (Signed)
Plan of Care Note for accepted transfer   Patient: Kara [name] MRN: [id number]   DOA: 10/13/2022  Facility requesting transfer: [facility] Requesting Provider: Fanny Bien Reason for transfer: Continuous EEG  Facility course: Patient with h/o afib, CAD, uterine CA, DM, glaucoma, HTN, HLD, and OSA presenting with shaking, concern for seizures.  Patient presenting with concern for seizures, assistance by Dr. Iver Nestle although she was not actually seen by her.  Prior h/o L-sided seizure activity in the setting of UTI.  Treated with oxcarbazepine.  Discharged into hospice care.  DNR/DNI.  Daughter wishes for non-heroic treatment.  Caregivers noticed ? seizures today of LUE/LLE and called EMS after a couple of hours.  Given 2mg  IV Ativan and since has been lethargic and non-verbal.  Mildly increased anion gap, lactate 3.3.  CXR with chronic changes.  Given a dose of Zosyn.  Needs UA/culture.  Negative head CT.  GOC discussed with daughter.  Dr. Iver Nestle recommended continuous EEG and daughter agreed, although she did have some apparent reservations based on prior Secure Chat conversation.  I have encouraged Dr. Fanny Bien to call back and frankly discuss GOC, as transition to comfort only measures would be appropriate for this 89yo already enrolled in hospice vs. Empiric treatment for UTI without transfer vs. Transfer for continuous EEG.  Dr. Fanny Bien reports that "daughter advised ok to go to [facility]" so will accept.  However, would strongly encourage ongoing GOC conversation with this family given the circumstances.   Plan of care: The patient is accepted for admission to Telemetry unit, at [facility].   Author: Jonah Blue, MD 10/13/2022  Check [email or web address] for on-call coverage.  Nursing staff, Please call [facility] Admits & Consults System-Wide number on Amion as soon as patient's arrival, so appropriate admitting provider can evaluate the pt.

## 2022-10-13 NOTE — Progress Notes (Addendum)
PHARMACY -  BRIEF ANTIBIOTIC NOTE   Pharmacy has received consult(s) for vancomycin and cefepime from an ED provider.  The patient's profile has been reviewed for ht/wt/allergies/indication/available labs.    One time order(s) placed for piperacillin/tazobactam IV 3.375 grams x 1  Further antibiotics/pharmacy consults should be ordered by admitting physician if indicated.                       Thank you,  Elliot Gurney, PharmD, BCPS Clinical Pharmacist  10/13/2022 4:02 PM

## 2022-10-13 NOTE — ED Notes (Signed)
Pt caregivers at bedside, caregiver states pt was given 0.5mg  PO Ativan at home at 12:30pm.

## 2022-10-13 NOTE — Consult Note (Signed)
Neurology Consultation  Reason for Consult: Seizure Referring Physician: Dr. Sharyn Creamer  CC: Seizure  History is obtained from: [name]  HPI: Kara [name] is a 89 y.o. 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  Patient's case [name] was at bedside reports that she had been feeling unwell since Thursday, they had feared that she probably has a UTI but it was not treated. Her poststroke epilepsy-first seizure was in April with twitching of the left face and arm with retained awareness.  Today, the concern was more for right-sided twitching. She was transferred to [facility] from [facility] where she had initially presented for LTM based on discussions with the covering neurologist at the time. She is a DNR, does not want aggressive or heroic measures but her mental status was not improving despite giving her antiepileptic in the ED and transfer was made for LTM pending further goals of care discussion. She was also noted to have pyuria on labs. She is on hospice care at baseline.  There is some question of benzodiazepine medication usage confusion per the on-call neurologist's plan of care note.  This needs to be clarified from the facility as to how many doses of benzodiazepines was she getting every day.  Last neurology progress note from 08/28/2022 listed antiepileptics: Oxcarbazepine 150 mg twice daily, clonazepam 2.5 mg as needed for clinical seizures, insomnia and anxiety. Rescue medication Valtoco if affordable.  ROS: Unable to obtain due  to patient's depressed mental status  Past Medical History:  Diagnosis Date   Atrial fibrillation (HCC)    CAD (coronary artery disease)    Cancer (HCC)    UTERINE   Closed compression fracture of L2 lumbar vertebra, initial encounter (HCC) 11/08/2020   By CT scan done to evaluate severe persistent low back pain:  Impression below also notes spinal compression from bulging disk.     Superior endplate compression fracture of L2 with approximately 25% height loss and minimal, 2-3 mm retropulsion of the superior endplate.   Left-sided, nondisplaced zone 1 sacral insufficiency fracture.   Multilevel degenerative changes of the lumbar spine resulting    Decubitus ulcer    sacral region   Diabetes (HCC)    diet controlled   Facial fracture due to fall (HCC) 07/13/2020   Fibrocystic breast disease    GERD (gastroesophageal reflux disease)    Glaucoma    Hematuria    Hemorrhoids    History of colon polyps    History of pancreatitis    Hyperlipidemia    Hypertension    IBS (irritable bowel syndrome)    Mitral valve regurgitation    Pernicious anemia    Plantar fasciitis    Recurrent UTI    Sleep apnea, obstructive    Vaginal atrophy    Vitamin D deficiency    Yeast vaginitis      Family History  Problem Relation Age of Onset   Coronary artery disease Father    Kidney disease Father    Cancer Sister    Diabetes Other    Cancer Mother        COLON  Bladder Cancer Neg Hx    Breast cancer Neg Hx      Social History:   reports that she has quit smoking. She has never used smokeless tobacco. She reports current alcohol use. She reports that she does not use drugs.  Medications  Current Facility-Administered Medications:    acetaminophen (TYLENOL) tablet 650 mg, 650 mg, Oral, Q6H PRN, Hall, Carole N, DO   apixaban (ELIQUIS) tablet 2.5 mg, 2.5 mg, Oral, BID, Hall, Carole N, DO   insulin aspart (novoLOG) injection 0-5 Units, 0-5 Units, Subcutaneous, QHS, Hall, Carole N, DO    [START ON 10/14/2022] insulin aspart (novoLOG) injection 0-9 Units, 0-9 Units, Subcutaneous, TID WC, Hall, Carole N, DO   lactated ringers infusion, , Intravenous, Continuous, Dow Adolph N, DO, Last Rate: 50 mL/hr at 10/13/22 2025, New Bag at 10/13/22 2025   LORazepam (ATIVAN) injection 2 mg, 2 mg, Intravenous, Q6H PRN, Hall, Carole N, DO   melatonin tablet 3 mg, 3 mg, Oral, QHS PRN, Hall, Carole N, DO   OXcarbazepine (TRILEPTAL) tablet 150 mg, 150 mg, Oral, BID, Hall, Carole N, DO   [START ON 10/14/2022] piperacillin-tazobactam (ZOSYN) IVPB 3.375 g, 3.375 g, Intravenous, Q8H, Hall, Carole N, DO   polyethylene glycol (MIRALAX / GLYCOLAX) packet 17 g, 17 g, Oral, Daily PRN, Hall, Carole N, DO   prochlorperazine (COMPAZINE) injection 5 mg, 5 mg, Intravenous, Q6H PRN, Darlin Drop, DO   Exam: Current vital signs: BP (!) 123/49 (BP Location: Left Arm)   Pulse 74   Temp 98.3 F (36.8 C) (Oral)   Resp 20   Ht 5\' 2"  (1.575 m)   Wt 45.7 kg   SpO2 100%   BMI 18.43 kg/m  Vital signs in last 24 hours: Temp:  [97.8 F (36.6 C)-98.3 F (36.8 C)] 98.3 F (36.8 C) (05/27 1948) Pulse Rate:  [67-135] 74 (05/27 1948) Resp:  [16-20] 20 (05/27 2000) BP: (103-144)/(48-77) 123/49 (05/27 1948) SpO2:  [90 %-100 %] 100 % (05/27 1948) Weight:  [45.7 kg] 45.7 kg (05/27 1948) General: She is obtunded HEENT: Normocephalic atraumatic Lungs: Clear Cardiovascular: Regular rhythm Abdomen nondistended nontender Neurological exam She is obtunded Does not open eyes to voice or noxious stimulation Actively resists eye opening Nonverbal Cranial nerves: Pupils are mildly asymmetric-left pupil is 2 mm, right pupil is pinpoint.  Does not blink to threat from either side-resists eye opening so exam is difficult.  Face appears symmetric. Motor examination with grimace to noxious stimulation in all fours but does not really move any of the 4 extremities much. Sensation as above  Labs I have reviewed labs in  epic and the results pertinent to this consultation are: CBC    Component Value Date/Time   WBC 10.8 (H) 10/13/2022 1512   RBC 4.65 10/13/2022 1512   HGB 14.8 10/13/2022 1512   HGB 13.7 05/22/2014 1404   HCT 43.1 10/13/2022 1512   HCT 41.1 05/22/2014 1404   PLT 241 10/13/2022 1512   PLT 193 05/22/2014 1404   MCV 92.7 10/13/2022 1512   MCV 92 05/22/2014 1404   MCH 31.8 10/13/2022 1512   MCHC 34.3 10/13/2022 1512   RDW 12.5 10/13/2022 1512   RDW 13.7 05/22/2014 1404   LYMPHSABS 2.5 08/27/2022 0442   LYMPHSABS 2.6 05/22/2014 1404   MONOABS 0.4 08/27/2022 0442   MONOABS 0.4 05/22/2014 1404   EOSABS 0.2 08/27/2022 0442   EOSABS 0.1 05/22/2014 1404   BASOSABS 0.0 08/27/2022 0442   BASOSABS 0.0 05/22/2014 1404  CMP     Component Value Date/Time   NA 136 10/13/2022 1512   NA 142 05/22/2014 1404   K 3.5 10/13/2022 1512   K 3.2 (L) 05/22/2014 1404   CL 94 (L) 10/13/2022 1512   CL 105 05/22/2014 1404   CO2 26 10/13/2022 1512   CO2 28 05/22/2014 1404   GLUCOSE 153 (H) 10/13/2022 1512   GLUCOSE 135 (H) 05/22/2014 1404   BUN 11 10/13/2022 1512   BUN 18 05/22/2014 1404   CREATININE 0.77 10/13/2022 1512   CREATININE 0.82 09/15/2014 1536   CALCIUM 8.8 (L) 10/13/2022 1512   CALCIUM 8.7 05/22/2014 1404   PROT 5.2 (L) 08/27/2022 0442   PROT 5.7 (L) 02/05/2012 0700   ALBUMIN 2.2 (L) 08/27/2022 0442   ALBUMIN 2.5 (L) 02/05/2012 0700   AST 18 08/27/2022 0442   AST 17 02/05/2012 0700   ALT 9 08/27/2022 0442   ALT 19 02/05/2012 0700   ALKPHOS 46 08/27/2022 0442   ALKPHOS 76 02/05/2012 0700   BILITOT 1.0 08/27/2022 0442   BILITOT 0.7 02/05/2012 0700   GFRNONAA >60 10/13/2022 1512   GFRNONAA 56 (L) 05/22/2014 1404   GFRNONAA >60 02/12/2012 0735   GFRAA >60 06/13/2019 0525   GFRAA >60 05/22/2014 1404   GFRAA >60 02/12/2012 0735    Lipid Panel     Component Value Date/Time   CHOL 122 05/02/2022 0619   TRIG 114 05/02/2022 0619   HDL 41 05/02/2022 0619   CHOLHDL 3.0  05/02/2022 0619   VLDL 23 05/02/2022 0619   LDLCALC 58 05/02/2022 0619   LDLDIRECT 74.0 11/18/2021 1220   Imaging I have reviewed the images obtained: CT head no acute changes.  Old right MCA, right PCA and right cerebellar infarcts.  Assessment:  89 year old with prior right MCA/right PCA strokes with residual left hemiparesis, poststroke epilepsy, CAD, atrial fibrillation on Eliquis, frequent UTIs, currently on hospice care brought in for concern for seizure at the facility.  Mental status remained depressed after the seizure Transfer to [facility] for LTM to rule out status epilepticus. Question possibly not getting benzo doses as prescribed versus UTI lowering seizure threshold.  Recommendations: Given a load of Keppra 1000 mg at [facility] ED. I would be reluctant to use Vimpat (possible history of heart block) or fosphenytoin and will continue Keppra.  Phenytoin will interact with her Eliquis as well. Her side effects from Keppra were emotional lability and at this point without p.o. access, I think Keppra might be the safest medicine to use for seizure control. I will order Keppra 500 twice daily Once she is okay to take p.o. medications, resume oxcarbazepine 150 mg twice daily She was also supposed to be on Klonopin.  I will hold off on benzos tonight given her somnolent/obtunded mentation. Can resume her Klonopin when she is more awake or change the benzo for short duration of time parenterally to Ativan in the morning. LTM EEG  Plan was discussed with Ms. Kara Beers at bedside. Plan relayed to Dr. Margo Aye via secure chat.  AM team to update Ms. Ginger as well as patient's daughter over the phone after LTM results are available.  -- Milon Dikes, MD Neurologist [facility] Pager: [phone]

## 2022-10-13 NOTE — Plan of Care (Addendum)
These are curbside recommendations based upon the information readily available in the chart on brief review as well as history and examination information provided to me by requesting provider and do not replace a full detailed consult   I did also speak to patient's family Gwyneth Revels [phone]), 17 minutes were spent on this conversation.   This is a 89 year old woman with a past medical history significant for atrial fibrillation on apixaban, prior large right hemispheric strokes (MCA and right fetal PCA complicated by posterior epilepsy), coronary artery disease, pacermaker (battery replacement deferred in the past due to goals of care), diabetes, frequent UTIs, hypertension, hyperlipidemia, baseline dementia.  I last saw her in early April 2024 when she was admitted for left-sided weakness and twitching involving the face and arm with retained awareness.  She was initially started on Keppra but due to ongoing episodic twitching clonazepam was added.  She was additionally having some other movements (abdominal twitching, tremor) that were less convincingly seizure activity.  She was therefore transferred to [facility] for LTM EEG.  Decision was made to change Keppra to oxcarbazepine 150 mg twice daily as she had frequent crying and had been recently trialed on Celexa which had been discontinued.  Clonazepam was discontinued due to sedation.  There was concern for breakthrough seizure today at her facility.  She was given her as needed Ativan for anxiety (0.5 mg by mouth).  Subsequently she had further witnessed activity concerning for seizure by ED provider Dr. Arnoldo Morale and was given 2 mg of Ativan at 1504.  On Dr. Lorenza Chick later evaluation she was continuing to have some left-sided slow arm twitching, unclear whether this represented tremor or continued focal seizure activity.  She was also fairly obtunded from having recently received Ativan  Daughter reports that patient was recently restarted  on Celexa 2 weeks ago, there has been no other concern for breakthrough seizure activity until today. Daughter does not think patient has been getting ativan often at the facility but benzo use will need to be clarified.   Given his history I am concerned about the possibility of focal status.  The patient would not want aggressive measures such as intubation but seizure control is within goals of care.  I am also concerned about the possibility of her getting overtreated with sedating medications for tremors that are not seizure activity.  Long-term EEG monitoring would be the best way to clarify for spell capture as well as to rule out subclinical or subtle seizures given her ongoing twitching and depressed mental status  Seizure threshold may be lower in the setting of hypomagnesemia and concern for pneumonia on chest x-ray as reviewed by Dr. Fanny Bien (radiology read negative).  Seizure threshold may also be lower secondary to initiation of Celexa   Basic Metabolic Panel: Recent Labs  Lab 10/13/22 1512  NA 136  K 3.5  CL 94*  CO2 26  GLUCOSE 153*  BUN 11  CREATININE 0.77  CALCIUM 8.8*  MG 1.6*    CBC: Recent Labs  Lab 10/13/22 1512  WBC 10.8*  HGB 14.8  HCT 43.1  MCV 92.7  PLT 241  (Baseline WBC 5)  Coagulation Studies: Recent Labs    10/13/22 1512  LABPROT 15.8*  INR 1.2    Impression: Concern for breakthrough seizures in the setting of low magnesium and possible pneumonia, with ongoing twitching -- unclear tremor vs. seizure activity   Recommendations: -S/p Keppra 1000 mg loading dose per ED provider -Oxcarbazepine level ordered ([facility] send  out) -Please replete hypomagnesemia and monitor electrolytes closely -Avoid cefepime due to neurotoxicity and seizure threshold lowering effects; ED provider suggested meropenem as a substitute which is less neurotoxic. -Appreciate infectious workup per ED/primary -Hold Celexa -Consider fosphenytoin as a substitute for  oxcarbazepine if she remains too sleepy to take PO, consider starting with 75 mg BID and monitor levels and clinical response closely (avoiding vimpat due to known heart block and unclear if pacer battery working, low dose of fosphenytoin due to interaction with oxcarb) -Admit to [facility] for continuous EEG monitoring -Neurology should be notified on patient arrival and will follow in consultation  Brooke Dare MD-PhD [facility] [phone]

## 2022-10-13 NOTE — ED Notes (Signed)
[facility] called for Hospitalist consult for admission.  Spoke with [employer].

## 2022-10-13 NOTE — Progress Notes (Signed)
Pharmacy Antibiotic Note  Kara [name] is a 89 y.o. female admitted on 10/13/2022 with sepsis.  Pharmacy has been consulted for Zosyn dosing.  Huntley Dec presented from an assisted living facility to [facility] with shaking movements concerning for seizure activity. She was transferred to [facility] for continuous EEG monitoring.   Urine analysis positive for pyuria, small leukocytes, and rare bacteria. Culture pending. Her lactic acid levels have trended down from 3.3 to 2.3 since starting Zosyn empirically in the ED. Her white blood cell count is elevated at 10.8k. She is afebrile.   Plan: Continue Zosyn 3.375g q8 hours extended infusion  Monitor renal function, cultures, signs and symptoms of infection, and ability to narrow   Weight: 45.7 kg (100 lb 12 oz)  Temp (24hrs), Avg:98 F (36.7 C), Min:97.8 F (36.6 C), Max:98.3 F (36.8 C)  Recent Labs  Lab 10/13/22 1512 10/13/22 1542 10/13/22 1713  WBC 10.8*  --   --   CREATININE 0.77  --   --   LATICACIDVEN  --  3.3* 2.3*    Estimated Creatinine Clearance: 28.3 mL/min (by C-G formula based on SCr of 0.77 mg/dL).    Allergies  Allergen Reactions   Baycol [Cerivastatin Sodium]    Cardizem [Diltiazem Hcl]     LOWER EXTREMITY EDEMA   Crestor [Rosuvastatin Calcium] Other (See Comments)    INTOLERANT   Cymbalta [Duloxetine Hcl] Nausea Only   Dronedarone Other (See Comments)    Fatigue   Metoprolol Tartrate Other (See Comments)   Omeprazole Other (See Comments)   Pravachol     INTOLERANT   Statins Other (See Comments)   Vioxx [Rofecoxib]    Zocor [Simvastatin]     INTOLERANT     Microbiology results: 10/13/22 BCx: sent 10/13/22 UCx: sent    Sputum:    MRSA PCR:   Thank you for allowing pharmacy to be a part of this patient's care.  Blane Ohara, PharmD  PGY1 Pharmacy Resident

## 2022-10-13 NOTE — H&P (Addendum)
History and Physical  Kara [name] JXB:[id number] DOB: [birth year] DOA: 10/13/2022  Referring physician: Accepted by Dr. Ophelia [name] [facility], Hospitalist service.  PCP: Kara Regulus, MD  Outpatient Specialists: Palliative Care/Hospice Care Patient coming from: Independent living facility for hospice care.  Chief Complaint: Seizures  HPI: Kara [name] is a 89 y.o. female with medical history significant for baseline dementia, permanent atrial fibrillation on Eliquis, sick sinus syndrome status post pacemaker placement, type 2 diabetes, hyperlipidemia, hypertension, coronary artery disease, chronic anxiety/depression, ischemic stroke in December 2023 with residual left sided weakness, recurrent urinary tract infections, who initially presented to [facility] ED from independent living facility for hospice care, due to shaking, seizure-like activities.  Patient's home RN suspected seizures and therefore 0.5 mg of Ativan was given around 12:30 PM.  EMS was activated.  The patient was brought into the ED for further evaluation.  At [facility] ED, EDP discussed the case with neurology and with the patient's daughter who is also the medical power of attorney.  Decision was made to transfer to [facility] for continuous EEG assessment and neurology consultation.  UA was positive for pyuria, with tachycardia of 135 and lactic acidosis 3.3 code sepsis was called to the ED.  Peripheral blood cultures x 2 were obtained and the patient was started on Zosyn empirically.  Accepted by Dr. Ophelia [name], [facility], hospitalist service, and transferred to [facility] telemetry medical unit as inpatient status.   At the time of this visit, the patient was sedated and does not appear in distress.  ED Course: Temperature 98.3.  BP 123/49, pulse 74, respiratory 16, O2 saturation 100% on 2 L.  Lab studies markable for lactic acid 3.3, repeat 2.3.  Serum glucose 153, magnesium 1.6, anion gap 16, serum bicarb 26.  WBC 10.8.  Review  of Systems: Review of systems as noted in the HPI. All other systems reviewed and are negative.   Past Medical History:  Diagnosis Date   Atrial fibrillation (HCC)    CAD (coronary artery disease)    Cancer (HCC)    UTERINE   Closed compression fracture of L2 lumbar vertebra, initial encounter (HCC) 11/08/2020   By CT scan done to evaluate severe persistent low back pain:  Impression below also notes spinal compression from bulging disk.     Superior endplate compression fracture of L2 with approximately 25% height loss and minimal, 2-3 mm retropulsion of the superior endplate.   Left-sided, nondisplaced zone 1 sacral insufficiency fracture.   Multilevel degenerative changes of the lumbar spine resulting    Decubitus ulcer    sacral region   Diabetes (HCC)    diet controlled   Facial fracture due to fall (HCC) 07/13/2020   Fibrocystic breast disease    GERD (gastroesophageal reflux disease)    Glaucoma    Hematuria    Hemorrhoids    History of colon polyps    History of pancreatitis    Hyperlipidemia    Hypertension    IBS (irritable bowel syndrome)    Mitral valve regurgitation    Pernicious anemia    Plantar fasciitis    Recurrent UTI    Sleep apnea, obstructive    Vaginal atrophy    Vitamin D deficiency    Yeast vaginitis    Past Surgical History:  Procedure Laterality Date   ABDOMINAL HYSTERECTOMY  1980's   ABDOMINAL SURGERY     for villous polyp,,,many years ago   APPENDECTOMY  1940's   ASCAD, s/p PTCA  11/28/2005  MID LESION    BREAST BIOPSY Left 1970's   CARPAL TUNNEL RELEASE Right 12/13/2015   Procedure: CARPAL TUNNEL RELEASE;  Surgeon: Juanell Fairly, MD;  Location: [facility] ORS;  Service: Orthopedics;  Laterality: Right;   COLECTOMY  2015   INTRAMEDULLARY (IM) NAIL INTERTROCHANTERIC Left 06/11/2019   Procedure: INTRAMEDULLARY (IM) NAIL INTERTROCHANTRIC;  Surgeon: Deeann Saint, MD;  Location: [facility] ORS;  Service: Orthopedics;  Laterality: Left;   IR CT HEAD LTD   05/01/2022   IR PERCUTANEOUS ART THROMBECTOMY/INFUSION INTRACRANIAL INC DIAG ANGIO  05/01/2022   IR US GUIDE VASC ACCESS RIGHT  05/01/2022   KYPHOPLASTY N/A 11/15/2020   Procedure: Nicki Reaper;  Surgeon: Kennedy Bucker, MD;  Location: [facility] ORS;  Service: Orthopedics;  Laterality: N/A;   PACEMAKER PLACEMENT     radation     for uterine cance   RADIOLOGY WITH ANESTHESIA N/A 05/01/2022   Procedure: IR WITH ANESTHESIA;  Surgeon: Radiologist, Medication, MD;  Location: [facility] OR;  Service: Radiology;  Laterality: N/A;   REFRACTIVE SURGERY     for bilateral glaumoma   SACROPLASTY N/A 11/15/2020   Procedure: SACROPLASTY;  Surgeon: Kennedy Bucker, MD;  Location: [facility] ORS;  Service: Orthopedics;  Laterality: N/A;   TONSILLECTOMY  1936    Social History:  reports that she has quit smoking. She has never used smokeless tobacco. She reports current alcohol use. She reports that she does not use drugs.   Allergies  Allergen Reactions   Baycol [Cerivastatin Sodium]    Cardizem [Diltiazem Hcl]     LOWER EXTREMITY EDEMA   Crestor [Rosuvastatin Calcium] Other (See Comments)    INTOLERANT   Cymbalta [Duloxetine Hcl] Nausea Only   Dronedarone Other (See Comments)    Fatigue   Metoprolol Tartrate Other (See Comments)   Omeprazole Other (See Comments)   Pravachol     INTOLERANT   Statins Other (See Comments)   Vioxx [Rofecoxib]    Zocor [Simvastatin]     INTOLERANT    Family History  Problem Relation Age of Onset   Coronary artery disease Father    Kidney disease Father    Cancer Sister    Diabetes Other    Cancer Mother        COLON   Bladder Cancer Neg Hx    Breast cancer Neg Hx       Prior to Admission medications   Medication Sig Start Date End Date Taking? Authorizing Provider  acetaminophen (TYLENOL) 325 MG tablet Take 2 tablets (650 mg total) by mouth every 4 (four) hours as needed for mild pain (or temp > 37.5 C (99.5 F)). 08/30/22   Lonia Blood, MD  apixaban (ELIQUIS)  2.5 MG TABS tablet Take 1 tablet (2.5 mg total) by mouth 2 (two) times daily. 08/30/22   Lonia Blood, MD  clonazePAM (KLONOPIN) 0.5 MG disintegrating tablet Take 1 tablet (0.5 mg total) by mouth as needed for seizure (seizure lasting over 2 minutes). 08/30/22   Lonia Blood, MD  loperamide (IMODIUM) 2 MG capsule Take 1 capsule (2 mg total) by mouth as needed for diarrhea or loose stools. 08/30/22   Lonia Blood, MD  melatonin 3 MG TABS tablet Take 1 tablet (3 mg total) by mouth at bedtime. 08/30/22   Lonia Blood, MD  metoprolol tartrate (LOPRESSOR) 25 MG tablet Take 0.5 tablets (12.5 mg total) by mouth 2 (two) times daily. 08/30/22   Lonia Blood, MD  OXcarbazepine (TRILEPTAL) 150 MG tablet Take 1 tablet (150 mg  total) by mouth 2 (two) times daily. 08/30/22   Lonia Blood, MD  potassium chloride (KLOR-CON) 20 MEQ packet Take 20 mEq by mouth daily. 08/30/22   Lonia Blood, MD    Physical Exam: BP (!) 123/49 (BP Location: Left Arm)   Pulse 74   Temp 98.3 F (36.8 C) (Oral)   Resp 16   Wt 45.7 kg   SpO2 100%   BMI 18.43 kg/m   General: 89 y.o. year-old female well developed well nourished in no acute distress.  Alert and oriented x3. Cardiovascular: Regular rate and rhythm with no rubs or gallops.  No thyromegaly or JVD noted.  No lower extremity edema. 2/4 pulses in all 4 extremities. Respiratory: Clear to auscultation with no wheezes or rales. Good inspiratory effort. Abdomen: Soft nontender nondistended with normal bowel sounds x4 quadrants. Muskuloskeletal: No cyanosis, clubbing or edema noted bilaterally Neuro: CN II-XII intact, strength, sensation, reflexes Skin: No ulcerative lesions noted or rashes Psychiatry: Judgement and insight appear normal. Mood is appropriate for condition and setting          Labs on Admission:  Basic Metabolic Panel: Recent Labs  Lab 10/13/22 1512  NA 136  K 3.5  CL 94*  CO2 26  GLUCOSE 153*  BUN 11   CREATININE 0.77  CALCIUM 8.8*  MG 1.6*   Liver Function Tests: No results for input(s): "AST", "ALT", "ALKPHOS", "BILITOT", "PROT", "ALBUMIN" in the last 168 hours. No results for input(s): "LIPASE", "AMYLASE" in the last 168 hours. Recent Labs  Lab 10/13/22 1542  AMMONIA 29   CBC: Recent Labs  Lab 10/13/22 1512  WBC 10.8*  HGB 14.8  HCT 43.1  MCV 92.7  PLT 241   Cardiac Enzymes: No results for input(s): "CKTOTAL", "CKMB", "CKMBINDEX", "TROPONINI" in the last 168 hours.  BNP (last 3 results) No results for input(s): "BNP" in the last 8760 hours.  ProBNP (last 3 results) No results for input(s): "PROBNP" in the last 8760 hours.  CBG: Recent Labs  Lab 10/13/22 1554  GLUCAP 137*    Radiological Exams on Admission: DG Chest Portable 1 View  Result Date: 10/13/2022 CLINICAL DATA:  Cough, seizure EXAM: PORTABLE CHEST 1 VIEW COMPARISON:  05/18/2022 FINDINGS: Cardiomegaly with left chest multi lead pacer. Small layering bilateral pleural effusions. Probable emphysema. No acute airspace opacity. Chronic fracture deformity of the proximal left humerus. IMPRESSION: 1. Cardiomegaly with small layering bilateral pleural effusions. No acute airspace opacity. 2. Probable emphysema. 3. Chronic fracture deformity of the proximal left humerus. Electronically Signed   By: Jearld Lesch M.D.   On: 10/13/2022 15:59   CT Head Wo Contrast  Result Date: 10/13/2022 CLINICAL DATA:  Possible seizure. EXAM: CT HEAD WITHOUT CONTRAST TECHNIQUE: Contiguous axial images were obtained from the base of the skull through the vertex without intravenous contrast. RADIATION DOSE REDUCTION: This exam was performed according to the departmental dose-optimization program which includes automated exposure control, adjustment of the mA and/or kV according to patient size and/or use of iterative reconstruction technique. COMPARISON:  CT head dated August 23, 2022. FINDINGS: Brain: No evidence of acute infarction,  hemorrhage, hydrocephalus, extra-axial collection or mass lesion/mass effect. Large chronic right sided infarct involving the MCA and PCA territories again noted. Old right cerebellar infarct again noted. Stable atrophy and chronic microvascular ischemic changes. Vascular: Calcified atherosclerosis at the skull base. No hyperdense vessel. Skull: Normal. Negative for fracture or focal lesion. Sinuses/Orbits: No acute finding. Other: None. IMPRESSION: 1. No acute intracranial abnormality. Unchanged  old right cerebral and cerebellar infarcts. Electronically Signed   By: Obie Dredge M.D.   On: 10/13/2022 15:54    EKG: I independently viewed the EKG done and my findings are as followed: Atrial fibrillation rate of 85.  Nonspecific ST changes with QTc 596.  Assessment/Plan Present on Admission: **None**  Principal Problem:   Seizure (HCC)  Seizure activity Possibly triggered by sepsis Treat underlying conditions, sepsis due to presumed UTI Resume home AEDs when more alert LTM EEG Neurology consulted Continue seizure precautions IV Ativan as needed for breakthrough seizures  Sepsis secondary to presumed UTI, POA UA equivocal, tachycardia rate of 135, lactic acidosis 3.3 Follow urine culture for ID and sensitivities. Continue Zosyn initiated in the ED due to sepsis De-escalate antibiotics when able. Monitor fever curve and WBCs Maintain MAP greater than 65  QTc prolongation QTc on admission 596 Avoid QTc prolonging agents Optimize magnesium level greater than 2.0 Optimize potassium level greater than 4.0. Monitor on telemetry.  Type 2 diabetes with hyperglycemia Last hemoglobin A1c 6.6 on 08/23/2022. Heart healthy carb modified diet when more alert Aspiration precautions Start insulin sliding scale  Permanent A-fib on Eliquis Resume home Eliquis when more alert Continue to monitor on telemetry  Chronic anxiety/depression Taken off home Celexa due to concern for lowering  seizure threshold. Reassess when no longer sedated.  Goals of care Patient recently admitted in April 2024 for new onset seizure and discharged to independent living facility for hospice care. Currently DNR Palliative care consulted to facilitate management and goals of care discussion.   DVT prophylaxis: Home Eliquis  Code Status: DNR  Family Communication: None at bedside  Disposition Plan: Admitted to telemetry medical unit  Consults called: Neurology, palliative care team.  Admission status: Inpatient status.   Status is: Inpatient The patient requires at least 2 midnights for further evaluation and treatment of present condition.   Darlin Drop MD [facility] Pager [phone]  If 7PM-7AM, please contact night-coverage [email or web address] Password [facility]  10/13/2022, 7:57 PM

## 2022-10-13 NOTE — ED Notes (Signed)
[facility] 3W, bed 5.  [id number], for report.

## 2022-10-13 NOTE — ED Triage Notes (Addendum)
Pt arrived via ems due to "shaking". Pts home RN suspected seizures, 0.5mg  ativan given. Route unknown, time unknown. EMS states pt is at baseline on arrival besides seizures. Pt arrived via DNR paperwork.

## 2022-10-13 NOTE — Sepsis Progress Note (Signed)
Notified provider and bedside nurse of need to order and administer antibiotics. 

## 2022-10-13 NOTE — ED Provider Notes (Signed)
[facility] Emergency Department Provider Note   ____________________________________________   Event Date/Time   First MD Initiated Contact with Patient 10/13/22 1513     (approximate)  I have reviewed the triage vital signs and the nursing notes.   HISTORY  Chief Complaint Seizures  EM caveat: Patient nonresponsive to verbal, very lethargic at this time (received Ativan prior to my evaluation, Dr. Arnoldo Morale ordered and advised patient was alert, verbally communicative with seizure-like movements on L side on ems arrival)  HPI Kara [name] is a 89 y.o. female with a history of sick sinus syndrome, previous stroke with left-sided deficits, seizures in the setting of urinary tract infection  Upon assessing the patient, also discussing with her aides were at the bedside, I called and spoke with the patient's daughter who is identified as the patient's power of attorney. (Ms. Andria Meuse), and after discussion it seems that priority in care is comfort, but they do wish for nonheroic type interventions.  The patient is DNR DNI.  Is very clear that intubation, advanced respiratory support, CPR are not in her goals of care.  Daughter advises though that they do wish to pursue diagnostic testing IV medications, etc.  She also advised that after her stroke in roughly March she came in with very similar symptoms that were associated with left-sided twitching and seizure-like activity she was first seen at [facility] then transferred to [facility] and left into the care of hospice  Past Medical History:  Diagnosis Date   Atrial fibrillation (HCC)    CAD (coronary artery disease)    Cancer (HCC)    UTERINE   Closed compression fracture of L2 lumbar vertebra, initial encounter (HCC) 11/08/2020   By CT scan done to evaluate severe persistent low back pain:  Impression below also notes spinal compression from bulging disk.     Superior endplate compression fracture of L2 with  approximately 25% height loss and minimal, 2-3 mm retropulsion of the superior endplate.   Left-sided, nondisplaced zone 1 sacral insufficiency fracture.   Multilevel degenerative changes of the lumbar spine resulting    Decubitus ulcer    sacral region   Diabetes (HCC)    diet controlled   Facial fracture due to fall (HCC) 07/13/2020   Fibrocystic breast disease    GERD (gastroesophageal reflux disease)    Glaucoma    Hematuria    Hemorrhoids    History of colon polyps    History of pancreatitis    Hyperlipidemia    Hypertension    IBS (irritable bowel syndrome)    Mitral valve regurgitation    Pernicious anemia    Plantar fasciitis    Recurrent UTI    Sleep apnea, obstructive    Vaginal atrophy    Vitamin D deficiency    Yeast vaginitis     Patient Active Problem List   Diagnosis Date Noted   Seizure-like activity (HCC) 10/13/2022   Malnutrition of moderate degree (HCC) 08/26/2022   Seizure (HCC) 08/26/2022   Hypomagnesemia 08/26/2022   Depression 08/26/2022   GERD (gastroesophageal reflux disease) 08/26/2022   Partial seizure (HCC) 08/23/2022   DM2 (diabetes mellitus, type 2) (HCC) 08/23/2022   Magnesium deficiency 08/23/2022   Failure to thrive in adult 08/23/2022   Secondary hypercoagulable state (HCC) 08/23/2022   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   Ischemic stroke (HCC) 05/01/2022   Vulvar irritation 04/22/2022   Lower abdominal pain 08/14/2021   Pain in left upper arm 08/14/2021  Dystrophic nail 08/14/2021   Urinary frequency 02/16/2021   Back pain 11/14/2020   Compression fx, lumbar spine, sequela 11/13/2020   Lumbar spinal stenosis 11/08/2020   Traumatic hemorrhage of cerebrum (HCC) 07/31/2020   History of recent fall 07/30/2020   Intraparenchymal hemorrhage of brain (HCC) 07/13/2020   Syncope and collapse 07/13/2020   Hypokalemia 07/13/2020   Atrial fibrillation, chronic (HCC) 07/13/2020   DNR (do not resuscitate) 07/13/2020   S/P  placement of cardiac pacemaker 07/03/2020   Myalgia due to statin 07/03/2020   Aortic atherosclerosis (HCC) 07/03/2020   Persistent atrial fibrillation (HCC) 08/12/2019   Fracture of neck of femur (HCC) 06/27/2019   Displaced intertrochanteric fracture of left femur, sequela 06/25/2019   Age-related osteoporosis with current pathological fracture with routine healing 06/15/2019   Educated about COVID-19 virus infection 10/12/2018   Dysuria 02/02/2018   Edema of lower extremity 11/24/2017   Venous insufficiency of both lower extremities 11/24/2016   Essential hypertension 11/11/2016   Peripheral artery disease (HCC) 11/11/2016   Chronic pulmonary hypertension (HCC) 10/20/2016   Leg swelling 06/17/2016   Type 2 diabetes mellitus with diabetic neuropathy, unspecified (HCC) 06/17/2016   Urinary incontinence, urge 12/27/2015   Atrophic vaginitis 09/26/2015   Severe tricuspid valve insufficiency 07/04/2015   Carpal tunnel syndrome of right wrist 05/29/2015   Chronic fatigue 05/01/2015   E. coli UTI 11/21/2014   Urethral caruncle 11/21/2014   Acute cystitis 10/26/2014   Mitral valve regurgitation 05/09/2014   Malaise and fatigue 03/09/2014   SSS (sick sinus syndrome) (HCC) 10/11/2013   CAD (coronary artery disease) 10/11/2013   Osteoarthritis 03/08/2012   Screening for breast cancer 03/08/2012   Pernicious anemia 02/04/2012   History of colectomy 02/04/2012   Vitamin D deficiency 07/30/2011   Acquired thrombophilia (HCC) 06/13/2011   Urinary tract infection 06/13/2011   Permanent atrial fibrillation (HCC) 06/13/2011   Coronary atherosclerosis due to calcified coronary lesion 06/13/2011   Chronic diarrhea 06/13/2011    Past Surgical History:  Procedure Laterality Date   ABDOMINAL HYSTERECTOMY  1980's   ABDOMINAL SURGERY     for villous polyp,,,many years ago   APPENDECTOMY  1940's   ASCAD, s/p PTCA  11/28/2005   MID LESION    BREAST BIOPSY Left 1970's   CARPAL TUNNEL RELEASE  Right 12/13/2015   Procedure: CARPAL TUNNEL RELEASE;  Surgeon: Juanell Fairly, MD;  Location: [facility] ORS;  Service: Orthopedics;  Laterality: Right;   COLECTOMY  2015   INTRAMEDULLARY (IM) NAIL INTERTROCHANTERIC Left 06/11/2019   Procedure: INTRAMEDULLARY (IM) NAIL INTERTROCHANTRIC;  Surgeon: Deeann Saint, MD;  Location: [facility] ORS;  Service: Orthopedics;  Laterality: Left;   IR CT HEAD LTD  05/01/2022   IR PERCUTANEOUS ART THROMBECTOMY/INFUSION INTRACRANIAL INC DIAG ANGIO  05/01/2022   IR US GUIDE VASC ACCESS RIGHT  05/01/2022   KYPHOPLASTY N/A 11/15/2020   Procedure: Nicki Reaper;  Surgeon: Kennedy Bucker, MD;  Location: [facility] ORS;  Service: Orthopedics;  Laterality: N/A;   PACEMAKER PLACEMENT     radation     for uterine cance   RADIOLOGY WITH ANESTHESIA N/A 05/01/2022   Procedure: IR WITH ANESTHESIA;  Surgeon: Radiologist, Medication, MD;  Location: [facility] OR;  Service: Radiology;  Laterality: N/A;   REFRACTIVE SURGERY     for bilateral glaumoma   SACROPLASTY N/A 11/15/2020   Procedure: SACROPLASTY;  Surgeon: Kennedy Bucker, MD;  Location: [facility] ORS;  Service: Orthopedics;  Laterality: N/A;   TONSILLECTOMY  1936    Prior to Admission medications   Medication  Sig Start Date End Date Taking? Authorizing Provider  acetaminophen (TYLENOL) 325 MG tablet Take 2 tablets (650 mg total) by mouth every 4 (four) hours as needed for mild pain (or temp > 37.5 C (99.5 F)). 08/30/22   Lonia Blood, MD  apixaban (ELIQUIS) 2.5 MG TABS tablet Take 1 tablet (2.5 mg total) by mouth 2 (two) times daily. 08/30/22   Lonia Blood, MD  clonazePAM (KLONOPIN) 0.5 MG disintegrating tablet Take 1 tablet (0.5 mg total) by mouth as needed for seizure (seizure lasting over 2 minutes). 08/30/22   Lonia Blood, MD  loperamide (IMODIUM) 2 MG capsule Take 1 capsule (2 mg total) by mouth as needed for diarrhea or loose stools. 08/30/22   Lonia Blood, MD  melatonin 3 MG TABS tablet Take 1 tablet (3 mg total) by  mouth at bedtime. 08/30/22   Lonia Blood, MD  metoprolol tartrate (LOPRESSOR) 25 MG tablet Take 0.5 tablets (12.5 mg total) by mouth 2 (two) times daily. 08/30/22   Lonia Blood, MD  OXcarbazepine (TRILEPTAL) 150 MG tablet Take 1 tablet (150 mg total) by mouth 2 (two) times daily. 08/30/22   Lonia Blood, MD  potassium chloride (KLOR-CON) 20 MEQ packet Take 20 mEq by mouth daily. 08/30/22   Lonia Blood, MD    Allergies Baycol [cerivastatin sodium], Cardizem [diltiazem hcl], Crestor [rosuvastatin calcium], Cymbalta [duloxetine hcl], Dronedarone, Metoprolol tartrate, Omeprazole, Pravachol, Statins, Vioxx [rofecoxib], and Zocor [simvastatin]  Family History  Problem Relation Age of Onset   Coronary artery disease Father    Kidney disease Father    Cancer Sister    Diabetes Other    Cancer Mother        COLON   Bladder Cancer Neg Hx    Breast cancer Neg Hx     Social History Social History   Tobacco Use   Smoking status: Former   Smokeless tobacco: Never   Tobacco comments:    quit 45 years ago  Vaping Use   Vaping Use: Never used  Substance Use Topics   Alcohol use: Yes    Alcohol/week: 0.0 standard drinks of alcohol    Comment: Rarely, 1-2 times a year   Drug use: No     ____________________________________________   PHYSICAL EXAM:  VITAL SIGNS: ED Triage Vitals  Enc Vitals Group     BP 10/13/22 1500 103/76     Pulse Rate 10/13/22 1453 (!) 104     Resp 10/13/22 1453 20     Temp 10/13/22 1453 97.8 F (36.6 C)     Temp Source 10/13/22 1453 Axillary     SpO2 10/13/22 1453 90 %     Weight --      Height --      Head Circumference --      Peak Flow --      Pain Score --      Pain Loc --      Pain Edu? --      Excl. in GC? --     Constitutional: Lethargic.  Patient is not currently following [employer].  Has normal pupillary responses.  The right pupil is noted to be just slightly smaller than the left but reactive.  Aids advised that  she has some sort of a abnormality after a previous injury I think that is chronic Eyes: Conjunctivae are normal. Head: Atraumatic. Nose: No congestion/rhinnorhea. Mouth/Throat: Mucous membranes are slightly dry. Neck: No stridor.  Cardiovascular: Normal rate, irregular rhythm. Grossly normal  heart sounds.  Good peripheral circulation. Respiratory: Normal respiratory effort.  No retractions. Lungs CTAB except for some central rhonchi noted with occasional cough. Gastrointestinal: Soft and nontender. No distention. Musculoskeletal: No lower extremity tenderness nor edema. Neurologic: Lethargic.  Withdraws to discomfort in the upper right side as well as right lower, also on the left slightly she has very slight amount of rhythmic twitching like activity.  There is no lipsmacking.  No biting of the tongue.  No incontinence. Skin:  Skin is warm, dry and intact. No rash noted. Psychiatric: Mood and affect are sedate  ____________________________________________   LABS (all labs ordered are listed, but only abnormal results are displayed)  Labs Reviewed  CBC - Abnormal; Notable for the following components:      Result Value   WBC 10.8 (*)    All other components within normal limits  BASIC METABOLIC PANEL - Abnormal; Notable for the following components:   Chloride 94 (*)    Glucose, Bld 153 (*)    Calcium 8.8 (*)    Anion gap 16 (*)    All other components within normal limits  PROTIME-INR - Abnormal; Notable for the following components:   Prothrombin Time 15.8 (*)    All other components within normal limits  LACTIC ACID, PLASMA - Abnormal; Notable for the following components:   Lactic Acid, Venous 3.3 (*)    All other components within normal limits  MAGNESIUM - Abnormal; Notable for the following components:   Magnesium 1.6 (*)    All other components within normal limits  CBG MONITORING, ED - Abnormal; Notable for the following components:   Glucose-Capillary 137 (*)     All other components within normal limits  CULTURE, BLOOD (ROUTINE X 2)  CULTURE, BLOOD (ROUTINE X 2)  AMMONIA  URINALYSIS, ROUTINE W REFLEX MICROSCOPIC  MISC [facility] TEST (SEND OUT)  LACTIC ACID, PLASMA  CBG MONITORING, ED  CBG MONITORING, ED  CBG MONITORING, ED  CBG MONITORING, ED  CBG MONITORING, ED   ____________________________________________  EKG  And interpreted by me at 1545 heart rate 85 QRS 140 QTc approximately 600 in the setting of right bundle branch block.  Mild nonspecific T wave abnormality.  Probable A-fib ____________________________________________  RADIOLOGY  CT head interpreted by me at 3:32 PM, negative for acute gross intracranial hemorrhage.  There is evidence of large previous right-sided stroke but I do not see any evidence of an acute hemorrhage  DG Chest Portable 1 View  Result Date: 10/13/2022 CLINICAL DATA:  Cough, seizure EXAM: PORTABLE CHEST 1 VIEW COMPARISON:  05/18/2022 FINDINGS: Cardiomegaly with left chest multi lead pacer. Small layering bilateral pleural effusions. Probable emphysema. No acute airspace opacity. Chronic fracture deformity of the proximal left humerus. IMPRESSION: 1. Cardiomegaly with small layering bilateral pleural effusions. No acute airspace opacity. 2. Probable emphysema. 3. Chronic fracture deformity of the proximal left humerus. Electronically Signed   By: Jearld Lesch M.D.   On: 10/13/2022 15:59   CT Head Wo Contrast  Result Date: 10/13/2022 CLINICAL DATA:  Possible seizure. EXAM: CT HEAD WITHOUT CONTRAST TECHNIQUE: Contiguous axial images were obtained from the base of the skull through the vertex without intravenous contrast. RADIATION DOSE REDUCTION: This exam was performed according to the departmental dose-optimization program which includes automated exposure control, adjustment of the mA and/or kV according to patient size and/or use of iterative reconstruction technique. COMPARISON:  CT head dated August 23, 2022.  FINDINGS: Brain: No evidence of acute infarction, hemorrhage, hydrocephalus, extra-axial collection or  mass lesion/mass effect. Large chronic right sided infarct involving the MCA and PCA territories again noted. Old right cerebellar infarct again noted. Stable atrophy and chronic microvascular ischemic changes. Vascular: Calcified atherosclerosis at the skull base. No hyperdense vessel. Skull: Normal. Negative for fracture or focal lesion. Sinuses/Orbits: No acute finding. Other: None. IMPRESSION: 1. No acute intracranial abnormality. Unchanged old right cerebral and cerebellar infarcts. Electronically Signed   By: Obie Dredge M.D.   On: 10/13/2022 15:54    ____________________________________________   PROCEDURES  Procedure(s) performed: None  Procedures  Critical Care performed: Yes, see critical care note(s)  CRITICAL CARE Performed by: Sharyn Creamer   Total critical care time: 35 minutes  Critical care time was exclusive of separately billable procedures and treating other patients.  Critical care was necessary to treat or prevent imminent or life-threatening deterioration.  Critical care was time spent personally by me on the following activities: development of treatment plan with patient and/or surrogate as well as nursing, discussions with consultants, evaluation of patient's response to treatment, examination of patient, obtaining history from patient or surrogate, ordering and performing treatments and interventions, ordering and review of laboratory studies, ordering and review of radiographic studies, pulse oximetry and re-evaluation of patient's condition.  Patient presenting with potential status epilepticus with left-sided twitching activity and a history of seizures provoked by UTI in the past.  She has received a dose of IV lorazepam as well as have ordered a Keppra load which she is previously treated with but switched to carbamazepine as an outpatient.  I think given her  active potential seizure-like activity dosing with Keppra at this time is indicated ____________________________________________   INITIAL IMPRESSION / ASSESSMENT AND PLAN / ED COURSE  Pertinent labs & imaging results that were available during my care of the patient were reviewed by me and considered in my medical decision making (see chart for details).   Differential diagnosis includes acute intracranial hemorrhage, new stroke, hydrocephalus, infection, metabolic abnormality, recrudescence like symptomatology or provoked seizure, etc.  The differential diagnosis quite broad.  Plan to proceed with treatment consistent with the patient's goals of care for which she is DNR/DNI.  Have ordered Keppra load.  Awaiting further workup at this time.  I have placed a consult with our neurologist Dr. Iver Nestle at 335pm  Clinical Course as of 10/13/22 1732  Mon Oct 13, 2022  1536 CBC interpreted as mild leukocytosis. [MQ]    Clinical Course User Index [MQ] Sharyn Creamer, MD   After discussion with neurologist, decision made with consultation with the patient's daughter who is her power of attorney medical to transfer to [facility] for ability to provide continuous EEG assessment and neurology consultation.  The patient is unable to consent to transfer herself but her daughter does so for her.  ----------------------------------------- 5:30 PM on 10/13/2022 ----------------------------------------- Patient stable for transfer.  Code sepsis was activated based on elevated white count and a suspicion of possible infection.  To my look, some slight concern for possible right lower lobe infiltrative process on chest x-ray the radiologist over read this as no acute.  Awaiting urinalysis, discussed with nursing obtaining In-N-Out, but given the concern for sepsis and wish to not delay antibiotic, antibiotic has been given  Patient is elevated lactic acid.  No hypotension.  No features highly suggestive of shock  lactate less than 4  Patient has been accepted in transfer via Dr. Jonah Blue who initially consulted with me via [facility], and then formally accepted the patient via  [facility] chat  Vitals:   10/13/22 1600 10/13/22 1630  BP: 126/64 (!) 144/57  Pulse: 85 89  Resp:    Temp:    SpO2: 100% 98%    ____________________________________________   FINAL CLINICAL IMPRESSION(S) / ED DIAGNOSES  Final diagnoses:  Seizure-like activity (HCC)        Note:  This document was prepared using Dragon voice recognition software and may include unintentional dictation errors       Sharyn Creamer, MD 10/13/22 1732

## 2022-10-13 NOTE — Sepsis Progress Note (Signed)
Elink monitoring for the code sepsis protocol.  

## 2022-10-13 NOTE — Progress Notes (Signed)
CODE SEPSIS - PHARMACY COMMUNICATION  **Broad Spectrum Antibiotics should be administered within 1 hour of Sepsis diagnosis**  Time Code Sepsis Called/Page Received: 1606  Antibiotics Ordered: Initially consults received for vancomycin and cefepime, which were discontinued by provider, with consult for meropenem. Choice of antibiotic ultimately decided to be Zosyn.  Time of 1st antibiotic administration: 1640  Additional action taken by pharmacy: none  If necessary, Name of Provider/Nurse Contacted: n/a    Elliot Gurney, PharmD, BCPS Clinical Pharmacist  10/13/2022 4:51 PM

## 2022-10-13 NOTE — Sepsis Progress Note (Signed)
Notified provider and bedside nurse of need to order and draw repeat lactic acid @ 1742.

## 2022-10-13 NOTE — ED Provider Notes (Signed)
Patient is resting, [place].  Even unlabored respirations.  Remains somnolent.  No further twitching or seizure-like activity noted.  Updated caretakers at the bedside who are understanding of the plan to transfer to [facility] as well   Sharyn Creamer, MD 10/13/22 [phone]

## 2022-10-14 ENCOUNTER — Inpatient Hospital Stay (HOSPITAL_COMMUNITY)

## 2022-10-14 ENCOUNTER — Encounter (HOSPITAL_COMMUNITY): Payer: Self-pay | Admitting: Internal Medicine

## 2022-10-14 DIAGNOSIS — R569 Unspecified convulsions: Secondary | ICD-10-CM | POA: Diagnosis not present

## 2022-10-14 LAB — BASIC METABOLIC PANEL
Anion gap: 10 (ref 5–15)
Anion gap: 11 (ref 5–15)
BUN: 10 mg/dL (ref 8–23)
BUN: 10 mg/dL (ref 8–23)
CO2: 31 mmol/L (ref 22–32)
CO2: 24 mmol/L (ref 22–32)
Calcium: 8 mg/dL — ABNORMAL LOW (ref 8.9–10.3)
Calcium: 8.2 mg/dL — ABNORMAL LOW (ref 8.9–10.3)
Chloride: 97 mmol/L — ABNORMAL LOW (ref 98–111)
Chloride: 100 mmol/L (ref 98–111)
Creatinine, Ser: 0.7 mg/dL (ref 0.44–1.00)
Creatinine, Ser: 0.73 mg/dL (ref 0.44–1.00)
GFR, Estimated: >60 mL/min (ref >60)
GFR, Estimated: >60 mL/min (ref >60)
Glucose, Bld: 107 mg/dL — ABNORMAL HIGH (ref 70–99)
Glucose, Bld: 101 mg/dL — ABNORMAL HIGH (ref 70–99)
Potassium: 2.4 mmol/L — CL (ref 3.5–5.1)
Potassium: 5.1 mmol/L (ref 3.5–5.1)
Sodium: 138 mmol/L (ref 135–145)
Sodium: 135 mmol/L (ref 135–145)

## 2022-10-14 LAB — GLUCOSE, CAPILLARY
Glucose-Capillary: 91 mg/dL (ref 70–99)
Glucose-Capillary: 91 mg/dL (ref 70–99)
Glucose-Capillary: 92 mg/dL (ref 70–99)

## 2022-10-14 LAB — CBC
HCT: 35.9 % — ABNORMAL LOW (ref 36.0–46.0)
Hemoglobin: 12.2 g/dL (ref 12.0–15.0)
MCH: 31.5 pg (ref 26.0–34.0)
MCHC: 34 g/dL (ref 30.0–36.0)
MCV: 92.8 fL (ref 80.0–100.0)
Platelets: 152 10*3/uL (ref 150–400)
RBC: 3.87 MIL/uL (ref 3.87–5.11)
RDW: 12.7 % (ref 11.5–15.5)
WBC: 7.7 10*3/uL (ref 4.0–10.5)
nRBC: 0 % (ref 0.0–0.2)

## 2022-10-14 LAB — PHOSPHORUS: Phosphorus: 3.8 mg/dL (ref 2.5–4.6)

## 2022-10-14 LAB — CULTURE, BLOOD (ROUTINE X 2): Culture: NO GROWTH

## 2022-10-14 LAB — MAGNESIUM: Magnesium: 1.7 mg/dL (ref 1.7–2.4)

## 2022-10-14 LAB — LACTIC ACID, PLASMA: Lactic Acid, Venous: 1.8 mmol/L (ref 0.5–1.9)

## 2022-10-14 MED ORDER — POTASSIUM CHLORIDE 10 MEQ/100ML IV SOLN
10.0000 meq | INTRAVENOUS | Status: AC
  Administered 2022-10-14 (×2): 10 meq via INTRAVENOUS
  Filled 2022-10-14 (×2): qty 100

## 2022-10-14 MED ORDER — POLYVINYL ALCOHOL 1.4 % OP SOLN: 1.0000 [drp] | OPHTHALMIC | Status: DC | PRN

## 2022-10-14 MED ORDER — POTASSIUM CHLORIDE 2 MEQ/ML IV SOLN
INTRAVENOUS | Status: DC
  Filled 2022-10-14: qty 1000

## 2022-10-14 NOTE — Hospital Course (Addendum)
89 y.o.f w/ complex medical history including baseline dementia, permanent atrial fibrillation on Eliquis, sick sinus syndrome status post pacemaker placement, type 2 diabetes, hyperlipidemia, hypertension, coronary artery disease, chronic anxiety/depression, ischemic stroke in December 2023 with residual left sided weakness, recurrent urinary tract infections, who initially presented to [facility] ED from independent living facility for hospice care, due to shaking, seizure-like activities and seizure was suspected etiology given and EMS activated and brought to the ED and subsequently transferred from [facility] to [facility] for further neurology evaluation and LTM EEG UA was positive for pyuria, with tachycardia of 135 and lactic acidosis 3.3 code sepsis was called to the ED.  Peripheral blood cultures x 2 were obtained and the patient was started on Zosyn empirically. At the time of  admission patient was sedated and does not appear in distress. ED Course: Temperature 98.3.  BP 123/49, pulse 74, respiratory 16, O2 saturation 100% on 2 L.  Lab studies markable for lactic acid 3.3, repeat 2.3.  Serum glucose 153, magnesium 1.6, anion gap 16, serum bicarb 26.  WBC 10.8. Patient underwent LTM EEG and seen by neurology- Continuous slow, right hemisphere; Intermittent slow, generalized. This study is suggestive of cortical dysfunction arising from right hemisphere, maximal right parietal region likely due to underlying stroke. Additionally there is mild diffuse encephalopathy. No seizures  were seen throughout the recording. - LTM EEG was discontinued 5/28.  Neurology recommending Keppra, Trileptal to treat underlying infection and resume home Klonopin and neurology signed off

## 2022-10-14 NOTE — Progress Notes (Signed)
LTM EEG hooked up and running - no initial skin breakdown - push button tested - [facility] monitoring.  

## 2022-10-14 NOTE — Progress Notes (Signed)
LTM d/c completed. Skin breakdown on F3. [facility] notified.

## 2022-10-14 NOTE — Progress Notes (Signed)
Palliative:  Reviewed chart. Kara [name] is active with [facility] hospice services. I discussed with [name], [facility] hospice liaison. [name] is speaking with family and reports that goals are for conservative treatment. Plans for return home with hospice and caregiver support vs hospice facility. I will continue to follow for needs.   No charge  Yong Channel, NP Palliative Medicine Team Pager [phone] (Please see [email or web address] for schedule) Team Phone [phone]

## 2022-10-14 NOTE — TOC Initial Note (Signed)
Transition of Care [facility]) - Initial/Assessment Note    Patient Details  Name: Kara [name] MRN: [id number] Date of Birth: 16-Jan-1923  Transition of Care [facility]) CM/SW Contact:    Ronny Bacon, RN Phone Number: 10/14/2022, 1:41 PM  Clinical Narrative:  Sherron Monday with caregiver, Laurita Quint, at bedside. Patient stays at [facility] in [place]. Caregiver stays with patient 7-10 hours a day Monday through Saturday and checks on patient Sundays by phone. Per Caregiver a CNA comes from an agency called "[employer] [facility]," but is there when the caregiver is there.  DME equipment at home; steady, WC, Rollator, Walker, Shower chair, and BSC. Patient gets bed baths at this time due to unable to stand in shower. Patient does not get home oxygen services at this time, currently on 3 L [place]. Patient transportation home would have to be arranged by ambulance.                Expected Discharge Plan: Home w Hospice Care Barriers to Discharge: Continued Medical Work up   Patient Goals and CMS Choice Patient states their goals for this hospitalization and ongoing recovery are:: patient unable to state goal,          Expected Discharge Plan and Services   Discharge Planning Services: CM Consult (Pt not connected to home oxygen agency, currently on 3 L [place]) Post Acute Care Choice: Hospice Living arrangements for the past 2 months: Independent Living Facility                                      Prior Living Arrangements/Services Living arrangements for the past 2 months: Independent Living Facility Lives with:: Facility Resident Patient language and need for interpreter reviewed:: Yes        Need for Family Participation in Patient Care: Yes (Comment) Care giver support system in place?: Yes (comment) Current home services: DME (steady, WC, Rollator, Walker, Hospital bed, Shower chair, [facility]) Criminal Activity/Legal Involvement Pertinent to Current Situation/Hospitalization: No - Comment as  needed  Activities of Daily Living Home Assistive Devices/Equipment: None ADL Screening (condition at time of admission) Patient's cognitive ability adequate to safely complete daily activities?: Yes Is the patient deaf or have difficulty hearing?: No Does the patient have difficulty seeing, even when wearing glasses/contacts?: Yes Does the patient have difficulty concentrating, remembering, or making decisions?: Yes Patient able to express need for assistance with ADLs?: Yes Does the patient have difficulty dressing or bathing?: Yes Independently performs ADLs?: No Communication: Independent Is this a change from baseline?: Pre-admission baseline Dressing (OT): Needs assistance Is this a change from baseline?: Pre-admission baseline Grooming: Needs assistance Is this a change from baseline?: Pre-admission baseline Feeding: Needs assistance Is this a change from baseline?: Pre-admission baseline Bathing: Needs assistance Is this a change from baseline?: Pre-admission baseline Toileting: Needs assistance Is this a change from baseline?: Pre-admission baseline In/Out Bed: Needs assistance Is this a change from baseline?: Pre-admission baseline Walks in Home: Dependent Is this a change from baseline?: Pre-admission baseline Does the patient have difficulty walking or climbing stairs?: Yes Weakness of Legs: Both Weakness of Arms/Hands: Both  Permission Sought/Granted                  Emotional Assessment Appearance:: Appears stated age Attitude/Demeanor/Rapport: Unable to Assess Affect (typically observed): Unable to Assess   Alcohol / Substance Use: Not Applicable Psych Involvement: No (comment)  Admission diagnosis:  Seizure (  HCC) [R56.9] Patient Active Problem List   Diagnosis Date Noted   Seizure-like activity (HCC) 10/13/2022   Malnutrition of moderate degree (HCC) 08/26/2022   Seizure (HCC) 08/26/2022   Hypomagnesemia 08/26/2022   Depression 08/26/2022   GERD  (gastroesophageal reflux disease) 08/26/2022   Partial seizure (HCC) 08/23/2022   DM2 (diabetes mellitus, type 2) (HCC) 08/23/2022   Magnesium deficiency 08/23/2022   Failure to thrive in adult 08/23/2022   Secondary hypercoagulable state (HCC) 08/23/2022   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   Ischemic stroke (HCC) 05/01/2022   Vulvar irritation 04/22/2022   Lower abdominal pain 08/14/2021   Pain in left upper arm 08/14/2021   Dystrophic nail 08/14/2021   Urinary frequency 02/16/2021   Back pain 11/14/2020   Compression fx, lumbar spine, sequela 11/13/2020   Lumbar spinal stenosis 11/08/2020   Traumatic hemorrhage of cerebrum (HCC) 07/31/2020   History of recent fall 07/30/2020   Intraparenchymal hemorrhage of brain (HCC) 07/13/2020   Syncope and collapse 07/13/2020   Hypokalemia 07/13/2020   Atrial fibrillation, chronic (HCC) 07/13/2020   DNR (do not resuscitate) 07/13/2020   S/P placement of cardiac pacemaker 07/03/2020   Myalgia due to statin 07/03/2020   Aortic atherosclerosis (HCC) 07/03/2020   Persistent atrial fibrillation (HCC) 08/12/2019   Fracture of neck of femur (HCC) 06/27/2019   Displaced intertrochanteric fracture of left femur, sequela 06/25/2019   Age-related osteoporosis with current pathological fracture with routine healing 06/15/2019   Educated about COVID-19 virus infection 10/12/2018   Dysuria 02/02/2018   Edema of lower extremity 11/24/2017   Venous insufficiency of both lower extremities 11/24/2016   Essential hypertension 11/11/2016   Peripheral artery disease (HCC) 11/11/2016   Chronic pulmonary hypertension (HCC) 10/20/2016   Leg swelling 06/17/2016   Type 2 diabetes mellitus with diabetic neuropathy, unspecified (HCC) 06/17/2016   Urinary incontinence, urge 12/27/2015   Atrophic vaginitis 09/26/2015   Severe tricuspid valve insufficiency 07/04/2015   Carpal tunnel syndrome of right wrist 05/29/2015   Chronic fatigue 05/01/2015    E. coli UTI 11/21/2014   Urethral caruncle 11/21/2014   Acute cystitis 10/26/2014   Mitral valve regurgitation 05/09/2014   Malaise and fatigue 03/09/2014   SSS (sick sinus syndrome) (HCC) 10/11/2013   CAD (coronary artery disease) 10/11/2013   Osteoarthritis 03/08/2012   Screening for breast cancer 03/08/2012   Pernicious anemia 02/04/2012   History of colectomy 02/04/2012   Vitamin D deficiency 07/30/2011   Acquired thrombophilia (HCC) 06/13/2011   Urinary tract infection 06/13/2011   Permanent atrial fibrillation (HCC) 06/13/2011   Coronary atherosclerosis due to calcified coronary lesion 06/13/2011   Chronic diarrhea 06/13/2011   PCP:  Lauro Regulus, MD Pharmacy:   [facility] - [place], [place] - [address] [place] [address] Phone: [phone] Fax: [phone]  [facility] [id number] [place], [place] - [address] [place] [address] Phone: [phone] Fax: [phone]  [facility] [address] [place] [address] Phone: [phone] Fax: [phone]  [facility] - [place], [place] - [address] [place] [address] Phone: [phone] Fax: [phone]     Social Determinants of Health (SDOH) Social History: SDOH Screenings   Food Insecurity: No Food Insecurity (10/13/2022)  Housing: Low Risk  (10/13/2022)  Transportation Needs: No Transportation Needs (10/13/2022)  Utilities: Not At Risk (10/13/2022)  Depression (PHQ2-9): Low Risk  (04/22/2022)  Financial Resource  Strain: Low Risk  (03/12/2022)  Physical Activity: Insufficiently Active (01/10/2022)  Social Connections: Moderately Isolated (03/12/2022)  Stress: No Stress Concern Present (03/12/2022)  Tobacco Use: Medium Risk (10/14/2022)   SDOH Interventions:     Readmission Risk Interventions    10/14/2022    1:40 PM  Readmission Risk Prevention Plan   Transportation Screening Complete  PCP or Specialist Appt within 3-5 Days Complete  HRI or Home Care Consult Complete  Social Work Consult for Recovery Care Planning/Counseling Complete  Palliative Care Screening Complete  Medication Review Oceanographer) Complete

## 2022-10-14 NOTE — Procedures (Addendum)
Patient Name: [name]  MRN: [id number]  Epilepsy Attending: Charlsie Quest  Referring Physician/Provider: Milon Dikes, MD  Duration: 10/14/2022 0013 to 10/14/2022 1504   Patient history: 89 yo F with right MCA and PCA territory stroke presented with rhythmic twitching of the RIGHT side. EEG to evaluate for seizure.   Level of alertness: Awake, asleep   AEDs during EEG study: LEV   Technical aspects: This EEG study was done with scalp electrodes positioned according to the 10-20 International system of electrode placement. Electrical activity was reviewed with band pass filter of 1-70Hz , sensitivity of 7 uV/mm, display speed of 85mm/sec with a 60Hz  notched filter applied as appropriate. EEG data were recorded continuously and digitally stored.  Video monitoring was available and reviewed as appropriate.   Description: The posterior dominant rhythm consists of 8-9 Hz activity of moderate voltage (25-35 uV) seen predominantly in posterior head regions, asymmetric( right<left) and reactive to eye opening and eye closing. Sleep was characterized by vertex waves, sleep spindles (12 to 14 Hz), maximal frontocentral region, asymmetric (right<left), POSTs. EEG showed continuous low amplitude 2-3Hz  delta slowing in right hemisphere. Intermittent 3-6hz  theta-delta slowing was also noted. Photic driving was not seen during photic stimulation. Hyperventilation was not performed.      ABNORMALITY - Continuous slow, right hemisphere - Intermittent slow, generalized   IMPRESSION: This study is suggestive of cortical dysfunction arising from right hemisphere, maximal right parietal region likely due to underlying stroke. Additionally there is mild diffuse encephalopathy. No seizures  were seen throughout the recording.   Kara [name]

## 2022-10-14 NOTE — Progress Notes (Signed)
EEG maintenance performed. No noted skin break down.

## 2022-10-14 NOTE — Progress Notes (Signed)
[facility] [id number] [facility]) Hospitalized Hospice Patient   [name] is a current [facility] hospice patient with a terminal diagnosis of other signs and symptoms involving cognitive functions following cerebrovascular disease.  Patient has secondary diagnoses of vascular dementia. Patient was transported to the hospital 5.27 after home caregiver observed patient to be "shaking" and believed she might be having a seizure.  Patient was originally taken to [facility] via EMS but was later brought to [facility] and admitted to the neuro unit for continuous EEG.  Patient's code status with ACC is DNR.  Per Dr. Patric Dykes with [facility], this is a hospice related admission.  Rounded in room with patient, private home caregiver is at bedside.  Caregiver states patient has not eaten well for the past 2 days and is stating she "is tired."  Caregiver tells me patient was clear that she did not want any type of aggressive treatments but if something easily treatable is identified, like a UTI, she would want treatment for that.  Patient does not wake up during our conversation and does not wake up when I call her name or place my hand on her shoulder.  The caregiver states the MD has not yet been by.  Plan is for caregiver to call the patient's daughter when MD rounds so she can hear conversation as she lives in [place].  No needs identified at this time.   Vital Signs- 97.5/64/20    152/66   97% Room Air I&O- 623/425 Abnormal Labs- HCT 35.9, Potassium 2.4, Chloride 97, Calcium 8.0, Glucose 107 Diagnostics-  Continuous EEG: ABNORMALITY - Continuous slow, right hemisphere - Intermittent slow, generalized   IMPRESSION: This study is suggestive of cortical dysfunction arising from right hemisphere, maximal right parietal region likely due to underlying stroke. Additionally there is mild diffuse encephalopathy. No seizures  were seen throughout the recording.  Chest xray: Narrative & Impression  CLINICAL DATA:   Cough, seizure   EXAM: PORTABLE CHEST 1 VIEW   COMPARISON:  05/18/2022   FINDINGS: Cardiomegaly with left chest multi lead pacer. Small layering bilateral pleural effusions. Probable emphysema. No acute airspace opacity. Chronic fracture deformity of the proximal left humerus.   IMPRESSION: 1. Cardiomegaly with small layering bilateral pleural effusions. No acute airspace opacity. 2. Probable emphysema. 3. Chronic fracture deformity of the proximal left humerus.   CT Head: CLINICAL DATA:  Possible seizure.   EXAM: CT HEAD WITHOUT CONTRAST   TECHNIQUE: Contiguous axial images were obtained from the base of the skull through the vertex without intravenous contrast.   RADIATION DOSE REDUCTION: This exam was performed according to the departmental dose-optimization program which includes automated exposure control, adjustment of the mA and/or kV according to patient size and/or use of iterative reconstruction technique.   COMPARISON:  CT head dated August 23, 2022.   FINDINGS: Brain: No evidence of acute infarction, hemorrhage, hydrocephalus, extra-axial collection or mass lesion/mass effect. Large chronic right sided infarct involving the MCA and PCA territories again noted. Old right cerebellar infarct again noted. Stable atrophy and chronic microvascular ischemic changes.   Vascular: Calcified atherosclerosis at the skull base. No hyperdense vessel.   Skull: Normal. Negative for fracture or focal lesion.   Sinuses/Orbits: No acute finding.   Other: None.   IMPRESSION: 1. No acute intracranial abnormality. Unchanged old right cerebral and cerebellar infarcts.    IV/PRN Meds- None Problem List per Hospitalist: Assessment and Plan: Principal Problem:   Seizure (HCC)   Seizure-like activity Encephalopathy acute metabolic:  Neurology has been consulted continue LTM EEG, seizure precaution, continue IV Keppra.  Caregiver at the bedside, nursing informs that  patient was much more alert awake able to take the pill and able to answer currently resting on EEG   Sepsis with presumed UTI: With tachycardia lactic acidosis placed on empiric Zosyn follow-up culture data to de-escalate antibiotics.  Lactic acidosis improved to 1.8   Prolonged Qtc-monitor   Hypokalemia: Replete aggressively   T2DM on SSI Permanent A-fib on Eliquis Chronic anxiety/depression:stable   Goals of care currently DNR currently getting hospice services palliative care has been consulted.  Continue on supportive care and management of seizure as above, with plan to return back with hospice services.    Discharge Planning- Ongoing, plan for home with hospice once stabilized.  Family Contact- Private caregiver at bedside to relay information to daughter.  IDT: updated ACC team Goals of Care: DNR, ongoing testing  Thank you for the opportunity to participate in this patient's care, please don't hesitate to call for any hospice related questions or concerns.   [name], RN, BSN [facility] Liaison [phone]

## 2022-10-14 NOTE — Progress Notes (Addendum)
PROGRESS NOTE ALEITA BRUNING  MVH:[id number] DOB: 03/23/1923 DOA: 10/13/2022 PCP: Lauro Regulus, MD  Brief Narrative/Hospital Course: 89 y.o.f w/ complex medical history including baseline dementia, permanent atrial fibrillation on Eliquis, sick sinus syndrome status post pacemaker placement, type 2 diabetes, hyperlipidemia, hypertension, coronary artery disease, chronic anxiety/depression, ischemic stroke in December 2023 with residual left sided weakness, recurrent urinary tract infections, who initially presented to [facility] ED from independent living facility for hospice care, due to shaking, seizure-like activities and seizure was suspected etiology given and EMS activated and brought to the ED and subsequently transferred from [facility] to [facility] for further neurology evaluation and LTM EEG UA was positive for pyuria, with tachycardia of 135 and lactic acidosis 3.3 code sepsis was called to the ED.  Peripheral blood cultures x 2 were obtained and the patient was started on Zosyn empirically. At the time of  admission patient was sedated and does not appear in distress. ED Course: Temperature 98.3.  BP 123/49, pulse 74, respiratory 16, O2 saturation 100% on 2 L.  Lab studies markable for lactic acid 3.3, repeat 2.3.  Serum glucose 153, magnesium 1.6, anion gap 16, serum bicarb 26.  WBC 10.8.    Subjective: Seen and examined this morning.  Patient is in sleep with EEG hooked  caregiver at the bedside, nursing informs that patient was much more alert awake able to take the pill and able to tell her name   Assessment and Plan: Principal Problem:   Seizure (HCC)   Seizure-like activity Encephalopathy acute metabolic: Neurology has been consulted continue LTM EEG, seizure precaution, continue IV Keppra.  Caregiver at the bedside, nursing informs that patient was much more alert awake able to take the pill and able to answer currently resting on EEG  Sepsis with presumed UTI: With tachycardia  lactic acidosis placed on empiric Zosyn follow-up culture data to de-escalate antibiotics.  Lactic acidosis improved to 1.8  Prolonged Qtc-monitor  Hypokalemia: Replete aggressively  T2DM on SSI Permanent A-fib on Eliquis Chronic anxiety/depression:stable  Goals of care currently DNR currently getting hospice services palliative care has been consulted.  Continue on supportive care and management of seizure as above, with plan to return back with hospice services.  Pressure Injury 10/13/22 Buttocks Medial Stage 1 -  Intact skin with non-blanchable redness of a localized area usually over a bony prominence. reddness due to incontinence per caregiver (Active)  10/13/22   Location: Buttocks  Location Orientation: Medial  Staging: Stage 1 -  Intact skin with non-blanchable redness of a localized area usually over a bony prominence.  Wound Description (Comments): reddness due to incontinence per caregiver  Present on Admission: Yes  Dressing Type Foam - Lift dressing to assess site every shift 10/14/22 0029    DVT prophylaxis: apixaban (ELIQUIS) tablet 2.5 mg Start: 10/13/22 2200 Code Status:   Code Status: DNR Family Communication: plan of care discussed with patient's caregivef at bedside. Patient status is:  on[atient  because of seizure Level of care: Telemetry Medical   Dispo: The patient is from:ILF w/ hospice.             Anticipated disposition: ILF w/ hospice.  Objective: Vitals last 24 hrs: Vitals:   10/13/22 2000 10/13/22 2316 10/14/22 0333 10/14/22 0826  BP:  (!) 122/51 (!) 104/55 135/64  Pulse:  65 67 65  Resp: 20 19 17 19   Temp:  97.6 F (36.4 C) (!) 97.5 F (36.4 C) (!) 97.5 F (36.4 C)  TempSrc:  Oral  Oral Oral  SpO2:  95% 100% 100%  Weight:      Height:      Weight change:   Physical Examination: General exam: sleeping HEENT:Oral mucosa moist, Ear/Nose WNL grossly Respiratory system: bilaterally clear BS, no use of accessory muscle Cardiovascular system:  S1 & S2 +, No JVD. Gastrointestinal system: Abdomen soft,NT,ND, BS+ Nervous System:sleeping Extremities: LE edema neg,distal peripheral pulses palpable.  Skin: No rashes,no icterus. MSK: Normal muscle bulk,tone, power  Medications reviewed:  Scheduled Meds:  apixaban  2.5 mg Oral BID   insulin aspart  0-5 Units Subcutaneous QHS   insulin aspart  0-9 Units Subcutaneous TID WC   Continuous Infusions:  lactated ringers 1,000 mL with potassium chloride 40 mEq infusion 50 mL/hr at 10/14/22 0500   levETIRAcetam 500 mg (10/14/22 0953)   piperacillin-tazobactam (ZOSYN)  IV 3.375 g (10/14/22 0516)   potassium chloride 10 mEq (10/14/22 1111)   Diet Order             Diet heart healthy/carb modified Fluid consistency: Thin  Diet effective now                  Intake/Output Summary (Last 24 hours) at 10/14/2022 1139 Last data filed at 10/14/2022 0500 Gross per 24 hour  Intake 623.6 ml  Output 425 ml  Net 198.6 ml   Net IO Since Admission: 198.6 mL [10/14/22 1139]  Wt Readings from Last 3 Encounters:  10/13/22 45.7 kg  08/27/22 59.5 kg  08/23/22 49.7 kg     Unresulted Labs (From admission, onward)     Start     Ordered   10/13/22 2017  Urine Culture (for pregnant, neutropenic or urologic patients or patients with an indwelling urinary catheter)  (Urine Labs)  Once,   R       Question:  Indication  Answer:  Sepsis   10/13/22 2016          Data Reviewed: I have personally reviewed following labs and imaging studies CBC: Recent Labs  Lab 10/13/22 1512 10/14/22 0252  WBC 10.8* 7.7  HGB 14.8 12.2  HCT 43.1 35.9*  MCV 92.7 92.8  PLT 241 152   Basic Metabolic Panel: Recent Labs  Lab 10/13/22 1512 10/14/22 0252  NA 136 138  K 3.5 2.4*  CL 94* 97*  CO2 26 31  GLUCOSE 153* 107*  BUN 11 10  CREATININE 0.77 0.70  CALCIUM 8.8* 8.0*  MG 1.6* 1.7  PHOS  --  3.8   Recent Labs  Lab 10/13/22 1542  AMMONIA 29   Coagulation Profile: Recent Labs  Lab  10/13/22 1512  INR 1.2  CBG: Recent Labs  Lab 10/13/22 1554 10/13/22 2131 10/14/22 0600  GLUCAP 137* 113* 91   Recent Labs  Lab 10/13/22 1542 10/13/22 1713 10/13/22 2034 10/13/22 2337  LATICACIDVEN 3.3* 2.3* 2.1* 1.8    Recent Results (from the past 240 hour(s))  Blood culture (routine x 2)     Status: None (Preliminary result)   Collection Time: 10/13/22  3:59 PM   Specimen: BLOOD  Result Value Ref Range Status   Specimen Description BLOOD BLOOD LEFT WRIST  Final   Special Requests   Final    BOTTLES DRAWN AEROBIC AND ANAEROBIC Blood Culture results may not be optimal due to an inadequate volume of blood received in culture bottles   Culture   Final    NO GROWTH < 24 HOURS Performed at [facility], [address], [place], [place] [address]  Report Status PENDING  Incomplete  Blood culture (routine x 2)     Status: None (Preliminary result)   Collection Time: 10/13/22  4:04 PM   Specimen: BLOOD  Result Value Ref Range Status   Specimen Description BLOOD RIGHT ANTECUBITAL  Final   Special Requests   Final    BOTTLES DRAWN AEROBIC AND ANAEROBIC Blood Culture adequate volume   Culture   Final    NO GROWTH < 24 HOURS Performed at [facility], [address] [place], [place] [address]    Report Status PENDING  Incomplete    Antimicrobials: Anti-infectives (From admission, onward)    Start     Dose/Rate Route Frequency Ordered Stop   10/14/22 0100  piperacillin-tazobactam (ZOSYN) IVPB 3.375 g        3.375 g 12.5 mL/hr over 240 Minutes Intravenous Every 8 hours 10/13/22 2045        Culture/Microbiology    Component Value Date/Time   SDES BLOOD RIGHT ANTECUBITAL 10/13/2022 1604   SPECREQUEST  10/13/2022 1604    BOTTLES DRAWN AEROBIC AND ANAEROBIC Blood Culture adequate volume   CULT  10/13/2022 1604    NO GROWTH < 24 HOURS Performed at [facility], [address], [place], [place] [address]    REPTSTATUS PENDING 10/13/2022  1604    Radiology Studies: Overnight EEG with video  Result Date: 10/14/2022 Charlsie Quest, MD     10/14/2022  4:38 AM Patient Name: ROSALEA POFF MRN: [id number] Epilepsy Attending: Charlsie Quest Referring Physician/Provider: Milon Dikes, MD Duration: 10/14/2022 0013 to 10/14/2022 0430  Patient history: 89 yo F with right MCA and PCA territory stroke presented with rhythmic twitching of the RIGHT side. EEG to evaluate for seizure.  Level of alertness: Awake, asleep  AEDs during EEG study: LEV  Technical aspects: This EEG study was done with scalp electrodes positioned according to the 10-20 International system of electrode placement. Electrical activity was reviewed with band pass filter of 1-70Hz , sensitivity of 7 uV/mm, display speed of 68mm/sec with a 60Hz  notched filter applied as appropriate. EEG data were recorded continuously and digitally stored.  Video monitoring was available and reviewed as appropriate.  Description: The posterior dominant rhythm consists of 8-9 Hz activity of moderate voltage (25-35 uV) seen predominantly in posterior head regions, asymmetric( right<left) and reactive to eye opening and eye closing. Sleep was characterized by vertex waves, sleep spindles (12 to 14 Hz), maximal frontocentral region, asymmetric (right<left), POSTs. EEG showed continuous low amplitude 2-3Hz  delta slowing in right hemisphere. Intermittent 3-6hz  theta-delta slowing was also noted. Photic driving was not seen during photic stimulation. Hyperventilation was not performed.    ABNORMALITY - Continuous slow, right hemisphere - Intermittent slow, generalized  IMPRESSION: This study is suggestive of cortical dysfunction arising from right hemisphere, maximal right parietal region likely due to underlying stroke. Additionally there is mild diffuse encephalopathy. No seizures  were seen throughout the recording.  Charlsie Quest   DG Chest Portable 1 View  Result Date: 10/13/2022 CLINICAL DATA:   Cough, seizure EXAM: PORTABLE CHEST 1 VIEW COMPARISON:  05/18/2022 FINDINGS: Cardiomegaly with left chest multi lead pacer. Small layering bilateral pleural effusions. Probable emphysema. No acute airspace opacity. Chronic fracture deformity of the proximal left humerus. IMPRESSION: 1. Cardiomegaly with small layering bilateral pleural effusions. No acute airspace opacity. 2. Probable emphysema. 3. Chronic fracture deformity of the proximal left humerus. Electronically Signed   By: Jearld Lesch M.D.   On: 10/13/2022 15:59  CT Head Wo Contrast  Result Date: 10/13/2022 CLINICAL DATA:  Possible seizure. EXAM: CT HEAD WITHOUT CONTRAST TECHNIQUE: Contiguous axial images were obtained from the base of the skull through the vertex without intravenous contrast. RADIATION DOSE REDUCTION: This exam was performed according to the departmental dose-optimization program which includes automated exposure control, adjustment of the mA and/or kV according to patient size and/or use of iterative reconstruction technique. COMPARISON:  CT head dated August 23, 2022. FINDINGS: Brain: No evidence of acute infarction, hemorrhage, hydrocephalus, extra-axial collection or mass lesion/mass effect. Large chronic right sided infarct involving the MCA and PCA territories again noted. Old right cerebellar infarct again noted. Stable atrophy and chronic microvascular ischemic changes. Vascular: Calcified atherosclerosis at the skull base. No hyperdense vessel. Skull: Normal. Negative for fracture or focal lesion. Sinuses/Orbits: No acute finding. Other: None. IMPRESSION: 1. No acute intracranial abnormality. Unchanged old right cerebral and cerebellar infarcts. Electronically Signed   By: Obie Dredge M.D.   On: 10/13/2022 15:54     LOS: 1 day   Lanae Boast, MD [facility]  10/14/2022, 11:39 AM

## 2022-10-15 DIAGNOSIS — R569 Unspecified convulsions: Secondary | ICD-10-CM | POA: Diagnosis not present

## 2022-10-15 LAB — GLUCOSE, CAPILLARY
Glucose-Capillary: 116 mg/dL — ABNORMAL HIGH (ref 70–99)
Glucose-Capillary: 99 mg/dL (ref 70–99)
Glucose-Capillary: 113 mg/dL — ABNORMAL HIGH (ref 70–99)
Glucose-Capillary: 90 mg/dL (ref 70–99)
Glucose-Capillary: 77 mg/dL (ref 70–99)

## 2022-10-15 LAB — URINE CULTURE

## 2022-10-15 LAB — CULTURE, BLOOD (ROUTINE X 2): Special Requests: ADEQUATE

## 2022-10-15 MED ORDER — LORAZEPAM 2 MG/ML IJ SOLN
2.0000 mg | Freq: Once | INTRAMUSCULAR | Status: AC
  Administered 2022-10-15: 2 mg via INTRAVENOUS

## 2022-10-15 MED ORDER — ADULT MULTIVITAMIN W/MINERALS CH
1.0000 | ORAL_TABLET | Freq: Every day | ORAL | Status: DC
  Administered 2022-10-16: 1 via ORAL
  Filled 2022-10-15: qty 1

## 2022-10-15 MED ORDER — SODIUM CHLORIDE 0.9 % IV SOLN: INTRAVENOUS | Status: DC

## 2022-10-15 MED ORDER — OXCARBAZEPINE 150 MG PO TABS
150.0000 mg | ORAL_TABLET | Freq: Two times a day (BID) | ORAL | Status: DC
  Administered 2022-10-15 – 2022-10-16 (×3): 150 mg via ORAL
  Filled 2022-10-15 (×4): qty 1

## 2022-10-15 MED ORDER — LEVETIRACETAM IN NACL 1000 MG/100ML IV SOLN
1000.0000 mg | Freq: Once | INTRAVENOUS | Status: AC
  Administered 2022-10-15: 1000 mg via INTRAVENOUS
  Filled 2022-10-15: qty 100

## 2022-10-15 NOTE — Progress Notes (Addendum)
Called to bedside by caregiver for facial twitching. Upon assessment Left arm twitching as well. Patient still alert and responding to commands.Kc Attending MD at bedside. Am Keppra given and Trileptal added. Per MD will notify nuerology

## 2022-10-15 NOTE — Plan of Care (Deleted)
Discontinuing LTM EEG.   Electronically signed: Dr. Caryl Pina

## 2022-10-15 NOTE — Progress Notes (Signed)
PROGRESS NOTE VERONCIA [name]  ZOX:[id number] DOB: August 15, 1922 DOA: 10/13/2022 PCP: Lauro Regulus, MD  Brief Narrative/Hospital Course: 89 y.o.f w/ complex medical history including baseline dementia, permanent atrial fibrillation on Eliquis, sick sinus syndrome status post pacemaker placement, type 2 diabetes, hyperlipidemia, hypertension, coronary artery disease, chronic anxiety/depression, ischemic stroke in December 2023 with residual left sided weakness, recurrent urinary tract infections, who initially presented to [facility] ED from independent living facility for hospice care, due to shaking, seizure-like activities and seizure was suspected etiology given and EMS activated and brought to the ED and subsequently transferred from [facility] to [facility] for further neurology evaluation and LTM EEG UA was positive for pyuria, with tachycardia of 135 and lactic acidosis 3.3 code sepsis was called to the ED.  Peripheral blood cultures x 2 were obtained and the patient was started on Zosyn empirically. At the time of  admission patient was sedated and does not appear in distress. ED Course: Temperature 98.3.  BP 123/49, pulse 74, respiratory 16, O2 saturation 100% on 2 L.  Lab studies markable for lactic acid 3.3, repeat 2.3.  Serum glucose 153, magnesium 1.6, anion gap 16, serum bicarb 26.  WBC 10.8.    Subjective: Seen and examined Caregiver at bedside Rn as well Noticed some twitching on left face and wad dissipating we we talked she is more alert awake and following commands Dr Otelia Limes paged and informed abt the left facial twitching, b/l eye twitching-Resuming her trileptal, hold off on ativan to avoid sedation Neuro team in unit to assess  Assessment and Plan: Principal Problem:   Seizure (HCC)   Seizure-like activity Encephalopathy acute metabolic: Appreciate neurology input on board LTM EEG was discontinued however noticed to have some left facial twitching and was dissipating, discussed  with neurology resuming Trileptal continue current Keppra 500 twice daily, continue seizure precaution.  Appears much more alert awake communicative interactive hold off on sedation/Ativan unless absolutely needed for seizure .Neuro notified this morning and on the way to see the patient.  Sepsis with presumed WJX:BJYN tachycardia lactic acidosis placed on empiric Zosyn-urine culture ending reintubated.  Recent Labs  Lab 10/13/22 1512 10/13/22 1542 10/13/22 1713 10/13/22 2034 10/13/22 2337 10/14/22 0252  WBC 10.8*  --   --   --   --  7.7  LATICACIDVEN  --  3.3* 2.3* 2.1* 1.8  --     Prolonged Qtc-monitor  Hypokalemia: Repleted T2DM on SSI,stable. Recent Labs  Lab 10/14/22 0600 10/14/22 1141 10/14/22 1627 10/14/22 2109 10/15/22 0622  GLUCAP 91 91 92 116* 99    Permanent A-fib on Eliquis Chronic anxiety/depression:stable  Goals of care currently DNR- currently getting hospice care-plan is disposition back to home hospice versus inpatient hospice.  Hospice services following closely.  Awaiting neurology recommendation and plan.  Continue on conservative management.   Pressure ulcer stage I buttock POA as below Pressure Injury 10/13/22 Buttocks Medial Stage 1 -  Intact skin with non-blanchable redness of a localized area usually over a bony prominence. reddness due to incontinence per caregiver (Active)  10/13/22   Location: Buttocks  Location Orientation: Medial  Staging: Stage 1 -  Intact skin with non-blanchable redness of a localized area usually over a bony prominence.  Wound Description (Comments): reddness due to incontinence per caregiver  Present on Admission: Yes  Dressing Type Foam - Lift dressing to assess site every shift 10/14/22 0029    DVT prophylaxis: apixaban (ELIQUIS) tablet 2.5 mg Start: 10/13/22 2200 Code Status:   Code  Status: DNR Family Communication: plan of care discussed with patient's caregivef at bedside. Patient status is:  on[atient  because of  seizure Level of care: Telemetry Medical   Dispo: The patient is from:ILF w/ hospice.             Anticipated disposition: ILF w/ hospice vs inpatient hospice.  Objective: Vitals last 24 hrs: Vitals:   10/14/22 1939 10/14/22 2309 10/15/22 0308 10/15/22 0808  BP: (!) 115/55 127/65 (!) 156/74 133/70  Pulse: 68 79 81 83  Resp: 16 18 16 19   Temp: 98.4 F (36.9 C) (!) 97.5 F (36.4 C) (!) 97.5 F (36.4 C) 97.7 F (36.5 C)  TempSrc:  Oral Oral Oral  SpO2: 100% 97% 100% 96%  Weight:      Height:      Weight change:   Physical Examination: General exam: Aaox2-3, weak,older appearing HEENT:Oral mucosa moist, Ear/Nose WNL grossly, dentition normal. Respiratory system: bilaterally clear BS, no use of accessory muscle Cardiovascular system: S1 & S2 +, regular rate,. Gastrointestinal system: Abdomen soft, NT,ND,BS+ Nervous System:Alert, awake, moving all her extremities absent on the left side with dense weakness  Extremities: LE ankle edema neg, lower extremities warm Skin: No rashes,no icterus. MSK: thin frail  Medications reviewed:  Scheduled Meds:  apixaban  2.5 mg Oral BID   insulin aspart  0-5 Units Subcutaneous QHS   insulin aspart  0-9 Units Subcutaneous TID WC   OXcarbazepine  150 mg Oral BID   Continuous Infusions:  levETIRAcetam 500 mg (10/15/22 1018)   piperacillin-tazobactam (ZOSYN)  IV 3.375 g (10/15/22 0505)   Diet Order             DIET DYS 3 Room service appropriate? Yes; Fluid consistency: Thin  Diet effective now                  Intake/Output Summary (Last 24 hours) at 10/15/2022 1027 Last data filed at 10/15/2022 0534 Gross per 24 hour  Intake 944.13 ml  Output 500 ml  Net 444.13 ml    Net IO Since Admission: 642.73 mL [10/15/22 1027]  Wt Readings from Last 3 Encounters:  10/13/22 45.7 kg  08/27/22 59.5 kg  08/23/22 49.7 kg     Unresulted Labs (From admission, onward)    None     Data Reviewed: I have personally reviewed following labs  and imaging studies CBC: Recent Labs  Lab 10/13/22 1512 10/14/22 0252  WBC 10.8* 7.7  HGB 14.8 12.2  HCT 43.1 35.9*  MCV 92.7 92.8  PLT 241 152    Basic Metabolic Panel: Recent Labs  Lab 10/13/22 1512 10/14/22 0252 10/14/22 1549  NA 136 138 135  K 3.5 2.4* 5.1  CL 94* 97* 100  CO2 26 31 24   GLUCOSE 153* 107* 101*  BUN 11 10 10   CREATININE 0.77 0.70 0.73  CALCIUM 8.8* 8.0* 8.2*  MG 1.6* 1.7  --   PHOS  --  3.8  --     Recent Labs  Lab 10/13/22 1542  AMMONIA 29    Coagulation Profile: Recent Labs  Lab 10/13/22 1512  INR 1.2   CBG: Recent Labs  Lab 10/14/22 0600 10/14/22 1141 10/14/22 1627 10/14/22 2109 10/15/22 0622  GLUCAP 91 91 92 116* 99    Recent Labs  Lab 10/13/22 1542 10/13/22 1713 10/13/22 2034 10/13/22 2337  LATICACIDVEN 3.3* 2.3* 2.1* 1.8     Recent Results (from the past 240 hour(s))  Blood culture (routine x 2)  Status: None (Preliminary result)   Collection Time: 10/13/22  3:59 PM   Specimen: BLOOD  Result Value Ref Range Status   Specimen Description BLOOD BLOOD LEFT WRIST  Final   Special Requests   Final    BOTTLES DRAWN AEROBIC AND ANAEROBIC Blood Culture results may not be optimal due to an inadequate volume of blood received in culture bottles   Culture   Final    NO GROWTH 2 DAYS Performed at [facility], [address] [place], [place] [address]    Report Status PENDING  Incomplete  Blood culture (routine x 2)     Status: None (Preliminary result)   Collection Time: 10/13/22  4:04 PM   Specimen: BLOOD  Result Value Ref Range Status   Specimen Description BLOOD RIGHT ANTECUBITAL  Final   Special Requests   Final    BOTTLES DRAWN AEROBIC AND ANAEROBIC Blood Culture adequate volume   Culture   Final    NO GROWTH 2 DAYS Performed at [facility], [address] [place], [place] [address]    Report Status PENDING  Incomplete  Urine Culture (for pregnant, neutropenic or urologic patients or  patients with an indwelling urinary catheter)     Status: None (Preliminary result)   Collection Time: 10/14/22  4:34 AM   Specimen: Urine, Clean Catch  Result Value Ref Range Status   Specimen Description URINE, CLEAN CATCH  Final   Special Requests NONE  Final   Culture   Final    CULTURE REINCUBATED FOR BETTER GROWTH Performed at [facility], [address], [place], [place] [address]    Report Status PENDING  Incomplete    Antimicrobials: Anti-infectives (From admission, onward)    Start     Dose/Rate Route Frequency Ordered Stop   10/14/22 0100  piperacillin-tazobactam (ZOSYN) IVPB 3.375 g        3.375 g 12.5 mL/hr over 240 Minutes Intravenous Every 8 hours 10/13/22 2045        Culture/Microbiology    Component Value Date/Time   SDES URINE, CLEAN CATCH 10/14/2022 0434   SPECREQUEST NONE 10/14/2022 0434   CULT  10/14/2022 0434    CULTURE REINCUBATED FOR BETTER GROWTH Performed at [facility], [address], [place], [place] [address]    REPTSTATUS PENDING 10/14/2022 2956    Radiology Studies: Overnight EEG with video  Result Date: 10/14/2022 Charlsie Quest, MD     10/14/2022  4:38 AM Patient Name: Kara [name] MRN: [id number] Epilepsy Attending: Charlsie Quest Referring Physician/Provider: Milon Dikes, MD Duration: 10/14/2022 0013 to 10/14/2022 0430  Patient history: 89 yo F with right MCA and PCA territory stroke presented with rhythmic twitching of the RIGHT side. EEG to evaluate for seizure.  Level of alertness: Awake, asleep  AEDs during EEG study: LEV  Technical aspects: This EEG study was done with scalp electrodes positioned according to the 10-20 International system of electrode placement. Electrical activity was reviewed with band pass filter of 1-70Hz , sensitivity of 7 uV/mm, display speed of 40mm/sec with a 60Hz  notched filter applied as appropriate. EEG data were recorded continuously and digitally stored.  Video monitoring was available and  reviewed as appropriate.  Description: The posterior dominant rhythm consists of 8-9 Hz activity of moderate voltage (25-35 uV) seen predominantly in posterior head regions, asymmetric( right<left) and reactive to eye opening and eye closing. Sleep was characterized by vertex waves, sleep spindles (12 to 14 Hz), maximal frontocentral region, asymmetric (right<left),  POSTs. EEG showed continuous low amplitude 2-3Hz  delta slowing in right hemisphere. Intermittent 3-6hz  theta-delta slowing was also noted. Photic driving was not seen during photic stimulation. Hyperventilation was not performed.    ABNORMALITY - Continuous slow, right hemisphere - Intermittent slow, generalized  IMPRESSION: This study is suggestive of cortical dysfunction arising from right hemisphere, maximal right parietal region likely due to underlying stroke. Additionally there is mild diffuse encephalopathy. No seizures  were seen throughout the recording.  Charlsie Quest   DG Chest Portable 1 View  Result Date: 10/13/2022 CLINICAL DATA:  Cough, seizure EXAM: PORTABLE CHEST 1 VIEW COMPARISON:  05/18/2022 FINDINGS: Cardiomegaly with left chest multi lead pacer. Small layering bilateral pleural effusions. Probable emphysema. No acute airspace opacity. Chronic fracture deformity of the proximal left humerus. IMPRESSION: 1. Cardiomegaly with small layering bilateral pleural effusions. No acute airspace opacity. 2. Probable emphysema. 3. Chronic fracture deformity of the proximal left humerus. Electronically Signed   By: Jearld Lesch M.D.   On: 10/13/2022 15:59   CT Head Wo Contrast  Result Date: 10/13/2022 CLINICAL DATA:  Possible seizure. EXAM: CT HEAD WITHOUT CONTRAST TECHNIQUE: Contiguous axial images were obtained from the base of the skull through the vertex without intravenous contrast. RADIATION DOSE REDUCTION: This exam was performed according to the departmental dose-optimization program which includes automated exposure control,  adjustment of the mA and/or kV according to patient size and/or use of iterative reconstruction technique. COMPARISON:  CT head dated August 23, 2022. FINDINGS: Brain: No evidence of acute infarction, hemorrhage, hydrocephalus, extra-axial collection or mass lesion/mass effect. Large chronic right sided infarct involving the MCA and PCA territories again noted. Old right cerebellar infarct again noted. Stable atrophy and chronic microvascular ischemic changes. Vascular: Calcified atherosclerosis at the skull base. No hyperdense vessel. Skull: Normal. Negative for fracture or focal lesion. Sinuses/Orbits: No acute finding. Other: None. IMPRESSION: 1. No acute intracranial abnormality. Unchanged old right cerebral and cerebellar infarcts. Electronically Signed   By: Obie Dredge M.D.   On: 10/13/2022 15:54     LOS: 2 days   Lanae Boast, MD [facility]  10/15/2022, 10:27 AM

## 2022-10-15 NOTE — Progress Notes (Signed)
Nuerology NP at bedside. Still with bilateral facial twitching and left arm twitching. 2mg  of ativan ordered and given with immediate stop of twitching. Extra 1g Keppra also ordered to be given.

## 2022-10-15 NOTE — Progress Notes (Signed)
[facility] 1O10 [facility]) Hospitalized Hospice Patient  Kara [name] is a current [facility] hospice patient with a terminal diagnosis of other signs and symptoms involving cognitive functions following cerebrovascular disease.  Patient has secondary diagnoses of vascular dementia. Patient was transported to the hospital 5.27 after home caregiver observed patient to be "shaking" and believed she might be having a seizure.  Patient was originally taken to [facility] via EMS but was later brought to [facility] and admitted to the neuro unit for continuous EEG.  Patient's code status with ACC is DNR.  Per Dr. Patric Dykes with [facility], this is a hospice related admission.  Rounded in room with patient, private home caregiver is at bedside.  Caregiver states that patient had seizure this morning as witnessed by bedside RN and MD.  Additional medication given this morning, patient is currently asleep and does not wake up during my visit. Per caregiver, patient woke up last night and ate entire meal and was more awake.  Was able to update patient's daughter Kara [name] via phone regarding testing and mom's current status.   Vital Signs- 97.7/83/19    133/70   96% Room Air I&O- 944/500 Abnormal Labs- HCT 35.9, Calcium 8.2, Glucose 101 Diagnostics- none new   IV/PRN Meds- Tylenol 650mg  x 1, Potassium chloride in x 4, piperacillin-tazobactam IVPB 3.375g x 3, levetiracetam IVPB 500mg /185mL x 2 Problem List per Hospitalist: Assessment and Plan: Principal Problem:   Seizure (HCC)   Seizure-like activity Encephalopathy acute metabolic: Appreciate neurology input on board LTM EEG was discontinued however noticed to have some left facial twitching and was dissipating, discussed with neurology resuming Trileptal continue current Keppra 500 twice daily, continue seizure precaution.  Appears much more alert awake communicative interactive hold off on sedation/Ativan unless absolutely needed for seizure .Neuro  notified this morning and on the way to see the patient.   Sepsis with presumed RUE:AVWU tachycardia lactic acidosis placed on empiric Zosyn-urine culture ending reintubated.  Last Labs          Recent Labs  Lab 10/13/22 1512 10/13/22 1542 10/13/22 1713 10/13/22 2034 10/13/22 2337 10/14/22 0252  WBC 10.8*  --   --   --   --  7.7  LATICACIDVEN  --  3.3* 2.3* 2.1* 1.8  --       Prolonged Qtc-monitor   Hypokalemia: Repleted T2DM on SSI,stable. Last Labs         Recent Labs  Lab 10/14/22 0600 10/14/22 1141 10/14/22 1627 10/14/22 2109 10/15/22 0622  GLUCAP 91 91 92 116* 99      Permanent A-fib on Eliquis Chronic anxiety/depression:stable   Goals of care currently DNR- currently getting hospice care-plan is disposition back to home hospice versus inpatient hospice.  Hospice services following closely.  Awaiting neurology recommendation and plan.  Continue on conservative management.  Discharge Planning- Ongoing, plan for home with hospice once stabilized.  Family Contact- Private caregiver at bedside, daughter updated via phone.  IDT: updated ACC team Goals of Care: DNR, ongoing testing  Thank you for the opportunity to participate in this patient's care, please don't hesitate to call for any hospice related questions or concerns.   [name], RN, BSN [facility] Liaison [phone]

## 2022-10-15 NOTE — Progress Notes (Signed)
Initial Nutrition Assessment  DOCUMENTATION CODES:  Non-severe (moderate) malnutrition in context of chronic illness  INTERVENTION:  Liberalize to regular for more dining choices MVI with minerals daily  NUTRITION DIAGNOSIS:   Moderate Malnutrition related to chronic illness as evidenced by mild muscle depletion, mild fat depletion.  GOAL:   Patient will meet greater than or equal to 90% of their needs  MONITOR:   PO intake, I & O's, Weight trends, Skin  REASON FOR ASSESSMENT:    (Low BMI, malnutrition, pressure injury)    ASSESSMENT:  Pt with hx of HTN, atrial fibrillation, dementia, GERD, HLD, IBS, DM type 2, hx uterine cancer, and CAD presented to ED from her nursing facility for seizure like activity. Pt under hospice care at her facility.  Pt sleeping at the time of assessment. Caregiver at bedside able to provide a hx. Reports that pt has some days where she eats better than others but overall appetite is stable. Sometimes drinks ensure, but not consistently and doesn't like them. Will liberalize diet to allow for allow choices due to patient's age. Caregiver has been helping with meal selection.   Nutritionally Relevant Medications: Scheduled Meds:  insulin aspart  0-5 Units Subcutaneous QHS   insulin aspart  0-9 Units Subcutaneous TID WC   Continuous Infusions:  piperacillin-tazobactam (ZOSYN)  IV 3.375 g (10/15/22 0505)   PRN Meds: polyethylene glycol, prochlorperazine  Labs Reviewed: CBG ranges from 91-116 mg/dL over the last 24 hours  NUTRITION - FOCUSED PHYSICAL EXAM: Flowsheet Row Most Recent Value  Orbital Region Mild depletion  Upper Arm Region Mild depletion  Thoracic and Lumbar Region Mild depletion  Buccal Region Mild depletion  Temple Region Mild depletion  Clavicle Bone Region No depletion  Clavicle and Acromion Bone Region Mild depletion  Scapular Bone Region Mild depletion  Dorsal Hand No depletion  [edema]  Patellar Region Mild depletion   Anterior Thigh Region Mild depletion  Posterior Calf Region Mild depletion  Edema (RD Assessment) Mild  [BUE]  Hair Reviewed  [thin]  Eyes Reviewed  Mouth Reviewed  Skin Reviewed  Nails Reviewed       Diet Order:   Diet Order             DIET DYS 3 Room service appropriate? Yes; Fluid consistency: Thin  Diet effective now                   EDUCATION NEEDS:  Not appropriate for education at this time  Skin:  Skin Assessment: Reviewed RN Assessment Stage 1: medial buttocks, (3 cm x 4.5 cm)   Last BM:  5/29 - type 6  Height:   Ht Readings from Last 1 Encounters:  10/13/22 5\' 2"  (1.575 m)    Weight:   Wt Readings from Last 1 Encounters:  10/13/22 45.7 kg    Ideal Body Weight:  50 kg  BMI:  Body mass index is 18.43 kg/m.  Estimated Nutritional Needs:  Kcal:  1300-1500 kcal/d Protein:  60-80 g/d Fluid:  >/=1.5L/d    Greig Castilla, RD, LDN Clinical Dietitian RD pager # available in [facility]  After hours/weekend pager # available in [facility]

## 2022-10-15 NOTE — Progress Notes (Signed)
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  Mental status remained depressed after the seizure. Transferred to [facility] for LTM to rule out status epilepticus. Question possibly not getting benzo doses as prescribed versus UTI lowering seizure threshold.  S:// Patient is awake and alert in the bed being cleaned by staff. Family is at the bedside.  She was noted to have bilateral eye twitching and left facial twitching along with left arm. Retained mental status and awareness.   O:// Current vital signs: BP 133/70 (BP Location: Right Arm)   Pulse 83   Temp 97.7 F (36.5 C) (Oral)   Resp 19   Ht 5\' 2"  (1.575 m)   Wt 45.7 kg   SpO2 96%   BMI 18.43 kg/m  Vital signs in last 24 hours: Temp:  [97.5 F (36.4 C)-98.4 F (36.9 C)] 97.7 F (36.5 C) (05/29 0808) Pulse Rate:  [68-83] 83 (05/29 0808) Resp:  [16-19] 19 (05/29 0808) BP: (115-156)/(55-74) 133/70 (05/29 0808) SpO2:  [96 %-100 %] 96 % (05/29 0808)  GENERAL: Awake, alert in NAD HEENT: - Normocephalic and atraumatic, dry mm LUNGS - Clear to auscultation bilaterally with no wheezes CV - S1S2 RRR, no m/r/g, equal pulses bilaterally. ABDOMEN - Soft, nontender, nondistended with normoactive BS Ext: warm, well perfused, intact peripheral pulses, no edema  NEURO:  Mental Status: AA&Ox3, states the year is "25"  Language: speech is clear.  Naming, repetition, fluency, and comprehension intact. Cranial Nerves: PERRL. EOMI, visual fields full, no facial asymmetry, facial sensation intact, hearing intact, tongue/uvula/soft palate midline, normal sternocleidomastoid and trapezius muscle strength. No evidence of tongue atrophy or fasciculations Motor: moves all 4 extremities  Tone:  is normal and bulk is normal Sensation- Intact to light touch bilaterally Coordination: FTN intact bilaterally, no ataxia in BLE. Gait- deferred   Medications  Current Facility-Administered Medications:    acetaminophen (TYLENOL) tablet 650 mg, 650 mg, Oral, Q6H PRN, Darlin Drop, DO, 650 mg at 10/14/22 1638   apixaban (ELIQUIS) tablet 2.5 mg, 2.5 mg, Oral, BID, Hall, Carole N, DO, 2.5 mg at 10/15/22 1020   insulin aspart (novoLOG) injection 0-5 Units, 0-5 Units, Subcutaneous, QHS, Hall, Carole N, DO   insulin aspart (novoLOG) injection 0-9 Units, 0-9 Units, Subcutaneous, TID WC, Hall, Carole N, DO   levETIRAcetam (KEPPRA) IVPB 500 mg/100 mL premix, 500 mg, Intravenous, Q12H, Milon Dikes, MD, Last Rate: 400 mL/hr at 10/15/22 1018, 500 mg at 10/15/22 1018   LORazepam (ATIVAN) injection 2 mg, 2 mg, Intravenous, Q6H PRN, Hall, Carole N, DO   melatonin tablet 3 mg, 3 mg, Oral, QHS PRN, Hall, Carole N, DO   OXcarbazepine (TRILEPTAL) tablet 150 mg, 150 mg, Oral, BID, Kc, Ramesh, MD, 150 mg at 10/15/22 1055   piperacillin-tazobactam (ZOSYN) IVPB 3.375 g, 3.375 g, Intravenous, Q8H, Hall, Carole N, DO, Last Rate: 12.5 mL/hr at 10/15/22 0505, 3.375 g at 10/15/22 0505   polyethylene glycol (MIRALAX / GLYCOLAX) packet 17 g, 17 g, Oral, Daily PRN, Margo Aye, Carole N, DO   polyvinyl alcohol (LIQUIFILM TEARS) 1.4 % ophthalmic solution 1 drop, 1 drop, Both Eyes, PRN, Kc, Ramesh, MD   prochlorperazine (COMPAZINE) injection 5 mg, 5 mg, Intravenous, Q6H PRN, Darlin Drop, DO  Labs CBC    Component Value Date/Time  WBC 7.7 10/14/2022 0252   RBC 3.87 10/14/2022 0252   HGB 12.2 10/14/2022 0252   HGB 13.7 05/22/2014 1404   HCT 35.9 (L) 10/14/2022 0252   HCT 41.1 05/22/2014 1404   PLT 152 10/14/2022 0252   PLT 193 05/22/2014 1404   MCV 92.8 10/14/2022 0252   MCV 92 05/22/2014 1404   MCH 31.5 10/14/2022 0252   MCHC 34.0 10/14/2022 0252   RDW 12.7 10/14/2022 0252   RDW 13.7 05/22/2014 1404    LYMPHSABS 2.5 08/27/2022 0442   LYMPHSABS 2.6 05/22/2014 1404   MONOABS 0.4 08/27/2022 0442   MONOABS 0.4 05/22/2014 1404   EOSABS 0.2 08/27/2022 0442   EOSABS 0.1 05/22/2014 1404   BASOSABS 0.0 08/27/2022 0442   BASOSABS 0.0 05/22/2014 1404    CMP     Component Value Date/Time   NA 135 10/14/2022 1549   NA 142 05/22/2014 1404   K 5.1 10/14/2022 1549   K 3.2 (L) 05/22/2014 1404   CL 100 10/14/2022 1549   CL 105 05/22/2014 1404   CO2 24 10/14/2022 1549   CO2 28 05/22/2014 1404   GLUCOSE 101 (H) 10/14/2022 1549   GLUCOSE 135 (H) 05/22/2014 1404   BUN 10 10/14/2022 1549   BUN 18 05/22/2014 1404   CREATININE 0.73 10/14/2022 1549   CREATININE 0.82 09/15/2014 1536   CALCIUM 8.2 (L) 10/14/2022 1549   CALCIUM 8.7 05/22/2014 1404   PROT 5.2 (L) 08/27/2022 0442   PROT 5.7 (L) 02/05/2012 0700   ALBUMIN 2.2 (L) 08/27/2022 0442   ALBUMIN 2.5 (L) 02/05/2012 0700   AST 18 08/27/2022 0442   AST 17 02/05/2012 0700   ALT 9 08/27/2022 0442   ALT 19 02/05/2012 0700   ALKPHOS 46 08/27/2022 0442   ALKPHOS 76 02/05/2012 0700   BILITOT 1.0 08/27/2022 0442   BILITOT 0.7 02/05/2012 0700   GFRNONAA >60 10/14/2022 1549   GFRNONAA 56 (L) 05/22/2014 1404   GFRNONAA >60 02/12/2012 0735   GFRAA >60 06/13/2019 0525   GFRAA >60 05/22/2014 1404   GFRAA >60 02/12/2012 0735     Lipid Panel     Component Value Date/Time   CHOL 122 05/02/2022 0619   TRIG 114 05/02/2022 0619   HDL 41 05/02/2022 0619   CHOLHDL 3.0 05/02/2022 0619   VLDL 23 05/02/2022 0619   LDLCALC 58 05/02/2022 0619   LDLDIRECT 74.0 11/18/2021 1220     Imaging I have reviewed images in epic and the results pertinent to this consultation are:  CT-scan of the -No acute intracranial abnormality   Assessment: 89 year old female with prior right MCA/right PCA strokes with residual left hemiparesis, poststroke epilepsy, CAD, atrial fibrillation on Eliquis, frequent UTIs, currently on hospice care who was brought in for concern  for seizure at her facility. Mental status remained depressed after the seizure. - On exam today she is AA&Ox3, states the year is "25". Speech is clear. Naming, repetition, fluency, and comprehension intact - LTM EEG report from yesterday (5/28):  Continuous slow, right hemisphere; Intermittent slow, generalized. This study is suggestive of cortical dysfunction arising from right hemisphere, maximal right parietal region likely due to underlying stroke. Additionally there is mild diffuse encephalopathy. No seizures  were seen throughout the recording. - LTM EEG was discontinued yesterday (5/28).   Recommendations: - Continue Keppra - Continue Trileptal  - Continue to treat infection per primary team - Can resume her home Klonopin  - Will need outpatient Neurology follow up. - Neurohospitalist service will sign off.  Please call if there are additional questions.   Gevena Mart DNP, ACNPC-AG  [facility]  Electronically signed: Dr. Caryl Pina

## 2022-10-16 DIAGNOSIS — R569 Unspecified convulsions: Secondary | ICD-10-CM | POA: Diagnosis not present

## 2022-10-16 LAB — URINE CULTURE: Culture: >=100000 — AB

## 2022-10-16 LAB — MISC LABCORP TEST (SEND OUT): Labcorp test code: 716928

## 2022-10-16 LAB — GLUCOSE, CAPILLARY
Glucose-Capillary: 107 mg/dL — ABNORMAL HIGH (ref 70–99)
Glucose-Capillary: 94 mg/dL (ref 70–99)

## 2022-10-16 MED ORDER — LEVETIRACETAM 500 MG PO TABS: 500.0000 mg | ORAL_TABLET | Freq: Two times a day (BID) | ORAL | 0 refills | Status: AC

## 2022-10-16 MED ORDER — AMOXICILLIN 500 MG PO CAPS: 500.0000 mg | ORAL_CAPSULE | Freq: Two times a day (BID) | ORAL | 0 refills | Status: AC

## 2022-10-16 MED ORDER — AMOXICILLIN 500 MG PO CAPS: 500.0000 mg | ORAL_CAPSULE | Freq: Two times a day (BID) | ORAL | Status: DC

## 2022-10-16 MED ORDER — SACCHAROMYCES BOULARDII 250 MG PO CAPS: 250.0000 mg | ORAL_CAPSULE | Freq: Two times a day (BID) | ORAL | 0 refills | Status: AC

## 2022-10-16 NOTE — Progress Notes (Signed)
IV removed and telemetry discontinued. [facility] arrived for transport home.  Melony Overly, RN

## 2022-10-16 NOTE — Discharge Summary (Signed)
Physician Discharge Summary  Kara [name]:[id number] DOB: 1922/07/02 DOA: 10/13/2022  PCP: Lauro Regulus, MD  Admit date: 10/13/2022 Discharge date: 10/16/2022 Recommendations for Outpatient Follow-up:  Follow up with hospice agency   Discharge Dispo: return to Hospice Discharge Condition: Stable Code Status:   Code Status: DNR Diet recommendation:  Diet Order             Diet regular Room service appropriate? Yes with Assist; Fluid consistency: Thin  Diet effective now                    Brief/Interim Summary: 89 y.o.f w/ complex medical history including baseline dementia, permanent atrial fibrillation on Eliquis, sick sinus syndrome status post pacemaker placement, type 2 diabetes, hyperlipidemia, hypertension, coronary artery disease, chronic anxiety/depression, ischemic stroke in December 2023 with residual left sided weakness, recurrent urinary tract infections, who initially presented to [facility] ED from independent living facility for hospice care, due to shaking, seizure-like activities and seizure was suspected etiology given and EMS activated and brought to the ED and subsequently transferred from [facility] to [facility] for further neurology evaluation and LTM EEG UA was positive for pyuria, with tachycardia of 135 and lactic acidosis 3.3 code sepsis was called to the ED.  Peripheral blood cultures x 2 were obtained and the patient was started on Zosyn empirically. At the time of  admission patient was sedated and does not appear in distress. ED Course: Temperature 98.3.  BP 123/49, pulse 74, respiratory 16, O2 saturation 100% on 2 L.  Lab studies markable for lactic acid 3.3, repeat 2.3.  Serum glucose 153, magnesium 1.6, anion gap 16, serum bicarb 26.  WBC 10.8. Patient underwent LTM EEG and seen by neurology- Continuous slow, right hemisphere; Intermittent slow, generalized. This study is suggestive of cortical dysfunction arising from right hemisphere, maximal right  parietal region likely due to underlying stroke. Additionally there is mild diffuse encephalopathy. No seizures  were seen throughout the recording. - LTM EEG was discontinued 5/28.  Neurology recommending Keppra, Trileptal to treat underlying infection and resume home Klonopin and neurology signed off   Discharge Diagnoses:  Principal Problem:   Seizure (HCC) Active Problems:   Malnutrition of moderate degree (HCC)   Seizure-like activity Encephalopathy acute metabolic: Patient underwent LTM EEG and seen by neurology- Continuous slow, right hemisphere; Intermittent slow, generalized. This study is suggestive of cortical dysfunction arising from right hemisphere, maximal right parietal region likely due to underlying stroke. Additionally there is mild diffuse encephalopathy. No seizures  were seen throughout the recording. - LTM EEG was discontinued 5/28. noticed to have some left facial twitching and was dissipating, 5/29-resolved with Ativan x 1, neurology recommending Keppra, Trileptal to treat underlying infection and resume home Klonopin and neurology signed off    Sepsis due to Enterococcus faecalis WJX:BJYN tachycardia lactic acidosis placed on empiric Zosyn-changing to oral antibiotics on discharge  Recent Labs  Lab 10/13/22 1512 10/13/22 1542 10/13/22 1713 10/13/22 2034 10/13/22 2337 10/14/22 0252  WBC 10.8*  --   --   --   --  7.7  LATICACIDVEN  --  3.3* 2.3* 2.1* 1.8  --     Prolonged Qtc-monitor Hypokalemia: Repleted T2DM on SSI,stable.  Permanent A-fib on Eliquis Chronic anxiety/depression:stable Goals of care currenly DNR- currently getting hospice care-plan is disposition back to home hospice   Pressure ulcer stage I buttock POA as below Pressure Injury 10/13/22 Buttocks Medial Stage 1 -  Intact skin with non-blanchable redness of a  localized area usually over a bony prominence. reddness due to incontinence per caregiver (Active)  10/13/22   Location: Buttocks   Location Orientation: Medial  Staging: Stage 1 -  Intact skin with non-blanchable redness of a localized area usually over a bony prominence.  Wound Description (Comments): reddness due to incontinence per caregiver  Present on Admission: Yes  Dressing Type Foam - Lift dressing to assess site every shift 10/14/22 0029    Consults: Neurology Subjective: Alert awake oriented to self communicative interactive no complaints.  Discharge Exam: Vitals:   10/16/22 0300 10/16/22 0747  BP: 136/74 136/77  Pulse: 90 98  Resp: 18 20  Temp: 98 F (36.7 C) (!) 97.4 F (36.3 C)  SpO2: 93% 94%   General: Pt is alert, awake, not in acute distress Cardiovascular: RRR, S1/S2 +, no rubs, no gallops Respiratory: CTA bilaterally, no wheezing, no rhonchi Abdominal: Soft, NT, ND, bowel sounds + Extremities: no edema, no cyanosis  Discharge Instructions  Discharge Instructions     Discharge instructions   Complete by: As directed    Please call call MD or return to ER for similar or worsening recurring problem that brought you to hospital or if any fever,nausea/vomiting,abdominal pain, uncontrolled pain, chest pain,  shortness of breath or any other alarming symptoms.  Please follow-up your doctor as instructed in a week time and call the office for appointment.  Please avoid alcohol, smoking, or any other illicit substance and maintain healthy habits including taking your regular medications as prescribed.  You were cared for by a hospitalist during your hospital stay. If you have any questions about your discharge medications or the care you received while you were in the hospital after you are discharged, you can call the unit and ask to speak with the hospitalist on call if the hospitalist that took care of you is not available.  Once you are discharged, your primary care physician will handle any further medical issues. Please note that NO REFILLS for any discharge medications will be  authorized once you are discharged, as it is imperative that you return to your primary care physician (or establish a relationship with a primary care physician if you do not have one) for your aftercare needs so that they can reassess your need for medications and monitor your lab values   Discharge wound care:   Complete by: As directed    Wound care to partial thickness Stage 2 pressure injury to left sacrum (POA): cleanse with NS, apt dry. Cover with size appropriate piece of xeroform gauze, secure with silicone foam dressing for sacrum places with "tip" oriented pointing away from the anus. Change xeroform daily. May change Foam every 3 days or PRN soiling.   Increase activity slowly   Complete by: As directed       Allergies as of 10/16/2022       Reactions   Baycol [cerivastatin Sodium]    Cardizem [diltiazem Hcl]    LOWER EXTREMITY EDEMA   Crestor [rosuvastatin Calcium] Other (See Comments)   INTOLERANT   Cymbalta [duloxetine Hcl] Nausea Only   Dronedarone Other (See Comments)   Fatigue   Metoprolol Tartrate Other (See Comments)   Omeprazole Other (See Comments)   Pravachol    INTOLERANT   Statins Other (See Comments)   Vioxx [rofecoxib]    Zocor [simvastatin]    INTOLERANT        Medication List     TAKE these medications    acetaminophen 325 MG tablet  Commonly known as: TYLENOL Take 2 tablets (650 mg total) by mouth every 4 (four) hours as needed for mild pain (or temp > 37.5 C (99.5 F)). What changed:  how much to take when to take this   amoxicillin 500 MG capsule Commonly known as: AMOXIL Take 1 capsule (500 mg total) by mouth every 12 (twelve) hours for 7 days.   apixaban 2.5 MG Tabs tablet Commonly known as: ELIQUIS Take 1 tablet (2.5 mg total) by mouth 2 (two) times daily.   BEANO PO Take 1 tablet by mouth daily.   citalopram 20 MG tablet Commonly known as: CELEXA Take 20 mg by mouth daily.   clonazePAM 0.5 MG disintegrating tablet Commonly  known as: KLONOPIN Take 1 tablet (0.5 mg total) by mouth as needed for seizure (seizure lasting over 2 minutes).   levETIRAcetam 500 MG tablet Commonly known as: Keppra Take 1 tablet (500 mg total) by mouth 2 (two) times daily.   loperamide 2 MG capsule Commonly known as: IMODIUM Take 1 capsule (2 mg total) by mouth as needed for diarrhea or loose stools.   LORazepam 0.5 MG tablet Commonly known as: ATIVAN Take 0.25 mg by mouth daily as needed for anxiety.   melatonin 3 MG Tabs tablet Take 1 tablet (3 mg total) by mouth at bedtime.   metoprolol tartrate 25 MG tablet Commonly known as: LOPRESSOR Take 0.5 tablets (12.5 mg total) by mouth 2 (two) times daily.   OXcarbazepine 150 MG tablet Commonly known as: TRILEPTAL Take 1 tablet (150 mg total) by mouth 2 (two) times daily.   potassium chloride 20 MEQ packet Commonly known as: KLOR-CON Take 20 mEq by mouth daily. What changed:  when to take this additional instructions   saccharomyces boulardii 250 MG capsule Commonly known as: Florastor Take 1 capsule (250 mg total) by mouth 2 (two) times daily for 14 days.               Discharge Care Instructions  (From admission, onward)           Start     Ordered   10/16/22 0000  Discharge wound care:       Comments: Wound care to partial thickness Stage 2 pressure injury to left sacrum (POA): cleanse with NS, apt dry. Cover with size appropriate piece of xeroform gauze, secure with silicone foam dressing for sacrum places with "tip" oriented pointing away from the anus. Change xeroform daily. May change Foam every 3 days or PRN soiling.   10/16/22 0854            Follow-up Information     [facility] Follow up in 1 day(s).   Specialty: Hospice and Palliative Medicine Contact information: [address] [place] [address] [phone]               Allergies  Allergen Reactions   Baycol [Cerivastatin Sodium]    Cardizem  [Diltiazem Hcl]     LOWER EXTREMITY EDEMA   Crestor [Rosuvastatin Calcium] Other (See Comments)    INTOLERANT   Cymbalta [Duloxetine Hcl] Nausea Only   Dronedarone Other (See Comments)    Fatigue   Metoprolol Tartrate Other (See Comments)   Omeprazole Other (See Comments)   Pravachol     INTOLERANT   Statins Other (See Comments)   Vioxx [Rofecoxib]    Zocor [Simvastatin]     INTOLERANT    The results of significant diagnostics from this hospitalization (including imaging, microbiology, ancillary and laboratory) are listed  below for reference.    Microbiology: Recent Results (from the past 240 hour(s))  Blood culture (routine x 2)     Status: None (Preliminary result)   Collection Time: 10/13/22  3:59 PM   Specimen: BLOOD  Result Value Ref Range Status   Specimen Description BLOOD BLOOD LEFT WRIST  Final   Special Requests   Final    BOTTLES DRAWN AEROBIC AND ANAEROBIC Blood Culture results may not be optimal due to an inadequate volume of blood received in culture bottles   Culture   Final    NO GROWTH 3 DAYS Performed at [facility], [address], [place], [place] [address]    Report Status PENDING  Incomplete  Blood culture (routine x 2)     Status: None (Preliminary result)   Collection Time: 10/13/22  4:04 PM   Specimen: BLOOD  Result Value Ref Range Status   Specimen Description BLOOD RIGHT ANTECUBITAL  Final   Special Requests   Final    BOTTLES DRAWN AEROBIC AND ANAEROBIC Blood Culture adequate volume   Culture   Final    NO GROWTH 3 DAYS Performed at [facility], [address], [place], [place] [address]    Report Status PENDING  Incomplete  Urine Culture (for pregnant, neutropenic or urologic patients or patients with an indwelling urinary catheter)     Status: Abnormal   Collection Time: 10/14/22  4:34 AM   Specimen: Urine, Clean Catch  Result Value Ref Range Status   Specimen Description URINE, CLEAN CATCH  Final   Special Requests    Final    NONE Performed at [facility], [address] [place], [place] [address]    Culture >=100,000 COLONIES/mL ENTEROCOCCUS FAECALIS (A)  Final   Report Status 10/16/2022 FINAL  Final   Organism ID, Bacteria ENTEROCOCCUS FAECALIS (A)  Final      Susceptibility   Enterococcus faecalis - MIC*    AMPICILLIN <=2 SENSITIVE Sensitive     NITROFURANTOIN <=16 SENSITIVE Sensitive     VANCOMYCIN 1 SENSITIVE Sensitive     * >=100,000 COLONIES/mL ENTEROCOCCUS FAECALIS    Procedures/Studies: Overnight EEG with video  Result Date: 10/14/2022 Charlsie Quest, MD     10/15/2022  1:53 PM Patient Name: Kara [name] MRN: [id number] Epilepsy Attending: Charlsie Quest Referring Physician/Provider: Milon Dikes, MD Duration: 10/14/2022 0013 to 10/14/2022 1504  Patient history: 89 yo F with right MCA and PCA territory stroke presented with rhythmic twitching of the RIGHT side. EEG to evaluate for seizure.  Level of alertness: Awake, asleep  AEDs during EEG study: LEV  Technical aspects: This EEG study was done with scalp electrodes positioned according to the 10-20 International system of electrode placement. Electrical activity was reviewed with band pass filter of 1-70Hz , sensitivity of 7 uV/mm, display speed of 68mm/sec with a 60Hz  notched filter applied as appropriate. EEG data were recorded continuously and digitally stored.  Video monitoring was available and reviewed as appropriate.  Description: The posterior dominant rhythm consists of 8-9 Hz activity of moderate voltage (25-35 uV) seen predominantly in posterior head regions, asymmetric( right<left) and reactive to eye opening and eye closing. Sleep was characterized by vertex waves, sleep spindles (12 to 14 Hz), maximal frontocentral region, asymmetric (right<left), POSTs. EEG showed continuous low amplitude 2-3Hz  delta slowing in right hemisphere. Intermittent 3-6hz  theta-delta slowing was also noted. Photic driving was not seen during photic  stimulation. Hyperventilation was not performed.    ABNORMALITY - Continuous  slow, right hemisphere - Intermittent slow, generalized  IMPRESSION: This study is suggestive of cortical dysfunction arising from right hemisphere, maximal right parietal region likely due to underlying stroke. Additionally there is mild diffuse encephalopathy. No seizures  were seen throughout the recording.  Charlsie Quest   DG Chest Portable 1 View  Result Date: 10/13/2022 CLINICAL DATA:  Cough, seizure EXAM: PORTABLE CHEST 1 VIEW COMPARISON:  05/18/2022 FINDINGS: Cardiomegaly with left chest multi lead pacer. Small layering bilateral pleural effusions. Probable emphysema. No acute airspace opacity. Chronic fracture deformity of the proximal left humerus. IMPRESSION: 1. Cardiomegaly with small layering bilateral pleural effusions. No acute airspace opacity. 2. Probable emphysema. 3. Chronic fracture deformity of the proximal left humerus. Electronically Signed   By: Jearld Lesch M.D.   On: 10/13/2022 15:59   CT Head Wo Contrast  Result Date: 10/13/2022 CLINICAL DATA:  Possible seizure. EXAM: CT HEAD WITHOUT CONTRAST TECHNIQUE: Contiguous axial images were obtained from the base of the skull through the vertex without intravenous contrast. RADIATION DOSE REDUCTION: This exam was performed according to the departmental dose-optimization program which includes automated exposure control, adjustment of the mA and/or kV according to patient size and/or use of iterative reconstruction technique. COMPARISON:  CT head dated August 23, 2022. FINDINGS: Brain: No evidence of acute infarction, hemorrhage, hydrocephalus, extra-axial collection or mass lesion/mass effect. Large chronic right sided infarct involving the MCA and PCA territories again noted. Old right cerebellar infarct again noted. Stable atrophy and chronic microvascular ischemic changes. Vascular: Calcified atherosclerosis at the skull base. No hyperdense vessel. Skull:  Normal. Negative for fracture or focal lesion. Sinuses/Orbits: No acute finding. Other: None. IMPRESSION: 1. No acute intracranial abnormality. Unchanged old right cerebral and cerebellar infarcts. Electronically Signed   By: Obie Dredge M.D.   On: 10/13/2022 15:54    Labs: BNP (last 3 results) No results for input(s): "BNP" in the last 8760 hours. Basic Metabolic Panel: Recent Labs  Lab 10/13/22 1512 10/14/22 0252 10/14/22 1549  NA 136 138 135  K 3.5 2.4* 5.1  CL 94* 97* 100  CO2 26 31 24   GLUCOSE 153* 107* 101*  BUN 11 10 10   CREATININE 0.77 0.70 0.73  CALCIUM 8.8* 8.0* 8.2*  MG 1.6* 1.7  --   PHOS  --  3.8  --    Liver Function Tests: No results for input(s): "AST", "ALT", "ALKPHOS", "BILITOT", "PROT", "ALBUMIN" in the last 168 hours. No results for input(s): "LIPASE", "AMYLASE" in the last 168 hours. Recent Labs  Lab 10/13/22 1542  AMMONIA 29   CBC: Recent Labs  Lab 10/13/22 1512 10/14/22 0252  WBC 10.8* 7.7  HGB 14.8 12.2  HCT 43.1 35.9*  MCV 92.7 92.8  PLT 241 152   Cardiac Enzymes: No results for input(s): "CKTOTAL", "CKMB", "CKMBINDEX", "TROPONINI" in the last 168 hours. BNP: Invalid input(s): "POCBNP" CBG: Recent Labs  Lab 10/15/22 1213 10/15/22 1713 10/15/22 2108 10/16/22 0612 10/16/22 0814  GLUCAP 113* 90 77 107* 94   D-Dimer No results for input(s): "DDIMER" in the last 72 hours. Hgb A1c No results for input(s): "HGBA1C" in the last 72 hours. Lipid Profile No results for input(s): "CHOL", "HDL", "LDLCALC", "TRIG", "CHOLHDL", "LDLDIRECT" in the last 72 hours. Thyroid function studies No results for input(s): "TSH", "T4TOTAL", "T3FREE", "THYROIDAB" in the last 72 hours.  Invalid input(s): "FREET3" Anemia work up No results for input(s): "VITAMINB12", "FOLATE", "FERRITIN", "TIBC", "IRON", "RETICCTPCT" in the last 72 hours. Urinalysis    Component Value Date/Time  COLORURINE YELLOW (A) 10/13/2022 1629   APPEARANCEUR HAZY (A)  10/13/2022 1629   APPEARANCEUR Clear 05/27/2019 1002   LABSPEC 1.011 10/13/2022 1629   LABSPEC 1.016 12/26/2011 1844   PHURINE 6.0 10/13/2022 1629   GLUCOSEU NEGATIVE 10/13/2022 1629   GLUCOSEU NEGATIVE 04/02/2022 1023   HGBUR LARGE (A) 10/13/2022 1629   BILIRUBINUR NEGATIVE 10/13/2022 1629   BILIRUBINUR negative 05/21/2021 1337   BILIRUBINUR Negative 05/27/2019 1002   BILIRUBINUR Negative 12/26/2011 1844   KETONESUR NEGATIVE 10/13/2022 1629   PROTEINUR NEGATIVE 10/13/2022 1629   UROBILINOGEN 0.2 04/02/2022 1023   NITRITE NEGATIVE 10/13/2022 1629   LEUKOCYTESUR SMALL (A) 10/13/2022 1629   LEUKOCYTESUR 1+ 12/26/2011 1844   Sepsis Labs Recent Labs  Lab 10/13/22 1512 10/14/22 0252  WBC 10.8* 7.7   Microbiology Recent Results (from the past 240 hour(s))  Blood culture (routine x 2)     Status: None (Preliminary result)   Collection Time: 10/13/22  3:59 PM   Specimen: BLOOD  Result Value Ref Range Status   Specimen Description BLOOD BLOOD LEFT WRIST  Final   Special Requests   Final    BOTTLES DRAWN AEROBIC AND ANAEROBIC Blood Culture results may not be optimal due to an inadequate volume of blood received in culture bottles   Culture   Final    NO GROWTH 3 DAYS Performed at [facility], [address] [place], [place] [address]    Report Status PENDING  Incomplete  Blood culture (routine x 2)     Status: None (Preliminary result)   Collection Time: 10/13/22  4:04 PM   Specimen: BLOOD  Result Value Ref Range Status   Specimen Description BLOOD RIGHT ANTECUBITAL  Final   Special Requests   Final    BOTTLES DRAWN AEROBIC AND ANAEROBIC Blood Culture adequate volume   Culture   Final    NO GROWTH 3 DAYS Performed at [facility], [address], [place], [place] [address]    Report Status PENDING  Incomplete  Urine Culture (for pregnant, neutropenic or urologic patients or patients with an indwelling urinary catheter)     Status: Abnormal   Collection  Time: 10/14/22  4:34 AM   Specimen: Urine, Clean Catch  Result Value Ref Range Status   Specimen Description URINE, CLEAN CATCH  Final   Special Requests   Final    NONE Performed at [facility], [address] [place], [place] [address]    Culture >=100,000 COLONIES/mL ENTEROCOCCUS FAECALIS (A)  Final   Report Status 10/16/2022 FINAL  Final   Organism ID, Bacteria ENTEROCOCCUS FAECALIS (A)  Final      Susceptibility   Enterococcus faecalis - MIC*    AMPICILLIN <=2 SENSITIVE Sensitive     NITROFURANTOIN <=16 SENSITIVE Sensitive     VANCOMYCIN 1 SENSITIVE Sensitive     * >=100,000 COLONIES/mL ENTEROCOCCUS FAECALIS   Time coordinating discharge: 35  minutes  SIGNED: Lanae Boast, MD  [facility] 10/16/2022, 8:55 AM  If 7PM-7AM, please contact night-coverage [email or web address]

## 2022-10-16 NOTE — Consult Note (Addendum)
WOC Nurse Consult Note: Reason for Consult:Patient with partial thickness skin loss (Stage 2 PI) at left sacrum, also with irritant contact dermatitis secondary to urinary and fecal incontinence.  ICD-10 CM Codes for Irritant Dermatitis  L24A2 - Due to fecal, urinary or dual incontinence L24A9 - Due to friction or contact with other specified body fluids L30.4  - Erythema intertrigo. Also used for abrasion of the hand, chafing of the skin, dermatitis due to sweating and friction, friction dermatitis, friction eczema, and genital/thigh intertrigo.   Wound type: pressure plus moisture Pressure Injury POA: Yes Measurement: 0.4cm x 0.8cm x 0.1cm Wound ZOX:WRUE, moist Drainage (amount, consistency, odor) scant serous Periwound: Periwound erythema due to irritant contact dermatitis. Patient is incontinent of stool during my assessment, has an external female urinary incontinence device with suction that has to be changed due to fecal contamination. Labial erythema and edema secondary to ICD. Dressing procedure/placement/frequency: Turning and repositioning is in place, I have added guidance for minimizing time in the supine position. Heels are intact and will be elevated using Prevalon boots. Timely incontinence care is to be provided using our house skin care products, specifically, the cleanser (pH balanced, no rinse) and the clear zinc oxide moisture barrier ointment. Topical care to the wound will be with daily and PRN cleanse and dry followed by covering with an antimicrobial nonadherent (xeroform) covered/secured with a silicone foam dressing. This is to be placed with the "tip" oriented pointing away form the anus.  WOC nursing team will not follow, but will remain available to this patient, the nursing and medical teams.  Please re-consult if needed.  Thank you for inviting [place] to participate in this patient's Plan of Care.  Ladona Mow, MSN, RN, CNS, GNP, Leda Min, [employer], [employer] phone:  [phone]

## 2022-10-16 NOTE — Progress Notes (Signed)
[facility] 1O10 [facility]) [facility] Liaison Note  Notified from [facility] Fabio Neighbors that patient is set to discharge back to ILF at [facility] today. Rounded on patient and caregiver at bedside.  Patient alert and oriented, denies pain. Caregiver states they have all needed equipment for patient's care in the facility currently.  Will notify [facility] LTC team of patient's discharge and ACC will resume care at facility.  [name], RN, BSN [facility] Liaison [phone]

## 2022-10-16 NOTE — TOC Transition Note (Signed)
Transition of Care [facility]) - CM/SW Discharge Note   Patient Details  Name: Kara [name] MRN: [id number] Date of Birth: 04-23-1923  Transition of Care [facility]) CM/SW Contact:  Kermit Balo, RN Phone Number: 10/16/2022, 10:25 AM   Clinical Narrative:    Pt is discharging back to her ILF at [facility] with resumption of hospice services.  Caregiver at the bedside and asking for PTAR back home.  Address verified. CM will arrange transport. Caregiver will pick up medications from the pharmacy.   Final next level of care: Home w Hospice Care Barriers to Discharge: No Barriers Identified   Patient Goals and CMS Choice CMS Medicare.gov Compare Post Acute Care list provided to:: Patient Represenative (must comment) Choice offered to / list presented to : Patient (caregiver)  Discharge Placement                         Discharge Plan and Services Additional resources added to the After Visit Summary for     Discharge Planning Services: CM Consult (Pt not connected to home oxygen agency, currently on 3 L [place]) Post Acute Care Choice: Hospice                               Social Determinants of Health (SDOH) Interventions SDOH Screenings   Food Insecurity: No Food Insecurity (10/13/2022)  Housing: Low Risk  (10/13/2022)  Transportation Needs: No Transportation Needs (10/13/2022)  Utilities: Not At Risk (10/13/2022)  Depression (PHQ2-9): Low Risk  (04/22/2022)  Financial Resource Strain: Low Risk  (03/12/2022)  Physical Activity: Insufficiently Active (01/10/2022)  Social Connections: Moderately Isolated (03/12/2022)  Stress: No Stress Concern Present (03/12/2022)  Tobacco Use: Medium Risk (10/14/2022)     Readmission Risk Interventions    10/14/2022    1:40 PM  Readmission Risk Prevention Plan  Transportation Screening Complete  PCP or Specialist Appt within 3-5 Days Complete  HRI or Home Care Consult Complete  Social Work Consult for Recovery Care Planning/Counseling  Complete  Palliative Care Screening Complete  Medication Review Oceanographer) Complete

## 2022-10-16 NOTE — Care Management Important Message (Signed)
Important Message  Patient Details  Name: Kara [name] MRN: [id number] Date of Birth: 1923/01/04   Medicare Important Message Given:  Yes     Dorena Bodo 10/16/2022, 4:26 PM

## 2022-10-16 NOTE — Consult Note (Signed)
   [facility] Inpatient Consult   10/16/2022  Kara [name] [birth year] [id number]  [facility] [[facility]]  Accountable Care Organization [ACO] Patient: Medicare ACO REACH  Primary Care Provider:  Lauro Regulus, MD with [facility] Care: Active with Civil engineer, contracting prior to admission  Chart reviewed and reveals the patient is currently transitioning to back with Hospice Care at [facility] ILF is anticipated..   Plan: Patient will have full case management services through Hospice and needs will be met at the hospice level of care. No [facility] Care Management is planned for transitional needs. Will sign off at transition from hospital.  For questions,   Charlesetta Shanks, RN BSN CCM [facility]  [phone] business mobile phone Toll free office [phone]  *Concierge Line  [phone] Fax number: [phone] [email or web address]

## 2022-10-17 DIAGNOSIS — G40909 Epilepsy, unspecified, not intractable, without status epilepticus: Secondary | ICD-10-CM | POA: Diagnosis not present

## 2022-10-17 DIAGNOSIS — F01511 Vascular dementia, unspecified severity, with agitation: Secondary | ICD-10-CM | POA: Diagnosis not present

## 2022-10-17 DIAGNOSIS — F0153 Vascular dementia, unspecified severity, with mood disturbance: Secondary | ICD-10-CM | POA: Diagnosis not present

## 2022-10-17 DIAGNOSIS — I4891 Unspecified atrial fibrillation: Secondary | ICD-10-CM | POA: Diagnosis not present

## 2022-10-17 DIAGNOSIS — R634 Abnormal weight loss: Secondary | ICD-10-CM | POA: Diagnosis not present

## 2022-10-17 DIAGNOSIS — I69818 Other symptoms and signs involving cognitive functions following other cerebrovascular disease: Secondary | ICD-10-CM | POA: Diagnosis not present

## 2022-10-18 DIAGNOSIS — R634 Abnormal weight loss: Secondary | ICD-10-CM | POA: Diagnosis not present

## 2022-10-18 DIAGNOSIS — I4891 Unspecified atrial fibrillation: Secondary | ICD-10-CM | POA: Diagnosis not present

## 2022-10-18 DIAGNOSIS — I2510 Atherosclerotic heart disease of native coronary artery without angina pectoris: Secondary | ICD-10-CM | POA: Diagnosis not present

## 2022-10-18 DIAGNOSIS — G40909 Epilepsy, unspecified, not intractable, without status epilepticus: Secondary | ICD-10-CM | POA: Diagnosis not present

## 2022-10-18 DIAGNOSIS — I739 Peripheral vascular disease, unspecified: Secondary | ICD-10-CM | POA: Diagnosis not present

## 2022-10-18 DIAGNOSIS — I10 Essential (primary) hypertension: Secondary | ICD-10-CM | POA: Diagnosis not present

## 2022-10-18 DIAGNOSIS — F01511 Vascular dementia, unspecified severity, with agitation: Secondary | ICD-10-CM | POA: Diagnosis not present

## 2022-10-18 DIAGNOSIS — K219 Gastro-esophageal reflux disease without esophagitis: Secondary | ICD-10-CM | POA: Diagnosis not present

## 2022-10-18 DIAGNOSIS — M1990 Unspecified osteoarthritis, unspecified site: Secondary | ICD-10-CM | POA: Diagnosis not present

## 2022-10-18 DIAGNOSIS — I69818 Other symptoms and signs involving cognitive functions following other cerebrovascular disease: Secondary | ICD-10-CM | POA: Diagnosis not present

## 2022-10-18 DIAGNOSIS — F0153 Vascular dementia, unspecified severity, with mood disturbance: Secondary | ICD-10-CM | POA: Diagnosis not present

## 2022-10-18 DIAGNOSIS — Z87440 Personal history of urinary (tract) infections: Secondary | ICD-10-CM | POA: Diagnosis not present

## 2022-10-18 DIAGNOSIS — Z950 Presence of cardiac pacemaker: Secondary | ICD-10-CM | POA: Diagnosis not present

## 2022-10-18 DIAGNOSIS — I495 Sick sinus syndrome: Secondary | ICD-10-CM | POA: Diagnosis not present

## 2022-10-18 LAB — CULTURE, BLOOD (ROUTINE X 2): Culture: NO GROWTH

## 2022-10-21 DIAGNOSIS — F01511 Vascular dementia, unspecified severity, with agitation: Secondary | ICD-10-CM | POA: Diagnosis not present

## 2022-10-21 DIAGNOSIS — I69818 Other symptoms and signs involving cognitive functions following other cerebrovascular disease: Secondary | ICD-10-CM | POA: Diagnosis not present

## 2022-10-21 DIAGNOSIS — R634 Abnormal weight loss: Secondary | ICD-10-CM | POA: Diagnosis not present

## 2022-10-21 DIAGNOSIS — G40909 Epilepsy, unspecified, not intractable, without status epilepticus: Secondary | ICD-10-CM | POA: Diagnosis not present

## 2022-10-21 DIAGNOSIS — I4891 Unspecified atrial fibrillation: Secondary | ICD-10-CM | POA: Diagnosis not present

## 2022-10-21 DIAGNOSIS — F0153 Vascular dementia, unspecified severity, with mood disturbance: Secondary | ICD-10-CM | POA: Diagnosis not present

## 2022-10-22 DIAGNOSIS — G40909 Epilepsy, unspecified, not intractable, without status epilepticus: Secondary | ICD-10-CM | POA: Diagnosis not present

## 2022-10-22 DIAGNOSIS — R634 Abnormal weight loss: Secondary | ICD-10-CM | POA: Diagnosis not present

## 2022-10-22 DIAGNOSIS — I69818 Other symptoms and signs involving cognitive functions following other cerebrovascular disease: Secondary | ICD-10-CM | POA: Diagnosis not present

## 2022-10-22 DIAGNOSIS — I4891 Unspecified atrial fibrillation: Secondary | ICD-10-CM | POA: Diagnosis not present

## 2022-10-22 DIAGNOSIS — F01511 Vascular dementia, unspecified severity, with agitation: Secondary | ICD-10-CM | POA: Diagnosis not present

## 2022-10-22 DIAGNOSIS — F0153 Vascular dementia, unspecified severity, with mood disturbance: Secondary | ICD-10-CM | POA: Diagnosis not present

## 2022-10-23 DIAGNOSIS — F0153 Vascular dementia, unspecified severity, with mood disturbance: Secondary | ICD-10-CM | POA: Diagnosis not present

## 2022-10-23 DIAGNOSIS — G40909 Epilepsy, unspecified, not intractable, without status epilepticus: Secondary | ICD-10-CM | POA: Diagnosis not present

## 2022-10-23 DIAGNOSIS — I69818 Other symptoms and signs involving cognitive functions following other cerebrovascular disease: Secondary | ICD-10-CM | POA: Diagnosis not present

## 2022-10-23 DIAGNOSIS — F01511 Vascular dementia, unspecified severity, with agitation: Secondary | ICD-10-CM | POA: Diagnosis not present

## 2022-10-23 DIAGNOSIS — I4891 Unspecified atrial fibrillation: Secondary | ICD-10-CM | POA: Diagnosis not present

## 2022-10-23 DIAGNOSIS — R634 Abnormal weight loss: Secondary | ICD-10-CM | POA: Diagnosis not present

## 2022-10-28 DIAGNOSIS — I4891 Unspecified atrial fibrillation: Secondary | ICD-10-CM | POA: Diagnosis not present

## 2022-10-28 DIAGNOSIS — G40909 Epilepsy, unspecified, not intractable, without status epilepticus: Secondary | ICD-10-CM | POA: Diagnosis not present

## 2022-10-28 DIAGNOSIS — F01511 Vascular dementia, unspecified severity, with agitation: Secondary | ICD-10-CM | POA: Diagnosis not present

## 2022-10-28 DIAGNOSIS — F0153 Vascular dementia, unspecified severity, with mood disturbance: Secondary | ICD-10-CM | POA: Diagnosis not present

## 2022-10-28 DIAGNOSIS — I69818 Other symptoms and signs involving cognitive functions following other cerebrovascular disease: Secondary | ICD-10-CM | POA: Diagnosis not present

## 2022-10-28 DIAGNOSIS — R634 Abnormal weight loss: Secondary | ICD-10-CM | POA: Diagnosis not present

## 2022-10-29 DIAGNOSIS — R634 Abnormal weight loss: Secondary | ICD-10-CM | POA: Diagnosis not present

## 2022-10-29 DIAGNOSIS — I4891 Unspecified atrial fibrillation: Secondary | ICD-10-CM | POA: Diagnosis not present

## 2022-10-29 DIAGNOSIS — G40909 Epilepsy, unspecified, not intractable, without status epilepticus: Secondary | ICD-10-CM | POA: Diagnosis not present

## 2022-10-29 DIAGNOSIS — I69818 Other symptoms and signs involving cognitive functions following other cerebrovascular disease: Secondary | ICD-10-CM | POA: Diagnosis not present

## 2022-10-29 DIAGNOSIS — F0153 Vascular dementia, unspecified severity, with mood disturbance: Secondary | ICD-10-CM | POA: Diagnosis not present

## 2022-10-29 DIAGNOSIS — F01511 Vascular dementia, unspecified severity, with agitation: Secondary | ICD-10-CM | POA: Diagnosis not present

## 2022-10-30 DIAGNOSIS — F01511 Vascular dementia, unspecified severity, with agitation: Secondary | ICD-10-CM | POA: Diagnosis not present

## 2022-10-30 DIAGNOSIS — I4891 Unspecified atrial fibrillation: Secondary | ICD-10-CM | POA: Diagnosis not present

## 2022-10-30 DIAGNOSIS — I69818 Other symptoms and signs involving cognitive functions following other cerebrovascular disease: Secondary | ICD-10-CM | POA: Diagnosis not present

## 2022-10-30 DIAGNOSIS — R634 Abnormal weight loss: Secondary | ICD-10-CM | POA: Diagnosis not present

## 2022-10-30 DIAGNOSIS — F0153 Vascular dementia, unspecified severity, with mood disturbance: Secondary | ICD-10-CM | POA: Diagnosis not present

## 2022-10-30 DIAGNOSIS — G40909 Epilepsy, unspecified, not intractable, without status epilepticus: Secondary | ICD-10-CM | POA: Diagnosis not present

## 2022-10-31 DIAGNOSIS — G40909 Epilepsy, unspecified, not intractable, without status epilepticus: Secondary | ICD-10-CM | POA: Diagnosis not present

## 2022-10-31 DIAGNOSIS — I69818 Other symptoms and signs involving cognitive functions following other cerebrovascular disease: Secondary | ICD-10-CM | POA: Diagnosis not present

## 2022-10-31 DIAGNOSIS — F01511 Vascular dementia, unspecified severity, with agitation: Secondary | ICD-10-CM | POA: Diagnosis not present

## 2022-10-31 DIAGNOSIS — I4891 Unspecified atrial fibrillation: Secondary | ICD-10-CM | POA: Diagnosis not present

## 2022-10-31 DIAGNOSIS — R634 Abnormal weight loss: Secondary | ICD-10-CM | POA: Diagnosis not present

## 2022-10-31 DIAGNOSIS — F0153 Vascular dementia, unspecified severity, with mood disturbance: Secondary | ICD-10-CM | POA: Diagnosis not present

## 2022-11-01 DIAGNOSIS — I69818 Other symptoms and signs involving cognitive functions following other cerebrovascular disease: Secondary | ICD-10-CM | POA: Diagnosis not present

## 2022-11-01 DIAGNOSIS — I4891 Unspecified atrial fibrillation: Secondary | ICD-10-CM | POA: Diagnosis not present

## 2022-11-01 DIAGNOSIS — F01511 Vascular dementia, unspecified severity, with agitation: Secondary | ICD-10-CM | POA: Diagnosis not present

## 2022-11-01 DIAGNOSIS — F0153 Vascular dementia, unspecified severity, with mood disturbance: Secondary | ICD-10-CM | POA: Diagnosis not present

## 2022-11-01 DIAGNOSIS — G40909 Epilepsy, unspecified, not intractable, without status epilepticus: Secondary | ICD-10-CM | POA: Diagnosis not present

## 2022-11-01 DIAGNOSIS — R634 Abnormal weight loss: Secondary | ICD-10-CM | POA: Diagnosis not present

## 2022-11-03 DIAGNOSIS — R634 Abnormal weight loss: Secondary | ICD-10-CM | POA: Diagnosis not present

## 2022-11-03 DIAGNOSIS — F0153 Vascular dementia, unspecified severity, with mood disturbance: Secondary | ICD-10-CM | POA: Diagnosis not present

## 2022-11-03 DIAGNOSIS — F01511 Vascular dementia, unspecified severity, with agitation: Secondary | ICD-10-CM | POA: Diagnosis not present

## 2022-11-03 DIAGNOSIS — I4891 Unspecified atrial fibrillation: Secondary | ICD-10-CM | POA: Diagnosis not present

## 2022-11-03 DIAGNOSIS — G40909 Epilepsy, unspecified, not intractable, without status epilepticus: Secondary | ICD-10-CM | POA: Diagnosis not present

## 2022-11-03 DIAGNOSIS — I69818 Other symptoms and signs involving cognitive functions following other cerebrovascular disease: Secondary | ICD-10-CM | POA: Diagnosis not present

## 2022-11-04 DIAGNOSIS — I4891 Unspecified atrial fibrillation: Secondary | ICD-10-CM | POA: Diagnosis not present

## 2022-11-04 DIAGNOSIS — F01511 Vascular dementia, unspecified severity, with agitation: Secondary | ICD-10-CM | POA: Diagnosis not present

## 2022-11-04 DIAGNOSIS — F0153 Vascular dementia, unspecified severity, with mood disturbance: Secondary | ICD-10-CM | POA: Diagnosis not present

## 2022-11-04 DIAGNOSIS — R634 Abnormal weight loss: Secondary | ICD-10-CM | POA: Diagnosis not present

## 2022-11-04 DIAGNOSIS — I69818 Other symptoms and signs involving cognitive functions following other cerebrovascular disease: Secondary | ICD-10-CM | POA: Diagnosis not present

## 2022-11-04 DIAGNOSIS — G40909 Epilepsy, unspecified, not intractable, without status epilepticus: Secondary | ICD-10-CM | POA: Diagnosis not present

## 2022-11-06 DIAGNOSIS — F0153 Vascular dementia, unspecified severity, with mood disturbance: Secondary | ICD-10-CM | POA: Diagnosis not present

## 2022-11-06 DIAGNOSIS — G40909 Epilepsy, unspecified, not intractable, without status epilepticus: Secondary | ICD-10-CM | POA: Diagnosis not present

## 2022-11-06 DIAGNOSIS — I4891 Unspecified atrial fibrillation: Secondary | ICD-10-CM | POA: Diagnosis not present

## 2022-11-06 DIAGNOSIS — I69818 Other symptoms and signs involving cognitive functions following other cerebrovascular disease: Secondary | ICD-10-CM | POA: Diagnosis not present

## 2022-11-06 DIAGNOSIS — R634 Abnormal weight loss: Secondary | ICD-10-CM | POA: Diagnosis not present

## 2022-11-06 DIAGNOSIS — F01511 Vascular dementia, unspecified severity, with agitation: Secondary | ICD-10-CM | POA: Diagnosis not present

## 2022-11-10 DIAGNOSIS — I4891 Unspecified atrial fibrillation: Secondary | ICD-10-CM | POA: Diagnosis not present

## 2022-11-10 DIAGNOSIS — I69818 Other symptoms and signs involving cognitive functions following other cerebrovascular disease: Secondary | ICD-10-CM | POA: Diagnosis not present

## 2022-11-10 DIAGNOSIS — F01511 Vascular dementia, unspecified severity, with agitation: Secondary | ICD-10-CM | POA: Diagnosis not present

## 2022-11-10 DIAGNOSIS — R634 Abnormal weight loss: Secondary | ICD-10-CM | POA: Diagnosis not present

## 2022-11-10 DIAGNOSIS — F0153 Vascular dementia, unspecified severity, with mood disturbance: Secondary | ICD-10-CM | POA: Diagnosis not present

## 2022-11-10 DIAGNOSIS — G40909 Epilepsy, unspecified, not intractable, without status epilepticus: Secondary | ICD-10-CM | POA: Diagnosis not present

## 2022-11-11 DIAGNOSIS — I69818 Other symptoms and signs involving cognitive functions following other cerebrovascular disease: Secondary | ICD-10-CM | POA: Diagnosis not present

## 2022-11-11 DIAGNOSIS — F0153 Vascular dementia, unspecified severity, with mood disturbance: Secondary | ICD-10-CM | POA: Diagnosis not present

## 2022-11-11 DIAGNOSIS — G40909 Epilepsy, unspecified, not intractable, without status epilepticus: Secondary | ICD-10-CM | POA: Diagnosis not present

## 2022-11-11 DIAGNOSIS — R634 Abnormal weight loss: Secondary | ICD-10-CM | POA: Diagnosis not present

## 2022-11-11 DIAGNOSIS — I4891 Unspecified atrial fibrillation: Secondary | ICD-10-CM | POA: Diagnosis not present

## 2022-11-11 DIAGNOSIS — F01511 Vascular dementia, unspecified severity, with agitation: Secondary | ICD-10-CM | POA: Diagnosis not present

## 2022-11-13 DIAGNOSIS — G40909 Epilepsy, unspecified, not intractable, without status epilepticus: Secondary | ICD-10-CM | POA: Diagnosis not present

## 2022-11-13 DIAGNOSIS — R634 Abnormal weight loss: Secondary | ICD-10-CM | POA: Diagnosis not present

## 2022-11-13 DIAGNOSIS — F01511 Vascular dementia, unspecified severity, with agitation: Secondary | ICD-10-CM | POA: Diagnosis not present

## 2022-11-13 DIAGNOSIS — F0153 Vascular dementia, unspecified severity, with mood disturbance: Secondary | ICD-10-CM | POA: Diagnosis not present

## 2022-11-13 DIAGNOSIS — I4891 Unspecified atrial fibrillation: Secondary | ICD-10-CM | POA: Diagnosis not present

## 2022-11-13 DIAGNOSIS — I69818 Other symptoms and signs involving cognitive functions following other cerebrovascular disease: Secondary | ICD-10-CM | POA: Diagnosis not present

## 2022-11-17 DIAGNOSIS — F0153 Vascular dementia, unspecified severity, with mood disturbance: Secondary | ICD-10-CM | POA: Diagnosis not present

## 2022-11-17 DIAGNOSIS — G40909 Epilepsy, unspecified, not intractable, without status epilepticus: Secondary | ICD-10-CM | POA: Diagnosis not present

## 2022-11-17 DIAGNOSIS — R634 Abnormal weight loss: Secondary | ICD-10-CM | POA: Diagnosis not present

## 2022-11-17 DIAGNOSIS — I4891 Unspecified atrial fibrillation: Secondary | ICD-10-CM | POA: Diagnosis not present

## 2022-11-17 DIAGNOSIS — I2510 Atherosclerotic heart disease of native coronary artery without angina pectoris: Secondary | ICD-10-CM | POA: Diagnosis not present

## 2022-11-17 DIAGNOSIS — I69818 Other symptoms and signs involving cognitive functions following other cerebrovascular disease: Secondary | ICD-10-CM | POA: Diagnosis not present

## 2022-11-17 DIAGNOSIS — I739 Peripheral vascular disease, unspecified: Secondary | ICD-10-CM | POA: Diagnosis not present

## 2022-11-17 DIAGNOSIS — Z950 Presence of cardiac pacemaker: Secondary | ICD-10-CM | POA: Diagnosis not present

## 2022-11-17 DIAGNOSIS — I10 Essential (primary) hypertension: Secondary | ICD-10-CM | POA: Diagnosis not present

## 2022-11-17 DIAGNOSIS — I495 Sick sinus syndrome: Secondary | ICD-10-CM | POA: Diagnosis not present

## 2022-11-17 DIAGNOSIS — M1990 Unspecified osteoarthritis, unspecified site: Secondary | ICD-10-CM | POA: Diagnosis not present

## 2022-11-17 DIAGNOSIS — F01511 Vascular dementia, unspecified severity, with agitation: Secondary | ICD-10-CM | POA: Diagnosis not present

## 2022-11-17 DIAGNOSIS — K219 Gastro-esophageal reflux disease without esophagitis: Secondary | ICD-10-CM | POA: Diagnosis not present

## 2022-11-17 DIAGNOSIS — Z87440 Personal history of urinary (tract) infections: Secondary | ICD-10-CM | POA: Diagnosis not present

## 2022-11-18 DIAGNOSIS — I69818 Other symptoms and signs involving cognitive functions following other cerebrovascular disease: Secondary | ICD-10-CM | POA: Diagnosis not present

## 2022-11-18 DIAGNOSIS — F0153 Vascular dementia, unspecified severity, with mood disturbance: Secondary | ICD-10-CM | POA: Diagnosis not present

## 2022-11-18 DIAGNOSIS — R634 Abnormal weight loss: Secondary | ICD-10-CM | POA: Diagnosis not present

## 2022-11-18 DIAGNOSIS — G40909 Epilepsy, unspecified, not intractable, without status epilepticus: Secondary | ICD-10-CM | POA: Diagnosis not present

## 2022-11-18 DIAGNOSIS — F01511 Vascular dementia, unspecified severity, with agitation: Secondary | ICD-10-CM | POA: Diagnosis not present

## 2022-11-18 DIAGNOSIS — I4891 Unspecified atrial fibrillation: Secondary | ICD-10-CM | POA: Diagnosis not present

## 2022-11-20 DIAGNOSIS — G40909 Epilepsy, unspecified, not intractable, without status epilepticus: Secondary | ICD-10-CM | POA: Diagnosis not present

## 2022-11-20 DIAGNOSIS — F0153 Vascular dementia, unspecified severity, with mood disturbance: Secondary | ICD-10-CM | POA: Diagnosis not present

## 2022-11-20 DIAGNOSIS — R634 Abnormal weight loss: Secondary | ICD-10-CM | POA: Diagnosis not present

## 2022-11-20 DIAGNOSIS — F01511 Vascular dementia, unspecified severity, with agitation: Secondary | ICD-10-CM | POA: Diagnosis not present

## 2022-11-20 DIAGNOSIS — I69818 Other symptoms and signs involving cognitive functions following other cerebrovascular disease: Secondary | ICD-10-CM | POA: Diagnosis not present

## 2022-11-20 DIAGNOSIS — I4891 Unspecified atrial fibrillation: Secondary | ICD-10-CM | POA: Diagnosis not present

## 2022-11-24 DIAGNOSIS — F01511 Vascular dementia, unspecified severity, with agitation: Secondary | ICD-10-CM | POA: Diagnosis not present

## 2022-11-24 DIAGNOSIS — R634 Abnormal weight loss: Secondary | ICD-10-CM | POA: Diagnosis not present

## 2022-11-24 DIAGNOSIS — I69818 Other symptoms and signs involving cognitive functions following other cerebrovascular disease: Secondary | ICD-10-CM | POA: Diagnosis not present

## 2022-11-24 DIAGNOSIS — F0153 Vascular dementia, unspecified severity, with mood disturbance: Secondary | ICD-10-CM | POA: Diagnosis not present

## 2022-11-24 DIAGNOSIS — I4891 Unspecified atrial fibrillation: Secondary | ICD-10-CM | POA: Diagnosis not present

## 2022-11-24 DIAGNOSIS — G40909 Epilepsy, unspecified, not intractable, without status epilepticus: Secondary | ICD-10-CM | POA: Diagnosis not present

## 2022-11-25 DIAGNOSIS — F0153 Vascular dementia, unspecified severity, with mood disturbance: Secondary | ICD-10-CM | POA: Diagnosis not present

## 2022-11-25 DIAGNOSIS — F01511 Vascular dementia, unspecified severity, with agitation: Secondary | ICD-10-CM | POA: Diagnosis not present

## 2022-11-25 DIAGNOSIS — I4891 Unspecified atrial fibrillation: Secondary | ICD-10-CM | POA: Diagnosis not present

## 2022-11-25 DIAGNOSIS — I69818 Other symptoms and signs involving cognitive functions following other cerebrovascular disease: Secondary | ICD-10-CM | POA: Diagnosis not present

## 2022-11-25 DIAGNOSIS — R634 Abnormal weight loss: Secondary | ICD-10-CM | POA: Diagnosis not present

## 2022-11-25 DIAGNOSIS — G40909 Epilepsy, unspecified, not intractable, without status epilepticus: Secondary | ICD-10-CM | POA: Diagnosis not present

## 2022-11-27 DIAGNOSIS — I4891 Unspecified atrial fibrillation: Secondary | ICD-10-CM | POA: Diagnosis not present

## 2022-11-27 DIAGNOSIS — F01511 Vascular dementia, unspecified severity, with agitation: Secondary | ICD-10-CM | POA: Diagnosis not present

## 2022-11-27 DIAGNOSIS — R634 Abnormal weight loss: Secondary | ICD-10-CM | POA: Diagnosis not present

## 2022-11-27 DIAGNOSIS — G40909 Epilepsy, unspecified, not intractable, without status epilepticus: Secondary | ICD-10-CM | POA: Diagnosis not present

## 2022-11-27 DIAGNOSIS — F0153 Vascular dementia, unspecified severity, with mood disturbance: Secondary | ICD-10-CM | POA: Diagnosis not present

## 2022-11-27 DIAGNOSIS — I69818 Other symptoms and signs involving cognitive functions following other cerebrovascular disease: Secondary | ICD-10-CM | POA: Diagnosis not present

## 2022-11-28 DIAGNOSIS — R634 Abnormal weight loss: Secondary | ICD-10-CM | POA: Diagnosis not present

## 2022-11-28 DIAGNOSIS — F01511 Vascular dementia, unspecified severity, with agitation: Secondary | ICD-10-CM | POA: Diagnosis not present

## 2022-11-28 DIAGNOSIS — I69818 Other symptoms and signs involving cognitive functions following other cerebrovascular disease: Secondary | ICD-10-CM | POA: Diagnosis not present

## 2022-11-28 DIAGNOSIS — I4891 Unspecified atrial fibrillation: Secondary | ICD-10-CM | POA: Diagnosis not present

## 2022-11-28 DIAGNOSIS — F0153 Vascular dementia, unspecified severity, with mood disturbance: Secondary | ICD-10-CM | POA: Diagnosis not present

## 2022-11-28 DIAGNOSIS — G40909 Epilepsy, unspecified, not intractable, without status epilepticus: Secondary | ICD-10-CM | POA: Diagnosis not present

## 2022-12-01 DIAGNOSIS — I4891 Unspecified atrial fibrillation: Secondary | ICD-10-CM | POA: Diagnosis not present

## 2022-12-01 DIAGNOSIS — G40909 Epilepsy, unspecified, not intractable, without status epilepticus: Secondary | ICD-10-CM | POA: Diagnosis not present

## 2022-12-01 DIAGNOSIS — I69818 Other symptoms and signs involving cognitive functions following other cerebrovascular disease: Secondary | ICD-10-CM | POA: Diagnosis not present

## 2022-12-01 DIAGNOSIS — F0153 Vascular dementia, unspecified severity, with mood disturbance: Secondary | ICD-10-CM | POA: Diagnosis not present

## 2022-12-01 DIAGNOSIS — F01511 Vascular dementia, unspecified severity, with agitation: Secondary | ICD-10-CM | POA: Diagnosis not present

## 2022-12-01 DIAGNOSIS — R634 Abnormal weight loss: Secondary | ICD-10-CM | POA: Diagnosis not present

## 2022-12-02 DIAGNOSIS — F01511 Vascular dementia, unspecified severity, with agitation: Secondary | ICD-10-CM | POA: Diagnosis not present

## 2022-12-02 DIAGNOSIS — I4891 Unspecified atrial fibrillation: Secondary | ICD-10-CM | POA: Diagnosis not present

## 2022-12-02 DIAGNOSIS — I69818 Other symptoms and signs involving cognitive functions following other cerebrovascular disease: Secondary | ICD-10-CM | POA: Diagnosis not present

## 2022-12-02 DIAGNOSIS — F0153 Vascular dementia, unspecified severity, with mood disturbance: Secondary | ICD-10-CM | POA: Diagnosis not present

## 2022-12-02 DIAGNOSIS — G40909 Epilepsy, unspecified, not intractable, without status epilepticus: Secondary | ICD-10-CM | POA: Diagnosis not present

## 2022-12-02 DIAGNOSIS — R634 Abnormal weight loss: Secondary | ICD-10-CM | POA: Diagnosis not present

## 2022-12-06 DIAGNOSIS — F0153 Vascular dementia, unspecified severity, with mood disturbance: Secondary | ICD-10-CM | POA: Diagnosis not present

## 2022-12-06 DIAGNOSIS — I69818 Other symptoms and signs involving cognitive functions following other cerebrovascular disease: Secondary | ICD-10-CM | POA: Diagnosis not present

## 2022-12-06 DIAGNOSIS — I4891 Unspecified atrial fibrillation: Secondary | ICD-10-CM | POA: Diagnosis not present

## 2022-12-06 DIAGNOSIS — G40909 Epilepsy, unspecified, not intractable, without status epilepticus: Secondary | ICD-10-CM | POA: Diagnosis not present

## 2022-12-06 DIAGNOSIS — R634 Abnormal weight loss: Secondary | ICD-10-CM | POA: Diagnosis not present

## 2022-12-06 DIAGNOSIS — F01511 Vascular dementia, unspecified severity, with agitation: Secondary | ICD-10-CM | POA: Diagnosis not present

## 2022-12-08 DIAGNOSIS — I4891 Unspecified atrial fibrillation: Secondary | ICD-10-CM | POA: Diagnosis not present

## 2022-12-08 DIAGNOSIS — R634 Abnormal weight loss: Secondary | ICD-10-CM | POA: Diagnosis not present

## 2022-12-08 DIAGNOSIS — F01511 Vascular dementia, unspecified severity, with agitation: Secondary | ICD-10-CM | POA: Diagnosis not present

## 2022-12-08 DIAGNOSIS — F0153 Vascular dementia, unspecified severity, with mood disturbance: Secondary | ICD-10-CM | POA: Diagnosis not present

## 2022-12-08 DIAGNOSIS — I69818 Other symptoms and signs involving cognitive functions following other cerebrovascular disease: Secondary | ICD-10-CM | POA: Diagnosis not present

## 2022-12-08 DIAGNOSIS — G40909 Epilepsy, unspecified, not intractable, without status epilepticus: Secondary | ICD-10-CM | POA: Diagnosis not present

## 2022-12-09 DIAGNOSIS — F0153 Vascular dementia, unspecified severity, with mood disturbance: Secondary | ICD-10-CM | POA: Diagnosis not present

## 2022-12-09 DIAGNOSIS — I4891 Unspecified atrial fibrillation: Secondary | ICD-10-CM | POA: Diagnosis not present

## 2022-12-09 DIAGNOSIS — R634 Abnormal weight loss: Secondary | ICD-10-CM | POA: Diagnosis not present

## 2022-12-09 DIAGNOSIS — G40909 Epilepsy, unspecified, not intractable, without status epilepticus: Secondary | ICD-10-CM | POA: Diagnosis not present

## 2022-12-09 DIAGNOSIS — I69818 Other symptoms and signs involving cognitive functions following other cerebrovascular disease: Secondary | ICD-10-CM | POA: Diagnosis not present

## 2022-12-09 DIAGNOSIS — F01511 Vascular dementia, unspecified severity, with agitation: Secondary | ICD-10-CM | POA: Diagnosis not present

## 2022-12-11 DIAGNOSIS — R634 Abnormal weight loss: Secondary | ICD-10-CM | POA: Diagnosis not present

## 2022-12-11 DIAGNOSIS — G40909 Epilepsy, unspecified, not intractable, without status epilepticus: Secondary | ICD-10-CM | POA: Diagnosis not present

## 2022-12-11 DIAGNOSIS — F0153 Vascular dementia, unspecified severity, with mood disturbance: Secondary | ICD-10-CM | POA: Diagnosis not present

## 2022-12-11 DIAGNOSIS — I4891 Unspecified atrial fibrillation: Secondary | ICD-10-CM | POA: Diagnosis not present

## 2022-12-11 DIAGNOSIS — F01511 Vascular dementia, unspecified severity, with agitation: Secondary | ICD-10-CM | POA: Diagnosis not present

## 2022-12-11 DIAGNOSIS — I69818 Other symptoms and signs involving cognitive functions following other cerebrovascular disease: Secondary | ICD-10-CM | POA: Diagnosis not present

## 2022-12-12 DIAGNOSIS — F0153 Vascular dementia, unspecified severity, with mood disturbance: Secondary | ICD-10-CM | POA: Diagnosis not present

## 2022-12-12 DIAGNOSIS — I4891 Unspecified atrial fibrillation: Secondary | ICD-10-CM | POA: Diagnosis not present

## 2022-12-12 DIAGNOSIS — F01511 Vascular dementia, unspecified severity, with agitation: Secondary | ICD-10-CM | POA: Diagnosis not present

## 2022-12-12 DIAGNOSIS — I69818 Other symptoms and signs involving cognitive functions following other cerebrovascular disease: Secondary | ICD-10-CM | POA: Diagnosis not present

## 2022-12-12 DIAGNOSIS — G40909 Epilepsy, unspecified, not intractable, without status epilepticus: Secondary | ICD-10-CM | POA: Diagnosis not present

## 2022-12-12 DIAGNOSIS — R634 Abnormal weight loss: Secondary | ICD-10-CM | POA: Diagnosis not present

## 2022-12-13 DIAGNOSIS — G40909 Epilepsy, unspecified, not intractable, without status epilepticus: Secondary | ICD-10-CM | POA: Diagnosis not present

## 2022-12-13 DIAGNOSIS — R634 Abnormal weight loss: Secondary | ICD-10-CM | POA: Diagnosis not present

## 2022-12-13 DIAGNOSIS — I69818 Other symptoms and signs involving cognitive functions following other cerebrovascular disease: Secondary | ICD-10-CM | POA: Diagnosis not present

## 2022-12-13 DIAGNOSIS — F0153 Vascular dementia, unspecified severity, with mood disturbance: Secondary | ICD-10-CM | POA: Diagnosis not present

## 2022-12-13 DIAGNOSIS — F01511 Vascular dementia, unspecified severity, with agitation: Secondary | ICD-10-CM | POA: Diagnosis not present

## 2022-12-13 DIAGNOSIS — I4891 Unspecified atrial fibrillation: Secondary | ICD-10-CM | POA: Diagnosis not present

## 2022-12-14 DIAGNOSIS — F0153 Vascular dementia, unspecified severity, with mood disturbance: Secondary | ICD-10-CM | POA: Diagnosis not present

## 2022-12-14 DIAGNOSIS — I69818 Other symptoms and signs involving cognitive functions following other cerebrovascular disease: Secondary | ICD-10-CM | POA: Diagnosis not present

## 2022-12-14 DIAGNOSIS — F01511 Vascular dementia, unspecified severity, with agitation: Secondary | ICD-10-CM | POA: Diagnosis not present

## 2022-12-14 DIAGNOSIS — G40909 Epilepsy, unspecified, not intractable, without status epilepticus: Secondary | ICD-10-CM | POA: Diagnosis not present

## 2022-12-14 DIAGNOSIS — R634 Abnormal weight loss: Secondary | ICD-10-CM | POA: Diagnosis not present

## 2022-12-14 DIAGNOSIS — I4891 Unspecified atrial fibrillation: Secondary | ICD-10-CM | POA: Diagnosis not present

## 2022-12-15 DIAGNOSIS — F0153 Vascular dementia, unspecified severity, with mood disturbance: Secondary | ICD-10-CM | POA: Diagnosis not present

## 2022-12-15 DIAGNOSIS — F01511 Vascular dementia, unspecified severity, with agitation: Secondary | ICD-10-CM | POA: Diagnosis not present

## 2022-12-15 DIAGNOSIS — G40909 Epilepsy, unspecified, not intractable, without status epilepticus: Secondary | ICD-10-CM | POA: Diagnosis not present

## 2022-12-15 DIAGNOSIS — I4891 Unspecified atrial fibrillation: Secondary | ICD-10-CM | POA: Diagnosis not present

## 2022-12-15 DIAGNOSIS — I69818 Other symptoms and signs involving cognitive functions following other cerebrovascular disease: Secondary | ICD-10-CM | POA: Diagnosis not present

## 2022-12-15 DIAGNOSIS — R634 Abnormal weight loss: Secondary | ICD-10-CM | POA: Diagnosis not present

## 2022-12-16 DIAGNOSIS — I4891 Unspecified atrial fibrillation: Secondary | ICD-10-CM | POA: Diagnosis not present

## 2022-12-16 DIAGNOSIS — G40909 Epilepsy, unspecified, not intractable, without status epilepticus: Secondary | ICD-10-CM | POA: Diagnosis not present

## 2022-12-16 DIAGNOSIS — I69818 Other symptoms and signs involving cognitive functions following other cerebrovascular disease: Secondary | ICD-10-CM | POA: Diagnosis not present

## 2022-12-16 DIAGNOSIS — F0153 Vascular dementia, unspecified severity, with mood disturbance: Secondary | ICD-10-CM | POA: Diagnosis not present

## 2022-12-16 DIAGNOSIS — R634 Abnormal weight loss: Secondary | ICD-10-CM | POA: Diagnosis not present

## 2022-12-16 DIAGNOSIS — F01511 Vascular dementia, unspecified severity, with agitation: Secondary | ICD-10-CM | POA: Diagnosis not present

## 2022-12-18 DIAGNOSIS — I2510 Atherosclerotic heart disease of native coronary artery without angina pectoris: Secondary | ICD-10-CM | POA: Diagnosis not present

## 2022-12-18 DIAGNOSIS — Z87440 Personal history of urinary (tract) infections: Secondary | ICD-10-CM | POA: Diagnosis not present

## 2022-12-18 DIAGNOSIS — R634 Abnormal weight loss: Secondary | ICD-10-CM | POA: Diagnosis not present

## 2022-12-18 DIAGNOSIS — I4891 Unspecified atrial fibrillation: Secondary | ICD-10-CM | POA: Diagnosis not present

## 2022-12-18 DIAGNOSIS — L89152 Pressure ulcer of sacral region, stage 2: Secondary | ICD-10-CM | POA: Diagnosis not present

## 2022-12-18 DIAGNOSIS — I739 Peripheral vascular disease, unspecified: Secondary | ICD-10-CM | POA: Diagnosis not present

## 2022-12-18 DIAGNOSIS — I69318 Other symptoms and signs involving cognitive functions following cerebral infarction: Secondary | ICD-10-CM | POA: Diagnosis not present

## 2022-12-18 DIAGNOSIS — G40909 Epilepsy, unspecified, not intractable, without status epilepticus: Secondary | ICD-10-CM | POA: Diagnosis not present

## 2022-12-18 DIAGNOSIS — F0154 Vascular dementia, unspecified severity, with anxiety: Secondary | ICD-10-CM | POA: Diagnosis not present

## 2022-12-18 DIAGNOSIS — R627 Adult failure to thrive: Secondary | ICD-10-CM | POA: Diagnosis not present

## 2022-12-18 DIAGNOSIS — E785 Hyperlipidemia, unspecified: Secondary | ICD-10-CM | POA: Diagnosis not present

## 2022-12-18 DIAGNOSIS — J9691 Respiratory failure, unspecified with hypoxia: Secondary | ICD-10-CM | POA: Diagnosis not present

## 2022-12-18 DIAGNOSIS — K219 Gastro-esophageal reflux disease without esophagitis: Secondary | ICD-10-CM | POA: Diagnosis not present

## 2022-12-18 DIAGNOSIS — Z950 Presence of cardiac pacemaker: Secondary | ICD-10-CM | POA: Diagnosis not present

## 2022-12-18 DIAGNOSIS — I69354 Hemiplegia and hemiparesis following cerebral infarction affecting left non-dominant side: Secondary | ICD-10-CM | POA: Diagnosis not present

## 2022-12-18 DIAGNOSIS — F0153 Vascular dementia, unspecified severity, with mood disturbance: Secondary | ICD-10-CM | POA: Diagnosis not present

## 2022-12-18 DIAGNOSIS — I495 Sick sinus syndrome: Secondary | ICD-10-CM | POA: Diagnosis not present

## 2022-12-18 DIAGNOSIS — L308 Other specified dermatitis: Secondary | ICD-10-CM | POA: Diagnosis not present

## 2022-12-18 DIAGNOSIS — M1990 Unspecified osteoarthritis, unspecified site: Secondary | ICD-10-CM | POA: Diagnosis not present

## 2022-12-18 DIAGNOSIS — D649 Anemia, unspecified: Secondary | ICD-10-CM | POA: Diagnosis not present

## 2022-12-18 DIAGNOSIS — I69391 Dysphagia following cerebral infarction: Secondary | ICD-10-CM | POA: Diagnosis not present

## 2022-12-18 DIAGNOSIS — F01511 Vascular dementia, unspecified severity, with agitation: Secondary | ICD-10-CM | POA: Diagnosis not present

## 2022-12-18 DIAGNOSIS — I10 Essential (primary) hypertension: Secondary | ICD-10-CM | POA: Diagnosis not present

## 2022-12-18 DIAGNOSIS — Z9981 Dependence on supplemental oxygen: Secondary | ICD-10-CM | POA: Diagnosis not present

## 2022-12-22 DIAGNOSIS — I69354 Hemiplegia and hemiparesis following cerebral infarction affecting left non-dominant side: Secondary | ICD-10-CM | POA: Diagnosis not present

## 2022-12-22 DIAGNOSIS — F0154 Vascular dementia, unspecified severity, with anxiety: Secondary | ICD-10-CM | POA: Diagnosis not present

## 2022-12-22 DIAGNOSIS — I69318 Other symptoms and signs involving cognitive functions following cerebral infarction: Secondary | ICD-10-CM | POA: Diagnosis not present

## 2022-12-22 DIAGNOSIS — F01511 Vascular dementia, unspecified severity, with agitation: Secondary | ICD-10-CM | POA: Diagnosis not present

## 2022-12-22 DIAGNOSIS — I69391 Dysphagia following cerebral infarction: Secondary | ICD-10-CM | POA: Diagnosis not present

## 2022-12-22 DIAGNOSIS — F0153 Vascular dementia, unspecified severity, with mood disturbance: Secondary | ICD-10-CM | POA: Diagnosis not present

## 2022-12-23 DIAGNOSIS — F01511 Vascular dementia, unspecified severity, with agitation: Secondary | ICD-10-CM | POA: Diagnosis not present

## 2022-12-23 DIAGNOSIS — I69318 Other symptoms and signs involving cognitive functions following cerebral infarction: Secondary | ICD-10-CM | POA: Diagnosis not present

## 2022-12-23 DIAGNOSIS — F0153 Vascular dementia, unspecified severity, with mood disturbance: Secondary | ICD-10-CM | POA: Diagnosis not present

## 2022-12-23 DIAGNOSIS — I69391 Dysphagia following cerebral infarction: Secondary | ICD-10-CM | POA: Diagnosis not present

## 2022-12-23 DIAGNOSIS — I69354 Hemiplegia and hemiparesis following cerebral infarction affecting left non-dominant side: Secondary | ICD-10-CM | POA: Diagnosis not present

## 2022-12-23 DIAGNOSIS — F0154 Vascular dementia, unspecified severity, with anxiety: Secondary | ICD-10-CM | POA: Diagnosis not present

## 2022-12-25 DIAGNOSIS — I69354 Hemiplegia and hemiparesis following cerebral infarction affecting left non-dominant side: Secondary | ICD-10-CM | POA: Diagnosis not present

## 2022-12-25 DIAGNOSIS — F0153 Vascular dementia, unspecified severity, with mood disturbance: Secondary | ICD-10-CM | POA: Diagnosis not present

## 2022-12-25 DIAGNOSIS — F01511 Vascular dementia, unspecified severity, with agitation: Secondary | ICD-10-CM | POA: Diagnosis not present

## 2022-12-25 DIAGNOSIS — I69391 Dysphagia following cerebral infarction: Secondary | ICD-10-CM | POA: Diagnosis not present

## 2022-12-25 DIAGNOSIS — I69318 Other symptoms and signs involving cognitive functions following cerebral infarction: Secondary | ICD-10-CM | POA: Diagnosis not present

## 2022-12-25 DIAGNOSIS — F0154 Vascular dementia, unspecified severity, with anxiety: Secondary | ICD-10-CM | POA: Diagnosis not present

## 2022-12-26 DIAGNOSIS — I69391 Dysphagia following cerebral infarction: Secondary | ICD-10-CM | POA: Diagnosis not present

## 2022-12-26 DIAGNOSIS — I69354 Hemiplegia and hemiparesis following cerebral infarction affecting left non-dominant side: Secondary | ICD-10-CM | POA: Diagnosis not present

## 2022-12-26 DIAGNOSIS — I69318 Other symptoms and signs involving cognitive functions following cerebral infarction: Secondary | ICD-10-CM | POA: Diagnosis not present

## 2022-12-26 DIAGNOSIS — F01511 Vascular dementia, unspecified severity, with agitation: Secondary | ICD-10-CM | POA: Diagnosis not present

## 2022-12-26 DIAGNOSIS — F0154 Vascular dementia, unspecified severity, with anxiety: Secondary | ICD-10-CM | POA: Diagnosis not present

## 2022-12-26 DIAGNOSIS — F0153 Vascular dementia, unspecified severity, with mood disturbance: Secondary | ICD-10-CM | POA: Diagnosis not present

## 2022-12-30 DIAGNOSIS — I69354 Hemiplegia and hemiparesis following cerebral infarction affecting left non-dominant side: Secondary | ICD-10-CM | POA: Diagnosis not present

## 2022-12-30 DIAGNOSIS — F01511 Vascular dementia, unspecified severity, with agitation: Secondary | ICD-10-CM | POA: Diagnosis not present

## 2022-12-30 DIAGNOSIS — I69391 Dysphagia following cerebral infarction: Secondary | ICD-10-CM | POA: Diagnosis not present

## 2022-12-30 DIAGNOSIS — F0154 Vascular dementia, unspecified severity, with anxiety: Secondary | ICD-10-CM | POA: Diagnosis not present

## 2022-12-30 DIAGNOSIS — I69318 Other symptoms and signs involving cognitive functions following cerebral infarction: Secondary | ICD-10-CM | POA: Diagnosis not present

## 2022-12-30 DIAGNOSIS — F0153 Vascular dementia, unspecified severity, with mood disturbance: Secondary | ICD-10-CM | POA: Diagnosis not present

## 2022-12-31 DIAGNOSIS — F0154 Vascular dementia, unspecified severity, with anxiety: Secondary | ICD-10-CM | POA: Diagnosis not present

## 2022-12-31 DIAGNOSIS — F0153 Vascular dementia, unspecified severity, with mood disturbance: Secondary | ICD-10-CM | POA: Diagnosis not present

## 2022-12-31 DIAGNOSIS — F01511 Vascular dementia, unspecified severity, with agitation: Secondary | ICD-10-CM | POA: Diagnosis not present

## 2022-12-31 DIAGNOSIS — I69354 Hemiplegia and hemiparesis following cerebral infarction affecting left non-dominant side: Secondary | ICD-10-CM | POA: Diagnosis not present

## 2022-12-31 DIAGNOSIS — I69318 Other symptoms and signs involving cognitive functions following cerebral infarction: Secondary | ICD-10-CM | POA: Diagnosis not present

## 2022-12-31 DIAGNOSIS — I69391 Dysphagia following cerebral infarction: Secondary | ICD-10-CM | POA: Diagnosis not present

## 2023-01-01 DIAGNOSIS — I69354 Hemiplegia and hemiparesis following cerebral infarction affecting left non-dominant side: Secondary | ICD-10-CM | POA: Diagnosis not present

## 2023-01-01 DIAGNOSIS — F01511 Vascular dementia, unspecified severity, with agitation: Secondary | ICD-10-CM | POA: Diagnosis not present

## 2023-01-01 DIAGNOSIS — I69318 Other symptoms and signs involving cognitive functions following cerebral infarction: Secondary | ICD-10-CM | POA: Diagnosis not present

## 2023-01-01 DIAGNOSIS — F0153 Vascular dementia, unspecified severity, with mood disturbance: Secondary | ICD-10-CM | POA: Diagnosis not present

## 2023-01-01 DIAGNOSIS — F0154 Vascular dementia, unspecified severity, with anxiety: Secondary | ICD-10-CM | POA: Diagnosis not present

## 2023-01-01 DIAGNOSIS — I69391 Dysphagia following cerebral infarction: Secondary | ICD-10-CM | POA: Diagnosis not present

## 2023-01-06 ENCOUNTER — Ambulatory Visit (INDEPENDENT_AMBULATORY_CARE_PROVIDER_SITE_OTHER): Payer: BLUE CROSS/BLUE SHIELD | Admitting: Vascular Surgery

## 2023-01-06 ENCOUNTER — Encounter (INDEPENDENT_AMBULATORY_CARE_PROVIDER_SITE_OTHER): Payer: Medicare Other

## 2023-01-06 DIAGNOSIS — I69318 Other symptoms and signs involving cognitive functions following cerebral infarction: Secondary | ICD-10-CM | POA: Diagnosis not present

## 2023-01-06 DIAGNOSIS — F0154 Vascular dementia, unspecified severity, with anxiety: Secondary | ICD-10-CM | POA: Diagnosis not present

## 2023-01-06 DIAGNOSIS — I69354 Hemiplegia and hemiparesis following cerebral infarction affecting left non-dominant side: Secondary | ICD-10-CM | POA: Diagnosis not present

## 2023-01-06 DIAGNOSIS — I69391 Dysphagia following cerebral infarction: Secondary | ICD-10-CM | POA: Diagnosis not present

## 2023-01-06 DIAGNOSIS — F0153 Vascular dementia, unspecified severity, with mood disturbance: Secondary | ICD-10-CM | POA: Diagnosis not present

## 2023-01-06 DIAGNOSIS — F01511 Vascular dementia, unspecified severity, with agitation: Secondary | ICD-10-CM | POA: Diagnosis not present

## 2023-01-08 DIAGNOSIS — I69318 Other symptoms and signs involving cognitive functions following cerebral infarction: Secondary | ICD-10-CM | POA: Diagnosis not present

## 2023-01-08 DIAGNOSIS — F01511 Vascular dementia, unspecified severity, with agitation: Secondary | ICD-10-CM | POA: Diagnosis not present

## 2023-01-08 DIAGNOSIS — F0153 Vascular dementia, unspecified severity, with mood disturbance: Secondary | ICD-10-CM | POA: Diagnosis not present

## 2023-01-08 DIAGNOSIS — I69391 Dysphagia following cerebral infarction: Secondary | ICD-10-CM | POA: Diagnosis not present

## 2023-01-08 DIAGNOSIS — I69354 Hemiplegia and hemiparesis following cerebral infarction affecting left non-dominant side: Secondary | ICD-10-CM | POA: Diagnosis not present

## 2023-01-08 DIAGNOSIS — F0154 Vascular dementia, unspecified severity, with anxiety: Secondary | ICD-10-CM | POA: Diagnosis not present

## 2023-01-09 DIAGNOSIS — I69354 Hemiplegia and hemiparesis following cerebral infarction affecting left non-dominant side: Secondary | ICD-10-CM | POA: Diagnosis not present

## 2023-01-09 DIAGNOSIS — I69391 Dysphagia following cerebral infarction: Secondary | ICD-10-CM | POA: Diagnosis not present

## 2023-01-09 DIAGNOSIS — F01511 Vascular dementia, unspecified severity, with agitation: Secondary | ICD-10-CM | POA: Diagnosis not present

## 2023-01-09 DIAGNOSIS — I69318 Other symptoms and signs involving cognitive functions following cerebral infarction: Secondary | ICD-10-CM | POA: Diagnosis not present

## 2023-01-09 DIAGNOSIS — F0153 Vascular dementia, unspecified severity, with mood disturbance: Secondary | ICD-10-CM | POA: Diagnosis not present

## 2023-01-09 DIAGNOSIS — F0154 Vascular dementia, unspecified severity, with anxiety: Secondary | ICD-10-CM | POA: Diagnosis not present

## 2023-01-13 DIAGNOSIS — F01511 Vascular dementia, unspecified severity, with agitation: Secondary | ICD-10-CM | POA: Diagnosis not present

## 2023-01-13 DIAGNOSIS — I69391 Dysphagia following cerebral infarction: Secondary | ICD-10-CM | POA: Diagnosis not present

## 2023-01-13 DIAGNOSIS — I69354 Hemiplegia and hemiparesis following cerebral infarction affecting left non-dominant side: Secondary | ICD-10-CM | POA: Diagnosis not present

## 2023-01-13 DIAGNOSIS — F0154 Vascular dementia, unspecified severity, with anxiety: Secondary | ICD-10-CM | POA: Diagnosis not present

## 2023-01-13 DIAGNOSIS — F0153 Vascular dementia, unspecified severity, with mood disturbance: Secondary | ICD-10-CM | POA: Diagnosis not present

## 2023-01-13 DIAGNOSIS — I69318 Other symptoms and signs involving cognitive functions following cerebral infarction: Secondary | ICD-10-CM | POA: Diagnosis not present

## 2023-01-15 DIAGNOSIS — I69354 Hemiplegia and hemiparesis following cerebral infarction affecting left non-dominant side: Secondary | ICD-10-CM | POA: Diagnosis not present

## 2023-01-15 DIAGNOSIS — I69318 Other symptoms and signs involving cognitive functions following cerebral infarction: Secondary | ICD-10-CM | POA: Diagnosis not present

## 2023-01-15 DIAGNOSIS — F01511 Vascular dementia, unspecified severity, with agitation: Secondary | ICD-10-CM | POA: Diagnosis not present

## 2023-01-15 DIAGNOSIS — F0153 Vascular dementia, unspecified severity, with mood disturbance: Secondary | ICD-10-CM | POA: Diagnosis not present

## 2023-01-15 DIAGNOSIS — I69391 Dysphagia following cerebral infarction: Secondary | ICD-10-CM | POA: Diagnosis not present

## 2023-01-15 DIAGNOSIS — F0154 Vascular dementia, unspecified severity, with anxiety: Secondary | ICD-10-CM | POA: Diagnosis not present

## 2023-01-16 DIAGNOSIS — I69318 Other symptoms and signs involving cognitive functions following cerebral infarction: Secondary | ICD-10-CM | POA: Diagnosis not present

## 2023-01-16 DIAGNOSIS — F0154 Vascular dementia, unspecified severity, with anxiety: Secondary | ICD-10-CM | POA: Diagnosis not present

## 2023-01-16 DIAGNOSIS — I69354 Hemiplegia and hemiparesis following cerebral infarction affecting left non-dominant side: Secondary | ICD-10-CM | POA: Diagnosis not present

## 2023-01-16 DIAGNOSIS — F0153 Vascular dementia, unspecified severity, with mood disturbance: Secondary | ICD-10-CM | POA: Diagnosis not present

## 2023-01-16 DIAGNOSIS — F01511 Vascular dementia, unspecified severity, with agitation: Secondary | ICD-10-CM | POA: Diagnosis not present

## 2023-01-16 DIAGNOSIS — I69391 Dysphagia following cerebral infarction: Secondary | ICD-10-CM | POA: Diagnosis not present

## 2023-01-18 DIAGNOSIS — I69318 Other symptoms and signs involving cognitive functions following cerebral infarction: Secondary | ICD-10-CM | POA: Diagnosis not present

## 2023-01-19 DIAGNOSIS — I69318 Other symptoms and signs involving cognitive functions following cerebral infarction: Secondary | ICD-10-CM | POA: Diagnosis not present

## 2023-01-20 DIAGNOSIS — I69318 Other symptoms and signs involving cognitive functions following cerebral infarction: Secondary | ICD-10-CM | POA: Diagnosis not present

## 2023-01-21 DIAGNOSIS — I69318 Other symptoms and signs involving cognitive functions following cerebral infarction: Secondary | ICD-10-CM | POA: Diagnosis not present

## 2023-01-22 DIAGNOSIS — I69318 Other symptoms and signs involving cognitive functions following cerebral infarction: Secondary | ICD-10-CM | POA: Diagnosis not present

## 2023-01-23 DIAGNOSIS — I69318 Other symptoms and signs involving cognitive functions following cerebral infarction: Secondary | ICD-10-CM | POA: Diagnosis not present

## 2023-01-24 DIAGNOSIS — I69318 Other symptoms and signs involving cognitive functions following cerebral infarction: Secondary | ICD-10-CM | POA: Diagnosis not present

## 2023-01-25 DIAGNOSIS — I69318 Other symptoms and signs involving cognitive functions following cerebral infarction: Secondary | ICD-10-CM | POA: Diagnosis not present

## 2023-01-26 DIAGNOSIS — I69318 Other symptoms and signs involving cognitive functions following cerebral infarction: Secondary | ICD-10-CM | POA: Diagnosis not present

## 2023-01-27 DIAGNOSIS — I69318 Other symptoms and signs involving cognitive functions following cerebral infarction: Secondary | ICD-10-CM | POA: Diagnosis not present

## 2023-01-28 DIAGNOSIS — I69318 Other symptoms and signs involving cognitive functions following cerebral infarction: Secondary | ICD-10-CM | POA: Diagnosis not present

## 2023-01-29 DIAGNOSIS — I69318 Other symptoms and signs involving cognitive functions following cerebral infarction: Secondary | ICD-10-CM | POA: Diagnosis not present

## 2023-01-30 DIAGNOSIS — I69318 Other symptoms and signs involving cognitive functions following cerebral infarction: Secondary | ICD-10-CM | POA: Diagnosis not present

## 2023-01-31 DIAGNOSIS — I69318 Other symptoms and signs involving cognitive functions following cerebral infarction: Secondary | ICD-10-CM | POA: Diagnosis not present

## 2023-02-01 DIAGNOSIS — I69318 Other symptoms and signs involving cognitive functions following cerebral infarction: Secondary | ICD-10-CM | POA: Diagnosis not present

## 2023-02-02 DIAGNOSIS — I69318 Other symptoms and signs involving cognitive functions following cerebral infarction: Secondary | ICD-10-CM | POA: Diagnosis not present

## 2023-02-03 DIAGNOSIS — I69318 Other symptoms and signs involving cognitive functions following cerebral infarction: Secondary | ICD-10-CM | POA: Diagnosis not present

## 2023-02-04 DIAGNOSIS — I69318 Other symptoms and signs involving cognitive functions following cerebral infarction: Secondary | ICD-10-CM | POA: Diagnosis not present

## 2023-02-05 ENCOUNTER — Ambulatory Visit: Payer: Medicare Other | Admitting: Neurology

## 2023-02-05 DIAGNOSIS — I69318 Other symptoms and signs involving cognitive functions following cerebral infarction: Secondary | ICD-10-CM | POA: Diagnosis not present

## 2023-02-06 DIAGNOSIS — I69318 Other symptoms and signs involving cognitive functions following cerebral infarction: Secondary | ICD-10-CM | POA: Diagnosis not present

## 2023-02-07 DIAGNOSIS — I69318 Other symptoms and signs involving cognitive functions following cerebral infarction: Secondary | ICD-10-CM | POA: Diagnosis not present

## 2023-02-08 DIAGNOSIS — I69318 Other symptoms and signs involving cognitive functions following cerebral infarction: Secondary | ICD-10-CM | POA: Diagnosis not present

## 2023-02-09 DIAGNOSIS — I69318 Other symptoms and signs involving cognitive functions following cerebral infarction: Secondary | ICD-10-CM | POA: Diagnosis not present

## 2023-02-10 DIAGNOSIS — I69318 Other symptoms and signs involving cognitive functions following cerebral infarction: Secondary | ICD-10-CM | POA: Diagnosis not present

## 2023-02-11 DIAGNOSIS — I69318 Other symptoms and signs involving cognitive functions following cerebral infarction: Secondary | ICD-10-CM | POA: Diagnosis not present

## 2023-02-12 DIAGNOSIS — I69318 Other symptoms and signs involving cognitive functions following cerebral infarction: Secondary | ICD-10-CM | POA: Diagnosis not present

## 2023-02-13 DIAGNOSIS — I69318 Other symptoms and signs involving cognitive functions following cerebral infarction: Secondary | ICD-10-CM | POA: Diagnosis not present

## 2023-02-14 DIAGNOSIS — I69318 Other symptoms and signs involving cognitive functions following cerebral infarction: Secondary | ICD-10-CM | POA: Diagnosis not present

## 2023-02-15 DIAGNOSIS — I69318 Other symptoms and signs involving cognitive functions following cerebral infarction: Secondary | ICD-10-CM | POA: Diagnosis not present

## 2023-02-16 DIAGNOSIS — I69318 Other symptoms and signs involving cognitive functions following cerebral infarction: Secondary | ICD-10-CM | POA: Diagnosis not present

## 2023-02-17 DIAGNOSIS — I69318 Other symptoms and signs involving cognitive functions following cerebral infarction: Secondary | ICD-10-CM | POA: Diagnosis not present

## 2023-02-18 DIAGNOSIS — I69318 Other symptoms and signs involving cognitive functions following cerebral infarction: Secondary | ICD-10-CM | POA: Diagnosis not present

## 2023-02-19 DIAGNOSIS — I69318 Other symptoms and signs involving cognitive functions following cerebral infarction: Secondary | ICD-10-CM | POA: Diagnosis not present

## 2023-02-20 DIAGNOSIS — I69318 Other symptoms and signs involving cognitive functions following cerebral infarction: Secondary | ICD-10-CM | POA: Diagnosis not present

## 2023-02-21 DIAGNOSIS — I69318 Other symptoms and signs involving cognitive functions following cerebral infarction: Secondary | ICD-10-CM | POA: Diagnosis not present

## 2023-03-20 DEATH — deceased

## 2023-04-23 ENCOUNTER — Telehealth: Payer: Self-pay | Admitting: Internal Medicine

## 2023-04-23 NOTE — Telephone Encounter (Signed)
Copied from CRM [id number]. Topic: Medicare AWV >> Apr 23, 2023 11:33 AM Payton Doughty wrote: Reason for CRM: Called 04/22/2024 to sched Annual Wellness Visit - NO VOICEMAIL  Verlee Rossetti; Care Guide Ambulatory Clinical Support [place] [facility] Direct Dial: [phone]
